# Patient Record
Sex: Female | Born: 1950 | Race: White | Hispanic: No | Marital: Married | Smoking: Never smoker
Health system: Southern US, Community
[De-identification: ages and names within clinical notes are randomized; demographics above are authoritative.]

## PROBLEM LIST (undated history)

## (undated) DIAGNOSIS — G894 Chronic pain syndrome: Secondary | ICD-10-CM

## (undated) DIAGNOSIS — Z930 Tracheostomy status: Secondary | ICD-10-CM

## (undated) DIAGNOSIS — K220 Achalasia of cardia: Secondary | ICD-10-CM

## (undated) DIAGNOSIS — I639 Cerebral infarction, unspecified: Secondary | ICD-10-CM

## (undated) DIAGNOSIS — J8410 Pulmonary fibrosis, unspecified: Secondary | ICD-10-CM

## (undated) DIAGNOSIS — G43909 Migraine, unspecified, not intractable, without status migrainosus: Secondary | ICD-10-CM

## (undated) DIAGNOSIS — M1990 Unspecified osteoarthritis, unspecified site: Secondary | ICD-10-CM

## (undated) DIAGNOSIS — F419 Anxiety disorder, unspecified: Secondary | ICD-10-CM

## (undated) DIAGNOSIS — T4145XA Adverse effect of unspecified anesthetic, initial encounter: Secondary | ICD-10-CM

## (undated) DIAGNOSIS — R112 Nausea with vomiting, unspecified: Secondary | ICD-10-CM

## (undated) DIAGNOSIS — R1310 Dysphagia, unspecified: Secondary | ICD-10-CM

## (undated) DIAGNOSIS — J398 Other specified diseases of upper respiratory tract: Secondary | ICD-10-CM

## (undated) DIAGNOSIS — J449 Chronic obstructive pulmonary disease, unspecified: Secondary | ICD-10-CM

## (undated) DIAGNOSIS — B029 Zoster without complications: Secondary | ICD-10-CM

## (undated) DIAGNOSIS — J45909 Unspecified asthma, uncomplicated: Secondary | ICD-10-CM

## (undated) DIAGNOSIS — R0600 Dyspnea, unspecified: Secondary | ICD-10-CM

## (undated) DIAGNOSIS — Z8719 Personal history of other diseases of the digestive system: Secondary | ICD-10-CM

## (undated) DIAGNOSIS — Z452 Encounter for adjustment and management of vascular access device: Secondary | ICD-10-CM

## (undated) DIAGNOSIS — D649 Anemia, unspecified: Secondary | ICD-10-CM

## (undated) DIAGNOSIS — C649 Malignant neoplasm of unspecified kidney, except renal pelvis: Secondary | ICD-10-CM

## (undated) DIAGNOSIS — R569 Unspecified convulsions: Secondary | ICD-10-CM

## (undated) DIAGNOSIS — J3800 Paralysis of vocal cords and larynx, unspecified: Secondary | ICD-10-CM

## (undated) DIAGNOSIS — R600 Localized edema: Secondary | ICD-10-CM

## (undated) DIAGNOSIS — J309 Allergic rhinitis, unspecified: Secondary | ICD-10-CM

## (undated) DIAGNOSIS — E876 Hypokalemia: Secondary | ICD-10-CM

## (undated) DIAGNOSIS — T17908A Unspecified foreign body in respiratory tract, part unspecified causing other injury, initial encounter: Secondary | ICD-10-CM

## (undated) DIAGNOSIS — K589 Irritable bowel syndrome without diarrhea: Secondary | ICD-10-CM

## (undated) DIAGNOSIS — R011 Cardiac murmur, unspecified: Secondary | ICD-10-CM

## (undated) DIAGNOSIS — M5020 Other cervical disc displacement, unspecified cervical region: Secondary | ICD-10-CM

## (undated) DIAGNOSIS — T7840XA Allergy, unspecified, initial encounter: Secondary | ICD-10-CM

## (undated) DIAGNOSIS — Z91018 Allergy to other foods: Secondary | ICD-10-CM

## (undated) DIAGNOSIS — Z9889 Other specified postprocedural states: Secondary | ICD-10-CM

## (undated) DIAGNOSIS — T8859XA Other complications of anesthesia, initial encounter: Secondary | ICD-10-CM

## (undated) DIAGNOSIS — K769 Liver disease, unspecified: Secondary | ICD-10-CM

## (undated) DIAGNOSIS — K224 Dyskinesia of esophagus: Secondary | ICD-10-CM

## (undated) DIAGNOSIS — K2970 Gastritis, unspecified, without bleeding: Secondary | ICD-10-CM

## (undated) DIAGNOSIS — H269 Unspecified cataract: Secondary | ICD-10-CM

## (undated) HISTORY — PX: BACK SURGERY: SHX140

## (undated) HISTORY — DX: Migraine, unspecified, not intractable, without status migrainosus: G43.909

## (undated) HISTORY — DX: Allergic rhinitis, unspecified: J30.9

## (undated) HISTORY — DX: Irritable bowel syndrome, unspecified: K58.9

## (undated) HISTORY — DX: Allergy, unspecified, initial encounter: T78.40XA

## (undated) HISTORY — DX: Zoster without complications: B02.9

## (undated) HISTORY — PX: ABDOMINAL HYSTERECTOMY: SHX81

## (undated) HISTORY — DX: Unspecified cataract: H26.9

## (undated) HISTORY — DX: Cerebral infarction, unspecified: I63.9

## (undated) HISTORY — DX: Anemia, unspecified: D64.9

## (undated) HISTORY — PX: TUBAL LIGATION: SHX77

## (undated) HISTORY — DX: Unspecified asthma, uncomplicated: J45.909

## (undated) HISTORY — PX: CHOLECYSTECTOMY: SHX55

## (undated) HISTORY — PX: MULTIPLE TOOTH EXTRACTIONS: SHX2053

## (undated) HISTORY — DX: Cardiac murmur, unspecified: R01.1

## (undated) HISTORY — PX: OTHER SURGICAL HISTORY: SHX169

## (undated) HISTORY — PX: COLONOSCOPY: SHX174

## (undated) HISTORY — PX: JEJUNOSTOMY FEEDING TUBE: SUR737

## (undated) HISTORY — PX: OOPHORECTOMY: SHX86

## (undated) HISTORY — PX: EYE SURGERY: SHX253

## (undated) HISTORY — DX: Chronic obstructive pulmonary disease, unspecified: J44.9

## (undated) HISTORY — DX: Dyskinesia of esophagus: K22.4

## (undated) HISTORY — PX: TONSILLECTOMY: SUR1361

## (undated) HISTORY — PX: DIAGNOSTIC LAPAROSCOPY: SUR761

## (undated) HISTORY — PX: BREAST BIOPSY: SHX20

## (undated) HISTORY — DX: Other cervical disc displacement, unspecified cervical region: M50.20

## (undated) HISTORY — PX: SPINE SURGERY: SHX786

---

## 1994-03-27 HISTORY — PX: TRACHEOSTOMY: SUR1362

## 2000-03-27 HISTORY — PX: BREAST SURGERY: SHX581

## 2004-07-04 ENCOUNTER — Inpatient Hospital Stay: Payer: Self-pay | Admitting: Internal Medicine

## 2004-12-30 ENCOUNTER — Ambulatory Visit: Payer: Self-pay | Admitting: Unknown Physician Specialty

## 2005-04-06 ENCOUNTER — Ambulatory Visit: Payer: Self-pay | Admitting: Internal Medicine

## 2006-01-01 ENCOUNTER — Ambulatory Visit: Payer: Self-pay | Admitting: Otolaryngology

## 2006-01-04 ENCOUNTER — Ambulatory Visit: Payer: Self-pay | Admitting: Otolaryngology

## 2006-10-08 ENCOUNTER — Ambulatory Visit: Payer: Self-pay | Admitting: Specialist

## 2006-10-12 ENCOUNTER — Encounter: Payer: Self-pay | Admitting: Specialist

## 2006-10-26 ENCOUNTER — Encounter: Payer: Self-pay | Admitting: Specialist

## 2006-11-26 ENCOUNTER — Encounter: Payer: Self-pay | Admitting: Specialist

## 2007-09-19 ENCOUNTER — Ambulatory Visit: Payer: Self-pay | Admitting: Specialist

## 2007-10-15 ENCOUNTER — Ambulatory Visit: Payer: Self-pay | Admitting: Unknown Physician Specialty

## 2008-05-13 ENCOUNTER — Ambulatory Visit: Payer: Self-pay | Admitting: Specialist

## 2008-08-27 ENCOUNTER — Ambulatory Visit: Payer: Self-pay | Admitting: Unknown Physician Specialty

## 2008-10-15 ENCOUNTER — Ambulatory Visit: Payer: Self-pay | Admitting: Unknown Physician Specialty

## 2008-10-30 ENCOUNTER — Ambulatory Visit: Payer: Self-pay | Admitting: General Surgery

## 2008-12-13 LAB — HM COLONOSCOPY: HM Colonoscopy: NORMAL

## 2009-01-08 ENCOUNTER — Ambulatory Visit: Payer: Self-pay | Admitting: General Surgery

## 2009-01-15 ENCOUNTER — Ambulatory Visit: Payer: Self-pay | Admitting: General Surgery

## 2009-02-07 ENCOUNTER — Emergency Department: Payer: Self-pay | Admitting: Surgery

## 2009-02-08 ENCOUNTER — Ambulatory Visit: Payer: Self-pay | Admitting: General Surgery

## 2009-02-09 ENCOUNTER — Ambulatory Visit: Payer: Self-pay | Admitting: General Surgery

## 2009-10-18 ENCOUNTER — Ambulatory Visit: Payer: Self-pay | Admitting: Unknown Physician Specialty

## 2009-10-20 ENCOUNTER — Ambulatory Visit: Payer: Self-pay | Admitting: Unknown Physician Specialty

## 2009-12-14 ENCOUNTER — Ambulatory Visit: Payer: Self-pay | Admitting: Family Medicine

## 2010-12-08 ENCOUNTER — Ambulatory Visit: Payer: Self-pay | Admitting: Obstetrics & Gynecology

## 2010-12-14 LAB — HM PAP SMEAR

## 2011-05-05 ENCOUNTER — Ambulatory Visit: Payer: Self-pay | Admitting: Specialist

## 2011-06-14 ENCOUNTER — Institutional Professional Consult (permissible substitution): Payer: Self-pay | Admitting: Pulmonary Disease

## 2011-12-14 LAB — HM MAMMOGRAPHY

## 2012-02-26 ENCOUNTER — Ambulatory Visit: Payer: Self-pay | Admitting: Ophthalmology

## 2012-02-26 LAB — POTASSIUM: Potassium: 3.3 mmol/L — ABNORMAL LOW (ref 3.5–5.1)

## 2012-03-27 DIAGNOSIS — H269 Unspecified cataract: Secondary | ICD-10-CM

## 2012-03-27 HISTORY — DX: Unspecified cataract: H26.9

## 2012-04-03 ENCOUNTER — Ambulatory Visit: Payer: Self-pay | Admitting: Ophthalmology

## 2012-04-03 LAB — POTASSIUM: Potassium: 3.6 mmol/L (ref 3.5–5.1)

## 2012-04-08 ENCOUNTER — Ambulatory Visit: Payer: Self-pay | Admitting: Ophthalmology

## 2012-06-25 ENCOUNTER — Ambulatory Visit: Payer: Self-pay | Admitting: Otolaryngology

## 2012-06-25 LAB — T4, FREE: Free Thyroxine: 0.97 ng/dL (ref 0.76–1.46)

## 2012-06-25 LAB — TSH: Thyroid Stimulating Horm: 0.453 u[IU]/mL

## 2012-06-27 ENCOUNTER — Telehealth: Payer: Self-pay | Admitting: Pulmonary Disease

## 2012-06-27 NOTE — Telephone Encounter (Signed)
I spoke with the pt and she states she currently sees pulmonologist in [place] but feels that he is just too busy and feels like it is time to change. She states she has a close friend that is a pt of Dr. Corey Skains and she speaks highly of him so she would like to schedule an appt with him. 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 She wanted to make sure this is a case that Dr. Kendrick Fries would want to take on and would like to set an appt to meet him and discuss her issues and decide if she wants to transfer her care at that time. Appt set for 07-23-12. Carron Curie, CMA

## 2012-07-23 ENCOUNTER — Institutional Professional Consult (permissible substitution): Payer: Self-pay | Admitting: Pulmonary Disease

## 2012-08-05 ENCOUNTER — Ambulatory Visit (INDEPENDENT_AMBULATORY_CARE_PROVIDER_SITE_OTHER): Payer: Medicare Other | Admitting: Pulmonary Disease

## 2012-08-05 ENCOUNTER — Encounter: Payer: Self-pay | Admitting: Pulmonary Disease

## 2012-08-05 VITALS — BP 100/62 | HR 81 | Temp 98.0°F | Ht 63.5 in | Wt 104.0 lb

## 2012-08-05 DIAGNOSIS — Z930 Tracheostomy status: Secondary | ICD-10-CM | POA: Insufficient documentation

## 2012-08-05 DIAGNOSIS — J4489 Other specified chronic obstructive pulmonary disease: Secondary | ICD-10-CM | POA: Insufficient documentation

## 2012-08-05 DIAGNOSIS — J449 Chronic obstructive pulmonary disease, unspecified: Secondary | ICD-10-CM

## 2012-08-05 DIAGNOSIS — Z8709 Personal history of other diseases of the respiratory system: Secondary | ICD-10-CM

## 2012-08-05 DIAGNOSIS — R0602 Shortness of breath: Secondary | ICD-10-CM

## 2012-08-05 DIAGNOSIS — J8410 Pulmonary fibrosis, unspecified: Secondary | ICD-10-CM

## 2012-08-05 NOTE — Patient Instructions (Signed)
We will send you to [facility] for a CT scan to look at the scarring in your lungs  Keep taking your medications as you are doing  We will request records from Dr. Delfin Edis and Dr. Mayo Ao  We will see you back in 4-6 weeks

## 2012-08-05 NOTE — Assessment & Plan Note (Addendum)
" >>  ASSESSMENT AND PLAN FOR ASTHMA-COPD OVERLAP SYNDROME (HCC) WRITTEN ON 08/05/2012  1:03 PM BY Christiane Sistare B, MD  She has been given this diagnosis and takes Symbicort  and Singulair  for it.  It sounds as if she has recurrent episodes of wheezing which would be consistent with a flare of COPD and asthma. This is very difficult to assess, because she has a tracheostomy and pulmonary function testing cannot be performed.  Plan:  -for now continue Symbicort , Singulair , and albuterol  -obtain records from her prior pulmonologist   >>ASSESSMENT AND PLAN FOR PULMONARY FIBROSIS (HCC) WRITTEN ON 08/05/2012  1:02 PM BY Jessenya Berdan B, MD  In addition to COPD, and her vocal cord abnormality it appears that Anne [name] had evidence of pulmonary fibrosis seen on a CT chest performed in February 2013. She is unaware of the workup is been a performed since then. Clearly, we are limited in that we cannot perform pulmonary function testing given her tracheostomy. However it does bother me that she has become more short of breath in the last year and we have this objective abnormality.  She was told that the changes in her lung likely represent congestion or an infection. I hope that this is the case, however I worry that this could represent pulmonary fibrosis such as hypersensitivity pneumonitis, chronic eosinophilic pneumonia, or NSIP.  Plan: -Repeat CT chest now -if evidence of fibrosis, then order hypersensitivity pneumonitis panel as well as fibrosis serologies "

## 2012-08-05 NOTE — Assessment & Plan Note (Signed)
In addition to COPD, and her vocal cord abnormality it appears that Anne [name] had evidence of pulmonary fibrosis seen on a CT chest performed in February 2013. She is unaware of the workup is been a performed since then. Clearly, we are limited in that we cannot perform pulmonary function testing given her tracheostomy. However it does bother me that she has become more short of breath in the last year and we have this objective abnormality.  She was told that the changes in her lung likely represent congestion or an infection. I hope that this is the case, however I worry that this could represent pulmonary fibrosis such as hypersensitivity pneumonitis, chronic eosinophilic pneumonia, or NSIP.  Plan: -Repeat CT chest now -if evidence of fibrosis, then order hypersensitivity pneumonitis panel as well as fibrosis serologies

## 2012-08-05 NOTE — Progress Notes (Signed)
Subjective:    Patient ID: Anne [name], female    DOB: 1950-05-07, 62 y.o.   MRN: [id number]  HPI  This is a very pleasant 62 year old female who comes to our clinic today to establish care for asthma, COPD, questionable pulmonary fibrosis, and a chronic tracheostomy. She says that she felt that she had asthma as a child but she was never diagnosed. She is a never smoker but her mother smoked a lot in the house. She had recurrent episodes of chest tightness and wheezing which are change with various environmental exposures as a child. Throughout adulthood the symptoms worsened and about 20 years ago she started developing worsening shortness of breath. On one visit with the pulmonologist she presented with acute shortness of breath and apparently had stridor. This led to the emergent placement of the tracheostomy. That was 17 years ago and the tracheostomy has stayed since. 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 She's had a capped, cuff was, #4 Shiley in Pl. for what sounds like quite a long time. She changes it once a month and 3-4 times a year she needs to an It at night for shortness of breath. She has flares of asthma 2-3 times a year which are treated with prednisone tapers. She takes Symbicort, albuterol, and Singulair which all help with her shortness of breath. She states that whenever she is exposed to pollen, Clorox, smoke or some perfumes she will get short of breath. She has a dry cough which is rarely productive of sputum.  She tells me that she thinks that her shortness of breath with exertion has gotten worse over the last year. She tries to stay active and exercises regularly but she states that she doesn't feel she has the energy that she used to.   Past Medical History  Diagnosis Date  . Asthma   . COPD (chronic obstructive pulmonary disease)   . Stroke   . Migraine   . Allergic rhinitis      Family History  Problem  Relation Age of Onset  . Asthma Cousin   . COPD Cousin      History   Social History  . Marital Status: Married    Spouse Name: N/A    Number of Children: N/A  . Years of Education: N/A   Occupational History  . Not on file.   Social History Main Topics  . Smoking status: Never Smoker   . Smokeless tobacco: Never Used  . Alcohol Use: No  . Drug Use: No  . Sexually Active: Not on file   Other Topics Concern  . Not on file   Social History Narrative  . No narrative on file     Allergies  Allergen Reactions  . Asa (Aspirin)   . Levaquin (Levofloxacin In D5w)      No outpatient prescriptions prior to visit.   No facility-administered medications prior to visit.      Review of Systems  Constitutional: Positive for unexpected weight change. Negative for fever.  HENT: Positive for sneezing and dental problem. Negative for ear pain, nosebleeds, congestion, sore throat, rhinorrhea, trouble swallowing, postnasal drip and sinus pressure.   Eyes: Negative for redness and itching.  Respiratory: Positive for cough and shortness of breath. Negative for chest tightness and wheezing.   Cardiovascular: Positive for palpitations. Negative for leg swelling.  Gastrointestinal: Positive for abdominal pain. Negative for nausea and vomiting.  Genitourinary: Negative for dysuria.  Musculoskeletal: Positive for  joint swelling.  Skin: Negative for rash.  Neurological: Positive for headaches.  Hematological: Does not bruise/bleed easily.  Psychiatric/Behavioral: Negative for dysphoric mood. The patient is not nervous/anxious.        Objective:   Physical Exam  Filed Vitals:   08/05/12 1012  BP: 100/62  Pulse: 81  Temp: 98 F (36.7 C)  TempSrc: Oral  Height: 5' 3.5" (1.613 m)  Weight: 104 lb (47.174 kg)  SpO2: 99%   Gen: well appearing, no acute distress HEENT: NCAT, PERRL, EOMi, OP clear, trach site c/d/i; #4 capped, cuffless shiley in place PULM: wheezing in upper  lobes bilaterally CV: RRR, no mgr, no JVD AB: BS+, soft, nontender, no hsm Ext: warm, no edema, no clubbing, no cyanosis Derm: no rash or skin breakdown Neuro: A&Ox4, CN II-XII intact, strength 5/5 in all 4 extremities  04/[facility]>> emphysema bilaterally; upper lobe groundglass opacification, interstitial thickening, interlobular thickening, and apical cappingnoted bilaterally. This is significantly worsened since the 2007 comparison study. In       Assessment & Plan:   Postinflammatory pulmonary fibrosis In addition to COPD, and her vocal cord abnormality it appears that [name] had evidence of pulmonary fibrosis seen on a CT chest performed in February 2013. She is unaware of the workup is been a performed since then. Clearly, we are limited in that we cannot perform pulmonary function testing given her tracheostomy. However it does bother me that she has become more short of breath in the last year and we have this objective abnormality.  She was told that the changes in her lung likely represent congestion or an infection. I hope that this is the case, however I worry that this could represent pulmonary fibrosis such as hypersensitivity pneumonitis, chronic eosinophilic pneumonia, or NSIP.  Plan: -Repeat CT chest now -if evidence of fibrosis, then order hypersensitivity pneumonitis panel as well as fibrosis serologies  COPD with asthma She has been given this diagnosis and takes Symbicort and Singulair for it.  It sounds as if she has recurrent episodes of wheezing which would be consistent with a flare of COPD and asthma. This is very difficult to assess, because she has a tracheostomy and pulmonary function testing cannot be performed.  Plan:  -for now continue Symbicort, Singulair, and albuterol -obtain records from her prior pulmonologist  Tracheostomy dependent It is not entirely clear to me why she had the tracheostomy placed 17 years  ago, however it sounds as if it occurred in the midst of an episode of stridor and near respiratory failure. She describes a vocal cord problem with which I am not familiar.  Plan: -Continue tracheostomy changes that she is doing -Obtain records from [facility] for further clarification   Updated Medication List Outpatient Encounter Prescriptions as of 08/05/2012  Medication Sig Dispense Refill  . albuterol (PROVENTIL HFA;VENTOLIN HFA) 108 (90 BASE) MCG/ACT inhaler Inhale 2 puffs into the lungs every 6 (six) hours as needed for wheezing.      [name] ALPRAZolam (XANAX) 0.25 MG tablet Take 0.25 mg by mouth 2 (two) times daily as needed for sleep.      . Artificial Tear Ointment (ARTIFICIAL TEARS) ointment as needed.      [name] azelastine (OPTIVAR) 0.05 % ophthalmic solution Place 1 drop into both eyes 2 (two) times daily.      . budesonide-formoterol (SYMBICORT) 80-4.5 MCG/ACT inhaler Inhale 2 puffs into the lungs 2 (two) times daily.      . calcium  carbonate (OS-CAL) 600 MG TABS Take 600 mg by mouth daily.      . cetirizine (ZYRTEC) 10 MG tablet Take 10 mg by mouth daily.      . chlorpheniramine-HYDROcodone (TUSSIONEX) 10-8 MG/5ML LQCR Take 5 mLs by mouth every 12 (twelve) hours as needed.      . clopidogrel (PLAVIX) 75 MG tablet Take 75 mg by mouth daily.      . Cyanocobalamin (VITAMIN B-12 IJ) Inject as directed every 30 (thirty) days.      [name] estrogens, conjugated, (PREMARIN) 0.625 MG tablet Take 0.625 mg by mouth daily. Take daily for 21 days then do not take for 7 days.      . fluticasone (FLONASE) 50 MCG/ACT nasal spray Place 2 sprays into the nose daily.      . furosemide (LASIX) 20 MG tablet Take 20 mg by mouth daily.      . magnesium oxide (MAG-OX) 400 MG tablet Take 400 mg by mouth daily.      . montelukast (SINGULAIR) 10 MG tablet Take 10 mg by mouth at bedtime.      . potassium chloride SA (K-DUR,KLOR-CON) 20 MEQ tablet Take 20 mEq by mouth 2 (two) times daily as needed.      . sodium  chloride (MURO 128) 5 % ophthalmic ointment 1 drop.       No facility-administered encounter medications on file as of 08/05/2012.

## 2012-08-05 NOTE — Assessment & Plan Note (Signed)
It is not entirely clear to me why she had the tracheostomy placed 17 years ago, however it sounds as if it occurred in the midst of an episode of stridor and near respiratory failure. She describes a vocal cord problem with which I am not familiar.  Plan: -Continue tracheostomy changes that she is doing -Obtain records from [facility] for further clarification

## 2012-08-06 ENCOUNTER — Encounter: Payer: Self-pay | Admitting: Pulmonary Disease

## 2012-08-08 ENCOUNTER — Telehealth: Payer: Self-pay | Admitting: Pulmonary Disease

## 2012-08-08 ENCOUNTER — Ambulatory Visit: Payer: Self-pay | Admitting: Pulmonary Disease

## 2012-08-08 NOTE — Telephone Encounter (Signed)
Received 20 pages from Dr. Meredeth Ide at [facility] on 08/08/2012 asw  Sent 20 pages to Dr. Kendrick Fries to review on 08/08/2012 asw

## 2012-08-08 NOTE — Telephone Encounter (Signed)
Dr Kendrick Fries, I have not received the results yet, but can you view on the [facility] pacs system and advise on results please? Thanks!

## 2012-08-12 NOTE — Telephone Encounter (Signed)
Last OV 08-05-12. Pt was calling for 2 things: 1) CT results, I advised we have a message into Dr. Kendrick Fries for this and he is in the office tomorrow so she should get a call them.  2) Pt c/o having increased chest congestion, body aches, headache, dry cough, chest congestion, wheezing, SOB and pain in back at "lungs" x 2 days, no appts available in [place]. Pt states she cannot take tablets well so she requests liquid RX.  Please advise. Carron Curie, CMA Allergies  Allergen Reactions  . Asa (Aspirin)   . Levaquin (Levofloxacin In D5w)

## 2012-08-12 NOTE — Telephone Encounter (Signed)
Spoke with pt and have scheduled her to see Dr. Kendrick Fries tomorrow at 10:45 am I advised that should her symptoms persist/worsen seek emergent care sooner and she verbalized understanding

## 2012-08-12 NOTE — Telephone Encounter (Signed)
Can't treat this many problems over the phone - can try mucinex dm 1200 mg bid but need to get her here this pm or in am in [facility] 5/20

## 2012-08-12 NOTE — Telephone Encounter (Signed)
Pt calling again for ct results also c/o sob, chest congestion, says it hurts to breathe, also very achy x2 days please advise.//[facility] [facility].Raylene Everts

## 2012-08-12 NOTE — Telephone Encounter (Signed)
[email or web address].Roniel Halloran, CMA  

## 2012-08-13 ENCOUNTER — Ambulatory Visit (INDEPENDENT_AMBULATORY_CARE_PROVIDER_SITE_OTHER): Payer: Medicare Other | Admitting: Pulmonary Disease

## 2012-08-13 ENCOUNTER — Encounter: Payer: Self-pay | Admitting: Pulmonary Disease

## 2012-08-13 VITALS — BP 102/60 | HR 98 | Temp 98.1°F | Ht 63.5 in | Wt 103.8 lb

## 2012-08-13 DIAGNOSIS — J8410 Pulmonary fibrosis, unspecified: Secondary | ICD-10-CM

## 2012-08-13 DIAGNOSIS — D649 Anemia, unspecified: Secondary | ICD-10-CM | POA: Insufficient documentation

## 2012-08-13 DIAGNOSIS — R918 Other nonspecific abnormal finding of lung field: Secondary | ICD-10-CM | POA: Insufficient documentation

## 2012-08-13 DIAGNOSIS — J449 Chronic obstructive pulmonary disease, unspecified: Secondary | ICD-10-CM

## 2012-08-13 LAB — CBC WITH DIFFERENTIAL/PLATELET
Basophils Absolute: 0 10*3/uL (ref 0.0–0.1)
Basophils Relative: 0.2 % (ref 0.0–3.0)
Eosinophils Absolute: 0.1 10*3/uL (ref 0.0–0.7)
Eosinophils Relative: 1.2 % (ref 0.0–5.0)
HCT: 33.3 % — ABNORMAL LOW (ref 36.0–46.0)
Hemoglobin: 11.4 g/dL — ABNORMAL LOW (ref 12.0–15.0)
Lymphocytes Relative: 11.6 % — ABNORMAL LOW (ref 12.0–46.0)
Lymphs Abs: 1 10*3/uL (ref 0.7–4.0)
MCHC: 34.2 g/dL (ref 30.0–36.0)
MCV: 93.3 fl (ref 78.0–100.0)
Monocytes Absolute: 0.4 10*3/uL (ref 0.1–1.0)
Monocytes Relative: 5.2 % (ref 3.0–12.0)
Neutro Abs: 6.8 10*3/uL (ref 1.4–7.7)
Neutrophils Relative %: 81.8 % — ABNORMAL HIGH (ref 43.0–77.0)
Platelets: 249 10*3/uL (ref 150.0–400.0)
RBC: 3.57 Mil/uL — ABNORMAL LOW (ref 3.87–5.11)
RDW: 13.4 % (ref 11.5–14.6)
WBC: 8.4 10*3/uL (ref 4.5–10.5)

## 2012-08-13 LAB — SEDIMENTATION RATE: Sed Rate: 55 mm/hr — ABNORMAL HIGH (ref 0–22)

## 2012-08-13 LAB — C-REACTIVE PROTEIN: CRP: 10.6 mg/dL (ref 0.5–20.0)

## 2012-08-13 MED ORDER — PREDNISONE 10 MG PO TABS: ORAL_TABLET | ORAL | Status: DC

## 2012-08-13 MED ORDER — AMOXICILLIN-POT CLAVULANATE 875-125 MG PO TABS: 1.0000 | ORAL_TABLET | Freq: Two times a day (BID) | ORAL | Status: AC

## 2012-08-13 NOTE — Assessment & Plan Note (Signed)
See the description of the chest CT of the above. She has clusters of pulmonary nodules in the right lower lobe which I think represent either aspiration pneumonitis, or mucus plugging related to an infection. Much less likely would be a fungal infectio.  Plan: -Treat with antibiotics and repeat CT scan in 8-12 weeks -Check serologies for ABPA and cryptococcosis

## 2012-08-13 NOTE — Patient Instructions (Signed)
Take the augmentin as written Take the prednisone as written  We will see you back in 3 weeks to see how you are doing.  Let us know if you are not better before then  You will need a repeat CT scan of your chest in 2-3 months (8-12 weeks) and a Chest X-ray with the next visit.

## 2012-08-13 NOTE — Progress Notes (Signed)
Subjective:    Patient ID: Anne [name], female    DOB: 1950/12/05, 62 y.o.   MRN: [id number]  Synopsis: She enjoys establish care with the [facility] pulmonary clinic in may of 2014. 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 A CT scan in 2013 performed at [facility] showed some groundglass changes, interstitial thickening, and interlobular thickening in the upper lobes predominantly.  HPI  08/13/2012 ROV >> Anne [name] has had chest congestion, cough, and increasing shortness of breath since Saturday. She has been taking Mucinex for this but it does not help. This is been associated with muscle aches, and fatigue. She has multiple joint aches, and states that her ring finger PIP has been swollen for the last several weeks. She also notes a new rash on her face which is red. She came to our clinic today as a sick visit but also would like to followup the results of the CT scan which was performed last week.  Past Medical History  Diagnosis Date  . Asthma   . COPD (chronic obstructive pulmonary disease)   . Stroke   . Migraine   . Allergic rhinitis      Review of Systems  Constitutional: Positive for chills and fatigue. Negative for fever.  HENT: Negative for congestion, rhinorrhea and postnasal drip.   Respiratory: Positive for cough, chest tightness and shortness of breath.   Cardiovascular: Negative for chest pain, palpitations and leg swelling.       Objective:   Physical Exam Filed Vitals:   08/13/12 1108  BP: 102/60  Pulse: 98  Temp: 98.1 F (36.7 C)  TempSrc: Oral  Height: 5' 3.5" (1.613 m)   Weight: 103 lb 12.8 oz (47.083 kg)  SpO2: 99%   Gen:  no acute distress HEENT: NCAT, EOMi, OP clear, trach site c/d/i, no drainage PULM: exp wheezing bilaterally worse in upper lobes, few crackls CV: RRR, no mgr, no JVD AB: BS+, soft, nontender, no hsm Ext: warm, no edema, no clubbing, no cyanosis Derm: erythematous rash over cheeks bilaterally MSK: no joint effusion in hands, elbows, ankles, or knees  04/2011 CT [facility] at [facility]>> emphysema bilaterally; upper lobe groundglass opacification, interstitial thickening, interlobular thickening, and apical cappingnoted bilaterally. This is significantly worsened since the 2007 comparison study. 07/2012 CT chest [facility] >> upper lobe interstitial thickening, consolidation, peripepheral and apical in distribution slightly increased from prior; also new clusters of ggo nodules in the RLL      Assessment & Plan:   COPD with asthma She is having a flare of her COPD, though I question whether or not she could have aspiration pneumonia versus pneumonitis.  Plan: -Augmentin for 7 days -Prednisone -Continue inhalers as written  Postinflammatory pulmonary fibrosis Her fibrosis appears to have progressed somewhat since the last study which was performed in 2013. Given the fact that it is an upper lobe predominant disease makes me consider chronic eosinophilic pneumonitis versus hypersensitivity pneumonitis. I also questioned whether or not there is an underlying connective tissue disease which could be contributing as she has multiple myalgias, arthralgias, and an erythematous rash on her face.  Plan: -  Serology to be sent today for hypersensitivity pneumonitis as well as connective tissue diseases. -Also send CBC with differential to look for elevated eosinophil count  Multiple pulmonary nodules See the description of the chest CT of the above. She has clusters of pulmonary nodules in the right lower lobe which I think  represent either aspiration pneumonitis, or mucus plugging related to an infection. Much less likely would be a fungal infectio.  Plan: -Treat with antibiotics and repeat CT scan in 8-12 weeks -Check serologies for ABPA and cryptococcosis   Updated Medication List Outpatient Encounter Prescriptions as of 08/13/2012  Medication Sig Dispense Refill  . albuterol (PROVENTIL HFA;VENTOLIN HFA) 108 (90 BASE) MCG/ACT inhaler Inhale 2 puffs into the lungs every 6 (six) hours as needed for wheezing.      [name] ALPRAZolam (XANAX) 0.25 MG tablet Take 0.25 mg by mouth 2 (two) times daily as needed for sleep.      . Artificial Tear Ointment (ARTIFICIAL TEARS) ointment as needed.      [name] azelastine (OPTIVAR) 0.05 % ophthalmic solution Place 1 drop into both eyes 2 (two) times daily.      . cetirizine (ZYRTEC) 10 MG tablet Take 10 mg by mouth daily.      . chlorpheniramine-HYDROcodone (TUSSIONEX) 10-8 MG/5ML LQCR Take 5 mLs by mouth every 12 (twelve) hours as needed.      . clopidogrel (PLAVIX) 75 MG tablet Take 75 mg by mouth daily.      . Cyanocobalamin (VITAMIN B-12 IJ) Inject as directed every 30 (thirty) days.      [name] estrogens, conjugated, (PREMARIN) 0.625 MG tablet Take 0.625 mg by mouth daily. Take daily for 21 days then do not take for 7 days.      . fluticasone (FLONASE) 50 MCG/ACT nasal spray Place 2 sprays into the nose as needed.       . furosemide (LASIX) 20 MG tablet Take 20 mg by mouth daily.      . montelukast (SINGULAIR) 10 MG tablet Take 10 mg by mouth at bedtime.      [name] amoxicillin-clavulanate (AUGMENTIN) 875-125 MG per tablet Take 1 tablet by mouth 2 (two) times daily.  28 tablet  0  . predniSONE (DELTASONE) 10 MG tablet 30mg  daily for 3 days, then 20mg  daily for 3 days, then off  30 tablet  0  . [DISCONTINUED] budesonide-formoterol (SYMBICORT) 80-4.5 MCG/ACT inhaler Inhale 2 puffs into the lungs 2 (two) times daily.      . [DISCONTINUED] calcium carbonate (OS-CAL) 600 MG TABS Take 600 mg by  mouth daily.      . [DISCONTINUED] magnesium oxide (MAG-OX) 400 MG tablet Take 400 mg by mouth daily.      . [DISCONTINUED] potassium chloride SA (K-DUR,KLOR-CON) 20 MEQ tablet Take 20 mEq by mouth 2 (two) times daily as needed.      . [DISCONTINUED] sodium chloride (MURO 128) 5 % ophthalmic ointment 1 drop.       No facility-administered encounter medications on file as of 08/13/2012.

## 2012-08-13 NOTE — Assessment & Plan Note (Addendum)
" >>  ASSESSMENT AND PLAN FOR ASTHMA-COPD OVERLAP SYNDROME (HCC) WRITTEN ON 08/13/2012  1:17 PM BY Micky Sheller B, MD  She is having a flare of her COPD, though I question whether or not she could have aspiration pneumonia versus pneumonitis.  Plan: -Augmentin  for 7 days -Prednisone  -Continue inhalers as written   >>ASSESSMENT AND PLAN FOR PULMONARY FIBROSIS (HCC) WRITTEN ON 08/13/2012  1:18 PM BY Judie Hollick B, MD  Her fibrosis appears to have progressed somewhat since the last study which was performed in 2013. Given the fact that it is an upper lobe predominant disease makes me consider chronic eosinophilic pneumonitis versus hypersensitivity pneumonitis. I also questioned whether or not there is an underlying connective tissue disease which could be contributing as she has multiple myalgias, arthralgias, and an erythematous rash on her face.  Plan: -Serology to be sent today for hypersensitivity pneumonitis as well as connective tissue diseases. -Also send CBC with differential to look for elevated eosinophil count "

## 2012-08-13 NOTE — Assessment & Plan Note (Signed)
Her fibrosis appears to have progressed somewhat since the last study which was performed in 2013. Given the fact that it is an upper lobe predominant disease makes me consider chronic eosinophilic pneumonitis versus hypersensitivity pneumonitis. I also questioned whether or not there is an underlying connective tissue disease which could be contributing as she has multiple myalgias, arthralgias, and an erythematous rash on her face.  Plan: -Serology to be sent today for hypersensitivity pneumonitis as well as connective tissue diseases. -Also send CBC with differential to look for elevated eosinophil count

## 2012-08-14 ENCOUNTER — Encounter: Payer: Self-pay | Admitting: Pulmonary Disease

## 2012-08-14 LAB — IGE: IgE (Immunoglobulin E), Serum: 3.1 IU/mL (ref 0.0–180.0)

## 2012-08-14 LAB — ANTI-SCLERODERMA ANTIBODY: Scleroderma (Scl-70) (ENA) Antibody, IgG: <1 AU/mL (ref <30)

## 2012-08-14 LAB — ANA: Anti Nuclear Antibody(ANA): NEGATIVE

## 2012-08-14 LAB — RHEUMATOID FACTOR: Rhuematoid fact SerPl-aCnc: <10 IU/mL (ref <=14)

## 2012-08-14 LAB — ANTI-JO 1 ANTIBODY, IGG: Anti JO-1: <0.2 AI (ref 0.0–0.9)

## 2012-08-14 LAB — SJOGRENS SYNDROME-B EXTRACTABLE NUCLEAR ANTIBODY: SSB (La) (ENA) Antibody, IgG: <1 AU/mL (ref <30)

## 2012-08-14 LAB — CRYPTOCOCCAL ANTIGEN: Crypto Ag: NEGATIVE

## 2012-08-14 LAB — SJOGRENS SYNDROME-A EXTRACTABLE NUCLEAR ANTIBODY: SSA (Ro) (ENA) Antibody, IgG: 4 AU/mL (ref <30)

## 2012-08-15 LAB — ALDOLASE: Aldolase: 3.7 U/L (ref <=8.1)

## 2012-08-17 LAB — ASPERGILLUS ANTIBODY (COMPLEMENT FIX): Aspergillus Antibody by Complement Fix: <1:8 {titer}

## 2012-08-19 LAB — ASPERGILLUS ANTIBODY BY IMMUNODIFF: Aspergillus Antibody ID: NEGATIVE

## 2012-08-20 LAB — HYPERSENSITIVITY PNUEMONITIS PROFILE

## 2012-08-21 ENCOUNTER — Encounter: Payer: Self-pay | Admitting: Pulmonary Disease

## 2012-08-22 ENCOUNTER — Telehealth: Payer: Self-pay | Admitting: Pulmonary Disease

## 2012-08-22 LAB — ASPERGILLUS IGE PANEL

## 2012-08-22 NOTE — Telephone Encounter (Signed)
i spoke with pt. She stated she wants to know if it is okay to use her albuterol neb. i advised her directions are every 6 hrs prn. She did not want to make an appt at this time. She stated if the albuterol neb did not help then she will call back for appt. Nothing further was needed

## 2012-08-28 ENCOUNTER — Telehealth: Payer: Self-pay | Admitting: Pulmonary Disease

## 2012-08-28 ENCOUNTER — Encounter: Payer: Self-pay | Admitting: Pulmonary Disease

## 2012-08-28 DIAGNOSIS — R918 Other nonspecific abnormal finding of lung field: Secondary | ICD-10-CM

## 2012-08-28 NOTE — Telephone Encounter (Signed)
Spoke with pt.  She is aware cxr order placed for [facility] office to have done prior to Tuesday's OV with BQ.   BQ ordered labs on pt on 08/13/12.  A CBC was ordered.  Pt has looked at these results in MyChart and is concerned about being anemic given her RBC, Hgb, and Hct levels.   RBC = 3.5 Hgb = 11.4 Hct 33.3  Pt states she doesn't have a PCP dr right now.  She would like BQ to address this as he ordered them and she doesn't have a PCP.  Would like to know if she needs to pick up something to take?  BQ, pls advise.  Thank you.  ** Pt aware BQ is night float.

## 2012-08-28 NOTE — Telephone Encounter (Signed)
She needs a PCP. Tell her to call the [facility] office and ask for an appointment with one of the providers.  Her anemia is mild and similar to other values I saw from her records from [place].  She needs further labs to work it up, but it is not emergent and can be handled by her new PCP.  At this point what I would recommend is a repeat cbc within 3 months at a minimum.

## 2012-08-28 NOTE — Telephone Encounter (Signed)
Called, spoke with pt.  She was seen by BQ on 08/13/12 with the following pt instructions:  Patient Instructions    Take the augmentin as written  Take the prednisone as written  We will see you back in 3 weeks to see how you are doing. Let us know if you are not better before then  You will need a repeat CT scan of your chest in 2-3 months (8-12 weeks) and a Chest X   -----  She has a pending OV with BQ on June 10 in [place]. She would like to have cxr done prior to Tuesday's OV so results will be ready and can be discussed during OV.  I spoke with Verlon Au.  Ok for pt to have this done prior to OV at [facility] office.  I have placed order for this and lmomtcb to inform pt.

## 2012-08-29 NOTE — Telephone Encounter (Signed)
lmomtcb x1 

## 2012-08-29 NOTE — Telephone Encounter (Signed)
Pt aware. Nothing further was needed 

## 2012-08-30 ENCOUNTER — Ambulatory Visit (INDEPENDENT_AMBULATORY_CARE_PROVIDER_SITE_OTHER)
Admission: RE | Admit: 2012-08-30 | Discharge: 2012-08-30 | Disposition: A | Discharge status: Home / Self Care | Payer: Medicare Other | Source: Ambulatory Visit | Attending: Pulmonary Disease | Admitting: Pulmonary Disease

## 2012-08-30 DIAGNOSIS — R918 Other nonspecific abnormal finding of lung field: Secondary | ICD-10-CM

## 2012-09-02 ENCOUNTER — Encounter: Payer: Self-pay | Admitting: Pulmonary Disease

## 2012-09-03 ENCOUNTER — Ambulatory Visit (INDEPENDENT_AMBULATORY_CARE_PROVIDER_SITE_OTHER): Payer: Medicare Other | Admitting: Pulmonary Disease

## 2012-09-03 ENCOUNTER — Encounter: Payer: Self-pay | Admitting: Pulmonary Disease

## 2012-09-03 VITALS — BP 100/62 | HR 74 | Temp 97.7°F | Ht 63.5 in | Wt 101.0 lb

## 2012-09-03 DIAGNOSIS — R918 Other nonspecific abnormal finding of lung field: Secondary | ICD-10-CM

## 2012-09-03 DIAGNOSIS — D649 Anemia, unspecified: Secondary | ICD-10-CM

## 2012-09-03 DIAGNOSIS — J690 Pneumonitis due to inhalation of food and vomit: Secondary | ICD-10-CM

## 2012-09-03 DIAGNOSIS — J8410 Pulmonary fibrosis, unspecified: Secondary | ICD-10-CM

## 2012-09-03 LAB — FERRITIN: Ferritin: 30.2 ng/mL (ref 10.0–291.0)

## 2012-09-03 NOTE — Progress Notes (Signed)
Subjective:    Patient ID: Anne [name], female    DOB: [birth year], 62 y.o.   MRN: [id number]  Synopsis: She enjoys establish care with the [facility] pulmonary clinic in may of 2014. 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 A CT scan in 2013 performed at [facility] showed some groundglass changes, interstitial thickening, and interlobular thickening in the upper lobes predominantly.  HPI   08/13/2012 ROV >> Anne [name] has had chest congestion, cough, and increasing shortness of breath since Saturday. She has been taking Mucinex for this but it does not help. This is been associated with muscle aches, and fatigue. She has multiple joint aches, and states that her ring finger PIP has been swollen for the last several weeks. She also notes a new rash on her face which is red. She came to our clinic today as a sick visit but also would like to followup the results of the CT scan which was performed last week.  09/03/2012 ROV >> Anne [name] has been doing well since our last visit. After taking the antibiotic her cough went away. She still occasionally has a dry cough with singing and while eating meals. She has noted a significant amount of aspiration in the last several months. She has had chronic difficulty with swallowing. She often feels things like pills in certain foods getting stuck in her chest. She continues to get B12 shots and has done this for years. Her weight has been stable lately. She is continuing to use her medications as documented below. She has not been bleeding from anywhere as far she can tell.   Past  Medical History  Diagnosis Date  . Asthma   . COPD (chronic obstructive pulmonary disease)   . Stroke   . Migraine   . Allergic rhinitis      Review of Systems  Constitutional: Negative for fever, chills and fatigue.  HENT: Negative for congestion, rhinorrhea and postnasal drip.   Respiratory: Positive for shortness of breath. Negative for cough and chest tightness.   Cardiovascular: Negative for chest pain, palpitations and leg swelling.       Objective:   Physical Exam  Filed Vitals:   09/03/12 0944  BP: 100/62  Pulse: 74  Temp: 97.7 F (36.5 C)  TempSrc: Oral  Height: 5' 3.5" (1.613 m)  Weight: 101 lb (45.813 kg)  SpO2: 99%   Gen:  no acute distress HEENT: NCAT, EOMi, OP clear,  PULM: exp wheezing bilaterally improved today, no crackels CV: RRR, no mgr, no JVD AB: BS+, soft, nontender, no hsm Ext: warm, no edema, no clubbing, no cyanosis   04/2011 CT [facility] at [facility]>> emphysema bilaterally; upper lobe groundglass opacification, interstitial thickening, interlobular thickening, and apical cappingnoted bilaterally. This is significantly worsened since the 2007 comparison study. 07/2012 CT chest [facility] >> upper lobe interstitial thickening, consolidation, peripepheral and apical in distribution slightly increased from prior; also new clusters of ggo nodules in the RLL 07/2012 ANA neg, RF < 10, ESR 55 (high), CRP 10.6, IgE 3.1 (normal), SSB < 1, SSA 4 normal, SCL-70 neg, Crypto Ag neg, Anti-Jo-1 <0.2; CBC with  diff > mild anemia, no eosinophilia 07/2012 HP panel negative 07/2012 Aspergillus Ab panel/precipitins negative 08/2012 1300 feet rest HR 72, O2 97%, 6 min HR 119, O2 sat 88%     Assessment & Plan:   Aspiration pneumonia She has nodules in the RLL which I believe is due to aspiration. She knows that she has been aspirating more lately.  Her aspiration problems are complex with what sounds like esophageal peristalsis issues as well as her  vocal cord paresis.  Plan: -I advised that she go back to her speech therapist to review swallowing technique  Multiple pulmonary nodules Most likely due to aspiration.  Now s/p antibiotics and doing well.    Plan: -repeat CT chest the end of July -if no change, will need bronch  Postinflammatory pulmonary fibrosis Serologic work up for inflammatory diseases has been completely normal.  DDx remains unchanged from previous notes (chronic eosphilic pneumonia, hypersensitivity pneumonitis, NSIP).  With a normal peripheral eosinophil count, chronic eosinophilic pneumonia is less likely, however.  Based on the chronicity and her reasonable functional status, I doubt that an open lung biopsy is merrited.  However if she needs a bronch for the nodules then we may want to take transbronchial biopsies of the upper lobes at the same time.  Plan: -6 MW today for baseline -f/u repeat CT chest July 2014   Anemia She currently does not have a PCP.  The normocytic anemia is likely related to malnutrition, but she apparently has previously been diagnosed with B12 deficiency.  Plan: -check Ferritin and B12 labs    Updated Medication List Outpatient Encounter Prescriptions as of 09/03/2012  Medication Sig Dispense Refill  . albuterol (PROVENTIL HFA;VENTOLIN HFA) 108 (90 BASE) MCG/ACT inhaler Inhale 2 puffs into the lungs every 6 (six) hours as needed for wheezing.      [name] albuterol (PROVENTIL) (2.5 MG/3ML) 0.083% nebulizer solution Take 2.5 mg by nebulization every 6 (six) hours as needed for wheezing.      [name] ALPRAZolam (XANAX) 0.25 MG tablet Take 0.25 mg by mouth 2 (two) times daily as needed for sleep.      . Artificial Tear Ointment (ARTIFICIAL TEARS) ointment as needed.      [name] azelastine (OPTIVAR) 0.05 % ophthalmic solution Place 1 drop into both eyes 2 (two) times daily.      . cefdinir (OMNICEF) 250 MG/5ML suspension Take 10 mLs by mouth daily.      . cetirizine (ZYRTEC) 10 MG tablet Take 10  mg by mouth daily.      . chlorpheniramine-HYDROcodone (TUSSIONEX) 10-8 MG/5ML LQCR Take 5 mLs by mouth every 12 (twelve) hours as needed.      . clopidogrel (PLAVIX) 75 MG tablet Take 75 mg by mouth daily.      . Cyanocobalamin (VITAMIN B-12 IJ) Inject as directed every 30 (thirty) days.      [name] estrogens, conjugated, (PREMARIN) 0.625 MG tablet Take 0.625 mg by mouth daily. Take daily for 21 days then do not take for 7 days.      . fluticasone (FLONASE) 50 MCG/ACT nasal spray Place 2 sprays into the nose as needed.       . furosemide (LASIX) 20 MG tablet Take 20 mg by mouth daily.      . montelukast (SINGULAIR) 10 MG tablet Take 10 mg by mouth at bedtime.      . [DISCONTINUED] predniSONE (DELTASONE) 10 MG tablet 30mg  daily for 3 days, then 20mg  daily for 3 days, then off  30 tablet  0   No facility-administered encounter medications on file as of 09/03/2012.

## 2012-09-03 NOTE — Assessment & Plan Note (Signed)
" >>  ASSESSMENT AND PLAN FOR PULMONARY FIBROSIS (HCC) WRITTEN ON 09/03/2012 12:01 PM BY Noeh Sparacino B, MD  Serologic work up for inflammatory diseases has been completely normal.  DDx remains unchanged from previous notes (chronic eosphilic pneumonia, hypersensitivity pneumonitis, NSIP).  With a normal peripheral eosinophil count, chronic eosinophilic pneumonia is less likely, however.  Based on the chronicity and her reasonable functional status, I doubt that an open lung biopsy is merrited.  However if she needs a bronch for the nodules then we may want to take transbronchial biopsies of the upper lobes at the same time.  Plan: -6 MW today for baseline -f/u repeat CT chest July 2014  "

## 2012-09-03 NOTE — Assessment & Plan Note (Signed)
Serologic work up for inflammatory diseases has been completely normal.  DDx remains unchanged from previous notes (chronic eosphilic pneumonia, hypersensitivity pneumonitis, NSIP).  With a normal peripheral eosinophil count, chronic eosinophilic pneumonia is less likely, however.  Based on the chronicity and her reasonable functional status, I doubt that an open lung biopsy is merrited.  However if she needs a bronch for the nodules then we may want to take transbronchial biopsies of the upper lobes at the same time.  Plan: -6 MW today for baseline -f/u repeat CT chest July 2014

## 2012-09-03 NOTE — Assessment & Plan Note (Signed)
She has nodules in the RLL which I believe is due to aspiration. She knows that she has been aspirating more lately.  Her aspiration problems are complex with what sounds like esophageal peristalsis issues as well as her vocal cord paresis.  Plan: -I advised that she go back to her speech therapist to review swallowing technique

## 2012-09-03 NOTE — Assessment & Plan Note (Addendum)
Most likely due to aspiration.  Now s/p antibiotics and doing well.    Plan: -repeat CT chest the end of July -if no change, will need bronch

## 2012-09-03 NOTE — Assessment & Plan Note (Signed)
She currently does not have a PCP.  The normocytic anemia is likely related to malnutrition, but she apparently has previously been diagnosed with B12 deficiency.  Plan: -check Ferritin and B12 labs

## 2012-09-03 NOTE — Patient Instructions (Signed)
We will call you with the results of your blood work for anemia Follow up with Dr. Dan Humphreys  We will make sure you have a CT scan planned for late July  You should schedule another visit with your speech pathologist to review your swallowing.  If you need a referral for this we are happy to provide it.  We will see you back after your CT scan

## 2012-09-04 ENCOUNTER — Telehealth: Payer: Self-pay | Admitting: Pulmonary Disease

## 2012-09-04 DIAGNOSIS — J690 Pneumonitis due to inhalation of food and vomit: Secondary | ICD-10-CM

## 2012-09-04 LAB — IRON AND TIBC
%SAT: 34 % (ref 20–55)
Iron: 149 ug/dL — ABNORMAL HIGH (ref 42–145)
TIBC: 440 ug/dL (ref 250–470)
UIBC: 291 ug/dL (ref 125–400)

## 2012-09-04 NOTE — Telephone Encounter (Signed)
lmomtcb x1 for pt 

## 2012-09-04 NOTE — Telephone Encounter (Signed)
I called pt. She stated she did not call our office for this. She stated she did not need anything from [place].  I will sign off note.

## 2012-09-05 NOTE — Telephone Encounter (Signed)
Spoke with the pt She states was supposed to have referral for speech therapy at [facility]  Order never sent I will send to [facility] now Nothing further needed per pt

## 2012-09-05 NOTE — Telephone Encounter (Signed)
LMOM TCB x2

## 2012-09-06 ENCOUNTER — Other Ambulatory Visit: Payer: Self-pay | Admitting: Pulmonary Disease

## 2012-09-06 ENCOUNTER — Telehealth: Payer: Self-pay | Admitting: *Deleted

## 2012-09-06 DIAGNOSIS — J690 Pneumonitis due to inhalation of food and vomit: Secondary | ICD-10-CM

## 2012-09-06 LAB — METHYLMALONIC ACID, SERUM: Methylmalonic Acid, Quant: 0.13 umol/L (ref <0.40)

## 2012-09-06 NOTE — Telephone Encounter (Signed)
Message copied by Christen Butter on Fri Sep 06, 2012  3:12 PM ------      Message from: Shelton Silvas E      Created: Thu Sep 05, 2012  4:11 PM       Verlon Au [email or web address]  said pt has to have a modified barium swallow before they will evaluate for speech thanks libby       ------

## 2012-09-06 NOTE — Telephone Encounter (Signed)
I spoke with the pt and let her know that speech therapist will eval her speech, she needs to have MBS She wishes to discuss this with her spouse before we order this She can call us back if she wants to set this up I will forward this to Dr Kendrick Fries, so that he is aware

## 2012-09-09 ENCOUNTER — Encounter: Payer: Self-pay | Admitting: Pulmonary Disease

## 2012-09-19 ENCOUNTER — Telehealth: Payer: Self-pay | Admitting: Pulmonary Disease

## 2012-09-19 DIAGNOSIS — R918 Other nonspecific abnormal finding of lung field: Secondary | ICD-10-CM

## 2012-09-19 DIAGNOSIS — J8410 Pulmonary fibrosis, unspecified: Secondary | ICD-10-CM

## 2012-09-19 NOTE — Telephone Encounter (Signed)
Before I call her back we need to know the status of her CT Chest This was ordered back on 08/05/12 to be done in July and no one has contacted the pt  [name], please advise thanks

## 2012-09-20 ENCOUNTER — Telehealth: Payer: Self-pay | Admitting: Pulmonary Disease

## 2012-09-20 NOTE — Telephone Encounter (Signed)
lmomtcb x1 

## 2012-09-20 NOTE — Telephone Encounter (Signed)
Pt will need to have f/u scheduled with Dr. Kendrick Fries after the CT Chest. Please contact patient to arrange. Rhonda J Cobb

## 2012-09-20 NOTE — Telephone Encounter (Signed)
The order that was placed on 08/05/12, pt had CT Chest on 08/08/12 at [facility]. Therefore, new referral will need to be placed for the CT Chest for July. I have scheduled this at [facility] for Tues 10/15/12 at 9:30 am at the [facility]. Pt will need to arrive at 9:15 and bring a current list of meds. I contacted patient to advise of this appointment and received patient's voice mail. Left appointment date, time and location on her voice mail with instructions to contact me at [phone] if she has any questions. Rhonda J Cobb

## 2012-09-20 NOTE — Telephone Encounter (Signed)
Order for CT placed

## 2012-09-23 NOTE — Telephone Encounter (Signed)
I spoke with pt. She is wanting a maintenance inhaler. Per pt she has already requested symbicort (not sure of dosage) from ehr pharmacy that another provided gave her in the apst. She has not picked this up yet. She is aware Dr. Kendrick Fries is out of the office until tomorrow. Please advise thanks  Allergies  Allergen Reactions  . Shellfish Allergy Anaphylaxis  . Asa (Aspirin)   . Eggs Or Egg-Derived Products     GI Upset  . Levaquin (Levofloxacin In D5w)

## 2012-09-23 NOTE — Telephone Encounter (Signed)
appt scheduled for 10/22/12. Nothing further was needed

## 2012-09-23 NOTE — Telephone Encounter (Signed)
LMOM TCB x1 for clarification from pt and offer BQ's recs below.

## 2012-09-23 NOTE — Telephone Encounter (Signed)
I'm not sure I understand.  Does she need an Rx for Symbicort?  Does she need an alternative? If symbicort, give her 80/4.5 2 bid If an alternative, due Advair discuss 250/50 one bid  Thanks

## 2012-09-24 ENCOUNTER — Telehealth: Payer: Self-pay | Admitting: Pulmonary Disease

## 2012-09-24 MED ORDER — DOXYCYCLINE HYCLATE 50 MG PO CAPS: 100.0000 mg | ORAL_CAPSULE | Freq: Two times a day (BID) | ORAL | Status: AC

## 2012-09-24 NOTE — Telephone Encounter (Signed)
See phone note

## 2012-09-24 NOTE — Telephone Encounter (Signed)
Will close msg 

## 2012-09-24 NOTE — Telephone Encounter (Signed)
Called patient, mild congestion, no fever. Rec: start symbicort (has been off for one month) If no improvement then start doxycycline tomorrow (Rx sent) If no improvement or worse then go to MD

## 2012-09-24 NOTE — Telephone Encounter (Signed)
See phone note dated 09/20/12 BQ already spoke with this pt

## 2012-09-24 NOTE — Telephone Encounter (Signed)
LMOM TCB x2

## 2012-09-24 NOTE — Telephone Encounter (Signed)
Pt returned call.  She stated that she would like to know if BQ thinks she should take the Symbicort (it is the 160 strength); she has already picked this up.  Pt audbily wheezing on the phone and c/o increased SOB d/t the congestion.  Prod cough with yellow mucus, some chest tightness, some body aches yesterday xx3days.  Has been using children's mucinex syrup that has not provided much relief.  BQ with no openings this afternoon.  Ok to call pt back on her cell or LM on home number. [facility] [address] in [place] Allergies  Allergen Reactions  . Shellfish Allergy Anaphylaxis  . Asa (Aspirin)   . Eggs Or Egg-Derived Products     GI Upset  . Levaquin (Levofloxacin In D5w)    Dr Kendrick Fries please advise, thank you.

## 2012-10-14 ENCOUNTER — Telehealth: Payer: Self-pay | Admitting: Pulmonary Disease

## 2012-10-14 MED ORDER — SULFAMETHOXAZOLE-TRIMETHOPRIM 400-80 MG PO TABS: 1.0000 | ORAL_TABLET | Freq: Two times a day (BID) | ORAL | Status: DC

## 2012-10-14 NOTE — Telephone Encounter (Signed)
Rx was called to pharm in liquid form  Spoke with pt and notified her that this was done She verbalized understanding and nothing further needed

## 2012-10-14 NOTE — Telephone Encounter (Signed)
Called spoke with patient who c/o prod cough w/ white/yellow, raw airways, head congestion w/ occasional clear mucus, PND, hoarseness, some wheezing, chest tightness, increased SOB, body aches/chills onset 7.18.14.  [facility] [place] Allergies  Allergen Reactions  . Shellfish Allergy Anaphylaxis  . Asa (Aspirin)   . Eggs Or Egg-Derived Products     GI Upset  . Levaquin (Levofloxacin In D5w)    BQ is off today; will forward to doc of the day.  Dr Delford Field please advise, thank you. **pt requests "something in liquid form" as she has difficulty swallowing**

## 2012-10-14 NOTE — Telephone Encounter (Signed)
Call in bactrim DS one po bid:  Ask pharmacy for liquid equivilence  X 7days

## 2012-10-15 ENCOUNTER — Ambulatory Visit: Payer: Self-pay | Admitting: Pulmonary Disease

## 2012-10-22 ENCOUNTER — Encounter: Payer: Self-pay | Admitting: Pulmonary Disease

## 2012-10-22 ENCOUNTER — Ambulatory Visit (INDEPENDENT_AMBULATORY_CARE_PROVIDER_SITE_OTHER): Payer: Medicare Other | Admitting: Pulmonary Disease

## 2012-10-22 VITALS — BP 134/62 | HR 70 | Temp 97.8°F | Ht 63.5 in | Wt 103.8 lb

## 2012-10-22 DIAGNOSIS — J690 Pneumonitis due to inhalation of food and vomit: Secondary | ICD-10-CM

## 2012-10-22 DIAGNOSIS — D649 Anemia, unspecified: Secondary | ICD-10-CM

## 2012-10-22 DIAGNOSIS — Z01812 Encounter for preprocedural laboratory examination: Secondary | ICD-10-CM

## 2012-10-22 DIAGNOSIS — R0602 Shortness of breath: Secondary | ICD-10-CM

## 2012-10-22 DIAGNOSIS — R0609 Other forms of dyspnea: Secondary | ICD-10-CM

## 2012-10-22 DIAGNOSIS — R0989 Other specified symptoms and signs involving the circulatory and respiratory systems: Secondary | ICD-10-CM

## 2012-10-22 DIAGNOSIS — J8410 Pulmonary fibrosis, unspecified: Secondary | ICD-10-CM

## 2012-10-22 DIAGNOSIS — R0600 Dyspnea, unspecified: Secondary | ICD-10-CM

## 2012-10-22 DIAGNOSIS — R5381 Other malaise: Secondary | ICD-10-CM

## 2012-10-22 DIAGNOSIS — J849 Interstitial pulmonary disease, unspecified: Secondary | ICD-10-CM

## 2012-10-22 LAB — CBC WITH DIFFERENTIAL/PLATELET
Basophils Absolute: 0 10*3/uL (ref 0.0–0.1)
Basophils Relative: 0.5 % (ref 0.0–3.0)
Eosinophils Absolute: 0.2 10*3/uL (ref 0.0–0.7)
Eosinophils Relative: 3 % (ref 0.0–5.0)
HCT: 36.4 % (ref 36.0–46.0)
Hemoglobin: 12.1 g/dL (ref 12.0–15.0)
Lymphocytes Relative: 20.4 % (ref 12.0–46.0)
Lymphs Abs: 1.3 10*3/uL (ref 0.7–4.0)
MCHC: 33.2 g/dL (ref 30.0–36.0)
MCV: 97.1 fl (ref 78.0–100.0)
Monocytes Absolute: 0.5 10*3/uL (ref 0.1–1.0)
Monocytes Relative: 7.1 % (ref 3.0–12.0)
Neutro Abs: 4.4 10*3/uL (ref 1.4–7.7)
Neutrophils Relative %: 69 % (ref 43.0–77.0)
Platelets: 210 10*3/uL (ref 150.0–400.0)
RBC: 3.75 Mil/uL — ABNORMAL LOW (ref 3.87–5.11)
RDW: 13.3 % (ref 11.5–14.6)
WBC: 6.3 10*3/uL (ref 4.5–10.5)

## 2012-10-22 LAB — PROTIME-INR
INR: 1 ratio (ref 0.8–1.0)
Prothrombin Time: 10.9 s (ref 10.2–12.4)

## 2012-10-22 NOTE — Assessment & Plan Note (Signed)
Unfortunately, we may be reaching a point where Anne [name] needs to go back on tube feeds. Her recurrent pneumonia and bronchitis symptoms over the last several months her most likely due to recurrent aspiration given the fleeting infiltrates seen in the bases of her lungs bilaterally.  Plan: -We will send a BAL from the lower lobes for fungal and AFB organisms to see if there is another infectious etiology to her current symptoms -If no clear pathogen is identified, then we will likely have to proceed with tube feedings again and make her completely n.p.o. -We discussed this at length today that his nose is in the either of [place] would like to make but she understands that she may have to go this route. She has had 2 separate feeding tubes in the past.

## 2012-10-22 NOTE — Progress Notes (Signed)
Subjective:    Patient ID: Anne [name], female    DOB: 04-24-1950, 62 y.o.   MRN: [id number]  Synopsis: She enjoys establish care with the [facility] pulmonary clinic in may of 2014. 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 A CT scan in 2013 performed at [facility] showed some groundglass changes, interstitial thickening, and interlobular thickening in the upper lobes predominantly. Her last visit on 09/03/12, serologic workup for inflammtory disease was completely normal. She has a normal eosinophil count. She was scheduled to get a repeat CT chest in July 2014.    HPI  Anne [name] is a 62 yo F with history of COPD, asthma, postinflmmatory pulmonary fibrosis, multiple pulmonary nodules, aspiration pneumonia, anemia, and with tracheostomy.  ROV>>> 10/22/12 No chief complaints today. Here for F/U to view the CT taken at [facility] (10/15/12) for multiple pulmonary nodules. Her current symptoms are well controled, however she does have choking sensations, coughing and chest tightness, and hoarses from aspiration due to her vocal cord paresis. She has shortness of breath with extreme activity and notes it feels more work to breath in the humidity. She has occasional chills and fatigue, but today feels well. She has difficulty swallowing tablets and has requested only liquid form.  Cough, congestion, chills, some head congestion  Review of Systems  Constitutional: Negative for chills and fatigue.       OFF and on chills and fatigue in the last week, but not today  HENT: Positive for congestion, rhinorrhea, postnasal drip and  sinus pressure. Negative for sore throat.   Respiratory: Positive for cough, choking, chest tightness, shortness of breath and wheezing.   Cardiovascular: Negative for chest pain.  Gastrointestinal: Negative for abdominal pain.  Neurological: Negative for speech difficulty and weakness.       Objective:   Physical Exam  VITAL SIGNS:  Filed Vitals:   10/22/12 1124  BP: 134/62  Pulse: 70  Temp: 97.8 F (36.6 C)  TempSrc: Oral  Height: 5' 3.5" (1.613 m)  Weight: 103 lb 12.8 oz (47.083 kg)  SpO2: 100%     HEENT: Head is normocephalic and atraumatic. No inflammation of skin around tracheostomy. Extraocular muscles are intact. Pupils are equal, round, and reactive to light. Nares appeared normal. No inflamed turbinates or polyps. Ears are clear with cone of light and ossicles visualized. TM is non-buldging. Mouth is well hydrated and without lesions. Tonsils are removed. Mucous membranes are moist. Posterior pharynx clear of any exudate or lesions. NECK: Supple. No carotid bruits. No lymphadenopathy or thyromegaly. Tracheostomy. LUNGS: Wheezing heard throughout lung fields, especially anterior apical lung fields. No stridor, rhonchi, or rales. HEART: Regular rate and rhythm without murmur.  ABDOMEN: Soft, nontender, and nondistended. Positive bowel sounds. No hepatosplenomegaly was noted.  EXTREMITIES: Without any cyanosis, clubbing, rash, lesions or edema.  NEUROLOGIC: Cranial nerves II through XII are grossly intact.  SKIN: No ulceration or induration present.       Assessment & Plan:   Postinflammatory pulmonary fibrosis It is unclear to me if Anecia's fibrosis explains all of the cough, and shortness of breath which she has experienced intermittently over  the last several months. Honestly, she and I both feel that aspiration is a likely explanation.  The only way to know for certain what is going on in the upper part of the lungs with be to perform an open lung biopsy.  However, I do not know what her lung function is because of her tracheostomy so it's difficult to assess her risk for this procedure.  Plan:  -Bronchoscopy with BAL and possibly transbronchial biopsies planned for next week to assess interstitial lung disease -Hold Plavix starting now through next week to minimize bleeding risk -Check CBC and PT/INR today  Aspiration pneumonia Unfortunately, we may be reaching a point where [name] needs to go back on tube feeds. Her recurrent pneumonia and bronchitis symptoms over the last several months her most likely due to recurrent aspiration given the fleeting infiltrates seen in the bases of her lungs bilaterally.  Plan: -We will send a BAL from the lower lobes for fungal and AFB organisms to see if there is another infectious etiology to her current symptoms -If no clear pathogen is identified, then we will likely have to proceed with tube feedings again and make her completely n.p.o. -We discussed this at length today that his nose is in the either of [place] would like to make but she understands that she may have to go this route. She has had 2 separate feeding tubes in the past.   Attending: I have seen and examined the patient with nurse practitioner/resident and agree with the note above.  I have edited the note in full  Yolonda Kida PCCM Pager: [phone] Cell: [phone] If no response, call [phone]   Updated Medication List Outpatient Encounter Prescriptions as of 10/22/2012  Medication Sig Dispense Refill  . albuterol (PROVENTIL HFA;VENTOLIN HFA) 108 (90 BASE) MCG/ACT inhaler Inhale 2 puffs into the lungs every 6 (six) hours as needed for wheezing.      [name] albuterol (PROVENTIL) (2.5 MG/3ML) 0.083% nebulizer solution Take 2.5 mg by nebulization every 6 (six) hours as needed for wheezing.      [name] ALPRAZolam (XANAX) 0.25 MG tablet Take 0.25 mg by mouth 2 (two) times daily as needed for sleep.      [name] Alum & Mag Hydroxide-Simeth  (MAGIC MOUTHWASH) SOLN Take 5 mLs by mouth 3 (three) times daily as needed.      . Artificial Tear Ointment (ARTIFICIAL TEARS) ointment as needed.      [name] azelastine (OPTIVAR) 0.05 % ophthalmic solution Place 1 drop into both eyes 2 (two) times daily.      . budesonide-formoterol (SYMBICORT) 160-4.5 MCG/ACT inhaler Inhale 2 puffs into the lungs 2 (two) times daily.      . cetirizine (ZYRTEC) 10 MG tablet Take 10 mg by mouth daily.      . chlorpheniramine-HYDROcodone (TUSSIONEX) 10-8 MG/5ML LQCR Take 5 mLs by mouth every 12 (twelve) hours as needed.      . clopidogrel (PLAVIX) 75 MG tablet Take 75 mg by mouth daily.      . Cyanocobalamin (VITAMIN B-12 IJ) Inject as directed every 30 (thirty) days.      [name] estrogens, conjugated, (PREMARIN) 0.625 MG tablet Take 0.625 mg by mouth daily. Take daily for 21 days then do not take for 7 days.      . fluticasone (FLONASE) 50 MCG/ACT nasal spray Place 2 sprays into the nose as needed.       . furosemide (LASIX) 20 MG tablet Take 20 mg by mouth daily.      [name]  montelukast (SINGULAIR) 10 MG tablet Take 10 mg by mouth at bedtime.      . [DISCONTINUED] cefdinir (OMNICEF) 250 MG/5ML suspension Take 10 mLs by mouth daily.      . [DISCONTINUED] sulfamethoxazole-trimethoprim (BACTRIM) 400-80 MG per tablet Take 1 tablet by mouth 2 (two) times daily.  14 tablet  0   No facility-administered encounter medications on file as of 10/22/2012.

## 2012-10-22 NOTE — Assessment & Plan Note (Signed)
It is unclear to me if Anne [name]'s fibrosis explains all of the cough, and shortness of breath which she has experienced intermittently over the last several months. Honestly, she and I both feel that aspiration is a likely explanation.  The only way to know for certain what is going on in the upper part of the lungs with be to perform an open lung biopsy. However, I do not know what her lung function is because of her tracheostomy so it's difficult to assess her risk for this procedure.  Plan:  -Bronchoscopy with BAL and possibly transbronchial biopsies planned for next week to assess interstitial lung disease -Hold Plavix starting now through next week to minimize bleeding risk -Check CBC and PT/INR today

## 2012-10-22 NOTE — Addendum Note (Signed)
Addended by: Montine Circle D on: 10/22/2012 02:43 PM   Modules accepted: Orders

## 2012-10-22 NOTE — Assessment & Plan Note (Signed)
" >>  ASSESSMENT AND PLAN FOR PULMONARY FIBROSIS (HCC) WRITTEN ON 10/22/2012  1:05 PM BY Billiejean Schimek B, MD  It is unclear to me if Anne [name]'s fibrosis explains all of the cough, and shortness of breath which she has experienced intermittently over the last several months. Honestly, she and I both feel that aspiration is a likely explanation.  The only way to know for certain what is going on in the upper part of the lungs with be to perform an open lung biopsy. However, I do not know what her lung function is because of her tracheostomy so it's difficult to assess her risk for this procedure.  Plan:  -Bronchoscopy with BAL and possibly transbronchial biopsies planned for next week to assess interstitial lung disease -Hold Plavix  starting now through next week to minimize bleeding risk -Check CBC and PT/INR today "

## 2012-10-22 NOTE — Patient Instructions (Addendum)
Get a CBC and INR/PT today Bronchoscopy is scheduled for next Wednesday at [facility] to assess the scarring at top of lungs and will call with reminder

## 2012-10-23 NOTE — Progress Notes (Signed)
Quick Note:  LMOM letting the pt know labs were normal  ______

## 2012-10-24 ENCOUNTER — Telehealth: Payer: Self-pay | Admitting: Pulmonary Disease

## 2012-10-24 NOTE — Telephone Encounter (Signed)
Thanks

## 2012-10-24 NOTE — Telephone Encounter (Signed)
Message copied by Christen Butter on Thu Oct 24, 2012 11:28 AM ------      Message from: Max Fickle B      Created: Thu Oct 24, 2012  6:53 AM       L,            Can we have [facility] make a disk of her most recent CT Scan of her chest and forward it to my office?            Thanks,      Kipp Brood ------

## 2012-10-24 NOTE — Telephone Encounter (Signed)
I spoke with pt. She is scheduled for bronch 10/30/12. She is needing to r/s the following week d/t conflict at pt work. She stated if she must have it that day then just keep it but if possible she really needs to r/s Please advise Dr. Kendrick Fries

## 2012-10-24 NOTE — Telephone Encounter (Signed)
Spoke with [facility] radiology and I am going to fax a request for this to [phone] Will forward to Dr Kendrick Fries so he is aware

## 2012-10-25 ENCOUNTER — Ambulatory Visit: Payer: BC Managed Care – PPO | Admitting: Internal Medicine

## 2012-10-28 ENCOUNTER — Telehealth: Payer: Self-pay | Admitting: Pulmonary Disease

## 2012-10-28 NOTE — Telephone Encounter (Signed)
LMTCB x1 for pt.  

## 2012-10-28 NOTE — Telephone Encounter (Signed)
I spoke with BQ:  Pt needs to go to the ER for further evaluation.  She also needs to f/u with her PCP and call BQ back tomorrow with an update to regroup on bronch.    ----  Called, spoke with pt.  Informed her of above per BQ and explained the importance to her of ASAP f/u re: these symptoms.  Pt states she doesn't feel she needs to go to the ER.  She feels she "will be fine" and believes her BP may have dropped for a sec and this is what caused her symptoms.  Pt states she doesn't currently have a PCP; she will establish with PCP this Sept.  Dr Kendrick Fries, pls advise if you have any further recs.  Pt would also like to ensure she can still proceed with bronch on Wednesday.  Thank you.

## 2012-10-28 NOTE — Telephone Encounter (Signed)
Called patient to clarify history.  She states did not have numbness or word finding difficulty, just sinus symptoms were worse and during that she felt a little light headed.  She thinks her blood pressure may have dropped, but she never checked her blood pressure.  She says that she feels well now.  She will present for the bronchoscopy on Wednesday morning as scheduled.

## 2012-10-28 NOTE — Telephone Encounter (Signed)
Spoke with pt.   1.  Wanting to ensure bronch is still on for this Wednesday.  Per appt list, it is still scheduled for 8/6 at 7:30 am at [facility].  Advised pt of this, to arrive at 6:15 am per Resp instructions, and to be NPO after midnight.  She verbalized understanding. 2.  She has been holding plavix since Tuesday for bronch.  Reports this morning she drew a blank when having a conversation, has chills, and noticed some numbness on left side.  No slurred speech.  States these sort of mimic the symptoms she had previous with stroke but also questioning if they could be from being off plavix.  She is requesting BQ's further guidance on this.  BQ, pls advise on what you would like pt to do. Thank you.

## 2012-10-29 ENCOUNTER — Telehealth: Payer: Self-pay | Admitting: Pulmonary Disease

## 2012-10-29 NOTE — Telephone Encounter (Signed)
Pt has bronch scheduled tomorrow and feels ike she needs to cancel this. Pt has a lot of hoarseness, chest congestion, productive cough, sore throat, wheezing, SOB x 1 day. Pt states she thinks this is what was making her feel bad yesterday when she spoke with Dr. Kendrick Fries. Pt does not feel like she can go through with bronch tomorrow. I advised I will send a message to Dr. Kendrick Fries to see when bronch can be rescheduled to and if he wants to call anything for the pt. Please advise. Carron Curie, CMA Allergies  Allergen Reactions  . Shellfish Allergy Anaphylaxis  . Asa (Aspirin)   . Eggs Or Egg-Derived Products     GI Upset  . Levaquin (Levofloxacin In D5w)

## 2012-10-29 NOTE — Telephone Encounter (Signed)
Pt aware and appt set for 11-12-12. Carron Curie, CMA

## 2012-10-29 NOTE — Telephone Encounter (Signed)
Noted. Please schedule a follow up appointment with me for 2-3 weeks and instruct her to restart the Plavix

## 2012-10-30 ENCOUNTER — Encounter (HOSPITAL_COMMUNITY): Admission: RE | Payer: Self-pay | Source: Ambulatory Visit

## 2012-10-30 ENCOUNTER — Ambulatory Visit (HOSPITAL_COMMUNITY)
Admission: RE | Admit: 2012-10-30 | Payer: BC Managed Care – PPO | Source: Ambulatory Visit | Admitting: Pulmonary Disease

## 2012-10-30 ENCOUNTER — Encounter (HOSPITAL_COMMUNITY): Payer: BC Managed Care – PPO

## 2012-10-30 SURGERY — BRONCHOSCOPY, WITH FLUOROSCOPY
Anesthesia: Moderate Sedation | Laterality: Bilateral

## 2012-11-05 ENCOUNTER — Ambulatory Visit (INDEPENDENT_AMBULATORY_CARE_PROVIDER_SITE_OTHER): Payer: BC Managed Care – PPO | Admitting: Pulmonary Disease

## 2012-11-05 ENCOUNTER — Encounter: Payer: Self-pay | Admitting: Pulmonary Disease

## 2012-11-05 VITALS — BP 102/58 | HR 60 | Temp 98.0°F | Ht 63.5 in | Wt 105.0 lb

## 2012-11-05 DIAGNOSIS — J8410 Pulmonary fibrosis, unspecified: Secondary | ICD-10-CM

## 2012-11-05 DIAGNOSIS — J690 Pneumonitis due to inhalation of food and vomit: Secondary | ICD-10-CM

## 2012-11-05 DIAGNOSIS — R1319 Other dysphagia: Secondary | ICD-10-CM

## 2012-11-05 MED ORDER — AMOXICILLIN-POT CLAVULANATE 400-57 MG PO CHEW: 2.0000 | CHEWABLE_TABLET | Freq: Two times a day (BID) | ORAL | Status: DC

## 2012-11-05 NOTE — Patient Instructions (Signed)
We will set up a bronchoscopy for you on 11/20/2012 at [facility] Take the Augmentin twice a day for one week  We will make a referral for you to see Dr. Erick Blinks in [place] for your dysphagia.  We will schedule a follow up visit with [place] for 4-6 weeks for now

## 2012-11-05 NOTE — Assessment & Plan Note (Signed)
Anne [name] is having another episode of at least tracheobronchitis. I am encouraged by the fact that she does not appear to have full-blown pneumonia based on her physical exam and normal vital signs.  I do worry that she is having continued episodes of recurrent aspiration.  Plan: -Augmentin times one week -Bronchoscopy in 2 weeks (after completion of antibiotics in holding Plavix) to look for atypical infectious organisms versus other etiology of underlying lung disease -GI consult for esophageal dysphagia -Ultimately, she may need to go back to an n.p.o. status with a feeding tube.

## 2012-11-05 NOTE — Progress Notes (Signed)
Subjective:    Patient ID: Anne [name], female    DOB: 04/12/1950, 62 y.o.   MRN: [id number]  Synopsis: She enjoys establish care with the [facility] [place] pulmonary clinic in may of 2014. 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 A CT scan in 2013 performed at [facility] showed some groundglass changes, interstitial thickening, and interlobular thickening in the upper lobes predominantly.  HPI   08/13/2012 ROV >> Anne [name] has had chest congestion, cough, and increasing shortness of breath since Saturday. She has been taking Mucinex for this but it does not help. This is been associated with muscle aches, and fatigue. She has multiple joint aches, and states that her ring finger PIP has been swollen for the last several weeks. She also notes a new rash on her face which is red. She came to our clinic today as a sick visit but also would like to followup the results of the CT scan which was performed last week.  09/03/2012 ROV >> Anne [name] has been doing well since our last visit. After taking the antibiotic her cough went away. She still occasionally has a dry cough with singing and while eating meals. She has noted a significant amount of aspiration in the last several months. She has had chronic difficulty with swallowing. She often feels things like pills in certain foods getting stuck in her chest. She continues to get B12 shots and has done this for years. Her weight has been stable lately. She is continuing to use her medications as documented below. She has not been bleeding from anywhere as far she can tell.  11/05/2012  ROV >> Anne [name] has been having increasing cough, chest congestion and dyspnea for the last week. She's had chills but no fever. She states that some food still gets stuck when she swallows. She has had some sinus congestion but not much postnasal drip. She has not had a significant productive cough. She feels some soreness in the right upper chest/back from coughing.   Past Medical History  Diagnosis Date  . Asthma   . COPD (chronic obstructive pulmonary disease)   . Stroke   . Migraine   . Allergic rhinitis      Review of Systems  Constitutional: Positive for chills and fatigue. Negative for fever.  HENT: Negative for congestion, rhinorrhea and postnasal drip.   Respiratory: Positive for cough and shortness of breath. Negative for chest tightness.   Cardiovascular: Positive for chest pain. Negative for palpitations and leg swelling.       Objective:   Physical Exam  Filed Vitals:   11/05/12 1449  BP: 102/58  Pulse: 60  Temp: 98 F (36.7 C)  TempSrc: Oral  Height: 5' 3.5" (1.613 m)  Weight: 105 lb (47.628 kg)  SpO2: 97%   Gen:  no acute distress HEENT: NCAT, EOMi, OP clear,  PULM: poor air movement, some anterior upper wheezing  CV: RRR, no mgr, no JVD AB: BS+, soft, nontender, no hsm Ext: warm, no edema, no clubbing, no cyanosis   [facility]>> emphysema bilaterally; upper lobe groundglass opacification, interstitial thickening, interlobular thickening,   and apical cappingnoted bilaterally. This is significantly worsened since the 2007 comparison study. 07/2012 CT chest [facility] >> upper lobe interstitial thickening, consolidation, peripepheral and apical in distribution slightly increased from prior; also new clusters of ggo nodules in the RLL 07/2012 ANA neg, RF < 10, ESR 55 (high), CRP 10.6, IgE 3.1 (normal), SSB < 1, SSA 4 normal, SCL-70 neg, Crypto Ag neg, Anti-Jo-1 <0.2; CBC with diff > mild anemia, no eosinophilia 07/2012 HP panel  negative 07/2012 Aspergillus Ab panel/precipitins negative 08/2012 6MW 1300 feet rest HR 72, O2 97%, 6 min HR 119, O2 sat 88%     Assessment & Plan:   Aspiration pneumonia Anne [name] is having another episode of at least tracheobronchitis. I am encouraged by the fact that she does not appear to have full-blown pneumonia based on her physical exam and normal vital signs.  I do worry that she is having continued episodes of recurrent aspiration.  Plan: -Augmentin times one week -Bronchoscopy in 2 weeks (after completion of antibiotics in holding Plavix) to look for atypical infectious organisms versus other etiology of underlying lung disease -GI consult for esophageal dysphagia -Ultimately, she may need to go back to an n.p.o. status with a feeding tube.  Postinflammatory pulmonary fibrosis I stressed the importance of having the bronchoscopy. She understood and knows that we have it scheduled for 11/20/2012.    Updated Medication List Outpatient Encounter Prescriptions as of 11/05/2012  Medication Sig Dispense Refill  . albuterol (PROVENTIL HFA;VENTOLIN HFA) 108 (90 BASE) MCG/ACT inhaler Inhale 2 puffs into the lungs every 6 (six) hours as needed for wheezing.      . albuterol (PROVENTIL) (2.5 MG/3ML) 0.083% nebulizer solution Take 2.5 mg by nebulization every 6 (six) hours as needed for wheezing.      . ALPRAZolam (XANAX) 0.25 MG tablet Take 0.25 mg by mouth 2 (two) times daily as needed for sleep.      . Alum & Mag Hydroxide-Simeth (MAGIC MOUTHWASH) SOLN Take 5 mLs by mouth 3 (three) times daily as needed.      . Artificial Tear Ointment (ARTIFICIAL TEARS) ointment as needed.      . azelastine (OPTIVAR) 0.05 % ophthalmic solution Place 1 drop into both eyes 2 (two) times daily.      . budesonide-formoterol (SYMBICORT) 160-4.5 MCG/ACT inhaler Inhale 2 puffs into the lungs 2 (two) times daily.      . cetirizine (ZYRTEC) 10 MG tablet Take 10 mg by mouth daily.      .  chlorpheniramine-HYDROcodone (TUSSIONEX) 10-8 MG/5ML LQCR Take 5 mLs by mouth every 12 (twelve) hours as needed.      . clopidogrel (PLAVIX) 75 MG tablet Take 75 mg by mouth daily.      . Cyanocobalamin (VITAMIN B-12 IJ) Inject as directed every 30 (thirty) days.      . estrogens, conjugated, (PREMARIN) 0.625 MG tablet Take 0.625 mg by mouth daily. Take daily for 21 days then do not take for 7 days.      . fluticasone (FLONASE) 50 MCG/ACT nasal spray Place 2 sprays into the nose as needed.       . furosemide (LASIX) 20 MG tablet Take 20 mg by mouth daily.      . montelukast (SINGULAIR) 10 MG tablet Take 10 mg by mouth at bedtime.       No facility-administered encounter medications on file as of 11/05/2012.      

## 2012-11-05 NOTE — Assessment & Plan Note (Signed)
" >>  ASSESSMENT AND PLAN FOR PULMONARY FIBROSIS (HCC) WRITTEN ON 11/05/2012  3:50 PM BY Obdulio Mash B, MD  I stressed the importance of having the bronchoscopy. She understood and knows that we have it scheduled for 11/20/2012. "

## 2012-11-05 NOTE — Assessment & Plan Note (Addendum)
I stressed the importance of having the bronchoscopy. She understood and knows that we have it scheduled for 11/20/2012.

## 2012-11-08 ENCOUNTER — Encounter: Payer: Self-pay | Admitting: Internal Medicine

## 2012-11-11 ENCOUNTER — Telehealth: Payer: Self-pay | Admitting: Pulmonary Disease

## 2012-11-11 NOTE — Telephone Encounter (Signed)
Ok to stop augmentin at this point and regroup with Dr Kendrick Fries in a week or so if not back to baseline in all regards

## 2012-11-11 NOTE — Telephone Encounter (Signed)
Patient returning call.

## 2012-11-11 NOTE — Telephone Encounter (Signed)
LMTCBx1 on cell #, home number [phone] and no voicemail. Carron Curie, CMA

## 2012-11-11 NOTE — Telephone Encounter (Signed)
Returning call can be reached at [phone].Anne [name] ° °

## 2012-11-11 NOTE — Telephone Encounter (Signed)
lmtcb

## 2012-11-11 NOTE — Telephone Encounter (Addendum)
Called spoke with patient.  Pt stated that she was able to take the Augmentin given at 8.12.14 "really well until yesterday".  She reports that she was nauseated will taking this and did not have an appetite and her face was very flushed -- these symptoms have resolved since stopping the augmentin.  She tried taking the abx with- and without food with no improvement.  Patient stated that her last dose was yesterday morning.  Pt's respiratory symptoms have improved slightly since beginning the augmentin but still persist.  Patient would like to know if BQ would like to change antibiotics or hold off for now.  Dr Kendrick Fries is off this PM so I will send to doc-of-the-day.

## 2012-11-11 NOTE — Telephone Encounter (Signed)
Pt advised. Khani Paino, CMA  

## 2012-11-12 ENCOUNTER — Ambulatory Visit: Payer: BC Managed Care – PPO | Admitting: Pulmonary Disease

## 2012-11-13 ENCOUNTER — Telehealth: Payer: Self-pay | Admitting: Pulmonary Disease

## 2012-11-13 MED ORDER — MOXIFLOXACIN HCL 400 MG PO TABS: 400.0000 mg | ORAL_TABLET | Freq: Every day | ORAL | Status: DC

## 2012-11-13 NOTE — Telephone Encounter (Signed)
LMOMTCB x1 for pt 

## 2012-11-13 NOTE — Telephone Encounter (Signed)
I spoke with Anne [name] and she stated the Levaquin caused severe swelling in her body/legs. She stated she think she has taken avelox in the past so not aware of any reaction. Please advise KC thanks  --please call cell when calling back

## 2012-11-13 NOTE — Telephone Encounter (Signed)
I spoke with pt and is aware of recs. RX has been sent and nothing further needed

## 2012-11-13 NOTE — Telephone Encounter (Signed)
Ok to call in avelox 400mg  one each day for 7 days.

## 2012-11-13 NOTE — Telephone Encounter (Signed)
I spoke with pt. She stated she was giving augmentin on 11/05/12 (last OV). She took for 4 days and had to stop d/t it messed up her stomach. She is scheduled for bronch 11/20/12. She c/o wheezing, congestion, slight cough, neck sore, PND. When she was cleaning out trach last night it was yellow phlem (no odor to it). She would like something called in since the weekend is coming up. Please advise KC as BQ is night float thanks  Allergies  Allergen Reactions  . Shellfish Allergy Anaphylaxis  . Asa [Aspirin]   . Eggs Or Egg-Derived Products     GI Upset  . Levaquin [Levofloxacin In D5w]

## 2012-11-13 NOTE — Telephone Encounter (Signed)
I see that she has an allergy to levaquin, but not documented as to what happens. See if she can take avelox and what her reaction to levaquin was.

## 2012-11-18 ENCOUNTER — Telehealth: Payer: Self-pay | Admitting: Pulmonary Disease

## 2012-11-18 NOTE — Telephone Encounter (Signed)
I spoke with the pt and advised I added avelox to allergy list. I also advised that bronch is set for 11-20-12 and that respiratory will call her tomorrow with instructions. Carron Curie, CMA

## 2012-11-19 NOTE — Progress Notes (Signed)
Pre op phone call done. Pt aware to to come to [facility] at 0615, NPO after midnight, no meds to be taken morning of procedure. Pt has a trach, had for 18 years. Husband will be bringing her in for procedure, cell number is [phone]. Any other questions to patient, her cell number is [phone].

## 2012-11-20 ENCOUNTER — Encounter (HOSPITAL_COMMUNITY)
Admission: RE | Disposition: A | Discharge status: Home / Self Care | Payer: Self-pay | Source: Ambulatory Visit | Attending: Pulmonary Disease

## 2012-11-20 ENCOUNTER — Ambulatory Visit (HOSPITAL_COMMUNITY)
Admission: RE | Admit: 2012-11-20 | Discharge: 2012-11-20 | Disposition: A | Discharge status: Home / Self Care | Payer: BC Managed Care – PPO | Source: Ambulatory Visit | Attending: Pulmonary Disease | Admitting: Pulmonary Disease

## 2012-11-20 ENCOUNTER — Ambulatory Visit (HOSPITAL_COMMUNITY): Payer: BC Managed Care – PPO

## 2012-11-20 ENCOUNTER — Encounter (HOSPITAL_COMMUNITY): Payer: Self-pay | Admitting: Radiology

## 2012-11-20 ENCOUNTER — Telehealth: Payer: Self-pay | Admitting: *Deleted

## 2012-11-20 DIAGNOSIS — J690 Pneumonitis due to inhalation of food and vomit: Secondary | ICD-10-CM

## 2012-11-20 DIAGNOSIS — J4489 Other specified chronic obstructive pulmonary disease: Secondary | ICD-10-CM | POA: Insufficient documentation

## 2012-11-20 DIAGNOSIS — J8410 Pulmonary fibrosis, unspecified: Secondary | ICD-10-CM | POA: Insufficient documentation

## 2012-11-20 DIAGNOSIS — J984 Other disorders of lung: Secondary | ICD-10-CM | POA: Insufficient documentation

## 2012-11-20 DIAGNOSIS — Z8673 Personal history of transient ischemic attack (TIA), and cerebral infarction without residual deficits: Secondary | ICD-10-CM | POA: Insufficient documentation

## 2012-11-20 DIAGNOSIS — E8801 Alpha-1-antitrypsin deficiency: Secondary | ICD-10-CM | POA: Insufficient documentation

## 2012-11-20 DIAGNOSIS — Z79899 Other long term (current) drug therapy: Secondary | ICD-10-CM | POA: Insufficient documentation

## 2012-11-20 DIAGNOSIS — J449 Chronic obstructive pulmonary disease, unspecified: Secondary | ICD-10-CM | POA: Insufficient documentation

## 2012-11-20 DIAGNOSIS — Z930 Tracheostomy status: Secondary | ICD-10-CM | POA: Insufficient documentation

## 2012-11-20 HISTORY — PX: VIDEO BRONCHOSCOPY: SHX5072

## 2012-11-20 SURGERY — BRONCHOSCOPY, WITH FLUOROSCOPY
Anesthesia: Moderate Sedation | Laterality: Bilateral

## 2012-11-20 MED ORDER — MIDAZOLAM HCL 10 MG/2ML IJ SOLN
INTRAMUSCULAR | Status: AC
  Filled 2012-11-20: qty 4

## 2012-11-20 MED ORDER — FENTANYL CITRATE 0.05 MG/ML IJ SOLN
INTRAMUSCULAR | Status: DC | PRN
  Administered 2012-11-20: 25 ug via INTRAVENOUS

## 2012-11-20 MED ORDER — FENTANYL CITRATE 0.05 MG/ML IJ SOLN
INTRAMUSCULAR | Status: AC
  Filled 2012-11-20: qty 4

## 2012-11-20 MED ORDER — MIDAZOLAM HCL 10 MG/2ML IJ SOLN
INTRAMUSCULAR | Status: DC | PRN
  Administered 2012-11-20: 1 mg via INTRAVENOUS

## 2012-11-20 NOTE — CV Procedure (Signed)
Updated H&P  I have seen and examined Anne [name] this morning before the procedure.  Her cough has improved somewhat and she is not producing much mucus.  Her breathing is at baseline.  Her exam is unchanged from prior.  No changes from her prior clinic notes.  Ercole Georg [place] PCCM Pager: [phone] Cell: [phone] If no response, call [phone]    

## 2012-11-20 NOTE — Interval H&P Note (Signed)
Updated H&P  I have seen and examined Ms. Anne [name] this morning before the procedure.  Her cough has improved somewhat and she is not producing much mucus.  Her breathing is at baseline.  Her exam is unchanged from prior.  No changes from her prior clinic notes.  Anne [name] PCCM Pager: [phone] Cell: [phone] If no response, call [phone]

## 2012-11-20 NOTE — H&P (View-Only) (Signed)
Subjective:    Patient ID: Anne [name], female    DOB: Aug 04, 1950, 62 y.o.   MRN: [id number]  Synopsis: She enjoys establish care with the [facility] pulmonary clinic in may of 2014. 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 A CT scan in 2013 performed at [facility] showed some groundglass changes, interstitial thickening, and interlobular thickening in the upper lobes predominantly.  HPI   08/13/2012 ROV >> Anne [name] has had chest congestion, cough, and increasing shortness of breath since Saturday. She has been taking Mucinex for this but it does not help. This is been associated with muscle aches, and fatigue. She has multiple joint aches, and states that her ring finger PIP has been swollen for the last several weeks. She also notes a new rash on her face which is red. She came to our clinic today as a sick visit but also would like to followup the results of the CT scan which was performed last week.  09/03/2012 ROV >> Anne [name] has been doing well since our last visit. After taking the antibiotic her cough went away. She still occasionally has a dry cough with singing and while eating meals. She has noted a significant amount of aspiration in the last several months. She has had chronic difficulty with swallowing. She often feels things like pills in certain foods getting stuck in her chest. She continues to get B12 shots and has done this for years. Her weight has been stable lately. She is continuing to use her medications as documented below. She has not been bleeding from anywhere as far she can tell.  11/05/2012  ROV >> Anne [name] has been having increasing cough, chest congestion and dyspnea for the last week. She's had chills but no fever. She states that some food still gets stuck when she swallows. She has had some sinus congestion but not much postnasal drip. She has not had a significant productive cough. She feels some soreness in the right upper chest/back from coughing.   Past Medical History  Diagnosis Date  . Asthma   . COPD (chronic obstructive pulmonary disease)   . Stroke   . Migraine   . Allergic rhinitis      Review of Systems  Constitutional: Positive for chills and fatigue. Negative for fever.  HENT: Negative for congestion, rhinorrhea and postnasal drip.   Respiratory: Positive for cough and shortness of breath. Negative for chest tightness.   Cardiovascular: Positive for chest pain. Negative for palpitations and leg swelling.       Objective:   Physical Exam  Filed Vitals:   11/05/12 1449  BP: 102/58  Pulse: 60  Temp: 98 F (36.7 C)  TempSrc: Oral  Height: 5' 3.5" (1.613 m)  Weight: 105 lb (47.628 kg)  SpO2: 97%   Gen:  no acute distress HEENT: NCAT, EOMi, OP clear,  PULM: poor air movement, some anterior upper wheezing  CV: RRR, no mgr, no JVD AB: BS+, soft, nontender, no hsm Ext: warm, no edema, no clubbing, no cyanosis   04/2011 [facility] at [facility]>> emphysema bilaterally; upper lobe groundglass opacification, interstitial thickening, interlobular thickening,  and apical cappingnoted bilaterally. This is significantly worsened since the 2007 comparison study. 07/2012 CT chest [facility] >> upper lobe interstitial thickening, consolidation, peripepheral and apical in distribution slightly increased from prior; also new clusters of ggo nodules in the RLL 07/2012 ANA neg, RF < 10, ESR 55 (high), CRP 10.6, IgE 3.1 (normal), SSB < 1, SSA 4 normal, SCL-70 neg, Crypto Ag neg, Anti-Jo-1 <0.2; CBC with diff > mild anemia, no eosinophilia 07/2012 HP panel  negative 07/2012 Aspergillus Ab panel/precipitins negative 08/2012 1300 feet rest HR 72, O2 97%, 6 min HR 119, O2 sat 88%     Assessment & Plan:   Aspiration pneumonia Anne [name] is having another episode of at least tracheobronchitis. I am encouraged by the fact that she does not appear to have full-blown pneumonia based on her physical exam and normal vital signs.  I do worry that she is having continued episodes of recurrent aspiration.  Plan: -Augmentin times one week -Bronchoscopy in 2 weeks (after completion of antibiotics in holding Plavix) to look for atypical infectious organisms versus other etiology of underlying lung disease -GI consult for esophageal dysphagia -Ultimately, she may need to go back to an n.p.o. status with a feeding tube.  Postinflammatory pulmonary fibrosis I stressed the importance of having the bronchoscopy. She understood and knows that we have it scheduled for 11/20/2012.    Updated Medication List Outpatient Encounter Prescriptions as of 11/05/2012  Medication Sig Dispense Refill  . albuterol (PROVENTIL HFA;VENTOLIN HFA) 108 (90 BASE) MCG/ACT inhaler Inhale 2 puffs into the lungs every 6 (six) hours as needed for wheezing.      [name] albuterol (PROVENTIL) (2.5 MG/3ML) 0.083% nebulizer solution Take 2.5 mg by nebulization every 6 (six) hours as needed for wheezing.      [name] ALPRAZolam (XANAX) 0.25 MG tablet Take 0.25 mg by mouth 2 (two) times daily as needed for sleep.      [name] Alum & Mag Hydroxide-Simeth (MAGIC MOUTHWASH) SOLN Take 5 mLs by mouth 3 (three) times daily as needed.      . Artificial Tear Ointment (ARTIFICIAL TEARS) ointment as needed.      [name] azelastine (OPTIVAR) 0.05 % ophthalmic solution Place 1 drop into both eyes 2 (two) times daily.      . budesonide-formoterol (SYMBICORT) 160-4.5 MCG/ACT inhaler Inhale 2 puffs into the lungs 2 (two) times daily.      . cetirizine (ZYRTEC) 10 MG tablet Take 10 mg by mouth daily.      .  chlorpheniramine-HYDROcodone (TUSSIONEX) 10-8 MG/5ML LQCR Take 5 mLs by mouth every 12 (twelve) hours as needed.      . clopidogrel (PLAVIX) 75 MG tablet Take 75 mg by mouth daily.      . Cyanocobalamin (VITAMIN B-12 IJ) Inject as directed every 30 (thirty) days.      [name] estrogens, conjugated, (PREMARIN) 0.625 MG tablet Take 0.625 mg by mouth daily. Take daily for 21 days then do not take for 7 days.      . fluticasone (FLONASE) 50 MCG/ACT nasal spray Place 2 sprays into the nose as needed.       . furosemide (LASIX) 20 MG tablet Take 20 mg by mouth daily.      . montelukast (SINGULAIR) 10 MG tablet Take 10 mg by mouth at bedtime.       No facility-administered encounter medications on file as of 11/05/2012.

## 2012-11-20 NOTE — Telephone Encounter (Signed)
Message copied by Caryl Ada on Wed Nov 20, 2012  1:10 PM ------      Message from: Max Fickle B      Created: Wed Nov 20, 2012  8:29 AM       Hello,            Can we make sure she has a follow up appointment with me in 2-3 weeks?            Thanks,      Heber Wallace ------

## 2012-11-20 NOTE — Progress Notes (Signed)
Bronch w/ video intervention performed, bronchial biopsy intervention performed, bronchial washing x2 intervention performed.

## 2012-11-20 NOTE — Op Note (Addendum)
PCCM Bronchoscopy Procedure Note  The patient was informed of the risks (including but not limited to bleeding, infection, respiratory failure, lung injury, tooth/oral injury) and benefits of the procedure and gave consent, see chart.  Indication: Recurrent pulmonary infection and pulmonary fibrosis  Location: [facility]  Condition pre procedure: Stable  Medications for procedure: Versed 1mg , Fentanyl  Procedure description: The bronchoscope was introduced through the tracheal stoma and passed to the bilateral lungs to the level of the subsegmental bronchi throughout the tracheobronchial tree.  Airway exam revealed normal anatomy and tracheobronchial tree bilaterally; there were scant, then white secretions in the bronchus intermedius and right lower lobe which were suctioned.  No endobronchial lesions were identified.  Procedures performed:  1) BAL in the anterior segment of the RUL 40cc sterile saline injected; 15 cc cloudy fluid returned; this was sent for culture 2) BAL in the anterior segment of the RUL 70cc sterile saline injected; 30cc of cloudy fluid returned; this was sent for cytology and flow cytometry 3) Transbronchial biopsies in the Anterior segment of the right upper lobe (2 passes, 2 specimens obtained) and the Posterior segment of the right upper lobe (3 passes, 3 specimens obtained)  Specimens sent:  1) Bacterial, Fungal, AFB culture (BAL) Anterior segment RLL 2) Cytology (BAL) Anterior segment RUL anterior segment 3) Flow cytometry (CD4:CD8) ratio Anterior segment RUL 4) Transbronchial biopsies anterior segment and posterior segment of RUL    < 15cc; bleeding from the posterior segment of the RUL was treated with 10cc of iced sterile saline with good result  Condition post procedure: stable  Patient instructions: we will call you with the resuls of the procedure.

## 2012-11-20 NOTE — Telephone Encounter (Signed)
Appointment has been made for 12/10/2012 at 10:45a.

## 2012-11-21 ENCOUNTER — Encounter (HOSPITAL_COMMUNITY): Payer: Self-pay | Admitting: Pulmonary Disease

## 2012-11-22 ENCOUNTER — Telehealth: Payer: Self-pay | Admitting: Pulmonary Disease

## 2012-11-22 NOTE — Telephone Encounter (Signed)
BQ---pt is calling and wanting to get the results of her bronch.  She stated that the soreness she feels is coming from all the coughing that she has been doing.  She stated that she does have a stuffy nose----she feels this is from the weather. She stated that she will try to take some benadryl to help with this.  Pt is wanting to get the results---she did not want to wait all weekend for these results.  BQ please advise. Thanks   Allergies  Allergen Reactions  . Shellfish Allergy Anaphylaxis  . Asa [Aspirin]   . Avelox [Moxifloxacin]     Increased heart rate  . Eggs Or Egg-Derived Products     GI Upset  . Levaquin [Levofloxacin In D5w]

## 2012-11-23 LAB — CULTURE, RESPIRATORY W GRAM STAIN: Special Requests: NORMAL

## 2012-11-24 ENCOUNTER — Other Ambulatory Visit: Payer: Self-pay | Admitting: Pulmonary Disease

## 2012-11-24 ENCOUNTER — Telehealth: Payer: Self-pay | Admitting: Pulmonary Disease

## 2012-11-24 DIAGNOSIS — J8410 Pulmonary fibrosis, unspecified: Secondary | ICD-10-CM

## 2012-11-24 DIAGNOSIS — J690 Pneumonitis due to inhalation of food and vomit: Secondary | ICD-10-CM

## 2012-11-24 MED ORDER — CIPROFLOXACIN HCL 750 MG PO TABS: 750.0000 mg | ORAL_TABLET | Freq: Two times a day (BID) | ORAL | Status: AC

## 2012-11-24 NOTE — Telephone Encounter (Signed)
Late entry. Called patient on Friday 8/29 at 1500 to discuss results of bronch.  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  Will watch for culture results, then if all negative will treat empirically with prednisone for 2-3 months.  Yolonda Kida PCCM Pager: [phone] Cell: [phone] If no response, call [phone]

## 2012-11-26 ENCOUNTER — Telehealth: Payer: Self-pay | Admitting: Pulmonary Disease

## 2012-11-26 NOTE — Telephone Encounter (Signed)
I spoke with the pt and she states the abx she is taking, cipro, is causing her to have diarrhea. She doe snot want to change because she knows this is the only thing that will treat her but she wanted to know could she try immodium. I advised this would be fine and to let us know if no improvement. Carron Curie, CMA

## 2012-11-26 NOTE — Telephone Encounter (Signed)
LMOMTCB x1 FOR PT

## 2012-11-26 NOTE — Telephone Encounter (Signed)
Pt returned call. Anne [name] °

## 2012-12-02 ENCOUNTER — Encounter: Payer: Self-pay | Admitting: Internal Medicine

## 2012-12-02 NOTE — Telephone Encounter (Signed)
Per phone msg from 11/24/12, BQ spoke with pt about bronch results.  Will sign off.

## 2012-12-04 ENCOUNTER — Encounter: Payer: Self-pay | Admitting: Internal Medicine

## 2012-12-04 ENCOUNTER — Ambulatory Visit (INDEPENDENT_AMBULATORY_CARE_PROVIDER_SITE_OTHER): Payer: BC Managed Care – PPO | Admitting: Internal Medicine

## 2012-12-04 VITALS — BP 100/60 | HR 67 | Ht 63.0 in | Wt 103.0 lb

## 2012-12-04 DIAGNOSIS — Z8709 Personal history of other diseases of the respiratory system: Secondary | ICD-10-CM

## 2012-12-04 DIAGNOSIS — R1310 Dysphagia, unspecified: Secondary | ICD-10-CM

## 2012-12-04 DIAGNOSIS — J8410 Pulmonary fibrosis, unspecified: Secondary | ICD-10-CM

## 2012-12-04 NOTE — Progress Notes (Signed)
Patient ID: Anne [name], female   DOB: [birth year], 62 y.o.   MRN: [id number] HPI: Anne [name] is a 62 yo female with PMH of asthma with pulmonary fibrosis status post permanent tracheostomy, history of CVA, migraines, history of aspiration, history of difficulty swallowing, and migraines who is seen in consultation at the request of Dr. Kendrick Fries for evaluation of esophageal dysphagia. The patient is here alone today. She has a complex GI history and long-standing trouble swallowing. She also has a long-standing history of aspiration events, and carries diagnosis of pulmonary fibrosis. She has had a tracheostomy in place for 17 years, but for the most part is not in use. She reports she has to open her tracheostomy site 1-2 times per year when she has a "asthma attack". She reports ongoing issues with "very frequent antibiotics" for pulmonary infection, and part thought secondary to possible aspiration. She has a history of 2 prior feeding tubes, felt to be PEG tubes, which were surgically placed in [place]. Her second feeding tube has been out for approximately 2 years. She reports using it only for supplemental feeding. She is still eating by mouth but has a very long history of trouble swallowing. This is worse for solid foods versus liquids. She reports she cannot eat meat at all. She eats very very small meals frequently. She reports often feeling as if food sits in her esophagus until it clears by itself. She denies heartburn. She does report coughing with eating. She is able to swallow small pills but not large pills. She denies abdominal pain today. Bowel movements have been regular for her without blood or melena. She does take Plavix for history of TIA versus CVA.  She recalls multiple GI evaluations in the past performed by Dr. Mechele Collin in [place], Dr. Lemar Livings in [place], Dr. Kinnie Scales and [place], and a GI evaluation at [facility].  None of these have been recent.  She  recalls history of previous upper endoscopy. She also recalls bougie dilation by her ENT MD in [place] (Dr. Elenore Rota).  She does not recall this dilation to have been helpful.  She also reports that Dr. Kendrick Fries mentioned that she may need a feeding tube again placed, but this time possibly to remain n.p.o. to avoid aspiration.   The thought of this makes her quite upset as she enjoys eating very much and wonders how she would ever be social again.  Past Medical History  Diagnosis Date  . Asthma   . COPD (chronic obstructive pulmonary disease)   . Stroke   . Migraine   . Allergic rhinitis   . Anemia   . IBS (irritable bowel syndrome)     Past Surgical History  Procedure Laterality Date  . Abdominal hysterectomy    . Tubal ligation    . Tracheostomy  1996    done at [facility]  . Video bronchoscopy Bilateral 11/20/2012    Procedure: VIDEO BRONCHOSCOPY WITH FLUORO;  Surgeon: Lupita Leash, MD;  Location: [facility] ENDOSCOPY;  Service: Cardiopulmonary;  Laterality: Bilateral;    Current Outpatient Prescriptions  Medication Sig Dispense Refill  . albuterol (PROVENTIL HFA;VENTOLIN HFA) 108 (90 BASE) MCG/ACT inhaler Inhale 2 puffs into the lungs every 6 (six) hours as needed for wheezing.      [name] albuterol (PROVENTIL) (2.5 MG/3ML) 0.083% nebulizer solution Take 2.5 mg by nebulization every 6 (six) hours as needed for wheezing.      [name] ALPRAZolam (XANAX) 0.25 MG tablet Take 0.25 mg by mouth 2 (  two) times daily as needed for sleep.      [name] Alum & Mag Hydroxide-Simeth (MAGIC MOUTHWASH) SOLN Take 5 mLs by mouth 3 (three) times daily as needed.      . Artificial Tear Ointment (ARTIFICIAL TEARS) ointment as needed.      [name] azelastine (OPTIVAR) 0.05 % ophthalmic solution Place 1 drop into both eyes 2 (two) times daily.      . budesonide-formoterol (SYMBICORT) 160-4.5 MCG/ACT inhaler Inhale 2 puffs into the lungs 2 (two) times daily.      . cetirizine (ZYRTEC) 10 MG tablet Take 10 mg by mouth daily.      .  chlorpheniramine-HYDROcodone (TUSSIONEX) 10-8 MG/5ML LQCR Take 5 mLs by mouth every 12 (twelve) hours as needed.      . ciprofloxacin (CIPRO) 750 MG tablet Take 1 tablet (750 mg total) by mouth 2 (two) times daily.  20 tablet  0  . clopidogrel (PLAVIX) 75 MG tablet Take 75 mg by mouth daily.      . Cyanocobalamin (VITAMIN B-12 IJ) Inject as directed every 30 (thirty) days.      [name] estrogens, conjugated, (PREMARIN) 0.625 MG tablet Take 0.625 mg by mouth daily. Take daily for 21 days then do not take for 7 days.      . fluticasone (FLONASE) 50 MCG/ACT nasal spray Place 2 sprays into the nose as needed.       . furosemide (LASIX) 20 MG tablet Take 20 mg by mouth daily.      . montelukast (SINGULAIR) 10 MG tablet Take 10 mg by mouth at bedtime.       No current facility-administered medications for this visit.    Allergies  Allergen Reactions  . Shellfish Allergy Anaphylaxis  . Asa [Aspirin]   . Avelox [Moxifloxacin]     Increased heart rate  . Eggs Or Egg-Derived Products     GI Upset  . Levaquin [Levofloxacin In D5w]     Family History  Problem Relation Age of Onset  . Asthma Cousin   . COPD Cousin   . Breast cancer Maternal Grandmother   . Breast cancer Maternal Aunt     History  Substance Use Topics  . Smoking status: Never Smoker   . Smokeless tobacco: Never Used  . Alcohol Use: No    ROS: As per history of present illness, otherwise negative  BP 100/60  Pulse 67  Ht 5\' 3"  (1.6 m)  Wt 103 lb (46.72 kg)  BMI 18.25 kg/m2 Constitutional: Well-developed and well-nourished. No distress. HEENT: Normocephalic and atraumatic. Oropharynx is clear and moist. No oropharyngeal exudate. Conjunctivae are normal.  No scleral icterus. Neck: Neck supple. Tracheostomy in place Cardiovascular: Normal rate, regular rhythm and intact distal pulses. Pulmonary/chest: Distant breath sounds Abdominal: Soft, thin, 2 prior enteral feeding tube scars which are well-healed in the left abdomen,  nontender, nondistended. Bowel sounds active throughout.  Extremities: no clubbing, cyanosis, or edema Neurological: Alert and oriented to person place and time. Skin: Skin is warm and dry. No rashes noted. Psychiatric: Normal mood and affect. Behavior is normal.  RELEVANT LABS AND IMAGING: CBC    Component Value Date/Time   WBC 6.3 10/22/2012 1443   RBC 3.75* 10/22/2012 1443   HGB 12.1 10/22/2012 1443   HCT 36.4 10/22/2012 1443   PLT 210.0 10/22/2012 1443   MCV 97.1 10/22/2012 1443   MCHC 33.2 10/22/2012 1443   RDW 13.3 10/22/2012 1443   LYMPHSABS 1.3 10/22/2012 1443   MONOABS 0.5 10/22/2012 1443  EOSABS 0.2 10/22/2012 1443   BASOSABS 0.0 10/22/2012 1443   CT chest July 2014 -- changes of bronchiectasis and interstitial fibrosis  Remote barium swallow performed 04/18/2002 -- no esophageal obstruction, barium tablet hangs in the distal esophagus and does not pass into the stomach until the tablet dissolved. No esophageal ulcers seen. "There is noted intermittent reverse peristalsis in the upper esophagus even with small amounts of barium are present"  ASSESSMENT/PLAN: 62 yo female with PMH of asthma with pulmonary fibrosis status post permanent tracheostomy, history of CVA, migraines, history of aspiration, history of difficulty swallowing, and migraines who is seen in consultation at the request of Dr. Kendrick Fries for evaluation of esophageal dysphagia.  1.  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 I would like to formally evaluate and exclude achalasia. I recommended a high-resolution manometry in this regard. I also will attempt to gather her previous GI and endoscopy records from her doctors in [place], [place] and [place].  She may in fact need another upper endoscopy, and for which Plavix would need to be held, but we will start with manometry. If aspiration continues, I agree that gastrostomy versus jejunostomy tube  may be very appropriate. However, if she were found to have achalasia, she could benefit from Botox injections to the LES or even myotomy.  Further recommendations after manometry. She will continue with dietary modifications as she has been doing for so long including smaller more frequent meals, remaining upright for at least 60 minutes after eating, etc.

## 2012-12-04 NOTE — Patient Instructions (Addendum)
You have been scheduled for an esophageal manometry at [facility] on 12/16/2012 at 11:00. Please arrive 30 minutes prior to your procedure for registration. You will need to go to outpatient registration (1st floor of the hospital) first. Make certain to bring your insurance cards as well as a complete list of medications.  Please remember the following:  1) Nothing to eat or drink after 12:00 midnight on the night before your test.  2) Hold all diabetic medications/insulin the morning of the test. You may eat and take   your medications after the test.  3) For 3 days prior to your test do not take: Dexilant, Prevacid, Nexium, Protonix,  Aciphex, Zegerid, Pantoprazole, Prilosec or omeprazole.  4) For 2 days prior to your test, do not take: Reglan, Tagamet, Zantac, Axid or Pepcid.  5) You MAY use an antacid such as Rolaids or Tums up to 12 hours prior to your test.  It will take at least 2 weeks to receive the results of this test from your physician. ------------------------------------------ ABOUT ESOPHAGEAL MANOMETRY Esophageal manometry (muh-NOM-uh-tree) is a test that gauges how well your esophagus works. Your esophagus is the long, muscular tube that connects your throat to your stomach. Esophageal manometry measures the rhythmic muscle contractions (peristalsis) that occur in your esophagus when you swallow. Esophageal manometry also measures the coordination and force exerted by the muscles of your esophagus.  During esophageal manometry, a thin, flexible tube (catheter) that contains sensors is passed through your nose, down your esophagus and into your stomach. Esophageal manometry can be helpful in diagnosing some mostly uncommon disorders that affect your esophagus.  Why it's done Esophageal manometry is used to evaluate the movement (motility) of food through the esophagus and into the stomach. The test measures how well the circular bands of muscle (sphincters) at the top  and bottom of your esophagus open and close, as well as the pressure, strength and pattern of the wave of esophageal muscle contractions that moves food along.  What you can expect Esophageal manometry is an outpatient procedure done without sedation. Most people tolerate it well. You may be asked to change into a hospital gown before the test starts.  During esophageal manometry  While you are sitting up, a member of your health care team sprays your throat with a numbing medication or puts numbing gel in your nose or both.  A catheter is guided through your nose into your esophagus. The catheter may be sheathed in a water-filled sleeve. It doesn't interfere with your breathing. However, your eyes may water, and you may gag. You may have a slight nosebleed from irritation.  After the catheter is in place, you may be asked to lie on your back on an exam table, or you may be asked to remain seated.  You then swallow small sips of water. As you do, a computer connected to the catheter records the pressure, strength and pattern of your esophageal muscle contractions.  During the test, you'll be asked to breathe slowly and smoothly, remain as still as possible, and swallow only when you're asked to do so.  A member of your health care team may move the catheter down into your stomach while the catheter continues its measurements.  The catheter then is slowly withdrawn. The test usually lasts 20 to 30 minutes.  After esophageal manometry  When your esophageal manometry is complete, you may return to your normal activities  This test typically takes 30-45 minutes to complete. ________________________________________________________________________________

## 2012-12-05 ENCOUNTER — Telehealth: Payer: Self-pay | Admitting: Pulmonary Disease

## 2012-12-05 NOTE — Telephone Encounter (Signed)
I spoke with pt. She stated she has 1 day of cipro left. She is achy all over, behind her neck and shoulder blades hurt, slight cough w/ occasional clear phlem (taking tussionex and it helps) wheezing and chest tx comes and goes, hoarseness, chest congestion. She is not taking mucinex but she is taking ibuprofen off and on. Pt had bronch 11/20/12 by Dr. Kendrick Fries. She is wanting to make sure she is on the right track. She did not want to come to [facility] as she was here yesterday seeing GI. Please advise Dr. Maple Hudson thanks Pending appt 01/14/13 Allergies  Allergen Reactions  . Shellfish Allergy Anaphylaxis  . Asa [Aspirin]   . Avelox [Moxifloxacin]     Increased heart rate  . Eggs Or Egg-Derived Products     GI Upset  . Levaquin [Levofloxacin In D5w]

## 2012-12-05 NOTE — Telephone Encounter (Signed)
I spoke with pt and is aware. She voiced her understanding and needed nothing further. I will also forward to Dr. Kendrick Fries as an Lorain Childes

## 2012-12-05 NOTE — Telephone Encounter (Signed)
Ok to finish the Cipro, take mucinex and ibuprofen as needed, follow-up with Dr Kendrick Fries as directed.

## 2012-12-05 NOTE — Telephone Encounter (Signed)
Please let her know that we will work her in next Tuesday if she is not better by Monday

## 2012-12-06 NOTE — Telephone Encounter (Signed)
i spoke with pt. She went ahead and scheduled appt with BQ Tuesday.

## 2012-12-10 ENCOUNTER — Ambulatory Visit: Payer: BC Managed Care – PPO | Admitting: Pulmonary Disease

## 2012-12-10 ENCOUNTER — Encounter: Payer: Self-pay | Admitting: Pulmonary Disease

## 2012-12-10 ENCOUNTER — Ambulatory Visit (INDEPENDENT_AMBULATORY_CARE_PROVIDER_SITE_OTHER): Payer: BC Managed Care – PPO | Admitting: Pulmonary Disease

## 2012-12-10 ENCOUNTER — Ambulatory Visit (INDEPENDENT_AMBULATORY_CARE_PROVIDER_SITE_OTHER)
Admission: RE | Admit: 2012-12-10 | Discharge: 2012-12-10 | Disposition: A | Discharge status: Home / Self Care | Payer: BC Managed Care – PPO | Source: Ambulatory Visit | Attending: Pulmonary Disease | Admitting: Pulmonary Disease

## 2012-12-10 VITALS — BP 104/64 | HR 68 | Temp 98.1°F | Ht 63.5 in | Wt 104.0 lb

## 2012-12-10 DIAGNOSIS — J690 Pneumonitis due to inhalation of food and vomit: Secondary | ICD-10-CM

## 2012-12-10 DIAGNOSIS — R05 Cough: Secondary | ICD-10-CM

## 2012-12-10 DIAGNOSIS — R059 Cough, unspecified: Secondary | ICD-10-CM

## 2012-12-10 DIAGNOSIS — J8410 Pulmonary fibrosis, unspecified: Secondary | ICD-10-CM

## 2012-12-10 MED ORDER — AMOXICILLIN-POT CLAVULANATE 250-62.5 MG/5ML PO SUSR: 250.0000 mg | Freq: Three times a day (TID) | ORAL | Status: DC

## 2012-12-10 MED ORDER — PREDNISONE 10 MG PO TABS: ORAL_TABLET | ORAL | Status: DC

## 2012-12-10 MED ORDER — PREDNISONE 10 MG PO TABS: 30.0000 mg | ORAL_TABLET | Freq: Every day | ORAL | Status: DC

## 2012-12-10 NOTE — Assessment & Plan Note (Signed)
Anne [name]'s case is clearly complicated.  Her bronch showed "stromal fibrosis" and multinucleated giant cells were seen on the BAL cytology.  DDx here is likely hypersensitivity pneumonitis vs sarcoid.  We have not been able to identify from history or serology a cause of hypertsensitivity.  She even had her home checked for mold since the bronch... Nothing there.  Chronic aspiration appears to be a given and (I think) a second process giving her trouble.  So, we need to see if this is a steroid responsive problem.  She does not appear to have pneumonia on exam as detailed below.  Plan: -start prednisone for a three month course -f/u with [place] in 6 weeks to see how things are going -if markedly worse and clearly infected, then will need PICC, anti-pseudomonal IV antibiotics, and GJ tube and made NPO

## 2012-12-10 NOTE — Assessment & Plan Note (Signed)
" >>  ASSESSMENT AND PLAN FOR PULMONARY FIBROSIS ([facility]CC) WRITTEN ON 12/10/2012 10:10 AM BY Gregroy Dombkowski B, MD  Anne [name]'s case is clearly complicated.  Her bronch showed stromal fibrosis and multinucleated giant cells were seen on the BAL cytology.  DDx here is likely hypersensitivity pneumonitis vs sarcoid.  We have not been able to identify from history or serology a cause of hypertsensitivity.  She even had her home checked for mold since the bronch... Nothing there.  Chronic aspiration appears to be a given and (I think) a second process giving her trouble.  So, we need to see if this is a steroid responsive problem.  She does not appear to have pneumonia on exam as detailed below.  Plan: -start prednisone  for a three month course -f/u with us  in 6 weeks to see how things are going -if markedly worse and clearly infected, then will need PICC, anti-pseudomonal IV antibiotics, and GJ tube and made NPO "

## 2012-12-10 NOTE — Addendum Note (Signed)
Addended by: Caryl Ada on: 12/10/2012 10:32 AM   Modules accepted: Orders

## 2012-12-10 NOTE — Assessment & Plan Note (Signed)
She had pseudomonas on her BAL recently which we treated with Cipro.  She thinks that helped some, but she continues to have a lot of cough, dyspnea, hoarseness.    Today she describes a lot of sinus, laryngeal, and tracheal symptoms today.  She is not febrile or toxic appearing to suggest pneumonia.  While aspiration is ongoing, I think today's symptoms are mostly tracheobronchitis.  Plan: -sinus toilette -augmentin x7 days -CXR to look for new infiltrate/pneumonia -continue plan for esophageal manometry as directed by Dr. Rhea Belton

## 2012-12-10 NOTE — Progress Notes (Signed)
Subjective:    Patient ID: Anne [name], female    DOB: Feb 05, 1951, 62 y.o.   MRN: [id number]  Synopsis: She enjoys establish care with the [facility] pulmonary clinic in may of 2014. 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 A CT scan in 2013 performed at [facility] showed some groundglass changes, interstitial thickening, and interlobular thickening in the upper lobes predominantly.  HPI   08/13/2012 ROV >> [name] has had chest congestion, cough, and increasing shortness of breath since Saturday. She has been taking Mucinex for this but it does not help. This is been associated with muscle aches, and fatigue. She has multiple joint aches, and states that her ring finger PIP has been swollen for the last several weeks. She also notes a new rash on her face which is red. She came to our clinic today as a sick visit but also would like to followup the results of the CT scan which was performed last week.  09/03/2012 ROV >> [name] has been doing well since our last visit. After taking the antibiotic her cough went away. She still occasionally has a dry cough with singing and while eating meals. She has noted a significant amount of aspiration in the last several months. She has had chronic difficulty with swallowing. She often feels things like pills in certain foods getting stuck in her chest. She continues to get B12 shots and has done this for years. Her weight has been stable lately. She is continuing to use her medications as documented below. She has not been bleeding from anywhere as far she can tell.  11/05/2012  ROV >> [name] has been having increasing cough, chest congestion and dyspnea for the last week. She's had chills but no fever. She states that some food still gets stuck when she swallows. She has had some sinus congestion but not much postnasal drip. She has not had a significant productive cough. She feels some soreness in the right upper chest/back from coughing.  12/10/2012 ROV >> Since the bronch [name] completed the Cipro > ten days total (finished on Friday of last week) > she said that it helped some.  She feels like she has congetsion between nose and chest. She has been feeling stopped up in head.  She has had to use inhaler some and even had to open her trach a couple of times.  She said that using her inhaler helped some.  She has noted some yellow color to sputum. She has noted chills, but no fever.   Past Medical History  Diagnosis Date  . Asthma   . COPD (chronic obstructive pulmonary disease)   . Stroke   . Migraine   . Allergic rhinitis   . Anemia   . IBS (irritable bowel syndrome)      Review of Systems  Constitutional: Positive for chills and fatigue. Negative for fever.  HENT: Positive for congestion, rhinorrhea and sinus pressure. Negative for postnasal drip.   Respiratory: Positive for cough and shortness of breath. Negative for chest tightness.   Cardiovascular: Positive for chest pain. Negative for palpitations and leg swelling.  Objective:   Physical Exam  Filed Vitals:   12/10/12 0909  BP: 104/64  Pulse: 68  Temp: 98.1 F (36.7 C)  TempSrc: Oral  Height: 5' 3.5" (1.613 m)  Weight: 104 lb (47.174 kg)  SpO2: 100%   Gen:  Mildly ill appearing HEENT: NCAT, EOMi, OP clear,  PULM: upper airway wheezing; crackles upper lobes, good air movement  CV: RRR, no mgr, no JVD AB: BS+, soft, nontender, no hsm Ext: warm, no edema, no clubbing, no cyanosis   [facility]>> emphysema bilaterally; upper lobe groundglass  opacification, interstitial thickening, interlobular thickening, and apical cappingnoted bilaterally. This is significantly worsened since the 2007 comparison study. 07/2012 CT chest [facility] >> upper lobe interstitial thickening, consolidation, peripepheral and apical in distribution slightly increased from prior; also new clusters of ggo nodules in the RLL 07/2012 ANA neg, RF < 10, ESR 55 (high), CRP 10.6, IgE 3.1 (normal), SSB < 1, SSA 4 normal, SCL-70 neg, Crypto Ag neg, Anti-Jo-1 <0.2; CBC with diff > mild anemia, no eosinophilia 07/2012 HP panel negative 07/2012 Aspergillus Ab panel/precipitins negative 08/2012 1300 feet rest HR 72, O2 97%, 6 min HR 119, O2 sat 88% 10/2012 Bronch> pseudomonas on BAL; multinucleated giant cell on BAL cytology; TBBX histology "stromal fibrosis"     Assessment & Plan:   Postinflammatory pulmonary fibrosis [name]'s case is clearly complicated.  Her bronch showed "stromal fibrosis" and multinucleated giant cells were seen on the BAL cytology.  DDx here is likely hypersensitivity pneumonitis vs sarcoid.  We have not been able to identify from history or serology a cause of hypertsensitivity.  She even had her home checked for mold since the bronch... Nothing there.  Chronic aspiration appears to be a given and (I think) a second process giving her trouble.  So, we need to see if this is a steroid responsive problem.  She does not appear to have pneumonia on exam as detailed below.  Plan: -start prednisone for a three month course -f/u with [place] in 6 weeks to see how things are going -if markedly worse and clearly infected, then will need PICC, anti-pseudomonal IV antibiotics, and GJ tube and made NPO  Aspiration pneumonia She had pseudomonas on her BAL recently which we treated with Cipro.  She thinks that helped some, but she continues to have a lot of cough, dyspnea, hoarseness.    Today she describes a lot of sinus, laryngeal, and tracheal symptoms today.  She  is not febrile or toxic appearing to suggest pneumonia.  While aspiration is ongoing, I think today's symptoms are mostly tracheobronchitis.  Plan: -sinus toilette -augmentin x7 days -CXR to look for new infiltrate/pneumonia -continue plan for esophageal manometry as directed by Dr. Rhea Belton     Updated Medication List Outpatient Encounter Prescriptions as of 12/10/2012  Medication Sig Dispense Refill  . albuterol (PROVENTIL HFA;VENTOLIN HFA) 108 (90 BASE) MCG/ACT inhaler Inhale 2 puffs into the lungs every 6 (six) hours as needed for wheezing.      [name] albuterol (PROVENTIL) (2.5 MG/3ML) 0.083% nebulizer solution Take 2.5 mg by nebulization every 6 (six) hours as needed for wheezing.      [name] ALPRAZolam (XANAX) 0.25 MG tablet Take 0.25 mg by mouth 2 (two) times daily as needed for sleep.      [name] Alum & Mag Hydroxide-Simeth (MAGIC MOUTHWASH) SOLN Take 5 mLs by mouth 3 (three) times daily as needed.      . Artificial Tear Ointment (ARTIFICIAL TEARS)  ointment as needed.      [name] azelastine (OPTIVAR) 0.05 % ophthalmic solution Place 1 drop into both eyes 2 (two) times daily.      . budesonide-formoterol (SYMBICORT) 160-4.5 MCG/ACT inhaler Inhale 2 puffs into the lungs 2 (two) times daily.      . cetirizine (ZYRTEC) 10 MG tablet Take 10 mg by mouth daily.      . chlorpheniramine-HYDROcodone (TUSSIONEX) 10-8 MG/5ML LQCR Take 5 mLs by mouth every 12 (twelve) hours as needed.      . clopidogrel (PLAVIX) 75 MG tablet Take 75 mg by mouth daily.      . Cyanocobalamin (VITAMIN B-12 IJ) Inject as directed every 30 (thirty) days.      [name] estrogens, conjugated, (PREMARIN) 0.625 MG tablet Take 0.625 mg by mouth daily. Take daily for 21 days then do not take for 7 days.      . fluticasone (FLONASE) 50 MCG/ACT nasal spray Place 2 sprays into the nose as needed.       . furosemide (LASIX) 20 MG tablet Take 20 mg by mouth daily.      . montelukast (SINGULAIR) 10 MG tablet Take 10 mg by mouth at bedtime.       No  facility-administered encounter medications on file as of 12/10/2012.

## 2012-12-10 NOTE — Patient Instructions (Signed)
Take the augmentin solution for one week with yogurt Use neil med or netti pot sinus rinses, over the counter decongestants (phenylephrine), and mucinex Take the prednisone daily as written Go to [facility] for the Chest X-ray  WE will see you back in 6 weeks or sooner if needed

## 2012-12-11 ENCOUNTER — Ambulatory Visit: Payer: BC Managed Care – PPO | Admitting: Internal Medicine

## 2012-12-11 ENCOUNTER — Ambulatory Visit: Payer: Self-pay | Admitting: Internal Medicine

## 2012-12-13 ENCOUNTER — Encounter: Payer: Self-pay | Admitting: Internal Medicine

## 2012-12-13 ENCOUNTER — Ambulatory Visit (INDEPENDENT_AMBULATORY_CARE_PROVIDER_SITE_OTHER): Payer: BC Managed Care – PPO | Admitting: Internal Medicine

## 2012-12-13 VITALS — BP 120/70 | HR 62 | Temp 97.8°F | Ht 63.0 in | Wt 105.0 lb

## 2012-12-13 DIAGNOSIS — J4489 Other specified chronic obstructive pulmonary disease: Secondary | ICD-10-CM

## 2012-12-13 DIAGNOSIS — J449 Chronic obstructive pulmonary disease, unspecified: Secondary | ICD-10-CM

## 2012-12-13 DIAGNOSIS — Z1239 Encounter for other screening for malignant neoplasm of breast: Secondary | ICD-10-CM

## 2012-12-13 DIAGNOSIS — Z930 Tracheostomy status: Secondary | ICD-10-CM

## 2012-12-13 DIAGNOSIS — J8410 Pulmonary fibrosis, unspecified: Secondary | ICD-10-CM

## 2012-12-13 DIAGNOSIS — J690 Pneumonitis due to inhalation of food and vomit: Secondary | ICD-10-CM

## 2012-12-13 DIAGNOSIS — E785 Hyperlipidemia, unspecified: Secondary | ICD-10-CM

## 2012-12-13 DIAGNOSIS — G4700 Insomnia, unspecified: Secondary | ICD-10-CM

## 2012-12-13 LAB — COMPREHENSIVE METABOLIC PANEL
ALT: 18 U/L (ref 0–35)
AST: 30 U/L (ref 0–37)
Albumin: 3.9 g/dL (ref 3.5–5.2)
Alkaline Phosphatase: 72 U/L (ref 39–117)
BUN: 15 mg/dL (ref 6–23)
CO2: 32 mEq/L (ref 19–32)
Calcium: 9 mg/dL (ref 8.4–10.5)
Chloride: 97 mEq/L (ref 96–112)
Creatinine, Ser: 0.9 mg/dL (ref 0.4–1.2)
GFR: 65.47 mL/min (ref >60.00)
Glucose, Bld: 84 mg/dL (ref 70–99)
Potassium: 3 mEq/L — ABNORMAL LOW (ref 3.5–5.1)
Sodium: 136 mEq/L (ref 135–145)
Total Bilirubin: 0.6 mg/dL (ref 0.3–1.2)
Total Protein: 6.8 g/dL (ref 6.0–8.3)

## 2012-12-13 LAB — LIPID PANEL
Cholesterol: 201 mg/dL — ABNORMAL HIGH (ref 0–200)
HDL: 91.5 mg/dL (ref >39.00)
Total CHOL/HDL Ratio: 2
Triglycerides: 101 mg/dL (ref 0.0–149.0)
VLDL: 20.2 mg/dL (ref 0.0–40.0)

## 2012-12-13 LAB — LDL CHOLESTEROL, DIRECT: Direct LDL: 92.7 mg/dL

## 2012-12-13 NOTE — Progress Notes (Signed)
Subjective:    Patient ID: Anne [name], female    DOB: 1950/10/01, 62 y.o.   MRN: [id number]  HPI 62 year old female with history of pulmonary fibrosis, asthma, chronic aspiration, trach dependence, history of stroke presents to establish care. She reports that she had asthma throughout her life but developed severe exacerbation approximately 15 years ago requiring trach placement. Since that time, she has been diagnosed with both asthma/COPD and pulmonary fibrosis possibly secondary to chronic aspiration pneumonia. There've been 2 Attempts to place a feeding tube and both times this is been unsuccessful. She is currently taking nutrition orally but has difficulty swallowing, particularly hard foods like meats. Her diet is very limited. She eats frequent meals but has had difficulty maintaining her weight. At her last visit with her pulmonologist, she was instructed to start both Augmentin and prednisone. She started Augmentin but has held off on prednisone because she is concerned about side effects including insomnia and flushing. She notes that she has chronic insomnia which is made worse by medications. She currently wakes up during the night and has difficulty falling back asleep. She has tried using alprazolam for this with no improvement. She has never taken prescription medication for this. She is considering trying over-the-counter melatonin. She denies any recent fever, chills, chest pain. She does have mild shortness of breath and occasional wheezing. She has occasional nonproductive cough.  In regards to her history of stroke, she reports that she had 2 separate episodes about 5 years ago in which she developed slurred speech and left-sided weakness. She was ultimately evaluated at [facility] by neurologist who felt that her symptoms were consistent with stroke and even though MRI imaging of the brain was normal. He also felt that her symptoms might be consistent with MS however testing was negative.  Since that time, she has been taking Plavix daily. She was initially started on aspirin however is allergic to aspirin and could not tolerate it. She denies any recent symptoms of focal neurologic changes, slurred speech.  In regards to health maintenance, she is overdue for mammogram and lipid profile. She has not yet had her flu shot but was told that this might not be safe for her.  Outpatient Encounter Prescriptions as of 12/13/2012  Medication Sig Dispense Refill  . albuterol (PROVENTIL HFA;VENTOLIN HFA) 108 (90 BASE) MCG/ACT inhaler Inhale 2 puffs into the lungs every 6 (six) hours as needed for wheezing.      [name] albuterol (PROVENTIL) (2.5 MG/3ML) 0.083% nebulizer solution Take 2.5 mg by nebulization every 6 (six) hours as needed for wheezing.      [name] ALPRAZolam (XANAX) 0.25 MG tablet Take 0.25 mg by mouth 2 (two) times daily as needed for sleep.      [name] Alum & Mag Hydroxide-Simeth (MAGIC MOUTHWASH) SOLN Take 5 mLs by mouth 3 (three) times daily as needed.      [name] amoxicillin-clavulanate (AUGMENTIN) 250-62.5 MG/5ML suspension Take 5 mLs (250 mg total) by mouth every 8 (eight) hours.  150 mL  0  . Artificial Tear Ointment (ARTIFICIAL TEARS) ointment as needed.      [name] azelastine (OPTIVAR) 0.05 % ophthalmic solution Place 1 drop into both eyes 2 (two) times daily.      . cetirizine (ZYRTEC) 10 MG tablet Take 10 mg by mouth daily.      . chlorpheniramine-HYDROcodone (TUSSIONEX) 10-8 MG/5ML LQCR Take 5 mLs by mouth every 12 (twelve) hours as needed.      . clopidogrel (PLAVIX) 75 MG tablet Take  75 mg by mouth daily.      . Cyanocobalamin (VITAMIN B-12 IJ) Inject as directed every 30 (thirty) days.      [name] estrogens, conjugated, (PREMARIN) 0.625 MG tablet Take 0.625 mg by mouth daily. Take daily for 21 days then do not take for 7 days.      . fluticasone (FLONASE) 50 MCG/ACT nasal spray Place 2 sprays into the nose as needed.       . furosemide (LASIX) 20 MG tablet Take 20 mg by mouth daily.      .  montelukast (SINGULAIR) 10 MG tablet Take 10 mg by mouth at bedtime.      . budesonide-formoterol (SYMBICORT) 160-4.5 MCG/ACT inhaler Inhale 2 puffs into the lungs 2 (two) times daily.      . predniSONE (DELTASONE) 10 MG tablet Take 30mg  daily for one month, 20mg  daily for one month, 10mg  daily for one month  90 tablet  2   No facility-administered encounter medications on file as of 12/13/2012.   BP 120/70  Pulse 62  Temp(Src) 97.8 F (36.6 C) (Oral)  Ht 5\' 3"  (1.6 m)  Wt 105 lb (47.628 kg)  BMI 18.6 kg/m2  SpO2 98%  Review of Systems  Constitutional: Negative for fever, chills, appetite change, fatigue and unexpected weight change.  HENT: Positive for trouble swallowing. Negative for ear pain, congestion, sore throat, neck pain, voice change and sinus pressure.   Eyes: Negative for visual disturbance.  Respiratory: Positive for shortness of breath and wheezing. Negative for cough and stridor.   Cardiovascular: Negative for chest pain, palpitations and leg swelling.  Gastrointestinal: Negative for nausea, vomiting, abdominal pain, diarrhea, constipation, blood in stool, abdominal distention and anal bleeding.  Genitourinary: Negative for dysuria and flank pain.  Musculoskeletal: Negative for myalgias, arthralgias and gait problem.  Skin: Negative for color change and rash.  Neurological: Negative for dizziness and headaches.  Hematological: Negative for adenopathy. Does not bruise/bleed easily.  Psychiatric/Behavioral: Negative for suicidal ideas, sleep disturbance and dysphoric mood. The patient is not nervous/anxious.        Objective:   Physical Exam  Constitutional: She is oriented to person, place, and time. She appears well-developed and well-nourished. No distress.  HENT:  Head: Normocephalic and atraumatic.    Right Ear: External ear normal.  Left Ear: External ear normal.  Nose: Nose normal.  Mouth/Throat: Oropharynx is clear and moist. No oropharyngeal exudate.    Eyes: Conjunctivae are normal. Pupils are equal, round, and reactive to light. Right eye exhibits no discharge. Left eye exhibits no discharge. No scleral icterus.  Neck: Normal range of motion. Neck supple. No tracheal deviation present. No thyromegaly present.  Cardiovascular: Normal rate, regular rhythm, normal heart sounds and intact distal pulses.  Exam reveals no gallop and no friction rub.   No murmur heard. Pulmonary/Chest: Effort normal. No accessory muscle usage. Not tachypneic. No respiratory distress. She has decreased breath sounds. She has wheezes (scattered). She has no rhonchi. She has no rales. She exhibits no tenderness.  Musculoskeletal: Normal range of motion. She exhibits no edema and no tenderness.  Lymphadenopathy:    She has no cervical adenopathy.  Neurological: She is alert and oriented to person, place, and time. No cranial nerve deficit. She exhibits normal muscle tone. Coordination normal.  Skin: Skin is warm and dry. No rash noted. She is not diaphoretic. No erythema. No pallor.  Psychiatric: She has a normal mood and affect. Her behavior is normal. Judgment and thought content normal.  Assessment & Plan:

## 2012-12-13 NOTE — Assessment & Plan Note (Signed)
History reviewed. Continues to have wheezing and poor air movement on exam. Encouraged her to discuss with pulmonary physician her decision to hold off on use of prednisone. Encouraged her to complete augmentin.

## 2012-12-13 NOTE — Assessment & Plan Note (Signed)
Prolonged h/o aspiration pneumonia. Currently on Augmentin as prescribed by pulmonary. Planning for esophageal manometry with GI next week. May ultimately need feeding tube.

## 2012-12-13 NOTE — Assessment & Plan Note (Addendum)
" >>  ASSESSMENT AND PLAN FOR ASTHMA-COPD OVERLAP SYNDROME (HCC) WRITTEN ON 12/13/2012  1:07 PM BY Lum Stillinger A, MD  Poor air movement with wheezing noted on exam today. Has not started prednisone  as prescribed by pulmonary physician, because of concerns about side effects. She is taking Augmentin . Will d/w pulmonary physician re:flu vaccine. Encouraged her to discuss her concerns about prednisone  with pulmonary physician. Discussed medication to help with insomnia associated with prednisone .   >>ASSESSMENT AND PLAN FOR PULMONARY FIBROSIS (HCC) WRITTEN ON 12/13/2012  1:09 PM BY VANNIE NEST A, MD  History reviewed. Continues to have wheezing and poor air movement on exam. Encouraged her to discuss with pulmonary physician her decision to hold off on use of prednisone . Encouraged her to complete augmentin . "

## 2012-12-13 NOTE — Assessment & Plan Note (Signed)
Chronic, exacerbated by use of Augmentin. Pt would like to try OTC melatonin. Discussed potential prescription medication to help with insomnia as well. Will monitor. Pt will call if no improvement with melatonin.

## 2012-12-13 NOTE — Assessment & Plan Note (Signed)
Will schedule mammogram 

## 2012-12-13 NOTE — Assessment & Plan Note (Signed)
Will check lipids and LFTS with labs today.

## 2012-12-16 ENCOUNTER — Ambulatory Visit (HOSPITAL_COMMUNITY)
Admission: RE | Admit: 2012-12-16 | Discharge: 2012-12-16 | Disposition: A | Discharge status: Home / Self Care | Payer: BC Managed Care – PPO | Source: Ambulatory Visit | Attending: Internal Medicine | Admitting: Internal Medicine

## 2012-12-16 ENCOUNTER — Ambulatory Visit: Payer: BC Managed Care – PPO

## 2012-12-16 ENCOUNTER — Telehealth: Payer: Self-pay | Admitting: *Deleted

## 2012-12-16 ENCOUNTER — Encounter (HOSPITAL_COMMUNITY)
Admission: RE | Disposition: A | Discharge status: Home / Self Care | Payer: Self-pay | Source: Ambulatory Visit | Attending: Internal Medicine

## 2012-12-16 DIAGNOSIS — K224 Dyskinesia of esophagus: Secondary | ICD-10-CM | POA: Insufficient documentation

## 2012-12-16 DIAGNOSIS — R1310 Dysphagia, unspecified: Secondary | ICD-10-CM

## 2012-12-16 HISTORY — PX: ESOPHAGEAL MANOMETRY: SHX5429

## 2012-12-16 SURGERY — MANOMETRY, ESOPHAGUS
Anesthesia: Topical

## 2012-12-16 MED ORDER — LIDOCAINE VISCOUS 2 % MT SOLN
OROMUCOSAL | Status: AC
  Filled 2012-12-16: qty 15

## 2012-12-16 SURGICAL SUPPLY — 4 items
DRAPE UTILITY 15X26 W/TAPE STR (DRAPE) ×2 IMPLANT
GLOVE BIOGEL PI IND STRL 8 (GLOVE) ×1 IMPLANT
GLOVE BIOGEL PI INDICATOR 8 (GLOVE) ×1
GOWN PREVENTION PLUS LG XLONG (DISPOSABLE) ×2 IMPLANT

## 2012-12-16 NOTE — Telephone Encounter (Signed)
Message copied by Theola Sequin on Mon Dec 16, 2012  8:18 AM ------      Message from: Ronna Polio A      Created: Sun Dec 15, 2012 10:49 AM      Regarding: FW: Flu Shot       Okey Regal, Can you let Ms. Awadallah know that Dr. Kendrick Fries was fine with her getting her flu shot?      ----- Message -----         From: Lupita Leash, MD         Sent: 12/14/2012   8:52 AM           To: Wynona Dove, MD      Subject: RE: Flu Shot                                             Flu shot is fine with me!            ----- Message -----         From: Wynona Dove, MD         Sent: 12/13/2012   1:02 PM           To: Lupita Leash, MD      Subject: Flu Shot                                                 Saw her as a new pt today.      How do you feel about her getting a flu shot?      She has not started the Prednisone you prescribed. She was worried about side effects of insomnia. I offered to add medication to help with sleep.                   ------

## 2012-12-16 NOTE — Telephone Encounter (Signed)
Patient informed it will be ok to get flu shot, scheduled to come in today.

## 2012-12-17 ENCOUNTER — Telehealth: Payer: Self-pay | Admitting: Internal Medicine

## 2012-12-17 ENCOUNTER — Encounter (HOSPITAL_COMMUNITY): Payer: Self-pay | Admitting: Internal Medicine

## 2012-12-17 NOTE — Telephone Encounter (Signed)
This lady was seen 12/14/12 for dysphagia; she has a permanent trach, not in use and has 2 previous feeding tubes. She has an EM yesterday and wants to know the resulls. She states she spoke with Jesusita Oka who instructed her to eat small meals until the results are back. Pt is going out of town Thursday and wonders if we could have results before then? Thanks.

## 2012-12-18 ENCOUNTER — Encounter: Payer: Self-pay | Admitting: Internal Medicine

## 2012-12-18 LAB — FUNGUS CULTURE W SMEAR
Fungal Smear: NONE SEEN
Special Requests: NORMAL

## 2012-12-18 NOTE — Telephone Encounter (Signed)
Informed pt of Dr Lauro Franklin findings on the EM; Per Dr Rhea Belton,:  Esophageal dysmotility with abnormal esophageal peristalsis and elevated residual pressure at the lower esophageal sphincter. 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 I spoke with Dr Rhea Belton who stated he would like to see the pt to discuss the manometry results and she may benefit from botox injections. Pt is scheduled to see Dr Rhea Belton on 01/10/13, but we are going to ask him if he will consider doing BOTOX next week while he has hospital duty. Well, Dr Rhea Belton, could you do BOTOX next week? Thanks.

## 2012-12-19 ENCOUNTER — Ambulatory Visit (INDEPENDENT_AMBULATORY_CARE_PROVIDER_SITE_OTHER): Payer: Medicare Other | Admitting: *Deleted

## 2012-12-19 DIAGNOSIS — Z23 Encounter for immunization: Secondary | ICD-10-CM

## 2012-12-19 NOTE — Telephone Encounter (Signed)
Would like to discuss options for treatment with the patient in person before proceeding to procedure for Botox injection Options include surgery versus attempt at endoscopic therapy

## 2012-12-19 NOTE — Telephone Encounter (Signed)
Plavix will need to be held before procedure, this can be discussed at office visit Soft foods, small and more freq meals, chew food extremely well, allow extra time for meals. Formal swallowing evaluation with speech therapy can be repeated if she desires.

## 2012-12-19 NOTE — Telephone Encounter (Signed)
Discussed Dr Lauro Franklin comments to pt and she agrees to be seen to discuss her options. She asks if you can recommend foods she can eat? She has been seen by Speech before and they will not recommend anything because aspirates.

## 2012-12-19 NOTE — Progress Notes (Signed)
Quick Note:  Spoke with pt and notified of results per Dr. Wert. Pt verbalized understanding and denied any questions.  ______ 

## 2012-12-20 ENCOUNTER — Telehealth: Payer: Self-pay | Admitting: Internal Medicine

## 2012-12-20 NOTE — Telephone Encounter (Signed)
Informed pt of Dr Lauro Franklin suggestions; she stated understanding and will discuss all of these items then.

## 2012-12-20 NOTE — Telephone Encounter (Signed)
FYI-see below- 

## 2012-12-20 NOTE — Telephone Encounter (Signed)
Pt calling to inform that she had to use her inhaler through the night for breathing trouble/throat closing after receiving flu shot.  Pt states she is doing okay but would like this noted in her chart.  Pt states she would still probably receive the flu shot again in the future.

## 2012-12-20 NOTE — Telephone Encounter (Signed)
Fwd to Dr. Walker 

## 2012-12-23 ENCOUNTER — Other Ambulatory Visit: Payer: Self-pay | Admitting: Internal Medicine

## 2012-12-23 NOTE — Telephone Encounter (Signed)
Eprescribed.

## 2012-12-23 NOTE — Telephone Encounter (Signed)
estrogens, conjugated, (PREMARIN) 0.625 MG tablet

## 2012-12-25 ENCOUNTER — Telehealth: Payer: Self-pay | Admitting: Pulmonary Disease

## 2012-12-25 NOTE — Telephone Encounter (Signed)
I spoke with pt. She reports she has not started the prednisone yet. She also reports she feels uneasy about being on prednisone x 3 months. She is going to start the prednisone next week when she comes back from the beach. She has pending appt 01/14/13. She is going to keep her upcoming appt. She stated she does not want to be at the beach and get sick with the prednisone bc she stated at times this breaks her face her out. This is FYI for this.

## 2013-01-02 LAB — AFB CULTURE WITH SMEAR (NOT AT ARMC)
Acid Fast Smear: NONE SEEN
Special Requests: NORMAL

## 2013-01-06 ENCOUNTER — Telehealth: Payer: Self-pay | Admitting: Pulmonary Disease

## 2013-01-06 DIAGNOSIS — J690 Pneumonitis due to inhalation of food and vomit: Secondary | ICD-10-CM

## 2013-01-06 DIAGNOSIS — J449 Chronic obstructive pulmonary disease, unspecified: Secondary | ICD-10-CM

## 2013-01-06 NOTE — Assessment & Plan Note (Deleted)
Start spiriva

## 2013-01-06 NOTE — Telephone Encounter (Signed)
Returning cal can be reached at [phone] or [phone].Anne [name]

## 2013-01-06 NOTE — Telephone Encounter (Signed)
ATC pt NA and not able to leave a message. WCB

## 2013-01-06 NOTE — Telephone Encounter (Signed)
Returning call can be reached at [phone] or [phone].Anne [name]

## 2013-01-06 NOTE — Telephone Encounter (Signed)
I spoke with patient about results and she verbalized understanding and had no questions regarding test results.

## 2013-01-08 ENCOUNTER — Encounter: Payer: Self-pay | Admitting: Internal Medicine

## 2013-01-10 ENCOUNTER — Encounter: Payer: Self-pay | Admitting: Internal Medicine

## 2013-01-10 ENCOUNTER — Ambulatory Visit (INDEPENDENT_AMBULATORY_CARE_PROVIDER_SITE_OTHER): Payer: Medicare Other | Admitting: Internal Medicine

## 2013-01-10 VITALS — BP 100/60 | HR 80 | Ht 63.5 in | Wt 105.0 lb

## 2013-01-10 DIAGNOSIS — R1310 Dysphagia, unspecified: Secondary | ICD-10-CM

## 2013-01-10 DIAGNOSIS — Z791 Long term (current) use of non-steroidal anti-inflammatories (NSAID): Secondary | ICD-10-CM

## 2013-01-10 DIAGNOSIS — K224 Dyskinesia of esophagus: Secondary | ICD-10-CM

## 2013-01-10 MED ORDER — LANSOPRAZOLE 3 MG/ML PO SUSP: 10.0000 mL | Freq: Every day | ORAL | Status: DC

## 2013-01-10 NOTE — Progress Notes (Signed)
Subjective:    Patient ID: Anne [name], female    DOB: 02-06-1951, 62 y.o.   MRN: [id number]  HPI Mrs. Leyba is a 62 yo female with PMH of asthma with pulmonary fibrosis status post permanent tracheostomy, history of CVA, migraines, history of aspiration, history of difficulty swallowing, and migraines who is seen in office followup. She is here today with her husband. After her last office visit she had high resolution esophageal manometry performed which showed abnormal esophageal peristalsis with elevated lower esophageal sphincter resting pressure. She continues to have issues with swallowing. This is not significantly changed since her last visit here. She reports it continues to take her a long time to eat and she eats very small amounts at a time. She does continue to have coughing with eating on occasion. She still feels like it takes an extremely long time for her esophagus to clear itself. She does report occasional "random" abdominal discomfort. No true nausea or vomiting. No black stool or melena.  Hx of multiple GI evaluations in the past performed by Dr. Mechele Collin in [place], Dr. Lemar Livings in [place], Dr. Kinnie Scales and [place], and a GI evaluation at [facility].   Review of Systems As per history of present illness, otherwise negative  Current Medications, Allergies, Past Medical History, Past Surgical History, Family History and Social History were reviewed in [employer] record.     Objective:   Physical Exam BP 100/60  Pulse 80  Ht 5' 3.5" (1.613 m)  Wt 105 lb (47.628 kg)  BMI 18.31 kg/m2 Constitutional: Well-developed and thin female. No distress. HEENT: Normocephalic and atraumatic. Tracheostomy in place Neurological: Alert and oriented to person place and time. Psychiatric: Normal mood and affect. Behavior is normal.  Esophageal Mano: Interpretation / Findings  High-resolution esophageal manometry performed on 10  consecutive swallows to evaluate clinical history of dysphagia --Elevated residual LES pressure (17.4 mmHg) with abnormal LES relaxation --Instances of abnormal/ failed esophageal peristalsis, 4 of 10 swallows (also with pan-pressurization)  --Instances of rapid esophageal contraction and incomplete bolus clearance (4 of 10 swallows)  Impressions  1.  Esophageal dysmotility with abnormal esophageal peristalsis and elevated residual pressure at the lower esophageal sphincter.  Possible achalasia variant       Assessment & Plan:   62 yo female with PMH of asthma with pulmonary fibrosis status post permanent tracheostomy, history of CVA, migraines, history of aspiration, history of difficulty swallowing, and migraines who is seen in office followup.   1.  Chronic esophageal dysphagia/dysmotility -- esophageal manometry was performed and did demonstrate elevated residual LES pressure and abnormal LES relaxed sedation. There was also several instances of failed esophageal peristalsis. She may in fact have an achalasia variant. Certainly her esophageal dysmotility contributes to her swallowing difficulties and possibly in part to her aspiration and chronic lung inflammation. Esophageal impedance showed incomplete bolus clearance in 4 of 10 swallows. We have discussed at length today the options for treatment. They are attempt at Botox to the lower esophageal sphincter in 4 quadrant fashion. This usually has a nice response for decreasing LES pressures and improving swallowing dysfunction. We have discussed how this often has to be repeated every 6-12 months as the effect is not permanent.  Another option would be heller myotomy which would be more definitive in long lasting. What is not clear, is that this will improve her aspiration and lung inflammation. She has an appointment with Dr. Kendrick Fries next week, and she would like  to complete this appointment before scheduling her upper endoscopy.  However, it does  seem at this point she would prefer a trial of Botox injection to the LES before proceeding to a more aggressive approach such as myotomy.  He also would like to contact her insurance company and determine what her expense would be. We also briefly discussed a feeding tube, which likely would need to be a jejunal feeding tube, should she continue to have aspiration leading to worsening of her lung function or inability to meet caloric demands.  2.  Prednisone and NSAIDs -- she plans to begin a prednisone taper under the direction of Dr. Kendrick Fries next week. He also is using Motrin on a regular basis. We have discussed the significant risk of gastric or duodenal ulceration when NSAIDs are combined with corticosteroids.  With this in mind, I will start her on lansoprazole suspension 30 mg daily for gastric and small bowel protection while on prednisone.  She will contact us after meeting with Dr. Kendrick Fries to set up her endoscopy

## 2013-01-10 NOTE — Patient Instructions (Signed)
We have sent the following medications to your pharmacy for you to pick up at your convenience: Lansoprazole susp. Take 10ml when taking an NSAID and Prednisone.  We will contact you about pre certification of your EGD with Botox.                                                 We are excited to introduce MyChart, a new best-in-class service that provides you online access to important information in your electronic medical record. We want to make it easier for you to view your health information - all in one secure location - when and where you need it. We expect MyChart will enhance the quality of care and service we provide.  When you register for MyChart, you can:    View your test results.    Request appointments and receive appointment reminders via email.    Request medication renewals.    View your medical history, allergies, medications and immunizations.    Communicate with your physician's office through a password-protected site.    Conveniently print information such as your medication lists.  To find out if MyChart is right for you, please talk to a member of our clinical staff today. We will gladly answer your questions about this free health and wellness tool.  If you are age 63 or older and want a member of your family to have access to your record, you must provide written consent by completing a proxy form available at our office. Please speak to our clinical staff about guidelines regarding accounts for patients younger than age 63.  As you activate your MyChart account and need any technical assistance, please call the MyChart technical support line at [phone]) or email your question to [email or web address]. If you email your question(s), please include your name, a return phone number and the best time to reach you.  If you have non-urgent health-related questions, you can send a message to our office through MyChart at  [email or web address]. If you have a medical emergency, call 911.  Thank you for using MyChart as your new health and wellness resource!   MyChart licensed from [employer],  [id number]. Patents Pending.

## 2013-01-14 ENCOUNTER — Telehealth: Payer: Self-pay | Admitting: *Deleted

## 2013-01-14 ENCOUNTER — Ambulatory Visit (INDEPENDENT_AMBULATORY_CARE_PROVIDER_SITE_OTHER): Payer: Medicare Other | Admitting: Pulmonary Disease

## 2013-01-14 ENCOUNTER — Encounter: Payer: Self-pay | Admitting: Pulmonary Disease

## 2013-01-14 VITALS — BP 112/72 | HR 70 | Temp 97.8°F | Ht 63.5 in | Wt 104.0 lb

## 2013-01-14 DIAGNOSIS — J8410 Pulmonary fibrosis, unspecified: Secondary | ICD-10-CM

## 2013-01-14 DIAGNOSIS — J690 Pneumonitis due to inhalation of food and vomit: Secondary | ICD-10-CM

## 2013-01-14 NOTE — Progress Notes (Signed)
Subjective:    Patient ID: Anne [name], female    DOB: 06-03-1950, 62 y.o.   MRN: [id number]  Synopsis: She enjoys establish care with the [facility] pulmonary clinic in may of 2014. 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 A CT scan in 2013 performed at [facility] showed some groundglass changes, interstitial thickening, and interlobular thickening in the upper lobes predominantly.  HPI   08/13/2012 ROV >> Anne [name] has had chest congestion, cough, and increasing shortness of breath since Saturday. She has been taking Mucinex for this but it does not help. This is been associated with muscle aches, and fatigue. She has multiple joint aches, and states that her ring finger PIP has been swollen for the last several weeks. She also notes a new rash on her face which is red. She came to our clinic today as a sick visit but also would like to followup the results of the CT scan which was performed last week.  09/03/2012 ROV >> Anne [name] has been doing well since our last visit. After taking the antibiotic her cough went away. She still occasionally has a dry cough with singing and while eating meals. She has noted a significant amount of aspiration in the last several months. She has had chronic difficulty with swallowing. She often feels things like pills in certain foods getting stuck in her chest. She continues to get B12 shots and has done this for years. Her weight has been stable lately. She is continuing to use her medications as documented below. She has not been bleeding from anywhere as far she can tell.  11/05/2012  ROV >> Anne [name] has been having increasing cough, chest congestion and dyspnea for the last week. She's had chills but no fever. She states that some food still gets stuck when she swallows. She has had some sinus congestion but not much postnasal drip. She has not had a significant productive cough. She feels some soreness in the right upper chest/back from coughing.  12/10/2012 ROV >> Since the bronch Anne [name] completed the Cipro > ten days total (finished on Friday of last week) > she said that it helped some.  She feels like she has congetsion between nose and chest. She has been feeling stopped up in head.  She has had to use inhaler some and even had to open her trach a couple of times.  She said that using her inhaler helped some.  She has noted some yellow color to sputum. She has noted chills, but no fever.  01/14/2013 ROV >> Anne [name] still hasn't started the prednisone yet because she doesn't like the way it affects her.  She has had a flu shot since the last visit.  She has been seeing Dr. Hilarie Fredrickson and has plans for an endoscopy.  She says that when she was at the beach for ten days she continued to have breathing problems.  These included wheezing, feeling chest congestion and the feeling that she couldn't get a deep breath.  She has been having a lot of joint (knee, joint, shoulder pain) lately.  She had throat swelling after her flu shot this time  that lasted for a few minutes after.     Past Medical History  Diagnosis Date  . Asthma   . COPD (chronic obstructive pulmonary disease)   . Stroke     slurred speech, drawn face, imaging normal, occurred twice, [facility]  . Migraine   . Allergic rhinitis   . Anemia   . IBS (irritable bowel syndrome)   . Shingles      Review of Systems  Constitutional: Positive for fatigue. Negative for fever and chills.  HENT: Negative for congestion, postnasal drip, rhinorrhea and sinus pressure.   Respiratory: Positive for cough and shortness of breath.  Negative for chest tightness.   Cardiovascular: Negative for chest pain, palpitations and leg swelling.       Objective:   Physical Exam  Filed Vitals:   01/14/13 0936  BP: 112/72  Pulse: 70  Temp: 97.8 F (36.6 C)  TempSrc: Oral  Height: 5' 3.5" (1.613 m)  Weight: 104 lb (47.174 kg)  SpO2: 100%   Gen:  Well appearing HEENT: NCAT, EOMi, OP clear,  PULM: upper airway wheezing; no rhonchi or crackles, good air movement  CV: RRR, no mgr, no JVD AB: BS+, soft, nontender, no hsm Ext: warm, no edema, no clubbing, no cyanosis   04/2011 [facility]>> emphysema bilaterally; upper lobe groundglass opacification, interstitial thickening, interlobular thickening, and apical cappingnoted bilaterally. This is significantly worsened since the 2007 comparison study. 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  This is clearly an inflammatory process given the multi-nucleated giant cells seen from her bronch.  DDx includes hypersensitivity pneumonitis vs sarcoid.  Hypersensitivity is less likely given no improvement with a recent ten day trip to the beach.  At this point I feel strongly that she needs a therapeutic  trial of prednisone to see if we can get improvement in her dyspnea, chest congestion and wheezing which I believe is due to this process.  Plan: -prednisone 30mg  daily for one month, 20mg  daily for one month, 10mg  daily for one month -stop motrin while on prednisone -OK by me to decrease to 20mg  daily if side effects (insomnia) on 30mg  daily is too high; given her low body weight 20mg  will also likely be an effective dose  Aspiration pneumonia Today she does not have significant mucus or rhonchi to suggest that this is active.  However, I believe that this is due to the esophageal problems Dr. Rhea Belton has identified and plans to treat with botox vs myotomy.  I appreciate his care here.    Updated Medication List Outpatient Encounter Prescriptions as of 01/14/2013  Medication Sig Dispense Refill  . albuterol (PROVENTIL HFA;VENTOLIN HFA) 108 (90 BASE) MCG/ACT inhaler Inhale 2 puffs into the lungs every 6 (six)  hours as needed for wheezing.      [name] albuterol (PROVENTIL) (2.5 MG/3ML) 0.083% nebulizer solution Take 2.5 mg by nebulization every 6 (six) hours as needed for wheezing.      [name] ALPRAZolam (XANAX) 0.25 MG tablet Take 0.25 mg by mouth 2 (two) times daily as needed for sleep.      [name] Alum & Mag Hydroxide-Simeth (MAGIC MOUTHWASH) SOLN Take 5 mLs by mouth 3 (three) times daily as needed.      . Artificial Tear Ointment (ARTIFICIAL TEARS) ointment as needed.      [name] azelastine (OPTIVAR) 0.05 % ophthalmic solution Place 1 drop into both eyes 2 (two) times daily.      . budesonide-formoterol (SYMBICORT) 160-4.5 MCG/ACT inhaler Inhale 2 puffs into the lungs 2 (two) times daily.      . cetirizine (ZYRTEC) 10 MG tablet Take 10 mg by mouth daily.      . chlorpheniramine-HYDROcodone (TUSSIONEX) 10-8 MG/5ML LQCR Take 5 mLs by mouth every 12 (twelve) hours as needed.      . clopidogrel (PLAVIX) 75 MG tablet Take 75 mg by mouth daily.      . Cyanocobalamin (VITAMIN B-12 IJ) Inject as directed every 30  (thirty) days.      . fluticasone (FLONASE) 50 MCG/ACT nasal spray Place 2 sprays into the nose as needed.       . furosemide (LASIX) 20 MG tablet Take 20 mg by mouth daily.      . Lansoprazole 3 MG/ML SUSP Take 10 mLs by mouth daily.  300 mL  3  . montelukast (SINGULAIR) 10 MG tablet Take 10 mg by mouth at bedtime.      [name] PREMARIN 0.625 MG tablet TAKE 1 TABLET BY MOUTH DAILY FOR 30 DAYS  30 tablet  11  . predniSONE (DELTASONE) 10 MG tablet Take 30mg  daily for one month, 20mg  daily for one month, 10mg  daily for one month  90 tablet  2   No facility-administered encounter medications on file as of 01/14/2013.

## 2013-01-14 NOTE — Assessment & Plan Note (Signed)
" >>  ASSESSMENT AND PLAN FOR PULMONARY FIBROSIS (HCC) WRITTEN ON 01/14/2013 10:27 AM BY Jary Louvier B, MD  Anne [name] definitely has progression of an upper lobe inflammatory process since the earliest CT chest I can view from 2007.  This is clearly an inflammatory process given the multi-nucleated giant cells seen from her bronch.  DDx includes hypersensitivity pneumonitis vs sarcoid.  Hypersensitivity is less likely given no improvement with a recent ten day trip to the beach.  At this point I feel strongly that she needs a therapeutic trial of prednisone  to see if we can get improvement in her dyspnea, chest congestion and wheezing which I believe is due to this process.  Plan: -prednisone  30mg  daily for one month, 20mg  daily for one month, 10mg  daily for one month -stop motrin  while on prednisone  -OK by me to decrease to 20mg  daily if side effects (insomnia) on 30mg  daily is too high; given her low body weight 20mg  will also likely be an effective dose "

## 2013-01-14 NOTE — Assessment & Plan Note (Signed)
Anne [name] definitely has progression of an upper lobe inflammatory process since the earliest CT chest I can view from 2007.  This is clearly an inflammatory process given the multi-nucleated giant cells seen from her bronch.  DDx includes hypersensitivity pneumonitis vs sarcoid.  Hypersensitivity is less likely given no improvement with a recent ten day trip to the beach.  At this point I feel strongly that she needs a therapeutic trial of prednisone to see if we can get improvement in her dyspnea, chest congestion and wheezing which I believe is due to this process.  Plan: -prednisone 30mg  daily for one month, 20mg  daily for one month, 10mg  daily for one month -stop motrin while on prednisone -OK by me to decrease to 20mg  daily if side effects (insomnia) on 30mg  daily is too high; given her low body weight 20mg  will also likely be an effective dose

## 2013-01-14 NOTE — Assessment & Plan Note (Signed)
Today she does not have significant mucus or rhonchi to suggest that this is active.  However, I believe that this is due to the esophageal problems Dr. Rhea Belton has identified and plans to treat with botox vs myotomy.  I appreciate his care here.

## 2013-01-14 NOTE — Patient Instructions (Signed)
We will see you back in 6 weeks or sooner if needed Start taking the prednisone, minimize motrin intake on this

## 2013-01-14 NOTE — Telephone Encounter (Signed)
Pt scheduled with [place] on 01/21/13 @ 130. LDVM to inform pt.

## 2013-01-14 NOTE — Telephone Encounter (Signed)
Anne [name] this patient was calling you back to set up first time visit to [facility]. She asks if you would call her cell phone.

## 2013-01-15 ENCOUNTER — Other Ambulatory Visit: Payer: Self-pay | Admitting: Gastroenterology

## 2013-01-20 ENCOUNTER — Telehealth: Payer: Self-pay | Admitting: Gastroenterology

## 2013-01-20 NOTE — Telephone Encounter (Signed)
[facility] spoke to pharmacist to fill nexium powder to be taken with applesauce daily. Pt to be notified when Rx is ready.

## 2013-01-20 NOTE — Telephone Encounter (Signed)
Spoke to pt. Told [facility] and [facility]. They will call once that is ready. She said she bought OTC [facility] omeprazole, I told her not to take that until she speaks to her PCP who prescribed her blood thinner, because I show a contraindication for omeprazole and Plavix. She was not aware and said she will call.

## 2013-01-21 ENCOUNTER — Encounter: Payer: Self-pay | Admitting: Internal Medicine

## 2013-01-21 ENCOUNTER — Ambulatory Visit: Payer: Self-pay | Admitting: Internal Medicine

## 2013-01-23 ENCOUNTER — Telehealth: Payer: Self-pay | Admitting: Internal Medicine

## 2013-01-23 ENCOUNTER — Telehealth: Payer: Self-pay | Admitting: *Deleted

## 2013-01-23 ENCOUNTER — Telehealth: Payer: Self-pay | Admitting: Pulmonary Disease

## 2013-01-23 MED ORDER — FUROSEMIDE 20 MG PO TABS: 20.0000 mg | ORAL_TABLET | Freq: Every day | ORAL | Status: DC

## 2013-01-23 NOTE — Telephone Encounter (Signed)
These are common side effects of prednisone but this is more exaggerated than the average person at this dose.  As we have stated prior, we don't have many other good choices other than prednisone for her condition.  I would recommend that she try very hard to stick with the 20mg  daily dose for a month and then decrease to 10mg  daily.  Ideally, I would like her to continue on 20mg  daily for two months then decrease to 10mg  daily for the last month.  If she can't tolerate this, we could try solumedrol or decadron instead of prednisone, but I would prefer her to use the prednisone for another week first before we make the switch.

## 2013-01-23 NOTE — Telephone Encounter (Signed)
Message copied by Florene Glen on Thu Jan 23, 2013  4:24 PM ------      Message from: Beverley Fiedler      Created: Thu Jan 23, 2013  4:21 PM       Aram Beecham      This patient was thinking about upper endoscopy with Botox injections to the LES      She was going to see Dr. Kendrick Fries which did happen      Please contact her to see if she is interested in scheduling this upper endoscopy now or if she wants to wait.       She can call us back if she wishes, but I could do this next week during my hospital week if she wishes to proceed      WL would need to know I expect to use BOTOX      Thanks      JMP       ------

## 2013-01-23 NOTE — Telephone Encounter (Signed)
Spoke with the pt and she states she has been on prednisone x 1 week. She has went down to 20mg  daily due to side effects. She c/o having blurry vision, cramping on her legs, jittery feeling, and unable to sleep. The pt was taking the med in the morning but this increased the jitteriness so she is now taking it mid-afternoon. The pt states she is not ready to give up taking the prednisone but she wants to know is there anything that can be done about these issues. She feels like after 1 week she should be adjusting better to the medication. Pt aware BQ on night float. Please advise. Carron Curie, CMA Allergies  Allergen Reactions  . Shellfish Allergy Anaphylaxis  . Asa [Aspirin]   . Avelox [Moxifloxacin]     Increased heart rate  . Eggs Or Egg-Derived Products     GI Upset  . Influenza Vaccines   . Levaquin [Levofloxacin In D5w]

## 2013-01-23 NOTE — Telephone Encounter (Signed)
Spoke with patient, informed her that request must have been sent to Dr. Meredeth Ide office since Dr. Dan Humphreys has never prescribed it to her. But I have sent it to the pharmacy per her request. Also informed patient that she could get her B12 injections here and when she is ready please call the office to schedule a nurse visit.

## 2013-01-23 NOTE — Telephone Encounter (Signed)
States pharmacy has sent request for furosemide 20 mg, twice with no response.  Used to get this from Dr. Meredeth Ide.  Pt needs refill.  [facility] [address]     Pt gets her last B12 through Dr. Meredeth Ide today, gets monthly.  Asking if she will be able to come here beginning next month for her next B12.  Pt also asking if she has to pick up Tussionex at the office if she needs it in the future since it has codeine (not asking for this now).

## 2013-01-24 NOTE — Telephone Encounter (Signed)
LMOM x 1 

## 2013-01-24 NOTE — Telephone Encounter (Signed)
Pt aware of recs and will continue prednisone for now. Carron Curie, CMA

## 2013-01-28 NOTE — Telephone Encounter (Signed)
Message copied by Florene Glen on Tue Jan 28, 2013  8:02 AM ------      Message from: Beverley Fiedler      Created: Thu Jan 23, 2013  4:21 PM       Aram Beecham      This patient was thinking about upper endoscopy with Botox injections to the LES      She was going to see Dr. Kendrick Fries which did happen      Please contact her to see if she is interested in scheduling this upper endoscopy now or if she wants to wait.       She can call us back if she wishes, but I could do this next week during my hospital week if she wishes to proceed      WL would need to know I expect to use BOTOX      Thanks      JMP       ------

## 2013-01-31 NOTE — Telephone Encounter (Signed)
Pt called and left a message concerning insurance covering Botox. Informed pt I do see the referral, but I don't know how to check and the person responsible has left for the day. Pt stated understanding.

## 2013-02-04 ENCOUNTER — Telehealth: Payer: Self-pay | Admitting: Pulmonary Disease

## 2013-02-04 NOTE — Telephone Encounter (Signed)
Spoke with pt.  She continues to have trouble with Pred 20mg  daily.  Still having diff sleeping, leg cramps, weight loss ( under 100lb now), feels like her brain is going non stop, blurred vision.  Please advise.

## 2013-02-04 NOTE — Telephone Encounter (Signed)
She can wean to 10mg  and try to hold until she sees me next.  If she is still symptomatic after making the change then tell her to call back and we will either stop it or change to something else (solumedrol, which will likely have the same effects).  Please tell her to try the new dose for at least a week

## 2013-02-05 ENCOUNTER — Encounter: Payer: Self-pay | Admitting: Internal Medicine

## 2013-02-05 NOTE — Telephone Encounter (Signed)
i spoke with pt and is aware recs. Nothing further needed

## 2013-02-05 NOTE — Telephone Encounter (Signed)
Informed pt we have pulled up acceptable codes for Botox and Dr Rhea Belton has reviewed them and feels the procedure will be covered; Medicare can always deny though. Mailed  her a list of the codes and she will call back to r/s.

## 2013-02-25 ENCOUNTER — Encounter: Payer: Self-pay | Admitting: Pulmonary Disease

## 2013-02-25 ENCOUNTER — Ambulatory Visit (INDEPENDENT_AMBULATORY_CARE_PROVIDER_SITE_OTHER): Payer: Medicare Other | Admitting: Pulmonary Disease

## 2013-02-25 VITALS — BP 98/64 | HR 73 | Temp 97.8°F | Ht 63.5 in | Wt 103.0 lb

## 2013-02-25 DIAGNOSIS — J690 Pneumonitis due to inhalation of food and vomit: Secondary | ICD-10-CM

## 2013-02-25 DIAGNOSIS — J8410 Pulmonary fibrosis, unspecified: Secondary | ICD-10-CM

## 2013-02-25 DIAGNOSIS — J449 Chronic obstructive pulmonary disease, unspecified: Secondary | ICD-10-CM

## 2013-02-25 NOTE — Patient Instructions (Signed)
Take the prednisone 5mg  daily for a week at least.  If you can take it for a month that would be preferable. Keep taking your other medicines as written We will see you back in early February or sooner if needed

## 2013-02-25 NOTE — Assessment & Plan Note (Addendum)
" >>  ASSESSMENT AND PLAN FOR ASTHMA-COPD OVERLAP SYNDROME (HCC) WRITTEN ON 02/25/2013 10:51 AM BY Zakayla Martinec B, MD  Stable interval.  She does not wheeze when she breaths through her trach.  Her vocal cord issues clearly cause much of her wheezing.    Plan: -continue symbicort  -continue singulair    >>ASSESSMENT AND PLAN FOR PULMONARY FIBROSIS (HCC) WRITTEN ON 02/25/2013 10:49 AM BY Nissi Doffing B, MD  Despite the side effects from the prednisone , Anne [name] is breathing better and her 6 minute walk and O2 saturation numbers are actually up since starting prednisone .    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    Plan: -continue 5mg  prednisone  as long as she can handle (hopefully through January 2015) -if recurrence of dyspnea, cough then we will need to restart prednisone  with cellcept "

## 2013-02-25 NOTE — Progress Notes (Signed)
Subjective:    Patient ID: Anne [name], female    DOB: 06-03-1950, 62 y.o.   MRN: [id number]  Synopsis: She enjoys establish care with the [facility] pulmonary clinic in may of 2014. 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 A CT scan in 2013 performed at [facility] showed some groundglass changes, interstitial thickening, and interlobular thickening in the upper lobes predominantly.  HPI   08/13/2012 ROV >> Anne [name] has had chest congestion, cough, and increasing shortness of breath since Saturday. She has been taking Mucinex for this but it does not help. This is been associated with muscle aches, and fatigue. She has multiple joint aches, and states that her ring finger PIP has been swollen for the last several weeks. She also notes a new rash on her face which is red. She came to our clinic today as a sick visit but also would like to followup the results of the CT scan which was performed last week.  09/03/2012 ROV >> Anne [name] has been doing well since our last visit. After taking the antibiotic her cough went away. She still occasionally has a dry cough with singing and while eating meals. She has noted a significant amount of aspiration in the last several months. She has had chronic difficulty with swallowing. She often feels things like pills in certain foods getting stuck in her chest. She continues to get B12 shots and has done this for years. Her weight has been stable lately. She is continuing to use her medications as documented below. She has not been bleeding from anywhere as far she can tell.  11/05/2012  ROV >> Anne [name] has been having increasing cough, chest congestion and dyspnea for the last week. She's had chills but no fever. She states that some food still gets stuck when she swallows. She has had some sinus congestion but not much postnasal drip. She has not had a significant productive cough. She feels some soreness in the right upper chest/back from coughing.  12/10/2012 ROV >> Since the bronch Anne [name] completed the Cipro > ten days total (finished on Friday of last week) > she said that it helped some.  She feels like she has congetsion between nose and chest. She has been feeling stopped up in head.  She has had to use inhaler some and even had to open her trach a couple of times.  She said that using her inhaler helped some.  She has noted some yellow color to sputum. She has noted chills, but no fever.  01/14/2013 ROV >> Anne [name] still hasn't started the prednisone yet because she doesn't like the way it affects her.  She has had a flu shot since the last visit.  She has been seeing Dr. Hilarie Fredrickson and has plans for an endoscopy.  She says that when she was at the beach for ten days she continued to have breathing problems.  These included wheezing, feeling chest congestion and the feeling that she couldn't get a deep breath.  She has been having a lot of joint (knee, joint, shoulder pain) lately.  She had throat swelling after her flu shot this time  that lasted for a few minutes after.    02/25/2013 ROV > Anne [name] started taking prednisone at 30mg  in October and has tapered down to 10mg  daily.  She is having a lot of sinus problems this time of year which is really helping with her voice and overall symptoms.  She is having a little cough and dyspnea. Dyspnea is better since before.  Overall, she is really frustrated by the side effects of the prednisone, primarily sleep loss and flushing in her face.  She is eating yogurt and soft conssitency foods. No meat intake lately.  She thinks she may have aspirated a  couple of times since the last visit.     Past Medical History  Diagnosis Date  . Asthma   . COPD (chronic obstructive pulmonary disease)   . Stroke     slurred speech, drawn face, imaging normal, occurred twice, [facility]  . Migraine   . Allergic rhinitis   . Anemia   . IBS (irritable bowel syndrome)   . Shingles      Review of Systems  Constitutional: Positive for fatigue. Negative for fever and chills.  HENT: Negative for congestion, postnasal drip, rhinorrhea and sinus pressure.   Respiratory: Positive for cough and shortness of breath. Negative for chest tightness.   Cardiovascular: Negative for chest pain, palpitations and leg swelling.       Objective:   Physical Exam  Filed Vitals:   02/25/13 0942  BP: 98/64  Pulse: 73  Temp: 97.8 F (36.6 C)  TempSrc: Oral  Height: 5' 3.5" (1.613 m)  Weight: 103 lb (46.72 kg)  SpO2: 100%   Gen:  Well appearing HEENT: NCAT, EOMi, OP clear,  PULM: upper airway wheezing; no rhonchi or crackles, good air movement  CV: RRR, no mgr, no JVD AB: BS+, soft, nontender, no hsm Ext: warm, no edema, no clubbing, no cyanosis   [facility] emphysema bilaterally; upper lobe groundglass opacification, interstitial thickening, interlobular thickening, and apical cappingnoted bilaterally. This is significantly worsened since the 2007 comparison study. 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    Plan: -continue 5mg  prednisone as long as she can handle (hopefully through January 2015) -if recurrence of dyspnea, cough then we will need to restart prednisone with cellcept  COPD with asthma Stable interval.  She does not wheeze when she breaths through her trach.  Her vocal cord issues clearly cause much of her wheezing.    Plan: -continue symbicort -continue singulair  Aspiration pneumonia She has not had evidence of this since the last visit. The plan at this point is to  schedule an endoscopy for possible Botox injections with Dr. Rhea Belton.  Plan: -continue f/u with GI as scheduled.    Updated Medication List Outpatient Encounter Prescriptions as of 02/25/2013  Medication Sig  . albuterol (PROVENTIL HFA;VENTOLIN HFA) 108 (90 BASE) MCG/ACT inhaler Inhale 2 puffs into the lungs every 6 (six) hours as needed for wheezing.  [name] albuterol (PROVENTIL) (2.5 MG/3ML) 0.083% nebulizer solution Take 2.5 mg by nebulization every 6 (six) hours as needed for wheezing.  [name] ALPRAZolam (XANAX) 0.25 MG tablet Take 0.25 mg by mouth 2 (two) times daily as needed for sleep.  [name] Alum & Mag Hydroxide-Simeth (MAGIC MOUTHWASH) SOLN Take 5 mLs by mouth 3 (three) times daily as needed.  . Artificial Tear Ointment (ARTIFICIAL TEARS) ointment as needed.  [name] azelastine (OPTIVAR) 0.05 % ophthalmic solution Place 1 drop into both eyes 2 (two) times daily.  . budesonide-formoterol (SYMBICORT) 160-4.5 MCG/ACT inhaler Inhale 2 puffs into the lungs 2 (two) times daily.  . cetirizine (ZYRTEC) 10 MG tablet Take 10 mg by mouth daily.  .  chlorpheniramine-HYDROcodone (TUSSIONEX) 10-8 MG/5ML LQCR Take 5 mLs by mouth every 12 (twelve) hours as needed.  . clopidogrel (PLAVIX) 75 MG tablet Take 75 mg by mouth daily.  . Cyanocobalamin (VITAMIN B-12 IJ) Inject as directed every 30 (thirty) days.  . fluticasone (FLONASE) 50 MCG/ACT nasal spray Place 2 sprays into the nose as needed.   . furosemide (LASIX) 20 MG tablet Take 1 tablet (20 mg total) by mouth daily.  . montelukast (SINGULAIR) 10 MG tablet Take 10 mg by mouth at bedtime.  [name] PREMARIN 0.625 MG tablet TAKE 1 TABLET BY MOUTH DAILY FOR 30 DAYS  . sodium chloride (OCEAN) 0.65 % nasal spray Place 2 sprays into the nose 2 (two) times daily as needed for congestion.

## 2013-02-25 NOTE — Assessment & Plan Note (Signed)
She has not had evidence of this since the last visit. The plan at this point is to schedule an endoscopy for possible Botox injections with Dr. Rhea Belton.  Plan: -continue f/u with GI as scheduled.

## 2013-02-25 NOTE — Assessment & Plan Note (Signed)
Despite the side effects from the prednisone, Anne [name] is breathing better and her 6 minute walk and O2 saturation numbers are actually up since starting prednisone.    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    Plan: -continue 5mg  prednisone as long as she can handle (hopefully through January 2015) -if recurrence of dyspnea, cough then we will need to restart prednisone with cellcept

## 2013-02-26 ENCOUNTER — Telehealth: Payer: Self-pay | Admitting: Internal Medicine

## 2013-02-27 NOTE — Telephone Encounter (Signed)
lmom for pt to call back

## 2013-02-27 NOTE — Telephone Encounter (Signed)
We had given pt info so she could call the insurance company about her BOTOX. She reports today, that they stated her provider would need to call for the information.

## 2013-03-03 ENCOUNTER — Telehealth: Payer: Self-pay | Admitting: Internal Medicine

## 2013-03-03 NOTE — Telephone Encounter (Signed)
Dr Ulyses Jarred notes indicate that she would likely need to go on increased pred + cellcept if she could not tolerate taper. Unclear to me whether this is sarcoidosis vs UA syndrome, etc - unable to determine over the phone. She should increase her pred back to 10mg  daily for now, defer to Dr Kendrick Fries regarding the final rx dose pred and when to start CellCept. I will forward this not to him as well as back to Triage

## 2013-03-03 NOTE — Telephone Encounter (Signed)
lmomtcb x1 

## 2013-03-03 NOTE — Telephone Encounter (Signed)
I spoke with pt. She reports for the past couple days--has been having sore throat, PND, some coughing but rarely brings up mucus, chest congestion. Pt is taking pred 5 mg. Since doing this is when all the symptoms started. She is also having increase body aches. She is wanting to increase her prednisone back up. Please advise Dr. Kendrick Fries  Allergies  Allergen Reactions  . Shellfish Allergy Anaphylaxis  . Asa [Aspirin]   . Avelox [Moxifloxacin]     Increased heart rate  . Eggs Or Egg-Derived Products     GI Upset  . Influenza Vaccines   . Levaquin [Levofloxacin In D5w]

## 2013-03-03 NOTE — Telephone Encounter (Signed)
Called spoke with patient, advised of RB's recs as stated below.  Pt okay with increasing her prednisone back up to 10mg  and await BQ's final recommendations.  Pt aware to call the office or seek emergency assistance if her breathing worsens.  Will forward to BQ.

## 2013-03-03 NOTE — Telephone Encounter (Signed)
Pt is returning call.  Anne [name] ° °

## 2013-03-04 NOTE — Telephone Encounter (Signed)
i called and spoke with pt. She is aware of recs. Nothing further needed 

## 2013-03-04 NOTE — Telephone Encounter (Signed)
Have her continue this dose until she sees me next If worsening cough, sputum production or fever let us know so we can call in an antibiotic first.

## 2013-03-05 ENCOUNTER — Telehealth: Payer: Self-pay | Admitting: Pulmonary Disease

## 2013-03-05 MED ORDER — DOXYCYCLINE HYCLATE 100 MG PO CAPS: 100.0000 mg | ORAL_CAPSULE | Freq: Two times a day (BID) | ORAL | Status: DC

## 2013-03-05 NOTE — Telephone Encounter (Signed)
Called, spoke with pt.  Informed her of below recs per BQ.  She verbalized understanding, is [facility], and is to call back if not getting better or symptoms get worse.  Pt states she doesn't need a pred rx sent to pharm.  Reports she has plenty of this for now.

## 2013-03-05 NOTE — Telephone Encounter (Signed)
Spoke with patient-states she has been having increased congestion that is moving to her chest; increased SOB and wheezing with activity, feels clammy but no fevers. Pt increased her Prednisone to 10 mg QD and has helped slightly with her breathing. Pt would like recommendations of what to do or what she should take. BQ please advise. Thanks.

## 2013-03-05 NOTE — Telephone Encounter (Signed)
Please have her take 20mg  prednisone for 3 days then back to 10mg  Also, doxycycline 100mg  po bid for 7 days See me next week if no better

## 2013-03-05 NOTE — Telephone Encounter (Signed)
LMTCB

## 2013-03-11 ENCOUNTER — Telehealth: Payer: Self-pay | Admitting: Pulmonary Disease

## 2013-03-11 NOTE — Telephone Encounter (Signed)
Pt states Dr. Kendrick Fries asked she call to give an update. She states she is taking prednisone 10mg  but she is still having a lot of chest congestion, and cough at night. Pt states she is also having a lot of issue with cough and choking after she eats lately. Pt wants to know if there is anything that she can take to help dry up this congestion? Any other rec? Pt states she has an appt Dr. Dan Humphreys on Friday. Please advise. Carron Curie, CMA Allergies  Allergen Reactions  . Shellfish Allergy Anaphylaxis  . Asa [Aspirin]   . Avelox [Moxifloxacin]     Increased heart rate  . Eggs Or Egg-Derived Products     GI Upset  . Influenza Vaccines   . Levaquin [Levofloxacin In D5w]

## 2013-03-11 NOTE — Telephone Encounter (Signed)
lmom for pt to call back

## 2013-03-12 MED ORDER — METHYLPREDNISOLONE 8 MG PO TABS: ORAL_TABLET | ORAL | Status: DC

## 2013-03-12 NOTE — Telephone Encounter (Signed)
Returning call can be reached [phone].Anne [name]

## 2013-03-12 NOTE — Telephone Encounter (Signed)
Try mucinex for chest congestion Stop prednisone Start Solumedrol 20mg  daily for two weeks, then down to ten mg until she sees me, please send Rx

## 2013-03-12 NOTE — Telephone Encounter (Signed)
Pt is aware of new directions. Nothing further needed

## 2013-03-12 NOTE — Telephone Encounter (Signed)
lmomtcb x1 for pt 

## 2013-03-12 NOTE — Telephone Encounter (Signed)
Spoke with pt about information I received from [employer] who manages her medicare/[employer] account. Informed her we looked on the [email or web address] website and BOTOX is listed which the rep felt was a good sign for coverage. Informed pt Dr Lauro Franklin next hospital rotation is the 1st week of February. Pt will consider the price and call.

## 2013-03-12 NOTE — Telephone Encounter (Signed)
24mg  for two weeks, then down to 16mg  until she sees me

## 2013-03-12 NOTE — Telephone Encounter (Signed)
Returning call.Anne [name] ° °

## 2013-03-12 NOTE — Telephone Encounter (Signed)
lmomtcb x1 

## 2013-03-12 NOTE — Telephone Encounter (Signed)
Pt is aware of recs. The pharmacy reports solumedrol is only available via injection. If we want PO for medrol (per pharmacy this is the same) it only come sin in 2 mg, 4 mg, 8mg , 16 mg, 32 mg. Please advise Dr. Kendrick Fries thanks  Allergies  Allergen Reactions  . Shellfish Allergy Anaphylaxis  . Asa [Aspirin]   . Avelox [Moxifloxacin]     Increased heart rate  . Eggs Or Egg-Derived Products     GI Upset  . Influenza Vaccines   . Levaquin [Levofloxacin In D5w]

## 2013-03-14 ENCOUNTER — Ambulatory Visit (INDEPENDENT_AMBULATORY_CARE_PROVIDER_SITE_OTHER): Payer: Medicare Other | Admitting: Internal Medicine

## 2013-03-14 ENCOUNTER — Ambulatory Visit (INDEPENDENT_AMBULATORY_CARE_PROVIDER_SITE_OTHER)
Admission: RE | Admit: 2013-03-14 | Discharge: 2013-03-14 | Disposition: A | Discharge status: Home / Self Care | Payer: Medicare Other | Source: Ambulatory Visit | Attending: Internal Medicine | Admitting: Internal Medicine

## 2013-03-14 ENCOUNTER — Encounter: Payer: Self-pay | Admitting: Internal Medicine

## 2013-03-14 VITALS — BP 100/60 | HR 66 | Temp 97.6°F | Wt 104.0 lb

## 2013-03-14 DIAGNOSIS — K121 Other forms of stomatitis: Secondary | ICD-10-CM

## 2013-03-14 DIAGNOSIS — J209 Acute bronchitis, unspecified: Secondary | ICD-10-CM

## 2013-03-14 DIAGNOSIS — J449 Chronic obstructive pulmonary disease, unspecified: Secondary | ICD-10-CM

## 2013-03-14 DIAGNOSIS — K1370 Unspecified lesions of oral mucosa: Secondary | ICD-10-CM

## 2013-03-14 MED ORDER — AMOXICILLIN-POT CLAVULANATE 400-57 MG PO CHEW: 1.0000 | CHEWABLE_TABLET | Freq: Three times a day (TID) | ORAL | Status: DC

## 2013-03-14 NOTE — Progress Notes (Signed)
Pre-visit discussion using our clinic review tool. No additional management support is needed unless otherwise documented below in the visit note.  

## 2013-03-15 DIAGNOSIS — J209 Acute bronchitis, unspecified: Secondary | ICD-10-CM | POA: Insufficient documentation

## 2013-03-15 DIAGNOSIS — K121 Other forms of stomatitis: Secondary | ICD-10-CM | POA: Insufficient documentation

## 2013-03-15 NOTE — Assessment & Plan Note (Signed)
Symptoms are consistent with acute bronchitis. Will continue Medrol dose pack. Will add Augmentin. Patient will call or return to clinic if symptoms are not improving over the next 48 hours. Continue inhaled albuterol as needed. Tussionex as needed for cough. Followup in 2 weeks for recheck or sooner as needed.

## 2013-03-15 NOTE — Assessment & Plan Note (Signed)
Appearance is not consistent with candidiasis. Suspect aphthous ulcers, possibly related to recent viral infection. Culture sent today. If no improvement, favor ENT evaluation for possible biopsy.

## 2013-03-15 NOTE — Progress Notes (Signed)
Subjective:    Patient ID: Anne [name], female    DOB: January 02, 1951, 62 y.o.   MRN: [id number]  HPI 62 year old female with history of COPD, pulmonary fibrosis presents for followup. Over the last few days, she has noted increased shortness of breath and thick sputum production. She denies any fever or chills. She denies chest pain. She has started a Medrol Dosepak at the direction of her pulmonologist. She has not been on antibiotics. She has been using her inhalers as directed.  She is very anxious today, concerned about her husband. She reports he was recently diagnosed with cellulitis of his right legs and spent time in the emergency room being treated for this.  She is also concerned about intermittent ulceration in her anterior lower mouth. In the past, she was told this might be related to yeast infection. However, she has had no improvement with use of nystatin mouthwash or Diflucan. The ulcers are described as painful. They tend to come and go. This is been going on for several months.  Outpatient Prescriptions Prior to Visit  Medication Sig Dispense Refill  . albuterol (PROVENTIL HFA;VENTOLIN HFA) 108 (90 BASE) MCG/ACT inhaler Inhale 2 puffs into the lungs every 6 (six) hours as needed for wheezing.      [name] albuterol (PROVENTIL) (2.5 MG/3ML) 0.083% nebulizer solution Take 2.5 mg by nebulization every 6 (six) hours as needed for wheezing.      [name] ALPRAZolam (XANAX) 0.25 MG tablet Take 0.25 mg by mouth 2 (two) times daily as needed for sleep.      [name] Alum & Mag Hydroxide-Simeth (MAGIC MOUTHWASH) SOLN Take 5 mLs by mouth 3 (three) times daily as needed.      . Artificial Tear Ointment (ARTIFICIAL TEARS) ointment as needed.      [name] azelastine (OPTIVAR) 0.05 % ophthalmic solution Place 1 drop into both eyes 2 (two) times daily.      . cetirizine (ZYRTEC) 10 MG tablet Take 10 mg by mouth daily.      . chlorpheniramine-HYDROcodone (TUSSIONEX) 10-8 MG/5ML LQCR Take 5 mLs by mouth every 12  (twelve) hours as needed.      . clopidogrel (PLAVIX) 75 MG tablet Take 75 mg by mouth daily.      . Cyanocobalamin (VITAMIN B-12 IJ) Inject as directed every 30 (thirty) days.      . fluticasone (FLONASE) 50 MCG/ACT nasal spray Place 2 sprays into the nose as needed.       . furosemide (LASIX) 20 MG tablet Take 1 tablet (20 mg total) by mouth daily.  30 tablet  4  . methylPREDNISolone (MEDROL) 8 MG tablet Take 24 mg daily x 2 weeks then drop to 16 mg until next office visit  50 tablet  1  . montelukast (SINGULAIR) 10 MG tablet Take 10 mg by mouth at bedtime.      [name] PREMARIN 0.625 MG tablet TAKE 1 TABLET BY MOUTH DAILY FOR 30 DAYS  30 tablet  11  . sodium chloride (OCEAN) 0.65 % nasal spray Place 2 sprays into the nose 2 (two) times daily as needed for congestion.      . budesonide-formoterol (SYMBICORT) 160-4.5 MCG/ACT inhaler Inhale 2 puffs into the lungs 2 (two) times daily.      [name] doxycycline (VIBRAMYCIN) 100 MG capsule Take 1 capsule (100 mg total) by mouth 2 (two) times daily.  14 capsule  0   No facility-administered medications prior to visit.   BP 100/60  Pulse 66  Temp(Src)  97.6 F (36.4 C) (Oral)  Wt 104 lb (47.174 kg)  SpO2 97%  Review of Systems  Constitutional: Positive for fatigue. Negative for fever, chills and unexpected weight change.  HENT: Negative for congestion, ear discharge, ear pain, facial swelling, hearing loss, mouth sores, nosebleeds, postnasal drip, rhinorrhea, sinus pressure, sneezing, sore throat, tinnitus, trouble swallowing and voice change.   Eyes: Negative for pain, discharge, redness and visual disturbance.  Respiratory: Positive for cough and shortness of breath. Negative for chest tightness, wheezing and stridor.   Cardiovascular: Negative for chest pain, palpitations and leg swelling.  Musculoskeletal: Negative for arthralgias, myalgias, neck pain and neck stiffness.  Skin: Negative for color change and rash.  Neurological: Negative for dizziness,  weakness, light-headedness and headaches.  Hematological: Negative for adenopathy.       Objective:   Physical Exam  Constitutional: She is oriented to person, place, and time. She appears well-developed and well-nourished. No distress.  HENT:  Head: Normocephalic and atraumatic.  Right Ear: External ear normal.  Left Ear: External ear normal.  Nose: Nose normal.  Mouth/Throat: Oropharynx is clear and moist. Oral lesions (multiple areas of ulceration noted anterior lower mucosa) present. No oropharyngeal exudate.  Eyes: Conjunctivae are normal. Pupils are equal, round, and reactive to light. Right eye exhibits no discharge. Left eye exhibits no discharge. No scleral icterus.  Neck: Normal range of motion. Neck supple. No tracheal deviation present. No thyromegaly present.  Cardiovascular: Normal rate, regular rhythm, normal heart sounds and intact distal pulses.  Exam reveals no gallop and no friction rub.   No murmur heard. Pulmonary/Chest: Effort normal. No accessory muscle usage. Not tachypneic. No respiratory distress. She has decreased breath sounds. She has no wheezes. She has rhonchi. She has no rales. She exhibits no tenderness.  Musculoskeletal: Normal range of motion. She exhibits no edema and no tenderness.  Lymphadenopathy:    She has no cervical adenopathy.  Neurological: She is alert and oriented to person, place, and time. No cranial nerve deficit. She exhibits normal muscle tone. Coordination normal.  Skin: Skin is warm and dry. No rash noted. She is not diaphoretic. No erythema. No pallor.  Psychiatric: She has a normal mood and affect. Her behavior is normal. Judgment and thought content normal.          Assessment & Plan:

## 2013-03-15 NOTE — Assessment & Plan Note (Signed)
Recent worsening of symptoms of dyspnea, productive cough, wheezing. Medrol taper per Dr. Kendrick Fries. Will start Augmentin. Follow up in 1-2 weeks for recheck, sooner if no improvement.

## 2013-03-16 LAB — CULTURE, GROUP A STREP: Organism ID, Bacteria: NORMAL

## 2013-03-17 ENCOUNTER — Telehealth: Payer: Self-pay | Admitting: Internal Medicine

## 2013-03-17 NOTE — Telephone Encounter (Signed)
Patient Information:  Caller Name: Anne [name]  Phone: [phone]  Patient: , Anne [name]  Gender: Female  DOB: Aug 13, 1950  Age: 62 Years  PCP: Ronna Polio (Adults only)  Office Follow Up:  Does the office need to follow up with this patient?: Yes  Instructions For The Office: She has not improved since her 03/14/2013 visit , chest congestion still lingering, tired, aches and coughing.  RN Note:  Afebrile. Onset 03/05/2013 of Cough and had OV with Dr. Kendrick Fries. OV then with Dr. Dan Humphreys, 03/14/2013. Seleste started on medications, as ordered but has seen little improvement.  She has aches, chills, tired, coughing and trach is "raw" feeling but skin intact. She is putting Desetin around it and states it is dry. She denies respiratory distress but states he chest congestion that is very tiring. She refused Urgent Care suggestion by RN/CAN and will call in the am, 03/18/2013 for appointment to follow up. She has not tried the nebulizer treatments yet due to the trach being sore, but will try now. She states she is in no outward distress.  Symptoms  Reason For Call & Symptoms: OV follow up with Dr. Dan Humphreys, Friday, 03/14/2013 and started on antibiotic and dx. with bronchitis. She has a trach.  Reviewed Health History In EMR: Yes  Reviewed Medications In EMR: Yes  Reviewed Allergies In EMR: Yes  Reviewed Surgeries / Procedures: Yes  Date of Onset of Symptoms: 03/05/2013  Treatments Tried: Agumentin, inhalers  Treatments Tried Worked: No  Guideline(s) Used:  Cough  Breathing Difficulty  Disposition Per Guideline:   Go to Office Now  Reason For Disposition Reached:   Mild difficulty breathing (e.g., minimal/no SOB at rest, SOB with walking, pulse < 100) of new onset or worse than normal  Advice Given:  Call Back If:  Severe difficulty breathing occurs  Fever more than 100.5 F (38.1 C)  You become worse.  RN Overrode Recommendation:  Follow Up With Office Later  She will call in the am,  03/18/2013 for an appointment.

## 2013-03-18 ENCOUNTER — Encounter: Payer: Self-pay | Admitting: Internal Medicine

## 2013-03-18 ENCOUNTER — Ambulatory Visit (INDEPENDENT_AMBULATORY_CARE_PROVIDER_SITE_OTHER): Payer: Medicare Other | Admitting: Internal Medicine

## 2013-03-18 VITALS — BP 110/70 | HR 75 | Temp 98.0°F | Wt 105.0 lb

## 2013-03-18 DIAGNOSIS — J449 Chronic obstructive pulmonary disease, unspecified: Secondary | ICD-10-CM

## 2013-03-18 DIAGNOSIS — J209 Acute bronchitis, unspecified: Secondary | ICD-10-CM

## 2013-03-18 DIAGNOSIS — J8410 Pulmonary fibrosis, unspecified: Secondary | ICD-10-CM

## 2013-03-18 MED ORDER — HYDROCOD POLST-CHLORPHEN POLST 10-8 MG/5ML PO LQCR: 5.0000 mL | Freq: Two times a day (BID) | ORAL | Status: DC | PRN

## 2013-03-18 MED ORDER — AZITHROMYCIN 200 MG/5ML PO SUSR: ORAL | Status: DC

## 2013-03-18 NOTE — Telephone Encounter (Signed)
Appt scheduled for today at 11:00. Pt states continues to have congestion, continues on antibiotic. Would like to have her and trach rechecked. States mouth sores have improved, but have a couple new places that have broken out.

## 2013-03-18 NOTE — Progress Notes (Signed)
Subjective:    Patient ID: Anne [name], female    DOB: June 06, 1950, 62 y.o.   MRN: [id number]  HPI 62 year old female with history of pulmonary fibrosis, COPD, tracheostomy presents for acute visit. She was seen in clinic on December 19 complaining of increased congestion and cough. She had argued been started on a Medrol Dosepak. Augmentin was added. She reports over the last couple of days her symptoms have been persistent with shortness of breath, cough, and nasal congestion. She also has some discomfort at her trach site. She denies any fever, but has had some chills.  Outpatient Prescriptions Prior to Visit  Medication Sig Dispense Refill  . albuterol (PROVENTIL HFA;VENTOLIN HFA) 108 (90 BASE) MCG/ACT inhaler Inhale 2 puffs into the lungs every 6 (six) hours as needed for wheezing.      [name] albuterol (PROVENTIL) (2.5 MG/3ML) 0.083% nebulizer solution Take 2.5 mg by nebulization every 6 (six) hours as needed for wheezing.      [name] ALPRAZolam (XANAX) 0.25 MG tablet Take 0.25 mg by mouth 2 (two) times daily as needed for sleep.      [name] Alum & Mag Hydroxide-Simeth (MAGIC MOUTHWASH) SOLN Take 5 mLs by mouth 3 (three) times daily as needed.      [name] amoxicillin-clavulanate (AUGMENTIN) 400-57 MG per chewable tablet Chew 1 tablet by mouth 3 (three) times daily.  21 tablet  0  . Artificial Tear Ointment (ARTIFICIAL TEARS) ointment as needed.      [name] azelastine (OPTIVAR) 0.05 % ophthalmic solution Place 1 drop into both eyes 2 (two) times daily.      . cetirizine (ZYRTEC) 10 MG tablet Take 10 mg by mouth daily.      . clopidogrel (PLAVIX) 75 MG tablet Take 75 mg by mouth daily.      . Cyanocobalamin (VITAMIN B-12 IJ) Inject as directed every 30 (thirty) days.      . fluticasone (FLONASE) 50 MCG/ACT nasal spray Place 2 sprays into the nose as needed.       . furosemide (LASIX) 20 MG tablet Take 1 tablet (20 mg total) by mouth daily.  30 tablet  4  . methylPREDNISolone (MEDROL) 8 MG tablet Take 24 mg  daily x 2 weeks then drop to 16 mg until next office visit  50 tablet  1  . montelukast (SINGULAIR) 10 MG tablet Take 10 mg by mouth at bedtime.      [name] PREMARIN 0.625 MG tablet TAKE 1 TABLET BY MOUTH DAILY FOR 30 DAYS  30 tablet  11  . sodium chloride (OCEAN) 0.65 % nasal spray Place 2 sprays into the nose 2 (two) times daily as needed for congestion.      . chlorpheniramine-HYDROcodone (TUSSIONEX) 10-8 MG/5ML LQCR Take 5 mLs by mouth every 12 (twelve) hours as needed.       No facility-administered medications prior to visit.    Review of Systems  Constitutional: Positive for fatigue. Negative for fever, chills and unexpected weight change.  HENT: Positive for congestion, postnasal drip and rhinorrhea. Negative for ear discharge, ear pain, facial swelling, hearing loss, mouth sores, nosebleeds, sinus pressure, sneezing, sore throat, tinnitus, trouble swallowing and voice change.   Eyes: Negative for pain, discharge, redness and visual disturbance.  Respiratory: Positive for cough, shortness of breath and wheezing. Negative for chest tightness and stridor.   Cardiovascular: Negative for chest pain, palpitations and leg swelling.  Musculoskeletal: Negative for arthralgias, myalgias, neck pain and neck stiffness.  Skin: Negative for color change and  rash.  Neurological: Negative for dizziness, weakness, light-headedness and headaches.  Hematological: Negative for adenopathy.       Objective:   Physical Exam  Constitutional: She is oriented to person, place, and time. She appears well-developed and well-nourished. No distress.  HENT:  Head: Normocephalic and atraumatic.  Right Ear: External ear normal.  Left Ear: External ear normal.  Nose: Nose normal.  Mouth/Throat: Oropharynx is clear and moist. No oropharyngeal exudate.  Eyes: Conjunctivae are normal. Pupils are equal, round, and reactive to light. Right eye exhibits no discharge. Left eye exhibits no discharge. No scleral icterus.    Neck: Normal range of motion. Neck supple. No tracheal deviation present. No thyromegaly present.  Cardiovascular: Normal rate, regular rhythm, normal heart sounds and intact distal pulses.  Exam reveals no gallop and no friction rub.   No murmur heard. Pulmonary/Chest: Effort normal. No accessory muscle usage. Not tachypneic. No respiratory distress. She has decreased breath sounds (very poor air movement with occasional wheeze). She has no wheezes. She has rhonchi (scattered). She has no rales. She exhibits no tenderness.  Musculoskeletal: Normal range of motion. She exhibits no edema and no tenderness.  Lymphadenopathy:    She has no cervical adenopathy.  Neurological: She is alert and oriented to person, place, and time. No cranial nerve deficit. She exhibits normal muscle tone. Coordination normal.  Skin: Skin is warm and dry. No rash noted. She is not diaphoretic. No erythema. No pallor.  Psychiatric: She has a normal mood and affect. Her behavior is normal. Judgment and thought content normal.          Assessment & Plan:

## 2013-03-18 NOTE — Assessment & Plan Note (Signed)
Worsening symptoms of bronchitis with very poor air movement noted on exam today. She continues on Medrol Dosepak, Augmentin. Will add azithromycin. Encouraged her to use her albuterol inhaler every 4 hours. If symptoms are not improving over the next 24 hours, we discussed that she may need hospitalization for IV steroids. If any worsening shortness of breath or cough she will call or RTC.

## 2013-03-18 NOTE — Progress Notes (Signed)
Pre-visit discussion using our clinic review tool. No additional management support is needed unless otherwise documented below in the visit note.  

## 2013-03-31 ENCOUNTER — Ambulatory Visit (INDEPENDENT_AMBULATORY_CARE_PROVIDER_SITE_OTHER): Payer: Medicare Other | Admitting: Internal Medicine

## 2013-03-31 ENCOUNTER — Encounter: Payer: Self-pay | Admitting: Internal Medicine

## 2013-03-31 VITALS — BP 112/60 | HR 77 | Temp 97.6°F | Wt 102.0 lb

## 2013-03-31 DIAGNOSIS — J8410 Pulmonary fibrosis, unspecified: Secondary | ICD-10-CM

## 2013-03-31 DIAGNOSIS — E538 Deficiency of other specified B group vitamins: Secondary | ICD-10-CM

## 2013-03-31 DIAGNOSIS — J209 Acute bronchitis, unspecified: Secondary | ICD-10-CM

## 2013-03-31 LAB — COMPREHENSIVE METABOLIC PANEL
ALT: 18 U/L (ref 0–35)
AST: 22 U/L (ref 0–37)
Albumin: 4.1 g/dL (ref 3.5–5.2)
Alkaline Phosphatase: 53 U/L (ref 39–117)
BUN: 15 mg/dL (ref 6–23)
CO2: 32 mEq/L (ref 19–32)
Calcium: 9.6 mg/dL (ref 8.4–10.5)
Chloride: 97 mEq/L (ref 96–112)
Creatinine, Ser: 0.9 mg/dL (ref 0.4–1.2)
GFR: 66.24 mL/min (ref >60.00)
Glucose, Bld: 79 mg/dL (ref 70–99)
Potassium: 3.1 mEq/L — ABNORMAL LOW (ref 3.5–5.1)
Sodium: 140 mEq/L (ref 135–145)
Total Bilirubin: 0.8 mg/dL (ref 0.3–1.2)
Total Protein: 7.1 g/dL (ref 6.0–8.3)

## 2013-03-31 MED ORDER — CYANOCOBALAMIN 1000 MCG/ML IJ SOLN
1000.0000 ug | Freq: Once | INTRAMUSCULAR | Status: AC
  Administered 2013-03-31: 1000 ug via INTRAMUSCULAR

## 2013-03-31 NOTE — Progress Notes (Signed)
Subjective:    Patient ID: Anne [name], female    DOB: 1950/09/19, 63 y.o.   MRN: [id number]  HPI 63YO female with pulmonary fibrosis, COPD presents to follow up recent bronchitis. She reports symptoms improved with use of Augmentin, Azithromycin and Medrol. She is feeling well. Area around trach no longer tender. No new concerns today.  Outpatient Encounter Prescriptions as of 03/31/2013  Medication Sig  . albuterol (PROVENTIL HFA;VENTOLIN HFA) 108 (90 BASE) MCG/ACT inhaler Inhale 2 puffs into the lungs every 6 (six) hours as needed for wheezing.  [name] albuterol (PROVENTIL) (2.5 MG/3ML) 0.083% nebulizer solution Take 2.5 mg by nebulization every 6 (six) hours as needed for wheezing.  [name] ALPRAZolam (XANAX) 0.25 MG tablet Take 0.25 mg by mouth 2 (two) times daily as needed for sleep.  [name] Alum & Mag Hydroxide-Simeth (MAGIC MOUTHWASH) SOLN Take 5 mLs by mouth 3 (three) times daily as needed.  . Artificial Tear Ointment (ARTIFICIAL TEARS) ointment as needed.  [name] azelastine (OPTIVAR) 0.05 % ophthalmic solution Place 1 drop into both eyes 2 (two) times daily.  . cetirizine (ZYRTEC) 10 MG tablet Take 10 mg by mouth daily.  . chlorpheniramine-HYDROcodone (TUSSIONEX) 10-8 MG/5ML LQCR Take 5 mLs by mouth every 12 (twelve) hours as needed.  . clopidogrel (PLAVIX) 75 MG tablet Take 75 mg by mouth daily.  . Cyanocobalamin (VITAMIN B-12 IJ) Inject as directed every 30 (thirty) days.  . fluticasone (FLONASE) 50 MCG/ACT nasal spray Place 2 sprays into the nose as needed.   . furosemide (LASIX) 20 MG tablet Take 1 tablet (20 mg total) by mouth daily.  . montelukast (SINGULAIR) 10 MG tablet Take 10 mg by mouth at bedtime.  . predniSONE (DELTASONE) 10 MG tablet Take 10 mg by mouth. Taking 25-30 mg daily  . PREMARIN 0.625 MG tablet TAKE 1 TABLET BY MOUTH DAILY FOR 30 DAYS  . sodium chloride (OCEAN) 0.65 % nasal spray Place 2 sprays into the nose 2 (two) times daily as needed for congestion.   BP 112/60  Pulse  77  Temp(Src) 97.6 F (36.4 C) (Oral)  Wt 102 lb (46.267 kg)  SpO2 97%    Review of Systems  Constitutional: Negative for fever, chills and unexpected weight change.  HENT: Negative for congestion, ear discharge, ear pain, facial swelling, hearing loss, mouth sores, nosebleeds, postnasal drip, rhinorrhea, sinus pressure, sneezing, sore throat, tinnitus, trouble swallowing and voice change.   Eyes: Negative for pain, discharge, redness and visual disturbance.  Respiratory: Negative for cough, chest tightness, shortness of breath, wheezing and stridor.   Cardiovascular: Negative for chest pain, palpitations and leg swelling.  Musculoskeletal: Negative for arthralgias, myalgias, neck pain and neck stiffness.  Skin: Negative for color change and rash.  Neurological: Negative for dizziness, weakness, light-headedness and headaches.  Hematological: Negative for adenopathy.       Objective:   Physical Exam  Constitutional: She is oriented to person, place, and time. She appears well-developed and well-nourished. No distress.  HENT:  Head: Normocephalic and atraumatic.  Right Ear: External ear normal.  Left Ear: External ear normal.  Nose: Nose normal.  Mouth/Throat: Oropharynx is clear and moist. No oropharyngeal exudate.  Eyes: Conjunctivae are normal. Pupils are equal, round, and reactive to light. Right eye exhibits no discharge. Left eye exhibits no discharge. No scleral icterus.  Neck: Normal range of motion. Neck supple. No tracheal deviation present. No thyromegaly present.  Cardiovascular: Normal rate, regular rhythm, normal heart sounds and intact distal pulses.  Exam reveals  no gallop and no friction rub.   No murmur heard. Pulmonary/Chest: Effort normal. No accessory muscle usage. Not tachypneic. No respiratory distress. She has no decreased breath sounds. She has no wheezes. She has rhonchi (few scattered). She has no rales. She exhibits no tenderness.  Musculoskeletal: Normal  range of motion. She exhibits no edema and no tenderness.  Lymphadenopathy:    She has no cervical adenopathy.  Neurological: She is alert and oriented to person, place, and time. No cranial nerve deficit. She exhibits normal muscle tone. Coordination normal.  Skin: Skin is warm and dry. No rash noted. She is not diaphoretic. No erythema. No pallor.  Psychiatric: She has a normal mood and affect. Her behavior is normal. Judgment and thought content normal.          Assessment & Plan:

## 2013-03-31 NOTE — Assessment & Plan Note (Signed)
" >>  ASSESSMENT AND PLAN FOR PULMONARY FIBROSIS ([facility]) WRITTEN ON 03/31/2013  2:19 PM BY Blakley Michna A, MD  Symptomatically improved after recent flare of bronchitis. Will continue Prednisone  5mg  daily. Follow up with Dr. McQuaid next month. "

## 2013-03-31 NOTE — Assessment & Plan Note (Signed)
Symptomatically improved after recent flare of bronchitis. Will continue Prednisone 5mg  daily. Follow up with Dr. Lake Bells next month.

## 2013-03-31 NOTE — Assessment & Plan Note (Addendum)
Recent symptoms of acute bronchitis have improved with Medrol, Augmentin and Azithromycin. Will continue to monitor.  Over 46min of which >50% spent in face-to-face contact with patient discussing plan of care

## 2013-03-31 NOTE — Progress Notes (Signed)
Pre-visit discussion using our clinic review tool. No additional management support is needed unless otherwise documented below in the visit note.  

## 2013-04-07 ENCOUNTER — Telehealth: Payer: Self-pay | Admitting: *Deleted

## 2013-04-07 NOTE — Telephone Encounter (Signed)
Patient left a voicemail requesting some more of the liquid abx be called in to pharmacy. She is having another outbreak around her mouth, this happens every so often. It began over the weekend and would like this sent to the pharmacy, it was the very last medication she was given when her trachea was swollen and hurting. It was an antibiotic but she is not sure of the name of the medication just remember it was a liquid. It was not the magic mouthwash.

## 2013-04-07 NOTE — Telephone Encounter (Signed)
An "outbreak around her mouth" sounds like cold sores, not bacterial infection. She needs to be seen prior to Rx.

## 2013-04-08 ENCOUNTER — Ambulatory Visit (INDEPENDENT_AMBULATORY_CARE_PROVIDER_SITE_OTHER): Payer: Medicare Other | Admitting: Internal Medicine

## 2013-04-08 ENCOUNTER — Encounter: Payer: Self-pay | Admitting: Internal Medicine

## 2013-04-08 VITALS — BP 110/76 | HR 86 | Temp 97.5°F | Wt 105.0 lb

## 2013-04-08 DIAGNOSIS — J449 Chronic obstructive pulmonary disease, unspecified: Secondary | ICD-10-CM

## 2013-04-08 DIAGNOSIS — J8410 Pulmonary fibrosis, unspecified: Secondary | ICD-10-CM

## 2013-04-08 DIAGNOSIS — J209 Acute bronchitis, unspecified: Secondary | ICD-10-CM

## 2013-04-08 MED ORDER — AMOXICILLIN-POT CLAVULANATE 250-62.5 MG/5ML PO SUSR: 500.0000 mg | Freq: Three times a day (TID) | ORAL | Status: DC

## 2013-04-08 NOTE — Assessment & Plan Note (Signed)
Recent worsening of symptoms. Unclear if trigger viral URI versus aspiration. Will start Prednisone taper pack and Augmentin. Follow up for recheck Friday or sooner if no improvement.

## 2013-04-08 NOTE — Assessment & Plan Note (Signed)
Symptoms consistent with acute bronchitis. Will start Augmentin, prednisone taper. If any persistent or worsening symptoms, will likely need admission for IV solumedrol. Follow up for recheck on Friday or sooner if no improvement.

## 2013-04-08 NOTE — Telephone Encounter (Signed)
Patient coming in today at 1130

## 2013-04-08 NOTE — Progress Notes (Signed)
Pre-visit discussion using our clinic review tool. No additional management support is needed unless otherwise documented below in the visit note.  

## 2013-04-08 NOTE — Progress Notes (Signed)
Subjective:    Patient ID: Anne [name], female    DOB: [birth year], 63 y.o.   MRN: [id number]  HPI 63 year old female with COPD, pulmonary fibrosis with trach dependence presents for acute visit complaining of four-day history of increased congestion, sinus pressure, shortness of breath, and wheezing. Symptoms began over the weekend. She denies any fever, chills, myalgia. She has continued on her daily prednisone 10 mg and intermittent use of albuterol with no improvement.  Outpatient Prescriptions Prior to Visit  Medication Sig Dispense Refill  . albuterol (PROVENTIL HFA;VENTOLIN HFA) 108 (90 BASE) MCG/ACT inhaler Inhale 2 puffs into the lungs every 6 (six) hours as needed for wheezing.      [name] albuterol (PROVENTIL) (2.5 MG/3ML) 0.083% nebulizer solution Take 2.5 mg by nebulization every 6 (six) hours as needed for wheezing.      [name] ALPRAZolam (XANAX) 0.25 MG tablet Take 0.25 mg by mouth 2 (two) times daily as needed for sleep.      [name] Alum & Mag Hydroxide-Simeth (MAGIC MOUTHWASH) SOLN Take 5 mLs by mouth 3 (three) times daily as needed.      . Artificial Tear Ointment (ARTIFICIAL TEARS) ointment as needed.      [name] azelastine (OPTIVAR) 0.05 % ophthalmic solution Place 1 drop into both eyes 2 (two) times daily.      . cetirizine (ZYRTEC) 10 MG tablet Take 10 mg by mouth daily.      . chlorpheniramine-HYDROcodone (TUSSIONEX) 10-8 MG/5ML LQCR Take 5 mLs by mouth every 12 (twelve) hours as needed.  115 mL  0  . clopidogrel (PLAVIX) 75 MG tablet Take 75 mg by mouth daily.      . Cyanocobalamin (VITAMIN B-12 IJ) Inject as directed every 30 (thirty) days.      . fluticasone (FLONASE) 50 MCG/ACT nasal spray Place 2 sprays into the nose as needed.       . furosemide (LASIX) 20 MG tablet Take 1 tablet (20 mg total) by mouth daily.  30 tablet  4  . montelukast (SINGULAIR) 10 MG tablet Take 10 mg by mouth at bedtime.      . predniSONE (DELTASONE) 10 MG tablet Take 10 mg by mouth. Taking 25-30 mg daily       . PREMARIN 0.625 MG tablet TAKE 1 TABLET BY MOUTH DAILY FOR 30 DAYS  30 tablet  11  . sodium chloride (OCEAN) 0.65 % nasal spray Place 2 sprays into the nose 2 (two) times daily as needed for congestion.       No facility-administered medications prior to visit.   BP 110/76  Pulse 86  Temp(Src) 97.5 F (36.4 C) (Oral)  Wt 105 lb (47.628 kg)  SpO2 97%  Review of Systems  Constitutional: Positive for fatigue. Negative for fever, chills and unexpected weight change.  HENT: Positive for congestion. Negative for ear discharge, ear pain, facial swelling, hearing loss, mouth sores, nosebleeds, postnasal drip, rhinorrhea, sinus pressure, sneezing, sore throat, tinnitus, trouble swallowing and voice change.   Eyes: Negative for pain, discharge, redness and visual disturbance.  Respiratory: Positive for cough, shortness of breath and wheezing. Negative for chest tightness and stridor.   Cardiovascular: Negative for chest pain, palpitations and leg swelling.  Musculoskeletal: Negative for arthralgias, myalgias, neck pain and neck stiffness.  Skin: Negative for color change and rash.  Neurological: Negative for dizziness, weakness, light-headedness and headaches.  Hematological: Negative for adenopathy.       Objective:   Physical Exam  Constitutional: She is oriented to person,  place, and time. She appears well-developed and well-nourished. No distress.  HENT:  Head: Normocephalic and atraumatic.  Right Ear: External ear normal.  Left Ear: External ear normal.  Nose: Nose normal.  Mouth/Throat: Oropharynx is clear and moist. No oropharyngeal exudate.  Eyes: Conjunctivae are normal. Pupils are equal, round, and reactive to light. Right eye exhibits no discharge. Left eye exhibits no discharge. No scleral icterus.  Neck: Normal range of motion. Neck supple. No tracheal deviation present. No thyromegaly present.  Cardiovascular: Normal rate, regular rhythm, normal heart sounds and intact  distal pulses.  Exam reveals no gallop and no friction rub.   No murmur heard. Pulmonary/Chest: Accessory muscle usage present. Not tachypneic. No respiratory distress. She has no decreased breath sounds. She has wheezes. She has rhonchi. She has no rales. She exhibits no tenderness.  Musculoskeletal: Normal range of motion. She exhibits no edema and no tenderness.  Lymphadenopathy:    She has no cervical adenopathy.  Neurological: She is alert and oriented to person, place, and time. No cranial nerve deficit. She exhibits normal muscle tone. Coordination normal.  Skin: Skin is warm and dry. No rash noted. She is not diaphoretic. No erythema. No pallor.  Psychiatric: She has a normal mood and affect. Her behavior is normal. Judgment and thought content normal.          Assessment & Plan:

## 2013-04-08 NOTE — Patient Instructions (Signed)
Increase Prednisone to 60mg  today, then taper by 10mg  daily, then stop at 10mg  daily dosing.

## 2013-04-08 NOTE — Telephone Encounter (Signed)
It is in her mouth not around it and this usually happens in the winter. She is really congested and she usually takes Ibuprofen but she has gotten to the point she has taken too much. Her trachea is really sore and swollen, this happens and usually with the congestion.

## 2013-04-08 NOTE — Telephone Encounter (Signed)
She needs to be seen.

## 2013-04-09 ENCOUNTER — Telehealth: Payer: Self-pay | Admitting: Internal Medicine

## 2013-04-09 NOTE — Telephone Encounter (Signed)
Relevant patient education assigned to patient using Emmi. ° °

## 2013-04-11 ENCOUNTER — Encounter: Payer: Self-pay | Admitting: Internal Medicine

## 2013-04-11 ENCOUNTER — Ambulatory Visit (INDEPENDENT_AMBULATORY_CARE_PROVIDER_SITE_OTHER)
Admission: RE | Admit: 2013-04-11 | Discharge: 2013-04-11 | Disposition: A | Discharge status: Home / Self Care | Payer: Medicare Other | Source: Ambulatory Visit | Attending: Internal Medicine | Admitting: Internal Medicine

## 2013-04-11 ENCOUNTER — Ambulatory Visit (INDEPENDENT_AMBULATORY_CARE_PROVIDER_SITE_OTHER): Payer: Medicare Other | Admitting: Internal Medicine

## 2013-04-11 VITALS — BP 120/70 | HR 77 | Temp 97.6°F | Wt 106.0 lb

## 2013-04-11 DIAGNOSIS — J209 Acute bronchitis, unspecified: Secondary | ICD-10-CM

## 2013-04-11 MED ORDER — SULFAMETHOXAZOLE-TRIMETHOPRIM 200-40 MG/5ML PO SUSP: 20.0000 mL | Freq: Two times a day (BID) | ORAL | Status: DC

## 2013-04-11 NOTE — Assessment & Plan Note (Signed)
No improvement in symptoms with prednisone taper and Augmentin. We discussed several options including adding Azithromycin again or changing to Bactrim. She feels that she had better response in the past to Bactrim. 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 Will change to bactrim and extend prednisone taper. Encouraged more consistent use of Albuterol. Discussed possibility that chronic aspiration playing a role. Discussed importance of follow up with Dr. Lake Bells. Follow up for recheck next week or sooner as needed. Note that CXR today was unchanged.  Over 72min of which >50% spent in face-to-face contact with patient discussing plan of care

## 2013-04-11 NOTE — Patient Instructions (Signed)
Please get chest xray today.  Continue Prednisone 30mg  daily x 3 days, then decrease to 20mg  daily x 3 days, then decrease to 10mg  daily.  Start Bactrim. Stop Augmentin.  Follow up next week or sooner as needed.

## 2013-04-11 NOTE — Progress Notes (Signed)
Pre-visit discussion using our clinic review tool. No additional management support is needed unless otherwise documented below in the visit note.  

## 2013-04-11 NOTE — Progress Notes (Signed)
Subjective:    Patient ID: Anne [name], female    DOB: Apr 20, 1950, 63 y.o.   MRN: [id number]  HPI 63YO female with pulmonary fibrosis, COPD presents to follow up recent bronchitis. She was started on Prednisone taper and Augmentin 3 days ago. She reports persistent symptoms of dyspnea and dry cough. She has been using albuterol nebulizer on occasion, but not on a regular basis. No fever or chills. No chest pain.  Outpatient Encounter Prescriptions as of 04/11/2013  Medication Sig  . albuterol (PROVENTIL HFA;VENTOLIN HFA) 108 (90 BASE) MCG/ACT inhaler Inhale 2 puffs into the lungs every 6 (six) hours as needed for wheezing.  [name] albuterol (PROVENTIL) (2.5 MG/3ML) 0.083% nebulizer solution Take 2.5 mg by nebulization every 6 (six) hours as needed for wheezing.  [name] ALPRAZolam (XANAX) 0.25 MG tablet Take 0.25 mg by mouth 2 (two) times daily as needed for sleep.  [name] Alum & Mag Hydroxide-Simeth (MAGIC MOUTHWASH) SOLN Take 5 mLs by mouth 3 (three) times daily as needed.  . Artificial Tear Ointment (ARTIFICIAL TEARS) ointment as needed.  [name] azelastine (OPTIVAR) 0.05 % ophthalmic solution Place 1 drop into both eyes 2 (two) times daily.  . cetirizine (ZYRTEC) 10 MG tablet Take 10 mg by mouth daily.  . chlorpheniramine-HYDROcodone (TUSSIONEX) 10-8 MG/5ML LQCR Take 5 mLs by mouth every 12 (twelve) hours as needed.  . clopidogrel (PLAVIX) 75 MG tablet Take 75 mg by mouth daily.  . Cyanocobalamin (VITAMIN B-12 IJ) Inject as directed every 30 (thirty) days.  . fluticasone (FLONASE) 50 MCG/ACT nasal spray Place 2 sprays into the nose as needed.   . furosemide (LASIX) 20 MG tablet Take 1 tablet (20 mg total) by mouth daily.  . montelukast (SINGULAIR) 10 MG tablet Take 10 mg by mouth at bedtime.  . predniSONE (DELTASONE) 10 MG tablet Take 10 mg by mouth. Taking 25-30 mg daily  . PREMARIN 0.625 MG tablet TAKE 1 TABLET BY MOUTH DAILY FOR 30 DAYS  . sodium chloride (OCEAN) 0.65 % nasal spray Place 2 sprays into  the nose 2 (two) times daily as needed for congestion.  . [DISCONTINUED] amoxicillin-clavulanate (AUGMENTIN) 250-62.5 MG/5ML suspension Take 10 mLs (500 mg total) by mouth every 8 (eight) hours.  [name] sulfamethoxazole-trimethoprim (BACTRIM,SEPTRA) 200-40 MG/5ML suspension Take 20 mLs by mouth 2 (two) times daily.   BP 120/70  Pulse 77  Temp(Src) 97.6 F (36.4 C) (Oral)  Wt 106 lb (48.081 kg)  SpO2 97%  Review of Systems  Constitutional: Positive for fatigue. Negative for fever, chills and unexpected weight change.  HENT: Positive for congestion. Negative for ear discharge, ear pain, facial swelling, hearing loss, mouth sores, nosebleeds, postnasal drip, rhinorrhea, sinus pressure, sneezing, sore throat, tinnitus, trouble swallowing and voice change.   Eyes: Negative for pain, discharge, redness and visual disturbance.  Respiratory: Positive for cough and shortness of breath. Negative for chest tightness, wheezing and stridor.   Cardiovascular: Negative for chest pain, palpitations and leg swelling.  Musculoskeletal: Negative for arthralgias, myalgias, neck pain and neck stiffness.  Skin: Negative for color change and rash.  Neurological: Negative for dizziness, weakness, light-headedness and headaches.  Hematological: Negative for adenopathy.       Objective:   Physical Exam  Constitutional: She is oriented to person, place, and time. She appears well-developed and well-nourished. No distress.  HENT:  Head: Normocephalic and atraumatic.  Right Ear: External ear normal.  Left Ear: External ear normal.  Nose: Nose normal.  Mouth/Throat: Oropharynx is clear and moist. No oropharyngeal  exudate.  Eyes: Conjunctivae are normal. Pupils are equal, round, and reactive to light. Right eye exhibits no discharge. Left eye exhibits no discharge. No scleral icterus.  Neck: Normal range of motion. Neck supple. No tracheal deviation present. No thyromegaly present.  Cardiovascular: Normal rate,  regular rhythm, normal heart sounds and intact distal pulses.  Exam reveals no gallop and no friction rub.   No murmur heard. Pulmonary/Chest: Effort normal. No accessory muscle usage. Not tachypneic. No respiratory distress. She has no decreased breath sounds. She has wheezes. She has rhonchi (scattered). She has no rales. She exhibits no tenderness.  Musculoskeletal: Normal range of motion. She exhibits no edema and no tenderness.  Lymphadenopathy:    She has no cervical adenopathy.  Neurological: She is alert and oriented to person, place, and time. No cranial nerve deficit. She exhibits normal muscle tone. Coordination normal.  Skin: Skin is warm and dry. No rash noted. She is not diaphoretic. No erythema. No pallor.  Psychiatric: She has a normal mood and affect. Her behavior is normal. Judgment and thought content normal.          Assessment & Plan:

## 2013-04-16 ENCOUNTER — Telehealth: Payer: Self-pay | Admitting: Internal Medicine

## 2013-04-17 ENCOUNTER — Telehealth: Payer: Self-pay | Admitting: Internal Medicine

## 2013-04-17 NOTE — Telephone Encounter (Signed)
Fwd to Dr. Walker 

## 2013-04-17 NOTE — Telephone Encounter (Addendum)
When would you like her to come in? 

## 2013-04-17 NOTE — Telephone Encounter (Signed)
She needs to be seen to evaluate.

## 2013-04-17 NOTE — Telephone Encounter (Signed)
Relevant patient education assigned to patient using Emmi. ° °

## 2013-04-17 NOTE — Telephone Encounter (Signed)
Septra has broken pt mouth out with blisters.  States she spoke with triage last night and was told to contact us to let Dr. Gilford Rile know.  Was told to take benadryl and stop the septra.  States this has been going on since before christmas.  Asking if there is anything else she can do.  States Dr. Gilford Rile may need to talk with Dr. Lake Bells.  States she has been trying not to use her inhaler because she is afraid it will start the infection all over again.

## 2013-04-17 NOTE — Telephone Encounter (Signed)
Either with Raquel tomorrow or with me on Monday 11:30 for 66min

## 2013-04-17 NOTE — Telephone Encounter (Signed)
Spoke patient, informed her Dr. Thomes Dinning instructions and per patient she is still having congestion and shortness of breath. She was hesitant to use the inhaler since her mouth was broken out. She does think another round of a different antibiotic would be helpful, just can not seem to shake whatever it is that she has.

## 2013-04-17 NOTE — Telephone Encounter (Signed)
I agree that she should stop the Septra. Is she still having congestion, cough, shortness of breath? If yes, then we may need to start another antibiotic.

## 2013-04-18 ENCOUNTER — Ambulatory Visit (INDEPENDENT_AMBULATORY_CARE_PROVIDER_SITE_OTHER): Payer: Medicare Other | Admitting: Adult Health

## 2013-04-18 ENCOUNTER — Encounter: Payer: Self-pay | Admitting: Adult Health

## 2013-04-18 VITALS — BP 98/58 | HR 86 | Temp 97.7°F | Resp 12 | Wt 107.5 lb

## 2013-04-18 DIAGNOSIS — J209 Acute bronchitis, unspecified: Secondary | ICD-10-CM

## 2013-04-18 MED ORDER — PREDNISONE 10 MG PO TABS: ORAL_TABLET | ORAL | Status: DC

## 2013-04-18 MED ORDER — ALBUTEROL SULFATE (2.5 MG/3ML) 0.083% IN NEBU
2.5000 mg | INHALATION_SOLUTION | Freq: Once | RESPIRATORY_TRACT | Status: AC
  Administered 2013-04-18: 2.5 mg via RESPIRATORY_TRACT

## 2013-04-18 MED ORDER — CEFDINIR 250 MG/5ML PO SUSR: 250.0000 mg | Freq: Two times a day (BID) | ORAL | Status: DC

## 2013-04-18 NOTE — Progress Notes (Signed)
   Subjective:    Patient ID: Anne [name], female    DOB: 07-04-1950, 63 y.o.   MRN: [id number]  HPI  63YO female with trach, pulmonary fibrosis, COPD presents to follow up recent bronchitis. Was last seen in clinic on 04/11/2013 by Dr. Gilford Rile. Patient was started on Bactrim and prednisone taper was extended. She called the office today stating that shortness of breath was not improved. She had stopped the Bactrim secondary to developing mouth sores a couple of days ago. Note, prior to being on the Bactrim, patient was on prednisone and Augmentin without any improvement. She has albuterol nebulizer but not using as much as she should. She has only used it once today.   Review of Systems  Constitutional: Negative for fever and chills.  Respiratory: Positive for cough, chest tightness, shortness of breath and wheezing.   Cardiovascular: Negative.   Psychiatric/Behavioral: Negative.   All other systems reviewed and are negative.       Objective:   Physical Exam  Constitutional: She is oriented to person, place, and time. She appears well-developed and well-nourished.  Acutely ill  Cardiovascular: Normal rate and regular rhythm.   Pulmonary/Chest: Effort normal. No respiratory distress. She has wheezes. She has no rales.  Musculoskeletal: Normal range of motion.  Neurological: She is alert and oriented to person, place, and time.  Skin: Skin is warm and dry.  Psychiatric: She has a normal mood and affect. Her behavior is normal. Judgment and thought content normal.          Assessment & Plan:

## 2013-04-18 NOTE — Addendum Note (Signed)
Addended by: Wynonia Lawman E on: 04/18/2013 04:03 PM   Modules accepted: Orders

## 2013-04-18 NOTE — Telephone Encounter (Signed)
Patient confirmed appointment today at 3:00 with Raquel.

## 2013-04-18 NOTE — Assessment & Plan Note (Signed)
Changed antibiotic to Omnicef 250 mg bid x 10 days. Provided in liquid form. Albuterol neb given in office. Extend prednisone taper: 60 mg tapering by 5 mg daily until 10 mg then remain on 10mg  until she sees Dr. Lake Bells. RTC if symptoms do not improve by Monday. Go to the ED if respiratory distress.

## 2013-04-18 NOTE — Progress Notes (Signed)
Pre visit review using our clinic review tool, if applicable. No additional management support is needed unless otherwise documented below in the visit note. 

## 2013-04-18 NOTE — Patient Instructions (Signed)
  Start Omnicef 250 mg twice a day for 10 days.  Also begin a prednisone taper as follows:   Day one-6 tablets  Day 2 - 5 1/2 tablets  Day 3 - 5 tablets  Day 4 - 4 1/2 tablets  Day 5 - 4 tablets  Day 6 - 3 1/2 tablets  Day 7 - 3 tablets  Day 8 - 2 1/2 tablets  Day 9 - 2 tablets  Day 10 - 1 1/2 tablets  Day 11 - 1 tablet then stay on this dose (10 mg daily) until you see Dr. Tami Ribas.  Call Monday if your symptoms are not improved.

## 2013-04-22 ENCOUNTER — Ambulatory Visit (INDEPENDENT_AMBULATORY_CARE_PROVIDER_SITE_OTHER): Payer: Medicare Other | Admitting: Internal Medicine

## 2013-04-22 ENCOUNTER — Encounter: Payer: Self-pay | Admitting: Internal Medicine

## 2013-04-22 VITALS — BP 100/70 | HR 77 | Temp 98.0°F | Wt 106.0 lb

## 2013-04-22 DIAGNOSIS — L739 Follicular disorder, unspecified: Secondary | ICD-10-CM | POA: Insufficient documentation

## 2013-04-22 DIAGNOSIS — J209 Acute bronchitis, unspecified: Secondary | ICD-10-CM

## 2013-04-22 DIAGNOSIS — IMO0002 Reserved for concepts with insufficient information to code with codable children: Secondary | ICD-10-CM | POA: Insufficient documentation

## 2013-04-22 DIAGNOSIS — R29898 Other symptoms and signs involving the musculoskeletal system: Secondary | ICD-10-CM

## 2013-04-22 DIAGNOSIS — L738 Other specified follicular disorders: Secondary | ICD-10-CM

## 2013-04-22 DIAGNOSIS — T148XXA Other injury of unspecified body region, initial encounter: Secondary | ICD-10-CM

## 2013-04-22 DIAGNOSIS — L678 Other hair color and hair shaft abnormalities: Secondary | ICD-10-CM

## 2013-04-22 MED ORDER — GENTAMICIN SULFATE 0.1 % EX CREA: 1.0000 "application " | TOPICAL_CREAM | Freq: Three times a day (TID) | CUTANEOUS | Status: DC

## 2013-04-22 NOTE — Progress Notes (Signed)
Subjective:    Patient ID: Anne [name], female    DOB: 04-30-1950, 63 y.o.   MRN: [id number]  HPI 63 year old female with pulmonary fibrosis presents to followup recent episode of acute bronchitis. She reports that symptoms of cough and wheezing have improved with Omnicef. She has noted a small amount of bloody drainage from her trach. However this has not been persistent. She denies any recurrent fever or chills. Shortness of breath has improved.  She is concerned about four-day history of left arm weakness. She woke up on Saturday morning with weakness and numbness in her left hand. She initially thought that she slept on her arm wrong. However, symptoms persisted. Numbness has improved but she continues to have weakness in her hand. In the past, she developed weakness in her left arm and leg and was diagnosed as having a possible TIA. She has also been evaluated in the past for multiple sclerosis however workup has been negative. She has not had any changes in her speech or other focal neurologic deficits.  She also notes she recently bumped her right arm and  tore her skin back. She has been placing Neosporin over this lesion.No fever, chills. No drainage from the area.  She is also concerned about some red bumps over her buttock. She is unsure how long these have been present. They are not painful.   Outpatient Encounter Prescriptions as of 04/22/2013  Medication Sig  . albuterol (PROVENTIL HFA;VENTOLIN HFA) 108 (90 BASE) MCG/ACT inhaler Inhale 2 puffs into the lungs every 6 (six) hours as needed for wheezing.  [name] albuterol (PROVENTIL) (2.5 MG/3ML) 0.083% nebulizer solution Take 2.5 mg by nebulization every 6 (six) hours as needed for wheezing.  [name] ALPRAZolam (XANAX) 0.25 MG tablet Take 0.25 mg by mouth 2 (two) times daily as needed for sleep.  [name] Alum & Mag Hydroxide-Simeth (MAGIC MOUTHWASH) SOLN Take 5 mLs by mouth 3 (three) times daily as needed.  . Artificial Tear Ointment (ARTIFICIAL  TEARS) ointment as needed.  [name] azelastine (OPTIVAR) 0.05 % ophthalmic solution Place 1 drop into both eyes 2 (two) times daily.  . cefdinir (OMNICEF) 250 MG/5ML suspension Take 5 mLs (250 mg total) by mouth 2 (two) times daily.  . cetirizine (ZYRTEC) 10 MG tablet Take 10 mg by mouth daily.  . chlorpheniramine-HYDROcodone (TUSSIONEX) 10-8 MG/5ML LQCR Take 5 mLs by mouth every 12 (twelve) hours as needed.  . clopidogrel (PLAVIX) 75 MG tablet Take 75 mg by mouth daily.  . Cyanocobalamin (VITAMIN B-12 IJ) Inject as directed every 30 (thirty) days.  . fluticasone (FLONASE) 50 MCG/ACT nasal spray Place 2 sprays into the nose as needed.   . furosemide (LASIX) 20 MG tablet Take 1 tablet (20 mg total) by mouth daily.  . montelukast (SINGULAIR) 10 MG tablet Take 10 mg by mouth at bedtime.  . predniSONE (DELTASONE) 10 MG tablet Take 10 mg by mouth. Taking 25-30 mg daily  . predniSONE (DELTASONE) 10 MG tablet Start with 60 mg and taper by 5 mg daily until 10 mg then stay on 10 mg daily.  [name] PREMARIN 0.625 MG tablet TAKE 1 TABLET BY MOUTH DAILY FOR 30 DAYS  . sodium chloride (OCEAN) 0.65 % nasal spray Place 2 sprays into the nose 2 (two) times daily as needed for congestion.  [name] gentamicin cream (GARAMYCIN) 0.1 % Apply 1 application topically 3 (three) times daily.   BP 100/70  Pulse 77  Temp(Src) 98 F (36.7 C) (Oral)  Wt 106 lb (48.081 kg)  SpO2 98%   Review of Systems  Constitutional: Negative for fever, chills, appetite change, fatigue and unexpected weight change.  HENT: Negative for congestion, ear pain, sinus pressure, sore throat, trouble swallowing and voice change.   Eyes: Negative for visual disturbance.  Respiratory: Positive for cough, shortness of breath and wheezing. Negative for stridor.   Cardiovascular: Negative for chest pain, palpitations and leg swelling.  Gastrointestinal: Negative for nausea, vomiting, abdominal pain, diarrhea, constipation, blood in stool, abdominal distention  and anal bleeding.  Genitourinary: Negative for dysuria and flank pain.  Musculoskeletal: Negative for arthralgias, gait problem, myalgias and neck pain.  Skin: Positive for wound. Negative for color change and rash.  Neurological: Positive for weakness and numbness. Negative for dizziness and headaches.  Hematological: Negative for adenopathy. Does not bruise/bleed easily.  Psychiatric/Behavioral: Negative for suicidal ideas, sleep disturbance and dysphoric mood. The patient is not nervous/anxious.        Objective:   Physical Exam  Constitutional: She is oriented to person, place, and time. She appears well-developed and well-nourished. No distress.  HENT:  Head: Normocephalic and atraumatic.  Right Ear: External ear normal.  Left Ear: External ear normal.  Nose: Nose normal.  Mouth/Throat: Oropharynx is clear and moist. No oropharyngeal exudate.  Eyes: Conjunctivae are normal. Pupils are equal, round, and reactive to light. Right eye exhibits no discharge. Left eye exhibits no discharge. No scleral icterus.  Neck: Normal range of motion. Neck supple. No tracheal deviation present. No thyromegaly present.  Cardiovascular: Normal rate, regular rhythm, normal heart sounds and intact distal pulses.  Exam reveals no gallop and no friction rub.   No murmur heard. Pulmonary/Chest: Effort normal. No accessory muscle usage. Not tachypneic. No respiratory distress. She has no decreased breath sounds. She has wheezes (scattered). She has no rhonchi. She has no rales. She exhibits no tenderness.  Musculoskeletal: Normal range of motion. She exhibits no edema and no tenderness.  Lymphadenopathy:    She has no cervical adenopathy.  Neurological: She is alert and oriented to person, place, and time. She displays no atrophy and no tremor. No cranial nerve deficit or sensory deficit. She exhibits normal muscle tone. Coordination normal.  Grip strength 4/5 left hand.   Skin: Skin is warm and dry. No  rash noted. She is not diaphoretic. No erythema. No pallor.     Psychiatric: She has a normal mood and affect. Her behavior is normal. Judgment and thought content normal.          Assessment & Plan:

## 2013-04-22 NOTE — Assessment & Plan Note (Signed)
Skin tear right forearm with clean base. Telfa applied today and samples of Telfa given to pt to redress wound daily. Will continue to monitor.

## 2013-04-22 NOTE — Assessment & Plan Note (Signed)
Symptoms improving on Omnicef. Exam remarkable for scattered wheeze, however much improved compared to previous. Will continue Omnicef and have pt follow up Friday as scheduled with Dr. Lake Bells.

## 2013-04-22 NOTE — Assessment & Plan Note (Addendum)
Recent four day h/o left arm weakness and numbness. Symptoms are concerning for CVA. Will get MRI brain for further evaluation. Note that pt has long h/o focal neurologic deficits and in the past was evaluated by local neurologist for CVA and possible MS. Evaluation reportedly negative. Will set up new neurology evaluation. Question if EMG testing might be helpful for further evaluation.

## 2013-04-22 NOTE — Assessment & Plan Note (Signed)
Folliculitis left buttock. Will have pt apply gentamicin ointment bid. She will call if symptoms are not improving.

## 2013-04-25 ENCOUNTER — Ambulatory Visit (INDEPENDENT_AMBULATORY_CARE_PROVIDER_SITE_OTHER): Payer: Medicare Other | Admitting: Pulmonary Disease

## 2013-04-25 ENCOUNTER — Encounter: Payer: Self-pay | Admitting: Pulmonary Disease

## 2013-04-25 VITALS — BP 106/64 | HR 89 | Ht 63.0 in | Wt 108.0 lb

## 2013-04-25 DIAGNOSIS — J449 Chronic obstructive pulmonary disease, unspecified: Secondary | ICD-10-CM

## 2013-04-25 DIAGNOSIS — J690 Pneumonitis due to inhalation of food and vomit: Secondary | ICD-10-CM

## 2013-04-25 DIAGNOSIS — J8410 Pulmonary fibrosis, unspecified: Secondary | ICD-10-CM

## 2013-04-25 NOTE — Patient Instructions (Signed)
Wean yourself off the prednisone over the next week.  I recommend decreasing to 5mg  now, take it for 5 days,  then going to every other day for 5 days then stop  We will contact Dr. Vena Rua office for you  We will see you back in 6-8 weeks or sooner if needed

## 2013-04-25 NOTE — Progress Notes (Signed)
Subjective:    Patient ID: Anne [name], female    DOB: 06-03-1950, 63 y.o.   MRN: [id number]  Synopsis: She enjoys establish care with the [facility] pulmonary clinic in may of 2014. 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 A CT scan in 2013 performed at [facility] showed some groundglass changes, interstitial thickening, and interlobular thickening in the upper lobes predominantly.  HPI   08/13/2012 ROV >> Anne [name] has had chest congestion, cough, and increasing shortness of breath since Saturday. She has been taking Mucinex for this but it does not help. This is been associated with muscle aches, and fatigue. She has multiple joint aches, and states that her ring finger PIP has been swollen for the last several weeks. She also notes a new rash on her face which is red. She came to our clinic today as a sick visit but also would like to followup the results of the CT scan which was performed last week.  09/03/2012 ROV >> Anne [name] has been doing well since our last visit. After taking the antibiotic her cough went away. She still occasionally has a dry cough with singing and while eating meals. She has noted a significant amount of aspiration in the last several months. She has had chronic difficulty with swallowing. She often feels things like pills in certain foods getting stuck in her chest. She continues to get B12 shots and has done this for years. Her weight has been stable lately. She is continuing to use her medications as documented below. She has not been bleeding from anywhere as far she can tell.  11/05/2012  ROV >> Anne [name] has been having increasing cough, chest congestion and dyspnea for the last week. She's had chills but no fever. She states that some food still gets stuck when she swallows. She has had some sinus congestion but not much postnasal drip. She has not had a significant productive cough. She feels some soreness in the right upper chest/back from coughing.  12/10/2012 ROV >> Since the bronch Anne [name] completed the Cipro > ten days total (finished on Friday of last week) > she said that it helped some.  She feels like she has congetsion between nose and chest. She has been feeling stopped up in head.  She has had to use inhaler some and even had to open her trach a couple of times.  She said that using her inhaler helped some.  She has noted some yellow color to sputum. She has noted chills, but no fever.  01/14/2013 ROV >> Anne [name] still hasn't started the prednisone yet because she doesn't like the way it affects her.  She has had a flu shot since the last visit.  She has been seeing Dr. Hilarie Fredrickson and has plans for an endoscopy.  She says that when [place] was at the beach for ten days she continued to have breathing problems.  These included wheezing, feeling chest congestion and the feeling that she couldn't get a deep breath.  She has been having a lot of joint (knee, joint, shoulder pain) lately.  She had throat swelling after her flu shot this time  that lasted for a few minutes after.    02/25/2013 ROV > Anne [name] started taking prednisone at $RemoveBefor'30mg'HNzjayZPgwrr$  in October and has tapered down to $Remov'10mg'rDVlPY$  daily.  She is having a lot of sinus problems this time of year which is really helping with her voice and overall symptoms.  She is having a little cough and dyspnea. Dyspnea is better since before.  Overall, she is really frustrated by the side effects of the prednisone, primarily sleep loss and flushing in her face.  She is eating yogurt and soft conssitency foods. No meat intake lately.  She thinks she may have aspirated a  couple of times since the last visit.    04/25/2013 ROV >> Since December Anne [name] has had continued chest congestion, coughing and feeling sick. She had a lot of sinus drainage and chest congestion. She has had a lot of soreness in her trachea.  She has a fair amount of mouth blisters with these symptoms.  She also had some mouth sores wih the augmentin.  Her sputum production is dependent on her po intake.  She has noticed bloody secretions from the trachea.  She isn't sleeping well at night on the prednisone.  She has had intermittent numbness and weakness in her hands and wrist.  Her po intake has not improved since the last visit.  She continues to struggle eating meat.  She continues to feel that food starts to "fill up esophagus" and takes a couple of hours to improve.  Even liquids (yogurt and ice cream) do this.  Eating makes her wheeze more.   Past Medical History  Diagnosis Date  . Asthma   . COPD (chronic obstructive pulmonary disease)   . Stroke     slurred speech, drawn face, imaging normal, occurred twice, [facility]  . Migraine   . Allergic rhinitis   . Anemia   . IBS (irritable bowel syndrome)   . Shingles      Review of Systems  Constitutional: Positive for fatigue. Negative for fever and chills.  HENT: Negative for congestion, postnasal drip, rhinorrhea and sinus pressure.   Respiratory: Positive for cough and shortness of breath. Negative for chest tightness.   Cardiovascular: Negative for chest pain, palpitations and leg swelling.       Objective:   Physical Exam  Filed Vitals:   04/25/13 1342  BP: 106/64  Pulse: 89  Height: $Remove'5\' 3"'RZnVfxU$  (1.6 m)  Weight: 108 lb (48.988 kg)  SpO2: 99%  RA  Gen:  Well appearing HEENT: NCAT, EOMi, OP clear,  PULM: upper airway wheezing; no rhonchi or crackles, good air movement  CV: RRR, no mgr, no JVD AB: BS+, soft, nontender, no hsm Ext: warm, no edema, no clubbing, no cyanosis   04/2011 CT chest at [facility]>> emphysema bilaterally; upper lobe groundglass opacification, interstitial thickening, interlobular thickening, and apical cappingnoted bilaterally. This is significantly worsened since the 2007 comparison study. 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  I explained to her that her most recent trouble has been due to recurrent upper and lower respiratory tract infections which are directly related to aspiration of gastric (esophageal) contents.  Until we have a handle on her recurrent infections, I am extremely reluctant to ramp up immunosuppression for incompletely diagnosed pulmonary fibrosis.  Plan: -deal with esophageal/aspiration issues first (see below) -once I am confident food is no longer going into her lungs, she needs an open lung biopsy for a definitive diagnosis -stop prednisone  Aspiration pneumonia Recurrent problem with frequent bouts of bronchitis.  Clearly her cough and bronchitis symptoms are worse on days when she has more trouble swallowing.  Her CT from 09/2012 showed food in her esophagus and we know her esophagus doesn't move properly.  If we can't get her esophageal motility to improve then she will need another (her  third) J-tube so we can eliminate this problem and then move on dealing with her fibrosis.  Plan: -f/u with [facility] for possible botox injections in GE junction vs feeding tube  COPD with asthma This is a presumptive diagnosis given that she has never been able to perform PFTs (trach). Stable despite frequent bronchitis Continue Symbicort (renewed today)     Updated Medication List Outpatient Encounter Prescriptions as of 04/25/2013  Medication Sig  . albuterol (PROVENTIL HFA;VENTOLIN HFA) 108 (90 BASE) MCG/ACT inhaler Inhale 2 puffs into the lungs every 6 (six) hours as needed for wheezing.  [name] albuterol (PROVENTIL) (2.5 MG/3ML) 0.083% nebulizer solution Take 2.5 mg by nebulization every 6 (six) hours as needed for wheezing.  [name] ALPRAZolam (XANAX) 0.25 MG tablet Take 0.25 mg by mouth 2 (two) times daily as needed for sleep.  [name] Alum & Mag Hydroxide-Simeth (MAGIC MOUTHWASH) SOLN Take 5 mLs by mouth 3 (three) times daily as needed.  . Artificial Tear Ointment (ARTIFICIAL TEARS) ointment as needed.  [name] azelastine (OPTIVAR) 0.05 % ophthalmic solution Place 1 drop into both eyes 2 (two) times daily.  . cetirizine (ZYRTEC) 10 MG tablet Take 10 mg by mouth daily.  . chlorpheniramine-HYDROcodone (TUSSIONEX) 10-8 MG/5ML LQCR Take 5 mLs by mouth every 12 (twelve) hours as needed.  . clopidogrel (PLAVIX) 75 MG tablet Take 75 mg by mouth daily.  . Cyanocobalamin (VITAMIN B-12 IJ) Inject as directed every 30 (thirty) days.  . fluticasone (FLONASE) 50 MCG/ACT nasal spray Place 2 sprays into the nose as needed.   . furosemide (LASIX) 20 MG tablet Take 1 tablet (20 mg total) by mouth daily.  [name] gentamicin cream (GARAMYCIN) 0.1 % Apply 1 application topically 3 (three) times daily.  [name] HYDROcodone-acetaminophen (NORCO/VICODIN) 5-325 MG per tablet 1 tablet every 6 (six) hours as needed.   . montelukast (SINGULAIR) 10 MG tablet Take 10 mg by mouth at bedtime.  . predniSONE (DELTASONE) 10 MG tablet Take 10  mg by mouth.   [name] PREMARIN 0.625 MG tablet TAKE 1 TABLET BY MOUTH DAILY FOR 30 DAYS  . sodium chloride (OCEAN) 0.65 % nasal spray Place 2 sprays into the nose 2 (two) times daily as needed for congestion.  . [DISCONTINUED] cefdinir (OMNICEF) 250 MG/5ML suspension Take 5 mLs (250 mg total) by mouth 2 (two) times daily.  . [DISCONTINUED] predniSONE (DELTASONE) 10 MG tablet Start with 60 mg and taper by 5 mg  daily until 10 mg then stay on 10 mg daily.

## 2013-04-28 ENCOUNTER — Telehealth: Payer: Self-pay | Admitting: *Deleted

## 2013-04-28 MED ORDER — FLUTICASONE PROPIONATE 50 MCG/ACT NA SUSP: 2.0000 | Freq: Every day | NASAL | Status: DC

## 2013-04-28 MED ORDER — BUDESONIDE-FORMOTEROL FUMARATE 80-4.5 MCG/ACT IN AERO: 2.0000 | INHALATION_SPRAY | Freq: Two times a day (BID) | RESPIRATORY_TRACT | Status: DC

## 2013-04-28 NOTE — Assessment & Plan Note (Addendum)
" >>  ASSESSMENT AND PLAN FOR ASTHMA-COPD OVERLAP SYNDROME (HCC) WRITTEN ON 04/28/2013  8:23 AM BY Sherriann Szuch B, MD  This is a presumptive diagnosis given that she has never been able to perform PFTs (trach). Stable despite frequent bronchitis Continue Symbicort  (renewed today)    >>ASSESSMENT AND PLAN FOR PULMONARY FIBROSIS (HCC) WRITTEN ON 04/28/2013  8:16 AM BY Adylynn Hertenstein B, MD  We still don't know exactly what we are dealing with, see multiple length discussions regarding this form prior notes.  I explained to her that her most recent trouble has been due to recurrent upper and lower respiratory tract infections which are directly related to aspiration of gastric (esophageal) contents.  Until we have a handle on her recurrent infections, I am extremely reluctant to ramp up immunosuppression for incompletely diagnosed pulmonary fibrosis.  Plan: -deal with esophageal/aspiration issues first (see below) -once I am confident food is no longer going into her lungs, she needs an open lung biopsy for a definitive diagnosis -stop prednisone  "

## 2013-04-28 NOTE — Assessment & Plan Note (Signed)
Recurrent problem with frequent bouts of bronchitis.  Clearly her cough and bronchitis symptoms are worse on days when she has more trouble swallowing.  Her CT from 09/2012 showed food in her esophagus and we know her esophagus doesn't move properly.  If we can't get her esophageal motility to improve then she will need another (her third) J-tube so we can eliminate this problem and then move on dealing with her fibrosis.  Plan: -f/u with [facility] for possible botox injections in GE junction vs feeding tube

## 2013-04-28 NOTE — Telephone Encounter (Signed)
Pt called and we spoke of her discussion with Dr Lake Bells and then Dr Anastasia Pall discussion with Dr Hilarie Fredrickson. Dr Hilarie Fredrickson has asked me to call her and since this is his hospital week, perhaps he could do her EGD with Botox this week. When I pulled up her schedule, I realized she is on PLAVIX and will need to be off that for 5-7 days, so this week isn't possible. Pt states before she decides she needs to know if she would get Botox and a J tube at the same. Dr Lake Bells wants a surgeon to insert the tube if she decides to do it. She has had 2 J tubes before, but they both came out. She is not sure if she can make a decision never to eat food again. When asked about her Plavix, she states Dr Gilford Rile can advise about that; she's stopped it before. Informed pt I will call her back. Spoke with Dr Hilarie Fredrickson who states the Botox will be done to see if she can function w/o a tube, however, it is doubtful the Botox will stop her aspiration. Pt is still willing to try and wants to know the side effects ot what to experrrct

## 2013-04-28 NOTE — Assessment & Plan Note (Signed)
We still don't know exactly what we are dealing with, see multiple length discussions regarding this form prior notes.  I explained to her that her most recent trouble has been due to recurrent upper and lower respiratory tract infections which are directly related to aspiration of gastric (esophageal) contents.  Until we have a handle on her recurrent infections, I am extremely reluctant to ramp up immunosuppression for incompletely diagnosed pulmonary fibrosis.  Plan: -deal with esophageal/aspiration issues first (see below) -once I am confident food is no longer going into her lungs, she needs an open lung biopsy for a definitive diagnosis -stop prednisone

## 2013-04-29 ENCOUNTER — Ambulatory Visit: Payer: Self-pay | Admitting: Internal Medicine

## 2013-04-30 ENCOUNTER — Telehealth: Payer: Self-pay | Admitting: Internal Medicine

## 2013-04-30 ENCOUNTER — Ambulatory Visit (INDEPENDENT_AMBULATORY_CARE_PROVIDER_SITE_OTHER): Payer: Medicare Other | Admitting: Neurology

## 2013-04-30 ENCOUNTER — Other Ambulatory Visit: Payer: Self-pay | Admitting: Neurology

## 2013-04-30 ENCOUNTER — Encounter: Payer: Self-pay | Admitting: Neurology

## 2013-04-30 VITALS — BP 98/60 | HR 84 | Temp 97.7°F | Ht 63.0 in | Wt 106.0 lb

## 2013-04-30 DIAGNOSIS — R29898 Other symptoms and signs involving the musculoskeletal system: Secondary | ICD-10-CM

## 2013-04-30 DIAGNOSIS — R209 Unspecified disturbances of skin sensation: Secondary | ICD-10-CM

## 2013-04-30 LAB — HEMOGLOBIN A1C
Hgb A1c MFr Bld: 5.9 % — ABNORMAL HIGH (ref <5.7)
Mean Plasma Glucose: 123 mg/dL — ABNORMAL HIGH (ref <117)

## 2013-04-30 LAB — VITAMIN B12: Vitamin B-12: 638 pg/mL (ref 211–911)

## 2013-04-30 LAB — TSH: TSH: 0.753 u[IU]/mL (ref 0.350–4.500)

## 2013-04-30 LAB — CK: Total CK: 57 U/L (ref 7–177)

## 2013-04-30 NOTE — Telephone Encounter (Signed)
MRI brain was normal

## 2013-04-30 NOTE — Progress Notes (Signed)
[facility] Clinic Note - Initial Visit   Date: 04/30/2013    Anne [name] MRN: [id number] DOB: 1950/07/03   Dear Dr Gilford Rile:  Thank you for your kind referral of Anne [name] for consultation of left arm weakness. Although her history is well known to you, please allow [place] to reiterate it for the purpose of our medical record. The patient was accompanied to the clinic by self.   History of Present Illness: Anne [name] is a 63 y.o. 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    On 2/24, she woke up from a nap with left hand numbness over the dorsal surface of the hand which resolved within 4 hours.  She also reports tripping over the left leg earlier in the week.  She has difficulty with fine motor movement with her left hand.  She also feels as if she is walking on crumbs and sensation of buzzing involving the legs.  She has a long history of neurological issues which never resulted in a specific diagnosis.  About 15 years ago, she woke up one morning with elevated left face and speech changes. She went to the emergency department and was evaluated by neurology who did not feel that she has a stroke. Imaging at the time was negative.  About 10 years ago, she had a similar spell of difficulty talking and weaknes.  She went to her PCP who recommended that she went to the hospital for evaluation. Again, she saw a neurologist who did not feel that she had a stroke.  In late 1990s, she saw a neurologist who evaluated her for multiple sclerosis but did not find that her symptoms were consistent with this.  She also saw Dr. Nadara Mustard at [facility] for [facility] and her NCS/EMG which was negative.  She also saw Dr. 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  Out-side paper  records, electronic medical record, and images have been reviewed where available and summarized as:  Component     Latest Ref Rng 08/13/2012  Anti JO-1     0.0 - 0.9 AI <0.2  Crypto Ag     NEGATIVE NEG  Scleroderma (Scl-70) (ENA) Antibody, IgG     <30 AU/mL <1  SSA (Ro) (ENA) Antibody, IgG     <30 AU/mL 4  SSB (La) (ENA) Antibody, IgG     <30 AU/mL <1     Past Medical History  Diagnosis Date  . Asthma   . COPD (chronic obstructive pulmonary disease)   . Stroke     slurred speech, drawn face, imaging normal, occurred twice, [facility]  . Migraine   . Allergic rhinitis   . Anemia   . IBS (irritable bowel syndrome)   . Shingles     Past Surgical History  Procedure Laterality Date  . Abdominal hysterectomy    . Tubal ligation    . Tracheostomy  1996    done at [facility], Dr. Kathyrn Sheriff  . Video bronchoscopy Bilateral 11/20/2012    Procedure: VIDEO BRONCHOSCOPY WITH FLUORO;  Surgeon: Juanito Doom, MD;  Location: [facility];  Service: Cardiopulmonary;  Laterality: Bilateral;  . Jejunostomy feeding tube      x2 both failed  . Esophageal manometry N/A 12/16/2012    Procedure: ESOPHAGEAL MANOMETRY (EM);  Surgeon: Jerene Bears, MD;  Location: [facility];  Service: Gastroenterology;  Laterality: N/A;     Medications:  Current Outpatient Prescriptions on File Prior to Visit  Medication Sig Dispense Refill  . ALPRAZolam (XANAX) 0.25 MG tablet Take 0.25 mg by mouth 2 (two) times daily as needed for sleep.      [name] Alum & Mag Hydroxide-Simeth (MAGIC MOUTHWASH) SOLN Take 5 mLs by mouth 3 (three) times daily as needed.      . Artificial Tear Ointment (ARTIFICIAL TEARS) ointment as needed.      [name] azelastine (OPTIVAR) 0.05 % ophthalmic solution Place 1 drop into both eyes 2 (two) times daily.      . budesonide-formoterol (SYMBICORT) 80-4.5 MCG/ACT inhaler Inhale 2 puffs into the lungs 2 (two) times daily.  1 Inhaler  5  . cetirizine (ZYRTEC) 10 MG tablet Take 10 mg by mouth daily.      .  chlorpheniramine-HYDROcodone (TUSSIONEX) 10-8 MG/5ML LQCR Take 5 mLs by mouth every 12 (twelve) hours as needed.  115 mL  0  . clopidogrel (PLAVIX) 75 MG tablet Take 75 mg by mouth daily.      . Cyanocobalamin (VITAMIN B-12 IJ) Inject as directed every 30 (thirty) days.      . fluticasone (FLONASE) 50 MCG/ACT nasal spray Place 2 sprays into both nostrils daily.  16 g  2  . furosemide (LASIX) 20 MG tablet Take 1 tablet (20 mg total) by mouth daily.  30 tablet  4  . gentamicin cream (GARAMYCIN) 0.1 % Apply 1 application topically 3 (three) times daily.  15 g  0  . HYDROcodone-acetaminophen (NORCO/VICODIN) 5-325 MG per tablet 1 tablet every 6 (six) hours as needed.       . montelukast (SINGULAIR) 10 MG tablet Take 10 mg by mouth at bedtime.      . predniSONE (DELTASONE) 10 MG tablet Take 10 mg by mouth.       [name] PREMARIN 0.625 MG tablet TAKE 1 TABLET BY MOUTH DAILY FOR 30 DAYS  30 tablet  11  . sodium chloride (OCEAN) 0.65 % nasal spray Place 2 sprays into the nose 2 (two) times daily as needed for congestion.       No current facility-administered medications on file prior to visit.    Allergies:  Allergies  Allergen Reactions  . Shellfish Allergy Anaphylaxis  . Asa [Aspirin]   . Avelox [Moxifloxacin]     Increased heart rate  . Eggs Or Egg-Derived Products     GI Upset  . Influenza Vaccines   . Levaquin [Levofloxacin In D5w]   . Septra [Sulfamethoxazole-Tmp Ds]     Mouth sores    Family History: Family History  Problem Relation Age of Onset  . Asthma Cousin   . COPD Cousin   . Breast cancer Maternal Grandmother   . Cancer Maternal Grandmother     breast  . Breast cancer Maternal Aunt   . Cancer Maternal Aunt     breast  . Asthma Father   . Cancer Father     lung  . Cancer Paternal Uncle     lung  . COPD Paternal Grandfather     Social History: History   Social History  . Marital Status: Married    Spouse Name: N/A    Number of Children: 4  . Years of Education:  N/A   Occupational History  . Not on file.   Social History Main Topics  . Smoking status: Never Smoker   . Smokeless tobacco: Never Used  .  Alcohol Use: No  . Drug Use: No  . Sexual Activity: Not on file   Other Topics Concern  . Not on file   Social History Narrative   Lives in [place] with husband    Review of Systems:  CONSTITUTIONAL: No fevers, chills, night sweats, or weight loss.   EYES: No visual changes or eye pain ENT: No hearing changes.  No history of nose bleeds.   RESPIRATORY: No cough, wheezing +shortness of breath.   CARDIOVASCULAR: Negative for chest pain, and palpitations.   GI: Negative for abdominal discomfort, blood in stools or black stools. + dysphagia GU:  No history of incontinence.   MUSCLOSKELETAL: No history of joint pain or swelling.  No myalgias.   SKIN: Negative for lesions, rash, and itching.   HEMATOLOGY/ONCOLOGY: Negative for prolonged bleeding, bruising easily, and swollen nodes.     ENDOCRINE: Negative for cold or heat intolerance, polydipsia or goiter.   PSYCH:  No depression or anxiety symptoms.   NEURO: As Above.   Vital Signs:  BP 98/60  Pulse 84  Temp(Src) 97.7 F (36.5 C) (Oral)  Ht 5\' 3"  (1.6 m)  Wt 106 lb (48.081 kg)  BMI 18.78 kg/m2   General Medical Exam:   General:  Thin-appearing, comfortable.   Eyes/ENT: see cranial nerve examination.   Neck: No masses appreciated.  Full range of motion without tenderness.  No carotid bruits. Respiratory:  Bilateral wheezing, trach Cardiac:  Regular rate and rhythm, no murmur.   Back:  No pain to palpation of spinous processes.   Extremities:  No deformities, edema, or skin discoloration. Good capillary refill.   Skin:  Skin color, texture, turgor normal. No rashes or lesions.  Neurological Exam: MENTAL STATUS including orientation to time, place, person, recent and remote memory, attention span and concentration, language, and fund of knowledge is normal.  Speech is not  dysarthric.  CRANIAL NERVES: II:  No visual field defects.  Unremarkable fundi.   III-IV-VI: Pupils equal round and reactive to light.  Normal conjugate, extra-ocular eye movements in all directions of gaze.  No nystagmus.  No ptosis.   V:  Normal facial sensation.  Jaw jerk is absent.   VII:  Normal facial symmetry and movements.  No pathologic facial reflexes.  VIII:  Normal hearing and vestibular function.   IX-X:  Normal palatal movement.   XI:  Normal shoulder shrug and head rotation.   XII:  Normal tongue strength and range of motion, no deviation or fasciculation.  MOTOR:  Generalized atrophy.  No fasciculations or abnormal movements.  No pronator drift.  Tone is normal.   **give-way weakness on the left side Right Upper Extremity:    Left Upper Extremity: **   Deltoid  5/5   Deltoid  4+/5   Biceps  5/5   Biceps  5/5   Triceps  5/5   Triceps  4+/5   Wrist extensors  5/5   Wrist extensors  4/5   Wrist flexors  5/5   Wrist flexors  5/5   Finger extensors  5/5   Finger extensors  4+/5   Finger flexors  5/5   Finger flexors  4+/5   Dorsal interossei  5/5   Dorsal interossei  4+/5   Abductor pollicis  5/5   Abductor pollicis  4+/5   Tone (Ashworth scale)  0  Tone (Ashworth scale)  0   Right Lower Extremity:    Left Lower Extremity: **   Hip flexors  5/5  Hip flexors  5-/5   Hip extensors  5/5   Hip extensors  5/5   Knee flexors  5/5   Knee flexors  5/5   Knee extensors  5/5   Knee extensors  5/5   Dorsiflexors  5/5   Dorsiflexors  5/5   Plantarflexors  5/5   Plantarflexors  5/5   Toe extensors  5/5   Toe extensors  5/5   Toe flexors  5/5   Toe flexors  5/5   Tone (Ashworth scale)  0  Tone (Ashworth scale)  0   MSRs:  Right                                                                 Left brachioradialis 2+  brachioradialis 2+  biceps 2+  biceps 2+  triceps 2+  triceps 2+  patellar 2+  patellar 2+  ankle jerk 1+  ankle jerk 1+  Hoffman no  Hoffman no  plantar  response mute  plantar response mute   SENSORY:  Pin prick and vibration is reduced on the left side and distally in the legs.  Romberg's sign absent.   COORDINATION/GAIT: Normal finger-to- nose-finger and heel-to-shin.  Intact rapid alternating movements bilaterally.  Able to rise from a chair without using arms.  Gait narrow based and stable. Tandem and stressed gait intact. She is able to perform deep knee bend and raise unassisted.  IMPRESSION: Ms. Jasmin is a 63 year-old female presenting for evaluation of left arm weakness and numbness.  Her neurological examination shows give-way weakness on the left side making it difficult to accurately assess the strength of the muscles.  There is reduced sensation left side also.  Other aspects of the neurological exam is normal.  I am not entirely sure whether her symptoms represent a subacute stroke or cervical radiculopathy, though this would not explain leg symptoms.  I would like to see what her MRI brain shows and also obtain EMG and lab testing for neuropathy/myopathy.  Due to restraints of time, I did not get to talk about her swallowing problems in detail, but she has been to several academic centers where her work-up for myasthenia and muscular dystrophy was negative.    PLAN/RECOMMENDATIONS:  1. CK, vitamin B12, TSH, copper, HbA1c 2. EMG of the left arm 3. Review imaging once it is available 4. Return to clinic in 6-month   The duration of this appointment visit was 45 minutes of face-to-face time with the patient.  Greater than 50% of this time was spent in counseling, explanation of diagnosis, planning of further management, and coordination of care.   Thank you for allowing me to participate in patient's care.  If I can answer any additional questions, I would be pleased to do so.    Sincerely,    Donika K. Posey Pronto, DO

## 2013-04-30 NOTE — Patient Instructions (Addendum)
1.  Check blood work today. Please go to [facility] lab.  2.  EMG of the left arm in 51-month 3.  Review imaging once it is available 4.  Return to clinic in 60-month

## 2013-05-01 ENCOUNTER — Telehealth: Payer: Self-pay | Admitting: Neurology

## 2013-05-01 NOTE — Telephone Encounter (Signed)
I think that EMG would be helpful to determine why she has left arm weakness, but if she does not want it then I will see her back at her follow-up and see how she is doing at that visit.  Thank you.

## 2013-05-01 NOTE — Telephone Encounter (Signed)
Patient informed and verbalized understanding

## 2013-05-01 NOTE — Telephone Encounter (Signed)
Pt called stating that her PCP went over her MRI results and the MRI results came back normal. She wants to know if she needs to keep her EMG studies. She feels that she doesn't need to do the EMG but unless Dr. Posey Pronto insists on her having the studies done then she will keep it.   She would still like to keep her f/u up appointment in March.

## 2013-05-02 ENCOUNTER — Telehealth: Payer: Self-pay | Admitting: Pulmonary Disease

## 2013-05-02 ENCOUNTER — Telehealth: Payer: Self-pay | Admitting: *Deleted

## 2013-05-02 LAB — COPPER, SERUM: Copper: 213 ug/dL — ABNORMAL HIGH (ref 70–175)

## 2013-05-02 NOTE — Telephone Encounter (Signed)
Returning call can be reached at [phone].Elnita Maxwell

## 2013-05-02 NOTE — Telephone Encounter (Signed)
lmomtcb x1 

## 2013-05-02 NOTE — Telephone Encounter (Signed)
Spoke to pt and she said to leave the EMG on the books for now. She wants to give it some more thought and will call us back if she wants to cancel it.

## 2013-05-05 MED ORDER — AZITHROMYCIN 200 MG/5ML PO SUSR: ORAL | Status: DC

## 2013-05-05 NOTE — Telephone Encounter (Signed)
Spoke with pt. Reports coughing, SOB, chest tightness, wheezing and fatigue. Symptoms started back when the weather changed per the pt. Has congestion when she starts to eat. Has been using inhalers as directed with minimal relief. Would like to know how to proceed.  PW- please advise in BQ's absence. Thanks.

## 2013-05-05 NOTE — Telephone Encounter (Signed)
Pt is aware of PW's recs. ROV has been moved up to 05/26/13 from 06/11/13. Rx will be sent in.

## 2013-05-05 NOTE — Telephone Encounter (Signed)
Since she aspirates, trial azithromycin 250mg  Take two once then one daily until gone #6   Needs rov with BQ on his return in [place]

## 2013-05-06 ENCOUNTER — Other Ambulatory Visit: Payer: Self-pay | Admitting: *Deleted

## 2013-05-06 ENCOUNTER — Encounter: Payer: Self-pay | Admitting: Internal Medicine

## 2013-05-06 ENCOUNTER — Encounter: Payer: Self-pay | Admitting: Neurology

## 2013-05-06 ENCOUNTER — Telehealth: Payer: Self-pay | Admitting: Neurology

## 2013-05-06 DIAGNOSIS — R29898 Other symptoms and signs involving the musculoskeletal system: Secondary | ICD-10-CM

## 2013-05-06 DIAGNOSIS — Z930 Tracheostomy status: Secondary | ICD-10-CM

## 2013-05-06 DIAGNOSIS — R209 Unspecified disturbances of skin sensation: Secondary | ICD-10-CM

## 2013-05-06 DIAGNOSIS — R1310 Dysphagia, unspecified: Secondary | ICD-10-CM

## 2013-05-06 DIAGNOSIS — K224 Dyskinesia of esophagus: Secondary | ICD-10-CM

## 2013-05-06 NOTE — Telephone Encounter (Signed)
Scheduled pt for EGD with Botox for 06/12/13 around 09:30am at [facility]. I have written up the prep instructions and I sent a staff message to Dr Ronette Deter for Plavix discontinuation; pt stated Dr Gilford Rile has done this before. Pt wanted post op activity and per Dr Hilarie Fredrickson, she should take it easy after the procedure and rest. She is on a soft diet and we will advance as tolerated; pt stated understanding.

## 2013-05-06 NOTE — Telephone Encounter (Signed)
I called her this morning with results.  Donika K. Posey Pronto, DO

## 2013-05-06 NOTE — Telephone Encounter (Signed)
Called patient with results of lab work.  Labs within normal limits, except copper is slightly elevated.  She denies taking any dietary supplements.   I discussed that copper levels were being checked for deficiency since that may cause neuropathy, but elevated levels do not. I will check a ceruloplasmin level.  Donika K. Posey Pronto, DO

## 2013-05-06 NOTE — Telephone Encounter (Signed)
Lab ordered.

## 2013-05-07 LAB — CERULOPLASMIN: Ceruloplasmin: 48 mg/dL (ref 20–60)

## 2013-05-08 ENCOUNTER — Other Ambulatory Visit (INDEPENDENT_AMBULATORY_CARE_PROVIDER_SITE_OTHER): Payer: Medicare Other

## 2013-05-08 DIAGNOSIS — R29898 Other symptoms and signs involving the musculoskeletal system: Secondary | ICD-10-CM

## 2013-05-08 DIAGNOSIS — R209 Unspecified disturbances of skin sensation: Secondary | ICD-10-CM

## 2013-05-08 NOTE — Telephone Encounter (Signed)
Message Received: Today     Anne Confer, MD Olene Floss, RN            General guidelines would recommend stopping Plavix for 5 to 7 days prior to her procedure. I would recommend erring on the side of caution and stopping the Plavix for 7 days prior to the procedure    Informed pt I will be sending her new instructions for her EGD with information on Plavix.

## 2013-05-08 NOTE — Telephone Encounter (Signed)
Please call.

## 2013-05-08 NOTE — Telephone Encounter (Signed)
PATIENT WANTS TO KNOW LAB RESULTS AND NEEDS TO TALK TO SOMEONE ABOUT LABS

## 2013-05-09 NOTE — Telephone Encounter (Signed)
PT called/returned Ashley's call at 12:17PM. Please call her back.

## 2013-05-09 NOTE — Telephone Encounter (Signed)
Patient wanted to know what the ceruloplasim lab was checking for. I inform her it checked her copper

## 2013-05-09 NOTE — Telephone Encounter (Signed)
Please call.

## 2013-05-09 NOTE — Telephone Encounter (Signed)
Left message on machine of normal lab results

## 2013-05-10 LAB — CERULOPLASMIN: Ceruloplasmin: 49 mg/dL (ref 20–60)

## 2013-05-12 NOTE — Telephone Encounter (Signed)
Noted.  Will discuss further at next visit.  Emmely Bittinger K. Posey Pronto, DO

## 2013-05-15 ENCOUNTER — Other Ambulatory Visit: Payer: Self-pay | Admitting: Internal Medicine

## 2013-05-15 ENCOUNTER — Encounter: Payer: Self-pay | Admitting: Neurology

## 2013-05-15 NOTE — Telephone Encounter (Signed)
Ok refill? 

## 2013-05-16 ENCOUNTER — Encounter: Payer: Self-pay | Admitting: Internal Medicine

## 2013-05-19 NOTE — Telephone Encounter (Signed)
Returned call to patient and discussed that it is uncertain as to the etiology of copper levels being elevated, since she is not taking any supplements.  Ceruloplasmin level is normal.  May recheck levels going forward and/or check urine copper.  Renal and hepatic function is normal.  I also explained that I was looking for copper deficiency since that may contribute to neuropathy.  I will also forward to her gastroenterologist and PCP to see if they have additional thoughts.  All questions were answered.  Donika K. Posey Pronto, DO

## 2013-05-21 ENCOUNTER — Telehealth: Payer: Self-pay | Admitting: Neurology

## 2013-05-21 ENCOUNTER — Telehealth: Payer: Self-pay | Admitting: Internal Medicine

## 2013-05-21 NOTE — Telephone Encounter (Signed)
Pt states she has an appt for botox and she has had some bloating. Wanted to know if there were other supplements other than boost and ensure. Discussed other supplements and cream soups and pureeing her food. Pt states she will try that and keep her appt.

## 2013-05-21 NOTE — Telephone Encounter (Signed)
Spoke with patient. Her hands are doing better so she does not feel like she needs an EMG at this time. She is still having the left leg numbness she was having at her visit. She is concerned and thinks this may be coming from her back. She has a history of back pain. She states changing positions does not help and this just tends to come and go. She wants to know whether she should follow up with you re: this or if she should see Dr Jefm Bryant who she has seen in the past for back pain (orthopaedic MD). Dr Posey Pronto- Please advise.

## 2013-05-21 NOTE — Telephone Encounter (Signed)
Pt has cancelled EMG . She is feeling better. She will follow up w/ Dr. Hilarie Fredrickson regarding copper levels. She has some concerns w/ leg pain, please call [phone] / Venida Jarvis

## 2013-05-22 ENCOUNTER — Encounter (HOSPITAL_COMMUNITY): Payer: Self-pay | Admitting: Pharmacy Technician

## 2013-05-22 ENCOUNTER — Ambulatory Visit: Payer: Medicare Other | Admitting: Internal Medicine

## 2013-05-23 ENCOUNTER — Encounter (HOSPITAL_COMMUNITY): Payer: Self-pay | Admitting: *Deleted

## 2013-05-23 NOTE — Telephone Encounter (Signed)
Patient made aware. She cancelled her follow up with Dr Posey Pronto and will follow up with Dr Jefm Bryant and Dr Hilarie Fredrickson.

## 2013-05-23 NOTE — Telephone Encounter (Signed)
Since she is already established with ortho for back pain, it is reasonable to follow-up with him.  I'm glad that her arms are doing better in which case it is reasonable to cancel EMG.    Korie Streat K. Posey Pronto, DO

## 2013-05-26 ENCOUNTER — Ambulatory Visit (INDEPENDENT_AMBULATORY_CARE_PROVIDER_SITE_OTHER): Payer: Medicare Other | Admitting: Pulmonary Disease

## 2013-05-26 VITALS — BP 98/56 | HR 70 | Ht 63.0 in | Wt 109.0 lb

## 2013-05-26 DIAGNOSIS — J690 Pneumonitis due to inhalation of food and vomit: Secondary | ICD-10-CM

## 2013-05-26 DIAGNOSIS — J8410 Pulmonary fibrosis, unspecified: Secondary | ICD-10-CM

## 2013-05-26 DIAGNOSIS — J449 Chronic obstructive pulmonary disease, unspecified: Secondary | ICD-10-CM

## 2013-05-26 NOTE — Assessment & Plan Note (Signed)
Due to esophageal dysmotility and lower esophageal increased sphincter tone.  Plan:  -Esophageal Botox injections planned for later this month

## 2013-05-26 NOTE — Assessment & Plan Note (Addendum)
" >>  ASSESSMENT AND PLAN FOR ASTHMA-COPD OVERLAP SYNDROME (HCC) WRITTEN ON 05/26/2013  1:26 PM BY Jeidy Hoerner B, MD  Stable interval in this regard.  Plan: -Continue Symbicort    >>ASSESSMENT AND PLAN FOR PULMONARY FIBROSIS (HCC) WRITTEN ON 05/26/2013  1:34 PM BY Kinslei Labine B, MD  Not really clear what is going on. She clearly has recurrent aspiration which interferes with the evaluation of poor interstitial lung disease. I explained to her today that it's entirely possible that aspiration is the only etiology.  Plan: -Once aspiration is out of the picture (hopefully after the upcoming procedure) then we will consider an open lung biopsy and further immunosuppressive therapy if needed "

## 2013-05-26 NOTE — Assessment & Plan Note (Addendum)
Not really clear what is going on. She clearly has recurrent aspiration which interferes with the evaluation of poor interstitial lung disease. I explained to her today that it's entirely possible that aspiration is the only etiology.  Plan: -Once aspiration is out of the picture (hopefully after the upcoming procedure) then we will consider an open lung biopsy and further immunosuppressive therapy if needed

## 2013-05-26 NOTE — Progress Notes (Signed)
Subjective:    Patient ID: Anne [name], female    DOB: 19-Mar-1951, 63 y.o.   MRN: [id number]  Synopsis: She enjoys establish care with the [facility] pulmonary clinic in may of 2014. 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 A CT scan in 2013 performed at [facility] showed some groundglass changes, interstitial thickening, and interlobular thickening in the upper lobes predominantly.  This had progressed since 2011.  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  She was also evaluated by [facility] in 2014 and was found to have significant esophageal dysmotility and increased LES tone.  She has had a J tube on two separate occasions in the past.    HPI    05/26/2013 ROV >> Anne [name] was treated with azithromycin which we called in for cough and congestion a few weeks ago.  She continues to have nausea and vomiting after eating and the sensation that she is completely full.  She feels bloated often.  She has been trying really hard to keep food out of her lungs. She feels soreness in her trachea and nasal congestion in the last few days.  Interestingly, these symptoms seem to corellate with food getting stuck in her lungs.  She notes a lot of fatigue.  She takes a lot of ibuprofen 2-3 pills at a time multiple times a day.  She has been taking this for generalized achiness throughout the day.    Past Medical History  Diagnosis Date  . Asthma   . COPD (chronic obstructive  pulmonary disease)   . Stroke     slurred speech, drawn face, imaging normal, occurred twice, [facility]  . Migraine   . Allergic rhinitis   . Anemia   . IBS (irritable bowel syndrome)   . Shingles   . Tracheostomy in place   . Complication of anesthesia     Breathing problems  . Pulmonary fibrosis   . Vocal cord paresis      Review of Systems  Constitutional: Positive for fatigue. Negative for fever and chills.  HENT: Positive for postnasal drip, rhinorrhea, sinus pressure and sore throat. Negative for congestion.   Respiratory: Positive for shortness of breath. Negative for cough and chest tightness.   Cardiovascular: Negative for chest pain, palpitations and leg swelling.       Objective:   Physical Exam  Filed Vitals:   05/26/13 1023 05/26/13 1027 05/26/13 1029  BP:   98/56  Pulse:  70   Height: $Remove'5\' 3"'rFcMFng$  (1.6 m)    Weight: 109 lb (49.442 kg)    SpO2:  100%   RA  Gen:  Well appearing HEENT: NCAT, EOMi, OP clear,  PULM: mild upper airway wheezing, good air movement  CV: RRR, no mgr, no JVD AB: BS+, soft, nontender, no hsm Ext: warm, no edema, no clubbing, no cyanosis   04/2011 [facility] chest at [facility]>> emphysema bilaterally; upper lobe groundglass opacification, interstitial thickening, interlobular thickening, and apical cappingnoted bilaterally. This is significantly worsened since  the 2007 comparison study. 07/2012 CT chest [facility] >> upper lobe interstitial thickening, consolidation, peripepheral and apical in distribution slightly increased from prior; also new clusters of ggo nodules in the RLL 07/2012 ANA neg, RF < 10, ESR 55 (high), CRP 10.6, IgE 3.1 (normal), SSB < 1, SSA 4 normal, SCL-70 neg, Crypto Ag neg, Anti-Jo-1 <0.2; CBC with diff > mild anemia, no eosinophilia 07/2012 HP panel negative 07/2012 Aspergillus Ab panel/precipitins negative 08/2012 6MW 1300 feet rest HR 72, O2 97%, 6 min HR 119, O2 sat 88% 10/2012 Bronch> pseudomonas on BAL;  multinucleated giant cell on BAL cytology; TBBX histology "stromal fibrosis" 02/25/2013 6 MW > 1400 feet, O2 saturation nadir 93% RA     Assessment & Plan:   COPD with asthma Stable interval in this regard.  Plan: -Continue Symbicort  Postinflammatory pulmonary fibrosis Not really clear what is going on. She clearly has recurrent aspiration which interferes with the evaluation of poor interstitial lung disease. I explained to her today that it's entirely possible that aspiration is the only etiology.  Plan: -Once aspiration is out of the picture (hopefully after the upcoming procedure) then we will consider an open lung biopsy and further immunosuppressive therapy if needed  Aspiration pneumonia Due to esophageal dysmotility and lower esophageal increased sphincter tone.  Plan:  -Esophageal Botox injections planned for later this month    Updated Medication List Outpatient Encounter Prescriptions as of 05/26/2013  Medication Sig  . albuterol (PROVENTIL HFA;VENTOLIN HFA) 108 (90 BASE) MCG/ACT inhaler Inhale 1 puff into the lungs every 6 (six) hours as needed for wheezing or shortness of breath.  . ALPRAZolam (XANAX) 0.25 MG tablet Take 0.125 mg by mouth 2 (two) times daily as needed for anxiety.  . cetirizine (ZYRTEC) 10 MG tablet Take 10 mg by mouth daily.  . chlorpheniramine-HYDROcodone (TUSSIONEX) 10-8 MG/5ML LQCR Take 5 mLs by mouth every 12 (twelve) hours as needed for cough.  . clopidogrel (PLAVIX) 75 MG tablet Take 75 mg by mouth every morning.   . Cyanocobalamin (VITAMIN B-12 IJ) Inject as directed every 30 (thirty) days.  [name] estrogens, conjugated, (PREMARIN) 0.625 MG tablet Take 0.625 mg by mouth every evening.  . furosemide (LASIX) 20 MG tablet Take 20 mg by mouth every morning.  [name] HYDROcodone-acetaminophen (NORCO/VICODIN) 5-325 MG per tablet Take 1 tablet by mouth every 6 (six) hours as needed for moderate pain.  . hydroxypropyl methylcellulose (ISOPTO TEARS) 2.5 %  ophthalmic solution Place 1 drop into both eyes 3 (three) times daily as needed for dry eyes.  [name] ibuprofen (ADVIL,MOTRIN) 200 MG tablet Take 400-600 mg by mouth every 6 (six) hours as needed for mild pain or moderate pain.  . montelukast (SINGULAIR) 10 MG tablet Take 10 mg by mouth at bedtime.  . traMADol (ULTRAM) 50 MG tablet Take 25 mg by mouth every 6 (six) hours as needed for moderate pain.  [name] trimethoprim-polymyxin b (POLYTRIM) ophthalmic solution Place 1 drop into both eyes every 6 (six) hours.

## 2013-05-26 NOTE — Patient Instructions (Signed)
Use your tussionex to help with cough, but use it with caution wtih the hydrocodone-acetaminophen tablet you take for pain Use saline rinses in your nose at least twice a day as well as phenylephrine over the counter (or pseudophed) We will see you back in 8 weeks or sooner if needed

## 2013-05-27 ENCOUNTER — Encounter: Payer: Self-pay | Admitting: Internal Medicine

## 2013-05-27 ENCOUNTER — Other Ambulatory Visit: Payer: Self-pay | Admitting: *Deleted

## 2013-05-27 ENCOUNTER — Telehealth: Payer: Self-pay | Admitting: *Deleted

## 2013-05-27 MED ORDER — HYDROCODONE-ACETAMINOPHEN 5-325 MG PO TABS: 1.0000 | ORAL_TABLET | Freq: Four times a day (QID) | ORAL | Status: DC | PRN

## 2013-05-27 NOTE — Telephone Encounter (Signed)
She is reports actually feeling better, so there is no need for testing.  We can cancel order and notify patient.  Donika K. Posey Pronto, DO

## 2013-05-27 NOTE — Telephone Encounter (Signed)
[facility] lab called.  Pt has appt for lab tomorrow.  Dx will not cover HGBA1C.   I looked through notes but could not find another dx to cover it.  Can you think of another dx?  Thanks.

## 2013-05-27 NOTE — Telephone Encounter (Signed)
This test was already done in Feb.  No current orders noted.

## 2013-05-28 ENCOUNTER — Encounter: Payer: Medicare Other | Admitting: Neurology

## 2013-05-29 ENCOUNTER — Encounter: Payer: Medicare Other | Admitting: Neurology

## 2013-05-30 ENCOUNTER — Telehealth: Payer: Self-pay | Admitting: Pulmonary Disease

## 2013-05-30 MED ORDER — PREDNISONE 10 MG PO TABS: ORAL_TABLET | ORAL | Status: DC

## 2013-05-30 MED ORDER — IPRATROPIUM-ALBUTEROL 0.5-2.5 (3) MG/3ML IN SOLN: 3.0000 mL | RESPIRATORY_TRACT | Status: DC | PRN

## 2013-05-30 NOTE — Telephone Encounter (Signed)
duoneb OK Prednisone: Take 40mg  po daily for 3 days, then take 30mg  po daily for 3 days, then take 20mg  po daily for two days, then take 10mg  po daily for 2 days

## 2013-05-30 NOTE — Telephone Encounter (Signed)
Pt is aware of BQ's recs. Both rx's have been sent in. Nothing further is needed.

## 2013-05-30 NOTE — Telephone Encounter (Signed)
Spoke with pt. Reports that her breathing has gotten worse since seeing BQ on Monday. SOB, hoarseness and coughing have increased. Has been using nebulizer with Duoneb. She is requesting a new prescription for Duoneb and Prednisone to be called in. Duoneb was given to her by Dr. Vella Kohler in 2013.  BQ - please advise. Thanks.

## 2013-06-02 ENCOUNTER — Telehealth: Payer: Self-pay | Admitting: Internal Medicine

## 2013-06-02 NOTE — Telephone Encounter (Signed)
All questions answered.  She will call back for any additional questions or concerns.   

## 2013-06-04 ENCOUNTER — Encounter: Payer: Self-pay | Admitting: Internal Medicine

## 2013-06-04 ENCOUNTER — Telehealth: Payer: Self-pay | Admitting: *Deleted

## 2013-06-04 ENCOUNTER — Ambulatory Visit (INDEPENDENT_AMBULATORY_CARE_PROVIDER_SITE_OTHER): Payer: Medicare Other | Admitting: Internal Medicine

## 2013-06-04 ENCOUNTER — Telehealth: Payer: Self-pay | Admitting: Pulmonary Disease

## 2013-06-04 ENCOUNTER — Telehealth: Payer: Self-pay

## 2013-06-04 VITALS — BP 102/60 | HR 91 | Temp 98.2°F | Wt 108.0 lb

## 2013-06-04 DIAGNOSIS — J209 Acute bronchitis, unspecified: Secondary | ICD-10-CM

## 2013-06-04 DIAGNOSIS — J8410 Pulmonary fibrosis, unspecified: Secondary | ICD-10-CM

## 2013-06-04 MED ORDER — AMOXICILLIN-POT CLAVULANATE 400-57 MG PO CHEW: 2.0000 | CHEWABLE_TABLET | Freq: Two times a day (BID) | ORAL | Status: AC

## 2013-06-04 MED ORDER — PREDNISONE 10 MG PO TABS: ORAL_TABLET | ORAL | Status: DC

## 2013-06-04 NOTE — Telephone Encounter (Signed)
Patient called stating there was no prescription for Prednisone at the pharmacy today. She has almost used up all of what she has at home and she is not sure of how you would like her to take it.

## 2013-06-04 NOTE — Progress Notes (Signed)
Subjective:    Patient ID: Anne [name], female    DOB: 1950-12-28, 63 y.o.   MRN: [id number]  HPI 18EX with chronic lung disease presents for follow up.  Notes increased shortness of breath since last Thursday. Called Dr. Lake Bells who started Prednisone taper. She has also been using Albuterol nebulizer. No fever, chills. Symptoms have not improved with prednisone.  Continues to have dyspnea triggered by eating. Notes coughing and dyspnea with eating solid foods. Scheduled for EGD with possible botox injection.  Review of Systems  Constitutional: Positive for fatigue. Negative for fever, chills and unexpected weight change.  HENT: Negative for congestion, ear discharge, ear pain, facial swelling, hearing loss, mouth sores, nosebleeds, postnasal drip, rhinorrhea, sinus pressure, sneezing, sore throat, tinnitus, trouble swallowing and voice change.   Eyes: Negative for pain, discharge, redness and visual disturbance.  Respiratory: Positive for cough, chest tightness and shortness of breath. Negative for wheezing and stridor.   Cardiovascular: Negative for chest pain, palpitations and leg swelling.  Musculoskeletal: Negative for arthralgias, myalgias, neck pain and neck stiffness.  Skin: Negative for color change and rash.  Neurological: Negative for dizziness, weakness, light-headedness and headaches.  Hematological: Negative for adenopathy.       Objective:    BP 102/60  Pulse 91  Temp(Src) 98.2 F (36.8 C) (Oral)  Wt 108 lb (48.988 kg)  SpO2 95% Physical Exam  Constitutional: She is oriented to person, place, and time. She appears well-developed and well-nourished. No distress.  HENT:  Head: Normocephalic and atraumatic.  Right Ear: External ear normal.  Left Ear: External ear normal.  Nose: Nose normal.  Mouth/Throat: Oropharynx is clear and moist. No oropharyngeal exudate.  Eyes: Conjunctivae are normal. Pupils are equal, round, and reactive to light. Right eye exhibits  no discharge. Left eye exhibits no discharge. No scleral icterus.  Neck: Normal range of motion. Neck supple. No tracheal deviation present. No thyromegaly present.  Cardiovascular: Normal rate, regular rhythm, normal heart sounds and intact distal pulses.  Exam reveals no gallop and no friction rub.   No murmur heard. Pulmonary/Chest: Accessory muscle usage present. Not tachypneic. No respiratory distress. She has decreased breath sounds. She has no wheezes. She has rhonchi. She has no rales. She exhibits no tenderness.  Musculoskeletal: Normal range of motion. She exhibits no edema and no tenderness.  Lymphadenopathy:    She has no cervical adenopathy.  Neurological: She is alert and oriented to person, place, and time. No cranial nerve deficit. She exhibits normal muscle tone. Coordination normal.  Skin: Skin is warm and dry. No rash noted. She is not diaphoretic. No erythema. No pallor.  Psychiatric: She has a normal mood and affect. Her behavior is normal. Judgment and thought content normal.          Assessment & Plan:   Problem List Items Addressed This Visit   Acute bronchitis - Primary     Progressive symptoms of acute bronchitis despite use of Prednisone taper started earlier this week. Discussed with Dr. Lake Bells. Will start Augmentin and increase prednisone dosing. Continue use of albuterol. Symptoms likely exacerbated by chronic aspiration of food, and EGD for further evaluation planned for next week. Follow up for recheck on Friday. If symptoms worsen in the interim, will need to consider admission for IV antibiotics and steroids.    Relevant Medications      amoxicillin-clavulanate (AUGMENTIN) 400-57 MG per chewable tablet   Postinflammatory pulmonary fibrosis     Pt has chronic interstitial lung disease  and chronic aspiration. Plans for EGD and further evaluation of chronic aspiration prior to proceeding with possible open lung biopsy for further evaluation of ILD and  consideration of immunosuppresive therapy.        Return in about 2 days (around 06/06/2013).

## 2013-06-04 NOTE — Telephone Encounter (Signed)
Sorry. I thought she said she had plenty. Can you call in Prednisone 10mg  tablets, 60mg  po daily x3 days, 50mg  po daily x 3 days, 40mg  po daily x 3 days, 30mg  po daily x 3 days, 20mg  daily x3 days, then 10mg  daily x3 days then stop. #63

## 2013-06-04 NOTE — Progress Notes (Signed)
Pre visit review using our clinic review tool, if applicable. No additional management support is needed unless otherwise documented below in the visit note. 

## 2013-06-04 NOTE — Telephone Encounter (Signed)
Prescription sent to the pharmacy.

## 2013-06-04 NOTE — Telephone Encounter (Signed)
PA has been initiated.  Will route to nurse to follow up on.  

## 2013-06-04 NOTE — Patient Instructions (Signed)
Increase Prednisone to 60mg  daily.  I will call you with further instructions after I speak with Dr. Lake Bells.

## 2013-06-04 NOTE — Telephone Encounter (Signed)
According to phone note 06/04/13 Caryl Pina has started PA for pt duoneb. Called [facility] and was advised they do not have part B for pt only part D. Will forward to [place] to follow up on approval/denial. Pt is aware

## 2013-06-05 NOTE — Assessment & Plan Note (Signed)
Progressive symptoms of acute bronchitis despite use of Prednisone taper started earlier this week. Discussed with Dr. Lake Bells. Will start Augmentin and increase prednisone dosing. Continue use of albuterol. Symptoms likely exacerbated by chronic aspiration of food, and EGD for further evaluation planned for next week. Follow up for recheck on Friday. If symptoms worsen in the interim, will need to consider admission for IV antibiotics and steroids.

## 2013-06-05 NOTE — Assessment & Plan Note (Signed)
Pt has chronic interstitial lung disease and chronic aspiration. Plans for EGD and further evaluation of chronic aspiration prior to proceeding with possible open lung biopsy for further evaluation of ILD and consideration of immunosuppresive therapy.

## 2013-06-05 NOTE — Assessment & Plan Note (Signed)
" >>  ASSESSMENT AND PLAN FOR PULMONARY FIBROSIS (HCC) WRITTEN ON 06/05/2013 10:20 AM BY Denni France A, MD  Pt has chronic interstitial lung disease and chronic aspiration. Plans for EGD and further evaluation of chronic aspiration prior to proceeding with possible open lung biopsy for further evaluation of ILD and consideration of immunosuppresive therapy. "

## 2013-06-06 ENCOUNTER — Telehealth: Payer: Self-pay | Admitting: Pulmonary Disease

## 2013-06-06 ENCOUNTER — Encounter: Payer: Self-pay | Admitting: Internal Medicine

## 2013-06-06 ENCOUNTER — Ambulatory Visit (INDEPENDENT_AMBULATORY_CARE_PROVIDER_SITE_OTHER): Payer: Medicare Other | Admitting: Internal Medicine

## 2013-06-06 VITALS — BP 102/60 | HR 80 | Temp 98.0°F | Wt 107.0 lb

## 2013-06-06 DIAGNOSIS — J209 Acute bronchitis, unspecified: Secondary | ICD-10-CM

## 2013-06-06 DIAGNOSIS — T17908A Unspecified foreign body in respiratory tract, part unspecified causing other injury, initial encounter: Secondary | ICD-10-CM | POA: Insufficient documentation

## 2013-06-06 MED ORDER — ALBUTEROL SULFATE (2.5 MG/3ML) 0.083% IN NEBU: 2.5000 mg | INHALATION_SOLUTION | Freq: Four times a day (QID) | RESPIRATORY_TRACT | Status: DC | PRN

## 2013-06-06 MED ORDER — IPRATROPIUM-ALBUTEROL 0.5-2.5 (3) MG/3ML IN SOLN: 3.0000 mL | RESPIRATORY_TRACT | Status: DC | PRN

## 2013-06-06 NOTE — Progress Notes (Signed)
   Subjective:    Patient ID: Anne [name], female    DOB: 1950-05-27, 63 y.o.   MRN: [id number]  HPI 63YO female with ILD and chronic aspiration presents to follow up recent bronchitis.  Started on Augmentin and Prednisone at last visit 3/11. Medications not filled until yesterday because of insurance issues.  Also using outdated Albuterol (2013) because of insurance issues with getting medication. Cough has improved slightly. Continues to have some dyspnea, exacerbated when eating. Notes wheezing. Feels exhausted.No fever.  Review of Systems  Constitutional: Positive for fatigue. Negative for fever, chills and unexpected weight change.  HENT: Negative for congestion, ear pain, postnasal drip, rhinorrhea, sinus pressure, sore throat, trouble swallowing and voice change.   Eyes: Negative for pain, discharge, redness and visual disturbance.  Respiratory: Positive for chest tightness, shortness of breath and wheezing. Negative for cough and stridor.   Cardiovascular: Negative for chest pain, palpitations and leg swelling.  Musculoskeletal: Negative for arthralgias, myalgias, neck pain and neck stiffness.  Skin: Negative for color change and rash.  Neurological: Negative for dizziness, weakness, light-headedness and headaches.  Hematological: Negative for adenopathy.       Objective:    BP 102/60  Pulse 80  Temp(Src) 98 F (36.7 C) (Oral)  Wt 107 lb (48.535 kg)  SpO2 98% Physical Exam  Constitutional: She is oriented to person, place, and time. She appears well-developed and well-nourished. No distress.  HENT:  Head: Normocephalic and atraumatic.  Right Ear: External ear normal.  Left Ear: External ear normal.  Nose: Nose normal.  Mouth/Throat: Oropharynx is clear and moist. No oropharyngeal exudate.  Eyes: Conjunctivae are normal. Pupils are equal, round, and reactive to light. Right eye exhibits no discharge. Left eye exhibits no discharge. No scleral icterus.  Neck: Normal  range of motion. Neck supple. No tracheal deviation present. No thyromegaly present.  Cardiovascular: Normal rate, regular rhythm, normal heart sounds and intact distal pulses.  Exam reveals no gallop and no friction rub.   No murmur heard. Pulmonary/Chest: Accessory muscle usage present. Not tachypneic. No respiratory distress. She has decreased breath sounds. She has wheezes (diffuse). She has rhonchi. She has no rales. She exhibits no tenderness.  Musculoskeletal: Normal range of motion. She exhibits no edema and no tenderness.  Lymphadenopathy:    She has no cervical adenopathy.  Neurological: She is alert and oriented to person, place, and time. No cranial nerve deficit. She exhibits normal muscle tone. Coordination normal.  Skin: Skin is warm and dry. No rash noted. She is not diaphoretic. No erythema. No pallor.  Psychiatric: She has a normal mood and affect. Her behavior is normal. Judgment and thought content normal.          Assessment & Plan:   Problem List Items Addressed This Visit   Acute bronchitis - Primary     Symptoms are only minimally improved, however oxygen saturation improved on exam today. Will continue prednisone taper 60mg  x 2 more days, then 50-40-30-20-10 each for 3 days. Continue Augmentin. New Rx for Albuterol written. Encouraged her to use this. Plan follow up here in 2 weeks and prn.    Chronic pulmonary aspiration     Chronic aspiration appears to be contributing to recurrent pulmonary infections. EGD is scheduled for next week for further evaluation and possible botox injection. May need to reconsider feeding tube in the future.        Return in about 2 weeks (around 06/20/2013).

## 2013-06-06 NOTE — Telephone Encounter (Signed)
RX has been resubmitted w/ dx code on it. Nothing further needed

## 2013-06-06 NOTE — Assessment & Plan Note (Signed)
Symptoms are only minimally improved, however oxygen saturation improved on exam today. Will continue prednisone taper 60mg  x 2 more days, then 50-40-30-20-10 each for 3 days. Continue Augmentin. New Rx for Albuterol written. Encouraged her to use this. Plan follow up here in 2 weeks and prn.

## 2013-06-06 NOTE — Assessment & Plan Note (Addendum)
Chronic aspiration appears to be contributing to recurrent pulmonary infections. EGD is scheduled for next week for further evaluation and possible botox injection. May need to reconsider feeding tube in the future.

## 2013-06-06 NOTE — Progress Notes (Signed)
Pre visit review using our clinic review tool, if applicable. No additional management support is needed unless otherwise documented below in the visit note. 

## 2013-06-10 NOTE — Telephone Encounter (Signed)
PA for Duoneb has been denied.  Called [facility] to verify if med was covered if ran through Medicare part B.  Spoke with Pharmacist Dieu-Ha and she verified that the medication was approved through Medicare B and pt has picked up medication.  Nothing further needed at this time.

## 2013-06-11 ENCOUNTER — Ambulatory Visit: Payer: Medicare Other | Admitting: Pulmonary Disease

## 2013-06-11 ENCOUNTER — Telehealth: Payer: Self-pay | Admitting: Internal Medicine

## 2013-06-11 NOTE — Telephone Encounter (Signed)
Patient is scheduled for EGD with Botox tomorrow at [facility].  She has an acute bronchitis that she is being treated with high dose steroids and antibiotics started on 06/06/13.  She reports that she is still SOB at rest and DOE, she is also audibly wheezing.  She is willing to proceed or at least come to the hospital tomorrow and determine if safe to proceed with the procedure.  See note from 06/06/13 Dr. Gilford Rile.  Please advise

## 2013-06-11 NOTE — Telephone Encounter (Signed)
Patient notified of procedure day change and instructions.  She will call back for any additional questions or concerns

## 2013-06-11 NOTE — Telephone Encounter (Signed)
Discussed with Dr. Hilarie Fredrickson.  He also consulted with Dr. Lake Bells.  Patient is increased risk and needs to be rescheduled to next week.  She is to make sure she holds plavix again starting Friday.  She is rescheduled for 06/17/13 12:30.   I have left a message for her to call back

## 2013-06-16 ENCOUNTER — Telehealth: Payer: Self-pay | Admitting: Internal Medicine

## 2013-06-16 NOTE — Telephone Encounter (Signed)
Patient is still sick with acute brohchitis and her husband has a stomach virus.  She is advised that will need to reschedule for May, Dr. Vena Rua next hospital week. [name]  She wants to call back to reschedule

## 2013-06-17 ENCOUNTER — Ambulatory Visit (HOSPITAL_COMMUNITY): Admission: RE | Admit: 2013-06-17 | Payer: Medicare Other | Source: Ambulatory Visit | Admitting: Internal Medicine

## 2013-06-17 ENCOUNTER — Telehealth: Payer: Self-pay | Admitting: Pulmonary Disease

## 2013-06-17 HISTORY — DX: Other complications of anesthesia, initial encounter: T88.59XA

## 2013-06-17 HISTORY — DX: Tracheostomy status: Z93.0

## 2013-06-17 HISTORY — DX: Pulmonary fibrosis, unspecified: J84.10

## 2013-06-17 HISTORY — DX: Adverse effect of unspecified anesthetic, initial encounter: T41.45XA

## 2013-06-17 HISTORY — DX: Paralysis of vocal cords and larynx, unspecified: J38.00

## 2013-06-17 SURGERY — EGD (ESOPHAGOGASTRODUODENOSCOPY)
Anesthesia: Monitor Anesthesia Care

## 2013-06-17 NOTE — Telephone Encounter (Signed)
Called and spoke with pt. appt scheduled to see BQ tomorrow in [place]. Nothing further needed

## 2013-06-18 ENCOUNTER — Ambulatory Visit (INDEPENDENT_AMBULATORY_CARE_PROVIDER_SITE_OTHER)
Admission: RE | Admit: 2013-06-18 | Discharge: 2013-06-18 | Disposition: A | Discharge status: Home / Self Care | Payer: Medicare Other | Source: Ambulatory Visit | Attending: Pulmonary Disease | Admitting: Pulmonary Disease

## 2013-06-18 ENCOUNTER — Ambulatory Visit (INDEPENDENT_AMBULATORY_CARE_PROVIDER_SITE_OTHER): Payer: Medicare Other | Admitting: Pulmonary Disease

## 2013-06-18 ENCOUNTER — Other Ambulatory Visit: Payer: Self-pay

## 2013-06-18 ENCOUNTER — Encounter: Payer: Self-pay | Admitting: Pulmonary Disease

## 2013-06-18 VITALS — BP 98/60 | HR 72 | Temp 97.5°F | Ht 63.0 in | Wt 107.0 lb

## 2013-06-18 DIAGNOSIS — J449 Chronic obstructive pulmonary disease, unspecified: Secondary | ICD-10-CM

## 2013-06-18 DIAGNOSIS — J069 Acute upper respiratory infection, unspecified: Secondary | ICD-10-CM | POA: Insufficient documentation

## 2013-06-18 DIAGNOSIS — J209 Acute bronchitis, unspecified: Secondary | ICD-10-CM

## 2013-06-18 DIAGNOSIS — R0602 Shortness of breath: Secondary | ICD-10-CM

## 2013-06-18 MED ORDER — CIPROFLOXACIN-CIPROFLOX HCL ER 500 MG PO TB24: 500.0000 mg | ORAL_TABLET | Freq: Every day | ORAL | Status: DC

## 2013-06-18 MED ORDER — PREDNISONE 10 MG PO TABS: 10.0000 mg | ORAL_TABLET | Freq: Every day | ORAL | Status: DC

## 2013-06-18 MED ORDER — BENZONATATE 100 MG PO CAPS: 100.0000 mg | ORAL_CAPSULE | Freq: Four times a day (QID) | ORAL | Status: DC | PRN

## 2013-06-18 NOTE — Assessment & Plan Note (Addendum)
As noted above there is little evidence today that she has an acute pulmonary problem. However, I do question ongoing upper respiratory tract infection with sinusitis and postnasal drip causing hoarseness.  The audible wheeze is only coming from her upper airway and specifically her vocal cords.  Fortunately she has a tracheostomy tube so she can rest her vocal cords by breathing through the tube this week.  Plan: -Switch antibiotics to Cipro as the only organism we have ever isolated from her to Pseudomonas -Slow prednisone taper over 7 more days -Voice rest, breathing through trach is encouraged, suppressing cough is encouraged

## 2013-06-18 NOTE — Assessment & Plan Note (Signed)
She had a very very mild wheeze after I removed the From tracheostomy. Therefore her wheezing is not from an asthma or COPD-type process.

## 2013-06-18 NOTE — Progress Notes (Signed)
Subjective:    Patient ID: Anne [name], female    DOB: 01-10-1951, 63 y.o.   MRN: [id number]  Synopsis: She enjoys establish care with the [facility] pulmonary [facility] in may of 2014. 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 A CT scan in 2013 performed at [facility] showed some groundglass changes, interstitial thickening, and interlobular thickening in the upper lobes predominantly.  This had progressed since 2011.  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  She was also evaluated by [facility] in 2014 and was found to have significant esophageal dysmotility and increased LES tone.  She has had a J tube on two separate occasions in the past.    HPI    05/26/2013 ROV >> Anne [name] was treated with azithromycin which we called in for cough and congestion a few weeks ago.  She continues to have nausea and vomiting after eating and the sensation that she is completely full.  She feels bloated often.  She has been trying really hard to keep food out of her lungs. She feels soreness in her trachea and nasal congestion in the last few days.  Interestingly, these symptoms seem to corellate with food getting stuck in her lungs.  She notes a lot of fatigue.  She takes a lot of ibuprofen 2-3 pills at a time multiple times a day.  She has been taking this for generalized achiness throughout the day.    06/18/2013 ROV > Anne [name] continue to feel terrible.  She as been having trouble  breathing.  She took the prednisone and the augmentin but felt She is coughing but it is dry and rare.  She notes wheezing.    Past Medical History  Diagnosis Date  . Asthma   . COPD (chronic obstructive pulmonary disease)   . Stroke     slurred speech, drawn face, imaging normal, occurred twice, [facility]  . Migraine   . Allergic rhinitis   . Anemia   . IBS (irritable bowel syndrome)   . Shingles   . Tracheostomy in place   . Complication of anesthesia     Breathing problems  . Pulmonary fibrosis   . Vocal cord paresis      Review of Systems  Constitutional: Positive for fatigue. Negative for fever and chills.  HENT: Positive for postnasal drip, rhinorrhea, sinus pressure and sore throat. Negative for congestion.   Respiratory: Positive for shortness of breath. Negative for cough and chest tightness.   Cardiovascular: Negative for chest pain, palpitations and leg swelling.       Objective:   Physical Exam  Filed Vitals:   06/18/13 1039  BP: 98/60  Pulse: 72  Temp: 97.5 F (36.4 C)  TempSrc: Oral  Height: _0  (1.6 m)  Weight: 107 lb (48.535 kg)  SpO2: 100%  RA  06/18/2013 Ambulated 500 feet on RA and O2 saturation never dropped less than 97%  Gen:  Mildly ill appearing, hoarse voice HEENT: NCAT, EOMi, OP clear,  PULM: Loud upper airway  wheeze noted prior to removing tracheostomy cap, after removing tracheostomy wheezing nearly completely disappeared and air movement was fantastic  CV: RRR, no mgr, no JVD AB: BS+, soft, nontender, no hsm Ext: warm, no edema, no clubbing, no cyanosis   [facility]>> emphysema bilaterally; upper lobe groundglass opacification, interstitial thickening, interlobular thickening, and apical cappingnoted bilaterally. This is significantly worsened since the 2007 comparison study. 07/2012 CT chest [facility] >> upper lobe interstitial thickening, consolidation, peripepheral and apical in distribution  slightly increased from prior; also new clusters of ggo nodules in the RLL 07/2012 ANA neg, RF < 10, ESR 55 (high), CRP 10.6, IgE 3.1 (normal), SSB < 1, SSA 4 normal, SCL-70 neg, Crypto Ag neg, Anti-Jo-1 <0.2; CBC with diff > mild anemia, no eosinophilia 07/2012 HP panel negative 07/2012 Aspergillus Ab panel/precipitins negative 08/2012 6MW 1300 feet rest HR 72, O2 97%, 6 min HR 119, O2 sat 88% 10/2012 Bronch> pseudomonas on BAL; multinucleated giant cell on BAL cytology; TBBX histology "stromal fibrosis" 02/25/2013 6 MW > 1400 feet, O2 saturation nadir 93% RA     Assessment & Plan:   Acute bronchitis I believe this problem is resolving. She still has a lot of upper airway wheezing and hoarseness with sinus congestion which has persisted despite antibiotics and prednisone.  Interestingly, today her wheezing nearly completely resolved when I uncapped her tracheostomy. This tells me that the vast majority of her wheezing from the current illness comes from an upper airway problem and is not to do to asthma, COPD, or lower respiratory tract infections.    COPD with asthma She had a very very mild wheeze after I removed the From tracheostomy. Therefore her wheezing is not from an asthma or COPD-type process.    Acute upper respiratory infections of unspecified site As noted above there is little evidence today that she has an acute pulmonary problem. However, I do question ongoing upper respiratory tract infection with sinusitis and postnasal drip causing hoarseness.  The audible wheeze is only coming from her upper airway and specifically her vocal cords.  Fortunately she has a tracheostomy tube so she can rest her vocal cords by breathing through the tube this week.  Plan: -Switch antibiotics to Cipro as the only organism we have ever isolated from her to Pseudomonas -Slow prednisone taper over 7 more days -Voice rest, breathing through trach is encouraged, suppressing cough is  encouraged    Updated Medication List Outpatient Encounter Prescriptions as of 06/18/2013  Medication Sig  . albuterol (PROVENTIL HFA;VENTOLIN HFA) 108 (90 BASE) MCG/ACT inhaler Inhale 1 puff into the lungs every 6 (six) hours as needed for wheezing or shortness of breath.  [name] albuterol (PROVENTIL) (2.5 MG/3ML) 0.083% nebulizer solution Take 3 mLs (2.5 mg total) by nebulization every 6 (six) hours as needed for wheezing or shortness of breath.  . ALPRAZolam (XANAX) 0.25 MG tablet Take 0.125 mg by mouth 2 (two) times daily as needed for anxiety.  . cetirizine (ZYRTEC) 10 MG tablet Take 10 mg by mouth daily.  . chlorpheniramine-HYDROcodone (TUSSIONEX) 10-8 MG/5ML LQCR Take 5 mLs by mouth every 12 (twelve) hours as needed for cough.  . clopidogrel (PLAVIX) 75 MG tablet Take 75 mg by mouth every morning.   . Cyanocobalamin (VITAMIN B-12 IJ) Inject as directed every 30 (thirty) days.  [name] estrogens, conjugated, (PREMARIN) 0.625 MG tablet Take 0.625 mg by mouth every evening.  . furosemide (LASIX) 20 MG tablet Take 20 mg by mouth every morning.  [name]  HYDROcodone-acetaminophen (NORCO/VICODIN) 5-325 MG per tablet Take 1 tablet by mouth every 6 (six) hours as needed for moderate pain.  . hydroxypropyl methylcellulose (ISOPTO TEARS) 2.5 % ophthalmic solution Place 1 drop into both eyes 3 (three) times daily as needed for dry eyes.  [name] ibuprofen (ADVIL,MOTRIN) 200 MG tablet Take 400-600 mg by mouth every 6 (six) hours as needed for mild pain or moderate pain.  . montelukast (SINGULAIR) 10 MG tablet Take 10 mg by mouth at bedtime.  . predniSONE (DELTASONE) 10 MG tablet Take 62m for 3 days, 523mfor 3 days, take 4064mor 3 days, take 11m53mr 3 days, take 20 mg for 3 days, take 10 mg for 3 days then STOP  . sodium chloride (OCEAN) 0.65 % SOLN nasal spray Place 1 spray into both nostrils as needed for congestion.  . traMADol (ULTRAM) 50 MG tablet Take 25 mg by mouth every 6 (six) hours as needed for moderate pain.   . trMarland Kitchenmethoprim-polymyxin b (POLYTRIM) ophthalmic solution Place 1 drop into both eyes every 6 (six) hours.  . ipMarland Kitchenatropium-albuterol (DUONEB) 0.5-2.5 (3) MG/3ML SOLN Take 3 mLs by nebulization every 4 (four) hours as needed. Dx 496

## 2013-06-18 NOTE — Patient Instructions (Addendum)
Stop augmentin Use cipro 500mg  by mouth daily for 7 days Starting tomorrow morning, this is the way I want you to take your prednisone: Prednisone 20mg  daily for 4 days, then Prednisone 10mg  daily for 3 days, then stop  You will have extra prednisone at home, just save this but I only want you to take it as written above  Remove the trach cap for a few days, during this time don't cough, laugh, sing or talk.  Use the Tessalon and Tussionex to help suppress your cough.  Go get a chest X-ray at the [facility] office  We will see you back in 6 weeks or sooner if needed

## 2013-06-18 NOTE — Assessment & Plan Note (Signed)
I believe this problem is resolving. She still has a lot of upper airway wheezing and hoarseness with sinus congestion which has persisted despite antibiotics and prednisone.  Interestingly, today her wheezing nearly completely resolved when I uncapped her tracheostomy. This tells me that the vast majority of her wheezing from the current illness comes from an upper airway problem and is not to do to asthma, COPD, or lower respiratory tract infections.

## 2013-06-19 ENCOUNTER — Encounter: Payer: Self-pay | Admitting: Pulmonary Disease

## 2013-06-19 ENCOUNTER — Telehealth: Payer: Self-pay

## 2013-06-19 NOTE — Telephone Encounter (Signed)
Pt aware of cxr results.  Nothing further needed. 

## 2013-06-19 NOTE — Telephone Encounter (Signed)
Message copied by Len Blalock on Thu Jun 19, 2013  5:08 PM ------      Message from: Simonne Maffucci B      Created: Thu Jun 19, 2013  9:48 AM       A,            Please let her know that her CXR was OK            Thanks      B ------

## 2013-06-23 ENCOUNTER — Encounter: Payer: Self-pay | Admitting: Internal Medicine

## 2013-06-23 ENCOUNTER — Encounter: Payer: Self-pay | Admitting: Pulmonary Disease

## 2013-06-23 NOTE — Telephone Encounter (Signed)
I spoke with the pt and she states she took Cipro x 3 days and had a reaction to it just like she had to levaquin. She states she had a lot of joint aching and swelling. She stopped the medication and this improved. Pt states she is still having a lot of sinus congestion and some wheezing. At last OV BQ advised her to stop Augmentin and start Cipro so she has the rest of this course left at home. Pt wants to know should she finish this course? Does she need another abx? Please advise. Odessa Bing, CMA Allergies  Allergen Reactions  . Shellfish Allergy Anaphylaxis  . Asa [Aspirin]   . Avelox [Moxifloxacin]     Increased heart rate  . Eggs Or Egg-Derived Products     GI Upset  . Influenza Vaccines   . Levaquin [Levofloxacin In D5w]   . Septra [Sulfamethoxazole-Tmp Ds]     Mouth sores

## 2013-06-23 NOTE — Telephone Encounter (Signed)
Scheduled for 07/29/13 8:30 at [facility] with MAC.

## 2013-06-26 ENCOUNTER — Ambulatory Visit: Payer: Medicare Other | Admitting: Neurology

## 2013-06-30 ENCOUNTER — Ambulatory Visit: Payer: Medicare Other | Admitting: Internal Medicine

## 2013-07-01 ENCOUNTER — Ambulatory Visit (INDEPENDENT_AMBULATORY_CARE_PROVIDER_SITE_OTHER): Payer: Medicare Other | Admitting: Internal Medicine

## 2013-07-01 ENCOUNTER — Encounter: Payer: Self-pay | Admitting: Internal Medicine

## 2013-07-01 VITALS — BP 94/60 | HR 86 | Temp 97.9°F | Wt 109.0 lb

## 2013-07-01 DIAGNOSIS — R609 Edema, unspecified: Secondary | ICD-10-CM

## 2013-07-01 DIAGNOSIS — J690 Pneumonitis due to inhalation of food and vomit: Secondary | ICD-10-CM

## 2013-07-01 DIAGNOSIS — E538 Deficiency of other specified B group vitamins: Secondary | ICD-10-CM

## 2013-07-01 DIAGNOSIS — T17908A Unspecified foreign body in respiratory tract, part unspecified causing other injury, initial encounter: Secondary | ICD-10-CM

## 2013-07-01 LAB — COMPREHENSIVE METABOLIC PANEL
ALT: 14 U/L (ref 0–35)
AST: 21 U/L (ref 0–37)
Albumin: 3.6 g/dL (ref 3.5–5.2)
Alkaline Phosphatase: 79 U/L (ref 39–117)
BUN: 11 mg/dL (ref 6–23)
CO2: 31 mEq/L (ref 19–32)
Calcium: 9.1 mg/dL (ref 8.4–10.5)
Chloride: 101 mEq/L (ref 96–112)
Creatinine, Ser: 0.9 mg/dL (ref 0.4–1.2)
GFR: 66.19 mL/min (ref >60.00)
Glucose, Bld: 80 mg/dL (ref 70–99)
Potassium: 3.4 mEq/L — ABNORMAL LOW (ref 3.5–5.1)
Sodium: 140 mEq/L (ref 135–145)
Total Bilirubin: 0.6 mg/dL (ref 0.3–1.2)
Total Protein: 6.1 g/dL (ref 6.0–8.3)

## 2013-07-01 MED ORDER — CYANOCOBALAMIN 1000 MCG/ML IJ SOLN
1000.0000 ug | Freq: Once | INTRAMUSCULAR | Status: AC
  Administered 2013-07-01: 1000 ug via INTRAMUSCULAR

## 2013-07-01 NOTE — Progress Notes (Signed)
Pre visit review using our clinic review tool, if applicable. No additional management support is needed unless otherwise documented below in the visit note. 

## 2013-07-01 NOTE — Assessment & Plan Note (Addendum)
Edema recently noted in BLE which pt attributes to allergy to cipro. More likely related to chronic protein malnutrition. Will check CMP with labs today. Encouraged her to use compression hose. Addressing protein malnutrition with upcoming endoscopy with botox injections to help with esophageal dysmotility.

## 2013-07-01 NOTE — Assessment & Plan Note (Deleted)
Persistent symptoms of aspiration. Encouraged her to follow through with endoscopy and botox injections as scheduled. 

## 2013-07-01 NOTE — Assessment & Plan Note (Signed)
Persistent symptoms of aspiration. Encouraged her to follow through with endoscopy and botox injections as scheduled.

## 2013-07-01 NOTE — Progress Notes (Signed)
Subjective:    Patient ID: Anne [name], female    DOB: [birth year], 63 y.o.   MRN: [id number]  HPI 63YO female presents for follow up.  Recently started on Prednisone and Cipro by Dr. Lake Bells. Developed swelling in lower legs with Cipro. Stopped this medication. Completed Prednisone. Continues to have some dyspnea after eating, however overall doing well.  Has not yet had endoscopy but this is rescheduled for May 5th.    Review of Systems  Constitutional: Negative for fever, chills, appetite change, fatigue and unexpected weight change.  HENT: Negative for congestion, ear pain, sinus pressure, sore throat, trouble swallowing and voice change.   Eyes: Negative for visual disturbance.  Respiratory: Positive for cough (after eating) and shortness of breath. Negative for wheezing and stridor.   Cardiovascular: Positive for leg swelling. Negative for chest pain and palpitations.  Gastrointestinal: Negative for nausea, vomiting, abdominal pain, diarrhea, constipation, blood in stool, abdominal distention and anal bleeding.  Genitourinary: Negative for dysuria and flank pain.  Musculoskeletal: Negative for arthralgias, gait problem, myalgias and neck pain.  Skin: Negative for color change and rash.  Neurological: Negative for dizziness and headaches.  Hematological: Negative for adenopathy. Does not bruise/bleed easily.  Psychiatric/Behavioral: Negative for suicidal ideas, sleep disturbance and dysphoric mood. The patient is not nervous/anxious.        Objective:    BP 94/60  Pulse 86  Temp(Src) 97.9 F (36.6 C) (Oral)  Wt 109 lb (49.442 kg)  SpO2 98% Physical Exam  Constitutional: She is oriented to person, place, and time. She appears well-developed and well-nourished. No distress.  HENT:  Head: Normocephalic and atraumatic.  Right Ear: External ear normal.  Left Ear: External ear normal.  Nose: Nose normal.  Mouth/Throat: Oropharynx is clear and moist. No oropharyngeal  exudate.  Eyes: Conjunctivae are normal. Pupils are equal, round, and reactive to light. Right eye exhibits no discharge. Left eye exhibits no discharge. No scleral icterus.  Neck: Normal range of motion. Neck supple. No tracheal deviation present. No thyromegaly present.  Cardiovascular: Normal rate, regular rhythm, normal heart sounds and intact distal pulses.  Exam reveals no gallop and no friction rub.   No murmur heard. Pulmonary/Chest: Effort normal. No accessory muscle usage. Not tachypneic. No respiratory distress. She has decreased breath sounds. She has no wheezes. She has no rhonchi. She has no rales. She exhibits no tenderness.  Musculoskeletal: Normal range of motion. She exhibits edema (trace bilateral LE to mid shin). She exhibits no tenderness.  Lymphadenopathy:    She has no cervical adenopathy.  Neurological: She is alert and oriented to person, place, and time. No cranial nerve deficit. She exhibits normal muscle tone. Coordination normal.  Skin: Skin is warm and dry. No rash noted. She is not diaphoretic. No erythema. No pallor.  Psychiatric: She has a normal mood and affect. Her behavior is normal. Judgment and thought content normal.          Assessment & Plan:   Problem List Items Addressed This Visit   Aspiration pneumonia   Chronic pulmonary aspiration     Persistent symptoms of aspiration. Encouraged her to follow through with endoscopy and botox injections as scheduled.    Edema - Primary     Edema recently noted in BLE which pt attributes to allergy to cipro. More likely related to chronic protein malnutrition. Will check CMP with labs today. Encouraged her to use compression hose. Addressing protein malnutrition with upcoming endoscopy with botox injections to help with  esophageal dysmotility.    Relevant Medications      cyanocobalamin ((VITAMIN B-12)) injection 1,000 mcg (Completed)   Other Relevant Orders      Comp Met (CMET)    Other Visit Diagnoses     B12 deficiency            Return in about 3 months (around 09/30/2013).

## 2013-07-05 ENCOUNTER — Encounter: Payer: Self-pay | Admitting: Internal Medicine

## 2013-07-07 ENCOUNTER — Ambulatory Visit (INDEPENDENT_AMBULATORY_CARE_PROVIDER_SITE_OTHER): Payer: Medicare Other | Admitting: Adult Health

## 2013-07-07 ENCOUNTER — Encounter: Payer: Self-pay | Admitting: Adult Health

## 2013-07-07 VITALS — BP 102/60 | HR 76 | Temp 97.7°F | Resp 14 | Wt 108.0 lb

## 2013-07-07 DIAGNOSIS — J209 Acute bronchitis, unspecified: Secondary | ICD-10-CM

## 2013-07-07 MED ORDER — PREDNISONE 10 MG PO TABS: ORAL_TABLET | ORAL | Status: DC

## 2013-07-07 MED ORDER — AMOXICILLIN-POT CLAVULANATE 250-62.5 MG/5ML PO SUSR: ORAL | Status: DC

## 2013-07-07 NOTE — Progress Notes (Signed)
   Subjective:    Patient ID: Anne [name], female    DOB: 1950/11/11, 63 y.o.   MRN: [id number]  HPI  Patient is a pleasant 63 year old female who presents to clinic with sinus congestion, rhinorrhea, chest congestion, green mucus from chest and sinuses reported. Symptoms started on Thursday. She has been taking ibuprofen, Tussionex for cough. She has also been taking an antihistamine. Denies fevers although she reports feeling chills.  Past Medical History  Diagnosis Date  . Asthma   . COPD (chronic obstructive pulmonary disease)   . Stroke     slurred speech, drawn face, imaging normal, occurred twice, [facility]  . Migraine   . Allergic rhinitis   . Anemia   . IBS (irritable bowel syndrome)   . Shingles   . Tracheostomy in place   . Complication of anesthesia     Breathing problems  . Pulmonary fibrosis   . Vocal cord paresis       Review of Systems  Constitutional: Negative for fever and chills.  HENT: Positive for congestion, postnasal drip, rhinorrhea and sinus pressure. Negative for sore throat.   Respiratory: Positive for cough and wheezing.   All other systems reviewed and are negative.      Objective:   Physical Exam  Constitutional: She is oriented to person, place, and time. She appears well-developed and well-nourished. No distress.  HENT:  Head: Normocephalic and atraumatic.  Right Ear: External ear normal.  Left Ear: External ear normal.  Mouth/Throat: No oropharyngeal exudate.  Pharyngeal erythema  Eyes: Conjunctivae and EOM are normal.  Neck: Normal range of motion.  Cardiovascular: Normal rate, regular rhythm and normal heart sounds.  Exam reveals no gallop.   No murmur heard. Pulmonary/Chest: Effort normal. No respiratory distress. She has wheezes. She has no rales.  Lymphadenopathy:    She has no cervical adenopathy.  Neurological: She is alert and oriented to person, place, and time.  Psychiatric: She has a normal mood and affect. Her behavior  is normal. Judgment and thought content normal.       Assessment & Plan:   1. Acute bronchitis Start antibiotic and prednisone taper. RTC if no improvement in 3-4 days or sooner if necessary - amoxicillin-clavulanate (AUGMENTIN) 250-62.5 MG/5ML suspension; Take 10 ml (total of 500 mg) every 8 hours for 7 days  Dispense: 210 mL; Refill: 0

## 2013-07-07 NOTE — Progress Notes (Signed)
Pre visit review using our clinic review tool, if applicable. No additional management support is needed unless otherwise documented below in the visit note. 

## 2013-07-10 ENCOUNTER — Encounter (HOSPITAL_COMMUNITY): Payer: Self-pay | Admitting: *Deleted

## 2013-07-10 ENCOUNTER — Other Ambulatory Visit: Payer: Self-pay | Admitting: *Deleted

## 2013-07-10 ENCOUNTER — Encounter (HOSPITAL_COMMUNITY): Payer: Self-pay | Admitting: Pharmacy Technician

## 2013-07-10 MED ORDER — CETIRIZINE HCL 10 MG PO TABS: 10.0000 mg | ORAL_TABLET | Freq: Every day | ORAL | Status: DC

## 2013-07-10 MED ORDER — CLOPIDOGREL BISULFATE 75 MG PO TABS: 75.0000 mg | ORAL_TABLET | Freq: Every morning | ORAL | Status: DC

## 2013-07-24 ENCOUNTER — Encounter: Payer: Self-pay | Admitting: Internal Medicine

## 2013-07-25 ENCOUNTER — Other Ambulatory Visit: Payer: Self-pay | Admitting: Internal Medicine

## 2013-07-26 ENCOUNTER — Other Ambulatory Visit: Payer: Self-pay | Admitting: Internal Medicine

## 2013-07-28 ENCOUNTER — Telehealth: Payer: Self-pay | Admitting: Neurology

## 2013-07-28 NOTE — Telephone Encounter (Signed)
Pt called requesting to speak to a nurse regarding her diagnosis code. Please call pt.

## 2013-07-28 NOTE — Telephone Encounter (Signed)
She had labs done that weren't covered by Medicare.  She asked if we could change the code for diabetic testing so they will be covered.  Not sure how or if we can do this.  Please advise.  Thanks.

## 2013-07-28 NOTE — Telephone Encounter (Signed)
Will you call her please??

## 2013-07-29 ENCOUNTER — Encounter (HOSPITAL_COMMUNITY): Payer: Self-pay | Admitting: *Deleted

## 2013-07-29 ENCOUNTER — Ambulatory Visit (HOSPITAL_COMMUNITY): Payer: Medicare Other | Admitting: Anesthesiology

## 2013-07-29 ENCOUNTER — Ambulatory Visit (HOSPITAL_COMMUNITY)
Admission: RE | Admit: 2013-07-29 | Discharge: 2013-07-29 | Disposition: A | Discharge status: Home / Self Care | Payer: Medicare Other | Source: Ambulatory Visit | Attending: Internal Medicine | Admitting: Internal Medicine

## 2013-07-29 ENCOUNTER — Encounter (HOSPITAL_COMMUNITY): Payer: Medicare Other | Admitting: Anesthesiology

## 2013-07-29 ENCOUNTER — Encounter (HOSPITAL_COMMUNITY)
Admission: RE | Disposition: A | Discharge status: Home / Self Care | Payer: Self-pay | Source: Ambulatory Visit | Attending: Internal Medicine

## 2013-07-29 DIAGNOSIS — R1310 Dysphagia, unspecified: Secondary | ICD-10-CM | POA: Insufficient documentation

## 2013-07-29 DIAGNOSIS — K224 Dyskinesia of esophagus: Secondary | ICD-10-CM

## 2013-07-29 DIAGNOSIS — G43909 Migraine, unspecified, not intractable, without status migrainosus: Secondary | ICD-10-CM | POA: Insufficient documentation

## 2013-07-29 DIAGNOSIS — D649 Anemia, unspecified: Secondary | ICD-10-CM | POA: Insufficient documentation

## 2013-07-29 DIAGNOSIS — J8410 Pulmonary fibrosis, unspecified: Secondary | ICD-10-CM | POA: Insufficient documentation

## 2013-07-29 DIAGNOSIS — I10 Essential (primary) hypertension: Secondary | ICD-10-CM | POA: Insufficient documentation

## 2013-07-29 DIAGNOSIS — J449 Chronic obstructive pulmonary disease, unspecified: Secondary | ICD-10-CM | POA: Insufficient documentation

## 2013-07-29 DIAGNOSIS — J4489 Other specified chronic obstructive pulmonary disease: Secondary | ICD-10-CM | POA: Insufficient documentation

## 2013-07-29 DIAGNOSIS — Z8701 Personal history of pneumonia (recurrent): Secondary | ICD-10-CM | POA: Insufficient documentation

## 2013-07-29 DIAGNOSIS — K589 Irritable bowel syndrome without diarrhea: Secondary | ICD-10-CM | POA: Insufficient documentation

## 2013-07-29 DIAGNOSIS — Z91012 Allergy to eggs: Secondary | ICD-10-CM | POA: Insufficient documentation

## 2013-07-29 DIAGNOSIS — K220 Achalasia of cardia: Secondary | ICD-10-CM

## 2013-07-29 DIAGNOSIS — Z930 Tracheostomy status: Secondary | ICD-10-CM

## 2013-07-29 DIAGNOSIS — Z8673 Personal history of transient ischemic attack (TIA), and cerebral infarction without residual deficits: Secondary | ICD-10-CM | POA: Insufficient documentation

## 2013-07-29 DIAGNOSIS — Z91013 Allergy to seafood: Secondary | ICD-10-CM | POA: Insufficient documentation

## 2013-07-29 DIAGNOSIS — Z881 Allergy status to other antibiotic agents status: Secondary | ICD-10-CM | POA: Insufficient documentation

## 2013-07-29 DIAGNOSIS — Z887 Allergy status to serum and vaccine status: Secondary | ICD-10-CM | POA: Insufficient documentation

## 2013-07-29 DIAGNOSIS — Z882 Allergy status to sulfonamides status: Secondary | ICD-10-CM | POA: Insufficient documentation

## 2013-07-29 DIAGNOSIS — Z883 Allergy status to other anti-infective agents status: Secondary | ICD-10-CM | POA: Insufficient documentation

## 2013-07-29 HISTORY — PX: ESOPHAGOGASTRODUODENOSCOPY (EGD) WITH PROPOFOL: SHX5813

## 2013-07-29 HISTORY — PX: BOTOX INJECTION: SHX5754

## 2013-07-29 SURGERY — ESOPHAGOGASTRODUODENOSCOPY (EGD) WITH PROPOFOL
Anesthesia: Monitor Anesthesia Care

## 2013-07-29 MED ORDER — PROPOFOL 10 MG/ML IV BOLUS
INTRAVENOUS | Status: DC | PRN
  Administered 2013-07-29: 30 ug via INTRAVENOUS
  Administered 2013-07-29 (×2): 20 ug via INTRAVENOUS

## 2013-07-29 MED ORDER — SODIUM CHLORIDE 0.9 % IJ SOLN
INTRAMUSCULAR | Status: AC
  Filled 2013-07-29: qty 10

## 2013-07-29 MED ORDER — PROPOFOL INFUSION 10 MG/ML OPTIME
INTRAVENOUS | Status: DC | PRN
  Administered 2013-07-29: 100 ug/kg/min via INTRAVENOUS

## 2013-07-29 MED ORDER — SODIUM CHLORIDE 0.9 % IJ SOLN
INTRAMUSCULAR | Status: DC | PRN
  Administered 2013-07-29: 09:00:00 via SUBMUCOSAL

## 2013-07-29 MED ORDER — MIDAZOLAM HCL 5 MG/5ML IJ SOLN
INTRAMUSCULAR | Status: DC | PRN
  Administered 2013-07-29 (×2): 1 mg via INTRAVENOUS

## 2013-07-29 MED ORDER — KETAMINE HCL 10 MG/ML IJ SOLN
INTRAMUSCULAR | Status: AC
  Filled 2013-07-29: qty 1

## 2013-07-29 MED ORDER — ATROPINE SULFATE 0.4 MG/ML IJ SOLN
INTRAMUSCULAR | Status: AC
  Filled 2013-07-29: qty 1

## 2013-07-29 MED ORDER — LACTATED RINGERS IV SOLN
INTRAVENOUS | Status: DC
  Administered 2013-07-29: 1000 mL via INTRAVENOUS

## 2013-07-29 MED ORDER — SODIUM CHLORIDE 0.9 % IJ SOLN
100.0000 [IU] | Freq: Once | INTRAMUSCULAR | Status: DC
  Filled 2013-07-29: qty 100

## 2013-07-29 MED ORDER — SODIUM CHLORIDE 0.9 % IV SOLN: INTRAVENOUS | Status: DC

## 2013-07-29 MED ORDER — EPHEDRINE SULFATE 50 MG/ML IJ SOLN
INTRAMUSCULAR | Status: AC
  Filled 2013-07-29: qty 1

## 2013-07-29 MED ORDER — LIDOCAINE HCL (PF) 2 % IJ SOLN
INTRAMUSCULAR | Status: DC | PRN
  Administered 2013-07-29: 100 mg via INTRADERMAL

## 2013-07-29 MED ORDER — KETAMINE HCL 10 MG/ML IJ SOLN
INTRAMUSCULAR | Status: DC | PRN
  Administered 2013-07-29: 10 mg via INTRAVENOUS
  Administered 2013-07-29: 20 mg via INTRAVENOUS
  Administered 2013-07-29: 10 mg via INTRAVENOUS

## 2013-07-29 MED ORDER — PROPOFOL 10 MG/ML IV BOLUS
INTRAVENOUS | Status: AC
  Filled 2013-07-29: qty 20

## 2013-07-29 MED ORDER — LIDOCAINE HCL (CARDIAC) 20 MG/ML IV SOLN
INTRAVENOUS | Status: AC
  Filled 2013-07-29: qty 5

## 2013-07-29 MED ORDER — MIDAZOLAM HCL 2 MG/2ML IJ SOLN
INTRAMUSCULAR | Status: AC
  Filled 2013-07-29: qty 2

## 2013-07-29 SURGICAL SUPPLY — 14 items

## 2013-07-29 NOTE — Anesthesia Preprocedure Evaluation (Addendum)
Anesthesia Evaluation  Patient identified by MRN, date of birth, ID band Patient awake    Reviewed: Allergy & Precautions, H&P , NPO status , Patient's Chart, lab work & pertinent test results  History of Anesthesia Complications Negative for: history of anesthetic complications  Airway Mallampati: II TM Distance: >3 FB Neck ROM: Full    Dental  (+) Dental Advisory Given   Pulmonary asthma , pneumonia -, resolved, COPD COPD inhaler,  Pulmonary fibrosis breath sounds clear to auscultation        Cardiovascular negative cardio ROS  Rhythm:Regular Rate:Normal     Neuro/Psych  Headaches, CVA, No Residual Symptoms negative psych ROS   GI/Hepatic negative GI ROS, Neg liver ROS,   Endo/Other  negative endocrine ROS  Renal/GU negative Renal ROS     Musculoskeletal negative musculoskeletal ROS (+)   Abdominal   Peds  Hematology  (+) anemia ,   Anesthesia Other Findings   Reproductive/Obstetrics negative OB ROS                          Anesthesia Physical Anesthesia Plan  ASA: III  Anesthesia Plan: MAC   Post-op Pain Management:    Induction: Intravenous  Airway Management Planned:   Additional Equipment:   Intra-op Plan:   Post-operative Plan:   Informed Consent: I have reviewed the patients History and Physical, chart, labs and discussed the procedure including the risks, benefits and alternatives for the proposed anesthesia with the patient or authorized representative who has indicated his/her understanding and acceptance.   Dental advisory given  Plan Discussed with: CRNA  Anesthesia Plan Comments:         Anesthesia Quick Evaluation

## 2013-07-29 NOTE — Op Note (Addendum)
[facility] [place], [address]   ENDOSCOPY PROCEDURE REPORT  PATIENT: Anne [name], Anne [name]  MR#: [id number] BIRTHDATE: 10-23-1950 , 24  yrs. old GENDER: Female ENDOSCOPIST: Jerene Bears, MD PROCEDURE DATE:  07/29/2013 PROCEDURE:  EGD w/ directed submucosal injection(s), any substance (Botox to LES) ASA CLASS:     Class III INDICATIONS:  Dysphagia.    Esophageal dysmotility, hypertensive LES, possible achalasia variant MEDICATIONS: MAC sedation, administered by CRNA and See Anesthesia Report. TOPICAL ANESTHETIC: none  DESCRIPTION OF PROCEDURE: After the risks benefits and alternatives of the procedure were thoroughly explained, informed consent was obtained.  The Pentax Gastroscope [id number] endoscope was introduced through the mouth and advanced to the second portion of the duodenum. Without limitations.  The instrument was slowly withdrawn as the mucosa was fully examined.     ESOPHAGUS: The mucosa of the esophagus appeared normal.   A normal Z-line was observed 37 cm from the incisors, no stricture was seen. Given history of elevated LES residual pressure, 4 quadrant, 25 unit per 1 mL (4 ml total) botulinum toxin injection was given 1 cm above the Z line to deliver muscle relaxant.  There was no blood loss from maneuver.  STOMACH: The mucosa of the stomach appeared normal.  There was evidence of prior gastrostomy tube placement on the lesser curve  DUODENUM: The duodenal mucosa showed no abnormalities in the bulb and second portion of the duodenum.  Retroflexed views revealed a very small hiatal hernia.     The scope was then withdrawn from the patient and the procedure completed.  COMPLICATIONS: There were no complications.  ENDOSCOPIC IMPRESSION: 1.   The mucosa of the esophagus appeared normal 2.   Normal Z-line was observed 37 cm from the incisors; Botox injection was given to deliver muscle relaxant at the LES 3.   The mucosa of the  stomach appeared normal, former PEG site visible 4.   The duodenal mucosa showed no abnormalities in the bulb and second portion of the duodenum  RECOMMENDATIONS: 1.  Liquid diet until dinner tonight, then can advance to soft solids 2.  Office follow-up next available 3.  Standard reflux precautions   eSigned:  Jerene Bears, MD 07/29/2013 9:30 AM   CC:The Patient;  PATIENT NAME:  Arlean, Thies MR#: [id number]

## 2013-07-29 NOTE — H&P (Signed)
HPI: [name] is a 63 year old female with a past medical history of asthma with pulmonary fibrosis status post permanent tracheostomy, history of stroke, migraines, chronic aspiration, history of dysphagia and migraines who is here today for endoscopy. 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 Her residual esophageal sphincter pressures were elevated. She has continued to have trouble swallowing and often will regurgitate undigested food. She has continued to have issues with aspiration and has been treated by Dr. Lake Bells for aspiration pneumonia on several occasions. He is discussed with her putting in another J-tube for feeding to prevent aspiration, but she was to avoid this if at all possible. She has had 2 J-tubes in the past  Today she denies dyspnea or chest pain. No abdominal pain. No recent nausea vomiting.  Past Medical History  Diagnosis Date  . Asthma   . COPD (chronic obstructive pulmonary disease)   . Stroke     slurred speech, drawn face, imaging normal, occurred twice, [facility]  . Migraine   . Allergic rhinitis   . Anemia   . IBS (irritable bowel syndrome)   . Shingles   . Tracheostomy in place   . Complication of anesthesia     Breathing problems  . Pulmonary fibrosis   . Vocal cord paresis     Past Surgical History  Procedure Laterality Date  . Abdominal hysterectomy    . Tubal ligation    . Tracheostomy  1996    done at [facility], Dr. Kathyrn Sheriff  . Video bronchoscopy Bilateral 11/20/2012    Procedure: VIDEO BRONCHOSCOPY WITH FLUORO;  Surgeon: Juanito Doom, MD;  Location: [facility];  Service: Cardiopulmonary;  Laterality: Bilateral;  . Jejunostomy feeding tube      x2 both failed  . Esophageal manometry N/A 12/16/2012    Procedure: ESOPHAGEAL MANOMETRY (EM);  Surgeon: Jerene Bears, MD;  Location: [facility];  Service: Gastroenterology;  Laterality: N/A;  . Eye surgery      Catarct surgery 2014  . Eye surgery as child       Current Facility-Administered Medications  Medication Dose Route Frequency Provider Last Rate Last Dose  . 0.9 %  sodium chloride infusion   Intravenous Continuous Jerene Bears, MD      . botox 100 units/NS 4 mL for final conc. 25 units/mL mixture  100 Units Submucosal Once Jerene Bears, MD      . lactated ringers infusion   Intravenous Continuous Jerene Bears, MD 125 mL/hr at 07/29/13 0810 1,000 mL at 07/29/13 0810    Allergies  Allergen Reactions  . Shellfish Allergy Anaphylaxis  . Quinolones     Feet swell  . Asa [Aspirin]     Tongue swelling  . Avelox [Moxifloxacin]     Increased heart rate  . Ciprofloxacin Swelling    ALL OF THE "FLOXACIN"  . Eggs Or Egg-Derived Products     GI Upset  . Influenza Vaccines   . Levaquin [Levofloxacin In D5w]     Aches and swelling  . Septra [Sulfamethoxazole-Tmp Ds]     Mouth sores    Family History  Problem Relation Age of Onset  . Asthma Cousin   . COPD Cousin   . Breast cancer Maternal Grandmother   . Cancer Maternal Grandmother     breast  . Breast cancer Maternal Aunt   . Cancer Maternal Aunt     breast  . Asthma Father   . Cancer Father     lung  .  Cancer Paternal Uncle     lung  . COPD Paternal Grandfather     History  Substance Use Topics  . Smoking status: Never Smoker   . Smokeless tobacco: Never Used  . Alcohol Use: No    ROS: As per history of present illness, otherwise negative  BP 115/39  Temp(Src) 97.8 F (36.6 C) (Oral)  Resp 12  SpO2 100% Gen: awake, alert, NAD HEENT: anicteric, op clear, trach in place CV: RRR Pulm: CTA but coarse bilaterally Abd: soft, NT/ND, +BS throughout Ext: no c/c/e Neuro: nonfocal   RELEVANT LABS AND IMAGING: CBC    Component Value Date/Time   WBC 6.3 10/22/2012 1443   RBC 3.75* 10/22/2012 1443   HGB 12.1 10/22/2012 1443   HCT 36.4 10/22/2012 1443   PLT 210.0 10/22/2012 1443   MCV 97.1 10/22/2012 1443   MCHC 33.2 10/22/2012 1443   RDW 13.3 10/22/2012 1443    LYMPHSABS 1.3 10/22/2012 1443   MONOABS 0.5 10/22/2012 1443   EOSABS 0.2 10/22/2012 1443   BASOSABS 0.0 10/22/2012 1443    CMP     Component Value Date/Time   NA 140 07/01/2013 0942   K 3.4* 07/01/2013 0942   CL 101 07/01/2013 0942   CO2 31 07/01/2013 0942   GLUCOSE 80 07/01/2013 0942   BUN 11 07/01/2013 0942   CREATININE 0.9 07/01/2013 0942   CALCIUM 9.1 07/01/2013 0942   PROT 6.1 07/01/2013 0942   ALBUMIN 3.6 07/01/2013 0942   AST 21 07/01/2013 0942   ALT 14 07/01/2013 0942   ALKPHOS 79 07/01/2013 0942   BILITOT 0.6 07/01/2013 0942    ASSESSMENT/PLAN: 63 year old female with a past medical history of asthma with pulmonary fibrosis status post permanent tracheostomy, history of stroke, migraines, chronic aspiration, history of dysphagia and migraines who is here today for endoscopy  1.  Dysphagia, esophageal dysmotility -- Anne [name] has a difficult problem and we have discussed how to help her dysphagia. She therefore has esophageal dysmotility and elevated residual LES pressure. I last manometry this did not meet absolute criteria for achalasia. She is hesitant to undergo any surgery such as Heller myotomy, which is easy to understand. She is here today for EGD with probable Botox injection to the LES. Hopefully this will help her swallowing and esophageal clearance. It is possible that with better esophageal clearance she has less aspiration. We did discuss however directly that reflux which can lead to aspiration not improved with Botox injection to the LES, and might in fact worsen. She understands this. We discussed the procedure today at length including the risks and benefits and she is agreeable to proceed. We are proceeding with monitored anesthesia care.  She has followup with Dr. Lake Bells tomorrow

## 2013-07-29 NOTE — Discharge Instructions (Signed)

## 2013-07-29 NOTE — Transfer of Care (Signed)
Immediate Anesthesia Transfer of Care Note  Patient: Anne [name]  Procedure(s) Performed: Procedure(s) with comments: ESOPHAGOGASTRODUODENOSCOPY (EGD) WITH PROPOFOL (N/A) - with botox injection BOTOX INJECTION (N/A)  Patient Location: PACU and Endoscopy Unit  Anesthesia Type:MAC  Level of Consciousness: awake, alert  and oriented  Airway & Oxygen Therapy: Patient Spontanous Breathing, Patient connected to tracheostomy mask oxygen and regular oxygen mask over trach.  Post-op Assessment: Report given to PACU RN and Post -op Vital signs reviewed and stable  Post vital signs: Reviewed and stable  Complications: No apparent anesthesia complications

## 2013-07-29 NOTE — Anesthesia Postprocedure Evaluation (Signed)
Anesthesia Post Note  Patient: Anne [name]  Procedure(s) Performed: Procedure(s) (LRB): ESOPHAGOGASTRODUODENOSCOPY (EGD) WITH PROPOFOL (N/A) BOTOX INJECTION (N/A)  Anesthesia type: MAC  Patient location: PACU  Post pain: Pain level controlled  Post assessment: Post-op Vital signs reviewed  Last Vitals: BP 115/39  Pulse 79  Temp(Src) 36.3 C (Oral)  Resp 14  Ht 5\' 3"  (1.6 m)  Wt 108 lb 0.4 oz (49 kg)  BMI 19.14 kg/m2  SpO2 100%  Post vital signs: Reviewed  Level of consciousness: awake  Complications: No apparent anesthesia complications

## 2013-07-30 ENCOUNTER — Encounter: Payer: Self-pay | Admitting: Pulmonary Disease

## 2013-07-30 ENCOUNTER — Ambulatory Visit (INDEPENDENT_AMBULATORY_CARE_PROVIDER_SITE_OTHER): Payer: Medicare Other | Admitting: Pulmonary Disease

## 2013-07-30 ENCOUNTER — Encounter (HOSPITAL_COMMUNITY): Payer: Self-pay | Admitting: Internal Medicine

## 2013-07-30 VITALS — BP 108/60 | HR 67 | Ht 63.0 in | Wt 107.0 lb

## 2013-07-30 DIAGNOSIS — J449 Chronic obstructive pulmonary disease, unspecified: Secondary | ICD-10-CM

## 2013-07-30 DIAGNOSIS — J690 Pneumonitis due to inhalation of food and vomit: Secondary | ICD-10-CM

## 2013-07-30 DIAGNOSIS — J8410 Pulmonary fibrosis, unspecified: Secondary | ICD-10-CM

## 2013-07-30 NOTE — Assessment & Plan Note (Signed)
Now that she has undergone Botox injections of her lower esophageal sphincter, I want to give her several months to see if she will have improvement in recurrent aspiration.  If she has recurrent aspiration pneumonia (documented on chest imaging) then we would need to proceed with a J-tube. I prefer to give her 2-3 months after the Botox injections however before we consider doing this.  Plan: -Followup with me in 3 months -If she develops a respiratory infection she needs a chest x-ray

## 2013-07-30 NOTE — Progress Notes (Signed)
Subjective:    Patient ID: Anne [name], female    DOB: Nov 17, 1950, 63 y.o.   MRN: [id number]  Synopsis: She enjoys establish care with the [facility] pulmonary clinic in may of 2014. 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 A CT scan in 2013 performed at [facility] showed some groundglass changes, interstitial thickening, and interlobular thickening in the upper lobes predominantly.  This had progressed since 2011.  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  She was also evaluated by [facility] in 2014 and was found to have significant esophageal dysmotility and increased LES tone.  She has had a J tube on two separate occasions in the past.   On 07/29/2013 she had lower esophageal Botox injections.  HPI     07/30/2013 ROV> Yesterday she had her endoscopy during which time she had botox injected into her LES.  She hasn't had a full meal yet to see if is working yet.  She has been travelling a lot lately and has been having a lot of joint pain all over and feel like she has the flu.  She has red spots that show up on her face at times.  Prior to the LES procedure she had some nausea and vomiting but this has not recurred. Shortness of breath is been about the same. Minimal wheezing.  Past Medical History  Diagnosis Date  . Asthma   . COPD (chronic obstructive pulmonary disease)   . Stroke     slurred speech, drawn face, imaging normal, occurred twice, [facility]   . Migraine   . Allergic rhinitis   . Anemia   . IBS (irritable bowel syndrome)   . Shingles   . Tracheostomy in place   . Complication of anesthesia     Breathing problems  . Pulmonary fibrosis   . Vocal cord paresis      Review of Systems  Constitutional: Negative for fever, chills and fatigue.  HENT: Negative for congestion, postnasal drip, rhinorrhea, sinus pressure and sore throat.   Respiratory: Positive for shortness of breath. Negative for cough and chest tightness.   Cardiovascular: Negative for chest pain, palpitations and leg swelling.       Objective:   Physical Exam  Filed Vitals:   07/30/13 0950  BP: 108/60  Pulse: 67  Height: 5\' 3"  (1.6 m)  Weight: 114 lb (51.71 kg)  SpO2: 98%  RA  06/18/2013 Ambulated 500 feet on RA and O2 saturation never dropped less than 97%  Gen:  Well appearing, hoarse voice HEENT: NCAT, EOMi, OP clear,  PULM: Mild upper airway wheezing completely resolved after removing tracheostomy  CV: RRR, no mgr, no JVD AB: BS+, soft, nontender, no hsm Ext: warm, no edema, no clubbing, no cyanosis   04/2011 CT chest at [facility]>> emphysema bilaterally; upper lobe groundglass opacification, interstitial thickening, interlobular thickening, and apical cappingnoted bilaterally. This is significantly worsened since the 2007 comparison study. 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  If she has recurrent aspiration pneumonia (documented on chest imaging) then we would need to proceed with a J-tube. I prefer to give her 2-3 months after the Botox injections however before we consider doing this.  Plan: -Followup with me in 3 months -If she develops a respiratory infection she needs a chest x-ray  Postinflammatory pulmonary fibrosis No clear evidence of progression.  If she ends up requiring a J-tube as noted above, we will see if she develops changes and infiltrates. If she does develop new chest imaging abnormalities (consolidation, groundglass etc.) then we would consider an open lung biopsy. I don't for see this happening in 2015.  COPD with asthma This is been a relatively stable interval.  We have never been able to document presence of either of these.  It should be noted that can make her wheezing go away very easily in clinic by removing her tracheostomy. This tells me that her wheezing is due to vocal cord problems and less likely due to an airway problem.  In the future when she develops respiratory infections we need to recognize that with her tracheostomy she's not had a normal host defense and so she'll be at increased risk for minor upper airway respiratory infections. It will be important to distinguish this from aspiration pneumonia.  Plan: -When she gets flares of bronchitis or upper respiratory infections get a chest x-ray -Continue duo neb  - From my standpoint no clear indication to continue long- acting bronchodilators (and pressure she gets benefit from these).    Updated Medication List Outpatient Encounter Prescriptions as of 07/30/2013   Medication Sig  . albuterol (PROVENTIL HFA;VENTOLIN HFA) 108 (90 BASE) MCG/ACT inhaler Inhale 1 puff into the lungs every 6 (six) hours as needed for wheezing or shortness of breath.  . ALPRAZolam (XANAX) 0.25 MG tablet Take 0.125-0.25 mg by mouth 2 (two) times daily as needed for anxiety. Takes 1/2 in the morning and 1 at night  . benzonatate (TESSALON) 100 MG capsule Take 1 capsule (100 mg total) by mouth every 6 (six) hours as needed for cough.  . cetirizine (ZYRTEC) 10 MG tablet Take 1 tablet (10 mg total) by mouth daily.  . chlorpheniramine-HYDROcodone (TUSSIONEX) 10-8 MG/5ML LQCR Take 5 mLs by mouth every 12 (twelve) hours as needed for cough.  . clopidogrel (PLAVIX) 75 MG tablet Take 1 tablet (75 mg total) by mouth every morning.  . Cyanocobalamin (VITAMIN B-12 IJ) Inject as directed every 30 (thirty) days.  [name] estrogens, conjugated, (PREMARIN) 0.625 MG tablet Take 0.625 mg by mouth every evening.  . furosemide (LASIX) 20 MG tablet TAKE 1 TABLET BY MOUTH ONCE A DAY AS  NEEDED  . HYDROcodone-acetaminophen (NORCO/VICODIN) 5-325 MG per tablet Take 0.5 tablets by mouth every 6 (six) hours as needed for moderate pain.  . hydroxypropyl methylcellulose (ISOPTO TEARS) 2.5 % ophthalmic solution Place 1 drop into both eyes 3 (three) times daily as needed for dry eyes.  [name] ibuprofen (ADVIL,MOTRIN) 200 MG tablet Take 400-600 mg by mouth every 6 (six) hours as needed for mild pain or moderate pain.  [name] ipratropium-albuterol (DUONEB) 0.5-2.5 (3) MG/3ML SOLN Take 3 mLs by nebulization every 4 (four) hours as needed. Dx 496  . montelukast (SINGULAIR) 10 MG tablet TAKE 1 TABLET BY MOUTH EVERY DAY  . sodium chloride (OCEAN) 0.65 % SOLN nasal spray Place 1 spray into both nostrils as needed for congestion.  . [DISCONTINUED] amoxicillin-clavulanate (AUGMENTIN) 250-62.5 MG/5ML suspension Take 10 ml (total of 500 mg) every 8 hours for 7 days  . [DISCONTINUED] predniSONE (DELTASONE) 10 MG tablet Take 10 mg by mouth  daily with breakfast. 6 day pack started 07/07/2013

## 2013-07-30 NOTE — Patient Instructions (Signed)
Continue your medicines as you are doing Talk to Dr. Gilford Rile about the joint achest If and when you get sick with respiratory problems, make sure you get a chest x-ray We will see you back in 3-4 months or sooner if needed

## 2013-07-30 NOTE — Assessment & Plan Note (Signed)
No clear evidence of progression.  If she ends up requiring a J-tube as noted above, we will see if she develops changes and infiltrates. If she does develop new chest imaging abnormalities (consolidation, groundglass etc.) then we would consider an open lung biopsy. I don't for see this happening in 2015.

## 2013-07-30 NOTE — Assessment & Plan Note (Addendum)
" >>  ASSESSMENT AND PLAN FOR ASTHMA-COPD OVERLAP SYNDROME (HCC) WRITTEN ON 07/30/2013  1:44 PM BY Jailah Willis B, MD  This is been a relatively stable interval.  We have never been able to document presence of either of these.  It should be noted that can make her wheezing go away very easily in clinic by removing her tracheostomy. This tells me that her wheezing is due to vocal cord problems and less likely due to an airway problem.  In the future when she develops respiratory infections we need to recognize that with her tracheostomy she's not had a normal host defense and so she'll be at increased risk for minor upper airway respiratory infections. It will be important to distinguish this from aspiration pneumonia.  Plan: -When she gets flares of bronchitis or upper respiratory infections get a chest x-ray -Continue duo neb  - From my standpoint no clear indication to continue long- acting bronchodilators (and pressure she gets benefit from these).   >>ASSESSMENT AND PLAN FOR PULMONARY FIBROSIS (HCC) WRITTEN ON 07/30/2013  1:40 PM BY Kooper Chriswell B, MD  No clear evidence of progression.  If she ends up requiring a J-tube as noted above, we will see if she develops changes and infiltrates. If she does develop new chest imaging abnormalities (consolidation, groundglass etc.) then we would consider an open lung biopsy. I don't for see this happening in 2015. "

## 2013-08-01 ENCOUNTER — Telehealth: Payer: Self-pay | Admitting: Internal Medicine

## 2013-08-01 ENCOUNTER — Ambulatory Visit: Payer: Medicare Other

## 2013-08-01 MED ORDER — HYOSCYAMINE SULFATE 0.125 MG SL SUBL: 0.1250 mg | SUBLINGUAL_TABLET | SUBLINGUAL | Status: DC | PRN

## 2013-08-01 NOTE — Telephone Encounter (Signed)
Soreness can be seen after endoscopy and after injection I would estimate 3-4 days and the soreness should improve Levsin can be offered for cramping on an as needed basis

## 2013-08-01 NOTE — Telephone Encounter (Signed)
Patient reports that she has had "soreness" and an achy pain in her esophagus.  She is c/o excess belching and occasional cramping.  She is asking if this is normal? How long should this continue? She does not have any pain or fever just "soreness".  Please advise

## 2013-08-01 NOTE — Addendum Note (Signed)
Addended by: Marlon Pel on: 08/01/2013 03:40 PM   Modules accepted: Orders

## 2013-08-06 ENCOUNTER — Telehealth: Payer: Self-pay | Admitting: Internal Medicine

## 2013-08-06 NOTE — Telephone Encounter (Signed)
Patient reports that she is having reflux of food at times with belching and straining to have a BM.  Discussed with her that she will want to try soft easier to digest foods.  Reccommended that she avoid straining with BM and to use stool softener if needed.  She is reassured that ok to wait until the office visit on 08/28/13.  I have asked that she try a soft diet and see if that improves her symptoms.  She will call back for any additional questions or concerns.

## 2013-08-06 NOTE — Telephone Encounter (Signed)
Discussed her concerns with with our billing Department and due to her insurance, hemoglobin A1c will not be covered. Please let her know that I apologize that we are unable to change the diagnosis code as it already billed.  Donika K. Posey Pronto, DO

## 2013-08-07 ENCOUNTER — Encounter: Payer: Self-pay | Admitting: Internal Medicine

## 2013-08-07 ENCOUNTER — Encounter: Payer: Self-pay | Admitting: Neurology

## 2013-08-07 MED ORDER — HYDROCODONE-ACETAMINOPHEN 5-325 MG PO TABS: 1.0000 | ORAL_TABLET | Freq: Two times a day (BID) | ORAL | Status: DC | PRN

## 2013-08-07 NOTE — Telephone Encounter (Signed)
Note sent back.

## 2013-08-08 ENCOUNTER — Telehealth: Payer: Self-pay | Admitting: Neurology

## 2013-08-08 ENCOUNTER — Ambulatory Visit (INDEPENDENT_AMBULATORY_CARE_PROVIDER_SITE_OTHER): Payer: Medicare Other | Admitting: *Deleted

## 2013-08-08 DIAGNOSIS — E538 Deficiency of other specified B group vitamins: Secondary | ICD-10-CM

## 2013-08-08 MED ORDER — CYANOCOBALAMIN 1000 MCG/ML IJ SOLN
1000.0000 ug | Freq: Once | INTRAMUSCULAR | Status: AC
  Administered 2013-08-08: 1000 ug via INTRAMUSCULAR

## 2013-08-08 NOTE — Telephone Encounter (Signed)
Pt needs to talk to you about her a1c  lab bill dx please [phone]

## 2013-08-28 ENCOUNTER — Encounter: Payer: Self-pay | Admitting: Internal Medicine

## 2013-08-28 ENCOUNTER — Ambulatory Visit (INDEPENDENT_AMBULATORY_CARE_PROVIDER_SITE_OTHER): Payer: Medicare Other | Admitting: Internal Medicine

## 2013-08-28 VITALS — BP 134/60 | HR 76 | Ht 63.0 in | Wt 107.8 lb

## 2013-08-28 DIAGNOSIS — R1314 Dysphagia, pharyngoesophageal phase: Secondary | ICD-10-CM

## 2013-08-28 DIAGNOSIS — Z791 Long term (current) use of non-steroidal anti-inflammatories (NSAID): Secondary | ICD-10-CM

## 2013-08-28 DIAGNOSIS — K224 Dyskinesia of esophagus: Secondary | ICD-10-CM

## 2013-08-28 DIAGNOSIS — Z8709 Personal history of other diseases of the respiratory system: Secondary | ICD-10-CM

## 2013-08-28 DIAGNOSIS — E46 Unspecified protein-calorie malnutrition: Secondary | ICD-10-CM

## 2013-08-28 DIAGNOSIS — K220 Achalasia of cardia: Secondary | ICD-10-CM

## 2013-08-28 DIAGNOSIS — M2550 Pain in unspecified joint: Secondary | ICD-10-CM

## 2013-08-28 MED ORDER — ESOMEPRAZOLE MAGNESIUM 40 MG PO CPDR: 40.0000 mg | DELAYED_RELEASE_CAPSULE | Freq: Every day | ORAL | Status: DC

## 2013-08-28 NOTE — Progress Notes (Signed)
Subjective:    Patient ID: Anne [name], female    DOB: 11/14/1950, 63 y.o.   MRN: [id number]  HPI Anne [name] 63 year old female with a past medical history of asthma with pulmonary fibrosis status post permanent tracheostomy, history CVA, migraines, history of aspiration which has been recurrent, dysphagia and migraines who is seen in followup. She is here today with her husband. She is here today after recent upper endoscopy with Botox to the LES. 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 She's had ongoing trouble swallowing but also issues with recurrent aspiration. It was hypothesized that esophageal stasis may have been contributing to aspiration.  EGD with Botox was performed on 07/29/2013. Esophagus mucosa appeared normal the Z line was regular. Botox injection in 4 quadrant was performed at the LES. The stomach appeared normal except for prior gastrostomy tube site on the lesser curve in the examined duodenum was normal. There was a very small hiatal hernia was seen on retroflexion.    Today she reports that her swallowing unfortunately has not improved with Botox to the LES. She has noticed that with Valsalva maneuvers she has regurgitation of fluid. He has not noticed further heartburn. She is not currently taking PPI but previously was on Nexium. Appetite remains fairly depressed but she has not lost additional weight. Her husband notes that she "eats so little".  Bowel movements been regular without blood or melena. No abdominal pain. There times when she feels that solid food sticks in her mid chest.  Urgently no recent aspiration pneumonias and she has been off of prednisone and antibiotics for some time. Continues to followup with Dr. Lake Bells  She has been seen by neurology to evaluate peripheral neuropathy and was found to have an isolated elevated serum  copper. Liver enzymes previously normal.  Finally she has noticed increase in diffuse joint and muscle pain. She reports feeling cold in hands at all times. She notes muscle stiffness. All seemed to start after having an allergic reaction to Levaquin some months ago. She is using 02-1599 mg of ibuprofen and also additional Tylenol daily for joint pains. She was seen by rheumatology years ago but for joint injection not work up these diffuse symptoms.   Review of Systems As per history of present illness, otherwise negative  Current Medications, Allergies, Past Medical History, Past Surgical History, Family History and Social History were reviewed in [employer] record.      Objective:   Physical Exam BP 134/60  Pulse 76  Ht 5\' 3"  (1.6 m)  Wt 107 lb 12.8 oz (48.898 kg)  BMI 19.10 kg/m2 Constitutional: Well-developed, thin. No distress. HEENT: Normocephalic and atraumatic. Oropharynx is clear and moist. No oropharyngeal exudate. Conjunctivae are normal.  No scleral icterus. Neck: Neck supple. Tracheostomy in place Cardiovascular: Normal rate, regular rhythm and intact distal pulses. No M/R/G Pulmonary/chest: Distant breath sounds without wheezing or rales Abdominal: Soft, thin, nontender, nondistended. Bowel sounds active throughout. 2 healed PEG scars Extremities: no clubbing, cyanosis, or edema Neurological: Alert and oriented to person place and time. Skin: Skin is warm and dry. No rashes noted. Psychiatric: Normal mood and affect. Behavior is normal.  CMP     Component Value Date/Time   NA 140 07/01/2013 0942   K 3.4* 07/01/2013 0942   CL 101 07/01/2013 0942   CO2 31 07/01/2013 0942   GLUCOSE 80 07/01/2013 0942  BUN 11 07/01/2013 0942   CREATININE 0.9 07/01/2013 0942   CALCIUM 9.1 07/01/2013 0942   PROT 6.1 07/01/2013 0942   ALBUMIN 3.6 07/01/2013 0942   AST 21 07/01/2013 0942   ALT 14 07/01/2013 0942   ALKPHOS 79 07/01/2013 0942   BILITOT 0.6 07/01/2013 0942     Esophageal Mano:  Interpretation / Findings  High-resolution esophageal manometry performed on 10 consecutive swallows to evaluate clinical history of dysphagia  --Elevated residual LES pressure (17.4 mmHg) with abnormal LES relaxation  --Instances of abnormal/ failed esophageal peristalsis, 4 of 10 swallows (also with pan-pressurization)  --Instances of rapid esophageal contraction and incomplete bolus clearance (4 of 10 swallows)  Impressions  1. Esophageal dysmotility with abnormal esophageal peristalsis and elevated residual pressure at the lower esophageal sphincter. 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  1.  Esophageal dysmotility/recurrent aspiration -- not much improvement symptomatically and dysphagia, which is disappointing. She is having more reflux and regurgitation with laxity and the LES but this will be a transient phenomenon. The Botox will wear off and eventually her lower esophageal sphincter pressure will likely return to baseline.  Given the lack of result, no role for Heller myotomy. Aspiration continues I recommended J-tube placement. She is hesitant to undergo this at present, but she has discussed this previously with both me and Roselie Awkward  2.  Malnutrition -- I do think she has an element of mild malnutrition likely due to dysphagia and poor by mouth intake. J-tube placement with nocturnal feeds to supplement calories would likely benefit her. We discussed nutrition referral for calorie count and to assess her intake and requirements. She is very agreeable to this and referral was placed  3.  Joint pains and muscle pain -- diffuse symptoms unclear etiology. I recommend rheumatology consult for further evaluation and  to exclude inflammatory etiology such as RA or PMR. She'll be referred to Dr. Amil Amen.  She is using NSAIDs at fairly high doses and on daily basis, and for this reason I recommended that she resume Nexium daily as prophylaxis for ulcers and gastroduodenitis.

## 2013-08-28 NOTE — Patient Instructions (Addendum)
You have been referred to Dr. Amil Amen in Rheumatology. Their office will contact you to schedule your appointment.  You have been referred to a Nutritionist. Zacarias Pontes will contact you to schedule that appointment.  Resume Nexium

## 2013-09-09 ENCOUNTER — Ambulatory Visit (INDEPENDENT_AMBULATORY_CARE_PROVIDER_SITE_OTHER): Payer: Medicare Other | Admitting: *Deleted

## 2013-09-09 DIAGNOSIS — E538 Deficiency of other specified B group vitamins: Secondary | ICD-10-CM

## 2013-09-09 MED ORDER — CYANOCOBALAMIN 1000 MCG/ML IJ SOLN
1000.0000 ug | Freq: Once | INTRAMUSCULAR | Status: AC
  Administered 2013-09-09: 1000 ug via INTRAMUSCULAR

## 2013-09-11 ENCOUNTER — Telehealth: Payer: Self-pay | Admitting: Pulmonary Disease

## 2013-09-11 MED ORDER — PREDNISONE 10 MG PO TABS
ORAL_TABLET | ORAL | Status: DC
Start: 2013-09-11 — End: 2013-11-11

## 2013-09-11 NOTE — Telephone Encounter (Signed)
I called spoke with pt. She feels the weather is making her feel SOB. She has a dry cough from asthma per pt. Denies any chills, aches, congestion. Offered appt but states she has grandchildren and not able to come in this week. She is using nebs about every 3-4 hrs. Requetsing recs. Please advise Dr. Lake Bells thanks  Allergies  Allergen Reactions  . Shellfish Allergy Anaphylaxis  . Quinolones     Feet swell  . Asa [Aspirin]     Tongue swelling  . Avelox [Moxifloxacin]     Increased heart rate  . Ciprofloxacin Swelling    ALL OF THE "FLOXACIN"  . Eggs Or Egg-Derived Products     GI Upset  . Influenza Vaccines   . Levaquin [Levofloxacin In D5w]     Aches and swelling  . Septra [Sulfamethoxazole-Tmp Ds]     Mouth sores

## 2013-09-11 NOTE — Telephone Encounter (Signed)
Tell her to first try breathing with the trach uncapped for a few hours If that doesn't help, then take prednisone taper: Take 40mg  po daily for 3 days, then take 30mg  po daily for 3 days, then take 20mg  po daily for two days, then take 10mg  po daily for 2 days

## 2013-09-11 NOTE — Telephone Encounter (Signed)
Called spoke with pt. Aware of recs. Rx sent in. Nothing further needed

## 2013-09-29 ENCOUNTER — Telehealth: Payer: Self-pay | Admitting: Internal Medicine

## 2013-09-29 NOTE — Telephone Encounter (Signed)
Spoke with the patient-she reports mucous stools and a crampy sensation of bloating/irritation in her abdomen-she has seen streaks of blood in the stool once-she has not tried her Shelbie Hutching may try this-she has an appt with her PCP tomorrow-she agrees to talk with her about these Sx's-declines appt here until she discusses it with the PCP

## 2013-09-29 NOTE — Telephone Encounter (Signed)
Levsin can and should be tried for cramping abdominal discomfort, take as directed Agree with PCP or advanced practitioner visit with [place] This could be an infectious enteritis/colitis, if she is having diarrhea stool study should be recommended, GI pathogen panel When was her last colonoscopy?

## 2013-09-30 ENCOUNTER — Other Ambulatory Visit: Payer: Self-pay | Admitting: Internal Medicine

## 2013-09-30 ENCOUNTER — Encounter: Payer: Self-pay | Admitting: Internal Medicine

## 2013-09-30 ENCOUNTER — Telehealth: Payer: Self-pay | Admitting: Internal Medicine

## 2013-09-30 ENCOUNTER — Ambulatory Visit (INDEPENDENT_AMBULATORY_CARE_PROVIDER_SITE_OTHER): Payer: Medicare Other | Admitting: Internal Medicine

## 2013-09-30 ENCOUNTER — Other Ambulatory Visit: Payer: Self-pay | Admitting: *Deleted

## 2013-09-30 VITALS — BP 100/58 | HR 71 | Temp 97.9°F | Ht 63.0 in | Wt 106.5 lb

## 2013-09-30 DIAGNOSIS — R5383 Other fatigue: Secondary | ICD-10-CM | POA: Insufficient documentation

## 2013-09-30 DIAGNOSIS — R5381 Other malaise: Secondary | ICD-10-CM

## 2013-09-30 DIAGNOSIS — T17908A Unspecified foreign body in respiratory tract, part unspecified causing other injury, initial encounter: Secondary | ICD-10-CM

## 2013-09-30 DIAGNOSIS — T17908D Unspecified foreign body in respiratory tract, part unspecified causing other injury, subsequent encounter: Secondary | ICD-10-CM

## 2013-09-30 DIAGNOSIS — R197 Diarrhea, unspecified: Secondary | ICD-10-CM

## 2013-09-30 DIAGNOSIS — Z5189 Encounter for other specified aftercare: Secondary | ICD-10-CM

## 2013-09-30 LAB — COMPREHENSIVE METABOLIC PANEL
ALT: 16 U/L (ref 0–35)
AST: 23 U/L (ref 0–37)
Albumin: 3.9 g/dL (ref 3.5–5.2)
Alkaline Phosphatase: 69 U/L (ref 39–117)
BUN: 10 mg/dL (ref 6–23)
CO2: 32 mEq/L (ref 19–32)
Calcium: 9.5 mg/dL (ref 8.4–10.5)
Chloride: 100 mEq/L (ref 96–112)
Creatinine, Ser: 0.9 mg/dL (ref 0.4–1.2)
GFR: 71.55 mL/min (ref >60.00)
Glucose, Bld: 73 mg/dL (ref 70–99)
Potassium: 3.4 mEq/L — ABNORMAL LOW (ref 3.5–5.1)
Sodium: 139 mEq/L (ref 135–145)
Total Bilirubin: 0.5 mg/dL (ref 0.2–1.2)
Total Protein: 6.8 g/dL (ref 6.0–8.3)

## 2013-09-30 LAB — CBC WITH DIFFERENTIAL/PLATELET
Basophils Absolute: 0 10*3/uL (ref 0.0–0.1)
Basophils Relative: 0.6 % (ref 0.0–3.0)
Eosinophils Absolute: 0.3 10*3/uL (ref 0.0–0.7)
Eosinophils Relative: 6.2 % — ABNORMAL HIGH (ref 0.0–5.0)
HCT: 38.6 % (ref 36.0–46.0)
Hemoglobin: 12.8 g/dL (ref 12.0–15.0)
Lymphocytes Relative: 26.2 % (ref 12.0–46.0)
Lymphs Abs: 1.2 10*3/uL (ref 0.7–4.0)
MCHC: 33.1 g/dL (ref 30.0–36.0)
MCV: 94.4 fl (ref 78.0–100.0)
Monocytes Absolute: 0.4 10*3/uL (ref 0.1–1.0)
Monocytes Relative: 8 % (ref 3.0–12.0)
Neutro Abs: 2.7 10*3/uL (ref 1.4–7.7)
Neutrophils Relative %: 59 % (ref 43.0–77.0)
Platelets: 220 10*3/uL (ref 150.0–400.0)
RBC: 4.09 Mil/uL (ref 3.87–5.11)
RDW: 13.3 % (ref 11.5–15.5)
WBC: 4.5 10*3/uL (ref 4.0–10.5)

## 2013-09-30 NOTE — Telephone Encounter (Signed)
Will you call and ask her if she can do 1:30 on July 14th

## 2013-09-30 NOTE — Progress Notes (Signed)
Subjective:    Patient ID: Anne [name], female    DOB: 01/01/1951, 63 y.o.   MRN: [id number]  HPI 63YO female presents for follow up.  Recently had esophageal botox injection.  No improvement in aspiration symptoms. Does not want to consider having a feeding tube again. Referral was made to [facility]. This is pending.  Feeling generally fatigued. Aching "all over." GI physician also made referral to rheumatology. This is pending. Taking prn Hydrocodone for severe pain with some improvement.  Had some rectal bleeding this past weekend in setting of diarrhea. Diarrhea described as watery with mucous and some blood on tissue.  Typically 2 episodes of watery diarrhea daily. Ongoing for several weeks.No fever, chills. Some crampy abdominal pain but no consistent focal pain.  Review of Systems  Constitutional: Positive for fatigue. Negative for fever, chills, appetite change and unexpected weight change.  Eyes: Negative for visual disturbance.  Respiratory: Positive for cough (occasional). Negative for shortness of breath, wheezing and stridor.   Cardiovascular: Negative for chest pain and leg swelling.  Gastrointestinal: Positive for abdominal pain (crampy), diarrhea and blood in stool. Negative for nausea, vomiting, constipation, abdominal distention, anal bleeding and rectal pain.  Musculoskeletal: Positive for arthralgias and myalgias.  Skin: Negative for color change and rash.  Hematological: Negative for adenopathy. Does not bruise/bleed easily.  Psychiatric/Behavioral: Negative for dysphoric mood. The patient is not nervous/anxious.        Objective:    BP 100/58  Pulse 71  Temp(Src) 97.9 F (36.6 C) (Oral)  Ht 5\' 3"  (1.6 m)  Wt 106 lb 8 oz (48.308 kg)  BMI 18.87 kg/m2  SpO2 99% Physical Exam  Constitutional: She is oriented to person, place, and time. She appears well-developed and well-nourished. No distress.  HENT:  Head: Normocephalic and atraumatic.  Right  Ear: External ear normal.  Left Ear: External ear normal.  Nose: Nose normal.  Mouth/Throat: Oropharynx is clear and moist. No oropharyngeal exudate.  Eyes: Conjunctivae are normal. Pupils are equal, round, and reactive to light. Right eye exhibits no discharge. Left eye exhibits no discharge. No scleral icterus.  Neck: Normal range of motion. Neck supple. No tracheal deviation present. No thyromegaly present.  Cardiovascular: Normal rate, regular rhythm, normal heart sounds and intact distal pulses.  Exam reveals no gallop and no friction rub.   No murmur heard. Pulmonary/Chest: Effort normal and breath sounds normal. No accessory muscle usage. Not tachypneic. No respiratory distress. She has no decreased breath sounds. She has no wheezes. She has no rhonchi. She has no rales. She exhibits no tenderness.  Abdominal: Soft. Bowel sounds are normal. She exhibits no distension and no mass. There is tenderness (mild diffuse). There is no rebound and no guarding.  Musculoskeletal: Normal range of motion. She exhibits no edema and no tenderness.  Lymphadenopathy:    She has no cervical adenopathy.  Neurological: She is alert and oriented to person, place, and time. No cranial nerve deficit. She exhibits normal muscle tone. Coordination normal.  Skin: Skin is warm and dry. No rash noted. She is not diaphoretic. No erythema. No pallor.  Psychiatric: She has a normal mood and affect. Her behavior is normal. Judgment and thought content normal.          Assessment & Plan:   Problem List Items Addressed This Visit     Unprioritized   Chronic pulmonary aspiration     Chronic pulmonary aspiration. Symptoms not improved with recent botox injections. Pt declines repeat feeding  tube. Nutrition evaluation pending. Will follow.    Diarrhea - Primary     Several weeks of watery diarrhea with occasional small amount of BRB and mucous. Will evaluate with stool culture, CDiff toxin. Will check CBC, CMP. CT  abdomen for further evaluation of colitis. Follow up in 1 week and prn.    Relevant Orders      Stool Culture      Stool C-Diff Toxin Assay      CBC w/Diff      Comprehensive metabolic panel      CT Abdomen Pelvis W Contrast   Other malaise and fatigue     Generalized fatigue, malaise with diffuse myalgia. Will check CBC, CMP, TSH with labs. Likely multifactorial with malnutrition, chronic aspiration and chronic prednisone use. Rheumatology evaluation pending.    Relevant Orders      TSH      B12       Return in about 1 week (around 10/07/2013) for Recheck.

## 2013-09-30 NOTE — Telephone Encounter (Signed)
error 

## 2013-09-30 NOTE — Assessment & Plan Note (Signed)
Generalized fatigue, malaise with diffuse myalgia. Will check CBC, CMP, TSH with labs. Likely multifactorial with malnutrition, chronic aspiration and chronic prednisone use. Rheumatology evaluation pending.

## 2013-09-30 NOTE — Progress Notes (Signed)
Pre visit review using our clinic review tool, if applicable. No additional management support is needed unless otherwise documented below in the visit note. 

## 2013-09-30 NOTE — Telephone Encounter (Signed)
Spoke with female answering the home phone-he reports the patient is going to have a CT scan on Friday-he will ask her to call back

## 2013-09-30 NOTE — Patient Instructions (Signed)
CT abdomen after prep completed.  Labs today with stool culture.  Follow up next week.

## 2013-09-30 NOTE — Assessment & Plan Note (Signed)
Several weeks of watery diarrhea with occasional small amount of BRB and mucous. Will evaluate with stool culture, CDiff toxin. Will check CBC, CMP. CT abdomen for further evaluation of colitis. Follow up in 1 week and prn.

## 2013-09-30 NOTE — Assessment & Plan Note (Signed)
Chronic pulmonary aspiration. Symptoms not improved with recent botox injections. Pt declines repeat feeding tube. Nutrition evaluation pending. Will follow.

## 2013-09-30 NOTE — Telephone Encounter (Signed)
Please advise where to add pt on schedule for 1 week follow up/msn

## 2013-10-01 LAB — TSH: TSH: 0.58 u[IU]/mL (ref 0.35–4.50)

## 2013-10-01 LAB — VITAMIN B12: Vitamin B-12: 586 pg/mL (ref 211–911)

## 2013-10-02 LAB — C. DIFFICILE GDH AND TOXIN A/B
C. difficile GDH: NOT DETECTED
C. difficile Toxin A/B: NOT DETECTED

## 2013-10-03 ENCOUNTER — Ambulatory Visit: Payer: Self-pay | Admitting: Internal Medicine

## 2013-10-05 LAB — STOOL CULTURE

## 2013-10-06 NOTE — Telephone Encounter (Signed)
Anne [name], please add pt f/u appt on 10/27/13 at 2:00, I already spoke with pt

## 2013-10-06 NOTE — Telephone Encounter (Signed)
Patient has been scheduled

## 2013-10-07 ENCOUNTER — Encounter: Payer: Self-pay | Admitting: Internal Medicine

## 2013-10-07 DIAGNOSIS — L989 Disorder of the skin and subcutaneous tissue, unspecified: Secondary | ICD-10-CM

## 2013-10-09 ENCOUNTER — Encounter: Payer: Self-pay | Admitting: Internal Medicine

## 2013-10-09 ENCOUNTER — Telehealth: Payer: Self-pay | Admitting: *Deleted

## 2013-10-09 NOTE — Telephone Encounter (Signed)
Dr Gilford Rile is asking about pt's dermatology appt.  Has this been scheduled yet?

## 2013-10-10 ENCOUNTER — Telehealth: Payer: Self-pay | Admitting: Internal Medicine

## 2013-10-10 NOTE — Telephone Encounter (Signed)
Can you please get a copy of her CT from [facility]?

## 2013-10-10 NOTE — Telephone Encounter (Signed)
Pt called requesting Ct results from 7.7.15.  Please advise

## 2013-10-10 NOTE — Telephone Encounter (Signed)
Wanting CT results

## 2013-10-10 NOTE — Telephone Encounter (Signed)
CT abdomen showed chronic thickening in the stomach wall, likely from previous feeding tubes and inflammation. There were no findings to explain chronic symptoms of diarrhea. I would recommend that she follow up with Dr. Hilarie Fredrickson.

## 2013-10-10 NOTE — Telephone Encounter (Signed)
Notified pt. 

## 2013-10-10 NOTE — Telephone Encounter (Signed)
The referral was put in on July 14th.  Still in progress and being worked on.  Thanks!

## 2013-10-10 NOTE — Telephone Encounter (Signed)
Spoke with Belenda Cruise at [facility] she is faxing result.

## 2013-10-13 ENCOUNTER — Encounter: Payer: Self-pay | Admitting: Internal Medicine

## 2013-10-14 ENCOUNTER — Ambulatory Visit (INDEPENDENT_AMBULATORY_CARE_PROVIDER_SITE_OTHER): Payer: Medicare Other | Admitting: *Deleted

## 2013-10-14 ENCOUNTER — Ambulatory Visit: Payer: Medicare Other

## 2013-10-14 ENCOUNTER — Ambulatory Visit: Payer: Medicare Other | Admitting: Adult Health

## 2013-10-14 DIAGNOSIS — E538 Deficiency of other specified B group vitamins: Secondary | ICD-10-CM

## 2013-10-14 MED ORDER — CYANOCOBALAMIN 1000 MCG/ML IJ SOLN
1000.0000 ug | Freq: Once | INTRAMUSCULAR | Status: AC
  Administered 2013-10-14: 1000 ug via INTRAMUSCULAR

## 2013-10-16 ENCOUNTER — Telehealth: Payer: Self-pay | Admitting: Pulmonary Disease

## 2013-10-16 ENCOUNTER — Telehealth: Payer: Self-pay | Admitting: Internal Medicine

## 2013-10-16 MED ORDER — ALBUTEROL SULFATE HFA 108 (90 BASE) MCG/ACT IN AERS: 1.0000 | INHALATION_SPRAY | Freq: Four times a day (QID) | RESPIRATORY_TRACT | Status: DC | PRN

## 2013-10-16 NOTE — Telephone Encounter (Signed)
Okay to refill? 

## 2013-10-16 NOTE — Telephone Encounter (Signed)
Pt called in and was needing to get a refill of hydrocodone. Called Dr. Kathee Delton office and said they would refill but she doesn't wanna drive to [place] and would like to just pick up in [place].

## 2013-10-16 NOTE — Telephone Encounter (Signed)
lmomtcb x1 for pt 

## 2013-10-16 NOTE — Telephone Encounter (Signed)
Fine to refill 

## 2013-10-16 NOTE — Telephone Encounter (Signed)
Pt requesting to pick up Rx for Hydrocodone Cough Syrup from [place] office.  Pt aware that Dr Lake Bells will not be back in [place] until 11/18/13 seeing patients that that no one will be going to [place] between now and then.  I advised pt that she could pick up the rx from [place] at our office. Pt refused and stated that she was going to check with Dr Cheron Schaumann office to see if they could refill it for her as they are located closer to her. I advised the pt that if Dr Cheron Schaumann refused to refill for her then she could contact our office back  Refill for Albuterol HFA sent to [facility]. Pt aware Nothing further needed.

## 2013-10-17 ENCOUNTER — Other Ambulatory Visit: Payer: Self-pay | Admitting: *Deleted

## 2013-10-17 MED ORDER — HYDROCOD POLST-CHLORPHEN POLST 10-8 MG/5ML PO LQCR: 5.0000 mL | Freq: Two times a day (BID) | ORAL | Status: DC | PRN

## 2013-10-17 MED ORDER — HYDROCODONE-ACETAMINOPHEN 5-325 MG PO TABS: 1.0000 | ORAL_TABLET | Freq: Two times a day (BID) | ORAL | Status: DC | PRN

## 2013-10-17 NOTE — Telephone Encounter (Signed)
The patient came by to pick up her prescription for chlorpheniramine-HYDROcodone (TUSSIONEX) 10-8 MG/5ML LQCR ,but was given a prescription for HYDROcodone-acetaminophen (NORCO/VICODIN) 5-325 MG per table. Needing a prescription for the syrup. She did leave the prescription for the tablet.

## 2013-10-17 NOTE — Telephone Encounter (Signed)
Anne [name], fine to provide alternative Rx.

## 2013-10-17 NOTE — Telephone Encounter (Signed)
Rx signed and put in folder ready for pick up, notified pt

## 2013-10-17 NOTE — Telephone Encounter (Signed)
Rx printed, waiting for signature

## 2013-10-17 NOTE — Telephone Encounter (Signed)
Pt called to check on rx. Please call on cell phone when ready to be pick/msn

## 2013-10-20 NOTE — Telephone Encounter (Signed)
New Rx is ready for pick up, placed in folder

## 2013-10-21 ENCOUNTER — Telehealth: Payer: Self-pay | Admitting: Internal Medicine

## 2013-10-21 NOTE — Telephone Encounter (Signed)
Patient is provided the number to [facility] to set up her appt.  Appts are scheduled with the patient.  She has decided to return to Dr. Jefm Bryant for rheumatology for now, she has seen him in the past.  .  She will call back if she changes her mind to be set up with Dr. Amil Amen.

## 2013-10-22 ENCOUNTER — Encounter: Payer: Self-pay | Admitting: Internal Medicine

## 2013-10-23 NOTE — Telephone Encounter (Signed)
I am sorry to hear that a nutrition consult is not covered in the pt with malnutrition If there is no way to get it covered then I would like for her to keep a calorie count (as best as possible) for 7 days to help determine level of intake. Thanks

## 2013-10-27 ENCOUNTER — Ambulatory Visit: Payer: Medicare Other | Admitting: Internal Medicine

## 2013-10-30 ENCOUNTER — Telehealth: Payer: Self-pay | Admitting: Internal Medicine

## 2013-10-31 ENCOUNTER — Encounter: Payer: Self-pay | Admitting: Internal Medicine

## 2013-10-31 NOTE — Telephone Encounter (Signed)
We have some records from [place] [facility] [facility], but no labs.. I have left a message for the patient with the correct fax to have the labs sent to.   Left message for patient to call back

## 2013-11-03 NOTE — Telephone Encounter (Signed)
See my Chart messages for additional correspondence.  Will await labs from [facility]

## 2013-11-07 NOTE — Telephone Encounter (Signed)
I have placed the labs in your office .  Please review labs and advise

## 2013-11-10 NOTE — Telephone Encounter (Signed)
I did receive records from Dr. Schar[facility] Gl[facility] rheumatology clinic, these will be scanned into her chart Regarding esophageal stenting, there is no role for stenting the esophagus in patients with achalasia/motility disorder Unfortunately I do not have another great option to help her esophageal motility, we have previously talked about repeat feeding tube she has been wanting to avoid this Another option is a second opinion with an esophageal expert. Dr. Renford Dills comes to mind from [facility] if she is interested

## 2013-11-11 ENCOUNTER — Telehealth: Payer: Self-pay | Admitting: Pulmonary Disease

## 2013-11-11 MED ORDER — PREDNISONE 10 MG PO TABS: ORAL_TABLET | ORAL | Status: DC

## 2013-11-11 NOTE — Telephone Encounter (Signed)
Sure Take 40mg  po daily for 3 days, then take 30mg  po daily for 3 days, then take 20mg  po daily for two days, then take 10mg  po daily for 2 days See me within 1-2 weeks

## 2013-11-11 NOTE — Telephone Encounter (Signed)
Pt aware of recs. RX sent in. appt scheduled to see BQ [place]. Nothing further needed

## 2013-11-11 NOTE — Telephone Encounter (Signed)
Called and spoke with pt and she stated that she has been having an asthma flare and she has tried all of her tricks to do at home. Pt is requesting to have prednisone called in for her.  She stated that she has been using her neb and prn rescue inhaler.   She stated that her chest feels tight.  BQ please advise. Thanks  Allergies  Allergen Reactions  . Shellfish Allergy Anaphylaxis  . Quinolones     Feet swell  . Asa [Aspirin]     Tongue swelling  . Avelox [Moxifloxacin]     Increased heart rate  . Ciprofloxacin Swelling    ALL OF THE "FLOXACIN"  . Eggs Or Egg-Derived Products     GI Upset  . Influenza Vaccines   . Levaquin [Levofloxacin In D5w]     Aches and swelling  . Septra [Sulfamethoxazole-Tmp Ds]     Mouth sores    Current Outpatient Prescriptions on File Prior to Visit  Medication Sig Dispense Refill  . albuterol (PROVENTIL HFA;VENTOLIN HFA) 108 (90 BASE) MCG/ACT inhaler Inhale 1 puff into the lungs every 6 (six) hours as needed for wheezing or shortness of breath.  1 Inhaler  6  . ALPRAZolam (XANAX) 0.25 MG tablet Take 0.125-0.25 mg by mouth 2 (two) times daily as needed for anxiety. Takes 1/2 in the morning and 1 at night      . benzonatate (TESSALON) 100 MG capsule Take 1 capsule (100 mg total) by mouth every 6 (six) hours as needed for cough.  30 capsule  1  . cetirizine (ZYRTEC) 10 MG tablet Take 1 tablet (10 mg total) by mouth daily.  30 tablet  5  . chlorpheniramine-HYDROcodone (TUSSIONEX) 10-8 MG/5ML LQCR Take 5 mLs by mouth every 12 (twelve) hours as needed for cough.  115 mL  0  . clopidogrel (PLAVIX) 75 MG tablet Take 1 tablet (75 mg total) by mouth every morning.  30 tablet  5  . Cyanocobalamin (VITAMIN B-12 IJ) Inject as directed every 30 (thirty) days.      [name] esomeprazole (NEXIUM) 40 MG capsule Take 1 capsule (40 mg total) by mouth daily at 12 noon.  30 capsule  6  . estrogens, conjugated, (PREMARIN) 0.625 MG tablet Take 0.625 mg by mouth every evening.      .  furosemide (LASIX) 20 MG tablet TAKE 1 TABLET BY MOUTH ONCE A DAY AS NEEDED  30 tablet  5  . HYDROcodone-acetaminophen (NORCO/VICODIN) 5-325 MG per tablet Take 1 tablet by mouth 2 (two) times daily as needed for moderate pain.  60 tablet  0  . hydroxypropyl methylcellulose (ISOPTO TEARS) 2.5 % ophthalmic solution Place 1 drop into both eyes 3 (three) times daily as needed for dry eyes.      . hyoscyamine (LEVSIN SL) 0.125 MG SL tablet Place 1 tablet (0.125 mg total) under the tongue every 4 (four) hours as needed.  30 tablet  0  . ibuprofen (ADVIL,MOTRIN) 200 MG tablet Take 400-600 mg by mouth every 6 (six) hours as needed for mild pain or moderate pain.      [name] ipratropium-albuterol (DUONEB) 0.5-2.5 (3) MG/3ML SOLN Take 3 mLs by nebulization every 4 (four) hours as needed. Dx 496  360 mL  2  . montelukast (SINGULAIR) 10 MG tablet TAKE 1 TABLET BY MOUTH EVERY DAY  90 tablet  1  . predniSONE (DELTASONE) 10 MG tablet Take 40mg  po daily x 3 days, then take 30mg  po daily x 3 days,  then take 20mg  po daily x two days, then take 10mg  po daily x 2 days  27 tablet  0  . sodium chloride (OCEAN) 0.65 % SOLN nasal spray Place 1 spray into both nostrils as needed for congestion.       No current facility-administered medications on file prior to visit.  '

## 2013-11-17 ENCOUNTER — Other Ambulatory Visit: Payer: Self-pay | Admitting: Internal Medicine

## 2013-11-18 ENCOUNTER — Ambulatory Visit (INDEPENDENT_AMBULATORY_CARE_PROVIDER_SITE_OTHER): Payer: Medicare Other | Admitting: *Deleted

## 2013-11-18 DIAGNOSIS — E538 Deficiency of other specified B group vitamins: Secondary | ICD-10-CM

## 2013-11-18 MED ORDER — HYDROCODONE-ACETAMINOPHEN 5-325 MG PO TABS: 1.0000 | ORAL_TABLET | Freq: Two times a day (BID) | ORAL | Status: DC | PRN

## 2013-11-18 MED ORDER — CYANOCOBALAMIN 1000 MCG/ML IJ SOLN
1000.0000 ug | Freq: Once | INTRAMUSCULAR | Status: AC
  Administered 2013-11-18: 1000 ug via INTRAMUSCULAR

## 2013-11-18 NOTE — Telephone Encounter (Signed)
Pt given Rx while in office for B12

## 2013-11-18 NOTE — Telephone Encounter (Signed)
See my chart message

## 2013-11-20 ENCOUNTER — Ambulatory Visit: Payer: Medicare Other | Admitting: Pulmonary Disease

## 2013-11-29 ENCOUNTER — Encounter: Payer: Self-pay | Admitting: Internal Medicine

## 2013-11-29 ENCOUNTER — Other Ambulatory Visit: Payer: Self-pay | Admitting: Internal Medicine

## 2013-12-02 NOTE — Telephone Encounter (Signed)
Last refill 7.22.15, last OV 7.7.15, next OV 10.30.15.  Please advise refill

## 2013-12-09 ENCOUNTER — Ambulatory Visit: Payer: Medicare Other | Admitting: Pulmonary Disease

## 2013-12-24 ENCOUNTER — Telehealth: Payer: Self-pay | Admitting: Pulmonary Disease

## 2013-12-24 NOTE — Telephone Encounter (Signed)
Spoke with the pt  She states that last year she got the flu vaccine and had slight swelling and itching in her throat  She took a benadryl and this helped  She is asking if Dr Lake Bells rec that she take the vaccine again this year  Please advise, thanks!

## 2013-12-24 NOTE — Telephone Encounter (Signed)
Avoid it

## 2013-12-25 NOTE — Telephone Encounter (Signed)
Called and spoke to pt. Informed pt of recs per BQ. Pt verbalized understanding and denied any further questions or concerns at this time.

## 2014-01-05 ENCOUNTER — Encounter: Payer: Self-pay | Admitting: Internal Medicine

## 2014-01-05 ENCOUNTER — Encounter: Payer: Self-pay | Admitting: Pulmonary Disease

## 2014-01-05 ENCOUNTER — Ambulatory Visit (INDEPENDENT_AMBULATORY_CARE_PROVIDER_SITE_OTHER): Payer: Medicare Other | Admitting: Pulmonary Disease

## 2014-01-05 VITALS — BP 114/62 | HR 70 | Ht 63.0 in | Wt 105.0 lb

## 2014-01-05 DIAGNOSIS — Z23 Encounter for immunization: Secondary | ICD-10-CM

## 2014-01-05 DIAGNOSIS — J449 Chronic obstructive pulmonary disease, unspecified: Secondary | ICD-10-CM

## 2014-01-05 DIAGNOSIS — J690 Pneumonitis due to inhalation of food and vomit: Secondary | ICD-10-CM

## 2014-01-05 DIAGNOSIS — J8410 Pulmonary fibrosis, unspecified: Secondary | ICD-10-CM

## 2014-01-05 NOTE — Assessment & Plan Note (Addendum)
" >>  ASSESSMENT AND PLAN FOR ASTHMA-COPD OVERLAP SYNDROME (HCC) WRITTEN ON 01/05/2014 12:33 PM BY Kamorah Nevils B, MD  This has been a stable interval. She never really received much benefit from long acting bronchodilators so I have advised that she continue using albuterol  on a when necessary basis. Today we will give her the flu shot intended for people with egg allergy. She can have the pneumonia shot on her next visit.  Plan: -Flu shot -When necessary albuterol    >>ASSESSMENT AND PLAN FOR PULMONARY FIBROSIS (HCC) WRITTEN ON 01/05/2014 12:30 PM BY Jerime Arif B, MD  There is no evidence of progression at this time. As I have outlined in previous notes I would only pursue an open lung biopsy if we have confirmed that she does not have chronic aspiration. At this point, considering the fact that she continues to have trouble swallowing my index of suspicion for some degree of aspiration remains high. "

## 2014-01-05 NOTE — Progress Notes (Signed)
Subjective:    Patient ID: [name], female    DOB: [birth year], 63 y.o.   MRN: [id number]  Synopsis: She enjoys establish care with the [facility] pulmonary clinic in may of 2014. 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 A CT scan in 2013 performed at [facility] showed some groundglass changes, interstitial thickening, and interlobular thickening in the upper lobes predominantly.  This had progressed since 2011.  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  She was also evaluated by [facility] in 2014 and was found to have significant esophageal dysmotility and increased LES tone.  She has had a J tube on two separate occasions in the past.   On 07/29/2013 she had lower esophageal Botox injections.  HPI    Chief Complaint  Patient presents with  . Follow-up    Pt states breathing is stable.  C/o some PND, sinus congestion, burning eyes.     01/05/2014 ROV > [name] says that she has been doing OK.  Her breathing has been [place]. She was very active this weekend at a football game and had no real trouble walking around. She was able to climb a lighthouse tower recently which made her short of breath.  Otherwise she has not had major flares of her respiratory problems. She has been having some back and shoulder pain.  She doesn't think that the Botox injection really helped too much. She tried to maintain a calorie count and said she only ate 400-600 calories a day.   She is not using Symbicort.  She is not using the albuterol much lately.    Past Medical History  Diagnosis Date  . Asthma   . COPD (chronic obstructive pulmonary disease)   . Stroke     slurred speech, drawn face, imaging normal, occurred twice, [facility]  . Migraine   . Allergic rhinitis   . Anemia   . IBS (irritable bowel syndrome)   . Shingles   . Tracheostomy in place   . Complication of anesthesia     Breathing problems  . Pulmonary fibrosis   . Vocal cord paresis      Review of Systems  Constitutional: Negative for fever, chills and fatigue.  HENT: Negative for congestion, postnasal drip, rhinorrhea, sinus pressure and sore throat.   Respiratory: Positive for shortness of breath. Negative for cough and chest tightness.   Cardiovascular: Negative for chest pain, palpitations and leg swelling.       Objective:   Physical Exam  Filed Vitals:   01/05/14 0940  BP: 114/62  Pulse: 70  Height: $Remove'5\' 3"'nAAuNCt$  (1.6 m)  Weight: 105 lb (47.628 kg)  SpO2: 100%  RA  06/18/2013 Ambulated 500 feet on RA and O2 saturation never dropped less than 97%  Gen:  Well appearing, normal HEENT: NCAT, EOMi, OP clear,  PULM: CTA B  CV: RRR, no mgr, no JVD AB: BS+, soft, nontender, no hsm Ext: warm, no edema, no clubbing, no cyanosis   04/[facility] at  [facility]>> emphysema bilaterally; upper lobe groundglass opacification, interstitial thickening, interlobular thickening, and apical cappingnoted bilaterally. This is significantly worsened since the 2007 comparison study. 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 She never really received much benefit from long acting bronchodilators so I have advised that she continue using albuterol on a when necessary basis. Today we will give her the flu shot intended for people with egg allergy. She can have the pneumonia shot on her next visit.  Plan: -Flu shot -When necessary albuterol  Postinflammatory pulmonary fibrosis There is no evidence of progression at this time. As I have outlined in previous notes I would only pursue an open lung biopsy if we have confirmed that she does not have chronic aspiration. At this point, considering the fact that she continues to have trouble swallowing my index of suspicion for some degree of aspiration remains high.  Aspiration pneumonia This is a recurrent problem but at this point there is no evidence of active aspiration pneumonia.  As I have outlined in many notes previously, when she comes in complaining of a respiratory infection the provider seeing her needs to auscultate her lungs while the cap on her tracheostomy has been removed. This would will eliminate wheezing from her vocal cords. Second, if she is complaining of mucus production and/or fever then she needs to have a chest x-ray to confirm the presence of pneumonia.  Should she have an infection she should be treated with Augmentin twice a day for 14 days.    Updated Medication List Outpatient Encounter Prescriptions as of 01/05/2014  Medication Sig  . albuterol (PROVENTIL HFA;VENTOLIN HFA) 108 (90 BASE) MCG/ACT inhaler Inhale 1 puff into the lungs every 6 (six) hours as needed for wheezing or shortness of  breath.  . ALPRAZolam (XANAX) 0.25 MG tablet TAKE 1 TABLET BY MOUTH 2 TIMES A DAY AS NEEDED  . benzonatate (TESSALON) 100 MG capsule Take 1 capsule (100 mg total) by mouth every 6 (six) hours as needed for cough.  . cetirizine (ZYRTEC) 10 MG tablet Take 1 tablet (10 mg total) by mouth daily.  . chlorpheniramine-HYDROcodone (TUSSIONEX) 10-8 MG/5ML LQCR Take 5 mLs by mouth every 12 (twelve) hours as needed for cough.  . clopidogrel (PLAVIX) 75 MG tablet Take 1 tablet (75 mg total) by mouth every morning.  . Cyanocobalamin (VITAMIN B-12 IJ) Inject as directed every 30 (thirty) days.  [name] esomeprazole (NEXIUM) 40 MG capsule Take 1 capsule (40 mg total) by mouth daily at 12 noon.  . estrogens, conjugated, (PREMARIN) 0.625 MG tablet Take 0.625 mg by mouth every evening.  . fluticasone (FLONASE) 50 MCG/ACT nasal spray Place 2 sprays into both nostrils daily.  . furosemide (LASIX) 20 MG tablet TAKE 1 TABLET BY MOUTH ONCE A  DAY AS NEEDED  . HYDROcodone-acetaminophen (NORCO/VICODIN) 5-325 MG per tablet Take 1 tablet by mouth 2 (two) times daily as needed for moderate pain.  . hydroxypropyl methylcellulose (ISOPTO TEARS) 2.5 % ophthalmic solution Place 1 drop into both eyes 3 (three) times daily as needed for dry eyes.  . hyoscyamine (LEVSIN SL) 0.125 MG SL tablet Place 1 tablet (0.125 mg total) under the tongue every 4 (four) hours as needed.  [name] ibuprofen (ADVIL,MOTRIN) 200 MG tablet Take 400-600 mg by mouth every 6 (six) hours as needed for mild pain or moderate pain.  [name] ipratropium-albuterol (DUONEB) 0.5-2.5 (3) MG/3ML SOLN Take 3 mLs by nebulization every 4 (four) hours as needed. Dx 496  . montelukast (SINGULAIR) 10 MG tablet TAKE 1 TABLET BY MOUTH EVERY DAY  . sodium chloride (OCEAN) 0.65 % SOLN nasal spray Place 1 spray into both nostrils as needed for congestion.  . [DISCONTINUED] predniSONE (DELTASONE) 10 MG tablet $RemoveB'40mg'iYoLCTLy$  po daily for 3 days, then take $RemoveB'30mg'kkROLGaW$  po daily for 3 days, then take $RemoveB'20mg'qsJSvWck$  po daily  for two days, then take $RemoveB'10mg'hnOIZkXQ$  po daily for 2 days

## 2014-01-05 NOTE — Assessment & Plan Note (Signed)
There is no evidence of progression at this time. As I have outlined in previous notes I would only pursue an open lung biopsy if we have confirmed that she does not have chronic aspiration. At this point, considering the fact that she continues to have trouble swallowing my index of suspicion for some degree of aspiration remains high.

## 2014-01-05 NOTE — Patient Instructions (Signed)
Get the pneumonia shot when you see Dr. Gilford Rile next time Stay active We will see you back in 6 months

## 2014-01-05 NOTE — Progress Notes (Deleted)
Subjective:    Patient ID: [name], female    DOB: [birth year], 63 y.o.   MRN: [id number]  HPI    Review of Systems     Objective:   Physical Exam        Assessment & Plan:    Subjective:    Patient ID: [name], female    DOB: Dec 06, 1950, 63 y.o.   MRN: [id number]  Synopsis: She enjoys establish care with the [name] in may of 2014. 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 A CT scan in 2013 performed at [name] showed some groundglass changes, interstitial thickening, and interlobular thickening in the upper lobes predominantly.  This had progressed since 2011.  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  She was also evaluated by [name] in 2014 and was found to have significant esophageal dysmotility and increased LES tone.  She has had a J tube on two separate occasions in the past.   On 07/29/2013 she had lower esophageal Botox injections.  HPI    Chief Complaint  Patient presents with  . Follow-up    Pt states breathing is stable.  C/o some PND, sinus congestion, burning eyes.     01/05/2014 ROV > [name] says that she has been doing OK.  Her breathing has been [name]. She was very active this weekend at a football game and had no real trouble walking around. She was able to climb a lighthouse tower recently which made her short of breath.  Otherwise she has not had major flares of her respiratory  problems. She has been having some back and shoulder pain.  She doesn't think that the Botox injection really helped too much. She tried to maintain a calorie count and said she only ate 400-600 calories a day.  She is not using Symbicort.  She is not using the albuterol much lately.    Past Medical History  Diagnosis Date  . Asthma   . COPD (chronic obstructive pulmonary disease)   . Stroke     slurred speech, drawn face, imaging normal, occurred twice, [name]  . Migraine   . Allergic rhinitis   . Anemia   . IBS (irritable bowel syndrome)   . Shingles   . Tracheostomy in place   . Complication of anesthesia     Breathing problems  . Pulmonary fibrosis   . Vocal cord paresis      Review of Systems  Constitutional: Negative for fever, chills and fatigue.  HENT: Negative for congestion, postnasal drip, rhinorrhea, sinus pressure and sore throat.   Respiratory: Positive for shortness of breath. Negative for cough and chest tightness.   Cardiovascular: Negative for chest pain, palpitations and leg swelling.       Objective:   Physical Exam  Filed Vitals:   01/05/14 0940  BP: 114/62  Pulse: 70  Height: $Remove'5\' 3"'bxDbSIm$  (1.6 m)  Weight: 105 lb (47.628 kg)  SpO2: 100%  RA  06/18/2013 Ambulated 500 feet  on RA and O2 saturation never dropped less than 97%  Gen:  Well appearing, hoarse voice HEENT: NCAT, EOMi, OP clear,  PULM: Mild upper airway wheezing completely resolved after removing tracheostomy***  CV: RRR, no mgr, no JVD AB: BS+, soft, nontender, no hsm Ext: warm, no edema, no clubbing, no cyanosis   04/2011 CT chest at [name]>> emphysema bilaterally; upper lobe groundglass opacification, interstitial thickening, interlobular thickening, and apical cappingnoted bilaterally. This is significantly worsened since the 2007 comparison study. 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   Updated Medication List Outpatient Encounter Prescriptions as of 01/05/2014  Medication Sig  . albuterol (PROVENTIL HFA;VENTOLIN HFA) 108 (90 BASE) MCG/ACT inhaler Inhale 1 puff into the lungs every 6 (six) hours as needed for wheezing or shortness of breath.  . ALPRAZolam (XANAX) 0.25 MG tablet TAKE 1 TABLET BY MOUTH 2 TIMES A DAY AS NEEDED  . benzonatate (TESSALON) 100 MG capsule Take 1 capsule (100 mg total) by mouth every 6 (six) hours as needed for cough.  . cetirizine (ZYRTEC) 10 MG tablet Take 1 tablet (10 mg total) by mouth daily.  . chlorpheniramine-HYDROcodone (TUSSIONEX) 10-8 MG/5ML LQCR Take 5 mLs by mouth every 12 (twelve) hours as needed for cough.  . clopidogrel (PLAVIX) 75 MG tablet Take 1 tablet (75 mg total) by mouth every morning.  . Cyanocobalamin (VITAMIN B-12 IJ) Inject as directed every 30 (thirty) days.  [name] esomeprazole (NEXIUM) 40 MG capsule Take 1 capsule (40 mg total) by mouth daily at 12 noon.  . estrogens, conjugated, (PREMARIN) 0.625 MG tablet Take 0.625 mg by mouth every evening.  . fluticasone (FLONASE) 50 MCG/ACT nasal spray Place 2 sprays into both nostrils daily.  . furosemide (LASIX) 20 MG tablet TAKE 1 TABLET BY MOUTH ONCE A DAY AS NEEDED  . HYDROcodone-acetaminophen (NORCO/VICODIN) 5-325 MG per tablet Take  1 tablet by mouth 2 (two) times daily as needed for moderate pain.  . hydroxypropyl methylcellulose (ISOPTO TEARS) 2.5 % ophthalmic solution Place 1 drop into both eyes 3 (three) times daily as needed for dry eyes.  . hyoscyamine (LEVSIN SL) 0.125 MG SL tablet Place 1 tablet (0.125 mg total) under the tongue every 4 (four) hours as needed.  [name] ibuprofen (ADVIL,MOTRIN) 200 MG tablet Take 400-600 mg by mouth every 6 (six) hours as needed for mild pain or moderate pain.  [name] ipratropium-albuterol (DUONEB) 0.5-2.5 (3) MG/3ML SOLN Take 3 mLs by nebulization every 4 (four) hours as needed. Dx 496  . montelukast (SINGULAIR) 10 MG tablet TAKE 1 TABLET BY MOUTH EVERY DAY  . sodium chloride (OCEAN) 0.65 % SOLN nasal spray Place 1 spray into both nostrils as needed for congestion.  . [DISCONTINUED] predniSONE (DELTASONE) 10 MG tablet $RemoveB'40mg'cRtazOhy$  po daily for  3 days, then take $RemoveB'30mg'bZleeAOn$  po daily for 3 days, then take $RemoveB'20mg'GnUbXFqf$  po daily for two days, then take $RemoveB'10mg'vYypSRbz$  po daily for 2 days

## 2014-01-05 NOTE — Assessment & Plan Note (Signed)
This is a recurrent problem but at this point there is no evidence of active aspiration pneumonia.  As I have outlined in many notes previously, when she comes in complaining of a respiratory infection the provider seeing her needs to auscultate her lungs while the cap on her tracheostomy has been removed. This would will eliminate wheezing from her vocal cords. Second, if she is complaining of mucus production and/or fever then she needs to have a chest x-ray to confirm the presence of pneumonia.  Should she have an infection she should be treated with Augmentin twice a day for 14 days.

## 2014-01-08 ENCOUNTER — Telehealth: Payer: Self-pay | Admitting: Pulmonary Disease

## 2014-01-08 NOTE — Telephone Encounter (Signed)
Pt returned call [phone]

## 2014-01-08 NOTE — Telephone Encounter (Signed)
lmomtcb x1 

## 2014-01-08 NOTE — Telephone Encounter (Signed)
Called and spoke with pt and she stated that she did get her flu shot on Monday and started with all of the congestion/chest and head Monday night.  She is not getting any better and does not feel this is related to the flu vaccine.  She stated that she was outside in the rain over the weekend and feels this started the process.  Pt has been using her ibuprofen and nebulizer but this is not helping.  All of the congestion has been clear so far.  BQ please advise. Thanks  Allergies  Allergen Reactions  . Shellfish Allergy Anaphylaxis  . Quinolones     Feet swell  . Asa [Aspirin]     Tongue swelling  . Avelox [Moxifloxacin]     Increased heart rate  . Ciprofloxacin Swelling    ALL OF THE "FLOXACIN"  . Eggs Or Egg-Derived Products     GI Upset  . Influenza Vaccines   . Levaquin [Levofloxacin In D5w]     Aches and swelling  . Septra [Sulfamethoxazole-Tmp Ds]     Mouth sores    Current Outpatient Prescriptions on File Prior to Visit  Medication Sig Dispense Refill  . albuterol (PROVENTIL HFA;VENTOLIN HFA) 108 (90 BASE) MCG/ACT inhaler Inhale 1 puff into the lungs every 6 (six) hours as needed for wheezing or shortness of breath.  1 Inhaler  6  . ALPRAZolam (XANAX) 0.25 MG tablet TAKE 1 TABLET BY MOUTH 2 TIMES A DAY AS NEEDED  60 tablet  3  . benzonatate (TESSALON) 100 MG capsule Take 1 capsule (100 mg total) by mouth every 6 (six) hours as needed for cough.  30 capsule  1  . cetirizine (ZYRTEC) 10 MG tablet Take 1 tablet (10 mg total) by mouth daily.  30 tablet  5  . chlorpheniramine-HYDROcodone (TUSSIONEX) 10-8 MG/5ML LQCR Take 5 mLs by mouth every 12 (twelve) hours as needed for cough.  115 mL  0  . clopidogrel (PLAVIX) 75 MG tablet Take 1 tablet (75 mg total) by mouth every morning.  30 tablet  5  . Cyanocobalamin (VITAMIN B-12 IJ) Inject as directed every 30 (thirty) days.      [name] esomeprazole (NEXIUM) 40 MG capsule Take 1 capsule (40 mg total) by mouth daily at 12 noon.  30 capsule  6   . estrogens, conjugated, (PREMARIN) 0.625 MG tablet Take 0.625 mg by mouth every evening.      . fluticasone (FLONASE) 50 MCG/ACT nasal spray Place 2 sprays into both nostrils daily.      . furosemide (LASIX) 20 MG tablet TAKE 1 TABLET BY MOUTH ONCE A DAY AS NEEDED  30 tablet  5  . HYDROcodone-acetaminophen (NORCO/VICODIN) 5-325 MG per tablet Take 1 tablet by mouth 2 (two) times daily as needed for moderate pain.  60 tablet  0  . hydroxypropyl methylcellulose (ISOPTO TEARS) 2.5 % ophthalmic solution Place 1 drop into both eyes 3 (three) times daily as needed for dry eyes.      . hyoscyamine (LEVSIN SL) 0.125 MG SL tablet Place 1 tablet (0.125 mg total) under the tongue every 4 (four) hours as needed.  30 tablet  0  . ibuprofen (ADVIL,MOTRIN) 200 MG tablet Take 400-600 mg by mouth every 6 (six) hours as needed for mild pain or moderate pain.      [name] ipratropium-albuterol (DUONEB) 0.5-2.5 (3) MG/3ML SOLN Take 3 mLs by nebulization every 4 (four) hours as needed. Dx 496  360 mL  2  . montelukast (SINGULAIR)  10 MG tablet TAKE 1 TABLET BY MOUTH EVERY DAY  90 tablet  1  . sodium chloride (OCEAN) 0.65 % SOLN nasal spray Place 1 spray into both nostrils as needed for congestion.       No current facility-administered medications on file prior to visit.

## 2014-01-09 ENCOUNTER — Other Ambulatory Visit: Payer: Self-pay | Admitting: Internal Medicine

## 2014-01-09 MED ORDER — PREDNISONE 10 MG PO TABS: ORAL_TABLET | ORAL | Status: DC

## 2014-01-09 NOTE — Telephone Encounter (Signed)
Called and spoke with pt and she is aware of BQ requests.  She is aware of meds that have been sent to the pharmacy and nothing further is needed.

## 2014-01-09 NOTE — Telephone Encounter (Signed)
Since the mucus is clear will use prednisone alone If change in color of mucus then come back to clinic to be seen Prednisone: Take 40mg  po daily for 3 days, then take 30mg  po daily for 3 days, then take 20mg  po daily for two days, then take 10mg  po daily for 2 days

## 2014-01-09 NOTE — Telephone Encounter (Signed)
ATC, NA and no option to leave msg Will forward back to BQ to be addressed

## 2014-01-09 NOTE — Telephone Encounter (Signed)
Pt has called back & can now be reached at [phone].  [name]

## 2014-01-09 NOTE — Telephone Encounter (Signed)
Pt is concerned b/c she did not receive a call last night from BQ or his nurse(s).  Pt asks to be reached w/in the next hour at [phone].  [name]

## 2014-01-13 ENCOUNTER — Telehealth: Payer: Self-pay | Admitting: *Deleted

## 2014-01-13 ENCOUNTER — Other Ambulatory Visit: Payer: Self-pay | Admitting: *Deleted

## 2014-01-13 MED ORDER — HYDROCODONE-ACETAMINOPHEN 5-325 MG PO TABS: 1.0000 | ORAL_TABLET | Freq: Two times a day (BID) | ORAL | Status: DC | PRN

## 2014-01-13 NOTE — Telephone Encounter (Signed)
Pt called requesting Hydrocodone 5/325 mg refill.  Last refill 8.25.15, last OV 7.7.15.  Please advise refill

## 2014-01-13 NOTE — Telephone Encounter (Signed)
We can refill x1

## 2014-01-13 NOTE — Telephone Encounter (Signed)
Please see below note

## 2014-01-13 NOTE — Telephone Encounter (Signed)
Spoke with pt advised Rx ready for pick up at office

## 2014-01-23 ENCOUNTER — Encounter: Payer: Self-pay | Admitting: Internal Medicine

## 2014-01-23 ENCOUNTER — Other Ambulatory Visit: Payer: Self-pay | Admitting: Internal Medicine

## 2014-01-23 ENCOUNTER — Ambulatory Visit (INDEPENDENT_AMBULATORY_CARE_PROVIDER_SITE_OTHER): Payer: Medicare Other | Admitting: Internal Medicine

## 2014-01-23 VITALS — BP 100/62 | HR 75 | Temp 97.5°F | Ht 63.0 in | Wt 106.8 lb

## 2014-01-23 DIAGNOSIS — J4489 Other specified chronic obstructive pulmonary disease: Secondary | ICD-10-CM

## 2014-01-23 DIAGNOSIS — R5382 Chronic fatigue, unspecified: Secondary | ICD-10-CM

## 2014-01-23 DIAGNOSIS — Z1239 Encounter for other screening for malignant neoplasm of breast: Secondary | ICD-10-CM

## 2014-01-23 DIAGNOSIS — M79602 Pain in left arm: Secondary | ICD-10-CM

## 2014-01-23 DIAGNOSIS — R9431 Abnormal electrocardiogram [ECG] [EKG]: Secondary | ICD-10-CM

## 2014-01-23 DIAGNOSIS — K224 Dyskinesia of esophagus: Secondary | ICD-10-CM

## 2014-01-23 DIAGNOSIS — J449 Chronic obstructive pulmonary disease, unspecified: Secondary | ICD-10-CM

## 2014-01-23 DIAGNOSIS — Z23 Encounter for immunization: Secondary | ICD-10-CM

## 2014-01-23 LAB — COMPREHENSIVE METABOLIC PANEL
ALT: 14 U/L (ref 0–35)
AST: 25 U/L (ref 0–37)
Albumin: 3.4 g/dL — ABNORMAL LOW (ref 3.5–5.2)
Alkaline Phosphatase: 68 U/L (ref 39–117)
BUN: 16 mg/dL (ref 6–23)
CO2: 25 mEq/L (ref 19–32)
Calcium: 9.4 mg/dL (ref 8.4–10.5)
Chloride: 102 mEq/L (ref 96–112)
Creatinine, Ser: 1 mg/dL (ref 0.4–1.2)
GFR: 57.92 mL/min — ABNORMAL LOW (ref >60.00)
Glucose, Bld: 83 mg/dL (ref 70–99)
Potassium: 3.7 mEq/L (ref 3.5–5.1)
Sodium: 140 mEq/L (ref 135–145)
Total Bilirubin: 0.7 mg/dL (ref 0.2–1.2)
Total Protein: 6.8 g/dL (ref 6.0–8.3)

## 2014-01-23 MED ORDER — HYDROCOD POLST-CHLORPHEN POLST 10-8 MG/5ML PO LQCR: 5.0000 mL | Freq: Two times a day (BID) | ORAL | Status: DC | PRN

## 2014-01-23 NOTE — Assessment & Plan Note (Signed)
Recent episode of left arm and jaw pain that woke her from sleep. EKG today. Previous nuclear stress test reportedly normal. We discussed follow up with cardiology.

## 2014-01-23 NOTE — Assessment & Plan Note (Signed)
Generally doing well, after recent steroid taper. Follow up with Dr. Lake Bells as scheduled.

## 2014-01-23 NOTE — Progress Notes (Signed)
Pre visit review using our clinic review tool, if applicable. No additional management support is needed unless otherwise documented below in the visit note. 

## 2014-01-23 NOTE — Assessment & Plan Note (Signed)
Chronic fatigue. Likely multifactorial with malnutrition, chronic pulmonary issues. Rheumatology evaluation showed no evidence of PMR. Encouraged her to inquire about [facility].

## 2014-01-23 NOTE — Patient Instructions (Signed)
Labs today.  Follow up in 3 months and sooner as needed. 

## 2014-01-23 NOTE — Progress Notes (Signed)
Subjective:    Patient ID: Anne [name], female    DOB: May 03, 1950, 63 y.o.   MRN: [id number]  HPI 63YO female presents for follow up.  She was evaluated in July 2015 for diarrhea and generalized fatigue. CT abdomen showed thickening of the gastric body, most likely gastritis. She has been following with Dr. Hilarie Fredrickson in GI. Diarrhea has improved somewhat. Referral to nutrition has been made, however insurance will not cover.  She was also seen by Dr. Jefm Bryant in rheumatology. Evaluation for PMR was negative.  Seen by Dr. Lake Bells and had some malaise for about 1 week after receiving flu vaccine. Started on Prednisone 2 weeks ago for bronchitis flare. Symptoms of cough and dyspnea have improved.  Had a recent episode of jaw and left arm pain that woke her from sleep. Has not recurred. Notes exercise intolerance with dyspnea on exertion. Previously had cardiology evaluation with nuclear stress test which was normal.   Review of Systems  Constitutional: Positive for fatigue. Negative for fever, chills, appetite change and unexpected weight change.  Eyes: Negative for visual disturbance.  Respiratory: Positive for cough and shortness of breath. Negative for chest tightness and wheezing.   Cardiovascular: Negative for chest pain and leg swelling.  Gastrointestinal: Positive for diarrhea. Negative for nausea, vomiting, abdominal pain, constipation and blood in stool.  Musculoskeletal: Positive for arthralgias, back pain and myalgias.  Skin: Negative for color change and rash.  Hematological: Negative for adenopathy. Does not bruise/bleed easily.  Psychiatric/Behavioral: Negative for sleep disturbance and dysphoric mood. The patient is not nervous/anxious.        Objective:    BP 100/62  Pulse 75  Temp(Src) 97.5 F (36.4 C) (Oral)  Ht $R'5\' 3"'mm$  (1.6 m)  Wt 106 lb 12 oz (48.421 kg)  BMI 18.91 kg/m2  SpO2 99% Physical Exam  Constitutional: She is oriented to person, place, and time.  She appears well-developed and well-nourished. No distress.  HENT:  Head: Normocephalic and atraumatic.  Right Ear: External ear normal.  Left Ear: External ear normal.  Nose: Nose normal.  Mouth/Throat: Oropharynx is clear and moist. No oropharyngeal exudate.  Eyes: Conjunctivae are normal. Pupils are equal, round, and reactive to light. Right eye exhibits no discharge. Left eye exhibits no discharge. No scleral icterus.  Neck: Normal range of motion. Neck supple. No tracheal deviation present. No thyromegaly present.    Cardiovascular: Normal rate, regular rhythm, normal heart sounds and intact distal pulses.  Exam reveals no gallop and no friction rub.   No murmur heard. Pulmonary/Chest: Effort normal and breath sounds normal. No accessory muscle usage. Not tachypneic. No respiratory distress. She has no decreased breath sounds. She has no wheezes. She has no rhonchi. She has no rales. She exhibits no tenderness.  Musculoskeletal: Normal range of motion. She exhibits no edema and no tenderness.  Lymphadenopathy:    She has no cervical adenopathy.  Neurological: She is alert and oriented to person, place, and time. No cranial nerve deficit. She exhibits normal muscle tone. Coordination normal.  Skin: Skin is warm and dry. No rash noted. She is not diaphoretic. No erythema. No pallor.  Psychiatric: She has a normal mood and affect. Her behavior is normal. Judgment and thought content normal.          Assessment & Plan:   Problem List Items Addressed This Visit     Unprioritized   COPD with asthma     Generally doing well, after recent steroid taper. Follow up with  Dr. Lake Bells as scheduled.    Relevant Medications      chlorpheniramine-HYDROcodone (TUSSIONEX) 10-8 MG/5ML LQCR   Esophageal dysmotility     Will set up follow up with Dr. Hilarie Fredrickson given chronic issues with esophageal dysmotility and gastritis. Note recent CT abdomen showed thickening of gastric antrum. Question if she  will need follow up endoscopy.    Fatigue - Primary     Chronic fatigue. Likely multifactorial with malnutrition, chronic pulmonary issues. Rheumatology evaluation showed no evidence of PMR. Encouraged her to inquire about [facility].    Relevant Orders      Comp Met (CMET)   Left arm pain     Recent episode of left arm and jaw pain that woke her from sleep. EKG today. Previous nuclear stress test reportedly normal. We discussed follow up with cardiology.    Relevant Orders      EKG 12-Lead   Screening for breast cancer     Mammogram ordered.    Relevant Orders      MM Digital Screening       Return in about 3 months (around 04/25/2014) for Recheck.

## 2014-01-23 NOTE — Assessment & Plan Note (Signed)
Mammogram ordered

## 2014-01-23 NOTE — Assessment & Plan Note (Signed)
Will set up follow up with Dr. Hilarie Fredrickson given chronic issues with esophageal dysmotility and gastritis. Note recent CT abdomen showed thickening of gastric antrum. Question if she will need follow up endoscopy.

## 2014-01-26 ENCOUNTER — Other Ambulatory Visit: Payer: Self-pay | Admitting: Internal Medicine

## 2014-01-27 ENCOUNTER — Encounter: Payer: Self-pay | Admitting: Internal Medicine

## 2014-01-29 ENCOUNTER — Ambulatory Visit: Payer: Medicare Other

## 2014-01-29 ENCOUNTER — Encounter: Payer: Self-pay | Admitting: Cardiovascular Disease

## 2014-01-29 ENCOUNTER — Ambulatory Visit (INDEPENDENT_AMBULATORY_CARE_PROVIDER_SITE_OTHER): Payer: Medicare Other | Admitting: Cardiovascular Disease

## 2014-01-29 VITALS — BP 132/83 | HR 73 | Ht 63.5 in | Wt 108.0 lb

## 2014-01-29 DIAGNOSIS — M79602 Pain in left arm: Secondary | ICD-10-CM

## 2014-01-29 DIAGNOSIS — R079 Chest pain, unspecified: Secondary | ICD-10-CM

## 2014-01-29 NOTE — Patient Instructions (Addendum)
[place]  Your caregiver has ordered a Stress Test with nuclear imaging. The purpose of this test is to evaluate the blood supply to your heart muscle. This procedure is referred to as a "Non-Invasive Stress Test." This is because other than having an IV started in your vein, nothing is inserted or "invades" your body. Cardiac stress tests are done to find areas of poor blood flow to the heart by determining the extent of coronary artery disease (CAD). Some patients exercise on a treadmill, which naturally increases the blood flow to your heart, while others who are  unable to walk on a treadmill due to physical limitations have a pharmacologic/chemical stress agent called Lexiscan . This medicine will mimic walking on a treadmill by temporarily increasing your coronary blood flow.   Please note: these test may take anywhere between 2-4 hours to complete  PLEASE REPORT TO [place] AT THE FIRST DESK WILL DIRECT YOU WHERE TO GO  Date of Procedure:________11/10/15_____________________________  Arrival Time for Procedure:____0745 am__________________________   PLEASE NOTIFY THE OFFICE AT LEAST 24 HOURS IN ADVANCE IF YOU ARE UNABLE TO KEEP YOUR APPOINTMENT.  [phone] AND  PLEASE NOTIFY NUCLEAR MEDICINE AT [facility] AT LEAST 24 HOURS IN ADVANCE IF YOU ARE UNABLE TO KEEP YOUR APPOINTMENT. [phone]  How to prepare for your Myoview test:  1. Do not eat or drink after midnight 2. No caffeine for 24 hours prior to test 3. No smoking 24 hours prior to test. 4. Your medication may be taken with water.  If your doctor stopped a medication because of this test, do not take that medication. 5. Ladies, please do not wear dresses.  Skirts or pants are appropriate. Please wear a short sleeve shirt. 6. No perfume, cologne or lotion. 7. Wear comfortable walking shoes. No heels!  Your physician recommends that you schedule a follow-up appointment in:  As needed with  Dr. Fletcher Anon   Your next appointment will be scheduled in our new office located at :  [facility]  [address] [place]  [place], [place] [address]

## 2014-01-30 ENCOUNTER — Encounter: Payer: Self-pay | Admitting: Cardiovascular Disease

## 2014-01-30 NOTE — Progress Notes (Signed)
Primary care physician: Dr. Anderson Malta walker  HPI  This is a pleasant 63 year old female who was referred for evaluation of atypical chest pain. She has no previous cardiac history and no significant risk factors for coronary artery disease other than age. She is not a smoker and there is no family history of coronary artery disease. She did have possible TIAs in the past and has been on Plavix for that reason. She also has chronic nutritional problems requiring tube feeding in the past. Recently, she had an episode of left sided jaw pain radiating to her left arm which woke her up from sleep and lasted for about 5-10 minutes. There was no significant chest discomfort. She had some palpitations associated with it. She has noticed increased exertional dyspnea but denies any exertional chest pain. She reports having a stress test many years ago which was reportedly abnormal but she did not pursue further angiography.     Allergies  Allergen Reactions  . Shellfish Allergy Anaphylaxis  . Quinolones     Feet swell  . Asa [Aspirin]     Tongue swelling  . Avelox [Moxifloxacin]     Increased heart rate  . Ciprofloxacin Swelling    ALL OF THE "FLOXACIN"  . Eggs Or Egg-Derived Products     GI Upset  . Influenza Vaccines   . Levaquin [Levofloxacin In D5w]     Aches and swelling  . Septra [Sulfamethoxazole-Trimethoprim]     Mouth sores     Current Outpatient Prescriptions on File Prior to Visit  Medication Sig Dispense Refill  . albuterol (PROVENTIL HFA;VENTOLIN HFA) 108 (90 BASE) MCG/ACT inhaler Inhale 1 puff into the lungs every 6 (six) hours as needed for wheezing or shortness of breath. 1 Inhaler 6  . ALPRAZolam (XANAX) 0.25 MG tablet TAKE 1 TABLET BY MOUTH 2 TIMES A DAY AS NEEDED 60 tablet 3  . benzonatate (TESSALON) 100 MG capsule Take 1 capsule (100 mg total) by mouth every 6 (six) hours as needed for cough. 30 capsule 1  . cetirizine (ZYRTEC) 10 MG tablet Take 1 tablet (10 mg total) by  mouth daily. 30 tablet 5  . chlorpheniramine-HYDROcodone (TUSSIONEX) 10-8 MG/5ML LQCR Take 5 mLs by mouth every 12 (twelve) hours as needed for cough. 115 mL 0  . clopidogrel (PLAVIX) 75 MG tablet Take 1 tablet (75 mg total) by mouth every morning. 30 tablet 5  . Cyanocobalamin (VITAMIN B-12 IJ) Inject as directed every 30 (thirty) days.    [name] esomeprazole (NEXIUM) 40 MG capsule Take 1 capsule (40 mg total) by mouth daily at 12 noon. 30 capsule 6  . estrogens, conjugated, (PREMARIN) 0.625 MG tablet Take 0.625 mg by mouth every evening.    . fluticasone (FLONASE) 50 MCG/ACT nasal spray Place 2 sprays into both nostrils daily.    . furosemide (LASIX) 20 MG tablet TAKE 1 TABLET BY MOUTH ONCE A DAY AS NEEDED 30 tablet 5  . HYDROcodone-acetaminophen (NORCO/VICODIN) 5-325 MG per tablet Take 1 tablet by mouth 2 (two) times daily as needed for moderate pain. 60 tablet 0  . hydroxypropyl methylcellulose (ISOPTO TEARS) 2.5 % ophthalmic solution Place 1 drop into both eyes 3 (three) times daily as needed for dry eyes.    . hyoscyamine (LEVSIN SL) 0.125 MG SL tablet Place 1 tablet (0.125 mg total) under the tongue every 4 (four) hours as needed. 30 tablet 0  . ibuprofen (ADVIL,MOTRIN) 200 MG tablet Take 400-600 mg by mouth every 6 (six) hours as needed for mild  pain or moderate pain.    [name] ipratropium-albuterol (DUONEB) 0.5-2.5 (3) MG/3ML SOLN Take 3 mLs by nebulization every 4 (four) hours as needed. Dx 496 360 mL 2  . montelukast (SINGULAIR) 10 MG tablet TAKE 1 TABLET BY MOUTH EVERY DAY 90 tablet 1  . PREMARIN 0.625 MG tablet TAKE 1 TABLET BY MOUTH DAILY FOR 30 DAYS 30 tablet 5  . sodium chloride (OCEAN) 0.65 % SOLN nasal spray Place 1 spray into both nostrils as needed for congestion.     No current facility-administered medications on file prior to visit.     Past Medical History  Diagnosis Date  . Asthma   . COPD (chronic obstructive pulmonary disease)   . Stroke     slurred speech, drawn face,  imaging normal, occurred twice, [facility]  . Migraine   . Allergic rhinitis   . Anemia   . IBS (irritable bowel syndrome)   . Shingles   . Tracheostomy in place   . Complication of anesthesia     Breathing problems  . Pulmonary fibrosis   . Vocal cord paresis   . Heart murmur     as child     Past Surgical History  Procedure Laterality Date  . Abdominal hysterectomy    . Tubal ligation    . Tracheostomy  1996    done at [facility], Dr. Kathyrn Sheriff  . Video bronchoscopy Bilateral 11/20/2012    Procedure: VIDEO BRONCHOSCOPY WITH FLUORO;  Surgeon: Juanito Doom, MD;  Location: [facility];  Service: Cardiopulmonary;  Laterality: Bilateral;  . Jejunostomy feeding tube      x2 both failed  . Esophageal manometry N/A 12/16/2012    Procedure: ESOPHAGEAL MANOMETRY (EM);  Surgeon: Jerene Bears, MD;  Location: [facility];  Service: Gastroenterology;  Laterality: N/A;  . Eye surgery      Catarct surgery 2014  . Eye surgery as child    . Esophagogastroduodenoscopy (egd) with propofol N/A 07/29/2013    Procedure: ESOPHAGOGASTRODUODENOSCOPY (EGD) WITH PROPOFOL;  Surgeon: Jerene Bears, MD;  Location: [facility];  Service: Gastroenterology;  Laterality: N/A;  with botox injection  . Botox injection N/A 07/29/2013    Procedure: BOTOX INJECTION;  Surgeon: Jerene Bears, MD;  Location: [facility];  Service: Gastroenterology;  Laterality: N/A;     Family History  Problem Relation Age of Onset  . Asthma Cousin   . COPD Cousin   . Breast cancer Maternal Grandmother   . Cancer Maternal Grandmother     breast  . Breast cancer Maternal Aunt   . Cancer Maternal Aunt     breast  . Asthma Father   . Cancer Father     lung  . Cancer Paternal Uncle     lung  . COPD Paternal Grandfather      History   Social History  . Marital Status: Married    Spouse Name: N/A    Number of Children: 4  . Years of Education: N/A   Occupational History  . Not on file.   Social History Main Topics  .  Smoking status: Never Smoker   . Smokeless tobacco: Never Used  . Alcohol Use: No  . Drug Use: No  . Sexual Activity: Not on file   Other Topics Concern  . Not on file   Social History Narrative   Lives in [place] with husband.  She has four children.   Retired from [employer]  ROS A 10 point review of system was performed. It is negative other than that mentioned in the history of present illness.   PHYSICAL EXAM   BP 132/83 mmHg  Pulse 73  Ht 5' 3.5" (1.613 m)  Wt 108 lb (48.988 kg)  BMI 18.83 kg/m2 Constitutional: She is oriented to person, place, and time. She appears well-developed and well-nourished. No distress.  HENT: No nasal discharge.  Head: Normocephalic and atraumatic.  Eyes: Pupils are equal and round. No discharge.  Neck: Normal range of motion. Neck supple. No JVD present. No thyromegaly present.  Cardiovascular: Normal rate, regular rhythm, normal heart sounds. Exam reveals no gallop and no friction rub. No murmur heard.  Pulmonary/Chest: Effort normal and breath sounds normal. No stridor. No respiratory distress. She has no wheezes. She has no rales. She exhibits no tenderness.  Abdominal: Soft. Bowel sounds are normal. She exhibits no distension. There is no tenderness. There is no rebound and no guarding.  Musculoskeletal: Normal range of motion. She exhibits no edema and no tenderness.  Neurological: She is alert and oriented to person, place, and time. Coordination normal.  Skin: Skin is warm and dry. No rash noted. She is not diaphoretic. No erythema. No pallor.  Psychiatric: She has a normal mood and affect. Her behavior is normal. Judgment and thought content normal.     PVX:YIAXKP sinus rhythm with possible old septal infarct.   ASSESSMENT AND PLAN

## 2014-01-30 NOTE — Assessment & Plan Note (Signed)
The symptoms are overall atypical and unlikely to represent angina. However, she has an abnormal EKG with possible old septal infarct and she reports a previously abnormal stress test years ago. Due to that, I recommend evaluation with a treadmill nuclear stress test.

## 2014-02-03 ENCOUNTER — Encounter: Payer: Self-pay | Admitting: Cardiovascular Disease

## 2014-02-03 ENCOUNTER — Ambulatory Visit: Payer: Self-pay | Admitting: Cardiovascular Disease

## 2014-02-03 DIAGNOSIS — R079 Chest pain, unspecified: Secondary | ICD-10-CM

## 2014-02-04 ENCOUNTER — Telehealth: Payer: Self-pay | Admitting: *Deleted

## 2014-02-04 ENCOUNTER — Telehealth: Payer: Self-pay

## 2014-02-04 ENCOUNTER — Other Ambulatory Visit: Payer: Self-pay

## 2014-02-04 DIAGNOSIS — R079 Chest pain, unspecified: Secondary | ICD-10-CM

## 2014-02-04 DIAGNOSIS — R0602 Shortness of breath: Secondary | ICD-10-CM

## 2014-02-04 NOTE — Telephone Encounter (Signed)
Pt would like stress test results.  

## 2014-02-04 NOTE — Telephone Encounter (Signed)
-----   Message from Wellington Hampshire, MD sent at 02/04/2014 10:05 AM EST ----- Stress test showed no evidence of blockages. However, the EF was low which sometimes is not accurate but nuclear stress test. Schedule her for an echo to evaluate this (chest pain).

## 2014-02-04 NOTE — Telephone Encounter (Signed)
See result note.  

## 2014-02-05 ENCOUNTER — Ambulatory Visit: Payer: Self-pay | Admitting: Internal Medicine

## 2014-02-05 ENCOUNTER — Ambulatory Visit (INDEPENDENT_AMBULATORY_CARE_PROVIDER_SITE_OTHER): Payer: Medicare Other | Admitting: *Deleted

## 2014-02-05 DIAGNOSIS — E538 Deficiency of other specified B group vitamins: Secondary | ICD-10-CM

## 2014-02-05 LAB — HM MAMMOGRAPHY: HM Mammogram: NEGATIVE

## 2014-02-05 MED ORDER — CYANOCOBALAMIN 1000 MCG/ML IJ SOLN
1000.0000 ug | Freq: Once | INTRAMUSCULAR | Status: AC
  Administered 2014-02-05: 1000 ug via INTRAMUSCULAR

## 2014-02-06 ENCOUNTER — Encounter: Payer: Self-pay | Admitting: *Deleted

## 2014-02-10 ENCOUNTER — Encounter: Payer: Self-pay | Admitting: *Deleted

## 2014-02-10 ENCOUNTER — Encounter: Payer: Self-pay | Admitting: Internal Medicine

## 2014-02-16 ENCOUNTER — Other Ambulatory Visit: Payer: Self-pay

## 2014-02-16 ENCOUNTER — Other Ambulatory Visit (INDEPENDENT_AMBULATORY_CARE_PROVIDER_SITE_OTHER): Payer: Medicare Other

## 2014-02-16 DIAGNOSIS — R079 Chest pain, unspecified: Secondary | ICD-10-CM

## 2014-02-16 DIAGNOSIS — R0602 Shortness of breath: Secondary | ICD-10-CM

## 2014-02-17 ENCOUNTER — Ambulatory Visit: Payer: Self-pay | Admitting: Ophthalmology

## 2014-02-17 LAB — CBC WITH DIFFERENTIAL/PLATELET
Basophil #: 0 10*3/uL (ref 0.0–0.1)
Basophil %: 0.8 %
Eosinophil #: 0.2 10*3/uL (ref 0.0–0.7)
Eosinophil %: 4.3 %
HCT: 37.9 % (ref 35.0–47.0)
HGB: 12.6 g/dL (ref 12.0–16.0)
Lymphocyte #: 1.2 10*3/uL (ref 1.0–3.6)
Lymphocyte %: 27.4 %
MCH: 32.3 pg (ref 26.0–34.0)
MCHC: 33.4 g/dL (ref 32.0–36.0)
MCV: 97 fL (ref 80–100)
Monocyte #: 0.3 x10 3/mm (ref 0.2–0.9)
Monocyte %: 8.1 %
Neutrophil #: 2.6 10*3/uL (ref 1.4–6.5)
Neutrophil %: 59.4 %
Platelet: 208 10*3/uL (ref 150–440)
RBC: 3.92 10*6/uL (ref 3.80–5.20)
RDW: 12.5 % (ref 11.5–14.5)
WBC: 4.3 10*3/uL (ref 3.6–11.0)

## 2014-02-17 LAB — SEDIMENTATION RATE: Erythrocyte Sed Rate: 10 mm/hr (ref 0–30)

## 2014-02-20 ENCOUNTER — Other Ambulatory Visit: Payer: Self-pay | Admitting: Internal Medicine

## 2014-02-24 ENCOUNTER — Telehealth: Payer: Self-pay | Admitting: Pulmonary Disease

## 2014-02-24 MED ORDER — PREDNISONE 10 MG PO TABS: ORAL_TABLET | ORAL | Status: DC

## 2014-02-24 NOTE — Telephone Encounter (Signed)
OK by me: Take 40mg po daily for 3 days, then take 30mg po daily for 3 days, then take 20mg po daily for two days, then take 10mg po daily for 2 days  

## 2014-02-24 NOTE — Telephone Encounter (Signed)
Spoke with patient-aware that BQ is giving her a Pred taper and sent to [facility]. Nothing more needed at this time.

## 2014-02-24 NOTE — Telephone Encounter (Signed)
Pt c/o nonprod cough but states she feels mucus moving when she coughs for a few days, PND, sore throat.  Denies fever, sinus congestion.  Is requesting a pred taper.  BQ please advise on this patient.  Thanks!

## 2014-02-27 ENCOUNTER — Telehealth: Payer: Self-pay | Admitting: Pulmonary Disease

## 2014-02-27 MED ORDER — AMOXICILLIN 250 MG/5ML PO SUSR: 250.0000 mg | Freq: Three times a day (TID) | ORAL | Status: DC

## 2014-02-27 NOTE — Telephone Encounter (Signed)
Dr wert please advise.  Thanks!

## 2014-02-27 NOTE — Telephone Encounter (Signed)
z-pak 

## 2014-02-27 NOTE — Telephone Encounter (Signed)
Med sent in, pt aware.  Nothing further needed.

## 2014-02-27 NOTE — Telephone Encounter (Signed)
Amoxicillin 250 mg tid x 10 days (comes in liquid form)

## 2014-02-27 NOTE — Telephone Encounter (Signed)
Called and spoke with pt and she stated that BQ had given her a pred taper but this is not taking care of all the congestion that she is having.  She stated that she has had this for about a week.  She is having all clear congestion but lots of it.  Pt is requesting that an abx be called to the pharmacy.  Pt is also c/o body aches but denies any fever.   BQ is out of the office today.  MW please advise.  Thanks  Allergies  Allergen Reactions  . Shellfish Allergy Anaphylaxis  . Quinolones     Feet swell  . Asa [Aspirin]     Tongue swelling  . Avelox [Moxifloxacin]     Increased heart rate  . Ciprofloxacin Swelling    ALL OF THE "FLOXACIN"  . Eggs Or Egg-Derived Products     GI Upset  . Influenza Vaccines   . Levaquin [Levofloxacin In D5w]     Aches and swelling  . Septra [Sulfamethoxazole-Trimethoprim]     Mouth sores    Current Outpatient Prescriptions on File Prior to Visit  Medication Sig Dispense Refill  . albuterol (PROVENTIL HFA;VENTOLIN HFA) 108 (90 BASE) MCG/ACT inhaler Inhale 1 puff into the lungs every 6 (six) hours as needed for wheezing or shortness of breath. 1 Inhaler 6  . ALPRAZolam (XANAX) 0.25 MG tablet TAKE 1 TABLET BY MOUTH 2 TIMES A DAY AS NEEDED 60 tablet 3  . benzonatate (TESSALON) 100 MG capsule Take 1 capsule (100 mg total) by mouth every 6 (six) hours as needed for cough. 30 capsule 1  . cetirizine (ZYRTEC) 10 MG tablet Take 1 tablet (10 mg total) by mouth daily. 30 tablet 5  . chlorpheniramine-HYDROcodone (TUSSIONEX) 10-8 MG/5ML LQCR Take 5 mLs by mouth every 12 (twelve) hours as needed for cough. 115 mL 0  . clopidogrel (PLAVIX) 75 MG tablet TAKE 1 TABLET (75 MG TOTAL) BY MOUTH EVERY MORNING. 30 tablet 5  . Cyanocobalamin (VITAMIN B-12 IJ) Inject as directed every 30 (thirty) days.    [name] esomeprazole (NEXIUM) 40 MG capsule Take 1 capsule (40 mg total) by mouth daily at 12 noon. 30 capsule 6  . estrogens, conjugated, (PREMARIN) 0.625 MG tablet Take 0.625 mg by  mouth every evening.    . fluticasone (FLONASE) 50 MCG/ACT nasal spray Place 2 sprays into both nostrils daily.    . furosemide (LASIX) 20 MG tablet TAKE 1 TABLET BY MOUTH ONCE A DAY AS NEEDED 30 tablet 5  . HYDROcodone-acetaminophen (NORCO/VICODIN) 5-325 MG per tablet Take 1 tablet by mouth 2 (two) times daily as needed for moderate pain. 60 tablet 0  . hydroxypropyl methylcellulose (ISOPTO TEARS) 2.5 % ophthalmic solution Place 1 drop into both eyes 3 (three) times daily as needed for dry eyes.    . hyoscyamine (LEVSIN SL) 0.125 MG SL tablet Place 1 tablet (0.125 mg total) under the tongue every 4 (four) hours as needed. 30 tablet 0  . ibuprofen (ADVIL,MOTRIN) 200 MG tablet Take 400-600 mg by mouth every 6 (six) hours as needed for mild pain or moderate pain.    [name] ipratropium-albuterol (DUONEB) 0.5-2.5 (3) MG/3ML SOLN Take 3 mLs by nebulization every 4 (four) hours as needed. Dx 496 360 mL 2  . montelukast (SINGULAIR) 10 MG tablet TAKE 1 TABLET BY MOUTH EVERY DAY 90 tablet 1  . predniSONE (DELTASONE) 10 MG tablet 40mg  x 3 days, 30mg  x 3 days, 20mg  x 2 days,10mg  x 2 days 27  tablet 0  . PREMARIN 0.625 MG tablet TAKE 1 TABLET BY MOUTH DAILY FOR 30 DAYS 30 tablet 5  . sodium chloride (OCEAN) 0.65 % SOLN nasal spray Place 1 spray into both nostrils as needed for congestion.     No current facility-administered medications on file prior to visit.

## 2014-02-27 NOTE — Telephone Encounter (Signed)
Can pt have a liquid abx instead thanks

## 2014-03-10 ENCOUNTER — Ambulatory Visit: Payer: Medicare Other

## 2014-03-11 ENCOUNTER — Ambulatory Visit: Payer: Medicare Other

## 2014-03-11 ENCOUNTER — Telehealth: Payer: Self-pay

## 2014-03-11 MED ORDER — HYDROCOD POLST-CHLORPHEN POLST 10-8 MG/5ML PO LQCR: 5.0000 mL | Freq: Two times a day (BID) | ORAL | Status: DC | PRN

## 2014-03-11 NOTE — Telephone Encounter (Signed)
Fine to refill 

## 2014-03-11 NOTE — Telephone Encounter (Signed)
Okay to fill? 

## 2014-03-11 NOTE — Telephone Encounter (Signed)
Rx in folder ready for pick up  

## 2014-03-11 NOTE — Telephone Encounter (Signed)
The patient is hoping to get an rx for tusinex cough syrup printed  (she is hoping to pick it up when she gets her b-12 shot tomorrow- 12/17)

## 2014-03-11 NOTE — Telephone Encounter (Signed)
Rx printed, waiting to be signed

## 2014-03-12 ENCOUNTER — Ambulatory Visit (INDEPENDENT_AMBULATORY_CARE_PROVIDER_SITE_OTHER): Payer: Medicare Other | Admitting: *Deleted

## 2014-03-12 DIAGNOSIS — E538 Deficiency of other specified B group vitamins: Secondary | ICD-10-CM

## 2014-03-12 MED ORDER — CYANOCOBALAMIN 1000 MCG/ML IJ SOLN
1000.0000 ug | Freq: Once | INTRAMUSCULAR | Status: AC
  Administered 2014-03-12: 1000 ug via INTRAMUSCULAR

## 2014-03-23 ENCOUNTER — Other Ambulatory Visit: Payer: Self-pay | Admitting: Internal Medicine

## 2014-03-23 ENCOUNTER — Encounter: Payer: Self-pay | Admitting: Internal Medicine

## 2014-03-24 ENCOUNTER — Other Ambulatory Visit: Payer: Self-pay | Admitting: Internal Medicine

## 2014-03-24 ENCOUNTER — Encounter: Payer: Self-pay | Admitting: Internal Medicine

## 2014-03-24 MED ORDER — HYDROCODONE-ACETAMINOPHEN 5-325 MG PO TABS: 1.0000 | ORAL_TABLET | Freq: Two times a day (BID) | ORAL | Status: DC | PRN

## 2014-04-07 ENCOUNTER — Telehealth: Payer: Self-pay | Admitting: Pulmonary Disease

## 2014-04-07 MED ORDER — AMOXICILLIN-POT CLAVULANATE 250-62.5 MG/5ML PO SUSR: 500.0000 mg | Freq: Two times a day (BID) | ORAL | Status: DC

## 2014-04-07 MED ORDER — PREDNISONE (PAK) 5 MG PO TABS: 5.0000 mg | ORAL_TABLET | ORAL | Status: DC

## 2014-04-07 NOTE — Telephone Encounter (Signed)
Called and spoke to pt. Pt c/o chest and head congestion, non prod cough, increase in SOB, PND, headache and body aches. Pt denies f/c/s. Pt stated she is taking ibuprofen for aches. Pt is requesting liquid amoxicillin and pred if neccessary. Pt stated she is taking the tussinex at night and it is helping the cough. BQ is unavailable today, will send to doc of the day.  Dr. Lenna Gilford please advise.   Allergies  Allergen Reactions  . Shellfish Allergy Anaphylaxis  . Quinolones     Feet swell  . Asa [Aspirin]     Tongue swelling  . Avelox [Moxifloxacin]     Increased heart rate  . Ciprofloxacin Swelling    ALL OF THE "FLOXACIN"  . Eggs Or Egg-Derived Products     GI Upset  . Influenza Vaccines   . Levaquin [Levofloxacin In D5w]     Aches and swelling  . Septra [Sulfamethoxazole-Trimethoprim]     Mouth sores     Current Outpatient Prescriptions on File Prior to Visit  Medication Sig Dispense Refill  . albuterol (PROVENTIL HFA;VENTOLIN HFA) 108 (90 BASE) MCG/ACT inhaler Inhale 1 puff into the lungs every 6 (six) hours as needed for wheezing or shortness of breath. 1 Inhaler 6  . ALPRAZolam (XANAX) 0.25 MG tablet TAKE 1 TABLET BY MOUTH 2 TIMES A DAY AS NEEDED 60 tablet 3  . amoxicillin (AMOXIL) 250 MG/5ML suspension Take 5 mLs (250 mg total) by mouth 3 (three) times daily. 150 mL 0  . benzonatate (TESSALON) 100 MG capsule Take 1 capsule (100 mg total) by mouth every 6 (six) hours as needed for cough. 30 capsule 1  . cetirizine (ZYRTEC) 10 MG tablet Take 1 tablet (10 mg total) by mouth daily. 30 tablet 5  . chlorpheniramine-HYDROcodone (TUSSIONEX) 10-8 MG/5ML LQCR Take 5 mLs by mouth every 12 (twelve) hours as needed for cough. 115 mL 0  . clopidogrel (PLAVIX) 75 MG tablet TAKE 1 TABLET (75 MG TOTAL) BY MOUTH EVERY MORNING. 30 tablet 5  . Cyanocobalamin (VITAMIN B-12 IJ) Inject as directed every 30 (thirty) days.    [name] esomeprazole (NEXIUM) 40 MG capsule Take 1 capsule (40 mg total) by mouth  daily at 12 noon. 30 capsule 6  . estrogens, conjugated, (PREMARIN) 0.625 MG tablet Take 0.625 mg by mouth every evening.    . fluticasone (FLONASE) 50 MCG/ACT nasal spray Place 2 sprays into both nostrils daily.    . furosemide (LASIX) 20 MG tablet TAKE 1 TABLET BY MOUTH ONCE A DAY AS NEEDED 30 tablet 5  . HYDROcodone-acetaminophen (NORCO/VICODIN) 5-325 MG per tablet Take 1 tablet by mouth 2 (two) times daily as needed for moderate pain. 60 tablet 0  . HYDROcodone-acetaminophen (NORCO/VICODIN) 5-325 MG per tablet Take 1 tablet by mouth 2 (two) times daily as needed for moderate pain. 60 tablet 0  . hydroxypropyl methylcellulose (ISOPTO TEARS) 2.5 % ophthalmic solution Place 1 drop into both eyes 3 (three) times daily as needed for dry eyes.    . hyoscyamine (LEVSIN SL) 0.125 MG SL tablet Place 1 tablet (0.125 mg total) under the tongue every 4 (four) hours as needed. 30 tablet 0  . ibuprofen (ADVIL,MOTRIN) 200 MG tablet Take 400-600 mg by mouth every 6 (six) hours as needed for mild pain or moderate pain.    [name] ipratropium-albuterol (DUONEB) 0.5-2.5 (3) MG/3ML SOLN Take 3 mLs by nebulization every 4 (four) hours as needed. Dx 496 360 mL 2  . montelukast (SINGULAIR) 10 MG tablet TAKE 1  TABLET BY MOUTH EVERY DAY 90 tablet 1  . PREMARIN 0.625 MG tablet TAKE 1 TABLET BY MOUTH DAILY FOR 30 DAYS 30 tablet 5  . sodium chloride (OCEAN) 0.65 % SOLN nasal spray Place 1 spray into both nostrils as needed for congestion.     No current facility-administered medications on file prior to visit.

## 2014-04-07 NOTE — Telephone Encounter (Signed)
Per SN- Augmentin 250/62.5mg  per 88ml take 2tsp BID x 10 days , #246ml. Align OTC 1 tab daily Pred dosepak 5mg , 6 day  Called and spoke to pt. Informed pt of the recs per SN. Rx sent to preferred pharmacy. Pt requested an appt with BQ to f/u with her recent s/s. Appt made for 04/30/14 with BQ in [place]. Pt verbalized understanding and denied any further questions or concerns at this time.

## 2014-04-07 NOTE — Addendum Note (Signed)
Addended by: Maurice March on: 04/07/2014 01:29 PM   Modules accepted: Orders

## 2014-04-14 ENCOUNTER — Ambulatory Visit (INDEPENDENT_AMBULATORY_CARE_PROVIDER_SITE_OTHER): Payer: Medicare Other | Admitting: *Deleted

## 2014-04-14 DIAGNOSIS — E538 Deficiency of other specified B group vitamins: Secondary | ICD-10-CM

## 2014-04-14 MED ORDER — CYANOCOBALAMIN 1000 MCG/ML IJ SOLN
1000.0000 ug | Freq: Once | INTRAMUSCULAR | Status: AC
  Administered 2014-04-14: 1000 ug via INTRAMUSCULAR

## 2014-04-16 ENCOUNTER — Other Ambulatory Visit: Payer: Self-pay

## 2014-04-16 MED ORDER — ALBUTEROL SULFATE HFA 108 (90 BASE) MCG/ACT IN AERS: 2.0000 | INHALATION_SPRAY | Freq: Four times a day (QID) | RESPIRATORY_TRACT | Status: DC | PRN

## 2014-04-28 ENCOUNTER — Encounter: Payer: Self-pay | Admitting: Internal Medicine

## 2014-04-28 ENCOUNTER — Ambulatory Visit (INDEPENDENT_AMBULATORY_CARE_PROVIDER_SITE_OTHER): Payer: Medicare Other | Admitting: Internal Medicine

## 2014-04-28 VITALS — BP 111/76 | HR 69 | Temp 97.5°F | Ht 63.0 in | Wt 107.5 lb

## 2014-04-28 DIAGNOSIS — G894 Chronic pain syndrome: Secondary | ICD-10-CM | POA: Insufficient documentation

## 2014-04-28 DIAGNOSIS — H539 Unspecified visual disturbance: Secondary | ICD-10-CM

## 2014-04-28 DIAGNOSIS — J449 Chronic obstructive pulmonary disease, unspecified: Secondary | ICD-10-CM

## 2014-04-28 DIAGNOSIS — K224 Dyskinesia of esophagus: Secondary | ICD-10-CM

## 2014-04-28 DIAGNOSIS — J8410 Pulmonary fibrosis, unspecified: Secondary | ICD-10-CM

## 2014-04-28 LAB — COMPREHENSIVE METABOLIC PANEL
ALT: 12 U/L (ref 0–35)
AST: 17 U/L (ref 0–37)
Albumin: 4.2 g/dL (ref 3.5–5.2)
Alkaline Phosphatase: 79 U/L (ref 39–117)
BUN: 15 mg/dL (ref 6–23)
CO2: 30 mEq/L (ref 19–32)
Calcium: 9.6 mg/dL (ref 8.4–10.5)
Chloride: 104 mEq/L (ref 96–112)
Creatinine, Ser: 1.01 mg/dL (ref 0.40–1.20)
GFR: 58.53 mL/min — ABNORMAL LOW (ref >60.00)
Glucose, Bld: 80 mg/dL (ref 70–99)
Potassium: 3.8 mEq/L (ref 3.5–5.1)
Sodium: 140 mEq/L (ref 135–145)
Total Bilirubin: 0.6 mg/dL (ref 0.2–1.2)
Total Protein: 6.8 g/dL (ref 6.0–8.3)

## 2014-04-28 LAB — VITAMIN B12: Vitamin B-12: 621 pg/mL (ref 211–911)

## 2014-04-28 LAB — TSH: TSH: 1.28 u[IU]/mL (ref 0.35–4.50)

## 2014-04-28 MED ORDER — DULOXETINE HCL 30 MG PO CPEP: 30.0000 mg | ORAL_CAPSULE | Freq: Every day | ORAL | Status: DC

## 2014-04-28 MED ORDER — ALPRAZOLAM 0.25 MG PO TABS: ORAL_TABLET | ORAL | Status: DC

## 2014-04-28 MED ORDER — CETIRIZINE HCL 10 MG PO TABS: 10.0000 mg | ORAL_TABLET | Freq: Every day | ORAL | Status: DC

## 2014-04-28 NOTE — Assessment & Plan Note (Addendum)
Recent visual changes, described as blurred vision and trouble focusing. Eye exam reportedly normal. Will check thyroid function with labs. If symptoms persist, consider MRI Brain.

## 2014-04-28 NOTE — Assessment & Plan Note (Signed)
Chronic pain and fatigue. Likely multifactorial. Rheumatology workup negative. Will try adding Cymbalta. Follow up in 4 weeks and prn. Discussed referral to pain management if symptoms not improving.

## 2014-04-28 NOTE — Assessment & Plan Note (Signed)
Wt Readings from Last 3 Encounters:  04/28/14 107 lb 8 oz (48.762 kg)  01/29/14 108 lb (48.988 kg)  01/23/14 106 lb 12 oz (48.421 kg)   Weight stable. Encouraged her to avoid excessive use of NSAIDS. Continue Nexium. Follow up with GI as scheduled.

## 2014-04-28 NOTE — Patient Instructions (Signed)
Labs today.  Start Cymbalta 30mg  daily to help with chronic pain.  Follow up in 4 weeks.

## 2014-04-28 NOTE — Progress Notes (Signed)
Pre visit review using our clinic review tool, if applicable. No additional management support is needed unless otherwise documented below in the visit note. 

## 2014-04-28 NOTE — Assessment & Plan Note (Signed)
Symptomatically, she appears to be doing well. Follow up pending with pulmonary medicine tomorrow. Will continue inhaled Duoneb.

## 2014-04-28 NOTE — Progress Notes (Signed)
Subjective:    Patient ID: Anne [name], female    DOB: 10-Feb-1951, 64 y.o.   MRN: [id number]  HPI  64YO female presents for follow up.  Recently treated with Augmentin and Prednisone for increased cough, dyspnea. Symptoms of dyspnea have improved some. Continues to have occasional cough. Scheduled to see Dr. Lake Bells this week.  Having some blurred vision in both eyes. Feels like "smoke screen over eyes." Followed by Dr. Thomasene Ripple and Dr. Oval Linsey. Had cataract removal in left eye. She reports that recent eye exams have been normal. She questions if thyroid function may be contributing.  Having chronic diffuse joint pain. Seen by Dr. Jefm Bryant and markers of inflammation were negative. Taking Ibuprofen 800mg  several times per day. At times, takes Hydrocodone for pain with some improvement. Notes some sensitivity to cold. Also notes some tingling in feet. Treated with muscle relaxants in past with no improvement.  Wt Readings from Last 3 Encounters:  04/28/14 107 lb 8 oz (48.762 kg)  01/29/14 108 lb (48.988 kg)  01/23/14 106 lb 12 oz (48.421 kg)    Past medical, surgical, family and social history per today's encounter.  Review of Systems  Constitutional: Positive for fatigue. Negative for fever, chills, appetite change and unexpected weight change.  Eyes: Positive for visual disturbance.  Respiratory: Positive for cough and shortness of breath.   Cardiovascular: Negative for chest pain and leg swelling.  Gastrointestinal: Negative for abdominal pain, diarrhea and constipation.  Musculoskeletal: Positive for myalgias and arthralgias.  Skin: Negative for color change and rash.  Hematological: Negative for adenopathy. Does not bruise/bleed easily.  Psychiatric/Behavioral: Negative for sleep disturbance and dysphoric mood. The patient is not nervous/anxious.        Objective:    BP 111/76 mmHg  Pulse 69  Temp(Src) 97.5 F (36.4 C) (Oral)  Ht 5\' 3"  (1.6 m)  Wt 107 lb 8 oz  (48.762 kg)  BMI 19.05 kg/m2  SpO2 100% Physical Exam  Constitutional: She is oriented to person, place, and time. She appears well-developed and well-nourished. No distress.  HENT:  Head: Normocephalic and atraumatic.  Right Ear: External ear normal.  Left Ear: External ear normal.  Nose: Nose normal.  Mouth/Throat: Oropharynx is clear and moist. No oropharyngeal exudate.  Eyes: Conjunctivae are normal. Pupils are equal, round, and reactive to light. Right eye exhibits no discharge. Left eye exhibits no discharge. No scleral icterus.  Neck: Normal range of motion. Neck supple. No tracheal deviation present. No thyromegaly present.  Cardiovascular: Normal rate, regular rhythm, normal heart sounds and intact distal pulses.  Exam reveals no gallop and no friction rub.   No murmur heard. Pulmonary/Chest: Effort normal and breath sounds normal. No respiratory distress. She has no wheezes. She has no rales. She exhibits no tenderness.  Musculoskeletal: Normal range of motion. She exhibits no edema or tenderness.  Lymphadenopathy:    She has no cervical adenopathy.  Neurological: She is alert and oriented to person, place, and time. No cranial nerve deficit. She exhibits normal muscle tone. Coordination normal.  Skin: Skin is warm and dry. No rash noted. She is not diaphoretic. No erythema. No pallor.  Psychiatric: She has a normal mood and affect. Her behavior is normal. Judgment and thought content normal.          Assessment & Plan:   Problem List Items Addressed This Visit      Unprioritized   Chronic pain syndrome    Chronic pain and fatigue. Likely multifactorial. Rheumatology workup  negative. Will try adding Cymbalta. Follow up in 4 weeks and prn. Discussed referral to pain management if symptoms not improving.      Relevant Medications   DULoxetine (CYMBALTA) DR capsule   COPD with asthma - Primary    Symptomatically, she appears to be doing well. Follow up pending with  pulmonary medicine tomorrow. Will continue inhaled Duoneb.      Relevant Medications   cetirizine (ZYRTEC) tablet   Esophageal dysmotility    Wt Readings from Last 3 Encounters:  04/28/14 107 lb 8 oz (48.762 kg)  01/29/14 108 lb (48.988 kg)  01/23/14 106 lb 12 oz (48.421 kg)   Weight stable. Encouraged her to avoid excessive use of NSAIDS. Continue Nexium. Follow up with GI as scheduled.      Postinflammatory pulmonary fibrosis   Visual changes    Recent visual changes, described as blurred vision and trouble focusing. Eye exam reportedly normal. Will check thyroid function with labs. If symptoms persist, consider MRI Brain.      Relevant Orders   Comprehensive metabolic panel   TSH   B52       Return in about 4 weeks (around 05/26/2014) for Recheck.

## 2014-04-30 ENCOUNTER — Encounter: Payer: Self-pay | Admitting: Internal Medicine

## 2014-04-30 ENCOUNTER — Ambulatory Visit (INDEPENDENT_AMBULATORY_CARE_PROVIDER_SITE_OTHER): Payer: Medicare Other | Admitting: Pulmonary Disease

## 2014-04-30 ENCOUNTER — Encounter: Payer: Self-pay | Admitting: Pulmonary Disease

## 2014-04-30 VITALS — BP 128/64 | HR 71 | Ht 63.0 in | Wt 106.0 lb

## 2014-04-30 DIAGNOSIS — R059 Cough, unspecified: Secondary | ICD-10-CM

## 2014-04-30 DIAGNOSIS — J019 Acute sinusitis, unspecified: Secondary | ICD-10-CM

## 2014-04-30 DIAGNOSIS — J449 Chronic obstructive pulmonary disease, unspecified: Secondary | ICD-10-CM

## 2014-04-30 DIAGNOSIS — J8410 Pulmonary fibrosis, unspecified: Secondary | ICD-10-CM

## 2014-04-30 DIAGNOSIS — R05 Cough: Secondary | ICD-10-CM

## 2014-04-30 DIAGNOSIS — J0120 Acute ethmoidal sinusitis, unspecified: Secondary | ICD-10-CM

## 2014-04-30 DIAGNOSIS — J329 Chronic sinusitis, unspecified: Secondary | ICD-10-CM | POA: Insufficient documentation

## 2014-04-30 MED ORDER — HYDROCOD POLST-CHLORPHEN POLST 10-8 MG/5ML PO LQCR: 5.0000 mL | Freq: Two times a day (BID) | ORAL | Status: DC | PRN

## 2014-04-30 NOTE — Assessment & Plan Note (Signed)
Again, no clear evidence of progression clinically. As stated in multiple previous notes if there does appear to be progression radiographically in the absence of clear aspiration then we could consider an open lung biopsy. At this time her symptoms are stable.

## 2014-04-30 NOTE — Patient Instructions (Signed)
Take the tussionex as needed for the cough Use saline sprays to help with the sinus congestion, use pseudophed for 2-3 days to see if it helps with the congestion We will call you with the results of the Chest X-ray and CT sinuses  We will see you back in 4-6 weeks or sooner if needed

## 2014-04-30 NOTE — Assessment & Plan Note (Addendum)
" >>  ASSESSMENT AND PLAN FOR ASTHMA-COPD OVERLAP SYNDROME (HCC) WRITTEN ON 04/30/2014  6:23 PM BY Steward Sames B, MD  Anne [name] has complained of cough and wheezing recently but on exam today she normal sounding lungs. She only wheezes when she breathes through her vocal cords which I believe is directly related to vocal cord dysfunction. As usual, I was able to make the wheeze go away by removing the cap on her tracheostomy.  Plan: -I see no reason for treatment with long acting bronchodilators or corticosteroids  -use when necessary albuterol  -obtain CXR to ensure no recurrence of aspiration pneumonia -treat cough conservatively -work up for sinusitis   >>ASSESSMENT AND PLAN FOR PULMONARY FIBROSIS (HCC) WRITTEN ON 04/30/2014  6:19 PM BY Ethelle Ola B, MD  Again, no clear evidence of progression clinically. As stated in multiple previous notes if there does appear to be progression radiographically in the absence of clear aspiration then we could consider an open lung biopsy. At this time her symptoms are stable. "

## 2014-04-30 NOTE — Progress Notes (Signed)
Subjective:    Patient ID: Anne [name], female    DOB: 1950-08-08, 64 y.o.   MRN: [id number]  Synopsis: She enjoys establish care with the [facility] pulmonary clinic in may of 2014. 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 A CT scan in 2013 performed at [facility] showed some groundglass changes, interstitial thickening, and interlobular thickening in the upper lobes predominantly.  This had progressed since 2011.  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  She was also evaluated by [facility] in 2014 and was found to have significant esophageal dysmotility and increased LES tone.  She has had a J tube on two separate occasions in the past.   On 07/29/2013 she had lower esophageal Botox injections.  HPI   Chief Complaint  Patient presents with  . Follow-up    pt c/o sob with exertion, soreness around trach.     Raynette say she has been on an antibiotic three times since the last visit.  She was given a prescription for antibiotic three times by phone since the last visit and has not seen a doctor for sinus congestion, wheezing, chest congestion and body aches.  She says that the eating has been OK recently and she has not been choking on food much lately.  She has cough which is dry.  She has noticed yellow mucus sometime around her trach.  She has soreness in and around the trach site and it burns a little bit.  She has been using her inhaler more often since  the last visit.  She said that yesterday she was feeling more dyspnea with vacuuming.  She has tussionex at home but she hasn't taken it since the last visit.    Past Medical History  Diagnosis Date  . Asthma   . COPD (chronic obstructive pulmonary disease)   . Stroke     slurred speech, drawn face, imaging normal, occurred twice, [facility]  . Migraine   . Allergic rhinitis   . Anemia   . IBS (irritable bowel syndrome)   . Shingles   . Tracheostomy in place   . Complication of anesthesia     Breathing problems  . Pulmonary fibrosis   . Vocal cord paresis   . Heart murmur     as child     Review of Systems  Constitutional: Negative for fever, chills and fatigue.  HENT: Negative for congestion, postnasal drip, rhinorrhea, sinus pressure and sore throat.   Respiratory: Positive for shortness of breath. Negative for cough and chest tightness.   Cardiovascular: Negative for chest pain, palpitations and leg swelling.       Objective:   Physical Exam Filed Vitals:   04/30/14 1402  BP: 128/64  Pulse: 71  Height: _0  (1.6 m)  Weight: 106 lb (48.081 kg)  SpO2: 100%  RA  06/18/2013 Ambulated 500 feet on RA and O2 saturation never dropped less than 97%  Gen:  Well appearing, no acute distress HEENT: NCAT, EOMi, OP clear,  PULM: Only upper airway wheezing that clears when the trach is not capped CV: RRR, no mgr, no JVD AB: BS+, soft, nontender, Ext: warm, no edema, no clubbing, no cyanosis Neuro: Awake and alert   [facility]/2011 [facility] emphysema bilaterally; upper lobe groundglass opacification, interstitial thickening, interlobular thickening, and apical capping noted bilaterally. This is significantly worsened since the 2007 comparison study. 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 She only wheezes when she breathes through her vocal cords which I believe is directly related to vocal cord dysfunction. As usual, I was able to make the wheeze go away by removing the cap on her tracheostomy.  Plan: -I see no reason for treatment with long acting bronchodilators or corticosteroids  -use when necessary albuterol   Postinflammatory pulmonary fibrosis Again, no clear evidence of progression clinically. As stated in multiple previous notes if there does appear to be progression radiographically in the absence of clear aspiration then we could consider an open lung biopsy. At this time her symptoms are stable.   Acute sinusitis Lately [name] has complained of cough, wheezing, and sore throat. However, as detailed above she has no pulmonary abnormalities on physical exam. She does have what sounds like sinus congestion which has been persistent despite 3 rounds of antibiotics in the last few weeks. My concern is that she could have ongoing sinusitis. She is currently not treating that with any form of sinus toilet or decongestants.  Plan: -Obtain CT scan of the sinuses to see if there is objective evidence of sinusitis -I have recommended that she use saline rinses and continue the Flonase  -If she has  evidence of sinusitis on CT of her chest then I would recommend that she go back to [place] [facility] for sinus sampling for a culture considering all the antibiotic she has received recently.     Updated Medication List Outpatient Encounter Prescriptions as of 04/30/2014  Medication Sig  . albuterol (PROVENTIL HFA;VENTOLIN HFA) 108 (90 BASE) MCG/ACT inhaler Inhale 2 puffs into the lungs every 6 (six) hours as needed for wheezing or shortness of breath.  . ALPRAZolam (XANAX) 0.25 MG tablet TAKE 1 TABLET BY MOUTH 2 TIMES A DAY AS NEEDED  . benzonatate (TESSALON) 100 MG capsule Take 1 capsule (100 mg total) by mouth every 6 (six) hours as needed for cough.  . cetirizine (ZYRTEC) 10 MG tablet Take 1 tablet (10 mg total) by mouth daily.  . chlorpheniramine-HYDROcodone (TUSSIONEX) 10-8 MG/5ML LQCR Take 5 mLs by mouth every 12 (twelve) hours as needed for cough.  . clopidogrel (PLAVIX) 75 MG tablet TAKE 1 TABLET (  75 MG TOTAL) BY MOUTH EVERY MORNING.  . Cyanocobalamin (VITAMIN B-12 IJ) Inject as directed every 30 (thirty) days.  [name] esomeprazole (NEXIUM) 40 MG capsule Take 1 capsule (40 mg total) by mouth daily at 12 noon.  . estrogens, conjugated, (PREMARIN) 0.625 MG tablet Take 0.625 mg by mouth every evening.  . fluticasone (FLONASE) 50 MCG/ACT nasal spray Place 2 sprays into both nostrils daily.  . furosemide (LASIX) 20 MG tablet TAKE 1 TABLET BY MOUTH ONCE A DAY AS NEEDED  . HYDROcodone-acetaminophen (NORCO/VICODIN) 5-325 MG per tablet Take 1 tablet by mouth 2 (two) times daily as needed for moderate pain.  . hydroxypropyl methylcellulose (ISOPTO TEARS) 2.5 % ophthalmic solution Place 1 drop into both eyes 3 (three) times daily as needed for dry eyes.  . hyoscyamine (LEVSIN SL) 0.125 MG SL tablet Place 1 tablet (0.125 mg total) under the tongue every 4 (four) hours as needed.  [name] ibuprofen (ADVIL,MOTRIN) 200 MG tablet Take 400-600 mg by mouth every 6 (six) hours as needed for mild pain or  moderate pain.  [name] ipratropium-albuterol (DUONEB) 0.5-2.5 (3) MG/3ML SOLN Take 3 mLs by nebulization every 4 (four) hours as needed. Dx 496  . montelukast (SINGULAIR) 10 MG tablet TAKE 1 TABLET BY MOUTH EVERY DAY  . mupirocin ointment (BACTROBAN) 2 % Place 1 application into the nose 2 (two) times daily as needed.  [name] PREMARIN 0.625 MG tablet TAKE 1 TABLET BY MOUTH DAILY FOR 30 DAYS  . sodium chloride (OCEAN) 0.65 % SOLN nasal spray Place 1 spray into both nostrils as needed for congestion.  . [DISCONTINUED] chlorpheniramine-HYDROcodone (TUSSIONEX) 10-8 MG/5ML LQCR Take 5 mLs by mouth every 12 (twelve) hours as needed for cough.

## 2014-04-30 NOTE — Assessment & Plan Note (Signed)
Lately Anne [name] has complained of cough, wheezing, and sore throat. However, as detailed above she has no pulmonary abnormalities on physical exam. She does have what sounds like sinus congestion which has been persistent despite 3 rounds of antibiotics in the last few weeks. My concern is that she could have ongoing sinusitis. She is currently not treating that with any form of sinus toilet or decongestants.  Plan: -Obtain CT scan of the sinuses to see if there is objective evidence of sinusitis -I have recommended that she use saline rinses and continue the Flonase  -If she has evidence of sinusitis on CT of her chest then I would recommend that she go back to [place] [facility] for sinus sampling for a culture considering all the antibiotic she has received recently.

## 2014-05-05 ENCOUNTER — Ambulatory Visit (INDEPENDENT_AMBULATORY_CARE_PROVIDER_SITE_OTHER)
Admission: RE | Admit: 2014-05-05 | Discharge: 2014-05-05 | Disposition: A | Discharge status: Home / Self Care | Payer: Medicare Other | Source: Ambulatory Visit | Attending: Pulmonary Disease | Admitting: Pulmonary Disease

## 2014-05-05 DIAGNOSIS — R05 Cough: Secondary | ICD-10-CM

## 2014-05-05 DIAGNOSIS — R059 Cough, unspecified: Secondary | ICD-10-CM

## 2014-05-06 ENCOUNTER — Ambulatory Visit: Payer: Self-pay | Admitting: Pulmonary Disease

## 2014-05-06 NOTE — Progress Notes (Signed)
Quick Note:  Pt aware of results. ______ 

## 2014-05-07 ENCOUNTER — Telehealth: Payer: Self-pay | Admitting: Pulmonary Disease

## 2014-05-07 MED ORDER — PREDNISONE 10 MG PO TABS: ORAL_TABLET | ORAL | Status: DC

## 2014-05-07 NOTE — Telephone Encounter (Signed)
Please request the results of the CT scan She usually does well with prednisone, as I told her in clinic I don't want to give her any more antibiotics.  Please don't let anyone else from our practice call in antibiotics.  Offer prednisone: Take 40mg  po daily for 3 days, then take 30mg  po daily for 3 days, then take 20mg  po daily for two days, then take 10mg  po daily for 2 days  If the CT scan shows sinusitis then she needs to get in to see [place] [facility] for a sinus culture as she has been treated with multiple antibiotics recently

## 2014-05-07 NOTE — Telephone Encounter (Signed)
lmomtcb x1 for pt 

## 2014-05-07 NOTE — Telephone Encounter (Signed)
CT sinus results in BQ's lookat  The report states that it was a negative exam  I advised her of recs per BQ  Pred was sent to her pharmacy

## 2014-05-07 NOTE — Telephone Encounter (Signed)
Pt returning call.Anne [name] ° °

## 2014-05-07 NOTE — Telephone Encounter (Signed)
Per 04/30/14 OV: Take the tussionex as needed for the cough Use saline sprays to help with the sinus congestion, use pseudophed for 2-3 days to see if it helps with the congestion We will call you with the results of the Chest X-ray and CT sinuses  We will see you back in 4-6 weeks or sooner if needed --  Called pt and she reports she had sinus scan done yesterday at [facility]. She c/o increase SOB, nasal congestion, PND. She is wanting something called in. Please advise BQ thanks  Allergies  Allergen Reactions  . Shellfish Allergy Anaphylaxis  . Quinolones     Feet swell  . Asa [Aspirin]     Tongue swelling  . Avelox [Moxifloxacin]     Increased heart rate  . Ciprofloxacin Swelling    ALL OF THE "FLOXACIN"  . Eggs Or Egg-Derived Products     GI Upset  . Influenza Vaccines   . Levaquin [Levofloxacin In D5w]     Aches and swelling  . Septra [Sulfamethoxazole-Trimethoprim]     Mouth sores

## 2014-05-08 NOTE — Telephone Encounter (Signed)
Pt aware of CT sinus results.

## 2014-05-08 NOTE — Telephone Encounter (Signed)
Please let her know that her CT sinuses was normal

## 2014-05-13 ENCOUNTER — Telehealth: Payer: Self-pay | Admitting: *Deleted

## 2014-05-13 NOTE — Telephone Encounter (Signed)
PA completed online, pending response. 

## 2014-05-13 NOTE — Telephone Encounter (Signed)
Fax from [facility], needing PA for Premarin 0.625 mg. Started online. Sent mychart for more information.

## 2014-05-14 ENCOUNTER — Encounter: Payer: Self-pay | Admitting: Internal Medicine

## 2014-05-14 ENCOUNTER — Encounter: Payer: Self-pay | Admitting: *Deleted

## 2014-05-14 ENCOUNTER — Telehealth: Payer: Self-pay | Admitting: *Deleted

## 2014-05-14 NOTE — Telephone Encounter (Signed)
Sent MyChart message notifying pt

## 2014-05-14 NOTE — Telephone Encounter (Signed)
Contacted pt about prior authorization for Premarin.  Asked pt if she would consider stopping the estrogen, Pt states yes, asking if there is anything she needs to know about coming off of it? Pt states that she never had hot flashes, she was basically on the medication for sex and vaginal dryness.  Is there something over the counter that she could use?

## 2014-05-14 NOTE — Telephone Encounter (Signed)
We can try using silicone based lubricant to help with pain during intercourse. That would be much safer than oral estrogen.  She does not need to taper. She can just stop the medication. If she develops worsening symptoms of vaginal dryness or hot flashes, then we can discuss some other alternatives.

## 2014-05-19 ENCOUNTER — Ambulatory Visit (INDEPENDENT_AMBULATORY_CARE_PROVIDER_SITE_OTHER): Payer: Medicare Other | Admitting: *Deleted

## 2014-05-19 DIAGNOSIS — E538 Deficiency of other specified B group vitamins: Secondary | ICD-10-CM

## 2014-05-19 MED ORDER — CYANOCOBALAMIN 1000 MCG/ML IJ SOLN
1000.0000 ug | Freq: Once | INTRAMUSCULAR | Status: AC
  Administered 2014-05-19: 1000 ug via INTRAMUSCULAR

## 2014-05-25 ENCOUNTER — Telehealth: Payer: Self-pay | Admitting: Internal Medicine

## 2014-05-25 NOTE — Telephone Encounter (Signed)
Last OV 2.2.16.  Advise refill

## 2014-05-25 NOTE — Telephone Encounter (Signed)
ALPRAZolam (XANAX) 0.25 MG tablet  HYDROcodone-acetaminophen (NORCO/VICODIN) 5-325 MG

## 2014-05-26 ENCOUNTER — Other Ambulatory Visit: Payer: Self-pay | Admitting: *Deleted

## 2014-05-26 ENCOUNTER — Ambulatory Visit: Payer: Medicare Other | Admitting: Internal Medicine

## 2014-05-26 MED ORDER — HYDROCODONE-ACETAMINOPHEN 5-325 MG PO TABS: 1.0000 | ORAL_TABLET | Freq: Two times a day (BID) | ORAL | Status: DC | PRN

## 2014-05-26 NOTE — Telephone Encounter (Signed)
Fine to refill 

## 2014-05-26 NOTE — Telephone Encounter (Signed)
Pt.notified

## 2014-05-28 ENCOUNTER — Encounter: Payer: Self-pay | Admitting: Pulmonary Disease

## 2014-06-03 ENCOUNTER — Ambulatory Visit (INDEPENDENT_AMBULATORY_CARE_PROVIDER_SITE_OTHER): Payer: Medicare Other | Admitting: Internal Medicine

## 2014-06-03 ENCOUNTER — Encounter: Payer: Self-pay | Admitting: Internal Medicine

## 2014-06-03 VITALS — BP 94/61 | HR 68 | Temp 97.6°F | Ht 63.0 in | Wt 106.0 lb

## 2014-06-03 DIAGNOSIS — J449 Chronic obstructive pulmonary disease, unspecified: Secondary | ICD-10-CM | POA: Diagnosis not present

## 2014-06-03 DIAGNOSIS — Z8673 Personal history of transient ischemic attack (TIA), and cerebral infarction without residual deficits: Secondary | ICD-10-CM | POA: Diagnosis not present

## 2014-06-03 DIAGNOSIS — G894 Chronic pain syndrome: Secondary | ICD-10-CM | POA: Diagnosis not present

## 2014-06-03 NOTE — Progress Notes (Signed)
Pre visit review using our clinic review tool, if applicable. No additional management support is needed unless otherwise documented below in the visit note. 

## 2014-06-03 NOTE — Patient Instructions (Signed)
We will set up an evaluation with rheumatology.  Follow up in 3 months.

## 2014-06-03 NOTE — Assessment & Plan Note (Signed)
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 MRI from 04/2013 was normal. She was started on Plavix by neurology after her TIA, as she is unable to tolerate aspirin. She would like to come off the medication. We discussed pros and cons of stopping Plavix. She will also consider referral back to neurology to discuss.

## 2014-06-03 NOTE — Assessment & Plan Note (Signed)
Symptomatically doing well over last few months. Continue inhaled bronchodilators. Follow up with pulmonary as scheduled.

## 2014-06-03 NOTE — Progress Notes (Signed)
Subjective:    Patient ID: Anne [name], female    DOB: 1950-07-14, 64 y.o.   MRN: [id number]  HPI 64YO female presents for follow up.  Last seen 2/2 with multiple complains including chronic pain and COPD.  Chronic pain - Last visit, recommended Cymbalta, however she decided not to take this. Continues to use prn Hydrocodone for pain. Started taking Tumeric and Black Cohash. Continues to have chronic pain in hips, neck.  Concerned about taking Plavix long term. Started on Plavix 5-10 years ago for question of CVA, which occurred 10 years ago. Unable to take aspirin. MRI brain from 04/2013 was normal.  Past medical, surgical, family and social history per today's encounter.  Review of Systems  Constitutional: Negative for fever, chills, appetite change, fatigue and unexpected weight change.  Eyes: Negative for visual disturbance.  Respiratory: Negative for shortness of breath.   Cardiovascular: Negative for chest pain and leg swelling.  Gastrointestinal: Negative for vomiting, abdominal pain, diarrhea and constipation.  Musculoskeletal: Positive for myalgias and arthralgias.  Skin: Negative for color change and rash.  Neurological: Positive for headaches.  Hematological: Negative for adenopathy. Does not bruise/bleed easily.  Psychiatric/Behavioral: Positive for sleep disturbance. Negative for suicidal ideas and dysphoric mood. The patient is not nervous/anxious.        Objective:    BP 94/61 mmHg  Pulse 68  Temp(Src) 97.6 F (36.4 C) (Oral)  Ht 5\' 3"  (1.6 m)  Wt 106 lb (48.081 kg)  BMI 18.78 kg/m2  SpO2 100% Physical Exam  Constitutional: She is oriented to person, place, and time. She appears well-developed and well-nourished. No distress.  HENT:  Head: Normocephalic and atraumatic.    Right Ear: External ear normal.  Left Ear: External ear normal.  Nose: Nose normal.  Mouth/Throat: Oropharynx is clear and moist. No oropharyngeal exudate.  Eyes: Conjunctivae  are normal. Pupils are equal, round, and reactive to light. Right eye exhibits no discharge. Left eye exhibits no discharge. No scleral icterus.  Neck: Normal range of motion. Neck supple. No tracheal deviation present. No thyromegaly present.  Cardiovascular: Normal rate, regular rhythm, normal heart sounds and intact distal pulses.  Exam reveals no gallop and no friction rub.   No murmur heard. Pulmonary/Chest: Effort normal and breath sounds normal. No respiratory distress. She has no wheezes. She has no rales. She exhibits no tenderness.  Musculoskeletal: Normal range of motion. She exhibits no edema or tenderness.  Lymphadenopathy:    She has no cervical adenopathy.  Neurological: She is alert and oriented to person, place, and time. No cranial nerve deficit. She exhibits normal muscle tone. Coordination normal.  Skin: Skin is warm and dry. No rash noted. She is not diaphoretic. No erythema. No pallor.  Psychiatric: She has a normal mood and affect. Her behavior is normal. Judgment and thought content normal.          Assessment & Plan:   Problem List Items Addressed This Visit      Unprioritized   Chronic pain syndrome - Primary    Chronic pain and fatigue. As noted in past, likely multifactorial. Evaluation by local rheumatologist negative for inflammatory arthropathy. Declines use of Cymbalta. Pilar Plate discussion about her tolerance to Hydrocodone and risks of long-term use. Will need referral to pain management if she would like to stay on this medication. She has opted to try Tumeric as a more "natural" approach. Will also set up second opinion with rheumatology at [facility].  Over 88min of which >50% spent  in face-to-face contact with patient discussing plan of care       Relevant Orders   Ambulatory referral to Rheumatology   COPD with asthma    Symptomatically doing well over last few months. Continue inhaled bronchodilators. Follow up with pulmonary as scheduled.      History  of TIA (transient ischemic attack)    She reports history of 2 TIAs 8-10 years ago, with symptoms of slurred speech, facial droop. MRI from 04/2013 was normal. She was started on Plavix by neurology after her TIA, as she is unable to tolerate aspirin. She would like to come off the medication. We discussed pros and cons of stopping Plavix. She will also consider referral back to neurology to discuss.          Return in about 3 months (around 09/03/2014).

## 2014-06-03 NOTE — Assessment & Plan Note (Signed)
Chronic pain and fatigue. As noted in past, likely multifactorial. Evaluation by local rheumatologist negative for inflammatory arthropathy. Declines use of Cymbalta. Pilar Plate discussion about her tolerance to Hydrocodone and risks of long-term use. Will need referral to pain management if she would like to stay on this medication. She has opted to try Tumeric as a more "natural" approach. Will also set up second opinion with rheumatology at [facility].  Over 85min of which >50% spent in face-to-face contact with patient discussing plan of care

## 2014-06-10 ENCOUNTER — Encounter: Payer: Self-pay | Admitting: Internal Medicine

## 2014-06-17 ENCOUNTER — Ambulatory Visit: Payer: Medicare Other | Admitting: Pulmonary Disease

## 2014-06-19 ENCOUNTER — Encounter: Payer: Self-pay | Admitting: Internal Medicine

## 2014-06-23 ENCOUNTER — Telehealth: Payer: Self-pay | Admitting: *Deleted

## 2014-06-23 ENCOUNTER — Encounter: Payer: Self-pay | Admitting: Internal Medicine

## 2014-06-23 ENCOUNTER — Ambulatory Visit (INDEPENDENT_AMBULATORY_CARE_PROVIDER_SITE_OTHER): Payer: Medicare Other | Admitting: *Deleted

## 2014-06-23 ENCOUNTER — Ambulatory Visit: Payer: Medicare Other

## 2014-06-23 DIAGNOSIS — R5382 Chronic fatigue, unspecified: Secondary | ICD-10-CM

## 2014-06-23 MED ORDER — CYANOCOBALAMIN 1000 MCG/ML IJ SOLN
1000.0000 ug | Freq: Once | INTRAMUSCULAR | Status: AC
  Administered 2014-06-23: 1000 ug via INTRAMUSCULAR

## 2014-06-23 NOTE — Telephone Encounter (Signed)
Patient seen in clinic for monthly B 12 injection, patient question an E-mail sent to MD concerning an appointment with rheumatology, patient does not understand has appointment been cancelled or is she to see rheumatology. Patient stated she has tried to call several times and has been left on hold with no answer, patient just hung-up after 15 minutes with no answer.  Patient would like a second opinion as to rheumatology instead of chronic pain issue, patient stated she would like to make sure it is no type of inflammation or rheumatologic disorder causing her pain. Please advise.

## 2014-06-23 NOTE — Telephone Encounter (Signed)
Rheumatology at [facility] refused to see pt as she has no known rheumatological diagnosis, only chronic pain. We can try to refer locally if she would like. I think Bonnita Nasuti may have discussed this with her.

## 2014-06-25 ENCOUNTER — Telehealth: Payer: Self-pay | Admitting: Pulmonary Disease

## 2014-06-25 NOTE — Telephone Encounter (Signed)
I called spoke with pt. She reports she receive some bllod work on her [facility] chart from 04/28/14. I advised her we did not order any lab tests on her that day for potassium. She is going to go back and look into this. Nothing further needed

## 2014-06-25 NOTE — Telephone Encounter (Signed)
Pt returning call, phone cutting off.Anne [name]

## 2014-06-25 NOTE — Telephone Encounter (Signed)
Spoke with the pt  The phone was breaking up  She will call back from another line

## 2014-07-02 ENCOUNTER — Other Ambulatory Visit: Admit: 2014-07-02 | Disposition: A | Discharge status: Home / Self Care | Payer: Self-pay | Admitting: Ophthalmology

## 2014-07-06 LAB — WOUND CULTURE

## 2014-07-10 ENCOUNTER — Telehealth: Payer: Self-pay | Admitting: Pulmonary Disease

## 2014-07-10 MED ORDER — PREDNISONE 10 MG PO TABS: ORAL_TABLET | ORAL | Status: DC

## 2014-07-10 NOTE — Telephone Encounter (Signed)
Prednisone called in. lmtcb X1 to make pt aware.

## 2014-07-10 NOTE — Telephone Encounter (Signed)
Spoke with pt. Reports chest congestion x2 days. Coughing with production of yellow mucus, chest tightness and SOB are present. Would like prednisone to be sent in.  BQ - please advise. Thanks.

## 2014-07-10 NOTE — Telephone Encounter (Signed)
Pt cb, informed her Prednisone was called into Pharm per [facility], pt verbalized understanding, nothing further needed.

## 2014-07-10 NOTE — Telephone Encounter (Signed)
Please take prednisone 40 mg x1 day, then 30 mg x1 day, then 20 mg x1 day, then 10 mg x1 day, and then 5 mg x1 day and stop  

## 2014-07-10 NOTE — Telephone Encounter (Signed)
BQ worked night shift last night. He is currently unavailable for messages.  MR - please advise. Thanks.

## 2014-07-15 ENCOUNTER — Encounter: Payer: Self-pay | Admitting: Pulmonary Disease

## 2014-07-15 ENCOUNTER — Ambulatory Visit (INDEPENDENT_AMBULATORY_CARE_PROVIDER_SITE_OTHER): Payer: Medicare Other | Admitting: Pulmonary Disease

## 2014-07-15 VITALS — BP 116/68 | HR 74 | Ht 63.0 in | Wt 108.0 lb

## 2014-07-15 DIAGNOSIS — J449 Chronic obstructive pulmonary disease, unspecified: Secondary | ICD-10-CM

## 2014-07-15 DIAGNOSIS — J8410 Pulmonary fibrosis, unspecified: Secondary | ICD-10-CM | POA: Diagnosis not present

## 2014-07-15 DIAGNOSIS — Z930 Tracheostomy status: Secondary | ICD-10-CM | POA: Diagnosis not present

## 2014-07-15 DIAGNOSIS — J383 Other diseases of vocal cords: Secondary | ICD-10-CM | POA: Diagnosis not present

## 2014-07-15 NOTE — Assessment & Plan Note (Signed)
She has mild scarring in the apices of her lungs which is due to chronic cough. This is been evaluated extensively with a bronchoscopy with biopsy and workup. There is no evidence of exacerbation based on a recent chest x-ray which I have reviewed.

## 2014-07-15 NOTE — Assessment & Plan Note (Signed)
I have recommended that this never be removed unless she has over 2 years of time with out doctor's visits for acute upper respiratory infections. It has literally saved her hospitalizations and possibly intubations because her vocal cord dysfunction minute's severe COPD and asthma severely. In fact I do not believe that she has COPD at all. She breathes fine even when sick when the tracheostomy has been uncapped.

## 2014-07-15 NOTE — Assessment & Plan Note (Signed)
I have come to the conclusion that Anne [name] has severe vocal cord dysfunction that contributes to her symptoms. As I have noted in multiple notes visits previously all of her wheezing and shortness of breath can be eliminated when I removed From her tracheostomy. I have never once heard her have wheezing from her lung fields alone. 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  She has some chronic scarring in the superior portions of her lungs which I evaluated with a bronchoscopy and biopsy. Fortunately this was negative. If there is evidence of progression of that than that would need to be evaluated with an open lung biopsy. However at this time I feel that all of her symptoms come from vocal cord problems. Primarily, I feel that this is vocal cord dysfunction given the intermittent nature.  Plan: -I recommended that the tracheostomy be removed ever -She can discuss this with her ear nose and throat physician -I recommended that she see the fully center at [facility] or at [facility] for therapy for vocal cord dysfunction  Greater than 30 minutes were spent in direct consultation with the patient today in clinic

## 2014-07-15 NOTE — Progress Notes (Signed)
Subjective:    Patient ID: Anne [name], female    DOB: 02-24-1951, 64 y.o.   MRN: [id number]  Synopsis: She enjoys establish care with the [facility] pulmonary clinic in may of 2014. 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 A CT scan in 2013 performed at [facility] showed some groundglass changes, interstitial thickening, and interlobular thickening in the upper lobes predominantly.  This had progressed since 2011.  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  She was also evaluated by [facility] in 2014 and was found to have significant esophageal dysmotility and increased LES tone.  She has had a J tube on two separate occasions in the past.   On 07/29/2013 she had lower esophageal Botox injections.  HPI   Chief Complaint  Patient presents with  . Follow-up    pt on pred taper which is helping s/s, but pt still c/o R sided back pain behind shoulder blade worse with deep breaths. Pt also c/o intermittent throat swelling.    Anne [name] says that in the last few months she was doing OK. She had some prednisone called in by my partner due to increasing cough and wheezing.  She says that this helped.    She says that she had conjunctivitis and was told that this was probably viral. She says that she has been experiencing some pain in her back which contributes to some breathing difficulty.    She has ben treating it with stretching and OTC muscle ointments.    She  is interested in having her tracheostomy removed.  She is tired of it and she does want to explore getting rid of it.     Past Medical History  Diagnosis Date  . Asthma   . COPD (chronic obstructive pulmonary disease)   . Stroke     slurred speech, drawn face, imaging normal, occurred twice, [facility]  . Migraine   . Allergic rhinitis   . Anemia   . IBS (irritable bowel syndrome)   . Shingles   . Tracheostomy in place   . Complication of anesthesia     Breathing problems  . Pulmonary fibrosis   . Vocal cord paresis   . Heart murmur     as child     Review of Systems  Constitutional: Negative for fever, chills and fatigue.  HENT: Negative for congestion, postnasal drip, rhinorrhea, sinus pressure and sore throat.   Respiratory: Positive for shortness of breath. Negative for cough and chest tightness.   Cardiovascular: Negative for chest pain, palpitations and leg swelling.       Objective:   Physical Exam Filed Vitals:   07/15/14 1027  BP: 116/68  Pulse: 74  Height: $Remove'5\' 3"'kQZbXCp$  (1.6 m)  Weight: 108 lb (48.988 kg)  SpO2: 99%  RA  Gen:  Well appearing, no acute distress HEENT: NCAT, EOMi, OP clear,  PULM: Clear to auscultation bilaterally CV: RRR, no mgr, no JVD AB: BS+, soft, nontender, Ext: warm, no edema, no clubbing, no cyanosis  Neuro: Awake and alert   04/2011 [facility] chest at [facility]>> emphysema bilaterally; upper lobe groundglass opacification, interstitial thickening, interlobular thickening, and apical capping noted bilaterally. This is significantly worsened since the 2007 comparison study. 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 As I have noted in multiple notes visits previously all of her wheezing and shortness of breath can be eliminated when I removed From her tracheostomy. I have never once heard her have wheezing from her lung fields alone. 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  She has some chronic scarring in the superior portions of her lungs which I evaluated with a bronchoscopy and biopsy. Fortunately this was negative. If there is evidence of progression of that than that would need to be evaluated with an open lung biopsy. However at this time I feel that all of her symptoms come from vocal cord problems. Primarily, I feel that this is vocal cord dysfunction given the intermittent nature.  Plan: -I recommended that the tracheostomy be removed ever -She can discuss this with her ear nose and throat physician -I recommended that she see the fully center at [facility] or at [facility] for therapy for vocal cord dysfunction  Greater than 30 minutes were spent in direct consultation with the patient today in clinic   Postinflammatory pulmonary fibrosis She has mild scarring in the apices of her lungs which is due to chronic cough. This is been evaluated extensively with a bronchoscopy with biopsy and workup. There is no evidence of exacerbation based on a recent  chest x-ray which I have reviewed.   Tracheostomy dependent I have recommended that this never be removed unless she has over 2 years of time with out doctor's visits for acute upper respiratory infections. It has literally saved her hospitalizations and possibly intubations because her vocal cord dysfunction minute's severe COPD and asthma severely. In fact I do not believe that she has COPD at all. She breathes fine even when sick when the tracheostomy has been uncapped.     Updated Medication List Outpatient Encounter Prescriptions as of 07/15/2014  Medication Sig  . albuterol (PROVENTIL HFA;VENTOLIN HFA) 108 (90 BASE) MCG/ACT inhaler Inhale 2 puffs into the lungs every 6 (six) hours as needed for wheezing or shortness of breath.  . ALPRAZolam (XANAX) 0.25 MG tablet TAKE 1 TABLET BY MOUTH 2 TIMES A DAY AS NEEDED  . benzonatate (TESSALON) 100 MG capsule Take 1 capsule (100 mg total) by mouth every 6 (six) hours as needed for cough.  [name]  BLACK COHOSH PO Take 40 mg by mouth.  . cetirizine (ZYRTEC) 10 MG tablet Take 1 tablet (10 mg total) by mouth daily.  . chlorpheniramine-HYDROcodone (TUSSIONEX) 10-8 MG/5ML LQCR Take 5 mLs by mouth every 12 (twelve) hours as needed for cough.  . clopidogrel (PLAVIX) 75 MG tablet TAKE 1 TABLET (75 MG TOTAL) BY MOUTH EVERY MORNING.  . Cyanocobalamin (VITAMIN B-12 IJ) Inject as directed every 30 (thirty) days.  [name] esomeprazole (NEXIUM) 40 MG capsule Take 1 capsule (40 mg total) by mouth daily at 12 noon.  . fluticasone (FLONASE) 50 MCG/ACT nasal spray Place 2 sprays into both nostrils daily.  . furosemide (LASIX) 20 MG tablet TAKE 1 TABLET BY MOUTH ONCE A DAY AS NEEDED  . HYDROcodone-acetaminophen (NORCO/VICODIN) 5-325 MG per tablet Take 1 tablet by mouth 2 (two) times daily as needed for moderate pain.  . hydroxypropyl methylcellulose (ISOPTO TEARS) 2.5 % ophthalmic solution Place 1 drop into both eyes 3 (three) times daily as needed for dry eyes.  . hyoscyamine  (LEVSIN SL) 0.125 MG SL tablet Place 1 tablet (0.125 mg total) under the tongue every 4 (four) hours as needed.  [name] ibuprofen (ADVIL,MOTRIN) 200 MG tablet Take 400-600 mg by mouth every 6 (six) hours as needed for mild pain or moderate pain.  [name] ipratropium-albuterol (DUONEB) 0.5-2.5 (3) MG/3ML SOLN Take 3 mLs by nebulization every 4 (four) hours as needed. Dx 496  . montelukast (SINGULAIR) 10 MG tablet TAKE 1 TABLET BY MOUTH EVERY DAY  . mupirocin ointment (BACTROBAN) 2 % Place 1 application into the nose 2 (two) times daily as needed.  . predniSONE (DELTASONE) 10 MG tablet $RemoveB'40mg'ywEDeQaQ$  X1 day, $Remov'30mg'CAbgln$  X1 day, $Remov'20mg'NWJneA$  X1 day, $Remov'10mg'koTxOy$  X1 day, $Remov'5mg'ugazZg$  X1 day.  . sodium chloride (OCEAN) 0.65 % SOLN nasal spray Place 1 spray into both nostrils as needed for congestion.  . TURMERIC PO Take by mouth.  . [DISCONTINUED] predniSONE (DELTASONE) 10 MG tablet 4 x 3 days, 3 x 3 days, 2 x 2 days 1 x 2 days, then stop

## 2014-07-15 NOTE — Patient Instructions (Signed)
Talk to your ear nose and throat physician about vocal cord dysfunction and whether or not he feels that she would benefit from behavioral therapy Follow-up with [place] in 6 months Continue to use albuterol on an as-needed basis

## 2014-07-15 NOTE — Assessment & Plan Note (Signed)
" >>  ASSESSMENT AND PLAN FOR PULMONARY FIBROSIS (HCC) WRITTEN ON 07/15/2014  1:26 PM BY Lis Savitt B, MD  She has mild scarring in the apices of her lungs which is due to chronic cough. This is been evaluated extensively with a bronchoscopy with biopsy and workup. There is no evidence of exacerbation based on a recent chest x-ray which I have reviewed. "

## 2014-07-16 ENCOUNTER — Telehealth: Payer: Self-pay | Admitting: Pulmonary Disease

## 2014-07-16 NOTE — Telephone Encounter (Signed)
lmtcb

## 2014-07-16 NOTE — Telephone Encounter (Signed)
Pt returned call  [phone] No need to call pt back.  Pt was calling to let MW know that she used dr werts name as her referring dr to dr Loma Sousa.

## 2014-07-16 NOTE — Telephone Encounter (Signed)
Will send to Dr Melvyn Novas as Juluis Rainier.  Nothing further needed.

## 2014-07-17 NOTE — Op Note (Signed)
PATIENT NAME:  Anne [name], Anne [name] MR#:  [id number] DATE OF BIRTH:  August 31, 1950  DATE OF PROCEDURE:  04/08/2012  PREOPERATIVE DIAGNOSIS:  Cataract, left eye.    POSTOPERATIVE DIAGNOSIS:  Cataract, left eye.  PROCEDURE PERFORMED:  Extracapsular cataract extraction using phacoemulsification with placement of an Alcon [id number], 24.5-diopter posterior chamber lens, serial #[id number].   SURGEON:  Loura Back. Iisha Soyars, MD  ASSISTANT:  None.  ANESTHESIA:  4% lidocaine and 0.75% Marcaine in a 50/50 mixture with 10 units/mL of Vitrase added, given as a peribulbar.   ANESTHESIOLOGIST:  Dr. Benjamine Mola.  COMPLICATIONS:  None.  ESTIMATED BLOOD LOSS:  Less than 1 ml.  DESCRIPTION OF PROCEDURE:  The patient was brought to the operating room and given a peribulbar block.  The patient was then prepped and draped in the usual fashion.  The vertical rectus muscles were imbricated using 5-0 silk sutures.  These sutures were then clamped to the sterile drapes as bridle sutures.  A limbal peritomy was performed extending two clock hours and hemostasis was obtained with cautery.  A partial thickness scleral groove was made at the surgical limbus and dissected anteriorly in a lamellar dissection using an Alcon crescent knife.  The anterior chamber was entered supero-temporally with a Superblade and through the lamellar dissection with a 2.6 mm keratome.  DisCoVisc was used to replace the aqueous and a continuous tear capsulorrhexis was carried out.  Hydrodissection and hydrodelineation were carried out with balanced salt and a 27 gauge canula.  The nucleus was rotated to confirm the effectiveness of the hydrodissection.  Phacoemulsification was carried out using a divide-and-conquer technique.  Total ultrasound time was 58 seconds with an average power of 21.3 percent and CDE was 20.92.  Irrigation/aspiration was used to remove the residual cortex.  DisCoVisc was used to inflate the capsule and the internal incision was  enlarged to 3 mm with the crescent knife.  The intraocular lens was folded and inserted into the capsular bag using the AcrySert delivery system. Irrigation/aspiration was used to remove the residual DisCoVisc.  Miostat was injected into the anterior chamber through the paracentesis track to inflate the anterior chamber and induce miosis.  Cefuroxime, 0.1 mL, was injected into the anterior chamber through the paracentesis. The wound was checked for leaks and none were found. The conjunctiva was closed with cautery and the bridle sutures were removed.  Two drops of 0.3% Vigamox were placed on the eye.   An eye shield was placed on the eye.  The patient was discharged to the recovery room in good condition. ____________________________ Loura Back Clarence Cogswell, MD sad:sb D: 04/08/2012 13:14:36 ET T: 04/08/2012 13:33:58 ET JOB#: [id number]  cc: Remo Lipps A. Kennet Mccort, MD, <Dictator> Martie Lee MD ELECTRONICALLY SIGNED 04/15/2012 13:38

## 2014-07-28 ENCOUNTER — Other Ambulatory Visit: Payer: Self-pay | Admitting: Internal Medicine

## 2014-07-28 ENCOUNTER — Encounter: Payer: Self-pay | Admitting: *Deleted

## 2014-07-28 ENCOUNTER — Ambulatory Visit (INDEPENDENT_AMBULATORY_CARE_PROVIDER_SITE_OTHER): Payer: Medicare Other | Admitting: *Deleted

## 2014-07-28 DIAGNOSIS — E538 Deficiency of other specified B group vitamins: Secondary | ICD-10-CM

## 2014-07-28 MED ORDER — CYANOCOBALAMIN 1000 MCG/ML IJ SOLN
1000.0000 ug | Freq: Once | INTRAMUSCULAR | Status: AC
  Administered 2014-07-28: 1000 ug via INTRAMUSCULAR

## 2014-07-29 ENCOUNTER — Other Ambulatory Visit: Payer: Self-pay | Admitting: Rheumatology

## 2014-07-29 ENCOUNTER — Telehealth: Payer: Self-pay | Admitting: Internal Medicine

## 2014-07-29 DIAGNOSIS — R200 Anesthesia of skin: Secondary | ICD-10-CM

## 2014-07-29 DIAGNOSIS — M545 Low back pain: Secondary | ICD-10-CM

## 2014-07-29 NOTE — Telephone Encounter (Signed)
Pt states she has been having problems with bloating. States that it begins in the am and gets worse as the day goes on. Pt has tried changing her diet and taking gas-x and this has not helped. Encouraged pt to try align BID to see if this helps. Pt will call back if no improvement and keep her scheduled OV.

## 2014-08-06 ENCOUNTER — Ambulatory Visit: Payer: Medicare Other

## 2014-08-10 ENCOUNTER — Other Ambulatory Visit: Payer: Self-pay | Admitting: Internal Medicine

## 2014-08-10 ENCOUNTER — Ambulatory Visit
Admission: RE | Admit: 2014-08-10 | Discharge: 2014-08-10 | Disposition: A | Discharge status: Home / Self Care | Payer: Medicare Other | Source: Ambulatory Visit | Attending: Rheumatology | Admitting: Rheumatology

## 2014-08-10 DIAGNOSIS — M4806 Spinal stenosis, lumbar region: Secondary | ICD-10-CM | POA: Insufficient documentation

## 2014-08-10 DIAGNOSIS — R200 Anesthesia of skin: Secondary | ICD-10-CM | POA: Insufficient documentation

## 2014-08-10 DIAGNOSIS — M545 Low back pain: Secondary | ICD-10-CM | POA: Diagnosis present

## 2014-08-16 ENCOUNTER — Other Ambulatory Visit: Payer: Self-pay | Admitting: Internal Medicine

## 2014-08-25 ENCOUNTER — Ambulatory Visit: Payer: Medicare Other | Admitting: Internal Medicine

## 2014-08-31 ENCOUNTER — Telehealth: Payer: Self-pay | Admitting: Internal Medicine

## 2014-09-03 ENCOUNTER — Ambulatory Visit (INDEPENDENT_AMBULATORY_CARE_PROVIDER_SITE_OTHER): Payer: Medicare Other

## 2014-09-03 ENCOUNTER — Ambulatory Visit (INDEPENDENT_AMBULATORY_CARE_PROVIDER_SITE_OTHER): Payer: Medicare Other | Admitting: Internal Medicine

## 2014-09-03 ENCOUNTER — Encounter: Payer: Self-pay | Admitting: Internal Medicine

## 2014-09-03 VITALS — BP 103/65 | HR 57 | Temp 97.9°F | Wt 111.1 lb

## 2014-09-03 DIAGNOSIS — G894 Chronic pain syndrome: Secondary | ICD-10-CM

## 2014-09-03 DIAGNOSIS — K5901 Slow transit constipation: Secondary | ICD-10-CM | POA: Diagnosis not present

## 2014-09-03 DIAGNOSIS — J8410 Pulmonary fibrosis, unspecified: Secondary | ICD-10-CM

## 2014-09-03 DIAGNOSIS — R140 Abdominal distension (gaseous): Secondary | ICD-10-CM | POA: Insufficient documentation

## 2014-09-03 DIAGNOSIS — N951 Menopausal and female climacteric states: Secondary | ICD-10-CM | POA: Insufficient documentation

## 2014-09-03 DIAGNOSIS — E538 Deficiency of other specified B group vitamins: Secondary | ICD-10-CM | POA: Diagnosis not present

## 2014-09-03 MED ORDER — LINACLOTIDE 145 MCG PO CAPS: 145.0000 ug | ORAL_CAPSULE | Freq: Every day | ORAL | Status: DC

## 2014-09-03 MED ORDER — CYANOCOBALAMIN 1000 MCG/ML IJ SOLN
1000.0000 ug | Freq: Once | INTRAMUSCULAR | Status: AC
  Administered 2014-09-03: 1000 ug via INTRAMUSCULAR

## 2014-09-03 MED ORDER — HYDROCOD POLST-CPM POLST ER 10-8 MG/5ML PO SUER: 5.0000 mL | Freq: Every evening | ORAL | Status: DC | PRN

## 2014-09-03 NOTE — Progress Notes (Signed)
Pre visit review using our clinic review tool, if applicable. No additional management support is needed unless otherwise documented below in the visit note. 

## 2014-09-03 NOTE — Assessment & Plan Note (Signed)
Recent abdominal bloating. Exam is normal. Pt is s/p hysterectomy. Suspect medications are contributing to chronic constipation. Will start Linzess. Follow up with GI and here in 3 months.

## 2014-09-03 NOTE — Patient Instructions (Signed)
Start Linzess 18mcg daily in the morning to help with constipation.  We will set up follow up with Dr. Hilarie Fredrickson.

## 2014-09-03 NOTE — Progress Notes (Signed)
Subjective:    Patient ID: Anne [name], female    DOB: [birth year], 64 y.o.   MRN: [id number]  HPI  64YO female presents for follow up.  Abdominal bloating - Feeling bloated and having some abdominal distension. Feels like her "metabolism" has slowed down. More constipation. Contacted Dr.Pyrtle. Started on [employer]. Took for 2 weeks, then stopped. Feels full more often. Avoiding gassy foods. Makes it harder to urinate. Had a total hysterectomy.  Hot flashes - Continues to have hot flashes off premarin.   Chronic back pain - Followed up with Dr. Jefm Bryant. Had MRI lumbar spine, found herniated disc and spinal stenosis. Planning to start physical therapy.   Past medical, surgical, family and social history per today's encounter.  Review of Systems  Constitutional: Negative for fever, chills, appetite change, fatigue and unexpected weight change.  Eyes: Negative for visual disturbance.  Respiratory: Positive for cough. Negative for chest tightness and shortness of breath.   Cardiovascular: Negative for chest pain and leg swelling.  Gastrointestinal: Positive for constipation and abdominal distention. Negative for nausea, vomiting, abdominal pain and diarrhea.  Musculoskeletal: Negative for myalgias and arthralgias.  Skin: Negative for color change and rash.  Hematological: Negative for adenopathy. Does not bruise/bleed easily.  Psychiatric/Behavioral: Negative for sleep disturbance and dysphoric mood. The patient is not nervous/anxious.        Objective:    BP 103/65 mmHg  Pulse 57  Temp(Src) 97.9 F (36.6 C) (Oral)  Wt 111 lb 2 oz (50.406 kg)  SpO2 100% Physical Exam  Constitutional: She is oriented to person, place, and time. She appears well-developed and well-nourished. No distress.  HENT:  Head: Normocephalic and atraumatic.  Right Ear: External ear normal.  Left Ear: External ear normal.  Nose: Nose normal.  Mouth/Throat: Oropharynx is clear and moist. No  oropharyngeal exudate.  Eyes: Conjunctivae and EOM are normal. Pupils are equal, round, and reactive to light. Right eye exhibits no discharge. Left eye exhibits no discharge. No scleral icterus.  Neck: Normal range of motion. Neck supple. No tracheal deviation present. No thyromegaly present.  Trach in place  Cardiovascular: Normal rate, regular rhythm, normal heart sounds and intact distal pulses.  Exam reveals no gallop and no friction rub.   No murmur heard. Pulmonary/Chest: Effort normal and breath sounds normal. No respiratory distress. She has no wheezes. She has no rales. She exhibits no tenderness.  Abdominal: Soft. Bowel sounds are normal. She exhibits no distension and no mass. There is no tenderness. There is no rebound and no guarding.  Musculoskeletal: Normal range of motion. She exhibits no edema or tenderness.  Lymphadenopathy:    She has no cervical adenopathy.  Neurological: She is alert and oriented to person, place, and time. No cranial nerve deficit. She exhibits normal muscle tone. Coordination normal.  Skin: Skin is warm and dry. No rash noted. She is not diaphoretic. No erythema. No pallor.  Psychiatric: She has a normal mood and affect. Her behavior is normal. Judgment and thought content normal.          Assessment & Plan:   Problem List Items Addressed This Visit      Unprioritized   Abdominal bloating - Primary    Recent abdominal bloating. Exam is normal. Pt is s/p hysterectomy. Suspect medications are contributing to chronic constipation. Will start Linzess. Follow up with GI and here in 3 months.      Chronic pain syndrome    Chronic back pain. MRI lumbar spine reportedly showed  spinal stenosis and herniated disc by her report. Planning to start physical therapy.      Menopausal symptom    Menopausal symptoms with hot flashes and vaginal dryness. Discussed risks of HRT. Discussed potential treatment options. She reports that hot flashes are not  bothersome at this point and she will hold off on any additional treatment.      Postinflammatory pulmonary fibrosis    Symptomatically doing well. Continue current medications. Refill given on Tussionex today.      Slow transit constipation    Chronic constipation recently worsening. Will add Linzess. Follow up with GI and then here in 3 months.      Relevant Medications   Linaclotide (LINZESS) 145 MCG CAPS capsule       Return in about 3 months (around 12/04/2014) for Recheck.

## 2014-09-03 NOTE — Assessment & Plan Note (Signed)
" >>  ASSESSMENT AND PLAN FOR PULMONARY FIBROSIS ([facility]) WRITTEN ON 09/03/2014  9:25 AM BY VANNIE, Aaniyah Strohm A, MD  Symptomatically doing well. Continue current medications. Refill given on Tussionex today. "

## 2014-09-03 NOTE — Assessment & Plan Note (Signed)
Chronic constipation recently worsening. Will add Linzess. Follow up with GI and then here in 3 months.

## 2014-09-03 NOTE — Assessment & Plan Note (Signed)
Symptomatically doing well. Continue current medications. Refill given on Tussionex today.

## 2014-09-03 NOTE — Assessment & Plan Note (Signed)
Chronic back pain. MRI lumbar spine reportedly showed spinal stenosis and herniated disc by her report. Planning to start physical therapy.

## 2014-09-03 NOTE — Assessment & Plan Note (Signed)
Menopausal symptoms with hot flashes and vaginal dryness. Discussed risks of HRT. Discussed potential treatment options. She reports that hot flashes are not bothersome at this point and she will hold off on any additional treatment.

## 2014-09-03 NOTE — Progress Notes (Signed)
Patient was here to see Dr. Gilford Rile and injection was missed, scheduled on the nurse visit schedule.  Patient received B12 injection in Right Deltoid.  Patient tolerated well.

## 2014-10-01 ENCOUNTER — Telehealth: Payer: Self-pay | Admitting: Pulmonary Disease

## 2014-10-01 MED ORDER — PREDNISONE 10 MG PO TABS: ORAL_TABLET | ORAL | Status: DC

## 2014-10-01 NOTE — Telephone Encounter (Signed)
Spoke with pt, c/o increased sob, chest tightness Xseveral days.  Believes this is d/t heat and humidity.  Pt requesting a pred taper. Pt uses [facility] on [address] in [place].   BQ please advise on recs.  Thanks!

## 2014-10-01 NOTE — Telephone Encounter (Signed)
lmtcb x1 for pt. 

## 2014-10-01 NOTE — Telephone Encounter (Signed)
Ok to send prednisone taper 40mg  daily three days then 30mg  daily three days then 20mg  daily three days then 10mg  daily three days

## 2014-10-01 NOTE — Telephone Encounter (Signed)
Spoke with pt, aware of recs.  Med sent in.  Nothing further needed.

## 2014-10-05 ENCOUNTER — Encounter: Payer: Self-pay | Admitting: Internal Medicine

## 2014-10-05 ENCOUNTER — Other Ambulatory Visit: Payer: Self-pay | Admitting: Internal Medicine

## 2014-10-05 MED ORDER — ESTRADIOL 0.5 MG PO TABS: 0.5000 mg | ORAL_TABLET | Freq: Every day | ORAL | Status: DC

## 2014-10-06 ENCOUNTER — Ambulatory Visit: Payer: Medicare Other

## 2014-10-06 ENCOUNTER — Ambulatory Visit (INDEPENDENT_AMBULATORY_CARE_PROVIDER_SITE_OTHER): Payer: Medicare Other

## 2014-10-06 DIAGNOSIS — E538 Deficiency of other specified B group vitamins: Secondary | ICD-10-CM | POA: Diagnosis not present

## 2014-10-06 MED ORDER — CYANOCOBALAMIN 1000 MCG/ML IJ SOLN
1000.0000 ug | Freq: Once | INTRAMUSCULAR | Status: AC
  Administered 2014-10-06: 1000 ug via INTRAMUSCULAR

## 2014-10-10 ENCOUNTER — Other Ambulatory Visit: Payer: Self-pay | Admitting: Internal Medicine

## 2014-10-21 ENCOUNTER — Other Ambulatory Visit: Payer: Self-pay | Admitting: Internal Medicine

## 2014-10-21 ENCOUNTER — Telehealth: Payer: Self-pay | Admitting: Internal Medicine

## 2014-10-21 MED ORDER — HYDROCODONE-ACETAMINOPHEN 5-325 MG PO TABS: 1.0000 | ORAL_TABLET | Freq: Two times a day (BID) | ORAL | Status: DC | PRN

## 2014-10-21 NOTE — Addendum Note (Signed)
Addended by: Ronette Deter A on: 10/21/2014 11:07 AM   Modules accepted: Orders

## 2014-10-21 NOTE — Telephone Encounter (Signed)
Duplicate message.  Electronic request from pt sent to provider for approval.

## 2014-10-21 NOTE — Telephone Encounter (Signed)
Pt request refill on hydrocodone. Triage sheet filled out and given to [facility]

## 2014-10-23 ENCOUNTER — Telehealth: Payer: Self-pay | Admitting: Pulmonary Disease

## 2014-10-23 NOTE — Telephone Encounter (Signed)
Spoke with the pt  She states needing refill on tussionex  Cough is not worse, but about the same  I see that Dr Gilford Rile gave her rx for this on 09/03/14 Pt states that she had forgot about this, and remembers taking rx to pharm for them to hold  [facility] and [facility] med [facility]  Nothing further needed

## 2014-11-01 ENCOUNTER — Other Ambulatory Visit: Payer: Self-pay | Admitting: Internal Medicine

## 2014-11-10 ENCOUNTER — Encounter: Payer: Self-pay | Admitting: Internal Medicine

## 2014-11-17 ENCOUNTER — Telehealth: Payer: Self-pay | Admitting: Internal Medicine

## 2014-11-17 MED ORDER — HYOSCYAMINE SULFATE 0.125 MG SL SUBL: 0.1250 mg | SUBLINGUAL_TABLET | SUBLINGUAL | Status: DC | PRN

## 2014-11-17 NOTE — Telephone Encounter (Signed)
Pt states she is having problems with bloating. Pt has an OV scheduled but wants to know what she can do in the meantime. Pt instructed to take gas-x for the bloating and to take her hyomax. Pt states she needs a refill, refill called to pharmacy.

## 2014-11-20 ENCOUNTER — Encounter: Payer: Self-pay | Admitting: Internal Medicine

## 2014-11-20 ENCOUNTER — Ambulatory Visit (INDEPENDENT_AMBULATORY_CARE_PROVIDER_SITE_OTHER): Payer: Medicare Other | Admitting: Internal Medicine

## 2014-11-20 VITALS — BP 116/62 | HR 77 | Temp 98.4°F | Ht 63.0 in | Wt 114.8 lb

## 2014-11-20 DIAGNOSIS — B029 Zoster without complications: Secondary | ICD-10-CM

## 2014-11-20 MED ORDER — GABAPENTIN 250 MG/5ML PO SOLN: 100.0000 mg | Freq: Three times a day (TID) | ORAL | Status: DC

## 2014-11-20 MED ORDER — VALACYCLOVIR HCL POWD: 1.0000 g | Freq: Three times a day (TID) | Status: DC

## 2014-11-20 NOTE — Progress Notes (Signed)
Pre visit review using our clinic review tool, if applicable. No additional management support is needed unless otherwise documented below in the visit note. 

## 2014-11-20 NOTE — Assessment & Plan Note (Signed)
Symptoms are most consistent with shingles, however exam is not. Rash has now resolved. She may have milder case, given previous history of shingles outbreak. Given fairly severe nerve pain, will add Neurontin 100mg  po tid. Discussed potential side effects of this medication. Discussed potential limitations of antiviral medication this late in presentation, however will start Valtrex 1gm po tid x 7 days. She will follow up by email next week and visit in 2 weeks or sooner if needed.

## 2014-11-20 NOTE — Patient Instructions (Addendum)
Start Valtrex 1gm three times daily for 7 days.  Start Neurontin 100mg  three times daily for nerve pain. This medication may cause drowsiness.  Call or email with update on Monday.  Follow up in 2 weeks or sooner as needed.

## 2014-11-20 NOTE — Progress Notes (Signed)
Subjective:    Patient ID: Anne [name], female    DOB: 1950-08-27, 64 y.o.   MRN: [id number]  HPI  64YO female presents for acute visit.  Rash - [place] last week on concrete. No known injuries. Started feeling poorly generally, then developed burning and itching pain left anterior chest wall. Developed red itchy rash with tiny blisters. Very sensitive to touch. Mild sore throat. Rash has resolved over last 4-5 days to tiny red spots. Continues to be very sensitive. Wearing a bra or shirt is painful.  Had similar episode about  5 years ago with shingles in similar location after a fall. Lesions at that time were on posterior mid left back.  Past Medical History  Diagnosis Date  . Asthma   . COPD (chronic obstructive pulmonary disease)   . Stroke     slurred speech, drawn face, imaging normal, occurred twice, [facility]  . Migraine   . Allergic rhinitis   . Anemia   . IBS (irritable bowel syndrome)   . Shingles   . Tracheostomy in place   . Complication of anesthesia     Breathing problems  . Pulmonary fibrosis   . Vocal cord paresis   . Heart murmur     as child  . CVA (cerebral infarction)   . Esophageal dysmotility    Family History  Problem Relation Age of Onset  . Asthma Cousin   . COPD Cousin   . Breast cancer Maternal Grandmother   . Cancer Maternal Grandmother     breast  . Breast cancer Maternal Aunt   . Cancer Maternal Aunt     breast  . Asthma Father   . Cancer Father     lung  . Cancer Paternal Uncle     lung  . COPD Paternal Grandfather    Past Surgical History  Procedure Laterality Date  . Abdominal hysterectomy    . Tubal ligation    . Tracheostomy  1996    done at [facility], Dr. Kathyrn Sheriff  . Video bronchoscopy Bilateral 11/20/2012    Procedure: VIDEO BRONCHOSCOPY WITH FLUORO;  Surgeon: Juanito Doom, MD;  Location: [facility];  Service: Cardiopulmonary;  Laterality: Bilateral;  . Jejunostomy feeding tube      x2 both failed  . Esophageal  manometry N/A 12/16/2012    Procedure: ESOPHAGEAL MANOMETRY (EM);  Surgeon: Jerene Bears, MD;  Location: [facility];  Service: Gastroenterology;  Laterality: N/A;  . Eye surgery      Catarct surgery 2014  . Eye surgery as child    . Esophagogastroduodenoscopy (egd) with propofol N/A 07/29/2013    Procedure: ESOPHAGOGASTRODUODENOSCOPY (EGD) WITH PROPOFOL;  Surgeon: Jerene Bears, MD;  Location: [facility];  Service: Gastroenterology;  Laterality: N/A;  with botox injection  . Botox injection N/A 07/29/2013    Procedure: BOTOX INJECTION;  Surgeon: Jerene Bears, MD;  Location: [facility];  Service: Gastroenterology;  Laterality: N/A;   Social History   Social History  . Marital Status: Married    Spouse Name: N/A  . Number of Children: 4  . Years of Education: N/A   Social History Main Topics  . Smoking status: Never Smoker   . Smokeless tobacco: Never Used  . Alcohol Use: No  . Drug Use: No  . Sexual Activity: Not Asked   Other Topics Concern  . None   Social History Narrative   Lives in [place] with husband.  She has four children.   Retired from [employer]  Review of Systems  Constitutional: Positive for fatigue. Negative for fever, chills, appetite change and unexpected weight change.  HENT: Positive for sore throat.   Eyes: Negative for visual disturbance.  Respiratory: Positive for cough and shortness of breath (chronic).   Cardiovascular: Negative for chest pain and leg swelling.  Gastrointestinal: Negative for nausea, vomiting, abdominal pain, diarrhea and constipation.  Musculoskeletal: Positive for myalgias.  Skin: Positive for color change and rash.  Neurological: Negative for weakness.  Hematological: Negative for adenopathy. Does not bruise/bleed easily.  Psychiatric/Behavioral: Negative for dysphoric mood. The patient is not nervous/anxious.        Objective:    BP 116/62 mmHg  Pulse 77  Temp(Src) 98.4 F (36.9 C) (Oral)  Ht 5\' 3"  (1.6 m)   Wt 114 lb 12.8 oz (52.073 kg)  BMI 20.34 kg/m2  SpO2 96% Physical Exam  Constitutional: She is oriented to person, place, and time. She appears well-developed and well-nourished. No distress.  HENT:  Head: Normocephalic and atraumatic.  Right Ear: External ear normal.  Left Ear: External ear normal.  Nose: Nose normal.  Mouth/Throat: Oropharynx is clear and moist. No oropharyngeal exudate.  Eyes: Conjunctivae are normal. Pupils are equal, round, and reactive to light. Right eye exhibits no discharge. Left eye exhibits no discharge. No scleral icterus.  Neck: Normal range of motion. Neck supple. No tracheal deviation present. No thyromegaly present.  Cardiovascular: Normal rate, regular rhythm, normal heart sounds and intact distal pulses.  Exam reveals no gallop and no friction rub.   No murmur heard. Pulmonary/Chest: Effort normal and breath sounds normal. No respiratory distress. She has no wheezes. She has no rales. She exhibits no tenderness.  Musculoskeletal: Normal range of motion. She exhibits no edema or tenderness.  Lymphadenopathy:    She has no cervical adenopathy.  Neurological: She is alert and oriented to person, place, and time. No cranial nerve deficit. She exhibits normal muscle tone. Coordination normal.  Skin: Skin is warm and dry. Rash noted. She is not diaphoretic. There is erythema. No pallor.     Psychiatric: She has a normal mood and affect. Her behavior is normal. Judgment and thought content normal.          Assessment & Plan:   Problem List Items Addressed This Visit      Unprioritized   Shingles rash - Primary    Symptoms are most consistent with shingles, however exam is not. Rash has now resolved. She may have milder case, given previous history of shingles outbreak. Given fairly severe nerve pain, will add Neurontin 100mg  po tid. Discussed potential side effects of this medication. Discussed potential limitations of antiviral medication this late  in presentation, however will start Valtrex 1gm po tid x 7 days. She will follow up by email next week and visit in 2 weeks or sooner if needed.      Relevant Medications   ValACYclovir HCl POWD   gabapentin (NEURONTIN) 250 MG/5ML solution       Return in about 2 weeks (around 12/04/2014) for Recheck.

## 2014-11-23 ENCOUNTER — Encounter: Payer: Self-pay | Admitting: Internal Medicine

## 2014-11-24 ENCOUNTER — Encounter: Payer: Self-pay | Admitting: Nurse Practitioner

## 2014-11-24 ENCOUNTER — Telehealth: Payer: Self-pay | Admitting: Internal Medicine

## 2014-11-24 ENCOUNTER — Ambulatory Visit (INDEPENDENT_AMBULATORY_CARE_PROVIDER_SITE_OTHER): Payer: Medicare Other | Admitting: Nurse Practitioner

## 2014-11-24 ENCOUNTER — Other Ambulatory Visit: Payer: Self-pay | Admitting: Internal Medicine

## 2014-11-24 VITALS — BP 116/70 | HR 69 | Temp 98.5°F | Resp 14 | Ht 63.0 in | Wt 114.6 lb

## 2014-11-24 DIAGNOSIS — B029 Zoster without complications: Secondary | ICD-10-CM | POA: Diagnosis not present

## 2014-11-24 MED ORDER — ESTRADIOL 1 MG PO TABS: 1.0000 mg | ORAL_TABLET | Freq: Every day | ORAL | Status: DC

## 2014-11-24 NOTE — Patient Instructions (Addendum)
Please continue to follow up with Dr. Gilford Rile on 9/12 and if everything has resolved you can cancel within 24 hours of visit.   Continue medications as prescribed

## 2014-11-24 NOTE — Progress Notes (Signed)
Patient ID: Anne [name], female    DOB: [birth year]  Age: 64 y.o. MRN: [id number]  CC: Rash   HPI Anne [name] presents for follow-up of shingles.  1) Patient saw Dr. Gilford Rile last week with probable shingles patient was started on gabapentin and valacyclovir. Patient had left-sided small red dots instead of vesicles. Which she reported that this is how it presents (had in past). One new erythematous spot on right breast. Told to come back for reevaluation. No new pain patient reports gabapentin is helpful.   History Dareen has a past medical history of Asthma; COPD (chronic obstructive pulmonary disease); Stroke; Migraine; Allergic rhinitis; Anemia; IBS (irritable bowel syndrome); Shingles; Tracheostomy in place; Complication of anesthesia; Pulmonary fibrosis; Vocal cord paresis; Heart murmur; CVA (cerebral infarction); and Esophageal dysmotility.   She has past surgical history that includes Abdominal hysterectomy; Tubal ligation; Tracheostomy (1996); Video bronchoscopy (Bilateral, 11/20/2012); Jejunostomy feeding tube; Esophageal manometry (N/A, 12/16/2012); Eye surgery; Eye Surgery AS Child; Esophagogastroduodenoscopy (egd) with propofol (N/A, 07/29/2013); and Botox injection (N/A, 07/29/2013).   Her family history includes Asthma in her cousin and father; Breast cancer in her maternal aunt and maternal grandmother; COPD in her cousin and paternal grandfather; Cancer in her father, maternal aunt, maternal grandmother, and paternal uncle.She reports that she has never smoked. She has never used smokeless tobacco. She reports that she does not drink alcohol or use illicit drugs.  Outpatient Prescriptions Prior to Visit  Medication Sig Dispense Refill  . albuterol (PROVENTIL HFA;VENTOLIN HFA) 108 (90 BASE) MCG/ACT inhaler Inhale 2 puffs into the lungs every 6 (six) hours as needed for wheezing or shortness of breath. 1 Inhaler 2  . ALPRAZolam (XANAX) 0.25 MG tablet TAKE 1 TABLET BY MOUTH 2 TIMES A  DAY AS NEEDED 60 tablet 3  . benzonatate (TESSALON) 100 MG capsule Take 1 capsule (100 mg total) by mouth every 6 (six) hours as needed for cough. 30 capsule 1  . cetirizine (ZYRTEC) 10 MG tablet TAKE 1 TABLET (10 MG TOTAL) BY MOUTH DAILY. 30 tablet 11  . chlorpheniramine-HYDROcodone (TUSSIONEX PENNKINETIC ER) 10-8 MG/5ML SUER Take 5 mLs by mouth at bedtime as needed for cough. 140 mL 0  . clopidogrel (PLAVIX) 75 MG tablet TAKE 1 TABLET BY MOUTH IN THE MORNING 30 tablet 5  . Cyanocobalamin (VITAMIN B-12 IJ) Inject as directed every 30 (thirty) days.    [name] esomeprazole (NEXIUM) 40 MG capsule Take 1 capsule (40 mg total) by mouth daily at 12 noon. 30 capsule 6  . fluticasone (FLONASE) 50 MCG/ACT nasal spray Place 2 sprays into both nostrils daily.    . furosemide (LASIX) 20 MG tablet TAKE 1 TABLET BY MOUTH ONCE A DAY AS NEEDED 30 tablet 5  . gabapentin (NEURONTIN) 250 MG/5ML solution Take 2 mLs (100 mg total) by mouth 3 (three) times daily. 180 mL 1  . HYDROcodone-acetaminophen (NORCO/VICODIN) 5-325 MG per tablet Take 1 tablet by mouth 2 (two) times daily as needed for moderate pain. 60 tablet 0  . hydroxypropyl methylcellulose (ISOPTO TEARS) 2.5 % ophthalmic solution Place 1 drop into both eyes 3 (three) times daily as needed for dry eyes.    . hyoscyamine (LEVSIN SL) 0.125 MG SL tablet Place 1 tablet (0.125 mg total) under the tongue every 4 (four) hours as needed. 30 tablet 2  . ibuprofen (ADVIL,MOTRIN) 200 MG tablet Take 400-600 mg by mouth every 6 (six) hours as needed for mild pain or moderate pain.    [name] ipratropium-albuterol (DUONEB) 0.5-2.5 (3)  MG/3ML SOLN Take 3 mLs by nebulization every 4 (four) hours as needed. Dx 496 360 mL 2  . Linaclotide (LINZESS) 145 MCG CAPS capsule Take 1 capsule (145 mcg total) by mouth daily. 30 capsule 3  . montelukast (SINGULAIR) 10 MG tablet TAKE 1 TABLET BY MOUTH EVERY DAY 90 tablet 1  . mupirocin ointment (BACTROBAN) 2 % Place 1 application into the nose 2  (two) times daily as needed.    . predniSONE (DELTASONE) 10 MG tablet 40mg  qd X3 days, 30mg  qd X3 days, 20mg  qd X3 days, 10mg  qd X3 days. 30 tablet 0  . saccharomyces boulardii (FLORASTOR) 250 MG capsule Take 250 mg by mouth. Samples: Taking a few times a week    . sodium chloride (OCEAN) 0.65 % SOLN nasal spray Place 1 spray into both nostrils as needed for congestion.    . TURMERIC PO Take by mouth.    . ValACYclovir HCl POWD Take 1 g by mouth 3 (three) times daily. 25 g 0  . estradiol (ESTRACE) 0.5 MG tablet Take 1 tablet (0.5 mg total) by mouth daily. 30 tablet 3   No facility-administered medications prior to visit.    ROS Review of Systems  Constitutional: Negative for chills, diaphoresis and fatigue.  Musculoskeletal: Negative for myalgias.  Skin: Positive for rash.    Objective:  BP 116/70 mmHg  Pulse 69  Temp(Src) 98.5 F (36.9 C)  Resp 14  Ht 5\' 3"  (1.6 m)  Wt 114 lb 9.6 oz (51.982 kg)  BMI 20.31 kg/m2  SpO2 99%  Physical Exam  Constitutional: She is oriented to person, place, and time. She appears well-developed and well-nourished. No distress.  HENT:  Head: Normocephalic and atraumatic.  Right Ear: External ear normal.  Left Ear: External ear normal.  Pulmonary/Chest:    Tender area of breast shown by Pearline Cables zig-zag; red papule on right breast was new area of concern to pt, no tenderness, no vesicles  Neurological: She is alert and oriented to person, place, and time. No cranial nerve deficit. She exhibits normal muscle tone. Coordination normal.  Skin: Skin is warm and dry. She is not diaphoretic. There is erythema.  Psychiatric: She has a normal mood and affect. Her behavior is normal. Judgment and thought content normal.   Assessment & Plan:   There are no diagnoses linked to this encounter. I am having Ms. Fellman maintain her Cyanocobalamin (VITAMIN B-12 IJ), ibuprofen, hydroxypropyl methylcellulose / hypromellose, ipratropium-albuterol, sodium chloride,  benzonatate, esomeprazole, fluticasone, albuterol, ALPRAZolam, mupirocin ointment, TURMERIC PO, furosemide, clopidogrel, saccharomyces boulardii, Linaclotide, chlorpheniramine-HYDROcodone, predniSONE, cetirizine, HYDROcodone-acetaminophen, montelukast, hyoscyamine, ValACYclovir HCl, and gabapentin.  No orders of the defined types were placed in this encounter.     Follow-up: Return if symptoms worsen or fail to improve.

## 2014-11-24 NOTE — Telephone Encounter (Signed)
Spoke with pt, scheduled with Lorane Gell

## 2014-11-24 NOTE — Telephone Encounter (Signed)
If she has new blisters on the left side, then this may not be shingles. She needs to be seen again and re-examined.

## 2014-11-24 NOTE — Telephone Encounter (Signed)
Pt states she has new blisters (shingles) on the left side. Pt wants to know how long will it take for the blisters(shingles) stop breaking out? Pt is on the medication. Thank You!

## 2014-11-24 NOTE — Telephone Encounter (Signed)
Please advise 

## 2014-11-24 NOTE — Assessment & Plan Note (Signed)
Stable symptoms are still consistent with shingles, the presentation is different. Asked patient to continue on current regimen of medications as prescribed by Dr. Gilford Rile. Patient still due for follow-up with Dr. Gilford Rile in September. Patient advised she can cancel if everything is resolved. Asked patient to give 24-hour notice if canceling.

## 2014-12-07 ENCOUNTER — Ambulatory Visit: Payer: Medicare Other | Admitting: Internal Medicine

## 2014-12-10 ENCOUNTER — Other Ambulatory Visit: Payer: Self-pay | Admitting: Internal Medicine

## 2014-12-11 ENCOUNTER — Telehealth: Payer: Self-pay

## 2014-12-11 ENCOUNTER — Encounter: Payer: Self-pay | Admitting: Internal Medicine

## 2014-12-11 ENCOUNTER — Other Ambulatory Visit: Payer: Self-pay | Admitting: *Deleted

## 2014-12-11 MED ORDER — ALPRAZOLAM 0.25 MG PO TABS: ORAL_TABLET | ORAL | Status: DC

## 2014-12-11 NOTE — Telephone Encounter (Signed)
Patient called and stated that she needs a refill on her Alprazolam. Last refilled on 04/28/14 with 3 refills. Ok to refill this medication?

## 2014-12-11 NOTE — Telephone Encounter (Signed)
Filled by C. Doss x 1 month.

## 2014-12-25 ENCOUNTER — Ambulatory Visit (INDEPENDENT_AMBULATORY_CARE_PROVIDER_SITE_OTHER): Payer: Medicare Other | Admitting: Nurse Practitioner

## 2014-12-25 ENCOUNTER — Encounter: Payer: Self-pay | Admitting: Nurse Practitioner

## 2014-12-25 VITALS — BP 98/58 | HR 71 | Temp 97.5°F | Resp 14 | Ht 63.0 in | Wt 112.4 lb

## 2014-12-25 DIAGNOSIS — T148 Other injury of unspecified body region: Secondary | ICD-10-CM | POA: Diagnosis not present

## 2014-12-25 DIAGNOSIS — W57XXXA Bitten or stung by nonvenomous insect and other nonvenomous arthropods, initial encounter: Secondary | ICD-10-CM

## 2014-12-25 NOTE — Progress Notes (Signed)
Patient ID: Anne [name], female    DOB: [birth year]  Age: 64 y.o. MRN: [id number]  CC: Blisters   HPI Anne [name] presents for CC of "Blisters".   1) Start as pimples then when she rubs them they are tender and scab up. Denies pruritis, states the sites are tender  In hair, on left arm, neck, and chest  Sometimes they weep   Has 2 dogs, but they do not sleep in the bed with her or have fleas she reports.  She denies changing any detergent, new sheets Has had recent travel and will be going to the beach.   History Anne [name] has a past medical history of Asthma; COPD (chronic obstructive pulmonary disease); Stroke; Migraine; Allergic rhinitis; Anemia; IBS (irritable bowel syndrome); Shingles; Tracheostomy in place; Complication of anesthesia; Pulmonary fibrosis; Vocal cord paresis; Heart murmur; CVA (cerebral infarction); and Esophageal dysmotility.   She has past surgical history that includes Abdominal hysterectomy; Tubal ligation; Tracheostomy (1996); Video bronchoscopy (Bilateral, 11/20/2012); Jejunostomy feeding tube; Esophageal manometry (N/A, 12/16/2012); Eye surgery; Eye Surgery AS Child; Esophagogastroduodenoscopy (egd) with propofol (N/A, 07/29/2013); and Botox injection (N/A, 07/29/2013).   Her family history includes Asthma in her cousin and father; Breast cancer in her maternal aunt and maternal grandmother; COPD in her cousin and paternal grandfather; Cancer in her father, maternal aunt, maternal grandmother, and paternal uncle.She reports that she has never smoked. She has never used smokeless tobacco. She reports that she does not drink alcohol or use illicit drugs.  Outpatient Prescriptions Prior to Visit  Medication Sig Dispense Refill  . albuterol (PROVENTIL HFA;VENTOLIN HFA) 108 (90 BASE) MCG/ACT inhaler Inhale 2 puffs into the lungs every 6 (six) hours as needed for wheezing or shortness of breath. 1 Inhaler 2  . ALPRAZolam (XANAX) 0.25 MG tablet TAKE 1 TABLET BY MOUTH 2  TIMES A DAY AS NEEDED 60 tablet 0  . benzonatate (TESSALON) 100 MG capsule Take 1 capsule (100 mg total) by mouth every 6 (six) hours as needed for cough. 30 capsule 1  . cetirizine (ZYRTEC) 10 MG tablet TAKE 1 TABLET (10 MG TOTAL) BY MOUTH DAILY. 30 tablet 11  . chlorpheniramine-HYDROcodone (TUSSIONEX PENNKINETIC ER) 10-8 MG/5ML SUER Take 5 mLs by mouth at bedtime as needed for cough. 140 mL 0  . clopidogrel (PLAVIX) 75 MG tablet TAKE 1 TABLET BY MOUTH IN THE MORNING 30 tablet 5  . Cyanocobalamin (VITAMIN B-12 IJ) Inject as directed every 30 (thirty) days.    [name] esomeprazole (NEXIUM) 40 MG capsule Take 1 capsule (40 mg total) by mouth daily at 12 noon. 30 capsule 6  . estradiol (ESTRACE) 1 MG tablet Take 1 tablet (1 mg total) by mouth daily. 90 tablet 3  . fluticasone (FLONASE) 50 MCG/ACT nasal spray Place 2 sprays into both nostrils daily.    . furosemide (LASIX) 20 MG tablet TAKE 1 TABLET BY MOUTH ONCE A DAY AS NEEDED 30 tablet 5  . gabapentin (NEURONTIN) 250 MG/5ML solution Take 2 mLs (100 mg total) by mouth 3 (three) times daily. 180 mL 1  . HYDROcodone-acetaminophen (NORCO/VICODIN) 5-325 MG per tablet Take 1 tablet by mouth 2 (two) times daily as needed for moderate pain. 60 tablet 0  . hydroxypropyl methylcellulose (ISOPTO TEARS) 2.5 % ophthalmic solution Place 1 drop into both eyes 3 (three) times daily as needed for dry eyes.    . hyoscyamine (LEVSIN SL) 0.125 MG SL tablet Place 1 tablet (0.125 mg total) under the tongue every 4 (four) hours as needed.  30 tablet 2  . ibuprofen (ADVIL,MOTRIN) 200 MG tablet Take 400-600 mg by mouth every 6 (six) hours as needed for mild pain or moderate pain.    [name] ipratropium-albuterol (DUONEB) 0.5-2.5 (3) MG/3ML SOLN Take 3 mLs by nebulization every 4 (four) hours as needed. Dx 496 360 mL 2  . Linaclotide (LINZESS) 145 MCG CAPS capsule Take 1 capsule (145 mcg total) by mouth daily. 30 capsule 3  . montelukast (SINGULAIR) 10 MG tablet TAKE 1 TABLET BY MOUTH  EVERY DAY 90 tablet 1  . mupirocin ointment (BACTROBAN) 2 % Place 1 application into the nose 2 (two) times daily as needed.    . predniSONE (DELTASONE) 10 MG tablet 40mg  qd X3 days, 30mg  qd X3 days, 20mg  qd X3 days, 10mg  qd X3 days. 30 tablet 0  . saccharomyces boulardii (FLORASTOR) 250 MG capsule Take 250 mg by mouth. Samples: Taking a few times a week    . sodium chloride (OCEAN) 0.65 % SOLN nasal spray Place 1 spray into both nostrils as needed for congestion.    . TURMERIC PO Take by mouth.    . ValACYclovir HCl POWD Take 1 g by mouth 3 (three) times daily. 25 g 0   No facility-administered medications prior to visit.    ROS Review of Systems  Constitutional: Negative for fever, chills, diaphoresis and fatigue.  Skin: Positive for wound. Negative for rash.    Objective:  BP 98/58 mmHg  Pulse 71  Temp(Src) 97.5 F (36.4 C)  Resp 14  Ht 5\' 3"  (1.6 m)  Wt 112 lb 6.4 oz (50.984 kg)  BMI 19.92 kg/m2  SpO2 98%  Physical Exam  Constitutional: She is oriented to person, place, and time. She appears well-developed and well-nourished. No distress.  HENT:  Head: Normocephalic and atraumatic.  Right Ear: External ear normal.  Left Ear: External ear normal.  Neurological: She is alert and oriented to person, place, and time. No cranial nerve deficit. She exhibits normal muscle tone. Coordination normal.  Skin: Skin is warm and dry. No rash noted. She is not diaphoretic.     Very small, indurated, non-erythematous with scabbed centers  Psychiatric: She has a normal mood and affect. Her behavior is normal. Judgment and thought content normal.   Assessment & Plan:   Anne [name] was seen today for blisters.  Diagnoses and all orders for this visit:  Bug bites  I am having Anne [name] maintain her Cyanocobalamin (VITAMIN B-12 IJ), ibuprofen, hydroxypropyl methylcellulose / hypromellose, ipratropium-albuterol, sodium chloride, benzonatate, esomeprazole, fluticasone, albuterol, mupirocin  ointment, TURMERIC PO, furosemide, clopidogrel, saccharomyces boulardii, Linaclotide, chlorpheniramine-HYDROcodone, predniSONE, cetirizine, HYDROcodone-acetaminophen, montelukast, hyoscyamine, ValACYclovir HCl, gabapentin, estradiol, and ALPRAZolam.  No orders of the defined types were placed in this encounter.     Follow-up: Return if symptoms worsen or fail to improve.

## 2014-12-25 NOTE — Patient Instructions (Signed)
  SEEK IMMEDIATE MEDICAL CARE IF:   You have increased pain, redness, or swelling in the bite area.  You see a red line on the skin coming from the bite.  You have a fever.  You have joint pain.  You have a headache or neck pain.  You have unusual weakness.  You have a rash.  You have chest pain or shortness of breath.  You have abdominal pain, nausea, or vomiting.  You feel unusually tired or sleepy.  Try over the counter hydrocortisone creams

## 2014-12-25 NOTE — Progress Notes (Signed)
Pre visit review using our clinic review tool, if applicable. No additional management support is needed unless otherwise documented below in the visit note. 

## 2014-12-25 NOTE — Assessment & Plan Note (Signed)
Stable. Unsure of what bug may be affecting her. Discussed OTC topical creams and investigating her home for any bugs. Will follow if worsening.

## 2015-01-06 ENCOUNTER — Telehealth: Payer: Self-pay | Admitting: Internal Medicine

## 2015-01-06 DIAGNOSIS — Z1239 Encounter for other screening for malignant neoplasm of breast: Secondary | ICD-10-CM

## 2015-01-06 NOTE — Telephone Encounter (Signed)
Pt called to schedule for her mammo. It is a order in pt chart but it's from 2015. Need order for 2016 this year. Pt was given the number for [facility]. Please and thank you!

## 2015-01-07 ENCOUNTER — Ambulatory Visit (INDEPENDENT_AMBULATORY_CARE_PROVIDER_SITE_OTHER): Payer: Medicare Other

## 2015-01-07 DIAGNOSIS — E538 Deficiency of other specified B group vitamins: Secondary | ICD-10-CM | POA: Diagnosis not present

## 2015-01-07 MED ORDER — CYANOCOBALAMIN 1000 MCG/ML IJ SOLN
1000.0000 ug | Freq: Once | INTRAMUSCULAR | Status: AC
  Administered 2015-01-07: 1000 ug via INTRAMUSCULAR

## 2015-01-07 NOTE — Telephone Encounter (Signed)
Please place a new order if indicated.  Thanks

## 2015-01-07 NOTE — Progress Notes (Signed)
Pt is getting a B12 in right deltoid.

## 2015-01-19 ENCOUNTER — Telehealth: Payer: Self-pay | Admitting: *Deleted

## 2015-01-19 ENCOUNTER — Encounter: Payer: Self-pay | Admitting: Internal Medicine

## 2015-01-19 ENCOUNTER — Ambulatory Visit (INDEPENDENT_AMBULATORY_CARE_PROVIDER_SITE_OTHER): Payer: Medicare Other | Admitting: Internal Medicine

## 2015-01-19 VITALS — BP 114/70 | HR 68 | Ht 63.0 in | Wt 113.0 lb

## 2015-01-19 DIAGNOSIS — K220 Achalasia of cardia: Secondary | ICD-10-CM

## 2015-01-19 DIAGNOSIS — K224 Dyskinesia of esophagus: Secondary | ICD-10-CM | POA: Diagnosis not present

## 2015-01-19 DIAGNOSIS — K5900 Constipation, unspecified: Secondary | ICD-10-CM | POA: Diagnosis not present

## 2015-01-19 DIAGNOSIS — R1310 Dysphagia, unspecified: Secondary | ICD-10-CM | POA: Diagnosis not present

## 2015-01-19 MED ORDER — HYOSCYAMINE SULFATE 0.125 MG SL SUBL: 0.1250 mg | SUBLINGUAL_TABLET | SUBLINGUAL | Status: DC | PRN

## 2015-01-19 NOTE — Progress Notes (Signed)
Subjective:    Patient ID: Anne [name], female    DOB: 22-Sep-1950, 64 y.o.   MRN: [id number]  HPI Anne [name] is a 64 yo female with PMH of esophageal dysphagia, hypertensive LES, pulmonary fibrosis with asthma complicated by aspiration, vocal cord dysfunction, permanent tracheostomy, history of TIA, migraines who is here for follow-up. She was last seen in June 2015. She is here alone today.  After her last visit she had had Botox to the LES on 07/29/2013. At that time she reported worsening reflux and wasn't sure that it had helped her swallowing. She reports now in retrospect she feels that it actually did help. Specifically with swallowing solid foods. Now that the effect has worn off she is having some tightness in her lower chest and reports feeling "lumps" going down with swallowing solid foods. She still has to be careful with regurgitation and remain upright after eating and drinking. She would like to be considered for repeat Botox injection. She also has developed issues with constipation which has been an issue for several years. She's been having hard small pellet-like stools as infrequently as once a week. She has tried probiotic, yogurt, but this seems to have led to more cramping and bloating. She is using a women's over-the-counter laxative every other day with only modest effectiveness. She denies bleeding. She has some lower abdominal cramping from time to time. Levsin seems to really help this. Dr. Gilford [name] gave Linzess which she only used once. 145 g dose caused her to have a large loose bowel movement.  Since last visit her Premarin has been stopped. She's tried to expand her diet. Her weight is up 6 pounds from last year which she is happy about. Regarding her vocal cord dysfunction Dr. [place] [name] had recommended being seen for vocal cord therapy at [facility] or [facility] pH she talked to [facility] but was never seen. She continues Nexium 40 mg daily. She is rarely using hydrocodone for lower back  pain. She is currently off of probiotic  She reports Dr. Bary [name] in [place] perform colonoscopy 5-7 years ago which was reportedly normal. She was told to repeat in 10 years   Review of Systems As per HPI, otherwise negative  Current Medications, Allergies, Past Medical History, Past Surgical History, Family History and Social History were reviewed in [facility] record.     Objective:   Physical Exam BP 114/70 mmHg  Pulse 68  Ht 5\' 3"  (1.6 m)  Wt 113 lb (51.256 kg)  BMI 20.02 kg/m2 Constitutional: Well-developed and well-nourished. No distress. HEENT: Normocephalic and atraumatic. Oropharynx is clear and moist. No oropharyngeal exudate. Conjunctivae are normal.  No scleral icterus. Neck: Neck supple.   tracheostomy in place Cardiovascular: Normal rate, regular rhythm and intact distal pulses. No M/R/G Pulmonary/chest: Effort normal and breath sounds normal. No wheezing, rales or rhonchi. Abdominal: Soft, nontender, nondistended. Bowel sounds active throughout.  Extremities: no clubbing, cyanosis, or edema Neurological: Alert and oriented to person place and time. Skin: Skin is warm and dry. No rashes noted. Psychiatric: Normal mood and affect. Behavior is normal.  CBC    Component Value Date/Time   WBC 4.3 02/17/2014 1303   WBC 4.5 09/30/2013 1015   RBC 3.92 02/17/2014 1303   RBC 4.09 09/30/2013 1015   HGB 12.6 02/17/2014 1303   HGB 12.8 09/30/2013 1015   HCT 37.9 02/17/2014 1303   HCT 38.6 09/30/2013 1015   PLT 208 02/17/2014 1303   PLT 220.0 09/30/2013 1015  MCV 97 02/17/2014 1303   MCV 94.4 09/30/2013 1015   MCH 32.3 02/17/2014 1303   MCHC 33.4 02/17/2014 1303   MCHC 33.1 09/30/2013 1015   RDW 12.5 02/17/2014 1303   RDW 13.3 09/30/2013 1015   LYMPHSABS 1.2 02/17/2014 1303   LYMPHSABS 1.2 09/30/2013 1015   MONOABS 0.3 02/17/2014 1303   MONOABS 0.4 09/30/2013 1015   EOSABS 0.2 02/17/2014 1303   EOSABS 0.3 09/30/2013 1015   BASOSABS  0.0 02/17/2014 1303   BASOSABS 0.0 09/30/2013 1015    CMP     Component Value Date/Time   NA 140 04/28/2014 0935   K 3.8 04/28/2014 0935   K 3.6 04/03/2012 1653   CL 104 04/28/2014 0935   CO2 30 04/28/2014 0935   GLUCOSE 80 04/28/2014 0935   BUN 15 04/28/2014 0935   CREATININE 1.01 04/28/2014 0935   CALCIUM 9.6 04/28/2014 0935   PROT 6.8 04/28/2014 0935   ALBUMIN 4.2 04/28/2014 0935   AST 17 04/28/2014 0935   ALT 12 04/28/2014 0935   ALKPHOS 79 04/28/2014 0935   BILITOT 0.6 04/28/2014 0935    EGD from May 2015 reviewed    Assessment & Plan:   64 yo female with PMH of esophageal dysphagia, hypertensive LES, pulmonary fibrosis with asthma complicated by aspiration, vocal cord dysfunction, permanent tracheostomy, history of TIA, migraines who is here for follow-up.  1. Esophageal dysmotility/hypertensive LES -- in retrospect she feels that the Botox to the LES did help her in a significant way. She is interested in trying this again. We discussed the risks, benefits and alternatives and she is agreeable to proceed. Her Plavix will need to be held and we will get permission from Dr. Gilford [name] to hold this medicine 5 days before EGD. This will need to be done in the outpatient hospital setting with monitored anesthesia care.  2. Constipation -- somewhat chronic. I would like her to try Linzess 145 g on a regular basis to see if after several doses she has more normal and complete bowel movements. If she continues to have diarrhea I would like her to decrease the dose to 72.5 g daily. She will dissolve the contents of a 145 g tablet and a 10 mL's of water then take half of the medication. I've asked that she notify me in 3-4 weeks of how she is doing. She can continue Levsin on an as-needed basis for lower abdominal cramping though she does notice can slightly worse and constipation. She is using narcotics but infrequently also likely contributing to constipation.  3. CRC screening -- we  will request the official colonoscopy report from [facility] to see when she is due for repeat screening. She is average risk from family history perspective.  25 minutes spent with patient today

## 2015-01-19 NOTE — Telephone Encounter (Signed)
Patient advised that per Dr Gilford Rile, she may hold plavix 5 days prior to her endoscopy. She verbalizes understanding.

## 2015-01-19 NOTE — Telephone Encounter (Signed)
It will be fine for her to come off the Plavix for 5 days prior to her endoscopy. Thanks, Sandi Carne

## 2015-01-19 NOTE — Telephone Encounter (Signed)
I have left a message for patient to call back. 

## 2015-01-19 NOTE — Telephone Encounter (Signed)
01/19/2015  RE: Anne [name] DOB: 1950-12-11 MRN: [id number]  Dear Dr Gilford Rile,   We have scheduled the above patient for an endoscopy procedure. Our records show that she is on anticoagulation therapy.   Please advise as to whether the patient may come off her therapy of Plavix 5 days prior to the procedure, which is scheduled for 01/26/15.  Please route your response to Dixon Boos, CMA  Sincerely, Dixon Boos

## 2015-01-19 NOTE — Patient Instructions (Addendum)
You have been scheduled for an endoscopy. Please follow the written instructions given to you at your visit today. Please pick up your prep supplies at the pharmacy within the next 1-3 days. If you use inhalers (even only as needed), please bring them with you on the day of your procedure. Your physician has requested that you go to www.startemmi.com and enter the access code given to you at your visit today. This web site gives a general overview about your procedure. However, you should still follow specific instructions given to you by our office regarding your preparation for the procedure.  We have sent the following medications to your pharmacy for you to pick up at your convenience: Levsin  Please take Linzess 145 mcg (you already have this) once daily. Should you have diarrhea, you may open a capsule and mix contents into 10 ml water. Drink 1/2 (5 ml) of the solution daily.  You will be contacted by our office prior to your procedure for directions on holding your Plavix.  If you do not hear from our office 1 week prior to your scheduled procedure, please call [phone] to discuss.

## 2015-01-25 ENCOUNTER — Other Ambulatory Visit: Payer: Self-pay | Admitting: Internal Medicine

## 2015-01-26 NOTE — Telephone Encounter (Signed)
Please advise 

## 2015-01-27 ENCOUNTER — Telehealth: Payer: Self-pay | Admitting: Internal Medicine

## 2015-01-27 ENCOUNTER — Other Ambulatory Visit: Payer: Self-pay | Admitting: *Deleted

## 2015-01-27 MED ORDER — MAGIC MOUTHWASH: 5.0000 mL | Freq: Every day | ORAL | Status: DC

## 2015-01-27 NOTE — Telephone Encounter (Signed)
lmtcb x1 for pt. 

## 2015-01-27 NOTE — Telephone Encounter (Signed)
We can do magic mouthwash for her, standard treatment.  Please inform her that after thrush clears, she is to continue using Magic mouthwash for 3 more days, then stop completely.   Please inform patient to gargle and rinse after use of the inhaler.   Thank you  -VM

## 2015-01-27 NOTE — Telephone Encounter (Signed)
Spoke with pt. States that she has developed thrush from using her inhaler. Would like to have magic mouthwash sent in.  Dr. Stevenson Clinch please advsie. Thanks.

## 2015-01-27 NOTE — Telephone Encounter (Signed)
Pt calling back [phone]

## 2015-01-27 NOTE — Telephone Encounter (Signed)
Patient notified of Dr. Merian Capron recommendations. Rx for Mouthwash sent to pharmacy.  Patient notified. Nothing further needed. Closing encounter

## 2015-02-01 ENCOUNTER — Telehealth: Payer: Self-pay | Admitting: Internal Medicine

## 2015-02-01 NOTE — Telephone Encounter (Signed)
Patient called to let us know that she has been taking Linzess 1/2 capful daily. It does not seem to help much. However, when she takes a full capsule, she "cant get out of the bathroom." Would you like me to have her take Linzess 1 capful every other day? Try something else?

## 2015-02-02 NOTE — Telephone Encounter (Signed)
Let's continue half dose 145 g Linzess and add MiraLAX 17 g daily I think if we did full strength Linzess every other day then every other day she would have a lot of diarrhea

## 2015-02-02 NOTE — Telephone Encounter (Signed)
Left message for patient to call back  

## 2015-02-02 NOTE — Telephone Encounter (Signed)
Patient has been advised of Dr Vena Rua recommendations and verbalizes understanding.

## 2015-02-05 ENCOUNTER — Ambulatory Visit: Payer: Medicare Other | Admitting: Internal Medicine

## 2015-02-08 ENCOUNTER — Other Ambulatory Visit: Payer: Self-pay | Admitting: Internal Medicine

## 2015-02-08 ENCOUNTER — Ambulatory Visit
Admission: RE | Admit: 2015-02-08 | Discharge: 2015-02-08 | Disposition: A | Discharge status: Home / Self Care | Payer: Medicare Other | Source: Ambulatory Visit | Attending: Internal Medicine | Admitting: Internal Medicine

## 2015-02-08 DIAGNOSIS — Z1231 Encounter for screening mammogram for malignant neoplasm of breast: Secondary | ICD-10-CM | POA: Diagnosis not present

## 2015-02-08 DIAGNOSIS — Z1239 Encounter for other screening for malignant neoplasm of breast: Secondary | ICD-10-CM

## 2015-02-09 ENCOUNTER — Ambulatory Visit: Payer: Medicare Other | Admitting: Internal Medicine

## 2015-02-09 ENCOUNTER — Encounter: Payer: Self-pay | Admitting: Internal Medicine

## 2015-02-09 ENCOUNTER — Ambulatory Visit (INDEPENDENT_AMBULATORY_CARE_PROVIDER_SITE_OTHER): Payer: Medicare Other | Admitting: Internal Medicine

## 2015-02-09 VITALS — BP 118/66 | HR 70 | Ht 63.5 in | Wt 112.0 lb

## 2015-02-09 DIAGNOSIS — J383 Other diseases of vocal cords: Secondary | ICD-10-CM | POA: Diagnosis not present

## 2015-02-09 DIAGNOSIS — J8410 Pulmonary fibrosis, unspecified: Secondary | ICD-10-CM

## 2015-02-09 DIAGNOSIS — Z930 Tracheostomy status: Secondary | ICD-10-CM

## 2015-02-09 DIAGNOSIS — B370 Candidal stomatitis: Secondary | ICD-10-CM | POA: Insufficient documentation

## 2015-02-09 MED ORDER — HYDROCOD POLST-CPM POLST ER 10-8 MG/5ML PO SUER: 5.0000 mL | Freq: Every evening | ORAL | Status: DC | PRN

## 2015-02-09 NOTE — Patient Instructions (Signed)
Follow up with Dr. Stevenson Clinch in: 49months - Refill Tussionex - Continue with usual trach care - Speak to your primary care physician about Epipen - cont with magic mouthwash as prescribed.

## 2015-02-09 NOTE — Assessment & Plan Note (Signed)
" >>  ASSESSMENT AND PLAN FOR PULMONARY FIBROSIS (HCC) WRITTEN ON 02/09/2015  5:32 PM BY Lolah Coghlan, MD  She has mild scarring in the apices of her lungs which is due to chronic cough. This is been evaluated extensively with a bronchoscopy with biopsy and workup. There is no evidence of exacerbation based on a recent chest x-ray which I have reviewed.   "

## 2015-02-09 NOTE — Assessment & Plan Note (Signed)
Per Dr. McQuaid - the recommendation is that this never be removed. It has literally saved her hospitalizations and possibly intubations because of her vocal cord dysfunction, COPD and asthma. In fact Dr. McQuaid do not believe that she has COPD at all. She breathes fine even when sick when the tracheostomy has been uncapped.     

## 2015-02-09 NOTE — Progress Notes (Signed)
[facility] Consultation      MRN# [id number] Anne [name] [birth year]   CC: Chief Complaint  Patient presents with  . PULMONARY CONSULT    former BQ pt. seen for COPD. pt. states breathing is baseline. occ. prod cough clear in color. occ. SOB. occ.chest tightness. denies wheezing, or chest pain      Brief History: Synopsis: She enjoys establish care with the [facility] pulmonary clinic in may of 2014. 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 A CT scan in 2013 performed at [facility] showed some groundglass changes, interstitial thickening, and interlobular thickening in the upper lobes predominantly. This had progressed since 2011. 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 She was also evaluated by [facility] in 2014 and was found to have significant esophageal dysmotility and increased LES tone. She has had a J tube on two separate occasions in the past.  On 07/29/2013 she had lower esophageal Botox injections.   Events since last clinic visit:      Medication:   Current Outpatient Rx  Name  Route  Sig  Dispense  Refill  . albuterol (PROVENTIL HFA;VENTOLIN HFA) 108 (90 BASE) MCG/ACT inhaler   Inhalation   Inhale 2 puffs into the lungs every 6 (six) hours as needed for wheezing or shortness of breath.   1 Inhaler   2     Please use ventolin as this is covered on pt's for ...   . ALPRAZolam (XANAX) 0.25 MG tablet     TAKE 1 TABLET BY MOUTH TWICE A DAY AS NEEDED   60 tablet   0     Not to exceed 5 additional fills before 06/09/2015 ...   . benzonatate (TESSALON) 100 MG capsule   Oral   Take 1 capsule (100 mg total) by mouth every 6 (six) hours as needed for cough.   30 capsule   1   . cetirizine (ZYRTEC) 10 MG tablet      TAKE 1 TABLET (10 MG TOTAL) BY MOUTH DAILY.   30 tablet   11   . chlorpheniramine-HYDROcodone (TUSSIONEX PENNKINETIC ER) 10-8 MG/5ML SUER   Oral   Take 5 mLs by mouth at bedtime as needed for cough.   140 mL   0   . clopidogrel (PLAVIX) 75 MG tablet      TAKE 1 TABLET BY MOUTH IN THE MORNING   30 tablet   5   . Cyanocobalamin (VITAMIN B-12 IJ)   Injection   Inject as directed every 30 (thirty) days.         [name] estradiol (ESTRACE) 1 MG tablet   Oral   Take 1 tablet (1 mg total) by mouth daily.   90 tablet   3   . fluticasone (FLONASE) 50 MCG/ACT nasal spray   Each Nare   Place 2 sprays into both nostrils daily.         . furosemide (LASIX) 20 MG tablet      TAKE 1 TABLET  BY MOUTH ONCE A DAY AS NEEDED   30 tablet   5   . hydroxypropyl methylcellulose (ISOPTO TEARS) 2.5 % ophthalmic solution   Both Eyes   Place 1 drop into both eyes 3 (three) times daily as needed for dry eyes.         . hydroxypropyl methylcellulose / hypromellose (ISOPTO TEARS / GONIOVISC) 2.5 % ophthalmic solution   Ophthalmic   Apply to eye.         . hyoscyamine (LEVSIN SL) 0.125 MG SL tablet   Sublingual   Place 1 tablet (0.125 mg total) under the tongue every 4 (four) hours as needed.   30 tablet   2   . ibuprofen (ADVIL,MOTRIN) 200 MG tablet   Oral   Take 400-600 mg by mouth every 6 (six) hours as needed for mild pain or moderate pain.         [name] ipratropium-albuterol (DUONEB) 0.5-2.5 (3) MG/3ML SOLN   Nebulization   Take 3 mLs by nebulization every 4 (four) hours as needed. Dx 496   360 mL   2   . Linaclotide (LINZESS) 145 MCG CAPS capsule   Oral   Take  1 capsule (145 mcg total) by mouth daily.   30 capsule   3   . magic mouthwash SOLN   Oral   Take 5 mLs by mouth daily.   150 mL   0   . montelukast (SINGULAIR) 10 MG tablet      TAKE 1 TABLET BY MOUTH EVERY DAY   90 tablet   1   . mupirocin ointment (BACTROBAN) 2 %   Nasal   Place 1 application into the nose 2 (two) times daily as needed.         . saccharomyces boulardii (FLORASTOR) 250 MG capsule   Oral   Take 250 mg by mouth. Samples: Taking a few times a week         . sodium chloride (OCEAN) 0.65 % SOLN nasal spray   Each Nare   Place 1 spray into both nostrils as needed for congestion.         . TURMERIC PO   Oral   Take by mouth.         . ValACYclovir HCl POWD   Oral   Take 1 g by mouth 3 (three) times daily.   25 g   0     Pt needs liquid Valtrex. Dose for Zoster. 1gm po t ...   . chlorpheniramine-HYDROcodone (TUSSIONEX PENNKINETIC ER) 10-8 MG/5ML SUER   Oral   Take 5 mLs by mouth at bedtime as needed for cough.   140 mL   0      Review of Systems  Constitutional: Negative for fever, chills, weight loss and malaise/fatigue.  HENT: Positive for sore throat.        Very mild thrush noted at the posterior pharynx  Respiratory: Positive for cough. Negative for sputum production, shortness of breath and wheezing.        Mild chronic cough   Cardiovascular: Negative for chest pain and palpitations.  Gastrointestinal: Negative for heartburn and nausea.  Skin: Negative for rash.  Endo/Heme/Allergies: Does not bruise/bleed easily.      Allergies:  Shellfish allergy; Quinolones; Asa; Avelox; Ciprofloxacin; Eggs or egg-derived products; Influenza vaccines; Levaquin; and Septra  Physical Examination:  VS: BP 118/66 mmHg  Pulse 70  Ht 5' 3.5" (1.613 m)  Wt 112 lb (50.803 kg)  BMI 19.53 kg/m2  SpO2 100%  General Appearance: No distress  HEENT: PERRLA, no ptosis, no other lesions noticed Pulmonary:normal breath sounds., diaphragmatic  excursion normal.No wheezing, No rales   Cardiovascular:  Normal S1,S2.  No m/r/g.     Abdomen:Exam: Benign, Soft, non-tender, No masses  Skin:   warm, no rashes, no ecchymosis  Extremities: normal, no cyanosis, clubbing, warm with normal capillary refill.      Rad results: (The following images and results were reviewed by Dr. Stevenson Clinch on 02/09/2015).  CXR 04/2014 COMPARISON: 06/18/2013.  FINDINGS: Tracheostomy tube noted in good anatomic position. Biapical pleural parenchymal thickening again noted consistent with scarring. No infiltrate. Heart size normal. Surgical clips upper abdomen. No acute bony abnormality .  IMPRESSION: 1. Tracheostomy tube in good anatomic position. 2. Stable biapical pleural parenchymal thickening consistent with scarring. No acute cardiopulmonary disease.     Assessment and Plan: Postinflammatory pulmonary fibrosis She has mild scarring in the apices of her lungs which is due to chronic cough. This is been evaluated extensively with a bronchoscopy with biopsy and workup. There is no evidence of exacerbation based on a recent chest x-ray which I have reviewed.    Tracheostomy dependent Per Dr. Lake Bells - the recommendation is that this never be removed. It has literally saved her hospitalizations and possibly intubations because of her vocal cord dysfunction, COPD and asthma. In fact Dr. Lake Bells do not believe that she has COPD at all. She breathes fine even when sick when the tracheostomy has been uncapped.    Vocal cord dysfunction Severe vocal cord dysfunction that contributes to her symptoms.  Per Dr. 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  She has some chronic scarring in the superior portions of her lungs which was evaluated with a bronchoscopy and biopsy. Fortunately this was negative. If there is evidence of progression of that, then it  would need to be evaluated with an open lung biopsy. However at this time I feel that all of her symptoms come from vocal cord problems. Primarily, I feel that this is vocal cord dysfunction given the intermittent nature.  Plan: - I recommended that the tracheostomy be permanent - cont with usual trach care.     Thrush Improving Very mild at this time  Plan  - cont with magic mouth wash, after thrush is visibly gone, then cont with magic mouth for another 3 days, then stop.     Updated Medication List Outpatient Encounter Prescriptions as of 02/09/2015  Medication Sig  . albuterol (PROVENTIL HFA;VENTOLIN HFA) 108 (90 BASE) MCG/ACT inhaler Inhale 2 puffs into the lungs every 6 (six) hours as needed for wheezing or shortness of breath.  . ALPRAZolam (XANAX) 0.25 MG tablet TAKE 1 TABLET BY MOUTH TWICE A DAY AS NEEDED  . benzonatate (TESSALON) 100 MG capsule Take 1 capsule (100 mg total) by mouth every 6 (six) hours as needed for cough.  . cetirizine (ZYRTEC) 10 MG tablet TAKE 1 TABLET (10 MG TOTAL) BY MOUTH DAILY.  . chlorpheniramine-HYDROcodone (TUSSIONEX PENNKINETIC ER) 10-8 MG/5ML SUER Take 5 mLs by mouth at bedtime as needed for cough.  . clopidogrel (PLAVIX) 75 MG tablet TAKE 1 TABLET BY MOUTH IN THE MORNING  . Cyanocobalamin (VITAMIN B-12 IJ) Inject as directed every 30 (thirty) days.  [name] estradiol (ESTRACE) 1 MG tablet Take 1 tablet (1 mg total) by mouth daily.  . fluticasone (FLONASE) 50 MCG/ACT nasal spray Place 2 sprays into both  nostrils daily.  . furosemide (LASIX) 20 MG tablet TAKE 1 TABLET BY MOUTH ONCE A DAY AS NEEDED  . hydroxypropyl methylcellulose (ISOPTO TEARS) 2.5 % ophthalmic solution Place 1 drop into both eyes 3 (three) times daily as needed for dry eyes.  . hydroxypropyl methylcellulose / hypromellose (ISOPTO TEARS / GONIOVISC) 2.5 % ophthalmic solution Apply to eye.  . hyoscyamine (LEVSIN SL) 0.125 MG SL tablet Place 1 tablet (0.125 mg total) under the tongue  every 4 (four) hours as needed.  [name] ibuprofen (ADVIL,MOTRIN) 200 MG tablet Take 400-600 mg by mouth every 6 (six) hours as needed for mild pain or moderate pain.  [name] ipratropium-albuterol (DUONEB) 0.5-2.5 (3) MG/3ML SOLN Take 3 mLs by nebulization every 4 (four) hours as needed. Dx 496  . Linaclotide (LINZESS) 145 MCG CAPS capsule Take 1 capsule (145 mcg total) by mouth daily.  . magic mouthwash SOLN Take 5 mLs by mouth daily.  . montelukast (SINGULAIR) 10 MG tablet TAKE 1 TABLET BY MOUTH EVERY DAY  . mupirocin ointment (BACTROBAN) 2 % Place 1 application into the nose 2 (two) times daily as needed.  . saccharomyces boulardii (FLORASTOR) 250 MG capsule Take 250 mg by mouth. Samples: Taking a few times a week  . sodium chloride (OCEAN) 0.65 % SOLN nasal spray Place 1 spray into both nostrils as needed for congestion.  . TURMERIC PO Take by mouth.  . ValACYclovir HCl POWD Take 1 g by mouth 3 (three) times daily.  . chlorpheniramine-HYDROcodone (TUSSIONEX PENNKINETIC ER) 10-8 MG/5ML SUER Take 5 mLs by mouth at bedtime as needed for cough.  . [DISCONTINUED] esomeprazole (NEXIUM) 40 MG capsule Take 1 capsule (40 mg total) by mouth daily at 12 noon. (Patient not taking: Reported on 02/09/2015)  . [DISCONTINUED] gabapentin (NEURONTIN) 250 MG/5ML solution Take 2 mLs (100 mg total) by mouth 3 (three) times daily. (Patient not taking: Reported on 02/09/2015)  . [DISCONTINUED] HYDROcodone-acetaminophen (NORCO/VICODIN) 5-325 MG per tablet Take 1 tablet by mouth 2 (two) times daily as needed for moderate pain. (Patient not taking: Reported on 02/09/2015)  . [DISCONTINUED] predniSONE (DELTASONE) 10 MG tablet 40mg  qd X3 days, 30mg  qd X3 days, 20mg  qd X3 days, 10mg  qd X3 days. (Patient not taking: Reported on 02/09/2015)   No facility-administered encounter medications on file as of 02/09/2015.    Orders for this visit: No orders of the defined types were placed in this encounter.    Thank  you for the  visitation and for allowing  [facility] to assist in the care of your patient. Our recommendations are noted above.  Please contact us if we can be of further service.  Vilinda Boehringer, MD [facility] Office Number: [phone]  Note: This note was prepared with Dragon dictation along with smaller phrase technology. Any transcriptional errors that result from this process are unintentional.

## 2015-02-09 NOTE — Assessment & Plan Note (Signed)
She has mild scarring in the apices of her lungs which is due to chronic cough. This is been evaluated extensively with a bronchoscopy with biopsy and workup. There is no evidence of exacerbation based on a recent chest x-ray which I have reviewed. 

## 2015-02-09 NOTE — Assessment & Plan Note (Signed)
Improving Very mild at this time  Plan  - cont with magic mouth wash, after thrush is visibly gone, then cont with magic mouth for another 3 days, then stop.

## 2015-02-09 NOTE — Assessment & Plan Note (Signed)
Severe vocal cord dysfunction that contributes to her symptoms.  Per Dr. 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  She has some chronic scarring in the superior portions of her lungs which was evaluated with a bronchoscopy and biopsy. Fortunately this was negative. If there is evidence of progression of that, then it would need to be evaluated with an open lung biopsy. However at this time I feel that all of her symptoms come from vocal cord problems. Primarily, I feel that this is vocal cord dysfunction given the intermittent nature.  Plan: - I recommended that the tracheostomy be permanent - cont with usual trach care.

## 2015-02-10 ENCOUNTER — Other Ambulatory Visit: Payer: Self-pay

## 2015-02-10 ENCOUNTER — Telehealth: Payer: Self-pay | Admitting: Internal Medicine

## 2015-02-10 ENCOUNTER — Ambulatory Visit (INDEPENDENT_AMBULATORY_CARE_PROVIDER_SITE_OTHER): Payer: Medicare Other

## 2015-02-10 DIAGNOSIS — E538 Deficiency of other specified B group vitamins: Secondary | ICD-10-CM

## 2015-02-10 MED ORDER — EPINEPHRINE 0.3 MG/0.3ML IJ SOAJ: 0.3000 mg | Freq: Once | INTRAMUSCULAR | Status: DC

## 2015-02-10 MED ORDER — CYANOCOBALAMIN 1000 MCG/ML IJ SOLN
1000.0000 ug | Freq: Once | INTRAMUSCULAR | Status: AC
  Administered 2015-02-10: 1000 ug via INTRAMUSCULAR

## 2015-02-10 NOTE — Telephone Encounter (Signed)
Pt came in wanting to get a refill for her epi-pen.. Please advise pt

## 2015-02-10 NOTE — Telephone Encounter (Signed)
Please advise, she came in for a nurse visit and is requesting a new epi pen as her's expired two years ago.  Patient was bit by a wasp this weekend.  She took benadryl and used her Proair inhaler so she didn't need the epi pen, but wants to make sure that she has it for the future.  She wants a generic as the brand name is too expensive.

## 2015-02-10 NOTE — Progress Notes (Signed)
Patient came in for b12 injection.  Received in Left deltoid.  Patient tolerated well  

## 2015-02-10 NOTE — Telephone Encounter (Signed)
Fine to refill 

## 2015-02-10 NOTE — Telephone Encounter (Signed)
completed

## 2015-02-11 ENCOUNTER — Other Ambulatory Visit: Payer: Self-pay | Admitting: Internal Medicine

## 2015-02-15 ENCOUNTER — Telehealth: Payer: Self-pay

## 2015-02-15 ENCOUNTER — Telehealth: Payer: Self-pay | Admitting: *Deleted

## 2015-02-15 MED ORDER — PREDNISONE 5 MG (21) PO TBPK: 5.0000 mg | ORAL_TABLET | Freq: Every day | ORAL | Status: DC

## 2015-02-15 NOTE — Telephone Encounter (Signed)
Please refill last prednisone dose

## 2015-02-15 NOTE — Telephone Encounter (Signed)
Pt states she is congested, cold, achy feeling, dry cough and feels tightness in her chest and in between her shoulder blades. Has been using her Albuterol inhaler and taking Mucinex. States Dr. Lake Bells has called her in Prednisone before. Please advise.

## 2015-02-15 NOTE — Telephone Encounter (Signed)
Clarification given. Nothing further needed.

## 2015-02-15 NOTE — Telephone Encounter (Signed)
pharmacy calling about a refill called in (prednisone)  Written for dose pack and the rx says 1 per day that's not normally like that Do we want her to take 1 a day  Please adivse

## 2015-02-15 NOTE — Telephone Encounter (Signed)
Pt informed prednisone sent to pharmacy. Nothing further needed.

## 2015-02-15 NOTE — Telephone Encounter (Signed)
Pt is calling stating that she is having tightness in Lung area. Its the asthma  Would like to get some prednisone called in Please send to [facility] on [address] Please advise.

## 2015-02-15 NOTE — Telephone Encounter (Signed)
Needs clarification on Prednisone

## 2015-03-09 ENCOUNTER — Telehealth: Payer: Self-pay | Admitting: Internal Medicine

## 2015-03-09 NOTE — Telephone Encounter (Signed)
Pt states she does not have a driver for [id number]. Pts procedure rescheduled at [facility] to 05/04/15@11 :15am. Pt to arrive there at 9:45am. Pt aware of appt.

## 2015-03-12 ENCOUNTER — Telehealth: Payer: Self-pay | Admitting: *Deleted

## 2015-03-12 NOTE — Telephone Encounter (Signed)
Patient called and asked what she could do about the headache that is with her URI, said she had taken ibuprofen.  I suggested that she try tylenol instead.

## 2015-03-15 ENCOUNTER — Ambulatory Visit (INDEPENDENT_AMBULATORY_CARE_PROVIDER_SITE_OTHER): Payer: Medicare Other | Admitting: Nurse Practitioner

## 2015-03-15 ENCOUNTER — Encounter: Payer: Self-pay | Admitting: Nurse Practitioner

## 2015-03-15 VITALS — BP 100/58 | HR 72 | Temp 97.8°F | Ht 63.0 in | Wt 110.0 lb

## 2015-03-15 DIAGNOSIS — J069 Acute upper respiratory infection, unspecified: Secondary | ICD-10-CM

## 2015-03-15 DIAGNOSIS — E538 Deficiency of other specified B group vitamins: Secondary | ICD-10-CM | POA: Diagnosis not present

## 2015-03-15 DIAGNOSIS — B9789 Other viral agents as the cause of diseases classified elsewhere: Principal | ICD-10-CM

## 2015-03-15 MED ORDER — CYANOCOBALAMIN 1000 MCG/ML IJ SOLN: 1000.0000 ug | Freq: Once | INTRAMUSCULAR | Status: DC

## 2015-03-15 MED ORDER — PREDNISONE 5 MG/5ML PO SOLN: 20.0000 mg | Freq: Every day | ORAL | Status: DC

## 2015-03-15 NOTE — Addendum Note (Signed)
Addended by: Rubbie Battiest on: 03/15/2015 10:23 AM   Modules accepted: Miquel Dunn

## 2015-03-15 NOTE — Patient Instructions (Signed)
Take 4 teaspoons (1.3 tablespoons) daily with breakfast x 5 days.   If not improved in 48 hours please call and let me know and I can send in an antibiotic.

## 2015-03-15 NOTE — Assessment & Plan Note (Signed)
B12 injection given today to save her a trip tomorrow.

## 2015-03-15 NOTE — Addendum Note (Signed)
Addended by: Carilyn Goodpasture on: 03/15/2015 10:34 AM   Modules accepted: Orders

## 2015-03-15 NOTE — Assessment & Plan Note (Signed)
1.5 weeks of symptoms. Pt has tussionex at home and has been treating for this. Prednisone solution sent to pharmacy. 20 mg equivalent is 4 tsp or 1.3 tbsp in the morning with breakfast. Pt was told to do this for 5 days. Let us know by phone if not helpful in 48 hours or if fever of 100.4 or greater. Pt verbalized understanding.

## 2015-03-15 NOTE — Progress Notes (Signed)
Patient ID: Anne [name], female    DOB: March 23, 1951  Age: 64 y.o. MRN: [id number]  CC: URI   HPI Anne [name] presents for CC of URI x 1.5 weeks.   1) Pt reports sinus pressure, HA, cough, fatigue.  Maxillary pressure, achy shoulder, nauseated  HA, achiness, nausea worst part of this   Treatment to date:  Mucinex- helpful  Tylenol- helpful  Saline Flonase Tussionex- helpful   Sick Contacts- [facility] [place] has a past medical history of Asthma; COPD (chronic obstructive pulmonary disease) ([place]); Stroke [facility]); Migraine; Allergic rhinitis; Anemia; IBS (irritable bowel syndrome); Shingles; Tracheostomy in place [facility]); Complication of anesthesia; Pulmonary fibrosis ([place]); Vocal cord paresis; Heart murmur; CVA (cerebral infarction); and Esophageal dysmotility.   She has past surgical history that includes Abdominal hysterectomy; Tubal ligation; Tracheostomy (1996); Video bronchoscopy (Bilateral, 11/20/2012); Jejunostomy feeding tube; Esophageal manometry (N/A, 12/16/2012); Eye surgery; Eye Surgery AS Child; Esophagogastroduodenoscopy (egd) with propofol (N/A, 07/29/2013); and Botox injection (N/A, 07/29/2013).   Her family history includes Asthma in her cousin and father; Breast cancer (age of onset: 64) in her maternal aunt; Breast cancer (age of onset: 52) in her maternal grandmother; COPD in her cousin and paternal grandfather; Cancer in her father, maternal aunt, maternal grandmother, and paternal uncle.She reports that she has never smoked. She has never used smokeless tobacco. She reports that she does not drink alcohol or use illicit drugs.  Outpatient Prescriptions Prior to Visit  Medication Sig Dispense Refill  . albuterol (PROVENTIL HFA;VENTOLIN HFA) 108 (90 BASE) MCG/ACT inhaler Inhale 2 puffs into the lungs every 6 (six) hours as needed for wheezing or shortness of breath. 1 Inhaler 2  . ALPRAZolam (XANAX) 0.25 MG tablet TAKE 1 TABLET BY MOUTH TWICE A DAY AS  NEEDED (Patient taking differently: TAKE 1/2 TABLET BY MOUTH TWICE A DAY) 60 tablet 0  . cetirizine (ZYRTEC) 10 MG tablet TAKE 1 TABLET (10 MG TOTAL) BY MOUTH DAILY. 30 tablet 11  . clopidogrel (PLAVIX) 75 MG tablet TAKE 1 TABLET BY MOUTH IN THE MORNING 30 tablet 5  . Cyanocobalamin (VITAMIN B-12 IJ) Inject as directed every 30 (thirty) days.    [name] EPINEPHrine (EPIPEN 2-PAK) 0.3 mg/0.3 mL IJ SOAJ injection Inject 0.3 mLs (0.3 mg total) into the muscle once. (Patient taking differently: Inject 0.3 mg into the muscle once as needed (anaphylaxis). ) 1 Device 0  . estradiol (ESTRACE) 1 MG tablet Take 1 tablet (1 mg total) by mouth daily. 90 tablet 3  . fluticasone (FLONASE) 50 MCG/ACT nasal spray Place 2 sprays into both nostrils daily as needed for allergies.     . furosemide (LASIX) 20 MG tablet TAKE 1 TABLET BY MOUTH ONCE A DAY AS NEEDED (Patient taking differently: TAKE 1 TABLET BY MOUTH ONCE A DAY) 30 tablet 5  . hydroxypropyl methylcellulose (ISOPTO TEARS) 2.5 % ophthalmic solution Place 1 drop into both eyes 3 (three) times daily as needed for dry eyes.    . hyoscyamine (LEVSIN SL) 0.125 MG SL tablet Place 1 tablet (0.125 mg total) under the tongue every 4 (four) hours as needed. (Patient taking differently: Place 0.125 mg under the tongue every 4 (four) hours as needed for cramping. ) 30 tablet 2  . ibuprofen (ADVIL,MOTRIN) 200 MG tablet Take 400-600 mg by mouth every 6 (six) hours as needed for mild pain or moderate pain.    [name] ipratropium-albuterol (DUONEB) 0.5-2.5 (3) MG/3ML SOLN Take 3 mLs by nebulization every 4 (four) hours as  needed. Dx 496 (Patient taking differently: Take 3 mLs by nebulization every 4 (four) hours as needed (difficulty breathing). ) 360 mL 2  . montelukast (SINGULAIR) 10 MG tablet TAKE 1 TABLET BY MOUTH EVERY DAY 90 tablet 1  . mupirocin ointment (BACTROBAN) 2 % Apply 1 application topically daily. To keep trach site from getting irritated    . sodium chloride (OCEAN) 0.65 %  SOLN nasal spray Place 1 spray into both nostrils every 4 (four) hours as needed for congestion.     [name] HYDROcodone-acetaminophen (NORCO/VICODIN) 5-325 MG tablet Take 0.5 tablets by mouth 2 (two) times daily as needed for severe pain. Reported on 03/15/2015    . benzonatate (TESSALON) 100 MG capsule Take 1 capsule (100 mg total) by mouth every 6 (six) hours as needed for cough. (Patient not taking: Reported on 03/15/2015) 30 capsule 1  . chlorpheniramine-HYDROcodone (TUSSIONEX PENNKINETIC ER) 10-8 MG/5ML SUER Take 5 mLs by mouth at bedtime as needed for cough. (Patient not taking: Reported on 03/15/2015) 140 mL 0  . Linaclotide (LINZESS) 145 MCG CAPS capsule Take 1 capsule (145 mcg total) by mouth daily. (Patient not taking: Reported on 03/15/2015) 30 capsule 3   No facility-administered medications prior to visit.    ROS Review of Systems  Constitutional: Negative for fever, chills, diaphoresis and fatigue.  HENT: Positive for congestion, postnasal drip, rhinorrhea, sinus pressure and sore throat. Negative for ear pain.   Respiratory: Negative for chest tightness, shortness of breath and wheezing.   Cardiovascular: Negative for chest pain, palpitations and leg swelling.  Gastrointestinal: Negative for nausea, vomiting and diarrhea.  Skin: Negative for rash.  Neurological: Positive for headaches. Negative for dizziness, weakness and numbness.  Psychiatric/Behavioral: The patient is not nervous/anxious.     Objective:  BP 100/58 mmHg  Pulse 72  Temp(Src) 97.8 F (36.6 C)  Ht 5\' 3"  (1.6 m)  Wt 110 lb (49.896 kg)  BMI 19.49 kg/m2  SpO2 99%  Physical Exam  Constitutional: She is oriented to person, place, and time. She appears well-developed and well-nourished. No distress.  HENT:  Head: Normocephalic and atraumatic.  Right Ear: External ear normal.  Left Ear: External ear normal.  TM's clear bilaterally  Eyes: EOM are normal. Pupils are equal, round, and reactive to light. Right eye  exhibits no discharge. Left eye exhibits no discharge. No scleral icterus.  Glasses  Neck: Normal range of motion. Neck supple.  Cardiovascular: Normal rate, regular rhythm and normal heart sounds.  Exam reveals no gallop and no friction rub.   No murmur heard. Pulmonary/Chest: Effort normal and breath sounds normal. No respiratory distress. She has no wheezes. She has no rales. She exhibits no tenderness.  Lymphadenopathy:    She has no cervical adenopathy.  Neurological: She is alert and oriented to person, place, and time. No cranial nerve deficit. She exhibits normal muscle tone. Coordination normal.  Skin: Skin is warm and dry. No rash noted. She is not diaphoretic.  Psychiatric: She has a normal mood and affect. Her behavior is normal. Judgment and thought content normal.   Assessment & Plan:   Anne [name] was seen today for uri.  Diagnoses and all orders for this visit:  Viral URI with cough  Other orders -     predniSONE 5 MG/5ML solution; Take 20 mLs (20 mg total) by mouth daily with breakfast.  I have discontinued Anne [name]'s benzonatate, Linaclotide, and chlorpheniramine-HYDROcodone. I am also having her start on predniSONE. Additionally, I am having her maintain her Cyanocobalamin (  VITAMIN B-12 IJ), ibuprofen, hydroxypropyl methylcellulose / hypromellose, ipratropium-albuterol, sodium chloride, fluticasone, albuterol, mupirocin ointment, cetirizine, montelukast, estradiol, hyoscyamine, ALPRAZolam, EPINEPHrine, clopidogrel, furosemide, and HYDROcodone-acetaminophen.  Meds ordered this encounter  Medications  . predniSONE 5 MG/5ML solution    Sig: Take 20 mLs (20 mg total) by mouth daily with breakfast.    Dispense:  120 mL    Refill:  0    Order Specific Question:  Supervising Provider    Answer:  Crecencio Mc [[id number]]     Follow-up: Return if symptoms worsen or fail to improve.

## 2015-03-16 ENCOUNTER — Ambulatory Visit: Payer: Medicare Other

## 2015-03-16 ENCOUNTER — Telehealth: Payer: Self-pay | Admitting: *Deleted

## 2015-03-16 NOTE — Telephone Encounter (Signed)
I have a float person and I think she ordered it instead of doing the office immunization. This is just a mix-up. She will continue to come in monthly per her PCP. Thanks for clarifying

## 2015-03-16 NOTE — Telephone Encounter (Signed)
Called patient to clarify and she verbalized understanding that she received her B12 injection while in the office yesterday.  However, she refused the B12 injections ordered and sent to the pharmacy because she is use to coming in once a month and having the nurse give the injection.  She was having trouble understanding why things had changed.  Encouraged her to continue coming to the office as scheduled every 30 days for B12 injections until further notified.

## 2015-03-16 NOTE — Telephone Encounter (Signed)
Patient was seen, yesterday and was given an injection,in which she thought was a B12 injection. Patient went to the  pharmacy to pick up medications and was give B12 injections. Patient requested a call back with concerns, she stated that she was not  direction on how to use the B12. Please Advise

## 2015-03-17 ENCOUNTER — Other Ambulatory Visit: Payer: Self-pay | Admitting: Nurse Practitioner

## 2015-03-17 ENCOUNTER — Telehealth: Payer: Self-pay | Admitting: Internal Medicine

## 2015-03-17 MED ORDER — AMOXICILLIN-POT CLAVULANATE 400-57 MG PO CHEW: 1.0000 | CHEWABLE_TABLET | Freq: Two times a day (BID) | ORAL | Status: DC

## 2015-03-17 NOTE — Telephone Encounter (Signed)
Dr. Gilford Rile has done chewable Augmentin in the recent past (2015) so I will repeat this. I sent it to her pharmacy. Thanks!

## 2015-03-17 NOTE — Telephone Encounter (Signed)
Anne [name], you saw this patient on 12/19.  Gave Prednisone and told to use OTC meds.  Please advise?

## 2015-03-17 NOTE — Telephone Encounter (Signed)
Pt called about still being very congested and would like a antibiotic. Pt states she would like amoxicillin. Pharmacy is  [id number] [address], [place]. Thank You!

## 2015-03-17 NOTE — Telephone Encounter (Signed)
Attempted to call the patient.  Left her a detailed message that a antibiotic has been sent in to the pharmacy.

## 2015-03-26 ENCOUNTER — Other Ambulatory Visit: Payer: Self-pay | Admitting: Internal Medicine

## 2015-04-14 ENCOUNTER — Ambulatory Visit (INDEPENDENT_AMBULATORY_CARE_PROVIDER_SITE_OTHER): Payer: Medicare Other | Admitting: Family Medicine

## 2015-04-14 ENCOUNTER — Encounter: Payer: Self-pay | Admitting: Family Medicine

## 2015-04-14 VITALS — BP 102/66 | HR 90 | Temp 97.7°F | Ht 63.0 in | Wt 110.4 lb

## 2015-04-14 DIAGNOSIS — J0100 Acute maxillary sinusitis, unspecified: Secondary | ICD-10-CM | POA: Insufficient documentation

## 2015-04-14 DIAGNOSIS — E538 Deficiency of other specified B group vitamins: Secondary | ICD-10-CM | POA: Diagnosis not present

## 2015-04-14 MED ORDER — PREDNISONE 10 MG PO TABS: 40.0000 mg | ORAL_TABLET | Freq: Every day | ORAL | Status: DC

## 2015-04-14 MED ORDER — AMOXICILLIN 250 MG/5ML PO SUSR: 500.0000 mg | Freq: Two times a day (BID) | ORAL | Status: DC

## 2015-04-14 MED ORDER — CYANOCOBALAMIN 1000 MCG/ML IJ SOLN
1000.0000 ug | Freq: Once | INTRAMUSCULAR | Status: AC
  Administered 2015-04-14: 1000 ug via INTRAMUSCULAR

## 2015-04-14 MED ORDER — ALBUTEROL SULFATE (2.5 MG/3ML) 0.083% IN NEBU
2.5000 mg | INHALATION_SOLUTION | Freq: Once | RESPIRATORY_TRACT | Status: AC
  Administered 2015-04-14: 2.5 mg via RESPIRATORY_TRACT

## 2015-04-14 NOTE — Progress Notes (Signed)
Pre visit review using our clinic review tool, if applicable. No additional management support is needed unless otherwise documented below in the visit note. 

## 2015-04-14 NOTE — Progress Notes (Signed)
Patient ID: TOBY ANDROS, female   DOB: [birth year], 65 y.o.   MRN: [id number]  Tommi Rumps, MD Phone: [phone]  Anne [name] is a 65 y.o. female who presents today for same-day visit.  Patient notes 5-6 days of sinus congestion and pressure, ear fullness, mild wheezing and burning sensation with breathing. She denies chest pain. Minimal shortness of breath. Minimal cough that is nonproductive. Has now been using her albuterol very infrequently. She's tried over-the-counter treatments with little benefit. She does have sick contacts at home. She was treated for similar illness in December with Augmentin and prednisone. She notes she completely improved following treatment with those medications. Reports a history of asthma.  PMH: nonsmoker.   ROS see history of present illness  Objective  Physical Exam Filed Vitals:   04/14/15 1320  BP: 102/66  Pulse: 90  Temp: 97.7 F (36.5 C)    Physical Exam  Constitutional: She is well-developed, well-nourished, and in no distress.  HENT:  Head: Normocephalic and atraumatic.  Right Ear: External ear normal.  Left Ear: External ear normal.  Mouth/Throat: Oropharynx is clear and moist. No oropharyngeal exudate.  Normal TMs bilaterally, maxillary sinuses tender to percussion  Eyes: Conjunctivae are normal. Pupils are equal, round, and reactive to light.  Neck: Neck supple.  Cardiovascular: Normal rate, regular rhythm and normal heart sounds.  Exam reveals no gallop and no friction rub.   No murmur heard. Pulmonary/Chest: Effort normal. No respiratory distress. She has wheezes (scattered expiratory wheezes). She has no rales.  Lymphadenopathy:    She has no cervical adenopathy.  Neurological: She is alert. Gait normal.  Skin: Skin is warm and dry. She is not diaphoretic.   Patient was given an albuterol nebulizer treatment in the office and reported significant improvement in the chest congestion and burning sensation. Her air  movement was improved and wheezes were improved as well.  Assessment/Plan: Please see individual problem list.  Sinusitis, acute maxillary Patient's symptoms consistent with acute maxillary sinusitis given duration and severity of symptoms. Also appears to have some measure of asthma exacerbation with wheezing. She was given an albuterol nebulizer treatment in the office and had improvement in her symptoms. We will treat her with amoxicillin as she has tolerated this and responded well to this in the past for sinus infections. We will treat asthma exacerbation with prednisone. She'll use her albuterol nebulizer every 4 hours at home for the next 2 days and then as needed. She will continue to monitor. She is given return precautions.    Meds ordered this encounter  Medications  . predniSONE (DELTASONE) 10 MG tablet    Sig: Take 4 tablets (40 mg total) by mouth daily with breakfast.    Dispense:  20 tablet    Refill:  0  . amoxicillin (AMOXIL) 250 MG/5ML suspension    Sig: Take 10 mLs (500 mg total) by mouth 2 (two) times daily.    Dispense:  150 mL    Refill:  0    Tommi Rumps

## 2015-04-14 NOTE — Assessment & Plan Note (Addendum)
Patient's symptoms consistent with acute maxillary sinusitis given duration and severity of symptoms. Also appears to have some measure of asthma exacerbation with wheezing. She was given an albuterol nebulizer treatment in the office and had improvement in her symptoms. We will treat her with amoxicillin as she has tolerated this and responded well to this in the past for sinus infections. We will treat asthma exacerbation with prednisone. She'll use her albuterol nebulizer every 4 hours at home for the next 2 days and then as needed. She will continue to monitor. She is given return precautions.

## 2015-04-14 NOTE — Patient Instructions (Addendum)
Nice to meet you. Your symptoms are likely related a sinus infection and possible asthma exacerbation. We will treat you with amoxicillin and prednisone. You should additionally use her albuterol nebulizer every 4 hours for the next 2 days. If you develop shortness of breath, chest pain, cough productive of blood, fevers, or any new or change in symptoms please seek medical attention.

## 2015-04-15 ENCOUNTER — Ambulatory Visit: Payer: Medicare Other

## 2015-04-21 ENCOUNTER — Telehealth: Payer: Self-pay | Admitting: *Deleted

## 2015-04-21 ENCOUNTER — Encounter (HOSPITAL_COMMUNITY): Payer: Self-pay | Admitting: *Deleted

## 2015-04-21 NOTE — Telephone Encounter (Signed)
I would recommend trying a silicone based lubricant. This would include KY or Astroglide. I have also had patients with some improvement with Refresh. If these are not helpful, we can discuss prescription creams.

## 2015-04-21 NOTE — Telephone Encounter (Signed)
Patient has requested a call in return in reference to her vaginal dryness. She stated that she has some cracking due to skin being thin. She requested a suggestion on cream she should use for this issue.  Contact [phone]

## 2015-04-21 NOTE — Telephone Encounter (Signed)
Please advise 

## 2015-04-26 ENCOUNTER — Encounter: Payer: Self-pay | Admitting: Internal Medicine

## 2015-04-27 MED ORDER — ZOLMITRIPTAN 5 MG PO TBDP: 5.0000 mg | ORAL_TABLET | ORAL | Status: DC | PRN

## 2015-05-04 ENCOUNTER — Ambulatory Visit (HOSPITAL_COMMUNITY)
Admission: RE | Admit: 2015-05-04 | Discharge: 2015-05-04 | Disposition: A | Discharge status: Home / Self Care | Payer: Medicare Other | Source: Ambulatory Visit | Attending: Internal Medicine | Admitting: Internal Medicine

## 2015-05-04 ENCOUNTER — Ambulatory Visit (HOSPITAL_COMMUNITY): Payer: Medicare Other | Admitting: Anesthesiology

## 2015-05-04 ENCOUNTER — Encounter (HOSPITAL_COMMUNITY): Payer: Self-pay | Admitting: Anesthesiology

## 2015-05-04 ENCOUNTER — Encounter (HOSPITAL_COMMUNITY)
Admission: RE | Disposition: A | Discharge status: Home / Self Care | Payer: Self-pay | Source: Ambulatory Visit | Attending: Internal Medicine

## 2015-05-04 DIAGNOSIS — G43909 Migraine, unspecified, not intractable, without status migrainosus: Secondary | ICD-10-CM | POA: Insufficient documentation

## 2015-05-04 DIAGNOSIS — Z8673 Personal history of transient ischemic attack (TIA), and cerebral infarction without residual deficits: Secondary | ICD-10-CM | POA: Diagnosis not present

## 2015-05-04 DIAGNOSIS — Z930 Tracheostomy status: Secondary | ICD-10-CM | POA: Diagnosis not present

## 2015-05-04 DIAGNOSIS — K2970 Gastritis, unspecified, without bleeding: Secondary | ICD-10-CM | POA: Diagnosis not present

## 2015-05-04 DIAGNOSIS — K220 Achalasia of cardia: Secondary | ICD-10-CM | POA: Diagnosis not present

## 2015-05-04 DIAGNOSIS — J449 Chronic obstructive pulmonary disease, unspecified: Secondary | ICD-10-CM | POA: Diagnosis not present

## 2015-05-04 DIAGNOSIS — K319 Disease of stomach and duodenum, unspecified: Secondary | ICD-10-CM | POA: Diagnosis not present

## 2015-05-04 DIAGNOSIS — K224 Dyskinesia of esophagus: Secondary | ICD-10-CM | POA: Insufficient documentation

## 2015-05-04 DIAGNOSIS — J45909 Unspecified asthma, uncomplicated: Secondary | ICD-10-CM | POA: Insufficient documentation

## 2015-05-04 DIAGNOSIS — J8410 Pulmonary fibrosis, unspecified: Secondary | ICD-10-CM | POA: Diagnosis not present

## 2015-05-04 DIAGNOSIS — G709 Myoneural disorder, unspecified: Secondary | ICD-10-CM | POA: Insufficient documentation

## 2015-05-04 HISTORY — PX: ESOPHAGOGASTRODUODENOSCOPY (EGD) WITH PROPOFOL: SHX5813

## 2015-05-04 HISTORY — PX: BOTOX INJECTION: SHX5754

## 2015-05-04 HISTORY — DX: Dysphagia, unspecified: R13.10

## 2015-05-04 SURGERY — ESOPHAGOGASTRODUODENOSCOPY (EGD) WITH PROPOFOL
Anesthesia: Monitor Anesthesia Care

## 2015-05-04 MED ORDER — PROPOFOL 10 MG/ML IV BOLUS
INTRAVENOUS | Status: DC | PRN
  Administered 2015-05-04: 20 mg via INTRAVENOUS
  Administered 2015-05-04: 40 mg via INTRAVENOUS
  Administered 2015-05-04 (×3): 20 mg via INTRAVENOUS

## 2015-05-04 MED ORDER — ONABOTULINUMTOXINA 100 UNITS IJ SOLR: 100.0000 [IU] | Freq: Once | INTRAMUSCULAR | Status: DC

## 2015-05-04 MED ORDER — LIDOCAINE HCL (CARDIAC) 20 MG/ML IV SOLN
INTRAVENOUS | Status: DC | PRN
  Administered 2015-05-04: 50 mg via INTRAVENOUS

## 2015-05-04 MED ORDER — PROPOFOL 10 MG/ML IV BOLUS
INTRAVENOUS | Status: AC
  Filled 2015-05-04: qty 20

## 2015-05-04 MED ORDER — SODIUM CHLORIDE 0.9 % IJ SOLN
INTRAMUSCULAR | Status: DC | PRN
  Administered 2015-05-04: 4 mL via SUBMUCOSAL

## 2015-05-04 MED ORDER — LIDOCAINE HCL (CARDIAC) 20 MG/ML IV SOLN
INTRAVENOUS | Status: AC
  Filled 2015-05-04: qty 5

## 2015-05-04 MED ORDER — ONABOTULINUMTOXINA 100 UNITS IJ SOLR
INTRAMUSCULAR | Status: AC
  Filled 2015-05-04: qty 100

## 2015-05-04 MED ORDER — SODIUM CHLORIDE 0.9 % IV SOLN: INTRAVENOUS | Status: DC

## 2015-05-04 MED ORDER — LACTATED RINGERS IV SOLN
INTRAVENOUS | Status: DC | PRN
  Administered 2015-05-04: 11:00:00 via INTRAVENOUS

## 2015-05-04 MED ORDER — SODIUM CHLORIDE 0.9 % IJ SOLN
100.0000 [IU] | Freq: Once | INTRAMUSCULAR | Status: DC
  Filled 2015-05-04: qty 100

## 2015-05-04 SURGICAL SUPPLY — 14 items

## 2015-05-04 NOTE — Transfer of Care (Signed)
Immediate Anesthesia Transfer of Care Note  Patient: Anne [name]  Procedure(s) Performed: Procedure(s): ESOPHAGOGASTRODUODENOSCOPY (EGD) WITH PROPOFOL (N/A) BOTOX INJECTION (N/A)  Patient Location: PACU  Anesthesia Type:MAC  Level of Consciousness: awake, alert  and oriented  Airway & Oxygen Therapy: Patient Spontanous Breathing and Patient connected to face mask oxygen  Post-op Assessment: Report given to RN and Post -op Vital signs reviewed and stable  Post vital signs: Reviewed and stable  Last Vitals:  Filed Vitals:   05/04/15 1033  BP: 124/46  Pulse: 85  Temp: 36.6 C  Resp: 14    Complications: No apparent anesthesia complications

## 2015-05-04 NOTE — Discharge Instructions (Signed)

## 2015-05-04 NOTE — H&P (Signed)
HPI: Anne [name] is a 65 yo female with PMH of esophageal dysphagia, hypertensive LES, pulmonary fibrosis with asthma complicated by aspiration, vocal cord dysfunction, permanent tracheostomy, history of TIA and migraines who is here for outpatient upper endoscopy. For hypertensive LES she had Botox injection on 07/29/2013. Initially she was not sure this helped however in retrospect she feels that it actually did help with swallowing. She is requesting repeat Botox treatment today. Overall she's been feeling well. She was treated with antibiotics approximately one month ago for bronchitis. Constipation comes and goes but on the whole has been better. She is no longer using Zest. No recent nausea or vomiting. No fevers or chills. No shortness of breath or chest pain today.  Past Medical History  Diagnosis Date  . Asthma   . COPD (chronic obstructive pulmonary disease) ([place])   . Stroke [facility])     slurred speech, drawn face, imaging normal, occurred twice, [facility]  . Migraine   . Allergic rhinitis   . Anemia   . IBS (irritable bowel syndrome)   . Shingles   . Tracheostomy in place [facility])   . Pulmonary fibrosis ([place])   . Vocal cord paresis   . Heart murmur     as child  . CVA (cerebral infarction)   . Esophageal dysmotility   . Complication of anesthesia     Breathing problems. 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  . Problems with swallowing     intermittently    Past Surgical History  Procedure Laterality Date  . Abdominal hysterectomy    . Tubal ligation    . Tracheostomy  1996    done at [facility], Dr. Kathyrn Sheriff  . Video bronchoscopy Bilateral 11/20/2012    Procedure: VIDEO BRONCHOSCOPY WITH FLUORO;  Surgeon: Juanito Doom, MD;  Location: [facility] END[facility]COPY;  Service: Cardiopulmonary;  Laterality: Bilateral;  . Jejunostomy feeding tube      x2 both failed. no longer has  . Esophageal manometry N/A 12/16/2012    Procedure: ESOPHAGEAL MANOMETRY (EM);  Surgeon: Jerene Bears, MD;  Location: [facility]  ENDOSCOPY;  Service: Gastroenterology;  Laterality: N/A;  . Eye surgery      Catarct surgery 2014  . Eye surgery as child    . Esophagogastroduodenoscopy (egd) with propofol N/A 07/29/2013    Procedure: ESOPHAGOGASTRODUODENOSCOPY (EGD) WITH PROPOFOL;  Surgeon: Jerene Bears, MD;  Location: [facility] END[facility];  Service: Gastroenterology;  Laterality: N/A;  with botox injection  . Botox injection N/A 07/29/2013    Procedure: BOTOX INJECTION;  Surgeon: Jerene Bears, MD;  Location: [facility] END[facility];  Service: Gastroenterology;  Laterality: N/A;     (Not in an outpatient encounter)  Allergies  Allergen Reactions  . Influenza Vaccines Shortness Of Breath    Also causes her to have achy, flu-like symptoms  . Shellfish Allergy Anaphylaxis  . Quinolones     Feet swell  . Asa [Aspirin]     Tongue swelling  . Avelox [Moxifloxacin]     Increased heart rate  . Ciprofloxacin Swelling    ALL OF THE "FLOXACIN"  . Levaquin [Levofloxacin In D5w]     Aches and swelling  . Septra [Sulfamethoxazole-Trimethoprim] Diarrhea  . Eggs Or Egg-Derived Products Rash    GI Upset    Family History  Problem Relation Age of Onset  . Asthma Cousin   . COPD Cousin   . Breast cancer Maternal Grandmother 60  . Cancer Maternal Grandmother     breast  . Breast cancer Maternal Aunt 54  .  Cancer Maternal Aunt     breast  . Asthma Father   . Cancer Father     lung  . Cancer Paternal Uncle     lung  . COPD Paternal Grandfather     Social History  Substance Use Topics  . Smoking status: Never Smoker   . Smokeless tobacco: Never Used  . Alcohol Use: No    ROS: As per history of present illness, otherwise negative  BP 124/46 mmHg  Pulse 85  Temp(Src) 97.8 F (36.6 C) (Oral)  Resp 14  Ht 5\' 3"  (1.6 m)  Wt 110 lb (49.896 kg)  BMI 19.49 kg/m2  SpO2 100% Gen: awake, alert, NAD HEENT: anicteric, op clear, Tracheostomy in place and capped  CV: RRR, no mrg Pulm: CTA b/l Abd: soft, NT/ND, +BS throughout Ext: no  c/c/e Neuro: nonfocal   RELEVANT LABS AND IMAGING: CBC    Component Value Date/Time   WBC 4.3 02/17/2014 1303   WBC 4.5 09/30/2013 1015   RBC 3.92 02/17/2014 1303   RBC 4.09 09/30/2013 1015   HGB 12.6 02/17/2014 1303   HGB 12.8 09/30/2013 1015   HCT 37.9 02/17/2014 1303   HCT 38.6 09/30/2013 1015   PLT 208 02/17/2014 1303   PLT 220.0 09/30/2013 1015   MCV 97 02/17/2014 1303   MCV 94.4 09/30/2013 1015   MCH 32.3 02/17/2014 1303   MCHC 33.4 02/17/2014 1303   MCHC 33.1 09/30/2013 1015   RDW 12.5 02/17/2014 1303   RDW 13.3 09/30/2013 1015   LYMPHSABS 1.2 02/17/2014 1303   LYMPHSABS 1.2 09/30/2013 1015   MONOABS 0.3 02/17/2014 1303   MONOABS 0.4 09/30/2013 1015   EOSABS 0.2 02/17/2014 1303   EOSABS 0.3 09/30/2013 1015   BASOSABS 0.0 02/17/2014 1303   BASOSABS 0.0 09/30/2013 1015    CMP     Component Value Date/Time   NA 140 04/28/2014 0935   K 3.8 04/28/2014 0935   K 3.6 04/03/2012 1653   CL 104 04/28/2014 0935   CO2 30 04/28/2014 0935   GLUCOSE 80 04/28/2014 0935   BUN 15 04/28/2014 0935   CREATININE 1.01 04/28/2014 0935   CALCIUM 9.6 04/28/2014 0935   PROT 6.8 04/28/2014 0935   ALBUMIN 4.2 04/28/2014 0935   AST 17 04/28/2014 0935   ALT 12 04/28/2014 0935   ALKPHOS 79 04/28/2014 0935   BILITOT 0.6 04/28/2014 0935    ASSESSMENT/PLAN:  65 yo female with PMH of esophageal dysphagia, hypertensive LES, pulmonary fibrosis with asthma complicated by aspiration, vocal cord dysfunction, permanent tracheostomy, history of TIA and migraines who is here for outpatient upper endoscopy.  1. Esophageal dysmotility/hypertensive LES  -- patient is interested in presents for repeat upper endoscopy with Botox injection to the LES today. Plavix is been held properly 5 days. The nature of the procedure, as well as the risks, benefits, and alternatives were carefully and thoroughly reviewed with the patient. Ample time for discussion and questions allowed. The patient understood, was  satisfied, and agreed to proceed.

## 2015-05-04 NOTE — Anesthesia Postprocedure Evaluation (Signed)
Anesthesia Post Note  Patient: Anne [name]  Procedure(s) Performed: Procedure(s) (LRB): ESOPHAGOGASTRODUODENOSCOPY (EGD) WITH PROPOFOL (N/A) BOTOX INJECTION (N/A)  Patient location during evaluation: Endoscopy Anesthesia Type: MAC Level of consciousness: awake, awake and alert, oriented and patient cooperative Pain management: pain level controlled Vital Signs Assessment: post-procedure vital signs reviewed and stable Respiratory status: spontaneous breathing and respiratory function stable Cardiovascular status: blood pressure returned to baseline Anesthetic complications: no    Last Vitals:  Filed Vitals:   05/04/15 1300 05/04/15 1310  BP: 105/51 105/44  Pulse: 76 72  Temp:    Resp: 23 21    Last Pain: There were no vitals filed for this visit.               Natividad Halls EDWARD

## 2015-05-04 NOTE — Op Note (Signed)
[facility] [place], [id number]   ENDOSCOPY PROCEDURE REPORT  PATIENT: Anne [name], Anne [name]  MR#: [id number] BIRTHDATE: [birth year] , 50  yrs. old GENDER: female ENDOSCOPIST: Jerene Bears, MD PROCEDURE DATE:  05/04/2015 PROCEDURE:  EGD w/ biopsy for H.pylori and EGD w/ directed submucosal injection(s), any substance ASA CLASS:     Class III INDICATIONS:  dysphagia, esophageal dysmotility and history of hypertensive LES Responsive to Botox injection in May 2015. MEDICATIONS: Monitored anesthesia care, Per Anesthesia , and Botox 100 U TOPICAL ANESTHETIC: none  DESCRIPTION OF PROCEDURE: After the risks benefits and alternatives of the procedure were thoroughly explained, informed consent was obtained.  The Pentax Gastroscope [id number] endoscope was introduced through the mouth and advanced to the second portion of the duodenum , Without limitations.  The instrument was slowly withdrawn as the mucosa was fully examined.   ESOPHAGUS: The mucosa of the esophagus appeared normal.   No resistance require to pass LES today.  No esophagitis.  Given hypertensive LES by manometry and response to previous Botox, four-quadrant Botox injection with 25 units/mL, was injected 1 mL per injection at LES.  STOMACH: Mild gastritis (inflammation) was found in the gastric antrum.  Cold forcep biopsies were taken at the gastric body, antrum and angularis to evaluate for h.  pylori.  DUODENUM: The duodenal mucosa showed no abnormalities in the bulb and 2nd part of the duodenum. Retroflexed views revealed no abnormalities.     The scope was then withdrawn from the patient and the procedure completed.  COMPLICATIONS: There were no immediate complications.  ENDOSCOPIC IMPRESSION: 1.   The mucosa of the esophagus appeared normal; four-quadrant Botox injection to LES 2.   Gastritis (inflammation) was found in the gastric antrum; multiple biopsies 3.   The duodenal mucosa  showed no abnormalities in the bulb and 2nd part of the duodenum  RECOMMENDATIONS: 1.  Continue daily PPI 2.  Await response to Botox injection today 3.  Follow-up pathology results from gastric biopsies 4.  Office follow-up in 2-3 months 5.  Resume Plavix tomorrow with usual dose eSigned:  Jerene Bears, MD 05/04/2015 12:27 PM CC: the patient, Dr. Gilford Rile

## 2015-05-04 NOTE — Anesthesia Preprocedure Evaluation (Signed)
Anesthesia Evaluation  Patient identified by MRN, date of birth, ID band  Reviewed: Allergy & Precautions, NPO status , Patient's Chart, lab work & pertinent test results, Unable to perform ROS - Chart review only  Airway Mallampati: Trach  TM Distance: >3 FB     Dental   Pulmonary asthma , COPD,     + decreased breath sounds      Cardiovascular Normal cardiovascular exam     Neuro/Psych  Headaches,  Neuromuscular disease CVA, Residual Symptoms    GI/Hepatic   Endo/Other    Renal/GU      Musculoskeletal   Abdominal   Peds  Hematology  (+) anemia ,   Anesthesia Other Findings Trach  Reproductive/Obstetrics                             Anesthesia Physical Anesthesia Plan  ASA: IV  Anesthesia Plan: MAC   Post-op Pain Management:    Induction: Intravenous  Airway Management Planned:   Additional Equipment:   Intra-op Plan:   Post-operative Plan:   Informed Consent: I have reviewed the patients History and Physical, chart, labs and discussed the procedure including the risks, benefits and alternatives for the proposed anesthesia with the patient or authorized representative who has indicated his/her understanding and acceptance.     Plan Discussed with: CRNA, Anesthesiologist and Surgeon  Anesthesia Plan Comments:         Anesthesia Quick Evaluation

## 2015-05-05 ENCOUNTER — Encounter (HOSPITAL_COMMUNITY): Payer: Self-pay | Admitting: Internal Medicine

## 2015-05-06 ENCOUNTER — Encounter: Payer: Self-pay | Admitting: Internal Medicine

## 2015-05-07 ENCOUNTER — Other Ambulatory Visit: Payer: Self-pay

## 2015-05-07 MED ORDER — PANTOPRAZOLE SODIUM 40 MG PO TBEC: 40.0000 mg | DELAYED_RELEASE_TABLET | Freq: Every day | ORAL | Status: DC

## 2015-05-07 NOTE — Telephone Encounter (Signed)
Please let pt know that lower dose Linzess with be available in a few "months", not weeks.  If I said weeks, I am sorry.  When available, this dose can be Rx'ed. She can try pantoprazole 40 mg daily given the gastropathy and upper abd soreness if she would like If so, let me know in 3-4 weeks if this is helping Thanks JMP

## 2015-05-17 ENCOUNTER — Telehealth: Payer: Self-pay | Admitting: Internal Medicine

## 2015-05-17 NOTE — Telephone Encounter (Signed)
Pt notified by VM that we don't carry egg free flu vaccine, pt can call local pharmacy or the health dept

## 2015-05-17 NOTE — Telephone Encounter (Signed)
The patient wants to know if there is a flu vaccine she can take without the egg. You may leave a message on her cell phone.

## 2015-05-17 NOTE — Telephone Encounter (Signed)
[email or web address]  

## 2015-05-20 ENCOUNTER — Other Ambulatory Visit: Payer: Self-pay | Admitting: Internal Medicine

## 2015-05-25 ENCOUNTER — Ambulatory Visit (INDEPENDENT_AMBULATORY_CARE_PROVIDER_SITE_OTHER): Payer: Medicare Other

## 2015-05-25 DIAGNOSIS — E538 Deficiency of other specified B group vitamins: Secondary | ICD-10-CM | POA: Diagnosis not present

## 2015-05-25 MED ORDER — CYANOCOBALAMIN 1000 MCG/ML IJ SOLN
1000.0000 ug | Freq: Once | INTRAMUSCULAR | Status: AC
  Administered 2015-05-25: 1000 ug via INTRAMUSCULAR

## 2015-05-25 NOTE — Progress Notes (Signed)
Patient came in for a B12 injection in her right deltoid. Patient tolerated well.

## 2015-06-07 ENCOUNTER — Telehealth: Payer: Self-pay | Admitting: Internal Medicine

## 2015-06-07 DIAGNOSIS — R197 Diarrhea, unspecified: Secondary | ICD-10-CM

## 2015-06-07 NOTE — Telephone Encounter (Signed)
Pt states that the protonix has helped her a lot but reports that she had been out of town and now she is home and her belly hurts. Reports she is having pain in her lower abdomen more on the right side than the left. Describes this as an "inflammation" type pain, states it "feels as if something died inside." Reports she is having lots of terrible gas. Pt states she had some left over amoxicillin from a dental procedure that she took for the past 2 days and it does seem to have helped some. Pt states the pain is better now as long as does not eat anything. Please advise.

## 2015-06-08 ENCOUNTER — Other Ambulatory Visit: Payer: Self-pay | Admitting: Physical Medicine and Rehabilitation

## 2015-06-08 DIAGNOSIS — M5412 Radiculopathy, cervical region: Secondary | ICD-10-CM

## 2015-06-08 NOTE — Telephone Encounter (Signed)
Left message for pt to call back  °

## 2015-06-08 NOTE — Telephone Encounter (Signed)
Query where this is related to an infectious process, diverticulitis, IBS flare, or even colitis? Is she having diarrhea?  If so, I recommend stool studies (GI path panel) If not, I am ok with abx x 7 days and office followup for continuity Call if worsening or not improving Augmentin 875 mg BID x 7 days (allergy to quinolones noted)

## 2015-06-08 NOTE — Telephone Encounter (Signed)
Pt states she has had some diarrhea. Pt will come in for GI pathogen panel.

## 2015-06-09 ENCOUNTER — Telehealth: Payer: Self-pay | Admitting: Internal Medicine

## 2015-06-09 NOTE — Telephone Encounter (Signed)
Spoke with pt and let her know someone else can come to the lab and pick up the kit for her. Pt verbalized understanding.

## 2015-06-28 ENCOUNTER — Ambulatory Visit
Admission: RE | Admit: 2015-06-28 | Discharge: 2015-06-28 | Disposition: A | Discharge status: Home / Self Care | Payer: Medicare Other | Source: Ambulatory Visit | Attending: Physical Medicine and Rehabilitation | Admitting: Physical Medicine and Rehabilitation

## 2015-06-28 DIAGNOSIS — M50221 Other cervical disc displacement at C4-C5 level: Secondary | ICD-10-CM | POA: Insufficient documentation

## 2015-06-28 DIAGNOSIS — M4802 Spinal stenosis, cervical region: Secondary | ICD-10-CM | POA: Insufficient documentation

## 2015-06-28 DIAGNOSIS — M2578 Osteophyte, vertebrae: Secondary | ICD-10-CM | POA: Diagnosis not present

## 2015-06-28 DIAGNOSIS — M5412 Radiculopathy, cervical region: Secondary | ICD-10-CM | POA: Insufficient documentation

## 2015-06-28 DIAGNOSIS — M47812 Spondylosis without myelopathy or radiculopathy, cervical region: Secondary | ICD-10-CM | POA: Diagnosis not present

## 2015-06-29 ENCOUNTER — Ambulatory Visit (INDEPENDENT_AMBULATORY_CARE_PROVIDER_SITE_OTHER): Payer: Medicare Other

## 2015-06-29 DIAGNOSIS — E538 Deficiency of other specified B group vitamins: Secondary | ICD-10-CM | POA: Diagnosis not present

## 2015-06-29 MED ORDER — CYANOCOBALAMIN 1000 MCG/ML IJ SOLN
1000.0000 ug | Freq: Once | INTRAMUSCULAR | Status: AC
  Administered 2015-06-29: 1000 ug via INTRAMUSCULAR

## 2015-06-29 NOTE — Progress Notes (Signed)
Patient came into office for B12 injection in left Deltoid. Patient tolerated injection well.

## 2015-06-30 ENCOUNTER — Ambulatory Visit (INDEPENDENT_AMBULATORY_CARE_PROVIDER_SITE_OTHER): Payer: Medicare Other | Admitting: Internal Medicine

## 2015-06-30 ENCOUNTER — Encounter: Payer: Self-pay | Admitting: Internal Medicine

## 2015-06-30 VITALS — BP 108/60 | HR 60 | Ht 63.0 in | Wt 106.8 lb

## 2015-06-30 DIAGNOSIS — K224 Dyskinesia of esophagus: Secondary | ICD-10-CM

## 2015-06-30 DIAGNOSIS — K6389 Other specified diseases of intestine: Secondary | ICD-10-CM

## 2015-06-30 DIAGNOSIS — K219 Gastro-esophageal reflux disease without esophagitis: Secondary | ICD-10-CM | POA: Diagnosis not present

## 2015-06-30 DIAGNOSIS — K5900 Constipation, unspecified: Secondary | ICD-10-CM | POA: Diagnosis not present

## 2015-06-30 MED ORDER — LINACLOTIDE 72 MCG PO CAPS: 1.0000 | ORAL_CAPSULE | Freq: Every day | ORAL | Status: DC

## 2015-06-30 MED ORDER — AMOXICILLIN-POT CLAVULANATE 250-62.5 MG/5ML PO SUSR
500.0000 mg | Freq: Three times a day (TID) | ORAL | Status: DC
Start: 2015-06-30 — End: 2015-08-02

## 2015-06-30 NOTE — Patient Instructions (Addendum)
We have sent the following medications to your pharmacy for you to pick up at your convenience: Linzess 72.5 mcg daily Augmentin 500 mg three times daily x 10 days Continue Pantoprazole.  Please follow up with Dr Hilarie Fredrickson in 6 months.  If you are age 66 or older, your body mass index should be between 23-30. Your Body mass index is 18.92 kg/(m^2). If this is out of the aforementioned range listed, please consider follow up with your Primary Care Provider.  If you are age 66 or younger, your body mass index should be between 19-25. Your Body mass index is 18.92 kg/(m^2). If this is out of the aformentioned range listed, please consider follow up with your Primary Care Provider.

## 2015-06-30 NOTE — Progress Notes (Signed)
Subjective:    Patient ID: [name], female    DOB: 1950/12/16, 65 y.o.   MRN: [id number]  HPI  Anne [name] is a 65 year old female with a past medical history of esophageal dysphagia, hypertensive LES status post Botox injection, chronic constipation , pulmonary fibrosis , vocal cord dysfunction, permanent tracheostomy , history of TIA and migraines who is here for follow-up. Last seen in the office in October 2016 but for upper endoscopy with Botox to the LES on 05/04/2015.    At endoscopy the LES was injected with Botox and gastritis was seen in the gastric antrum. This was biopsied and reactive in nature without H. Pylori , dysplasia or metaplasia. Pantoprazole was started after this procedure for upper abdominal burning type pain. She reports this is helped tremendously. She also found Botox injection to again be very helpful and definitely "worth it". She still has to be careful with eating and can be limited with some meats and breads but swallowing his been easier. One of her biggest issues of late his constipation and achy lower abdominal pain. She also is having issues with lower abdominal bloating. Stools have been hard and small described as "little peanuts". She try probiotic without help. She is using an over-the-counter laxative to help induce bowel movement. Interestingly she was placed on amoxicillin for bronchitis which actually helped with bloating and her lower abdominal achy pain. She has not had fever or chills. She tried Linzess 145 g but this caused diarrhea.    her last colonoscopy was performed in [facility] on 10/30/2008 --  This was normal.  Review of Systems  As per history of present illness, otherwise negative  Current Medications, Allergies, Past Medical History, Past Surgical History, Family History and Social History were reviewed in [place] record.     Objective:   Physical Exam BP 108/60 mmHg  Pulse 60  Ht 5\' 3"   (1.6 m)  Wt 106 lb 12.8 oz (48.444 kg)  BMI 18.92 kg/m2 Constitutional: Well-developed and well-nourished. No distress. HEENT: Normocephalic and atraumatic. Conjunctivae are normal.  No scleral icterus. Neck: Neck supple.  Tracheostomy in place Cardiovascular: Normal rate, regular rhythm and intact distal pulses. No M/R/G Pulmonary/chest: Effort normal and breath sounds normal. No wheezing, rales or rhonchi. Abdominal: Soft,  Lower abdominal tenderness without rebound or guarding, nondistended. Bowel sounds active throughout. There are no masses palpable. No hepatosplenomegaly. Extremities: no clubbing, cyanosis, or edema Neurological: Alert and oriented to person place and time. Skin: Skin is warm and dry.  Psychiatric: Normal mood and affect. Behavior is normal.      Assessment & Plan:   65 year old female with a past medical history of esophageal dysphagia, hypertensive LES status post Botox injection, chronic constipation , pulmonary fibrosis , vocal cord dysfunction, permanent tracheostomy , history of TIA and migraines who is here for follow-up.  1.  Esophageal dysmotility/hypertensive LES --  Improved with Botox injection. Continue soft diet. Reflux precautions.   2. Gastritis with epigastric pain --  Considerable improvement with pantoprazole. We will continue 40 mg daily. No H. Pylori infection found at biopsy   3. Constipation --  Chronic and Linzess 145 g was causing diarrhea. Now lower dose Linzess is available and will prescribe 72.5 g daily. Call with constipation.  4.  Bloating and lower abdominal pain --  Likely related to constipation and irritable bowel. Considerable improvement with antibiotics also argues for bacterial overgrowth syndrome. Given improvement on going to treat with Augmentin  500 mg 3 times a day liquid 10 days. Previous trial of probiotic was not helpful.   5. Colorectal cancer screening --  Normal colonoscopy 2010,  Repeat screening recommended  2020   6 month follow-up, sooner if necessary 25 minutes spent with the patient today. Greater than 50% was spent in counseling and coordination of care with the patient

## 2015-07-01 ENCOUNTER — Telehealth: Payer: Self-pay | Admitting: *Deleted

## 2015-07-01 NOTE — Telephone Encounter (Signed)
[employer] has approved patient's Linzess from 04/02/15-06/30/16.

## 2015-07-02 ENCOUNTER — Telehealth: Payer: Self-pay | Admitting: Internal Medicine

## 2015-07-02 NOTE — Telephone Encounter (Signed)
Pt states even after the lower dose of linzess was approved the copay was still to high for her. States she will continue to try taking the 112mcg dose and splitting the capsule in half.

## 2015-07-07 ENCOUNTER — Other Ambulatory Visit: Payer: Self-pay | Admitting: *Deleted

## 2015-07-07 DIAGNOSIS — R200 Anesthesia of skin: Secondary | ICD-10-CM

## 2015-07-15 ENCOUNTER — Other Ambulatory Visit: Payer: Self-pay | Admitting: Internal Medicine

## 2015-07-16 NOTE — Telephone Encounter (Signed)
Last refilled on 02.24.2017. Please advise?

## 2015-07-17 ENCOUNTER — Encounter: Payer: Self-pay | Admitting: Internal Medicine

## 2015-07-20 ENCOUNTER — Other Ambulatory Visit: Payer: Self-pay | Admitting: Internal Medicine

## 2015-07-27 ENCOUNTER — Ambulatory Visit (INDEPENDENT_AMBULATORY_CARE_PROVIDER_SITE_OTHER): Payer: Medicare Other | Admitting: Neurology

## 2015-07-27 DIAGNOSIS — M5412 Radiculopathy, cervical region: Secondary | ICD-10-CM

## 2015-07-27 DIAGNOSIS — R200 Anesthesia of skin: Secondary | ICD-10-CM

## 2015-07-27 NOTE — Procedures (Signed)
[facility]  [address] [place]  [place], [place] [address] Tel: [phone] Fax:  [phone] Test Date:  07/27/2015  Patient: Anne [name] DOB: [birth year] Physician: Narda Amber, DO  Sex: Female Height: 5\' 3"  Ref Phys: Dr Sharlet Salina  ID#: [id number] Temp: 33.8C Technician: Jerilynn Mages. Dean   Patient Complaints: This is a 65 year old female referred for evaluation of neck pain and bilateral hand and arm numbness.  NCV & EMG Findings: Extensive electrodiagnostic testing of the right upper extremity and additional studies of the left shows: 1. Bilateral median, ulnar, and palmar sensory responses are normal. 2. Bilateral median and ulnar motor responses are within normal limits. 3. Chronic motor axon loss changes are seen affecting C5-C7 myotomes bilaterally, without accompanied active denervation.  Impression: 1. 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 2. There is no evidence of carpal tunnel syndrome affecting the upper extremities.   ___________________________ Narda Amber, DO    Nerve Conduction Studies Anti Sensory Summary Table   Site NR Peak (ms) Norm Peak (ms) P-T Amp (V) Norm P-T Amp  Left Median Anti Sensory (2nd Digit)  33.8C  Wrist    3.3 <3.8 18.6 >10  Right Median Anti Sensory (2nd Digit)  33.8C  Wrist    3.4 <3.8 22.4 >10  Left Ulnar Anti Sensory (5th Digit)  33.8C  Wrist    2.8 <3.2 21.7 >5  Right Ulnar Anti Sensory (5th Digit)  33.8C  Wrist    3.0 <3.2 17.8 >5   Motor Summary Table   Site NR Onset (ms) Norm Onset (ms) O-P Amp (mV) Norm O-P Amp Site1 Site2 Delta-0 (ms) Dist (cm) Vel (m/s) Norm Vel (m/s)  Left Median Motor (Abd Poll Brev)  33.8C  Wrist    3.3 <4.0 9.1 >5 Elbow Wrist 4.2 22.0 52 >50  Elbow    7.5  8.7         Right Median Motor (Abd Poll Brev)  33.8C  Wrist    3.1 <4.0 8.3 >5 Elbow Wrist 4.9 26.0 53 >50  Elbow    8.0  8.1         Left Ulnar Motor (Abd Dig  Minimi)  33.8C  Wrist    2.9 <3.1 9.0 >7 B Elbow Wrist 3.2 18.0 56 >50  B Elbow    6.1  8.3  A Elbow B Elbow 1.8 10.0 56 >50  A Elbow    7.9  7.5         Right Ulnar Motor (Abd Dig Minimi)  33.8C  Wrist    2.7 <3.1 10.1 >7 B Elbow Wrist 3.5 19.0 54 >50  B Elbow    6.2  9.7  A Elbow B Elbow 1.7 10.0 59 >50  A Elbow    7.9  9.0          Comparison Summary Table   Site NR Peak (ms) Norm Peak (ms) P-T Amp (V) Site1 Site2 Delta-P (ms) Norm Delta (ms)  Left Median/Ulnar Palm Comparison (Wrist - 8cm)  33.8C  Median Palm    1.9 <2.2 65.2 Median Palm Ulnar Palm 0.0   Ulnar Palm    1.9 <2.2 33.0      Right Median/Ulnar Palm Comparison (Wrist - 8cm)  33.8C  Median Palm    1.9 <2.2 50.1 Median Palm Ulnar Palm 0.1   Ulnar Palm    2.0 <2.2 23.0       EMG   Side Muscle Ins Act Fibs Psw Fasc  Number Recrt Dur Dur. Amp Amp. Poly Poly. Comment  Right 1stDorInt Nml Nml Nml Nml Nml Nml Nml Nml Nml Nml Nml Nml N/A  Right Ext Indicis Nml Nml Nml Nml Nml Nml Nml Nml Nml Nml Nml Nml N/A  Right PronatorTeres Nml Nml Nml Nml 1- Rapid Many 1+ Some 1+ Nml Nml N/A  Right Biceps Nml Nml Nml Nml 1- Rapid Some 1+ Some 1+ Nml Nml N/A  Right Triceps Nml Nml Nml Nml 1- Rapid Some 1+ Some 1+ Nml Nml N/A  Right Deltoid Nml Nml Nml Nml 1- Rapid Some 1+ Few 1+ Nml Nml N/A  Right Infraspinatus Nml Nml Nml Nml 1- Rapid Some 1+ Few 1+ Nml Nml N/A  Left 1stDorInt Nml Nml Nml Nml Nml Nml Nml Nml Nml Nml Nml Nml N/A  Left Ext Indicis Nml Nml Nml Nml Nml Nml Nml Nml Nml Nml Nml Nml N/A  Left PronatorTeres Nml Nml Nml Nml 1- Rapid Some 1+ Some 1+ Nml Nml N/A  Left Biceps Nml Nml Nml Nml 1- Rapid Some 1+ Some 1+ Nml Nml N/A  Left Triceps Nml Nml Nml Nml 1- Rapid Some 1+ Some 1+ Nml Nml N/A  Left Deltoid Nml Nml Nml Nml 1- Mod-R Few 1+ Few 1+ Nml Nml N/A  Left Infraspinatus Nml Nml Nml Nml 1- Rapid Some 1+ Few 1+ Nml Nml N/A      Waveforms:

## 2015-07-29 ENCOUNTER — Telehealth: Payer: Self-pay | Admitting: Internal Medicine

## 2015-07-29 NOTE — Telephone Encounter (Signed)
Pt states she was given an antibiotic the last time she saw Dr. Hilarie Fredrickson and it seemed to help. Pt wants to know if she can have a refill on this. Please advise.

## 2015-08-02 MED ORDER — LUBIPROSTONE 8 MCG PO CAPS: 8.0000 ug | ORAL_CAPSULE | Freq: Two times a day (BID) | ORAL | Status: DC

## 2015-08-02 MED ORDER — AMOXICILLIN-POT CLAVULANATE 250-62.5 MG/5ML PO SUSR: 500.0000 mg | Freq: Three times a day (TID) | ORAL | Status: DC

## 2015-08-02 NOTE — Telephone Encounter (Signed)
Spoke with pt and she is aware. States her insurance would not cover the lower dose of linzess so she has been trying to take half the dose of the 152mcg. She reports she is still having loose stools with this. Refill on antibiotic and script for amitiza sent to pharmacy.

## 2015-08-02 NOTE — Telephone Encounter (Signed)
Left message for pt to call back  °

## 2015-08-02 NOTE — Telephone Encounter (Signed)
If the lowest dose of linzess is causing diarrhea, I would rec trying Amitiza 8 mcg BID Okay for refill of abx for SIBO/IBS (Augmentin liquid x 7 days)

## 2015-08-03 ENCOUNTER — Ambulatory Visit (INDEPENDENT_AMBULATORY_CARE_PROVIDER_SITE_OTHER): Payer: Medicare Other

## 2015-08-03 DIAGNOSIS — E538 Deficiency of other specified B group vitamins: Secondary | ICD-10-CM | POA: Diagnosis not present

## 2015-08-03 MED ORDER — CYANOCOBALAMIN 1000 MCG/ML IJ SOLN
1000.0000 ug | Freq: Once | INTRAMUSCULAR | Status: AC
  Administered 2015-08-03: 1000 ug via INTRAMUSCULAR

## 2015-08-03 NOTE — Progress Notes (Signed)
Patient came in for a B12 injection.  Received in her Right deltoid.  Patient tolerated well.

## 2015-08-07 ENCOUNTER — Other Ambulatory Visit: Payer: Self-pay | Admitting: Internal Medicine

## 2015-08-11 ENCOUNTER — Ambulatory Visit (INDEPENDENT_AMBULATORY_CARE_PROVIDER_SITE_OTHER): Payer: Medicare Other | Admitting: Internal Medicine

## 2015-08-11 ENCOUNTER — Telehealth: Payer: Self-pay | Admitting: Internal Medicine

## 2015-08-11 ENCOUNTER — Encounter: Payer: Self-pay | Admitting: Internal Medicine

## 2015-08-11 VITALS — BP 124/60 | HR 68 | Ht 63.0 in | Wt 108.0 lb

## 2015-08-11 DIAGNOSIS — Z930 Tracheostomy status: Secondary | ICD-10-CM

## 2015-08-11 DIAGNOSIS — J8410 Pulmonary fibrosis, unspecified: Secondary | ICD-10-CM | POA: Diagnosis not present

## 2015-08-11 DIAGNOSIS — J383 Other diseases of vocal cords: Secondary | ICD-10-CM | POA: Diagnosis not present

## 2015-08-11 MED ORDER — METRONIDAZOLE 50 MG/ML ORAL SUSPENSION: 250.0000 mg | Freq: Three times a day (TID) | ORAL | Status: DC

## 2015-08-11 NOTE — Assessment & Plan Note (Signed)
Per Dr. Lake Bells - the recommendation is that this never be removed. It has literally saved her hospitalizations and possibly intubations because of her vocal cord dysfunction, COPD and asthma. In fact Dr. Lake Bells do not believe that she has COPD at all. She breathes fine even when sick when the tracheostomy has been uncapped.

## 2015-08-11 NOTE — Telephone Encounter (Signed)
Pt aware, states her insurance will not cover the lower dose yet, she states she splits the 145 dose. Flagyl sent to pharmacy.

## 2015-08-11 NOTE — Telephone Encounter (Signed)
Now that she has failed Amitiza, please re-Rx Linzess 72.5 mcg daily -- this med was previously denied by her insurance D/c Augmentin Can try metronidazole 250 mg TID x 10 days for lower abd bloating

## 2015-08-11 NOTE — Patient Instructions (Signed)
Follow up with Dr. Stevenson Clinch in: 6 months - continue with usual trach care - avoid allergens - avoid sick contacts

## 2015-08-11 NOTE — Assessment & Plan Note (Signed)
Severe vocal cord dysfunction that contributes to her symptoms.  Per Dr. 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  She has some chronic scarring in the superior portions of her lungs which was evaluated with a bronchoscopy and biopsy. Fortunately this was negative. If there is evidence of progression of that, then it would need to be evaluated with an open lung biopsy. However at this time I feel that all of her symptoms come from vocal cord problems and episodes of aspiration. Primarily, I feel that this is vocal cord dysfunction given the intermittent nature.  Plan: - I recommended that the tracheostomy be permanent - cont with usual trach care.

## 2015-08-11 NOTE — Assessment & Plan Note (Signed)
" >>  ASSESSMENT AND PLAN FOR PULMONARY FIBROSIS ([facility]) WRITTEN ON 08/11/2015 10:30 AM BY Jeweldean Drohan, MD  She has mild scarring in the apices of her lungs which is due to chronic cough. This is been evaluated extensively with a bronchoscopy with biopsy and workup. There is no evidence of exacerbation based on her last chest x-ray which I have reviewed.     "

## 2015-08-11 NOTE — Progress Notes (Signed)
Anne [name]      MRN# [id number] Anne [name] 06/06/1950   CC: Chief Complaint  Patient presents with  . Follow-up    pt states since last OV she was seen @ PCP dx w/URI around 03/2015 started amoxicillin & predisone breathing improved. pt c/o cont occ sob. denies wheezing, cough or cp/tightness.      Brief History: Synopsis: She enjoys establish care with the [facility] pulmonary clinic in may of 2014. 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 A CT scan in 2013 performed at [facility] showed some groundglass changes, interstitial thickening, and interlobular thickening in the upper lobes predominantly. This had progressed since 2011. 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 She was also evaluated by [facility] in 2014 and was found to have significant esophageal dysmotility and increased LES tone. She has had a J tube on two separate occasions in the past.  On 07/29/2013 she had lower esophageal Botox injections.   Events since last clinic visit: Patient presents today for a follow-up visit of chronic cough along with history of chronic tracheostomy vocal cord paresis, and nonspecific fibrosis. Since her last visit she has seen her primary care in urgent care about 3 times during the winter season for upper respiratory tract infections that were treated with both antibiotics and steroids. She does have a history of  irritable bowel syndrome, he noticed that when she was on antibiotics for her URIs her symptoms of IBS were improved. She has seen her GI physician, and he has prescribed her a ten-day course of Augmentin for suspected IBS/bacterial overgrowth syndrome, for which she has completed. Today she has a mild chronic cough that is not worse. No significant Worsening of dyspnea on exertion. She is followed closely with ENT. Overall in stable health and condition.     Medication:   Current Outpatient Rx  Name  Route  Sig  Dispense  Refill  . albuterol (PROVENTIL HFA;VENTOLIN HFA) 108 (90 BASE) MCG/ACT inhaler   Inhalation   Inhale 2 puffs into the lungs every 6 (six) hours as needed for wheezing or shortness of breath.   1 Inhaler   2     Please use ventolin as this is covered on pt's for ...   . ALPRAZolam (XANAX) 0.25 MG tablet      TAKE 1 TABLET BY MOUTH TWICE A DAY AS NEEDED   60 tablet   0     Not to exceed 5 additional fills before 11/17/2015 ...   . amoxicillin-clavulanate (AUGMENTIN) 250-62.5 MG/5ML suspension   Oral   Take 10 mLs (500 mg total) by mouth 3 (three) times daily. X 10 days   300 mL   0   . cetirizine (ZYRTEC) 10 MG tablet      TAKE 1 TABLET (10 MG TOTAL) BY MOUTH DAILY.   30 tablet   11   . clopidogrel (PLAVIX) 75 MG tablet      TAKE 1 TABLET BY MOUTH IN  THE MORNING   90 tablet   1   . cyanocobalamin (,VITAMIN B-12,) 1000 MCG/ML injection   Intramuscular   Inject 1 mL (1,000 mcg total) into the muscle once.   1 mL   0   . EPINEPHrine (EPIPEN 2-PAK) 0.3 mg/0.3 mL IJ SOAJ injection   Intramuscular   Inject 0.3 mLs (0.3 mg total) into the muscle once. Patient taking differently: Inject 0.3 mg into the muscle once as needed (anaphylaxis).    1 Device   0   . estradiol (ESTRACE) 1 MG tablet   Oral   Take 1 tablet (1 mg total) by mouth daily.   90 tablet   3   . fluticasone (FLONASE) 50 MCG/ACT nasal spray   Each Nare   Place 2 sprays into  both nostrils daily as needed for allergies.          . furosemide (LASIX) 20 MG tablet      TAKE 1 TABLET BY MOUTH ONCE A DAY AS NEEDED   90 tablet   1   . HYDROcodone-acetaminophen (NORCO/VICODIN) 5-325 MG tablet   Oral   Take 0.5 tablets by mouth 2 (two) times daily as needed for severe pain. Reported on 03/15/2015         . hydroxypropyl methylcellulose (ISOPTO TEARS) 2.5 % ophthalmic solution   Both Eyes   Place 1 drop into both eyes 3 (three) times daily as needed for dry eyes.         . hyoscyamine (LEVSIN SL) 0.125 MG SL tablet   Sublingual   Place 1 tablet (0.125 mg total) under the tongue every 4 (four) hours as needed. Patient taking differently: Place 0.125 mg under the tongue every 4 (four) hours as needed for cramping.    30 tablet   2   . ibuprofen (ADVIL,MOTRIN) 200 MG tablet   Oral   Take 400-600 mg by mouth every 6 (six) hours as needed for mild pain or moderate pain.         [name] ipratropium-albuterol (DUONEB) 0.5-2.5 (3) MG/3ML SOLN   Nebulization   Take 3 mLs by nebulization every 4 (four) hours as needed. Dx 496 Patient taking differently: Take 3 mLs by nebulization every 4 (four) hours as needed (difficulty breathing).    360 mL   2   . Linaclotide (LINZESS) 72 MCG CAPS   Oral   Take 1 capsule by mouth daily.   30 capsule   4   . lubiprostone (AMITIZA) 8 MCG capsule   Oral   Take 1 capsule (8 mcg total) by mouth 2 (two) times daily with a meal.   60 capsule   3   . montelukast (SINGULAIR) 10 MG tablet      TAKE 1 TABLET BY MOUTH EVERY DAY   90 tablet   1   . mupirocin ointment (BACTROBAN) 2 %   Topical   Apply 1 application topically daily. To keep trach site from getting irritated         . pantoprazole (PROTONIX) 40 MG tablet   Oral   Take 1 tablet (40 mg total) by mouth daily.   90 tablet   3   . sodium chloride (OCEAN) 0.65 % SOLN nasal spray   Each Nare   Place 1 spray into both nostrils every 4 (four) hours as needed  for congestion.          [name] zolmitriptan (ZOMIG ZMT) 5 MG disintegrating tablet   Oral   Take  1 tablet (5 mg total) by mouth as needed for migraine.   10 tablet   0      Review of Systems  Constitutional: Negative for fever, chills, weight loss and malaise/fatigue.  Eyes: Negative for blurred vision.  Respiratory: Negative for sputum production, shortness of breath and wheezing.        Mild chronic cough   Cardiovascular: Negative for chest pain and palpitations.  Gastrointestinal: Negative for heartburn and nausea.  Genitourinary: Negative for dysuria.  Musculoskeletal: Negative for myalgias.  Skin: Negative for rash.  Endo/Heme/Allergies: Does not bruise/bleed easily.      Allergies:  Influenza vaccines; Shellfish allergy; Quinolones; Asa; Avelox; Ciprofloxacin; Levaquin; Septra; and Eggs or egg-derived products  Physical Examination:  VS: BP 124/60 mmHg  Pulse 68  Ht 5\' 3"  (1.6 m)  Wt 108 lb (48.988 kg)  BMI 19.14 kg/m2  SpO2 98%  General Appearance: No distress  HEENT: PERRLA, no ptosis, no other lesions noticed Pulmonary:normal breath sounds., diaphragmatic excursion normal.No wheezing, No rales   Cardiovascular:  Normal S1,S2.  No m/r/g.     Abdomen:Exam: Benign, Soft, non-tender, No masses  Skin:   warm, no rashes, no ecchymosis  Extremities: normal, no cyanosis, clubbing, warm with normal capillary refill.      Rad results: (The following images and results were reviewed by Dr. Stevenson Clinch on 08/11/2015).  CXR 04/2014 COMPARISON: 06/18/2013.  FINDINGS: Tracheostomy tube noted in good anatomic position. Biapical pleural parenchymal thickening again noted consistent with scarring. No infiltrate. Heart size normal. Surgical clips upper abdomen. No acute bony abnormality .  IMPRESSION: 1. Tracheostomy tube in good anatomic position. 2. Stable biapical pleural parenchymal thickening consistent with scarring. No acute cardiopulmonary disease.      Assessment and Plan: Postinflammatory pulmonary fibrosis She has mild scarring in the apices of her lungs which is due to chronic cough. This is been evaluated extensively with a bronchoscopy with biopsy and workup. There is no evidence of exacerbation based on her last chest x-ray which I have reviewed.      Tracheostomy dependent Per Dr. Lake Bells - the recommendation is that this never be removed. It has literally saved her hospitalizations and possibly intubations because of her vocal cord dysfunction, COPD and asthma. In fact Dr. Lake Bells do not believe that she has COPD at all. She breathes fine even when sick when the tracheostomy has been uncapped.      Vocal cord dysfunction Severe vocal cord dysfunction that contributes to her symptoms.  Per Dr. 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  She has some chronic scarring in the superior portions of her lungs which was evaluated with a bronchoscopy and biopsy. Fortunately this was negative. If there is evidence of progression of that, then it would need to be evaluated with an open lung biopsy. However at this time I feel that all of her symptoms come from vocal cord problems and episodes of aspiration. Primarily, I feel that this is vocal cord dysfunction given the intermittent nature.  Plan: - I recommended that the tracheostomy be permanent - cont with usual trach care.         Updated Medication List Outpatient Encounter Prescriptions as of 08/11/2015  Medication Sig  . albuterol (PROVENTIL HFA;VENTOLIN HFA) 108 (90 BASE) MCG/ACT inhaler Inhale 2 puffs into the lungs every 6 (six) hours as needed for wheezing or shortness of breath.  . ALPRAZolam (XANAX) 0.25 MG tablet TAKE  1 TABLET BY MOUTH TWICE A DAY AS NEEDED  . amoxicillin-clavulanate (AUGMENTIN) 250-62.5 MG/5ML suspension Take 10 mLs (500 mg total) by mouth 3 (three)  times daily. X 10 days  . cetirizine (ZYRTEC) 10 MG tablet TAKE 1 TABLET (10 MG TOTAL) BY MOUTH DAILY.  [name] clopidogrel (PLAVIX) 75 MG tablet TAKE 1 TABLET BY MOUTH IN THE MORNING  . cyanocobalamin (,VITAMIN B-12,) 1000 MCG/ML injection Inject 1 mL (1,000 mcg total) into the muscle once.  [name] EPINEPHrine (EPIPEN 2-PAK) 0.3 mg/0.3 mL IJ SOAJ injection Inject 0.3 mLs (0.3 mg total) into the muscle once. (Patient taking differently: Inject 0.3 mg into the muscle once as needed (anaphylaxis). )  . estradiol (ESTRACE) 1 MG tablet Take 1 tablet (1 mg total) by mouth daily.  . fluticasone (FLONASE) 50 MCG/ACT nasal spray Place 2 sprays into both nostrils daily as needed for allergies.   . furosemide (LASIX) 20 MG tablet TAKE 1 TABLET BY MOUTH ONCE A DAY AS NEEDED  . HYDROcodone-acetaminophen (NORCO/VICODIN) 5-325 MG tablet Take 0.5 tablets by mouth 2 (two) times daily as needed for severe pain. Reported on 03/15/2015  . hydroxypropyl methylcellulose (ISOPTO TEARS) 2.5 % ophthalmic solution Place 1 drop into both eyes 3 (three) times daily as needed for dry eyes.  . hyoscyamine (LEVSIN SL) 0.125 MG SL tablet Place 1 tablet (0.125 mg total) under the tongue every 4 (four) hours as needed. (Patient taking differently: Place 0.125 mg under the tongue every 4 (four) hours as needed for cramping. )  . ibuprofen (ADVIL,MOTRIN) 200 MG tablet Take 400-600 mg by mouth every 6 (six) hours as needed for mild pain or moderate pain.  [name] ipratropium-albuterol (DUONEB) 0.5-2.5 (3) MG/3ML SOLN Take 3 mLs by nebulization every 4 (four) hours as needed. Dx 496 (Patient taking differently: Take 3 mLs by nebulization every 4 (four) hours as needed (difficulty breathing). )  . Linaclotide (LINZESS) 72 MCG CAPS Take 1 capsule by mouth daily.  [name] lubiprostone (AMITIZA) 8 MCG capsule Take 1 capsule (8 mcg total) by mouth 2 (two) times daily with a meal.  . montelukast (SINGULAIR) 10 MG tablet TAKE 1 TABLET BY MOUTH EVERY DAY  . mupirocin  ointment (BACTROBAN) 2 % Apply 1 application topically daily. To keep trach site from getting irritated  . pantoprazole (PROTONIX) 40 MG tablet Take 1 tablet (40 mg total) by mouth daily.  . sodium chloride (OCEAN) 0.65 % SOLN nasal spray Place 1 spray into both nostrils every 4 (four) hours as needed for congestion.   [name] zolmitriptan (ZOMIG ZMT) 5 MG disintegrating tablet Take 1 tablet (5 mg total) by mouth as needed for migraine.  . [DISCONTINUED] amoxicillin (AMOXIL) 250 MG/5ML suspension Take 10 mLs (500 mg total) by mouth 2 (two) times daily. (Patient not taking: Reported on 08/11/2015)  . [DISCONTINUED] LINZESS 145 MCG CAPS capsule   . [DISCONTINUED] predniSONE (DELTASONE) 10 MG tablet Take 4 tablets (40 mg total) by mouth daily with breakfast. (Patient not taking: Reported on 08/11/2015)   No facility-administered encounter medications on file as of 08/11/2015.    Orders for this visit: No orders of the defined types were placed in this encounter.    Thank  you for the visitation and for allowing  [facility] to assist in the care of your patient. Our recommendations are noted above.  Please contact us if we can be of further service.  Vilinda Boehringer, MD [facility] Office Number: [phone]  Note:  This note was prepared with Dragon dictation along with smaller phrase technology. Any transcriptional errors that result from this process are unintentional.

## 2015-08-11 NOTE — Assessment & Plan Note (Signed)
She has mild scarring in the apices of her lungs which is due to chronic cough. This is been evaluated extensively with a bronchoscopy with biopsy and workup. There is no evidence of exacerbation based on her last chest x-ray which I have reviewed.

## 2015-08-11 NOTE — Telephone Encounter (Signed)
Pt wanted to let Dr. Hilarie Fredrickson know that the amitiza did nothing for her and she has resumed the linzess. Pt reports that the augmentin she has one more day left and it does not seem to have worked this time. States she is still having a lot of bloating and gas. Pt wants to know if she may need a different antibiotic. Please advise.

## 2015-08-12 ENCOUNTER — Telehealth: Payer: Self-pay | Admitting: Internal Medicine

## 2015-08-12 MED ORDER — METRONIDAZOLE 250 MG PO TABS: 250.0000 mg | ORAL_TABLET | Freq: Three times a day (TID) | ORAL | Status: DC

## 2015-08-12 NOTE — Telephone Encounter (Signed)
Script sent to pharmacy, pt aware. 

## 2015-08-24 ENCOUNTER — Encounter: Payer: Self-pay | Admitting: Family

## 2015-08-24 ENCOUNTER — Ambulatory Visit (INDEPENDENT_AMBULATORY_CARE_PROVIDER_SITE_OTHER): Payer: Medicare Other | Admitting: Family

## 2015-08-24 VITALS — BP 98/60 | HR 75 | Temp 97.7°F | Ht 63.0 in | Wt 106.0 lb

## 2015-08-24 DIAGNOSIS — N390 Urinary tract infection, site not specified: Secondary | ICD-10-CM | POA: Diagnosis not present

## 2015-08-24 LAB — POC URINALSYSI DIPSTICK (AUTOMATED)
Glucose, UA: 100
Nitrite, UA: POSITIVE
Protein, UA: >=300
Spec Grav, UA: 1.025
Urobilinogen, UA: 1
pH, UA: 6

## 2015-08-24 MED ORDER — NITROFURANTOIN MONOHYD MACRO 100 MG PO CAPS
100.0000 mg | ORAL_CAPSULE | Freq: Two times a day (BID) | ORAL | Status: DC
Start: 2015-08-24 — End: 2015-10-28

## 2015-08-24 NOTE — Progress Notes (Signed)
Subjective:    Patient ID: Anne [name], female    DOB: January 04, 1951, 65 y.o.   MRN: [id number]   Anne [name] is a 65 y.o. female who presents today for an acute visit.    Urinary Tract Infection  This is a new problem. The current episode started in the past 7 days. The problem occurs intermittently. The problem has been unchanged. The quality of the pain is described as burning. The pain is mild. There has been no fever. She is not sexually active. There is no history of pyelonephritis. Associated symptoms include chills, flank pain and hematuria. Pertinent negatives include no nausea or vomiting. Treatments tried: urostat, cranberry juice. The treatment provided no relief. There is no history of kidney stones, recurrent UTIs or a urological procedure.   Past Medical History  Diagnosis Date  . Asthma   . COPD (chronic obstructive pulmonary disease) ([place])   . Stroke [facility])     slurred speech, drawn face, imaging normal, occurred twice, [facility]  . Migraine   . Allergic rhinitis   . Anemia   . IBS (irritable bowel syndrome)   . Shingles   . Tracheostomy in place [facility])   . Pulmonary fibrosis ([place])   . Vocal cord paresis   . Heart murmur     as child  . CVA (cerebral infarction)   . Esophageal dysmotility   . Complication of anesthesia     Breathing problems. 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  . Problems with swallowing     intermittently   Allergies: Influenza vaccines; Shellfish allergy; Quinolones; Asa; Avelox; Ciprofloxacin; Levaquin; Septra; and Eggs or egg-derived products Current Outpatient Prescriptions on File Prior to Visit  Medication Sig Dispense Refill  . albuterol (PROVENTIL HFA;VENTOLIN HFA) 108 (90 BASE) MCG/ACT inhaler Inhale 2 puffs into the lungs every 6 (six) hours as needed for wheezing or shortness of breath. 1 Inhaler 2  . ALPRAZolam (XANAX) 0.25 MG tablet TAKE 1 TABLET BY MOUTH TWICE A DAY AS NEEDED 60 tablet 0  . amoxicillin-clavulanate (AUGMENTIN)  250-62.5 MG/5ML suspension Take 10 mLs (500 mg total) by mouth 3 (three) times daily. X 10 days 300 mL 0  . cetirizine (ZYRTEC) 10 MG tablet TAKE 1 TABLET (10 MG TOTAL) BY MOUTH DAILY. 30 tablet 11  . clopidogrel (PLAVIX) 75 MG tablet TAKE 1 TABLET BY MOUTH IN THE MORNING 90 tablet 1  . cyanocobalamin (,VITAMIN B-12,) 1000 MCG/ML injection Inject 1 mL (1,000 mcg total) into the muscle once. 1 mL 0  . EPINEPHrine (EPIPEN 2-PAK) 0.3 mg/0.3 mL IJ SOAJ injection Inject 0.3 mLs (0.3 mg total) into the muscle once. (Patient taking differently: Inject 0.3 mg into the muscle once as needed (anaphylaxis). ) 1 Device 0  . estradiol (ESTRACE) 1 MG tablet Take 1 tablet (1 mg total) by mouth daily. 90 tablet 3  . fluticasone (FLONASE) 50 MCG/ACT nasal spray Place 2 sprays into both nostrils daily as needed for allergies.     . furosemide (LASIX) 20 MG tablet TAKE 1 TABLET BY MOUTH ONCE A DAY AS NEEDED 90 tablet 1  . HYDROcodone-acetaminophen (NORCO/VICODIN) 5-325 MG tablet Take 0.5 tablets by mouth 2 (two) times daily as needed for severe pain. Reported on 03/15/2015    . hydroxypropyl methylcellulose (ISOPTO TEARS) 2.5 % ophthalmic solution Place 1 drop into both eyes 3 (three) times daily as needed for dry eyes.    . hyoscyamine (LEVSIN SL) 0.125 MG SL tablet Place 1 tablet (0.125 mg total) under the tongue  every 4 (four) hours as needed. (Patient taking differently: Place 0.125 mg under the tongue every 4 (four) hours as needed for cramping. ) 30 tablet 2  . ibuprofen (ADVIL,MOTRIN) 200 MG tablet Take 400-600 mg by mouth every 6 (six) hours as needed for mild pain or moderate pain.    [name] ipratropium-albuterol (DUONEB) 0.5-2.5 (3) MG/3ML SOLN Take 3 mLs by nebulization every 4 (four) hours as needed. Dx 496 (Patient taking differently: Take 3 mLs by nebulization every 4 (four) hours as needed (difficulty breathing). ) 360 mL 2  . Linaclotide (LINZESS) 72 MCG CAPS Take 1 capsule by mouth daily. 30 capsule 4  .  lubiprostone (AMITIZA) 8 MCG capsule Take 1 capsule (8 mcg total) by mouth 2 (two) times daily with a meal. 60 capsule 3  . metroNIDAZOLE (FLAGYL) 250 MG tablet Take 1 tablet (250 mg total) by mouth 3 (three) times daily. 30 tablet 0  . montelukast (SINGULAIR) 10 MG tablet TAKE 1 TABLET BY MOUTH EVERY DAY 90 tablet 1  . mupirocin ointment (BACTROBAN) 2 % Apply 1 application topically daily. To keep trach site from getting irritated    . pantoprazole (PROTONIX) 40 MG tablet Take 1 tablet (40 mg total) by mouth daily. 90 tablet 3  . sodium chloride (OCEAN) 0.65 % SOLN nasal spray Place 1 spray into both nostrils every 4 (four) hours as needed for congestion.     [name] zolmitriptan (ZOMIG ZMT) 5 MG disintegrating tablet Take 1 tablet (5 mg total) by mouth as needed for migraine. 10 tablet 0   No current facility-administered medications on file prior to visit.    Social History  Substance Use Topics  . Smoking status: Never Smoker   . Smokeless tobacco: Never Used  . Alcohol Use: No    Review of Systems  Constitutional: Positive for chills. Negative for fever.  Cardiovascular: Negative for chest pain and palpitations.  Gastrointestinal: Negative for nausea, vomiting and abdominal distention.  Genitourinary: Positive for dysuria, hematuria and flank pain.      Objective:    BP 98/60 mmHg  Pulse 75  Temp(Src) 97.7 F (36.5 C)  Ht 5\' 3"  (1.6 m)  Wt 106 lb (48.081 kg)  BMI 18.78 kg/m2  SpO2 98%   Physical Exam  Constitutional: She appears well-developed and well-nourished.  Cardiovascular: Normal rate, regular rhythm, normal heart sounds and normal pulses.   Pulmonary/Chest: Effort normal and breath sounds normal. She has no wheezes. She has no rhonchi. She has no rales.  Abdominal: There is no CVA tenderness.  Neurological: She is alert.  Skin: Skin is warm and dry.  Psychiatric: She has a normal mood and affect. Her speech is normal and behavior is normal. Thought content normal.    Vitals reviewed.      Assessment & Plan:   1. UTI (lower urinary tract infection) Urinalysis is positive for leukocytes, blood, nitrites. Will treat empirically. Pending culture.  - POCT Urinalysis Dipstick (Automated) - Urine culture - nitrofurantoin, macrocrystal-monohydrate, (MACROBID) 100 MG capsule; Take 1 capsule (100 mg total) by mouth 2 (two) times daily. Take with food.  Dispense: 10 capsule; Refill: 0    I am having Ms. Umana maintain her ibuprofen, hydroxypropyl methylcellulose / hypromellose, ipratropium-albuterol, sodium chloride, fluticasone, albuterol, mupirocin ointment, cetirizine, estradiol, hyoscyamine, EPINEPHrine, HYDROcodone-acetaminophen, cyanocobalamin, zolmitriptan, pantoprazole, linaclotide, ALPRAZolam, montelukast, lubiprostone, amoxicillin-clavulanate, furosemide, clopidogrel, and metroNIDAZOLE.   No orders of the defined types were placed in this encounter.     Start medications as prescribed and explained to  patient on After Visit Summary ( AVS). Risks, benefits, and alternatives of the medications and treatment plan prescribed today were discussed, and patient expressed understanding.   Education regarding symptom management and diagnosis given to patient.   Follow-up:Plan follow-up and return precautions given if any worsening symptoms or change in condition.   Continue to follow with Rica Mast, MD for routine health maintenance.   Anne [name] and I agreed with plan.   Mable Paris, FNP

## 2015-08-24 NOTE — Patient Instructions (Signed)
Drink plenty of water and take antibiotic as prescribed.   We are pending the urine culture to know the organism causing the infection and our office will call you with results. If the particular organism requires a different antibiotic than the on prescribed, we will place an order for a new prescription at that time.   If you symptoms worsen or you have new symptoms, please contact our office, or return to clinic for re evaluation.  Urinary Tract Infection Urinary tract infections (UTIs) can develop anywhere along your urinary tract. Your urinary tract is your body's drainage system for removing wastes and extra water. Your urinary tract includes two kidneys, two ureters, a bladder, and a urethra. Your kidneys are a pair of bean-shaped organs. Each kidney is about the size of your fist. They are located below your ribs, one on each side of your spine. CAUSES Infections are caused by microbes, which are microscopic organisms, including fungi, viruses, and bacteria. These organisms are so small that they can only be seen through a microscope. Bacteria are the microbes that most commonly cause UTIs. SYMPTOMS  Symptoms of UTIs may vary by age and gender of the patient and by the location of the infection. Symptoms in young women typically include a frequent and intense urge to urinate and a painful, burning feeling in the bladder or urethra during urination. Older women and men are more likely to be tired, shaky, and weak and have muscle aches and abdominal pain. A fever may mean the infection is in your kidneys. Other symptoms of a kidney infection include pain in your back or sides below the ribs, nausea, and vomiting. DIAGNOSIS To diagnose a UTI, your caregiver will ask you about your symptoms. Your caregiver will also ask you to provide a urine sample. The urine sample will be tested for bacteria and white blood cells. White blood cells are made by your body to help fight infection. TREATMENT    Typically, UTIs can be treated with medication. Because most UTIs are caused by a bacterial infection, they usually can be treated with the use of antibiotics. The choice of antibiotic and length of treatment depend on your symptoms and the type of bacteria causing your infection. HOME CARE INSTRUCTIONS  If you were prescribed antibiotics, take them exactly as your caregiver instructs you. Finish the medication even if you feel better after you have only taken some of the medication.  Drink enough water and fluids to keep your urine clear or pale yellow.  Avoid caffeine, tea, and carbonated beverages. They tend to irritate your bladder.  Empty your bladder often. Avoid holding urine for long periods of time.  Empty your bladder before and after sexual intercourse.  After a bowel movement, women should cleanse from front to back. Use each tissue only once. SEEK MEDICAL CARE IF:   You have back pain.  You develop a fever.  Your symptoms do not begin to resolve within 3 days. SEEK IMMEDIATE MEDICAL CARE IF:   You have severe back pain or lower abdominal pain.  You develop chills.  You have nausea or vomiting.  You have continued burning or discomfort with urination. MAKE SURE YOU:   Understand these instructions.  Will watch your condition.  Will get help right away if you are not doing well or get worse.   This information is not intended to replace advice given to you by your health care provider. Make sure you discuss any questions you have with your health care   provider.   Document Released: 12/21/2004 Document Revised: 12/02/2014 Document Reviewed: 04/21/2011 Elsevier Interactive Patient Education 2016 Elsevier Inc.    

## 2015-08-26 LAB — URINE CULTURE: Colony Count: >=100000

## 2015-08-27 ENCOUNTER — Telehealth: Payer: Self-pay | Admitting: Internal Medicine

## 2015-08-27 NOTE — Telephone Encounter (Signed)
Pt saw Arnett on Tuesday for a UTI. Pt is still not feeling well. She states she is not worse, but not better. Please call pt.

## 2015-08-27 NOTE — Telephone Encounter (Signed)
Patient c/o rt sided lower back pain, feels like "someone kicked her in the back", no appetitie, stomach swollen, top part of legs hurting, no fever but some chills. Spoke with Mable Paris NP and she advised patient to go to ER where they can do a CT of abdomen and see if there is a kidney stone or something else going on.  Advised patient and she was agreeable with the idea.

## 2015-08-29 ENCOUNTER — Encounter: Payer: Self-pay | Admitting: Internal Medicine

## 2015-08-30 ENCOUNTER — Ambulatory Visit (INDEPENDENT_AMBULATORY_CARE_PROVIDER_SITE_OTHER): Payer: Medicare Other | Admitting: Family Medicine

## 2015-08-30 ENCOUNTER — Encounter: Payer: Self-pay | Admitting: Family Medicine

## 2015-08-30 VITALS — BP 98/60 | HR 75 | Temp 97.0°F | Ht 63.0 in | Wt 105.5 lb

## 2015-08-30 DIAGNOSIS — N390 Urinary tract infection, site not specified: Secondary | ICD-10-CM | POA: Insufficient documentation

## 2015-08-30 LAB — POCT URINALYSIS DIPSTICK
Bilirubin, UA: NEGATIVE
Glucose, UA: NEGATIVE
Ketones, UA: NEGATIVE
Nitrite, UA: POSITIVE
Protein, UA: NEGATIVE
Spec Grav, UA: 1.015
Urobilinogen, UA: 0.2
pH, UA: 7

## 2015-08-30 MED ORDER — CEFDINIR 300 MG PO CAPS: 300.0000 mg | ORAL_CAPSULE | Freq: Two times a day (BID) | ORAL | Status: DC

## 2015-08-30 NOTE — Progress Notes (Signed)
Pre visit review using our clinic review tool, if applicable. No additional management support is needed unless otherwise documented below in the visit note. 

## 2015-08-30 NOTE — Patient Instructions (Signed)
Take the medication as prescribed.  Follow up as needed.  Take care  Dr. Lacinda Axon

## 2015-08-30 NOTE — Progress Notes (Signed)
Subjective:  Patient ID: Anne [name], female    DOB: 01/12/1951  Age: 65 y.o. MRN: [id number]  CC: Pain after urination  HPI:  65 year old female presents with the above complaints.  Patient was just seen on 5/30. She was diagnosed with UTI and was treated with Macrobid. She states that she has completed the course. She continues to have pain after urination. She also reports frequency. She has recently had hematuria as well. No associated fever. She has had chills. She reports some abdominal discomfort as well as right upper back pain. No reports of nausea or vomiting. No other complaints at this time. No known exacerbating or relieving factors.  Social Hx   Social History   Social History  . Marital Status: Married    Spouse Name: N/A  . Number of Children: 4  . Years of Education: N/A   Social History Main Topics  . Smoking status: Never Smoker   . Smokeless tobacco: Never Used  . Alcohol Use: No  . Drug Use: No  . Sexual Activity: Not Asked   Other Topics Concern  . None   Social History Narrative   Lives in [place] with husband.  She has four children.   Retired from [employer]  Gastrointestinal: Positive for abdominal pain.  Genitourinary: Positive for dysuria and frequency.  Musculoskeletal: Positive for back pain.   Objective:  BP 98/60 mmHg  Pulse 75  Temp(Src) 97 F (36.1 C) (Oral)  Ht 5\' 3"  (1.6 m)  Wt 105 lb 8 oz (47.854 kg)  BMI 18.69 kg/m2  SpO2 98%  BP/Weight 08/30/2015 08/24/2015 0000000  Systolic BP 98 98 [id number]  Diastolic BP 60 60 60  Wt. (Lbs) 105.5 106 108  BMI 18.69 18.78 19.14   Physical Exam  Constitutional: She is oriented to person, place, and time.  Thin female in NAD.  Cardiovascular: Normal rate and regular rhythm.   2/6 systolic murmur.  Pulmonary/Chest: Effort normal and breath sounds normal.  Abdominal: Soft. She exhibits no distension. There is no tenderness. There is no rebound and no  guarding.  No CVA tenderness.  Neurological: She is alert and oriented to person, place, and time.  Psychiatric: She has a normal mood and affect.  Vitals reviewed.  Lab Results  Component Value Date   WBC 4.3 02/17/2014   HGB 12.6 02/17/2014   HCT 37.9 02/17/2014   PLT 208 02/17/2014   GLUCOSE 80 04/28/2014   CHOL 201* 12/13/2012   TRIG 101.0 12/13/2012   HDL 91.50 12/13/2012   LDLDIRECT 92.7 12/13/2012   ALT 12 04/28/2014   AST 17 04/28/2014   NA 140 04/28/2014   K 3.8 04/28/2014   CL 104 04/28/2014   CREATININE 1.01 04/28/2014   BUN 15 04/28/2014   CO2 30 04/28/2014   TSH 1.28 04/28/2014   INR 1.0 10/22/2012   HGBA1C 5.9* 04/30/2013   Urinalysis    Component Value Date/Time   BILIRUBINUR neg 08/30/2015 1005   PROTEINUR neg. 08/30/2015 1005   UROBILINOGEN 0.2 08/30/2015 1005   NITRITE pos 08/30/2015 1005   LEUKOCYTESUR moderate (2+)* 08/30/2015 1005   Assessment & Plan:   Problem List Items Addressed This Visit    UTI (lower urinary tract infection) - Primary    Established problem, not improved. Patient with recent UTI. Culture was reviewed today. Patient with persistent symptoms and urinalysis suggestive of UTI. Questionable treatment failure. Sending for culture. Treating  empirically with Omnicef.      Relevant Medications   cefdinir (OMNICEF) 300 MG capsule   Other Relevant Orders   POCT Urinalysis Dipstick (Completed)   Urine Culture      Meds ordered this encounter  Medications  . cefdinir (OMNICEF) 300 MG capsule    Sig: Take 1 capsule (300 mg total) by mouth 2 (two) times daily.    Dispense:  14 capsule    Refill:  0   Follow-up: PRN  [place]

## 2015-08-30 NOTE — Assessment & Plan Note (Signed)
Established problem, not improved. Patient with recent UTI. Culture was reviewed today. Patient with persistent symptoms and urinalysis suggestive of UTI. Questionable treatment failure. Sending for culture. Treating empirically with Omnicef.

## 2015-09-01 LAB — URINE CULTURE: Colony Count: 50000

## 2015-09-02 ENCOUNTER — Telehealth: Payer: Self-pay | Admitting: *Deleted

## 2015-09-02 NOTE — Telephone Encounter (Signed)
Husband is aware per DPR. °

## 2015-09-02 NOTE — Telephone Encounter (Signed)
Patient has requested lab results  [phone]

## 2015-09-07 ENCOUNTER — Ambulatory Visit (INDEPENDENT_AMBULATORY_CARE_PROVIDER_SITE_OTHER): Payer: Medicare Other | Admitting: *Deleted

## 2015-09-07 DIAGNOSIS — E538 Deficiency of other specified B group vitamins: Secondary | ICD-10-CM | POA: Diagnosis not present

## 2015-09-07 MED ORDER — CYANOCOBALAMIN 1000 MCG/ML IJ SOLN
1000.0000 ug | Freq: Once | INTRAMUSCULAR | Status: AC
  Administered 2015-09-07: 1000 ug via INTRAMUSCULAR

## 2015-09-07 NOTE — Progress Notes (Signed)
Patient presented for B 12 injection to left deltoid, patient tolerated injection well.

## 2015-09-08 ENCOUNTER — Telehealth: Payer: Self-pay | Admitting: Internal Medicine

## 2015-09-08 MED ORDER — AZITHROMYCIN 250 MG PO TABS: ORAL_TABLET | ORAL | Status: AC

## 2015-09-08 MED ORDER — PREDNISONE 20 MG PO TABS: 20.0000 mg | ORAL_TABLET | Freq: Every day | ORAL | Status: DC

## 2015-09-08 NOTE — Telephone Encounter (Signed)
meds sent to pharmacy. Pt informed. Nothing further needed.

## 2015-09-08 NOTE — Telephone Encounter (Signed)
Please advise 

## 2015-09-08 NOTE — Telephone Encounter (Signed)
Pt states she thinks she is in need of Prednisone. Trouble breathing, walking upstairs please call.

## 2015-09-08 NOTE — Telephone Encounter (Signed)
Patient with pulmonary fibrosis now with sob, possible flare.   Plan: Zpak Prednisone 20mg  - 1 tab daily x 5 days.

## 2015-09-20 ENCOUNTER — Encounter: Payer: Self-pay | Admitting: Internal Medicine

## 2015-09-21 ENCOUNTER — Other Ambulatory Visit: Payer: Self-pay

## 2015-09-21 MED ORDER — METRONIDAZOLE 250 MG PO TABS: 250.0000 mg | ORAL_TABLET | Freq: Three times a day (TID) | ORAL | Status: DC

## 2015-09-21 NOTE — Telephone Encounter (Signed)
Ok to refill as requested 

## 2015-09-21 NOTE — Telephone Encounter (Signed)
OK to refill flagyl? Please advise.

## 2015-09-30 ENCOUNTER — Other Ambulatory Visit: Payer: Self-pay

## 2015-09-30 ENCOUNTER — Other Ambulatory Visit: Payer: Self-pay | Admitting: Internal Medicine

## 2015-09-30 MED ORDER — LINACLOTIDE 72 MCG PO CAPS: 72.0000 ug | ORAL_CAPSULE | Freq: Every day | ORAL | Status: DC

## 2015-09-30 NOTE — Telephone Encounter (Signed)
Medication refill

## 2015-10-01 NOTE — Telephone Encounter (Signed)
Refill request for Xanax, last seen [id number], last filled [id number].  Please advise.

## 2015-10-04 ENCOUNTER — Other Ambulatory Visit: Payer: Self-pay | Admitting: Pulmonary Disease

## 2015-10-04 ENCOUNTER — Encounter: Payer: Self-pay | Admitting: Internal Medicine

## 2015-10-04 MED ORDER — ALBUTEROL SULFATE HFA 108 (90 BASE) MCG/ACT IN AERS: 2.0000 | INHALATION_SPRAY | Freq: Four times a day (QID) | RESPIRATORY_TRACT | Status: DC | PRN

## 2015-10-06 ENCOUNTER — Telehealth: Payer: Self-pay | Admitting: Internal Medicine

## 2015-10-06 ENCOUNTER — Other Ambulatory Visit: Payer: Self-pay

## 2015-10-06 DIAGNOSIS — R140 Abdominal distension (gaseous): Secondary | ICD-10-CM

## 2015-10-06 NOTE — Telephone Encounter (Signed)
Pt states she feels like her "GI system is out of balance." States she is having a lot of bloating, gas and pain. She is taking linzess, eating yogurt, and taking a probiotic. States she is taking the Flagyl again and it is not working this time for her, wonders if there is anything else she could take and perhaps go back to the flagyl. Wonders if she has developed a "tolerance" to the flagyl. Please advise.

## 2015-10-06 NOTE — Telephone Encounter (Signed)
Spoke with pt and she is aware. Order and records [facility].

## 2015-10-06 NOTE — Telephone Encounter (Signed)
Discontinue Flagyl Trial of rifaximin 550 mg 3 times a day 14 days. When this medication is process please note nonresponse to Flagyl with allergy to quinolones

## 2015-10-07 ENCOUNTER — Telehealth: Payer: Self-pay | Admitting: Internal Medicine

## 2015-10-07 NOTE — Telephone Encounter (Signed)
Quantity was for # 42 with zero refills Netta at [facility] was notified

## 2015-10-08 ENCOUNTER — Other Ambulatory Visit: Payer: Self-pay | Admitting: Internal Medicine

## 2015-10-08 ENCOUNTER — Telehealth: Payer: Self-pay | Admitting: Internal Medicine

## 2015-10-08 NOTE — Telephone Encounter (Signed)
No other great options for SIBO, given her multiple allergies She has tried Augmentin and Flagyl I would rather not have her on continuous abx, which only eventually leads to resistance and further disruption of the gut microbiome Did the cramping and bloating start with the [place]?  This med can have these side effects Is she using Levsin for the cramping and if so, does it help? Would d/c ABX for now ROV next available, keep me up to date with symptoms

## 2015-10-08 NOTE — Telephone Encounter (Signed)
Left message for pt to call back  °

## 2015-10-08 NOTE — Telephone Encounter (Signed)
Pt cannot afford the xifaxan. Please advise of any alternative for pt.

## 2015-10-08 NOTE — Telephone Encounter (Signed)
Spoke with pt and she is aware.

## 2015-10-11 ENCOUNTER — Encounter: Payer: Self-pay | Admitting: Internal Medicine

## 2015-10-11 ENCOUNTER — Telehealth: Payer: Self-pay | Admitting: Internal Medicine

## 2015-10-11 DIAGNOSIS — J441 Chronic obstructive pulmonary disease with (acute) exacerbation: Secondary | ICD-10-CM

## 2015-10-11 MED ORDER — AZITHROMYCIN 250 MG PO TABS: ORAL_TABLET | ORAL | Status: AC

## 2015-10-11 MED ORDER — PREDNISONE 20 MG PO TABS: 20.0000 mg | ORAL_TABLET | Freq: Every day | ORAL | Status: DC

## 2015-10-11 NOTE — Telephone Encounter (Signed)
Pt requesting Prednisone stating she isn't feeling well. Pt had Prednisone 20mg  x 5 days and a ZPak on 09/08/15. Please advise.

## 2015-10-11 NOTE — Telephone Encounter (Signed)
Pt calling asking for a refill for prednisone.  [facility] [address]  She is not feeling well.  Please advise

## 2015-10-11 NOTE — Telephone Encounter (Signed)
2 view cxr Prednisone 20mg  x 5 days Zpak

## 2015-10-11 NOTE — Telephone Encounter (Signed)
Pt informed of response.

## 2015-10-12 ENCOUNTER — Telehealth: Payer: Self-pay | Admitting: *Deleted

## 2015-10-12 ENCOUNTER — Ambulatory Visit: Payer: Medicare Other

## 2015-10-12 ENCOUNTER — Ambulatory Visit
Admission: RE | Admit: 2015-10-12 | Discharge: 2015-10-12 | Disposition: A | Discharge status: Home / Self Care | Payer: Medicare Other | Source: Ambulatory Visit | Attending: Internal Medicine | Admitting: Internal Medicine

## 2015-10-12 DIAGNOSIS — J441 Chronic obstructive pulmonary disease with (acute) exacerbation: Secondary | ICD-10-CM | POA: Diagnosis not present

## 2015-10-12 DIAGNOSIS — R918 Other nonspecific abnormal finding of lung field: Secondary | ICD-10-CM | POA: Diagnosis not present

## 2015-10-12 DIAGNOSIS — Z930 Tracheostomy status: Secondary | ICD-10-CM | POA: Insufficient documentation

## 2015-10-12 NOTE — Telephone Encounter (Signed)
Pt informed. Nothing further needed. 

## 2015-10-12 NOTE — Telephone Encounter (Signed)
LMOM for pt to return call. 

## 2015-10-12 NOTE — Telephone Encounter (Signed)
-----   Message from Vilinda Boehringer, MD sent at 10/12/2015 11:58 AM EDT ----- Regarding: CXR Results Please informed patient that I reviewed her chest x-ray, there is no signs of active infection, no signs of pneumonia.  Thank you

## 2015-10-19 ENCOUNTER — Ambulatory Visit (INDEPENDENT_AMBULATORY_CARE_PROVIDER_SITE_OTHER): Payer: Medicare Other

## 2015-10-19 DIAGNOSIS — E538 Deficiency of other specified B group vitamins: Secondary | ICD-10-CM

## 2015-10-19 MED ORDER — CYANOCOBALAMIN 1000 MCG/ML IJ SOLN
1000.0000 ug | Freq: Once | INTRAMUSCULAR | Status: AC
  Administered 2015-10-19: 1000 ug via INTRAMUSCULAR

## 2015-10-19 NOTE — Progress Notes (Signed)
Patient was in today receiving a B12 injection in her right deltoid. Patient tolerated well.

## 2015-10-28 ENCOUNTER — Encounter: Payer: Self-pay | Admitting: Internal Medicine

## 2015-10-28 ENCOUNTER — Ambulatory Visit (INDEPENDENT_AMBULATORY_CARE_PROVIDER_SITE_OTHER): Payer: Medicare Other | Admitting: Internal Medicine

## 2015-10-28 VITALS — BP 106/60 | HR 73 | Ht 63.0 in | Wt 108.2 lb

## 2015-10-28 DIAGNOSIS — M47812 Spondylosis without myelopathy or radiculopathy, cervical region: Secondary | ICD-10-CM

## 2015-10-28 DIAGNOSIS — Z1159 Encounter for screening for other viral diseases: Secondary | ICD-10-CM

## 2015-10-28 DIAGNOSIS — R300 Dysuria: Secondary | ICD-10-CM | POA: Diagnosis not present

## 2015-10-28 DIAGNOSIS — J8410 Pulmonary fibrosis, unspecified: Secondary | ICD-10-CM

## 2015-10-28 DIAGNOSIS — G894 Chronic pain syndrome: Secondary | ICD-10-CM | POA: Diagnosis not present

## 2015-10-28 LAB — CBC WITH DIFFERENTIAL/PLATELET
Basophils Absolute: 0 10*3/uL (ref 0.0–0.1)
Basophils Relative: 0.5 % (ref 0.0–3.0)
Eosinophils Absolute: 0.2 10*3/uL (ref 0.0–0.7)
Eosinophils Relative: 4 % (ref 0.0–5.0)
HCT: 37 % (ref 36.0–46.0)
Hemoglobin: 12.3 g/dL (ref 12.0–15.0)
Lymphocytes Relative: 24.5 % (ref 12.0–46.0)
Lymphs Abs: 1.4 10*3/uL (ref 0.7–4.0)
MCHC: 33.3 g/dL (ref 30.0–36.0)
MCV: 94.4 fl (ref 78.0–100.0)
Monocytes Absolute: 0.5 10*3/uL (ref 0.1–1.0)
Monocytes Relative: 8.5 % (ref 3.0–12.0)
Neutro Abs: 3.6 10*3/uL (ref 1.4–7.7)
Neutrophils Relative %: 62.5 % (ref 43.0–77.0)
Platelets: 214 10*3/uL (ref 150.0–400.0)
RBC: 3.92 Mil/uL (ref 3.87–5.11)
RDW: 13.1 % (ref 11.5–15.5)
WBC: 5.8 10*3/uL (ref 4.0–10.5)

## 2015-10-28 LAB — COMPREHENSIVE METABOLIC PANEL
ALT: 12 U/L (ref 0–35)
AST: 18 U/L (ref 0–37)
Albumin: 4.2 g/dL (ref 3.5–5.2)
Alkaline Phosphatase: 87 U/L (ref 39–117)
BUN: 14 mg/dL (ref 6–23)
CO2: 30 mEq/L (ref 19–32)
Calcium: 9.6 mg/dL (ref 8.4–10.5)
Chloride: 103 mEq/L (ref 96–112)
Creatinine, Ser: 1.05 mg/dL (ref 0.40–1.20)
GFR: 55.71 mL/min — ABNORMAL LOW (ref >60.00)
Glucose, Bld: 86 mg/dL (ref 70–99)
Potassium: 3.4 mEq/L — ABNORMAL LOW (ref 3.5–5.1)
Sodium: 141 mEq/L (ref 135–145)
Total Bilirubin: 0.4 mg/dL (ref 0.2–1.2)
Total Protein: 6.9 g/dL (ref 6.0–8.3)

## 2015-10-28 LAB — POCT URINALYSIS DIPSTICK
Bilirubin, UA: NEGATIVE
Blood, UA: NEGATIVE
Glucose, UA: NEGATIVE
Ketones, UA: NEGATIVE
Leukocytes, UA: NEGATIVE
Nitrite, UA: NEGATIVE
Protein, UA: NEGATIVE
Spec Grav, UA: 1.015
Urobilinogen, UA: 1
pH, UA: 6.5

## 2015-10-28 NOTE — Assessment & Plan Note (Signed)
Symptomatically, relatively stable. Will continue follow up with pulmonary.

## 2015-10-28 NOTE — Progress Notes (Signed)
Pre visit review using our clinic review tool, if applicable. No additional management support is needed unless otherwise documented below in the visit note. 

## 2015-10-28 NOTE — Assessment & Plan Note (Signed)
Degenerative changes in the cervical spine. She reports that she is planning for surgical intervention. Will try to get notes from Dr. Ronnald Ramp. Continue Ibuprofen as needed for pain.

## 2015-10-28 NOTE — Progress Notes (Signed)
Subjective:    Patient ID: Anne [name], female    DOB: February 04, 1951, 65 y.o.   MRN: [id number]  HPI  65YO female presents for follow up  Has degenerative changes in cervical spine. Had MRI and was seen by Dr. Sherley Bounds. Plans to have surgical intervention. Using ibuprofen right now for pain. Has almost continuous pain.  Breathing relatively unchanged. Feels choked more on foods at times. Continues to follow up with pulmonary.   Wt Readings from Last 3 Encounters:  10/28/15 108 lb 3.2 oz (49.1 kg)  08/30/15 105 lb 8 oz (47.9 kg)  08/24/15 106 lb (48.1 kg)   BP Readings from Last 3 Encounters:  10/28/15 106/60  08/30/15 98/60  08/24/15 98/60    Past Medical History:  Diagnosis Date  . Allergic rhinitis   . Anemia   . Asthma   . Complication of anesthesia    Breathing problems. 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  [name] COPD (chronic obstructive pulmonary disease) ([place])   . CVA (cerebral infarction)   . Esophageal dysmotility   . Heart murmur    as child  . IBS (irritable bowel syndrome)   . Migraine   . Problems with swallowing    intermittently  . Pulmonary fibrosis ([place])   . Shingles   . Stroke [facility])    slurred speech, drawn face, imaging normal, occurred twice, [facility]  . Tracheostomy in place [facility])   . Vocal cord paresis    Family History  Problem Relation Age of Onset  . Asthma Cousin   . COPD Cousin   . Breast cancer Maternal Grandmother 60  . Cancer Maternal Grandmother     breast  . Breast cancer Maternal Aunt 54  . Cancer Maternal Aunt     breast  . Asthma Father   . Cancer Father     lung  . Cancer Paternal Uncle     lung  . COPD Paternal Grandfather    Past Surgical History:  Procedure Laterality Date  . ABDOMINAL HYSTERECTOMY    . BOTOX INJECTION N/A 07/29/2013   Procedure: BOTOX INJECTION;  Surgeon: Jerene Bears, MD;  Location: [facility];  Service: Gastroenterology;  Laterality: N/A;  . BOTOX INJECTION N/A 05/04/2015   Procedure: BOTOX  INJECTION;  Surgeon: Jerene Bears, MD;  Location: [facility];  Service: Gastroenterology;  Laterality: N/A;  . ESOPHAGEAL MANOMETRY N/A 12/16/2012   Procedure: ESOPHAGEAL MANOMETRY (EM);  Surgeon: Jerene Bears, MD;  Location: [facility];  Service: Gastroenterology;  Laterality: N/A;  . ESOPHAGOGASTRODUODENOSCOPY (EGD) WITH PROPOFOL N/A 07/29/2013   Procedure: ESOPHAGOGASTRODUODENOSCOPY (EGD) WITH PROPOFOL;  Surgeon: Jerene Bears, MD;  Location: [facility];  Service: Gastroenterology;  Laterality: N/A;  with botox injection  . ESOPHAGOGASTRODUODENOSCOPY (EGD) WITH PROPOFOL N/A 05/04/2015   Procedure: ESOPHAGOGASTRODUODENOSCOPY (EGD) WITH PROPOFOL;  Surgeon: Jerene Bears, MD;  Location: [facility];  Service: Gastroenterology;  Laterality: N/A;  . EYE SURGERY     Catarct surgery 2014  . Eye Surgery AS Child    . JEJUNOSTOMY FEEDING TUBE     x2 both failed. no longer has  . TRACHEOSTOMY  1996   done at [facility], Dr. Kathyrn Sheriff  . TUBAL LIGATION    . VIDEO BRONCHOSCOPY Bilateral 11/20/2012   Procedure: VIDEO BRONCHOSCOPY WITH FLUORO;  Surgeon: Juanito Doom, MD;  Location: [facility];  Service: Cardiopulmonary;  Laterality: Bilateral;   Social History   Social History  . Marital status: Married    Spouse name: N/A  .  Number of children: 4  . Years of education: N/A   Social History Main Topics  . Smoking status: Never Smoker  . Smokeless tobacco: Never Used  . Alcohol use No  . Drug use: No  . Sexual activity: Not Asked   Other Topics Concern  . None   Social History Narrative   Lives in [place] with husband.  She has four children.   Retired from [employer]  Constitutional: Negative for appetite change, chills, fatigue, fever and unexpected weight change.  Eyes: Negative for visual disturbance.  Respiratory: Positive for cough, choking and shortness of breath (chronic). Negative for chest tightness and wheezing.   Cardiovascular: Negative for  chest pain, palpitations and leg swelling.  Gastrointestinal: Negative for abdominal distention, abdominal pain, constipation, diarrhea, nausea and vomiting.  Musculoskeletal: Positive for arthralgias, back pain, myalgias and neck pain. Negative for neck stiffness.  Skin: Negative for color change and rash.  Neurological: Negative for weakness.  Hematological: Negative for adenopathy. Does not bruise/bleed easily.  Psychiatric/Behavioral: Negative for dysphoric mood and sleep disturbance. The patient is not nervous/anxious.        Objective:    BP 106/60 (BP Location: Left Arm, Patient Position: Sitting, Cuff Size: Normal)   Pulse 73   Ht 5\' 3"  (1.6 m)   Wt 108 lb 3.2 oz (49.1 kg)   SpO2 98%   BMI 19.17 kg/m  Physical Exam  Constitutional: She is oriented to person, place, and time. She appears well-developed and well-nourished. No distress.  HENT:  Head: Normocephalic and atraumatic.  Right Ear: External ear normal.  Left Ear: External ear normal.  Nose: Nose normal.  Mouth/Throat: Oropharynx is clear and moist. No oropharyngeal exudate.  Eyes: Conjunctivae are normal. Pupils are equal, round, and reactive to light. Right eye exhibits no discharge. Left eye exhibits no discharge. No scleral icterus.  Neck: Normal range of motion. Neck supple. No tracheal deviation present. No thyromegaly present.  Cardiovascular: Normal rate, regular rhythm, normal heart sounds and intact distal pulses.  Exam reveals no gallop and no friction rub.   No murmur heard. Pulmonary/Chest: Effort normal and breath sounds normal. No respiratory distress. She has no wheezes. She has no rales. She exhibits no tenderness.  Musculoskeletal: Normal range of motion. She exhibits no edema.       Cervical back: She exhibits pain. She exhibits normal range of motion, no tenderness and no swelling.  Lymphadenopathy:    She has no cervical adenopathy.  Neurological: She is alert and oriented to person, place, and  time. No cranial nerve deficit. She exhibits normal muscle tone. Coordination normal.  Skin: Skin is warm and dry. No rash noted. She is not diaphoretic. No erythema. No pallor.  Psychiatric: She has a normal mood and affect. Her behavior is normal. Judgment and thought content normal.          Assessment & Plan:  Over 77min of which >50% spent in face-to-face contact with patient discussing plan of care  Problem List Items Addressed This Visit      Unprioritized   Chronic pain syndrome (Chronic)    Chronic pain in back secondary to DJD and in diffuse joints, likely related to OA. Will continue prn ibuprofen.      Degenerative arthritis of cervical spine    Degenerative changes in the cervical spine. She reports that she is planning for surgical intervention. Will try to get notes  from Dr. Ronnald Ramp. Continue Ibuprofen as needed for pain.      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 Will continue follow up with pulmonary.      Relevant Orders   Comprehensive metabolic panel   CBC with Differential/Platelet    Other Visit Diagnoses    Need for hepatitis C screening test       Relevant Orders   Hepatitis C antibody   Dysuria       Relevant Orders   POCT Urinalysis Dipstick       Return in about 4 weeks (around 11/25/2015) for New Patient.  Ronette Deter, MD Internal Medicine [facility]

## 2015-10-28 NOTE — Patient Instructions (Signed)
Labs today.  Follow up in 4 weeks. 

## 2015-10-28 NOTE — Assessment & Plan Note (Signed)
" >>  ASSESSMENT AND PLAN FOR PULMONARY FIBROSIS (HCC) WRITTEN ON 10/28/2015  9:31 AM BY VANNIE, Malekai Markwood A, MD  Symptomatically, relatively stable. Will continue follow up with pulmonary. "

## 2015-10-28 NOTE — Assessment & Plan Note (Signed)
Chronic pain in back secondary to DJD and in diffuse joints, likely related to OA. Will continue prn ibuprofen.

## 2015-10-29 LAB — HEPATITIS C ANTIBODY: HCV Ab: NEGATIVE

## 2015-11-09 ENCOUNTER — Other Ambulatory Visit: Payer: Self-pay | Admitting: Internal Medicine

## 2015-11-09 MED ORDER — CETIRIZINE HCL 10 MG PO TABS: ORAL_TABLET | ORAL | 11 refills | Status: DC

## 2015-11-09 MED ORDER — ESTRADIOL 1 MG PO TABS: 1.0000 mg | ORAL_TABLET | Freq: Every day | ORAL | 3 refills | Status: DC

## 2015-11-09 NOTE — Telephone Encounter (Signed)
Pt called about needing a refill for cetirizine (ZYRTEC) 10 MG tablet, ALPRAZolam (XANAX) 0.25 MG tablet and estradiol (ESTRACE) 1 MG tablet. Pt has a appt 10/04.   [id number] [place], [place]  Call pt @ [phone] or cell [phone]. Thank you!

## 2015-11-09 NOTE — Telephone Encounter (Signed)
Please advise for refill on Xanax, thanks

## 2015-11-09 NOTE — Addendum Note (Signed)
Addended by: Bevelyn Ngo on: 11/09/2015 12:40 PM   Modules accepted: Orders

## 2015-11-09 NOTE — Telephone Encounter (Signed)
Refilled. thanks

## 2015-11-10 NOTE — Telephone Encounter (Signed)
Please determine the diagnosis for which this patient takes the Xanax.

## 2015-11-10 NOTE — Addendum Note (Signed)
Addended by: [name] on: 11/10/2015 04:59 PM   Modules accepted: Orders

## 2015-11-11 MED ORDER — ALPRAZOLAM 0.25 MG PO TABS: 0.1250 mg | ORAL_TABLET | Freq: Two times a day (BID) | ORAL | 0 refills | Status: DC | PRN

## 2015-11-11 NOTE — Addendum Note (Signed)
Addended by: [name] Micheal Murad G on: 11/11/2015 11:52 AM   Modules accepted: Orders

## 2015-11-11 NOTE — Telephone Encounter (Signed)
Refill given. Will need to discuss further at her next follow-up.

## 2015-11-11 NOTE — Telephone Encounter (Signed)
Spoke with the patient, was given the Xanax years ago for panic attacks in the recent years has been weaning off them.  She is currently taking .25mg  and it is broken in half (half in the AM and half in the PM)  Patient states that on some days she doesn't take it it at all.

## 2015-11-23 ENCOUNTER — Ambulatory Visit (INDEPENDENT_AMBULATORY_CARE_PROVIDER_SITE_OTHER): Payer: Medicare Other

## 2015-11-23 DIAGNOSIS — E538 Deficiency of other specified B group vitamins: Secondary | ICD-10-CM | POA: Diagnosis not present

## 2015-11-23 MED ORDER — CYANOCOBALAMIN 1000 MCG/ML IJ SOLN
1000.0000 ug | Freq: Once | INTRAMUSCULAR | Status: AC
  Administered 2015-11-23: 1000 ug via INTRAMUSCULAR

## 2015-11-23 NOTE — Progress Notes (Signed)
Pt is in today receiving a B12 injection in the left deltoid. Pt tolerated well.

## 2015-12-02 ENCOUNTER — Telehealth: Payer: Self-pay | Admitting: Internal Medicine

## 2015-12-02 MED ORDER — PREDNISONE 20 MG PO TABS: 40.0000 mg | ORAL_TABLET | Freq: Every day | ORAL | 0 refills | Status: DC

## 2015-12-02 NOTE — Telephone Encounter (Signed)
Spoke with DK and he says to send in Prednisone 40 mg daily x 7 days and to f/u with VM. If symptoms get worse she needs to go to ER.   RX sent and pt informed and appt scheduled. Nothing further needed.

## 2015-12-02 NOTE — Telephone Encounter (Signed)
Pt states she is having more SOB and prod cough and she is asking for Prednisone. She is Mungal pt. She is COPD. Please advise.

## 2015-12-02 NOTE — Telephone Encounter (Signed)
Pt is requesting Prednisone called in. Please call.

## 2015-12-13 ENCOUNTER — Encounter: Payer: Self-pay | Admitting: Internal Medicine

## 2015-12-13 ENCOUNTER — Ambulatory Visit (INDEPENDENT_AMBULATORY_CARE_PROVIDER_SITE_OTHER): Payer: Medicare Other | Admitting: Internal Medicine

## 2015-12-13 VITALS — BP 100/60 | HR 68 | Ht 62.6 in | Wt 107.2 lb

## 2015-12-13 DIAGNOSIS — K2970 Gastritis, unspecified, without bleeding: Secondary | ICD-10-CM | POA: Diagnosis not present

## 2015-12-13 DIAGNOSIS — K220 Achalasia of cardia: Secondary | ICD-10-CM

## 2015-12-13 DIAGNOSIS — K224 Dyskinesia of esophagus: Secondary | ICD-10-CM

## 2015-12-13 DIAGNOSIS — R1030 Lower abdominal pain, unspecified: Secondary | ICD-10-CM

## 2015-12-13 DIAGNOSIS — K5900 Constipation, unspecified: Secondary | ICD-10-CM | POA: Diagnosis not present

## 2015-12-13 NOTE — Progress Notes (Signed)
Subjective:    Patient ID: Anne [name], female    DOB: Aug 17, 1950, 65 y.o.   MRN: [id number]  HPI Anne [name] is a 65 year old female with a history of esophageal dysphagia, hypertensive LES improved with Botox injection, chronic constipation with abdominal pain, pulmonary fibrosis, vocal cord dysfunction, permanent tracheostomy, history of TIA and migraines who is here for follow-up. She was last seen in the office on 06/30/2015. She last had an upper endoscopy on 05/04/2015 with Botox to the LES, which was her second such injection. Gastritis on that day was biopsied revealing reactive gastropathy without H. pylori, metaplasia or dysplasia.  She returns today to discuss her constipation and her abdominal pain and "swelling". She reports that she continues to develop primarily left-sided abdominal discomfort associated with bloating and gas. Her bowel movements have been somewhat erratic though she will have normal bowel movements on some days and then skip several days. Her abdominal pain is worse when she feels constipated. It also causes lower back pain worse on the left. She tried an over-the-counter women's laxative which caused severe cramping pain and nausea. She has been treated with several rounds of antibiotics due to positive response of the possibility of concomitant bacterial overgrowth. Rifaximin was not covered and so she was treated with Augmentin. This does seem to significantly help her. Linzess was recommended at 72 g daily but not covered by her formulary. She is not currently using a daily laxative. Levsin was not helpful. She has found for some reason that prednisone 5-10 mg taken every 3-4 days seems to help her "swelling" in her left abdomen and thus her left-sided abdominal discomfort. She does use ibuprofen on a daily basis for back, shoulder and cervical disc related pain. She seen orthopedics and they have recommended a cervical surgery which would require a posterior  approach due to her tracheostomy. She has been hesitant to proceed because she knows the recovery will be slow and she will require narcotic pain medications and she worries about exacerbating her GI symptoms. She denies seeing blood or melena in her stool. Appetite for her has been regular. She feels like the Botox has definitely been helpful for her and she feels like slowly some of her symptoms prior to Botox injection have returned but she does not want to proceed with repeat injection at this time. Swallowing his been improved after injection.  Review of records revealed normal colonoscopy on 10/30/2008 performed in [place] per history of present illness, otherwise negative  Current Medications, Allergies, Past Medical History, Past Surgical History, Family History and Social History were reviewed in [facility] record.     Objective:   Physical Exam BP 100/60 (BP Location: Left Arm, Patient Position: Sitting, Cuff Size: Normal)   Pulse 68   Ht 5' 2.6" (1.59 m) Comment: height measured without shoes  Wt 107 lb 4 oz (48.6 kg)   BMI 19.24 kg/m  Constitutional: Well-developed and well-nourished. No distress. HEENT: Normocephalic and atraumatic.  Conjunctivae are normal.  No scleral icterus. Neck: Neck supple.Tracheostomy in place. Cardiovascular: Normal rate, regular rhythm and intact distal pulses. No M/R/G Pulmonary/chest: Effort normal and breath sounds normal.  Abdominal: Soft, lower abdominal tenderness without rebound or guarding which is mild, nondistended. Bowel sounds active throughout. There are no masses palpable. No hepatosplenomegaly. Extremities: no clubbing, cyanosis, or edema Lymphadenopathy: No cervical adenopathy noted. Neurological: Alert and oriented to person place and time. Skin: Skin is warm  and dry. No rashes noted. Psychiatric: Normal mood and affect. Behavior is normal.  CBC    Component Value  Date/Time   WBC 5.8 10/28/2015 0923   RBC 3.92 10/28/2015 0923   HGB 12.3 10/28/2015 0923   HGB 12.6 02/17/2014 1303   HCT 37.0 10/28/2015 0923   HCT 37.9 02/17/2014 1303   PLT 214.0 10/28/2015 0923   PLT 208 02/17/2014 1303   MCV 94.4 10/28/2015 0923   MCV 97 02/17/2014 1303   MCH 32.3 02/17/2014 1303   MCHC 33.3 10/28/2015 0923   RDW 13.1 10/28/2015 0923   RDW 12.5 02/17/2014 1303   LYMPHSABS 1.4 10/28/2015 0923   LYMPHSABS 1.2 02/17/2014 1303   MONOABS 0.5 10/28/2015 0923   MONOABS 0.3 02/17/2014 1303   EOSABS 0.2 10/28/2015 0923   EOSABS 0.2 02/17/2014 1303   BASOSABS 0.0 10/28/2015 0923   BASOSABS 0.0 02/17/2014 1303   CMP     Component Value Date/Time   NA 141 10/28/2015 0923   K 3.4 (L) 10/28/2015 0923   K 3.6 04/03/2012 1653   CL 103 10/28/2015 0923   CO2 30 10/28/2015 0923   GLUCOSE 86 10/28/2015 0923   BUN 14 10/28/2015 0923   CREATININE 1.05 10/28/2015 0923   CALCIUM 9.6 10/28/2015 0923   PROT 6.9 10/28/2015 0923   ALBUMIN 4.2 10/28/2015 0923   AST 18 10/28/2015 0923   ALT 12 10/28/2015 0923   ALKPHOS 87 10/28/2015 0923   BILITOT 0.4 10/28/2015 0923   CT scan from 10/03/2013 reviewed ordered by primary care to evaluate diarrhea and abdominal pain. There was thickening in the distal gastric body and antrum. Constipation was seen. Small and large bowel appeared normal.    Assessment & Plan:   65 year old female with a history of esophageal dysphagia, hypertensive LES improved with Botox injection, chronic constipation with abdominal pain, pulmonary fibrosis, vocal cord dysfunction, permanent tracheostomy, history of TIA and migraines who is here for follow-up.   1. Chronic constipation/lower abdominal pain -- her symptoms respond to antibiotics and she feels prednisone, which would not be typical for constipation alone. It raises the question of a possible colitis though colitis is never been seen on CT scan or at her colonoscopy. She does continue to have  significant constipation causing abdominal discomfort and bloating. I recommended we work on treating the constipation and if symptoms persist consider colonoscopy. She had diarrhea with 145 g Linzess and the lower dose is not on been on her formulary. She was given samples of Linzess 72 g daily to use once daily. She is to notify me should she develop diarrhea which does not improve after several days. Discontinue Levsin due to lack of response.  2. Gastritis and epigastric pain -- improved with PPI and likely secondary to frequent NSAID use. She will continue pantoprazole 40 mg daily.  3. Esophageal dysmotility/hypertensive LES -- improved with Botox injection. Repeat Botox injection is an option in the future though we discussed how this can lead to scarring and stricture formation which can worsen symptoms. For this reason we would like to do this only when absolutely necessary. She understands this recommendation. Continue soft diet and reflux precautions. PPI as per #2  She will follow up in December, sooner if necessary 40 minutes spent with the patient today. Greater than 50% was spent in counseling and coordination of care with the patient

## 2015-12-13 NOTE — Patient Instructions (Addendum)
Continue Protonix daily.  Discontinue Levsin.  Take samples of Linzess 72 mcg.  Follow up with Dr Hilarie Fredrickson on Thursday, 03/09/16 @ 9:30 am.  If you are age 66 or older, your body mass index should be between 23-30. Your Body mass index is 19.24 kg/m. If this is out of the aforementioned range listed, please consider follow up with your Primary Care Provider.  If you are age 63 or younger, your body mass index should be between 19-25. Your Body mass index is 19.24 kg/m. If this is out of the aformentioned range listed, please consider follow up with your Primary Care Provider.

## 2015-12-15 ENCOUNTER — Encounter: Payer: Self-pay | Admitting: Family Medicine

## 2015-12-15 ENCOUNTER — Ambulatory Visit (INDEPENDENT_AMBULATORY_CARE_PROVIDER_SITE_OTHER): Payer: Medicare Other | Admitting: Family Medicine

## 2015-12-15 VITALS — BP 114/68 | HR 80 | Temp 98.4°F | Ht 63.0 in | Wt 106.6 lb

## 2015-12-15 DIAGNOSIS — R1030 Lower abdominal pain, unspecified: Secondary | ICD-10-CM | POA: Diagnosis not present

## 2015-12-15 DIAGNOSIS — R300 Dysuria: Secondary | ICD-10-CM | POA: Insufficient documentation

## 2015-12-15 LAB — POCT URINALYSIS DIPSTICK
Bilirubin, UA: NEGATIVE
Blood, UA: NEGATIVE
Glucose, UA: NEGATIVE
Ketones, UA: NEGATIVE
Leukocytes, UA: NEGATIVE
Nitrite, UA: NEGATIVE
Protein, UA: NEGATIVE
Spec Grav, UA: 1.015
Urobilinogen, UA: 0.2
pH, UA: 6.5

## 2015-12-15 LAB — COMPREHENSIVE METABOLIC PANEL
ALT: 14 U/L (ref 0–35)
AST: 21 U/L (ref 0–37)
Albumin: 4.2 g/dL (ref 3.5–5.2)
Alkaline Phosphatase: 80 U/L (ref 39–117)
BUN: 15 mg/dL (ref 6–23)
CO2: 33 mEq/L — ABNORMAL HIGH (ref 19–32)
Calcium: 9.5 mg/dL (ref 8.4–10.5)
Chloride: 101 mEq/L (ref 96–112)
Creatinine, Ser: 1.12 mg/dL (ref 0.40–1.20)
GFR: 51.69 mL/min — ABNORMAL LOW (ref >60.00)
Glucose, Bld: 81 mg/dL (ref 70–99)
Potassium: 3 mEq/L — ABNORMAL LOW (ref 3.5–5.1)
Sodium: 140 mEq/L (ref 135–145)
Total Bilirubin: 0.7 mg/dL (ref 0.2–1.2)
Total Protein: 7.1 g/dL (ref 6.0–8.3)

## 2015-12-15 LAB — CBC
HCT: 38.8 % (ref 36.0–46.0)
Hemoglobin: 13.3 g/dL (ref 12.0–15.0)
MCHC: 34.3 g/dL (ref 30.0–36.0)
MCV: 92.6 fl (ref 78.0–100.0)
Platelets: 239 10*3/uL (ref 150.0–400.0)
RBC: 4.19 Mil/uL (ref 3.87–5.11)
RDW: 13.1 % (ref 11.5–15.5)
WBC: 5.5 10*3/uL (ref 4.0–10.5)

## 2015-12-15 LAB — SEDIMENTATION RATE: Sed Rate: 2 mm/hr (ref 0–30)

## 2015-12-15 MED ORDER — CEPHALEXIN 500 MG PO CAPS: 500.0000 mg | ORAL_CAPSULE | Freq: Two times a day (BID) | ORAL | 0 refills | Status: DC

## 2015-12-15 NOTE — Progress Notes (Signed)
Pre visit review using our clinic review tool, if applicable. No additional management support is needed unless otherwise documented below in the visit note. 

## 2015-12-15 NOTE — Assessment & Plan Note (Signed)
Patient with several day history of dysuria in combination with frequency, urgency, suprapubic discomfort, and mild low back pain. UA today is unremarkable. No obvious findings on back exam. Neurologically intact in lower extremities. I discussed options for evaluation and management with the patient including pelvic exam, lab work, urine culture, and empiric treatment with antibiotics. Patient declined pelvic exam. We opted for lab work, urine culture, and empiric treatment with antibiotics. Discussed that if she develops any new or changing symptoms she should seek medical attention immediately.

## 2015-12-15 NOTE — Progress Notes (Signed)
  Tommi Rumps, MD Phone: [phone]  Anne [name] is a 65 y.o. female who presents today for same-day visit.  Patient reports concern for UTI. Notes 5 days ago started with mild low back discomfort around her suprapubic region. Followed by dysuria, frequency, and urgency. Has had a few chills and no fevers. Some nausea though no vomiting or diarrhea. No vaginal discharge. She is status post hysterectomy and BSO. Status post cholecystectomy. No numbness or weakness in her legs, loss of bowel or bladder function, saddle anesthesia, or fevers. Still has her appendix. Reports this is similar to her prior UTIs.  PMH: nonsmoker.   ROS see history of present illness  Objective  Physical Exam Vitals:   12/15/15 0940  BP: 114/68  Pulse: 80  Temp: 98.4 F (36.9 C)    BP Readings from Last 3 Encounters:  12/15/15 114/68  12/13/15 100/60  10/28/15 106/60   Wt Readings from Last 3 Encounters:  12/15/15 106 lb 9.6 oz (48.4 kg)  12/13/15 107 lb 4 oz (48.6 kg)  10/28/15 108 lb 3.2 oz (49.1 kg)    Physical Exam  Constitutional: No distress.  Cardiovascular: Normal rate, regular rhythm and normal heart sounds.   Pulmonary/Chest: Effort normal and breath sounds normal.  Abdominal: Soft. Bowel sounds are normal. She exhibits no distension. There is tenderness (mild suprapubic tenderness). There is no rebound and no guarding.  Musculoskeletal: She exhibits no edema.  No midline spine tenderness, no midline spine step-off, no muscular back tenderness, no overlying skin changes in the area of discomfort  Neurological: She is alert. Gait normal.  5 out of 5 strength bilateral quads, hamstrings, plantar flexion, and dorsiflexion, sensation to light touch intact bilateral lower extremities, 2+ patellar reflexes  Skin: Skin is warm and dry. She is not diaphoretic.     Assessment/Plan: Please see individual problem list.  Dysuria Patient with several day history of dysuria in  combination with frequency, urgency, suprapubic discomfort, and mild low back pain. UA today is unremarkable. No obvious findings on back exam. Neurologically intact in lower extremities. I discussed options for evaluation and management with the patient including pelvic exam, lab work, urine culture, and empiric treatment with antibiotics. Patient declined pelvic exam. We opted for lab work, urine culture, and empiric treatment with antibiotics. Discussed that if she develops any new or changing symptoms she should seek medical attention immediately. She will take a probiotic or eat yogurt with the antibiotic.  Orders Placed This Encounter  Procedures  . Urine Culture  . CBC  . Comp Met (CMET)  . Sed Rate (ESR)  . POCT Urinalysis Dipstick    Meds ordered this encounter  Medications  . cephALEXin (KEFLEX) 500 MG capsule    Sig: Take 1 capsule (500 mg total) by mouth 2 (two) times daily.    Dispense:  14 capsule    Refill:  0     Tommi Rumps, MD [place]

## 2015-12-15 NOTE — Patient Instructions (Signed)
Nice to see you. We are going to proceed with treatment for UTI and send your urine for culture. You should take a probiotic with the antibiotic. We will also check some lab work to determine if there is any further underlying issue causing this. If you develop abdominal pain, vomiting, diarrhea, fevers, worsening back pain, or any new or changing symptoms please seek medical attention.

## 2015-12-16 ENCOUNTER — Other Ambulatory Visit: Payer: Self-pay | Admitting: Family Medicine

## 2015-12-16 DIAGNOSIS — E876 Hypokalemia: Secondary | ICD-10-CM

## 2015-12-16 MED ORDER — POTASSIUM CHLORIDE CRYS ER 20 MEQ PO TBCR: 40.0000 meq | EXTENDED_RELEASE_TABLET | Freq: Every day | ORAL | 0 refills | Status: DC

## 2015-12-17 LAB — URINE CULTURE: Organism ID, Bacteria: NO GROWTH

## 2015-12-21 ENCOUNTER — Other Ambulatory Visit: Payer: Self-pay | Admitting: Internal Medicine

## 2015-12-28 ENCOUNTER — Ambulatory Visit: Payer: Medicare Other

## 2015-12-29 ENCOUNTER — Ambulatory Visit: Payer: Medicare Other | Admitting: Family Medicine

## 2015-12-31 ENCOUNTER — Other Ambulatory Visit: Payer: Self-pay | Admitting: Family Medicine

## 2015-12-31 NOTE — Telephone Encounter (Signed)
Last filled 10/1715. Pt saw Dr. [name] on 12/15/15 for UTI. Pt has appt on 01/10/16 with Dr. [name] to establish care.

## 2015-12-31 NOTE — Telephone Encounter (Signed)
Refill given. Please fax. 

## 2016-01-03 ENCOUNTER — Telehealth: Payer: Self-pay | Admitting: Family Medicine

## 2016-01-03 NOTE — Telephone Encounter (Signed)
Pt called about needing to get her potassium labs done. Need order please and thank you!  Call pt @ [phone].

## 2016-01-03 NOTE — Telephone Encounter (Signed)
Ok. Pt is scheduled. Thank you! °

## 2016-01-03 NOTE — Telephone Encounter (Signed)
Orders are already placed. Can you call and schedule a lab appointment.

## 2016-01-04 ENCOUNTER — Ambulatory Visit: Payer: Medicare Other

## 2016-01-05 ENCOUNTER — Other Ambulatory Visit (INDEPENDENT_AMBULATORY_CARE_PROVIDER_SITE_OTHER): Payer: Medicare Other

## 2016-01-05 ENCOUNTER — Ambulatory Visit (INDEPENDENT_AMBULATORY_CARE_PROVIDER_SITE_OTHER): Payer: Medicare Other

## 2016-01-05 DIAGNOSIS — E538 Deficiency of other specified B group vitamins: Secondary | ICD-10-CM

## 2016-01-05 DIAGNOSIS — E876 Hypokalemia: Secondary | ICD-10-CM

## 2016-01-05 LAB — BASIC METABOLIC PANEL
BUN: 15 mg/dL (ref 6–23)
CO2: 27 mEq/L (ref 19–32)
Calcium: 9.3 mg/dL (ref 8.4–10.5)
Chloride: 104 mEq/L (ref 96–112)
Creatinine, Ser: 1.16 mg/dL (ref 0.40–1.20)
GFR: 49.63 mL/min — ABNORMAL LOW (ref >60.00)
Glucose, Bld: 87 mg/dL (ref 70–99)
Potassium: 3.3 mEq/L — ABNORMAL LOW (ref 3.5–5.1)
Sodium: 141 mEq/L (ref 135–145)

## 2016-01-05 MED ORDER — CYANOCOBALAMIN 1000 MCG/ML IJ SOLN
1000.0000 ug | Freq: Once | INTRAMUSCULAR | Status: AC
  Administered 2016-01-05: 1000 ug via INTRAMUSCULAR

## 2016-01-05 NOTE — Progress Notes (Signed)
Patient came in for  b12 injection.  Received in right deltoid.  Patient tolerated well.

## 2016-01-07 ENCOUNTER — Other Ambulatory Visit: Payer: Self-pay | Admitting: Family Medicine

## 2016-01-07 MED ORDER — POTASSIUM CHLORIDE 20 MEQ/15ML (10%) PO SOLN: 20.0000 meq | Freq: Every day | ORAL | 0 refills | Status: DC

## 2016-01-07 NOTE — Progress Notes (Signed)
I have reviewed the above note and agree.  Carlos Quackenbush, M.D.  

## 2016-01-10 ENCOUNTER — Ambulatory Visit (INDEPENDENT_AMBULATORY_CARE_PROVIDER_SITE_OTHER): Payer: Medicare Other | Admitting: Family Medicine

## 2016-01-10 ENCOUNTER — Encounter: Payer: Self-pay | Admitting: Family Medicine

## 2016-01-10 ENCOUNTER — Other Ambulatory Visit: Payer: Self-pay | Admitting: Surgical

## 2016-01-10 VITALS — BP 110/64 | HR 67 | Temp 97.7°F | Wt 107.4 lb

## 2016-01-10 DIAGNOSIS — E876 Hypokalemia: Secondary | ICD-10-CM | POA: Insufficient documentation

## 2016-01-10 DIAGNOSIS — J8410 Pulmonary fibrosis, unspecified: Secondary | ICD-10-CM

## 2016-01-10 DIAGNOSIS — Z1231 Encounter for screening mammogram for malignant neoplasm of breast: Secondary | ICD-10-CM

## 2016-01-10 DIAGNOSIS — R229 Localized swelling, mass and lump, unspecified: Secondary | ICD-10-CM | POA: Insufficient documentation

## 2016-01-10 DIAGNOSIS — Z1239 Encounter for other screening for malignant neoplasm of breast: Secondary | ICD-10-CM

## 2016-01-10 LAB — BASIC METABOLIC PANEL
BUN: 13 mg/dL (ref 6–23)
CO2: 30 mEq/L (ref 19–32)
Calcium: 9.5 mg/dL (ref 8.4–10.5)
Chloride: 105 mEq/L (ref 96–112)
Creatinine, Ser: 1.13 mg/dL (ref 0.40–1.20)
GFR: 51.15 mL/min — ABNORMAL LOW (ref >60.00)
Glucose, Bld: 93 mg/dL (ref 70–99)
Potassium: 3.2 mEq/L — ABNORMAL LOW (ref 3.5–5.1)
Sodium: 144 mEq/L (ref 135–145)

## 2016-01-10 MED ORDER — PREDNISONE 20 MG PO TABS: 40.0000 mg | ORAL_TABLET | Freq: Every day | ORAL | 0 refills | Status: DC

## 2016-01-10 MED ORDER — MONTELUKAST SODIUM 10 MG PO TABS: 10.0000 mg | ORAL_TABLET | Freq: Every day | ORAL | 1 refills | Status: DC

## 2016-01-10 MED ORDER — PREDNISONE 20 MG PO TABS: 20.0000 mg | ORAL_TABLET | Freq: Every day | ORAL | 0 refills | Status: DC

## 2016-01-10 NOTE — Assessment & Plan Note (Signed)
I'm unsure of the cause of this nodule. It does not appear to be a wart as she was previously advised. We'll refer to dermatology for further evaluation.

## 2016-01-10 NOTE — Progress Notes (Signed)
Pre visit review using our clinic review tool, if applicable. No additional management support is needed unless otherwise documented below in the visit note. 

## 2016-01-10 NOTE — Assessment & Plan Note (Signed)
Persistently mildly low. Suspect related to Lasix. I am unsure she truly needs Lasix given no history of CHF. She more likely has venous insufficiency. We will check a potassium today. If still low we'll have her stop the Lasix and see if fluid reaccumulates. If she does persistently need Lasix she will likely need a daily dose of potassium.

## 2016-01-10 NOTE — Patient Instructions (Signed)
Nice to see you. We are going to start you on prednisone to treat your respiratory complaints. Please call us in 2 days and let us know how you're feeling. If your symptoms have worsened or you have developed new symptoms we'll need to see you in the office. We'll check some lab work today and call with the results.  We are going to refer you to dermatology. If you develop chest pain, fevers breath, cough productive of blood, fevers, or any new or changing symptoms please seek medical attention.

## 2016-01-10 NOTE — Assessment & Plan Note (Signed)
Patient with potential exacerbation of chronic respiratory issue. Mildly decreased breath sounds and wheezing consistent with reactive airway. She's been using her albuterol inhaler. We will treat with prednisone. No focal findings to indicate bacterial illness or need for antibiotics. She'll give [place] a call in 2 days to let us know how she is progressing. If she worsens she will need to be reevaluated.

## 2016-01-10 NOTE — Progress Notes (Signed)
Anne Rumps, MD Phone: [phone]  MARALENE MANFRA is a 65 y.o. female who presents today for follow-up.  Patient notes 2-3 days of upper respiratory symptoms with some mild sinus congestion and postnasal drip. Also notes some wheezing and nonproductive cough. Notes chest congestion. Notes some difficulty breathing when going upstairs though no chest pain or trouble breathing at other times. Has a history of asthma. Typically gets symptoms like this in the fall every year. Has been using her albuterol rescue inhaler with some benefit. No fevers. Typically requires prednisone for this.  Hypokalemia: Patient has persistently been mildly hypokalemic. She completed today 2 of 3 of supplementation yesterday. Takes Lasix daily for lower extremity edema though has no history of CHF. No orthopnea. She also had a question regarding her creatinine which has been slowly creeping up.  Patient also notes a nodule on her right middle toe. It has been there for a long time. Her prior physician told her it was a wart and told her to try duct tape. It has not improved with this. Possibly has been present since she was treated with Levaquin in the past.  PMH: nonsmoker.   ROS see history of present illness  Objective  Physical Exam Vitals:   01/10/16 0915  BP: 110/64  Pulse: 67  Temp: 97.7 F (36.5 C)    BP Readings from Last 3 Encounters:  01/10/16 110/64  12/15/15 114/68  12/13/15 100/60   Wt Readings from Last 3 Encounters:  01/10/16 107 lb 6.4 oz (48.7 kg)  12/15/15 106 lb 9.6 oz (48.4 kg)  12/13/15 107 lb 4 oz (48.6 kg)    Physical Exam  Constitutional: No distress.  HENT:  Head: Normocephalic and atraumatic.  Cardiovascular: Normal rate, regular rhythm and normal heart sounds.   Pulmonary/Chest: Effort normal. No respiratory distress. She has wheezes (minimal scattered expiratory wheezes).  Mildly decreased breath sounds bilaterally  Musculoskeletal: She exhibits no edema.    Neurological: She is alert. Gait normal.  Skin: Skin is warm and dry. She is not diaphoretic.  Erythematous nodule on the right middle toe just proximal to the DIP joint, nontender     Assessment/Plan: Please see individual problem list.  Postinflammatory pulmonary fibrosis ([place]) Patient with potential exacerbation of chronic respiratory issue. Mildly decreased breath sounds and wheezing consistent with reactive airway. She's been using her albuterol inhaler. We will treat with prednisone. No focal findings to indicate bacterial illness or need for antibiotics. She'll give [place] a call in 2 days to let us know how she is progressing. If she worsens she will need to be reevaluated.  Hypokalemia Persistently mildly low. Suspect related to Lasix. I am unsure she truly needs Lasix given no history of CHF. She more likely has venous insufficiency. We will check a potassium today. If still low we'll have her stop the Lasix and see if fluid reaccumulates. If she does persistently need Lasix she will likely need a daily dose of potassium.  Skin nodule I'm unsure of the cause of this nodule. It does not appear to be a wart as she was previously advised. We'll refer to dermatology for further evaluation.   Orders Placed This Encounter  Procedures  . MM SCREENING BREAST TOMO BILATERAL    Standing Status:   Future    Standing Expiration Date:   03/11/2017    Order Specific Question:   Reason for Exam (SYMPTOM  OR DIAGNOSIS REQUIRED)    Answer:   breast cancer screening    Order  Specific Question:   Preferred imaging location?    Answer:   [facility]  . Basic Metabolic Panel (BMET)  . Ambulatory referral to Dermatology    Referral Priority:   Routine    Referral Type:   Consultation    Referral Reason:   Specialty Services Required    Requested Specialty:   Dermatology    Number of Visits Requested:   1    Meds ordered this encounter  Medications  . DISCONTD: predniSONE (DELTASONE) 20  MG tablet    Sig: Take 1 tablet (20 mg total) by mouth daily with breakfast.    Dispense:  10 tablet    Refill:  0  . predniSONE (DELTASONE) 20 MG tablet    Sig: Take 2 tablets (40 mg total) by mouth daily with breakfast.    Dispense:  10 tablet    Refill:  0    Anne Rumps, MD [place]

## 2016-01-10 NOTE — Assessment & Plan Note (Signed)
" >>  ASSESSMENT AND PLAN FOR PULMONARY FIBROSIS ([facility]) WRITTEN ON 01/10/2016  9:56 AM BY Jamal Haskin G, MD  Patient with potential exacerbation of chronic respiratory issue. Mildly decreased breath sounds and wheezing consistent with reactive airway. She's been using her albuterol  inhaler. We will treat with prednisone . No focal findings to indicate bacterial illness or need for antibiotics. She'll give us  a call in 2 days to let us  know how she is progressing. If she worsens she will need to be reevaluated. "

## 2016-01-12 ENCOUNTER — Telehealth: Payer: Self-pay

## 2016-01-12 DIAGNOSIS — E876 Hypokalemia: Secondary | ICD-10-CM

## 2016-01-12 NOTE — Telephone Encounter (Signed)
Pt coming for repeat lab work 01/14/16. Please place future order. Thank you.

## 2016-01-12 NOTE — Telephone Encounter (Signed)
Order place

## 2016-01-14 ENCOUNTER — Other Ambulatory Visit (INDEPENDENT_AMBULATORY_CARE_PROVIDER_SITE_OTHER): Payer: Medicare Other

## 2016-01-14 ENCOUNTER — Encounter: Payer: Self-pay | Admitting: Family Medicine

## 2016-01-14 DIAGNOSIS — E876 Hypokalemia: Secondary | ICD-10-CM

## 2016-01-14 LAB — BASIC METABOLIC PANEL
BUN: 10 mg/dL (ref 6–23)
CO2: 31 mEq/L (ref 19–32)
Calcium: 9.5 mg/dL (ref 8.4–10.5)
Chloride: 105 mEq/L (ref 96–112)
Creatinine, Ser: 1.01 mg/dL (ref 0.40–1.20)
GFR: 58.22 mL/min — ABNORMAL LOW (ref >60.00)
Glucose, Bld: 81 mg/dL (ref 70–99)
Potassium: 3.5 mEq/L (ref 3.5–5.1)
Sodium: 140 mEq/L (ref 135–145)

## 2016-01-17 ENCOUNTER — Ambulatory Visit: Payer: Medicare Other | Admitting: Internal Medicine

## 2016-01-18 ENCOUNTER — Telehealth: Payer: Self-pay | Admitting: Internal Medicine

## 2016-01-18 NOTE — Telephone Encounter (Signed)
Would recommend she try resuming Linzess at 72 mcg daily to determine if this can be tolerated (assuming her symptoms from the weekend have settled down)

## 2016-01-18 NOTE — Telephone Encounter (Signed)
Pt states she has been taking the linzess 72 samples that Dr. Hilarie Fredrickson gave her and she is almost out. States everything was going well until Saturday and that day she had uncontrollable liquid diarrhea. Reports the stool color went from light to a dark color but she did not have cramping. States Sunday she was very bloated and had "lots of inflammation in her hips and back." Pt doesn't know if she should try the linzess again or if Dr. Hilarie Fredrickson has any other recommendations.Please advise.

## 2016-01-19 MED ORDER — LINACLOTIDE 72 MCG PO CAPS: 72.0000 ug | ORAL_CAPSULE | Freq: Every day | ORAL | 0 refills | Status: DC

## 2016-01-19 NOTE — Telephone Encounter (Signed)
Pt aware and samples left up front for pickup.

## 2016-01-21 ENCOUNTER — Telehealth: Payer: Self-pay | Admitting: Family Medicine

## 2016-01-21 ENCOUNTER — Encounter: Payer: Self-pay | Admitting: Internal Medicine

## 2016-01-21 NOTE — Telephone Encounter (Signed)
I called pt and left a vm to schedule AWV. Thank you! °

## 2016-01-21 NOTE — Telephone Encounter (Signed)
error 

## 2016-01-28 ENCOUNTER — Telehealth: Payer: Self-pay | Admitting: Internal Medicine

## 2016-01-28 NOTE — Telephone Encounter (Signed)
Spoke with pt who is requesting prednisone. Pt c/o prod cough with yellow mucus, increased sob & chest tightness X3d Pt has been drinking sinus tea, hycodan & neb treatments with no improvement.  VM please advise. Thanks.

## 2016-01-28 NOTE — Telephone Encounter (Signed)
Pr calling asking if we can call in some prednisone for she has a cold  Dry cough, feels like stuff in her chest Please advise.

## 2016-01-28 NOTE — Telephone Encounter (Signed)
Yes, may take prednisone.   Prednisone 10 mg tabs x 21.  Take 6 tablets on day 1 Take 5 tablets on day 2 Take 4 tablets on day 3 Take 3 tablets on day 4 Take 2 tablets on day 5 Take 1 tablet on day 6 then stop.

## 2016-01-28 NOTE — Telephone Encounter (Signed)
DR please advise to below message. Thanks

## 2016-01-29 ENCOUNTER — Other Ambulatory Visit: Payer: Self-pay | Admitting: Family Medicine

## 2016-01-31 ENCOUNTER — Other Ambulatory Visit: Payer: Self-pay | Admitting: Family Medicine

## 2016-01-31 MED ORDER — PREDNISONE 10 MG PO TABS: ORAL_TABLET | ORAL | 0 refills | Status: DC

## 2016-01-31 NOTE — Telephone Encounter (Signed)
LM for patient to return call to see if patient requested this medications and to see what she needs it for.

## 2016-01-31 NOTE — Telephone Encounter (Signed)
Spoke with patient refill request was to be sent to Dr Joesph July.

## 2016-01-31 NOTE — Telephone Encounter (Signed)
Please advise on refill.

## 2016-01-31 NOTE — Telephone Encounter (Signed)
Pt aware of DR recommendations. Rx sent to preferred pharmacy. Pt voiced understanding and had no further questions. Nothing further needed.

## 2016-01-31 NOTE — Telephone Encounter (Signed)
Noted. Refill refused. Please make sure this was sent to the appropriate person. Thanks.

## 2016-02-01 ENCOUNTER — Other Ambulatory Visit: Payer: Self-pay | Admitting: Family Medicine

## 2016-02-01 NOTE — Telephone Encounter (Signed)
Refill given

## 2016-02-01 NOTE — Telephone Encounter (Signed)
RX faxed

## 2016-02-07 ENCOUNTER — Other Ambulatory Visit: Payer: Self-pay | Admitting: Family Medicine

## 2016-02-07 NOTE — Telephone Encounter (Signed)
Unsure if patient should continue Lasix. Advised at last OV to stop taking to see if it was affecting potassium level. Potassium came back to normal after stopping Lasix.

## 2016-02-07 NOTE — Telephone Encounter (Signed)
Please contact the patient to see if she has continued to take this medication. Thanks.

## 2016-02-08 ENCOUNTER — Ambulatory Visit (INDEPENDENT_AMBULATORY_CARE_PROVIDER_SITE_OTHER): Payer: Medicare Other

## 2016-02-08 DIAGNOSIS — E538 Deficiency of other specified B group vitamins: Secondary | ICD-10-CM

## 2016-02-08 MED ORDER — CYANOCOBALAMIN 1000 MCG/ML IJ SOLN
1000.0000 ug | Freq: Once | INTRAMUSCULAR | Status: AC
  Administered 2016-02-08: 1000 ug via INTRAMUSCULAR

## 2016-02-08 NOTE — Progress Notes (Signed)
Patient comes in for B 12 injection.  Injected left deltoid patient tolerated well.

## 2016-02-09 NOTE — Progress Notes (Signed)
I reviewed the above note and agree. We'll plan on checking her B12 at her next office visit.  Tommi Rumps, M.D.

## 2016-02-10 ENCOUNTER — Ambulatory Visit
Admission: RE | Admit: 2016-02-10 | Discharge: 2016-02-10 | Disposition: A | Discharge status: Home / Self Care | Payer: Medicare Other | Source: Ambulatory Visit | Attending: Family Medicine | Admitting: Family Medicine

## 2016-02-10 DIAGNOSIS — Z1231 Encounter for screening mammogram for malignant neoplasm of breast: Secondary | ICD-10-CM | POA: Diagnosis not present

## 2016-02-10 DIAGNOSIS — Z1239 Encounter for other screening for malignant neoplasm of breast: Secondary | ICD-10-CM

## 2016-02-11 ENCOUNTER — Other Ambulatory Visit: Payer: Self-pay | Admitting: Family Medicine

## 2016-02-11 ENCOUNTER — Encounter: Payer: Self-pay | Admitting: Family Medicine

## 2016-02-11 DIAGNOSIS — N6489 Other specified disorders of breast: Secondary | ICD-10-CM

## 2016-02-14 ENCOUNTER — Ambulatory Visit: Payer: Medicare Other | Admitting: Internal Medicine

## 2016-02-15 NOTE — Telephone Encounter (Signed)
LM for patient to call the office

## 2016-02-16 ENCOUNTER — Other Ambulatory Visit: Payer: Self-pay | Admitting: Family Medicine

## 2016-02-22 ENCOUNTER — Telehealth: Payer: Self-pay | Admitting: Family Medicine

## 2016-02-22 NOTE — Telephone Encounter (Signed)
Pt declined to get a AWV. Thank you! °

## 2016-02-23 ENCOUNTER — Encounter: Payer: Self-pay | Admitting: Internal Medicine

## 2016-02-23 ENCOUNTER — Ambulatory Visit (INDEPENDENT_AMBULATORY_CARE_PROVIDER_SITE_OTHER): Payer: Medicare Other | Admitting: Internal Medicine

## 2016-02-23 VITALS — BP 106/50 | HR 76 | Wt 107.0 lb

## 2016-02-23 DIAGNOSIS — Z930 Tracheostomy status: Secondary | ICD-10-CM

## 2016-02-23 DIAGNOSIS — J8410 Pulmonary fibrosis, unspecified: Secondary | ICD-10-CM | POA: Diagnosis not present

## 2016-02-23 DIAGNOSIS — J0101 Acute recurrent maxillary sinusitis: Secondary | ICD-10-CM

## 2016-02-23 MED ORDER — CLARITHROMYCIN 500 MG PO TABS: 500.0000 mg | ORAL_TABLET | Freq: Two times a day (BID) | ORAL | 0 refills | Status: DC

## 2016-02-23 NOTE — Assessment & Plan Note (Signed)
Acute sinusitis - maxillary  Plan: Biaxin 500mg  -1 tab po BID x 10 days

## 2016-02-23 NOTE — Assessment & Plan Note (Signed)
" >>  ASSESSMENT AND PLAN FOR PULMONARY FIBROSIS ([facility]) WRITTEN ON 02/23/2016 11:58 AM BY Pankaj Haack, MD  She has mild scarring in the apices of her lungs which is due to chronic cough. This is been evaluated extensively with a bronchoscopy with biopsy and workup.  Recently completed another course of prednisone  taper Given her recent exacerbations, which seems to happen more in the colder months, will check a CT chest w/o contrast for pulmonary fibrosis changes.  Plan - supportive care - CT chest w/o contrast for pulmonary fibrosis    "

## 2016-02-23 NOTE — Assessment & Plan Note (Signed)
Per Dr. Lake Bells - the recommendation is that this never be removed. It has literally saved her hospitalizations and possibly intubations because of her vocal cord dysfunction, COPD and asthma. In fact Dr. Lake Bells do not believe that she has COPD at all. She breathes fine even when sick when the tracheostomy has been uncapped.

## 2016-02-23 NOTE — Assessment & Plan Note (Signed)
She has mild scarring in the apices of her lungs which is due to chronic cough. This is been evaluated extensively with a bronchoscopy with biopsy and workup.  Recently completed another course of prednisone taper Given her recent exacerbations, which seems to happen more in the colder months, will check a CT chest w/o contrast for pulmonary fibrosis changes.  Plan - supportive care - CT chest w/o contrast for pulmonary fibrosis

## 2016-02-23 NOTE — Progress Notes (Signed)
Anne [name]      MRN# [id number] Anne [name] 11/19/1950   CC: Chief Complaint  Patient presents with  . Follow-up    prod cough w/yellow mucus; drainage; sore throat; SOB some;       Brief History: Synopsis: She enjoys establish care with the [facility] pulmonary clinic in may of 2014. 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 A CT scan in 2013 performed at [facility] showed some groundglass changes, interstitial thickening, and interlobular thickening in the upper lobes predominantly. This had progressed since 2011. 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 She was also evaluated by [facility] in 2014 and was found to have significant esophageal dysmotility and increased LES tone. She has had a J tube on two separate occasions in the past.  On 07/29/2013 she had lower esophageal Botox injections.   Events since last clinic visit: Patient presents today for a follow-up visit of chronic cough along with history of chronic tracheostomy vocal cord paresis, and nonspecific fibrosis. Since her last visit she has seen her primary care in urgent care about 3 times, the last time was given steroids for a suspected postinflammatory flare (has wheezing and dec breath sounds) also had a prednisone taper called in by our clinic on 01/28/16 (60mg  taper by 10mg  daily).  Take ibuprofen PRN for chronic lower back pain She does have  a history of irritable bowel syndrome, she noticed that when she was on antibiotics for her URIs her symptoms of IBS were improved. Today she has a mild chronic cough that is not worse. No significant Worsening of dyspnea on exertion. She is followed closely with ENT. Overall in stable health and condition.  Today with mild cough and sputum production.     Medication:    Current Outpatient Prescriptions:  .  albuterol (PROVENTIL HFA;VENTOLIN HFA) 108 (90 Base) MCG/ACT inhaler, Inhale 2 puffs into the lungs every 6 (six) hours as needed for wheezing or shortness of breath., Disp: 1 Inhaler, Rfl: 5 .  ALPRAZolam (XANAX) 0.25 MG tablet, TAKE 1/2 TABLET BY MOUTH 2 TIMES DAILY AS NEEDED, Disp: 30 tablet, Rfl: 2 .  cephALEXin (KEFLEX) 500 MG capsule, Take 1 capsule (500 mg total) by mouth 2 (two) times daily., Disp: 14 capsule, Rfl: 0 .  cetirizine (ZYRTEC) 10 MG tablet, TAKE 1 TABLET (10 MG TOTAL) BY MOUTH DAILY., Disp: 30 tablet, Rfl: 11 .  clopidogrel (PLAVIX) 75 MG tablet, TAKE 1 TABLET BY MOUTH IN THE MORNING, Disp: 90 tablet, Rfl: 1 .  cyanocobalamin (,VITAMIN B-12,) 1000 MCG/ML injection, Inject 1 mL (1,000 mcg total) into the muscle once., Disp: 1 mL, Rfl: 0 .  EPINEPHrine (EPIPEN 2-PAK) 0.3 mg/0.3 mL IJ SOAJ injection, Inject 0.3 mLs (0.3 mg total) into the muscle once., Disp: 1 Device, Rfl: 0 .  estradiol (ESTRACE) 1 MG tablet, Take 1 tablet (1 mg total) by mouth daily., Disp: 90 tablet, Rfl: 3 .  fluticasone (FLONASE) 50 MCG/ACT nasal spray, Place 2 sprays  into both nostrils daily as needed for allergies. , Disp: , Rfl:  .  fluticasone (FLONASE) 50 MCG/ACT nasal spray, PLACE 2 SPRAYS INTO BOTH NOSTRILS DAILY., Disp: 16 g, Rfl: 2 .  furosemide (LASIX) 20 MG tablet, TAKE 1 TABLET BY MOUTH ONCE A DAY AS NEEDED, Disp: 90 tablet, Rfl: 1 .  hydroxypropyl methylcellulose (ISOPTO TEARS) 2.5 % ophthalmic solution, Place 1 drop into both eyes 3 (three) times daily as needed for dry eyes., Disp: ,  Rfl:  .  ibuprofen (ADVIL,MOTRIN) 200 MG tablet, Take 400-600 mg by mouth every 6 (six) hours as needed for mild pain or moderate pain., Disp: , Rfl:  .  ipratropium-albuterol (DUONEB) 0.5-2.5 (3) MG/3ML SOLN, Take 3 mLs by nebulization every 4 (four) hours as needed. Dx 496 (Patient taking differently: Take 3 mLs by nebulization every 4 (four) hours as needed (difficulty breathing). ), Disp: 360 mL, Rfl: 2 .  linaclotide (LINZESS) 72 MCG capsule, Take 1 capsule (72 mcg total) by mouth daily before breakfast., Disp: 16 capsule, Rfl: 0 .  montelukast (SINGULAIR) 10 MG tablet, Take 1 tablet (10 mg total) by mouth daily., Disp: 90 tablet, Rfl: 1 .  mupirocin ointment (BACTROBAN) 2 %, Apply 1 application topically daily. To keep trach site from getting irritated, Disp: , Rfl:  .  pantoprazole (PROTONIX) 40 MG tablet, Take 1 tablet (40 mg total) by mouth daily., Disp: 90 tablet, Rfl: 3 .  potassium chloride 20 MEQ/15ML (10%) SOLN, Take 15 mLs (20 mEq total) by mouth daily., Disp: 45 mL, Rfl: 0 .  potassium chloride SA (K-DUR,KLOR-CON) 20 MEQ tablet, Take 2 tablets (40 mEq total) by mouth daily., Disp: 6 tablet, Rfl: 0 .  predniSONE (DELTASONE) 10 MG tablet, 6 tablets X 1 day then 5,4,3,2,1 then stop, Disp: 21 tablet, Rfl: 0 .  predniSONE (DELTASONE) 20 MG tablet, Take 2 tablets (40 mg total) by mouth daily with breakfast., Disp: 10 tablet, Rfl: 0 .  Probiotic Product (PROBIOTIC PO), Take 1 tablet by mouth daily., Disp: , Rfl:  .  sodium chloride (OCEAN) 0.65 % SOLN nasal spray, Place 1 spray into both nostrils every 4 (four) hours as needed for congestion. , Disp: , Rfl:  .  zolmitriptan (ZOMIG ZMT) 5 MG disintegrating tablet, Take 1 tablet (5 mg total) by mouth as needed for migraine., Disp: 10 tablet, Rfl: 0    Review of Systems  Constitutional: Negative for chills, fever, malaise/fatigue and weight loss.  HENT: Positive for congestion and sinus pain.   Eyes: Negative for blurred vision.    Respiratory: Positive for sputum production and wheezing. Negative for shortness of breath.        Mild chronic cough   Cardiovascular: Negative for chest pain and palpitations.  Gastrointestinal: Negative for heartburn and nausea.  Genitourinary: Negative for dysuria.  Musculoskeletal: Negative for myalgias.  Skin: Negative for rash.  Endo/Heme/Allergies: Does not bruise/bleed easily.      Allergies:  Influenza vaccines; Shellfish allergy; Quinolones; Asa [aspirin]; Ciprofloxacin; Levaquin [levofloxacin in d5w]; Septra [sulfamethoxazole-trimethoprim]; and Eggs or egg-derived products  Physical Examination:  VS: BP (!) 106/50 (BP Location: Left Arm, Cuff Size: Normal)   Pulse 76   Wt 107 lb (48.5 kg)   SpO2 100%   BMI 18.95 kg/m   General Appearance: No distress  HEENT: PERRLA, no ptosis, no other lesions noticed, maxillary sinus pressure noted along with drainage.  Pulmonary:normal breath sounds., diaphragmatic excursion normal.No wheezing, No rales   Cardiovascular:  Normal S1,S2.  No m/r/g.  Abdomen:Exam: Benign, Soft, non-tender, No masses  Skin:   warm, no rashes, no ecchymosis  Extremities: normal, no cyanosis, clubbing, warm with normal capillary refill.      Assessment and Plan: Tracheostomy dependent [facility]) Per Dr. Lake Bells - the recommendation is that this never be removed. It has literally saved her hospitalizations and possibly intubations because of her vocal cord dysfunction, COPD and asthma. In fact Dr. Lake Bells do not believe that she has COPD at all. She breathes fine even when sick when the tracheostomy has been uncapped.      Postinflammatory pulmonary fibrosis (HCC) She has mild scarring in the apices of her lungs which is due to chronic cough. This is been evaluated extensively with a bronchoscopy with biopsy and workup.  Recently completed another course of prednisone taper Given her recent exacerbations, which seems to happen more in the colder  months, will check a CT chest w/o contrast for pulmonary fibrosis changes.  Plan - supportive care - CT chest w/o contrast for pulmonary fibrosis     Sinusitis, acute maxillary Acute sinusitis - maxillary  Plan: Biaxin 500mg  -1 tab po BID x 10 days   Updated Medication List Outpatient Encounter Prescriptions as of 02/23/2016  Medication Sig  . albuterol (PROVENTIL HFA;VENTOLIN HFA) 108 (90 Base) MCG/ACT inhaler Inhale 2 puffs into the lungs every 6 (six) hours as needed for wheezing or shortness of breath.  . ALPRAZolam (XANAX) 0.25 MG tablet TAKE 1/2 TABLET BY MOUTH 2 TIMES DAILY AS NEEDED  . cephALEXin (KEFLEX) 500 MG capsule Take 1 capsule (500 mg total) by mouth 2 (two) times daily.  . cetirizine (ZYRTEC) 10 MG tablet TAKE 1 TABLET (10 MG TOTAL) BY MOUTH DAILY.  [name] clopidogrel (PLAVIX) 75 MG tablet TAKE 1 TABLET BY MOUTH IN THE MORNING  . cyanocobalamin (,VITAMIN B-12,) 1000 MCG/ML injection Inject 1 mL (1,000 mcg total) into the muscle once.  [name] EPINEPHrine (EPIPEN 2-PAK) 0.3 mg/0.3 mL IJ SOAJ injection Inject 0.3 mLs (0.3 mg total) into the muscle once.  [name] estradiol (ESTRACE) 1 MG tablet Take 1 tablet (1 mg total) by mouth daily.  . fluticasone (FLONASE) 50 MCG/ACT nasal spray Place 2 sprays into both nostrils daily as needed for allergies.   . fluticasone (FLONASE) 50 MCG/ACT nasal spray PLACE 2 SPRAYS INTO BOTH NOSTRILS DAILY.  . furosemide (LASIX) 20 MG tablet TAKE 1 TABLET BY MOUTH ONCE A DAY AS NEEDED  . hydroxypropyl methylcellulose (ISOPTO TEARS) 2.5 % ophthalmic solution Place 1 drop into both eyes 3 (three) times daily as needed for dry eyes.  [name] ibuprofen (ADVIL,MOTRIN) 200 MG tablet Take 400-600 mg by mouth every 6 (six) hours as needed for mild pain or moderate pain.  [name] ipratropium-albuterol (DUONEB) 0.5-2.5 (3) MG/3ML SOLN Take 3 mLs by nebulization every 4 (four) hours as needed. Dx 496 (Patient taking differently: Take 3 mLs by nebulization every 4 (four) hours as  needed (difficulty breathing). )  . linaclotide (LINZESS) 72 MCG capsule Take 1 capsule (72 mcg total) by mouth daily before breakfast.  . montelukast (SINGULAIR) 10 MG tablet Take 1 tablet (10 mg total) by mouth daily.  . mupirocin ointment (BACTROBAN) 2 % Apply 1 application topically daily. To keep trach site from getting irritated  . pantoprazole (PROTONIX) 40 MG tablet Take 1 tablet (40 mg total) by mouth daily.  . potassium chloride 20 MEQ/15ML (10%) SOLN Take 15 mLs (20 mEq total) by mouth daily.  . potassium chloride SA (K-DUR,KLOR-CON) 20 MEQ tablet Take 2 tablets (  40 mEq total) by mouth daily.  . predniSONE (DELTASONE) 10 MG tablet 6 tablets X 1 day then 5,4,3,2,1 then stop  . predniSONE (DELTASONE) 20 MG tablet Take 2 tablets (40 mg total) by mouth daily with breakfast.  . Probiotic Product (PROBIOTIC PO) Take 1 tablet by mouth daily.  . sodium chloride (OCEAN) 0.65 % SOLN nasal spray Place 1 spray into both nostrils every 4 (four) hours as needed for congestion.   [name] zolmitriptan (ZOMIG ZMT) 5 MG disintegrating tablet Take 1 tablet (5 mg total) by mouth as needed for migraine.   No facility-administered encounter medications on file as of 02/23/2016.     Orders for this visit: No orders of the defined types were placed in this encounter.   Thank  you for the visitation and for allowing  [facility] to assist in the care of your patient. Our recommendations are noted above.  Please contact us if we can be of further service.  Vilinda Boehringer, MD [facility] Office Number: [phone]  Note: This note was prepared with Dragon dictation along with smaller phrase technology. Any transcriptional errors that result from this process are unintentional.

## 2016-02-23 NOTE — Patient Instructions (Addendum)
Follow up with Dr. Alva Garnet in 6 weeks - Biaxin 500mg  -1 tab po BID x 10 days - CT chest w/o contrast for pulmonary fibrosis changes, prior to follow up visit.  - continue with allergy control .

## 2016-02-25 ENCOUNTER — Telehealth: Payer: Self-pay | Admitting: *Deleted

## 2016-02-25 MED ORDER — PREDNISONE 20 MG PO TABS: 20.0000 mg | ORAL_TABLET | Freq: Every day | ORAL | 0 refills | Status: DC

## 2016-02-25 NOTE — Telephone Encounter (Signed)
Prednisone 20 mg daily for 7 days 

## 2016-02-25 NOTE — Telephone Encounter (Signed)
Pt informed Prednisone has been sent to the pharmacy. Nothing further needed.

## 2016-02-25 NOTE — Telephone Encounter (Signed)
Pt seen Mungal on Wednesday and he gave her Biaxin 500mg  1 BID x 10 days.  Pt is asking if we can give her some Prednisone. Please advise. HX of postinflammatory pulmonary fibrosis.

## 2016-02-28 ENCOUNTER — Telehealth: Payer: Self-pay | Admitting: Internal Medicine

## 2016-02-28 MED ORDER — AZITHROMYCIN 200 MG/5ML PO SUSR: 500.0000 mg | Freq: Every day | ORAL | 0 refills | Status: AC

## 2016-02-28 NOTE — Telephone Encounter (Signed)
Prescription has been entered as requested  [name]

## 2016-02-28 NOTE — Telephone Encounter (Signed)
Pt was prescribed biaxin 500mg  X 10 d on 02/23/16 and prednisone on 02/25/16. Pt states she feels the prednisone is helping, however the biaxin she feels is not working. Pt c/o head congestion & prod cough with white mucus. She is requesting that we send in zithromax liquid as she knows this has worked previously.  DS please advise. Thanks.

## 2016-02-28 NOTE — Telephone Encounter (Signed)
Pt calling stating the prednisone has helped with her infection. She is asking for a liquid Zithromax  She is having issues with pills Please advise.

## 2016-02-28 NOTE — Telephone Encounter (Signed)
Pt aware of DS recommendations. Pt voiced understanding and had no further questions. Nothing further needed.

## 2016-03-01 ENCOUNTER — Ambulatory Visit
Admission: RE | Admit: 2016-03-01 | Discharge: 2016-03-01 | Disposition: A | Discharge status: Home / Self Care | Payer: Medicare Other | Source: Ambulatory Visit | Attending: Internal Medicine | Admitting: Internal Medicine

## 2016-03-01 DIAGNOSIS — R918 Other nonspecific abnormal finding of lung field: Secondary | ICD-10-CM | POA: Insufficient documentation

## 2016-03-01 DIAGNOSIS — Z9049 Acquired absence of other specified parts of digestive tract: Secondary | ICD-10-CM | POA: Insufficient documentation

## 2016-03-01 DIAGNOSIS — J479 Bronchiectasis, uncomplicated: Secondary | ICD-10-CM | POA: Insufficient documentation

## 2016-03-01 DIAGNOSIS — Z930 Tracheostomy status: Secondary | ICD-10-CM | POA: Insufficient documentation

## 2016-03-01 DIAGNOSIS — M47814 Spondylosis without myelopathy or radiculopathy, thoracic region: Secondary | ICD-10-CM | POA: Insufficient documentation

## 2016-03-01 DIAGNOSIS — J8410 Pulmonary fibrosis, unspecified: Secondary | ICD-10-CM | POA: Diagnosis not present

## 2016-03-07 ENCOUNTER — Ambulatory Visit
Admission: RE | Admit: 2016-03-07 | Discharge: 2016-03-07 | Disposition: A | Discharge status: Home / Self Care | Payer: Medicare Other | Source: Ambulatory Visit | Attending: Family Medicine | Admitting: Family Medicine

## 2016-03-07 ENCOUNTER — Encounter: Payer: Self-pay | Admitting: Internal Medicine

## 2016-03-07 DIAGNOSIS — R928 Other abnormal and inconclusive findings on diagnostic imaging of breast: Secondary | ICD-10-CM | POA: Diagnosis not present

## 2016-03-07 DIAGNOSIS — N6489 Other specified disorders of breast: Secondary | ICD-10-CM

## 2016-03-08 ENCOUNTER — Other Ambulatory Visit: Payer: Self-pay | Admitting: Family Medicine

## 2016-03-08 ENCOUNTER — Telehealth: Payer: Self-pay | Admitting: Internal Medicine

## 2016-03-08 MED ORDER — PREDNISONE 20 MG PO TABS: 20.0000 mg | ORAL_TABLET | Freq: Every day | ORAL | 0 refills | Status: DC

## 2016-03-08 NOTE — Telephone Encounter (Signed)
Lmtcb. Will await call back 

## 2016-03-08 NOTE — Telephone Encounter (Signed)
Left message to return call to verify if patient is still taking

## 2016-03-08 NOTE — Telephone Encounter (Signed)
Sent to pharmacy 

## 2016-03-08 NOTE — Telephone Encounter (Signed)
Patient called and is still feeling congested and having a hard time breathing and she is needing more prednisone. She is wanting her ct results. Please call patient.

## 2016-03-08 NOTE — Telephone Encounter (Signed)
Rx sent to preferred pharmacy. Pt aware and voiced understanding. Pt states she is scheduled for f/u with DS on 04/01/15 and will discuss CT results then. Nothing further needed.

## 2016-03-08 NOTE — Telephone Encounter (Signed)
Pt was prescribed zithromax on 02/28/16. Pt she did noticed some improvement while on abx. Last dose of abx was 5 days ago, since being off of the abx pt states symptoms have returned. Pt c/o increased sob, chest tightness, post nasal drip & wheezing. Pt is requesting prednisone. Pt would also like her CT 03/01/16

## 2016-03-08 NOTE — Telephone Encounter (Signed)
Prednisone 20 mg daily for 7 days Needs to follow up with provider for CT results

## 2016-03-08 NOTE — Telephone Encounter (Signed)
Patient states she is still taking this every day, last filled 08/09/15 90 1rf

## 2016-03-09 ENCOUNTER — Ambulatory Visit: Payer: Medicare Other | Admitting: Internal Medicine

## 2016-03-09 ENCOUNTER — Ambulatory Visit: Payer: Medicare Other

## 2016-03-09 ENCOUNTER — Ambulatory Visit (INDEPENDENT_AMBULATORY_CARE_PROVIDER_SITE_OTHER): Payer: Medicare Other

## 2016-03-09 DIAGNOSIS — E538 Deficiency of other specified B group vitamins: Secondary | ICD-10-CM | POA: Diagnosis not present

## 2016-03-09 MED ORDER — CYANOCOBALAMIN 1000 MCG/ML IJ SOLN
1000.0000 ug | Freq: Once | INTRAMUSCULAR | Status: AC
  Administered 2016-03-09: 1000 ug via INTRAMUSCULAR

## 2016-03-09 NOTE — Telephone Encounter (Signed)
DS please advise on below message. Pt has an upcoming appointment with you on 04/01/15. Thanks.

## 2016-03-09 NOTE — Progress Notes (Signed)
Patient comes in for B 12 injection.  Injected right deltoid.  Patient tolerated injection well.   

## 2016-03-12 NOTE — Progress Notes (Signed)
I have reviewed the above note and agree.  Nicholai Willette, M.D.  

## 2016-03-13 ENCOUNTER — Telehealth: Payer: Self-pay | Admitting: Pulmonary Disease

## 2016-03-13 MED ORDER — AMOXICILLIN 500 MG PO TABS: 500.0000 mg | ORAL_TABLET | Freq: Two times a day (BID) | ORAL | 0 refills | Status: DC

## 2016-03-13 NOTE — Telephone Encounter (Signed)
Spoke with the patient. Chest tightness better since rx with prednisone. She could not tolerate biaxin. Mucous is yellowish. She believes that she needs another abx for her purulent mucous. She tells me that she has tolerated amoxicillin before, will try this >   Call in Amoxicillin 500mg  po bid x 7 days, disp # 14, no RF

## 2016-03-13 NOTE — Telephone Encounter (Signed)
Pt calling back stating she needs someone to call back.

## 2016-03-13 NOTE — Telephone Encounter (Signed)
rx for the amox has been sent to the pharmacy. Nothing further is needed.

## 2016-03-13 NOTE — Telephone Encounter (Signed)
Pt calling asking if we can please advise on something she can take something to help with her congestion She thinks it she can get it to "Dry" up it would help her a lot Please advise

## 2016-03-13 NOTE — Telephone Encounter (Signed)
Called and spoke with pt and she stated that she was seen by Dr. Stevenson Clinch in [place] back on 11/29 and was given prednisone and biaxin.  She stated that the prednisone helped with her breathing, but she is needing something to help clear up the congestion--she stated that this is yellow in color.  This is going on her 4 th week of being sick and she stated that this is all related to the congestion---Dr. Stevenson Clinch is not aval today---will forward to DOD to address.  Please advise RB---  Allergies  Allergen Reactions  . Influenza Vaccines Shortness Of Breath    Also causes her to have achy, flu-like symptoms  . Shellfish Allergy Anaphylaxis  . Quinolones     Feet swell  . Asa [Aspirin]     Tongue swelling  . Ciprofloxacin Swelling    ALL OF THE "FLOXACIN"  . Levaquin [Levofloxacin In D5w]     Aches and swelling  . Septra [Sulfamethoxazole-Trimethoprim] Diarrhea  . Eggs Or Egg-Derived Products Rash    GI Upset

## 2016-03-27 DIAGNOSIS — N289 Disorder of kidney and ureter, unspecified: Secondary | ICD-10-CM

## 2016-03-27 HISTORY — DX: Disorder of kidney and ureter, unspecified: N28.9

## 2016-03-31 ENCOUNTER — Ambulatory Visit (INDEPENDENT_AMBULATORY_CARE_PROVIDER_SITE_OTHER): Payer: Medicare Other | Admitting: Pulmonary Disease

## 2016-03-31 ENCOUNTER — Encounter: Payer: Self-pay | Admitting: Pulmonary Disease

## 2016-03-31 VITALS — BP 118/68 | HR 70 | Wt 107.0 lb

## 2016-03-31 DIAGNOSIS — J8410 Pulmonary fibrosis, unspecified: Secondary | ICD-10-CM | POA: Diagnosis not present

## 2016-03-31 DIAGNOSIS — J40 Bronchitis, not specified as acute or chronic: Secondary | ICD-10-CM

## 2016-03-31 DIAGNOSIS — J45909 Unspecified asthma, uncomplicated: Secondary | ICD-10-CM | POA: Diagnosis not present

## 2016-03-31 DIAGNOSIS — Z930 Tracheostomy status: Secondary | ICD-10-CM

## 2016-03-31 MED ORDER — AZITHROMYCIN 250 MG PO TABS: ORAL_TABLET | ORAL | 0 refills | Status: AC

## 2016-03-31 NOTE — Patient Instructions (Signed)
1) Zpak prescribed  2) When Z pak is completed, you may try off of Singulair (montelukast) monitoring its effect on your breathing and upper respiratory/nasal symptoms  3) Follow up in 3 months

## 2016-04-06 NOTE — Progress Notes (Signed)
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 No frank honeycombing. Findings have progressed since  2014. 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  INTERVAL HISTORY: Last seen by VM 02/23/16. No major events.  SUBJ: Her to review results of recent CT chest. No new complaints. Reports occasional SOB/DOE - mild. She reports chest congestion which is chronic but recently has become discolored - yellow/green. Denies CP, fever, hemoptysis, LE edema and calf tenderness   OBJ: Vitals:   03/31/16 0943  BP: 118/68  Pulse: 70  SpO2: 100%  Weight: 107 lb (48.5 kg)   WDWN, NAD HEENT WNL No JVD, LAN. Trach site clean  No wheezes or other adventitious sounds Reg, no M NABS No C/C/E   DATA: CT chest reviewed as above  IMPRESSION: 1) Asthma - well controlled. 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 There are times that she does not tolerate capping but it remains capped most of the time 4) Acute purulent tracheobronchitis - mild  PLAN: 1) Zpak prescribed 2) Continue current medical regimen and trach tube care 3) Consider trial off of Singulair to assess if it is making any difference in her asthma control 4) Follow up in 3 months   Merton Border, MD PCCM service Mobile [phone] Pager [phone] 04/06/2016

## 2016-04-11 ENCOUNTER — Ambulatory Visit (INDEPENDENT_AMBULATORY_CARE_PROVIDER_SITE_OTHER): Payer: Medicare Other

## 2016-04-11 DIAGNOSIS — E538 Deficiency of other specified B group vitamins: Secondary | ICD-10-CM

## 2016-04-11 MED ORDER — CYANOCOBALAMIN 1000 MCG/ML IJ SOLN
1000.0000 ug | Freq: Once | INTRAMUSCULAR | Status: AC
  Administered 2016-04-11: 1000 ug via INTRAMUSCULAR

## 2016-04-11 NOTE — Progress Notes (Signed)
Patient comes in for B 12 injection.  Injected left deltoid.  Patient tolerated injection well.  

## 2016-04-12 NOTE — Progress Notes (Signed)
I reviewed the above note and agree.  Kristianna Saperstein, M.D. 

## 2016-04-18 ENCOUNTER — Other Ambulatory Visit: Payer: Self-pay | Admitting: Internal Medicine

## 2016-04-27 ENCOUNTER — Other Ambulatory Visit: Payer: Self-pay

## 2016-04-27 MED ORDER — ALPRAZOLAM 0.25 MG PO TABS: ORAL_TABLET | ORAL | 2 refills | Status: DC

## 2016-04-27 NOTE — Telephone Encounter (Signed)
Last OV 01/10/16 last filled 02/01/16 30 2rf

## 2016-04-27 NOTE — Telephone Encounter (Signed)
[facility] 

## 2016-05-02 ENCOUNTER — Other Ambulatory Visit: Payer: Self-pay

## 2016-05-02 MED ORDER — MONTELUKAST SODIUM 10 MG PO TABS
10.0000 mg | ORAL_TABLET | Freq: Every day | ORAL | 1 refills | Status: DC
Start: 2016-05-02 — End: 2016-06-19

## 2016-05-02 NOTE — Progress Notes (Signed)
[facility] to [facility] 

## 2016-05-04 ENCOUNTER — Encounter: Payer: Self-pay | Admitting: Internal Medicine

## 2016-05-04 ENCOUNTER — Ambulatory Visit (INDEPENDENT_AMBULATORY_CARE_PROVIDER_SITE_OTHER): Payer: Medicare Other | Admitting: Internal Medicine

## 2016-05-04 VITALS — BP 104/60 | HR 72 | Ht 62.6 in | Wt 107.4 lb

## 2016-05-04 DIAGNOSIS — K581 Irritable bowel syndrome with constipation: Secondary | ICD-10-CM

## 2016-05-04 DIAGNOSIS — K224 Dyskinesia of esophagus: Secondary | ICD-10-CM

## 2016-05-04 DIAGNOSIS — K220 Achalasia of cardia: Secondary | ICD-10-CM

## 2016-05-04 DIAGNOSIS — K2970 Gastritis, unspecified, without bleeding: Secondary | ICD-10-CM

## 2016-05-04 NOTE — Patient Instructions (Signed)
Continue Pantoprazole.  Amitiza 38mcg twice daily. If this does not work well, we may increase your dosage. Call and let us know how the samples we gave you of the 8 mcg work.  Please follow up with Dr Hilarie Fredrickson in 6 months.  If you are age 67 or older, your body mass index should be between 23-30. Your Body mass index is 19.26 kg/m. If this is out of the aforementioned range listed, please consider follow up with your Primary Care Provider.  If you are age 75 or younger, your body mass index should be between 19-25. Your Body mass index is 19.26 kg/m. If this is out of the aformentioned range listed, please consider follow up with your Primary Care Provider.

## 2016-05-04 NOTE — Progress Notes (Signed)
Subjective:    Patient ID: Anne [name], female    DOB: May 09, 1950, 66 y.o.   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 She also has history of pulmonary fibrosis, vocal cord dysfunction, permanent tracheostomy and history of TIA and migraines. She is here alone today and was last seen in the office on 12/13/2015.  We reduce Linzess to 72 g daily due to diarrhea associated with the 145 g dose. She still unable to take this daily due to the fact that she has loose stools which lasts several hours and limit her activities. She did try the drug for 10 straight days and felt like it was "too much". She is using it every 3-4 days for constipation, abdominal pain and bloating. When she takes it she has to remain in her house until after lunch but it does help. She feels significantly better using this medicine 3-4 days per week then previous attempts at [facility] and other laxatives.  She is taking ibuprofen 800 mg twice daily for her cervical neck pain and joint pains. Pantoprazole is also being used at 40 mg daily which helps with her reflux and epigastric burning discomfort. No black stool, blood in her stool.  Weight is been stable. She is [place] smaller more frequent meals. She's had some issues with recurrent dysphagia which gets better after Botox but slowly returns. She's had some regurgitation after eating.  Her last colonoscopy was in August 2010 performed in [place], [place] which was normal. Denies family history of colon cancer  Review of Systems As per HPI, otherwise negative  Current Medications, Allergies, Past Medical History, Past Surgical History, Family History and Social History were reviewed in [employer] record.     Objective:   Physical Exam BP 104/60 (BP Location: Left  Arm, Patient Position: Sitting, Cuff Size: Normal)   Pulse 72   Ht 5' 2.6" (1.59 m)   Wt 107 lb 6 oz (48.7 kg)   BMI 19.26 kg/m  Constitutional: Well-developed and well-nourished. No distress. HEENT: Normocephalic and atraumatic. Oropharynx is clear and moist. No oropharyngeal exudate. Conjunctivae are normal.  No scleral icterus. Neck: Neck supple. Trachea midline. Stoma intact Cardiovascular: Normal rate, regular rhythm and intact distal pulses.  Pulmonary/chest: Effort normal and breath sounds normal. No wheezing, rales or rhonchi. Abdominal: Soft, Diffuse tenderness without rebound or guarding, nondistended. Bowel sounds active throughout. There are no masses palpable. No hepatosplenomegaly. Extremities: no clubbing, cyanosis, or edema Neurological: Alert and oriented to person place and time. Skin: Skin is warm and dry.  Psychiatric: Normal mood and affect. Behavior is normal.  CBC    Component Value Date/Time   WBC 5.5 12/15/2015 1009   RBC 4.19 12/15/2015 1009   HGB 13.3 12/15/2015 1009   HGB 12.6 02/17/2014 1303   HCT 38.8 12/15/2015 1009   HCT 37.9 02/17/2014 1303   PLT 239.0 12/15/2015 1009   PLT 208 02/17/2014 1303   MCV 92.6 12/15/2015 1009   MCV 97 02/17/2014 1303   MCH 32.3 02/17/2014 1303   MCHC 34.3 12/15/2015 1009   RDW 13.1 12/15/2015 1009   RDW 12.5 02/17/2014 1303   LYMPHSABS 1.4 10/28/2015 0923   LYMPHSABS 1.2 02/17/2014 1303   MONOABS 0.5 10/28/2015 0923   MONOABS 0.3 02/17/2014 1303   EOSABS 0.2 10/28/2015 0923   EOSABS 0.2 02/17/2014 1303  BASOSABS 0.0 10/28/2015 0923   BASOSABS 0.0 02/17/2014 1303   CMP     Component Value Date/Time   NA 140 01/14/2016 1022   K 3.5 01/14/2016 1022   K 3.6 04/03/2012 1653   CL 105 01/14/2016 1022   CO2 31 01/14/2016 1022   GLUCOSE 81 01/14/2016 1022   BUN 10 01/14/2016 1022   CREATININE 1.01 01/14/2016 1022   CALCIUM 9.5 01/14/2016 1022   PROT 7.1 12/15/2015 1009   ALBUMIN 4.2 12/15/2015 1009   AST 21  12/15/2015 1009   ALT 14 12/15/2015 1009   ALKPHOS 80 12/15/2015 1009   BILITOT 0.7 12/15/2015 1009       Assessment & Plan:  66 year old female with history of esophageal dysphagia, 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  1. Chronic constipation/irritable bowel with lower abdominal pain -- Linzess has helped but due to loose stools and urgency she cannot tolerate this daily. For this reason I'm going to have her try Amitiza 8 g twice a day. Can increase to 24 g twice a day if ineffective. With this trial we can determine whether Linzess or Amitiza works better for her. I would rather her have a daily affective medication rather than using it when symptoms worsen.  2. Gastritis/epigastric pain/NSAIDs -- PPIs help the symptoms. Her frequent ibuprofen use is certainly a risk factor. I recommended she continue PPI uninterrupted in the setting of NSAIDs and Plavix use and her history of gastritis. Continue pantoprazole 40 mg once daily before breakfast  3. Esophageal dysmotility/hypertensive LES -- Botox responsive. History of 2 Botox injections to the LES resulting in significant improvement in dysphagia. I will asked Dr. Silverio Decamp for her opinion regarding the possibility of pneumatic dilation given response to Botox injection.   4. CRC screening -- colonoscopy due in 2 years, 2020  25 minutes spent with the patient today. Greater than 50% was spent in counseling and coordination of care with the patient 6-8 month followup

## 2016-05-11 ENCOUNTER — Telehealth: Payer: Self-pay | Admitting: *Deleted

## 2016-05-11 ENCOUNTER — Other Ambulatory Visit: Payer: Self-pay | Admitting: Family Medicine

## 2016-05-11 MED ORDER — POTASSIUM CHLORIDE CRYS ER 20 MEQ PO TBCR: 20.0000 meq | EXTENDED_RELEASE_TABLET | Freq: Every day | ORAL | 3 refills | Status: DC | PRN

## 2016-05-11 NOTE — Telephone Encounter (Signed)
Sent to pharmacy. She should take this when she takes her Lasix though should try to avoid taking it if she is not taking her Lasix.

## 2016-05-11 NOTE — Telephone Encounter (Signed)
Call pharmacy - have them change to liquid.

## 2016-05-11 NOTE — Telephone Encounter (Signed)
Please advise, I do not see med in chart to pend refill

## 2016-05-11 NOTE — Telephone Encounter (Signed)
Please advise if this can be done  

## 2016-05-11 NOTE — Telephone Encounter (Signed)
Patent states she is unable to swallow tablets and would like liquid potassium

## 2016-05-11 NOTE — Telephone Encounter (Signed)
Pt requested to have another physician complete this Rx so that she could receive this today

## 2016-05-11 NOTE — Telephone Encounter (Signed)
Patient states she is on lasix everyday but only takes potassium when she needs it and starts having leg or hand cramps or a HA

## 2016-05-11 NOTE — Telephone Encounter (Signed)
Pharmacy called and asked to switch medication to a liquid they agreed to do so.

## 2016-05-11 NOTE — Telephone Encounter (Signed)
Pt has requested a Rx for potassium, pt stated that she has a Hx of low potassium, pt would like to keep a maintenance dosage of potassium for her leg cramps .   Pt pharmacy [facility]

## 2016-05-12 MED ORDER — POTASSIUM CHLORIDE 20 MEQ/15ML (10%) PO SOLN: 20.0000 meq | Freq: Every day | ORAL | 0 refills | Status: DC | PRN

## 2016-05-12 NOTE — Telephone Encounter (Signed)
Please send in rx.  

## 2016-05-12 NOTE — Addendum Note (Signed)
Addended by: [name] on: 05/12/2016 04:06 PM   Modules accepted: Orders

## 2016-05-12 NOTE — Telephone Encounter (Signed)
Pt stated pharmacy did not receive the Rx of the liquid form of this medication  Pt contact [phone]

## 2016-05-12 NOTE — Telephone Encounter (Signed)
Sent to pharmacy 

## 2016-05-16 ENCOUNTER — Ambulatory Visit (INDEPENDENT_AMBULATORY_CARE_PROVIDER_SITE_OTHER): Payer: Medicare Other

## 2016-05-16 DIAGNOSIS — E538 Deficiency of other specified B group vitamins: Secondary | ICD-10-CM

## 2016-05-16 MED ORDER — CYANOCOBALAMIN 1000 MCG/ML IJ SOLN
1000.0000 ug | Freq: Once | INTRAMUSCULAR | Status: AC
  Administered 2016-05-16: 1000 ug via INTRAMUSCULAR

## 2016-05-16 NOTE — Progress Notes (Signed)
Patient comes in for B 12 injection.  Injected right deltoid.  Patient tolerated injection.   

## 2016-05-18 NOTE — Progress Notes (Signed)
I have reviewed the above note and agree.  Dailey Buccheri, M.D.  

## 2016-05-24 ENCOUNTER — Telehealth: Payer: Self-pay | Admitting: *Deleted

## 2016-05-24 NOTE — Telephone Encounter (Signed)
Office visit scheduled. Patient advised.

## 2016-05-24 NOTE — Telephone Encounter (Signed)
-----   Message from Jerene Bears, MD sent at 05/23/2016  5:59 PM EST ----- Please schedule a consult visit for [name] with Dr. 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   I can set her up for pneumatic balloon dilation if she wants to go ahead with it.   Thanks VN

## 2016-05-29 ENCOUNTER — Telehealth: Payer: Self-pay | Admitting: Family Medicine

## 2016-05-29 NOTE — Telephone Encounter (Signed)
Pt declined to schedule AWV °

## 2016-05-30 ENCOUNTER — Encounter: Payer: Self-pay | Admitting: Internal Medicine

## 2016-05-30 NOTE — Telephone Encounter (Signed)
I would encourage patient to keep appointment with Dr. Silverio Decamp as this dilation is unlike any she has had previously. This is pneumatic dilation which stretches the fibers further than regular esophageal dilation. We also have re-reviewed prior manometry of the esophagus which is consistent with spastic achalasia another indication for pneumatic dilation If after her visit with Dr. Silverio Decamp she does not wish to proceed with pneumatic dilation we can then discuss repeat Botox She can come for Linzess samples and we can change her prescription back to Linzess 72 g daily

## 2016-06-01 ENCOUNTER — Other Ambulatory Visit: Payer: Self-pay

## 2016-06-01 MED ORDER — LINACLOTIDE 72 MCG PO CAPS: 72.0000 ug | ORAL_CAPSULE | Freq: Every day | ORAL | 3 refills | Status: DC

## 2016-06-01 MED ORDER — LINACLOTIDE 72 MCG PO CAPS: 72.0000 ug | ORAL_CAPSULE | Freq: Every day | ORAL | 0 refills | Status: DC

## 2016-06-15 ENCOUNTER — Ambulatory Visit (INDEPENDENT_AMBULATORY_CARE_PROVIDER_SITE_OTHER): Payer: Medicare Other

## 2016-06-15 ENCOUNTER — Telehealth: Payer: Self-pay | Admitting: Family Medicine

## 2016-06-15 DIAGNOSIS — E538 Deficiency of other specified B group vitamins: Secondary | ICD-10-CM

## 2016-06-15 MED ORDER — CYANOCOBALAMIN 1000 MCG/ML IJ SOLN
1000.0000 ug | Freq: Once | INTRAMUSCULAR | Status: AC
  Administered 2016-06-15: 1000 ug via INTRAMUSCULAR

## 2016-06-15 NOTE — Telephone Encounter (Signed)
Please advise 

## 2016-06-15 NOTE — Telephone Encounter (Signed)
Pt dropped off a handicapp renewal form to be signed. Paper is up front in Dr. [name] color folder.

## 2016-06-15 NOTE — Telephone Encounter (Signed)
Have you seen this form?

## 2016-06-15 NOTE — Telephone Encounter (Signed)
I just completed it and put it in the folder for him to sign at his desk, thanks

## 2016-06-15 NOTE — Progress Notes (Signed)
Patient came in for B12 injection.  Received in left deltoid.  Patient tolerated well, thanks  Patient dropped off Handicap tag paperwork at front desk.

## 2016-06-18 NOTE — Progress Notes (Signed)
I have reviewed the above note and agree.  Kinza Gouveia, M.D.  

## 2016-06-19 ENCOUNTER — Ambulatory Visit (INDEPENDENT_AMBULATORY_CARE_PROVIDER_SITE_OTHER): Payer: Medicare Other | Admitting: Gastroenterology

## 2016-06-19 ENCOUNTER — Encounter: Payer: Self-pay | Admitting: Gastroenterology

## 2016-06-19 VITALS — BP 100/60 | HR 64 | Ht 62.6 in | Wt 109.4 lb

## 2016-06-19 DIAGNOSIS — R1319 Other dysphagia: Secondary | ICD-10-CM

## 2016-06-19 DIAGNOSIS — R1310 Dysphagia, unspecified: Secondary | ICD-10-CM

## 2016-06-19 DIAGNOSIS — K220 Achalasia of cardia: Secondary | ICD-10-CM

## 2016-06-19 NOTE — Progress Notes (Signed)
Anne [name]    [id number]    11-25-1950  Primary Care Physician:Eric [name], MD  Referring Physician: [name], MD [address], [place] [address]  Chief complaint: Dysphagia, achalasia   HPI: 66 year old female with history of chronic dysphagia, chronic aspiration, vocal cord dysfunction, permanent tracheostomy here to discuss management of achalasia.  I personally reviewed esophageal manometry done in 2014, consistent with type III or spastic achalasia.  She had Botox injectionX2 and had improvement of swallowing subsequently for about 10-12 months. Most recent EGD with Botox injection on 05/04/2015. She feels she is having more difficulty swallowing associated with regurgitation as the effect of Botox has worn off . She feels the food just builds up in the back of her throat along with increased chest pressure.  She is currently taking ibuprofen 800 mg twice daily alternating with Tylenol for neck pain, she has cervical cervical spinal stenosis with degenerative disc disease and cervical radiculitis with nerve impingement C5-C7, planning to undergo surgery sometime in the near future. She also takes Plavix daily with history of CVA and CAD.   Outpatient Encounter Prescriptions as of 06/19/2016  Medication Sig  . albuterol (PROVENTIL HFA;VENTOLIN HFA) 108 (90 Base) MCG/ACT inhaler Inhale 2 puffs into the lungs every 6 (six) hours as needed for wheezing or shortness of breath.  . ALPRAZolam (XANAX) 0.25 MG tablet TAKE 1/2 TABLET BY MOUTH 2 TIMES DAILY AS NEEDED  . cetirizine (ZYRTEC) 10 MG tablet TAKE 1 TABLET (10 MG TOTAL) BY MOUTH DAILY.  [name] clopidogrel (PLAVIX) 75 MG tablet TAKE 1 TABLET BY MOUTH IN THE MORNING  . cyanocobalamin (,VITAMIN B-12,) 1000 MCG/ML injection Inject 1 mL (1,000 mcg total) into the muscle once.  [name] estradiol (ESTRACE) 1 MG tablet Take 1 tablet (1 mg total) by mouth daily.  . fluticasone (FLONASE) 50 MCG/ACT nasal  spray PLACE 2 SPRAYS INTO BOTH NOSTRILS DAILY.  . furosemide (LASIX) 20 MG tablet TAKE 1 TABLET BY MOUTH ONCE A DAY AS NEEDED  . hydroxypropyl methylcellulose (ISOPTO TEARS) 2.5 % ophthalmic solution Place 1 drop into both eyes 3 (three) times daily as needed for dry eyes.  [name] ibuprofen (ADVIL,MOTRIN) 200 MG tablet Take 400-600 mg by mouth every 6 (six) hours as needed for mild pain or moderate pain.  [name] ipratropium-albuterol (DUONEB) 0.5-2.5 (3) MG/3ML SOLN Take 3 mLs by nebulization every 4 (four) hours as needed. Dx 496 (Patient taking differently: Take 3 mLs by nebulization every 4 (four) hours as needed (difficulty breathing). )  . linaclotide (LINZESS) 72 MCG capsule Take 1 capsule (72 mcg total) by mouth daily before breakfast.  . montelukast (SINGULAIR) 10 MG tablet Take 10 mg by mouth daily.  . mupirocin ointment (BACTROBAN) 2 % Apply 1 application topically daily. To keep trach site from getting irritated  . pantoprazole (PROTONIX) 40 MG tablet TAKE 1 TABLET (40 MG TOTAL) BY MOUTH DAILY.  [name] potassium chloride 20 MEQ/15ML (10%) SOLN Take 15 mLs (20 mEq total) by mouth daily as needed (cramps).  . Probiotic Product (PROBIOTIC PO) Take 1 tablet by mouth daily.  . sodium chloride (OCEAN) 0.65 % SOLN nasal spray Place 1 spray into both nostrils every 4 (four) hours as needed for congestion.   [name] zolmitriptan (ZOMIG ZMT) 5 MG disintegrating tablet Take 1 tablet (5 mg total) by mouth as needed for migraine.  . [DISCONTINUED] montelukast (SINGULAIR) 10 MG tablet Take 1 tablet (10 mg total) by  mouth daily.  [name] EPINEPHrine (EPIPEN 2-PAK) 0.3 mg/0.3 mL IJ SOAJ injection Inject 0.3 mLs (0.3 mg total) into the muscle once. (Patient not taking: Reported on 06/19/2016)  . [DISCONTINUED] linaclotide (LINZESS) 72 MCG capsule Take 1 capsule (72 mcg total) by mouth daily before breakfast.   No facility-administered encounter medications on file as of 06/19/2016.     Allergies as of 06/19/2016 - Review Complete  06/19/2016  Allergen Reaction Noted  . Influenza vaccines Shortness Of Breath 01/14/2013  . Shellfish allergy Anaphylaxis 08/13/2012  . Quinolones  07/01/2013  . Asa [aspirin]  08/05/2012  . Ciprofloxacin Swelling 07/01/2013  . Levaquin [levofloxacin in d5w]  08/05/2012  . Septra [sulfamethoxazole-trimethoprim] Diarrhea 04/18/2013  . Eggs or egg-derived products Rash 08/13/2012    Past Medical History:  Diagnosis Date  . Allergic rhinitis   . Anemia   . Asthma   . Complication of anesthesia    Breathing problems. 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  . Compressed cervical disc   . COPD (chronic obstructive pulmonary disease) ([place])   . CVA (cerebral infarction)   . Esophageal dysmotility   . Heart murmur    as child  . IBS (irritable bowel syndrome)   . Migraine   . Problems with swallowing    intermittently  . Pulmonary fibrosis ([place])   . Shingles   . Stroke [facility])    slurred speech, drawn face, imaging normal, occurred twice, [facility]  . Tracheostomy in place [facility]   . Vocal cord paresis     Past Surgical History:  Procedure Laterality Date  . ABDOMINAL HYSTERECTOMY    . BOTOX INJECTION N/A 07/29/2013   Procedure: BOTOX INJECTION;  Surgeon: Jerene Bears, MD;  Location: [facility];  Service: Gastroenterology;  Laterality: N/A;  . BOTOX INJECTION N/A 05/04/2015   Procedure: BOTOX INJECTION;  Surgeon: Jerene Bears, MD;  Location: [facility];  Service: Gastroenterology;  Laterality: N/A;  . ESOPHAGEAL MANOMETRY N/A 12/16/2012   Procedure: ESOPHAGEAL MANOMETRY (EM);  Surgeon: Jerene Bears, MD;  Location: [facility];  Service: Gastroenterology;  Laterality: N/A;  . ESOPHAGOGASTRODUODENOSCOPY (EGD) WITH PROPOFOL N/A 07/29/2013   Procedure: ESOPHAGOGASTRODUODENOSCOPY (EGD) WITH PROPOFOL;  Surgeon: Jerene Bears, MD;  Location: [facility];  Service: Gastroenterology;  Laterality: N/A;  with botox injection  . ESOPHAGOGASTRODUODENOSCOPY (EGD) WITH PROPOFOL N/A 05/04/2015   Procedure:  ESOPHAGOGASTRODUODENOSCOPY (EGD) WITH PROPOFOL;  Surgeon: Jerene Bears, MD;  Location: [facility];  Service: Gastroenterology;  Laterality: N/A;  . EYE SURGERY     Catarct surgery 2014  . Eye Surgery AS Child    . JEJUNOSTOMY FEEDING TUBE     x2 both failed. no longer has  . TRACHEOSTOMY  1996   done at [facility], Dr. Kathyrn Sheriff  . TUBAL LIGATION    . VIDEO BRONCHOSCOPY Bilateral 11/20/2012   Procedure: VIDEO BRONCHOSCOPY WITH FLUORO;  Surgeon: Juanito Doom, MD;  Location: [facility];  Service: Cardiopulmonary;  Laterality: Bilateral;    Family History  Problem Relation Age of Onset  . Asthma Cousin   . COPD Cousin   . Breast cancer Maternal Grandmother 60  . Cancer Maternal Grandmother     breast  . Asthma Father   . Cancer Father     lung  . Cancer Paternal Uncle     lung  . COPD Paternal Grandfather   . Breast cancer Maternal Aunt 1  . Cancer Maternal Aunt     breast    Social History   Social History  .  Marital status: Married    Spouse name: N/A  . Number of children: 4  . Years of education: N/A   Occupational History  . Not on file.   Social History Main Topics  . Smoking status: Never Smoker  . Smokeless tobacco: Never Used  . Alcohol use No  . Drug use: No  . Sexual activity: Not on file   Other Topics Concern  . Not on file   Social History Narrative   Lives in [place] with husband.  She has four children.   Retired from [employer]: [employer] of Systems  Constitutional: Negative for fever and chills.  HENT: Negative.   Eyes: Negative for blurred vision.  Respiratory: Negative for cough, Positive for shortness of breath and wheezing.   Cardiovascular: Negative for chest pain and palpitations.  Gastrointestinal: as per HPI Genitourinary: Negative for dysuria, urgency, and hematuria.  to her frequent urination Musculoskeletal: Positive for myalgias, back pain and joint pain.  Skin: Negative for itching and  rash.  Neurological: Negative for dizziness, tremors, seizures and loss of consciousness.  positive for change in sensation, imbalance and weakness Endo/Heme/Allergies: Positive for seasonal allergies.  Psychiatric/Behavioral: Negative for depression, suicidal ideas and hallucinations.  All other systems reviewed and are negative.   Physical Exam: Vitals:   06/19/16 1031  BP: 100/60  Pulse: 64   Body mass index is 19.62 kg/m. Gen:      No acute distress HEENT:  EOMI, sclera anicteric Neck:     No masses; no thyromegaly. Tracheostomy with Trach Tube capped Lungs:    Clear to auscultation bilaterally; normal respiratory effort CV:         Regular rate and rhythm; no murmurs Abd:      + bowel sounds; soft, non-tender; no palpable masses, no distension. Scar tissue  X2 at site of prior PEG tube placement Ext:    No edema; adequate peripheral perfusion Skin:      Warm and dry; no rash Neuro: alert and oriented x 3 Psych: normal mood and affect  Data Reviewed:  Reviewed labs, radiology imaging, old records and pertinent past GI work up   Assessment and Plan/Recommendations:  66 year old female with history of chronic dysphagia, chronic intermittent aspiration, vocal cord dysfunction, status post tracheostomy, CVA, cervical degenerative disc disease C5-C7 with spinal stenosis and radiculitis, type III or spastic achalasia here to discuss management  Discussed in detail the benefits and risks associated with pneumatic balloon dilation Also briefly discussed Heller myotomy and POEM Patient was given printed material on management of achalasia to read.  Patient is interested in proceeding with pneumatic balloon dilation but she is currently also having severe neck pain with cervical disc disease and is considering undergoing cervical surgery soon.  We will plan for pneumatic balloon dilation either before cervical surgery if patient wants to proceed or after she is healed from the  surgery once cleared by Dr. Sharlet Salina spinal surgeon  We'll need to hold NSAID's 2 weeks prior to EGD with pneumatic balloon dilation We will discuss with prescribing M.D. if okay to hold Plavix 5 days prior to the procedure and 2 days after.  She'll continue to follow with Dr. Hilarie Fredrickson as scheduled  40 minutes was spent face-to-face with the patient. Greater than 50% of the time used for counseling as well as treatment plan and follow-up of achalasia and pneumatic balloon dilation. She had multiple questions which  were answered to her satisfaction  K. Denzil Magnuson , MD [phone] Mon-Fri 8a-5p [phone] after 5p, weekends, holidays  CC: [name], MD

## 2016-06-19 NOTE — Patient Instructions (Addendum)
It has been recommended to you by your physician that you have a(n)  Pneumatic Dilation at [facility] completed. Per your request, we did not schedule the procedure(s) today. Please contact our office at [phone] should you decide to have the procedure completed.  Follow up with Dr Hilarie Fredrickson

## 2016-06-22 NOTE — Telephone Encounter (Signed)
Please advise 

## 2016-06-22 NOTE — Telephone Encounter (Signed)
Left message to return call 

## 2016-06-22 NOTE — Telephone Encounter (Signed)
I have asked Anne [name] to clarify some things regarding the form. We'll complete the form when I know the answers.

## 2016-06-22 NOTE — Telephone Encounter (Signed)
Pt called looking for a status update of these forms. Please advise, thank you!  Call pt @ [phone] (may leave message)

## 2016-06-22 NOTE — Telephone Encounter (Signed)
Patient notified form is ready 

## 2016-07-05 ENCOUNTER — Other Ambulatory Visit: Payer: Self-pay | Admitting: Family Medicine

## 2016-07-06 ENCOUNTER — Other Ambulatory Visit: Payer: Self-pay | Admitting: Family Medicine

## 2016-07-06 NOTE — Telephone Encounter (Signed)
faxed

## 2016-07-06 NOTE — Telephone Encounter (Signed)
Last OV 01/10/16 last filled 04/27/16 30 2rf

## 2016-07-10 ENCOUNTER — Ambulatory Visit: Payer: Medicare Other | Admitting: Gastroenterology

## 2016-07-14 ENCOUNTER — Other Ambulatory Visit: Payer: Self-pay | Admitting: Neurological Surgery

## 2016-07-17 ENCOUNTER — Other Ambulatory Visit: Payer: Self-pay | Admitting: Internal Medicine

## 2016-07-18 ENCOUNTER — Encounter (HOSPITAL_COMMUNITY): Payer: Self-pay | Admitting: Vascular Surgery

## 2016-07-19 ENCOUNTER — Ambulatory Visit (INDEPENDENT_AMBULATORY_CARE_PROVIDER_SITE_OTHER): Payer: Medicare Other

## 2016-07-19 DIAGNOSIS — E538 Deficiency of other specified B group vitamins: Secondary | ICD-10-CM

## 2016-07-19 MED ORDER — CYANOCOBALAMIN 1000 MCG/ML IJ SOLN
1000.0000 ug | Freq: Once | INTRAMUSCULAR | Status: AC
  Administered 2016-07-19: 1000 ug via INTRAMUSCULAR

## 2016-07-19 NOTE — Progress Notes (Signed)
I have reviewed the above note and agree.  Randal Yepiz, M.D.  

## 2016-07-19 NOTE — Progress Notes (Signed)
Pt presented to receive a b12 injection today. Right deltoid. Pt voiced no concern or showed no sign of distress during injection.

## 2016-07-21 ENCOUNTER — Ambulatory Visit (INDEPENDENT_AMBULATORY_CARE_PROVIDER_SITE_OTHER): Payer: Medicare Other | Admitting: Pulmonary Disease

## 2016-07-21 ENCOUNTER — Encounter: Payer: Self-pay | Admitting: Pulmonary Disease

## 2016-07-21 VITALS — BP 108/64 | HR 73 | Resp 16 | Ht 62.6 in | Wt 108.0 lb

## 2016-07-21 DIAGNOSIS — J4530 Mild persistent asthma, uncomplicated: Secondary | ICD-10-CM

## 2016-07-21 DIAGNOSIS — J8410 Pulmonary fibrosis, unspecified: Secondary | ICD-10-CM | POA: Diagnosis not present

## 2016-07-21 DIAGNOSIS — Z01811 Encounter for preprocedural respiratory examination: Secondary | ICD-10-CM | POA: Diagnosis not present

## 2016-07-21 MED ORDER — PREDNISONE 20 MG PO TABS: 40.0000 mg | ORAL_TABLET | Freq: Every day | ORAL | 5 refills | Status: AC

## 2016-07-21 NOTE — Progress Notes (Signed)
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 No frank honeycombing. Findings have progressed since  2014. 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  INTERVAL HISTORY: Last seen by 03/31/16. No major pulmonary or respiratory events in interim  SUBJ: From a pulmonary point of view, there is no significant change overall. She continues to have mild to moderate exertional dyspnea at baseline. She also has episodes of increased dyspnea which responds to inhaled albuterol. She has minimal cough and sputum production.Denies CP, fever, purulent sputum, hemoptysis, LE edema and calf tenderness.   She has problems with chronic cervical stenosis and has seen Dr. Sherley Bounds with plans for surgery towards the end of May. Dr. Ronnald Ramp requests preoperative pulmonary evaluation.   OBJ: Vitals:   07/21/16 1007  BP: 108/64  Pulse: 73  Resp: 16  SpO2: 100%  Weight: 108 lb (49 kg)  Height: 5' 2.6" (1.59 m)   WDWN, NAD HEENT WNL No JVD, LAN. 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 She tried off of Singulair and feels that her asthma control is better while on that medication. 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 The presence of the tracheostomy tube will create some challenges perioperatively. It will have to be changed out to a cuffed tube for ventilatory support intraoperatively. It might create some problems with patient positioning as she notes that she will have to be in a prone position for the surgery. On the other hand, it will provide reliable airway support and access postoperatively and will potentially reduce the risk of any perioperative airway complications. Overall, I judge her risk of perioperative pulmonary complications to be only mildly increased compared to a population with no underlying lung disease. There are no interventions to be undertaken at this time that might further mitigate that risk  PLAN: 1) Continue current medical regimen and trach tube care including montelukast and PRN albuterol 2) Proceed with surgery as planned. Postoperatively, she should receive scheduled nebulized bronchodilators for the first 24-48 hours. Pulmonary/critical care medicine consultation postoperatively should be considered 3) Follow up in 3-4 months   Merton Border, MD PCCM service Mobile [phone] Pager [phone] 07/21/2016

## 2016-07-21 NOTE — Addendum Note (Signed)
Addended by: Merton Border B on: 07/21/2016 02:32 PM   Modules accepted: Orders

## 2016-07-21 NOTE — Patient Instructions (Signed)
Proceed with surgery as planned I will communicate with Dr Ronnald Ramp Follow up in 3-4 months or as needed

## 2016-08-02 ENCOUNTER — Telehealth: Payer: Self-pay | Admitting: Family Medicine

## 2016-08-02 NOTE — Telephone Encounter (Signed)
[place] to address when he returns given surgery date.

## 2016-08-02 NOTE — Telephone Encounter (Signed)
Please advise 

## 2016-08-02 NOTE — Telephone Encounter (Signed)
Pt called and stated that she is having neck surgery on 5/23. Pt states that she needs to be off her Plavix for 7 days prior. Please advise, thank you!  Call pt @ [phone] or [phone]

## 2016-08-06 ENCOUNTER — Other Ambulatory Visit: Payer: Self-pay | Admitting: Internal Medicine

## 2016-08-06 ENCOUNTER — Other Ambulatory Visit: Payer: Self-pay | Admitting: Family Medicine

## 2016-08-07 NOTE — Telephone Encounter (Signed)
Pt called back to follow up on the intinal note. Thank you!  Call pt @ [phone].

## 2016-08-07 NOTE — Telephone Encounter (Signed)
Please advise 

## 2016-08-07 NOTE — Pre-Procedure Instructions (Addendum)
DILANA MCPHIE  08/07/2016      [facility] - [place], [place] [address] Phone: [phone] Fax: [phone]    Your procedure is scheduled on May 23   Report to [place] at 530 A.M.  Call this number if you have problems the morning of surgery:  [phone]   Remember:  Do not eat food or drink liquids after midnight.  Take these medicines the morning of surgery with A SIP OF WATER Tylenol if needed, albuterol inhaler if needed,  Xanax if needed,  Flonase nasal spray if needed, eye drops if needed, Duoneb if needed, Linzess if needed, singulair if needed, Protonix, Bring your inhalers with you on the day of surgery.  Stop taking aspirin, BC's, Goody's, Herbal medications, Fish Oil, Ibuprofen, Advil, Motrin, Aleve, Vitamins   Stop Plavix as directed by your Dr.    Lazaro Arms not wear jewelry, make-up or nail polish.  Do not wear lotions, powders, or perfumes, or deoderant.  Do not shave 48 hours prior to surgery.  Men may shave face and neck.  Do not bring valuables to the hospital.  [facility] is not responsible for any belongings or valuables.  Contacts, dentures or bridgework may not be worn into surgery.  Leave your suitcase in the car.  After surgery it may be brought to your room.  For patients admitted to the hospital, discharge time will be determined by your treatment team.  Patients discharged the day of surgery will not be allowed to drive home.    Special instructions: [place] - Preparing for Surgery  Before surgery, you can play an important role.  Because skin is not sterile, your skin needs to be as free of germs as possible.  You can reduce the number of germs on you skin by washing with CHG (chlorahexidine gluconate) soap before surgery.  CHG is an antiseptic cleaner which kills germs and bonds with the skin to continue killing germs even after washing.  Please DO NOT use if you  have an allergy to CHG or antibacterial soaps.  If your skin becomes reddened/irritated stop using the CHG and inform your nurse when you arrive at Short Stay.  Do not shave (including legs and underarms) for at least 48 hours prior to the first CHG shower.  You may shave your face.  Please follow these instructions carefully:   1.  Shower with CHG Soap the night before surgery and the                                morning of Surgery.  2.  If you choose to wash your hair, wash your hair first as usual with your       normal shampoo.  3.  After you shampoo, rinse your hair and body thoroughly to remove the                      Shampoo.  4.  Use CHG as you would any other liquid soap.  You can apply chg directly       to the skin and wash gently with scrungie or a clean washcloth.  5.  Apply the CHG Soap to your body ONLY FROM THE NECK DOWN.        Do not use on open wounds or open sores.  Avoid  contact with your eyes,       ears, mouth and genitals (private parts).  Wash genitals (private parts)       with your normal soap.  6.  Wash thoroughly, paying special attention to the area where your surgery        will be performed.  7.  Thoroughly rinse your body with warm water from the neck down.  8.  DO NOT shower/wash with your normal soap after using and rinsing off       the CHG Soap.  9.  Pat yourself dry with a clean towel.            10.  Wear clean pajamas.            11.  Place clean sheets on your bed the night of your first shower and do not        sleep with pets.  Day of Surgery  Do not apply any lotions/deoderants the morning of surgery.  Please wear clean clothes to the hospital/surgery center.      Please read over the following fact sheets that you were given. Pain Booklet, Coughing and Deep Breathing, MRSA Information and Surgical Site Infection Prevention

## 2016-08-07 NOTE — Telephone Encounter (Signed)
Please determine if they have already given her directions for this. I believe she is on this medicine for a history of TIA many years ago. Please confirm that this is true and then I can give recommendations. Please confirm that she doesn't have any stents anywhere. Thanks.

## 2016-08-07 NOTE — Progress Notes (Addendum)
Anesthesia PAT Evaluation: Patient is a 66 year old female scheduled for laminectomy and foraminotomy C4-5, C5-6, posterior cervical instrumented fusion C3-7 on 08/16/16 (first case) by Dr. Sherley Bounds.  Surgeon and patient have requested an ANESTHESIOLOGIST consult (re: tracheostomy '96, pulmonary fibrosis, COPD, asthma, vocal cord dysfunction and airway/positioning recommendations for surgery in prone position).   History includes non-smoker, asthma, COPD, pulmonary fibrosis, left vocal cord "mechanical" dysfunction, tracheostomy '96 (#4 [place]; placed for recurrent upper airway obstruction related to asthma and left vocal cord "mechanical" dysfunction), childhood murmur, anemia, migraines, suspected TIA (~ '98 left facial droop and ~ '03 dysarthria; reported MRI negative), cervical degenerative disc disease, IBS, hysterectomy, jejunostomy feeding tube (doesn't have any longer), dysphagia (eats small amount, little to no meat), achalasia (type III or spastic), video bronchoscopy (negative fungal cultures) 11/20/12.   - PCP is Dr. Tommi Rumps. - Pulmonologist is [facility]. She was evaluated by Dr. Merton Border on 07/21/16 for pre-operative pulmonary evaluation (previously had seen Dr. Simonne Maffucci). 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 She tried off of Singulair and feels that her asthma control is better while on that medication. 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 The presence of the tracheostomy tube will create some challenges perioperatively. It will have to be changed out to a cuffed tube for ventilatory support intraoperatively.  It might create some problems with patient positioning as she notes that she will have to be in a prone position for the surgery. On the other hand, it will provide reliable airway support and access postoperatively and will potentially reduce the risk of any perioperative airway complications. Overall, I judge her risk of perioperative pulmonary complications to be only mildly increased compared to a population with no underlying lung disease. There are no interventions to be undertaken at this time that might further mitigate that risk  PLAN: 1) Continue current medical regimen and trach tube care including montelukast and PRN albuterol 2) Proceed with surgery as planned. Postoperatively, she should receive scheduled nebulized bronchodilators for the first 24-48 hours. Pulmonary/critical care medicine consultation postoperatively should be considered 3) Follow up in 3-4 months   - GI is Dr. Zenovia Jarred. - ENT is Dr. Ermalinda Barrios. - She was evaluated by cardiologist Dr. Kathlyn Sacramento in the fall of 2015 for atypical chest pain. She had a stress (followed by echo to confirm EF) with results below.  Meds include albuterol, Xanax, Zyrtec, Tussionex, Plavix (last dose 08/08/16), estradiol, Flonase, Lasix, Duoneb, Linzess, Salonpas Pain Relief Patch, Singulair, Protonix, KCl.    BP 112/62   Pulse 76   Temp 36.4 C   Resp 18   Ht 5' 3.5" (1.613 m)   Wt 108 lb 12.8 oz (49.4 kg)   SpO2 100%   BMI 18.97 kg/m   Exam shows a pleasant, thin Caucasian female in NAD. Heart RRR, no murmur noted. Overall lungs clear, probable very faint intermittent wheeze LUL. #4 Shilley trach, capped. No drainage. No ankle edema. She denied chest pain. Occasional skipped beats. She is able to walk up a flight of stairs. Voice soft, but overall good quality. She reports she is able to sing now.   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    Nuclear stress test 02/03/14 [facility]): Summary:  1. Pharmacological myocardial perfusion study with no significant ischemia. 2. No significant wall motion abnormality noted. 3. The LV global function was abnormal. 4. EF 37%. 5. There are EKG changes concerning for ischemia at peak stress, 1 mm depressions in V4-6. 6. There is GI uptake artifact noted on this study. 7. Overall, this is a Low risk scan. 8. Consider an echocardiogram to confirm depressed EF.  Echo 02/16/14: Study Conclusions - Left ventricle: The cavity size was normal. Systolic function was normal. The estimated ejection fraction was in the range of 60% to 65%. Wall motion was normal; there were no regional wall motion abnormalities. Left ventricular diastolic function parameters were normal. - Aortic valve: There was mild regurgitation. - Mitral valve: There was mild regurgitation. - Right ventricle: Systolic function was normal. - Pulmonary arteries: Systolic pressure was within the normal range. Impressions: - Otherwise a normal study.  Carotid U/S 07/05/04: IMPRESSION:  1)There is noted very slight calcific plaque formation on the RIGHT.  2)No hemodynamically significant stenosis is seen on either side.  3)Antegrade flow is noted in both vertebrals.   CT Chest High Resolution 03/01/16: IMPRESSION: 1. Extensive patchy subpleural reticulation, traction bronchiectasis, volume loss, distortion and pleural thickening relatively symmetrically and almost exclusively involving the upper lobes with associated superior hilar retraction. No frank honeycombing. Findings have progressed since 2014. 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 2. Mild patchy tree-in-bud opacities in the basilar lower lobes, similar to but less prominent than the 08/08/2012 chest CT, favoring a mild infectious or inflammatory bronchiolitis with the differential including mild  aspiration.  CXR 10/12/15: IMPRESSION: Tracheostomy tube in stable position. Bilateral pulmonary pleural-parenchymal thickening noted consistent with scarring. No acute cardiopulmonary disease.  Preoperative labs noted. K 3.4 (patient notified; she takes KCl PRN at home), Cr 1.06. H/H 11.9/36.1. PT/INR WNL. Glucose 86.   Patient was evaluated by myself and anesthesiologist Dr. Conrad Offerman during her PAT visit. He discussed to expect that her #4 Ralene Ok would be temporarily exchanged with a larger ETT (at least 6.0) during surgery and secured for prone positioning. Post-operatively she would ultimately get a #4 Shilley replaced but due to larger ETT size, she could have a temporary leak and may require a cuffed trach and/or Cortisone injection. Dr. Alva Garnet recommendations also include scheduled nebulized bronchodilators for the first 24-48 hours and consideration of PCCM consult post-operatively.   If no acute changes then I anticipate that she can proceed as planned.  George Hugh [facility] Short Stay Center/Anesthesiology Phone [phone] 08/08/2016 12:44 PM

## 2016-08-08 ENCOUNTER — Encounter (HOSPITAL_COMMUNITY): Payer: Self-pay

## 2016-08-08 ENCOUNTER — Ambulatory Visit (HOSPITAL_COMMUNITY)
Admission: RE | Admit: 2016-08-08 | Discharge: 2016-08-08 | Disposition: A | Discharge status: Home / Self Care | Payer: Medicare Other | Source: Ambulatory Visit | Attending: Neurological Surgery | Admitting: Neurological Surgery

## 2016-08-08 ENCOUNTER — Encounter (HOSPITAL_COMMUNITY)
Admission: RE | Admit: 2016-08-08 | Discharge: 2016-08-08 | Disposition: A | Discharge status: Home / Self Care | Payer: Medicare Other | Source: Ambulatory Visit | Attending: Neurological Surgery | Admitting: Neurological Surgery

## 2016-08-08 DIAGNOSIS — J449 Chronic obstructive pulmonary disease, unspecified: Secondary | ICD-10-CM | POA: Insufficient documentation

## 2016-08-08 DIAGNOSIS — J8410 Pulmonary fibrosis, unspecified: Secondary | ICD-10-CM | POA: Insufficient documentation

## 2016-08-08 DIAGNOSIS — J4530 Mild persistent asthma, uncomplicated: Secondary | ICD-10-CM | POA: Insufficient documentation

## 2016-08-08 DIAGNOSIS — I424 Endocardial fibroelastosis: Secondary | ICD-10-CM | POA: Insufficient documentation

## 2016-08-08 DIAGNOSIS — M5030 Other cervical disc degeneration, unspecified cervical region: Secondary | ICD-10-CM | POA: Diagnosis not present

## 2016-08-08 DIAGNOSIS — Z930 Tracheostomy status: Secondary | ICD-10-CM | POA: Insufficient documentation

## 2016-08-08 DIAGNOSIS — J470 Bronchiectasis with acute lower respiratory infection: Secondary | ICD-10-CM | POA: Insufficient documentation

## 2016-08-08 DIAGNOSIS — K589 Irritable bowel syndrome without diarrhea: Secondary | ICD-10-CM | POA: Insufficient documentation

## 2016-08-08 DIAGNOSIS — M4802 Spinal stenosis, cervical region: Secondary | ICD-10-CM

## 2016-08-08 HISTORY — DX: Personal history of other diseases of the digestive system: Z87.19

## 2016-08-08 HISTORY — DX: Dyspnea, unspecified: R06.00

## 2016-08-08 LAB — CBC WITH DIFFERENTIAL/PLATELET
Basophils Absolute: 0 10*3/uL (ref 0.0–0.1)
Basophils Relative: 0 %
Eosinophils Absolute: 0.2 10*3/uL (ref 0.0–0.7)
Eosinophils Relative: 4 %
HCT: 36.1 % (ref 36.0–46.0)
Hemoglobin: 11.9 g/dL — ABNORMAL LOW (ref 12.0–15.0)
Lymphocytes Relative: 28 %
Lymphs Abs: 1.4 10*3/uL (ref 0.7–4.0)
MCH: 30.8 pg (ref 26.0–34.0)
MCHC: 33 g/dL (ref 30.0–36.0)
MCV: 93.5 fL (ref 78.0–100.0)
Monocytes Absolute: 0.5 10*3/uL (ref 0.1–1.0)
Monocytes Relative: 9 %
Neutro Abs: 2.9 10*3/uL (ref 1.7–7.7)
Neutrophils Relative %: 59 %
Platelets: 218 10*3/uL (ref 150–400)
RBC: 3.86 MIL/uL — ABNORMAL LOW (ref 3.87–5.11)
RDW: 12.9 % (ref 11.5–15.5)
WBC: 5 10*3/uL (ref 4.0–10.5)

## 2016-08-08 LAB — TYPE AND SCREEN
ABO/RH(D): O NEG
Antibody Screen: NEGATIVE

## 2016-08-08 LAB — SURGICAL PCR SCREEN
MRSA, PCR: NEGATIVE
Staphylococcus aureus: NEGATIVE

## 2016-08-08 LAB — BASIC METABOLIC PANEL
Anion gap: 8 (ref 5–15)
BUN: 13 mg/dL (ref 6–20)
CO2: 29 mmol/L (ref 22–32)
Calcium: 9.4 mg/dL (ref 8.9–10.3)
Chloride: 103 mmol/L (ref 101–111)
Creatinine, Ser: 1.06 mg/dL — ABNORMAL HIGH (ref 0.44–1.00)
GFR calc Af Amer: >60 mL/min (ref >60)
GFR calc non Af Amer: 53 mL/min — ABNORMAL LOW (ref >60)
Glucose, Bld: 86 mg/dL (ref 65–99)
Potassium: 3.4 mmol/L — ABNORMAL LOW (ref 3.5–5.1)
Sodium: 140 mmol/L (ref 135–145)

## 2016-08-08 LAB — ABO/RH: ABO/RH(D): O NEG

## 2016-08-08 LAB — PROTIME-INR
INR: 1
Prothrombin Time: 13.2 seconds (ref 11.4–15.2)

## 2016-08-08 NOTE — Progress Notes (Signed)
PCP is Dr. Tommi Rumps Cardiologist is Dr. Genia Del Pulmonologist  is Dr. Merton Border States last dose of Plavix was 08-08-16 States she had 2 tia's first in 1998 or 1999 second was in 2003 states that they never determined that it was a stroke or not. States she stayed in th hospital for observation.

## 2016-08-08 NOTE — Telephone Encounter (Signed)
Left message to return call 

## 2016-08-08 NOTE — Telephone Encounter (Signed)
Patient is on medicine due to prior TIA years ago

## 2016-08-08 NOTE — Telephone Encounter (Signed)
This should be adequate. It appears that based on this she has received directions from her surgeon already. Please ensure that she was on this because of a prior history of TIA and that she does not have stents or any recent reason to be on this medication. Thanks.

## 2016-08-08 NOTE — Telephone Encounter (Signed)
Patient states she took her last one today and will be off of it for 7 days and will restart it after the surgery

## 2016-08-08 NOTE — Telephone Encounter (Signed)
Noted  

## 2016-08-10 ENCOUNTER — Telehealth: Payer: Self-pay | Admitting: *Deleted

## 2016-08-10 NOTE — Telephone Encounter (Signed)
Pt will have a tooth abstraction on 08/16/16. Patient has been off of plavixs for two days. The dentist has requested a stated from the provider that its okay for pt being off of her plavixs for 5 days. Pt's last dose was on 08/08/16 Please fax to :Anette Riedel  Fax [phone] Phone [phone]

## 2016-08-10 NOTE — Telephone Encounter (Signed)
Wrote letter, Dr,Cook signed and faxed to number given

## 2016-08-10 NOTE — Telephone Encounter (Signed)
Please advise 

## 2016-08-10 NOTE — Telephone Encounter (Signed)
Yes this is okay 

## 2016-08-14 ENCOUNTER — Other Ambulatory Visit: Payer: Self-pay | Admitting: *Deleted

## 2016-08-14 MED ORDER — PANTOPRAZOLE SODIUM 40 MG PO TBEC: 40.0000 mg | DELAYED_RELEASE_TABLET | Freq: Every day | ORAL | 0 refills | Status: DC

## 2016-08-17 ENCOUNTER — Other Ambulatory Visit: Payer: Self-pay | Admitting: Family Medicine

## 2016-08-24 ENCOUNTER — Other Ambulatory Visit: Payer: Self-pay | Admitting: Family Medicine

## 2016-08-25 NOTE — Telephone Encounter (Signed)
Pt called and was requesting this refill. Pt is just about out of medication. Please advise, thank you!

## 2016-08-25 NOTE — Telephone Encounter (Signed)
Appointment for lab scheduled

## 2016-08-25 NOTE — Telephone Encounter (Signed)
Refills given. Patient needs potassium rechecked. Please get her set up for lab visit. Thanks.

## 2016-08-25 NOTE — Telephone Encounter (Signed)
Last BMET potassium was 3.4 during hospital encounter on 08/08/16 please advise to refill?

## 2016-08-29 ENCOUNTER — Telehealth: Payer: Self-pay | Admitting: Radiology

## 2016-08-29 DIAGNOSIS — E876 Hypokalemia: Secondary | ICD-10-CM

## 2016-08-29 NOTE — Telephone Encounter (Signed)
Patient had labs done just a few weeks ago. I am unsure why she needs additional lab work. Please see if we contacted her regarding this.

## 2016-08-29 NOTE — Telephone Encounter (Signed)
Pt coming in tomorrow for labs, please place future orders. Thank you.  

## 2016-08-30 ENCOUNTER — Other Ambulatory Visit (INDEPENDENT_AMBULATORY_CARE_PROVIDER_SITE_OTHER): Payer: Medicare Other

## 2016-08-30 ENCOUNTER — Ambulatory Visit (INDEPENDENT_AMBULATORY_CARE_PROVIDER_SITE_OTHER): Payer: Medicare Other | Admitting: *Deleted

## 2016-08-30 VITALS — Ht 63.5 in | Wt 104.0 lb

## 2016-08-30 DIAGNOSIS — E538 Deficiency of other specified B group vitamins: Secondary | ICD-10-CM | POA: Diagnosis not present

## 2016-08-30 DIAGNOSIS — E876 Hypokalemia: Secondary | ICD-10-CM | POA: Diagnosis not present

## 2016-08-30 LAB — POTASSIUM: Potassium: 3.3 mEq/L — ABNORMAL LOW (ref 3.5–5.1)

## 2016-08-30 MED ORDER — CYANOCOBALAMIN 1000 MCG/ML IJ SOLN
1000.0000 ug | Freq: Once | INTRAMUSCULAR | Status: AC
  Administered 2016-08-30: 1000 ug via INTRAMUSCULAR

## 2016-08-30 NOTE — Telephone Encounter (Signed)
Per refill note 08/24/16 patient needs potassium checked

## 2016-08-30 NOTE — Progress Notes (Signed)
I have reviewed the above note and agree.  Kelvin Burpee, M.D.  

## 2016-08-30 NOTE — Progress Notes (Signed)
Patient presented for monthly B 12 injection to left deltoid, patient voiced no concerns nor showed any sign of distress during injection.

## 2016-08-30 NOTE — Addendum Note (Signed)
Addended by: [name] on: 08/30/2016 08:31 AM   Modules accepted: Orders

## 2016-08-30 NOTE — Telephone Encounter (Signed)
Order placed

## 2016-08-30 NOTE — Telephone Encounter (Signed)
Please see Dr. [name] message and verify with patient

## 2016-08-31 ENCOUNTER — Other Ambulatory Visit: Payer: Self-pay | Admitting: Family Medicine

## 2016-08-31 DIAGNOSIS — E876 Hypokalemia: Secondary | ICD-10-CM

## 2016-09-01 ENCOUNTER — Other Ambulatory Visit: Payer: Self-pay | Admitting: Family Medicine

## 2016-09-01 ENCOUNTER — Encounter: Payer: Self-pay | Admitting: Family Medicine

## 2016-09-04 ENCOUNTER — Other Ambulatory Visit: Payer: Medicare Other

## 2016-09-06 ENCOUNTER — Telehealth: Payer: Self-pay

## 2016-09-06 ENCOUNTER — Other Ambulatory Visit (INDEPENDENT_AMBULATORY_CARE_PROVIDER_SITE_OTHER): Payer: Medicare Other

## 2016-09-06 DIAGNOSIS — E876 Hypokalemia: Secondary | ICD-10-CM

## 2016-09-06 LAB — POTASSIUM: Potassium: 3.5 mEq/L (ref 3.5–5.1)

## 2016-09-06 NOTE — Telephone Encounter (Signed)
Left message to return call 

## 2016-09-06 NOTE — Telephone Encounter (Signed)
-----   Message from [name], MD sent at 09/06/2016  2:16 PM EDT ----- Please let patient know that her potassium is now normal. It appears it has been some time since we last saw her in the office. I would like to set up a follow-up for her and we can potentially recheck her potassium at that time. Thanks.

## 2016-09-07 NOTE — Telephone Encounter (Signed)
Patient notified and scheduled for appointment 

## 2016-09-12 ENCOUNTER — Encounter: Payer: Self-pay | Admitting: Family Medicine

## 2016-09-12 ENCOUNTER — Other Ambulatory Visit: Payer: Self-pay | Admitting: Family Medicine

## 2016-09-12 ENCOUNTER — Ambulatory Visit (INDEPENDENT_AMBULATORY_CARE_PROVIDER_SITE_OTHER): Payer: Medicare Other | Admitting: Family Medicine

## 2016-09-12 ENCOUNTER — Ambulatory Visit (INDEPENDENT_AMBULATORY_CARE_PROVIDER_SITE_OTHER): Payer: Medicare Other

## 2016-09-12 VITALS — BP 114/76 | HR 93 | Temp 98.0°F | Wt 108.4 lb

## 2016-09-12 DIAGNOSIS — R05 Cough: Secondary | ICD-10-CM

## 2016-09-12 DIAGNOSIS — M47812 Spondylosis without myelopathy or radiculopathy, cervical region: Secondary | ICD-10-CM

## 2016-09-12 DIAGNOSIS — R059 Cough, unspecified: Secondary | ICD-10-CM

## 2016-09-12 DIAGNOSIS — K220 Achalasia of cardia: Secondary | ICD-10-CM

## 2016-09-12 DIAGNOSIS — J4521 Mild intermittent asthma with (acute) exacerbation: Secondary | ICD-10-CM

## 2016-09-12 DIAGNOSIS — G43909 Migraine, unspecified, not intractable, without status migrainosus: Secondary | ICD-10-CM | POA: Insufficient documentation

## 2016-09-12 DIAGNOSIS — G43109 Migraine with aura, not intractable, without status migrainosus: Secondary | ICD-10-CM | POA: Diagnosis not present

## 2016-09-12 DIAGNOSIS — J45909 Unspecified asthma, uncomplicated: Secondary | ICD-10-CM | POA: Insufficient documentation

## 2016-09-12 MED ORDER — PREDNISONE 20 MG PO TABS: 40.0000 mg | ORAL_TABLET | Freq: Every day | ORAL | 0 refills | Status: DC

## 2016-09-12 MED ORDER — ZOLMITRIPTAN 5 MG PO TBDP: 5.0000 mg | ORAL_TABLET | Freq: Every day | ORAL | 0 refills | Status: DC | PRN

## 2016-09-12 MED ORDER — ALBUTEROL SULFATE HFA 108 (90 BASE) MCG/ACT IN AERS: 2.0000 | INHALATION_SPRAY | Freq: Four times a day (QID) | RESPIRATORY_TRACT | 5 refills | Status: DC | PRN

## 2016-09-12 NOTE — Assessment & Plan Note (Signed)
Reports she is going to undergo surgical intervention for her cervical spine issues. She will continue follow-up with her surgeon. Neurologically intact.

## 2016-09-12 NOTE — Progress Notes (Signed)
Tommi Rumps, MD Phone: [phone]  Anne [name] is a 66 y.o. female who presents today for follow-up.  Asthma: Patient reports history of asthma. Notes this has been bothering her over the last 3 days. Has had some upper respiratory congestion and postnasal drip. Notes cough that is productive of clear mucus. Notes some wheezing. Notes some shortness of breath. No chest pain. Notes it feels difficult to get air in at times. Does have postnasal drip. No fevers. She typically has issues with her asthma when the temperature changes to being very hot or very cold. Has responded to albuterol at home. She notes this is consistent with her asthma exacerbations.  Migraines: She had a headache over the weekend. She took Zomig and this helped the headache. Notes minimal headache currently. Does have some chronic issues related to her neck for which she is going to have surgery though no new numbness or weakness. She does have aura typically with her migraines where she sees spots and also difficulty reading. No vision changes.  Dysphagia: Has had a hard time swallowing. Was supposed to see her GI physician for Botox to help with this though she was unable to do this. She regurgitates food if she is not careful and if she eats too large of amount.  PMH: nonsmoker.   ROS see history of present illness  Objective  Physical Exam Vitals:   09/12/16 1122 09/12/16 1153  BP: (!) 148/76 114/76  Pulse: 93   Temp: 98 F (36.7 C)     BP Readings from Last 3 Encounters:  09/12/16 114/76  08/08/16 112/62  07/21/16 108/64   Wt Readings from Last 3 Encounters:  09/12/16 108 lb 6.4 oz (49.2 kg)  08/30/16 104 lb (47.2 kg)  08/08/16 108 lb 12.8 oz (49.4 kg)    Physical Exam  Constitutional: No distress.  HENT:  Head: Normocephalic and atraumatic.  Mouth/Throat: Oropharynx is clear and moist. No oropharyngeal exudate.  Eyes: Conjunctivae are normal. Pupils are equal, round, and reactive to  light.  Cardiovascular: Normal rate, regular rhythm and normal heart sounds.   Pulmonary/Chest: Effort normal. No respiratory distress. She has no wheezes. She has no rales.  Minimally coarse breath sounds  Musculoskeletal: She exhibits no edema.  Neurological: She is alert.  CN 2-12 intact, 5/5 strength in bilateral biceps, triceps, grip, quads, hamstrings, plantar and dorsiflexion, sensation to light touch intact in bilateral UE and LE, normal gait  Skin: Skin is warm and dry. She is not diaphoretic.     Assessment/Plan: Please see individual problem list.  Asthma Patient with current asthma exacerbation. Does have history of postinflammatory changes as well. Lungs sound slightly coarse. Doubt pneumonia though given shortness of breath we will obtain a chest x-ray. We'll treat with prednisone. She'll use her albuterol inhaler every 6 hours for the next 2 days and then every 6 hours as needed. She'll monitor. Given return precautions.  Achalasia of esophagus Weight slightly up from prior. Encouraged her to follow-up with GI to consider treatment given regurgitation of food.  Degenerative arthritis of cervical spine Reports she is going to undergo surgical intervention for her cervical spine issues. She will continue follow-up with her surgeon. Neurologically intact.  Migraine Patient with chronic intermittent issues with migraines. Recently migraine responded to Zomig. It appears that this might be out of date and she will check with the pharmacy to see if it is still good. If it is not she'll let us know for refills. Discussed return precautions.  Orders Placed This Encounter  Procedures  . DG Chest 2 View    Standing Status:   Future    Number of Occurrences:   1    Standing Expiration Date:   11/12/2017    Order Specific Question:   Reason for Exam (SYMPTOM  OR DIAGNOSIS REQUIRED)    Answer:   cough, congestion, wheezing, dyspnea, history of asthma    Order Specific Question:    Preferred imaging location?    Answer:   [employer] Specific Question:   Radiology Contrast Protocol - do NOT remove file path    Answer:   [email or web address]    Meds ordered this encounter  Medications  . predniSONE (DELTASONE) 20 MG tablet    Sig: Take 2 tablets (40 mg total) by mouth daily with breakfast.    Dispense:  10 tablet    Refill:  0  . albuterol (PROVENTIL HFA;VENTOLIN HFA) 108 (90 Base) MCG/ACT inhaler    Sig: Inhale 2 puffs into the lungs every 6 (six) hours as needed for wheezing or shortness of breath.    Dispense:  1 Inhaler    Refill:  5    Please use ventolin as this is covered on pt's formulary.  Thanks!    Tommi Rumps, MD [place]

## 2016-09-12 NOTE — Assessment & Plan Note (Signed)
Patient with current asthma exacerbation. Does have history of postinflammatory changes as well. Lungs sound slightly coarse. Doubt pneumonia though given shortness of breath we will obtain a chest x-ray. We'll treat with prednisone. She'll use her albuterol inhaler every 6 hours for the next 2 days and then every 6 hours as needed. She'll monitor. Given return precautions.

## 2016-09-12 NOTE — Patient Instructions (Signed)
Nice to see you. I believe you're having an asthma exacerbation. We'll obtain a chest x-ray. Will start you on prednisone. You should use your albuterol inhaler every 6 hours for the next 2 days and then every 6 hours as needed. Please monitor your migraines and if it recurs please let us know. If you develop any new numbness or weakness please seek medical attention.  You should follow up with your surgeon and your GI physician. If you develop chest pain, worsening breathing issues, cough productive of blood, fevers, or any new or changing symptoms please seek medical attention immediately.

## 2016-09-12 NOTE — Assessment & Plan Note (Signed)
Weight slightly up from prior. Encouraged her to follow-up with GI to consider treatment given regurgitation of food.

## 2016-09-12 NOTE — Assessment & Plan Note (Signed)
Patient with chronic intermittent issues with migraines. Recently migraine responded to Zomig. It appears that this might be out of date and she will check with the pharmacy to see if it is still good. If it is not she'll let us know for refills. Discussed return precautions.

## 2016-09-13 NOTE — Telephone Encounter (Signed)
[name] from pharmacy called and stated that this medication needs a PA. The other medications approved by the insurance are imitrex 50 mg, resitriptin 10 mg. Please advise, thank you!  [place], [place]

## 2016-09-14 NOTE — Telephone Encounter (Signed)
PA approved from 06/25/2016-09/13/2017. Thanks

## 2016-09-22 ENCOUNTER — Encounter: Payer: Self-pay | Admitting: Family Medicine

## 2016-09-25 ENCOUNTER — Inpatient Hospital Stay (HOSPITAL_COMMUNITY): Admission: RE | Admit: 2016-09-25 | Payer: Medicare Other | Source: Ambulatory Visit

## 2016-09-26 ENCOUNTER — Encounter: Payer: Self-pay | Admitting: Family

## 2016-09-26 ENCOUNTER — Ambulatory Visit (INDEPENDENT_AMBULATORY_CARE_PROVIDER_SITE_OTHER): Payer: Medicare Other | Admitting: Family

## 2016-09-26 VITALS — BP 120/74 | HR 89 | Temp 97.8°F | Ht 63.5 in | Wt 107.4 lb

## 2016-09-26 DIAGNOSIS — J4521 Mild intermittent asthma with (acute) exacerbation: Secondary | ICD-10-CM

## 2016-09-26 MED ORDER — PREDNISONE 10 MG PO TABS: ORAL_TABLET | ORAL | 0 refills | Status: DC

## 2016-09-26 NOTE — Patient Instructions (Addendum)
Prednisone taper.  Continue to use neb/inhalers at home  Stay vigilant and let me know if not better as may need xray of chest and antibiotics.

## 2016-09-26 NOTE — Progress Notes (Signed)
Subjective:    Patient ID: Anne [name], female    DOB: 1950/10/23, 66 y.o.   MRN: [id number]  CC: Anne [name] is a 66 y.o. female who presents today for an acute visit.    HPI: CC: sneezing, cough x 6 days; unchanged,  returned since being treated on prednisone.   Endorses sore throat, wheezing, sinus pressure.  No ear pain, chest pain, fever, chills.   Seen by PCP 6/19 and started on prednisone and thinks it was 'too short' ( 5 days).   Inhaler and nebulizer with temporary relief.   Has canceled surgery for neck this week.   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        h/o asthma  HISTORY:  Past Medical History:  Diagnosis Date  . Allergic rhinitis   . Anemia   . Asthma   . Complication of anesthesia    Breathing problems. 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  . Compressed cervical disc   . COPD (chronic obstructive pulmonary disease) ([place])   . CVA (cerebral infarction)   . Dyspnea   . Esophageal dysmotility   . Heart murmur    as child  . History of hiatal hernia   . IBS (irritable bowel syndrome)   . Migraine   . Problems with swallowing    intermittently  . Pulmonary fibrosis ([place])   . Shingles   . Stroke [facility])    slurred speech, drawn face, imaging normal, occurred twice, [facility]  . Tracheostomy in place [facility])   . Vocal cord paresis    Past Surgical History:  Procedure Laterality Date  . ABDOMINAL HYSTERECTOMY    . BOTOX INJECTION N/A 07/29/2013   Procedure: BOTOX INJECTION;  Surgeon: Jerene Bears, MD;  Location: [facility];  Service: Gastroenterology;  Laterality: N/A;  . BOTOX INJECTION N/A 05/04/2015   Procedure: BOTOX INJECTION;  Surgeon: Jerene Bears, MD;  Location: [facility];  Service: Gastroenterology;  Laterality: N/A;  . BREAST SURGERY  2002   bx of skin  . CHOLECYSTECTOMY    . COLONOSCOPY    . ESOPHAGEAL MANOMETRY N/A 12/16/2012   Procedure: ESOPHAGEAL MANOMETRY (EM);  Surgeon: Jerene Bears, MD;  Location:  [facility];  Service: Gastroenterology;  Laterality: N/A;  . ESOPHAGOGASTRODUODENOSCOPY (EGD) WITH PROPOFOL N/A 07/29/2013   Procedure: ESOPHAGOGASTRODUODENOSCOPY (EGD) WITH PROPOFOL;  Surgeon: Jerene Bears, MD;  Location: [facility];  Service: Gastroenterology;  Laterality: N/A;  with botox injection  . ESOPHAGOGASTRODUODENOSCOPY (EGD) WITH PROPOFOL N/A 05/04/2015   Procedure: ESOPHAGOGASTRODUODENOSCOPY (EGD) WITH PROPOFOL;  Surgeon: Jerene Bears, MD;  Location: [facility];  Service: Gastroenterology;  Laterality: N/A;  . EYE SURGERY     Catarct surgery 2014  . Eye Surgery AS Child    . JEJUNOSTOMY FEEDING TUBE     x2 both failed. no longer has  . TRACHEOSTOMY  1996   done at [facility], Dr. Kathyrn Sheriff  . TUBAL LIGATION    . VIDEO BRONCHOSCOPY Bilateral 11/20/2012   Procedure: VIDEO BRONCHOSCOPY WITH FLUORO;  Surgeon: Juanito Doom, MD;  Location: [facility];  Service: Cardiopulmonary;  Laterality: Bilateral;   Family History  Problem Relation Age of Onset  . Asthma Cousin   . COPD Cousin   . Breast cancer Maternal Grandmother 60  . Cancer Maternal Grandmother        breast  . Asthma Father   . Cancer Father        lung  . Cancer Paternal Uncle  lung  . COPD Paternal Grandfather   . Breast cancer Maternal Aunt 22  . Cancer Maternal Aunt        breast    Allergies: Influenza vaccines; Shellfish allergy; Quinolones; Asa [aspirin]; Ciprofloxacin; Levaquin [levofloxacin in d5w]; Septra [sulfamethoxazole-trimethoprim]; Adhesive [tape]; and Eggs or egg-derived products Current Outpatient Prescriptions on File Prior to Visit  Medication Sig Dispense Refill  . acetaminophen (TYLENOL) 500 MG tablet Take 500 mg by mouth daily as needed for moderate pain or headache.    . albuterol (PROVENTIL HFA;VENTOLIN HFA) 108 (90 Base) MCG/ACT inhaler Inhale 2 puffs into the lungs every 6 (six) hours as needed for wheezing or shortness of breath. 1 Inhaler 5  . ALPRAZolam (XANAX) 0.25 MG tablet  TAKE 1/2 TABLET BY MOUTH TWICE DAILY AS NEEDED (Patient taking differently: TAKE 1/2 TABLET BY MOUTH TWICE DAILY) 30 tablet 2  . cetirizine (ZYRTEC) 10 MG tablet TAKE ONE TABLET EVERY DAY 30 tablet 2  . chlorpheniramine-HYDROcodone (TUSSIONEX) 10-8 MG/5ML SUER Take 5 mLs by mouth daily as needed for cough.    . clopidogrel (PLAVIX) 75 MG tablet TAKE ONE TABLET EVERY MORNING 90 tablet 0  . cyanocobalamin (,VITAMIN B-12,) 1000 MCG/ML injection Inject 1 mL (1,000 mcg total) into the muscle once. (Patient taking differently: Inject 1,000 mcg into the muscle every 30 (thirty) days. ) 1 mL 0  . EPINEPHrine (EPIPEN 2-PAK) 0.3 mg/0.3 mL IJ SOAJ injection Inject 0.3 mLs (0.3 mg total) into the muscle once. (Patient taking differently: Inject 0.3 mg into the muscle as needed (allergic reaction). ) 1 Device 0  . estradiol (ESTRACE) 1 MG tablet TAKE ONE TABLET EVERY DAY 90 tablet 1  . fluticasone (FLONASE) 50 MCG/ACT nasal spray PLACE 2 SPRAYS INTO BOTH NOSTRILS DAILY. (Patient taking differently: Place 2 sprays into both nostrils daily as needed for allergies. ) 16 g 2  . furosemide (LASIX) 20 MG tablet TAKE ONE TABLET BY MOUTH EVERY DAY AS NEEDED 90 tablet 1  . hydroxypropyl methylcellulose (ISOPTO TEARS) 2.5 % ophthalmic solution Place 1 drop into both eyes 3 (three) times daily as needed for dry eyes.    [name] ibuprofen (ADVIL,MOTRIN) 200 MG tablet Take 800 mg by mouth 2 (two) times daily as needed for mild pain or moderate pain.     [name] ipratropium-albuterol (DUONEB) 0.5-2.5 (3) MG/3ML SOLN Take 3 mLs by nebulization every 4 (four) hours as needed. Dx 496 (Patient taking differently: Take 3 mLs by nebulization every 4 (four) hours as needed (difficulty breathing). ) 360 mL 2  . linaclotide (LINZESS) 72 MCG capsule Take 1 capsule (72 mcg total) by mouth daily before breakfast. (Patient taking differently: Take 72 mcg by mouth daily as needed (ibs symptoms). ) 30 capsule 3  . Liniments (SALONPAS PAIN RELIEF PATCH EX)  Apply 1-2 patches topically daily as needed (shoulder pain).    . montelukast (SINGULAIR) 10 MG tablet Take 10 mg by mouth at bedtime.     . montelukast (SINGULAIR) 10 MG tablet TAKE 1 TABLET (10 MG TOTAL) BY MOUTH DAILY. 90 tablet 1  . mupirocin ointment (BACTROBAN) 2 % Apply 1 application topically daily. To keep trach site from getting irritated    . pantoprazole (PROTONIX) 40 MG tablet Take 1 tablet (40 mg total) by mouth daily. 90 tablet 0  . potassium chloride 20 MEQ/15ML (10%) SOLN Take 15 mLs (20 mEq total) by mouth daily as needed (cramps). 240 mL 0  . Probiotic Product (PROBIOTIC PO) Take 1 tablet by mouth daily as  needed (gi distress).     . sodium chloride (OCEAN) 0.65 % SOLN nasal spray Place 1 spray into both nostrils every 4 (four) hours as needed for congestion.     [name] zolmitriptan (ZOMIG-ZMT) 5 MG disintegrating tablet Take 1 tablet (5 mg total) by mouth daily as needed for migraine. 10 tablet 0   No current facility-administered medications on file prior to visit.     Social History  Substance Use Topics  . Smoking status: Never Smoker  . Smokeless tobacco: Never Used  . Alcohol use No    Review of Systems  Constitutional: Negative for chills and fever.  HENT: Positive for congestion, sinus pain, sinus pressure, sore throat, trouble swallowing (chronic) and voice change. Negative for ear pain.   Respiratory: Positive for cough and wheezing. Negative for shortness of breath.   Cardiovascular: Negative for chest pain and palpitations.  Gastrointestinal: Negative for nausea and vomiting.      Objective:    BP 120/74   Pulse 89   Temp 97.8 F (36.6 C) (Oral)   Ht 5' 3.5" (1.613 m)   Wt 107 lb 6.4 oz (48.7 kg)   SpO2 96%   BMI 18.73 kg/m    Physical Exam  Constitutional: She appears well-developed and well-nourished.  HENT:  Head: Normocephalic and atraumatic.  Right Ear: Hearing, tympanic membrane, external ear and ear canal normal. No drainage, swelling or  tenderness. No foreign bodies. Tympanic membrane is not erythematous and not bulging. No middle ear effusion. No decreased hearing is noted.  Left Ear: Hearing, tympanic membrane, external ear and ear canal normal. No drainage, swelling or tenderness. No foreign bodies. Tympanic membrane is not erythematous and not bulging.  No middle ear effusion. No decreased hearing is noted.  Nose: Nose normal. No rhinorrhea. Right sinus exhibits no maxillary sinus tenderness and no frontal sinus tenderness. Left sinus exhibits no maxillary sinus tenderness and no frontal sinus tenderness.  Mouth/Throat: Uvula is midline, oropharynx is clear and moist and mucous membranes are normal. No oropharyngeal exudate, posterior oropharyngeal edema, posterior oropharyngeal erythema or tonsillar abscesses.  Eyes: Conjunctivae are normal.  Cardiovascular: Regular rhythm, normal heart sounds and normal pulses.   Pulmonary/Chest: Effort normal. She has wheezes in the right middle field, the right lower field, the left middle field and the left lower field. She has no rhonchi. She has no rales.  Lymphadenopathy:       Head (right side): No submental, no submandibular, no tonsillar, no preauricular, no posterior auricular and no occipital adenopathy present.       Head (left side): No submental, no submandibular, no tonsillar, no preauricular, no posterior auricular and no occipital adenopathy present.    She has no cervical adenopathy.  Neurological: She is alert.  Skin: Skin is warm and dry.  Psychiatric: She has a normal mood and affect. Her speech is normal and behavior is normal. Thought content normal.  Vitals reviewed.      Assessment & Plan:   Problem List Items Addressed This Visit      Respiratory   Asthma - Primary    sa02 96. Well appearing and in no acute respiratory distress.Declines neb in office. Also politely declines CXR today as would like to start prednisone taper first. Advised close vigilance and to  let us know if not better.        Relevant Medications   predniSONE (DELTASONE) 10 MG tablet         I am having Ms. Blanch Media start  on predniSONE. I am also having her maintain her ibuprofen, hydroxypropyl methylcellulose / hypromellose, ipratropium-albuterol, sodium chloride, mupirocin ointment, EPINEPHrine, cyanocobalamin, Probiotic Product (PROBIOTIC PO), fluticasone, potassium chloride, linaclotide, montelukast, ALPRAZolam, acetaminophen, chlorpheniramine-HYDROcodone, Liniments (SALONPAS PAIN RELIEF PATCH EX), montelukast, pantoprazole, estradiol, clopidogrel, furosemide, cetirizine, albuterol, and zolmitriptan.   Meds ordered this encounter  Medications  . predniSONE (DELTASONE) 10 MG tablet    Sig: Take 4 tablets ( total 40 mg) by mouth for 2 days; take 3 tablets ( total 30 mg) by mouth for 2 days; take 2 tablets (total 20mg ) by mouth for 2 days; then take 1 tablet ( total 10mg ) by mouth for 2 days.    Dispense:  20 tablet    Refill:  0    Order Specific Question:   Supervising Provider    Answer:   Crecencio Mc [[id number]]    Return precautions given.   Risks, benefits, and alternatives of the medications and treatment plan prescribed today were discussed, and patient expressed understanding.   Education regarding symptom management and diagnosis given to patient on AVS.  Continue to follow with [name], MD for routine health maintenance.   Anne [name] and I agreed with plan.   Mable Paris, FNP

## 2016-09-26 NOTE — Assessment & Plan Note (Signed)
[id number] 96. Well appearing and in no acute respiratory distress.Declines neb in office. Also politely declines CXR today as would like to start prednisone taper first. Advised close vigilance and to let us know if not better.

## 2016-09-28 ENCOUNTER — Encounter: Payer: Self-pay | Admitting: Family Medicine

## 2016-10-02 ENCOUNTER — Other Ambulatory Visit: Payer: Self-pay | Admitting: Family Medicine

## 2016-10-02 ENCOUNTER — Telehealth: Payer: Self-pay | Admitting: Internal Medicine

## 2016-10-02 ENCOUNTER — Other Ambulatory Visit: Payer: Self-pay

## 2016-10-02 DIAGNOSIS — R109 Unspecified abdominal pain: Secondary | ICD-10-CM

## 2016-10-02 NOTE — Telephone Encounter (Signed)
Spoke with pt and she is aware. Orders in for labs, pt knows to call if not better and CT will be scheduled.

## 2016-10-02 NOTE — Telephone Encounter (Signed)
Last OV 09/12/16 last filled 07/06/16 30 2rf

## 2016-10-02 NOTE — Telephone Encounter (Signed)
Would check CBC, CMP and lipase Make sure that she has continued her pantoprazole 40 mg daily, can increase this to BID-AC for 2 weeks, along with Linzess to ensure that she is not constipated Avoid NSAIDs if possible. Would also recommend CT scan abdomen and pelvis with IV contrast if not significant improving in the next 24-48 hours

## 2016-10-02 NOTE — Telephone Encounter (Signed)
Pt states over the weekend she had pain in her stomach, reports it is painful and just doesn't feel good. States she passed a little mucous and pink tinged material yesterday but saw none today. Reports she had some nausea and vomited last night. States she is sore all across her abdomen and back. Pt thinks it feels like some sore of gastritis or colitis. Pt wants to know what Dr. Hilarie Fredrickson suggests. Please advise.

## 2016-10-03 ENCOUNTER — Other Ambulatory Visit (INDEPENDENT_AMBULATORY_CARE_PROVIDER_SITE_OTHER): Payer: Medicare Other

## 2016-10-03 ENCOUNTER — Other Ambulatory Visit: Payer: Self-pay | Admitting: Family Medicine

## 2016-10-03 DIAGNOSIS — R109 Unspecified abdominal pain: Secondary | ICD-10-CM

## 2016-10-03 LAB — CBC WITH DIFFERENTIAL/PLATELET
Basophils Absolute: 0 10*3/uL (ref 0.0–0.1)
Basophils Relative: 1 % (ref 0.0–3.0)
Eosinophils Absolute: 0.3 10*3/uL (ref 0.0–0.7)
Eosinophils Relative: 5.1 % — ABNORMAL HIGH (ref 0.0–5.0)
HCT: 35 % — ABNORMAL LOW (ref 36.0–46.0)
Hemoglobin: 11.9 g/dL — ABNORMAL LOW (ref 12.0–15.0)
Lymphocytes Relative: 30 % (ref 12.0–46.0)
Lymphs Abs: 1.5 10*3/uL (ref 0.7–4.0)
MCHC: 34 g/dL (ref 30.0–36.0)
MCV: 94.9 fl (ref 78.0–100.0)
Monocytes Absolute: 0.5 10*3/uL (ref 0.1–1.0)
Monocytes Relative: 9.9 % (ref 3.0–12.0)
Neutro Abs: 2.7 10*3/uL (ref 1.4–7.7)
Neutrophils Relative %: 54 % (ref 43.0–77.0)
Platelets: 228 10*3/uL (ref 150.0–400.0)
RBC: 3.69 Mil/uL — ABNORMAL LOW (ref 3.87–5.11)
RDW: 13 % (ref 11.5–15.5)
WBC: 5 10*3/uL (ref 4.0–10.5)

## 2016-10-03 LAB — COMPREHENSIVE METABOLIC PANEL
ALT: 11 U/L (ref 0–35)
AST: 17 U/L (ref 0–37)
Albumin: 4 g/dL (ref 3.5–5.2)
Alkaline Phosphatase: 73 U/L (ref 39–117)
BUN: 12 mg/dL (ref 6–23)
CO2: 31 mEq/L (ref 19–32)
Calcium: 9.4 mg/dL (ref 8.4–10.5)
Chloride: 101 mEq/L (ref 96–112)
Creatinine, Ser: 1.01 mg/dL (ref 0.40–1.20)
GFR: 58.1 mL/min — ABNORMAL LOW (ref >60.00)
Glucose, Bld: 98 mg/dL (ref 70–99)
Potassium: 3.6 mEq/L (ref 3.5–5.1)
Sodium: 140 mEq/L (ref 135–145)
Total Bilirubin: 0.5 mg/dL (ref 0.2–1.2)
Total Protein: 6.8 g/dL (ref 6.0–8.3)

## 2016-10-03 LAB — LIPASE: Lipase: 38 U/L (ref 11.0–59.0)

## 2016-10-03 NOTE — Telephone Encounter (Signed)
faxed

## 2016-10-09 ENCOUNTER — Encounter: Payer: Self-pay | Admitting: Internal Medicine

## 2016-10-09 NOTE — Telephone Encounter (Signed)
When I was last contacted by her, I recommended a CT abd/pelvis and I do not see that this was done.  For persistent symptoms, this continues to be my recommendation In the past we have tried Augmentin 500 mg 3 times a day liquid 7-10 days, which can be tried again now

## 2016-10-10 ENCOUNTER — Telehealth: Payer: Self-pay | Admitting: Internal Medicine

## 2016-10-10 ENCOUNTER — Other Ambulatory Visit: Payer: Self-pay

## 2016-10-10 ENCOUNTER — Ambulatory Visit: Payer: Medicare Other

## 2016-10-10 DIAGNOSIS — R109 Unspecified abdominal pain: Secondary | ICD-10-CM

## 2016-10-10 MED ORDER — AMOXICILLIN-POT CLAVULANATE 250-62.5 MG/5ML PO SUSR: 500.0000 mg | Freq: Three times a day (TID) | ORAL | 0 refills | Status: DC

## 2016-10-10 NOTE — Telephone Encounter (Signed)
Pt scheduled for CT of A/P with contrast at [facility] 10/17/16@9am . Pt to be NPO after midnight, drink bottle 1 of contrast at 7am, bottle 2 at 8am. Pt aware and knows to go pick up contrast prior to scan.

## 2016-10-11 ENCOUNTER — Encounter (HOSPITAL_COMMUNITY)
Admission: RE | Admit: 2016-10-11 | Discharge: 2016-10-11 | Disposition: A | Discharge status: Home / Self Care | Payer: Medicare Other | Source: Ambulatory Visit | Attending: Neurological Surgery | Admitting: Neurological Surgery

## 2016-10-11 ENCOUNTER — Encounter: Payer: Self-pay | Admitting: Family Medicine

## 2016-10-11 ENCOUNTER — Encounter (HOSPITAL_COMMUNITY): Payer: Self-pay

## 2016-10-11 DIAGNOSIS — Z01812 Encounter for preprocedural laboratory examination: Secondary | ICD-10-CM | POA: Diagnosis present

## 2016-10-11 LAB — BASIC METABOLIC PANEL
Anion gap: 6 (ref 5–15)
BUN: 10 mg/dL (ref 6–20)
CO2: 28 mmol/L (ref 22–32)
Calcium: 9.3 mg/dL (ref 8.9–10.3)
Chloride: 105 mmol/L (ref 101–111)
Creatinine, Ser: 1.03 mg/dL — ABNORMAL HIGH (ref 0.44–1.00)
GFR calc Af Amer: >60 mL/min (ref >60)
GFR calc non Af Amer: 55 mL/min — ABNORMAL LOW (ref >60)
Glucose, Bld: 94 mg/dL (ref 65–99)
Potassium: 3.3 mmol/L — ABNORMAL LOW (ref 3.5–5.1)
Sodium: 139 mmol/L (ref 135–145)

## 2016-10-11 LAB — CBC
HCT: 37.4 % (ref 36.0–46.0)
Hemoglobin: 12.6 g/dL (ref 12.0–15.0)
MCH: 31.3 pg (ref 26.0–34.0)
MCHC: 33.7 g/dL (ref 30.0–36.0)
MCV: 92.8 fL (ref 78.0–100.0)
Platelets: 221 10*3/uL (ref 150–400)
RBC: 4.03 MIL/uL (ref 3.87–5.11)
RDW: 12.7 % (ref 11.5–15.5)
WBC: 6.7 10*3/uL (ref 4.0–10.5)

## 2016-10-11 LAB — TYPE AND SCREEN
ABO/RH(D): O NEG
Antibody Screen: NEGATIVE

## 2016-10-11 LAB — SURGICAL PCR SCREEN
MRSA, PCR: NEGATIVE
Staphylococcus aureus: NEGATIVE

## 2016-10-11 NOTE — Progress Notes (Addendum)
PCP - Dr. [name] Cardiologist - Dr. Fletcher Anon  Chest x-ray - 09/12/2016 EKG - 08/08/2016 Stress Test - 02/03/2014 ECHO - 02/16/2014 Cardiac Cath - patient denies  Sleep Study - patient denies   Patient denies shortness of breath, fever, cough and chest pain at PAT appointment   Patient verbalized understanding of instructions that were given to them at the PAT appointment. Patient was also instructed that they will need to review over the PAT instructions again at home before surgery.   Per patient last dose of Plavix was today 10/11/2016

## 2016-10-11 NOTE — Pre-Procedure Instructions (Signed)
Anne [name]  10/11/2016      [facility] - [place], [place] [address] Phone: [phone] Fax: [phone]    Your procedure is scheduled on Thursday, October 19, 2016.  Report to [facility] Admitting at 5:30 A.M.  Call this number if you have problems the morning of surgery:  [phone]   Remember:  Do not eat food or drink liquids after midnight.  Take these medicines the morning of surgery with A SIP OF WATER:  Acetaminophen (Tylenol) - if needed Albuterol inhaler - if needed Alprazolam (xanax) - if needed Cetirizine (Zyrtec) Chlorpheniramine-Hydrocodone (Tussionex) - if needed Estradiol (Estrace) Fluticasone (Flonase) - if needed Hydroxypropyl methylcellulose (Isopto tears) - if needed Ipratropium-albuterol (Duoneb) - if needed Linaclotide (Linzess) - if needed Montelukast (Singulair) Pantoprazole (Protonix)    7 days prior to surgery STOP taking any Aspirin, Aleve, Naproxen, Ibuprofen, Motrin, Advil, Goody's, BC's, all herbal medications, fish oil, and all vitamins  Plavix - last dose on 10/11/2016, per medical doctor    Do not wear jewelry, make-up or nail polish.  Do not wear lotions, powders, or perfumes, or deodorant.  Do not shave 48 hours prior to surgery.  Men may shave face and neck.  Do not bring valuables to the hospital.  [facility] is not responsible for any belongings or valuables.  Contacts, dentures or bridgework may not be worn into surgery.  Leave your suitcase in the car.  After surgery it may be brought to your room.  For patients admitted to the hospital, discharge time will be determined by your treatment team.  Patients discharged the day of surgery will not be allowed to drive home.   Name and phone number of your driver:    Special instructions:   [facility]- Preparing For Surgery  Before surgery, you can play an important role. Because skin is not sterile, your  skin needs to be as free of germs as possible. You can reduce the number of germs on your skin by washing with CHG (chlorahexidine gluconate) Soap before surgery.  CHG is an antiseptic cleaner which kills germs and bonds with the skin to continue killing germs even after washing.  Please do not use if you have an allergy to CHG or antibacterial soaps. If your skin becomes reddened/irritated stop using the CHG.  Do not shave (including legs and underarms) for at least 48 hours prior to first CHG shower. It is OK to shave your face.  Please follow these instructions carefully.   1. Shower the NIGHT BEFORE SURGERY and the MORNING OF SURGERY with CHG.   2. If you chose to wash your hair, wash your hair first as usual with your normal shampoo.  3. After you shampoo, rinse your hair and body thoroughly to remove the shampoo.  4. Use CHG as you would any other liquid soap. You can apply CHG directly to the skin and wash gently with a scrungie or a clean washcloth.   5. Apply the CHG Soap to your body ONLY FROM THE NECK DOWN.  Do not use on open wounds or open sores. Avoid contact with your eyes, ears, mouth and genitals (private parts). Wash genitals (private parts) with your normal soap.  6. Wash thoroughly, paying special attention to the area where your surgery will be performed.  7. Thoroughly rinse your body with warm water from the neck down.  8. DO NOT shower/wash with your  normal soap after using and rinsing off the CHG Soap.  9. Pat yourself dry with a CLEAN TOWEL.   10. Wear CLEAN PAJAMAS   11. Place CLEAN SHEETS on your bed the night of your first shower and DO NOT SLEEP WITH PETS.    Day of Surgery: shower with surgical scrub as stated above Do not apply any deodorants/lotions. Please wear clean clothes to the hospital/surgery center.      Please read over the following fact sheets that you were given. Pain Booklet, Coughing and Deep Breathing, MRSA Information and Surgical  Site Infection Prevention

## 2016-10-12 ENCOUNTER — Ambulatory Visit: Payer: Medicare Other

## 2016-10-12 ENCOUNTER — Other Ambulatory Visit: Payer: Self-pay

## 2016-10-12 ENCOUNTER — Ambulatory Visit
Admission: RE | Admit: 2016-10-12 | Discharge: 2016-10-12 | Disposition: A | Discharge status: Home / Self Care | Payer: Medicare Other | Source: Ambulatory Visit | Attending: Internal Medicine | Admitting: Internal Medicine

## 2016-10-12 ENCOUNTER — Ambulatory Visit (INDEPENDENT_AMBULATORY_CARE_PROVIDER_SITE_OTHER): Payer: Medicare Other | Admitting: *Deleted

## 2016-10-12 DIAGNOSIS — E538 Deficiency of other specified B group vitamins: Secondary | ICD-10-CM | POA: Diagnosis not present

## 2016-10-12 DIAGNOSIS — R109 Unspecified abdominal pain: Secondary | ICD-10-CM | POA: Diagnosis not present

## 2016-10-12 DIAGNOSIS — N2889 Other specified disorders of kidney and ureter: Secondary | ICD-10-CM

## 2016-10-12 MED ORDER — IOPAMIDOL (ISOVUE-300) INJECTION 61%
75.0000 mL | Freq: Once | INTRAVENOUS | Status: AC | PRN
  Administered 2016-10-12: 75 mL via INTRAVENOUS

## 2016-10-12 MED ORDER — CYANOCOBALAMIN 1000 MCG/ML IJ SOLN
1000.0000 ug | Freq: Once | INTRAMUSCULAR | Status: AC
  Administered 2016-10-12: 1000 ug via INTRAMUSCULAR

## 2016-10-12 NOTE — Progress Notes (Signed)
Care was provided under my supervision. I agree with the management as indicated in the note.  Airam Runions DO  

## 2016-10-12 NOTE — Progress Notes (Addendum)
Anesthesia Follow-up: Patient is a 66 year old female seen in consultation by myself and anesthesiologist Dr. Conrad Westcliffe on 08/08/16 due to chronic tracheostomy, pulmonary fibrosis. She was scheduled for laminectomy and foraminotomy C4-5, C5-6, posterior cervical instrumented fusion C3-7 on 08/16/16 by Dr. Sherley Bounds, but case was ultimately postponed due to asthma exacerbation requiring prednisone taper. Surgery has now been rescheduled for 10/19/16 (first case).  As per my previous note: Surgeon and patient have requested an ANESTHESIOLOGIST consult (re: tracheostomy '96, pulmonary fibrosis, COPD, asthma, vocal cord dysfunction and airway/positioning recommendations for surgery in prone position).   History includes non-smoker, asthma, COPD, pulmonary fibrosis, left vocal cord "mechanical" dysfunction, tracheostomy '96 (#4 Shilley; placed for recurrent upper airway obstruction related to asthma and left vocal cord "mechanical" dysfunction), childhood murmur, anemia, migraines, suspected TIA (~ '98 left facial droop and ~ '03 dysarthria; reported MRI negative), cervical degenerative disc disease, IBS, hysterectomy, jejunostomy feeding tube (doesn't have any longer), dysphagia (eats small amount, little to no meat), achalasia (type III or spastic), video bronchoscopy (negative fungal cultures) 11/20/12. She was started on Augmentin 10/10/16 for possible gastritis or colitis. CT on 10/12/16 pending.  - PCP is Dr. Tommi Rumps. - Pulmonologist is [facility]. She was evaluated by Dr. Merton Border on 07/21/16 for pre-operative pulmonary evaluation (previously had seen Dr. Simonne Maffucci). 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 She tried off of Singulair and feels that her asthma control is better while on that medication. 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 The presence of the tracheostomy tube will create some challenges perioperatively. It will have to be changed out to a cuffed tube for ventilatory support intraoperatively. It might create some problems with patient positioning as she notes that she will have to be in a prone position for the surgery. On the other hand, it will provide reliable airway support and access postoperatively and will potentiallyreduce the risk of any perioperative airway complications. Overall, I judge her risk of perioperative pulmonary complications to be only mildly increased compared to a population with no underlying lung disease. There are no interventions to be undertaken at this time that might further mitigate that risk  PLAN: 1) Continue current medical regimen and trach tube care including montelukast and PRN albuterol 2) Proceed with surgery as planned. Postoperatively, she should receive scheduled nebulized bronchodilators for the first 24-48 hours. Pulmonary/critical care medicine consultation postoperatively should be considered 3) Follow up in 3-41months   - GI is Dr. Zenovia Jarred. - ENT is Dr. Ermalinda Barrios. - She was evaluated by cardiologist Dr. Kathlyn Sacramento in the fall of 2015 for atypical chest pain. She had a stress (followed by echo to confirm EF) with results below.  Meds include albuterol, Xanax, Augmentin 500 mg TID X 7-10 days (started 10/10/16), Zyrtec, Tussionex, Plavix (last dose 10/11/16), estradiol, Flonase, Lasix, Duoneb, Linzess, Salonpas Pain Relief Patch, Singulair, Protonix, KCl, Zomig-ZMT.    BP (!) 118/54   Pulse 88   Temp 36.4 C   Resp 18   Ht 5' 3.5" (1.613 m)   Wt 108 lb 12.8 oz (49.4 kg)   SpO2 99%   BMI 18.97 kg/m  By not last note, she has a #4 Shilley tache,  capped. Voice soft, but overall good quality. She reports being able to sing now.   EKG 08/08/16: NSR, septal infarct (age undetermined). She has had septal infarct cited on EKGs dating back to at least 02/26/12.   Nuclear stress test 02/03/14 [facility]): Summary:  1. Pharmacological myocardial perfusion study with no significant ischemia. 2. No significant wall motion abnormality noted. 3. The LV global function was abnormal. 4. EF 37%. 5. There are EKG changes concerning for ischemia at peak stress, 1 mm depressions in V4-6. 6. There is GI uptake artifact noted on this study. 7. Overall, this is a Low risk scan. 8. Consider an echocardiogram to confirm depressed EF.  Echo 02/16/14: Study Conclusions - Left ventricle: The cavity size was normal. Systolic function was normal. The estimated ejection fraction was in the range of 60% to 65%. Wall motion was normal; there were no regional wall motion abnormalities. Left ventricular diastolic function parameters were normal. - Aortic valve: There was mild regurgitation. - Mitral valve: There was mild regurgitation. - Right ventricle: Systolic function was normal. - Pulmonary arteries: Systolic pressure was within the normal range. Impressions: - Otherwise a normal study.  Carotid U/S 07/05/04: IMPRESSION:  1)There is noted very slight calcific plaque formation on the RIGHT.  2)No hemodynamically significant stenosis is seen on either side.  3)Antegrade flow is noted in both vertebrals.   CXR 09/22/16: IMPRESSION: 1. Stable chronic change with scarring particular in the upper lobes and apices. 2. No definite active process.  CT Chest High Resolution 03/01/16: IMPRESSION: 1. Extensive patchy subpleural reticulation, traction bronchiectasis, volume loss, distortion and pleural thickening relatively symmetrically and almost exclusively involving the upper lobes with associated superior hilar retraction. No frank honeycombing.  Findings have progressed since 2014. 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 2. Mild patchy tree-in-bud opacities in the basilar lower lobes, similar to but less prominent than the 08/08/2012 chest CT, favoring a mild infectious or inflammatory bronchiolitis with the differential including mild aspiration.  CT abd/pelvis 10/12/16:IMPRESSION:    - No acute findings within the abdomen or pelvis.   - 11 mm lesion in posterior upper pole of left kidney with probable contrast enhancement, suspicious for small renal cell carcinoma. Abdomen MRI without and with contrast recommended for further characterization.  Preoperative labs noted. K 3.3. Cr 1.03. Glucose 94. CBC WNL. PT/INR was normal on 08/08/16.   Dr. Conrad [place] previously discussed with patient to expect that her #4 Ralene Ok would be temporarily exchanged with a larger ETT (at least 6.0) during surgery and secured for prone positioning. Post-operatively she would ultimately get a #4 Shilley replaced but due to larger ETT size, she could have a temporary leak and may require a cuffed trach and/or Cortisone injection. Dr. Alva Garnet recommendations also include scheduled nebulized bronchodilators for the first 24-48 hours and consideration of PCCM consult post-operatively.   CT abd/pelvis today showed left kidney nodule suspicious for renal carcinoma. Dr. Hilarie Fredrickson has ordered an MRI (ordered for 10/16/16). Will await results and recommendations.  George Hugh [facility] Short Stay Center/Anesthesiology Phone [phone] 10/12/2016 5:02 PM  Addendum: Dr. Hilarie Fredrickson is referring patient to urology ASAP given MRI results (see below).   Abdominal MRI 10/16/16: IMPRESSION: 12 mm lesion in the upper pole the left kidney somewhat ill-defined and has apparent enhancement after IV contrast administration. Lesion is suspicious for neoplasm. No Evidence for lymphadenopathy in the abdomen.  George Hugh [facility] Short Stay Center/Anesthesiology Phone [phone] 10/16/2016 3:54 PM

## 2016-10-12 NOTE — Progress Notes (Signed)
Patient presented for B 12 injection to right deltoid, patient voiced no concerns nor showed any signs of distress during injection. 

## 2016-10-16 ENCOUNTER — Telehealth: Payer: Self-pay

## 2016-10-16 ENCOUNTER — Other Ambulatory Visit: Payer: Self-pay

## 2016-10-16 ENCOUNTER — Ambulatory Visit
Admission: RE | Admit: 2016-10-16 | Discharge: 2016-10-16 | Disposition: A | Discharge status: Home / Self Care | Payer: Medicare Other | Source: Ambulatory Visit | Attending: Internal Medicine | Admitting: Internal Medicine

## 2016-10-16 DIAGNOSIS — N2889 Other specified disorders of kidney and ureter: Secondary | ICD-10-CM

## 2016-10-16 DIAGNOSIS — N289 Disorder of kidney and ureter, unspecified: Secondary | ICD-10-CM

## 2016-10-16 MED ORDER — GADOBENATE DIMEGLUMINE 529 MG/ML IV SOLN
10.0000 mL | Freq: Once | INTRAVENOUS | Status: AC | PRN
  Administered 2016-10-16: 9 mL via INTRAVENOUS

## 2016-10-16 NOTE — Telephone Encounter (Signed)
Discussed the results of the MRI with the patient. See imaging results. Referred to [facility] per patient request for the kidney lesion.

## 2016-10-16 NOTE — Telephone Encounter (Signed)
Pt has seen the MRI report in Mychart. She has lots of question about getting in to see urology. She also wants to know if she should cancel her neck surgery until after the kidney issue is resolved. Please call the pt on her home phone number.

## 2016-10-17 ENCOUNTER — Encounter: Payer: Self-pay | Admitting: Family Medicine

## 2016-10-17 ENCOUNTER — Ambulatory Visit: Admission: RE | Admit: 2016-10-17 | Payer: Medicare Other | Source: Ambulatory Visit

## 2016-10-19 ENCOUNTER — Inpatient Hospital Stay (HOSPITAL_COMMUNITY): Admission: RE | Admit: 2016-10-19 | Payer: Medicare Other | Source: Ambulatory Visit | Admitting: Neurological Surgery

## 2016-10-19 ENCOUNTER — Other Ambulatory Visit: Payer: Self-pay | Admitting: Family Medicine

## 2016-10-19 ENCOUNTER — Encounter: Payer: Self-pay | Admitting: Urology

## 2016-10-19 ENCOUNTER — Encounter (HOSPITAL_COMMUNITY): Admission: RE | Payer: Self-pay | Source: Ambulatory Visit

## 2016-10-19 ENCOUNTER — Ambulatory Visit (INDEPENDENT_AMBULATORY_CARE_PROVIDER_SITE_OTHER): Payer: Medicare Other | Admitting: Urology

## 2016-10-19 VITALS — BP 115/65 | HR 78 | Ht 63.5 in | Wt 105.5 lb

## 2016-10-19 DIAGNOSIS — N2889 Other specified disorders of kidney and ureter: Secondary | ICD-10-CM | POA: Diagnosis not present

## 2016-10-19 DIAGNOSIS — E876 Hypokalemia: Secondary | ICD-10-CM

## 2016-10-19 LAB — URINALYSIS, COMPLETE
Bilirubin, UA: NEGATIVE
Glucose, UA: NEGATIVE
Ketones, UA: NEGATIVE
Nitrite, UA: NEGATIVE
Protein, UA: NEGATIVE
Specific Gravity, UA: 1.015 (ref 1.005–1.030)
Urobilinogen, Ur: 0.2 mg/dL (ref 0.2–1.0)
pH, UA: 6 (ref 5.0–7.5)

## 2016-10-19 LAB — MICROSCOPIC EXAMINATION

## 2016-10-19 SURGERY — POSTERIOR CERVICAL FUSION/FORAMINOTOMY LEVEL 4
Anesthesia: General

## 2016-10-19 NOTE — Progress Notes (Signed)
10/19/2016 11:18 AM   [name] 1950/06/02 [id number]  Referring provider: [name], MD [address] [place], [place] [address]  Chief Complaint  Patient presents with  . Nephrolithiasis    HPI: The patient is a 66 year old female with past metal history significant for CVA, COPD, and pulmonary fibrosis presents today for evaluation of a 1.1 cm posterior upper left renal mass with possible enhancement. This was found incidentally on a CT scan performed for abdominal pain. It was also reimaged on MRI with similar findings. There is a possible slight enhancement but this is difficult to completely confirm due to the small size. The patient has no personal history of genitourinary malignancy. She voids well. No gross hematuria. The patient has never smoked. She does have a spinal cord impingement or cervical spine is scheduled to undergo surgery for this in the near future.   PMH: Past Medical History:  Diagnosis Date  . Allergic rhinitis   . Anemia   . Asthma   . Complication of anesthesia    Breathing problems. 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  . Compressed cervical disc   . COPD (chronic obstructive pulmonary disease) ([place])   . CVA (cerebral infarction)   . Dyspnea   . Esophageal dysmotility   . Heart murmur    as child  . History of hiatal hernia   . IBS (irritable bowel syndrome)   . Migraine   . Problems with swallowing    intermittently  . Pulmonary fibrosis ([place])   . Shingles   . Stroke [facility])    slurred speech, drawn face, imaging normal, occurred twice, [facility]  . Tracheostomy in place [facility])   . Vocal cord paresis     Surgical History: Past Surgical History:  Procedure Laterality Date  . ABDOMINAL HYSTERECTOMY    . BOTOX INJECTION N/A 07/29/2013   Procedure: BOTOX INJECTION;  Surgeon: Jerene Bears, MD;  Location: [facility];  Service: Gastroenterology;  Laterality: N/A;  . BOTOX INJECTION N/A 05/04/2015   Procedure: BOTOX INJECTION;   Surgeon: Jerene Bears, MD;  Location: [facility];  Service: Gastroenterology;  Laterality: N/A;  . BREAST SURGERY  2002   bx of skin  . CHOLECYSTECTOMY    . COLONOSCOPY    . ESOPHAGEAL MANOMETRY N/A 12/16/2012   Procedure: ESOPHAGEAL MANOMETRY (EM);  Surgeon: Jerene Bears, MD;  Location: [facility];  Service: Gastroenterology;  Laterality: N/A;  . ESOPHAGOGASTRODUODENOSCOPY (EGD) WITH PROPOFOL N/A 07/29/2013   Procedure: ESOPHAGOGASTRODUODENOSCOPY (EGD) WITH PROPOFOL;  Surgeon: Jerene Bears, MD;  Location: [facility];  Service: Gastroenterology;  Laterality: N/A;  with botox injection  . ESOPHAGOGASTRODUODENOSCOPY (EGD) WITH PROPOFOL N/A 05/04/2015   Procedure: ESOPHAGOGASTRODUODENOSCOPY (EGD) WITH PROPOFOL;  Surgeon: Jerene Bears, MD;  Location: [facility];  Service: Gastroenterology;  Laterality: N/A;  . EYE SURGERY     Catarct surgery 2014  . Eye Surgery AS Child    . JEJUNOSTOMY FEEDING TUBE     x2 both failed. no longer has  . MULTIPLE TOOTH EXTRACTIONS     2 teeth removed  . TRACHEOSTOMY  1996   done at [facility], Dr. Kathyrn Sheriff  . TUBAL LIGATION    . VIDEO BRONCHOSCOPY Bilateral 11/20/2012   Procedure: VIDEO BRONCHOSCOPY WITH FLUORO;  Surgeon: Juanito Doom, MD;  Location: [facility];  Service: Cardiopulmonary;  Laterality: Bilateral;    Home Medications:  Allergies as of 10/19/2016      Reactions   Influenza Vaccines Shortness Of Breath  Also causes her to have achy, flu-like symptoms   Shellfish Allergy Anaphylaxis   Quinolones    Feet swell   Asa [aspirin]    Tongue swelling   Ciprofloxacin Swelling   ALL OF THE "FLOXACIN"   Levaquin [levofloxacin In D5w]    Aches and swelling   Septra [sulfamethoxazole-trimethoprim] Diarrhea   Adhesive [tape] Rash   Paper works fine    Eggs Or Egg-derived Products    GI Upset      Medication List       Accurate as of 10/19/16 11:18 AM. Always use your most recent med list.          acetaminophen 500 MG tablet Commonly  known as:  TYLENOL Take 500 mg by mouth daily as needed for moderate pain or headache.   albuterol 108 (90 Base) MCG/ACT inhaler Commonly known as:  PROVENTIL HFA;VENTOLIN HFA Inhale 2 puffs into the lungs every 6 (six) hours as needed for wheezing or shortness of breath.   ALPRAZolam 0.25 MG tablet Commonly known as:  XANAX ONE-HALF TABLET BY MOUTH TWICE DAILY AS NEEDED   cetirizine 10 MG tablet Commonly known as:  ZYRTEC TAKE ONE TABLET EVERY DAY   chlorpheniramine-HYDROcodone 10-8 MG/5ML Suer Commonly known as:  TUSSIONEX Take 5 mLs by mouth daily as needed for cough.   clopidogrel 75 MG tablet Commonly known as:  PLAVIX TAKE ONE TABLET EVERY MORNING   cyanocobalamin 1000 MCG/ML injection Commonly known as:  (VITAMIN B-12) Inject 1 mL (1,000 mcg total) into the muscle once.   EPINEPHrine 0.3 mg/0.3 mL Soaj injection Commonly known as:  EPIPEN 2-PAK Inject 0.3 mLs (0.3 mg total) into the muscle once.   estradiol 1 MG tablet Commonly known as:  ESTRACE TAKE ONE TABLET EVERY DAY   fluticasone 50 MCG/ACT nasal spray Commonly known as:  FLONASE PLACE 2 SPRAYS INTO BOTH NOSTRILS DAILY.   furosemide 20 MG tablet Commonly known as:  LASIX TAKE ONE TABLET BY MOUTH EVERY DAY AS NEEDED   hydroxypropyl methylcellulose / hypromellose 2.5 % ophthalmic solution Commonly known as:  ISOPTO TEARS / GONIOVISC Place 1 drop into both eyes 3 (three) times daily as needed for dry eyes.   ibuprofen 200 MG tablet Commonly known as:  ADVIL,MOTRIN Take 800 mg by mouth 2 (two) times daily as needed for mild pain or moderate pain.   ipratropium-albuterol 0.5-2.5 (3) MG/3ML Soln Commonly known as:  DUONEB Take 3 mLs by nebulization every 4 (four) hours as needed. Dx 496   linaclotide 72 MCG capsule Commonly known as:  LINZESS Take 1 capsule (72 mcg total) by mouth daily before breakfast.   montelukast 10 MG tablet Commonly known as:  SINGULAIR Take 10 mg by mouth at bedtime.     mupirocin ointment 2 % Commonly known as:  BACTROBAN Apply 1 application topically daily. To keep trach site from getting irritated   pantoprazole 40 MG tablet Commonly known as:  PROTONIX Take 1 tablet (40 mg total) by mouth daily.   potassium chloride 20 MEQ/15ML (10%) Soln Take 15 mLs (20 mEq total) by mouth daily as needed (cramps).   predniSONE 10 MG tablet Commonly known as:  DELTASONE Take 4 tablets ( total 40 mg) by mouth for 2 days; take 3 tablets ( total 30 mg) by mouth for 2 days; take 2 tablets (total 20mg ) by mouth for 2 days; then take 1 tablet ( total 10mg ) by mouth for 2 days.   PROBIOTIC PO Take 1 tablet by mouth  daily as needed (gi distress).   SALONPAS PAIN RELIEF PATCH EX Apply 1-2 patches topically daily as needed (shoulder pain).   sodium chloride 0.65 % Soln nasal spray Commonly known as:  OCEAN Place 1 spray into both nostrils every 4 (four) hours as needed for congestion.   zolmitriptan 5 MG disintegrating tablet Commonly known as:  ZOMIG-ZMT Take 1 tablet (5 mg total) by mouth daily as needed for migraine.       Allergies:  Allergies  Allergen Reactions  . Influenza Vaccines Shortness Of Breath    Also causes her to have achy, flu-like symptoms  . Shellfish Allergy Anaphylaxis  . Quinolones     Feet swell  . Asa [Aspirin]     Tongue swelling  . Ciprofloxacin Swelling    ALL OF THE "FLOXACIN"  . Levaquin [Levofloxacin In D5w]     Aches and swelling  . Septra [Sulfamethoxazole-Trimethoprim] Diarrhea  . Adhesive [Tape] Rash    Paper works fine   . Eggs Or Egg-Derived Products     GI Upset    Family History: Family History  Problem Relation Age of Onset  . Asthma Cousin   . COPD Cousin   . Breast cancer Maternal Grandmother 60  . Cancer Maternal Grandmother        breast  . Asthma Father   . Cancer Father        lung  . Kidney cancer Father   . Cancer Paternal Uncle        lung  . COPD Paternal Grandfather   . Breast cancer  Maternal Aunt 75  . Cancer Maternal Aunt        breast  . Bladder Cancer Neg Hx     Social History:  reports that she has never smoked. She has never used smokeless tobacco. She reports that she does not drink alcohol or use drugs.  ROS: UROLOGY Frequent Urination?: Yes Hard to postpone urination?: No Burning/pain with urination?: No Get up at night to urinate?: No Leakage of urine?: Yes Urine stream starts and stops?: No Trouble starting stream?: No Do you have to strain to urinate?: No Blood in urine?: No Urinary tract infection?: No Sexually transmitted disease?: No Injury to kidneys or bladder?: No Painful intercourse?: No Weak stream?: No Currently pregnant?: No Vaginal bleeding?: No Last menstrual period?: n  Gastrointestinal Nausea?: Yes Vomiting?: Yes Indigestion/heartburn?: No Diarrhea?: Yes Constipation?: Yes  Constitutional Fever: No Night sweats?: No Weight loss?: No Fatigue?: Yes  Skin Skin rash/lesions?: No Itching?: No  Eyes Blurred vision?: No Double vision?: No  Ears/Nose/Throat Sore throat?: No Sinus problems?: Yes  Hematologic/Lymphatic Swollen glands?: No Easy bruising?: Yes  Cardiovascular Leg swelling?: No Chest pain?: No  Respiratory Cough?: Yes Shortness of breath?: Yes  Endocrine Excessive thirst?: No  Musculoskeletal Back pain?: Yes Joint pain?: Yes  Neurological Headaches?: Yes Dizziness?: No  Psychologic Depression?: No Anxiety?: No  Physical Exam: BP 115/65 (BP Location: Left Arm, Patient Position: Sitting, Cuff Size: Normal)   Pulse 78   Ht 5' 3.5" (1.613 m)   Wt 105 lb 8 oz (47.9 kg)   BMI 18.40 kg/m   Constitutional:  Alert and oriented, No acute distress. HEENT: [place] AT, moist mucus membranes.  Trachea midline, no masses. Cardiovascular: No clubbing, cyanosis, or edema. Respiratory: Normal respiratory effort, no increased work of breathing. GI: Abdomen is soft, nontender, nondistended, no  abdominal masses GU: No CVA tenderness.  Skin: No rashes, bruises or suspicious lesions. Lymph: No cervical or inguinal  adenopathy. Neurologic: Grossly intact, no focal deficits, moving all 4 extremities. Psychiatric: Normal mood and affect.  Laboratory Data: Lab Results  Component Value Date   WBC 6.7 10/11/2016   HGB 12.6 10/11/2016   HCT 37.4 10/11/2016   MCV 92.8 10/11/2016   PLT 221 10/11/2016    Lab Results  Component Value Date   CREATININE 1.03 (H) 10/11/2016    No results found for: PSA  No results found for: TESTOSTERONE  Lab Results  Component Value Date   HGBA1C 5.9 (H) 04/30/2013    Urinalysis    Component Value Date/Time   BILIRUBINUR neg 12/15/2015 0957   PROTEINUR neg 12/15/2015 0957   UROBILINOGEN 0.2 12/15/2015 0957   NITRITE neg 12/15/2015 0957   LEUKOCYTESUR Negative 12/15/2015 0957    Pertinent Imaging: CT and MRI are reviewed as above.  Assessment & Plan:    1. 1.1 cm left upper pole renal mass I discussed the patient that her masses quite small and to some degree   too small to fully characterize it enhances. We did discuss the American urological Association guidelines which suggests active surveillance for renal masses under 3 cm. We discussed this as well as surgical removal. This time the patient would like to monitor the size of the mass with active surveillance. We will repeat her MRI in 6 months to ensure stability. If it is stable, we can consider alternating between ultrasound and CT to monitor this mass. She is undergoing cervical spine surgery in the near future. If she does have MRI noncompatible screws placed, we will have her undergo a CT renal mass protocol instead.   Return in about 6 months (around 04/21/2017) for with MRI prior.  Nickie Retort, MD  [facility] [address], [place], [place] [address] [phone]

## 2016-10-20 ENCOUNTER — Telehealth: Payer: Self-pay | Admitting: Internal Medicine

## 2016-10-20 NOTE — Telephone Encounter (Signed)
See message from the patient 

## 2016-10-23 ENCOUNTER — Telehealth: Payer: Self-pay | Admitting: Urology

## 2016-10-23 ENCOUNTER — Telehealth: Payer: Self-pay | Admitting: Family Medicine

## 2016-10-23 ENCOUNTER — Other Ambulatory Visit (INDEPENDENT_AMBULATORY_CARE_PROVIDER_SITE_OTHER): Payer: Medicare Other

## 2016-10-23 DIAGNOSIS — Z5181 Encounter for therapeutic drug level monitoring: Secondary | ICD-10-CM

## 2016-10-23 DIAGNOSIS — E876 Hypokalemia: Secondary | ICD-10-CM

## 2016-10-23 LAB — POTASSIUM: Potassium: 3.2 mEq/L — ABNORMAL LOW (ref 3.5–5.1)

## 2016-10-23 NOTE — Telephone Encounter (Signed)
Pt called office stating that she can see her lab results, what Dr. Pilar Jarvis says about the labs, but she is unable to correspond with Dr. Pilar Jarvis or clinical staff.  Pt wants to know if she has an infection and if so, what's the protocol to get treatment, asking if she should contact her PCP or is someone from this office to get Rx. States she hasn't heard from anyone at this office and is unfamiliar with how we address these types of situations. Please advise. Thanks.

## 2016-10-23 NOTE — Telephone Encounter (Signed)
Spoke with patient regarding low potassium. She reports she has been taking Lasix daily for swelling. She is not taking a potassium supplement with this. Discussed this is likely the reason for her low potassium. I advised her to take her liquid potassium supplement 20 mEq daily when she is taking her Lasix. We'll recheck her potassium and kidney function in 1 week. We will have nursing call the patient to get this set up.

## 2016-10-23 NOTE — Telephone Encounter (Signed)
Spoke with pt in reference to u/a performed at [facility]. Pt voiced understanding.

## 2016-10-24 ENCOUNTER — Ambulatory Visit: Payer: Medicare Other | Admitting: Family Medicine

## 2016-10-24 NOTE — Telephone Encounter (Signed)
Patient is scheduled for 1 week lab

## 2016-10-28 ENCOUNTER — Other Ambulatory Visit: Payer: Self-pay | Admitting: Internal Medicine

## 2016-10-30 ENCOUNTER — Other Ambulatory Visit (INDEPENDENT_AMBULATORY_CARE_PROVIDER_SITE_OTHER): Payer: Medicare Other

## 2016-10-30 DIAGNOSIS — Z5181 Encounter for therapeutic drug level monitoring: Secondary | ICD-10-CM

## 2016-10-30 LAB — BASIC METABOLIC PANEL
BUN: 11 mg/dL (ref 7–25)
CO2: 26 mmol/L (ref 20–32)
Calcium: 9.2 mg/dL (ref 8.6–10.4)
Chloride: 104 mmol/L (ref 98–110)
Creat: 0.94 mg/dL (ref 0.50–0.99)
Glucose, Bld: 88 mg/dL (ref 65–99)
Potassium: 3.4 mmol/L — ABNORMAL LOW (ref 3.5–5.3)
Sodium: 141 mmol/L (ref 135–146)

## 2016-10-30 NOTE — Addendum Note (Signed)
Addended by: Arby Barrette on: 10/30/2016 09:59 AM   Modules accepted: Orders

## 2016-11-01 ENCOUNTER — Other Ambulatory Visit: Payer: Self-pay | Admitting: Neurological Surgery

## 2016-11-02 ENCOUNTER — Encounter: Payer: Self-pay | Admitting: Family Medicine

## 2016-11-06 ENCOUNTER — Telehealth: Payer: Self-pay | Admitting: Internal Medicine

## 2016-11-06 NOTE — Telephone Encounter (Signed)
I am comfortable with [facility] and would be comfortable with the recommendation for her That said, she is always entitled to have a second opinion. If she is interested we can refer her for a second opinion regarding her renal lesion. Ultimately she needs to be happy and at peace with the plan and if a second opinion yields this for her then she should seek that opinion.

## 2016-11-06 NOTE — Telephone Encounter (Signed)
Patient notified of the recommendations She will wait for now.  Thanks all for the calls.

## 2016-11-06 NOTE — Telephone Encounter (Signed)
Left message for patient to call back  

## 2016-11-06 NOTE — Telephone Encounter (Signed)
Patient is concerned about recommendations from Urology.  She has been advised to wait 6 months to have renal lesion reevaluated. She would like to get your opinion on waiting or should she see someone else? She would like your opinion.

## 2016-11-09 ENCOUNTER — Encounter: Payer: Self-pay | Admitting: Pulmonary Disease

## 2016-11-09 ENCOUNTER — Ambulatory Visit (INDEPENDENT_AMBULATORY_CARE_PROVIDER_SITE_OTHER): Payer: Medicare Other | Admitting: Pulmonary Disease

## 2016-11-09 VITALS — BP 140/66 | HR 72 | Ht 63.5 in | Wt 106.0 lb

## 2016-11-09 DIAGNOSIS — J4530 Mild persistent asthma, uncomplicated: Secondary | ICD-10-CM

## 2016-11-09 DIAGNOSIS — R05 Cough: Secondary | ICD-10-CM

## 2016-11-09 DIAGNOSIS — Z930 Tracheostomy status: Secondary | ICD-10-CM | POA: Diagnosis not present

## 2016-11-09 DIAGNOSIS — R053 Chronic cough: Secondary | ICD-10-CM

## 2016-11-09 MED ORDER — HYDROCOD POLST-CPM POLST ER 10-8 MG/5ML PO SUER: 5.0000 mL | Freq: Every evening | ORAL | 0 refills | Status: DC | PRN

## 2016-11-09 NOTE — Patient Instructions (Signed)
Continue Singulair Continue albuterol inhaler as first line rescue therapy Continue DuoNeb second line rescue therapy Tussionex refilled Follow-up in 6 months

## 2016-11-11 NOTE — Progress Notes (Signed)
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 No frank honeycombing. Findings have progressed since  2014. 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  INTERVAL HISTORY: Since last evaluation, she has suffered a tooth infection followed by an exacerbation of asthma which was treated with antibiotics and prednisone. She also presented in July with abdominal pain which led to a CTAP. This demonstrated a left kidney lesion worrisome for RCC.  SUBJ: Presently at her baseline from a pulmonary/respiratory respective. She continues to have mild exertional dyspnea. Denies CP, fever, purulent sputum, hemoptysis, LE edema and calf tenderness. She was to undergo cervical spine surgery, but this has been postponed because of the events noted above.   OBJ: Vitals:   11/09/16 1012 11/09/16 1015  BP:  140/66  Pulse:  72  SpO2:  94%  Weight: 106 lb (48.1 kg)   Height: 5' 3.5" (1.613 m)    WDWN, NAD HEENT WNL No JVD, LAN. Trach site clean  Minimal scattered bilateral crackles, no wheezes Reg, no M NABS No C/C/E   DATA: CXR 09/12/16: Chronic biapical scarring. No acute findings.  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 Rx refilled. 3) Follow up  in 6 months   Merton Border, MD PCCM service Mobile [phone] Pager [phone] 11/11/2016

## 2016-11-13 ENCOUNTER — Encounter: Payer: Self-pay | Admitting: Family Medicine

## 2016-11-13 ENCOUNTER — Ambulatory Visit (INDEPENDENT_AMBULATORY_CARE_PROVIDER_SITE_OTHER): Payer: Medicare Other | Admitting: Family Medicine

## 2016-11-13 DIAGNOSIS — J988 Other specified respiratory disorders: Secondary | ICD-10-CM | POA: Diagnosis not present

## 2016-11-13 MED ORDER — AMOXICILLIN-POT CLAVULANATE 875-125 MG PO TABS: 1.0000 | ORAL_TABLET | Freq: Two times a day (BID) | ORAL | 0 refills | Status: DC

## 2016-11-13 MED ORDER — PREDNISONE 10 MG PO TABS: ORAL_TABLET | ORAL | 0 refills | Status: DC

## 2016-11-13 NOTE — Patient Instructions (Signed)
Nice to see you. I am sorry you are not feeling well. We'll treat you with Augmentin and prednisone for likely bronchitis and sinusitis with asthma exacerbation. If you do not start to improve over the next 1-2 days we will reevaluate you. If you develop fevers, cough productive of blood, worsening breathing issues, or chest pain please seek medical attention immediately.

## 2016-11-13 NOTE — Assessment & Plan Note (Signed)
Suspect possible sinusitis and bronchitis in addition to asthma exacerbation. She has wheezing on exam. No focal lung findings to indicate a pneumonia. We'll cover with Augmentin for sinusitis and provide some coverage for bronchitis. We will start her on prednisone given her wheezing. She'll continue her albuterol. She'll monitor. Given return precautions. If not improving over the next 2 days she'll follow-up.

## 2016-11-13 NOTE — Progress Notes (Signed)
  Tommi Rumps, MD Phone: [phone]  Anne [name] is a 66 y.o. female who presents today for same-day visit.  Patient notes for the last 5-6 days she's had cough and congestion. Started her sinuses though moved into her chest already. She is blowing discolored mucus out of her nose. She was coughing mucus up though has become dry recently. She notes wheezing and some tightness. No chest pain. Some soreness with coughing. Some shortness of breath or response to her inhalers and nebulizers. She's been tested and Mucinex. Some Tylenol as well. No fevers though has had some chills.  She reports at this point the urologist is monitoring the area on her kidney that was seen previously. She'll continue to follow with them for this.  PMH: nonsmoker.   ROS see history of present illness  Objective  Physical Exam Vitals:   11/13/16 1455  BP: 110/70  Pulse: 64  Temp: 97.8 F (36.6 C)  SpO2: 99%    BP Readings from Last 3 Encounters:  11/13/16 110/70  11/09/16 140/66  10/19/16 115/65   Wt Readings from Last 3 Encounters:  11/13/16 107 lb 6.4 oz (48.7 kg)  11/09/16 106 lb (48.1 kg)  10/19/16 105 lb 8 oz (47.9 kg)    Physical Exam  Constitutional: No distress.  HENT:  Head: Normocephalic and atraumatic.  Mouth/Throat: No oropharyngeal exudate.  Posterior oropharynx with mild erythema, normal TMs  Eyes: Pupils are equal, round, and reactive to light. Conjunctivae are normal.  Neck: Neck supple.  Cardiovascular: Normal rate, regular rhythm and normal heart sounds.   Pulmonary/Chest: Effort normal. No respiratory distress. She has wheezes.  Expiratory wheezes, mildly coarse breath sounds  Lymphadenopathy:    She has no cervical adenopathy.  Neurological: She is alert. Gait normal.  Skin: Skin is warm and dry. She is not diaphoretic.     Assessment/Plan: Please see individual problem list.  Respiratory infection Suspect possible sinusitis and bronchitis in addition to  asthma exacerbation. She has wheezing on exam. No focal lung findings to indicate a pneumonia. We'll cover with Augmentin for sinusitis and provide some coverage for bronchitis. We will start her on prednisone given her wheezing. She'll continue her albuterol. She'll monitor. Given return precautions. If not improving over the next 2 days she'll follow-up.   No orders of the defined types were placed in this encounter.   Meds ordered this encounter  Medications  . predniSONE (DELTASONE) 10 MG tablet    Sig: Take 4 tablets (total 40 mg) by mouth for 2 days; take 3 tablets (total 30 mg) by mouth for 2 days; take 2 tablets (total 20mg ) by mouth for 2 days; then take 1 tablet (total 10mg ) by mouth for 2 days.    Dispense:  20 tablet    Refill:  0  . amoxicillin-clavulanate (AUGMENTIN) 875-125 MG tablet    Sig: Take 1 tablet by mouth 2 (two) times daily.    Dispense:  14 tablet    Refill:  0   Tommi Rumps, MD [place]

## 2016-11-14 ENCOUNTER — Ambulatory Visit: Payer: Medicare Other

## 2016-11-16 ENCOUNTER — Ambulatory Visit (INDEPENDENT_AMBULATORY_CARE_PROVIDER_SITE_OTHER): Payer: Medicare Other | Admitting: *Deleted

## 2016-11-16 DIAGNOSIS — E538 Deficiency of other specified B group vitamins: Secondary | ICD-10-CM

## 2016-11-16 MED ORDER — CYANOCOBALAMIN 1000 MCG/ML IJ SOLN
1000.0000 ug | Freq: Once | INTRAMUSCULAR | Status: AC
  Administered 2016-11-16: 1000 ug via INTRAMUSCULAR

## 2016-11-16 NOTE — Progress Notes (Signed)
Patient presented for B 12 injection to left deltoid, patient voiced no concerns nor showed any signs of distress during injection. 

## 2016-11-16 NOTE — Progress Notes (Signed)
I have reviewed the above note and agree.  Ghalia Reicks, M.D.  

## 2016-11-17 NOTE — Telephone Encounter (Signed)
error 

## 2016-11-21 ENCOUNTER — Other Ambulatory Visit: Payer: Self-pay | Admitting: Family Medicine

## 2016-11-30 ENCOUNTER — Other Ambulatory Visit: Payer: Self-pay

## 2016-11-30 ENCOUNTER — Other Ambulatory Visit: Payer: Self-pay | Admitting: Family Medicine

## 2016-11-30 NOTE — Telephone Encounter (Signed)
Last OV 11/13/2016 Next OV 12/13/2016 Last refill 10/02/2016

## 2016-11-30 NOTE — Telephone Encounter (Signed)
Script faxed.

## 2016-12-13 ENCOUNTER — Encounter: Payer: Self-pay | Admitting: Family Medicine

## 2016-12-13 ENCOUNTER — Ambulatory Visit (INDEPENDENT_AMBULATORY_CARE_PROVIDER_SITE_OTHER): Payer: Medicare Other | Admitting: Family Medicine

## 2016-12-13 VITALS — BP 122/64 | HR 67 | Temp 97.6°F | Wt 107.6 lb

## 2016-12-13 DIAGNOSIS — M47812 Spondylosis without myelopathy or radiculopathy, cervical region: Secondary | ICD-10-CM

## 2016-12-13 DIAGNOSIS — E7800 Pure hypercholesterolemia, unspecified: Secondary | ICD-10-CM

## 2016-12-13 DIAGNOSIS — N289 Disorder of kidney and ureter, unspecified: Secondary | ICD-10-CM

## 2016-12-13 DIAGNOSIS — Z8673 Personal history of transient ischemic attack (TIA), and cerebral infarction without residual deficits: Secondary | ICD-10-CM

## 2016-12-13 DIAGNOSIS — J4521 Mild intermittent asthma with (acute) exacerbation: Secondary | ICD-10-CM | POA: Diagnosis not present

## 2016-12-13 DIAGNOSIS — R7303 Prediabetes: Secondary | ICD-10-CM

## 2016-12-13 LAB — LIPID PANEL
Cholesterol: 215 mg/dL — ABNORMAL HIGH (ref 0–200)
HDL: 122.3 mg/dL (ref >39.00)
LDL Cholesterol: 64 mg/dL (ref 0–99)
NonHDL: 92.2
Total CHOL/HDL Ratio: 2
Triglycerides: 140 mg/dL (ref 0.0–149.0)
VLDL: 28 mg/dL (ref 0.0–40.0)

## 2016-12-13 LAB — COMPREHENSIVE METABOLIC PANEL
ALT: 11 U/L (ref 0–35)
AST: 16 U/L (ref 0–37)
Albumin: 4.3 g/dL (ref 3.5–5.2)
Alkaline Phosphatase: 95 U/L (ref 39–117)
BUN: 12 mg/dL (ref 6–23)
CO2: 30 mEq/L (ref 19–32)
Calcium: 9.7 mg/dL (ref 8.4–10.5)
Chloride: 104 mEq/L (ref 96–112)
Creatinine, Ser: 0.93 mg/dL (ref 0.40–1.20)
GFR: 63.86 mL/min (ref >60.00)
Glucose, Bld: 91 mg/dL (ref 70–99)
Potassium: 3.5 mEq/L (ref 3.5–5.1)
Sodium: 141 mEq/L (ref 135–145)
Total Bilirubin: 0.7 mg/dL (ref 0.2–1.2)
Total Protein: 6.9 g/dL (ref 6.0–8.3)

## 2016-12-13 LAB — HEMOGLOBIN A1C: Hgb A1c MFr Bld: 5.4 % (ref 4.6–6.5)

## 2016-12-13 MED ORDER — PREDNISONE 20 MG PO TABS: 40.0000 mg | ORAL_TABLET | Freq: Every day | ORAL | 0 refills | Status: DC

## 2016-12-13 NOTE — Assessment & Plan Note (Signed)
Patient with current exacerbation. She has used her inhalers with good benefit. Does have some slight wheezing. Oxygen saturation reassuring. She does not appear in any distress. We'll start on a prednisone course. If she develops symptoms worrisome for infection she'll contact us. Discussed that we may or may not be able to provide an antibiotic over the phone.

## 2016-12-13 NOTE — Assessment & Plan Note (Signed)
History of this in the past. No recurrence of symptoms since going on Plavix. We'll check lab work as outlined below. Consider starting on statin.

## 2016-12-13 NOTE — Progress Notes (Signed)
Tommi Rumps, MD Phone: [phone]  Anne [name] is a 66 y.o. female who presents today for follow-up.   Asthma exacerbation: Patient notes over the last day or so she's had slightly increased trouble breathing. Difficulty getting a deep breath in at times. Has had some wheezing and cough. A little bit more congestion. No chest pain. She has used her rescue inhaler and this is beneficial. Notes this typically happens when the weather changes.  History of TIA: Currently on Plavix. Has not had any recurrence in a number of years. The first time she woke up and her face was drawing. The second time she had verbal issues and was hospitalized. Neither time were they able to find a stroke. No recent cholesterol panel.  They're monitoring the lesion on her kidney. She did have some slight discomfort in the area of her kidney sometime ago though this has not recurred.  She is following with neurosurgery for her neck. She does note some decreased foot sensation that has been chronic and unchanged. She does note occasionally her feet will turn purple. This resolves relatively quickly.   PMH: nonsmoker.   ROS see history of present illness  Objective  Physical Exam Vitals:   12/13/16 1017  BP: 122/64  Pulse: 67  Temp: 97.6 F (36.4 C)  SpO2: 98%    BP Readings from Last 3 Encounters:  12/13/16 122/64  11/13/16 110/70  11/09/16 140/66   Wt Readings from Last 3 Encounters:  12/13/16 107 lb 9.6 oz (48.8 kg)  11/13/16 107 lb 6.4 oz (48.7 kg)  11/09/16 106 lb (48.1 kg)    Physical Exam  Constitutional: No distress.  Cardiovascular: Normal rate, regular rhythm and normal heart sounds.   Pulmonary/Chest: Effort normal. No respiratory distress. She has wheezes (faint scattered expiratory wheezes). She has no rales.  Musculoskeletal: She exhibits no edema.  Neurological: She is alert. Gait normal.  Skin: Skin is warm and dry. She is not diaphoretic.  CN 2-12 intact, 5/5  strength in bilateral biceps, triceps, grip, quads, hamstrings, plantar and dorsiflexion, sensation to light touch slightly decreased in bilateral feet though otherwise intact in bilateral UE and LE, normal gait 2+ DP and PT pulses bilaterally  Assessment/Plan: Please see individual problem list.  Asthma Patient with current exacerbation. She has used her inhalers with good benefit. Does have some slight wheezing. Oxygen saturation reassuring. She does not appear in any distress. We'll start on a prednisone course. If she develops symptoms worrisome for infection she'll contact us. Discussed that we may or may not be able to provide an antibiotic over the phone.  History of TIA (transient ischemic attack) History of this in the past. No recurrence of symptoms since going on Plavix. We'll check lab work as outlined below. Consider starting on statin.  Kidney lesion Following with urology. Monitor for any persistent discomfort or hematuria.  Degenerative arthritis of cervical spine She is following with neurosurgery. She states they feel that there is some level of compression that is causing the numbness in her feet. She is planning on having surgery next month. She'll continue to monitor.  Patient does note her feet do occasionally turn purple. She reports no current issues with this. She has intact pulses in her feet appear warm and well perfused. Could be Raynaud's though this occurs at times when it is not cold. Offered evaluation for the blood flow in her legs with ABIs though she deferred this. She opted to monitor.  Orders Placed This Encounter  Procedures  . Comp Met (CMET)  . HgB A1c  . Lipid Profile    Meds ordered this encounter  Medications  . predniSONE (DELTASONE) 20 MG tablet    Sig: Take 2 tablets (40 mg total) by mouth daily with breakfast.    Dispense:  10 tablet    Refill:  0    Tommi Rumps, MD [place]

## 2016-12-13 NOTE — Assessment & Plan Note (Signed)
She is following with neurosurgery. She states they feel that there is some level of compression that is causing the numbness in her feet. She is planning on having surgery next month. She'll continue to monitor.

## 2016-12-13 NOTE — Patient Instructions (Signed)
Nice to see you. We'll treat you with prednisone for your asthma flare. We will check lab work today and contact you with the results. Please follow up with your surgeon. If you develop cough productive of blood, fevers, trouble breathing, or any new or changing symptoms please seek medical attention.

## 2016-12-13 NOTE — Assessment & Plan Note (Signed)
Following with urology. Monitor for any persistent discomfort or hematuria.

## 2016-12-14 ENCOUNTER — Telehealth: Payer: Self-pay | Admitting: Family Medicine

## 2016-12-14 ENCOUNTER — Other Ambulatory Visit: Payer: Self-pay | Admitting: Family Medicine

## 2016-12-14 DIAGNOSIS — E785 Hyperlipidemia, unspecified: Secondary | ICD-10-CM

## 2016-12-14 MED ORDER — ROSUVASTATIN CALCIUM 5 MG PO TABS: 5.0000 mg | ORAL_TABLET | Freq: Every day | ORAL | 1 refills | Status: DC

## 2016-12-14 NOTE — Telephone Encounter (Signed)
See result note.  

## 2016-12-14 NOTE — Telephone Encounter (Signed)
Pt called back returning your call. Please advise, thank you!  Call pt @ [phone]

## 2016-12-20 ENCOUNTER — Encounter: Payer: Self-pay | Admitting: Family Medicine

## 2016-12-21 ENCOUNTER — Encounter: Payer: Self-pay | Admitting: Family Medicine

## 2017-01-01 ENCOUNTER — Encounter: Payer: Self-pay | Admitting: Family Medicine

## 2017-01-02 ENCOUNTER — Encounter: Payer: Self-pay | Admitting: Family Medicine

## 2017-01-02 ENCOUNTER — Ambulatory Visit (INDEPENDENT_AMBULATORY_CARE_PROVIDER_SITE_OTHER): Payer: Medicare Other | Admitting: Family Medicine

## 2017-01-02 DIAGNOSIS — B029 Zoster without complications: Secondary | ICD-10-CM | POA: Diagnosis not present

## 2017-01-02 DIAGNOSIS — E538 Deficiency of other specified B group vitamins: Secondary | ICD-10-CM | POA: Diagnosis not present

## 2017-01-02 MED ORDER — VALACYCLOVIR HCL 1 G PO TABS: 1000.0000 mg | ORAL_TABLET | Freq: Two times a day (BID) | ORAL | 0 refills | Status: DC

## 2017-01-02 MED ORDER — CYANOCOBALAMIN 1000 MCG/ML IJ SOLN
1000.0000 ug | Freq: Once | INTRAMUSCULAR | Status: AC
  Administered 2017-01-02: 1000 ug via INTRAMUSCULAR

## 2017-01-02 NOTE — Assessment & Plan Note (Signed)
Rash concerning for shingles. We'll treat with Valtrex that is renally dosed. Discussed avoidance of pregnant women, immunocompromised patients, and those that have not had chickenpox previously. She'll keep the area covered. If not improving she will let us know.

## 2017-01-02 NOTE — Patient Instructions (Signed)
Nice to see you. I suspect you have Shingles. We will treat with Valtrex. You need to avoid people who have not had chickenpox, pregnant women, and immunocompromised people. If this does not start to improve please let us know.

## 2017-01-02 NOTE — Progress Notes (Signed)
  Tommi Rumps, MD Phone: [phone]  Anne [name] is a 66 y.o. female who presents today for follow-up.  Patient notes onset of rash on her right trapezius yesterday. She initially felt like she had a bump and had some pain in this area and then several other bumps popped up. They have broken open at this point. She did use a topical patch on her bilateral trapezius muscles several days prior though had no reaction on the left side. She notes there is discomfort of achiness and soreness in the area of the rash. She has had no fevers.  PMH: Has had shingles 2 times previously.   ROS see history of present illness  Objective  Physical Exam Vitals:   01/02/17 0854  BP: 128/64  Pulse: 66  Temp: 97.6 F (36.4 C)  SpO2: 97%    BP Readings from Last 3 Encounters:  01/02/17 128/64  12/13/16 122/64  11/13/16 110/70   Wt Readings from Last 3 Encounters:  01/02/17 107 lb 4 oz (48.6 kg)  12/13/16 107 lb 9.6 oz (48.8 kg)  11/13/16 107 lb 6.4 oz (48.7 kg)    Physical Exam  Constitutional: No distress.  Cardiovascular: Normal rate, regular rhythm and normal heart sounds.   Pulmonary/Chest: Effort normal and breath sounds normal.  Skin: She is not diaphoretic.        Assessment/Plan: Please see individual problem list.  Shingles Rash concerning for shingles. We'll treat with Valtrex that is renally dosed. Discussed avoidance of pregnant women, immunocompromised patients, and those that have not had chickenpox previously. She'll keep the area covered. If not improving she will let us know.   No orders of the defined types were placed in this encounter.   Meds ordered this encounter  Medications  . cyanocobalamin ((VITAMIN B-12)) injection 1,000 mcg  . valACYclovir (VALTREX) 1000 MG tablet    Sig: Take 1 tablet (1,000 mg total) by mouth 2 (two) times daily.    Dispense:  14 tablet    Refill:  0   Tommi Rumps, MD [place]

## 2017-01-03 NOTE — Progress Notes (Signed)
PCP: Notified Vanessa @ Dr. Ronnald Ramp' office of pt. may have shingles per note from Dr. Bennetta Laos, pt's PCP. Stated she would inform him.

## 2017-01-04 ENCOUNTER — Encounter (HOSPITAL_COMMUNITY)
Admission: RE | Admit: 2017-01-04 | Discharge: 2017-01-04 | Disposition: A | Discharge status: Home / Self Care | Payer: Medicare Other | Source: Ambulatory Visit | Attending: Neurological Surgery | Admitting: Neurological Surgery

## 2017-01-04 ENCOUNTER — Encounter (HOSPITAL_COMMUNITY): Payer: Self-pay

## 2017-01-04 DIAGNOSIS — J449 Chronic obstructive pulmonary disease, unspecified: Secondary | ICD-10-CM | POA: Diagnosis not present

## 2017-01-04 DIAGNOSIS — J4530 Mild persistent asthma, uncomplicated: Secondary | ICD-10-CM | POA: Diagnosis not present

## 2017-01-04 DIAGNOSIS — Z930 Tracheostomy status: Secondary | ICD-10-CM | POA: Diagnosis not present

## 2017-01-04 DIAGNOSIS — Z79899 Other long term (current) drug therapy: Secondary | ICD-10-CM | POA: Diagnosis not present

## 2017-01-04 DIAGNOSIS — Z01812 Encounter for preprocedural laboratory examination: Secondary | ICD-10-CM | POA: Insufficient documentation

## 2017-01-04 LAB — CBC WITH DIFFERENTIAL/PLATELET
Basophils Absolute: 0 10*3/uL (ref 0.0–0.1)
Basophils Relative: 0 %
Eosinophils Absolute: 0.2 10*3/uL (ref 0.0–0.7)
Eosinophils Relative: 3 %
HCT: 39.4 % (ref 36.0–46.0)
Hemoglobin: 13 g/dL (ref 12.0–15.0)
Lymphocytes Relative: 21 %
Lymphs Abs: 1.2 10*3/uL (ref 0.7–4.0)
MCH: 31 pg (ref 26.0–34.0)
MCHC: 33 g/dL (ref 30.0–36.0)
MCV: 93.8 fL (ref 78.0–100.0)
Monocytes Absolute: 0.5 10*3/uL (ref 0.1–1.0)
Monocytes Relative: 9 %
Neutro Abs: 3.8 10*3/uL (ref 1.7–7.7)
Neutrophils Relative %: 67 %
Platelets: 221 10*3/uL (ref 150–400)
RBC: 4.2 MIL/uL (ref 3.87–5.11)
RDW: 13.1 % (ref 11.5–15.5)
WBC: 5.7 10*3/uL (ref 4.0–10.5)

## 2017-01-04 LAB — SURGICAL PCR SCREEN
MRSA, PCR: NEGATIVE
Staphylococcus aureus: NEGATIVE

## 2017-01-04 LAB — BASIC METABOLIC PANEL
Anion gap: 8 (ref 5–15)
BUN: 9 mg/dL (ref 6–20)
CO2: 27 mmol/L (ref 22–32)
Calcium: 9.5 mg/dL (ref 8.9–10.3)
Chloride: 103 mmol/L (ref 101–111)
Creatinine, Ser: 1.03 mg/dL — ABNORMAL HIGH (ref 0.44–1.00)
GFR calc Af Amer: >60 mL/min (ref >60)
GFR calc non Af Amer: 55 mL/min — ABNORMAL LOW (ref >60)
Glucose, Bld: 89 mg/dL (ref 65–99)
Potassium: 3.9 mmol/L (ref 3.5–5.1)
Sodium: 138 mmol/L (ref 135–145)

## 2017-01-04 LAB — TYPE AND SCREEN
ABO/RH(D): O NEG
Antibody Screen: NEGATIVE

## 2017-01-04 LAB — PROTIME-INR
INR: 0.97
Prothrombin Time: 12.8 seconds (ref 11.4–15.2)

## 2017-01-04 NOTE — Pre-Procedure Instructions (Addendum)
Anne [name]  01/04/2017      [facility] - [place], [place] - [address] [place] [address] Phone: [phone] Fax: [phone]    Your procedure is scheduled on Wed. Oct. 17  Report to [facility] Admitting at 6:30 A.M.  Call this number if you have problems the morning of surgery:  [phone]   Remember:  Do not eat food or drink liquids after midnight on Tues. Oct. 16   Take these medicines the morning of surgery with A SIP OF WATER : tylenol if needed, albuterol inhaler-bring to hospital, alprazolam (xanax), zyrtec, estradiol (estrace), flonase nasals pray if needed, eye drops, nebulizer if needed, linzess, protonix,, valtrex               STOP PLAVIX PER DR. Ronnald Ramp              7 days prior to surgery STOP taking any Aspirin (unless otherwise instructed by your surgeon), Aleve, Naproxen, Ibuprofen, Motrin, Advil, Goody's, BC's, all herbal medications, fish oil, and all vitamins    Do not wear jewelry, make-up or nail polish.  Do not wear lotions, powders, or perfumes, or deoderant.  Do not shave 48 hours prior to surgery.  Men may shave face and neck.  Do not bring valuables to the hospital.  [facility] is not responsible for any belongings or valuables.  Contacts, dentures or bridgework may not be worn into surgery.  Leave your suitcase in the car.  After surgery it may be brought to your room.  For patients admitted to the hospital, discharge time will be determined by your treatment team.  Patients discharged the day of surgery will not be allowed to drive home.    Special instructions:  [facility]- Preparing For Surgery  Before surgery, you can play an important role. Because skin is not sterile, your skin needs to be as free of germs as possible. You can reduce the number of germs on your skin by washing with CHG (chlorahexidine gluconate) Soap before surgery.  CHG is an antiseptic cleaner which kills germs and  bonds with the skin to continue killing germs even after washing.  Please do not use if you have an allergy to CHG or antibacterial soaps. If your skin becomes reddened/irritated stop using the CHG.  Do not shave (including legs and underarms) for at least 48 hours prior to first CHG shower. It is OK to shave your face.  Please follow these instructions carefully.   1. Shower the NIGHT BEFORE SURGERY and the MORNING OF SURGERY with CHG.   2. If you chose to wash your hair, wash your hair first as usual with your normal shampoo.  3. After you shampoo, rinse your hair and body thoroughly to remove the shampoo.  4. Use CHG as you would any other liquid soap. You can apply CHG directly to the skin and wash gently with a scrungie or a clean washcloth.   5. Apply the CHG Soap to your body ONLY FROM THE NECK DOWN.  Do not use on open wounds or open sores. Avoid contact with your eyes, ears, mouth and genitals (private parts). Wash Face and genitals (private parts)  with your normal soap.  6. Wash thoroughly, paying special attention to the area where your surgery will be performed.  7. Thoroughly rinse your body with warm water from the neck down.  8. DO NOT shower/wash with your normal soap after  using and rinsing off the CHG Soap.  9. Pat yourself dry with a CLEAN TOWEL.  10. Wear CLEAN PAJAMAS to bed the night before surgery, wear comfortable clothes the morning of surgery  11. Place CLEAN SHEETS on your bed the night of your first shower and DO NOT SLEEP WITH PETS.    Day of Surgery: Do not apply any deodorants/lotions. Please wear clean clothes to the hospital/surgery center.      Please read over the following fact sheets that you were given. Coughing and Deep Breathing, MRSA Information and Surgical Site Infection Prevention

## 2017-01-04 NOTE — Progress Notes (Signed)
Anesthesia PAT Evaluation: Patient is a 66 year old female seen in consultation by myself and anesthesiologist Dr. Conrad [place] on 08/08/16 due to chronic tracheostomy, pulmonary fibrosis. She was scheduled for laminectomy and foraminotomy C4-5, C5-6, posterior cervical instrumented fusion C3-7 on 08/16/16 by Dr. Sherley Bounds, but case was ultimately postponed due to asthma exacerbation requiring prednisone taper. Surgery was rescheduled for 10/19/16, but again postponed due to need for urology evaluation for incidental finding of left renal nodule on 10/12/16 abdominal CT for evaluation of possible colitis. Urology is going to re-evaluate in six months. Now rescheduled for 01/10/17. Patient currently doing well from a respiratory standpoint, but developed localized papular rash along her right trapezius region over the weekend concerning for recurrent shingles and was started on Valtrex on 01/02/17. She had malaise and tingling in this area starting 12/30/16 and developed rash by 01/01/17. On exam 01/04/17, she had a few healing papular lesions along the right trapezium region, but erythema had resolved and lesions with central scab without drainage. There were no vesicular or open lesions. Based on findings at PAT, I do not think she is currently contagious. (I had also discussed criteria with Stephania Fragmin, RN with Infection Prevention, [phone].) Patient will continue the prescribed seven day course of Valtrex. Lorriane Shire at Dr. Ronnald Ramp' office has been notified that patient recently started on Valtrex and of 01/04/17 exam findings. Discussed with patient that area will have to be re-examined on the day of surgery and sooner if requested by Dr. Ronnald Ramp. Also discussed it is possible that Dr. Ronnald Ramp could still want to postpone surgery due to the close proximity of her surgical site--even without active lesions. I asked Lorriane Shire to follow-up with patient if Dr. Ronnald Ramp has any additional concerns.   As per my previous  note: Surgeon and patient have requested an ANESTHESIOLOGIST consult (re: tracheostomy '96, pulmonary fibrosis, COPD, asthma, vocal cord dysfunction and airway/positioning recommendations for surgery in prone position).   History includes non-smoker, asthma, COPD, pulmonary fibrosis, left vocal cord "mechanical" dysfunction, tracheostomy '96 (#4 Shilley; placed for recurrent upper airway obstruction related to asthma and left vocal cord "mechanical" dysfunction), childhood murmur, anemia, migraines, suspected TIA (~ '98 left facial droop and ~ '03 dysarthria; reported MRI negative), cervical degenerative disc disease, IBS, hysterectomy, jejunostomy feeding tube (doesn't have any longer), dysphagia (eats small amount, little to no meat), achalasia (type III or spastic), video bronchoscopy (negative fungal cultures)11/20/12, left renal nodule 09/2016.  - PCP is Dr. Tommi Rumps. Last visit 01/02/17 for possible Herpes Zoster rash on right trapezius. She was started on Valtrex.  - Pulmonologist is Dr. Merton Border with [facility] (previously Dr. Simonne Maffucci). He last saw her on 11/09/16 with six month follow-up recommended. 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 She tried off of Singulair and feels that her asthma control is better while on that medication. 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 The presence of the tracheostomy tube will create some challenges perioperatively. It will have to be changed out to a cuffed tube for ventilatory support intraoperatively. It might create some  problems with patient positioning as she notes that she will have to be in a prone  position for the surgery. On the other hand, it will provide reliable airway support and access postoperatively and will potentiallyreduce the risk of any perioperative airway complications. Overall, I judge her risk of perioperative pulmonary complications to be only mildly increased compared to a population with no underlying lung disease. There are no interventions to be undertaken at this time that might further mitigate that risk  PLAN: 1) Continue current medical regimen and trach tube care including montelukast and PRN albuterol 2) Proceed with surgery as planned. Postoperatively, she should receive scheduled nebulized bronchodilators for the first 24-48 hours. Pulmonary/critical care medicine consultation postoperatively should be considered 3) Follow up in 3-69months   - GI is Dr. Zenovia Jarred. - GU is Dr. Baruch Gouty. - ENT is Dr. Ermalinda Barrios. - She was evaluated by cardiologist Dr. Kathlyn [place] in the fall of 2015 for atypical chest pain. She had a stress (followed by echo to confirm EF) with results below.  Meds include albuterol, Xanax, Zyrtec, Plavix (last dose 01/02/17), estradiol, Flonase, Lasix, Duoneb, Linzess, Salonpas Pain Relief Patch, Singulair, Protonix, KCl, Crestor, Zomig-ZMT, Valtrex 1000 mg BID (#14 pills; started 01/02/17).   EKG 08/08/16: NSR, septal infarct (age undetermined). She has had septal infarct cited on EKGs dating back to at least 02/26/12.   Nuclear stress test 02/03/14 [facility]): Summary: 1. Pharmacological myocardial perfusion study with no significant ischemia. 2. No significant wall motion abnormality noted. 3. The LV global function was abnormal. 4. EF 37%. 5. There are EKG changes concerning for ischemia at peak stress, 1 mm depressions in V4-6. 6. There is GI uptake artifact noted on this study. 7. Overall, this is a Low risk scan. 8. Consider an echocardiogram to  confirm depressed EF.  Echo 02/16/14: Study Conclusions - Left ventricle: The cavity size was normal. Systolic function was normal. The estimated ejection fraction was in the range of 60% to 65%. Wall motion was normal; there were no regional wall motion abnormalities. Left ventricular diastolic function parameters were normal. - Aortic valve: There was mild regurgitation. - Mitral valve: There was mild regurgitation. - Right ventricle: Systolic function was normal. - Pulmonary arteries: Systolic pressure was within the normal range. Impressions: - Otherwise a normal study.  Carotid U/S 07/05/04: IMPRESSION:  1)There is noted very slight calcific plaque formation on the RIGHT.  2)No hemodynamically significant stenosis is seen on either side.  3)Antegrade flow is noted in both vertebrals.   CXR 09/12/16: IMPRESSION: 1. Stable chronic change with scarring particular in the upper lobes and apices. 2. No definite active process.  CT Chest High Resolution 03/01/16: IMPRESSION: 1. Extensive patchy subpleural reticulation, traction bronchiectasis, volume loss, distortion and pleural thickening relatively symmetrically and almost exclusively involving the upper lobes with associated superior hilar retraction. No frank honeycombing. Findings have progressed since 2014. 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 2. Mild patchy tree-in-bud opacities in the basilar lower lobes, similar to but less prominent than the 08/08/2012 chest CT, favoring a mild infectious or inflammatory bronchiolitis with the differential including mild aspiration.  Abdominal MRI 10/16/16: IMPRESSION: 12 mm lesion in the upper pole the left kidney somewhat ill-defined and has apparent enhancement after IV contrast administration. Lesion is suspicious for neoplasm. No Evidence for lymphadenopathy in the abdomen.  Preoperative labs noted. Cr 1.03. CBC WNL. T&S  done.  Dr. Conrad [place]as[place] previously discussed with patient to expect that her #4 Ralene Ok would be temporarily exchanged with a larger ETT (at least 6.0) during surgery and secured  for prone positioning. Post-operatively she would ultimately get a #4 Shilley replaced but due to larger ETT size, she could have a temporary leak and may require a cuffed trach and/or Cortisone injection. Dr. Alva Garnet recommendations also include scheduled nebulized bronchodilators for the first 24-48 hours and consideration of PCCM consult post-operatively.   George Hugh [facility] Short Stay Center/Anesthesiology Phone [phone] 01/05/2017 10:32 AM

## 2017-01-04 NOTE — Telephone Encounter (Signed)
Went to call patient to make aware of flu shot here in the office (egg free) and she is concerned with her shingles diagnosis and if she should have the shot or not, she also stated that the medication that you prescribed is not working.  Please advise, ?

## 2017-01-04 NOTE — Progress Notes (Signed)
PCP: Dr. Tommi Rumps Pulm: Dr. Shanon Brow simonds Cardiologist: Dr. Fletcher Anon

## 2017-01-09 NOTE — Anesthesia Preprocedure Evaluation (Signed)
Anesthesia Evaluation  Patient identified by MRN, date of birth, ID band Patient awake    Reviewed: Allergy & Precautions, H&P , Patient's Chart, lab work & pertinent test results, reviewed documented beta blocker date and time   Airway Mallampati: II  TM Distance: >3 FB Neck ROM: full    Dental no notable dental hx.    Pulmonary    breath sounds clear to auscultation       Cardiovascular  Rhythm:regular Rate:Normal     Neuro/Psych  Neuromuscular disease CVA    GI/Hepatic   Endo/Other    Renal/GU      Musculoskeletal   Abdominal   Peds  Hematology   Anesthesia Other Findings   Reproductive/Obstetrics                             Anesthesia Physical Anesthesia Plan  ASA: II  Anesthesia Plan: General   Post-op Pain Management:    Induction: Intravenous and Inhalational  PONV Risk Score and Plan: 2 and Ondansetron, Dexamethasone and Treatment may vary due to age or medical condition  Airway Management Planned: Tracheostomy  Additional Equipment:   Intra-op Plan:   Post-operative Plan: Possible Post-op intubation/ventilation  Informed Consent: I have reviewed the patients History and Physical, chart, labs and discussed the procedure including the risks, benefits and alternatives for the proposed anesthesia with the patient or authorized representative who has indicated his/her understanding and acceptance.   Dental Advisory Given  Plan Discussed with: CRNA and Surgeon  Anesthesia Plan Comments: (  )        Anesthesia Quick Evaluation

## 2017-01-10 ENCOUNTER — Inpatient Hospital Stay (HOSPITAL_COMMUNITY): Payer: Medicare Other | Admitting: Vascular Surgery

## 2017-01-10 ENCOUNTER — Encounter (HOSPITAL_COMMUNITY): Payer: Self-pay

## 2017-01-10 ENCOUNTER — Inpatient Hospital Stay (HOSPITAL_COMMUNITY)
Admission: RE | Disposition: A | Discharge status: Home Health | Payer: Self-pay | Source: Home / Self Care | Attending: Neurological Surgery

## 2017-01-10 ENCOUNTER — Inpatient Hospital Stay (HOSPITAL_COMMUNITY): Payer: Medicare Other

## 2017-01-10 ENCOUNTER — Inpatient Hospital Stay (HOSPITAL_COMMUNITY)
Admission: RE | Admit: 2017-01-10 | Discharge: 2017-01-13 | DRG: 472 | Disposition: A | Discharge status: Home Health | Payer: Medicare Other | Attending: Neurosurgery | Admitting: Neurosurgery

## 2017-01-10 ENCOUNTER — Inpatient Hospital Stay (HOSPITAL_COMMUNITY): Payer: Medicare Other | Admitting: Emergency Medicine

## 2017-01-10 DIAGNOSIS — Z8673 Personal history of transient ischemic attack (TIA), and cerebral infarction without residual deficits: Secondary | ICD-10-CM

## 2017-01-10 DIAGNOSIS — Z91013 Allergy to seafood: Secondary | ICD-10-CM | POA: Diagnosis not present

## 2017-01-10 DIAGNOSIS — Z419 Encounter for procedure for purposes other than remedying health state, unspecified: Secondary | ICD-10-CM

## 2017-01-10 DIAGNOSIS — Z91010 Allergy to peanuts: Secondary | ICD-10-CM

## 2017-01-10 DIAGNOSIS — J449 Chronic obstructive pulmonary disease, unspecified: Secondary | ICD-10-CM | POA: Diagnosis present

## 2017-01-10 DIAGNOSIS — M4712 Other spondylosis with myelopathy, cervical region: Secondary | ICD-10-CM | POA: Diagnosis not present

## 2017-01-10 DIAGNOSIS — Z7902 Long term (current) use of antithrombotics/antiplatelets: Secondary | ICD-10-CM | POA: Diagnosis not present

## 2017-01-10 DIAGNOSIS — Z79899 Other long term (current) drug therapy: Secondary | ICD-10-CM

## 2017-01-10 DIAGNOSIS — Z882 Allergy status to sulfonamides status: Secondary | ICD-10-CM

## 2017-01-10 DIAGNOSIS — Z888 Allergy status to other drugs, medicaments and biological substances status: Secondary | ICD-10-CM | POA: Diagnosis not present

## 2017-01-10 DIAGNOSIS — Z981 Arthrodesis status: Secondary | ICD-10-CM

## 2017-01-10 DIAGNOSIS — K589 Irritable bowel syndrome without diarrhea: Secondary | ICD-10-CM | POA: Diagnosis present

## 2017-01-10 DIAGNOSIS — Z881 Allergy status to other antibiotic agents status: Secondary | ICD-10-CM | POA: Diagnosis not present

## 2017-01-10 DIAGNOSIS — Z930 Tracheostomy status: Secondary | ICD-10-CM | POA: Diagnosis not present

## 2017-01-10 DIAGNOSIS — Z887 Allergy status to serum and vaccine status: Secondary | ICD-10-CM | POA: Diagnosis not present

## 2017-01-10 DIAGNOSIS — J309 Allergic rhinitis, unspecified: Secondary | ICD-10-CM | POA: Diagnosis present

## 2017-01-10 DIAGNOSIS — J3800 Paralysis of vocal cords and larynx, unspecified: Secondary | ICD-10-CM | POA: Diagnosis not present

## 2017-01-10 DIAGNOSIS — Z886 Allergy status to analgesic agent status: Secondary | ICD-10-CM

## 2017-01-10 DIAGNOSIS — J8410 Pulmonary fibrosis, unspecified: Secondary | ICD-10-CM | POA: Diagnosis present

## 2017-01-10 DIAGNOSIS — G43909 Migraine, unspecified, not intractable, without status migrainosus: Secondary | ICD-10-CM | POA: Diagnosis present

## 2017-01-10 DIAGNOSIS — M4802 Spinal stenosis, cervical region: Secondary | ICD-10-CM | POA: Diagnosis not present

## 2017-01-10 DIAGNOSIS — R531 Weakness: Secondary | ICD-10-CM | POA: Diagnosis not present

## 2017-01-10 HISTORY — PX: POSTERIOR CERVICAL FUSION/FORAMINOTOMY: SHX5038

## 2017-01-10 HISTORY — DX: Arthrodesis status: Z98.1

## 2017-01-10 LAB — GLUCOSE, CAPILLARY: Glucose-Capillary: 153 mg/dL — ABNORMAL HIGH (ref 65–99)

## 2017-01-10 SURGERY — POSTERIOR CERVICAL FUSION/FORAMINOTOMY LEVEL 4
Anesthesia: General | Site: Spine Cervical

## 2017-01-10 MED ORDER — FENTANYL CITRATE (PF) 100 MCG/2ML IJ SOLN
INTRAMUSCULAR | Status: DC | PRN
Start: 2017-01-10 — End: 2017-01-10
  Administered 2017-01-10 (×2): 50 ug via INTRAVENOUS
  Administered 2017-01-10: 150 ug via INTRAVENOUS

## 2017-01-10 MED ORDER — ONDANSETRON HCL 4 MG/2ML IJ SOLN
INTRAMUSCULAR | Status: AC
  Filled 2017-01-10: qty 2

## 2017-01-10 MED ORDER — ACETAMINOPHEN 650 MG RE SUPP: 650.0000 mg | RECTAL | Status: DC | PRN

## 2017-01-10 MED ORDER — SODIUM CHLORIDE 0.9 % IV SOLN: 250.0000 mL | INTRAVENOUS | Status: DC

## 2017-01-10 MED ORDER — SCOPOLAMINE 1 MG/3DAYS TD PT72
MEDICATED_PATCH | TRANSDERMAL | Status: DC | PRN
  Administered 2017-01-10: 1.5 mg via TRANSDERMAL

## 2017-01-10 MED ORDER — FENTANYL CITRATE (PF) 100 MCG/2ML IJ SOLN
INTRAMUSCULAR | Status: AC
  Administered 2017-01-10: 50 ug via INTRAVENOUS
  Filled 2017-01-10: qty 2

## 2017-01-10 MED ORDER — CHLORHEXIDINE GLUCONATE CLOTH 2 % EX PADS: 6.0000 | MEDICATED_PAD | Freq: Once | CUTANEOUS | Status: DC

## 2017-01-10 MED ORDER — DEXAMETHASONE SODIUM PHOSPHATE 10 MG/ML IJ SOLN
10.0000 mg | Freq: Once | INTRAMUSCULAR | Status: AC
  Administered 2017-01-10: 10 mg via INTRAVENOUS

## 2017-01-10 MED ORDER — ROCURONIUM BROMIDE 100 MG/10ML IV SOLN
INTRAVENOUS | Status: DC | PRN
  Administered 2017-01-10: 40 mg via INTRAVENOUS

## 2017-01-10 MED ORDER — SENNA 8.6 MG PO TABS
1.0000 | ORAL_TABLET | Freq: Two times a day (BID) | ORAL | Status: DC
  Administered 2017-01-10 – 2017-01-13 (×6): 8.6 mg via ORAL
  Filled 2017-01-10 (×6): qty 1

## 2017-01-10 MED ORDER — THROMBIN 20000 UNITS EX KIT
PACK | CUTANEOUS | Status: AC
  Filled 2017-01-10: qty 1

## 2017-01-10 MED ORDER — MENTHOL 3 MG MT LOZG: 1.0000 | LOZENGE | OROMUCOSAL | Status: DC | PRN

## 2017-01-10 MED ORDER — SODIUM CHLORIDE 0.9 % IR SOLN
Status: DC | PRN
  Administered 2017-01-10: 500 mL

## 2017-01-10 MED ORDER — BUPIVACAINE HCL (PF) 0.25 % IJ SOLN
INTRAMUSCULAR | Status: DC | PRN
  Administered 2017-01-10: 8 mL

## 2017-01-10 MED ORDER — SCOPOLAMINE 1 MG/3DAYS TD PT72
MEDICATED_PATCH | TRANSDERMAL | Status: AC
  Filled 2017-01-10: qty 1

## 2017-01-10 MED ORDER — PHENOL 1.4 % MT LIQD: 1.0000 | OROMUCOSAL | Status: DC | PRN

## 2017-01-10 MED ORDER — FENTANYL CITRATE (PF) 250 MCG/5ML IJ SOLN
INTRAMUSCULAR | Status: AC
  Filled 2017-01-10: qty 5

## 2017-01-10 MED ORDER — PROPOFOL 10 MG/ML IV BOLUS
INTRAVENOUS | Status: AC
  Filled 2017-01-10: qty 20

## 2017-01-10 MED ORDER — DEXAMETHASONE SODIUM PHOSPHATE 10 MG/ML IJ SOLN
INTRAMUSCULAR | Status: AC
  Filled 2017-01-10: qty 1

## 2017-01-10 MED ORDER — CEFAZOLIN SODIUM-DEXTROSE 2-4 GM/100ML-% IV SOLN
2.0000 g | INTRAVENOUS | Status: AC
  Administered 2017-01-10: 2 g via INTRAVENOUS
  Filled 2017-01-10: qty 100

## 2017-01-10 MED ORDER — VANCOMYCIN HCL 1000 MG IV SOLR
INTRAVENOUS | Status: AC
  Filled 2017-01-10: qty 1000

## 2017-01-10 MED ORDER — MONTELUKAST SODIUM 10 MG PO TABS
10.0000 mg | ORAL_TABLET | Freq: Every day | ORAL | Status: DC
  Administered 2017-01-10 – 2017-01-12 (×3): 10 mg via ORAL
  Filled 2017-01-10 (×3): qty 1

## 2017-01-10 MED ORDER — METHOCARBAMOL 1000 MG/10ML IJ SOLN: 500.0000 mg | Freq: Four times a day (QID) | INTRAMUSCULAR | Status: DC | PRN

## 2017-01-10 MED ORDER — VALACYCLOVIR HCL 500 MG PO TABS
1000.0000 mg | ORAL_TABLET | Freq: Two times a day (BID) | ORAL | Status: DC
  Filled 2017-01-10: qty 2

## 2017-01-10 MED ORDER — PROMETHAZINE HCL 25 MG/ML IJ SOLN
6.2500 mg | Freq: Four times a day (QID) | INTRAMUSCULAR | Status: DC | PRN
  Administered 2017-01-10: 6.25 mg via INTRAVENOUS

## 2017-01-10 MED ORDER — SUGAMMADEX SODIUM 200 MG/2ML IV SOLN
INTRAVENOUS | Status: AC
  Filled 2017-01-10: qty 2

## 2017-01-10 MED ORDER — BACITRACIN ZINC 500 UNIT/GM EX OINT
TOPICAL_OINTMENT | CUTANEOUS | Status: AC
  Filled 2017-01-10: qty 28.35

## 2017-01-10 MED ORDER — LIDOCAINE 2% (20 MG/ML) 5 ML SYRINGE
INTRAMUSCULAR | Status: AC
  Filled 2017-01-10: qty 10

## 2017-01-10 MED ORDER — METHOCARBAMOL 500 MG PO TABS
500.0000 mg | ORAL_TABLET | Freq: Four times a day (QID) | ORAL | Status: DC | PRN
  Administered 2017-01-11 – 2017-01-13 (×6): 500 mg via ORAL
  Filled 2017-01-10 (×6): qty 1

## 2017-01-10 MED ORDER — OXYCODONE HCL 5 MG PO TABS
5.0000 mg | ORAL_TABLET | ORAL | Status: DC | PRN
  Administered 2017-01-10 – 2017-01-13 (×10): 5 mg via ORAL
  Filled 2017-01-10 (×11): qty 1

## 2017-01-10 MED ORDER — ACETAMINOPHEN 10 MG/ML IV SOLN
INTRAVENOUS | Status: DC | PRN
  Administered 2017-01-10: 1000 mg via INTRAVENOUS

## 2017-01-10 MED ORDER — SODIUM CHLORIDE 0.9% FLUSH
3.0000 mL | Freq: Two times a day (BID) | INTRAVENOUS | Status: DC
  Administered 2017-01-12: 3 mL via INTRAVENOUS

## 2017-01-10 MED ORDER — THROMBIN 5000 UNITS EX SOLR
OROMUCOSAL | Status: DC | PRN
  Administered 2017-01-10: 5 mL via TOPICAL

## 2017-01-10 MED ORDER — ONDANSETRON HCL 4 MG/2ML IJ SOLN: 4.0000 mg | Freq: Four times a day (QID) | INTRAMUSCULAR | Status: DC | PRN

## 2017-01-10 MED ORDER — ONDANSETRON HCL 4 MG/2ML IJ SOLN
INTRAMUSCULAR | Status: DC | PRN
  Administered 2017-01-10 (×2): 4 mg via INTRAVENOUS

## 2017-01-10 MED ORDER — MIDAZOLAM HCL 5 MG/5ML IJ SOLN
INTRAMUSCULAR | Status: DC | PRN
  Administered 2017-01-10 (×2): 1 mg via INTRAVENOUS

## 2017-01-10 MED ORDER — CEFAZOLIN SODIUM-DEXTROSE 2-4 GM/100ML-% IV SOLN
2.0000 g | Freq: Three times a day (TID) | INTRAVENOUS | Status: AC
  Administered 2017-01-10 – 2017-01-11 (×2): 2 g via INTRAVENOUS
  Filled 2017-01-10 (×2): qty 100

## 2017-01-10 MED ORDER — ROCURONIUM BROMIDE 50 MG/5ML IV SOLN
INTRAVENOUS | Status: AC
  Filled 2017-01-10: qty 1

## 2017-01-10 MED ORDER — PHENYLEPHRINE 40 MCG/ML (10ML) SYRINGE FOR IV PUSH (FOR BLOOD PRESSURE SUPPORT)
PREFILLED_SYRINGE | INTRAVENOUS | Status: AC
  Filled 2017-01-10: qty 10

## 2017-01-10 MED ORDER — PROMETHAZINE HCL 25 MG/ML IJ SOLN
INTRAMUSCULAR | Status: AC
  Administered 2017-01-10: 6.25 mg via INTRAVENOUS
  Filled 2017-01-10: qty 1

## 2017-01-10 MED ORDER — LIDOCAINE HCL (CARDIAC) 20 MG/ML IV SOLN
INTRAVENOUS | Status: DC | PRN
  Administered 2017-01-10: 50 mg via INTRAVENOUS

## 2017-01-10 MED ORDER — SUGAMMADEX SODIUM 200 MG/2ML IV SOLN
INTRAVENOUS | Status: DC | PRN
  Administered 2017-01-10: 150 mg via INTRAVENOUS

## 2017-01-10 MED ORDER — FENTANYL CITRATE (PF) 100 MCG/2ML IJ SOLN
25.0000 ug | INTRAMUSCULAR | Status: DC | PRN
  Administered 2017-01-10: 50 ug via INTRAVENOUS
  Administered 2017-01-10: 25 ug via INTRAVENOUS

## 2017-01-10 MED ORDER — IPRATROPIUM-ALBUTEROL 0.5-2.5 (3) MG/3ML IN SOLN: 3.0000 mL | RESPIRATORY_TRACT | Status: DC | PRN

## 2017-01-10 MED ORDER — ALPRAZOLAM 0.25 MG PO TABS
0.1250 mg | ORAL_TABLET | Freq: Two times a day (BID) | ORAL | Status: DC
  Administered 2017-01-10 – 2017-01-13 (×6): 0.125 mg via ORAL
  Filled 2017-01-10 (×6): qty 1

## 2017-01-10 MED ORDER — BUPIVACAINE HCL (PF) 0.25 % IJ SOLN
INTRAMUSCULAR | Status: AC
  Filled 2017-01-10: qty 30

## 2017-01-10 MED ORDER — LIDOCAINE 2% (20 MG/ML) 5 ML SYRINGE
INTRAMUSCULAR | Status: AC
  Filled 2017-01-10: qty 5

## 2017-01-10 MED ORDER — THROMBIN 20000 UNITS EX SOLR
CUTANEOUS | Status: DC | PRN
  Administered 2017-01-10: 20 mL via TOPICAL

## 2017-01-10 MED ORDER — GABAPENTIN 250 MG/5ML PO SOLN
100.0000 mg | Freq: Three times a day (TID) | ORAL | Status: DC
  Administered 2017-01-10 – 2017-01-13 (×8): 100 mg via ORAL
  Filled 2017-01-10 (×9): qty 2

## 2017-01-10 MED ORDER — ACETAMINOPHEN 10 MG/ML IV SOLN
INTRAVENOUS | Status: AC
  Filled 2017-01-10: qty 100

## 2017-01-10 MED ORDER — MIDAZOLAM HCL 2 MG/2ML IJ SOLN
INTRAMUSCULAR | Status: AC
  Filled 2017-01-10: qty 2

## 2017-01-10 MED ORDER — ESTRADIOL 1 MG PO TABS
1.0000 mg | ORAL_TABLET | Freq: Every day | ORAL | Status: DC
  Administered 2017-01-11 – 2017-01-13 (×3): 1 mg via ORAL
  Filled 2017-01-10 (×3): qty 1

## 2017-01-10 MED ORDER — VANCOMYCIN HCL 1000 MG IV SOLR
INTRAVENOUS | Status: DC | PRN
  Administered 2017-01-10: 1000 mg via TOPICAL

## 2017-01-10 MED ORDER — PROMETHAZINE HCL 25 MG/ML IJ SOLN
INTRAMUSCULAR | Status: AC
  Filled 2017-01-10: qty 1

## 2017-01-10 MED ORDER — MORPHINE SULFATE (PF) 4 MG/ML IV SOLN
2.0000 mg | INTRAVENOUS | Status: DC | PRN
  Administered 2017-01-10 – 2017-01-12 (×3): 2 mg via INTRAVENOUS
  Filled 2017-01-10 (×3): qty 1

## 2017-01-10 MED ORDER — PANTOPRAZOLE SODIUM 40 MG PO TBEC
40.0000 mg | DELAYED_RELEASE_TABLET | Freq: Every day | ORAL | Status: DC
  Administered 2017-01-11 – 2017-01-13 (×3): 40 mg via ORAL
  Filled 2017-01-10 (×3): qty 1

## 2017-01-10 MED ORDER — ONDANSETRON HCL 4 MG PO TABS: 4.0000 mg | ORAL_TABLET | Freq: Four times a day (QID) | ORAL | Status: DC | PRN

## 2017-01-10 MED ORDER — PROPOFOL 10 MG/ML IV BOLUS
INTRAVENOUS | Status: DC | PRN
  Administered 2017-01-10: 80 mg via INTRAVENOUS

## 2017-01-10 MED ORDER — SODIUM CHLORIDE 0.9% FLUSH: 3.0000 mL | INTRAVENOUS | Status: DC | PRN

## 2017-01-10 MED ORDER — ACETAMINOPHEN 325 MG PO TABS
650.0000 mg | ORAL_TABLET | ORAL | Status: DC | PRN
  Administered 2017-01-12 (×2): 650 mg via ORAL
  Filled 2017-01-10 (×2): qty 2

## 2017-01-10 MED ORDER — BACITRACIN ZINC 500 UNIT/GM EX OINT
TOPICAL_OINTMENT | CUTANEOUS | Status: DC | PRN
  Administered 2017-01-10: 1 via TOPICAL

## 2017-01-10 MED ORDER — ALBUTEROL SULFATE (2.5 MG/3ML) 0.083% IN NEBU: 2.5000 mg | INHALATION_SOLUTION | Freq: Four times a day (QID) | RESPIRATORY_TRACT | Status: DC | PRN

## 2017-01-10 MED ORDER — 0.9 % SODIUM CHLORIDE (POUR BTL) OPTIME
TOPICAL | Status: DC | PRN
  Administered 2017-01-10: 1000 mL

## 2017-01-10 MED ORDER — PROMETHAZINE HCL 25 MG/ML IJ SOLN: 12.5000 mg | Freq: Four times a day (QID) | INTRAMUSCULAR | Status: DC | PRN

## 2017-01-10 MED ORDER — DEXAMETHASONE SODIUM PHOSPHATE 10 MG/ML IJ SOLN
10.0000 mg | INTRAMUSCULAR | Status: AC
  Administered 2017-01-10: 10 mg via INTRAVENOUS
  Filled 2017-01-10: qty 1

## 2017-01-10 MED ORDER — LACTATED RINGERS IV SOLN
INTRAVENOUS | Status: DC | PRN
  Administered 2017-01-10 (×2): via INTRAVENOUS

## 2017-01-10 MED ORDER — POTASSIUM CHLORIDE IN NACL 20-0.9 MEQ/L-% IV SOLN
INTRAVENOUS | Status: DC
  Administered 2017-01-10 – 2017-01-12 (×4): via INTRAVENOUS
  Filled 2017-01-10 (×5): qty 1000

## 2017-01-10 MED ORDER — PHENYLEPHRINE HCL 10 MG/ML IJ SOLN
INTRAMUSCULAR | Status: DC | PRN
  Administered 2017-01-10: 25 ug/min via INTRAVENOUS

## 2017-01-10 MED ORDER — GABAPENTIN 100 MG PO CAPS
100.0000 mg | ORAL_CAPSULE | Freq: Three times a day (TID) | ORAL | Status: DC
  Administered 2017-01-10: 100 mg via ORAL
  Filled 2017-01-10: qty 1

## 2017-01-10 MED ORDER — DEXAMETHASONE SODIUM PHOSPHATE 4 MG/ML IJ SOLN
4.0000 mg | Freq: Four times a day (QID) | INTRAMUSCULAR | Status: AC
  Administered 2017-01-10 – 2017-01-11 (×4): 4 mg via INTRAVENOUS
  Filled 2017-01-10 (×4): qty 1

## 2017-01-10 MED ORDER — BUPIVACAINE HCL (PF) 0.25 % IJ SOLN
INTRAMUSCULAR | Status: AC
  Filled 2017-01-10: qty 10

## 2017-01-10 SURGICAL SUPPLY — 69 items
BAG DECANTER FOR FLEXI CONT (MISCELLANEOUS) ×2 IMPLANT
BENZOIN TINCTURE PRP APPL 2/3 (GAUZE/BANDAGES/DRESSINGS) IMPLANT
BIT DRILL 3.5 SHORT (BIT) ×2 IMPLANT
BLADE CLIPPER SURG (BLADE) ×2 IMPLANT
BUR MATCHSTICK NEURO 3.0 LAGG (BURR) ×2 IMPLANT
CANISTER SUCT 3000ML PPV (MISCELLANEOUS) ×2 IMPLANT
CAP LOCKING (Cap) ×10 IMPLANT
CARTRIDGE OIL MAESTRO DRILL (MISCELLANEOUS) ×1 IMPLANT
DERMABOND ADVANCED (GAUZE/BANDAGES/DRESSINGS) ×2
DERMABOND ADVANCED .7 DNX12 (GAUZE/BANDAGES/DRESSINGS) ×2 IMPLANT
DIFFUSER DRILL AIR PNEUMATIC (MISCELLANEOUS) ×2 IMPLANT
DRAPE C-ARM 42X72 X-RAY (DRAPES) ×4 IMPLANT
DRAPE LAPAROTOMY 100X72 PEDS (DRAPES) ×2 IMPLANT
DRAPE POUCH INSTRU U-SHP 10X18 (DRAPES) ×2 IMPLANT
DRSG OPSITE 4X5.5 SM (GAUZE/BANDAGES/DRESSINGS) ×2 IMPLANT
DRSG OPSITE POSTOP 4X6 (GAUZE/BANDAGES/DRESSINGS) ×2 IMPLANT
DURAPREP 6ML APPLICATOR 50/CS (WOUND CARE) ×2 IMPLANT
ELECT REM PT RETURN 9FT ADLT (ELECTROSURGICAL) ×2
ELECTRODE REM PT RTRN 9FT ADLT (ELECTROSURGICAL) ×1 IMPLANT
EVACUATOR 1/8 PVC DRAIN (DRAIN) ×2 IMPLANT
GAUZE SPONGE 4X4 12PLY STRL (GAUZE/BANDAGES/DRESSINGS) IMPLANT
GAUZE SPONGE 4X4 16PLY XRAY LF (GAUZE/BANDAGES/DRESSINGS) IMPLANT
GLOVE BIO SURGEON STRL SZ7 (GLOVE) ×2 IMPLANT
GLOVE BIO SURGEON STRL SZ8 (GLOVE) ×2 IMPLANT
GLOVE BIOGEL PI IND STRL 6.5 (GLOVE) ×2 IMPLANT
GLOVE BIOGEL PI IND STRL 7.0 (GLOVE) ×1 IMPLANT
GLOVE BIOGEL PI IND STRL 7.5 (GLOVE) ×1 IMPLANT
GLOVE BIOGEL PI INDICATOR 6.5 (GLOVE) ×2
GLOVE BIOGEL PI INDICATOR 7.0 (GLOVE) ×1
GLOVE BIOGEL PI INDICATOR 7.5 (GLOVE) ×1
GLOVE EXAM NITRILE LRG STRL (GLOVE) IMPLANT
GLOVE EXAM NITRILE XL STR (GLOVE) IMPLANT
GLOVE EXAM NITRILE XS STR PU (GLOVE) IMPLANT
GLOVE SURG SS PI 6.5 STRL IVOR (GLOVE) ×4 IMPLANT
GOWN STRL REUS W/ TWL LRG LVL3 (GOWN DISPOSABLE) ×3 IMPLANT
GOWN STRL REUS W/ TWL XL LVL3 (GOWN DISPOSABLE) ×1 IMPLANT
GOWN STRL REUS W/TWL 2XL LVL3 (GOWN DISPOSABLE) IMPLANT
GOWN STRL REUS W/TWL LRG LVL3 (GOWN DISPOSABLE) ×3
GOWN STRL REUS W/TWL XL LVL3 (GOWN DISPOSABLE) ×1
HEMOSTAT POWDER KIT SURGIFOAM (HEMOSTASIS) ×2 IMPLANT
IMPL QUARTEX 3.5X12MM (Neuro Prosthesis/Implant) ×3 IMPLANT
IMPL QUARTEX 3.5X14MM (Neuro Prosthesis/Implant) ×6 IMPLANT
IMPLANT QUARTEX 3.5X12MM (Neuro Prosthesis/Implant) ×6 IMPLANT
IMPLANT QUARTEX 3.5X14MM (Neuro Prosthesis/Implant) ×12 IMPLANT
KIT BASIN OR (CUSTOM PROCEDURE TRAY) ×2 IMPLANT
KIT ROOM TURNOVER OR (KITS) ×2 IMPLANT
LOCKING CAP (Cap) ×20 IMPLANT
MARKER SKIN DUAL TIP RULER LAB (MISCELLANEOUS) IMPLANT
NEEDLE HYPO 18GX1.5 BLUNT FILL (NEEDLE) IMPLANT
NEEDLE HYPO 25X1 1.5 SAFETY (NEEDLE) ×2 IMPLANT
NEEDLE SPNL 20GX3.5 QUINCKE YW (NEEDLE) ×2 IMPLANT
NS IRRIG 1000ML POUR BTL (IV SOLUTION) ×2 IMPLANT
OIL CARTRIDGE MAESTRO DRILL (MISCELLANEOUS) ×2
PACK LAMINECTOMY NEURO (CUSTOM PROCEDURE TRAY) ×2 IMPLANT
PIN MAYFIELD SKULL DISP (PIN) ×2 IMPLANT
ROD SPINE CVD 3.5X60MM (Rod) ×4 IMPLANT
SCREW POLYAXIAL QUARTEX 4X14 (Screw) ×2 IMPLANT
SPONGE SURGIFOAM ABS GEL 100 (HEMOSTASIS) ×2 IMPLANT
STRIP CLOSURE SKIN 1/2X4 (GAUZE/BANDAGES/DRESSINGS) IMPLANT
SUT SILK 2 0 FS (SUTURE) ×2 IMPLANT
SUT VIC AB 0 CT1 18XCR BRD8 (SUTURE) ×2 IMPLANT
SUT VIC AB 0 CT1 8-18 (SUTURE) ×2
SUT VIC AB 2-0 CP2 18 (SUTURE) ×2 IMPLANT
SUT VIC AB 3-0 SH 8-18 (SUTURE) ×4 IMPLANT
TOWEL GREEN STERILE (TOWEL DISPOSABLE) ×2 IMPLANT
TOWEL GREEN STERILE FF (TOWEL DISPOSABLE) ×2 IMPLANT
TRAY FOLEY W/METER SILVER 16FR (SET/KITS/TRAYS/PACK) IMPLANT
UNDERPAD 30X30 (UNDERPADS AND DIAPERS) IMPLANT
WATER STERILE IRR 1000ML POUR (IV SOLUTION) ×2 IMPLANT

## 2017-01-10 NOTE — Progress Notes (Addendum)
Patient arrived to unit via PACU, vitals stable, dressing and hemovac intact. CHG bath given. Husband at bedside. Questions answered. Respiratory made aware of pt's arrival to floor with home trach.  Continue to monitor patient.

## 2017-01-10 NOTE — Anesthesia Procedure Notes (Signed)
Procedure Name: Intubation Date/Time: 01/10/2017 8:47 AM Performed by: Rebekah Chesterfield L Pre-anesthesia Checklist: Patient identified, Emergency Drugs available, Suction available, Patient being monitored and Timeout performed Patient Re-evaluated:Patient Re-evaluated prior to induction Oxygen Delivery Method: Simple face mask Preoxygenation: Pre-oxygenation with 100% oxygen Induction Type: IV induction Tube type: Reinforced Tube size: 5.5 mm Number of attempts: 2 Placement Confirmation: positive ETCO2 and breath sounds checked- equal and bilateral Comments: Pt c well established uncuffed trach due to vocal cord dysfunction   Preoxygenated by mask followed by iv induction. Attempted 6.0 reenforced ett s success 5.5 re-enforced ett placed c ease. =bbs + ETCO2 sutured in place by Dr. Seward Speck tegaderm and tape used to secure as well

## 2017-01-10 NOTE — H&P (Signed)
Subjective:   Patient is a 66 y.o. female admitted for neck pain with ue sxs and gait issues. The patient first presented to me with complaints of neck pain, arm pain and numbness of the arm(s). Onset of symptoms was several years ago. The pain is described as aching and occurs all day. The pain is rated severe, and is located in the neck and radiates to the arms. The symptoms have been progressive. Symptoms are exacerbated by extending head backwards, and are relieved by none.  Previous work up includes MRI of cervical spine, results: spinal stenosis.  Past Medical History:  Diagnosis Date  . Allergic rhinitis   . Anemia   . Asthma   . Complication of anesthesia    Breathing problems. 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  . Compressed cervical disc   . COPD (chronic obstructive pulmonary disease) ([place])   . CVA (cerebral infarction)   . Dyspnea   . Esophageal dysmotility   . Heart murmur    as child  . History of hiatal hernia   . IBS (irritable bowel syndrome)   . Migraine   . Problems with swallowing    intermittently  . Pulmonary fibrosis ([place])   . Shingles   . Stroke [facility])    slurred speech, drawn face, imaging normal, occurred twice, [facility]  . Tracheostomy in place [facility])   . Vocal cord paresis     Past Surgical History:  Procedure Laterality Date  . ABDOMINAL HYSTERECTOMY    . BOTOX INJECTION N/A 07/29/2013   Procedure: BOTOX INJECTION;  Surgeon: Jerene Bears, MD;  Location: [facility];  Service: Gastroenterology;  Laterality: N/A;  . BOTOX INJECTION N/A 05/04/2015   Procedure: BOTOX INJECTION;  Surgeon: Jerene Bears, MD;  Location: [facility];  Service: Gastroenterology;  Laterality: N/A;  . BREAST SURGERY  2002   bx of skin  . CHOLECYSTECTOMY    . COLONOSCOPY    . ESOPHAGEAL MANOMETRY N/A 12/16/2012   Procedure: ESOPHAGEAL MANOMETRY (EM);  Surgeon: Jerene Bears, MD;  Location: [facility];  Service: Gastroenterology;  Laterality: N/A;  . ESOPHAGOGASTRODUODENOSCOPY (EGD)  WITH PROPOFOL N/A 07/29/2013   Procedure: ESOPHAGOGASTRODUODENOSCOPY (EGD) WITH PROPOFOL;  Surgeon: Jerene Bears, MD;  Location: [facility];  Service: Gastroenterology;  Laterality: N/A;  with botox injection  . ESOPHAGOGASTRODUODENOSCOPY (EGD) WITH PROPOFOL N/A 05/04/2015   Procedure: ESOPHAGOGASTRODUODENOSCOPY (EGD) WITH PROPOFOL;  Surgeon: Jerene Bears, MD;  Location: [facility];  Service: Gastroenterology;  Laterality: N/A;  . EYE SURGERY     Catarct surgery 2014  . Eye Surgery AS Child    . JEJUNOSTOMY FEEDING TUBE     x2 both failed. no longer has  . MULTIPLE TOOTH EXTRACTIONS     2 teeth removed  . TRACHEOSTOMY  1996   done at [facility], Dr. Kathyrn Sheriff  . TUBAL LIGATION    . VIDEO BRONCHOSCOPY Bilateral 11/20/2012   Procedure: VIDEO BRONCHOSCOPY WITH FLUORO;  Surgeon: Juanito Doom, MD;  Location: [facility];  Service: Cardiopulmonary;  Laterality: Bilateral;    Allergies  Allergen Reactions  . Influenza Vaccines Shortness Of Breath and Other (See Comments)    Also causes her to have achy, flu-like symptoms  . Shellfish Allergy Anaphylaxis  . Quinolones Other (See Comments)    Feet swell  . Asa [Aspirin] Swelling and Other (See Comments)    Tongue swelling  . Ciprofloxacin Swelling and Other (See Comments)    ALL OF THE "FLOXACIN"  . Levaquin [Levofloxacin In D5w] Other (See Comments)  Aches and swelling  . Peanut-Containing Drug Products Other (See Comments)    Wheezing   . Septra [Sulfamethoxazole-Trimethoprim] Diarrhea  . Adhesive [Tape] Rash and Other (See Comments)    Paper works fine   . Eggs Or Egg-Derived Products Other (See Comments)    GI Upset    Social History  Substance Use Topics  . Smoking status: Never Smoker  . Smokeless tobacco: Never Used  . Alcohol use No    Family History  Problem Relation Age of Onset  . Asthma Cousin   . COPD Cousin   . Breast cancer Maternal Grandmother 60  . Cancer Maternal Grandmother        breast  . Asthma Father    . Cancer Father        lung  . Kidney cancer Father   . Cancer Paternal Uncle        lung  . COPD Paternal Grandfather   . Breast cancer Maternal Aunt 9  . Cancer Maternal Aunt        breast  . Bladder Cancer Neg Hx    Prior to Admission medications   Medication Sig Start Date End Date Taking? Authorizing Provider  acetaminophen (TYLENOL) 500 MG tablet Take 500 mg by mouth daily as needed for moderate pain or headache.   Yes [provider]  albuterol (PROVENTIL HFA;VENTOLIN HFA) 108 (90 Base) MCG/ACT inhaler Inhale 2 puffs into the lungs every 6 (six) hours as needed for wheezing or shortness of breath. 09/12/16  Yes [name], MD  ALPRAZolam Duanne Moron) 0.25 MG tablet TAKE ONE-HALF TABLET BY MOUTH TWICE DAILY AS NEEDED 11/30/16  Yes Cook, Jayce G, DO  ALPRAZolam (XANAX) 0.25 MG tablet Take 0.125 mg by mouth 2 (two) times daily.   Yes [provider]  cetirizine (ZYRTEC) 10 MG tablet TAKE ONE TABLET BY MOUTH EVERY DAY 11/30/16  Yes Cook, Jayce G, DO  EPINEPHrine (EPIPEN 2-PAK) 0.3 mg/0.3 mL IJ SOAJ injection Inject 0.3 mLs (0.3 mg total) into the muscle once. Patient taking differently: Inject 0.3 mg into the muscle as needed (allergic reaction).  02/10/15  Yes Jackolyn Confer, MD  estradiol (ESTRACE) 1 MG tablet TAKE ONE TABLET EVERY DAY 08/17/16  Yes [name], MD  fluticasone (FLONASE) 50 MCG/ACT nasal spray PLACE 2 SPRAYS INTO BOTH NOSTRILS DAILY. Patient taking differently: Place 2 sprays into both nostrils daily as needed for allergies.  12/22/15  Yes Mungal, Vishal, MD  furosemide (LASIX) 20 MG tablet Take 20 mg by mouth daily.   Yes [provider]  hydroxypropyl methylcellulose (ISOPTO TEARS) 2.5 % ophthalmic solution Place 1 drop into both eyes 3 (three) times daily as needed for dry eyes.   Yes [provider]  ibuprofen (ADVIL,MOTRIN) 200 MG tablet Take 800 mg by mouth 2 (two) times daily as needed for mild pain or moderate pain.     Yes [provider]  ipratropium-albuterol (DUONEB) 0.5-2.5 (3) MG/3ML SOLN Take 3 mLs by nebulization every 4 (four) hours as needed. Dx 496 06/06/13  Yes Juanito Doom, MD  Liniments [facility] PAIN RELIEF PATCH EX) Apply 1-2 patches topically daily as needed (shoulder pain).   Yes [provider]  montelukast (SINGULAIR) 10 MG tablet Take 10 mg by mouth at bedtime.    Yes [provider]  mupirocin ointment (BACTROBAN) 2 % Apply 1 application topically daily. To keep trach site from getting irritated   Yes [provider]  pantoprazole (PROTONIX) 40 MG tablet  Take 1 tablet (40 mg total) by mouth daily. 08/14/16  Yes Pyrtle, Lajuan Lines, MD  potassium chloride 20 MEQ/15ML (10%) SOLN Take 15 mLs (20 mEq total) by mouth daily as needed (cramps). 05/12/16  Yes [name], MD  sodium chloride (OCEAN) 0.65 % SOLN nasal spray Place 1 spray into both nostrils every 4 (four) hours as needed for congestion.    Yes [provider]  valACYclovir (VALTREX) 1000 MG tablet Take 1 tablet (1,000 mg total) by mouth 2 (two) times daily. 01/02/17  Yes [name], MD  clopidogrel (PLAVIX) 75 MG tablet TAKE ONE TABLET BY MOUTH EVERY MORNING Patient taking differently: TAKE 75 MG BY MOUTH EVERY MORNING 11/21/16   [name], MD  linaclotide [facility]) 72 MCG capsule Take 1 capsule (72 mcg total) by mouth daily before breakfast. Patient taking differently: Take 72 mcg by mouth daily as needed.  06/01/16   Pyrtle, Lajuan Lines, MD  Probiotic Product (PROBIOTIC PO) Take 1 tablet by mouth daily as needed (gi distress).     [provider]  rosuvastatin (CRESTOR) 5 MG tablet Take 1 tablet (5 mg total) by mouth daily. Patient not taking: Reported on 01/02/2017 12/14/16   [name], MD  zolmitriptan (ZOMIG-ZMT) 5 MG disintegrating tablet Take 1 tablet (5 mg total) by mouth daily as needed for migraine. 09/12/16   [name], MD     Review of  Systems  Positive ROS: neg  All other systems have been reviewed and were otherwise negative with the exception of those mentioned in the HPI and as above.  Objective: Vital signs in last 24 hours: Temp:  [98.2 F (36.8 C)] 98.2 F (36.8 C) (10/17 0701) Pulse Rate:  [72] 72 (10/17 0701) Resp:  [20] 20 (10/17 0701) BP: (130)/(50) 130/50 (10/17 0701) SpO2:  [100 %] 100 % (10/17 0701) Weight:  [47.9 kg (105 lb 11 oz)-47.9 kg (105 lb 11.2 oz)] 47.9 kg (105 lb 11 oz) (10/17 0701)  General Appearance: Alert, cooperative, no distress, appears stated age Head: Normocephalic, without obvious abnormality, atraumatic Eyes: PERRL, conjunctiva/corneas clear, EOM's intact      Neck: Supple, symmetrical, trachea midline, Back: Symmetric, no curvature, ROM normal, no CVA tenderness Lungs:  respirations unlabored Heart: Regular rate and rhythm Abdomen: Soft, non-tender Extremities: Extremities normal, atraumatic, no cyanosis or edema Pulses: 2+ and symmetric all extremities Skin: Skin color, texture, turgor normal, no rashes or lesions  NEUROLOGIC:  Mental status: Alert and oriented x4, no aphasia, good attention span, fund of knowledge and memory  Motor Exam - grossly normal Sensory Exam - grossly normal Reflexes: 1+ Coordination - grossly normal Gait - not tested Balance - grossly normal Cranial Nerves: I: smell Not tested  II: visual acuity  OS: nl    OD: nl  II: visual fields Full to confrontation  II: pupils Equal, round, reactive to light  III,VII: ptosis None  III,IV,VI: extraocular muscles  Full ROM  V: mastication Normal  V: facial light touch sensation  Normal  V,VII: corneal reflex  Present  VII: facial muscle function - upper  Normal  VII: facial muscle function - lower Normal  VIII: hearing Not tested  IX: soft palate elevation  Normal  IX,X: gag reflex Present  XI: trapezius strength  5/5  XI: sternocleidomastoid strength 5/5  XI: neck flexion strength  5/5  XII:  tongue strength  Normal    Data Review Lab Results  Component Value Date   WBC 5.7 01/04/2017  HGB 13.0 01/04/2017   HCT 39.4 01/04/2017   MCV 93.8 01/04/2017   PLT 221 01/04/2017   Lab Results  Component Value Date   NA 138 01/04/2017   K 3.9 01/04/2017   CL 103 01/04/2017   CO2 27 01/04/2017   BUN 9 01/04/2017   CREATININE 1.03 (H) 01/04/2017   GLUCOSE 89 01/04/2017   Lab Results  Component Value Date   INR 0.97 01/04/2017    Assessment:   Cervical neck pain with herniated nucleus pulposus/ 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 Patient has failed conservative therapy. Planned surgery : cervical laminectomy and instrumented fusion C3-7  Plan:   I explained the condition and procedure to the patient and answered any questions.  Patient wishes to proceed with procedure as planned. Understands risks/ benefits/ and expected or typical outcomes.  Anne [name] S 01/10/2017 8:05 AM

## 2017-01-10 NOTE — Progress Notes (Signed)
Patient ID: Anne [name], female   DOB: 07-Jul-1950, 66 y.o.   MRN: [id number] MRI of cervical spine reviewed by myself and by Dr. Saintclair Halsted together. We do not think there is a compressive lesion. There is a mild right C5-6 hyperintensity in the cord, which potentially could've been there before the decompression but unable to be visualized, or could be from reexpansion of the cord, but I would doubt this is from manipulation of the cord during the decompression. I was extremely careful during the decompression knowing she had limited reserve and significant cord compression, and so I feel it's a little less likely that this occurred during the decompression itself or even during the positioning. She continues to improve  Slightly each time I examine her. We will admit her to step down unit and start Decadron and Neurontin. I have talked to her husband at length. I think the prognosis is fairly good

## 2017-01-10 NOTE — Plan of Care (Signed)
Problem: Bladder/Genitourinary: Goal: Urinary functional status for postoperative course will improve Outcome: Progressing Patient voided and had small bowel movement on arrival to unit.

## 2017-01-10 NOTE — Transfer of Care (Signed)
Immediate Anesthesia Transfer of Care Note  Patient: Anne [name]  Procedure(s) Performed: LAMINECTOMY AND FORAMINOTOMY CERVICAL FOUR-CERVICAL FIVE, CERVICAL FIVE-SIX POSTERIOR CERVICAL INSTRUMENT FUSION CERVICAL THREE-CERVICAL SEVEN,CERVICAL LAMINECTOMY CERVICAL THREE-CERVICAL SEVEN. (N/A Spine Cervical)  Patient Location: PACU  Anesthesia Type:General  Level of Consciousness: awake, alert , oriented and patient cooperative  Airway & Oxygen Therapy: Patient Spontanous Breathing and Patient connected to nasal cannula oxygen  Post-op Assessment: Report given to RN, Post -op Vital signs reviewed and stable and Patient moving all extremities  Post vital signs: Reviewed and stable  Last Vitals:  Vitals:   01/10/17 0701  BP: (!) 130/50  Pulse: 72  Resp: 20  Temp: 36.8 C  SpO2: 100%    Last Pain:  Vitals:   01/10/17 0730  TempSrc:   PainSc: 5          Complications: No apparent anesthesia complications

## 2017-01-10 NOTE — Progress Notes (Signed)
Patient ID: Anne [name], female   DOB: [birth year], 66 y.o.   MRN: [id number] And was moving her right side well while being transported to the PACU and was giving me the thumbs up sign. I got a call about 45 minutes later that she was not moving her right side very well. Dr. Saintclair Halsted examined her, and I arrived a few minutes later. She was right hemiparetic. Her arm was worse than her leg. I do not notice significant facial weakness. She describes dysesthetic pain in the distal right upper extremity. There is some burning in the left upper extremity but her left arm is strong distally. Because of neck pain she will not use her deltoids. She is not flaccid on the right. She is almost antigravity in the deltoid, almost and tied gravity in the bicep where she can almost fully flex her arm slowly, her grip has improved over time to the point she can give me a thumbs-up and somewhat grip my finger at about 3 out of 5,and she has antigravity strength in the right quadricep, can wiggle her toes and ankle on the right. Left leg seems strong.  Her neurologic exam is slowly improving. She states she is slowly improving.This does not appear to be a hematoma. Therefore we decided not to take her back and explore her. We discussed CT scan versus MRI and Dr. Saintclair Halsted and I feel MRI is the best initial step. If this does not show signal change in the cord or some type of problem than a CT scan to look at a hardware would be next.  She did have significant stenosis and cord compression and it was worse on the right-hand side. The decompression went extremely well and was done extremely gently. The cord remained pulsatile as I could see it through the dura. I would be surprised if this was a cord injury from manipulation during the decompression. It could be a vascular phenomenon from expansion of the cord after decompression. It could be from positioning.I do not think this is a compressive lesion that we will know more after the  MRI. Again, we may need a CT scan to evaluate the hardware to make sure that the screws remain within bone and are not against neural elements laterally, but this does not seem to be a root problem. I have spoken with her husband at length. She is going to MRI now. She received Decadron. We continue to feel here that the Solu-Medrol protocol carries more risk than potential benefits.

## 2017-01-10 NOTE — Op Note (Signed)
01/10/2017  11:19 AM  PATIENT:  Anne [name]  66 y.o. female  PRE-OPERATIVE DIAGNOSIS:  Cervical spondylosis with cervical spinal stenosis, neck and arm pain and change in gait  POST-OPERATIVE DIAGNOSIS:  same  PROCEDURE:  1. Decompressive cervical laminectomy and medial facetectomy C3-C4 C5-C6 on the top of C7, 2. Lateral mass fusion C3-C7 utilizing locally harvested morselized autologous bone graft, 3. Segmental lateral mass fixation C3-C7 utilizing globus lateral mass screws  SURGEON:  Sherley Bounds, MD  ASSISTANTS: Shelba Flake fnp  ANESTHESIA:   General  EBL: 75 ml  Total I/O In: 1000 [I.V.:1000] Out: 75 [Blood:75]  BLOOD ADMINISTERED: none  DRAINS: Medium Hemovac  SPECIMEN:  none  INDICATION FOR PROCEDURE: This patient presented with neck and arm pain. Imaging showed multilevel cervical spondylosis with cervical spinal stenosis C3-4 C4-5 C5-6 and C6-7 with cord compression. The patient tried conservative measures without relief. Pain was debilitating. Recommended posterior cervical decompression and instrumented fusion. Patient understood the risks, benefits, and alternatives and potential outcomes and wished to proceed.  PROCEDURE DETAILS: The patient was brought to the operating room. Generalized endotracheal anesthesia was induced. The patient was affixed a 3 point [place] headrest and rolled into the prone position on chest rolls. All pressure points were padded. The posterior cervical region was cleaned and prepped with DuraPrep and then draped in the usual sterile fashion. 7 cc of local anesthesia was injected and a dorsal midline incision made in the posterior cervical region and carried down to the cervical fascia. The fascia was opened and the paraspinous musculature was taken down to expose C3-C7. Intraoperative fluoroscopy confirmed my level and then the dissection was carried out over the lateral facets. I localized the midpoint of each lateral mass and marked a region 1  mm medial to the midpoint of the lateral mass, and then drilled in an upward and outward direction into the safe zone of each lateral mass from C3-C7 inclusive. I drilled to a depth of 12 mm and then checked my drill hole with a ball probe. I then placed a 12 mm lateral mass screws into the safe zone of each lateral mass until they were 2 fingers tight. I then gently decompressed the central canal with the 1 and 2 mm Kerrison punch from C3 to top of C7. Medial facetectomies were performed. Once the decompression was complete the dura was full and capacious and I could see the spinal cord pulsatile through the dura. I then decorticated the lateral masses and the facet joints and packed them with local autograft to perform arthrodesis from C3-C7 bilaterally. I then placed rods into the multiaxial screw heads of the screws and locked these into position with the locking caps and anti-torque device. I then checked the final construct with AP/Lat fluoroscopy. I irrigated with saline solution containing bacitracin. I placed a medium Hemovac drain through separate stab incision, and lined the dura with Gelfoam. After hemostasis was achieved I closed the muscle and the fascia with 0 Vicryl, subcutaneous tissue with 2-0 Vicryl, and the subcuticular tissue with 3-0 Vicryl. The skin was closed with Dermabond. A sterile dressing was applied, the patient was turned to the supine position and taken out of the headrest, awakened from general anesthesia and transferred to the recovery room in stable condition. At the end of the procedure all sponge, needle and instrument counts were correct.    PLAN OF CARE: Admit to inpatient   PATIENT DISPOSITION:  PACU - hemodynamically stable.   Delay start of Pharmacological  VTE agent (>24hrs) due to surgical blood loss or risk of bleeding:  yes

## 2017-01-11 MED FILL — Thrombin For Soln Kit 20000 Unit: CUTANEOUS | Qty: 1 | Status: AC

## 2017-01-11 NOTE — Anesthesia Postprocedure Evaluation (Signed)
Anesthesia Post Note  Patient: Anne [name]  Procedure(s) Performed: LAMINECTOMY AND FORAMINOTOMY CERVICAL FOUR-CERVICAL FIVE, CERVICAL FIVE-SIX POSTERIOR CERVICAL INSTRUMENT FUSION CERVICAL THREE-CERVICAL SEVEN,CERVICAL LAMINECTOMY CERVICAL THREE-CERVICAL SEVEN. (N/A Spine Cervical)     Patient location during evaluation: PACU Anesthesia Type: General Level of consciousness: awake and alert Pain management: pain level controlled Vital Signs Assessment: post-procedure vital signs reviewed and stable Respiratory status: spontaneous breathing, nonlabored ventilation, respiratory function stable and patient connected to nasal cannula oxygen Cardiovascular status: blood pressure returned to baseline and stable Postop Assessment: no apparent nausea or vomiting Anesthetic complications: no Comments: To MRI for neck scan: R sided weakness    Last Vitals:  Vitals:   01/11/17 0010 01/11/17 0410  BP: (!) 128/52 (!) 125/47  Pulse: 72 74  Resp:    Temp: 37.1 C (P) 37.2 C  SpO2: 98% 98%    Last Pain:  Vitals:   01/11/17 0010  TempSrc: Oral  PainSc:                  Riccardo Dubin

## 2017-01-11 NOTE — Evaluation (Signed)
Physical Therapy Evaluation Patient Details Name: Anne [name] MRN: [id number] DOB: [birth year] Today's Date: 01/11/2017   History of Present Illness  66 y.o. female admitted on 01/10/17 for decompressive cervical laminectomy and medial facetectomy C3-C7 with lateral mass fusion C3-7.  PMH of vocal cord paralysis (s/p trach), CVA, recent shingles.   Clinical Impression  Pt was able to walk (3 times now) into the hallway with RW.  Her pain is under better control and seems to be recovering better now.  She has a significant number of stairs at home and will need to do stair training prior to d/c.   PT to follow acutely for deficits listed below.       Follow Up Recommendations Outpatient PT;DC plan and follow up therapy as arranged by surgeon    Equipment Recommendations  Rolling walker with 5" wheels    Recommendations for Other Services    NA    Precautions / Restrictions Precautions Precautions: Cervical;Fall Precaution Booklet Issued: Yes (comment) Precaution Comments: cervical precaution handout given Required Braces or Orthoses:  (no brace, pt interested in soft collar) Restrictions Weight Bearing Restrictions: No      Mobility  Bed Mobility Overal bed mobility: Needs Assistance Bed Mobility: Rolling;Sidelying to Sit Rolling: Supervision Sidelying to sit: Min assist       General bed mobility comments: Pt is OOB in the recliner chair already.   Transfers Overall transfer level: Needs assistance Equipment used: Rolling walker (2 wheeled) Transfers: Sit to/from Stand Sit to Stand: Min guard         General transfer comment: Min guard assist for safety reinforced safe hand placement during transitions.   Ambulation/Gait Ambulation/Gait assistance: Min assist Ambulation Distance (Feet): 130 Feet Assistive device: Rolling walker (2 wheeled) Gait Pattern/deviations: Step-through pattern Gait velocity: decreased Gait velocity interpretation: Below normal  speed for age/gender General Gait Details: Pt with great upright posture, light hands on RW, with fatigue bil knees did seem to get soft, but she was able to continue without too much concern for buckling.  Depending on d/c day/date and pt's comfort level, we may be able to gait without RW soon.          Balance Overall balance assessment: Needs assistance Sitting-balance support: Feet supported;No upper extremity supported Sitting balance-Leahy Scale: Good     Standing balance support: Bilateral upper extremity supported;No upper extremity supported;Single extremity supported Standing balance-Leahy Scale: Fair                               Pertinent Vitals/Pain Pain Assessment: Faces Faces Pain Scale: Hurts even more Pain Location: bil shoulders arms Pain Descriptors / Indicators: Burning;Pins and needles Pain Intervention(s): Limited activity within patient's tolerance;Monitored during session;Repositioned;Premedicated before session    [place] expects to be discharged to:: Private residence Living Arrangements: Spouse/significant other Available Help at Discharge: Family;Available 24 hours/day Type of Home: House Home Access: Stairs to enter Entrance Stairs-Rails: None Entrance Stairs-Number of Steps: 2 Home Layout: Multi-level;Bed/bath upstairs Home Equipment: None Additional Comments: tall bed    Prior Function Level of Independence: Independent               Hand Dominance   Dominant Hand: Right    Extremity/Trunk Assessment   Upper Extremity Assessment Upper Extremity Assessment: Defer to OT evaluation RUE Deficits / Details: Decreased AROM at shoulders can get to 90 degrees flexion AAROM; elbows distally WNL for AROM (grossly 3/5) and  finger tips are painful (pins and needles) RUE Coordination: decreased gross motor;decreased fine motor LUE Deficits / Details: Decreased AROM at shoulders can get to 90 degrees flexion  AAROM; elbows distally WNL for AROM (grossly 3/5) and finger tips are painful (pins and needles); burning sensation with shoulder movements LUE Coordination: decreased fine motor;decreased gross motor    Lower Extremity Assessment Lower Extremity Assessment: Generalized weakness (soft knees with fatigue, sensation impaired bil to LT)    Cervical / Trunk Assessment Cervical / Trunk Assessment: Other exceptions Cervical / Trunk Exceptions: post op  Communication   Communication: No difficulties  Cognition Arousal/Alertness: Awake/alert Behavior During Therapy: WFL for tasks assessed/performed Overall Cognitive Status: Within Functional Limits for tasks assessed                                           Exercises Other Exercises Other Exercises: Educated pt and husband on AAROM shoulder flexion up to 90 degrees   Assessment/Plan    PT Assessment Patient needs continued PT services  PT Problem List Decreased strength;Decreased activity tolerance;Decreased balance;Decreased mobility;Decreased knowledge of use of DME;Decreased knowledge of precautions;Impaired sensation;Pain       PT Treatment Interventions Gait training;DME instruction;Stair training;Functional mobility training;Therapeutic activities;Therapeutic exercise;Balance training;Patient/family education;Neuromuscular re-education;Modalities    PT Goals (Current goals can be found in the Care Plan section)  Acute Rehab PT Goals Patient Stated Goal: to get back to playing with her 10 grandkids PT Goal Formulation: With patient Time For Goal Achievement: 01/18/17 Potential to Achieve Goals: Good    Frequency Min 5X/week           AM-PAC PT "6 Clicks" Daily Activity  Outcome Measure Difficulty turning over in bed (including adjusting bedclothes, sheets and blankets)?: Unable Difficulty moving from lying on back to sitting on the side of the bed? : Unable Difficulty sitting down on and standing up  from a chair with arms (e.g., wheelchair, bedside commode, etc,.)?: Unable Help needed moving to and from a bed to chair (including a wheelchair)?: A Little Help needed walking in hospital room?: A Little Help needed climbing 3-5 steps with a railing? : A Little 6 Click Score: 12    End of Session Equipment Utilized During Treatment: Gait belt Activity Tolerance: Patient limited by pain Patient left: in chair;with call bell/phone within reach;with family/visitor present Nurse Communication: Mobility status PT Visit Diagnosis: Muscle weakness (generalized) (M62.81);Difficulty in walking, not elsewhere classified (R26.2);Other symptoms and signs involving the nervous system (Q75.916)    Time: 3846-6599 PT Time Calculation (min) (ACUTE ONLY): 26 min   Charges:           Wells Guiles B. Talissa Apple, PT, DPT [phone]   PT Evaluation $PT Eval Moderate Complexity: 1 Mod PT Treatments $Gait Training: 8-22 mins   01/11/2017, 4:10 PM

## 2017-01-11 NOTE — Evaluation (Signed)
Occupational Therapy Evaluation Patient Details Name: Anne [name] MRN: [id number] DOB: 1950/09/02 Today's Date: 01/11/2017    History of Present Illness Decompressive cervical laminectomy and medial facetectomy C3-C4 C5-C6 on the top of C7, Lateral mass fusion C3-C7. PHMx:Vocal cord paralysis-has Trach, CVA, recent shingles   Clinical Impression   This 66 yo female admitted and underwent above presents to acute OT with increased pain and decreased use of BIL UEs both affecting her PLOF of being Mod I to independent with basic ADLs. She will benefit from acute OT with follow up OPOT.   Follow Up Recommendations  Outpatient OT;Supervision/Assistance - 24 hour    Equipment Recommendations  None recommended by OT       Precautions / Restrictions Precautions Precautions: Cervical Required Braces or Orthoses:  (None) Restrictions Weight Bearing Restrictions: No      Mobility Bed Mobility Overal bed mobility: Needs Assistance Bed Mobility: Rolling;Sidelying to Sit Rolling: Supervision Sidelying to sit: Min assist       General bed mobility comments: VCs for sequence; no rail,HOB flat  Transfers Overall transfer level: Needs assistance Equipment used: Rolling walker (2 wheeled) Transfers: Sit to/from Stand Sit to Stand: Min guard              Balance Overall balance assessment: Needs assistance Sitting-balance support: No upper extremity supported;Feet supported Sitting balance-Leahy Scale: Good     Standing balance support: No upper extremity supported Standing balance-Leahy Scale: Fair                             ADL either performed or assessed with clinical judgement   ADL Overall ADL's : Needs assistance/impaired Eating/Feeding: Set up;Supervision/ safety;Sitting Eating/Feeding Details (indicate cue type and reason): increased time due to decreased AROM at shoulders Grooming: Maximal assistance;Sitting   Upper Body Bathing: Maximal  assistance;Sitting   Lower Body Bathing: Sit to/from stand;Minimal assistance Lower Body Bathing Details (indicate cue type and reason): min guard A sit<>stand Upper Body Dressing : Maximal assistance;Sitting   Lower Body Dressing: Minimal assistance;Sit to/from stand Lower Body Dressing Details (indicate cue type and reason): min guard A sit<>stand Toilet Transfer: Min guard;RW   Toileting- Clothing Manipulation and Hygiene: Moderate assistance Toileting - Clothing Manipulation Details (indicate cue type and reason): min guard A sit<>stand       General ADL Comments: We discussed the best option for showering would be to use walk in shower with seat to sit on and hand held shower head     Vision Patient Visual Report: No change from baseline              Pertinent Vitals/Pain Pain Assessment: 0-10 Pain Score: 8  Pain Location: Bil arms Pain Descriptors / Indicators: Burning;Pins and needles Pain Intervention(s): Limited activity within patient's tolerance;Monitored during session;Premedicated before session     Hand Dominance Right   Extremity/Trunk Assessment Upper Extremity Assessment Upper Extremity Assessment: RUE deficits/detail;LUE deficits/detail RUE Deficits / Details: Decreased AROM at shoulders can get to 90 degrees flexion AAROM; elbows distally WNL for AROM (grossly 3/5) and finger tips are painful (pins and needles) RUE Coordination: decreased gross motor;decreased fine motor LUE Deficits / Details: Decreased AROM at shoulders can get to 90 degrees flexion AAROM; elbows distally WNL for AROM (grossly 3/5) and finger tips are painful (pins and needles); burning sensation with shoulder movements LUE Coordination: decreased fine motor;decreased gross motor           Communication Communication  Communication: No difficulties   Cognition Arousal/Alertness: Awake/alert Behavior During Therapy: WFL for tasks assessed/performed Overall Cognitive Status: Within  Functional Limits for tasks assessed                                        Exercises Other Exercises Other Exercises: Educated pt and husband on AAROM shoulder flexion up to 90 degrees        Home Living Family/patient expects to be discharged to:: Private residence Living Arrangements: Spouse/significant other Available Help at Discharge: Family;Available 24 hours/day Type of Home: House Home Access: Stairs to enter [employer] of Steps: 2 Entrance Stairs-Rails: None Home Layout: Multi-level;Bed/bath upstairs   Alternate Level Stairs-Rails: Left Bathroom Shower/Tub: Walk-in shower;Door   [employer] Toilet: Standard Bathroom Accessibility: No   Home Equipment: None   Additional Comments: tall bed      Prior Functioning/Environment Level of Independence: Independent                 OT Problem List: Decreased strength;Decreased range of motion;Impaired balance (sitting and/or standing);Pain;Impaired UE functional use      OT Treatment/Interventions: Self-care/ADL training;Therapeutic exercise;Therapeutic activities;Patient/family education;Balance training;DME and/or AE instruction    OT Goals(Current goals can be found in the care plan section) Acute Rehab OT Goals Patient Stated Goal: for this pain to get better in my arms and to go home OT Goal Formulation: With patient/family Time For Goal Achievement: 01/18/17 Potential to Achieve Goals: Good  OT Frequency: Min 3X/week              AM-PAC PT "6 Clicks" Daily Activity     Outcome Measure Help from another person eating meals?: A Lot Help from another person taking care of personal grooming?: A Lot Help from another person toileting, which includes using toliet, bedpan, or urinal?: A Little Help from another person bathing (including washing, rinsing, drying)?: A Lot Help from another person to put on and taking off regular upper body clothing?: A Lot Help from another person  to put on and taking off regular lower body clothing?: A Little 6 Click Score: 14   End of Session Equipment Utilized During Treatment: Gait belt;Rolling walker Nurse Communication: Mobility status  Activity Tolerance: Patient tolerated treatment well Patient left: in chair;with call bell/phone within reach;with family/visitor present  OT Visit Diagnosis: Unsteadiness on feet (R26.81);Muscle weakness (generalized) (M62.81);Pain Pain - Right/Left:  (both) Pain - part of body:  (shoulder and finger tips)                Time: 7824-2353 OT Time Calculation (min): 42 min Charges:  OT General Charges $OT Visit: 1 Visit OT Evaluation $OT Eval Moderate Complexity: 1 Mod OT Treatments $Self Care/Home Management : 8-22 mins $Therapeutic Exercise: 8-22 mins [place], OTR/L [phone] 01/11/2017

## 2017-01-11 NOTE — Progress Notes (Signed)
Patient ID: Anne [name], female   DOB: Aug 14, 1950, 66 y.o.   MRN: [id number] Subjective: Patient reports significant improvement, feels strength is essentially normal, pain much better, neck sore, walked with PT, feeding herself  Objective: Vital signs in last 24 hours: Temp:  [98.3 F (36.8 C)-98.9 F (37.2 C)] 98.4 F (36.9 C) (10/18 1156) Pulse Rate:  [65-81] 81 (10/18 1156) Resp:  [12-19] 16 (10/17 2259) BP: (116-141)/(46-76) 136/56 (10/18 1156) SpO2:  [95 %-100 %] 99 % (10/18 1203)  Intake/Output from previous day: 10/17 0701 - 10/18 0700 In: 2307.5 [P.O.:120; I.V.:2087.5; IV Piggyback:100] Out: 195 [Drains:120; Blood:75] Intake/Output this shift: Total I/O In: 120 [P.O.:120] Out: 850 [Urine:850]  Neurologic: Grossly normal  Lab Results: Lab Results  Component Value Date   WBC 5.7 01/04/2017   HGB 13.0 01/04/2017   HCT 39.4 01/04/2017   MCV 93.8 01/04/2017   PLT 221 01/04/2017   Lab Results  Component Value Date   INR 0.97 01/04/2017   BMET Lab Results  Component Value Date   NA 138 01/04/2017   K 3.9 01/04/2017   CL 103 01/04/2017   CO2 27 01/04/2017   GLUCOSE 89 01/04/2017   BUN 9 01/04/2017   CREATININE 1.03 (H) 01/04/2017   CALCIUM 9.5 01/04/2017    Studies/Results: Chest 2 View  Result Date: 01/10/2017 CLINICAL DATA:  Laminectomy. EXAM: CHEST  2 VIEW COMPARISON:  Radiographs of September 12, 2016. FINDINGS: The heart size and mediastinal contours are within normal limits. Tracheostomy tube is in grossly good position. No pneumothorax or pleural effusion is noted. Stable biapical scarring is noted. No acute abnormality is noted. The visualized skeletal structures are unremarkable. IMPRESSION: Tracheostomy in grossly good position. Stable biapical scarring. No significant change compared to prior exam. Electronically Signed   By: Marijo Conception, M.D.   On: 01/10/2017 07:38   Dg Cervical Spine 2-3 Views  Result Date: 01/10/2017 CLINICAL DATA:   Posterior cervical fusion. EXAM: CERVICAL SPINE - 2-3 VIEW; DG C-ARM 61-120 MIN COMPARISON:  MRI 01/03/2017. FINDINGS: Intraoperative radiographs demonstrate changes related to laminectomy facetectomy from C3 through C7 with lateral mass fusion using screws and rods. Grossly satisfactory position and alignment. IMPRESSION: As above. Electronically Signed   By: Staci Righter M.D.   On: 01/10/2017 11:25   Mr Cervical Spine Wo Contrast  Result Date: 01/10/2017 CLINICAL DATA:  Postoperative right-sided weakness following posterior spinal fusion. EXAM: MRI CERVICAL SPINE WITHOUT CONTRAST TECHNIQUE: Multiplanar, multisequence MR imaging of the cervical spine was performed. No intravenous contrast was administered. COMPARISON:  Cervical spine MRI 01/03/2017 Cervical spine fluoroscopy 01/10/2017 FINDINGS: Alignment: Grade 1 retrolisthesis at C4-C5 and C5-C6 is unchanged. Vertebrae: There has been posterior decompression at the C3-C6 levels with C3-C7 posterior spinal fusion. 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 Otherwise normal cord signal. Posterior Fossa, vertebral arteries, paraspinal tissues: Negative. Disc levels: C1-C2: Unremarkable. C2-C3: Small disc bulge and ligamentum flavum redundancy cause mild spinal canal stenosis. No neural foraminal stenosis. C3-C4: Posterior decompression with wide patency of the thecal sac. No foraminal stenosis. C4-C5: Posterior decompression with widely patent thecal sac. Mild-to-moderate right neural foraminal stenosis. Mild central disc protrusion. C5-C6: Medium-sized central disc protrusion. Status post posterior decompression with wide patency of the thecal sac. Mild hyperintense T2 weighted signal within the right hemicord. Moderate right neural foraminal stenosis. C6-C7: Disc osteophyte complex and mild facet hypertrophy resulting in moderate spinal canal stenosis with  mild flattening of the cord. C7-T1: Normal disc space and facets. No spinal canal or neuroforaminal stenosis. IMPRESSION: 1. 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 2. Moderate spinal canal stenosis at C6-7 with flattening of cord, but no signal change. 3. Mild spinal canal stenosis is CT C3. 4. Moderate right neural foraminal stenosis at C5-C6 and mild-to-moderate right foraminal stenosis at C4-C5. Electronically Signed   By: Ulyses Jarred M.D.   On: 01/10/2017 14:12   Dg C-arm 1-60 Min  Result Date: 01/10/2017 CLINICAL DATA:  Posterior cervical fusion. EXAM: CERVICAL SPINE - 2-3 VIEW; DG C-ARM 61-120 MIN COMPARISON:  MRI 01/03/2017. FINDINGS: Intraoperative radiographs demonstrate changes related to laminectomy facetectomy from C3 through C7 with lateral mass fusion using screws and rods. Grossly satisfactory position and alignment. IMPRESSION: As above. Electronically Signed   By: Staci Righter M.D.   On: 01/10/2017 11:25    Assessment/Plan: Really looks good, strength has normalized, pain fairly well controlled   LOS: 1 day    Anne [name] 01/11/2017, 12:13 PM

## 2017-01-12 ENCOUNTER — Encounter (HOSPITAL_COMMUNITY): Payer: Self-pay | Admitting: Neurological Surgery

## 2017-01-12 MED ORDER — DEXAMETHASONE SODIUM PHOSPHATE 4 MG/ML IJ SOLN
4.0000 mg | Freq: Four times a day (QID) | INTRAMUSCULAR | Status: AC
  Administered 2017-01-12 – 2017-01-13 (×4): 4 mg via INTRAVENOUS
  Filled 2017-01-12 (×4): qty 1

## 2017-01-12 NOTE — Progress Notes (Signed)
Patient ID: Anne [name], female   DOB: 1950-11-12, 66 y.o.   MRN: [id number] Subjective: Patient reports neck pain, hands better  Objective: Vital signs in last 24 hours: Temp:  [97.7 F (36.5 C)-98.6 F (37 C)] 97.9 F (36.6 C) (10/19 0410) Pulse Rate:  [69-81] 71 (10/19 0545) Resp:  [16] 16 (10/19 0318) BP: (117-143)/(56-61) 117/57 (10/19 0545) SpO2:  [98 %-100 %] 98 % (10/19 0545)  Intake/Output from previous day: 10/18 0701 - 10/19 0700 In: 2310 [P.O.:360; I.V.:1950] Out: 1640 [Urine:1600; Drains:40] Intake/Output this shift: No intake/output data recorded.  Neurologic: Grossly normal  Lab Results: Lab Results  Component Value Date   WBC 5.7 01/04/2017   HGB 13.0 01/04/2017   HCT 39.4 01/04/2017   MCV 93.8 01/04/2017   PLT 221 01/04/2017   Lab Results  Component Value Date   INR 0.97 01/04/2017   BMET Lab Results  Component Value Date   NA 138 01/04/2017   K 3.9 01/04/2017   CL 103 01/04/2017   CO2 27 01/04/2017   GLUCOSE 89 01/04/2017   BUN 9 01/04/2017   CREATININE 1.03 (H) 01/04/2017   CALCIUM 9.5 01/04/2017    Studies/Results: Dg Cervical Spine 2-3 Views  Result Date: 01/10/2017 CLINICAL DATA:  Posterior cervical fusion. EXAM: CERVICAL SPINE - 2-3 VIEW; DG C-ARM 61-120 MIN COMPARISON:  MRI 01/03/2017. FINDINGS: Intraoperative radiographs demonstrate changes related to laminectomy facetectomy from C3 through C7 with lateral mass fusion using screws and rods. Grossly satisfactory position and alignment. IMPRESSION: As above. Electronically Signed   By: Staci Righter M.D.   On: 01/10/2017 11:25   Mr Cervical Spine Wo Contrast  Result Date: 01/10/2017 CLINICAL DATA:  Postoperative right-sided weakness following posterior spinal fusion. EXAM: MRI CERVICAL SPINE WITHOUT CONTRAST TECHNIQUE: Multiplanar, multisequence MR imaging of the cervical spine was performed. No intravenous contrast was administered. COMPARISON:  Cervical spine MRI 01/03/2017  Cervical spine fluoroscopy 01/10/2017 FINDINGS: Alignment: Grade 1 retrolisthesis at C4-C5 and C5-C6 is unchanged. Vertebrae: There has been posterior decompression at the C3-C6 levels with C3-C7 posterior spinal fusion. 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 Otherwise normal cord signal. Posterior Fossa, vertebral arteries, paraspinal tissues: Negative. Disc levels: C1-C2: Unremarkable. C2-C3: Small disc bulge and ligamentum flavum redundancy cause mild spinal canal stenosis. No neural foraminal stenosis. C3-C4: Posterior decompression with wide patency of the thecal sac. No foraminal stenosis. C4-C5: Posterior decompression with widely patent thecal sac. Mild-to-moderate right neural foraminal stenosis. Mild central disc protrusion. C5-C6: Medium-sized central disc protrusion. Status post posterior decompression with wide patency of the thecal sac. Mild hyperintense T2 weighted signal within the right hemicord. Moderate right neural foraminal stenosis. C6-C7: Disc osteophyte complex and mild facet hypertrophy resulting in moderate spinal canal stenosis with mild flattening of the cord. C7-T1: Normal disc space and facets. No spinal canal or neuroforaminal stenosis. IMPRESSION: 1. 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 2. Moderate spinal canal stenosis at C6-7 with flattening of cord, but no signal change. 3. Mild spinal canal stenosis is CT C3. 4. Moderate right neural foraminal stenosis at C5-C6 and mild-to-moderate right foraminal stenosis at C4-C5. Electronically Signed   By: Ulyses Jarred M.D.   On: 01/10/2017 14:12   Dg C-arm 1-60 Min  Result Date: 01/10/2017 CLINICAL DATA:  Posterior cervical fusion. EXAM: CERVICAL SPINE - 2-3 VIEW; DG C-ARM 61-120 MIN COMPARISON:  MRI 01/03/2017. FINDINGS: Intraoperative  radiographs demonstrate changes related to laminectomy facetectomy from C3 through C7 with lateral mass fusion using screws and rods. Grossly satisfactory position and alignment. IMPRESSION: As above. Electronically Signed   By: Staci Righter M.D.   On: 01/10/2017 11:25    Assessment/Plan: Stable, will order collar   LOS: 2 days    Selby Slovacek S 01/12/2017, 7:45 AM

## 2017-01-12 NOTE — Care Management Note (Signed)
Case Management Note  Patient Details  Name: KAISYN REINHOLD MRN: [id number] Date of Birth: [birth year]  Subjective/Objective:                  66 y.o. female admitted on 01/10/17 for decompressive cervical laminectomy and medial facetectomy C3-C7 with lateral mass fusion C3-7.  PTA, pt independent, lives at home with spouse.    Action/Plan: PT/OT recommending OP follow up, though pt/spouse prefer home services initially, as pt concerned about pain with transport.  MD has ordered [facility] services.  Referral to [facility] for [facility] follow up , per pt choice.  Start of care 24-48h post dc date.  Referral to [facility] for DME needs; RW to be delivered to pt prior to dc home.    Expected Discharge Date:                  Expected Discharge Plan:  [place]  In-House Referral:     Discharge planning Services  CM Consult  Post Acute Care Choice:  Home Health Choice offered to:  Patient, Spouse  DME Arranged:  [facility] Agency:  [place] Arranged:  PT, OT [facility] Agency:  [place]  Status of Service:  Completed, signed off  If discussed at [place] of Stay Meetings, dates discussed:    Additional Comments:  Reinaldo Raddle, RN, BSN  Trauma/Neuro ICU Case Manager [phone]

## 2017-01-12 NOTE — Progress Notes (Signed)
[name] site is clean. Pt is on RA no distress noted. Pt performing IS on arrival. SATs are stable

## 2017-01-12 NOTE — Progress Notes (Signed)
Occupational Therapy Treatment Patient Details Name: Anne [name] MRN: [id number] DOB: Apr 21, 1950 Today's Date: 01/12/2017    History of present illness 66 y.o. female admitted on 01/10/17 for decompressive cervical laminectomy and medial facetectomy C3-C7 with lateral mass fusion C3-7.  PMH of vocal cord paralysis (s/p trach), CVA, recent shingles.    OT comments  This 66 yo female seen today with focus on Bil UE use and strengthening. Issued pt yellow theraputty and red tubing to help with strengthening and basic ADLs.   Follow Up Recommendations  Home health OT;Supervision/Assistance - 24 hour    Equipment Recommendations  None recommended by OT       Precautions / Restrictions Precautions Precautions: Cervical;Fall Restrictions Weight Bearing Restrictions: No       Mobility Bed Mobility                  Transfers                      Balance                                           ADL either performed or assessed with clinical judgement   ADL                                         General ADL Comments: Was going to practice with clothing, but husband has not had a chance to bring clothing in yet. Pt reports when her fingers are really hurting it is hard for her to hold utensils so issued pt red tubing which she reports helps due to it being soft.     Vision Patient Visual Report: No change from baseline            Cognition Arousal/Alertness: Awake/alert Behavior During Therapy: WFL for tasks assessed/performed Overall Cognitive Status: Within Functional Limits for tasks assessed                                          Exercises Other Exercises Other Exercises: Pt reports she has been doing shoulder exercises with husband. Instructed today on theraputty exercises with yellow putty and FM activities on handout with also encouraging her to use her arms for all basic self care.            Pertinent Vitals/ Pain       Pain Assessment: 0-10 Pain Score: 6  Pain Location: bil shoulders arms Pain Descriptors / Indicators: Burning;Pins and needles (burning in shoulders and pins-needles in finger tips) Pain Intervention(s): Limited activity within patient's tolerance;Monitored during session;Repositioned         Frequency  Min 3X/week        Progress Toward Goals  OT Goals(current goals can now be found in the care plan section)  Progress towards OT goals: Progressing toward goals     Plan Discharge plan needs to be updated       AM-PAC PT "6 Clicks" Daily Activity     Outcome Measure   Help from another person eating meals?: A Little Help from another person taking care of personal grooming?: A Lot Help from another person toileting, which includes using  toliet, bedpan, or urinal?: A Little Help from another person bathing (including washing, rinsing, drying)?: A Lot Help from another person to put on and taking off regular upper body clothing?: A Lot Help from another person to put on and taking off regular lower body clothing?: A Little 6 Click Score: 15    End of Session    Pain - Right/Left:  (both) Pain - part of body:  (shoulders and finger tips)   Activity Tolerance Patient tolerated treatment well   Patient Left in chair;with call bell/phone within reach;with family/visitor present           Time: 1240-1317 OT Time Calculation (min): 37 min  Charges: OT General Charges $OT Visit: 1 Visit OT Treatments $Therapeutic Activity: 23-37 mins  [place], OTR/L [phone] 01/12/2017

## 2017-01-13 MED ORDER — METHOCARBAMOL 500 MG PO TABS: 500.0000 mg | ORAL_TABLET | Freq: Four times a day (QID) | ORAL | 0 refills | Status: DC | PRN

## 2017-01-13 MED ORDER — DEXAMETHASONE 4 MG PO TABS: 4.0000 mg | ORAL_TABLET | Freq: Two times a day (BID) | ORAL | 0 refills | Status: DC

## 2017-01-13 MED ORDER — GABAPENTIN 300 MG PO CAPS: 300.0000 mg | ORAL_CAPSULE | Freq: Three times a day (TID) | ORAL | 2 refills | Status: DC

## 2017-01-13 MED ORDER — OXYCODONE HCL 5 MG PO TABS: 5.0000 mg | ORAL_TABLET | Freq: Four times a day (QID) | ORAL | 0 refills | Status: DC | PRN

## 2017-01-13 NOTE — Discharge Summary (Signed)
Physician Discharge Summary  Patient ID: Anne [name] MRN: [id number] DOB/AGE: 11-29-1950 66 y.o.  Admit date: 01/10/2017 Discharge date: 01/13/2017  Admission Diagnoses:Cervical stenosis  Discharge Diagnoses: same Active Problems:   S/P cervical spinal fusion   Discharged Condition: good  Hospital Course: [name] was admitted and taken to the operating room for an uncomplicated cervical laminectomy and subsequent instrumented arthrodesis. Immediately post op she experienced weakness on the right side. An MRI was performed showing decompression of the cord, possible signal in the cord, but at this time her symptoms were resolving and her neurologic exam was improving. She continued to improve through her hospitalization. At discharge she is ambulating, voiding, and tolerating a regular diet. The wound is clean, dry, and without signs of infection.   Treatments: surgery:  1. Decompressive cervical laminectomy and medial facetectomy C3-C4 C5-C6 on the top of C7, 2. Lateral mass fusion C3-C7 utilizing locally harvested morselized autologous bone graft, 3. Segmental lateral mass fixation C3-C7 utilizing globus lateral mass screws     Discharge Exam: Blood pressure (!) 162/70, pulse 77, temperature 97.7 F (36.5 C), temperature source Oral, resp. rate 20, height 5' 3.5" (1.613 m), weight 47.9 kg (105 lb 11 oz), SpO2 99 %. General appearance: alert, cooperative, appears stated age and no distress Neurologic: Motor: weakness in right grip, intrinsics.  Disposition: 01-Home or Self Care Stenosis Discharge Instructions    Face-to-face encounter (required for Medicare/Medicaid patients)    Complete by:  As directed    I [name] L certify that this patient is under my care and that I, or a nurse practitioner or physician's assistant working with me, had a face-to-face encounter that meets the physician face-to-face encounter requirements with this patient on 01/13/2017. The encounter  with the patient was in whole, or in part for the following medical condition(s) which is the primary reason for home health care (List medical condition): cervical stenosis with myelopathy   The encounter with the patient was in whole, or in part, for the following medical condition, which is the primary reason for home health care:  cervical stenosis   I certify that, based on my findings, the following services are medically necessary home health services:  Physical therapy   Reason for Medically Necessary Home Health Services:  Therapy- Personnel officer, Public librarian   My clinical findings support the need for the above services:  Unable to leave home safely without assistance and/or assistive device   Further, I certify that my clinical findings support that this patient is homebound due to:  Unable to leave home safely without assistance   Home Health    Complete by:  As directed    To provide the following care/treatments:   PT OT        Follow-up Information    Eustace Moore, MD Follow up in 2 week(s).   Specialty:  Neurosurgery Contact information: [address] [place] [address] [phone]           Signed: Winfield [name] 01/13/2017, 9:05 AM

## 2017-01-13 NOTE — Discharge Instructions (Signed)
Cervical Fusion Cervical fusion is a procedure that is done on bones in the neck (cervical spine) to relieve pressure on the spinal cord or one or more nerve roots. There are two types of cervical fusion:  Posterior cervical fusion. This surgery is done through the back (posterior) of the neck. The goal of this procedure is to make two or more of the bones in the spinal column (vertebrae) grow together (fuse). This procedure is most commonly used to treat neck fractures and dislocations and to fix deformities in the curve of the neck.  Anterior cervical fusion. This surgery is done through the front (anterior) part of the neck. This procedure is most commonly used to treat herniated cervical disk, degenerative cervical disease, and arthritis in the cervical spine. During this type of surgery: ? The affected disk between the two vertebrae (intervertebral disk) is removed, as well as any extra bone on the edges of the vertebrae (bone spurs). This takes pressure off of the nerves or spinal cord (decompression). ? The area where the disk was removed is filled with a bone material (graft) or artificial bone material (implant) and then it fuses over time.  In either procedure, hardware such as rods, screws, metal plates, or cages may be inserted to stabilize the vertebrae while they heal. Tell a health care provider about:  Any allergies you have.  All medicines you are taking, including vitamins, herbs, eye drops, creams, and over-the-counter medicines.  Any problems you or family members have had with anesthetic medicines.  Any blood disorders you have.  Any surgeries you have had.  Any medical conditions you have.  Whether you are pregnant or may be pregnant. What are the risks? Generally, this is a safe procedure. However, problems may occur, including:  Infection.  Bleeding.  Allergic reactions to medicines or dyes.  Damage to other structures or organs, such as nerves near the  spine.  Spinal fluid leakage.  Blood clots.  Trouble controlling urination or bowel movements.  Vertebrae not fusing together completely (pseudoarthrosis).  Temporary hoarseness of the voice.  Difficulty swallowing.  What happens before the procedure? Staying hydrated Follow instructions from your health care provider about hydration, which may include:  Up to 2 hours before the procedure - you may continue to drink clear liquids, such as water, clear fruit juice, black coffee, and plain tea.  Eating and drinking restrictions Follow instructions from your health care provider about eating and drinking, which may include:  8 hours before the procedure - stop eating heavy meals or foods such as meat, fried foods, or fatty foods.  6 hours before the procedure - stop eating light meals or foods, such as toast or cereal.  6 hours before the procedure - stop drinking milk or drinks that contain milk.  2 hours before the procedure - stop drinking clear liquids.  Medicines  Ask your health care provider about: ? Changing or stopping your regular medicines. This is especially important if you are taking diabetes medicines or blood thinners. ? Taking medicines such as aspirin and ibuprofen. These medicines can thin your blood. Do not take these medicines before your procedure if your health care provider instructs you not to.  You may be given antibiotic medicine to help prevent infection. General instructions  Ask your health care provider how your surgical site will be marked or identified.  You will have blood and urine samples taken.  You may have tests, such as: ? X-rays. ? CT scan. ? MRI.  Plan to have someone take you home from the hospital or clinic.  If you will be going home right after the procedure, plan to have someone with you for 24 hours. What happens during the procedure?  To reduce your risk of infection: ? Your health care team will wash or sanitize  their hands. ? Your skin will be washed with soap. ? Hair may be removed from the surgical area.  An IV tube will be inserted into one of your veins.  You will be given one or more of the following: ? A medicine to help you relax (sedative). ? A medicine to make you fall asleep (general anesthetic).  If you are having a posterior cervical fusion: ? An incision will be made in the back of your neck. ? Two or more vertebrae will be joined into one solid section of bone with a bone graft.  If you are having an anterior cervical fusion: ? An incision will be made in the area where the fusion will be placed. This isusually done at the front of your neck. ? Your neck muscles will be pushed aside. ? The affected disk and bone spurs will be removed (decompression). ? The area where the disk was removed will then be filled with bone graft or bone implants or both.  Screws and rods or metal plates may be used to stabilize the vertebrae while they fuse.  Your incision may be closed.  A bandage (dressing) may be placed over your incision. The procedure may vary among health care providers and hospitals. What happens after the procedure?  You will be given medicine to relieve pain as needed.  You will continue to receive fluids and medicines through an IV tube.  Your blood pressure, heart rate, breathing rate, and blood oxygen level will be monitored until the medicines you were given have worn off.  You may be given a brace to wear while you heal.  Do not drive until your health care provider approves.  You may have to wear compression stockings. These stockings help to prevent blood clots and reduce swelling in your legs.  You may be asked to do breathing exercises. This helps prevent a lung infection.  You will be encouraged to stand up and walk as soon as you are able. You will be taught how to move correctly and how to stand and walk.  You will be assisted to turn in bed  frequently by moving your whole body without twisting your back (log rolling technique). This information is not intended to replace advice given to you by your health care provider. Make sure you discuss any questions you have with your health care provider. Document Released: 09/02/2001 Document Revised: 10/06/2015 Document Reviewed: 09/22/2015 Elsevier Interactive Patient Education  [employer].

## 2017-01-13 NOTE — Progress Notes (Signed)
Patient IV removed. Discharge instructions given, questions answered. Taken to car via wheelchair.

## 2017-01-13 NOTE — Progress Notes (Signed)
Physical Therapy Treatment/ Discharge Patient Details Name: Anne [name] MRN: [id number] DOB: 1950-07-21 Today's Date: 01/13/2017    History of Present Illness 66 y.o. female admitted on 01/10/17 for decompressive cervical laminectomy and medial facetectomy C3-C7 with lateral mass fusion C3-7.  PMH of vocal cord paralysis (s/p trach), CVA, recent shingles.     PT Comments    Pt moving well and states that use of cervical collar has dramatically increased her ability to function and feel comfortable with neck positioning. Pt reports pain in Right hand and being cautious of movement but did not require assist for any transfers or gait and able to perform long hall gait and stairs without assist. Pt aware of restrictions and does not require further physical therapy at this time and will defer to OT. Signing off with pt aware and agreeable.     Follow Up Recommendations  No PT follow up (defer UE strengthening to OT)     Equipment Recommendations  None recommended by PT    Recommendations for Other Services       Precautions / Restrictions Precautions Precautions: Cervical;Fall Required Braces or Orthoses: Cervical Brace Cervical Brace: Hard collar;For comfort    Mobility  Bed Mobility   Bed Mobility: Rolling;Sidelying to Sit Rolling: Modified independent (Device/Increase time) Sidelying to sit: Modified independent (Device/Increase time)       General bed mobility comments: pt able to complete bed mobility without cues or assist  Transfers Overall transfer level: Modified independent                  Ambulation/Gait Ambulation/Gait assistance: Independent Ambulation Distance (Feet): 1000 Feet Assistive device: None Gait Pattern/deviations: WFL(Within Functional Limits)     General Gait Details: pt with good posture, speed and stability   Stairs Stairs: Yes   Stair Management: Alternating pattern;Forwards Number of Stairs: 10 General stair comments:  no cues or assist required  Wheelchair Mobility    Modified Rankin (Stroke Patients Only)       Balance Overall balance assessment: No apparent balance deficits (not formally assessed)                                          Cognition Arousal/Alertness: Awake/alert Behavior During Therapy: WFL for tasks assessed/performed Overall Cognitive Status: Within Functional Limits for tasks assessed                                        Exercises      General Comments        Pertinent Vitals/Pain Pain Assessment: No/denies pain    Home Living                      Prior Function            PT Goals (current goals can now be found in the care plan section) Progress towards PT goals: Goals met/education completed, patient discharged from PT    Frequency           PT Plan Discharge plan needs to be updated    Co-evaluation              AM-PAC PT "6 Clicks" Daily Activity  Outcome Measure  Difficulty turning over in bed (including adjusting bedclothes, sheets and blankets)?: A Little  Difficulty moving from lying on back to sitting on the side of the bed? : A Little Difficulty sitting down on and standing up from a chair with arms (e.g., wheelchair, bedside commode, etc,.)?: None Help needed moving to and from a bed to chair (including a wheelchair)?: None Help needed walking in hospital room?: None Help needed climbing 3-5 steps with a railing? : None 6 Click Score: 22    End of Session   Activity Tolerance: Patient tolerated treatment well Patient left: in chair;with call bell/phone within [name] Nurse Communication: Mobility status;Precautions PT Visit Diagnosis: Difficulty in walking, not elsewhere classified (R26.2)     Time: 3533-1740 PT Time Calculation (min) (ACUTE ONLY): 24 min  Charges:  $Gait Training: 8-22 mins $Therapeutic Activity: 8-22 mins                    G Codes:       Anne [name], Anne [name]    Anne [name] 01/13/2017, 9:07 AM

## 2017-01-13 NOTE — Progress Notes (Signed)
Occupational Therapy Treatment Patient Details Name: Anne [name] MRN: [id number] DOB: 1950/07/18 Today's Date: 01/13/2017    History of present illness 67 y.o. female admitted on 01/10/17 for decompressive cervical laminectomy and medial facetectomy C3-C7 with lateral mass fusion C3-7.  PMH of vocal cord paralysis (s/p trach), CVA, recent shingles.    OT comments  Pt progressing very well and has achieved all acute OT goals. Pt able to complete daily tasks such as using utensils, opening containers, dressing, writing, etc... With AE provided and remediation and compensatory strategy training. Pt continues to report pain and numbness in R hand - educated pt on safety and to be very careful when dealing with items that emit extreme temperatures (i.e. washing hands, using oven/stove, etc...) to avoid skin integrity issues. Pt demonstrated theraputty exercises and fine motor activities previously learned and reminded her to continue engaing in daily tasks and leisure activities that she enjoys (i.e. Firefighter, crocheting, baking, crafts) to also work towards improved strength, fine motor coordination and sensation in R hand. Pt with no further acute OT needs. OT signing off.   Follow Up Recommendations  No OT follow up;Supervision - Intermittent    Equipment Recommendations  None recommended by OT    Recommendations for Other Services      Precautions / Restrictions Precautions Precautions: Cervical;Fall Precaution Booklet Issued: No Precaution Comments: Pt able to recall all cervical precautions Required Braces or Orthoses: Cervical Brace Cervical Brace: Hard collar;For comfort Restrictions Weight Bearing Restrictions: No       Mobility Bed Mobility Overal bed mobility: Modified Independent Bed Mobility: Rolling;Sidelying to Sit Rolling: Modified independent (Device/Increase time) Sidelying to sit: Modified independent (Device/Increase time)       General bed mobility  comments: pt able to complete bed mobility without cues or assist  Transfers Overall transfer level: Modified independent Equipment used: None                  Balance Overall balance assessment: No apparent balance deficits (not formally assessed)                                         ADL either performed or assessed with clinical judgement   ADL Overall ADL's : Modified independent                                       General ADL Comments: Pt able to eat and use built-up handled utensils (red therafoam provided previously) with R hand and L hand. Pt reports being able to open containers and complete buttons with increased time provided and some assistance from L hand. Educated pt to continue doing theraputty exercises, fine motor activities (on handout), and daily tasks that she normally completes including leisure activities (i.e. Firefighter, baking, crafts, etc...). Educated pt on sensory change in hand and to be very careful when dealing with items that emit extreme temperatures (i.e. washing hands, using oven/stove, etc...).      Vision   Vision Assessment?: No apparent visual deficits   Perception     Praxis      Cognition Arousal/Alertness: Awake/alert Behavior During Therapy: WFL for tasks assessed/performed Overall Cognitive Status: Within Functional Limits for tasks assessed  Exercises Exercises: Hand exercises;Hand activities (handouts provided)   Shoulder Instructions       General Comments      Pertinent Vitals/ Pain       Pain Assessment: 0-10 Pain Score: 2  Pain Location: R hand Pain Descriptors / Indicators: Tender;Numbness Pain Intervention(s): Monitored during session  Home Living                                          Prior Functioning/Environment              Frequency           Progress Toward Goals  OT  Goals(current goals can now be found in the care plan section)  Progress towards OT goals: Goals met/education completed, patient discharged from OT  Acute Rehab OT Goals Patient Stated Goal: to get back to playing with her 10 grandkids OT Goal Formulation: With patient/family Time For Goal Achievement: 01/18/17 Potential to Achieve Goals: Good ADL Goals Pt Will Perform Grooming: with supervision;with set-up;standing;with adaptive equipment Pt Will Perform Upper Body Bathing: with supervision;with set-up;sitting Pt Will Perform Upper Body Dressing: with set-up;with supervision;with adaptive equipment;sitting Pt Will Perform Lower Body Dressing: with set-up;with supervision;sit to/from stand Pt Will Perform Toileting - Clothing Manipulation and hygiene: with set-up;with supervision;sit to/from stand Pt/caregiver will Perform Home Exercise Program: Increased ROM;Increased strength;Both right and left upper extremity;With written HEP provided;With theraputty;Independently  Plan All goals met and education completed, patient discharged from OT services;Discharge plan needs to be updated    Co-evaluation                 AM-PAC PT "6 Clicks" Daily Activity     Outcome Measure   Help from another person eating meals?: None Help from another person taking care of personal grooming?: None Help from another person toileting, which includes using toliet, bedpan, or urinal?: None Help from another person bathing (including washing, rinsing, drying)?: None Help from another person to put on and taking off regular upper body clothing?: None Help from another person to put on and taking off regular lower body clothing?: None 6 Click Score: 24    End of Session Equipment Utilized During Treatment: Cervical collar  OT Visit Diagnosis: Unsteadiness on feet (R26.81);Pain;Muscle weakness (generalized) (M62.81) Pain - Right/Left: Right Pain - part of body: Hand   Activity Tolerance Patient  tolerated treatment well   Patient Left in chair;with call bell/phone within reach   Nurse Communication Mobility status        Time: 1207-1217 OT Time Calculation (min): 10 min  Charges: OT General Charges $OT Visit: 1 Visit OT Treatments $Self Care/Home Management : 8-22 mins   Redmond Baseman, MS, OTR/L 01/13/2017, 12:32 PM

## 2017-01-13 NOTE — Plan of Care (Signed)
Problem: Activity: Goal: Ability to tolerate increased activity will improve Outcome: Progressing Patient walked in hall and to bathroom with standby assist only, tolerated the walk well.

## 2017-01-17 ENCOUNTER — Other Ambulatory Visit: Payer: Medicare Other

## 2017-01-22 ENCOUNTER — Encounter: Payer: Self-pay | Admitting: Family Medicine

## 2017-01-29 ENCOUNTER — Other Ambulatory Visit: Payer: Self-pay | Admitting: Family Medicine

## 2017-01-29 ENCOUNTER — Encounter: Payer: Self-pay | Admitting: Family Medicine

## 2017-01-31 NOTE — Telephone Encounter (Signed)
Last OV 01/02/17 ok to fill?

## 2017-02-01 NOTE — Telephone Encounter (Signed)
Script faxed to [facility]

## 2017-02-05 ENCOUNTER — Telehealth: Payer: Self-pay | Admitting: Family Medicine

## 2017-02-05 NOTE — Telephone Encounter (Signed)
Yes if steroids were stopped on oct 21  She can have flu vaccine

## 2017-02-05 NOTE — Telephone Encounter (Signed)
Pt called back transferred call to office

## 2017-02-05 NOTE — Telephone Encounter (Signed)
Left message to return call to our office.  

## 2017-02-05 NOTE — Telephone Encounter (Signed)
Patient has been on steroids for back surgery stopped on 01/14/17 will be ok to get flu shot tomorrow?

## 2017-02-05 NOTE — Telephone Encounter (Signed)
Copied from [place] [phone]. Topic: Quick Communication - See Telephone Encounter >> Feb 05, 2017  1:34 PM Cleaster Cor[place], [place] wrote: CRM for notification. See Telephone encounter for:   02/05/17. Pt. Wants to know if she would be able to get to have her flu shot done tomorrow. Had neck surgery and on several meds. Bust isnt sick and just wanted to know if its ok that she has that done while coming in to appt. Tomorrow 02/06/17

## 2017-02-06 ENCOUNTER — Ambulatory Visit (INDEPENDENT_AMBULATORY_CARE_PROVIDER_SITE_OTHER): Payer: Medicare Other

## 2017-02-06 DIAGNOSIS — Z23 Encounter for immunization: Secondary | ICD-10-CM | POA: Diagnosis not present

## 2017-02-06 DIAGNOSIS — E538 Deficiency of other specified B group vitamins: Secondary | ICD-10-CM | POA: Diagnosis not present

## 2017-02-06 MED ORDER — CYANOCOBALAMIN 1000 MCG/ML IJ SOLN
1000.0000 ug | Freq: Once | INTRAMUSCULAR | Status: AC
  Administered 2017-02-06: 1000 ug via INTRAMUSCULAR

## 2017-02-06 NOTE — Telephone Encounter (Signed)
When this patient comes for B 12 injection today she can have flu shot as long as no steroids since 01/14/17.

## 2017-02-06 NOTE — Telephone Encounter (Signed)
Patient was given flu injection

## 2017-02-06 NOTE — Progress Notes (Addendum)
Patient comes in today for a vitamin B12 injection. Administered B12 in right deltoid IM. Patient also requested an egg free influenza injection. Immunization was ordered for patient. Administered in left deltoid IM. Patient tolerated well.  Reviewed.  Dr Nicki Reaper

## 2017-02-07 ENCOUNTER — Encounter: Payer: Self-pay | Admitting: Family Medicine

## 2017-02-08 ENCOUNTER — Encounter (HOSPITAL_COMMUNITY): Payer: Self-pay | Admitting: *Deleted

## 2017-02-08 ENCOUNTER — Inpatient Hospital Stay (HOSPITAL_COMMUNITY)
Admission: EM | Admit: 2017-02-08 | Discharge: 2017-02-10 | DRG: 920 | Disposition: A | Discharge status: Home / Self Care | Payer: Medicare Other | Attending: Neurosurgery | Admitting: Neurosurgery

## 2017-02-08 ENCOUNTER — Other Ambulatory Visit: Payer: Self-pay

## 2017-02-08 ENCOUNTER — Encounter (HOSPITAL_COMMUNITY)
Admission: EM | Disposition: A | Discharge status: Home / Self Care | Payer: Self-pay | Source: Home / Self Care | Attending: Neurosurgery

## 2017-02-08 DIAGNOSIS — Z888 Allergy status to other drugs, medicaments and biological substances status: Secondary | ICD-10-CM

## 2017-02-08 DIAGNOSIS — Z91018 Allergy to other foods: Secondary | ICD-10-CM | POA: Diagnosis not present

## 2017-02-08 DIAGNOSIS — J449 Chronic obstructive pulmonary disease, unspecified: Secondary | ICD-10-CM | POA: Diagnosis present

## 2017-02-08 DIAGNOSIS — Z886 Allergy status to analgesic agent status: Secondary | ICD-10-CM | POA: Diagnosis not present

## 2017-02-08 DIAGNOSIS — Z887 Allergy status to serum and vaccine status: Secondary | ICD-10-CM | POA: Diagnosis not present

## 2017-02-08 DIAGNOSIS — Y838 Other surgical procedures as the cause of abnormal reaction of the patient, or of later complication, without mention of misadventure at the time of the procedure: Secondary | ICD-10-CM | POA: Diagnosis not present

## 2017-02-08 DIAGNOSIS — Z91010 Allergy to peanuts: Secondary | ICD-10-CM | POA: Diagnosis not present

## 2017-02-08 DIAGNOSIS — J8410 Pulmonary fibrosis, unspecified: Secondary | ICD-10-CM | POA: Diagnosis present

## 2017-02-08 DIAGNOSIS — K589 Irritable bowel syndrome without diarrhea: Secondary | ICD-10-CM | POA: Diagnosis present

## 2017-02-08 DIAGNOSIS — Z7902 Long term (current) use of antithrombotics/antiplatelets: Secondary | ICD-10-CM | POA: Diagnosis not present

## 2017-02-08 DIAGNOSIS — T8149XA Infection following a procedure, other surgical site, initial encounter: Secondary | ICD-10-CM | POA: Diagnosis present

## 2017-02-08 DIAGNOSIS — Z79899 Other long term (current) drug therapy: Secondary | ICD-10-CM

## 2017-02-08 DIAGNOSIS — Z8673 Personal history of transient ischemic attack (TIA), and cerebral infarction without residual deficits: Secondary | ICD-10-CM | POA: Diagnosis not present

## 2017-02-08 DIAGNOSIS — Z91030 Bee allergy status: Secondary | ICD-10-CM | POA: Diagnosis not present

## 2017-02-08 DIAGNOSIS — Z882 Allergy status to sulfonamides status: Secondary | ICD-10-CM | POA: Diagnosis not present

## 2017-02-08 DIAGNOSIS — T8131XA Disruption of external operation (surgical) wound, not elsewhere classified, initial encounter: Principal | ICD-10-CM | POA: Diagnosis present

## 2017-02-08 DIAGNOSIS — Z91012 Allergy to eggs: Secondary | ICD-10-CM | POA: Diagnosis not present

## 2017-02-08 DIAGNOSIS — Z881 Allergy status to other antibiotic agents status: Secondary | ICD-10-CM

## 2017-02-08 DIAGNOSIS — Z91013 Allergy to seafood: Secondary | ICD-10-CM

## 2017-02-08 DIAGNOSIS — X58XXXA Exposure to other specified factors, initial encounter: Secondary | ICD-10-CM | POA: Diagnosis present

## 2017-02-08 DIAGNOSIS — T8130XA Disruption of wound, unspecified, initial encounter: Secondary | ICD-10-CM

## 2017-02-08 LAB — BASIC METABOLIC PANEL
Anion gap: 6 (ref 5–15)
BUN: 12 mg/dL (ref 6–20)
CO2: 28 mmol/L (ref 22–32)
Calcium: 9.1 mg/dL (ref 8.9–10.3)
Chloride: 105 mmol/L (ref 101–111)
Creatinine, Ser: 1.06 mg/dL — ABNORMAL HIGH (ref 0.44–1.00)
GFR calc Af Amer: >60 mL/min (ref >60)
GFR calc non Af Amer: 53 mL/min — ABNORMAL LOW (ref >60)
Glucose, Bld: 92 mg/dL (ref 65–99)
Potassium: 3.3 mmol/L — ABNORMAL LOW (ref 3.5–5.1)
Sodium: 139 mmol/L (ref 135–145)

## 2017-02-08 LAB — CBC WITH DIFFERENTIAL/PLATELET
Basophils Absolute: 0.1 10*3/uL (ref 0.0–0.1)
Basophils Relative: 1 %
Eosinophils Absolute: 1.2 10*3/uL — ABNORMAL HIGH (ref 0.0–0.7)
Eosinophils Relative: 22 %
HCT: 34.1 % — ABNORMAL LOW (ref 36.0–46.0)
Hemoglobin: 11.1 g/dL — ABNORMAL LOW (ref 12.0–15.0)
Lymphocytes Relative: 30 %
Lymphs Abs: 1.6 10*3/uL (ref 0.7–4.0)
MCH: 30.8 pg (ref 26.0–34.0)
MCHC: 32.6 g/dL (ref 30.0–36.0)
MCV: 94.7 fL (ref 78.0–100.0)
Monocytes Absolute: 0.4 10*3/uL (ref 0.1–1.0)
Monocytes Relative: 7 %
Neutro Abs: 2.1 10*3/uL (ref 1.7–7.7)
Neutrophils Relative %: 40 %
Platelets: 270 10*3/uL (ref 150–400)
RBC: 3.6 MIL/uL — ABNORMAL LOW (ref 3.87–5.11)
RDW: 13.3 % (ref 11.5–15.5)
WBC: 5.3 10*3/uL (ref 4.0–10.5)

## 2017-02-08 LAB — SEDIMENTATION RATE: Sed Rate: 30 mm/hr — ABNORMAL HIGH (ref 0–22)

## 2017-02-08 LAB — C-REACTIVE PROTEIN: CRP: 1.8 mg/dL — ABNORMAL HIGH (ref <1.0)

## 2017-02-08 SURGERY — WOUND EXPLORATION
Anesthesia: Choice

## 2017-02-08 MED ORDER — FLUTICASONE PROPIONATE 50 MCG/ACT NA SUSP
2.0000 | Freq: Every day | NASAL | Status: DC | PRN
  Administered 2017-02-10: 2 via NASAL
  Filled 2017-02-08: qty 16

## 2017-02-08 MED ORDER — LIDOCAINE HCL (PF) 1 % IJ SOLN
INTRAMUSCULAR | Status: AC
  Filled 2017-02-08: qty 5

## 2017-02-08 MED ORDER — DOCUSATE SODIUM 100 MG PO CAPS
100.0000 mg | ORAL_CAPSULE | Freq: Two times a day (BID) | ORAL | Status: DC
  Administered 2017-02-08 – 2017-02-09 (×2): 100 mg via ORAL
  Filled 2017-02-08 (×3): qty 1

## 2017-02-08 MED ORDER — IPRATROPIUM-ALBUTEROL 0.5-2.5 (3) MG/3ML IN SOLN: 3.0000 mL | Freq: Four times a day (QID) | RESPIRATORY_TRACT | Status: DC | PRN

## 2017-02-08 MED ORDER — SALINE SPRAY 0.65 % NA SOLN: 1.0000 | NASAL | Status: DC | PRN

## 2017-02-08 MED ORDER — MONTELUKAST SODIUM 10 MG PO TABS
10.0000 mg | ORAL_TABLET | Freq: Every day | ORAL | Status: DC
  Administered 2017-02-08 – 2017-02-09 (×2): 10 mg via ORAL
  Filled 2017-02-08 (×2): qty 1

## 2017-02-08 MED ORDER — MUPIROCIN 2 % EX OINT
1.0000 "application " | TOPICAL_OINTMENT | Freq: Every day | CUTANEOUS | Status: DC
  Administered 2017-02-08: 1 via TOPICAL
  Filled 2017-02-08: qty 22

## 2017-02-08 MED ORDER — ZOLMITRIPTAN 5 MG PO TBDP: 5.0000 mg | ORAL_TABLET | Freq: Every day | ORAL | Status: DC | PRN

## 2017-02-08 MED ORDER — VANCOMYCIN HCL IN DEXTROSE 1-5 GM/200ML-% IV SOLN
1000.0000 mg | Freq: Once | INTRAVENOUS | Status: AC
  Administered 2017-02-09: 1000 mg via INTRAVENOUS
  Filled 2017-02-08: qty 200

## 2017-02-08 MED ORDER — ALPRAZOLAM 0.25 MG PO TABS
0.2500 mg | ORAL_TABLET | Freq: Two times a day (BID) | ORAL | Status: DC | PRN
  Administered 2017-02-08 – 2017-02-09 (×2): 0.125 mg via ORAL
  Administered 2017-02-09: 0.25 mg via ORAL
  Filled 2017-02-08 (×3): qty 1

## 2017-02-08 MED ORDER — CLOPIDOGREL BISULFATE 75 MG PO TABS
75.0000 mg | ORAL_TABLET | Freq: Every morning | ORAL | Status: DC
  Administered 2017-02-09 – 2017-02-10 (×2): 75 mg via ORAL
  Filled 2017-02-08 (×2): qty 1

## 2017-02-08 MED ORDER — LORATADINE 10 MG PO TABS
10.0000 mg | ORAL_TABLET | Freq: Every day | ORAL | Status: DC
  Administered 2017-02-08 – 2017-02-09 (×2): 10 mg via ORAL
  Filled 2017-02-08 (×2): qty 1

## 2017-02-08 MED ORDER — OXYCODONE HCL 5 MG PO TABS: 5.0000 mg | ORAL_TABLET | Freq: Four times a day (QID) | ORAL | Status: DC | PRN

## 2017-02-08 MED ORDER — LINACLOTIDE 72 MCG PO CAPS
72.0000 ug | ORAL_CAPSULE | Freq: Every day | ORAL | Status: DC
  Filled 2017-02-08 (×2): qty 1

## 2017-02-08 MED ORDER — METHOCARBAMOL 500 MG PO TABS: 500.0000 mg | ORAL_TABLET | Freq: Four times a day (QID) | ORAL | Status: DC | PRN

## 2017-02-08 MED ORDER — FUROSEMIDE 20 MG PO TABS
20.0000 mg | ORAL_TABLET | Freq: Every day | ORAL | Status: DC
  Administered 2017-02-09 – 2017-02-10 (×2): 20 mg via ORAL
  Filled 2017-02-08 (×2): qty 1

## 2017-02-08 MED ORDER — POTASSIUM CHLORIDE IN NACL 20-0.9 MEQ/L-% IV SOLN
INTRAVENOUS | Status: DC
  Administered 2017-02-08: 23:00:00 via INTRAVENOUS
  Filled 2017-02-08: qty 1000

## 2017-02-08 MED ORDER — HYPROMELLOSE (GONIOSCOPIC) 2.5 % OP SOLN
1.0000 [drp] | Freq: Three times a day (TID) | OPHTHALMIC | Status: DC | PRN
  Filled 2017-02-08: qty 15

## 2017-02-08 MED ORDER — ESTRADIOL 1 MG PO TABS
1.0000 mg | ORAL_TABLET | Freq: Every day | ORAL | Status: DC
  Administered 2017-02-09: 1 mg via ORAL
  Filled 2017-02-08 (×2): qty 1

## 2017-02-08 MED ORDER — SUMATRIPTAN SUCCINATE 25 MG PO TABS: 25.0000 mg | ORAL_TABLET | Freq: Every day | ORAL | Status: DC | PRN

## 2017-02-08 MED ORDER — ACETAMINOPHEN 325 MG PO TABS: 650.0000 mg | ORAL_TABLET | Freq: Four times a day (QID) | ORAL | Status: DC | PRN

## 2017-02-08 MED ORDER — OXYCODONE HCL 5 MG PO TABS: 5.0000 mg | ORAL_TABLET | ORAL | Status: DC | PRN

## 2017-02-08 MED ORDER — ALBUTEROL SULFATE HFA 108 (90 BASE) MCG/ACT IN AERS: 2.0000 | INHALATION_SPRAY | Freq: Four times a day (QID) | RESPIRATORY_TRACT | Status: DC | PRN

## 2017-02-08 MED ORDER — ONDANSETRON HCL 4 MG/2ML IJ SOLN: 4.0000 mg | Freq: Four times a day (QID) | INTRAMUSCULAR | Status: DC | PRN

## 2017-02-08 MED ORDER — PANTOPRAZOLE SODIUM 40 MG PO TBEC
40.0000 mg | DELAYED_RELEASE_TABLET | Freq: Every day | ORAL | Status: DC
  Administered 2017-02-09 – 2017-02-10 (×2): 40 mg via ORAL
  Filled 2017-02-08 (×2): qty 1

## 2017-02-08 MED ORDER — ONDANSETRON HCL 4 MG PO TABS: 4.0000 mg | ORAL_TABLET | Freq: Four times a day (QID) | ORAL | Status: DC | PRN

## 2017-02-08 MED ORDER — DEXTROSE 5 % IV SOLN
1.0000 g | INTRAVENOUS | Status: DC
  Administered 2017-02-08 – 2017-02-09 (×2): 1 g via INTRAVENOUS
  Filled 2017-02-08 (×3): qty 10

## 2017-02-08 MED ORDER — ACETAMINOPHEN 650 MG RE SUPP: 650.0000 mg | Freq: Four times a day (QID) | RECTAL | Status: DC | PRN

## 2017-02-08 MED ORDER — LIDOCAINE-EPINEPHRINE (PF) 2 %-1:200000 IJ SOLN: 20.0000 mL | Freq: Once | INTRAMUSCULAR | Status: DC

## 2017-02-08 MED ORDER — DEXAMETHASONE 4 MG PO TABS
4.0000 mg | ORAL_TABLET | Freq: Two times a day (BID) | ORAL | Status: DC
  Administered 2017-02-10: 4 mg via ORAL
  Filled 2017-02-08 (×2): qty 1

## 2017-02-08 MED ORDER — GABAPENTIN 300 MG PO CAPS
300.0000 mg | ORAL_CAPSULE | Freq: Three times a day (TID) | ORAL | Status: DC
  Administered 2017-02-08 – 2017-02-10 (×5): 300 mg via ORAL
  Filled 2017-02-08 (×5): qty 1

## 2017-02-08 MED ORDER — POTASSIUM CHLORIDE 20 MEQ/15ML (10%) PO SOLN: 20.0000 meq | Freq: Every day | ORAL | Status: DC | PRN

## 2017-02-08 MED ORDER — VANCOMYCIN HCL IN DEXTROSE 750-5 MG/150ML-% IV SOLN
750.0000 mg | INTRAVENOUS | Status: DC
  Filled 2017-02-08: qty 150

## 2017-02-08 MED ORDER — IBUPROFEN 800 MG PO TABS
800.0000 mg | ORAL_TABLET | Freq: Two times a day (BID) | ORAL | Status: DC | PRN
  Filled 2017-02-08: qty 1

## 2017-02-08 MED ORDER — LIDOCAINE HCL (PF) 1 % IJ SOLN
INTRAMUSCULAR | Status: AC
  Administered 2017-02-08: 5 mL
  Filled 2017-02-08: qty 5

## 2017-02-08 NOTE — ED Notes (Signed)
Attempted to call report

## 2017-02-08 NOTE — ED Provider Notes (Signed)
[facility] EMERGENCY DEPARTMENT Provider Note   CSN: [id number] Arrival date & time: 02/08/17  1637     History   Chief Complaint Chief Complaint  Patient presents with  . Post-op Problem    HPI Anne [name] is a 66 y.o. female.  HPI   66 year old female presenting for evaluation of a postop complication.  Patient had a laminectomy and foraminotomy of her cervical spine on 01/10/2017 performed by Dr. Ronnald Ramp.  Patient has been doing well since the surgery today while showering, she felt a pop and some pain to the back of her neck follows with some blood that drains down her neck.  She called her husband who noticed that the surgical site has popped open and he can visualize the inner structure of the wound.  Aside from some mild burning sensation to the surrounding skin patient denies any fever, worsening arm weakness, chest pain or trouble breathing.  They applied bandaged and came to the ER for further evaluation as recommended by the neurosurgical staff when they called earlier.    Past Medical History:  Diagnosis Date  . Allergic rhinitis   . Anemia   . Asthma   . Complication of anesthesia    Breathing problems. 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  . Compressed cervical disc   . COPD (chronic obstructive pulmonary disease) ([place])   . CVA (cerebral infarction)   . Dyspnea   . Esophageal dysmotility   . Heart murmur    as child  . History of hiatal hernia   . IBS (irritable bowel syndrome)   . Migraine   . Problems with swallowing    intermittently  . Pulmonary fibrosis ([place])   . Shingles   . Stroke [facility])    slurred speech, drawn face, imaging normal, occurred twice, [facility]  . Tracheostomy in place [facility])   . Vocal cord paresis     Patient Active Problem List   Diagnosis Date Noted  . S/P cervical spinal fusion 01/10/2017  . Kidney lesion 12/13/2016  . Respiratory infection 11/13/2016  . Asthma 09/12/2016  . Migraine 09/12/2016  . Esophageal  dysphagia 06/19/2016  . Skin nodule 01/10/2016  . Hypokalemia 01/10/2016  . Dysuria 12/15/2015  . Degenerative arthritis of cervical spine 10/28/2015  . Gastritis   . B12 deficiency 03/15/2015  . Shingles 11/20/2014  . Abdominal bloating 09/03/2014  . Slow transit constipation 09/03/2014  . Menopausal symptom 09/03/2014  . Vocal cord dysfunction 07/15/2014  . History of TIA (transient ischemic attack) 06/03/2014  . Chronic pain syndrome 04/28/2014  . Fatigue 09/30/2013  . Hypertensive lower esophageal sphincter 07/29/2013  . Achalasia of esophagus 07/29/2013  . Edema 07/01/2013  . Chronic pulmonary aspiration 06/06/2013  . Insomnia 12/13/2012  . Other and unspecified hyperlipidemia 12/13/2012  . Anemia 08/13/2012  . 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 ABDOMINAL HYSTERECTOMY    . BOTOX INJECTION N/A 07/29/2013   Procedure: BOTOX INJECTION;  Surgeon: Jerene Bears, MD;  Location: [facility];  Service: Gastroenterology;  Laterality: N/A;  . BOTOX INJECTION N/A 05/04/2015   Procedure: BOTOX INJECTION;  Surgeon: Jerene Bears, MD;  [facility];  Service: Gastroenterology;  Laterality: N/A;  . BREAST SURGERY  2002   bx of skin  . CHOLECYSTECTOMY    . COLONOSCOPY    . ESOPHAGEAL MANOMETRY N/A 12/16/2012   Procedure: ESOPHAGEAL MANOMETRY (EM);  Surgeon: Lajuan Lines  Pyrtle, MD;  [facility];  Service: Gastroenterology;  Laterality: N/A;  . ESOPHAGOGASTRODUODENOSCOPY (EGD) WITH PROPOFOL N/A 07/29/2013   Procedure: ESOPHAGOGASTRODUODENOSCOPY (EGD) WITH PROPOFOL;  Surgeon: Jerene Bears, MD;  [facility];  Service: Gastroenterology;  Laterality: N/A;  with botox injection  . ESOPHAGOGASTRODUODENOSCOPY (EGD) WITH PROPOFOL N/A 05/04/2015   Procedure: ESOPHAGOGASTRODUODENOSCOPY (EGD) WITH PROPOFOL;  Surgeon: Jerene Bears, MD;  [facility];   Service: Gastroenterology;  Laterality: N/A;  . EYE SURGERY     Catarct surgery 2014  . Eye Surgery AS Child    . JEJUNOSTOMY FEEDING TUBE     x2 both failed. no longer has  . MULTIPLE TOOTH EXTRACTIONS     2 teeth removed  . POSTERIOR CERVICAL FUSION/FORAMINOTOMY N/A 01/10/2017   Procedure: LAMINECTOMY AND FORAMINOTOMY CERVICAL FOUR-CERVICAL FIVE, CERVICAL FIVE-SIX POSTERIOR CERVICAL INSTRUMENT FUSION CERVICAL THREE-CERVICAL SEVEN,CERVICAL LAMINECTOMY CERVICAL THREE-CERVICAL SEVEN.;  Surgeon: Eustace Moore, MD;  Location: [facility];  Service: Neurosurgery;  Laterality: N/A;  posterior  . TRACHEOSTOMY  1996   done at [facility], [place], Dr. Kathyrn Sheriff  . TUBAL LIGATION    . VIDEO BRONCHOSCOPY Bilateral 11/20/2012   Procedure: VIDEO BRONCHOSCOPY WITH FLUORO;  Surgeon: Juanito Doom, MD;  [facility];  Service: Cardiopulmonary;  Laterality: Bilateral;    OB History    No data available       Home Medications    Prior to Admission medications   Medication Sig Start Date End Date Taking? Authorizing Provider  acetaminophen (TYLENOL) 500 MG tablet Take 500 mg by mouth daily as needed for moderate pain or headache.    [provider]  albuterol (PROVENTIL HFA;VENTOLIN HFA) 108 (90 Base) MCG/ACT inhaler Inhale 2 puffs into the lungs every 6 (six) hours as needed for wheezing or shortness of breath. 09/12/16   [name], MD  ALPRAZolam Duanne Moron) 0.25 MG tablet TAKE 1/2 TABLET BY MOUTH TWICE DAILY AS NEEDED 01/31/17   [name], MD  cetirizine (ZYRTEC) 10 MG tablet TAKE ONE TABLET BY MOUTH EVERY DAY 11/30/16   Coral Spikes, DO  clopidogrel (PLAVIX) 75 MG tablet TAKE ONE TABLET BY MOUTH EVERY MORNING Patient taking differently: TAKE 75 MG BY MOUTH EVERY MORNING 11/21/16   [name], MD  dexamethasone (DECADRON) 4 MG tablet Take 1 tablet (4 mg total) by mouth 2 (two) times daily with a meal. 01/13/17   Ashok Pall, MD  EPINEPHrine (EPIPEN 2-PAK) 0.3 mg/0.3 mL IJ SOAJ  injection Inject 0.3 mLs (0.3 mg total) into the muscle once. Patient taking differently: Inject 0.3 mg into the muscle as needed (allergic reaction).  02/10/15   Jackolyn Confer, MD  estradiol (ESTRACE) 1 MG tablet TAKE ONE TABLET EVERY DAY 08/17/16   [name], MD  fluticasone (FLONASE) 50 MCG/ACT nasal spray PLACE 2 SPRAYS INTO BOTH NOSTRILS DAILY. Patient taking differently: Place 2 sprays into both nostrils daily as needed for allergies.  12/22/15   Mungal, Roxanne Mins, MD  furosemide (LASIX) 20 MG tablet Take 20 mg by mouth daily.    [provider]  gabapentin (NEURONTIN) 300 MG capsule Take 1 capsule (300 mg total) by mouth 3 (three) times daily. 01/13/17   Ashok Pall, MD  hydroxypropyl methylcellulose (ISOPTO TEARS) 2.5 % ophthalmic solution Place 1 drop into both eyes 3 (three) times daily as needed for dry eyes.    [provider]  ibuprofen (ADVIL,MOTRIN) 200 MG tablet Take 800 mg by mouth 2 (two) times daily as needed for  mild pain or moderate pain.     [provider]  ipratropium-albuterol (DUONEB) 0.5-2.5 (3) MG/3ML SOLN Take 3 mLs by nebulization every 4 (four) hours as needed. Dx 496 06/06/13   Juanito Doom, MD  linaclotide (LINZESS) 72 MCG capsule Take 1 capsule (72 mcg total) by mouth daily before breakfast. Patient taking differently: Take 72 mcg by mouth daily as needed.  06/01/16   Pyrtle, Lajuan Lines, MD  Liniments [facility] PAIN RELIEF PATCH EX) Apply 1-2 patches topically daily as needed (shoulder pain).    [provider]  methocarbamol (ROBAXIN) 500 MG tablet Take 1 tablet (500 mg total) by mouth every 6 (six) hours as needed for muscle spasms. 01/13/17   Ashok Pall, MD  montelukast (SINGULAIR) 10 MG tablet Take 10 mg by mouth at bedtime.     [provider]  mupirocin ointment (BACTROBAN) 2 % Apply 1 application topically daily. To keep trach site from getting irritated    [provider]  oxyCODONE (OXY  IR/ROXICODONE) 5 MG immediate release tablet Take 1 tablet (5 mg total) by mouth every 6 (six) hours as needed for breakthrough pain. 01/13/17   Ashok Pall, MD  pantoprazole (PROTONIX) 40 MG tablet Take 1 tablet (40 mg total) by mouth daily. 08/14/16   Pyrtle, Lajuan Lines, MD  potassium chloride 20 MEQ/15ML (10%) SOLN Take 15 mLs (20 mEq total) by mouth daily as needed (cramps). 05/12/16   [name], MD  Probiotic Product (PROBIOTIC PO) Take 1 tablet by mouth daily as needed (gi distress).     [provider]  rosuvastatin (CRESTOR) 5 MG tablet Take 1 tablet (5 mg total) by mouth daily. Patient not taking: Reported on 01/02/2017 12/14/16   [name], MD  sodium chloride (OCEAN) 0.65 % SOLN nasal spray Place 1 spray into both nostrils every 4 (four) hours as needed for congestion.     [provider]  zolmitriptan (ZOMIG-ZMT) 5 MG disintegrating tablet Take 1 tablet (5 mg total) by mouth daily as needed for migraine. 09/12/16   [name], MD    Family History Family History  Problem Relation Age of Onset  . Asthma Cousin   . COPD Cousin   . Breast cancer Maternal Grandmother 60  . Cancer Maternal Grandmother        breast  . Asthma Father   . Cancer Father        lung  . Kidney cancer Father   . Cancer Paternal Uncle        lung  . COPD Paternal Grandfather   . Breast cancer Maternal Aunt 24  . Cancer Maternal Aunt        breast  . Bladder Cancer Neg Hx     Social History Social History   Tobacco Use  . Smoking status: Never Smoker  . Smokeless tobacco: Never Used  Substance Use Topics  . Alcohol use: No    Alcohol/week: 0.0 oz  . Drug use: No     Allergies   Influenza vaccines; Shellfish allergy; Quinolones; Asa [aspirin]; Ciprofloxacin; Levaquin [levofloxacin in d5w]; Peanut-containing drug products; Septra [sulfamethoxazole-trimethoprim]; Adhesive [tape]; and Eggs or egg-derived products   Review of Systems Review of Systems    Constitutional: Negative for fever.  Musculoskeletal: Positive for neck pain.  Skin: Positive for wound.  Neurological: Negative for numbness.  All other systems reviewed and are negative.    Physical Exam Updated Vital Signs BP 124/68 (BP Location: Right Arm)   Pulse  85   Temp 98.5 F (36.9 C) (Oral)   Resp 20   SpO2 100%   Physical Exam  Constitutional: She is oriented to person, place, and time. She appears well-developed and well-nourished. No distress.  HENT:  Head: Atraumatic.  Eyes: Conjunctivae are normal.  Neck: Neck supple.  In the posterior neck there is a 2 cm dehiscence wound exposing internal structure but no surrounding skin erythema or significant pain.  Tracheostomy tube in place with Passy-Muir valve  Cardiovascular: Normal rate and regular rhythm.  Pulmonary/Chest: Effort normal and breath sounds normal.  Musculoskeletal:  Normal grip strength and equal strength to bilateral upper extremities  Neurological: She is alert and oriented to person, place, and time.  Skin: No rash noted.  Psychiatric: She has a normal mood and affect.  Nursing note and vitals reviewed.        ED Treatments / Results  Labs (all labs ordered are listed, but only abnormal results are displayed) Labs Reviewed  CBC WITH DIFFERENTIAL/PLATELET  BASIC METABOLIC PANEL  SEDIMENTATION RATE  C-REACTIVE PROTEIN    EKG  EKG Interpretation None       Radiology No results found.  Procedures Procedures (including critical care time)  Medications Ordered in ED Medications - No data to display   Initial Impression / Assessment and Plan / ED Course  I have reviewed the triage vital signs and the nursing notes.  Pertinent labs & imaging results that were available during my care of the patient were reviewed by me and considered in my medical decision making (see chart for details).     BP (!) 99/53   Pulse 65   Temp 98.5 F (36.9 C) (Oral)   Resp 20   SpO2  100%    Final Clinical Impressions(s) / ED Diagnoses   Final diagnoses:  Wound dehiscence    ED Discharge Orders    None     7:13 PM Patient here for evaluation of a recent wound dehiscence of the posterior cervical surgical site.  No other complaint.  Pt has cervical hardware.  Plan to consult neurosurgery to evaluate the wound and determine further management.  Care discussed with Dr. Billy Fischer.    7:41 PM Appreciate consultation from neurosurgeon Dr. Arnoldo Morale who agrees to see pt in the ER and will likely take pt to the OR for wash out.  He request CBC/BMP/Sed Rate/CRP and NPO.  Labs ordered.  Pt made aware of plan.  Pt last ate approximately 2 hrs ago.    7:51 PM Dr. Arnoldo Morale plan to perform surgical repair of the wound dehiscence at bedside due to unavailability of OR room for tomorrow.  Will obtain lidocaine and suture cart available at bedside.     Domenic Moras, PA-C 02/08/17 2008    Gareth Morgan, MD 02/11/17 1759

## 2017-02-08 NOTE — H&P (Signed)
Subjective: The patient is a 66 year old white female who had a cervical laminectomy by Dr. Ronnald Ramp on 01/10/2017 for a cervical radiculopathy and stenosis.  After surgery she had quite a bit of right arm pain and numbness.  She was worked up with a cervical MRI which ruled out a cervical epidural hematoma.  The patient was subsequently discharged.  The patient did well until today.  At about 1400 the patient took a shower and noted some drainage from her wound.  She called her husband and they came to the ER.  A wound dehiscence was noted.  A neurosurgical consultation was requested.  Presently the patient is alert and pleasant.  She still has some numbness in her right arm and hand.  No fevers etc.  Past Medical History:  Diagnosis Date  . Allergic rhinitis   . Anemia   . Asthma   . Complication of anesthesia    Breathing problems. 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  . Compressed cervical disc   . COPD (chronic obstructive pulmonary disease) ([place])   . CVA (cerebral infarction)   . Dyspnea   . Esophageal dysmotility   . Heart murmur    as child  . History of hiatal hernia   . IBS (irritable bowel syndrome)   . Migraine   . Problems with swallowing    intermittently  . Pulmonary fibrosis ([place])   . Shingles   . Stroke [facility])    slurred speech, drawn face, imaging normal, occurred twice, [facility]  . Tracheostomy in place [facility])   . Vocal cord paresis     Past Surgical History:  Procedure Laterality Date  . ABDOMINAL HYSTERECTOMY    . BOTOX INJECTION N/A 07/29/2013   Procedure: BOTOX INJECTION;  Surgeon: Jerene Bears, MD;  [facility];  Service: Gastroenterology;  Laterality: N/A;  . BOTOX INJECTION N/A 05/04/2015   Procedure: BOTOX INJECTION;  Surgeon: Jerene Bears, MD;  [facility];  Service: Gastroenterology;  Laterality: N/A;  . BREAST SURGERY  2002   bx of skin  . CHOLECYSTECTOMY    . COLONOSCOPY    . ESOPHAGEAL MANOMETRY N/A 12/16/2012   Procedure: ESOPHAGEAL  MANOMETRY (EM);  Surgeon: Jerene Bears, MD;  [facility];  Service: Gastroenterology;  Laterality: N/A;  . ESOPHAGOGASTRODUODENOSCOPY (EGD) WITH PROPOFOL N/A 07/29/2013   Procedure: ESOPHAGOGASTRODUODENOSCOPY (EGD) WITH PROPOFOL;  Surgeon: Jerene Bears, MD;  [facility];  Service: Gastroenterology;  Laterality: N/A;  with botox injection  . ESOPHAGOGASTRODUODENOSCOPY (EGD) WITH PROPOFOL N/A 05/04/2015   Procedure: ESOPHAGOGASTRODUODENOSCOPY (EGD) WITH PROPOFOL;  Surgeon: Jerene Bears, MD;  [facility];  Service: Gastroenterology;  Laterality: N/A;  . EYE SURGERY     Catarct surgery 2014  . Eye Surgery AS Child    . JEJUNOSTOMY FEEDING TUBE     x2 both failed. no longer has  . MULTIPLE TOOTH EXTRACTIONS     2 teeth removed  . POSTERIOR CERVICAL FUSION/FORAMINOTOMY N/A 01/10/2017   Procedure: LAMINECTOMY AND FORAMINOTOMY CERVICAL FOUR-CERVICAL FIVE, CERVICAL FIVE-SIX POSTERIOR CERVICAL INSTRUMENT FUSION CERVICAL THREE-CERVICAL SEVEN,CERVICAL LAMINECTOMY CERVICAL THREE-CERVICAL SEVEN.;  Surgeon: Eustace Moore, MD;  Location: [place];  Service: Neurosurgery;  Laterality: N/A;  posterior  . TRACHEOSTOMY  1996   done at [facility], Dr. Kathyrn Sheriff  . TUBAL LIGATION    . VIDEO BRONCHOSCOPY Bilateral 11/20/2012   Procedure: VIDEO BRONCHOSCOPY WITH FLUORO;  Surgeon: Juanito Doom, MD;  [facility];  Service: Cardiopulmonary;  Laterality: Bilateral;    Allergies  Allergen Reactions  . Influenza Vaccines Shortness Of Breath and Other (See Comments)    Also causes her to have achy, flu-like symptoms  . Shellfish Allergy Anaphylaxis  . Quinolones Other (See Comments)    Feet swell  . Asa [Aspirin] Swelling and Other (See Comments)    Tongue swelling  . Ciprofloxacin Swelling and Other (See Comments)    ALL OF THE "FLOXACIN"  . Levaquin [Levofloxacin In D5w] Other (See Comments)    Aches and swelling  . Peanut-Containing Drug Products Other (See Comments)     Wheezing   . Septra [Sulfamethoxazole-Trimethoprim] Diarrhea  . Adhesive [Tape] Rash and Other (See Comments)    Paper works fine   . Eggs Or Egg-Derived Products Other (See Comments)    GI Upset    Social History   Tobacco Use  . Smoking status: Never Smoker  . Smokeless tobacco: Never Used  Substance Use Topics  . Alcohol use: No    Alcohol/week: 0.0 oz    Family History  Problem Relation Age of Onset  . Asthma Cousin   . COPD Cousin   . Breast cancer Maternal Grandmother 60  . Cancer Maternal Grandmother        breast  . Asthma Father   . Cancer Father        lung  . Kidney cancer Father   . Cancer Paternal Uncle        lung  . COPD Paternal Grandfather   . Breast cancer Maternal Aunt 61  . Cancer Maternal Aunt        breast  . Bladder Cancer Neg Hx    Prior to Admission medications   Medication Sig Start Date End Date Taking? Authorizing Provider  acetaminophen (TYLENOL) 500 MG tablet Take 500 mg by mouth daily as needed for moderate pain or headache.    [provider]  albuterol (PROVENTIL HFA;VENTOLIN HFA) 108 (90 Base) MCG/ACT inhaler Inhale 2 puffs into the lungs every 6 (six) hours as needed for wheezing or shortness of breath. 09/12/16   [name], MD  ALPRAZolam Duanne Moron) 0.25 MG tablet TAKE 1/2 TABLET BY MOUTH TWICE DAILY AS NEEDED 01/31/17   [name], MD  cetirizine (ZYRTEC) 10 MG tablet TAKE ONE TABLET BY MOUTH EVERY DAY 11/30/16   Coral Spikes, DO  clopidogrel (PLAVIX) 75 MG tablet TAKE ONE TABLET BY MOUTH EVERY MORNING Patient taking differently: TAKE 75 MG BY MOUTH EVERY MORNING 11/21/16   [name], MD  dexamethasone (DECADRON) 4 MG tablet Take 1 tablet (4 mg total) by mouth 2 (two) times daily with a meal. 01/13/17   Ashok Pall, MD  EPINEPHrine (EPIPEN 2-PAK) 0.3 mg/0.3 mL IJ SOAJ injection Inject 0.3 mLs (0.3 mg total) into the muscle once. Patient taking differently: Inject 0.3 mg into the muscle as needed (allergic  reaction).  02/10/15   Jackolyn Confer, MD  estradiol (ESTRACE) 1 MG tablet TAKE ONE TABLET EVERY DAY 08/17/16   [name], MD  fluticasone (FLONASE) 50 MCG/ACT nasal spray PLACE 2 SPRAYS INTO BOTH NOSTRILS DAILY. Patient taking differently: Place 2 sprays into both nostrils daily as needed for allergies.  12/22/15   Mungal, Roxanne Mins, MD  furosemide (LASIX) 20 MG tablet Take 20 mg by mouth daily.    [provider]  gabapentin (NEURONTIN) 300 MG capsule Take 1 capsule (300 mg total) by mouth 3 (three) times daily. 01/13/17   Ashok Pall, MD  hydroxypropyl methylcellulose (ISOPTO TEARS) 2.5 % ophthalmic  solution Place 1 drop into both eyes 3 (three) times daily as needed for dry eyes.    [provider]  ibuprofen (ADVIL,MOTRIN) 200 MG tablet Take 800 mg by mouth 2 (two) times daily as needed for mild pain or moderate pain.     [provider]  ipratropium-albuterol (DUONEB) 0.5-2.5 (3) MG/3ML SOLN Take 3 mLs by nebulization every 4 (four) hours as needed. Dx 496 06/06/13   Juanito Doom, MD  linaclotide (LINZESS) 72 MCG capsule Take 1 capsule (72 mcg total) by mouth daily before breakfast. Patient taking differently: Take 72 mcg by mouth daily as needed.  06/01/16   Pyrtle, Lajuan Lines, MD  Liniments [facility] PAIN RELIEF PATCH EX) Apply 1-2 patches topically daily as needed (shoulder pain).    [provider]  methocarbamol (ROBAXIN) 500 MG tablet Take 1 tablet (500 mg total) by mouth every 6 (six) hours as needed for muscle spasms. 01/13/17   Ashok Pall, MD  montelukast (SINGULAIR) 10 MG tablet Take 10 mg by mouth at bedtime.     [provider]  mupirocin ointment (BACTROBAN) 2 % Apply 1 application topically daily. To keep trach site from getting irritated    [provider]  oxyCODONE (OXY IR/ROXICODONE) 5 MG immediate release tablet Take 1 tablet (5 mg total) by mouth every 6 (six) hours as needed for breakthrough pain. 01/13/17    Ashok Pall, MD  pantoprazole (PROTONIX) 40 MG tablet Take 1 tablet (40 mg total) by mouth daily. 08/14/16   Pyrtle, Lajuan Lines, MD  potassium chloride 20 MEQ/15ML (10%) SOLN Take 15 mLs (20 mEq total) by mouth daily as needed (cramps). 05/12/16   [name], MD  Probiotic Product (PROBIOTIC PO) Take 1 tablet by mouth daily as needed (gi distress).     [provider]  rosuvastatin (CRESTOR) 5 MG tablet Take 1 tablet (5 mg total) by mouth daily. Patient not taking: Reported on 01/02/2017 12/14/16   [name], MD  sodium chloride (OCEAN) 0.65 % SOLN nasal spray Place 1 spray into both nostrils every 4 (four) hours as needed for congestion.     [provider]  zolmitriptan (ZOMIG-ZMT) 5 MG disintegrating tablet Take 1 tablet (5 mg total) by mouth daily as needed for migraine. 09/12/16   [name], MD     Review of Systems  Positive ROS: As above  All other systems have been reviewed and were otherwise negative with the exception of those mentioned in the HPI and as above.  Objective: Vital signs in last 24 hours: Temp:  [98.5 F (36.9 C)] 98.5 F (36.9 C) (11/15 1644) Pulse Rate:  [65-85] 65 (11/15 1845) Resp:  [20] 20 (11/15 1644) BP: (99-124)/(53-68) 99/53 (11/15 1845) SpO2:  [100 %] 100 % (11/15 1845)  Physical exam:  General: An alert thin and pleasant 66 year old white female with a tracheostomy in place.  HEENT: Normocephalic, atraumatic  Neck: Patient has an approximately 3 cm dehiscence of her inferior cervical incision.  A spinous process is visible.  The wound itself does not appear particularly purulent.  There is no surrounding erythema  Thorax: Symmetric  Extremities: Unremarkable  Abdomen: Soft  Neurologic exam: The patient is alert and oriented.  She is moving all 4 extremities well. Data Review Lab Results  Component Value Date   WBC 5.3 02/08/2017   HGB 11.1 (L) 02/08/2017   HCT 34.1 (L) 02/08/2017   MCV 94.7  02/08/2017   PLT 270 02/08/2017  Lab Results  Component Value Date   NA 139 02/08/2017   K 3.3 (L) 02/08/2017   CL 105 02/08/2017   CO2 28 02/08/2017   BUN 12 02/08/2017   CREATININE 1.06 (H) 02/08/2017   GLUCOSE 92 02/08/2017   Lab Results  Component Value Date   INR 0.97 01/04/2017    Assessment/Plan: Wound dehiscence, possible wound infection: I have discussed the situation with the patient and her husband.  I have told him that I am concerned she may have a wound infection since the wound spontaneously opened.  I recommended cultures and a closure of her dehiscence.  I explained the procedure and the risks, benefits and alternatives.  She wants to proceed.  I will plan to admit the patient to start her on empiric antibiotics and await wound cultures.  She will likely need home IV antibiotics, PICC line, etc.  I have answered all their questions.   Ophelia Charter 02/08/2017 8:45 PM

## 2017-02-08 NOTE — ED Notes (Signed)
Pt ambulatory from lobby to E47 with EDT and steady gait noted

## 2017-02-08 NOTE — ED Notes (Signed)
Pt given turkey sandwich and diet coke  

## 2017-02-08 NOTE — Progress Notes (Signed)
Pharmacy Antibiotic Note  Anne [name] is a 66 y.o. female admitted on 02/08/2017 with wound dehiscence .  Pharmacy has been consulted for Vanc/ Ceftriaxone dosing.  Afebrile, WBC 5.3, Scr 1.06   Plan: Vancomycin 1000mg  IV X1 then 750mg  Q24H  Ceftriaxone 1GM IV Q24H   F/U renal function, clinical status, C&S, vanc trough at SS     Weight: 105 lb 13.1 oz (48 kg)  Temp (24hrs), Avg:98.5 F (36.9 C), Min:98.5 F (36.9 C), Max:98.5 F (36.9 C)  Recent Labs  Lab 02/08/17 1935  WBC 5.3  CREATININE 1.06*    Estimated Creatinine Clearance: 39 mL/min (A) (by C-G formula based on SCr of 1.06 mg/dL (H)).    Allergies  Allergen Reactions  . Influenza Vaccines Shortness Of Breath and Other (See Comments)    Also causes her to have achy, flu-like symptoms  . Shellfish Allergy Anaphylaxis  . Quinolones Other (See Comments)    Feet swell  . Asa [Aspirin] Swelling and Other (See Comments)    Tongue swelling  . Ciprofloxacin Swelling and Other (See Comments)    ALL OF THE "FLOXACIN"  . Levaquin [Levofloxacin In D5w] Other (See Comments)    Aches and swelling  . Peanut-Containing Drug Products Other (See Comments)    Wheezing   . Septra [Sulfamethoxazole-Trimethoprim] Diarrhea  . Adhesive [Tape] Rash and Other (See Comments)    Paper works fine   . Eggs Or Egg-Derived Products Other (See Comments)    GI Upset    Antimicrobials this admission: Vanc 11/15 >> CTX 11/15>>  Dose adjustments this admission:   Microbiology results: 11/15 DeepWound Cx:   Thank you for allowing pharmacy to be a part of this patient's care.  Felicity Pellegrini  PharmD Candidate '19 01/29/2017 3:30 PM

## 2017-02-08 NOTE — ED Notes (Signed)
Dr. Arnoldo Morale at bedside performing wound closure.

## 2017-02-08 NOTE — ED Triage Notes (Signed)
Pt in c/o post op problem, states she had neck surgery about a month ago and this afternoon her surgical site opened up, here for further evaluation, wound is covered with a bandage, denies other issues

## 2017-02-08 NOTE — Op Note (Signed)
Brief history: The patient is a 66 year old white female on whom Dr. Ronnald Ramp performed a cervical laminectomy on 01/10/2017.  The patient presented to the ER this evening with a wound dehiscence.  I discussed the situation with the patient and her husband and recommended obtaining wound cultures and a closure of her wound dehiscence.  The patient has decided to proceed with the procedure after weighing the risks, benefits and alternatives.  Preoperative diagnosis: Wound dehiscence  Postop diagnosis: The same  Procedure: Wound cultures, closure of wound dehiscence  Surgeon: Dr. Earle Gell  Assistant: None  Anesthesia: Local  Estimated blood loss: Minimal  Specimens: Cultures  Complications: None  Drains: None  Description of procedure: The patient remain in the sitting position.  Her posterior cervical region was then prepared with Betadine solution.  Sterile towels were applied.  I obtained wound cultures from the open wound.  I injected the edges of the wound dehiscence with lidocaine solution.  I used a running 2-0 nylon suture to reapproximate the patient's skin.  A sterile dressing was applied.  The drapes were removed.  The patient tolerated the procedure well.

## 2017-02-09 ENCOUNTER — Other Ambulatory Visit: Payer: Self-pay

## 2017-02-09 DIAGNOSIS — Z881 Allergy status to other antibiotic agents status: Secondary | ICD-10-CM

## 2017-02-09 DIAGNOSIS — T8131XA Disruption of external operation (surgical) wound, not elsewhere classified, initial encounter: Principal | ICD-10-CM

## 2017-02-09 DIAGNOSIS — Z91010 Allergy to peanuts: Secondary | ICD-10-CM

## 2017-02-09 DIAGNOSIS — Z887 Allergy status to serum and vaccine status: Secondary | ICD-10-CM

## 2017-02-09 DIAGNOSIS — Z91030 Bee allergy status: Secondary | ICD-10-CM

## 2017-02-09 DIAGNOSIS — Z882 Allergy status to sulfonamides status: Secondary | ICD-10-CM

## 2017-02-09 DIAGNOSIS — Z91012 Allergy to eggs: Secondary | ICD-10-CM

## 2017-02-09 DIAGNOSIS — Y838 Other surgical procedures as the cause of abnormal reaction of the patient, or of later complication, without mention of misadventure at the time of the procedure: Secondary | ICD-10-CM

## 2017-02-09 DIAGNOSIS — Z888 Allergy status to other drugs, medicaments and biological substances status: Secondary | ICD-10-CM

## 2017-02-09 DIAGNOSIS — Z886 Allergy status to analgesic agent status: Secondary | ICD-10-CM

## 2017-02-09 MED ORDER — VANCOMYCIN IV (FOR PTA / DISCHARGE USE ONLY): 750.0000 mg | INTRAVENOUS | 0 refills | Status: AC

## 2017-02-09 MED ORDER — VANCOMYCIN HCL IN DEXTROSE 750-5 MG/150ML-% IV SOLN
750.0000 mg | INTRAVENOUS | Status: DC
  Administered 2017-02-09: 750 mg via INTRAVENOUS
  Filled 2017-02-09 (×2): qty 150

## 2017-02-09 MED ORDER — CEFTRIAXONE IV (FOR PTA / DISCHARGE USE ONLY): 1.0000 g | INTRAVENOUS | 0 refills | Status: AC

## 2017-02-09 NOTE — Progress Notes (Signed)
Patient ID: Anne [name], female   DOB: November 08, 1950, 66 y.o.   MRN: [id number] Subjective: The patient is alert and pleasant.  She says "I feel the best I have since surgery".  She wants to go home if possible.  Objective: Vital signs in last 24 hours: Temp:  [97.8 F (36.6 C)-98.9 F (37.2 C)] 98.9 F (37.2 C) (11/16 0745) Pulse Rate:  [65-85] 78 (11/16 0729) Resp:  [16-20] 16 (11/16 0729) BP: (96-147)/(45-76) 104/56 (11/16 0404) SpO2:  [97 %-100 %] 99 % (11/16 0729) Weight:  [48 kg (105 lb 13.1 oz)-49.2 kg (108 lb 8 oz)] 49.2 kg (108 lb 8 oz) (11/15 2230)  Intake/Output from previous day: 11/15 0701 - 11/16 0700 In: 104.5 [I.V.:54.5; IV Piggyback:50] Out: -  Intake/Output this shift: No intake/output data recorded.  Physical exam patient is alert and oriented.  She looks well.  Her dressing has some blood staining.  Lab Results: Recent Labs    02/08/17 1935  WBC 5.3  HGB 11.1*  HCT 34.1*  PLT 270   BMET Recent Labs    02/08/17 1935  NA 139  K 3.3*  CL 105  CO2 28  GLUCOSE 92  BUN 12  CREATININE 1.06*  CALCIUM 9.1    Studies/Results: No results found.  Assessment/Plan: Wound dehiscence: I am concerned it may have dehisced because of an infection.  Empiric antibiotics have been started.  I will ask ID to see the patient to help US guide which antibiotics and for how long.  We will have a PICC line placed and arrange for home IV antibiotics.  I have answered all her questions.  LOS: 1 day     Anne [name] 02/09/2017, 7:49 AM

## 2017-02-09 NOTE — Progress Notes (Signed)
[place] pt for [facility] this hospital admission.  [facility] will provide [facility] services with ID team as ordered.  [facility] is prepared with weekend DC and will need scripts for IV ABX.   If patient discharges after hours, please call [phone].   Larry Sierras 02/09/2017, 3:08 PM

## 2017-02-09 NOTE — Progress Notes (Signed)
PHARMACY CONSULT NOTE FOR:  OUTPATIENT  PARENTERAL ANTIBIOTIC THERAPY (OPAT)  Indication: wound infection Regimen: ceftriaxone 1G IV q24 hours, vancomycin 750mg  IV q24 hours End date: 02/22/17  IV antibiotic discharge orders are pended. To discharging provider:  please sign these orders via discharge navigator,  Select New Orders & click on the button choice - Manage This Unsigned Work.     Thank you for allowing pharmacy to be a part of this patient's care.  Jodean Lima Shandreka Dante 02/09/2017, 3:07 PM

## 2017-02-09 NOTE — Progress Notes (Signed)
1900: Pt resting comfortably. Amenable to plan for PICC in the morning and d/c home.  0000: Pt continues resting comfortably.  0100: Pt noted to have diastolic BP 12T-62O all day, otherwise asymptomatic. Provider notified through [employer]. No new orders.  0400: Pt continues resting comfortably.   0700: Handoff report given to RN. No acute events overnight.

## 2017-02-09 NOTE — Consult Note (Signed)
[place] for Infectious Disease       Reason for Consult: wound infection    Referring Physician: Dr. Arnoldo Morale  Active Problems:   Wound dehiscence, surgical   . clopidogrel  75 mg Oral q morning - 10a  . dexamethasone  4 mg Oral BID WC  . docusate sodium  100 mg Oral BID  . estradiol  1 mg Oral Daily  . furosemide  20 mg Oral Daily  . gabapentin  300 mg Oral TID  . lidocaine-EPINEPHrine  20 mL Infiltration Once  . linaclotide  72 mcg Oral QAC breakfast  . loratadine  10 mg Oral Daily  . montelukast  10 mg Oral QHS  . mupirocin ointment  1 application Topical Daily  . pantoprazole  40 mg Oral Daily    Recommendations: Vancomycin and ceftriaxone pending culture results Would aim for 2 weeks through 11/29 Follow up in our clinic in 2 weeks I will place an OPAT consult We can watch cultures as an outpatient   Assessment: She has a wound infection with some dehiscence and drainage though no WBCs in gram stain and no pus.  I suspect superficial wound.     Antibiotics: Vancomycin and ceftriaxone yesterday  HPI: Anne [name] is a 66 y.o. female with cervical neck pain and s/p laminectomy and fusion C3-7 who came in with wound dehiscence and serosanguinous drainage.  No associated fever, no chills.  No rashes.  Dr. Arnoldo Morale did an I and D.  Patient feels well now otherwise.  No complaints.  Ready to go home.  Has a chronic trach.  Never has had a picc line.  Now without access.     Review of Systems:  Constitutional: negative for fevers, chills and malaise Gastrointestinal: negative for diarrhea Integument/breast: negative for rash All other systems reviewed and are negative    Past Medical History:  Diagnosis Date  . Allergic rhinitis   . Anemia   . Asthma   . Complication of anesthesia    Breathing problems. 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  . Compressed cervical disc   . COPD (chronic obstructive pulmonary disease) ([place])   . CVA (cerebral infarction)     . Dyspnea   . Esophageal dysmotility   . Heart murmur    as child  . History of hiatal hernia   . IBS (irritable bowel syndrome)   . Migraine   . Problems with swallowing    intermittently  . Pulmonary fibrosis ([place])   . Shingles   . Stroke [facility])    slurred speech, drawn face, imaging normal, occurred twice, [facility]  . Tracheostomy in place [facility])   . Vocal cord paresis     Social History   Tobacco Use  . Smoking status: Never Smoker  . Smokeless tobacco: Never Used  Substance Use Topics  . Alcohol use: No    Alcohol/week: 0.0 oz  . Drug use: No    Family History  Problem Relation Age of Onset  . Asthma Cousin   . COPD Cousin   . Breast cancer Maternal Grandmother 60  . Cancer Maternal Grandmother        breast  . Asthma Father   . Cancer Father        lung  . Kidney cancer Father   . Cancer Paternal Uncle        lung  . COPD Paternal Grandfather   . Breast cancer Maternal Aunt 14  . Cancer Maternal Aunt  breast  . Bladder Cancer Neg Hx     Allergies  Allergen Reactions  . Asa [Aspirin] Shortness Of Breath, Swelling and Other (See Comments)    Tongue swells  . Bee Venom Anaphylaxis and Shortness Of Breath  . Eggs Or Egg-Derived Products Shortness Of Breath and Other (See Comments)    Patient CAN eat these; just cannot tolerate if contained within a flu vaccine  . Influenza Vaccines Shortness Of Breath and Other (See Comments)    ONLY IF IT CONTAINS EGGS!!!!!  . Shellfish Allergy Anaphylaxis  . Quinolones Swelling and Other (See Comments)    Feet swell  . Ciprofloxacin Swelling and Other (See Comments)    ALL MEDS ENDING IN -FLOXACIN MAKE THE FEET SWELL  . Clarithromycin Nausea Only  . Erythromycin Nausea Only  . Levaquin [Levofloxacin In D5w] Swelling and Other (See Comments)    Feet and legs ache and SWELL  . Peanut-Containing Drug Products Other (See Comments)    Wheezing (boiled or raw peanuts)  . Septra [Sulfamethoxazole-Trimethoprim]  Diarrhea  . Telithromycin Nausea Only  . Zanaflex [Tizanidine] Other (See Comments)    Caused the patient to feel "spaced out" and "not well"  . Adhesive [Tape] Rash and Other (See Comments)    Paper works tape works fine   . Epinephrine Palpitations and Other (See Comments)    Also makes the heart race    Physical Exam: Constitutional: in no apparent distress and alert  Vitals:   02/09/17 1118 02/09/17 1200  BP:  (!) 109/55  Pulse:  73  Resp:  16  Temp: 98.6 F (37 C)   SpO2:  100%   EYES: anicteric ENMT: + trach Cardiovascular: Cor RRR Respiratory: CTA B; normal respiratory effort GI: Bowel sounds are normal, liver is not enlarged, spleen is not enlarged Musculoskeletal: no pedal edema noted Skin: negatives: no rash Hematologic: no cervical lad  Lab Results  Component Value Date   WBC 5.3 02/08/2017   HGB 11.1 (L) 02/08/2017   HCT 34.1 (L) 02/08/2017   MCV 94.7 02/08/2017   PLT 270 02/08/2017    Lab Results  Component Value Date   CREATININE 1.06 (H) 02/08/2017   BUN 12 02/08/2017   NA 139 02/08/2017   K 3.3 (L) 02/08/2017   CL 105 02/08/2017   CO2 28 02/08/2017    Lab Results  Component Value Date   ALT 11 12/13/2016   AST 16 12/13/2016   ALKPHOS 95 12/13/2016     Microbiology: Recent Results (from the past 240 hour(s))  Aerobic/Anaerobic Culture (surgical/deep wound)     Status: None (Preliminary result)   Collection Time: 02/08/17  8:45 PM  Result Value Ref Range Status   Specimen Description WOUND NECK  Final   Special Requests Normal  Final   Gram Stain NO WBC SEEN NO ORGANISMS SEEN   Final   Culture PENDING  Incomplete   Report Status PENDING  Incomplete    Birdena Kingma, Herbie [place], [place] for Infectious Disease [facility] [email or web address] [phone] pager  [phone] cell 02/09/2017, 12:46 PM

## 2017-02-09 NOTE — Care Management Note (Signed)
Case Management Note  Patient Details  Name: Anne [name] MRN: [id number] Date of Birth: 10-06-1950  Subjective/Objective:   66 yr old female admitted with wound infection and drainage.                  Action/Plan:  Case manager spoke with patient concerning discharge plan and need for IV antibiotics. Patient to get PICC today. She has had [place] recently and wants to do so now. CM called referral to [facility]. Patient will have family support at discharge.    Expected Discharge Date:  02/12/17               Expected Discharge Plan:  [place]  In-House Referral:     Discharge planning Services  CM Consult  Post Acute Care Choice:  Home Health Choice offered to:  Patient, Spouse  DME Arranged:    DME Agency:     HH Arranged:  RN [place] Agency:  [place]  Status of Service:  Completed, signed off  If discussed at [place] of Stay Meetings, dates discussed:    Additional Comments:  Ninfa Meeker, RN 02/09/2017, 1:56 PM

## 2017-02-10 MED ORDER — POLYVINYL ALCOHOL 1.4 % OP SOLN
1.0000 [drp] | Freq: Three times a day (TID) | OPHTHALMIC | Status: DC | PRN
  Administered 2017-02-10: 1 [drp] via OPHTHALMIC
  Filled 2017-02-10: qty 15

## 2017-02-10 MED ORDER — SODIUM CHLORIDE 0.9% FLUSH: 10.0000 mL | INTRAVENOUS | Status: DC | PRN

## 2017-02-10 MED ORDER — HEPARIN SOD (PORK) LOCK FLUSH 100 UNIT/ML IV SOLN: 250.0000 [IU] | INTRAVENOUS | Status: DC | PRN

## 2017-02-10 NOTE — Progress Notes (Signed)
Peripherally Inserted Central Catheter/Midline Placement  The IV Nurse has discussed with the patient and/or persons authorized to consent for the patient, the purpose of this procedure and the potential benefits and risks involved with this procedure.  The benefits include less needle sticks, lab draws from the catheter, and the patient may be discharged home with the catheter. Risks include, but not limited to, infection, bleeding, blood clot (thrombus formation), and puncture of an artery; nerve damage and irregular heartbeat and possibility to perform a PICC exchange if needed/ordered by physician.  Alternatives to this procedure were also discussed.  Bard Power PICC patient education guide, fact sheet on infection prevention and patient information card has been provided to patient /or left at bedside.    PICC/Midline Placement Documentation  PICC Single Lumen 37/48/27 PICC Right Basilic 37 cm 0 cm (Active)  Indication for Insertion or Continuance of Line Prolonged intravenous therapies 02/10/2017  8:00 AM  Exposed Catheter (cm) 0 cm 02/10/2017  8:00 AM  Site Assessment Clean;Dry;Intact 02/10/2017  8:00 AM  Line Status Flushed;Blood return noted 02/10/2017  8:00 AM  Dressing Type Transparent;Securing device 02/10/2017  8:00 AM  Dressing Status Clean;Dry;Intact;Antimicrobial disc in place 02/10/2017  8:00 AM  Line Care Connections checked and tightened 02/10/2017  8:00 AM  Dressing Intervention New dressing 02/10/2017  8:00 AM  Dressing Change Due 02/17/17 02/10/2017  8:00 AM       Rolena Infante 02/10/2017, 9:33 AM

## 2017-02-10 NOTE — Discharge Summary (Signed)
Physician Discharge Summary  Patient ID: TRICHA RUGGIRELLO MRN: [id number] DOB/AGE: 67-Jun-1952 67 y.o.  Admit date: 02/08/2017 Discharge date: 02/10/2017  Admission Diagnoses: Wound dehiscence  Discharge Diagnoses: The same Active Problems:   Wound dehiscence, surgical   Discharged Condition: good  Hospital Course: The patient was admitted on 02/08/2017 with a wound dehiscence.  I obtained wound cultures and close the patient's wound on 02/08/2017.  The patient was started on empiric vancomycin and ceftriaxone.  The patient was seen by infectious disease, Dr. Linus Salmons, who recommended 2 weeks of IV antibiotics.  A PICC line is to be placed today.  Arrangements have been made for home IV antibiotics.  The patient has requested discharge to home.  She was given discharge instructions and all her questions were answered.  She was instructed to follow-up with Dr. Ronnald Ramp next week.  Consults: Infectious disease Significant Diagnostic Studies: None Treatments: Closure of wound dehiscence Discharge Exam: Blood pressure 101/62, pulse 71, temperature 97.7 F (36.5 C), temperature source Oral, resp. rate 16, height 5' 3.5" (1.613 m), weight 49.2 kg (108 lb 8 oz), SpO2 99 %. The patient is alert and pleasant.  She looks well.  Her wound is healing well.  She is moving all 4 extremities well.  Disposition: Home with IV antibiotics  Discharge Instructions    Call MD for:  difficulty breathing, headache or visual disturbances   Complete by:  As directed    Call MD for:  extreme fatigue   Complete by:  As directed    Call MD for:  hives   Complete by:  As directed    Call MD for:  persistant dizziness or light-headedness   Complete by:  As directed    Call MD for:  persistant nausea and vomiting   Complete by:  As directed    Call MD for:  redness, tenderness, or signs of infection (pain, swelling, redness, odor or green/yellow discharge around incision site)   Complete by:  As directed     Call MD for:  severe uncontrolled pain   Complete by:  As directed    Call MD for:  temperature >100.4   Complete by:  As directed    Diet - low sodium heart healthy   Complete by:  As directed    Discharge instructions   Complete by:  As directed    Call [phone] for a followup appointment. Take a stool softener while you are using pain medications.   Driving Restrictions   Complete by:  As directed    Do not drive for 2 weeks.   Home infusion instructions [facility] May follow [place] Dosing Protocol; May administer Cathflo as needed to maintain patency of vascular access device.; Flushing of vascular access device: per [facility] Protocol: 0.9% NaCl pre/post medica...   Complete by:  As directed    Instructions:  May follow [place] Dosing Protocol   Instructions:  May administer Cathflo as needed to maintain patency of vascular access device.   Instructions:  Flushing of vascular access device: per [facility] Protocol: 0.9% NaCl pre/post medication administration and prn patency; Heparin 100 u/ml, 66m for implanted ports and Heparin 10u/ml, 540mfor all other central venous catheters.   Instructions:  May follow [facility] Anaphylaxis Protocol for First Dose Administration in the home: 0.9% NaCl at 25-50 ml/hr to maintain IV access for protocol meds. Epinephrine 0.3 ml IV/IM PRN and Benadryl 25-50 IV/IM PRN s/s of anaphylaxis.   Instructions:  [facility] Infusion  Coordinator (RN) to assist per patient IV care needs in the home PRN.   Increase activity slowly   Complete by:  As directed    Lifting restrictions   Complete by:  As directed    Do not lift more than 5 pounds. No excessive bending or twisting.   May shower / Bathe   Complete by:  As directed    He may shower after the pain she is removed 3 days after surgery. Leave the incision alone.   Remove dressing in 48 hours   Complete by:  As directed    Your stitches are under the scan and will dissolve by themselves. The  Steri-Strips will fall off after you take a few showers. Do not rub back or pick at the wound, Leave the wound alone.     Allergies as of 02/10/2017      Reactions   Asa [aspirin] Shortness Of Breath, Swelling, Other (See Comments)   Tongue swells   Bee Venom Anaphylaxis, Shortness Of Breath   Eggs Or Egg-derived Products Shortness Of Breath, Other (See Comments)   Patient CAN eat these; just cannot tolerate if contained within a flu vaccine   Influenza Vaccines Shortness Of Breath, Other (See Comments)   ONLY IF IT CONTAINS EGGS!!!!!   Shellfish Allergy Anaphylaxis   Quinolones Swelling, Other (See Comments)   Feet swell   Ciprofloxacin Swelling, Other (See Comments)   ALL MEDS ENDING IN -FLOXACIN MAKE THE FEET SWELL   Clarithromycin Nausea Only   Erythromycin Nausea Only   Levaquin [levofloxacin In D5w] Swelling, Other (See Comments)   Feet and legs ache and SWELL   Peanut-containing Drug Products Other (See Comments)   Wheezing (boiled or raw peanuts)   Septra [sulfamethoxazole-trimethoprim] Diarrhea   Telithromycin Nausea Only   Zanaflex [tizanidine] Other (See Comments)   Caused the patient to feel "spaced out" and "not well"   Adhesive [tape] Rash, Other (See Comments)   Paper works tape works fine    Epinephrine Palpitations, Other (See Comments)   Also makes the heart race      Medication List    STOP taking these medications   ibuprofen 200 MG tablet Commonly known as:  ADVIL,MOTRIN     TAKE these medications   acetaminophen 500 MG tablet Commonly known as:  TYLENOL Take 500 mg by mouth daily as needed for moderate pain or headache.   albuterol 108 (90 Base) MCG/ACT inhaler Commonly known as:  PROVENTIL HFA;VENTOLIN HFA Inhale 2 puffs into the lungs every 6 (six) hours as needed for wheezing or shortness of breath.   ALPRAZolam 0.25 MG tablet Commonly known as:  XANAX TAKE 1/2 TABLET BY MOUTH TWICE DAILY AS NEEDED What changed:  See the new instructions.    cefTRIAXone IVPB Commonly known as:  ROCEPHIN Inject 1 g daily for 13 days into the vein. Indication:  Wound Infection Last Day of Therapy:  02/22/17 Labs - Once weekly:  CBC/D and BMP, Labs - Every other week:  ESR and CRP   cetirizine 10 MG tablet Commonly known as:  ZYRTEC TAKE ONE TABLET BY MOUTH EVERY DAY What changed:    how much to take  how to take this  when to take this   clopidogrel 75 MG tablet Commonly known as:  PLAVIX TAKE ONE TABLET BY MOUTH EVERY MORNING What changed:    how much to take  how to take this  when to take this   EPINEPHrine 0.3 mg/0.3 mL Soaj injection  Commonly known as:  EPIPEN 2-PAK Inject 0.3 mLs (0.3 mg total) into the muscle once. What changed:    when to take this  reasons to take this   estradiol 1 MG tablet Commonly known as:  ESTRACE TAKE ONE TABLET EVERY DAY What changed:    how much to take  how to take this  when to take this   fluticasone 50 MCG/ACT nasal spray Commonly known as:  FLONASE PLACE 2 SPRAYS INTO BOTH NOSTRILS DAILY. What changed:    when to take this  reasons to take this   furosemide 20 MG tablet Commonly known as:  LASIX Take 20 mg by mouth daily.   gabapentin 300 MG capsule Commonly known as:  NEURONTIN Take 1 capsule (300 mg total) by mouth 3 (three) times daily. What changed:  when to take this   hydroxypropyl methylcellulose / hypromellose 2.5 % ophthalmic solution Commonly known as:  ISOPTO TEARS / GONIOVISC Place 1 drop into both eyes 3 (three) times daily as needed for dry eyes.   ipratropium-albuterol 0.5-2.5 (3) MG/3ML Soln Commonly known as:  DUONEB Take 3 mLs by nebulization every 4 (four) hours as needed. Dx 496 What changed:    reasons to take this  additional instructions   linaclotide 72 MCG capsule Commonly known as:  LINZESS Take 1 capsule (72 mcg total) by mouth daily before breakfast. What changed:    when to take this  reasons to take this    methocarbamol 500 MG tablet Commonly known as:  ROBAXIN Take 1 tablet (500 mg total) by mouth every 6 (six) hours as needed for muscle spasms.   montelukast 10 MG tablet Commonly known as:  SINGULAIR Take 10 mg by mouth at bedtime.   mupirocin ointment 2 % Commonly known as:  BACTROBAN Apply 1 application daily as needed topically (FOR TRACH SITE IRRITATION).   oxyCODONE 5 MG immediate release tablet Commonly known as:  Oxy IR/ROXICODONE Take 1 tablet (5 mg total) by mouth every 6 (six) hours as needed for breakthrough pain.   pantoprazole 40 MG tablet Commonly known as:  PROTONIX Take 1 tablet (40 mg total) by mouth daily.   potassium chloride 20 MEQ/15ML (10%) Soln Take 15 mLs (20 mEq total) by mouth daily as needed (cramps). What changed:  reasons to take this   PROBIOTIC PO Take 1 capsule daily as needed by mouth (FOR G.I. DISTRESS).   SALONPAS PAIN RELIEF PATCH EX Apply 1-2 patches daily as needed topically (FOR SHOULDER PAIN).   sodium chloride 0.65 % Soln nasal spray Commonly known as:  OCEAN Place 1 spray into both nostrils every 4 (four) hours as needed for congestion.   vancomycin IVPB Inject 750 mg daily for 13 days into the vein. Indication:  Wound Infection Last Day of Therapy:  02/22/17 Labs - Sunday/Monday:  CBC/D, BMP, and vancomycin trough. Labs - Thursday:  BMP and vancomycin trough Labs - Every other week:  ESR and CRP   zolmitriptan 5 MG disintegrating tablet Commonly known as:  ZOMIG-ZMT Take 1 tablet (5 mg total) by mouth daily as needed for migraine.            Home Infusion Instuctions  (From admission, onward)        Start     Ordered   02/09/17 0000  Home infusion instructions [facility] May follow [place] Dosing Protocol; May administer Cathflo as needed to maintain patency of vascular access device.; Flushing of vascular access device: per [facility]  Protocol: 0.9% NaCl pre/post medica...    Question Answer Comment   Instructions May follow [place] Dosing Protocol   Instructions May administer Cathflo as needed to maintain patency of vascular access device.   Instructions Flushing of vascular access device: per [facility] Protocol: 0.9% NaCl pre/post medication administration and prn patency; Heparin 100 u/ml, 87m for implanted ports and Heparin 10u/ml, 524mfor all other central venous catheters.   Instructions May follow [facility] Anaphylaxis Protocol for First Dose Administration in the home: 0.9% NaCl at 25-50 ml/hr to maintain IV access for protocol meds. Epinephrine 0.3 ml IV/IM PRN and Benadryl 25-50 IV/IM PRN s/s of anaphylaxis.   Instructions [facility] Infusion Coordinator (RN) to assist per patient IV care needs in the home PRN.      02/09/17 1506     Follow-up Information    Schedule an appointment as soon as possible for a visit  with JoEustace MooreMD.   Specialty:  Neurosurgery Contact information: [address] [place] [phone]         Signed: JeOphelia Charter1/17/2018, 8:22 AM

## 2017-02-10 NOTE — Care Management Note (Signed)
Case Management Note  Patient Details  Name: Anne [name] MRN: [id number] Date of Birth: 21-Aug-1950  Subjective/Objective:  66 y.o. To be discharged with [facility] and IV abx. CM confirmed with Brad, rep with [facility] that all Rx's had been received, DME arranged and start of care would be today at 1700.                   Action/Plan:CM will sign off for now but will be available should additional discharge needs arise or disposition change.    Expected Discharge Date:  02/10/17               Expected Discharge Plan:  [place]  In-House Referral:  NA  Discharge planning Services  CM Consult  Post Acute Care Choice:  Home Health Choice offered to:  Patient, Spouse  DME Arranged:    DME Agency:     HH Arranged:  RN [place] Agency:  [facility]  Status of Service:  Completed, signed off  If discussed at [place] of Stay Meetings, dates discussed:    Additional Comments:  Delrae Sawyers, RN 02/10/2017, 11:43 AM

## 2017-02-10 NOTE — Progress Notes (Signed)
Pt was giving education related to discharge: f/u apts, medication, home health and when to call Physician's office/911. Pt had no questions or concerns. Husband at bedside along with pt.

## 2017-02-10 NOTE — Discharge Instructions (Signed)
Wound Dehiscence Wound dehiscence is when a cut from surgery (an incision) opens up and does not heal like it should. This problem usually happens 7-10 days after surgery. You may have bleeding from the cut. You may also have pain or a fever. This condition should be treated early. Follow these instructions at home: Medicines  Take over-the-counter and prescription medicines only as told by your doctor.  If you were prescribed an antibiotic medicine, take it as told by your doctor. Do not stop taking it even if you start to feel better.  Use medicines that help to stop itching as told by your doctor. The wound may feel itchy as it heals. Wound care  Follow instructions from your doctor about how to take care of your wound. Make sure you: ? Wash your hands with soap and water before you change your bandage (dressing) or wash the wound area. If you cannot use soap and water, use hand sanitizer. ? Wash your wound with mild soap and water 2 times a day, or as told. Rinse off the soap. Pat the area dry with a clean towel. Do not rub the wound. ? Change bandages as told by your doctor.  Do not pick or scratch at the wound.  Check your wound area every day for signs of infection. Check for: ? More redness, swelling, or pain. ? More fluid or blood. ? Warmth. ? Pus or a bad smell. Activity  Avoid exercises that make you sweat or could stretch your wound.  Do not lift anything that is heavier than 10 lb (4.5 kg) until the wound is healed or until your doctor says that it is safe. General instructions  Do not take baths, swim, or use a hot tub until your doctor approves. You may take showers.  Keep all follow-up visits as told by your doctor. This is important. Contact a doctor if:  Your wound does not seem to be healing right.  You have a fever. Get help right away if:  You have more redness, swelling, or pain around your wound.  You have more fluid coming from your wound.  Your  wound feels warm to the touch.  You have pus or a bad smell coming from your wound.  More of the wound breaks open.  You have red streaks spreading from your wound.  You have a lot of bleeding from your wound. Summary  Wound dehiscence is when a cut from surgery (an incision) opens up and does not heal like it should.  Follow instructions from your doctor about how to take care of your wound.  Check your wound area every day for signs of infection. These signs can be redness, swelling, pain, fluid, blood, warmth, pus, or a bad smell.  If you were prescribed an antibiotic medicine, take it as told by your doctor. Do not stop taking it even if you start to feel better.  Contact a doctor if your wound does not seem to be healing right. This information is not intended to replace advice given to you by your health care provider. Make sure you discuss any questions you have with your health care provider. Document Released: 03/01/2009 Document Revised: 02/16/2016 Document Reviewed: 02/16/2016 Elsevier Interactive Patient Education  2017 [place] A peripherally inserted central catheter (PICC) is a long, thin, flexible tube that is inserted into a vein in the upper arm. It is a form of intravenous (IV) access. It is considered to be a "central"  line because the tip of the PICC ends in a large vein in your chest. This large vein is called the superior vena cava (SVC). The PICC tip ends in the SVC because there is a lot of blood flow in the SVC. This allows medicines and IV fluids to be quickly distributed throughout the body. The PICC is inserted using a sterile technique by a specially trained nurse or physician. After the PICC is inserted, a chest X-ray exam is done to be sure it is in the correct place. A PICC may be placed for different reasons, such as: To give medicines and liquid nutrition that can only be given through a central line. Examples are: Certain  antibiotic treatments. Chemotherapy. Total parenteral nutrition (TPN). To take frequent blood samples. To give IV fluids and blood products. If there is difficulty placing a peripheral intravenous (PIV) catheter.  If taken care of properly, a PICC can remain in place for several months. A PICC can also allow a person to go home from the hospital early. Medicine and PICC care can be managed at home by a family member or home health care team. What problems can happen when I have a PICC? Problems with a PICC can occasionally occur. These may include the following: A blood clot (thrombus) forming in or at the tip of the PICC. This can cause the PICC to become clogged. A clot-dissolving medicine called tissue plasminogen activator (tPA) can be given through the PICC to help break up the clot. Inflammation of the vein (phlebitis) in which the PICC is placed. Signs of inflammation may include redness, pain at the insertion site, red streaks, or being able to feel a "cord" in the vein where the PICC is located. Infection in the PICC or at the insertion site. Signs of infection may include fever, chills, redness, swelling, or pus drainage from the PICC insertion site. PICC movement (malposition). The PICC tip may move from its original position due to excessive physical activity, forceful coughing, sneezing, or vomiting. A break or cut in the PICC. It is important to not use scissors near the PICC. Nerve or tendon irritation or injury during PICC insertion.  What should I keep in mind about activities when I have a PICC? You may bend your arm and move it freely. If your PICC is near or at the bend of your elbow, avoid activity with repeated motion at the elbow. Rest at home for the remainder of the day following PICC line insertion. Avoid lifting heavy objects as instructed by your health care provider. Avoid using a crutch with the arm on the same side as your PICC. You may need to use a walker. What  should I know about my PICC dressing? Keep your PICC bandage (dressing) clean and dry to prevent infection. Ask your health care provider when you may shower. Ask your health care provider to teach you how to wrap the PICC when you do take a shower. Change the PICC dressing as instructed by your health care provider. Change your PICC dressing if it becomes loose or wet. What should I know about PICC care? Check the PICC insertion site daily for leakage, redness, swelling, or pain. Do not take a bath, swim, or use hot tubs when you have a PICC. Cover PICC line with clear plastic wrap and tape to keep it dry while showering. Flush the PICC as directed by your health care provider. Let your health care provider know right away if the PICC is difficult  to flush or does not flush. Do not use force to flush the PICC. Do not use a syringe that is less than 10 mL to flush the PICC. Never pull or tug on the PICC. Avoid blood pressure checks on the arm with the PICC. Keep your PICC identification card with you at all times. Do not take the PICC out yourself. Only a trained clinical professional should remove the PICC. Get help right away if: Your PICC is accidentally pulled all the way out. If this happens, cover the insertion site with a bandage or gauze dressing. Do not throw the PICC away. Your health care provider will need to inspect it. Your PICC was tugged or pulled and has partially come out. Do not  push the PICC back in. There is any type of drainage, redness, or swelling where the PICC enters the skin. You cannot flush the PICC, it is difficult to flush, or the PICC leaks around the insertion site when it is flushed. You hear a "flushing" sound when the PICC is flushed. You have pain, discomfort, or numbness in your arm, shoulder, or jaw on the same side as the PICC. You feel your heart "racing" or skipping beats. You notice a hole or tear in the PICC. You develop chills or a fever. This  information is not intended to replace advice given to you by your health care provider. Make sure you discuss any questions you have with your health care provider. Document Released: 09/17/2002 Document Revised: 10/01/2015 Document Reviewed: 01/03/2013 Elsevier Interactive Patient Education  2017 [employer].

## 2017-02-14 LAB — AEROBIC/ANAEROBIC CULTURE W GRAM STAIN (SURGICAL/DEEP WOUND): Culture: NO GROWTH

## 2017-02-14 LAB — AEROBIC/ANAEROBIC CULTURE (SURGICAL/DEEP WOUND)
Gram Stain: NONE SEEN
Special Requests: NORMAL

## 2017-02-19 ENCOUNTER — Telehealth: Payer: Self-pay | Admitting: *Deleted

## 2017-02-19 NOTE — Telephone Encounter (Signed)
Patient called [facility]. She states her nurse tried to call the office, but was told that the patient doesn't exist. She states her nurse asked her to call for orders instead.  RN explained that as an outpatient clinic, we are unable to access hospital records, and cannot answer questions until the patient comes into clinic.  RN advised that her [facility] should have [facility] protocols and should know to contact [facility] pharmacy or page the physician directly.  RN apologized for the run around, asked how I could help. Patient cancelled her HSFU with Dr Linus Salmons, as she will be going back to surgery that day for wound vac placement. She would like her PICC line out before surgery, preferably today.  RN spoke with Dr Linus Salmons, ok per him to have the PICC out. Patient was scheduled to finish 11/29. RN relayed verbal order per Dr Linus Salmons to [facility] at [place] to pull PICC.  Additionally, RN asked for [facility] make sure their nurses have the protocol sheet and are familiar with it. Landis Gandy, RN

## 2017-02-20 ENCOUNTER — Other Ambulatory Visit: Payer: Self-pay

## 2017-02-20 ENCOUNTER — Ambulatory Visit: Payer: Self-pay | Admitting: Plastic Surgery

## 2017-02-20 ENCOUNTER — Encounter (HOSPITAL_COMMUNITY): Payer: Self-pay | Admitting: *Deleted

## 2017-02-20 DIAGNOSIS — S1190XA Unspecified open wound of unspecified part of neck, initial encounter: Secondary | ICD-10-CM

## 2017-02-20 DIAGNOSIS — Z452 Encounter for adjustment and management of vascular access device: Secondary | ICD-10-CM

## 2017-02-20 HISTORY — DX: Encounter for adjustment and management of vascular access device: Z45.2

## 2017-02-20 NOTE — Anesthesia Preprocedure Evaluation (Addendum)
Anesthesia Evaluation  Patient identified by MRN, date of birth, ID band Patient awake    Reviewed: Allergy & Precautions, H&P , NPO status , Patient's Chart, lab work & pertinent test results  Airway Mallampati: II  TM Distance: >3 FB Neck ROM: full   Comment: Trach in place Dental no notable dental hx.    Pulmonary asthma , COPD,    breath sounds clear to auscultation       Cardiovascular negative cardio ROS   Rhythm:regular Rate:Normal     Neuro/Psych  Neuromuscular disease CVA    GI/Hepatic Neg liver ROS, hiatal hernia, GERD  Medicated,  Endo/Other  negative endocrine ROS  Renal/GU negative Renal ROS     Musculoskeletal   Abdominal   Peds  Hematology  (+) anemia ,   Anesthesia Other Findings   Reproductive/Obstetrics                            Lab Results  Component Value Date   WBC 5.3 02/08/2017   HGB 11.1 (L) 02/08/2017   HCT 34.1 (L) 02/08/2017   MCV 94.7 02/08/2017   PLT 270 02/08/2017   Lab Results  Component Value Date   CREATININE 1.06 (H) 02/08/2017   BUN 12 02/08/2017   NA 139 02/08/2017   K 3.3 (L) 02/08/2017   CL 105 02/08/2017   CO2 28 02/08/2017    Anesthesia Physical  Anesthesia Plan  ASA: III  Anesthesia Plan: General   Post-op Pain Management:    Induction: Intravenous  PONV Risk Score and Plan: 3 and Ondansetron, Dexamethasone and Treatment may vary due to age or medical condition  Airway Management Planned: Tracheostomy  Additional Equipment:   Intra-op Plan:   Post-operative Plan: Possible Post-op intubation/ventilation  Informed Consent: I have reviewed the patients History and Physical, chart, labs and discussed the procedure including the risks, benefits and alternatives for the proposed anesthesia with the patient or authorized representative who has indicated his/her understanding and acceptance.   Dental advisory given  Plan  Discussed with: CRNA  Anesthesia Plan Comments: (  )       Anesthesia Quick Evaluation

## 2017-02-20 NOTE — H&P (Signed)
Anne [name] is an 66 y.o. female.   Chief Complaint: neck wound HPI: The patient is a 66 y.o. yrs old wf here for a neck wound.  She underwent a cervical laminectomy on 01/10/2018 by Dr. Ronnald Ramp for cervical spondylosis and stenosis.  The wound opened and was sutured in the ED last week.  It has opened again.  There does not appear to be hardware exposed.  She has a PICC line now and cultures are pending.  She has not been eating well or healthy due to some trouble with swallowing.  The wound is ~ 1 x 10 cm in size.  Her trach strap rests on the superior aspect of the wound.  She has not been dressing the area with anything at this time.    Past Medical History:  Diagnosis Date  . Allergic rhinitis   . Anemia   . Asthma   . Complication of anesthesia    Breathing problems. 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  . Compressed cervical disc   . COPD (chronic obstructive pulmonary disease) ([place])   . CVA (cerebral infarction)   . Dyspnea   . Esophageal dysmotility   . Heart murmur    as child  . History of hiatal hernia   . IBS (irritable bowel syndrome)   . Migraine   . Problems with swallowing    intermittently  . Pulmonary fibrosis ([place])   . Shingles   . Stroke [facility])    slurred speech, drawn face, imaging normal, occurred twice, [facility]  . Tracheostomy in place [facility])   . Vocal cord paresis     Past Surgical History:  Procedure Laterality Date  . ABDOMINAL HYSTERECTOMY    . BOTOX INJECTION N/A 07/29/2013   Procedure: BOTOX INJECTION;  Surgeon: Jerene Bears, MD;  [facility];  Service: Gastroenterology;  Laterality: N/A;  . BOTOX INJECTION N/A 05/04/2015   Procedure: BOTOX INJECTION;  Surgeon: Jerene Bears, MD;  [facility];  Service: Gastroenterology;  Laterality: N/A;  . BREAST SURGERY  2002   bx of skin  . CHOLECYSTECTOMY    . COLONOSCOPY    . ESOPHAGEAL MANOMETRY N/A 12/16/2012   Procedure: ESOPHAGEAL MANOMETRY (EM);  Surgeon: Jerene Bears, MD;  [facility];  Service: Gastroenterology;  Laterality: N/A;  . ESOPHAGOGASTRODUODENOSCOPY (EGD) WITH PROPOFOL N/A 07/29/2013   Procedure: ESOPHAGOGASTRODUODENOSCOPY (EGD) WITH PROPOFOL;  Surgeon: Jerene Bears, MD;  [facility];  Service: Gastroenterology;  Laterality: N/A;  with botox injection  . ESOPHAGOGASTRODUODENOSCOPY (EGD) WITH PROPOFOL N/A 05/04/2015   Procedure: ESOPHAGOGASTRODUODENOSCOPY (EGD) WITH PROPOFOL;  Surgeon: Jerene Bears, MD;  [facility];  Service: Gastroenterology;  Laterality: N/A;  . EYE SURGERY     Catarct surgery 2014  . Eye Surgery AS Child    . JEJUNOSTOMY FEEDING TUBE     x2 both failed. no longer has  . MULTIPLE TOOTH EXTRACTIONS     2 teeth removed  . POSTERIOR CERVICAL FUSION/FORAMINOTOMY N/A 01/10/2017   Procedure: LAMINECTOMY AND FORAMINOTOMY CERVICAL FOUR-CERVICAL FIVE, CERVICAL FIVE-SIX POSTERIOR CERVICAL INSTRUMENT FUSION CERVICAL THREE-CERVICAL SEVEN,CERVICAL LAMINECTOMY CERVICAL THREE-CERVICAL SEVEN.;  Surgeon: Eustace Moore, MD;  Location: [place];  Service: Neurosurgery;  Laterality: N/A;  posterior  . TRACHEOSTOMY  1996   done at [facility], Dr. Kathyrn Sheriff  . TUBAL LIGATION    . VIDEO BRONCHOSCOPY Bilateral 11/20/2012   Procedure: VIDEO BRONCHOSCOPY WITH FLUORO;  Surgeon: Juanito Doom, MD;  [facility];  Service: Cardiopulmonary;  Laterality: Bilateral;  Family History  Problem Relation Age of Onset  . Asthma Cousin   . COPD Cousin   . Breast cancer Maternal Grandmother 60  . Cancer Maternal Grandmother        breast  . Asthma Father   . Cancer Father        lung  . Kidney cancer Father   . Cancer Paternal Uncle        lung  . COPD Paternal Grandfather   . Breast cancer Maternal Aunt 23  . Cancer Maternal Aunt        breast  . Bladder Cancer Neg Hx    Social History:  reports that  has never smoked. she has never used smokeless tobacco. She reports that she does not drink alcohol or use drugs.  Allergies:    Allergies  Allergen Reactions  . Asa [Aspirin] Shortness Of Breath, Swelling and Other (See Comments)    Tongue swells  . Bee Venom Anaphylaxis and Shortness Of Breath  . Eggs Or Egg-Derived Products Shortness Of Breath and Other (See Comments)    Patient CAN eat these; just cannot tolerate if contained within a flu vaccine  . Influenza Vaccines Shortness Of Breath and Other (See Comments)    ONLY IF IT CONTAINS EGGS!!!!!  . Shellfish Allergy Anaphylaxis  . Quinolones Swelling and Other (See Comments)    Feet swell  . Ciprofloxacin Swelling and Other (See Comments)    ALL MEDS ENDING IN -FLOXACIN MAKE THE FEET SWELL  . Clarithromycin Nausea Only  . Erythromycin Nausea Only  . Levaquin [Levofloxacin In D5w] Swelling and Other (See Comments)    Feet and legs ache and SWELL  . Peanut-Containing Drug Products Other (See Comments)    Wheezing (boiled or raw peanuts)  . Septra [Sulfamethoxazole-Trimethoprim] Diarrhea  . Telithromycin Nausea Only  . Zanaflex [Tizanidine] Other (See Comments)    Caused the patient to feel "spaced out" and "not well"  . Adhesive [Tape] Rash and Other (See Comments)    Paper works tape works fine   . Epinephrine Palpitations and Other (See Comments)    Also makes the heart race     (Not in a hospital admission)  No results found for this or any previous visit (from the past 48 hour(s)). No results found.  Review of Systems  Constitutional: Negative.   Eyes: Negative.   Respiratory: Negative.   Cardiovascular: Negative.   Gastrointestinal: Negative.   Genitourinary: Negative.   Musculoskeletal: Positive for neck pain.  Skin: Negative.   Neurological: Negative.   Psychiatric/Behavioral: Negative.     There were no vitals taken for this visit. Physical Exam  Constitutional: She is oriented to person, place, and time. She appears well-developed and well-nourished.  HENT:  Head: Normocephalic.  Eyes: Pupils are equal, round, and reactive to  light.  Neck:    Cardiovascular: Normal rate.  Respiratory: Effort normal.  GI: Soft. She exhibits no distension.  Neurological: She is alert and oriented to person, place, and time.  Skin: Skin is warm.  Psychiatric: She has a normal mood and affect. Her behavior is normal. Judgment and thought content normal.     Assessment/Plan Plan debridement with Dr. Ronnald Ramp and placement of Acell and VAC.  [place], DO 02/20/2017, 7:19 AM

## 2017-02-20 NOTE — Telephone Encounter (Signed)
Patient canceled and will have picc line removed.  Message sent to Dr. Arnoldo Morale.

## 2017-02-20 NOTE — Progress Notes (Signed)
Anne [name] is on Plavix , ordered by her PCP.  Patient reports that she is on Plavix because she had TIA's in the past. Patient reports that she has been off 7 days.  I called Dr Eusebio Friendly office and asked if the office had permission from PCP to stop Plavix. I asked that the office call me back.

## 2017-02-20 NOTE — H&P (View-Only) (Signed)
Anne [name] is an 66 y.o. female.   Chief Complaint: neck wound HPI: The patient is a 66 y.o. yrs old wf here for a neck wound.  She underwent a cervical laminectomy on 01/10/2018 by Dr. Ronnald Ramp for cervical spondylosis and stenosis.  The wound opened and was sutured in the ED last week.  It has opened again.  There does not appear to be hardware exposed.  She has a PICC line now and cultures are pending.  She has not been eating well or healthy due to some trouble with swallowing.  The wound is ~ 1 x 10 cm in size.  Her trach strap rests on the superior aspect of the wound.  She has not been dressing the area with anything at this time.    Past Medical History:  Diagnosis Date  . Allergic rhinitis   . Anemia   . Asthma   . Complication of anesthesia    Breathing problems. 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  . Compressed cervical disc   . COPD (chronic obstructive pulmonary disease) ([place])   . CVA (cerebral infarction)   . Dyspnea   . Esophageal dysmotility   . Heart murmur    as child  . History of hiatal hernia   . IBS (irritable bowel syndrome)   . Migraine   . Problems with swallowing    intermittently  . Pulmonary fibrosis ([place])   . Shingles   . Stroke [facility])    slurred speech, drawn face, imaging normal, occurred twice, [facility]  . Tracheostomy in place [facility])   . Vocal cord paresis     Past Surgical History:  Procedure Laterality Date  . ABDOMINAL HYSTERECTOMY    . BOTOX INJECTION N/A 07/29/2013   Procedure: BOTOX INJECTION;  Surgeon: Jerene Bears, MD;  [facility];  Service: Gastroenterology;  Laterality: N/A;  . BOTOX INJECTION N/A 05/04/2015   Procedure: BOTOX INJECTION;  Surgeon: Jerene Bears, MD;  [facility];  Service: Gastroenterology;  Laterality: N/A;  . BREAST SURGERY  2002   bx of skin  . CHOLECYSTECTOMY    . COLONOSCOPY    . ESOPHAGEAL MANOMETRY N/A 12/16/2012   Procedure: ESOPHAGEAL MANOMETRY (EM);  Surgeon: Jerene Bears, MD;  [facility];  Service: Gastroenterology;  Laterality: N/A;  . ESOPHAGOGASTRODUODENOSCOPY (EGD) WITH PROPOFOL N/A 07/29/2013   Procedure: ESOPHAGOGASTRODUODENOSCOPY (EGD) WITH PROPOFOL;  Surgeon: Jerene Bears, MD;  [facility];  Service: Gastroenterology;  Laterality: N/A;  with botox injection  . ESOPHAGOGASTRODUODENOSCOPY (EGD) WITH PROPOFOL N/A 05/04/2015   Procedure: ESOPHAGOGASTRODUODENOSCOPY (EGD) WITH PROPOFOL;  Surgeon: Jerene Bears, MD;  [facility];  Service: Gastroenterology;  Laterality: N/A;  . EYE SURGERY     Catarct surgery 2014  . Eye Surgery AS Child    . JEJUNOSTOMY FEEDING TUBE     x2 both failed. no longer has  . MULTIPLE TOOTH EXTRACTIONS     2 teeth removed  . POSTERIOR CERVICAL FUSION/FORAMINOTOMY N/A 01/10/2017   Procedure: LAMINECTOMY AND FORAMINOTOMY CERVICAL FOUR-CERVICAL FIVE, CERVICAL FIVE-SIX POSTERIOR CERVICAL INSTRUMENT FUSION CERVICAL THREE-CERVICAL SEVEN,CERVICAL LAMINECTOMY CERVICAL THREE-CERVICAL SEVEN.;  Surgeon: Eustace Moore, MD;  Location: [place];  Service: Neurosurgery;  Laterality: N/A;  posterior  . TRACHEOSTOMY  1996   done at [facility], Dr. Kathyrn Sheriff  . TUBAL LIGATION    . VIDEO BRONCHOSCOPY Bilateral 11/20/2012   Procedure: VIDEO BRONCHOSCOPY WITH FLUORO;  Surgeon: Juanito Doom, MD;  [facility];  Service: Cardiopulmonary;  Laterality: Bilateral;  Family History  Problem Relation Age of Onset  . Asthma Cousin   . COPD Cousin   . Breast cancer Maternal Grandmother 60  . Cancer Maternal Grandmother        breast  . Asthma Father   . Cancer Father        lung  . Kidney cancer Father   . Cancer Paternal Uncle        lung  . COPD Paternal Grandfather   . Breast cancer Maternal Aunt 18  . Cancer Maternal Aunt        breast  . Bladder Cancer Neg Hx    Social History:  reports that  has never smoked. she has never used smokeless tobacco. She reports that she does not drink alcohol or use drugs.  Allergies:    Allergies  Allergen Reactions  . Asa [Aspirin] Shortness Of Breath, Swelling and Other (See Comments)    Tongue swells  . Bee Venom Anaphylaxis and Shortness Of Breath  . Eggs Or Egg-Derived Products Shortness Of Breath and Other (See Comments)    Patient CAN eat these; just cannot tolerate if contained within a flu vaccine  . Influenza Vaccines Shortness Of Breath and Other (See Comments)    ONLY IF IT CONTAINS EGGS!!!!!  . Shellfish Allergy Anaphylaxis  . Quinolones Swelling and Other (See Comments)    Feet swell  . Ciprofloxacin Swelling and Other (See Comments)    ALL MEDS ENDING IN -FLOXACIN MAKE THE FEET SWELL  . Clarithromycin Nausea Only  . Erythromycin Nausea Only  . Levaquin [Levofloxacin In D5w] Swelling and Other (See Comments)    Feet and legs ache and SWELL  . Peanut-Containing Drug Products Other (See Comments)    Wheezing (boiled or raw peanuts)  . Septra [Sulfamethoxazole-Trimethoprim] Diarrhea  . Telithromycin Nausea Only  . Zanaflex [Tizanidine] Other (See Comments)    Caused the patient to feel "spaced out" and "not well"  . Adhesive [Tape] Rash and Other (See Comments)    Paper works tape works fine   . Epinephrine Palpitations and Other (See Comments)    Also makes the heart race     (Not in a hospital admission)  No results found for this or any previous visit (from the past 48 hour(s)). No results found.  Review of Systems  Constitutional: Negative.   Eyes: Negative.   Respiratory: Negative.   Cardiovascular: Negative.   Gastrointestinal: Negative.   Genitourinary: Negative.   Musculoskeletal: Positive for neck pain.  Skin: Negative.   Neurological: Negative.   Psychiatric/Behavioral: Negative.     There were no vitals taken for this visit. Physical Exam  Constitutional: She is oriented to person, place, and time. She appears well-developed and well-nourished.  HENT:  Head: Normocephalic.  Eyes: Pupils are equal, round, and reactive to  light.  Neck:    Cardiovascular: Normal rate.  Respiratory: Effort normal.  GI: Soft. She exhibits no distension.  Neurological: She is alert and oriented to person, place, and time.  Skin: Skin is warm.  Psychiatric: She has a normal mood and affect. Her behavior is normal. Judgment and thought content normal.     Assessment/Plan Plan debridement with Dr. Ronnald Ramp and placement of Acell and VAC.  [place], DO 02/20/2017, 7:19 AM

## 2017-02-21 ENCOUNTER — Ambulatory Visit: Payer: Medicare Other | Admitting: Internal Medicine

## 2017-02-21 ENCOUNTER — Ambulatory Visit (HOSPITAL_COMMUNITY): Payer: Medicare Other | Admitting: Certified Registered"

## 2017-02-21 ENCOUNTER — Ambulatory Visit (HOSPITAL_COMMUNITY)
Admission: RE | Admit: 2017-02-21 | Discharge: 2017-02-21 | Disposition: A | Discharge status: Home / Self Care | Payer: Medicare Other | Source: Ambulatory Visit | Attending: Plastic Surgery | Admitting: Plastic Surgery

## 2017-02-21 ENCOUNTER — Encounter (HOSPITAL_COMMUNITY)
Admission: RE | Disposition: A | Discharge status: Home / Self Care | Payer: Self-pay | Source: Ambulatory Visit | Attending: Plastic Surgery

## 2017-02-21 ENCOUNTER — Encounter (HOSPITAL_COMMUNITY): Payer: Self-pay | Admitting: *Deleted

## 2017-02-21 DIAGNOSIS — Z888 Allergy status to other drugs, medicaments and biological substances status: Secondary | ICD-10-CM | POA: Diagnosis not present

## 2017-02-21 DIAGNOSIS — T8132XD Disruption of internal operation (surgical) wound, not elsewhere classified, subsequent encounter: Secondary | ICD-10-CM | POA: Diagnosis present

## 2017-02-21 DIAGNOSIS — Z930 Tracheostomy status: Secondary | ICD-10-CM | POA: Diagnosis not present

## 2017-02-21 DIAGNOSIS — S1190XA Unspecified open wound of unspecified part of neck, initial encounter: Secondary | ICD-10-CM

## 2017-02-21 DIAGNOSIS — J8410 Pulmonary fibrosis, unspecified: Secondary | ICD-10-CM | POA: Diagnosis not present

## 2017-02-21 DIAGNOSIS — J449 Chronic obstructive pulmonary disease, unspecified: Secondary | ICD-10-CM | POA: Insufficient documentation

## 2017-02-21 DIAGNOSIS — Y838 Other surgical procedures as the cause of abnormal reaction of the patient, or of later complication, without mention of misadventure at the time of the procedure: Secondary | ICD-10-CM | POA: Insufficient documentation

## 2017-02-21 DIAGNOSIS — K589 Irritable bowel syndrome without diarrhea: Secondary | ICD-10-CM | POA: Diagnosis not present

## 2017-02-21 DIAGNOSIS — Z886 Allergy status to analgesic agent status: Secondary | ICD-10-CM | POA: Insufficient documentation

## 2017-02-21 DIAGNOSIS — Z883 Allergy status to other anti-infective agents status: Secondary | ICD-10-CM | POA: Diagnosis not present

## 2017-02-21 DIAGNOSIS — K219 Gastro-esophageal reflux disease without esophagitis: Secondary | ICD-10-CM | POA: Insufficient documentation

## 2017-02-21 DIAGNOSIS — Z881 Allergy status to other antibiotic agents status: Secondary | ICD-10-CM | POA: Insufficient documentation

## 2017-02-21 DIAGNOSIS — Z8673 Personal history of transient ischemic attack (TIA), and cerebral infarction without residual deficits: Secondary | ICD-10-CM | POA: Diagnosis not present

## 2017-02-21 DIAGNOSIS — Z7902 Long term (current) use of antithrombotics/antiplatelets: Secondary | ICD-10-CM | POA: Insufficient documentation

## 2017-02-21 HISTORY — PX: INCISION AND DRAINAGE OF WOUND: SHX1803

## 2017-02-21 HISTORY — DX: Encounter for adjustment and management of vascular access device: Z45.2

## 2017-02-21 HISTORY — PX: APPLICATION OF A-CELL OF HEAD/NECK: SHX6304

## 2017-02-21 HISTORY — DX: Nausea with vomiting, unspecified: Z98.890

## 2017-02-21 HISTORY — DX: Other specified postprocedural states: Z98.890

## 2017-02-21 HISTORY — DX: Nausea with vomiting, unspecified: R11.2

## 2017-02-21 SURGERY — IRRIGATION AND DEBRIDEMENT WOUND
Anesthesia: General | Site: Neck

## 2017-02-21 MED ORDER — ROCURONIUM BROMIDE 100 MG/10ML IV SOLN
INTRAVENOUS | Status: DC | PRN
  Administered 2017-02-21: 20 mg via INTRAVENOUS

## 2017-02-21 MED ORDER — ONDANSETRON HCL 4 MG/2ML IJ SOLN
INTRAMUSCULAR | Status: AC
  Filled 2017-02-21: qty 6

## 2017-02-21 MED ORDER — DOUBLE ANTIBIOTIC 500-10000 UNIT/GM EX OINT
TOPICAL_OINTMENT | CUTANEOUS | Status: AC
  Filled 2017-02-21: qty 1

## 2017-02-21 MED ORDER — FENTANYL CITRATE (PF) 100 MCG/2ML IJ SOLN
25.0000 ug | INTRAMUSCULAR | Status: DC | PRN
  Administered 2017-02-21: 25 ug via INTRAVENOUS
  Administered 2017-02-21: 50 ug via INTRAVENOUS
  Administered 2017-02-21: 25 ug via INTRAVENOUS

## 2017-02-21 MED ORDER — 0.9 % SODIUM CHLORIDE (POUR BTL) OPTIME
TOPICAL | Status: DC | PRN
  Administered 2017-02-21: 1000 mL

## 2017-02-21 MED ORDER — EPHEDRINE SULFATE 50 MG/ML IJ SOLN
INTRAMUSCULAR | Status: DC | PRN
  Administered 2017-02-21: 10 mg via INTRAVENOUS

## 2017-02-21 MED ORDER — DEXAMETHASONE SODIUM PHOSPHATE 10 MG/ML IJ SOLN
INTRAMUSCULAR | Status: DC | PRN
  Administered 2017-02-21: 5 mg via INTRAVENOUS

## 2017-02-21 MED ORDER — FENTANYL CITRATE (PF) 250 MCG/5ML IJ SOLN
INTRAMUSCULAR | Status: AC
  Filled 2017-02-21: qty 5

## 2017-02-21 MED ORDER — BACITRACIN ZINC 500 UNIT/GM EX OINT
TOPICAL_OINTMENT | CUTANEOUS | Status: AC
  Filled 2017-02-21: qty 28.35

## 2017-02-21 MED ORDER — SUGAMMADEX SODIUM 200 MG/2ML IV SOLN
INTRAVENOUS | Status: DC | PRN
  Administered 2017-02-21: 100 mg via INTRAVENOUS

## 2017-02-21 MED ORDER — LACTATED RINGERS IV SOLN
INTRAVENOUS | Status: DC | PRN
  Administered 2017-02-21: 07:00:00 via INTRAVENOUS

## 2017-02-21 MED ORDER — SUGAMMADEX SODIUM 200 MG/2ML IV SOLN
INTRAVENOUS | Status: AC
  Filled 2017-02-21: qty 2

## 2017-02-21 MED ORDER — DEXAMETHASONE SODIUM PHOSPHATE 10 MG/ML IJ SOLN
INTRAMUSCULAR | Status: AC
  Filled 2017-02-21: qty 2

## 2017-02-21 MED ORDER — PROPOFOL 10 MG/ML IV BOLUS
INTRAVENOUS | Status: AC
  Filled 2017-02-21: qty 40

## 2017-02-21 MED ORDER — PROPOFOL 10 MG/ML IV BOLUS
INTRAVENOUS | Status: DC | PRN
  Administered 2017-02-21: 120 mg via INTRAVENOUS
  Administered 2017-02-21: 40 mg via INTRAVENOUS

## 2017-02-21 MED ORDER — SODIUM CHLORIDE 0.9 % IR SOLN
Status: DC | PRN
  Administered 2017-02-21: 500 mL

## 2017-02-21 MED ORDER — MIDAZOLAM HCL 2 MG/2ML IJ SOLN
INTRAMUSCULAR | Status: AC
  Filled 2017-02-21: qty 2

## 2017-02-21 MED ORDER — ROCURONIUM BROMIDE 10 MG/ML (PF) SYRINGE
PREFILLED_SYRINGE | INTRAVENOUS | Status: AC
  Filled 2017-02-21: qty 10

## 2017-02-21 MED ORDER — PROMETHAZINE HCL 25 MG/ML IJ SOLN: 6.2500 mg | INTRAMUSCULAR | Status: DC | PRN

## 2017-02-21 MED ORDER — PHENYLEPHRINE HCL 10 MG/ML IJ SOLN
INTRAMUSCULAR | Status: DC | PRN
  Administered 2017-02-21: 80 ug via INTRAVENOUS

## 2017-02-21 MED ORDER — FENTANYL CITRATE (PF) 100 MCG/2ML IJ SOLN
INTRAMUSCULAR | Status: DC | PRN
  Administered 2017-02-21: 25 ug via INTRAVENOUS

## 2017-02-21 MED ORDER — CEFAZOLIN SODIUM-DEXTROSE 2-4 GM/100ML-% IV SOLN
INTRAVENOUS | Status: AC
  Filled 2017-02-21: qty 100

## 2017-02-21 MED ORDER — EPHEDRINE 5 MG/ML INJ
INTRAVENOUS | Status: AC
  Filled 2017-02-21: qty 10

## 2017-02-21 MED ORDER — ONDANSETRON HCL 4 MG/2ML IJ SOLN
INTRAMUSCULAR | Status: DC | PRN
  Administered 2017-02-21: 4 mg via INTRAVENOUS

## 2017-02-21 MED ORDER — FENTANYL CITRATE (PF) 100 MCG/2ML IJ SOLN
INTRAMUSCULAR | Status: AC
  Administered 2017-02-21: 25 ug via INTRAVENOUS
  Filled 2017-02-21: qty 2

## 2017-02-21 MED ORDER — BUPIVACAINE-EPINEPHRINE (PF) 0.25% -1:200000 IJ SOLN
INTRAMUSCULAR | Status: AC
  Filled 2017-02-21: qty 30

## 2017-02-21 MED ORDER — PHENYLEPHRINE 40 MCG/ML (10ML) SYRINGE FOR IV PUSH (FOR BLOOD PRESSURE SUPPORT)
PREFILLED_SYRINGE | INTRAVENOUS | Status: AC
  Filled 2017-02-21: qty 20

## 2017-02-21 MED ORDER — CEFAZOLIN SODIUM-DEXTROSE 2-4 GM/100ML-% IV SOLN
2.0000 g | INTRAVENOUS | Status: AC
  Administered 2017-02-21: 2 g via INTRAVENOUS

## 2017-02-21 MED ORDER — SUCCINYLCHOLINE CHLORIDE 200 MG/10ML IV SOSY
PREFILLED_SYRINGE | INTRAVENOUS | Status: AC
  Filled 2017-02-21: qty 10

## 2017-02-21 MED ORDER — LIDOCAINE 2% (20 MG/ML) 5 ML SYRINGE
INTRAMUSCULAR | Status: AC
  Filled 2017-02-21: qty 10

## 2017-02-21 MED ORDER — FENTANYL CITRATE (PF) 100 MCG/2ML IJ SOLN
INTRAMUSCULAR | Status: AC
  Filled 2017-02-21: qty 2

## 2017-02-21 MED ORDER — LIDOCAINE HCL (CARDIAC) 20 MG/ML IV SOLN
INTRAVENOUS | Status: DC | PRN
  Administered 2017-02-21: 40 mg via INTRAVENOUS

## 2017-02-21 MED ORDER — LIDOCAINE-EPINEPHRINE 1 %-1:100000 IJ SOLN
INTRAMUSCULAR | Status: AC
  Filled 2017-02-21: qty 1

## 2017-02-21 SURGICAL SUPPLY — 71 items
APPLICATOR COTTON TIP 6IN STRL (MISCELLANEOUS) ×2 IMPLANT
BAG DECANTER FOR FLEXI CONT (MISCELLANEOUS) ×2 IMPLANT
BENZOIN TINCTURE PRP APPL 2/3 (GAUZE/BANDAGES/DRESSINGS) IMPLANT
BLADE 10 SAFETY STRL DISP (BLADE) ×2 IMPLANT
BNDG CONFORM 2 STRL LF (GAUZE/BANDAGES/DRESSINGS) IMPLANT
BNDG GAUZE ELAST 4 BULKY (GAUZE/BANDAGES/DRESSINGS) IMPLANT
BRUSH SCRUB EZ PLAIN DRY (MISCELLANEOUS) ×2 IMPLANT
CANISTER SUCT 3000ML PPV (MISCELLANEOUS) ×2 IMPLANT
CLEANER TIP ELECTROSURG 2X2 (MISCELLANEOUS) ×2 IMPLANT
CONT SPEC 4OZ CLIKSEAL STRL BL (MISCELLANEOUS) ×4 IMPLANT
COVER SURGICAL LIGHT HANDLE (MISCELLANEOUS) IMPLANT
DRAPE HALF SHEET 40X57 (DRAPES) IMPLANT
DRAPE IMP U-DRAPE 54X76 (DRAPES) ×2 IMPLANT
DRAPE INCISE IOBAN 66X45 STRL (DRAPES) IMPLANT
DRAPE LAPAROSCOPIC ABDOMINAL (DRAPES) IMPLANT
DRAPE LAPAROTOMY 100X72 PEDS (DRAPES) ×2 IMPLANT
DRESSING HYDROCOLLOID 4X4 (GAUZE/BANDAGES/DRESSINGS) IMPLANT
DRSG ADAPTIC 3X8 NADH LF (GAUZE/BANDAGES/DRESSINGS) IMPLANT
DRSG EMULSION OIL 3X3 NADH (GAUZE/BANDAGES/DRESSINGS) IMPLANT
DRSG PAD ABDOMINAL 8X10 ST (GAUZE/BANDAGES/DRESSINGS) IMPLANT
DRSG VAC ATS LRG SENSATRAC (GAUZE/BANDAGES/DRESSINGS) IMPLANT
DRSG VAC ATS MED SENSATRAC (GAUZE/BANDAGES/DRESSINGS) IMPLANT
DRSG VAC ATS SM SENSATRAC (GAUZE/BANDAGES/DRESSINGS) ×4 IMPLANT
ELECT CAUTERY BLADE 6.4 (BLADE) ×2 IMPLANT
ELECT COATED BLADE 2.86 ST (ELECTRODE) ×2 IMPLANT
ELECT NEEDLE TIP 2.8 STRL (NEEDLE) IMPLANT
ELECT REM PT RETURN 9FT ADLT (ELECTROSURGICAL) ×2
ELECTRODE REM PT RTRN 9FT ADLT (ELECTROSURGICAL) ×1 IMPLANT
GAUZE SPONGE 4X4 12PLY STRL (GAUZE/BANDAGES/DRESSINGS) IMPLANT
GAUZE SPONGE 4X4 16PLY XRAY LF (GAUZE/BANDAGES/DRESSINGS) ×4 IMPLANT
GLOVE BIO SURGEON STRL SZ 6.5 (GLOVE) IMPLANT
GLOVE SURG SS PI 6.5 STRL IVOR (GLOVE) ×6 IMPLANT
GLOVE SURG SS PI 8.0 STRL IVOR (GLOVE) ×2 IMPLANT
GOWN STRL REUS W/ TWL LRG LVL3 (GOWN DISPOSABLE) ×3 IMPLANT
GOWN STRL REUS W/ TWL XL LVL3 (GOWN DISPOSABLE) ×1 IMPLANT
GOWN STRL REUS W/TWL LRG LVL3 (GOWN DISPOSABLE) ×3
GOWN STRL REUS W/TWL XL LVL3 (GOWN DISPOSABLE) ×1
KIT BASIN OR (CUSTOM PROCEDURE TRAY) ×2 IMPLANT
KIT ROOM TURNOVER OR (KITS) ×2 IMPLANT
MICROMATRIX 1000MG (Tissue) ×2 IMPLANT
NEEDLE HYPO 25GX1X1/2 BEV (NEEDLE) ×2 IMPLANT
NS IRRIG 1000ML POUR BTL (IV SOLUTION) ×2 IMPLANT
PACK GENERAL/GYN (CUSTOM PROCEDURE TRAY) ×2 IMPLANT
PACK UNIVERSAL I (CUSTOM PROCEDURE TRAY) ×2 IMPLANT
PAD ARMBOARD 7.5X6 YLW CONV (MISCELLANEOUS) ×4 IMPLANT
PENCIL BUTTON HOLSTER BLD 10FT (ELECTRODE) ×2 IMPLANT
SOLUTION PARTIC MCRMTRX 1000MG (Tissue) ×1 IMPLANT
STAPLER VISISTAT 35W (STAPLE) IMPLANT
SURGILUBE 2OZ TUBE FLIPTOP (MISCELLANEOUS) IMPLANT
SUT CHROMIC 4 0 P 3 18 (SUTURE) IMPLANT
SUT ETHILON 4 0 PS 2 18 (SUTURE) IMPLANT
SUT ETHILON 5 0 P 3 18 (SUTURE)
SUT MNCRL AB 3-0 PS2 18 (SUTURE) ×2 IMPLANT
SUT MNCRL AB 4-0 PS2 18 (SUTURE) ×8 IMPLANT
SUT MON AB 3-0 SH 27 (SUTURE) ×3
SUT MON AB 3-0 SH27 (SUTURE) ×3 IMPLANT
SUT MON AB 5-0 PS2 18 (SUTURE) ×4 IMPLANT
SUT NYLON ETHILON 5-0 P-3 1X18 (SUTURE) IMPLANT
SUT SILK 4 0 (SUTURE)
SUT SILK 4-0 18XBRD TIE 12 (SUTURE) IMPLANT
SUT VIC AB 5-0 PS2 18 (SUTURE) IMPLANT
SWAB COLLECTION DEVICE MRSA (MISCELLANEOUS) IMPLANT
SWAB CULTURE ESWAB REG 1ML (MISCELLANEOUS) IMPLANT
SYR BULB IRRIGATION 50ML (SYRINGE) IMPLANT
SYR CONTROL 10ML LL (SYRINGE) ×2 IMPLANT
TOWEL OR 17X24 6PK STRL BLUE (TOWEL DISPOSABLE) ×2 IMPLANT
TOWEL OR 17X26 10 PK STRL BLUE (TOWEL DISPOSABLE) ×2 IMPLANT
TRAY ENT MC OR (CUSTOM PROCEDURE TRAY) ×2 IMPLANT
UNDERPAD 30X30 (UNDERPADS AND DIAPERS) ×2 IMPLANT
WATER STERILE IRR 1000ML POUR (IV SOLUTION) ×2 IMPLANT
YANKAUER SUCT BULB TIP NO VENT (SUCTIONS) ×2 IMPLANT

## 2017-02-21 NOTE — Op Note (Signed)
DATE OF OPERATION: 02/21/2017  LOCATION: [facility] main Operating Room outpatient  PREOPERATIVE DIAGNOSIS: neck wound  POSTOPERATIVE DIAGNOSIS: Same  PROCEDURE: Excision of muscle and fascia of neck wound 4 x 8 cm with muscle advancement, Acell (1 gm powder) placement and VAC application.  SURGEON: [place], DO  ASSISTANT: Sherley Bounds, MD  EBL: 5 cc  CONDITION: Stable  COMPLICATIONS: None  INDICATION: The patient, Anne [name], is a 66 y.o. 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   PROCEDURE DETAILS:  The patient was seen prior to surgery and marked.  The IV antibiotics were given. The patient was taken to the operating room and given a general anesthetic. A standard time out was performed and all information was confirmed by those in the room. SCDs were placed.   The patient was placed in the prone position.  She was prepped and draped in the usual sterile fashion.  The previous sutures were removed.  The wound was irrigated with antibiotic solution and saline.  The #10 blade was used to excise the nonviable skin edges of the 8 cm wound on both sides.  The bovie was then used to excise the fibrotic fascia and muscle at the wound bed for the 4 x 8 cm wound.  The wound was then irrigated with antibiotic solution and saline.  Hemostasis was achieved with electrocautery.  Dr. Ronnald Ramp then excised the spinous process.  The semispinalis capitus muscle was released from the superficial fascia and deep layer to provide a tension free closure over the bone exposed for the 8 cm wound length. The 3-0 Monocryl was used to close the muscle edges. The superficial fascia was then release with the bovie and approximated with the 4-0 Monocryl.   The Acell powder of 1 gm was placed between the layers for initiation of granulation tissue.  The skin was closed with the 5-0 Monocryl with 1 cm left open for use of the VAC.  The VAC was then  applied with an excellent seal.  The patient was allowed to wake up and taken to recovery room in stable condition at the end of the case. The family was notified at the end of the case.

## 2017-02-21 NOTE — Transfer of Care (Signed)
Immediate Anesthesia Transfer of Care Note  Patient: Anne [name]  Procedure(s) Performed: IRRIGATION AND DEBRIDEMENT WOUND NECK (N/A Neck) APPLICATION OF A-CELL OF HEAD/NECK (N/A Neck)  Patient Location: PACU  Anesthesia Type:General  Level of Consciousness: awake, alert , oriented and sedated  Airway & Oxygen Therapy: Patient Spontanous Breathing and Patient connected to face mask oxygen  Post-op Assessment: Report given to RN, Post -op Vital signs reviewed and stable and Patient moving all extremities  Post vital signs: Reviewed and stable  Last Vitals:  Vitals:   02/21/17 0624 02/21/17 0945  BP: (!) 123/47 (!) 123/54  Pulse: 74   Resp: 20   Temp: 36.6 C (!) 36.3 C  SpO2: 99%     Last Pain:  Vitals:   02/21/17 0624  TempSrc: Oral      Patients Stated Pain Goal: 8 (78/24/23 5361)  Complications: No apparent anesthesia complications

## 2017-02-21 NOTE — Progress Notes (Signed)
Wasted fentanyl 50 mcg in the sharps verified with Rex Kras, RN

## 2017-02-21 NOTE — Anesthesia Procedure Notes (Signed)
Procedure Name: Intubation Date/Time: 02/21/2017 7:50 AM Performed by: Scheryl Darter, CRNA Pre-anesthesia Checklist: Patient identified, Emergency Drugs available, Suction available and Patient being monitored Patient Re-evaluated:Patient Re-evaluated prior to induction Oxygen Delivery Method: Circle System Utilized Preoxygenation: Pre-oxygenation with 100% oxygen Induction Type: IV induction Ventilation: Mask ventilation without difficulty Tube type: Oral Tube size: 5.5 (re-inforced thru trach with ease) mm Number of attempts: 1 Airway Equipment and Method: Oral airway and Tracheostomy Placement Confirmation: ETT inserted through vocal cords under direct vision,  positive ETCO2 and breath sounds checked- equal and bilateral Tube secured with: Tape Dental Injury: Teeth and Oropharynx as per pre-operative assessment

## 2017-02-21 NOTE — Anesthesia Postprocedure Evaluation (Signed)
Anesthesia Post Note  Patient: Anne [name]  Procedure(s) Performed: IRRIGATION AND DEBRIDEMENT WOUND NECK (N/A Neck) APPLICATION OF A-CELL OF HEAD/NECK (N/A Neck)     Patient location during evaluation: PACU Anesthesia Type: General Level of consciousness: awake and alert Pain management: pain level controlled Vital Signs Assessment: post-procedure vital signs reviewed and stable Respiratory status: spontaneous breathing, nonlabored ventilation, respiratory function stable and patient connected to nasal cannula oxygen Cardiovascular status: blood pressure returned to baseline and stable Postop Assessment: no apparent nausea or vomiting Anesthetic complications: no    Last Vitals:  Vitals:   02/21/17 1028 02/21/17 1030  BP:    Pulse: 76 77  Resp: 17 (!) 24  Temp:    SpO2: 100% 100%    Last Pain:  Vitals:   02/21/17 1028  TempSrc:   PainSc: 2                  Tiajuana Amass

## 2017-02-21 NOTE — Interval H&P Note (Signed)
History and Physical Interval Note:  02/21/2017 7:26 AM  Anne [name]  has presented today for surgery, with the diagnosis of open wound of neck  The various methods of treatment have been discussed with the patient and family. After consideration of risks, benefits and other options for treatment, the patient has consented to  Procedure(s): IRRIGATION AND DEBRIDEMENT WOUND NECK (N/A) APPLICATION OF A-CELL OF HEAD/NECK (N/A) as a surgical intervention .  The patient's history has been reviewed, patient examined, no change in status, stable for surgery.  I have reviewed the patient's chart and labs.  Questions were answered to the patient's satisfaction.     Loel Lofty [name]

## 2017-02-22 ENCOUNTER — Encounter (HOSPITAL_COMMUNITY): Payer: Self-pay | Admitting: Plastic Surgery

## 2017-02-22 ENCOUNTER — Other Ambulatory Visit: Payer: Self-pay | Admitting: Pharmacist

## 2017-02-24 ENCOUNTER — Other Ambulatory Visit: Payer: Self-pay | Admitting: Family Medicine

## 2017-03-08 ENCOUNTER — Other Ambulatory Visit: Payer: Self-pay | Admitting: Internal Medicine

## 2017-03-08 ENCOUNTER — Telehealth: Payer: Self-pay | Admitting: Pulmonary Disease

## 2017-03-08 MED ORDER — IPRATROPIUM-ALBUTEROL 0.5-2.5 (3) MG/3ML IN SOLN: 3.0000 mL | RESPIRATORY_TRACT | 2 refills | Status: AC | PRN

## 2017-03-08 MED ORDER — PREDNISONE 10 MG (21) PO TBPK: ORAL_TABLET | ORAL | 0 refills | Status: DC

## 2017-03-08 NOTE — Telephone Encounter (Signed)
Had a recent neck surgery since DS seen last. More SOB, tachycardia with exertion, hoarseness, some congestion in chest, prod cough. Has been on ABX recently due to surgery and is asking if we can call in a Prednisone Taper pack. Please advise on message. I have sent in the duoneb to the pharmacy.

## 2017-03-08 NOTE — Telephone Encounter (Signed)
Pt informed. January f/u appt scheduled. Nothing further needed.

## 2017-03-08 NOTE — Telephone Encounter (Signed)
°*  STAT* If patient is at the pharmacy, call can be transferred to refill team.   1. Which medications need to be refilled? (please list name of each medication and dose if known) Nebulizer   2. Which pharmacy/location (including street and city if local pharmacy) is medication to be sent to? [facility]   3. Do they need a 30 day or 90 day supply? 90 day     Pt states the past couple days she not feeling well.  She was more SOB than normal  She took some of left over prednisone .5 of it She wasn't sure if we could also send just a taper of that to the pharmacy to help with it.   Please advise

## 2017-03-08 NOTE — Telephone Encounter (Signed)
Sent script for prednisone taper pak.

## 2017-03-15 ENCOUNTER — Other Ambulatory Visit: Payer: Self-pay | Admitting: Family Medicine

## 2017-03-16 NOTE — Telephone Encounter (Signed)
Last OV 12/13/16 Filed under historical patient confirmed she is taking 20 mg as needed but mostly once a day.  [facility]

## 2017-03-17 NOTE — Telephone Encounter (Signed)
Sent to pharmacy. Please make sure she is taking a potassium supplement the days she takes this. If not we need to start her on one. Thanks.

## 2017-03-19 ENCOUNTER — Ambulatory Visit (INDEPENDENT_AMBULATORY_CARE_PROVIDER_SITE_OTHER): Payer: Medicare Other | Admitting: Family Medicine

## 2017-03-19 ENCOUNTER — Other Ambulatory Visit: Payer: Self-pay

## 2017-03-19 ENCOUNTER — Encounter: Payer: Self-pay | Admitting: Family Medicine

## 2017-03-19 VITALS — BP 110/60 | HR 72 | Temp 97.8°F | Wt 105.4 lb

## 2017-03-19 DIAGNOSIS — N289 Disorder of kidney and ureter, unspecified: Secondary | ICD-10-CM | POA: Diagnosis not present

## 2017-03-19 DIAGNOSIS — Z1231 Encounter for screening mammogram for malignant neoplasm of breast: Secondary | ICD-10-CM | POA: Diagnosis not present

## 2017-03-19 DIAGNOSIS — J4521 Mild intermittent asthma with (acute) exacerbation: Secondary | ICD-10-CM

## 2017-03-19 DIAGNOSIS — Z1239 Encounter for other screening for malignant neoplasm of breast: Secondary | ICD-10-CM

## 2017-03-19 DIAGNOSIS — Z79890 Hormone replacement therapy (postmenopausal): Secondary | ICD-10-CM | POA: Diagnosis not present

## 2017-03-19 DIAGNOSIS — Z981 Arthrodesis status: Secondary | ICD-10-CM | POA: Diagnosis not present

## 2017-03-19 NOTE — Telephone Encounter (Signed)
Patient will need to start on 15 mEq of liquid potassium daily with her Lasix.  Please inform her of this.  Thanks.

## 2017-03-19 NOTE — Assessment & Plan Note (Signed)
Appears to be doing better following wound dehiscence.  She will continue to monitor the wound and if she develops any complications she will contact her surgeon or be reevaluated.

## 2017-03-19 NOTE — Assessment & Plan Note (Signed)
Relatively stable.  She has had slight increased use of her albuterol recently but reports breathing has been stable.  She was on prednisone previously.  No wheezing noted today.  Discussed I would be hesitant to prescribe prednisone at this time given her issues with healing and lack of significant change from baseline.  She will monitor and continue albuterol as needed.  Given return precautions.

## 2017-03-19 NOTE — Assessment & Plan Note (Signed)
Encouraged her to keep her appointment with urology.

## 2017-03-19 NOTE — Telephone Encounter (Signed)
Patient states she takes liquid potassium some days and takes lasix every day.

## 2017-03-19 NOTE — Progress Notes (Signed)
Tommi Rumps, MD Phone: [phone]  Anne [name] is a 66 y.o. female who presents today for follow-up.  Patient recently underwent cervical spine surgery and had wound dehiscence and subsequently was treated for wound infection though she reports she had no infection.  She underwent surgery again with plastic surgery and neurosurgery to have the wound closed.  She notes it has been healing well since then.  She has followed up with plastic surgery.  She does have some pain and has been trying Robaxin though this is of little benefit.  Tramadol in the past has not helped.  She is not taking much oxycodone either.  She does note some deconditioning since her surgery and has had slight trouble with her asthma that responds to albuterol.  She notes her breathing overall is stable.  She notes no chest pain.  Chronic bilateral ankle swelling.  No unilateral leg swelling or pain.  Renal lesion: She has followed with urology for this.  She notes she has an MRI scheduled next month as well as follow-up with urology.  Patient reports she has been on estradiol for years.  She was previously on Premarin.  She has tried to come off of these medications in the past though had significant hot flashes that made it difficult for her to sleep and made it very difficult for her to function.  She is asymptomatic on this.  Social History   Tobacco Use  Smoking Status Never Smoker  Smokeless Tobacco Never Used     ROS see history of present illness  Objective  Physical Exam Vitals:   03/19/17 1037  BP: 110/60  Pulse: 72  Temp: 97.8 F (36.6 C)  SpO2: 99%    BP Readings from Last 3 Encounters:  03/19/17 110/60  02/21/17 (!) 137/51  02/10/17 102/78   Wt Readings from Last 3 Encounters:  03/19/17 105 lb 6.4 oz (47.8 kg)  02/08/17 108 lb 8 oz (49.2 kg)  01/10/17 105 lb 11 oz (47.9 kg)    Physical Exam  Constitutional: No distress.  Cardiovascular: Normal rate, regular rhythm and normal  heart sounds.  Pulmonary/Chest: Effort normal. No respiratory distress. She has no wheezes. She has no rales.  Upper respiratory sounds noted  Musculoskeletal: She exhibits no edema.  Cervical spine wound appears to be well-healing.  There is no swelling, warmth, or erythema.  Neurological: She is alert. Gait normal.  Skin: Skin is warm and dry. She is not diaphoretic.     Assessment/Plan: Please see individual problem list.  Asthma Relatively stable.  She has had slight increased use of her albuterol recently but reports breathing has been stable.  She was on prednisone previously.  No wheezing noted today.  Discussed I would be hesitant to prescribe prednisone at this time given her issues with healing and lack of significant change from baseline.  She will monitor and continue albuterol as needed.  Given return precautions.  Kidney lesion Encouraged her to keep her appointment with urology.  S/P cervical spinal fusion Appears to be doing better following wound dehiscence.  She will continue to monitor the wound and if she develops any complications she will contact her surgeon or be reevaluated.  Current long-term use of postmenopausal hormone replacement therapy Discussed potential risks of being on this medication.  Patient is now aware of these and opts to continue the medication given significant benefit for her.   [facility] was seen today for follow-up.  Diagnoses and all orders for this visit:  Breast cancer screening -     MM SCREENING BREAST TOMO BILATERAL; Future  Intermittent asthma without acute exacerbation, unspecified asthma severity  Kidney lesion  S/P cervical spinal fusion  Current long-term use of postmenopausal hormone replacement therapy    Orders Placed This Encounter  Procedures  . MM SCREENING BREAST TOMO BILATERAL    Standing Status:   Future    Standing Expiration Date:   05/20/2018    Order Specific Question:   Reason for Exam (SYMPTOM  OR  DIAGNOSIS REQUIRED)    Answer:   breast cancer screening    Order Specific Question:   Preferred imaging location?    Answer:   [place]    No orders of the defined types were placed in this encounter.    Tommi Rumps, MD [place]

## 2017-03-19 NOTE — Assessment & Plan Note (Signed)
Discussed potential risks of being on this medication.  Patient is now aware of these and opts to continue the medication given significant benefit for her.

## 2017-03-19 NOTE — Patient Instructions (Signed)
Nice to see you. Please monitor your wound.  If you have any issues with it please contact your surgeon. Please try taking a dose of pain medication prior to going to bed as this may help you sleep better.  Please do not take the Robaxin at the same time as this may make you excessively drowsy. Please continue to use your albuterol.  If you have increasing issues with her asthma please let us know or be reevaluated.

## 2017-03-22 ENCOUNTER — Telehealth: Payer: Self-pay | Admitting: Family Medicine

## 2017-03-22 NOTE — Telephone Encounter (Signed)
Patient would like to see how she feels tomorrow, she states she has tried mucinex and it has never worked for her. She would like to know what else she could try OTC that would not interact with her medication.

## 2017-03-22 NOTE — Telephone Encounter (Signed)
Copied from [place]. Topic: Quick Communication - See Telephone Encounter >> Mar 22, 2017 10:32 AM Robina Ade, Helene Kelp D wrote: CRM for notification. See Telephone encounter for: 03/22/17. Patient called and wanted to talk to provider or CMA about having something called in for hear head cold she is having. Please call patient back, thanks.

## 2017-03-22 NOTE — Telephone Encounter (Signed)
Please find out what symptoms she is currently having.  Thanks.

## 2017-03-22 NOTE — Telephone Encounter (Signed)
Patient notified

## 2017-03-22 NOTE — Telephone Encounter (Signed)
Patient is c/o cough and congestion x Tuesday. Patient states she had bodyaches yesterday, no fevers. Patient states she is worried the congestion will move down to her chest if not treated

## 2017-03-22 NOTE — Telephone Encounter (Signed)
Patient stated she brought this up at appointment. Please advise.

## 2017-03-22 NOTE — Telephone Encounter (Signed)
Please advise 

## 2017-03-22 NOTE — Telephone Encounter (Signed)
It sounds as though she has a viral illness. Please see if her symptoms have slowly built up or if they suddenly came on. If sudden onset she may have the flu and we could consider treatment. If slow onset more likely common cold virus and supportive care is most beneficial. We could also re-evaluate her tomorrow to determine appropriate treatment and you could place her on the schedule tomorrow morning at 9:15. Thanks.

## 2017-03-23 ENCOUNTER — Other Ambulatory Visit: Payer: Self-pay | Admitting: Family Medicine

## 2017-03-23 NOTE — Telephone Encounter (Signed)
She could try adding Flonase if she is not already using this.  It appears that she has been on Singulair in the past.  Please see if she is taking that.  She could try adding Claritin over-the-counter as well.  Please see how she is feeling today.  Thanks.

## 2017-03-23 NOTE — Telephone Encounter (Signed)
Patient notified and states she is on singular and zyrtec, she did try robitussin last night and did have relief from that.informed patient per Dr.Sonnenberg she can try coricidin. Informed patient if she is not getting better to give [place] a call and if she has worsening symptoms she should be evaluated.

## 2017-03-28 ENCOUNTER — Encounter: Payer: Self-pay | Admitting: Family Medicine

## 2017-03-29 ENCOUNTER — Ambulatory Visit
Admission: RE | Admit: 2017-03-29 | Discharge: 2017-03-29 | Disposition: A | Discharge status: Home / Self Care | Payer: Medicare Other | Source: Ambulatory Visit | Attending: Urology | Admitting: Urology

## 2017-03-29 ENCOUNTER — Other Ambulatory Visit: Payer: Self-pay | Admitting: Family Medicine

## 2017-03-29 DIAGNOSIS — Z9049 Acquired absence of other specified parts of digestive tract: Secondary | ICD-10-CM | POA: Diagnosis not present

## 2017-03-29 DIAGNOSIS — N2889 Other specified disorders of kidney and ureter: Secondary | ICD-10-CM | POA: Insufficient documentation

## 2017-03-29 LAB — POCT I-STAT CREATININE: Creatinine, Ser: 1.2 mg/dL — ABNORMAL HIGH (ref 0.44–1.00)

## 2017-03-29 MED ORDER — GADOBENATE DIMEGLUMINE 529 MG/ML IV SOLN
9.0000 mL | Freq: Once | INTRAVENOUS | Status: AC | PRN
  Administered 2017-03-29: 9 mL via INTRAVENOUS

## 2017-03-30 NOTE — Telephone Encounter (Signed)
Last OV 03/19/17 last filled 01/31/17 30 2rf

## 2017-03-30 NOTE — Telephone Encounter (Signed)
faxed

## 2017-04-02 ENCOUNTER — Ambulatory Visit (INDEPENDENT_AMBULATORY_CARE_PROVIDER_SITE_OTHER): Payer: Medicare Other | Admitting: Pulmonary Disease

## 2017-04-02 ENCOUNTER — Encounter: Payer: Self-pay | Admitting: Pulmonary Disease

## 2017-04-02 VITALS — BP 108/62 | HR 85 | Ht 63.5 in | Wt 104.0 lb

## 2017-04-02 DIAGNOSIS — R5381 Other malaise: Secondary | ICD-10-CM | POA: Diagnosis not present

## 2017-04-02 DIAGNOSIS — J8410 Pulmonary fibrosis, unspecified: Secondary | ICD-10-CM | POA: Diagnosis not present

## 2017-04-02 DIAGNOSIS — Z930 Tracheostomy status: Secondary | ICD-10-CM

## 2017-04-02 DIAGNOSIS — J45909 Unspecified asthma, uncomplicated: Secondary | ICD-10-CM

## 2017-04-02 NOTE — Progress Notes (Signed)
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 No frank honeycombing. Findings have progressed since  2014. 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  INTERVAL HISTORY: Underwent cervical spine surgery 01/10/17.  Postop complications with nonhealing wound and required repeat surgery for wound closure.  Required PICC line placement for long-term antibiotics.  Now recovered.  SUBJ: Events as above.  She now reports increased exertional dyspnea.  She notes shortness of breath with simple ambulation.  Otherwise, no new complaints.  Denies CP, fever, purulent sputum, hemoptysis, LE edema and calf tenderness.   OBJ: Vitals:   04/02/17 0904 04/02/17 0907  BP:  108/62  Pulse:  85  SpO2:  99%  Weight: 47.2 kg (104 lb)   Height: 5' 3.5" (1.613 m)    WDWN, NAD HEENT WNL No JVD, LAN.  Well-healed surgical scar on posterior neck.  Trach site clean  Diminished breath sounds, no wheezes Reg, no M NABS No C/C/E   DATA: CXR 01/10/17: Chronic biapical scarring. No acute findings.  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

## 2017-04-02 NOTE — Patient Instructions (Signed)
Continue Singulair and albuterol as needed Referral made to pulmonary rehabilitation program Follow-up in 6 months or sooner as needed

## 2017-04-05 ENCOUNTER — Ambulatory Visit (INDEPENDENT_AMBULATORY_CARE_PROVIDER_SITE_OTHER): Payer: Medicare Other | Admitting: Urology

## 2017-04-05 ENCOUNTER — Encounter: Payer: Self-pay | Admitting: Urology

## 2017-04-05 VITALS — BP 117/70 | HR 96 | Ht 63.5 in | Wt 103.8 lb

## 2017-04-05 DIAGNOSIS — N2889 Other specified disorders of kidney and ureter: Secondary | ICD-10-CM | POA: Diagnosis not present

## 2017-04-05 NOTE — Progress Notes (Signed)
04/05/2017 4:09 PM   Anne [name] 02-03-1951 [id number]  Referring provider: [name], MD [address] [place], [place] [address]  Chief Complaint  Patient presents with  . Follow-up    MRI results    HPI: The patient is a 67 year old female with past metal history significant for CVA, COPD, and pulmonary fibrosis presents today for follow up of a 1.2 cm posterior upper left renal mass with possible enhancement. This was found incidentally on a CT scan performed for abdominal pain. It was also reimaged on MRI with similar findings. There is a possible slight enhancement but this is difficult to completely confirm due to the small size. The patient has no personal history of genitourinary malignancy. She voids well. No gross hematuria. The patient has never smoked.    She underwent repeat MRI in January 2019 which showed stable size of the left renal neoplasm.  She has been on active surveillance per American urological Association guidelines for small renal masses.  PMH: Past Medical History:  Diagnosis Date  . Allergic rhinitis   . Anemia   . Asthma   . Complication of anesthesia    Breathing problems upon waking up. 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  . Compressed cervical disc   . COPD (chronic obstructive pulmonary disease) ([place])   . CVA (cerebral infarction)   . Dyspnea   . Esophageal dysmotility   . Heart murmur    as child  . History of hiatal hernia   . IBS (irritable bowel syndrome)   . Migraine   . PICC (peripherally inserted central catheter) removal 02/20/2017  . PONV (postoperative nausea and vomiting)   . Problems with swallowing    intermittently  . Pulmonary fibrosis ([place])   . Shingles   . Stroke [facility])    slurred speech, drawn face, imaging normal, occurred twice, [facility]-"TIA" if antything" 02/20/17-no residual effects  . Tracheostomy in place [facility])   . Vocal cord paresis     Surgical  History: Past Surgical History:  Procedure Laterality Date  . ABDOMINAL HYSTERECTOMY    . APPLICATION OF A-CELL OF HEAD/NECK N/A 02/21/2017   Procedure: APPLICATION OF A-CELL OF HEAD/NECK;  Surgeon: Wallace Going, DO;  Location: [facility];  Service: Plastics;  Laterality: N/A;  . BOTOX INJECTION N/A 07/29/2013   Procedure: BOTOX INJECTION;  Surgeon: Jerene Bears, MD;  [facility];  Service: Gastroenterology;  Laterality: N/A;  . BOTOX INJECTION N/A 05/04/2015   Procedure: BOTOX INJECTION;  Surgeon: Jerene Bears, MD;  [facility];  Service: Gastroenterology;  Laterality: N/A;  . BREAST SURGERY Left 2002   bx of skin  . CHOLECYSTECTOMY    . COLONOSCOPY    . ESOPHAGEAL MANOMETRY N/A 12/16/2012   Procedure: ESOPHAGEAL MANOMETRY (EM);  Surgeon: Jerene Bears, MD;  [facility];  Service: Gastroenterology;  Laterality: N/A;  . ESOPHAGOGASTRODUODENOSCOPY (EGD) WITH PROPOFOL N/A 07/29/2013   Procedure: ESOPHAGOGASTRODUODENOSCOPY (EGD) WITH PROPOFOL;  Surgeon: Jerene Bears, MD;  [facility];  Service: Gastroenterology;  Laterality: N/A;  with botox injection  . ESOPHAGOGASTRODUODENOSCOPY (EGD) WITH PROPOFOL N/A 05/04/2015   Procedure: ESOPHAGOGASTRODUODENOSCOPY (EGD) WITH PROPOFOL;  Surgeon: Jerene Bears, MD;  [facility];  Service: Gastroenterology;  Laterality: N/A;  . EYE SURGERY     Catarct surgery 2014  . Eye Surgery AS Child Left   . INCISION AND DRAINAGE OF WOUND N/A 02/21/2017   Procedure: IRRIGATION AND DEBRIDEMENT WOUND NECK;  Surgeon: Wallace Going, DO;  Location: [place];  Service: Plastics;  Laterality: N/A;  . JEJUNOSTOMY FEEDING TUBE     x2 both failed. no longer has  . MULTIPLE TOOTH EXTRACTIONS     2 teeth removed  . POSTERIOR CERVICAL FUSION/FORAMINOTOMY N/A 01/10/2017   Procedure: LAMINECTOMY AND FORAMINOTOMY CERVICAL FOUR-CERVICAL FIVE, CERVICAL FIVE-SIX POSTERIOR CERVICAL INSTRUMENT FUSION CERVICAL THREE-CERVICAL  SEVEN,CERVICAL LAMINECTOMY CERVICAL THREE-CERVICAL SEVEN.;  Surgeon: Eustace Moore, MD;  Location: [place];  Service: Neurosurgery;  Laterality: N/A;  posterior  . TRACHEOSTOMY  1996   done at [facility], Dr. Kathyrn Sheriff  . TUBAL LIGATION    . VIDEO BRONCHOSCOPY Bilateral 11/20/2012   Procedure: VIDEO BRONCHOSCOPY WITH FLUORO;  Surgeon: Juanito Doom, MD;  [facility];  Service: Cardiopulmonary;  Laterality: Bilateral;    Home Medications:  Allergies as of 04/05/2017      Reactions   Asa [aspirin] Shortness Of Breath, Swelling, Other (See Comments)   Tongue swells   Bee Venom Anaphylaxis, Shortness Of Breath   Eggs Or Egg-derived Products Shortness Of Breath, Other (See Comments)   Patient CAN eat these; just cannot tolerate if contained within a flu vaccine   Influenza Vaccines Shortness Of Breath, Other (See Comments)   ONLY IF IT CONTAINS EGGS!!!!!   Shellfish Allergy Anaphylaxis   Quinolones Swelling, Other (See Comments)   Feet swell   Ciprofloxacin Swelling, Other (See Comments)   ALL MEDS ENDING IN -FLOXACIN MAKE THE FEET SWELL   Clarithromycin Nausea Only   Erythromycin Nausea Only   Levaquin [levofloxacin In D5w] Swelling, Other (See Comments)   Feet and legs ache and SWELL   Peanut-containing Drug Products Other (See Comments)   Wheezing (boiled or raw peanuts)   Septra [sulfamethoxazole-trimethoprim] Diarrhea   Telithromycin Nausea Only   Zanaflex [tizanidine] Other (See Comments)   Caused the patient to feel "spaced out" and "not well"   Adhesive [tape] Rash, Other (See Comments)   Paper works tape works fine    Epinephrine Palpitations, Other (See Comments)   Also makes the heart race      Medication List        Accurate as of 04/05/17  4:09 PM. Always use your most recent med list.          acetaminophen 500 MG tablet Commonly known as:  TYLENOL Take 500 mg by mouth daily as needed for moderate pain or headache.   albuterol 108 (90 Base) MCG/ACT  inhaler Commonly known as:  PROVENTIL HFA;VENTOLIN HFA Inhale 2 puffs into the lungs every 6 (six) hours as needed for wheezing or shortness of breath.   ALPRAZolam 0.25 MG tablet Commonly known as:  XANAX TAKE ONE-HALF TABLET BY MOUTH TWICE DAILY AS NEEDED   cetirizine 10 MG tablet Commonly known as:  ZYRTEC TAKE ONE TABLET BY MOUTH EVERY DAY   clopidogrel 75 MG tablet Commonly known as:  PLAVIX TAKE ONE TABLET BY MOUTH EVERY MORNING   EPINEPHrine 0.3 mg/0.3 mL Soaj injection Commonly known as:  EPIPEN 2-PAK Inject 0.3 mLs (0.3 mg total) into the muscle once.   estradiol 1 MG tablet Commonly known as:  ESTRACE TAKE ONE TABLET BY MOUTH EVERY DAY   fluticasone 50 MCG/ACT nasal spray Commonly known as:  FLONASE PLACE 2 SPRAYS INTO BOTH NOSTRILS DAILY.   furosemide 20 MG tablet Commonly known as:  LASIX TAKE ONE TABLET EVERY DAY AS NEEDED   hydroxypropyl methylcellulose / hypromellose 2.5 % ophthalmic solution Commonly known as:  ISOPTO TEARS /  GONIOVISC Place 1 drop into both eyes 3 (three) times daily as needed for dry eyes.   ipratropium-albuterol 0.5-2.5 (3) MG/3ML Soln Commonly known as:  DUONEB Take 3 mLs by nebulization every 4 (four) hours as needed. Dx 496   linaclotide 72 MCG capsule Commonly known as:  LINZESS Take 1 capsule (72 mcg total) by mouth daily before breakfast.   methocarbamol 500 MG tablet Commonly known as:  ROBAXIN Take 1 tablet (500 mg total) by mouth every 6 (six) hours as needed for muscle spasms.   montelukast 10 MG tablet Commonly known as:  SINGULAIR Take 10 mg by mouth at bedtime.   mupirocin ointment 2 % Commonly known as:  BACTROBAN Apply 1 application daily as needed topically (FOR TRACH SITE IRRITATION).   oxyCODONE 5 MG immediate release tablet Commonly known as:  Oxy IR/ROXICODONE Take 1 tablet (5 mg total) by mouth every 6 (six) hours as needed for breakthrough pain.   pantoprazole 40 MG tablet Commonly known as:   PROTONIX Take 1 tablet (40 mg total) by mouth daily.   potassium chloride 20 MEQ/15ML (10%) Soln Take 15 mLs (20 mEq total) by mouth daily as needed (cramps).   PROBIOTIC PO Take 1 capsule daily as needed by mouth (FOR G.I. DISTRESS).   SALONPAS PAIN RELIEF PATCH EX Apply 1-2 patches daily as needed topically (FOR SHOULDER PAIN).   sodium chloride 0.65 % Soln nasal spray Commonly known as:  OCEAN Place 1 spray into both nostrils every 4 (four) hours as needed for congestion.   traMADol 50 MG tablet Commonly known as:  ULTRAM 1/2-1 tid prn   zolmitriptan 5 MG disintegrating tablet Commonly known as:  ZOMIG-ZMT Take 1 tablet (5 mg total) by mouth daily as needed for migraine.       Allergies:  Allergies  Allergen Reactions  . Asa [Aspirin] Shortness Of Breath, Swelling and Other (See Comments)    Tongue swells  . Bee Venom Anaphylaxis and Shortness Of Breath  . Eggs Or Egg-Derived Products Shortness Of Breath and Other (See Comments)    Patient CAN eat these; just cannot tolerate if contained within a flu vaccine  . Influenza Vaccines Shortness Of Breath and Other (See Comments)    ONLY IF IT CONTAINS EGGS!!!!!  . Shellfish Allergy Anaphylaxis  . Quinolones Swelling and Other (See Comments)    Feet swell  . Ciprofloxacin Swelling and Other (See Comments)    ALL MEDS ENDING IN -FLOXACIN MAKE THE FEET SWELL  . Clarithromycin Nausea Only  . Erythromycin Nausea Only  . Levaquin [Levofloxacin In D5w] Swelling and Other (See Comments)    Feet and legs ache and SWELL  . Peanut-Containing Drug Products Other (See Comments)    Wheezing (boiled or raw peanuts)  . Septra [Sulfamethoxazole-Trimethoprim] Diarrhea  . Telithromycin Nausea Only  . Zanaflex [Tizanidine] Other (See Comments)    Caused the patient to feel "spaced out" and "not well"  . Adhesive [Tape] Rash and Other (See Comments)    Paper works tape works fine   . Epinephrine Palpitations and Other (See Comments)     Also makes the heart race    Family History: Family History  Problem Relation Age of Onset  . Asthma Cousin   . COPD Cousin   . Breast cancer Maternal Grandmother 60  . Cancer Maternal Grandmother        breast  . Asthma Father   . Cancer Father        lung  . Kidney  cancer Father   . Cancer Paternal Uncle        lung  . COPD Paternal Grandfather   . Breast cancer Maternal Aunt 46  . Cancer Maternal Aunt        breast  . Bladder Cancer Neg Hx     Social History:  reports that  has never smoked. she has never used smokeless tobacco. She reports that she does not drink alcohol or use drugs.  ROS: UROLOGY Frequent Urination?: No Hard to postpone urination?: Yes Burning/pain with urination?: No Get up at night to urinate?: No Leakage of urine?: Yes Urine stream starts and stops?: No Trouble starting stream?: No Do you have to strain to urinate?: No Blood in urine?: No Urinary tract infection?: No Sexually transmitted disease?: No Injury to kidneys or bladder?: No Painful intercourse?: No Weak stream?: No Currently pregnant?: No Vaginal bleeding?: No Last menstrual period?: n  Gastrointestinal Nausea?: No Vomiting?: No Indigestion/heartburn?: No Diarrhea?: No Constipation?: No  Constitutional Fever: No Night sweats?: No Weight loss?: Yes Fatigue?: Yes  Skin Skin rash/lesions?: No Itching?: No  Eyes Blurred vision?: No Double vision?: No  Ears/Nose/Throat Sore throat?: No Sinus problems?: No  Hematologic/Lymphatic Swollen glands?: No Easy bruising?: No  Cardiovascular Leg swelling?: No Chest pain?: No  Respiratory Cough?: No Shortness of breath?: No  Endocrine Excessive thirst?: No  Musculoskeletal Back pain?: Yes Joint pain?: Yes  Neurological Headaches?: No Dizziness?: No  Psychologic Depression?: No Anxiety?: No  Physical Exam: BP 117/70 (BP Location: Right Arm, Patient Position: Sitting, Cuff Size: Normal)   Pulse 96    Ht 5' 3.5" (1.613 m)   Wt 103 lb 12.8 oz (47.1 kg)   BMI 18.10 kg/m   Constitutional:  Alert and oriented, No acute distress. HEENT: [place] AT, moist mucus membranes.  Trachea midline, no masses. Cardiovascular: No clubbing, cyanosis, or edema. Respiratory: Normal respiratory effort, no increased work of breathing. GI: Abdomen is soft, nontender, nondistended, no abdominal masses GU: No CVA tenderness.  Skin: No rashes, bruises or suspicious lesions. Lymph: No cervical or inguinal adenopathy. Neurologic: Grossly intact, no focal deficits, moving all 4 extremities. Psychiatric: Normal mood and affect.  Laboratory Data: Lab Results  Component Value Date   WBC 5.3 02/08/2017   HGB 11.1 (L) 02/08/2017   HCT 34.1 (L) 02/08/2017   MCV 94.7 02/08/2017   PLT 270 02/08/2017    Lab Results  Component Value Date   CREATININE 1.20 (H) 03/29/2017    No results found for: PSA  No results found for: TESTOSTERONE  Lab Results  Component Value Date   HGBA1C 5.4 12/13/2016    Urinalysis    Component Value Date/Time   APPEARANCEUR Clear 10/19/2016 1046   GLUCOSEU Negative 10/19/2016 1046   BILIRUBINUR Negative 10/19/2016 1046   PROTEINUR Negative 10/19/2016 1046   UROBILINOGEN 0.2 12/15/2015 0957   NITRITE Negative 10/19/2016 1046   LEUKOCYTESUR 2+ (A) 10/19/2016 1046    Pertinent Imaging: MRI reviewed as above  Assessment & Plan:   1. 1.2 cm left upper pole renal mass Discussed with the patient that her lesion is relatively stable and advocated for her to continue active surveillance.  We will rotate between renal ultrasound and MRI every 6 months.  She will follow-up in 6 months with a renal ultrasound prior  Return in about 6 months (around 10/03/2017) for renal u/s prior.  Nickie Retort, MD  [facility] [address] [place], [place] [address] [phone]

## 2017-04-06 ENCOUNTER — Telehealth: Payer: Self-pay | Admitting: Pulmonary Disease

## 2017-04-06 DIAGNOSIS — J8410 Pulmonary fibrosis, unspecified: Secondary | ICD-10-CM

## 2017-04-06 NOTE — Telephone Encounter (Signed)
Referral for pulmonary rehab has been placed per 04/02/17 OV note.  Pt is aware and voiced her understanding. Nothing further is needed.

## 2017-04-06 NOTE — Telephone Encounter (Signed)
Patient called Pulm Rehab to set up an appt.  They have not received the referral yet.

## 2017-04-18 ENCOUNTER — Ambulatory Visit
Admission: RE | Admit: 2017-04-18 | Discharge: 2017-04-18 | Disposition: A | Discharge status: Home / Self Care | Payer: Medicare Other | Source: Ambulatory Visit | Attending: Family Medicine | Admitting: Family Medicine

## 2017-04-18 DIAGNOSIS — Z1239 Encounter for other screening for malignant neoplasm of breast: Secondary | ICD-10-CM

## 2017-04-18 DIAGNOSIS — Z1231 Encounter for screening mammogram for malignant neoplasm of breast: Secondary | ICD-10-CM | POA: Insufficient documentation

## 2017-04-23 ENCOUNTER — Other Ambulatory Visit: Payer: Self-pay

## 2017-04-23 ENCOUNTER — Encounter: Payer: Medicare Other | Attending: Pulmonary Disease

## 2017-04-23 VITALS — Ht 63.2 in | Wt 105.1 lb

## 2017-04-23 DIAGNOSIS — J449 Chronic obstructive pulmonary disease, unspecified: Secondary | ICD-10-CM | POA: Diagnosis not present

## 2017-04-23 DIAGNOSIS — J3800 Paralysis of vocal cords and larynx, unspecified: Secondary | ICD-10-CM | POA: Diagnosis not present

## 2017-04-23 DIAGNOSIS — Z930 Tracheostomy status: Secondary | ICD-10-CM | POA: Insufficient documentation

## 2017-04-23 DIAGNOSIS — J8410 Pulmonary fibrosis, unspecified: Secondary | ICD-10-CM | POA: Insufficient documentation

## 2017-04-23 DIAGNOSIS — Z79899 Other long term (current) drug therapy: Secondary | ICD-10-CM | POA: Diagnosis not present

## 2017-04-23 DIAGNOSIS — Z8673 Personal history of transient ischemic attack (TIA), and cerebral infarction without residual deficits: Secondary | ICD-10-CM | POA: Insufficient documentation

## 2017-04-23 NOTE — Patient Instructions (Signed)
Patient Instructions  Patient Details  Name: Anne [name] MRN: [id number] Date of Birth: 1950/04/30 Referring Provider:  Wilhelmina Mcardle, MD  Below are your personal goals for exercise, nutrition, and risk factors. Our goal is to help you stay on track towards obtaining and maintaining these goals. We will be discussing your progress on these goals with you throughout the program.  Initial Exercise Prescription: Initial Exercise Prescription - 04/23/17 1500      Date of Initial Exercise RX and Referring Provider   Date  04/23/17    Referring Provider  Merton Border MD      Treadmill   MPH  2.6    Grade  1    Minutes  15    METs  3.35      NuStep   Level  2    SPM  80    Minutes  15    METs  3      REL-XR   Level  1    Speed  50    Minutes  15    METs  3      Prescription Details   Frequency (times per week)  3    Duration  Progress to 45 minutes of aerobic exercise without signs/symptoms of physical distress      Intensity   THRR 40-80% of Max Heartrate  100-135    Ratings of Perceived Exertion  11-13    Perceived Dyspnea  0-4      Progression   Progression  Continue to progress workloads to maintain intensity without signs/symptoms of physical distress.      Resistance Training   Training Prescription  Yes    Weight  3 lbs    Reps  10-15       Exercise Goals: Frequency: Be able to perform aerobic exercise two to three times per week in program working toward 2-5 days per week of home exercise.  Intensity: Work with a perceived exertion of 11 (fairly light) - 15 (hard) while following your exercise prescription.  We will make changes to your prescription with you as you progress through the program.   Duration: Be able to do 30 to 45 minutes of continuous aerobic exercise in addition to a 5 minute warm-up and a 5 minute cool-down routine.   Nutrition Goals: Your personal nutrition goals will be established when you do your nutrition analysis with the  dietician.  The following are general nutrition guidelines to follow: Cholesterol < 200mg /day Sodium < 1500mg /day Fiber: Women over 50 yrs - 21 grams per day  Personal Goals: Personal Goals and Risk Factors at Admission - 04/23/17 1443      Core Components/Risk Factors/Patient Goals on Admission    Weight Management  Yes    Intervention  Weight Management: Develop a combined nutrition and exercise program designed to reach desired caloric intake, while maintaining appropriate intake of nutrient and fiber, sodium and fats, and appropriate energy expenditure required for the weight goal.;Weight Management/Obesity: Establish reasonable short term and long term weight goals.;Weight Management: Provide education and appropriate resources to help participant work on and attain dietary goals.    Admit Weight  105 lb (47.6 kg)    Goal Weight: Short Term  105 lb (47.6 kg)    Goal Weight: Long Term  105 lb (47.6 kg)    Expected Outcomes  Short Term: Continue to assess and modify interventions until short term weight is achieved;Long Term: Adherence to nutrition and physical activity/exercise program aimed toward  attainment of established weight goal;Weight Maintenance: Understanding of the daily nutrition guidelines, which includes 25-35% calories from fat, 7% or less cal from saturated fats, less than 200mg  cholesterol, less than 1.5gm of sodium, & 5 or more servings of fruits and vegetables daily    Improve shortness of breath with ADL's  Yes    Intervention  Provide education, individualized exercise plan and daily activity instruction to help decrease symptoms of SOB with activities of daily living.    Expected Outcomes  Short Term: Improve cardiorespiratory fitness to achieve a reduction of symptoms when performing ADLs       Tobacco Use Initial Evaluation: Social History   Tobacco Use  Smoking Status Never Smoker  Smokeless Tobacco Never Used    Exercise Goals and Review: Exercise Goals     Row Name 04/23/17 1542             Exercise Goals   Increase Physical Activity  Yes       Intervention  Provide advice, education, support and counseling about physical activity/exercise needs.;Develop an individualized exercise prescription for aerobic and resistive training based on initial evaluation findings, risk stratification, comorbidities and participant's personal goals.       Expected Outcomes  Short Term: Attend rehab on a regular basis to increase amount of physical activity.;Long Term: Add in home exercise to make exercise part of routine and to increase amount of physical activity.;Long Term: Exercising regularly at least 3-5 days a week.       Increase Strength and Stamina  Yes       Intervention  Provide advice, education, support and counseling about physical activity/exercise needs.;Develop an individualized exercise prescription for aerobic and resistive training based on initial evaluation findings, risk stratification, comorbidities and participant's personal goals.       Expected Outcomes  Short Term: Increase workloads from initial exercise prescription for resistance, speed, and METs.;Short Term: Perform resistance training exercises routinely during rehab and add in resistance training at home;Long Term: Improve cardiorespiratory fitness, muscular endurance and strength as measured by increased METs and functional capacity (6MWT)       Able to understand and use rate of perceived exertion (RPE) scale  Yes       Intervention  Provide education and explanation on how to use RPE scale       Expected Outcomes  Short Term: Able to use RPE daily in rehab to express subjective intensity level;Long Term:  Able to use RPE to guide intensity level when exercising independently       Able to understand and use Dyspnea scale  Yes       Intervention  Provide education and explanation on how to use Dyspnea scale       Expected Outcomes  Short Term: Able to use Dyspnea scale daily in  rehab to express subjective sense of shortness of breath during exertion;Long Term: Able to use Dyspnea scale to guide intensity level when exercising independently       Knowledge and understanding of Target Heart Rate Range (THRR)  Yes       Intervention  Provide education and explanation of THRR including how the numbers were predicted and where they are located for reference       Expected Outcomes  Short Term: Able to state/look up THRR;Short Term: Able to use daily as guideline for intensity in rehab;Long Term: Able to use THRR to govern intensity when exercising independently       Able to check pulse independently  Yes       Intervention  Provide education and demonstration on how to check pulse in carotid and radial arteries.;Review the importance of being able to check your own pulse for safety during independent exercise       Expected Outcomes  Short Term: Able to explain why pulse checking is important during independent exercise;Long Term: Able to check pulse independently and accurately       Understanding of Exercise Prescription  Yes       Intervention  Provide education, explanation, and written materials on patient's individual exercise prescription       Expected Outcomes  Short Term: Able to explain program exercise prescription;Long Term: Able to explain home exercise prescription to exercise independently          Copy of goals given to participant.

## 2017-04-23 NOTE — Progress Notes (Signed)
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  acetaminophen (TYLENOL) 500 MG tablet, Take 500 mg by mouth daily as needed for moderate pain or headache., Disp: , Rfl:  .  albuterol (PROVENTIL HFA;VENTOLIN HFA) 108 (90 Base) MCG/ACT inhaler, Inhale 2 puffs into the lungs every 6 (six) hours as needed for wheezing or shortness of breath., Disp: 1 Inhaler, Rfl: 5 .  ALPRAZolam (XANAX) 0.25 MG tablet, TAKE ONE-HALF TABLET BY MOUTH TWICE DAILY AS NEEDED, Disp: 30 tablet, Rfl: 1 .  cetirizine (ZYRTEC) 10 MG tablet, TAKE ONE TABLET BY MOUTH EVERY DAY (Patient taking differently: Take 10 mg by mouth in the evening), Disp: 30 tablet, Rfl: 5 .  clopidogrel (PLAVIX) 75 MG tablet, TAKE ONE TABLET BY MOUTH EVERY MORNING, Disp: 90 tablet, Rfl: 1 .  EPINEPHrine (EPIPEN 2-PAK) 0.3 mg/0.3 mL IJ SOAJ injection, Inject 0.3 mLs (0.3 mg total) into the muscle once. (Patient taking differently: Inject 0.3 mg once as needed into the muscle (FOR AN ALLERGIC REACTION). ), Disp: 1 Device, Rfl: 0 .  estradiol (ESTRACE) 1 MG tablet, TAKE ONE TABLET BY MOUTH EVERY DAY, Disp: 90 tablet, Rfl: 1 .  fluticasone (FLONASE) 50 MCG/ACT nasal spray, PLACE 2 SPRAYS INTO BOTH NOSTRILS DAILY. (Patient taking differently: Place 2 sprays into both nostrils daily as needed for allergies. ), Disp: 16 g, Rfl: 2 .  furosemide (LASIX) 20 MG tablet, TAKE ONE TABLET EVERY DAY AS NEEDED, Disp: 90 tablet, Rfl: 1 .  hydroxypropyl methylcellulose  (ISOPTO TEARS) 2.5 % ophthalmic solution, Place 1 drop into both eyes 3 (three) times daily as needed for dry eyes., Disp: , Rfl:  .  ipratropium-albuterol (DUONEB) 0.5-2.5 (3) MG/3ML SOLN, Take 3 mLs by nebulization every 4 (four) hours as needed. Dx 496 (Patient not taking: Reported on 04/05/2017), Disp: 120 mL, Rfl: 2 .  linaclotide (LINZESS) 72 MCG capsule, Take 1 capsule (72 mcg total) by mouth daily before breakfast. (Patient not taking: Reported on 04/05/2017), Disp: 30 capsule, Rfl: 3 .  Liniments (SALONPAS PAIN RELIEF PATCH EX), Apply 1-2 patches daily as needed topically (FOR SHOULDER PAIN). , Disp: , Rfl:  .  methocarbamol (ROBAXIN) 500 MG tablet, Take 1 tablet (500 mg total) by mouth every 6 (six) hours as needed for muscle spasms., Disp: 60 tablet, Rfl: 0 .  montelukast (SINGULAIR) 10 MG tablet, Take 10 mg by mouth at bedtime. , Disp: , Rfl:  .  mupirocin ointment (BACTROBAN) 2 %, Apply 1 application daily as needed topically (FOR [facility] SITE IRRITATION). , Disp: , Rfl:  .  oxyCODONE (OXY IR/ROXICODONE) 5 MG immediate release tablet, Take 1 tablet (5 mg total) by mouth every 6 (six) hours as needed for breakthrough pain., Disp: 30 tablet, Rfl: 0 .  pantoprazole (PROTONIX) 40 MG tablet, Take 1 tablet (40 mg total) by mouth daily., Disp: 90 tablet, Rfl: 0 .  potassium chloride 20 MEQ/15ML (10%) SOLN, Take 15 mLs (20 mEq total) by mouth daily as needed (cramps). (Patient taking differently:  Take 20 mEq daily as needed by mouth (for cramping). ), Disp: 240 mL, Rfl: 0 .  Probiotic Product (PROBIOTIC PO), Take 1 capsule daily as needed by mouth (FOR G.I. DISTRESS). , Disp: , Rfl:  .  sodium chloride (OCEAN) 0.65 % SOLN nasal spray, Place 1 spray into both nostrils every 4 (four) hours as needed for congestion. , Disp: , Rfl:  .  traMADol (ULTRAM) 50 MG tablet, 1/2-1 tid prn, Disp: , Rfl:  .  zolmitriptan (ZOMIG-ZMT) 5 MG disintegrating tablet, Take 1 tablet (5 mg total) by mouth daily as needed for  migraine. (Patient not taking: Reported on 04/05/2017), Disp: 10 tablet, Rfl: 0  Past Medical History: Past Medical History:  Diagnosis Date  . Allergic rhinitis   . Anemia   . Asthma   . Complication of anesthesia    Breathing problems upon waking up. 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  . Compressed cervical disc   . COPD (chronic obstructive pulmonary disease) ([place])   . CVA (cerebral infarction)   . Dyspnea   . Esophageal dysmotility   . Heart murmur    as child  . History of hiatal hernia   . IBS (irritable bowel syndrome)   . Migraine   . PICC (peripherally inserted central catheter) removal 02/20/2017  . PONV (postoperative nausea and vomiting)   . Problems with swallowing    intermittently  . Pulmonary fibrosis ([place])   . Shingles   . Stroke [facility])    slurred speech, drawn face, imaging normal, occurred twice, [facility]-"TIA" if antything" 02/20/17-no residual effects  . Tracheostomy in place [facility])   . Vocal cord paresis     Tobacco Use: Social History   Tobacco Use  Smoking Status Never Smoker  Smokeless Tobacco Never Used    Labs: Recent Review Flowsheet Data    Labs for ITP Cardiac and Pulmonary Rehab Latest Ref Rng & Units 12/13/2012 04/30/2013 12/13/2016   Cholestrol 0 - 200 mg/dL 201(H) - 215(H)   LDLCALC 0 - 99 mg/dL - - 64   LDLDIRECT mg/dL 92.7 - -   HDL >39.00 mg/dL 91.50 - 122.30   Trlycerides 0.0 - 149.0 mg/dL 101.0 - 140.0   Hemoglobin A1c 4.6 - 6.5 % - 5.9(H) 5.4       Pulmonary Assessment Scores: Pulmonary Assessment Scores    Row Name 04/23/17 1422         ADL UCSD   ADL Phase  Entry     SOB Score total  28     Rest  0     Walk  2     Stairs  4     Bath  0     Dress  0     Shop  1       CAT Score   CAT Score  14       mMRC Score   mMRC Score  2        Pulmonary Function Assessment: Pulmonary Function Assessment - 04/23/17 1426      Breath   Bilateral Breath Sounds  Clear;Decreased    Shortness  of Breath  Yes more on long walks she gets short of breath       Exercise Target Goals: Date: 04/23/17  Exercise Program Goal: Individual exercise prescription set using results from initial 6 min walk test and THRR while considering  patient's activity barriers and safety.    Exercise Prescription Goal: Initial exercise prescription builds to 30-45 minutes  a day of aerobic activity, 2-3 days per week.  Home exercise guidelines will be given to patient during program as part of exercise prescription that the participant will acknowledge.  Activity Barriers & Risk Stratification: Activity Barriers & Cardiac Risk Stratification - 04/23/17 1540      Activity Barriers & Cardiac Risk Stratification   Activity Barriers  Neck/Spine Problems;Deconditioning;Muscular Weakness;Shortness of Breath;Other (comment)    Comments  cervical spine fusions and hardwares (surgey x3), neuropathy in hands       6 Minute Walk: 6 Minute Walk    Row Name 04/23/17 1535         6 Minute Walk   Phase  Initial     Distance  1380 feet     Walk Time  6 minutes     # of Rest Breaks  0     MPH  2.61     METS  3.69     RPE  15     Perceived Dyspnea   3.5     VO2 Peak  12.9     Symptoms  Yes (comment)     Comments  wheezing with breathing and SOB     Resting HR  64 bpm     Resting BP  132/64     Resting Oxygen Saturation   98 %     Exercise Oxygen Saturation  during 6 min walk  95 %     Max Ex. HR  64 bpm     Max Ex. BP  138/64     2 Minute Post BP  126/60       Interval HR   1 Minute HR  96     2 Minute HR  102     3 Minute HR  103     4 Minute HR  99     5 Minute HR  104     6 Minute HR  102     2 Minute Post HR  75     Interval Heart Rate?  Yes       Interval Oxygen   Interval Oxygen?  Yes     Baseline Oxygen Saturation %  98 %     1 Minute Oxygen Saturation %  - equipment error     1 Minute Liters of Oxygen  0 L Room Air     2 Minute Oxygen Saturation %  98 %     2 Minute Liters of  Oxygen  0 L     3 Minute Oxygen Saturation %  97 %     3 Minute Liters of Oxygen  0 L     4 Minute Oxygen Saturation %  95 %     4 Minute Liters of Oxygen  0 L     5 Minute Oxygen Saturation %  100 %     5 Minute Liters of Oxygen  0 L     6 Minute Oxygen Saturation %  97 %     6 Minute Liters of Oxygen  0 L     2 Minute Post Oxygen Saturation %  100 %     2 Minute Post Liters of Oxygen  0 L       Oxygen Initial Assessment: Oxygen Initial Assessment - 04/23/17 1436      Home Oxygen   Home Oxygen Device  None    Sleep Oxygen Prescription  None    Home Exercise Oxygen Prescription  None  Home at Rest Exercise Oxygen Prescription  None      Initial 6 min Walk   Oxygen Used  None      Program Oxygen Prescription   Program Oxygen Prescription  None      Intervention   Short Term Goals  To learn and demonstrate proper pursed lip breathing techniques or other breathing techniques.;To learn and understand importance of monitoring SPO2 with pulse oximeter and demonstrate accurate use of the pulse oximeter.;To learn and demonstrate proper use of respiratory medications;To learn and understand importance of maintaining oxygen saturations>88% she uses her albuterol inhaler or nebulizer as needed.    Long  Term Goals  Verbalizes importance of monitoring SPO2 with pulse oximeter and return demonstration;Maintenance of O2 saturations>88%;Exhibits proper breathing techniques, such as pursed lip breathing or other method taught during program session;Compliance with respiratory medication;Demonstrates proper use of MDI's       Oxygen Re-Evaluation:   Oxygen Discharge (Final Oxygen Re-Evaluation):   Initial Exercise Prescription: Initial Exercise Prescription - 04/23/17 1500      Date of Initial Exercise RX and Referring Provider   Date  04/23/17    Referring Provider  Merton Border MD      Treadmill   MPH  2.6    Grade  1    Minutes  15    METs  3.35      NuStep   Level  2     SPM  80    Minutes  15    METs  3      REL-XR   Level  1    Speed  50    Minutes  15    METs  3      Prescription Details   Frequency (times per week)  3    Duration  Progress to 45 minutes of aerobic exercise without signs/symptoms of physical distress      Intensity   THRR 40-80% of Max Heartrate  100-135    Ratings of Perceived Exertion  11-13    Perceived Dyspnea  0-4      Progression   Progression  Continue to progress workloads to maintain intensity without signs/symptoms of physical distress.      Resistance Training   Training Prescription  Yes    Weight  3 lbs    Reps  10-15       Perform Capillary Blood Glucose checks as needed.  Exercise Prescription Changes: Exercise Prescription Changes    Row Name 04/23/17 1500             Response to Exercise   Blood Pressure (Admit)  132/64       Blood Pressure (Exercise)  138/64       Blood Pressure (Exit)  126/60       Heart Rate (Admit)  64 bpm       Heart Rate (Exercise)  104 bpm       Heart Rate (Exit)  75 bpm       Oxygen Saturation (Admit)  98 %       Oxygen Saturation (Exercise)  95 %       Oxygen Saturation (Exit)  100 %       Rating of Perceived Exertion (Exercise)  15       Perceived Dyspnea (Exercise)  3.5       Symptoms  wheezing with breathing, SOB       Comments  walk test results  Exercise Comments:   Exercise Goals and Review: Exercise Goals    Row Name 04/23/17 1542             Exercise Goals   Increase Physical Activity  Yes       Intervention  Provide advice, education, support and counseling about physical activity/exercise needs.;Develop an individualized exercise prescription for aerobic and resistive training based on initial evaluation findings, risk stratification, comorbidities and participant's personal goals.       Expected Outcomes  Short Term: Attend rehab on a regular basis to increase amount of physical activity.;Long Term: Add in home exercise to make  exercise part of routine and to increase amount of physical activity.;Long Term: Exercising regularly at least 3-5 days a week.       Increase Strength and Stamina  Yes       Intervention  Provide advice, education, support and counseling about physical activity/exercise needs.;Develop an individualized exercise prescription for aerobic and resistive training based on initial evaluation findings, risk stratification, comorbidities and participant's personal goals.       Expected Outcomes  Short Term: Increase workloads from initial exercise prescription for resistance, speed, and METs.;Short Term: Perform resistance training exercises routinely during rehab and add in resistance training at home;Long Term: Improve cardiorespiratory fitness, muscular endurance and strength as measured by increased METs and functional capacity (6MWT)       Able to understand and use rate of perceived exertion (RPE) scale  Yes       Intervention  Provide education and explanation on how to use RPE scale       Expected Outcomes  Short Term: Able to use RPE daily in rehab to express subjective intensity level;Long Term:  Able to use RPE to guide intensity level when exercising independently       Able to understand and use Dyspnea scale  Yes       Intervention  Provide education and explanation on how to use Dyspnea scale       Expected Outcomes  Short Term: Able to use Dyspnea scale daily in rehab to express subjective sense of shortness of breath during exertion;Long Term: Able to use Dyspnea scale to guide intensity level when exercising independently       Knowledge and understanding of Target Heart Rate Range (THRR)  Yes       Intervention  Provide education and explanation of THRR including how the numbers were predicted and where they are located for reference       Expected Outcomes  Short Term: Able to state/look up THRR;Short Term: Able to use daily as guideline for intensity in rehab;Long Term: Able to use THRR to  govern intensity when exercising independently       Able to check pulse independently  Yes       Intervention  Provide education and demonstration on how to check pulse in carotid and radial arteries.;Review the importance of being able to check your own pulse for safety during independent exercise       Expected Outcomes  Short Term: Able to explain why pulse checking is important during independent exercise;Long Term: Able to check pulse independently and accurately       Understanding of Exercise Prescription  Yes       Intervention  Provide education, explanation, and written materials on patient's individual exercise prescription       Expected Outcomes  Short Term: Able to explain program exercise prescription;Long Term: Able to explain home exercise prescription to  exercise independently          Exercise Goals Re-Evaluation :   Discharge Exercise Prescription (Final Exercise Prescription Changes): Exercise Prescription Changes - 04/23/17 1500      Response to Exercise   Blood Pressure (Admit)  132/64    Blood Pressure (Exercise)  138/64    Blood Pressure (Exit)  126/60    Heart Rate (Admit)  64 bpm    Heart Rate (Exercise)  104 bpm    Heart Rate (Exit)  75 bpm    Oxygen Saturation (Admit)  98 %    Oxygen Saturation (Exercise)  95 %    Oxygen Saturation (Exit)  100 %    Rating of Perceived Exertion (Exercise)  15    Perceived Dyspnea (Exercise)  3.5    Symptoms  wheezing with breathing, SOB    Comments  walk test results       Nutrition:  Target Goals: Understanding of nutrition guidelines, daily intake of sodium '1500mg'$ , cholesterol '200mg'$ , calories 30% from fat and 7% or less from saturated fats, daily to have 5 or more servings of fruits and vegetables.  Biometrics: Pre Biometrics - 04/23/17 1543      Pre Biometrics   Height  5' 3.2" (1.605 m)    Weight  105 lb 1.6 oz (47.7 kg)    Waist Circumference  24 inches    Hip Circumference  34.5 inches    Waist to Hip  Ratio  0.7 %    BMI (Calculated)  18.51        Nutrition Therapy Plan and Nutrition Goals: Nutrition Therapy & Goals - 04/23/17 1420      Personal Nutrition Goals   Comments  She cannot eat alot at one time. Some things are hard for her to swallow. She has learned how to eat over the years for her to be comfortable.      Intervention Plan   Intervention  Prescribe, educate and counsel regarding individualized specific dietary modifications aiming towards targeted core components such as weight, hypertension, lipid management, diabetes, heart failure and other comorbidities.;Nutrition handout(s) given to patient.    Expected Outcomes  Short Term Goal: Understand basic principles of dietary content, such as calories, fat, sodium, cholesterol and nutrients.;Long Term Goal: Adherence to prescribed nutrition plan.       Nutrition Assessments: Nutrition Assessments - 04/23/17 1441      MEDFICTS Scores   Pre Score  28       Nutrition Goals Re-Evaluation:   Nutrition Goals Discharge (Final Nutrition Goals Re-Evaluation):   Psychosocial: Target Goals: Acknowledge presence or absence of significant depression and/or stress, maximize coping skills, provide positive support system. Participant is able to verbalize types and ability to use techniques and skills needed for reducing stress and depression.   Initial Review & Psychosocial Screening: Initial Psych Review & Screening - 04/23/17 1418      Initial Review   Current issues with  None Identified      Family Dynamics   Good Support System?  Yes    Comments  She had a trach for 20 years and know what is normal with her breathing. Her husband and her 4 children are great for support.      Barriers   Psychosocial barriers to participate in program  There are no identifiable barriers or psychosocial needs.      Screening Interventions   Interventions  Encouraged to exercise;To provide support and resources with identified  psychosocial needs;Provide feedback about the scores  to participant    Expected Outcomes  Short Term goal: Utilizing psychosocial counselor, staff and physician to assist with identification of specific Stressors or current issues interfering with healing process. Setting desired goal for each stressor or current issue identified.;Long Term Goal: Stressors or current issues are controlled or eliminated.;Long Term goal: The participant improves quality of Life and PHQ9 Scores as seen by post scores and/or verbalization of changes;Short Term goal: Identification and review with participant of any Quality of Life or Depression concerns found by scoring the questionnaire.       Quality of Life Scores:  Scores of 19 and below usually indicate a poorer quality of life in these areas.  A difference of  2-3 points is a clinically meaningful difference.  A difference of 2-3 points in the total score of the Quality of Life Index has been associated with significant improvement in overall quality of life, self-image, physical symptoms, and general health in studies assessing change in quality of life.  PHQ-9: Recent Review Flowsheet Data    Depression screen [facility] 2/9 04/23/2017 12/13/2016 10/28/2015 11/20/2014   Decreased Interest 0 0 0 0   Down, Depressed, Hopeless 0 0 0 0   PHQ - 2 Score 0 0 0 0   Altered sleeping 3 - - -   Tired, decreased energy 1 - - -   Change in appetite 0 - - -   Feeling bad or failure about yourself  0 - - -   Trouble concentrating 0 - - -   Moving slowly or fidgety/restless 0 - - -   Suicidal thoughts 0 - - -   PHQ-9 Score 4 - - -   Difficult doing work/chores Not difficult at all - - -     Interpretation of Total Score  Total Score Depression Severity:  1-4 = Minimal depression, 5-9 = Mild depression, 10-14 = Moderate depression, 15-19 = Moderately severe depression, 20-27 = Severe depression   Psychosocial Evaluation and Intervention:   Psychosocial  Re-Evaluation:   Psychosocial Discharge (Final Psychosocial Re-Evaluation):   Education: Education Goals: Education classes will be provided on a weekly basis, covering required topics. Participant will state understanding/return demonstration of topics presented.  Learning Barriers/Preferences: Learning Barriers/Preferences - 04/23/17 1431      Learning Barriers/Preferences   Learning Barriers  Sight wears glasses    Learning Preferences  None       Education Topics:  Initial Evaluation Education: - Verbal, written and demonstration of respiratory meds, oximetry and breathing techniques. Instruction on use of nebulizers and MDIs and importance of monitoring MDI activations.   Pulmonary Rehab from 04/23/2017 in [facility] Cardiac and Pulmonary Rehab  Date  04/23/17  Educator  [facility]  Instruction Review Code  1- Verbalizes Understanding      General Nutrition Guidelines/Fats and Fiber: -Group instruction provided by verbal, written material, models and posters to present the general guidelines for heart healthy nutrition. Gives an explanation and review of dietary fats and fiber.   Controlling Sodium/Reading Food Labels: -Group verbal and written material supporting the discussion of sodium use in heart healthy nutrition. Review and explanation with models, verbal and written materials for utilization of the food label.   Exercise Physiology & General Exercise Guidelines: - Group verbal and written instruction with models to review the exercise physiology of the cardiovascular system and associated critical values. Provides general exercise guidelines with specific guidelines to those with heart or lung disease.    Aerobic Exercise & Resistance Training: - Gives  group verbal and written instruction on the various components of exercise. Focuses on aerobic and resistive training programs and the benefits of this training and how to safely progress through these  programs.   Flexibility, Balance, Mind/Body Relaxation: Provides group verbal/written instruction on the benefits of flexibility and balance training, including mind/body exercise modes such as yoga, pilates and tai chi.  Demonstration and skill practice provided.   Stress and Anxiety: - Provides group verbal and written instruction about the health risks of elevated stress and causes of high stress.  Discuss the correlation between heart/lung disease and anxiety and treatment options. Review healthy ways to manage with stress and anxiety.   Depression: - Provides group verbal and written instruction on the correlation between heart/lung disease and depressed mood, treatment options, and the stigmas associated with seeking treatment.   Exercise & Equipment Safety: - Individual verbal instruction and demonstration of equipment use and safety with use of the equipment.   Pulmonary Rehab from 04/23/2017 in [facility] Cardiac and Pulmonary Rehab  Date  04/23/17  Educator  [facility]  Instruction Review Code  1- Verbalizes Understanding      Infection Prevention: - Provides verbal and written material to individual with discussion of infection control including proper hand washing and proper equipment cleaning during exercise session.   Pulmonary Rehab from 04/23/2017 in [facility] Cardiac and Pulmonary Rehab  Date  04/23/17  Educator  [facility]  Instruction Review Code  1- Verbalizes Understanding      Falls Prevention: - Provides verbal and written material to individual with discussion of falls prevention and safety.   Pulmonary Rehab from 04/23/2017 in [facility] Cardiac and Pulmonary Rehab  Date  04/23/17  Educator  [facility]  Instruction Review Code  1- Verbalizes Understanding      Diabetes: - Individual verbal and written instruction to review signs/symptoms of diabetes, desired ranges of glucose level fasting, after meals and with exercise. Advice that pre and post exercise glucose checks will be done for 3  sessions at entry of program.   Chronic Lung Diseases: - Group verbal and written instruction to review updates, respiratory medications, advancements in procedures and treatments. Discuss use of supplemental oxygen including available portable oxygen systems, continuous and intermittent flow rates, concentrators, personal use and safety guidelines. Review proper use of inhaler and spacers. Provide informative websites for self-education.    Energy Conservation: - Provide group verbal and written instruction for methods to conserve energy, plan and organize activities. Instruct on pacing techniques, use of adaptive equipment and posture/positioning to relieve shortness of breath.   Triggers and Exacerbations: - Group verbal and written instruction to review types of environmental triggers and ways to prevent exacerbations. Discuss weather changes, air quality and the benefits of nasal washing. Review warning signs and symptoms to help prevent infections. Discuss techniques for effective airway clearance, coughing, and vibrations.   AED/CPR: - Group verbal and written instruction with the use of models to demonstrate the basic use of the AED with the basic ABC's of resuscitation.   Anatomy and Physiology of the Lungs: - Group verbal and written instruction with the use of models to provide basic lung anatomy and physiology related to function, structure and complications of lung disease.   Anatomy & Physiology of the Heart: - Group verbal and written instruction and models provide basic cardiac anatomy and physiology, with the coronary electrical and arterial systems. Review of Valvular disease and Heart Failure   Cardiac Medications: - Group verbal and written instruction to review commonly  prescribed medications for heart disease. Reviews the medication, class of the drug, and side effects.   Know Your Numbers and Risk Factors: -Group verbal and written instruction about important  numbers in your health.  Discussion of what are risk factors and how they play a role in the disease process.  Review of Cholesterol, Blood Pressure, Diabetes, and BMI and the role they play in your overall health.   Sleep Hygiene: -Provides group verbal and written instruction about how sleep can affect your health.  Define sleep hygiene, discuss sleep cycles and impact of sleep habits. Review good sleep hygiene tips.    Other: -Provides group and verbal instruction on various topics (see comments)    Knowledge Questionnaire Score: Knowledge Questionnaire Score - 04/23/17 1427      Knowledge Questionnaire Score   Pre Score  14/18 reviewed with patient        Core Components/Risk Factors/Patient Goals at Admission: Personal Goals and Risk Factors at Admission - 04/23/17 1443      Core Components/Risk Factors/Patient Goals on Admission    Weight Management  Yes    Intervention  Weight Management: Develop a combined nutrition and exercise program designed to reach desired caloric intake, while maintaining appropriate intake of nutrient and fiber, sodium and fats, and appropriate energy expenditure required for the weight goal.;Weight Management/Obesity: Establish reasonable short term and long term weight goals.;Weight Management: Provide education and appropriate resources to help participant work on and attain dietary goals.    Admit Weight  105 lb (47.6 kg)    Goal Weight: Short Term  105 lb (47.6 kg)    Goal Weight: Long Term  105 lb (47.6 kg)    Expected Outcomes  Short Term: Continue to assess and modify interventions until short term weight is achieved;Long Term: Adherence to nutrition and physical activity/exercise program aimed toward attainment of established weight goal;Weight Maintenance: Understanding of the daily nutrition guidelines, which includes 25-35% calories from fat, 7% or less cal from saturated fats, less than '200mg'$  cholesterol, less than 1.5gm of sodium, & 5 or  more servings of fruits and vegetables daily    Improve shortness of breath with ADL's  Yes    Intervention  Provide education, individualized exercise plan and daily activity instruction to help decrease symptoms of SOB with activities of daily living.    Expected Outcomes  Short Term: Improve cardiorespiratory fitness to achieve a reduction of symptoms when performing ADLs       Core Components/Risk Factors/Patient Goals Review:    Core Components/Risk Factors/Patient Goals at Discharge (Final Review):    ITP Comments: ITP Comments    Row Name 04/23/17 1403 04/23/17 1445         ITP Comments  Medical Evaluation completed. Chart sent for review and changes to Dr. Emily Filbert Director of [place]. Diagnosis can be found in [facility] encounter 04/23/17  patient is on Plavix to prevent stroke due to a hospital scare. She had slurred speech at one point but none of her test came back positive for a stroke.         Comments: Initial ITP

## 2017-04-24 ENCOUNTER — Other Ambulatory Visit: Payer: Self-pay | Admitting: Internal Medicine

## 2017-04-26 ENCOUNTER — Ambulatory Visit: Payer: Medicare Other

## 2017-04-27 ENCOUNTER — Encounter: Payer: Medicare Other | Attending: Pulmonary Disease

## 2017-04-27 DIAGNOSIS — Z930 Tracheostomy status: Secondary | ICD-10-CM | POA: Insufficient documentation

## 2017-04-27 DIAGNOSIS — J3800 Paralysis of vocal cords and larynx, unspecified: Secondary | ICD-10-CM | POA: Insufficient documentation

## 2017-04-27 DIAGNOSIS — Z8673 Personal history of transient ischemic attack (TIA), and cerebral infarction without residual deficits: Secondary | ICD-10-CM | POA: Insufficient documentation

## 2017-04-27 DIAGNOSIS — J449 Chronic obstructive pulmonary disease, unspecified: Secondary | ICD-10-CM | POA: Insufficient documentation

## 2017-04-27 DIAGNOSIS — J8410 Pulmonary fibrosis, unspecified: Secondary | ICD-10-CM | POA: Diagnosis present

## 2017-04-27 DIAGNOSIS — Z79899 Other long term (current) drug therapy: Secondary | ICD-10-CM | POA: Insufficient documentation

## 2017-04-27 NOTE — Progress Notes (Signed)
Daily Session Note  Patient Details  Name: Anne [name] MRN: [id number] Date of Birth: 04-23-1950 Referring Provider:     Pulmonary Rehab from 04/23/2017 in [facility] Cardiac and Pulmonary Rehab  Referring Provider  Merton Border MD      Encounter Date: 04/27/2017  Check In: Session Check In - 04/27/17 1013      Check-In   Location  [facility]-Cardiac & Pulmonary Rehab    Staff Present  Justin Mend Lindell Spar, BA, ACSM CEP, Exercise Physiologist;Meredith Sherryll Burger, RN BSN    Supervising physician immediately available to respond to emergencies  [facility] immediately available ER MD    Physician(s)  Dr. Burlene Arnt and Clearnce Hasten    Medication changes reported      No    Fall or balance concerns reported     No    Warm-up and Cool-down  Performed as group-led instruction    Resistance Training Performed  Yes    VAD Patient?  No      Pain Assessment   Currently in Pain?  No/denies          Social History   Tobacco Use  Smoking Status Never Smoker  Smokeless Tobacco Never Used    Goals Met:  Exercise tolerated well Personal goals reviewed Queuing for purse lip breathing No report of cardiac concerns or symptoms Strength training completed today  Goals Unmet:  Not Applicable  Comments: First full day of exercise!  Patient was oriented to gym and equipment including functions, settings, policies, and procedures.  Patient's individual exercise prescription and treatment plan were reviewed.  All starting workloads were established based on the results of the 6 minute walk test done at initial orientation visit.  The plan for exercise progression was also introduced and progression will be customized based on patient's performance and goals.   Dr. Emily Filbert is Medical Director for [place] and [facility].

## 2017-04-30 DIAGNOSIS — J8410 Pulmonary fibrosis, unspecified: Secondary | ICD-10-CM | POA: Diagnosis not present

## 2017-04-30 NOTE — Progress Notes (Signed)
Daily Session Note  Patient Details  Name: Anne [name] MRN: [id number] Date of Birth: 03-20-1951 Referring Provider:     Pulmonary Rehab from 04/23/2017 in [facility] Cardiac and Pulmonary Rehab  Referring Provider  Merton Border MD      Encounter Date: 04/30/2017  Check In: Session Check In - 04/30/17 1018      Check-In   Location  [facility]-Cardiac & Pulmonary Rehab    Staff Present  Nada Maclachlan, BA, ACSM CEP, Exercise Physiologist;Kelly Amedeo Plenty, BS, ACSM CEP, Exercise Physiologist;Samule Life Flavia Shipper    Supervising physician immediately available to respond to emergencies  [facility] immediately available ER MD    Physician(s)  Dr. Quentin Cornwall and Jimmye Norman    Medication changes reported      No    Fall or balance concerns reported     No    Warm-up and Cool-down  Performed as group-led instruction    Resistance Training Performed  Yes    VAD Patient?  No      Pain Assessment   Currently in Pain?  No/denies          Social History   Tobacco Use  Smoking Status Never Smoker  Smokeless Tobacco Never Used    Goals Met:  Independence with exercise equipment Exercise tolerated well No report of cardiac concerns or symptoms Strength training completed today  Goals Unmet:  Not Applicable  Comments: Pt able to follow exercise prescription today without complaint.  Will continue to monitor for progression.   Dr. Emily Filbert is Medical Director for [place] and [facility].

## 2017-05-02 DIAGNOSIS — J8410 Pulmonary fibrosis, unspecified: Secondary | ICD-10-CM

## 2017-05-02 NOTE — Progress Notes (Signed)
Daily Session Note  Patient Details  Name: JOHANNE MCGLADE MRN: [id number] Date of Birth: Sep 03, 1950 Referring Provider:     Pulmonary Rehab from 04/23/2017 in [facility] Cardiac and Pulmonary Rehab  Referring Provider  Merton Border MD      Encounter Date: 05/02/2017  Check In: Session Check In - 05/02/17 1020      Check-In   Location  [facility]-Cardiac & Pulmonary Rehab    Staff Present  Alberteen Sam, MA, RCEP, CCRP, Exercise Physiologist;Amanda Oletta Darter, BA, ACSM CEP, Exercise Physiologist;Jakorian Marengo Flavia Shipper    Supervising physician immediately available to respond to emergencies  [facility] immediately available ER MD    Physician(s)  Jimmye Norman and Lucita Lora    Medication changes reported      No    Fall or balance concerns reported     No    Warm-up and Cool-down  Performed as group-led Higher education careers adviser Performed  Yes    VAD Patient?  No      Pain Assessment   Currently in Pain?  No/denies    Multiple Pain Sites  No          Social History   Tobacco Use  Smoking Status Never Smoker  Smokeless Tobacco Never Used    Goals Met:  Independence with exercise equipment Exercise tolerated well No report of cardiac concerns or symptoms Strength training completed today  Goals Unmet:  Not Applicable  Comments: Reviewed home exercise with pt today.  Pt plans to walk and do dancercise class for exercise.  Reviewed THR, pulse, RPE, sign and symptoms, NTG use, and when to call 911 or MD.  Also discussed weather considerations and indoor options.  Pt voiced understanding.     Dr. Emily Filbert is Medical Director for [place] and [facility].

## 2017-05-04 ENCOUNTER — Telehealth: Payer: Self-pay | Admitting: Family Medicine

## 2017-05-04 DIAGNOSIS — J8410 Pulmonary fibrosis, unspecified: Secondary | ICD-10-CM

## 2017-05-04 NOTE — Telephone Encounter (Signed)
Copied from [place]. Topic: Inquiry >> May 04, 2017  1:06 PM Oliver Pila B wrote: Reason for CRM: pt called b/c she is having trouble staying asleep after her neck surgery, pt has tried all that she knows what she can do OTC(melatonin, tramadol). Pt would like a recommendation from pcp to see what she can do to get a good nights sleep, contact pt to advise

## 2017-05-04 NOTE — Progress Notes (Signed)
Daily Session Note  Patient Details  Name: Anne [name] MRN: [id number] Date of Birth: 02-27-1951 Referring Provider:     Pulmonary Rehab from 04/23/2017 in [facility] Cardiac and Pulmonary Rehab  Referring Provider  Merton Border MD      Encounter Date: 05/04/2017  Check In: Session Check In - 05/04/17 1014      Check-In   Location  [facility]-Cardiac & Pulmonary Rehab    Staff Present  Nada Maclachlan, BA, ACSM CEP, Exercise Physiologist;Meredith Sherryll Burger, RN BSN;Joseph Flavia Shipper    Supervising physician immediately available to respond to emergencies  [facility] immediately available ER MD    Physician(s)  Dr. Cinda Quest and Reita Cliche    Medication changes reported      No    Fall or balance concerns reported     No    Warm-up and Cool-down  Performed as group-led instruction    Resistance Training Performed  Yes    VAD Patient?  No      Pain Assessment   Currently in Pain?  No/denies          Social History   Tobacco Use  Smoking Status Never Smoker  Smokeless Tobacco Never Used    Goals Met:  Independence with exercise equipment Exercise tolerated well No report of cardiac concerns or symptoms Strength training completed today  Goals Unmet:  Not Applicable  Comments: Pt able to follow exercise prescription today without complaint.  Will continue to monitor for progression.   Dr. Emily Filbert is Medical Director for [place] and [facility].

## 2017-05-04 NOTE — Telephone Encounter (Signed)
Please advise 

## 2017-05-05 NOTE — Telephone Encounter (Signed)
Please find out what kind of symptoms she is having. Is it difficulty sleeping related to pain or some other cause? Thanks.

## 2017-05-07 DIAGNOSIS — J8410 Pulmonary fibrosis, unspecified: Secondary | ICD-10-CM

## 2017-05-07 NOTE — Telephone Encounter (Signed)
Patient notified and will check with her surgeon

## 2017-05-07 NOTE — Progress Notes (Signed)
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  acetaminophen (TYLENOL) 500 MG tablet, Take 500 mg by mouth daily as needed for moderate pain or headache., Disp: , Rfl:  .  albuterol (PROVENTIL HFA;VENTOLIN HFA) 108 (90 Base) MCG/ACT inhaler, Inhale 2 puffs into the lungs every 6 (six) hours as needed for wheezing or shortness of breath., Disp: 1 Inhaler, Rfl: 5 .  ALPRAZolam (XANAX) 0.25 MG tablet, TAKE ONE-HALF TABLET BY MOUTH TWICE DAILY AS NEEDED, Disp: 30 tablet, Rfl: 1 .  cetirizine (ZYRTEC) 10 MG tablet, TAKE ONE TABLET BY MOUTH EVERY DAY (Patient taking differently: Take 10 mg by mouth in the evening), Disp: 30 tablet, Rfl: 5 .  clopidogrel (PLAVIX) 75 MG tablet, TAKE ONE TABLET BY MOUTH EVERY MORNING, Disp: 90 tablet, Rfl: 1 .  EPINEPHrine (EPIPEN 2-PAK) 0.3 mg/0.3 mL IJ SOAJ injection, Inject 0.3 mLs (0.3 mg total) into the muscle once. (Patient taking differently: Inject 0.3 mg once as needed into the muscle (FOR AN ALLERGIC REACTION). ), Disp: 1 Device, Rfl: 0 .  estradiol (ESTRACE) 1 MG tablet, TAKE ONE TABLET BY MOUTH EVERY DAY, Disp: 90 tablet, Rfl: 1 .  fluticasone (FLONASE) 50 MCG/ACT nasal spray, PLACE 2 SPRAYS INTO BOTH NOSTRILS DAILY. (Patient taking differently: Place 2 sprays into both nostrils daily as needed for allergies. ), Disp: 16 g, Rfl: 2 .  furosemide (LASIX) 20 MG tablet, TAKE ONE TABLET EVERY DAY AS NEEDED, Disp: 90 tablet, Rfl: 1 .  hydroxypropyl methylcellulose  (ISOPTO TEARS) 2.5 % ophthalmic solution, Place 1 drop into both eyes 3 (three) times daily as needed for dry eyes., Disp: , Rfl:  .  ipratropium-albuterol (DUONEB) 0.5-2.5 (3) MG/3ML SOLN, Take 3 mLs by nebulization every 4 (four) hours as needed. Dx 496 (Patient not taking: Reported on 04/05/2017), Disp: 120 mL, Rfl: 2 .  linaclotide (LINZESS) 72 MCG capsule, Take 1 capsule (72 mcg total) by mouth daily before breakfast. (Patient not taking: Reported on 04/05/2017), Disp: 30 capsule, Rfl: 3 .  Liniments (SALONPAS PAIN RELIEF PATCH EX), Apply 1-2 patches daily as needed topically (FOR SHOULDER PAIN). , Disp: , Rfl:  .  methocarbamol (ROBAXIN) 500 MG tablet, Take 1 tablet (500 mg total) by mouth every 6 (six) hours as needed for muscle spasms., Disp: 60 tablet, Rfl: 0 .  montelukast (SINGULAIR) 10 MG tablet, Take 10 mg by mouth at bedtime. , Disp: , Rfl:  .  mupirocin ointment (BACTROBAN) 2 %, Apply 1 application daily as needed topically (FOR [facility] SITE IRRITATION). , Disp: , Rfl:  .  oxyCODONE (OXY IR/ROXICODONE) 5 MG immediate release tablet, Take 1 tablet (5 mg total) by mouth every 6 (six) hours as needed for breakthrough pain., Disp: 30 tablet, Rfl: 0 .  pantoprazole (PROTONIX) 40 MG tablet, Take 1 tablet (40 mg total) by mouth daily., Disp: 90 tablet, Rfl: 0 .  pantoprazole (PROTONIX) 40 MG tablet, TAKE ONE TABLET EVERY DAY, Disp: 90 tablet, Rfl: 1 .  potassium chloride 20 MEQ/15ML (  10%) SOLN, Take 15 mLs (20 mEq total) by mouth daily as needed (cramps). (Patient taking differently: Take 20 mEq daily as needed by mouth (for cramping). ), Disp: 240 mL, Rfl: 0 .  Probiotic Product (PROBIOTIC PO), Take 1 capsule daily as needed by mouth (FOR G.I. DISTRESS). , Disp: , Rfl:  .  sodium chloride (OCEAN) 0.65 % SOLN nasal spray, Place 1 spray into both nostrils every 4 (four) hours as needed for congestion. , Disp: , Rfl:  .  traMADol (ULTRAM) 50 MG tablet, 1/2-1 tid prn, Disp: , Rfl:  .  zolmitriptan  (ZOMIG-ZMT) 5 MG disintegrating tablet, Take 1 tablet (5 mg total) by mouth daily as needed for migraine. (Patient not taking: Reported on 04/05/2017), Disp: 10 tablet, Rfl: 0  Past Medical History: Past Medical History:  Diagnosis Date  . Allergic rhinitis   . Anemia   . Asthma   . Complication of anesthesia    Breathing problems upon waking up. 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  . Compressed cervical disc   . COPD (chronic obstructive pulmonary disease) ([place])   . CVA (cerebral infarction)   . Dyspnea   . Esophageal dysmotility   . Heart murmur    as child  . History of hiatal hernia   . IBS (irritable bowel syndrome)   . Migraine   . PICC (peripherally inserted central catheter) removal 02/20/2017  . PONV (postoperative nausea and vomiting)   . Problems with swallowing    intermittently  . Pulmonary fibrosis ([place])   . Shingles   . Stroke [facility])    slurred speech, drawn face, imaging normal, occurred twice, [facility]-"TIA" if antything" 02/20/17-no residual effects  . Tracheostomy in place [facility])   . Vocal cord paresis     Tobacco Use: Social History   Tobacco Use  Smoking Status Never Smoker  Smokeless Tobacco Never Used    Labs: Recent Review Flowsheet Data    Labs for ITP Cardiac and Pulmonary Rehab Latest Ref Rng & Units 12/13/2012 04/30/2013 12/13/2016   Cholestrol 0 - 200 mg/dL 201(H) - 215(H)   LDLCALC 0 - 99 mg/dL - - 64   LDLDIRECT mg/dL 92.7 - -   HDL >39.00 mg/dL 91.50 - 122.30   Trlycerides 0.0 - 149.0 mg/dL 101.0 - 140.0   Hemoglobin A1c 4.6 - 6.5 % - 5.9(H) 5.4       Pulmonary Assessment Scores: Pulmonary Assessment Scores    Row Name 04/23/17 1422         ADL UCSD   ADL Phase  Entry     SOB Score total  28     Rest  0     Walk  2     Stairs  4     Bath  0     Dress  0     Shop  1       CAT Score   CAT Score  14       mMRC Score   mMRC Score  2        Pulmonary Function Assessment: Pulmonary Function  Assessment - 04/23/17 1426      Breath   Bilateral Breath Sounds  Clear;Decreased    Shortness of Breath  Yes more on long walks she gets short of breath       Exercise Target Goals:    Exercise Program Goal: Individual exercise prescription set using results from initial 6 min walk test and THRR while considering  patient's  activity barriers and safety.    Exercise Prescription Goal: Initial exercise prescription builds to 30-45 minutes a day of aerobic activity, 2-3 days per week.  Home exercise guidelines will be given to patient during program as part of exercise prescription that the participant will acknowledge.  Activity Barriers & Risk Stratification: Activity Barriers & Cardiac Risk Stratification - 04/23/17 1540      Activity Barriers & Cardiac Risk Stratification   Activity Barriers  Neck/Spine Problems;Deconditioning;Muscular Weakness;Shortness of Breath;Other (comment)    Comments  cervical spine fusions and hardwares (surgey x3), neuropathy in hands       6 Minute Walk: 6 Minute Walk    Row Name 04/23/17 1535         6 Minute Walk   Phase  Initial     Distance  1380 feet     Walk Time  6 minutes     # of Rest Breaks  0     MPH  2.61     METS  3.69     RPE  15     Perceived Dyspnea   3.5     VO2 Peak  12.9     Symptoms  Yes (comment)     Comments  wheezing with breathing and SOB     Resting HR  64 bpm     Resting BP  132/64     Resting Oxygen Saturation   98 %     Exercise Oxygen Saturation  during 6 min walk  95 %     Max Ex. HR  64 bpm     Max Ex. BP  138/64     2 Minute Post BP  126/60       Interval HR   1 Minute HR  96     2 Minute HR  102     3 Minute HR  103     4 Minute HR  99     5 Minute HR  104     6 Minute HR  102     2 Minute Post HR  75     Interval Heart Rate?  Yes       Interval Oxygen   Interval Oxygen?  Yes     Baseline Oxygen Saturation %  98 %     1 Minute Oxygen Saturation %  - equipment error     1 Minute Liters of  Oxygen  0 L Room Air     2 Minute Oxygen Saturation %  98 %     2 Minute Liters of Oxygen  0 L     3 Minute Oxygen Saturation %  97 %     3 Minute Liters of Oxygen  0 L     4 Minute Oxygen Saturation %  95 %     4 Minute Liters of Oxygen  0 L     5 Minute Oxygen Saturation %  100 %     5 Minute Liters of Oxygen  0 L     6 Minute Oxygen Saturation %  97 %     6 Minute Liters of Oxygen  0 L     2 Minute Post Oxygen Saturation %  100 %     2 Minute Post Liters of Oxygen  0 L       Oxygen Initial Assessment: Oxygen Initial Assessment - 04/23/17 1436      Home Oxygen   Home Oxygen Device  None  Sleep Oxygen Prescription  None    Home Exercise Oxygen Prescription  None    Home at Rest Exercise Oxygen Prescription  None      Initial 6 min Walk   Oxygen Used  None      Program Oxygen Prescription   Program Oxygen Prescription  None      Intervention   Short Term Goals  To learn and demonstrate proper pursed lip breathing techniques or other breathing techniques.;To learn and understand importance of monitoring SPO2 with pulse oximeter and demonstrate accurate use of the pulse oximeter.;To learn and demonstrate proper use of respiratory medications;To learn and understand importance of maintaining oxygen saturations>88% she uses her albuterol inhaler or nebulizer as needed.    Long  Term Goals  Verbalizes importance of monitoring SPO2 with pulse oximeter and return demonstration;Maintenance of O2 saturations>88%;Exhibits proper breathing techniques, such as pursed lip breathing or other method taught during program session;Compliance with respiratory medication;Demonstrates proper use of MDI's       Oxygen Re-Evaluation: Oxygen Re-Evaluation    Row Name 04/27/17 1014             Goals/Expected Outcomes   Short Term Goals  To learn and demonstrate proper pursed lip breathing techniques or other breathing techniques.;To learn and understand importance of monitoring SPO2 with  pulse oximeter and demonstrate accurate use of the pulse oximeter.;To learn and demonstrate proper use of respiratory medications;To learn and understand importance of maintaining oxygen saturations>88%       Long  Term Goals  Verbalizes importance of monitoring SPO2 with pulse oximeter and return demonstration;Maintenance of O2 saturations>88%;Exhibits proper breathing techniques, such as pursed lip breathing or other method taught during program session;Compliance with respiratory medication;Demonstrates proper use of MDI's       Comments  Reviewed PLB technique with pt.  Talked about how it work and it's important to maintaining his exercise saturations.         Goals/Expected Outcomes  Short: Become more profiecient at using PLB.   Long: Become independent at using PLB.          Oxygen Discharge (Final Oxygen Re-Evaluation): Oxygen Re-Evaluation - 04/27/17 1014      Goals/Expected Outcomes   Short Term Goals  To learn and demonstrate proper pursed lip breathing techniques or other breathing techniques.;To learn and understand importance of monitoring SPO2 with pulse oximeter and demonstrate accurate use of the pulse oximeter.;To learn and demonstrate proper use of respiratory medications;To learn and understand importance of maintaining oxygen saturations>88%    Long  Term Goals  Verbalizes importance of monitoring SPO2 with pulse oximeter and return demonstration;Maintenance of O2 saturations>88%;Exhibits proper breathing techniques, such as pursed lip breathing or other method taught during program session;Compliance with respiratory medication;Demonstrates proper use of MDI's    Comments  Reviewed PLB technique with pt.  Talked about how it work and it's important to maintaining his exercise saturations.      Goals/Expected Outcomes  Short: Become more profiecient at using PLB.   Long: Become independent at using PLB.       Initial Exercise Prescription: Initial Exercise Prescription -  04/23/17 1500      Date of Initial Exercise RX and Referring Provider   Date  04/23/17    Referring Provider  Merton Border MD      Treadmill   MPH  2.6    Grade  1    Minutes  15    METs  3.35      NuStep  Level  2    SPM  80    Minutes  15    METs  3      REL-XR   Level  1    Speed  50    Minutes  15    METs  3      Prescription Details   Frequency (times per week)  3    Duration  Progress to 45 minutes of aerobic exercise without signs/symptoms of physical distress      Intensity   THRR 40-80% of Max Heartrate  100-135    Ratings of Perceived Exertion  11-13    Perceived Dyspnea  0-4      Progression   Progression  Continue to progress workloads to maintain intensity without signs/symptoms of physical distress.      Resistance Training   Training Prescription  Yes    Weight  3 lbs    Reps  10-15       Perform Capillary Blood Glucose checks as needed.  Exercise Prescription Changes: Exercise Prescription Changes    Row Name 04/23/17 1500 05/02/17 1100           Response to Exercise   Blood Pressure (Admit)  132/64  -      Blood Pressure (Exercise)  138/64  -      Blood Pressure (Exit)  126/60  -      Heart Rate (Admit)  64 bpm  -      Heart Rate (Exercise)  104 bpm  -      Heart Rate (Exit)  75 bpm  -      Oxygen Saturation (Admit)  98 %  -      Oxygen Saturation (Exercise)  95 %  -      Oxygen Saturation (Exit)  100 %  -      Rating of Perceived Exertion (Exercise)  15  -      Perceived Dyspnea (Exercise)  3.5  -      Symptoms  wheezing with breathing, SOB  -      Comments  walk test results  -        Home Exercise Plan   Plans to continue exercise at  -  [employer] (comment) [employer] and walk      Frequency  -  Add 2 additional days to program exercise sessions.      Initial Home Exercises Provided  -  05/02/17         Exercise Comments: Exercise Comments    Row Name 04/27/17 1013           Exercise Comments  First  full day of exercise!  Patient was oriented to gym and equipment including functions, settings, policies, and procedures.  Patient's individual exercise prescription and treatment plan were reviewed.  All starting workloads were established based on the results of the 6 minute walk test done at initial orientation visit.  The plan for exercise progression was also introduced and progression will be customized based on patient's performance and goals.          Exercise Goals and Review: Exercise Goals    Row Name 04/23/17 1542             Exercise Goals   Increase Physical Activity  Yes       Intervention  Provide advice, education, support and counseling about physical activity/exercise needs.;Develop an individualized exercise prescription for aerobic and resistive training based on initial evaluation findings,  risk stratification, comorbidities and participant's personal goals.       Expected Outcomes  Short Term: Attend rehab on a regular basis to increase amount of physical activity.;Long Term: Add in home exercise to make exercise part of routine and to increase amount of physical activity.;Long Term: Exercising regularly at least 3-5 days a week.       Increase Strength and Stamina  Yes       Intervention  Provide advice, education, support and counseling about physical activity/exercise needs.;Develop an individualized exercise prescription for aerobic and resistive training based on initial evaluation findings, risk stratification, comorbidities and participant's personal goals.       Expected Outcomes  Short Term: Increase workloads from initial exercise prescription for resistance, speed, and METs.;Short Term: Perform resistance training exercises routinely during rehab and add in resistance training at home;Long Term: Improve cardiorespiratory fitness, muscular endurance and strength as measured by increased METs and functional capacity (6MWT)       Able to understand and use rate of  perceived exertion (RPE) scale  Yes       Intervention  Provide education and explanation on how to use RPE scale       Expected Outcomes  Short Term: Able to use RPE daily in rehab to express subjective intensity level;Long Term:  Able to use RPE to guide intensity level when exercising independently       Able to understand and use Dyspnea scale  Yes       Intervention  Provide education and explanation on how to use Dyspnea scale       Expected Outcomes  Short Term: Able to use Dyspnea scale daily in rehab to express subjective sense of shortness of breath during exertion;Long Term: Able to use Dyspnea scale to guide intensity level when exercising independently       Knowledge and understanding of Target Heart Rate Range (THRR)  Yes       Intervention  Provide education and explanation of THRR including how the numbers were predicted and where they are located for reference       Expected Outcomes  Short Term: Able to state/look up THRR;Short Term: Able to use daily as guideline for intensity in rehab;Long Term: Able to use THRR to govern intensity when exercising independently       Able to check pulse independently  Yes       Intervention  Provide education and demonstration on how to check pulse in carotid and radial arteries.;Review the importance of being able to check your own pulse for safety during independent exercise       Expected Outcomes  Short Term: Able to explain why pulse checking is important during independent exercise;Long Term: Able to check pulse independently and accurately       Understanding of Exercise Prescription  Yes       Intervention  Provide education, explanation, and written materials on patient's individual exercise prescription       Expected Outcomes  Short Term: Able to explain program exercise prescription;Long Term: Able to explain home exercise prescription to exercise independently          Exercise Goals Re-Evaluation : Exercise Goals Re-Evaluation     Row Name 04/27/17 1014 05/02/17 1111           Exercise Goal Re-Evaluation   Exercise Goals Review  Understanding of Exercise Prescription;Able to understand and use Dyspnea scale;Knowledge and understanding of Target Heart Rate Range (THRR);Able to understand and use rate of  perceived exertion (RPE) scale  Increase Physical Activity;Increase Strength and Stamina;Able to understand and use rate of perceived exertion (RPE) scale;Able to check pulse independently;Knowledge and understanding of Target Heart Rate Range (THRR);Able to understand and use Dyspnea scale      Comments  Reviewed RPE scale, THR and program prescription with pt today.  Pt voiced understanding and was given a copy of goals to take home.   Reviewed home exercise - Pt attends dancercise classses and walk.  She also has clearance from her Dr for weights no more than 10-20 lb (neck surgery) She also walks at home      Expected Outcomes  Short: Use RPE daily to regulate intensity.  Long: Follow program prescription in THR.  Short - PT will continue dance class and walk on other day at home  Long - Pt will maintain exercise on her own         Discharge Exercise Prescription (Final Exercise Prescription Changes): Exercise Prescription Changes - 05/02/17 1100      Home Exercise Plan   Plans to continue exercise at  [facility] (comment) [place] and walk    Frequency  Add 2 additional days to program exercise sessions.    Initial Home Exercises Provided  05/02/17       Nutrition:  Target Goals: Understanding of nutrition guidelines, daily intake of sodium '1500mg'$ , cholesterol '200mg'$ , calories 30% from fat and 7% or less from saturated fats, daily to have 5 or more servings of fruits and vegetables.  Biometrics: Pre Biometrics - 04/23/17 1543      Pre Biometrics   Height  5' 3.2" (1.605 m)    Weight  105 lb 1.6 oz (47.7 kg)    Waist Circumference  24 inches    Hip Circumference  34.5 inches    Waist to Hip  Ratio  0.7 %    BMI (Calculated)  18.51        Nutrition Therapy Plan and Nutrition Goals: Nutrition Therapy & Goals - 04/23/17 1420      Personal Nutrition Goals   Comments  She cannot eat alot at one time. Some things are hard for her to swallow. She has learned how to eat over the years for her to be comfortable.      Intervention Plan   Intervention  Prescribe, educate and counsel regarding individualized specific dietary modifications aiming towards targeted core components such as weight, hypertension, lipid management, diabetes, heart failure and other comorbidities.;Nutrition handout(s) given to patient.    Expected Outcomes  Short Term Goal: Understand basic principles of dietary content, such as calories, fat, sodium, cholesterol and nutrients.;Long Term Goal: Adherence to prescribed nutrition plan.       Nutrition Assessments: Nutrition Assessments - 04/23/17 1441      MEDFICTS Scores   Pre Score  28       Nutrition Goals Re-Evaluation:   Nutrition Goals Discharge (Final Nutrition Goals Re-Evaluation):   Psychosocial: Target Goals: Acknowledge presence or absence of significant depression and/or stress, maximize coping skills, provide positive support system. Participant is able to verbalize types and ability to use techniques and skills needed for reducing stress and depression.   Initial Review & Psychosocial Screening: Initial Psych Review & Screening - 04/23/17 1418      Initial Review   Current issues with  None Identified      Family Dynamics   Good Support System?  Yes    Comments  She had a trach for 20 years and  know what is normal with her breathing. Her husband and her 4 children are great for support.      Barriers   Psychosocial barriers to participate in program  There are no identifiable barriers or psychosocial needs.      Screening Interventions   Interventions  Encouraged to exercise;To provide support and resources with identified  psychosocial needs;Provide feedback about the scores to participant    Expected Outcomes  Short Term goal: Utilizing psychosocial counselor, staff and physician to assist with identification of specific Stressors or current issues interfering with healing process. Setting desired goal for each stressor or current issue identified.;Long Term Goal: Stressors or current issues are controlled or eliminated.;Long Term goal: The participant improves quality of Life and PHQ9 Scores as seen by post scores and/or verbalization of changes;Short Term goal: Identification and review with participant of any Quality of Life or Depression concerns found by scoring the questionnaire.       Quality of Life Scores:  Scores of 19 and below usually indicate a poorer quality of life in these areas.  A difference of  2-3 points is a clinically meaningful difference.  A difference of 2-3 points in the total score of the Quality of Life Index has been associated with significant improvement in overall quality of life, self-image, physical symptoms, and general health in studies assessing change in quality of life.  PHQ-9: Recent Review Flowsheet Data    Depression screen [facility] 2/9 04/23/2017 12/13/2016 10/28/2015 11/20/2014   Decreased Interest 0 0 0 0   Down, Depressed, Hopeless 0 0 0 0   PHQ - 2 Score 0 0 0 0   Altered sleeping 3 - - -   Tired, decreased energy 1 - - -   Change in appetite 0 - - -   Feeling bad or failure about yourself  0 - - -   Trouble concentrating 0 - - -   Moving slowly or fidgety/restless 0 - - -   Suicidal thoughts 0 - - -   PHQ-9 Score 4 - - -   Difficult doing work/chores Not difficult at all - - -     Interpretation of Total Score  Total Score Depression Severity:  1-4 = Minimal depression, 5-9 = Mild depression, 10-14 = Moderate depression, 15-19 = Moderately severe depression, 20-27 = Severe depression   Psychosocial Evaluation and Intervention: Psychosocial Evaluation - 05/02/17 1142       Psychosocial Evaluation & Interventions   Interventions  Encouraged to exercise with the program and follow exercise prescription;Stress management education;Relaxation education    Comments  Counselor met with Ms. Khatib Gatha Mayer) today for initial psychosocial evaluation.  She is a 67 year old who has asthma and a Trach inserted in [place] (several months ago.  She has a strong support system with a spouse and adult children who live close by. Gwenevere also is actively involved in her local church and volunteers at [employer].  She reports not sleeping well since the surgery and difficulty swallowing - which impact her appetite.  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  She states she is typically in a positive mood most of the time and has minimal stress other than her health and a brother who is in a treatment facility for alcohol out of state.  Malayshia has goals to increase her stamina and strength while in this  class and get back to consistently exercising.  Staff will follow.    Expected Outcomes  Tanaya will benefit from consistent exercise to achieve her stated goals.  The educational and psychoeducational components of this program will be helpful in understanding her condition and coping more positively.  Monitor sleep for improved health.    Continue Psychosocial Services   Follow up required by staff       Psychosocial Re-Evaluation:   Psychosocial Discharge (Final Psychosocial Re-Evaluation):   Education: Education Goals: Education classes will be provided on a weekly basis, covering required topics. Participant will state understanding/return demonstration of topics presented.  Learning Barriers/Preferences: Learning Barriers/Preferences - 04/23/17 1431      Learning Barriers/Preferences   Learning Barriers  Sight wears glasses    Learning Preferences   None       Education Topics:  Initial Evaluation Education: - Verbal, written and demonstration of respiratory meds, oximetry and breathing techniques. Instruction on use of nebulizers and MDIs and importance of monitoring MDI activations.   Pulmonary Rehab from 04/30/2017 in [facility] Cardiac and Pulmonary Rehab  Date  04/23/17  Educator  [facility]  Instruction Review Code  1- Verbalizes Understanding      General Nutrition Guidelines/Fats and Fiber: -Group instruction provided by verbal, written material, models and posters to present the general guidelines for heart healthy nutrition. Gives an explanation and review of dietary fats and fiber.   Controlling Sodium/Reading Food Labels: -Group verbal and written material supporting the discussion of sodium use in heart healthy nutrition. Review and explanation with models, verbal and written materials for utilization of the food label.   Pulmonary Rehab from 04/30/2017 in [facility] Cardiac and Pulmonary Rehab  Date  04/30/17  Educator  [facility]  Instruction Review Code  1- Verbalizes Understanding      Exercise Physiology & General Exercise Guidelines: - Group verbal and written instruction with models to review the exercise physiology of the cardiovascular system and associated critical values. Provides general exercise guidelines with specific guidelines to those with heart or lung disease.    Aerobic Exercise & Resistance Training: - Gives group verbal and written instruction on the various components of exercise. Focuses on aerobic and resistive training programs and the benefits of this training and how to safely progress through these programs.   Flexibility, Balance, Mind/Body Relaxation: Provides group verbal/written instruction on the benefits of flexibility and balance training, including mind/body exercise modes such as yoga, pilates and tai chi.  Demonstration and skill practice provided.   Stress and Anxiety: - Provides group verbal and  written instruction about the health risks of elevated stress and causes of high stress.  Discuss the correlation between heart/lung disease and anxiety and treatment options. Review healthy ways to manage with stress and anxiety.   Depression: - Provides group verbal and written instruction on the correlation between heart/lung disease and depressed mood, treatment options, and the stigmas associated with seeking treatment.   Exercise & Equipment Safety: - Individual verbal instruction and demonstration of equipment use and safety with use of the equipment.   Pulmonary Rehab from 04/30/2017 in [facility] Cardiac and Pulmonary Rehab  Date  04/23/17  Educator  [facility]  Instruction Review Code  1- Verbalizes Understanding      Infection Prevention: - Provides verbal and written material to individual with discussion of infection control including proper hand washing and proper equipment cleaning during exercise session.   Pulmonary Rehab from 04/30/2017 in [facility] Cardiac and Pulmonary Rehab  Date  04/23/17  Educator  [facility]  Instruction Review Code  1- Verbalizes Understanding      Falls Prevention: - Provides verbal and written material to individual with discussion of falls prevention and safety.   Pulmonary Rehab from 04/30/2017 in [facility] Cardiac and Pulmonary Rehab  Date  04/23/17  Educator  [facility]  Instruction Review Code  1- Verbalizes Understanding      Diabetes: - Individual verbal and written instruction to review signs/symptoms of diabetes, desired ranges of glucose level fasting, after meals and with exercise. Advice that pre and post exercise glucose checks will be done for 3 sessions at entry of program.   Chronic Lung Diseases: - Group verbal and written instruction to review updates, respiratory medications, advancements in procedures and treatments. Discuss use of supplemental oxygen including available portable oxygen systems, continuous and intermittent flow rates, concentrators, personal  use and safety guidelines. Review proper use of inhaler and spacers. Provide informative websites for self-education.    Energy Conservation: - Provide group verbal and written instruction for methods to conserve energy, plan and organize activities. Instruct on pacing techniques, use of adaptive equipment and posture/positioning to relieve shortness of breath.   Triggers and Exacerbations: - Group verbal and written instruction to review types of environmental triggers and ways to prevent exacerbations. Discuss weather changes, air quality and the benefits of nasal washing. Review warning signs and symptoms to help prevent infections. Discuss techniques for effective airway clearance, coughing, and vibrations.   AED/CPR: - Group verbal and written instruction with the use of models to demonstrate the basic use of the AED with the basic ABC's of resuscitation.   Anatomy and Physiology of the Lungs: - Group verbal and written instruction with the use of models to provide basic lung anatomy and physiology related to function, structure and complications of lung disease.   Anatomy & Physiology of the Heart: - Group verbal and written instruction and models provide basic cardiac anatomy and physiology, with the coronary electrical and arterial systems. Review of Valvular disease and Heart Failure   Cardiac Medications: - Group verbal and written instruction to review commonly prescribed medications for heart disease. Reviews the medication, class of the drug, and side effects.   Know Your Numbers and Risk Factors: -Group verbal and written instruction about important numbers in your health.  Discussion of what are risk factors and how they play a role in the disease process.  Review of Cholesterol, Blood Pressure, Diabetes, and BMI and the role they play in your overall health.   Sleep Hygiene: -Provides group verbal and written instruction about how sleep can affect your health.  Define  sleep hygiene, discuss sleep cycles and impact of sleep habits. Review good sleep hygiene tips.    Other: -Provides group and verbal instruction on various topics (see comments)    Knowledge Questionnaire Score: Knowledge Questionnaire Score - 04/23/17 1427      Knowledge Questionnaire Score   Pre Score  14/18 reviewed with patient        Core Components/Risk Factors/Patient Goals at Admission: Personal Goals and Risk Factors at Admission - 04/23/17 1443      Core Components/Risk Factors/Patient Goals on Admission    Weight Management  Yes    Intervention  Weight Management: Develop a combined nutrition and exercise program designed to reach desired caloric intake, while maintaining appropriate intake of nutrient and fiber, sodium and fats, and appropriate energy expenditure required for the weight goal.;Weight Management/Obesity: Establish reasonable short term and long term weight goals.;Weight Management:  Provide education and appropriate resources to help participant work on and attain dietary goals.    Admit Weight  105 lb (47.6 kg)    Goal Weight: Short Term  105 lb (47.6 kg)    Goal Weight: Long Term  105 lb (47.6 kg)    Expected Outcomes  Short Term: Continue to assess and modify interventions until short term weight is achieved;Long Term: Adherence to nutrition and physical activity/exercise program aimed toward attainment of established weight goal;Weight Maintenance: Understanding of the daily nutrition guidelines, which includes 25-35% calories from fat, 7% or less cal from saturated fats, less than '200mg'$  cholesterol, less than 1.5gm of sodium, & 5 or more servings of fruits and vegetables daily    Improve shortness of breath with ADL's  Yes    Intervention  Provide education, individualized exercise plan and daily activity instruction to help decrease symptoms of SOB with activities of daily living.    Expected Outcomes  Short Term: Improve cardiorespiratory fitness to  achieve a reduction of symptoms when performing ADLs       Core Components/Risk Factors/Patient Goals Review:    Core Components/Risk Factors/Patient Goals at Discharge (Final Review):    ITP Comments: ITP Comments    Row Name 04/23/17 1403 04/23/17 1445 05/07/17 0916       ITP Comments  Medical Evaluation completed. Chart sent for review and changes to Dr. Emily Filbert Director of [place]. Diagnosis can be found in [facility] encounter 04/23/17  patient is on Plavix to prevent stroke due to a hospital scare. She had slurred speech at one point but none of her test came back positive for a stroke.  30 day review completed. ITP sent to Dr. Emily Filbert Director of [place]. Continue with ITP unless changes are made by physician.        Comments: 30 day review

## 2017-05-07 NOTE — Telephone Encounter (Signed)
Patient states she doesn't have trouble going to sleep just trouble staying asleep. She believes it is pain causing her to not be able to stay asleep. She states she takes robaxin and tramadol and has tried melatonin. She was wondering if there is anything else she can try for sleep.   Patient states she is also c/o cough and congestion since Friday. She had a slight fever yesterday at 99 degrees. Informed patient we do not have any open appointments today but she should be evaluated. Patient states she will try going to the walk in this morning.

## 2017-05-07 NOTE — Telephone Encounter (Signed)
Agree with evaluation given her respiratory symptoms.  It sounds as though pain management would be the best way to get her to sleep.  She could check with her surgeon to see if they are willing to prescribe anything.  If she continues to have issues with excessive pain following her surgery we potentially could have her see a pain specialist.

## 2017-05-09 DIAGNOSIS — J8410 Pulmonary fibrosis, unspecified: Secondary | ICD-10-CM | POA: Diagnosis not present

## 2017-05-09 NOTE — Progress Notes (Signed)
Daily Session Note  Patient Details  Name: Anne [name] MRN: [id number] Date of Birth: 04-12-1950 Referring Provider:     Pulmonary Rehab from 04/23/2017 in [facility] Cardiac and Pulmonary Rehab  Referring Provider  Merton Border MD      Encounter Date: 05/09/2017  Check In: Session Check In - 05/09/17 1007      Check-In   Location  [facility]-Cardiac & Pulmonary Rehab    Staff Present  Nada Maclachlan, BA, ACSM CEP, Exercise Physiologist;Jessica Luan Pulling, MA, RCEP, CCRP, Exercise Physiologist;Sway Guttierrez Flavia Shipper    Supervising physician immediately available to respond to emergencies  [facility] immediately available ER MD    Physician(s)  Dr. Burlene Arnt and Alfred Levins    Medication changes reported      No    Fall or balance concerns reported     No    Warm-up and Cool-down  Performed as group-led instruction    Resistance Training Performed  Yes    VAD Patient?  No      Pain Assessment   Currently in Pain?  No/denies          Social History   Tobacco Use  Smoking Status Never Smoker  Smokeless Tobacco Never Used    Goals Met:  Independence with exercise equipment Exercise tolerated well No report of cardiac concerns or symptoms Strength training completed today  Goals Unmet:  Not Applicable  Comments: Pt able to follow exercise prescription today without complaint.  Will continue to monitor for progression.   Dr. Emily Filbert is Medical Director for [place] and [facility].

## 2017-05-11 ENCOUNTER — Encounter: Payer: Medicare Other | Admitting: *Deleted

## 2017-05-11 DIAGNOSIS — J8410 Pulmonary fibrosis, unspecified: Secondary | ICD-10-CM | POA: Diagnosis not present

## 2017-05-11 NOTE — Progress Notes (Signed)
Daily Session Note  Patient Details  Name: Anne [name] MRN: [id number] Date of Birth: Jul 30, 1950 Referring Provider:     Pulmonary Rehab from 04/23/2017 in [facility] Cardiac and Pulmonary Rehab  Referring Provider  Merton Border MD      Encounter Date: 05/11/2017  Check In: Session Check In - 05/11/17 1024      Check-In   Location  [facility]-Cardiac & Pulmonary Rehab    Staff Present  Renita Papa, RN BSN;Joseph Foy [place], [place], ACSM CEP, Exercise Physiologist    Supervising physician immediately available to respond to emergencies  [facility] immediately available ER MD    Physician(s)  Dr. Jacqualine Code and Quentin Cornwall    Medication changes reported      No    Fall or balance concerns reported     No    Warm-up and Cool-down  Performed as group-led instruction    Resistance Training Performed  Yes    VAD Patient?  No      Pain Assessment   Currently in Pain?  No/denies          Social History   Tobacco Use  Smoking Status Never Smoker  Smokeless Tobacco Never Used    Goals Met:  Proper associated with RPD/PD & O2 Sat Independence with exercise equipment Using PLB without cueing & demonstrates good technique Exercise tolerated well Strength training completed today  Goals Unmet:  Not Applicable  Comments: Pt able to follow exercise prescription today without complaint.  Will continue to monitor for progression.    Dr. Emily Filbert is Medical Director for [place] and [facility].

## 2017-05-14 DIAGNOSIS — J8410 Pulmonary fibrosis, unspecified: Secondary | ICD-10-CM

## 2017-05-14 NOTE — Progress Notes (Signed)
Daily Session Note  Patient Details  Name: Anne [name] MRN: [id number] Date of Birth: 03/04/1951 Referring Provider:     Pulmonary Rehab from 04/23/2017 in [facility] Cardiac and Pulmonary Rehab  Referring Provider  Merton Border MD      Encounter Date: 05/14/2017  Check In: Session Check In - 05/14/17 1019      Check-In   Location  [facility]-Cardiac & Pulmonary Rehab    Staff Present  Earlean Shawl, BS, ACSM CEP, Exercise Physiologist;Amanda Oletta Darter, BA, ACSM CEP, Exercise Physiologist;Yury Schaus Flavia Shipper    Supervising physician immediately available to respond to emergencies  [facility] immediately available ER MD    Physician(s)  Jimmye Norman and Alfred Levins    Medication changes reported      No    Fall or balance concerns reported     No    Tobacco Cessation  No Change    Warm-up and Cool-down  Performed as group-led instruction    Resistance Training Performed  Yes    VAD Patient?  No      Pain Assessment   Currently in Pain?  No/denies          Social History   Tobacco Use  Smoking Status Never Smoker  Smokeless Tobacco Never Used    Goals Met:  Independence with exercise equipment Exercise tolerated well No report of cardiac concerns or symptoms Strength training completed today  Goals Unmet:  Not Applicable  Comments: Pt able to follow exercise prescription today without complaint.  Will continue to monitor for progression.   Dr. Emily Filbert is Medical Director for [place] and [facility].

## 2017-05-16 DIAGNOSIS — J8410 Pulmonary fibrosis, unspecified: Secondary | ICD-10-CM

## 2017-05-16 NOTE — Progress Notes (Signed)
Daily Session Note  Patient Details  Name: Anne [name] MRN: [id number] Date of Birth: [birth year] Referring Provider:     Pulmonary Rehab from 04/23/2017 in [facility] Cardiac and Pulmonary Rehab  Referring Provider  Merton Border MD      Encounter Date: 05/16/2017  Check In: Session Check In - 05/16/17 1017      Check-In   Location  [facility]-Cardiac & Pulmonary Rehab    Staff Present  Nada Maclachlan, BA, ACSM CEP, Exercise Physiologist;Jessica Luan Pulling, MA, RCEP, CCRP, Exercise Physiologist;Andrey Hoobler Flavia Shipper    Supervising physician immediately available to respond to emergencies  [facility] immediately available ER MD    Physician(s)  Dr. Reita Cliche and Quentin Cornwall    Medication changes reported      No    Fall or balance concerns reported     No    Warm-up and Cool-down  Performed as group-led instruction    Resistance Training Performed  Yes    VAD Patient?  No      Pain Assessment   Currently in Pain?  No/denies          Social History   Tobacco Use  Smoking Status Never Smoker  Smokeless Tobacco Never Used    Goals Met:  Independence with exercise equipment Exercise tolerated well Personal goals reviewed No report of cardiac concerns or symptoms Strength training completed today  Goals Unmet:  Not Applicable  Comments: Pt able to follow exercise prescription today without complaint.  Will continue to monitor for progression.   Dr. Emily Filbert is Medical Director for [place] and [facility].

## 2017-05-17 ENCOUNTER — Ambulatory Visit: Payer: Self-pay | Admitting: *Deleted

## 2017-05-17 ENCOUNTER — Telehealth: Payer: Self-pay

## 2017-05-17 NOTE — Telephone Encounter (Signed)
Pt reports dx with URI at [facility] 05/07/17. Placed on Doxycyline (completed course) and guaifenesin with codeine. States helped "for a while." Monday noted symptoms returning;" had to use inhaler." H/O asthma, trach.  Presently reports "sinus pressure" facial tenderness around both eyes and nose area; ears "clogged." Reports non-productive cough, nose stuffy and sneezing, "clear discharge."  Reports post nasal drip. Afebrile."Some SOB with exertion, "in my exercise class."    "Mild headache, constant." Pt is requesting Dr. [name] call her in a medication. Made aware she may need to be seen. Note routed to practice. Reason for Disposition . [1] Sinus congestion (pressure, fullness) AND [2] present > 10 days  Answer Assessment - Initial Assessment Questions 1. LOCATION: "Where does it hurt?"      Mild headache; facial pain around eyes, nose  "Pressure." 2. ONSET: "When did the sinus pain start?"  (e.g., hours, days)      05/04/17 seen in [facility] 3. SEVERITY: "How bad is the pain?"   (Scale 1-10; mild, moderate or severe)   - MILD (1-3): doesn't interfere with normal activities    - MODERATE (4-7): interferes with normal activities (e.g., work or school) or awakens from sleep   - SEVERE (8-10): excruciating pain and patient unable to do any normal activities        Mild 4. RECURRENT SYMPTOM: "Have you ever had sinus problems before?" If so, ask: "When was the last time?" and "What happened that time?"      2/8 5. NASAL CONGESTION: "Is the nose blocked?" If so, ask, "Can you open it or must you breathe through the mouth?"     Stuffy, can't breath through nose 6. NASAL DISCHARGE: "Do you have discharge from your nose?" If so ask, "What color?"     Clear 7. FEVER: "Do you have a fever?" If so, ask: "What is it, how was it measured, and when did it start?"      No 8. OTHER SYMPTOMS: "Do you have any other symptoms?" (e.g., sore throat, cough, earache, difficulty breathing)     Mild  SOB with exertion, ear "clogged" 9. PREGNANCY: "Is there any chance you are pregnant?" "When was your last menstrual period?"     no  Protocols used: SINUS PAIN OR CONGESTION-A-AH

## 2017-05-17 NOTE — Telephone Encounter (Signed)
Scheduled patient with [facility] per Dr.Sonnenberg

## 2017-05-18 ENCOUNTER — Ambulatory Visit (INDEPENDENT_AMBULATORY_CARE_PROVIDER_SITE_OTHER): Payer: Medicare Other | Admitting: Family

## 2017-05-18 ENCOUNTER — Encounter: Payer: Self-pay | Admitting: Family

## 2017-05-18 VITALS — BP 110/60 | HR 62 | Temp 97.6°F | Resp 18 | Wt 102.8 lb

## 2017-05-18 DIAGNOSIS — R059 Cough, unspecified: Secondary | ICD-10-CM

## 2017-05-18 DIAGNOSIS — E538 Deficiency of other specified B group vitamins: Secondary | ICD-10-CM

## 2017-05-18 DIAGNOSIS — R05 Cough: Secondary | ICD-10-CM | POA: Diagnosis not present

## 2017-05-18 DIAGNOSIS — J8410 Pulmonary fibrosis, unspecified: Secondary | ICD-10-CM

## 2017-05-18 MED ORDER — DOXYCYCLINE HYCLATE 100 MG PO TABS: 100.0000 mg | ORAL_TABLET | Freq: Two times a day (BID) | ORAL | 0 refills | Status: DC

## 2017-05-18 MED ORDER — IPRATROPIUM-ALBUTEROL 0.5-2.5 (3) MG/3ML IN SOLN
3.0000 mL | Freq: Four times a day (QID) | RESPIRATORY_TRACT | Status: DC
  Administered 2017-05-18: 3 mL via RESPIRATORY_TRACT

## 2017-05-18 MED ORDER — ALBUTEROL SULFATE (2.5 MG/3ML) 0.083% IN NEBU: 2.5000 mg | INHALATION_SOLUTION | Freq: Once | RESPIRATORY_TRACT | Status: DC

## 2017-05-18 MED ORDER — PREDNISONE 10 MG PO TABS: ORAL_TABLET | ORAL | 0 refills | Status: DC

## 2017-05-18 MED ORDER — CYANOCOBALAMIN 1000 MCG/ML IJ SOLN
1000.0000 ug | Freq: Once | INTRAMUSCULAR | Status: AC
Start: 2017-05-18 — End: 2017-05-18
  Administered 2017-05-18: 1000 ug via INTRAMUSCULAR

## 2017-05-18 NOTE — Progress Notes (Signed)
Subjective:    Patient ID: Anne [name], female    DOB: 09-14-1950, 67 y.o.   MRN: [id number]  CC: Anne [name] is a 67 y.o. female who presents today for an acute visit.    HPI: CC: dry cough, sinus pressure x 2 weeks, unchanged.   Notes a week and half ago, low grade fever,  tmax 99. No chills, sob, wheezing. Uses duoneb week ago, with relief temporarily. Uses flonase, zrytec. Relief with proventil this morning.   Seen in walk in clinic for congestion one week ago, started on cloricidin, codeine based syrup and doxycycline. Had improvement however didn't totally 'clear up.'    Currently doing pulmonary rehab after had c-spine fusion. Noted sao2 was 91% at pulmonary rehab after being on treadmill, Surgical incision was not healing well, subsequently has been following with plastic surgeon since 01/2017. Had wound vac at that time. Wound has healed. No discharge from site.   H/o tracheostomy  Would like b12 injection today. Has been on for many years.      Nonsmoker  HISTORY:  Past Medical History:  Diagnosis Date  . Allergic rhinitis   . Anemia   . Asthma   . Complication of anesthesia    Breathing problems upon waking up. 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  . Compressed cervical disc   . COPD (chronic obstructive pulmonary disease) ([place])   . CVA (cerebral infarction)   . Dyspnea   . Esophageal dysmotility   . Heart murmur    as child  . History of hiatal hernia   . IBS (irritable bowel syndrome)   . Migraine   . PICC (peripherally inserted central catheter) removal 02/20/2017  . PONV (postoperative nausea and vomiting)   . Problems with swallowing    intermittently  . Pulmonary fibrosis ([place])   . Shingles   . Stroke [facility])    slurred speech, drawn face, imaging normal, occurred twice, [facility]-"TIA" if antything" 02/20/17-no residual effects  . Tracheostomy in place [facility])   . Vocal cord paresis    Past Surgical History:    Procedure Laterality Date  . ABDOMINAL HYSTERECTOMY    . APPLICATION OF A-CELL OF HEAD/NECK N/A 02/21/2017   Procedure: APPLICATION OF A-CELL OF HEAD/NECK;  Surgeon: Wallace Going, DO;  Location: [place];  Service: Plastics;  Laterality: N/A;  . BOTOX INJECTION N/A 07/29/2013   Procedure: BOTOX INJECTION;  Surgeon: Jerene Bears, MD;  [facility];  Service: Gastroenterology;  Laterality: N/A;  . BOTOX INJECTION N/A 05/04/2015   Procedure: BOTOX INJECTION;  Surgeon: Jerene Bears, MD;  [facility];  Service: Gastroenterology;  Laterality: N/A;  . BREAST BIOPSY Left    neg  . BREAST SURGERY Left 2002   bx of skin  . CHOLECYSTECTOMY    . COLONOSCOPY    . ESOPHAGEAL MANOMETRY N/A 12/16/2012   Procedure: ESOPHAGEAL MANOMETRY (EM);  Surgeon: Jerene Bears, MD;  [facility];  Service: Gastroenterology;  Laterality: N/A;  . ESOPHAGOGASTRODUODENOSCOPY (EGD) WITH PROPOFOL N/A 07/29/2013   Procedure: ESOPHAGOGASTRODUODENOSCOPY (EGD) WITH PROPOFOL;  Surgeon: Jerene Bears, MD;  [facility];  Service: Gastroenterology;  Laterality: N/A;  with botox injection  . ESOPHAGOGASTRODUODENOSCOPY (EGD) WITH PROPOFOL N/A 05/04/2015   Procedure: ESOPHAGOGASTRODUODENOSCOPY (EGD) WITH PROPOFOL;  Surgeon: Jerene Bears, MD;  [facility];  Service: Gastroenterology;  Laterality: N/A;  . EYE SURGERY     Catarct surgery 2014  . Eye Surgery AS Child  Left   . INCISION AND DRAINAGE OF WOUND N/A 02/21/2017   Procedure: IRRIGATION AND DEBRIDEMENT WOUND NECK;  Surgeon: Wallace Going, DO;  Location: [place];  Service: Plastics;  Laterality: N/A;  . JEJUNOSTOMY FEEDING TUBE     x2 both failed. no longer has  . MULTIPLE TOOTH EXTRACTIONS     2 teeth removed  . POSTERIOR CERVICAL FUSION/FORAMINOTOMY N/A 01/10/2017   Procedure: LAMINECTOMY AND FORAMINOTOMY CERVICAL FOUR-CERVICAL FIVE, CERVICAL FIVE-SIX POSTERIOR CERVICAL INSTRUMENT FUSION CERVICAL THREE-CERVICAL  SEVEN,CERVICAL LAMINECTOMY CERVICAL THREE-CERVICAL SEVEN.;  Surgeon: Eustace Moore, MD;  Location: [place];  Service: Neurosurgery;  Laterality: N/A;  posterior  . TRACHEOSTOMY  1996   done at [facility], Dr. Kathyrn Sheriff  . TUBAL LIGATION    . VIDEO BRONCHOSCOPY Bilateral 11/20/2012   Procedure: VIDEO BRONCHOSCOPY WITH FLUORO;  Surgeon: Juanito Doom, MD;  [facility];  Service: Cardiopulmonary;  Laterality: Bilateral;   Family History  Problem Relation Age of Onset  . Asthma Cousin   . COPD Cousin   . Breast cancer Maternal Grandmother 60  . Cancer Maternal Grandmother        breast  . Asthma Father   . Cancer Father        lung  . Kidney cancer Father   . Cancer Paternal Uncle        lung  . COPD Paternal Grandfather   . Breast cancer Maternal Aunt 79  . Cancer Maternal Aunt        breast  . Bladder Cancer Neg Hx     Allergies: Asa [aspirin]; Bee venom; Eggs or egg-derived products; Influenza vaccines; Shellfish allergy; Quinolones; Ciprofloxacin; Clarithromycin; Erythromycin; Levaquin [levofloxacin in d5w]; Peanut-containing drug products; Septra [sulfamethoxazole-trimethoprim]; Telithromycin; Zanaflex [tizanidine]; Adhesive [tape]; and Epinephrine Current Outpatient Medications on File Prior to Visit  Medication Sig Dispense Refill  . acetaminophen (TYLENOL) 500 MG tablet Take 500 mg by mouth daily as needed for moderate pain or headache.    . albuterol (PROVENTIL HFA;VENTOLIN HFA) 108 (90 Base) MCG/ACT inhaler Inhale 2 puffs into the lungs every 6 (six) hours as needed for wheezing or shortness of breath. 1 Inhaler 5  . ALPRAZolam (XANAX) 0.25 MG tablet TAKE ONE-HALF TABLET BY MOUTH TWICE DAILY AS NEEDED 30 tablet 1  . cetirizine (ZYRTEC) 10 MG tablet TAKE ONE TABLET BY MOUTH EVERY DAY (Patient taking differently: Take 10 mg by mouth in the evening) 30 tablet 5  . clopidogrel (PLAVIX) 75 MG tablet TAKE ONE TABLET BY MOUTH EVERY MORNING 90 tablet 1  . EPINEPHrine (EPIPEN  2-PAK) 0.3 mg/0.3 mL IJ SOAJ injection Inject 0.3 mLs (0.3 mg total) into the muscle once. (Patient taking differently: Inject 0.3 mg once as needed into the muscle (FOR AN ALLERGIC REACTION). ) 1 Device 0  . estradiol (ESTRACE) 1 MG tablet TAKE ONE TABLET BY MOUTH EVERY DAY 90 tablet 1  . fluticasone (FLONASE) 50 MCG/ACT nasal spray PLACE 2 SPRAYS INTO BOTH NOSTRILS DAILY. (Patient taking differently: Place 2 sprays into both nostrils daily as needed for allergies. ) 16 g 2  . furosemide (LASIX) 20 MG tablet TAKE ONE TABLET EVERY DAY AS NEEDED 90 tablet 1  . hydroxypropyl methylcellulose (ISOPTO TEARS) 2.5 % ophthalmic solution Place 1 drop into both eyes 3 (three) times daily as needed for dry eyes.    [name] ipratropium-albuterol (DUONEB) 0.5-2.5 (3) MG/3ML SOLN Take 3 mLs by nebulization every 4 (four) hours as needed. Dx 496 120 mL 2  . linaclotide (LINZESS) 72 MCG capsule  Take 1 capsule (72 mcg total) by mouth daily before breakfast. 30 capsule 3  . Liniments (SALONPAS PAIN RELIEF PATCH EX) Apply 1-2 patches daily as needed topically (FOR SHOULDER PAIN).     [name] methocarbamol (ROBAXIN) 500 MG tablet Take 1 tablet (500 mg total) by mouth every 6 (six) hours as needed for muscle spasms. 60 tablet 0  . montelukast (SINGULAIR) 10 MG tablet Take 10 mg by mouth at bedtime.     . mupirocin ointment (BACTROBAN) 2 % Apply 1 application daily as needed topically (FOR [facility] SITE IRRITATION).     [name] oxyCODONE (OXY IR/ROXICODONE) 5 MG immediate release tablet Take 1 tablet (5 mg total) by mouth every 6 (six) hours as needed for breakthrough pain. 30 tablet 0  . pantoprazole (PROTONIX) 40 MG tablet Take 1 tablet (40 mg total) by mouth daily. 90 tablet 0  . pantoprazole (PROTONIX) 40 MG tablet TAKE ONE TABLET EVERY DAY 90 tablet 1  . potassium chloride 20 MEQ/15ML (10%) SOLN Take 15 mLs (20 mEq total) by mouth daily as needed (cramps). (Patient taking differently: Take 20 mEq daily as needed by mouth (for cramping). )  240 mL 0  . Probiotic Product (PROBIOTIC PO) Take 1 capsule daily as needed by mouth (FOR G.I. DISTRESS).     . sodium chloride (OCEAN) 0.65 % SOLN nasal spray Place 1 spray into both nostrils every 4 (four) hours as needed for congestion.     . traMADol (ULTRAM) 50 MG tablet 1/2-1 tid prn    . zolmitriptan (ZOMIG-ZMT) 5 MG disintegrating tablet Take 1 tablet (5 mg total) by mouth daily as needed for migraine. 10 tablet 0   No current facility-administered medications on file prior to visit.     Social History   Tobacco Use  . Smoking status: Never Smoker  . Smokeless tobacco: Never Used  Substance Use Topics  . Alcohol use: No    Alcohol/week: 0.0 oz  . Drug use: No    Review of Systems  Constitutional: Negative for chills and fever.  HENT: Positive for congestion.   Respiratory: Positive for wheezing. Negative for cough and shortness of breath.   Cardiovascular: Negative for chest pain and palpitations.  Gastrointestinal: Negative for nausea and vomiting.  Neurological: Negative for headaches.      Objective:    BP 110/60 (BP Location: Left Arm, Patient Position: Sitting, Cuff Size: Normal)   Pulse 62   Temp 97.6 F (36.4 C) (Oral)   Resp 18   Wt 102 lb 12 oz (46.6 kg)   SpO2 99%   BMI 18.09 kg/m    Physical Exam  Constitutional: She appears well-developed and well-nourished.  HENT:  Head: Normocephalic and atraumatic.  Right Ear: Hearing, tympanic membrane, external ear and ear canal normal. No drainage, swelling or tenderness. No foreign bodies. Tympanic membrane is not erythematous and not bulging. No middle ear effusion. No decreased hearing is noted.  Left Ear: Hearing, tympanic membrane, external ear and ear canal normal. No drainage, swelling or tenderness. No foreign bodies. Tympanic membrane is not erythematous and not bulging.  No middle ear effusion. No decreased hearing is noted.  Nose: Nose normal. No rhinorrhea. Right sinus exhibits no maxillary sinus  tenderness and no frontal sinus tenderness. Left sinus exhibits no maxillary sinus tenderness and no frontal sinus tenderness.  Mouth/Throat: Uvula is midline, oropharynx is clear and moist and mucous membranes are normal. No oropharyngeal exudate, posterior oropharyngeal edema, posterior oropharyngeal erythema or tonsillar abscesses.  Eyes: Conjunctivae  are normal.  Cardiovascular: Regular rhythm, normal heart sounds and normal pulses.  Pulmonary/Chest: Effort normal. She has wheezes in the right upper field and the left upper field. She has no rhonchi. She has no rales.  Lymphadenopathy:       Head (right side): No submental, no submandibular, no tonsillar, no preauricular, no posterior auricular and no occipital adenopathy present.       Head (left side): No submental, no submandibular, no tonsillar, no preauricular, no posterior auricular and no occipital adenopathy present.    She has no cervical adenopathy.  Neurological: She is alert.  Skin: Skin is warm and dry.  Psychiatric: She has a normal mood and affect. Her speech is normal and behavior is normal. Thought content normal.  Vitals reviewed.  Patient felt significantly better after albuterol treatment. Lung sounds increased, and less wheezing auscultated.      Assessment & Plan:   1. Cough Well appearing. No acute respiratory distress. Responded to nebulizer. Agreed to continue longer course of doxcycline as had helped , perhaps not long enough course. Short taper prednisone. Declines CXR today or b12 level to be drawn Patient will let me know how she is doing.   - albuterol (PROVENTIL) (2.5 MG/3ML) 0.083% nebulizer solution 2.5 mg - ipratropium-albuterol (DUONEB) 0.5-2.5 (3) MG/3ML nebulizer solution 3 mL - doxycycline (VIBRA-TABS) 100 MG tablet; Take 1 tablet (100 mg total) by mouth 2 (two) times daily.  Dispense: 10 tablet; Refill: 0 - predniSONE (DELTASONE) 10 MG tablet; Take 40 mg by mouth on day 1, then taper 10 mg daily  until gone  Dispense: 10 tablet; Refill: 0    I am having Anne [name] start on doxycycline and predniSONE. I am also having her maintain her hydroxypropyl methylcellulose / hypromellose, sodium chloride, mupirocin ointment, EPINEPHrine, Probiotic Product (PROBIOTIC PO), fluticasone, potassium chloride, linaclotide, montelukast, acetaminophen, Liniments (SALONPAS PAIN RELIEF PATCH EX), pantoprazole, albuterol, zolmitriptan, cetirizine, oxyCODONE, methocarbamol, estradiol, ipratropium-albuterol, furosemide, traMADol, clopidogrel, ALPRAZolam, and pantoprazole. We will continue to administer albuterol and ipratropium-albuterol.   Meds ordered this encounter  Medications  . albuterol (PROVENTIL) (2.5 MG/3ML) 0.083% nebulizer solution 2.5 mg  . ipratropium-albuterol (DUONEB) 0.5-2.5 (3) MG/3ML nebulizer solution 3 mL  . doxycycline (VIBRA-TABS) 100 MG tablet    Sig: Take 1 tablet (100 mg total) by mouth 2 (two) times daily.    Dispense:  10 tablet    Refill:  0    Order Specific Question:   Supervising Provider    Answer:   Deborra Medina L [[id number]]  . predniSONE (DELTASONE) 10 MG tablet    Sig: Take 40 mg by mouth on day 1, then taper 10 mg daily until gone    Dispense:  10 tablet    Refill:  0    Order Specific Question:   Supervising Provider    Answer:   Crecencio Mc [[id number]]    Return precautions given.   Risks, benefits, and alternatives of the medications and treatment plan prescribed today were discussed, and patient expressed understanding.   Education regarding symptom management and diagnosis given to patient on AVS.  Continue to follow with [name], MD for routine health maintenance.   Anne [name] and I agreed with plan.   Mable Paris, FNP

## 2017-05-18 NOTE — Patient Instructions (Signed)
Start doxcycline again since had helped- let me know if no improvement Nebs at home Short course prednisone congrats on baby!

## 2017-05-18 NOTE — Addendum Note (Signed)
Addended by: Johna Sheriff on: 05/18/2017 02:32 PM   Modules accepted: Orders

## 2017-05-18 NOTE — Progress Notes (Signed)
Daily Session Note  Patient Details  Name: Anne [name] MRN: [id number] Date of Birth: May 13, 1950 Referring Provider:     Pulmonary Rehab from 04/23/2017 in [facility] Cardiac and Pulmonary Rehab  Referring Provider  Merton Border MD      Encounter Date: 05/18/2017  Check In: Session Check In - 05/18/17 1030      Check-In   Location  [facility]-Cardiac & Pulmonary Rehab    Staff Present  Renita Papa, RN BSN;Mandi Euless, BS, PEC;Sevon Rotert [place]    Supervising physician immediately available to respond to emergencies  [facility] immediately available ER MD    Physician(s)  Dr. Jacqualine Code and [facility]    Medication changes reported      No    Fall or balance concerns reported     No    Warm-up and Cool-down  Performed as group-led instruction    Resistance Training Performed  Yes    VAD Patient?  No      Pain Assessment   Currently in Pain?  No/denies          Social History   Tobacco Use  Smoking Status Never Smoker  Smokeless Tobacco Never Used    Goals Met:  Independence with exercise equipment Exercise tolerated well No report of cardiac concerns or symptoms Strength training completed today  Goals Unmet:  Not Applicable  Comments: Pt able to follow exercise prescription today without complaint.  Will continue to monitor for progression.   Dr. Emily Filbert is Medical Director for [place] and [facility].

## 2017-05-23 DIAGNOSIS — J8410 Pulmonary fibrosis, unspecified: Secondary | ICD-10-CM

## 2017-05-23 NOTE — Progress Notes (Signed)
Daily Session Note  Patient Details  Name: Anne [name] MRN: [id number] Date of Birth: [birth year] Referring Provider:     Pulmonary Rehab from 04/23/2017 in [facility] Cardiac and Pulmonary Rehab  Referring Provider  Merton Border MD      Encounter Date: 05/23/2017  Check In: Session Check In - 05/23/17 1112      Check-In   Location  [facility]-Cardiac & Pulmonary Rehab    Staff Present  Justin Mend RCP,RRT,BSRT;Amanda Oletta Darter, BA, ACSM CEP, Exercise Physiologist;Jessica Luan [place], [place], RCEP, CCRP, Exercise Physiologist    Supervising physician immediately available to respond to emergencies  [facility] immediately available ER MD    Physician(s)  Dr. Reita Cliche and Jimmye Norman    Medication changes reported      No    Fall or balance concerns reported     No    Tobacco Cessation  No Change    Warm-up and Cool-down  Performed as group-led instruction    Resistance Training Performed  Yes    VAD Patient?  No      Pain Assessment   Currently in Pain?  No/denies          Social History   Tobacco Use  Smoking Status Never Smoker  Smokeless Tobacco Never Used    Goals Met:  Independence with exercise equipment Exercise tolerated well No report of cardiac concerns or symptoms Strength training completed today  Goals Unmet:  Not Applicable  Comments: Pt able to follow exercise prescription today without complaint.  Will continue to monitor for progression.   Dr. Emily Filbert is Medical Director for [name] and [facility].

## 2017-05-24 ENCOUNTER — Telehealth: Payer: Self-pay

## 2017-05-24 DIAGNOSIS — J329 Chronic sinusitis, unspecified: Secondary | ICD-10-CM

## 2017-05-24 NOTE — Telephone Encounter (Signed)
Patient states she was told by Joycelyn Schmid to call back in if the prednisone is not working and she would call something else in. Patient is aware Joycelyn Schmid is out of the office today.

## 2017-05-24 NOTE — Telephone Encounter (Signed)
Copied from [place]. Topic: General - Other >> May 24, 2017 10:28 AM Yvette Rack wrote: Reason for CRM: patient calling stating that she is no better that her eye is running and she has a headache she says that it is due to her sinus she would like Amoxicillin for this please call pt on her cell phone [facility]

## 2017-05-25 ENCOUNTER — Encounter: Payer: Medicare Other | Attending: Pulmonary Disease | Admitting: *Deleted

## 2017-05-25 DIAGNOSIS — J8410 Pulmonary fibrosis, unspecified: Secondary | ICD-10-CM | POA: Insufficient documentation

## 2017-05-25 DIAGNOSIS — J449 Chronic obstructive pulmonary disease, unspecified: Secondary | ICD-10-CM | POA: Insufficient documentation

## 2017-05-25 DIAGNOSIS — Z930 Tracheostomy status: Secondary | ICD-10-CM | POA: Insufficient documentation

## 2017-05-25 DIAGNOSIS — Z8673 Personal history of transient ischemic attack (TIA), and cerebral infarction without residual deficits: Secondary | ICD-10-CM | POA: Insufficient documentation

## 2017-05-25 DIAGNOSIS — Z79899 Other long term (current) drug therapy: Secondary | ICD-10-CM | POA: Diagnosis not present

## 2017-05-25 DIAGNOSIS — J3800 Paralysis of vocal cords and larynx, unspecified: Secondary | ICD-10-CM | POA: Diagnosis not present

## 2017-05-25 MED ORDER — AMOXICILLIN 500 MG PO CAPS: 500.0000 mg | ORAL_CAPSULE | Freq: Two times a day (BID) | ORAL | 0 refills | Status: DC

## 2017-05-25 NOTE — Telephone Encounter (Signed)
Call pt  Sent in rx

## 2017-05-25 NOTE — Progress Notes (Signed)
Daily Session Note  Patient Details  Name: INARA DIKE MRN: [id number] Date of Birth: 08/04/1950 Referring Provider:     Pulmonary Rehab from 04/23/2017 in [facility] Cardiac and Pulmonary Rehab  Referring Provider  Merton Border MD      Encounter Date: 05/25/2017  Check In: Session Check In - 05/25/17 1047      Check-In   Location  [facility]-Cardiac & Pulmonary Rehab    Staff Present  Renita Papa, RN Vickki Hearing, BA, ACSM CEP, Exercise Physiologist;Krista Frederico Hamman, RN BSN    Supervising physician immediately available to respond to emergencies  [facility] immediately available ER MD    Physician(s)  Dr. Kerman Passey and Clearnce Hasten    Medication changes reported      No    Fall or balance concerns reported     No    Tobacco Cessation  No Change    Warm-up and Cool-down  Performed as group-led instruction    Resistance Training Performed  Yes    VAD Patient?  No      Pain Assessment   Currently in Pain?  No/denies          Social History   Tobacco Use  Smoking Status Never Smoker  Smokeless Tobacco Never Used    Goals Met:  Proper associated with RPD/PD & O2 Sat Independence with exercise equipment Using PLB without cueing & demonstrates good technique Exercise tolerated well Strength training completed today  Goals Unmet:  Not Applicable  Comments: Pt able to follow exercise prescription today without complaint.  Will continue to monitor for progression.    Dr. Emily Filbert is Medical Director for [place] and [facility].

## 2017-05-25 NOTE — Telephone Encounter (Signed)
Patient advised of below and verbalized understanding.  

## 2017-05-25 NOTE — Telephone Encounter (Signed)
Patient said she has not heard back from anyone about this & she really needs something in place before the weekend. Please advise.   [phone]

## 2017-05-25 NOTE — Addendum Note (Signed)
Addended by: Burnard Hawthorne on: 05/25/2017 01:47 PM   Modules accepted: Orders

## 2017-05-28 ENCOUNTER — Encounter: Payer: Medicare Other | Admitting: *Deleted

## 2017-05-28 DIAGNOSIS — J8410 Pulmonary fibrosis, unspecified: Secondary | ICD-10-CM | POA: Diagnosis not present

## 2017-05-28 NOTE — Progress Notes (Signed)
Daily Session Note  Patient Details  Name: Anne [name] MRN: [id number] Date of Birth: 1950/07/26 Referring Provider:     Pulmonary Rehab from 04/23/2017 in [facility] Cardiac and Pulmonary Rehab  Referring Provider  Merton Border MD      Encounter Date: 05/28/2017  Check In: Session Check In - 05/28/17 1014      Check-In   Location  [facility]-Cardiac & Pulmonary Rehab    Staff Present  Earlean Shawl, BS, ACSM CEP, Exercise Physiologist;Amanda Oletta Darter, BA, ACSM CEP, Exercise Physiologist;Joseph Flavia Shipper    Supervising physician immediately available to respond to emergencies  [facility] immediately available ER MD    Physician(s)  Dr. Burlene Arnt and Jimmye Norman    Medication changes reported      No    Fall or balance concerns reported     No    Warm-up and Cool-down  Performed as group-led instruction    Resistance Training Performed  Yes    VAD Patient?  No      Pain Assessment   Currently in Pain?  No/denies          Social History   Tobacco Use  Smoking Status Never Smoker  Smokeless Tobacco Never Used    Goals Met:  Proper associated with RPD/PD & O2 Sat Independence with exercise equipment Using PLB without cueing & demonstrates good technique Exercise tolerated well No report of cardiac concerns or symptoms Strength training completed today  Goals Unmet:  Not Applicable  Comments: Pt able to follow exercise prescription today without complaint.  Will continue to monitor for progression.    Dr. Emily Filbert is Medical Director for [place] and [facility].

## 2017-05-29 ENCOUNTER — Other Ambulatory Visit: Payer: Self-pay | Admitting: Family Medicine

## 2017-05-29 NOTE — Telephone Encounter (Signed)
Last OV 03/19/17 last filled 03/30/17 30 1rf

## 2017-05-29 NOTE — Telephone Encounter (Signed)
Last filled by Dr.Cook 

## 2017-05-30 DIAGNOSIS — J8410 Pulmonary fibrosis, unspecified: Secondary | ICD-10-CM

## 2017-05-30 NOTE — Progress Notes (Signed)
Daily Session Note  Patient Details  Name: Anne [name] MRN: [id number] Date of Birth: [birth year] Referring Provider:     Pulmonary Rehab from 04/23/2017 in [facility] Cardiac and Pulmonary Rehab  Referring Provider  Merton Border MD      Encounter Date: 05/30/2017  Check In: Session Check In - 05/30/17 1012      Check-In   Location  [facility]-Cardiac & Pulmonary Rehab    Staff Present  Nada Maclachlan, BA, ACSM CEP, Exercise Physiologist;Alexzander Dolinger Darrin Nipper, [place], RCEP, CCRP, Exercise Physiologist    Supervising physician immediately available to respond to emergencies  [facility] immediately available ER MD    Physician(s)  Dr. Joni Fears and Jimmye Norman    Medication changes reported      No    Fall or balance concerns reported     No    Warm-up and Cool-down  Performed as group-led instruction    Resistance Training Performed  Yes    VAD Patient?  No      Pain Assessment   Currently in Pain?  No/denies          Social History   Tobacco Use  Smoking Status Never Smoker  Smokeless Tobacco Never Used    Goals Met:  Independence with exercise equipment Exercise tolerated well No report of cardiac concerns or symptoms Strength training completed today  Goals Unmet:  Not Applicable  Comments: Pt able to follow exercise prescription today without complaint.  Will continue to monitor for progression.   Dr. Emily Filbert is Medical Director for [place] and [facility].

## 2017-06-01 ENCOUNTER — Encounter: Payer: Medicare Other | Admitting: *Deleted

## 2017-06-01 DIAGNOSIS — J8410 Pulmonary fibrosis, unspecified: Secondary | ICD-10-CM | POA: Diagnosis not present

## 2017-06-01 NOTE — Progress Notes (Signed)
Daily Session Note  Patient Details  Name: Anne [name] MRN: [id number] Date of Birth: [birth year] Referring Provider:     Pulmonary Rehab from 04/23/2017 in [facility]  Referring Provider  Merton Border MD      Encounter Date: 06/01/2017  Check In: Session Check In - 06/01/17 1036      Check-In   Location  [facility]    Staff Present  Renita Papa, RN Vickki Hearing, BA, ACSM CEP, Exercise Physiologist;Mary Kellie Shropshire, RN, BSN, MA    Supervising physician immediately available to respond to emergencies  [facility] immediately available ER MD    Physician(s)   Dr. Reita Cliche and Archie Balboa    Medication changes reported      No    Fall or balance concerns reported     No    Warm-up and Cool-down  Performed as group-led instruction    Resistance Training Performed  Yes    VAD Patient?  No      Pain Assessment   Currently in Pain?  No/denies          Social History   Tobacco Use  Smoking Status Never Smoker  Smokeless Tobacco Never Used    Goals Met:  Proper associated with RPD/PD & O2 Sat Independence with exercise equipment Using PLB without cueing & demonstrates good technique Exercise tolerated well Strength training completed today  Goals Unmet:  Not Applicable  Comments: Pt able to follow exercise prescription today without complaint.  Will continue to monitor for progression.    Dr. Emily Filbert is Medical Director for [facility] and [facility].

## 2017-06-04 ENCOUNTER — Telehealth: Payer: Self-pay | Admitting: Pulmonary Disease

## 2017-06-04 ENCOUNTER — Telehealth: Payer: Self-pay | Admitting: *Deleted

## 2017-06-04 DIAGNOSIS — J8410 Pulmonary fibrosis, unspecified: Secondary | ICD-10-CM | POA: Diagnosis not present

## 2017-06-04 MED ORDER — PREDNISONE 10 MG PO TABS: 10.0000 mg | ORAL_TABLET | Freq: Every day | ORAL | 0 refills | Status: DC

## 2017-06-04 NOTE — Telephone Encounter (Signed)
Patient came by heading to cardio/pulm rehab Is having congestion and drainage that is getting into chest Sinuses making it hard to breathe, sneezing constantly and having headaches Patient would like to know if there is something that can be prescribed to help Please call to discuss

## 2017-06-04 NOTE — Progress Notes (Signed)
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  acetaminophen (TYLENOL) 500 MG tablet, Take 500 mg by mouth daily as needed for moderate pain or headache., Disp: , Rfl:  .  albuterol (PROVENTIL HFA;VENTOLIN HFA) 108 (90 Base) MCG/ACT inhaler, Inhale 2 puffs into the lungs every 6 (six) hours as needed for wheezing or shortness of breath., Disp: 1 Inhaler, Rfl: 5 .  ALPRAZolam (XANAX) 0.25 MG tablet, TAKE ONE-HALF TABLET BY MOUTH TWICE DAILY AS NEEDED, Disp: 30 tablet, Rfl: 1 .  amoxicillin (AMOXIL) 500 MG capsule, Take 1 capsule (500 mg total) by mouth 2 (two) times daily., Disp: 14 capsule, Rfl: 0 .  cetirizine (ZYRTEC) 10 MG tablet, TAKE ONE TABLET EVERY DAY, Disp: 30 tablet, Rfl: 5 .  clopidogrel (PLAVIX) 75 MG tablet, TAKE ONE TABLET BY MOUTH EVERY MORNING, Disp: 90 tablet, Rfl: 1 .  doxycycline (VIBRA-TABS) 100 MG tablet, Take 1 tablet (100 mg total) by mouth 2 (two) times daily., Disp: 10 tablet, Rfl: 0 .  EPINEPHrine (EPIPEN 2-PAK) 0.3 mg/0.3 mL IJ SOAJ injection, Inject 0.3 mLs (0.3 mg total) into the muscle once. (Patient taking differently: Inject 0.3 mg once as needed into the muscle (FOR AN ALLERGIC REACTION). ), Disp: 1 Device, Rfl: 0 .  estradiol (ESTRACE) 1 MG tablet, TAKE ONE TABLET BY MOUTH EVERY DAY, Disp: 90 tablet, Rfl: 1 .  fluticasone (FLONASE) 50 MCG/ACT nasal spray, PLACE 2 SPRAYS INTO BOTH NOSTRILS DAILY. (Patient taking differently: Place 2 sprays into both nostrils daily  as needed for allergies. ), Disp: 16 g, Rfl: 2 .  furosemide (LASIX) 20 MG tablet, TAKE ONE TABLET EVERY DAY AS NEEDED, Disp: 90 tablet, Rfl: 1 .  hydroxypropyl methylcellulose (ISOPTO TEARS) 2.5 % ophthalmic solution, Place 1 drop into both eyes 3 (three) times daily as needed for dry eyes., Disp: , Rfl:  .  ipratropium-albuterol (DUONEB) 0.5-2.5 (3) MG/3ML SOLN, Take 3 mLs by nebulization every 4 (four) hours as needed. Dx 496, Disp: 120 mL, Rfl: 2 .  linaclotide (LINZESS) 72 MCG capsule, Take 1 capsule (72 mcg total) by mouth daily before breakfast., Disp: 30 capsule, Rfl: 3 .  Liniments (SALONPAS PAIN RELIEF PATCH EX), Apply 1-2 patches daily as needed topically (FOR SHOULDER PAIN). , Disp: , Rfl:  .  methocarbamol (ROBAXIN) 500 MG tablet, Take 1 tablet (500 mg total) by mouth every 6 (six) hours as needed for muscle spasms., Disp: 60 tablet, Rfl: 0 .  montelukast (SINGULAIR) 10 MG tablet, Take 10 mg by mouth at bedtime. , Disp: , Rfl:  .  mupirocin ointment (BACTROBAN) 2 %, Apply 1 application daily as needed topically (FOR [facility] SITE IRRITATION). , Disp: , Rfl:  .  oxyCODONE (OXY IR/ROXICODONE) 5 MG immediate release tablet, Take 1 tablet (5 mg total) by mouth every 6 (six) hours as needed for breakthrough pain., Disp: 30 tablet, Rfl: 0 .  pantoprazole (PROTONIX) 40 MG tablet, Take 1 tablet (40 mg total) by mouth daily., Disp: 90 tablet, Rfl: 0 .  pantoprazole (PROTONIX) 40 MG tablet, TAKE ONE TABLET EVERY DAY, Disp: 90 tablet, Rfl: 1 .  potassium chloride 20 MEQ/15ML (10%) SOLN, Take 15 mLs (20 mEq total) by mouth daily as needed (cramps). (Patient taking differently: Take 20 mEq daily as needed by mouth (for cramping). ), Disp: 240 mL, Rfl: 0 .  predniSONE (DELTASONE) 10 MG tablet, Take 40 mg by mouth on day 1, then taper 10 mg daily until gone, Disp: 10 tablet, Rfl: 0 .  Probiotic Product (PROBIOTIC PO), Take 1 capsule daily as needed by mouth (FOR G.I. DISTRESS). , Disp: , Rfl:  .  sodium  chloride (OCEAN) 0.65 % SOLN nasal spray, Place 1 spray into both nostrils every 4 (four) hours as needed for congestion. , Disp: , Rfl:  .  traMADol (ULTRAM) 50 MG tablet, 1/2-1 tid prn, Disp: , Rfl:  .  zolmitriptan (ZOMIG-ZMT) 5 MG disintegrating tablet, Take 1 tablet (5 mg total) by mouth daily as needed for migraine., Disp: 10 tablet, Rfl: 0  Current Facility-Administered Medications:  .  ipratropium-albuterol (DUONEB) 0.5-2.5 (3) MG/3ML nebulizer solution 3 mL, 3 mL, Nebulization, Q6H, Burnard Hawthorne, FNP, 3 mL at 05/18/17 1437  Past Medical History: Past Medical History:  Diagnosis Date  . Allergic rhinitis   . Anemia   . Asthma   . Complication of anesthesia    Breathing problems upon waking up. 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  . Compressed cervical disc   . COPD (chronic obstructive pulmonary disease) ([place])   . CVA (cerebral infarction)   . Dyspnea   . Esophageal dysmotility   . Heart murmur    as child  . History of hiatal hernia   . IBS (irritable bowel syndrome)   . Migraine   . PICC (peripherally inserted central catheter) removal 02/20/2017  . PONV (postoperative nausea and vomiting)   . Problems with swallowing    intermittently  . Pulmonary fibrosis ([place])   . Shingles   . Stroke [facility])    slurred speech, drawn face, imaging normal, occurred twice, [facility]-"TIA" if antything" 02/20/17-no residual effects  . Tracheostomy in place [facility])   . Vocal cord paresis     Tobacco Use: Social History   Tobacco Use  Smoking Status Never Smoker  Smokeless Tobacco Never Used    Labs: Recent Review Flowsheet Data    Labs for ITP Cardiac and Pulmonary Rehab Latest Ref Rng & Units 12/13/2012 04/30/2013 12/13/2016   Cholestrol 0 - 200 mg/dL 201(H) - 215(H)   LDLCALC 0 - 99 mg/dL - - 64   LDLDIRECT mg/dL 92.7 - -   HDL >39.00 mg/dL 91.50 - 122.30   Trlycerides 0.0 - 149.0 mg/dL 101.0 - 140.0   Hemoglobin A1c 4.6 - 6.5 % - 5.9(H) 5.4        Pulmonary Assessment Scores: Pulmonary Assessment Scores    Row Name 04/23/17 1422         ADL UCSD   ADL Phase  Entry     SOB Score total  28     Rest  0     Walk  2     Stairs  4     Bath  0     Dress  0     Shop  1       CAT Score   CAT Score  14       mMRC Score   mMRC Score  2        Pulmonary  Function Assessment: Pulmonary Function Assessment - 04/23/17 1426      Breath   Bilateral Breath Sounds  Clear;Decreased    Shortness of Breath  Yes more on long walks she gets short of breath       Exercise Target Goals:    Exercise Program Goal: Individual exercise prescription set using results from initial 6 min walk test and THRR while considering  patient's activity barriers and safety.    Exercise Prescription Goal: Initial exercise prescription builds to 30-45 minutes a day of aerobic activity, 2-3 days per week.  Home exercise guidelines will be given to patient during program as part of exercise prescription that the participant will acknowledge.  Activity Barriers & Risk Stratification: Activity Barriers & Cardiac Risk Stratification - 04/23/17 1540      Activity Barriers & Cardiac Risk Stratification   Activity Barriers  Neck/Spine Problems;Deconditioning;Muscular Weakness;Shortness of Breath;Other (comment)    Comments  cervical spine fusions and hardwares (surgey x3), neuropathy in hands       6 Minute Walk: 6 Minute Walk    Row Name 04/23/17 1535         6 Minute Walk   Phase  Initial     Distance  1380 feet     Walk Time  6 minutes     # of Rest Breaks  0     MPH  2.61     METS  3.69     RPE  15     Perceived Dyspnea   3.5     VO2 Peak  12.9     Symptoms  Yes (comment)     Comments  wheezing with breathing and SOB     Resting HR  64 bpm     Resting BP  132/64     Resting Oxygen Saturation   98 %     Exercise Oxygen Saturation  during 6 min walk  95 %     Max Ex. HR  64 bpm     Max Ex. BP  138/64     2 Minute Post BP   126/60       Interval HR   1 Minute HR  96     2 Minute HR  102     3 Minute HR  103     4 Minute HR  99     5 Minute HR  104     6 Minute HR  102     2 Minute Post HR  75     Interval Heart Rate?  Yes       Interval Oxygen   Interval Oxygen?  Yes     Baseline Oxygen Saturation %  98 %     1 Minute Oxygen Saturation %  - equipment error     1 Minute Liters of Oxygen  0 L Room Air     2 Minute Oxygen Saturation %  98 %     2 Minute Liters of Oxygen  0 L     3 Minute Oxygen Saturation %  97 %     3 Minute Liters of Oxygen  0 L     4 Minute Oxygen Saturation %  95 %     4 Minute Liters of Oxygen  0 L     5 Minute Oxygen Saturation %  100 %     5 Minute Liters of Oxygen  0 L     6 Minute Oxygen Saturation %  97 %  6 Minute Liters of Oxygen  0 L     2 Minute Post Oxygen Saturation %  100 %     2 Minute Post Liters of Oxygen  0 L       Oxygen Initial Assessment: Oxygen Initial Assessment - 04/23/17 1436      Home Oxygen   Home Oxygen Device  None    Sleep Oxygen Prescription  None    Home Exercise Oxygen Prescription  None    Home at Rest Exercise Oxygen Prescription  None      Initial 6 min Walk   Oxygen Used  None      Program Oxygen Prescription   Program Oxygen Prescription  None      Intervention   Short Term Goals  To learn and demonstrate proper pursed lip breathing techniques or other breathing techniques.;To learn and understand importance of monitoring SPO2 with pulse oximeter and demonstrate accurate use of the pulse oximeter.;To learn and demonstrate proper use of respiratory medications;To learn and understand importance of maintaining oxygen saturations>88% she uses her albuterol inhaler or nebulizer as needed.    Long  Term Goals  Verbalizes importance of monitoring SPO2 with pulse oximeter and return demonstration;Maintenance of O2 saturations>88%;Exhibits proper breathing techniques, such as pursed lip breathing or other method taught during program  session;Compliance with respiratory medication;Demonstrates proper use of MDI's       Oxygen Re-Evaluation: Oxygen Re-Evaluation    Row Name 04/27/17 1014 05/16/17 1038           Program Oxygen Prescription   Program Oxygen Prescription  -  None        Home Oxygen   Home Oxygen Device  -  None      Sleep Oxygen Prescription  -  None      Home Exercise Oxygen Prescription  -  None      Home at Rest Exercise Oxygen Prescription  -  None        Goals/Expected Outcomes   Short Term Goals  To learn and demonstrate proper pursed lip breathing techniques or other breathing techniques.;To learn and understand importance of monitoring SPO2 with pulse oximeter and demonstrate accurate use of the pulse oximeter.;To learn and demonstrate proper use of respiratory medications;To learn and understand importance of maintaining oxygen saturations>88%  To learn and demonstrate proper pursed lip breathing techniques or other breathing techniques.;To learn and understand importance of monitoring SPO2 with pulse oximeter and demonstrate accurate use of the pulse oximeter.;To learn and demonstrate proper use of respiratory medications;To learn and understand importance of maintaining oxygen saturations>88%      Long  Term Goals  Verbalizes importance of monitoring SPO2 with pulse oximeter and return demonstration;Maintenance of O2 saturations>88%;Exhibits proper breathing techniques, such as pursed lip breathing or other method taught during program session;Compliance with respiratory medication;Demonstrates proper use of MDI's  Verbalizes importance of monitoring SPO2 with pulse oximeter and return demonstration;Maintenance of O2 saturations>88%;Exhibits proper breathing techniques, such as pursed lip breathing or other method taught during program session;Compliance with respiratory medication;Demonstrates proper use of MDI's      Comments  Reviewed PLB technique with pt.  Talked about how it work and it's  important to maintaining his exercise saturations.    Anne [name] uses her rescue inhaler and nebulizer as needed. She does not have a pulse oximeter to check her oxygen at home. Informed patient where to get one and about how much they cost.  Patient verbalizes understanding.      Goals/Expected  Outcomes  Short: Become more profiecient at using PLB.   Long: Become independent at using PLB.  Short: obtain a pulse oximeter for home. Long: monitor oxygen at home independently.         Oxygen Discharge (Final Oxygen Re-Evaluation): Oxygen Re-Evaluation - 05/16/17 1038      Program Oxygen Prescription   Program Oxygen Prescription  None      Home Oxygen   Home Oxygen Device  None    Sleep Oxygen Prescription  None    Home Exercise Oxygen Prescription  None    Home at Rest Exercise Oxygen Prescription  None      Goals/Expected Outcomes   Short Term Goals  To learn and demonstrate proper pursed lip breathing techniques or other breathing techniques.;To learn and understand importance of monitoring SPO2 with pulse oximeter and demonstrate accurate use of the pulse oximeter.;To learn and demonstrate proper use of respiratory medications;To learn and understand importance of maintaining oxygen saturations>88%    Long  Term Goals  Verbalizes importance of monitoring SPO2 with pulse oximeter and return demonstration;Maintenance of O2 saturations>88%;Exhibits proper breathing techniques, such as pursed lip breathing or other method taught during program session;Compliance with respiratory medication;Demonstrates proper use of MDI's    Comments  Anne [name] uses her rescue inhaler and nebulizer as needed. She does not have a pulse oximeter to check her oxygen at home. Informed patient where to get one and about how much they cost.  Patient verbalizes understanding.    Goals/Expected Outcomes  Short: obtain a pulse oximeter for home. Long: monitor oxygen at home independently.       Initial Exercise  Prescription: Initial Exercise Prescription - 04/23/17 1500      Date of Initial Exercise RX and Referring Provider   Date  04/23/17    Referring Provider  Merton Border MD      Treadmill   MPH  2.6    Grade  1    Minutes  15    METs  3.35      NuStep   Level  2    SPM  80    Minutes  15    METs  3      REL-XR   Level  1    Speed  50    Minutes  15    METs  3      Prescription Details   Frequency (times per week)  3    Duration  Progress to 45 minutes of aerobic exercise without signs/symptoms of physical distress      Intensity   THRR 40-80% of Max Heartrate  100-135    Ratings of Perceived Exertion  11-13    Perceived Dyspnea  0-4      Progression   Progression  Continue to progress workloads to maintain intensity without signs/symptoms of physical distress.      Resistance Training   Training Prescription  Yes    Weight  3 lbs    Reps  10-15       Perform Capillary Blood Glucose checks as needed.  Exercise Prescription Changes: Exercise Prescription Changes    Row Name 04/23/17 1500 05/02/17 1100 05/09/17 1200 05/09/17 1300 05/23/17 1500     Response to Exercise   Blood Pressure (Admit)  132/64  -  124/64  -  106/64   Blood Pressure (Exercise)  138/64  -  110/60  -  -   Blood Pressure (Exit)  126/60  -  96/58  -  104/64  Heart Rate (Admit)  64 bpm  -  81 bpm  -  87 bpm   Heart Rate (Exercise)  104 bpm  -  86 bpm  -  107 bpm   Heart Rate (Exit)  75 bpm  -  80 bpm  -  61 bpm   Oxygen Saturation (Admit)  98 %  -  94 %  -  96 %   Oxygen Saturation (Exercise)  95 %  -  93 %  -  90 %   Oxygen Saturation (Exit)  100 %  -  98 %  -  97 %   Rating of Perceived Exertion (Exercise)  15  -  14  -  13   Perceived Dyspnea (Exercise)  3.5  -  2  -  3   Symptoms  wheezing with breathing, SOB  -  -  -  -   Comments  walk test results  -  -  -  -   Duration  -  -  Continue with 45 min of aerobic exercise without signs/symptoms of physical distress.  -  Continue with  45 min of aerobic exercise without signs/symptoms of physical distress.   Intensity  -  -  THRR unchanged  -  THRR unchanged     Progression   Progression  -  -  Continue to progress workloads to maintain intensity without signs/symptoms of physical distress.  -  Continue to progress workloads to maintain intensity without signs/symptoms of physical distress.   Average METs  -  -  4.15  -  3.05     Resistance Training   Training Prescription  -  -  Yes  -  Yes   Weight  -  -  3 lb  -  3 lb   Reps  -  -  10-15  -  10-15     Interval Training   Interval Training  -  -  No  -  No     Treadmill   MPH  -  -  -  -  3   Grade  -  -  -  -  1   Minutes  -  -  -  -  15   METs  -  -  -  -  3.71     NuStep   Level  -  -  2  -  2   SPM  -  -  80  -  80   Minutes  -  -  15  -  15   METs  -  -  2.4  -  2.4     REL-XR   Level  -  -  1  -  -   Speed  -  -  50  -  -   Minutes  -  -  15  -  -   METs  -  -  5.2  -  -     Home Exercise Plan   Plans to continue exercise at  -  [employer] (comment) [place]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  -  Add 2 additional days to program exercise sessions.  Add 2 additional days to program exercise sessions.   Initial Home Exercises Provided  -  05/02/17  -  05/02/17  05/02/17      Exercise Comments: Exercise Comments    Row Name 04/27/17 1013           Exercise Comments  First full day of exercise!  Patient was oriented to gym and equipment including functions, settings, policies, and procedures.  Patient's individual exercise prescription and treatment plan were reviewed.  All starting workloads were established based on the results of the 6 minute walk test done at initial orientation visit.  The plan for exercise progression was also introduced and progression will be customized based on  patient's performance and goals.          Exercise Goals and Review: Exercise Goals    Row Name 04/23/17 1542             Exercise Goals   Increase Physical Activity  Yes       Intervention  Provide advice, education, support and counseling about physical activity/exercise needs.;Develop an individualized exercise prescription for aerobic and resistive training based on initial evaluation findings, risk stratification, comorbidities and participant's personal goals.       Expected Outcomes  Short Term: Attend rehab on a regular basis to increase amount of physical activity.;Long Term: Add in home exercise to make exercise part of routine and to increase amount of physical activity.;Long Term: Exercising regularly at least 3-5 days a week.       Increase Strength and Stamina  Yes       Intervention  Provide advice, education, support and counseling about physical activity/exercise needs.;Develop an individualized exercise prescription for aerobic and resistive training based on initial evaluation findings, risk stratification, comorbidities and participant's personal goals.       Expected Outcomes  Short Term: Increase workloads from initial exercise prescription for resistance, speed, and METs.;Short Term: Perform resistance training exercises routinely during rehab and add in resistance training at home;Long Term: Improve cardiorespiratory fitness, muscular endurance and strength as measured by increased METs and functional capacity (6MWT)       Able to understand and use rate of perceived exertion (RPE) scale  Yes       Intervention  Provide education and explanation on how to use RPE scale       Expected Outcomes  Short Term: Able to use RPE daily in rehab to express subjective intensity level;Long Term:  Able to use RPE to guide intensity level when exercising independently       Able to understand and use Dyspnea scale  Yes       Intervention  Provide education and explanation on how to  use Dyspnea scale       Expected Outcomes  Short Term: Able to use Dyspnea scale daily in rehab to express subjective sense of shortness of breath during exertion;Long Term: Able to use Dyspnea scale to guide intensity level when exercising independently       Knowledge and understanding of Target Heart Rate Range (THRR)  Yes       Intervention  Provide education and explanation of THRR including how the numbers were predicted and where they are located for reference       Expected Outcomes  Short Term: Able to state/look up THRR;Short Term: Able to use daily as guideline for intensity in rehab;Long Term: Able to use THRR to govern intensity when exercising independently       Able to check pulse independently  Yes       Intervention  Provide education and demonstration on how to check  pulse in carotid and radial arteries.;Review the importance of being able to check your own pulse for safety during independent exercise       Expected Outcomes  Short Term: Able to explain why pulse checking is important during independent exercise;Long Term: Able to check pulse independently and accurately       Understanding of Exercise Prescription  Yes       Intervention  Provide education, explanation, and written materials on patient's individual exercise prescription       Expected Outcomes  Short Term: Able to explain program exercise prescription;Long Term: Able to explain home exercise prescription to exercise independently          Exercise Goals Re-Evaluation : Exercise Goals Re-Evaluation    Row Name 04/27/17 1014 05/02/17 1111 05/09/17 1309 05/23/17 1505       Exercise Goal Re-Evaluation   Exercise Goals Review  Understanding of Exercise Prescription;Able to understand and use Dyspnea scale;Knowledge and understanding of Target Heart Rate Range (THRR);Able to understand and use rate of perceived exertion (RPE) scale  Increase Physical Activity;Increase Strength and Stamina;Able to understand and use  rate of perceived exertion (RPE) scale;Able to check pulse independently;Knowledge and understanding of Target Heart Rate Range (THRR);Able to understand and use Dyspnea scale  Increase Physical Activity;Increase Strength and Stamina;Able to understand and use rate of perceived exertion (RPE) scale;Knowledge and understanding of Target Heart Rate Range (THRR)  Increase Physical Activity;Able to understand and use rate of perceived exertion (RPE) scale;Knowledge and understanding of Target Heart Rate Range (THRR);Increase Strength and Stamina;Able to understand and use Dyspnea scale    Comments  Reviewed RPE scale, THR and program prescription with pt today.  Pt voiced understanding and was given a copy of goals to take home.   Reviewed home exercise - Pt attends dancercise classses and walk.  She also has clearance from her Dr for weights no more than 10-20 lb (neck surgery) She also walks at home  Pt is tolerating exercise well.  She has no restriction sother than her tolerance for strength training.     Anne [name] is progressing well with exercise.  Staff will continue to monitor progress.    Expected Outcomes  Short: Use RPE daily to regulate intensity.  Long: Follow program prescription in THR.  Short - PT will continue dance class and walk on other day at home  Long - Pt will maintain exercise on her own  Short - Anne [name] will continue to attend and particpate in her Dancercise class.  Long - Anne [name] will improve upper body strength and cardiovascular endurance  Short - Anne [name] wil continue to attend Long - Anne [name] will build overall MET level       Discharge Exercise Prescription (Final Exercise Prescription Changes): Exercise Prescription Changes - 05/23/17 1500      Response to Exercise   Blood Pressure (Admit)  106/64    Blood Pressure (Exit)  104/64    Heart Rate (Admit)  87 bpm    Heart Rate (Exercise)  107 bpm    Heart Rate (Exit)  61 bpm    Oxygen Saturation (Admit)  96 %    Oxygen  Saturation (Exercise)  90 %    Oxygen Saturation (Exit)  97 %    Rating of Perceived Exertion (Exercise)  13    Perceived Dyspnea (Exercise)  3    Duration  Continue with 45 min of aerobic exercise without signs/symptoms of physical distress.    Intensity  THRR unchanged  Progression   Progression  Continue to progress workloads to maintain intensity without signs/symptoms of physical distress.    Average METs  3.05      Resistance Training   Training Prescription  Yes    Weight  3 lb    Reps  10-15      Interval Training   Interval Training  No      Treadmill   MPH  3    Grade  1    Minutes  15    METs  3.71      NuStep   Level  2    SPM  80    Minutes  15    METs  2.4      Home Exercise Plan   Plans to continue exercise at  [employer] (comment) [place] and walk    Frequency  Add 2 additional days to program exercise sessions.    Initial Home Exercises Provided  05/02/17       Nutrition:  Target Goals: Understanding of nutrition guidelines, daily intake of sodium <1568m, cholesterol <2044m calories 30% from fat and 7% or less from saturated fats, daily to have 5 or more servings of fruits and vegetables.  Biometrics: Pre Biometrics - 04/23/17 1543      Pre Biometrics   Height  5' 3.2" (1.605 m)    Weight  105 lb 1.6 oz (47.7 kg)    Waist Circumference  24 inches    Hip Circumference  34.5 inches    Waist to Hip Ratio  0.7 %    BMI (Calculated)  18.51        Nutrition Therapy Plan and Nutrition Goals: Nutrition Therapy & Goals - 04/23/17 1420      Personal Nutrition Goals   Comments  She cannot eat alot at one time. Some things are hard for her to swallow. She has learned how to eat over the years for her to be comfortable.      Intervention Plan   Intervention  Prescribe, educate and counsel regarding individualized specific dietary modifications aiming towards targeted core components such as weight, hypertension, lipid management,  diabetes, heart failure and other comorbidities.;Nutrition handout(s) given to patient.    Expected Outcomes  Short Term Goal: Understand basic principles of dietary content, such as calories, fat, sodium, cholesterol and nutrients.;Long Term Goal: Adherence to prescribed nutrition plan.       Nutrition Assessments: Nutrition Assessments - 04/23/17 1441      MEDFICTS Scores   Pre Score  28       Nutrition Goals Re-Evaluation: Nutrition Goals Re-Evaluation    Row Name 05/16/17 1043             Goals   Current Weight  102 lb (46.3 kg)       Nutrition Goal  Increase enegery, maintain or gain a couple pounds.       Comment  She has been put on protien, low carbs and low sugar diet. Her wound doctor has talked to her about her diet habits. It is hard for her to eat a good amount of food due to her trach and the muscles in her neck for swallowing.  She has met with several dieticians and she is not interseted in another dietician appointment.        Expected Outcome  Short: increase food intake. Long: maintain weight or gain a few pounds.          Nutrition Goals Discharge (Final Nutrition Goals  Re-Evaluation): Nutrition Goals Re-Evaluation - 05/16/17 1043      Goals   Current Weight  102 lb (46.3 kg)    Nutrition Goal  Increase enegery, maintain or gain a couple pounds.    Comment  She has been put on protien, low carbs and low sugar diet. Her wound doctor has talked to her about her diet habits. It is hard for her to eat a good amount of food due to her trach and the muscles in her neck for swallowing.  She has met with several dieticians and she is not interseted in another dietician appointment.     Expected Outcome  Short: increase food intake. Long: maintain weight or gain a few pounds.       Psychosocial: Target Goals: Acknowledge presence or absence of significant depression and/or stress, maximize coping skills, provide positive support system. Participant is able to  verbalize types and ability to use techniques and skills needed for reducing stress and depression.   Initial Review & Psychosocial Screening: Initial Psych Review & Screening - 04/23/17 1418      Initial Review   Current issues with  None Identified      Family Dynamics   Good Support System?  Yes    Comments  She had a trach for 20 years and know what is normal with her breathing. Her husband and her 4 children are great for support.      Barriers   Psychosocial barriers to participate in program  There are no identifiable barriers or psychosocial needs.      Screening Interventions   Interventions  Encouraged to exercise;To provide support and resources with identified psychosocial needs;Provide feedback about the scores to participant    Expected Outcomes  Short Term goal: Utilizing psychosocial counselor, staff and physician to assist with identification of specific Stressors or current issues interfering with healing process. Setting desired goal for each stressor or current issue identified.;Long Term Goal: Stressors or current issues are controlled or eliminated.;Long Term goal: The participant improves quality of Life and PHQ9 Scores as seen by post scores and/or verbalization of changes;Short Term goal: Identification and review with participant of any Quality of Life or Depression concerns found by scoring the questionnaire.       Quality of Life Scores:  Scores of 19 and below usually indicate a poorer quality of life in these areas.  A difference of  2-3 points is a clinically meaningful difference.  A difference of 2-3 points in the total score of the Quality of Life Index has been associated with significant improvement in overall quality of life, self-image, physical symptoms, and general health in studies assessing change in quality of life.  PHQ-9: Recent Review Flowsheet Data    Depression screen [facility] 2/9 04/23/2017 12/13/2016 10/28/2015 11/20/2014   Decreased Interest 0 0 0  0   Down, Depressed, Hopeless 0 0 0 0   PHQ - 2 Score 0 0 0 0   Altered sleeping 3 - - -   Tired, decreased energy 1 - - -   Change in appetite 0 - - -   Feeling bad or failure about yourself  0 - - -   Trouble concentrating 0 - - -   Moving slowly or fidgety/restless 0 - - -   Suicidal thoughts 0 - - -   PHQ-9 Score 4 - - -   Difficult doing work/chores Not difficult at all - - -     Interpretation of Total Score  Total Score Depression Severity:  1-4 = Minimal depression, 5-9 = Mild depression, 10-14 = Moderate depression, 15-19 = Moderately severe depression, 20-27 = Severe depression   Psychosocial Evaluation and Intervention: Psychosocial Evaluation - 05/02/17 1142      Psychosocial Evaluation & Interventions   Interventions  Encouraged to exercise with the program and follow exercise prescription;Stress management education;Relaxation education    Comments  Counselor met with Ms. Karis Gatha Mayer) today for initial psychosocial evaluation.  She is a 67 year old who has asthma and a Trach inserted in [place] (several months ago.  She has a strong support system with a spouse and adult children who live close by. Anne [name] also is actively involved in her local church and volunteers at [employer].  She reports not sleeping well since the surgery and difficulty swallowing - which impact her appetite.  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  She states she is typically in a positive mood most of the time and has minimal stress other than her health and a brother who is in a treatment facility for alcohol out of state.  Jazlynn has goals to increase her stamina and strength while in this class and get back to consistently exercising.  Staff will follow.    Expected Outcomes  Jermya will benefit from consistent exercise to achieve her stated goals.  The educational and  psychoeducational components of this program will be helpful in understanding her condition and coping more positively.  Monitor sleep for improved health.    Continue Psychosocial Services   Follow up required by staff       Psychosocial Re-Evaluation: Psychosocial Re-Evaluation    Anne [name] Name 05/16/17 1047             Psychosocial Re-Evaluation   Current issues with  Current Sleep Concerns       Comments  Her sleep has been difficult due to her neck hurting. She is talking to her doctor about her muscle spasms in her neck. She can turn her neck to the right but cannot turn her neck all the way to the left.       Expected Outcomes  Short: talk with her doctor about her sleep. Long: maintain good sleep independently       Interventions  Encouraged to attend Pulmonary Rehabilitation for the exercise       Continue Psychosocial Services   Follow up required by staff          Psychosocial Discharge (Final Psychosocial Re-Evaluation): Psychosocial Re-Evaluation - 05/16/17 1047      Psychosocial Re-Evaluation   Current issues with  Current Sleep Concerns    Comments  Her sleep has been difficult due to her neck hurting. She is talking to her doctor about her muscle spasms in her neck. She can turn her neck to the right but cannot turn her neck all the way to the left.    Expected Outcomes  Short: talk with her doctor about her sleep. Long: maintain good sleep independently    Interventions  Encouraged to attend Pulmonary Rehabilitation for the exercise    Continue Psychosocial Services   Follow up required by staff       Education: Education Goals: Education classes will be provided on a weekly basis, covering required topics. Participant will state understanding/return demonstration of topics presented.  Learning Barriers/Preferences: Learning Barriers/Preferences - 04/23/17 1431  Learning Barriers/Preferences   Learning Barriers  Sight wears glasses    Learning Preferences   None       Education Topics:  Initial Evaluation Education: - Verbal, written and demonstration of respiratory meds, oximetry and breathing techniques. Instruction on use of nebulizers and MDIs and importance of monitoring MDI activations.   Pulmonary Rehab from 05/30/2017 in [facility] Cardiac and Pulmonary Rehab  Date  04/23/17  Educator  [facility]  Instruction Review Code  1- Verbalizes Understanding      General Nutrition Guidelines/Fats and Fiber: -Group instruction provided by verbal, written material, models and posters to present the general guidelines for heart healthy nutrition. Gives an explanation and review of dietary fats and fiber.   Controlling Sodium/Reading Food Labels: -Group verbal and written material supporting the discussion of sodium use in heart healthy nutrition. Review and explanation with models, verbal and written materials for utilization of the food label.   Pulmonary Rehab from 05/30/2017 in [facility] Cardiac and Pulmonary Rehab  Date  04/30/17  Educator  [facility]  Instruction Review Code  1- Verbalizes Understanding      Exercise Physiology & General Exercise Guidelines: - Group verbal and written instruction with models to review the exercise physiology of the cardiovascular system and associated critical values. Provides general exercise guidelines with specific guidelines to those with heart or lung disease.    Aerobic Exercise & Resistance Training: - Gives group verbal and written instruction on the various components of exercise. Focuses on aerobic and resistive training programs and the benefits of this training and how to safely progress through these programs.   Flexibility, Balance, Mind/Body Relaxation: Provides group verbal/written instruction on the benefits of flexibility and balance training, including mind/body exercise modes such as yoga, pilates and tai chi.  Demonstration and skill practice provided.   Pulmonary Rehab from 05/30/2017 in [facility] Cardiac and  Pulmonary Rehab  Date  05/09/17  Educator  [facility]  Instruction Review Code  1- Verbalizes Understanding      Stress and Anxiety: - Provides group verbal and written instruction about the health risks of elevated stress and causes of high stress.  Discuss the correlation between heart/lung disease and anxiety and treatment options. Review healthy ways to manage with stress and anxiety.   Pulmonary Rehab from 05/30/2017 in [facility] Cardiac and Pulmonary Rehab  Date  05/30/17  Educator  [facility]  Instruction Review Code  1- Verbalizes Understanding      Depression: - Provides group verbal and written instruction on the correlation between heart/lung disease and depressed mood, treatment options, and the stigmas associated with seeking treatment.   Pulmonary Rehab from 05/30/2017 in [facility] Cardiac and Pulmonary Rehab  Date  05/16/17  Educator  [facility]  Instruction Review Code  1- Verbalizes Understanding      Exercise & Equipment Safety: - Individual verbal instruction and demonstration of equipment use and safety with use of the equipment.   Pulmonary Rehab from 05/30/2017 in [facility] Cardiac and Pulmonary Rehab  Date  04/23/17  Educator  [facility]  Instruction Review Code  1- Verbalizes Understanding      Infection Prevention: - Provides verbal and written material to individual with discussion of infection control including proper hand washing and proper equipment cleaning during exercise session.   Pulmonary Rehab from 05/30/2017 in [facility] Cardiac and Pulmonary Rehab  Date  04/23/17  Educator  [facility]  Instruction Review Code  1- Verbalizes Understanding      Falls Prevention: - Provides verbal and written material to individual  with discussion of falls prevention and safety.   Pulmonary Rehab from 05/30/2017 in [facility] Cardiac and Pulmonary Rehab  Date  04/23/17  Educator  [facility]  Instruction Review Code  1- Verbalizes Understanding      Diabetes: - Individual verbal and written instruction to review signs/symptoms of  diabetes, desired ranges of glucose level fasting, after meals and with exercise. Advice that pre and post exercise glucose checks will be done for 3 sessions at entry of program.   Chronic Lung Diseases: - Group verbal and written instruction to review updates, respiratory medications, advancements in procedures and treatments. Discuss use of supplemental oxygen including available portable oxygen systems, continuous and intermittent flow rates, concentrators, personal use and safety guidelines. Review proper use of inhaler and spacers. Provide informative websites for self-education.    Energy Conservation: - Provide group verbal and written instruction for methods to conserve energy, plan and organize activities. Instruct on pacing techniques, use of adaptive equipment and posture/positioning to relieve shortness of breath.   Pulmonary Rehab from 05/30/2017 in [facility] Cardiac and Pulmonary Rehab  Date  05/23/17  Educator  [facility]  Instruction Review Code  1- Verbalizes Understanding      Triggers and Exacerbations: - Group verbal and written instruction to review types of environmental triggers and ways to prevent exacerbations. Discuss weather changes, air quality and the benefits of nasal washing. Review warning signs and symptoms to help prevent infections. Discuss techniques for effective airway clearance, coughing, and vibrations.   AED/CPR: - Group verbal and written instruction with the use of models to demonstrate the basic use of the AED with the basic ABC's of resuscitation.   Pulmonary Rehab from 05/30/2017 in [facility] Cardiac and Pulmonary Rehab  Date  05/25/17  Educator  [facility]  Instruction Review Code  1- Actuary and Physiology of the Lungs: - Group verbal and written instruction with the use of models to provide basic lung anatomy and physiology related to function, structure and complications of lung disease.   Anatomy & Physiology of the Heart: - Group  verbal and written instruction and models provide basic cardiac anatomy and physiology, with the coronary electrical and arterial systems. Review of Valvular disease and Heart Failure   Cardiac Medications: - Group verbal and written instruction to review commonly prescribed medications for heart disease. Reviews the medication, class of the drug, and side effects.   Pulmonary Rehab from 05/30/2017 in [facility] Cardiac and Pulmonary Rehab  Date  05/11/17  Educator  [facility]  Instruction Review Code  1- Verbalizes Understanding      Know Your Numbers and Risk Factors: -Group verbal and written instruction about important numbers in your health.  Discussion of what are risk factors and how they play a role in the disease process.  Review of Cholesterol, Blood Pressure, Diabetes, and BMI and the role they play in your overall health.   Sleep Hygiene: -Provides group verbal and written instruction about how sleep can affect your health.  Define sleep hygiene, discuss sleep cycles and impact of sleep habits. Review good sleep hygiene tips.    Other: -Provides group and verbal instruction on various topics (see comments)    Knowledge Questionnaire Score: Knowledge Questionnaire Score - 04/23/17 1427      Knowledge Questionnaire Score   Pre Score  14/18 reviewed with patient        Core Components/Risk Factors/Patient Goals at Admission: Personal Goals and Risk Factors at Admission - 04/23/17 1443  Core Components/Risk Factors/Patient Goals on Admission    Weight Management  Yes    Intervention  Weight Management: Develop a combined nutrition and exercise program designed to reach desired caloric intake, while maintaining appropriate intake of nutrient and fiber, sodium and fats, and appropriate energy expenditure required for the weight goal.;Weight Management/Obesity: Establish reasonable short term and long term weight goals.;Weight Management: Provide education and appropriate resources to  help participant work on and attain dietary goals.    Admit Weight  105 lb (47.6 kg)    Goal Weight: Short Term  105 lb (47.6 kg)    Goal Weight: Long Term  105 lb (47.6 kg)    Expected Outcomes  Short Term: Continue to assess and modify interventions until short term weight is achieved;Long Term: Adherence to nutrition and physical activity/exercise program aimed toward attainment of established weight goal;Weight Maintenance: Understanding of the daily nutrition guidelines, which includes 25-35% calories from fat, 7% or less cal from saturated fats, less than 237m cholesterol, less than 1.5gm of sodium, & 5 or more servings of fruits and vegetables daily    Improve shortness of breath with ADL's  Yes    Intervention  Provide education, individualized exercise plan and daily activity instruction to help decrease symptoms of SOB with activities of daily living.    Expected Outcomes  Short Term: Improve cardiorespiratory fitness to achieve a reduction of symptoms when performing ADLs       Core Components/Risk Factors/Patient Goals Review:  Goals and Risk Factor Review    Row Name 05/16/17 1026             Core Components/Risk Factors/Patient Goals Review   Personal Goals Review  Weight Management/Obesity;Improve shortness of breath with ADL's       Review  SMyleshas been doing well in [facility] and has been improving with her shortness of breath. Her blood pressure has been within normal limits. Her weight has been steady. She weighs 102 pounds. She is not trying to lose weight and watchers her weight closely.  Reaching and straining are some things that give her trouble breathing. Most of her household activities do not give her shortness of breath.       Expected Outcomes  Short: watch weight closely.  Long: Maintain current weight           Core Components/Risk Factors/Patient Goals at Discharge (Final Review):  Goals and Risk Factor Review - 05/16/17 1026      Core Components/Risk  Factors/Patient Goals Review   Personal Goals Review  Weight Management/Obesity;Improve shortness of breath with ADL's    Review  SAlawnahas been doing well in [facility] and has been improving with her shortness of breath. Her blood pressure has been within normal limits. Her weight has been steady. She weighs 102 pounds. She is not trying to lose weight and watchers her weight closely.  Reaching and straining are some things that give her trouble breathing. Most of her household activities do not give her shortness of breath.    Expected Outcomes  Short: watch weight closely.  Long: Maintain current weight        ITP Comments: ITP Comments    Row Name 04/23/17 1403 04/23/17 1445 05/07/17 0916 06/04/17 0825     ITP Comments  Medical Evaluation completed. Chart sent for review and changes to Dr. MEmily FilbertDirector of [place] Diagnosis can be found in [facility] encounter 04/23/17  patient is on Plavix to prevent stroke due to a hospital scare.  She had slurred speech at one point but none of her test came back positive for a stroke.  30 day review completed. ITP sent to Dr. Emily Filbert Director of [place]. Continue with ITP unless changes are made by physician.  30 day review completed. ITP sent to Dr. Emily Filbert Director of [place]. Continue with ITP unless changes are made by physician.       Comments: 30 day review

## 2017-06-04 NOTE — Telephone Encounter (Signed)
   Called patient to make aware Dr. Mortimer Fries will send 10mg  of Prednisone for 5 days. Patient wants to know if an abx  Azithromycin could be added as well. She started [facility] and is so congested.      Patient came by heading to cardio/pulm rehab Is having congestion and drainage that is getting into chest Sinuses making it hard to breathe, sneezing constantly and having headaches Patient would like to know if there is something that can be prescribed to help Please call to discuss

## 2017-06-04 NOTE — Progress Notes (Signed)
Daily Session Note  Patient Details  Name: Anne [name] MRN: [id number] Date of Birth: 05/16/1950 Referring Provider:     Pulmonary Rehab from 04/23/2017 in [facility] Cardiac and Pulmonary Rehab  Referring Provider  Merton Border MD      Encounter Date: 06/04/2017  Check In: Session Check In - 06/04/17 1008      Check-In   Location  [facility]-Cardiac & Pulmonary Rehab    Staff Present  Nada Maclachlan, BA, ACSM CEP, Exercise Physiologist;Kelly Amedeo Plenty, BS, ACSM CEP, Exercise Physiologist;Corry Ihnen Flavia Shipper    Supervising physician immediately available to respond to emergencies  [facility] immediately available ER MD    Physician(s)  Dr. Corky Downs and Jimmye Norman    Medication changes reported      No    Fall or balance concerns reported     No    Tobacco Cessation  No Change    Warm-up and Cool-down  Performed as group-led instruction    Resistance Training Performed  Yes    VAD Patient?  No      Pain Assessment   Currently in Pain?  No/denies          Social History   Tobacco Use  Smoking Status Never Smoker  Smokeless Tobacco Never Used    Goals Met:  Independence with exercise equipment Exercise tolerated well No report of cardiac concerns or symptoms Strength training completed today  Goals Unmet:  Not Applicable  Comments: Pt able to follow exercise prescription today without complaint.  Will continue to monitor for progression.   Dr. Emily Filbert is Medical Director for [place] and [facility].

## 2017-06-04 NOTE — Telephone Encounter (Signed)
Prednisone 10 mg daily for 5 days

## 2017-06-05 MED ORDER — AZITHROMYCIN 250 MG PO TABS: ORAL_TABLET | ORAL | 0 refills | Status: AC

## 2017-06-05 NOTE — Telephone Encounter (Signed)
Patient aware zpak being sent to pharmacy. Nothing further needed.

## 2017-06-05 NOTE — Telephone Encounter (Signed)
Yes. Z  pak please

## 2017-06-06 ENCOUNTER — Encounter: Payer: Medicare Other | Admitting: *Deleted

## 2017-06-06 DIAGNOSIS — J8410 Pulmonary fibrosis, unspecified: Secondary | ICD-10-CM

## 2017-06-06 NOTE — Progress Notes (Signed)
Daily Session Note  Patient Details  Name: Anne [name] MRN: [id number] Date of Birth: [birth year] Referring Provider:     Pulmonary Rehab from 04/23/2017 in [facility]  Referring Provider  Merton Border MD      Encounter Date: 06/06/2017  Check In: Session Check In - 06/06/17 1000      Check-In   Location  [facility] Pul[facility] Re[facility]    Staff Present  Alberteen Sam, MA, RCEP, CCRP, Exercise Physiologist;Amanda Oletta Darter, BA, ACSM CEP, Exercise Physiologist;Joseph Flavia Shipper    Supervising physician immediately available to respond to emergencies  [facility] immediately available ER MD    Physician(s)  Drs. Lord and [employer]    Medication changes reported      No    Fall or balance concerns reported     No    Warm-up and Cool-down  Performed as group-led Higher education careers adviser Performed  Yes    VAD Patient?  No      Pain Assessment   Currently in Pain?  No/denies          Social History   Tobacco Use  Smoking Status Never Smoker  Smokeless Tobacco Never Used    Goals Met:  Proper associated with RPD/PD & O2 Sat Independence with exercise equipment Using PLB without cueing & demonstrates good technique Exercise tolerated well No report of cardiac concerns or symptoms Strength training completed today  Goals Unmet:  Not Applicable  Comments:Pt able to follow exercise prescription today without complaint.  Will continue to monitor for progression.    Dr. Emily Filbert is Medical Director for [facility] and [facility].

## 2017-06-08 DIAGNOSIS — J8410 Pulmonary fibrosis, unspecified: Secondary | ICD-10-CM

## 2017-06-08 NOTE — Progress Notes (Signed)
Daily Session Note  Patient Details  Name: Anne [name] MRN: [id number] Date of Birth: 08-20-1950 Referring Provider:     Pulmonary Rehab from 04/23/2017 in [facility] and Pul[facility] Rehab  Referring Provider  Merton Border MD      Encounter Date: 06/08/2017  Check In: Session Check In - 06/08/17 1009      Check-In   Location  [facility]    Staff Present  Renita Papa, RN BSN;Mandi [place], BS, PEC;Joseph Myrtle    Supervising physician immediately available to respond to emergencies  [facility] immediately available ER MD    Physician(s)  Dr. Burlene Arnt and Corky Downs    Medication changes reported      No    Fall or balance concerns reported     No    Tobacco Cessation  No Change    Warm-up and Cool-down  Performed as group-led instruction    Resistance Training Performed  Yes    VAD Patient?  No      Pain Assessment   Currently in Pain?  No/denies          Social History   Tobacco Use  Smoking Status Never Smoker  Smokeless Tobacco Never Used    Goals Met:  Independence with exercise equipment Exercise tolerated well No report of cardiac concerns or symptoms Strength training completed today  Goals Unmet:  Not Applicable  Comments: Pt able to follow exercise prescription today without complaint.  Will continue to monitor for progression.   Dr. Emily Filbert is Medical Director for [place] and [facility].

## 2017-06-11 DIAGNOSIS — J8410 Pulmonary fibrosis, unspecified: Secondary | ICD-10-CM | POA: Diagnosis not present

## 2017-06-11 NOTE — Progress Notes (Signed)
Daily Session Note  Patient Details  Name: Anne [name] MRN: [id number] Date of Birth: 09-02-1950 Referring Provider:     Pulmonary Rehab from 04/23/2017 in [facility] and Pulmonary Rehab  Referring Provider  Merton Border MD      Encounter Date: 06/11/2017  Check In: Session Check In - 06/11/17 0956      Check-In   Location  [facility]    Staff Present  Earlean Shawl, BS, ACSM CEP, Exercise Physiologist;Amanda Oletta Darter, BA, ACSM CEP, Exercise Physiologist;Consuello Lassalle Flavia Shipper    Supervising physician immediately available to respond to emergencies  [facility] immediately available ER MD    Physician(s)  Dr. Burlene Arnt and Corky Downs    Medication changes reported      No    Fall or balance concerns reported     No    Tobacco Cessation  No Change    Warm-up and Cool-down  Performed as group-led instruction    Resistance Training Performed  Yes    VAD Patient?  No      Pain Assessment   Currently in Pain?  No/denies          Social History   Tobacco Use  Smoking Status Never Smoker  Smokeless Tobacco Never Used    Goals Met:  Independence with exercise equipment Exercise tolerated well No report of cardiac concerns or symptoms Strength training completed today  Goals Unmet:  Not Applicable  Comments: Pt able to follow exercise prescription today without complaint.  Will continue to monitor for progression.   Dr. Emily Filbert is Medical Director for [place] and [facility].

## 2017-06-18 ENCOUNTER — Telehealth: Payer: Self-pay

## 2017-06-18 DIAGNOSIS — J8410 Pulmonary fibrosis, unspecified: Secondary | ICD-10-CM

## 2017-06-18 NOTE — Telephone Encounter (Signed)
Anne [name] called to say she will not be in [place] today due to a death in the family.

## 2017-06-21 ENCOUNTER — Ambulatory Visit: Payer: Self-pay

## 2017-06-21 ENCOUNTER — Telehealth (INDEPENDENT_AMBULATORY_CARE_PROVIDER_SITE_OTHER): Payer: Self-pay | Admitting: Orthopedic Surgery

## 2017-06-21 NOTE — Telephone Encounter (Signed)
Patient called saying that she recently had a dentist appointment and was given ibuprofen and hydrocodone, she states she's having pretty severe swelling in both ankles and wanted to know if she should be seen. She thinks it may be from the medication. CB # [phone]

## 2017-06-21 NOTE — Telephone Encounter (Signed)
I called pt and she states that she has taken both of these medications before without incident. She states that she has spoken to her dentist and he advised that he could not see where there was a correlation between the medication and the swelling per pt report. Advised that she is wearing compression socks at night and that she had stopped her pulmonary rehab. Advised that for the best results she soul wear the socks during the day the compression would better serve her while she is up during the day and to call her PCP. That the bilateral leg swelling should be addressed and that they would be able to better direct questions about rehab etc. I advised that  She should call them and discuss and see if she can make an appt for evaluation. To call with any questions and to keep updated.

## 2017-06-21 NOTE — Telephone Encounter (Signed)
Patient called with c/o "ankle edema." She says "both my ankles started swelling last week. I can't wear the shoes that I have to put my whole foot in. I don't have pain, redness. I have noticed that my calves feel tight at night and I noticed my fingers were swollen. I don't have SOB and I believe I've gained about 2 lbs." According to protocol, see PCP within 24 hours, no available appointment with provider or practice, offered to go to another practice and she agreed. Appointment made for tomorrow at 1400 with Clarene Reamer, NP, care advice given, patient verbalized understanding.   Reason for Disposition . [1] Very swollen joint AND [2] no fever  Answer Assessment - Initial Assessment Questions 1. LOCATION: "Which joint is swollen?"     Both ankles 2. ONSET: "When did the swelling start?"     Last week 3. SIZE: "How large is the swelling?"     Can't get my shoes on that I normally wear 4. PAIN: "Is there any pain?" If so, ask: "How bad is it?" (Scale 1-10; or mild, moderate, severe)     No 5. CAUSE: "What do you think caused the swollen joint?"     I don't know 6. OTHER SYMPTOMS: "Do you have any other symptoms?" (e.g., fever, chest pain, difficulty breathing, calf pain)     Tightness to calves, some swelling to fingers, 2 lb weight gain 7. PREGNANCY: "Is there any chance you are pregnant?" "When was your last menstrual period?"     No  Protocols used: ANKLE SWELLING-A-AH

## 2017-06-22 ENCOUNTER — Encounter: Payer: Self-pay | Admitting: Family Medicine

## 2017-06-22 ENCOUNTER — Ambulatory Visit (INDEPENDENT_AMBULATORY_CARE_PROVIDER_SITE_OTHER): Payer: Medicare Other | Admitting: Family Medicine

## 2017-06-22 ENCOUNTER — Other Ambulatory Visit: Payer: Self-pay

## 2017-06-22 VITALS — BP 118/66 | HR 71 | Temp 97.9°F | Wt 105.5 lb

## 2017-06-22 DIAGNOSIS — R600 Localized edema: Secondary | ICD-10-CM | POA: Diagnosis not present

## 2017-06-22 LAB — COMPREHENSIVE METABOLIC PANEL
ALT: 14 U/L (ref 0–35)
AST: 20 U/L (ref 0–37)
Albumin: 3.5 g/dL (ref 3.5–5.2)
Alkaline Phosphatase: 110 U/L (ref 39–117)
BUN: 15 mg/dL (ref 6–23)
CO2: 29 mEq/L (ref 19–32)
Calcium: 8.8 mg/dL (ref 8.4–10.5)
Chloride: 106 mEq/L (ref 96–112)
Creatinine, Ser: 0.93 mg/dL (ref 0.40–1.20)
GFR: 63.76 mL/min (ref >60.00)
Glucose, Bld: 106 mg/dL — ABNORMAL HIGH (ref 70–99)
Potassium: 3.7 mEq/L (ref 3.5–5.1)
Sodium: 140 mEq/L (ref 135–145)
Total Bilirubin: 0.4 mg/dL (ref 0.2–1.2)
Total Protein: 6.3 g/dL (ref 6.0–8.3)

## 2017-06-22 LAB — CBC WITH DIFFERENTIAL/PLATELET
Basophils Absolute: 0 10*3/uL (ref 0.0–0.1)
Basophils Relative: 1 % (ref 0.0–3.0)
Eosinophils Absolute: 0.2 10*3/uL (ref 0.0–0.7)
Eosinophils Relative: 5.6 % — ABNORMAL HIGH (ref 0.0–5.0)
HCT: 30.7 % — ABNORMAL LOW (ref 36.0–46.0)
Hemoglobin: 10.2 g/dL — ABNORMAL LOW (ref 12.0–15.0)
Lymphocytes Relative: 23 % (ref 12.0–46.0)
Lymphs Abs: 0.9 10*3/uL (ref 0.7–4.0)
MCHC: 33.3 g/dL (ref 30.0–36.0)
MCV: 87.9 fl (ref 78.0–100.0)
Monocytes Absolute: 0.3 10*3/uL (ref 0.1–1.0)
Monocytes Relative: 8 % (ref 3.0–12.0)
Neutro Abs: 2.5 10*3/uL (ref 1.4–7.7)
Neutrophils Relative %: 62.4 % (ref 43.0–77.0)
Platelets: 214 10*3/uL (ref 150.0–400.0)
RBC: 3.49 Mil/uL — ABNORMAL LOW (ref 3.87–5.11)
RDW: 15.4 % (ref 11.5–15.5)
WBC: 4 10*3/uL (ref 4.0–10.5)

## 2017-06-22 LAB — BRAIN NATRIURETIC PEPTIDE: Pro B Natriuretic peptide (BNP): 76 pg/mL (ref 0.0–100.0)

## 2017-06-22 NOTE — Progress Notes (Signed)
Subjective:    Patient ID: Anne [name], female    DOB: 01-13-1951, 67 y.o.   MRN: [id number]  HPI This is a 67 yo female who presents today with bilateral ankle/ lower leg swelling x 1 week. Pain and tightness into calves. No fever/chills, no chest pain, no palpitations, no cough, no SOB/DOE. She has been wearing compression socks at night with some relief. Today she reports that swelling is significantly improved but she has pictures which show bilateral feet and ankles as very swollen and red.  Has had similar episode in past following oral quinolone antibiotics.  Has been on 3 rounds of antibiotics for dental infection following extractions for preparation for implants. She is currently undergoing pulmonary rehab for recent deconditioning and COPD. She is on clopidogrel following CVA/TIA.    Past Medical History:  Diagnosis Date  . Allergic rhinitis   . Anemia   . Asthma   . Complication of anesthesia    Breathing problems upon waking up. 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  . Compressed cervical disc   . COPD (chronic obstructive pulmonary disease) ([place])   . CVA (cerebral infarction)   . Dyspnea   . Esophageal dysmotility   . Heart murmur    as child  . History of hiatal hernia   . IBS (irritable bowel syndrome)   . Migraine   . PICC (peripherally inserted central catheter) removal 02/20/2017  . PONV (postoperative nausea and vomiting)   . Problems with swallowing    intermittently  . Pulmonary fibrosis ([place])   . Shingles   . Stroke [place] [place])    slurred speech, drawn face, imaging normal, occurred twice, [facility]-"TIA" if antything" 02/20/17-no residual effects  . Tracheostomy in place [facility])   . Vocal cord paresis    Past Surgical History:  Procedure Laterality Date  . ABDOMINAL HYSTERECTOMY    . APPLICATION OF A-CELL OF HEAD/NECK N/A 02/21/2017   Procedure: APPLICATION OF A-CELL OF HEAD/NECK;  Surgeon: Wallace Going, DO;  Location: [place];  Service: Plastics;  Laterality: N/A;  . BOTOX INJECTION N/A 07/29/2013   Procedure: BOTOX INJECTION;  Surgeon: Jerene Bears, MD;  Location: [facility];  Service: Gastroenterology;  Laterality: N/A;  . BOTOX INJECTION N/A 05/04/2015   Procedure: BOTOX INJECTION;  Surgeon: Jerene Bears, MD;  Location: [facility];  Service: Gastroenterology;  Laterality: N/A;  . BREAST BIOPSY Left    neg  . BREAST SURGERY Left 2002   bx of skin  . CHOLECYSTECTOMY    . COLONOSCOPY    . ESOPHAGEAL MANOMETRY N/A 12/16/2012   Procedure: ESOPHAGEAL MANOMETRY (EM);  Surgeon: Jerene Bears, MD;  Location: [facility];  Service: Gastroenterology;  Laterality: N/A;  . ESOPHAGOGASTRODUODENOSCOPY (EGD) WITH PROPOFOL N/A 07/29/2013   Procedure: ESOPHAGOGASTRODUODENOSCOPY (EGD) WITH PROPOFOL;  Surgeon: Jerene Bears, MD;  Location: [facility];  Service: Gastroenterology;  Laterality: N/A;  with botox injection  . ESOPHAGOGASTRODUODENOSCOPY (EGD) WITH PROPOFOL N/A 05/04/2015   Procedure: ESOPHAGOGASTRODUODENOSCOPY (EGD) WITH PROPOFOL;  Surgeon: Jerene Bears, MD;  Location: [facility];  Service: Gastroenterology;  Laterality: N/A;  . EYE SURGERY     Catarct surgery 2014  . Eye Surgery AS Child Left   . INCISION AND DRAINAGE OF WOUND N/A 02/21/2017   Procedure: IRRIGATION AND DEBRIDEMENT WOUND NECK;  Surgeon: Wallace Going, DO;  Location: [place];  Service: Plastics;  Laterality: N/A;  . JEJUNOSTOMY FEEDING TUBE     x2 both failed. no  longer has  . MULTIPLE TOOTH EXTRACTIONS     2 teeth removed  . POSTERIOR CERVICAL FUSION/FORAMINOTOMY N/A 01/10/2017   Procedure: LAMINECTOMY AND FORAMINOTOMY CERVICAL FOUR-CERVICAL FIVE, CERVICAL FIVE-SIX POSTERIOR CERVICAL INSTRUMENT FUSION CERVICAL THREE-CERVICAL SEVEN,CERVICAL LAMINECTOMY CERVICAL THREE-CERVICAL SEVEN.;  Surgeon: Eustace Moore, MD;  Location: [place];  Service: Neurosurgery;  Laterality: N/A;  posterior  . TRACHEOSTOMY  1996   [facility] at [facility], Dr. Kathyrn Sheriff  . TUBAL  LIGATION    . VIDEO BRONCHOSCOPY Bilateral 11/20/2012   Procedure: VIDEO BRONCHOSCOPY WITH FLUORO;  Surgeon: Juanito Doom, MD;  Location: [facility];  Service: Cardiopulmonary;  Laterality: Bilateral;   Family History  Problem Relation Age of Onset  . Asthma Cousin   . COPD Cousin   . Breast cancer Maternal Grandmother 60  . Cancer Maternal Grandmother        breast  . Asthma Father   . Cancer Father        lung  . Kidney cancer Father   . Cancer Paternal Uncle        lung  . COPD Paternal Grandfather   . Breast cancer Maternal Aunt 56  . Cancer Maternal Aunt        breast  . Bladder Cancer Neg Hx    Social History   Tobacco Use  . Smoking status: Never Smoker  . Smokeless tobacco: Never Used  Substance Use Topics  . Alcohol use: No    Alcohol/week: 0.0 oz  . Drug use: No      Review of Systems Per HPI    Objective:   Physical Exam  Constitutional: She is oriented to person, place, and time. No distress.  thin  HENT:  Head: Normocephalic and atraumatic.  Eyes: Conjunctivae are normal.  Cardiovascular: Normal rate and regular rhythm.  Murmur heard. Pulmonary/Chest: Effort normal and breath sounds normal. No respiratory distress. She has no wheezes. She has no rales.  Musculoskeletal: She exhibits edema (trace ankle edema).  Neurological: She is alert and oriented to person, place, and time.  Skin: Skin is warm and dry. She is not diaphoretic.  Vitals reviewed.     BP 118/66   Pulse 71   Temp 97.9 F (36.6 C) (Oral)   Wt 105 lb 8 oz (47.9 kg)   SpO2 99%   BMI 18.57 kg/m  Wt Readings from Last 3 Encounters:  06/22/17 105 lb 8 oz (47.9 kg)  05/18/17 102 lb 12 oz (46.6 kg)  04/23/17 105 lb 1.6 oz (47.7 kg)       Assessment & Plan:  1. Pedal edema - unclear etiology, has had similar episode with antibiotics in past, does not appear to be fluid overloaded, but will check BNP - continue to elevate feet when sitting, wear compression socks  (suggested she wear during the day instead of at night) - follow up if reoccurs  - Comprehensive metabolic panel - Brain natriuretic peptide - CBC with Differential   Clarene Reamer, FNP-BC  [facility], [place]  06/24/2017 4:02 PM

## 2017-06-22 NOTE — Progress Notes (Signed)
Subjective:    Patient ID: Anne [name], female    DOB: Aug 18, 1950, 67 y.o.   MRN: [id number]  HPI Anne [name] is a 67 y.o. female who presents today with bilateral ankle swelling. Noticed it started a week ago after getting 3 teeth pulled in prep for teeth implants. Has tried compression socks and legs raised with success but swelling returned. Worse at night and causes pain in calf. Currently taking plavix for CVA prophylaxis and lasix for ankle swelling from when she was on a Flouroquinolone.   Review of Systems  Constitutional: Negative for fatigue and fever.  Respiratory: Negative for cough, chest tightness and shortness of breath.   Cardiovascular: Negative for chest pain and palpitations.  Endocrine: Negative for polyuria.  Genitourinary: Negative for difficulty urinating and dysuria.      Past Medical History:  Diagnosis Date  . Allergic rhinitis   . Anemia   . Asthma   . Complication of anesthesia    Breathing problems upon waking up. 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  . Compressed cervical disc   . COPD (chronic obstructive pulmonary disease) ([place])   . CVA (cerebral infarction)   . Dyspnea   . Esophageal dysmotility   . Heart murmur    as child  . History of hiatal hernia   . IBS (irritable bowel syndrome)   . Migraine   . PICC (peripherally inserted central catheter) removal 02/20/2017  . PONV (postoperative nausea and vomiting)   . Problems with swallowing    intermittently  . Pulmonary fibrosis ([place])   . Shingles   . Stroke [facility])    slurred speech, drawn face, imaging normal, occurred twice, [facility]-"TIA" if antything" 02/20/17-no residual effects  . Tracheostomy in place [facility])   . Vocal cord paresis    Past Surgical History:  Procedure Laterality Date  . ABDOMINAL HYSTERECTOMY    . APPLICATION OF A-CELL OF HEAD/NECK N/A 02/21/2017   Procedure: APPLICATION OF A-CELL OF HEAD/NECK;  Surgeon: Wallace Going, DO;   Location: [place];  Service: Plastics;  Laterality: N/A;  . BOTOX INJECTION N/A 07/29/2013   Procedure: BOTOX INJECTION;  Surgeon: Jerene Bears, MD;  [facility];  Service: Gastroenterology;  Laterality: N/A;  . BOTOX INJECTION N/A 05/04/2015   Procedure: BOTOX INJECTION;  Surgeon: Jerene Bears, MD;  [facility];  Service: Gastroenterology;  Laterality: N/A;  . BREAST BIOPSY Left    neg  . BREAST SURGERY Left 2002   bx of skin  . CHOLECYSTECTOMY    . COLONOSCOPY    . ESOPHAGEAL MANOMETRY N/A 12/16/2012   Procedure: ESOPHAGEAL MANOMETRY (EM);  Surgeon: Jerene Bears, MD;  [facility];  Service: Gastroenterology;  Laterality: N/A;  . ESOPHAGOGASTRODUODENOSCOPY (EGD) WITH PROPOFOL N/A 07/29/2013   Procedure: ESOPHAGOGASTRODUODENOSCOPY (EGD) WITH PROPOFOL;  Surgeon: Jerene Bears, MD;  [facility];  Service: Gastroenterology;  Laterality: N/A;  with botox injection  . ESOPHAGOGASTRODUODENOSCOPY (EGD) WITH PROPOFOL N/A 05/04/2015   Procedure: ESOPHAGOGASTRODUODENOSCOPY (EGD) WITH PROPOFOL;  Surgeon: Jerene Bears, MD;  [facility];  Service: Gastroenterology;  Laterality: N/A;  . EYE SURGERY     Catarct surgery 2014  . Eye Surgery AS Child Left   . INCISION AND DRAINAGE OF WOUND N/A 02/21/2017   Procedure: IRRIGATION AND DEBRIDEMENT WOUND NECK;  Surgeon: Wallace Going, DO;  Location: [place];  Service: Plastics;  Laterality: N/A;  . JEJUNOSTOMY FEEDING TUBE     x2  both failed. no longer has  . MULTIPLE TOOTH EXTRACTIONS     2 teeth removed  . POSTERIOR CERVICAL FUSION/FORAMINOTOMY N/A 01/10/2017   Procedure: LAMINECTOMY AND FORAMINOTOMY CERVICAL FOUR-CERVICAL FIVE, CERVICAL FIVE-SIX POSTERIOR CERVICAL INSTRUMENT FUSION CERVICAL THREE-CERVICAL SEVEN,CERVICAL LAMINECTOMY CERVICAL THREE-CERVICAL SEVEN.;  Surgeon: Eustace Moore, MD;  Location: [place];  Service: Neurosurgery;  Laterality: N/A;  posterior  . TRACHEOSTOMY  1996   done at [facility], Dr.  Kathyrn Sheriff  . TUBAL LIGATION    . VIDEO BRONCHOSCOPY Bilateral 11/20/2012   Procedure: VIDEO BRONCHOSCOPY WITH FLUORO;  Surgeon: Juanito Doom, MD;  Location[facility];  Service: Cardiopulmonary;  Laterality: Bilateral;   Family History  Problem Relation Age of Onset  . Asthma Cousin   . COPD Cousin   . Breast cancer Maternal Grandmother 60  . Cancer Maternal Grandmother        breast  . Asthma Father   . Cancer Father        lung  . Kidney cancer Father   . Cancer Paternal Uncle        lung  . COPD Paternal Grandfather   . Breast cancer Maternal Aunt 26  . Cancer Maternal Aunt        breast  . Bladder Cancer Neg Hx    Social History   Socioeconomic History  . Marital status: Married    Spouse name: Not on file  . Number of children: 4  . Years of education: Not on file  . Highest education level: Not on file  Occupational History  . Not on file  Social Needs  . Financial resource strain: Not on file  . Food insecurity:    Worry: Not on file    Inability: Not on file  . Transportation needs:    Medical: Not on file    Non-medical: Not on file  Tobacco Use  . Smoking status: Never Smoker  . Smokeless tobacco: Never Used  Substance and Sexual Activity  . Alcohol use: No    Alcohol/week: 0.0 oz  . Drug use: No  . Sexual activity: Not on file  Lifestyle  . Physical activity:    Days per week: Not on file    Minutes per session: Not on file  . Stress: Not on file  Relationships  . Social connections:    Talks on phone: Not on file    Gets together: Not on file    Attends religious service: Not on file    Active member of club or organization: Not on file    Attends meetings of clubs or organizations: Not on file    Relationship status: Not on file  . Intimate partner violence:    Fear of current or ex partner: Not on file    Emotionally abused: Not on file    Physically abused: Not on file    Forced sexual activity: Not on file  Other Topics Concern    . Not on file  Social History Narrative   Lives in [place] with husband.  She has four children.   Retired from [employer]            Current Outpatient Medications on File Prior to Visit  Medication Sig Dispense Refill  . acetaminophen (TYLENOL) 500 MG tablet Take 500 mg by mouth daily as needed for moderate pain or headache.    . albuterol (PROVENTIL HFA;VENTOLIN HFA) 108 (90 Base) MCG/ACT inhaler Inhale 2 puffs into the lungs every 6 (six) hours as needed for  wheezing or shortness of breath. 1 Inhaler 5  . ALPRAZolam (XANAX) 0.25 MG tablet TAKE ONE-HALF TABLET BY MOUTH TWICE DAILY AS NEEDED 30 tablet 1  . amoxicillin (AMOXIL) 500 MG capsule Take 1 capsule (500 mg total) by mouth 2 (two) times daily. 14 capsule 0  . cetirizine (ZYRTEC) 10 MG tablet TAKE ONE TABLET EVERY DAY 30 tablet 5  . clopidogrel (PLAVIX) 75 MG tablet TAKE ONE TABLET BY MOUTH EVERY MORNING 90 tablet 1  . EPINEPHrine (EPIPEN 2-PAK) 0.3 mg/0.3 mL IJ SOAJ injection Inject 0.3 mLs (0.3 mg total) into the muscle once. (Patient taking differently: Inject 0.3 mg once as needed into the muscle (FOR AN ALLERGIC REACTION). ) 1 Device 0  . estradiol (ESTRACE) 1 MG tablet TAKE ONE TABLET BY MOUTH EVERY DAY 90 tablet 1  . fluticasone (FLONASE) 50 MCG/ACT nasal spray PLACE 2 SPRAYS INTO BOTH NOSTRILS DAILY. (Patient taking differently: Place 2 sprays into both nostrils daily as needed for allergies. ) 16 g 2  . furosemide (LASIX) 20 MG tablet TAKE ONE TABLET EVERY DAY AS NEEDED 90 tablet 1  . hydroxypropyl methylcellulose (ISOPTO TEARS) 2.5 % ophthalmic solution Place 1 drop into both eyes 3 (three) times daily as needed for dry eyes.    [name] ipratropium-albuterol (DUONEB) 0.5-2.5 (3) MG/3ML SOLN Take 3 mLs by nebulization every 4 (four) hours as needed. Dx 496 120 mL 2  . linaclotide (LINZESS) 72 MCG capsule Take 1 capsule (72 mcg total) by mouth daily before breakfast. 30 capsule 3  . Liniments (SALONPAS PAIN RELIEF PATCH EX) Apply  1-2 patches daily as needed topically (FOR SHOULDER PAIN).     [name] methocarbamol (ROBAXIN) 500 MG tablet Take 1 tablet (500 mg total) by mouth every 6 (six) hours as needed for muscle spasms. 60 tablet 0  . montelukast (SINGULAIR) 10 MG tablet Take 10 mg by mouth at bedtime.     . mupirocin ointment (BACTROBAN) 2 % Apply 1 application daily as needed topically (FOR [facility] SITE IRRITATION).     [name] oxyCODONE (OXY IR/ROXICODONE) 5 MG immediate release tablet Take 1 tablet (5 mg total) by mouth every 6 (six) hours as needed for breakthrough pain. 30 tablet 0  . pantoprazole (PROTONIX) 40 MG tablet Take 1 tablet (40 mg total) by mouth daily. 90 tablet 0  . pantoprazole (PROTONIX) 40 MG tablet TAKE ONE TABLET EVERY DAY 90 tablet 1  . potassium chloride 20 MEQ/15ML (10%) SOLN Take 15 mLs (20 mEq total) by mouth daily as needed (cramps). (Patient taking differently: Take 20 mEq daily as needed by mouth (for cramping). ) 240 mL 0  . predniSONE (DELTASONE) 10 MG tablet Take 1 tablet (10 mg total) by mouth daily with breakfast. 5 tablet 0  . Probiotic Product (PROBIOTIC PO) Take 1 capsule daily as needed by mouth (FOR G.I. DISTRESS).     . sodium chloride (OCEAN) 0.65 % SOLN nasal spray Place 1 spray into both nostrils every 4 (four) hours as needed for congestion.     . traMADol (ULTRAM) 50 MG tablet 1/2-1 tid prn    . zolmitriptan (ZOMIG-ZMT) 5 MG disintegrating tablet Take 1 tablet (5 mg total) by mouth daily as needed for migraine. 10 tablet 0   Current Facility-Administered Medications on File Prior to Visit  Medication Dose Route Frequency Provider Last Rate Last Dose  . ipratropium-albuterol (DUONEB) 0.5-2.5 (3) MG/3ML nebulizer solution 3 mL  3 mL Nebulization Q6H Burnard Hawthorne, FNP   3 mL at 05/18/17 1437  Objective:   Physical Exam  Constitutional: No distress.  Cardiovascular: Normal rate, regular rhythm and intact distal pulses. Exam reveals no gallop and no friction rub.  Murmur  heard. Pulmonary/Chest: Effort normal. No accessory muscle usage. No respiratory distress. She has decreased breath sounds in the right lower field. She has wheezes in the right middle field, the left middle field and the left lower field. She exhibits no tenderness.  Musculoskeletal: Normal range of motion. She exhibits edema.       Right ankle: She exhibits swelling.       Left ankle: She exhibits swelling.  R and L Calf measurement were 32cms  Skin: Skin is dry.   BP 118/66   Pulse 71   Temp 97.9 F (36.6 C) (Oral)   Wt 105 lb 8 oz (47.9 kg)   SpO2 99%   BMI 18.57 kg/m    Wt Readings from Last 3 Encounters:  06/22/17 105 lb 8 oz (47.9 kg)  05/18/17 102 lb 12 oz (46.6 kg)  04/23/17 105 lb 1.6 oz (47.7 kg)      Assessment & Plan:   1. Edema of both ankles - Keep legs elevated as much as possible while sitting. Continue to go to pulmonary rehab - Comprehensive metabolic panel - Brain natriuretic peptide - CBC with Differential  Denita Lung, RN, Adult-Geriatric Nurse Practitioner Student

## 2017-06-22 NOTE — Patient Instructions (Addendum)
Keep legs elevated as much as possible while sitting. Continue to go to pulmonary rehab  Stop by Lab on your way out.   It has been a pleasure seeing you today. Denita Lung, RN, Adult-Geriatric Nurse Practitioner Student and Tor Netters, FNP   Edema Edema is when you have too much fluid in your body or under your skin. Edema may make your legs, feet, and ankles swell up. Swelling is also common in looser tissues, like around your eyes. This is a common condition. It gets more common as you get older. There are many possible causes of edema. Eating too much salt (sodium) and being on your feet or sitting for a long time can cause edema in your legs, feet, and ankles. Hot weather may make edema worse. Edema is usually painless. Your skin may look swollen or shiny. Follow these instructions at home:  Keep the swollen body part raised (elevated) above the level of your heart when you are sitting or lying down.  Do not sit still or stand for a long time.  Do not wear tight clothes. Do not wear garters on your upper legs.  Exercise your legs. This can help the swelling go down.  Wear elastic bandages or support stockings as told by your doctor.  Eat a low-salt (low-sodium) diet to reduce fluid as told by your doctor.  Depending on the cause of your swelling, you may need to limit how much fluid you drink (fluid restriction).  Take over-the-counter and prescription medicines only as told by your doctor. Contact a doctor if:  Treatment is not working.  You have heart, liver, or kidney disease and have symptoms of edema.  You have sudden and unexplained weight gain. Get help right away if:  You have shortness of breath or chest pain.  You cannot breathe when you lie down.  You have pain, redness, or warmth in the swollen areas.  You have heart, liver, or kidney disease and get edema all of a sudden.  You have a fever and your symptoms get worse all of a  sudden. Summary  Edema is when you have too much fluid in your body or under your skin.  Edema may make your legs, feet, and ankles swell up. Swelling is also common in looser tissues, like around your eyes.  Raise (elevate) the swollen body part above the level of your heart when you are sitting or lying down.  Follow your doctor's instructions about diet and how much fluid you can drink (fluid restriction). This information is not intended to replace advice given to you by your health care provider. Make sure you discuss any questions you have with your health care provider. Document Released: 08/30/2007 Document Revised: 03/31/2016 Document Reviewed: 03/31/2016 Elsevier Interactive Patient Education  2017 [employer].

## 2017-06-25 ENCOUNTER — Encounter: Payer: Medicare Other | Attending: Pulmonary Disease

## 2017-06-25 ENCOUNTER — Encounter: Payer: Self-pay | Admitting: Family Medicine

## 2017-06-25 DIAGNOSIS — J3800 Paralysis of vocal cords and larynx, unspecified: Secondary | ICD-10-CM | POA: Insufficient documentation

## 2017-06-25 DIAGNOSIS — Z8673 Personal history of transient ischemic attack (TIA), and cerebral infarction without residual deficits: Secondary | ICD-10-CM | POA: Insufficient documentation

## 2017-06-25 DIAGNOSIS — Z79899 Other long term (current) drug therapy: Secondary | ICD-10-CM | POA: Insufficient documentation

## 2017-06-25 DIAGNOSIS — J449 Chronic obstructive pulmonary disease, unspecified: Secondary | ICD-10-CM | POA: Insufficient documentation

## 2017-06-25 DIAGNOSIS — Z930 Tracheostomy status: Secondary | ICD-10-CM | POA: Insufficient documentation

## 2017-06-25 DIAGNOSIS — J8410 Pulmonary fibrosis, unspecified: Secondary | ICD-10-CM | POA: Insufficient documentation

## 2017-06-27 ENCOUNTER — Telehealth: Payer: Self-pay | Admitting: Family Medicine

## 2017-06-27 ENCOUNTER — Telehealth: Payer: Self-pay | Admitting: Urology

## 2017-06-27 DIAGNOSIS — J3800 Paralysis of vocal cords and larynx, unspecified: Secondary | ICD-10-CM | POA: Diagnosis not present

## 2017-06-27 DIAGNOSIS — J8410 Pulmonary fibrosis, unspecified: Secondary | ICD-10-CM | POA: Diagnosis not present

## 2017-06-27 DIAGNOSIS — J449 Chronic obstructive pulmonary disease, unspecified: Secondary | ICD-10-CM | POA: Diagnosis not present

## 2017-06-27 DIAGNOSIS — Z79899 Other long term (current) drug therapy: Secondary | ICD-10-CM | POA: Diagnosis not present

## 2017-06-27 DIAGNOSIS — Z930 Tracheostomy status: Secondary | ICD-10-CM | POA: Diagnosis not present

## 2017-06-27 DIAGNOSIS — Z8673 Personal history of transient ischemic attack (TIA), and cerebral infarction without residual deficits: Secondary | ICD-10-CM | POA: Diagnosis not present

## 2017-06-27 NOTE — Telephone Encounter (Signed)
Last b12 lab was in 2016 with normal values. Please advise

## 2017-06-27 NOTE — Telephone Encounter (Signed)
Patient called today and LM on our VM to call back. Did not say why she was calling, just asked for a return call. I called her back @ 1:40 and had to leave her a message on her VM to return the call.  Sharyn Lull

## 2017-06-27 NOTE — Progress Notes (Signed)
Daily Session Note  Patient Details  Name: Anne [name] MRN: [id number] Date of Birth: December 04, 1950 Referring Provider:     Pulmonary Rehab from 04/23/2017 in [facility]  Referring Provider  Merton Border MD      Encounter Date: 06/27/2017  Check In: Session Check In - 06/27/17 1019      Check-In   Location  [facility]    Staff Present  Nada Maclachlan, BA, ACSM CEP, Exercise Physiologist;Joven Mom Darrin Nipper, [place], RCEP, CCRP, Exercise Physiologist    Supervising physician immediately available to respond to emergencies  [facility] immediately available ER MD    Physician(s)  Dr. Mable Paris and Jimmye Norman    Medication changes reported      No    Fall or balance concerns reported     No    Tobacco Cessation  No Change    Warm-up and Cool-down  Performed as group-led instruction    Resistance Training Performed  Yes    VAD Patient?  No      Pain Assessment   Currently in Pain?  No/denies          Social History   Tobacco Use  Smoking Status Never Smoker  Smokeless Tobacco Never Used    Goals Met:  Independence with exercise equipment Exercise tolerated well No report of cardiac concerns or symptoms Strength training completed today  Goals Unmet:  Not Applicable  Comments: Pt able to follow exercise prescription today without complaint.  Will continue to monitor for progression.   Dr. Emily Filbert is Medical Director for [place] and [facility].

## 2017-06-27 NOTE — Telephone Encounter (Signed)
scheduled

## 2017-06-27 NOTE — Telephone Encounter (Signed)
It appears she has a history of B12 deficiency.  Given that we could set her up for injections 1000 mcg of vitamin B12 once monthly.  She will need to do this monthly to ensure that they stay in a good range.  Thanks.

## 2017-06-27 NOTE — Telephone Encounter (Signed)
Please advise 

## 2017-06-27 NOTE — Telephone Encounter (Signed)
Copied from [place]. Topic: General - Other >> Jun 27, 2017  9:44 AM Anne [name], NT wrote: Reason for [id number] would like a b12 injection please advise there is no order

## 2017-06-28 ENCOUNTER — Ambulatory Visit (INDEPENDENT_AMBULATORY_CARE_PROVIDER_SITE_OTHER): Payer: Medicare Other | Admitting: *Deleted

## 2017-06-28 DIAGNOSIS — E538 Deficiency of other specified B group vitamins: Secondary | ICD-10-CM

## 2017-06-28 MED ORDER — CYANOCOBALAMIN 1000 MCG/ML IJ SOLN
1000.0000 ug | Freq: Once | INTRAMUSCULAR | Status: AC
  Administered 2017-06-28: 1000 ug via INTRAMUSCULAR

## 2017-06-28 NOTE — Progress Notes (Signed)
Patient presented for B 12 injection to right deltoid, patient voiced no concerns nor showed any signs of distress during injection. 

## 2017-06-29 ENCOUNTER — Encounter: Payer: Medicare Other | Admitting: *Deleted

## 2017-06-29 DIAGNOSIS — J8410 Pulmonary fibrosis, unspecified: Secondary | ICD-10-CM

## 2017-06-29 NOTE — Progress Notes (Signed)
Daily Session Note  Patient Details  Name: Anne [name] MRN: [id number] Date of Birth: [birth year] Referring Provider:     Pulmonary Rehab from 04/23/2017 in [facility]  Referring Provider  Merton Border MD      Encounter Date: 06/29/2017  Check In: Session Check In - 06/29/17 1102      Check-In   Staff Present  Nyoka Cowden, RN, BSN, Glori Bickers, BS, Olga Coaster, RN BSN    Supervising physician immediately available to respond to emergencies  [facility] immediately available ER MD    Physician(s)  Drs. Mariea Clonts and Rifenbark    Medication changes reported      No    Fall or balance concerns reported     No    Tobacco Cessation  No Change    Warm-up and Cool-down  Performed as group-led instruction    Resistance Training Performed  Yes    VAD Patient?  No      Pain Assessment   Currently in Pain?  No/denies    Multiple Pain Sites  No          Social History   Tobacco Use  Smoking Status Never Smoker  Smokeless Tobacco Never Used    Goals Met:  Proper associated with RPD/PD & O2 Sat Independence with exercise equipment Improved SOB with ADL's Exercise tolerated well Strength training completed today  Goals Unmet:  Not Applicable  Comments: Pt able to follow exercise prescription today without complaint.  Will continue to monitor for progression.    Dr. Emily Filbert is Medical Director for [place] and [facility].

## 2017-07-02 DIAGNOSIS — J8410 Pulmonary fibrosis, unspecified: Secondary | ICD-10-CM

## 2017-07-02 NOTE — Progress Notes (Signed)
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  acetaminophen (TYLENOL) 500 MG tablet, Take 500 mg by mouth daily as needed for moderate pain or headache., Disp: , Rfl:  .  albuterol (PROVENTIL HFA;VENTOLIN HFA) 108 (90 Base) MCG/ACT [name], Inhale 2 puffs into the lungs every 6 (six) hours as needed for wheezing or shortness of breath., Disp: 1 [name], Rfl: 5 .  ALPRAZolam (XANAX) 0.25 MG tablet, TAKE ONE-HALF TABLET BY MOUTH TWICE DAILY AS NEEDED, Disp: 30 tablet, Rfl: 1 .  amoxicillin (AMOXIL) 500 MG capsule, Take 1 capsule (500 mg total) by mouth 2 (two) times daily., Disp: 14 capsule, Rfl: 0 .  cetirizine (ZYRTEC) 10 MG tablet, TAKE ONE TABLET EVERY DAY, Disp: 30 tablet, Rfl: 5 .  clopidogrel (PLAVIX) 75 MG tablet, TAKE ONE TABLET BY MOUTH EVERY MORNING, Disp: 90 tablet, Rfl: 1 .  EPINEPHrine (EPIPEN 2-PAK) 0.3 mg/0.3 mL IJ SOAJ injection, Inject 0.3 mLs (0.3 mg total) into the muscle once. (Patient taking differently: Inject 0.3 mg once as needed into the muscle (FOR AN ALLERGIC REACTION). ), Disp: 1 Device, Rfl: 0 .  estradiol (ESTRACE) 1 MG tablet, TAKE ONE TABLET BY MOUTH EVERY DAY, Disp: 90 tablet, Rfl: 1 .  fluticasone (FLONASE) 50 MCG/ACT nasal spray, PLACE 2 SPRAYS INTO BOTH NOSTRILS DAILY. (Patient taking differently: Place 2 sprays into both nostrils daily as needed for allergies. ), Disp: 16 g, Rfl: 2 .  furosemide (LASIX) 20 MG tablet, TAKE ONE TABLET EVERY DAY AS NEEDED, Disp:  90 tablet, Rfl: 1 .  hydroxypropyl methylcellulose (ISOPTO TEARS) 2.5 % ophthalmic solution, Place 1 drop into both eyes 3 (three) times daily as needed for dry eyes., Disp: , Rfl:  .  ipratropium-albuterol (DUONEB) 0.5-2.5 (3) MG/3ML SOLN, Take 3 mLs by nebulization every 4 (four) hours as needed. Dx 496, Disp: 120 mL, Rfl: 2 .  linaclotide (LINZESS) 72 MCG capsule, Take 1 capsule (72 mcg total) by mouth daily before breakfast., Disp: 30 capsule, Rfl: 3 .  Liniments (SALONPAS PAIN RELIEF PATCH EX), Apply 1-2 patches daily as needed topically (FOR SHOULDER PAIN). , Disp: , Rfl:  .  methocarbamol (ROBAXIN) 500 MG tablet, Take 1 tablet (500 mg total) by mouth every 6 (six) hours as needed for muscle spasms., Disp: 60 tablet, Rfl: 0 .  montelukast (SINGULAIR) 10 MG tablet, Take 10 mg by mouth at bedtime. , Disp: , Rfl:  .  mupirocin ointment (BACTROBAN) 2 %, Apply 1 application daily as needed topically (FOR [facility] SITE IRRITATION). , Disp: , Rfl:  .  oxyCODONE (OXY IR/ROXICODONE) 5 MG immediate release tablet, Take 1 tablet (5 mg total) by mouth every 6 (six) hours as needed for breakthrough pain., Disp: 30 tablet, Rfl: 0 .  pantoprazole (PROTONIX) 40 MG tablet, Take 1 tablet (40 mg total) by mouth daily., Disp: 90 tablet, Rfl: 0 .  pantoprazole (PROTONIX) 40 MG tablet, TAKE ONE TABLET EVERY DAY, Disp: 90 tablet, Rfl: 1 .  potassium chloride 20 MEQ/15ML (10%)  SOLN, Take 15 mLs (20 mEq total) by mouth daily as needed (cramps). (Patient taking differently: Take 20 mEq daily as needed by mouth (for cramping). ), Disp: 240 mL, Rfl: 0 .  predniSONE (DELTASONE) 10 MG tablet, Take 1 tablet (10 mg total) by mouth daily with breakfast., Disp: 5 tablet, Rfl: 0 .  Probiotic Product (PROBIOTIC PO), Take 1 capsule daily as needed by mouth (FOR G.I. DISTRESS). , Disp: , Rfl:  .  sodium chloride (OCEAN) 0.65 % SOLN nasal spray, Place 1 spray into both nostrils every 4 (four) hours as needed for congestion. , Disp: ,  Rfl:  .  traMADol (ULTRAM) 50 MG tablet, 1/2-1 tid prn, Disp: , Rfl:  .  zolmitriptan (ZOMIG-ZMT) 5 MG disintegrating tablet, Take 1 tablet (5 mg total) by mouth daily as needed for migraine., Disp: 10 tablet, Rfl: 0  Current Facility-Administered Medications:  .  ipratropium-albuterol (DUONEB) 0.5-2.5 (3) MG/3ML nebulizer solution 3 mL, 3 mL, Nebulization, Q6H, Burnard Hawthorne, FNP, 3 mL at 05/18/17 1437  Past Medical History: Past Medical History:  Diagnosis Date  . Allergic rhinitis   . Anemia   . Asthma   . Complication of anesthesia    Breathing problems upon waking up. 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  . Compressed cervical disc   . COPD (chronic obstructive pulmonary disease) ([place])   . CVA (cerebral infarction)   . Dyspnea   . Esophageal dysmotility   . Heart murmur    as child  . History of hiatal hernia   . IBS (irritable bowel syndrome)   . Migraine   . PICC (peripherally inserted central catheter) removal 02/20/2017  . PONV (postoperative nausea and vomiting)   . Problems with swallowing    intermittently  . Pulmonary fibrosis ([place])   . Shingles   . Stroke [facility])    slurred speech, drawn face, imaging normal, occurred twice, [facility]-"TIA" if antything" 02/20/17-no residual effects  . Tracheostomy in place [facility])   . Vocal cord paresis     Tobacco Use: Social History   Tobacco Use  Smoking Status Never Smoker  Smokeless Tobacco Never Used    Labs: Recent Review Flowsheet Data    Labs for ITP Cardiac and Pulmonary Rehab Latest Ref Rng & Units 12/13/2012 04/30/2013 12/13/2016   Cholestrol 0 - 200 mg/dL 201(H) - 215(H)   LDLCALC 0 - 99 mg/dL - - 64   LDLDIRECT mg/dL 92.7 - -   HDL >39.00 mg/dL 91.50 - 122.30   Trlycerides 0.0 - 149.0 mg/dL 101.0 - 140.0   Hemoglobin A1c 4.6 - 6.5 % - 5.9(H) 5.4       Pulmonary Assessment Scores: Pulmonary Assessment Scores    Row Name 04/23/17 1422 06/06/17 1016       ADL UCSD   ADL  Phase  Entry  Mid    SOB Score total  28  20    Rest  0  1    Walk  2  3    Stairs  4  3    Bath  0  0    Dress  0  0    Shop  1  0      CAT Score   CAT Score  14  -      mMRC Score   mMRC Score  2  -       Pulmonary Function Assessment: Pulmonary Function Assessment - 04/23/17 1426      Breath  Bilateral Breath Sounds  Clear;Decreased    Shortness of Breath  Yes more on long walks she gets short of breath       Exercise Target Goals:    Exercise Program Goal: Individual exercise prescription set using results from initial 6 min walk test and THRR while considering  patient's activity barriers and safety.    Exercise Prescription Goal: Initial exercise prescription builds to 30-45 minutes a day of aerobic activity, 2-3 days per week.  Home exercise guidelines will be given to patient during program as part of exercise prescription that the participant will acknowledge.  Activity Barriers & Risk Stratification: Activity Barriers & Cardiac Risk Stratification - 04/23/17 1540      Activity Barriers & Cardiac Risk Stratification   Activity Barriers  Neck/Spine Problems;Deconditioning;Muscular Weakness;Shortness of Breath;Other (comment)    Comments  cervical spine fusions and hardwares (surgey x3), neuropathy in hands       6 Minute Walk: 6 Minute Walk    Row Name 04/23/17 1535         6 Minute Walk   Phase  Initial     Distance  1380 feet     Walk Time  6 minutes     # of Rest Breaks  0     MPH  2.61     METS  3.69     RPE  15     Perceived Dyspnea   3.5     VO2 Peak  12.9     Symptoms  Yes (comment)     Comments  wheezing with breathing and SOB     Resting HR  64 bpm     Resting BP  132/64     Resting Oxygen Saturation   98 %     Exercise Oxygen Saturation  during 6 min walk  95 %     Max Ex. HR  64 bpm     Max Ex. BP  138/64     2 Minute Post BP  126/60       Interval HR   1 Minute HR  96     2 Minute HR  102     3 Minute HR  103     4 Minute  HR  99     5 Minute HR  104     6 Minute HR  102     2 Minute Post HR  75     Interval Heart Rate?  Yes       Interval Oxygen   Interval Oxygen?  Yes     Baseline Oxygen Saturation %  98 %     1 Minute Oxygen Saturation %  - equipment error     1 Minute Liters of Oxygen  0 L Room Air     2 Minute Oxygen Saturation %  98 %     2 Minute Liters of Oxygen  0 L     3 Minute Oxygen Saturation %  97 %     3 Minute Liters of Oxygen  0 L     4 Minute Oxygen Saturation %  95 %     4 Minute Liters of Oxygen  0 L     5 Minute Oxygen Saturation %  100 %     5 Minute Liters of Oxygen  0 L     6 Minute Oxygen Saturation %  97 %     6 Minute Liters of Oxygen  0 L  2 Minute Post Oxygen Saturation %  100 %     2 Minute Post Liters of Oxygen  0 L       Oxygen Initial Assessment: Oxygen Initial Assessment - 04/23/17 1436      Home Oxygen   Home Oxygen Device  None    Sleep Oxygen Prescription  None    Home Exercise Oxygen Prescription  None    Home at Rest Exercise Oxygen Prescription  None      Initial 6 min Walk   Oxygen Used  None      Program Oxygen Prescription   Program Oxygen Prescription  None      Intervention   Short Term Goals  To learn and demonstrate proper pursed lip breathing techniques or other breathing techniques.;To learn and understand importance of monitoring SPO2 with pulse oximeter and demonstrate accurate use of the pulse oximeter.;To learn and demonstrate proper use of respiratory medications;To learn and understand importance of maintaining oxygen saturations>88% she uses her albuterol [name] or nebulizer as needed.    Long  Term Goals  Verbalizes importance of monitoring SPO2 with pulse oximeter and return demonstration;Maintenance of O2 saturations>88%;Exhibits proper breathing techniques, such as pursed lip breathing or other method taught during program session;Compliance with respiratory medication;Demonstrates proper use of MDI's       Oxygen  Re-Evaluation: Oxygen Re-Evaluation    Row Name 04/27/17 1014 05/16/17 1038           Program Oxygen Prescription   Program Oxygen Prescription  -  None        Home Oxygen   Home Oxygen Device  -  None      Sleep Oxygen Prescription  -  None      Home Exercise Oxygen Prescription  -  None      Home at Rest Exercise Oxygen Prescription  -  None        Goals/Expected Outcomes   Short Term Goals  To learn and demonstrate proper pursed lip breathing techniques or other breathing techniques.;To learn and understand importance of monitoring SPO2 with pulse oximeter and demonstrate accurate use of the pulse oximeter.;To learn and demonstrate proper use of respiratory medications;To learn and understand importance of maintaining oxygen saturations>88%  To learn and demonstrate proper pursed lip breathing techniques or other breathing techniques.;To learn and understand importance of monitoring SPO2 with pulse oximeter and demonstrate accurate use of the pulse oximeter.;To learn and demonstrate proper use of respiratory medications;To learn and understand importance of maintaining oxygen saturations>88%      Long  Term Goals  Verbalizes importance of monitoring SPO2 with pulse oximeter and return demonstration;Maintenance of O2 saturations>88%;Exhibits proper breathing techniques, such as pursed lip breathing or other method taught during program session;Compliance with respiratory medication;Demonstrates proper use of MDI's  Verbalizes importance of monitoring SPO2 with pulse oximeter and return demonstration;Maintenance of O2 saturations>88%;Exhibits proper breathing techniques, such as pursed lip breathing or other method taught during program session;Compliance with respiratory medication;Demonstrates proper use of MDI's      Comments  Reviewed PLB technique with pt.  Talked about how it work and it's important to maintaining Anne exercise saturations.    Anne [name] uses her rescue [name] and nebulizer  as needed. She does not have a pulse oximeter to check her oxygen at home. Informed patient where to get one and about how much they cost.  Patient verbalizes understanding.      Goals/Expected Outcomes  Short: Become more profiecient at using PLB.   Long:  Become independent at using PLB.  Short: obtain a pulse oximeter for home. Long: monitor oxygen at home independently.         Oxygen Discharge (Final Oxygen Re-Evaluation): Oxygen Re-Evaluation - 05/16/17 1038      Program Oxygen Prescription   Program Oxygen Prescription  None      Home Oxygen   Home Oxygen Device  None    Sleep Oxygen Prescription  None    Home Exercise Oxygen Prescription  None    Home at Rest Exercise Oxygen Prescription  None      Goals/Expected Outcomes   Short Term Goals  To learn and demonstrate proper pursed lip breathing techniques or other breathing techniques.;To learn and understand importance of monitoring SPO2 with pulse oximeter and demonstrate accurate use of the pulse oximeter.;To learn and demonstrate proper use of respiratory medications;To learn and understand importance of maintaining oxygen saturations>88%    Long  Term Goals  Verbalizes importance of monitoring SPO2 with pulse oximeter and return demonstration;Maintenance of O2 saturations>88%;Exhibits proper breathing techniques, such as pursed lip breathing or other method taught during program session;Compliance with respiratory medication;Demonstrates proper use of MDI's    Comments  Anne [name] uses her rescue [name] and nebulizer as needed. She does not have a pulse oximeter to check her oxygen at home. Informed patient where to get one and about how much they cost.  Patient verbalizes understanding.    Goals/Expected Outcomes  Short: obtain a pulse oximeter for home. Long: monitor oxygen at home independently.       Initial Exercise Prescription: Initial Exercise Prescription - 04/23/17 1500      Date of Initial Exercise RX and Referring  Provider   Date  04/23/17    Referring Provider  Merton Border MD      Treadmill   MPH  2.6    Grade  1    Minutes  15    METs  3.35      NuStep   Level  2    SPM  80    Minutes  15    METs  3      REL-XR   Level  1    Speed  50    Minutes  15    METs  3      Prescription Details   Frequency (times per week)  3    Duration  Progress to 45 minutes of aerobic exercise without signs/symptoms of physical distress      Intensity   THRR 40-80% of Max Heartrate  100-135    Ratings of Perceived Exertion  11-13    Perceived Dyspnea  0-4      Progression   Progression  Continue to progress workloads to maintain intensity without signs/symptoms of physical distress.      Resistance Training   Training Prescription  Yes    Weight  3 lbs    Reps  10-15       Perform Capillary Blood Glucose checks as needed.  Exercise Prescription Changes: Exercise Prescription Changes    Row Name 04/23/17 1500 05/02/17 1100 05/09/17 1200 05/09/17 1300 05/23/17 1500     Response to Exercise   Blood Pressure (Admit)  132/64  -  124/64  -  106/64   Blood Pressure (Exercise)  138/64  -  110/60  -  -   Blood Pressure (Exit)  126/60  -  96/58  -  104/64   Heart Rate (Admit)  64 bpm  -  81  bpm  -  87 bpm   Heart Rate (Exercise)  104 bpm  -  86 bpm  -  107 bpm   Heart Rate (Exit)  75 bpm  -  80 bpm  -  61 bpm   Oxygen Saturation (Admit)  98 %  -  94 %  -  96 %   Oxygen Saturation (Exercise)  95 %  -  93 %  -  90 %   Oxygen Saturation (Exit)  100 %  -  98 %  -  97 %   Rating of Perceived Exertion (Exercise)  15  -  14  -  13   Perceived Dyspnea (Exercise)  3.5  -  2  -  3   Symptoms  wheezing with breathing, SOB  -  -  -  -   Comments  walk test results  -  -  -  -   Duration  -  -  Continue with 45 min of aerobic exercise without signs/symptoms of physical distress.  -  Continue with 45 min of aerobic exercise without signs/symptoms of physical distress.   Intensity  -  -  THRR unchanged  -   THRR unchanged     Progression   Progression  -  -  Continue to progress workloads to maintain intensity without signs/symptoms of physical distress.  -  Continue to progress workloads to maintain intensity without signs/symptoms of physical distress.   Average METs  -  -  4.15  -  3.05     Resistance Training   Training Prescription  -  -  Yes  -  Yes   Weight  -  -  3 lb  -  3 lb   Reps  -  -  10-15  -  10-15     Interval Training   Interval Training  -  -  No  -  No     Treadmill   MPH  -  -  -  -  3   Grade  -  -  -  -  1   Minutes  -  -  -  -  15   METs  -  -  -  -  3.71     NuStep   Level  -  -  2  -  2   SPM  -  -  80  -  80   Minutes  -  -  15  -  15   METs  -  -  2.4  -  2.4     REL-XR   Level  -  -  1  -  -   Speed  -  -  50  -  -   Minutes  -  -  15  -  -   METs  -  -  5.2  -  -     Home Exercise Plan   Plans to continue exercise at  -  [employer] (comment) [place]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  -  Add 2 additional days to program exercise sessions.  Add 2 additional days to program exercise sessions.   Initial Home Exercises Provided  -  05/02/17  -  05/02/17  05/02/17   Row Name 06/06/17  1100             Response to Exercise   Blood Pressure (Admit)  112/58       Blood Pressure (Exit)  116/62       Heart Rate (Admit)  87 bpm       Heart Rate (Exercise)  114 bpm       Heart Rate (Exit)  85 bpm       Oxygen Saturation (Admit)  94 %       Oxygen Saturation (Exercise)  99 %       Oxygen Saturation (Exit)  100 %       Rating of Perceived Exertion (Exercise)  14       Perceived Dyspnea (Exercise)  3       Duration  Continue with 45 min of aerobic exercise without signs/symptoms of physical distress.       Intensity  THRR unchanged         Progression   Progression  Continue to progress  workloads to maintain intensity without signs/symptoms of physical distress.       Average METs  5.5         Resistance Training   Training Prescription  Yes       Weight  3 lb       Reps  10-15         Interval Training   Interval Training  No         REL-XR   Level  3 leg press program intervals       Speed  60       Minutes  15       METs  8.1         Home Exercise Plan   Plans to continue exercise at  [employer] (comment) [place] and walk       Frequency  Add 2 additional days to program exercise sessions.       Initial Home Exercises Provided  05/02/17          Exercise Comments: Exercise Comments    Row Name 04/27/17 1013           Exercise Comments  First full day of exercise!  Patient was oriented to gym and equipment including functions, settings, policies, and procedures.  Patient's individual exercise prescription and treatment plan were reviewed.  All starting workloads were established based on the results of the 6 minute walk test done at initial orientation visit.  The plan for exercise progression was also introduced and progression will be customized based on patient's performance and goals.          Exercise Goals and Review: Exercise Goals    Row Name 04/23/17 1542             Exercise Goals   Increase Physical Activity  Yes       Intervention  Provide advice, education, support and counseling about physical activity/exercise needs.;Develop an individualized exercise prescription for aerobic and resistive training based on initial evaluation findings, risk stratification, comorbidities and participant's personal goals.       Expected Outcomes  Short Term: Attend rehab on a regular basis to increase amount of physical activity.;Long Term: Add in home exercise to make exercise part of routine and to increase amount of physical activity.;Long Term: Exercising regularly at least 3-5 days a week.       Increase Strength and Stamina  Yes  Intervention  Provide advice, education, support and counseling about physical activity/exercise needs.;Develop an individualized exercise prescription for aerobic and resistive training based on initial evaluation findings, risk stratification, comorbidities and participant's personal goals.       Expected Outcomes  Short Term: Increase workloads from initial exercise prescription for resistance, speed, and METs.;Short Term: Perform resistance training exercises routinely during rehab and add in resistance training at home;Long Term: Improve cardiorespiratory fitness, muscular endurance and strength as measured by increased METs and functional capacity (6MWT)       Able to understand and use rate of perceived exertion (RPE) scale  Yes       Intervention  Provide education and explanation on how to use RPE scale       Expected Outcomes  Short Term: Able to use RPE daily in rehab to express subjective intensity level;Long Term:  Able to use RPE to guide intensity level when exercising independently       Able to understand and use Dyspnea scale  Yes       Intervention  Provide education and explanation on how to use Dyspnea scale       Expected Outcomes  Short Term: Able to use Dyspnea scale daily in rehab to express subjective sense of shortness of breath during exertion;Long Term: Able to use Dyspnea scale to guide intensity level when exercising independently       Knowledge and understanding of Target Heart Rate Range (THRR)  Yes       Intervention  Provide education and explanation of THRR including how the numbers were predicted and where they are located for reference       Expected Outcomes  Short Term: Able to state/look up THRR;Short Term: Able to use daily as guideline for intensity in rehab;Long Term: Able to use THRR to govern intensity when exercising independently       Able to check pulse independently  Yes       Intervention  Provide education and demonstration on how to check pulse in  carotid and radial arteries.;Review the importance of being able to check your own pulse for safety during independent exercise       Expected Outcomes  Short Term: Able to explain why pulse checking is important during independent exercise;Long Term: Able to check pulse independently and accurately       Understanding of Exercise Prescription  Yes       Intervention  Provide education, explanation, and written materials on patient's individual exercise prescription       Expected Outcomes  Short Term: Able to explain program exercise prescription;Long Term: Able to explain home exercise prescription to exercise independently          Exercise Goals Re-Evaluation : Exercise Goals Re-Evaluation    Row Name 04/27/17 1014 05/02/17 1111 05/09/17 1309 05/23/17 1505 06/06/17 1128     Exercise Goal Re-Evaluation   Exercise Goals Review  Understanding of Exercise Prescription;Able to understand and use Dyspnea scale;Knowledge and understanding of Target Heart Rate Range (THRR);Able to understand and use rate of perceived exertion (RPE) scale  Increase Physical Activity;Increase Strength and Stamina;Able to understand and use rate of perceived exertion (RPE) scale;Able to check pulse independently;Knowledge and understanding of Target Heart Rate Range (THRR);Able to understand and use Dyspnea scale  Increase Physical Activity;Increase Strength and Stamina;Able to understand and use rate of perceived exertion (RPE) scale;Knowledge and understanding of Target Heart Rate Range (THRR)  Increase Physical Activity;Able to understand and use rate of perceived exertion (  RPE) scale;Knowledge and understanding of Target Heart Rate Range (THRR);Increase Strength and Stamina;Able to understand and use Dyspnea scale  Increase Physical Activity;Increase Strength and Stamina;Able to understand and use rate of perceived exertion (RPE) scale;Knowledge and understanding of Target Heart Rate Range (THRR)   Comments  Reviewed RPE  scale, THR and program prescription with pt today.  Pt voiced understanding and was given a copy of goals to take home.   Reviewed home exercise - Pt attends dancercise classses and walk.  She also has clearance from her Dr for weights no more than 10-20 lb (neck surgery) She also walks at home  Pt is tolerating exercise well.  She has no restriction sother than her tolerance for strength training.     Anne [name] is progressing well with exercise.  Staff will continue to monitor progress.  Anne [name] added :15 intervals of higher resistance on the XR.  She enjoyed the challenge and will continue with this addition to her program.  She is reaching the target HR and RPE.   Expected Outcomes  Short: Use RPE daily to regulate intensity.  Long: Follow program prescription in THR.  Short - PT will continue dance class and walk on other day at home  Long - Pt will maintain exercise on her own  Short - Anne [name] will continue to attend and particpate in her Dancercise class.  Long - Anne [name] will improve upper body strength and cardiovascular endurance  Short - Anne [name] wil continue to attend Long - Anne [name] will build overall MET level  Anne [name] will continue with intervals and add them to NS as well Long - Pt will continue to improve MET levels      Discharge Exercise Prescription (Final Exercise Prescription Changes): Exercise Prescription Changes - 06/06/17 1100      Response to Exercise   Blood Pressure (Admit)  112/58    Blood Pressure (Exit)  116/62    Heart Rate (Admit)  87 bpm    Heart Rate (Exercise)  114 bpm    Heart Rate (Exit)  85 bpm    Oxygen Saturation (Admit)  94 %    Oxygen Saturation (Exercise)  99 %    Oxygen Saturation (Exit)  100 %    Rating of Perceived Exertion (Exercise)  14    Perceived Dyspnea (Exercise)  3    Duration  Continue with 45 min of aerobic exercise without signs/symptoms of physical distress.    Intensity  THRR unchanged      Progression   Progression  Continue to  progress workloads to maintain intensity without signs/symptoms of physical distress.    Average METs  5.5      Resistance Training   Training Prescription  Yes    Weight  3 lb    Reps  10-15      Interval Training   Interval Training  No      REL-XR   Level  3 leg press program intervals    Speed  60    Minutes  15    METs  8.1      Home Exercise Plan   Plans to continue exercise at  [employer] (comment) [place] and walk    Frequency  Add 2 additional days to program exercise sessions.    Initial Home Exercises Provided  05/02/17       Nutrition:  Target Goals: Understanding of nutrition guidelines, daily intake of sodium '1500mg'$ , cholesterol '200mg'$ , calories 30% from fat and 7% or less from  saturated fats, daily to have 5 or more servings of fruits and vegetables.  Biometrics: Pre Biometrics - 04/23/17 1543      Pre Biometrics   Height  5' 3.2" (1.605 m)    Weight  105 lb 1.6 oz (47.7 kg)    Waist Circumference  24 inches    Hip Circumference  34.5 inches    Waist to Hip Ratio  0.7 %    BMI (Calculated)  18.51        Nutrition Therapy Plan and Nutrition Goals: Nutrition Therapy & Goals - 04/23/17 1420      Personal Nutrition Goals   Comments  She cannot eat alot at one time. Some things are hard for her to swallow. She has learned how to eat over the years for her to be comfortable.      Intervention Plan   Intervention  Prescribe, educate and counsel regarding individualized specific dietary modifications aiming towards targeted core components such as weight, hypertension, lipid management, diabetes, heart failure and other comorbidities.;Nutrition handout(s) given to patient.    Expected Outcomes  Short Term Goal: Understand basic principles of dietary content, such as calories, fat, sodium, cholesterol and nutrients.;Long Term Goal: Adherence to prescribed nutrition plan.       Nutrition Assessments: Nutrition Assessments - 04/23/17 1441       MEDFICTS Scores   Pre Score  28       Nutrition Goals Re-Evaluation: Nutrition Goals Re-Evaluation    Row Name 05/16/17 1043             Goals   Current Weight  102 lb (46.3 kg)       Nutrition Goal  Increase enegery, maintain or gain a couple pounds.       Comment  She has been put on protien, low carbs and low sugar diet. Her wound doctor has talked to her about her diet habits. It is hard for her to eat a good amount of food due to her trach and the muscles in her neck for swallowing.  She has met with several dieticians and she is not interseted in another dietician appointment.        Expected Outcome  Short: increase food intake. Long: maintain weight or gain a few pounds.          Nutrition Goals Discharge (Final Nutrition Goals Re-Evaluation): Nutrition Goals Re-Evaluation - 05/16/17 1043      Goals   Current Weight  102 lb (46.3 kg)    Nutrition Goal  Increase enegery, maintain or gain a couple pounds.    Comment  She has been put on protien, low carbs and low sugar diet. Her wound doctor has talked to her about her diet habits. It is hard for her to eat a good amount of food due to her trach and the muscles in her neck for swallowing.  She has met with several dieticians and she is not interseted in another dietician appointment.     Expected Outcome  Short: increase food intake. Long: maintain weight or gain a few pounds.       Psychosocial: Target Goals: Acknowledge presence or absence of significant depression and/or stress, maximize coping skills, provide positive support system. Participant is able to verbalize types and ability to use techniques and skills needed for reducing stress and depression.   Initial Review & Psychosocial Screening: Initial Psych Review & Screening - 04/23/17 1418      Initial Review   Current issues with  None  Identified      Family Dynamics   Good Support System?  Yes    Comments  She had a trach for 20 years and know what is  normal with her breathing. Her husband and her 4 children are great for support.      Barriers   Psychosocial barriers to participate in program  There are no identifiable barriers or psychosocial needs.      Screening Interventions   Interventions  Encouraged to exercise;To provide support and resources with identified psychosocial needs;Provide feedback about the scores to participant    Expected Outcomes  Short Term goal: Utilizing psychosocial counselor, staff and physician to assist with identification of specific Stressors or current issues interfering with healing process. Setting desired goal for each stressor or current issue identified.;Long Term Goal: Stressors or current issues are controlled or eliminated.;Long Term goal: The participant improves quality of Life and PHQ9 Scores as seen by post scores and/or verbalization of changes;Short Term goal: Identification and review with participant of any Quality of Life or Depression concerns found by scoring the questionnaire.       Quality of Life Scores:  Scores of 19 and below usually indicate a poorer quality of life in these areas.  A difference of  2-3 points is a clinically meaningful difference.  A difference of 2-3 points in the total score of the Quality of Life Index has been associated with significant improvement in overall quality of life, self-image, physical symptoms, and general health in studies assessing change in quality of life.  PHQ-9: Recent Review Flowsheet Data    Depression screen [facility] 2/9 06/06/2017 04/23/2017 12/13/2016 10/28/2015 11/20/2014   Decreased Interest 0 0 0 0 0   Down, Depressed, Hopeless 0 0 0 0 0   PHQ - 2 Score 0 0 0 0 0   Altered sleeping 1 3 - - -   Tired, decreased energy 1 1 - - -   Change in appetite 0 0 - - -   Feeling bad or failure about yourself  0 0 - - -   Trouble concentrating 0 0 - - -   Moving slowly or fidgety/restless 0 0 - - -   Suicidal thoughts 0 0 - - -   PHQ-9 Score 2 4 - - -    Difficult doing work/chores Not difficult at all Not difficult at all - - -     Interpretation of Total Score  Total Score Depression Severity:  1-4 = Minimal depression, 5-9 = Mild depression, 10-14 = Moderate depression, 15-19 = Moderately severe depression, 20-27 = Severe depression   Psychosocial Evaluation and Intervention: Psychosocial Evaluation - 05/02/17 1142      Psychosocial Evaluation & Interventions   Interventions  Encouraged to exercise with the program and follow exercise prescription;Stress management education;Relaxation education    Comments  Counselor met with Anne [name]) today for initial psychosocial evaluation.  She is a 67 year old who has asthma and a Trach inserted in [place] (several months ago.  She has a strong support system with a spouse and adult children who live close by. Guillermo also is actively involved in her local church and volunteers at [employer].  She reports not sleeping well since the surgery and difficulty swallowing - which impact her appetite.  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  She states she is  typically in a positive mood most of the time and has minimal stress other than her health and a brother who is in a treatment facility for alcohol out of state.  Teleshia has goals to increase her stamina and strength while in this class and get back to consistently exercising.  Staff will follow.    Expected Outcomes  Anne [name] will benefit from consistent exercise to achieve her stated goals.  The educational and psychoeducational components of this program will be helpful in understanding her condition and coping more positively.  Monitor sleep for improved health.    Continue Psychosocial Services   Follow up required by staff       Psychosocial Re-Evaluation: Psychosocial Re-Evaluation    Anne [name] Name 05/16/17 1047              Psychosocial Re-Evaluation   Current issues with  Current Sleep Concerns       Comments  Her sleep has been difficult due to her neck hurting. She is talking to her doctor about her muscle spasms in her neck. She can turn her neck to the right but cannot turn her neck all the way to the left.       Expected Outcomes  Short: talk with her doctor about her sleep. Long: maintain good sleep independently       Interventions  Encouraged to attend Pulmonary Rehabilitation for the exercise       Continue Psychosocial Services   Follow up required by staff          Psychosocial Discharge (Final Psychosocial Re-Evaluation): Psychosocial Re-Evaluation - 05/16/17 1047      Psychosocial Re-Evaluation   Current issues with  Current Sleep Concerns    Comments  Her sleep has been difficult due to her neck hurting. She is talking to her doctor about her muscle spasms in her neck. She can turn her neck to the right but cannot turn her neck all the way to the left.    Expected Outcomes  Short: talk with her doctor about her sleep. Long: maintain good sleep independently    Interventions  Encouraged to attend Pulmonary Rehabilitation for the exercise    Continue Psychosocial Services   Follow up required by staff       Education: Education Goals: Education classes will be provided on a weekly basis, covering required topics. Participant will state understanding/return demonstration of topics presented.  Learning Barriers/Preferences: Learning Barriers/Preferences - 04/23/17 1431      Learning Barriers/Preferences   Learning Barriers  Sight wears glasses    Learning Preferences  None       Education Topics:  Initial Evaluation Education: - Verbal, written and demonstration of respiratory meds, oximetry and breathing techniques. Instruction on use of nebulizers and MDIs and importance of monitoring MDI activations.   Pulmonary Rehab from 06/06/2017 in [facility] Cardiac and Pulmonary Rehab  Date   04/23/17  Educator  [facility]  Instruction Review Code  1- Verbalizes Understanding      General Nutrition Guidelines/Fats and Fiber: -Group instruction provided by verbal, written material, models and posters to present the general guidelines for heart healthy nutrition. Gives an explanation and review of dietary fats and fiber.   Controlling Sodium/Reading Food Labels: -Group verbal and written material supporting the discussion of sodium use in heart healthy nutrition. Review and explanation with models, verbal and written materials for utilization of the food label.   Pulmonary Rehab from 06/06/2017 in [facility] Cardiac and Pulmonary Rehab  Date  04/30/17  Educator  PI  Instruction Review Code  1- Verbalizes Understanding      Exercise Physiology & General Exercise Guidelines: - Group verbal and written instruction with models to review the exercise physiology of the cardiovascular system and associated critical values. Provides general exercise guidelines with specific guidelines to those with heart or lung disease.    Aerobic Exercise & Resistance Training: - Gives group verbal and written instruction on the various components of exercise. Focuses on aerobic and resistive training programs and the benefits of this training and how to safely progress through these programs.   Flexibility, Balance, Mind/Body Relaxation: Provides group verbal/written instruction on the benefits of flexibility and balance training, including mind/body exercise modes such as yoga, pilates and tai chi.  Demonstration and skill practice provided.   Pulmonary Rehab from 06/06/2017 in [facility] Cardiac and Pulmonary Rehab  Date  05/09/17  Educator  AS  Instruction Review Code  1- Verbalizes Understanding      Stress and Anxiety: - Provides group verbal and written instruction about the health risks of elevated stress and causes of high stress.  Discuss the correlation between heart/lung disease and anxiety and  treatment options. Review healthy ways to manage with stress and anxiety.   Pulmonary Rehab from 06/06/2017 in [facility] Cardiac and Pulmonary Rehab  Date  05/30/17  Educator  [place]  Instruction Review Code  1- Verbalizes Understanding      Depression: - Provides group verbal and written instruction on the correlation between heart/lung disease and depressed mood, treatment options, and the stigmas associated with seeking treatment.   Pulmonary Rehab from 06/06/2017 in [facility] Cardiac and Pulmonary Rehab  Date  05/16/17  Educator  [facility]  Instruction Review Code  1- Verbalizes Understanding      Exercise & Equipment Safety: - Individual verbal instruction and demonstration of equipment use and safety with use of the equipment.   Pulmonary Rehab from 06/06/2017 in [facility] Cardiac and Pulmonary Rehab  Date  04/23/17  Educator  [facility]  Instruction Review Code  1- Verbalizes Understanding      Infection Prevention: - Provides verbal and written material to individual with discussion of infection control including proper hand washing and proper equipment cleaning during exercise session.   Pulmonary Rehab from 06/06/2017 in [facility] Cardiac and Pulmonary Rehab  Date  04/23/17  Educator  [facility]  Instruction Review Code  1- Verbalizes Understanding      Falls Prevention: - Provides verbal and written material to individual with discussion of falls prevention and safety.   Pulmonary Rehab from 06/06/2017 in [facility] Cardiac and Pulmonary Rehab  Date  04/23/17  Educator  [facility]  Instruction Review Code  1- Verbalizes Understanding      Diabetes: - Individual verbal and written instruction to review signs/symptoms of diabetes, desired ranges of glucose level fasting, after meals and with exercise. Advice that pre and post exercise glucose checks will be done for 3 sessions at entry of program.   Chronic Lung Diseases: - Group verbal and written instruction to review updates, respiratory medications, advancements in  procedures and treatments. Discuss use of supplemental oxygen including available portable oxygen systems, continuous and intermittent flow rates, concentrators, personal use and safety guidelines. Review proper use of [name] and spacers. Provide informative websites for self-education.    Energy Conservation: - Provide group verbal and written instruction for methods to conserve energy, plan and organize activities. Instruct on pacing techniques, use of adaptive equipment and posture/positioning to relieve shortness of breath.   Pulmonary  Rehab from 06/06/2017 in [facility] Cardiac and Pulmonary Rehab  Date  05/23/17  Educator  [facility]  Instruction Review Code  1- Verbalizes Understanding      Triggers and Exacerbations: - Group verbal and written instruction to review types of environmental triggers and ways to prevent exacerbations. Discuss weather changes, air quality and the benefits of nasal washing. Review warning signs and symptoms to help prevent infections. Discuss techniques for effective airway clearance, coughing, and vibrations.   AED/CPR: - Group verbal and written instruction with the use of models to demonstrate the basic use of the AED with the basic ABC's of resuscitation.   Pulmonary Rehab from 06/06/2017 in [facility] Cardiac and Pulmonary Rehab  Date  05/25/17  Educator  [facility]  Instruction Review Code  1- Actuary and Physiology of the Lungs: - Group verbal and written instruction with the use of models to provide basic lung anatomy and physiology related to function, structure and complications of lung disease.   Pulmonary Rehab from 06/06/2017 in [facility] Cardiac and Pulmonary Rehab  Date  06/06/17  Educator  [facility]  Instruction Review Code  1- Verbalizes Understanding      Anatomy & Physiology of the Heart: - Group verbal and written instruction and models provide basic cardiac anatomy and physiology, with the coronary electrical and arterial systems. Review  of Valvular disease and Heart Failure   Cardiac Medications: - Group verbal and written instruction to review commonly prescribed medications for heart disease. Reviews the medication, class of the drug, and side effects.   Pulmonary Rehab from 06/06/2017 in [facility] Cardiac and Pulmonary Rehab  Date  05/11/17  Educator  [facility]  Instruction Review Code  1- Verbalizes Understanding      Know Your Numbers and Risk Factors: -Group verbal and written instruction about important numbers in your health.  Discussion of what are risk factors and how they play a role in the disease process.  Review of Cholesterol, Blood Pressure, Diabetes, and BMI and the role they play in your overall health.   Sleep Hygiene: -Provides group verbal and written instruction about how sleep can affect your health.  Define sleep hygiene, discuss sleep cycles and impact of sleep habits. Review good sleep hygiene tips.    Other: -Provides group and verbal instruction on various topics (see comments)    Knowledge Questionnaire Score: Knowledge Questionnaire Score - 04/23/17 1427      Knowledge Questionnaire Score   Pre Score  14/18 reviewed with patient        Core Components/Risk Factors/Patient Goals at Admission: Personal Goals and Risk Factors at Admission - 04/23/17 1443      Core Components/Risk Factors/Patient Goals on Admission    Weight Management  Yes    Intervention  Weight Management: Develop a combined nutrition and exercise program designed to reach desired caloric intake, while maintaining appropriate intake of nutrient and fiber, sodium and fats, and appropriate energy expenditure required for the weight goal.;Weight Management/Obesity: Establish reasonable short term and long term weight goals.;Weight Management: Provide education and appropriate resources to help participant work on and attain dietary goals.    Admit Weight  105 lb (47.6 kg)    Goal Weight: Short Term  105 lb (47.6 kg)    Goal  Weight: Long Term  105 lb (47.6 kg)    Expected Outcomes  Short Term: Continue to assess and modify interventions until short term weight is achieved;Long Term: Adherence to nutrition and physical activity/exercise program  aimed toward attainment of established weight goal;Weight Maintenance: Understanding of the daily nutrition guidelines, which includes 25-35% calories from fat, 7% or less cal from saturated fats, less than '200mg'$  cholesterol, less than 1.5gm of sodium, & 5 or more servings of fruits and vegetables daily    Improve shortness of breath with ADL's  Yes    Intervention  Provide education, individualized exercise plan and daily activity instruction to help decrease symptoms of SOB with activities of daily living.    Expected Outcomes  Short Term: Improve cardiorespiratory fitness to achieve a reduction of symptoms when performing ADLs       Core Components/Risk Factors/Patient Goals Review:  Goals and Risk Factor Review    Row Name 05/16/17 1026             Core Components/Risk Factors/Patient Goals Review   Personal Goals Review  Weight Management/Obesity;Improve shortness of breath with ADL's       Review  Adesuwa has been doing well in [facility] and has been improving with her shortness of breath. Her blood pressure has been within normal limits. Her weight has been steady. She weighs 102 pounds. She is not trying to lose weight and watchers her weight closely.  Reaching and straining are some things that give her trouble breathing. Most of her household activities do not give her shortness of breath.       Expected Outcomes  Short: watch weight closely.  Long: Maintain current weight           Core Components/Risk Factors/Patient Goals at Discharge (Final Review):  Goals and Risk Factor Review - 05/16/17 1026      Core Components/Risk Factors/Patient Goals Review   Personal Goals Review  Weight Management/Obesity;Improve shortness of breath with ADL's    Review  Akeila  has been doing well in [facility] and has been improving with her shortness of breath. Her blood pressure has been within normal limits. Her weight has been steady. She weighs 102 pounds. She is not trying to lose weight and watchers her weight closely.  Reaching and straining are some things that give her trouble breathing. Most of her household activities do not give her shortness of breath.    Expected Outcomes  Short: watch weight closely.  Long: Maintain current weight        ITP Comments: ITP Comments    Row Name 04/23/17 1403 04/23/17 1445 05/07/17 0916 06/04/17 0825 06/18/17 0825   ITP Comments  Medical Evaluation completed. Chart sent for review and changes to Dr. Emily Filbert Director of [place]. Diagnosis can be found in [facility] encounter 04/23/17  patient is on Plavix to prevent stroke due to a hospital scare. She had slurred speech at one point but none of her test came back positive for a stroke.  30 day review completed. ITP sent to Dr. Emily Filbert Director of [place]. Continue with ITP unless changes are made by physician.  30 day review completed. ITP sent to Dr. Emily Filbert Director of [place]. Continue with ITP unless changes are made by physician.  Anne [name] called to say she will not be in [place] today due to a death in the family.   Anne [name] Name 06/27/17 1022 07/02/17 0829         ITP Comments  Anne [name] is back today after being out for awhile. She had a tooth pulled, an infection presented itself in her foot and she could not ambulate well. She is participating in [place] today and is doing good.  30 day review completed. ITP sent to Dr. Emily Filbert Director of [place]. Continue with ITP unless changes are made by physician         Comments: 30 day review

## 2017-07-02 NOTE — Progress Notes (Signed)
Daily Session Note  Patient Details  Name: Anne [name] MRN: [id number] Date of Birth: 10/05/1950 Referring Provider:     Pulmonary Rehab from 04/23/2017 in [facility] Cardiac and Pulmonary Rehab  Referring Provider  Merton Border MD      Encounter Date: 07/02/2017  Check In: Session Check In - 07/02/17 1009      Check-In   Location  [facility]-Cardiac & Pulmonary Rehab    Staff Present  Nada Maclachlan, BA, ACSM CEP, Exercise Physiologist;Kelly Amedeo Plenty, BS, ACSM CEP, Exercise Physiologist;Naomy Esham Flavia Shipper    Supervising physician immediately available to respond to emergencies  [facility] immediately available ER MD    Physician(s)  Dr. Kerman Passey and Corky Downs    Medication changes reported      No    Fall or balance concerns reported     No    Tobacco Cessation  No Change    Warm-up and Cool-down  Performed as group-led instruction    Resistance Training Performed  Yes    VAD Patient?  No      Pain Assessment   Currently in Pain?  No/denies          Social History   Tobacco Use  Smoking Status Never Smoker  Smokeless Tobacco Never Used    Goals Met:  Independence with exercise equipment Exercise tolerated well No report of cardiac concerns or symptoms Strength training completed today  Goals Unmet:  Not Applicable  Comments: Pt able to follow exercise prescription today without complaint.  Will continue to monitor for progression.   Dr. Emily Filbert is Medical Director for [place] and [facility].

## 2017-07-04 DIAGNOSIS — J8410 Pulmonary fibrosis, unspecified: Secondary | ICD-10-CM | POA: Diagnosis not present

## 2017-07-04 NOTE — Progress Notes (Signed)
Daily Session Note  Patient Details  Name: Anne [name] MRN: [id number] Date of Birth: 01-12-1951 Referring Provider:     Pulmonary Rehab from 04/23/2017 in [facility] Cardiac and Pulmonary Rehab  Referring Provider  Merton Border MD      Encounter Date: 07/04/2017  Check In: Session Check In - 07/04/17 1034      Check-In   Location  [facility]Cardiac & Pulmonary Rehab    Staff Present  Alberteen Sam, MA, RCEP, CCRP, Exercise Physiologist;Rayven Rettig Oletta Darter, BA, ACSM CEP, Exercise Physiologist;Joseph Flavia Shipper    Supervising physician immediately available to respond to emergencies  [facility] immediately available ER MD    Physician(s)  Jimmye Norman and Clearnce Hasten    Medication changes reported      No    Fall or balance concerns reported     No    Warm-up and Cool-down  Performed as group-led Higher education careers adviser Performed  Yes    VAD Patient?  No      Pain Assessment   Currently in Pain?  No/denies    Multiple Pain Sites  No          Social History   Tobacco Use  Smoking Status Never Smoker  Smokeless Tobacco Never Used    Goals Met:  Proper associated with RPD/PD & O2 Sat Independence with exercise equipment Exercise tolerated well Strength training completed today  Goals Unmet:  Not Applicable  Comments: Pt able to follow exercise prescription today without complaint.  Will continue to monitor for progression.    Dr. Emily Filbert is Medical Director for [place] and [facility].

## 2017-07-06 DIAGNOSIS — J8410 Pulmonary fibrosis, unspecified: Secondary | ICD-10-CM | POA: Diagnosis not present

## 2017-07-06 NOTE — Progress Notes (Signed)
Daily Session Note  Patient Details  Name: Anne [name] MRN: [id number] Date of Birth: 17-Oct-1950 Referring Provider:     Pulmonary Rehab from 04/23/2017 in [facility] Cardiac and Pulmonary Rehab  Referring Provider  Merton Border MD      Encounter Date: 07/06/2017  Check In: Session Check In - 07/06/17 1003      Check-In   Location  [facility]Cardiac & Pulmonary Rehab    Staff Present  Justin Mend Lindell Spar, BA, ACSM CEP, Exercise Physiologist;Meredith Sherryll Burger, RN BSN    Supervising physician immediately available to respond to emergencies  [facility] immediately available ER MD    Physician(s)  Dr. Alfred Levins and [facility]    Medication changes reported      No    Fall or balance concerns reported     No    Tobacco Cessation  No Change    Warm-up and Cool-down  Performed as group-led instruction    Resistance Training Performed  Yes    VAD Patient?  No      Pain Assessment   Currently in Pain?  No/denies          Social History   Tobacco Use  Smoking Status Never Smoker  Smokeless Tobacco Never Used    Goals Met:  Independence with exercise equipment Exercise tolerated well No report of cardiac concerns or symptoms Strength training completed today  Goals Unmet:  Not Applicable  Comments: Pt able to follow exercise prescription today without complaint.  Will continue to monitor for progression.   Dr. Emily Filbert is Medical Director for [place] and [facility].

## 2017-07-09 DIAGNOSIS — J8410 Pulmonary fibrosis, unspecified: Secondary | ICD-10-CM | POA: Diagnosis not present

## 2017-07-09 NOTE — Progress Notes (Signed)
Daily Session Note  Patient Details  Name: Anne [name] MRN: [id number] Date of Birth: 06-12-1950 Referring Provider:     Pulmonary Rehab from 04/23/2017 in [facility] Cardiac and Pulmonary Rehab  Referring Provider  Merton Border MD      Encounter Date: 07/09/2017  Check In: Session Check In - 07/09/17 0956      Check-In   Location  [facility]-Cardiac & Pulmonary Rehab    Staff Present  Earlean Shawl, BS, ACSM CEP, Exercise Physiologist;Amanda Oletta Darter, BA, ACSM CEP, Exercise Physiologist;Akoni Parton Flavia Shipper    Supervising physician immediately available to respond to emergencies  [facility] immediately available ER MD    Physician(s)   Dr. Burlene Arnt and Joni Fears    Medication changes reported      No    Fall or balance concerns reported     No    Tobacco Cessation  No Change    Warm-up and Cool-down  Performed as group-led instruction    Resistance Training Performed  Yes    VAD Patient?  No      Pain Assessment   Currently in Pain?  No/denies          Social History   Tobacco Use  Smoking Status Never Smoker  Smokeless Tobacco Never Used    Goals Met:  Independence with exercise equipment Exercise tolerated well No report of cardiac concerns or symptoms Strength training completed today  Goals Unmet:  Not Applicable  Comments: Pt able to follow exercise prescription today without complaint.  Will continue to monitor for progression.   Dr. Emily Filbert is Medical Director for [place] and [facility].

## 2017-07-11 DIAGNOSIS — J8410 Pulmonary fibrosis, unspecified: Secondary | ICD-10-CM | POA: Diagnosis not present

## 2017-07-11 NOTE — Progress Notes (Signed)
Daily Session Note  Patient Details  Name: Anne [name] MRN: [id number] Date of Birth: 06-26-1950 Referring Provider:     Pulmonary Rehab from 04/23/2017 in [facility] Cardiac and Pulmonary Rehab  Referring Provider  Merton Border MD      Encounter Date: 07/11/2017  Check In: Session Check In - 07/11/17 0957      Check-In   Location  [facility]Cardiac & Pulmonary Rehab    Staff Present  Justin Mend RCP,RRT,BSRT;Amanda Oletta Darter, BA, ACSM CEP, Exercise Physiologist;Jessica Luan Pulling, MA, RCEP, CCRP, Exercise Physiologist    Supervising physician immediately available to respond to emergencies  [facility] immediately available ER MD    Physician(s)  Dr. Owens Shark and Jimmye Norman    Medication changes reported      No    Fall or balance concerns reported     No    Tobacco Cessation  No Change    Warm-up and Cool-down  Performed as group-led instruction    Resistance Training Performed  Yes    VAD Patient?  No      Pain Assessment   Currently in Pain?  No/denies          Social History   Tobacco Use  Smoking Status Never Smoker  Smokeless Tobacco Never Used    Goals Met:  Independence with exercise equipment Exercise tolerated well No report of cardiac concerns or symptoms Strength training completed today  Goals Unmet:  Not Applicable  Comments: Pt able to follow exercise prescription today without complaint.  Will continue to monitor for progression.   Dr. Emily Filbert is Medical Director for [place] and [facility].

## 2017-07-13 ENCOUNTER — Encounter: Payer: Medicare Other | Admitting: *Deleted

## 2017-07-13 DIAGNOSIS — J8410 Pulmonary fibrosis, unspecified: Secondary | ICD-10-CM

## 2017-07-13 NOTE — Progress Notes (Signed)
Daily Session Note  Patient Details  Name: Anne [name] MRN: [id number] Date of Birth: June 10, 1950 Referring Provider:     Pulmonary Rehab from 04/23/2017 in [facility] Cardiac and Pulmonary Rehab  Referring Provider  Merton Border MD      Encounter Date: 07/13/2017  Check In: Session Check In - 07/13/17 1001      Check-In   Location  [facility]Cardiac & Pulmonary Rehab    Staff Present  Alberteen Sam, MA, RCEP, CCRP, Exercise Physiologist;Meredith Sherryll Burger, RN BSN;Mandi Ballard, BS, [place] physician immediately available to respond to emergencies  [facility] immediately available ER MD    Physician(s)  Drs. Owens Shark and Lord    Medication changes reported      No    Fall or balance concerns reported     No    Warm-up and Cool-down  Performed as group-led Higher education careers adviser Performed  Yes    VAD Patient?  No      Pain Assessment   Currently in Pain?  No/denies          Social History   Tobacco Use  Smoking Status Never Smoker  Smokeless Tobacco Never Used    Goals Met:  Independence with exercise equipment Exercise tolerated well No report of cardiac concerns or symptoms Strength training completed today  Goals Unmet:  Not Applicable  Comments: Pt able to follow exercise prescription today without complaint.  Will continue to monitor for progression.    Dr. Emily Filbert is Medical Director for [place] and [facility].

## 2017-07-18 DIAGNOSIS — J8410 Pulmonary fibrosis, unspecified: Secondary | ICD-10-CM

## 2017-07-18 NOTE — Progress Notes (Signed)
Daily Session Note  Patient Details  Name: Anne [name] MRN: [id number] Date of Birth: 04-16-1950 Referring Provider:     Pulmonary Rehab from 04/23/2017 in [facility] Cardiac and Pulmonary Rehab  Referring Provider  Merton Border MD      Encounter Date: 07/18/2017  Check In: Session Check In - 07/18/17 1026      Check-In   Location  [facility]Cardiac & Pulmonary Rehab    Staff Present  Justin Mend RCP,RRT,BSRT;Amanda Oletta Darter, BA, ACSM CEP, Exercise Physiologist;Jessica Luan Pulling, MA, RCEP, CCRP, Exercise Physiologist    Supervising physician immediately available to respond to emergencies  [facility] immediately available ER MD    Physician(s)  Dr. Archie Balboa and Joni Fears    Medication changes reported      No    Fall or balance concerns reported     No    Tobacco Cessation  No Change    Warm-up and Cool-down  Performed as group-led instruction    Resistance Training Performed  Yes    VAD Patient?  No      Pain Assessment   Currently in Pain?  No/denies          Social History   Tobacco Use  Smoking Status Never Smoker  Smokeless Tobacco Never Used    Goals Met:  Independence with exercise equipment Exercise tolerated well No report of cardiac concerns or symptoms Strength training completed today  Goals Unmet:  Not Applicable  Comments: Pt able to follow exercise prescription today without complaint.  Will continue to monitor for progression.   Dr. Emily Filbert is Medical Director for [place] and [facility].

## 2017-07-20 DIAGNOSIS — J8410 Pulmonary fibrosis, unspecified: Secondary | ICD-10-CM

## 2017-07-20 NOTE — Progress Notes (Signed)
Daily Session Note  Patient Details  Name: Anne [name] MRN: [id number] Date of Birth: February 19, 1951 Referring Provider:     Pulmonary Rehab from 04/23/2017 in [facility] Cardiac and Pulmonary Rehab  Referring Provider  Merton Border MD      Encounter Date: 07/20/2017  Check In: Session Check In - 07/20/17 1012      Check-In   Location  [facility]Cardiac & Pulmonary Rehab    Staff Present  Renita Papa, RN Vickki Hearing, BA, ACSM CEP, Exercise Physiologist;Joseph Flavia Shipper    Supervising physician immediately available to respond to emergencies  [facility] immediately available ER MD    Physician(s)  Dr. Jimmye Norman and Corky Downs    Medication changes reported      No    Fall or balance concerns reported     No    Tobacco Cessation  No Change    Warm-up and Cool-down  Performed as group-led instruction    Resistance Training Performed  Yes    VAD Patient?  No      Pain Assessment   Currently in Pain?  No/denies          Social History   Tobacco Use  Smoking Status Never Smoker  Smokeless Tobacco Never Used    Goals Met:  Proper associated with RPD/PD & O2 Sat Independence with exercise equipment Using PLB without cueing & demonstrates good technique Exercise tolerated well No report of cardiac concerns or symptoms Strength training completed today  Goals Unmet:  Not Applicable  Comments: Pt able to follow exercise prescription today without complaint.  Will continue to monitor for progression.    Dr. Emily Filbert is Medical Director for [place] and [facility].

## 2017-07-23 DIAGNOSIS — J8410 Pulmonary fibrosis, unspecified: Secondary | ICD-10-CM | POA: Diagnosis not present

## 2017-07-23 NOTE — Progress Notes (Signed)
Daily Session Note  Patient Details  Name: Anne [name] MRN: [id number] Date of Birth: 12-11-1950 Referring Provider:     Pulmonary Rehab from 04/23/2017 in [facility] Cardiac and Pulmonary Rehab  Referring Provider  Merton Border MD      Encounter Date: 07/23/2017  Check In: Session Check In - 07/23/17 1040      Check-In   Location  [facility]Cardiac & Pulmonary Rehab    Staff Present  Earlean Shawl, BS, ACSM CEP, Exercise Physiologist;Laureen Owens Shark, BS, RRT, Respiratory Therapist;Davarius Ridener Anton[place] physician immediately available to respond to emergencies  [facility] immediately available ER MD    Physician(s)  Dr. Corky Downs and Alfred Levins    Medication changes reported      No    Fall or balance concerns reported     No    Tobacco Cessation  No Change    Warm-up and Cool-down  Performed as group-led instruction    Resistance Training Performed  Yes    VAD Patient?  No      Pain Assessment   Currently in Pain?  No/denies          Social History   Tobacco Use  Smoking Status Never Smoker  Smokeless Tobacco Never Used    Goals Met:  Independence with exercise equipment Exercise tolerated well No report of cardiac concerns or symptoms Strength training completed today  Goals Unmet:  Not Applicable  Comments: Pt able to follow exercise prescription today without complaint.  Will continue to monitor for progression.   Dr. Emily Filbert is Medical Director for [place] and [facility].

## 2017-07-25 ENCOUNTER — Encounter: Payer: Medicare Other | Attending: Pulmonary Disease

## 2017-07-25 DIAGNOSIS — Z8673 Personal history of transient ischemic attack (TIA), and cerebral infarction without residual deficits: Secondary | ICD-10-CM | POA: Diagnosis not present

## 2017-07-25 DIAGNOSIS — Z930 Tracheostomy status: Secondary | ICD-10-CM | POA: Insufficient documentation

## 2017-07-25 DIAGNOSIS — J3800 Paralysis of vocal cords and larynx, unspecified: Secondary | ICD-10-CM | POA: Diagnosis not present

## 2017-07-25 DIAGNOSIS — J8410 Pulmonary fibrosis, unspecified: Secondary | ICD-10-CM

## 2017-07-25 DIAGNOSIS — J449 Chronic obstructive pulmonary disease, unspecified: Secondary | ICD-10-CM | POA: Diagnosis not present

## 2017-07-25 DIAGNOSIS — Z79899 Other long term (current) drug therapy: Secondary | ICD-10-CM | POA: Insufficient documentation

## 2017-07-25 NOTE — Progress Notes (Signed)
Daily Session Note  Patient Details  Name: Anne [name] MRN: [id number] Date of Birth: 1950/11/20 Referring Provider:     Pulmonary Rehab from 04/23/2017 in [facility] Cardiac and Pulmonary Rehab  Referring Provider  Merton Border MD      Encounter Date: 07/25/2017  Check In: Session Check In - 07/25/17 0953      Check-In   Location  [facility]Cardiac & Pulmonary Rehab    Staff Present  Nada Maclachlan, BA, ACSM CEP, Exercise Physiologist;Santhiago [place] Darrin Nipper, [place], RCEP, CCRP, Exercise Physiologist    Supervising physician immediately available to respond to emergencies  [facility] immediately available ER MD    Physician(s)  Dr. Alfred Levins and Burlene Arnt    Medication changes reported      No    Fall or balance concerns reported     No    Tobacco Cessation  No Change    Warm-up and Cool-down  Performed as group-led instruction    Resistance Training Performed  Yes    VAD Patient?  No      Pain Assessment   Currently in Pain?  No/denies          Social History   Tobacco Use  Smoking Status Never Smoker  Smokeless Tobacco Never Used    Goals Met:  Independence with exercise equipment Exercise tolerated well No report of cardiac concerns or symptoms Strength training completed today  Goals Unmet:  Not Applicable  Comments: Pt able to follow exercise prescription today without complaint.  Will continue to monitor for progression.   Dr. Emily Filbert is Medical Director for [place] and [facility].

## 2017-07-27 ENCOUNTER — Other Ambulatory Visit: Payer: Self-pay | Admitting: Family Medicine

## 2017-07-27 DIAGNOSIS — J8410 Pulmonary fibrosis, unspecified: Secondary | ICD-10-CM

## 2017-07-27 NOTE — Telephone Encounter (Signed)
I looked up patient on [place] Controlled Substances Reporting System and saw no activity that raised concern of inappropriate use.   Refilled out of courtesy.

## 2017-07-27 NOTE — Progress Notes (Signed)
Daily Session Note  Patient Details  Name: Anne [name] MRN: [id number] Date of Birth: [birth year] Referring Provider:     Pulmonary Rehab from 04/23/2017 in [facility] Cardiac and Pulmonary Rehab  Referring Provider  Merton Border MD      Encounter Date: 07/27/2017  Check In: Session Check In - 07/27/17 1003      Check-In   Location  [facility]Cardiac [facility] Pulmonary Rehab    Staff Present  Justin Mend Lindell Spar, BA, ACSM CEP, Exercise Physiologist;Meredith Sherryll Burger, RN BSN    Supervising physician immediately available to respond to emergencies  [facility] immediately available ER MD    Physician(s)  Dr. Alfred Levins and [facility]    Medication changes reported      No    Fall or balance concerns reported     No    Tobacco Cessation  No Change    Warm-up and Cool-down  Performed as group-led instruction    Resistance Training Performed  Yes    VAD Patient?  No      Pain Assessment   Currently in Pain?  No/denies          Social History   Tobacco Use  Smoking Status Never Smoker  Smokeless Tobacco Never Used    Goals Met:  Independence with exercise equipment Exercise tolerated well No report of cardiac concerns or symptoms Strength training completed today  Goals Unmet:  Not Applicable  Comments: Pt able to follow exercise prescription today without complaint.  Will continue to monitor for progression.   Dr. Emily Filbert is Medical Director for [place] and [facility].

## 2017-07-27 NOTE — Telephone Encounter (Signed)
Last refill 06/25/17 Last office visit 03/19/17 Next office visit 08/03/17

## 2017-07-30 ENCOUNTER — Other Ambulatory Visit: Payer: Self-pay | Admitting: Family Medicine

## 2017-07-30 DIAGNOSIS — J8410 Pulmonary fibrosis, unspecified: Secondary | ICD-10-CM

## 2017-07-30 NOTE — Telephone Encounter (Signed)
Last OV 03/19/17 filed under historical

## 2017-07-30 NOTE — Progress Notes (Signed)
Pulmonary Individual Treatment Plan  Patient Details  Name: Anne [name] MRN: [id number] Date of Birth: 10/16/1950 Referring Provider:     Pulmonary Rehab from 04/23/2017 in [facility] Cardiac and Pulmonary Rehab  Referring Provider  Merton Border MD      Initial Encounter Date:    Pulmonary Rehab from 04/23/2017 in [facility] Cardiac and Pulmonary Rehab  Date  04/23/17  Referring Provider  Merton Border MD      Visit Diagnosis: Pulmonary fibrosis, postinflammatory (Door)  Patient's Home Medications on Admission:  Current Outpatient Medications:  .  acetaminophen (TYLENOL) 500 MG tablet, Take 500 mg by mouth daily as needed for moderate pain or headache., Disp: , Rfl:  .  albuterol (PROVENTIL HFA;VENTOLIN HFA) 108 (90 Base) MCG/ACT inhaler, Inhale 2 puffs into the lungs every 6 (six) hours as needed for wheezing or shortness of breath., Disp: 1 Inhaler, Rfl: 5 .  ALPRAZolam (XANAX) 0.25 MG tablet, TAKE ONE-HALF TABLET BY MOUTH TWICE DAILY AS NEEDED, Disp: 30 tablet, Rfl: 0 .  amoxicillin (AMOXIL) 500 MG capsule, Take 1 capsule (500 mg total) by mouth 2 (two) times daily., Disp: 14 capsule, Rfl: 0 .  cetirizine (ZYRTEC) 10 MG tablet, TAKE ONE TABLET EVERY DAY, Disp: 30 tablet, Rfl: 5 .  clopidogrel (PLAVIX) 75 MG tablet, TAKE ONE TABLET BY MOUTH EVERY MORNING, Disp: 90 tablet, Rfl: 1 .  EPINEPHrine (EPIPEN 2-PAK) 0.3 mg/0.3 mL IJ SOAJ injection, Inject 0.3 mLs (0.3 mg total) into the muscle once. (Patient taking differently: Inject 0.3 mg once as needed into the muscle (FOR AN ALLERGIC REACTION). ), Disp: 1 Device, Rfl: 0 .  estradiol (ESTRACE) 1 MG tablet, TAKE ONE TABLET BY MOUTH EVERY DAY, Disp: 90 tablet, Rfl: 1 .  fluticasone (FLONASE) 50 MCG/ACT nasal spray, PLACE 2 SPRAYS INTO BOTH NOSTRILS DAILY. (Patient taking differently: Place 2 sprays into both nostrils daily as needed for allergies. ), Disp: 16 g, Rfl: 2 .  furosemide (LASIX) 20 MG tablet, TAKE ONE TABLET EVERY DAY AS NEEDED, Disp:  90 tablet, Rfl: 1 .  hydroxypropyl methylcellulose (ISOPTO TEARS) 2.5 % ophthalmic solution, Place 1 drop into both eyes 3 (three) times daily as needed for dry eyes., Disp: , Rfl:  .  ipratropium-albuterol (DUONEB) 0.5-2.5 (3) MG/3ML SOLN, Take 3 mLs by nebulization every 4 (four) hours as needed. Dx 496, Disp: 120 mL, Rfl: 2 .  linaclotide (LINZESS) 72 MCG capsule, Take 1 capsule (72 mcg total) by mouth daily before breakfast., Disp: 30 capsule, Rfl: 3 .  Liniments (SALONPAS PAIN RELIEF PATCH EX), Apply 1-2 patches daily as needed topically (FOR SHOULDER PAIN). , Disp: , Rfl:  .  methocarbamol (ROBAXIN) 500 MG tablet, Take 1 tablet (500 mg total) by mouth every 6 (six) hours as needed for muscle spasms., Disp: 60 tablet, Rfl: 0 .  montelukast (SINGULAIR) 10 MG tablet, Take 10 mg by mouth at bedtime. , Disp: , Rfl:  .  mupirocin ointment (BACTROBAN) 2 %, Apply 1 application daily as needed topically (FOR [facility] SITE IRRITATION). , Disp: , Rfl:  .  oxyCODONE (OXY IR/ROXICODONE) 5 MG immediate release tablet, Take 1 tablet (5 mg total) by mouth every 6 (six) hours as needed for breakthrough pain., Disp: 30 tablet, Rfl: 0 .  pantoprazole (PROTONIX) 40 MG tablet, Take 1 tablet (40 mg total) by mouth daily., Disp: 90 tablet, Rfl: 0 .  pantoprazole (PROTONIX) 40 MG tablet, TAKE ONE TABLET EVERY DAY, Disp: 90 tablet, Rfl: 1 .  potassium chloride 20 MEQ/15ML (10%)  SOLN, Take 15 mLs (20 mEq total) by mouth daily as needed (cramps). (Patient taking differently: Take 20 mEq daily as needed by mouth (for cramping). ), Disp: 240 mL, Rfl: 0 .  predniSONE (DELTASONE) 10 MG tablet, Take 1 tablet (10 mg total) by mouth daily with breakfast., Disp: 5 tablet, Rfl: 0 .  Probiotic Product (PROBIOTIC PO), Take 1 capsule daily as needed by mouth (FOR G.I. DISTRESS). , Disp: , Rfl:  .  sodium chloride (OCEAN) 0.65 % SOLN nasal spray, Place 1 spray into both nostrils every 4 (four) hours as needed for congestion. , Disp: ,  Rfl:  .  traMADol (ULTRAM) 50 MG tablet, 1/2-1 tid prn, Disp: , Rfl:  .  zolmitriptan (ZOMIG-ZMT) 5 MG disintegrating tablet, Take 1 tablet (5 mg total) by mouth daily as needed for migraine., Disp: 10 tablet, Rfl: 0  Current Facility-Administered Medications:  .  ipratropium-albuterol (DUONEB) 0.5-2.5 (3) MG/3ML nebulizer solution 3 mL, 3 mL, Nebulization, Q6H, Burnard Hawthorne, FNP, 3 mL at 05/18/17 1437  Past Medical History: Past Medical History:  Diagnosis Date  . Allergic rhinitis   . Anemia   . Asthma   . Complication of anesthesia    Breathing problems upon waking up. 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  . Compressed cervical disc   . COPD (chronic obstructive pulmonary disease) ([place])   . CVA (cerebral infarction)   . Dyspnea   . Esophageal dysmotility   . Heart murmur    as child  . History of hiatal hernia   . IBS (irritable bowel syndrome)   . Migraine   . PICC (peripherally inserted central catheter) removal 02/20/2017  . PONV (postoperative nausea and vomiting)   . Problems with swallowing    intermittently  . Pulmonary fibrosis ([place])   . Shingles   . Stroke [facility])    slurred speech, drawn face, imaging normal, occurred twice, [facility]-"TIA" if antything" 02/20/17-no residual effects  . Tracheostomy in place [facility])   . Vocal cord paresis     Tobacco Use: Social History   Tobacco Use  Smoking Status Never Smoker  Smokeless Tobacco Never Used    Labs: Recent Review Flowsheet Data    Labs for ITP Cardiac and Pulmonary Rehab Latest Ref Rng & Units 12/13/2012 04/30/2013 12/13/2016   Cholestrol 0 - 200 mg/dL 201(H) - 215(H)   LDLCALC 0 - 99 mg/dL - - 64   LDLDIRECT mg/dL 92.7 - -   HDL >39.00 mg/dL 91.50 - 122.30   Trlycerides 0.0 - 149.0 mg/dL 101.0 - 140.0   Hemoglobin A1c 4.6 - 6.5 % - 5.9(H) 5.4       Pulmonary Assessment Scores: Pulmonary Assessment Scores    Row Name 04/23/17 1422 06/06/17 1016       ADL UCSD   ADL  Phase  Entry  Mid    SOB Score total  28  20    Rest  0  1    Walk  2  3    Stairs  4  3    Bath  0  0    Dress  0  0    Shop  1  0      CAT Score   CAT Score  14  -      mMRC Score   mMRC Score  2  -       Pulmonary Function Assessment: Pulmonary Function Assessment - 04/23/17 1426      Breath  Bilateral Breath Sounds  Clear;Decreased    Shortness of Breath  Yes more on long walks she gets short of breath       Exercise Target Goals:    Exercise Program Goal: Individual exercise prescription set using results from initial 6 min walk test and THRR while considering  patient's activity barriers and safety.    Exercise Prescription Goal: Initial exercise prescription builds to 30-45 minutes a day of aerobic activity, 2-3 days per week.  Home exercise guidelines will be given to patient during program as part of exercise prescription that the participant will acknowledge.  Activity Barriers & Risk Stratification: Activity Barriers & Cardiac Risk Stratification - 04/23/17 1540      Activity Barriers & Cardiac Risk Stratification   Activity Barriers  Neck/Spine Problems;Deconditioning;Muscular Weakness;Shortness of Breath;Other (comment)    Comments  cervical spine fusions and hardwares (surgey x3), neuropathy in hands       6 Minute Walk: 6 Minute Walk    Row Name 04/23/17 1535         6 Minute Walk   Phase  Initial     Distance  1380 feet     Walk Time  6 minutes     # of Rest Breaks  0     MPH  2.61     METS  3.69     RPE  15     Perceived Dyspnea   3.5     VO2 Peak  12.9     Symptoms  Yes (comment)     Comments  wheezing with breathing and SOB     Resting HR  64 bpm     Resting BP  132/64     Resting Oxygen Saturation   98 %     Exercise Oxygen Saturation  during 6 min walk  95 %     Max Ex. HR  64 bpm     Max Ex. BP  138/64     2 Minute Post BP  126/60       Interval HR   1 Minute HR  96     2 Minute HR  102     3 Minute HR  103     4 Minute  HR  99     5 Minute HR  104     6 Minute HR  102     2 Minute Post HR  75     Interval Heart Rate?  Yes       Interval Oxygen   Interval Oxygen?  Yes     Baseline Oxygen Saturation %  98 %     1 Minute Oxygen Saturation %  - equipment error     1 Minute Liters of Oxygen  0 L Room Air     2 Minute Oxygen Saturation %  98 %     2 Minute Liters of Oxygen  0 L     3 Minute Oxygen Saturation %  97 %     3 Minute Liters of Oxygen  0 L     4 Minute Oxygen Saturation %  95 %     4 Minute Liters of Oxygen  0 L     5 Minute Oxygen Saturation %  100 %     5 Minute Liters of Oxygen  0 L     6 Minute Oxygen Saturation %  97 %     6 Minute Liters of Oxygen  0 L  2 Minute Post Oxygen Saturation %  100 %     2 Minute Post Liters of Oxygen  0 L       Oxygen Initial Assessment: Oxygen Initial Assessment - 04/23/17 1436      Home Oxygen   Home Oxygen Device  None    Sleep Oxygen Prescription  None    Home Exercise Oxygen Prescription  None    Home at Rest Exercise Oxygen Prescription  None      Initial 6 min Walk   Oxygen Used  None      Program Oxygen Prescription   Program Oxygen Prescription  None      Intervention   Short Term Goals  To learn and demonstrate proper pursed lip breathing techniques or other breathing techniques.;To learn and understand importance of monitoring SPO2 with pulse oximeter and demonstrate accurate use of the pulse oximeter.;To learn and demonstrate proper use of respiratory medications;To learn and understand importance of maintaining oxygen saturations>88% she uses her albuterol inhaler or nebulizer as needed.    Long  Term Goals  Verbalizes importance of monitoring SPO2 with pulse oximeter and return demonstration;Maintenance of O2 saturations>88%;Exhibits proper breathing techniques, such as pursed lip breathing or other method taught during program session;Compliance with respiratory medication;Demonstrates proper use of MDI's       Oxygen  Re-Evaluation: Oxygen Re-Evaluation    Row Name 04/27/17 1014 05/16/17 1038 07/11/17 1039         Program Oxygen Prescription   Program Oxygen Prescription  -  None  None       Home Oxygen   Home Oxygen Device  -  None  None     Sleep Oxygen Prescription  -  None  None     Home Exercise Oxygen Prescription  -  None  None     Home at Rest Exercise Oxygen Prescription  -  None  None       Goals/Expected Outcomes   Short Term Goals  To learn and demonstrate proper pursed lip breathing techniques or other breathing techniques.;To learn and understand importance of monitoring SPO2 with pulse oximeter and demonstrate accurate use of the pulse oximeter.;To learn and demonstrate proper use of respiratory medications;To learn and understand importance of maintaining oxygen saturations>88%  To learn and demonstrate proper pursed lip breathing techniques or other breathing techniques.;To learn and understand importance of monitoring SPO2 with pulse oximeter and demonstrate accurate use of the pulse oximeter.;To learn and demonstrate proper use of respiratory medications;To learn and understand importance of maintaining oxygen saturations>88%  To learn and demonstrate proper pursed lip breathing techniques or other breathing techniques.;To learn and understand importance of monitoring SPO2 with pulse oximeter and demonstrate accurate use of the pulse oximeter.;To learn and demonstrate proper use of respiratory medications;To learn and understand importance of maintaining oxygen saturations>88%     Long  Term Goals  Verbalizes importance of monitoring SPO2 with pulse oximeter and return demonstration;Maintenance of O2 saturations>88%;Exhibits proper breathing techniques, such as pursed lip breathing or other method taught during program session;Compliance with respiratory medication;Demonstrates proper use of MDI's  Verbalizes importance of monitoring SPO2 with pulse oximeter and return  demonstration;Maintenance of O2 saturations>88%;Exhibits proper breathing techniques, such as pursed lip breathing or other method taught during program session;Compliance with respiratory medication;Demonstrates proper use of MDI's  Verbalizes importance of monitoring SPO2 with pulse oximeter and return demonstration;Maintenance of O2 saturations>88%;Exhibits proper breathing techniques, such as pursed lip breathing or other method taught during program session;Compliance with respiratory medication;Demonstrates  proper use of MDI's     Comments  Reviewed PLB technique with pt.  Talked about how it work and it's important to maintaining his exercise saturations.    Anne [name] uses her rescue inhaler and nebulizer as needed. She does not have a pulse oximeter to check her oxygen at home. Informed patient where to get one and about how much they cost.  Patient verbalizes understanding.  Almetta has not obtained a pulse oximeter yet but plans to get one. Her shortness of breath has improved slightly but her strength has improved alot. She knows that her oxygen should be 88 percent and above. We talked about what to do if she gets short of breath and when to take a breathing treatment. She will take a breathing treatment when she feels like she is getting tight.     Goals/Expected Outcomes  Short: Become more profiecient at using PLB.   Long: Become independent at using PLB.  Short: obtain a pulse oximeter for home. Long: monitor oxygen at home independently.  Short: obtain a pulse oximeter. Long: check oxygen at home independently.        Oxygen Discharge (Final Oxygen Re-Evaluation): Oxygen Re-Evaluation - 07/11/17 1039      Program Oxygen Prescription   Program Oxygen Prescription  None      Home Oxygen   Home Oxygen Device  None    Sleep Oxygen Prescription  None    Home Exercise Oxygen Prescription  None    Home at Rest Exercise Oxygen Prescription  None      Goals/Expected Outcomes   Short Term  Goals  To learn and demonstrate proper pursed lip breathing techniques or other breathing techniques.;To learn and understand importance of monitoring SPO2 with pulse oximeter and demonstrate accurate use of the pulse oximeter.;To learn and demonstrate proper use of respiratory medications;To learn and understand importance of maintaining oxygen saturations>88%    Long  Term Goals  Verbalizes importance of monitoring SPO2 with pulse oximeter and return demonstration;Maintenance of O2 saturations>88%;Exhibits proper breathing techniques, such as pursed lip breathing or other method taught during program session;Compliance with respiratory medication;Demonstrates proper use of MDI's    Comments  Anne [name] has not obtained a pulse oximeter yet but plans to get one. Her shortness of breath has improved slightly but her strength has improved alot. She knows that her oxygen should be 88 percent and above. We talked about what to do if she gets short of breath and when to take a breathing treatment. She will take a breathing treatment when she feels like she is getting tight.    Goals/Expected Outcomes  Short: obtain a pulse oximeter. Long: check oxygen at home independently.       Initial Exercise Prescription: Initial Exercise Prescription - 04/23/17 1500      Date of Initial Exercise RX and Referring Provider   Date  04/23/17    Referring Provider  Merton Border MD      Treadmill   MPH  2.6    Grade  1    Minutes  15    METs  3.35      NuStep   Level  2    SPM  80    Minutes  15    METs  3      REL-XR   Level  1    Speed  50    Minutes  15    METs  3      Prescription Details   Frequency (times  per week)  3    Duration  Progress to 45 minutes of aerobic exercise without signs/symptoms of physical distress      Intensity   THRR 40-80% of Max Heartrate  100-135    Ratings of Perceived Exertion  11-13    Perceived Dyspnea  0-4      Progression   Progression  Continue to progress  workloads to maintain intensity without signs/symptoms of physical distress.      Resistance Training   Training Prescription  Yes    Weight  3 lbs    Reps  10-15       Perform Capillary Blood Glucose checks as needed.  Exercise Prescription Changes: Exercise Prescription Changes    Row Name 04/23/17 1500 05/02/17 1100 05/09/17 1200 05/09/17 1300 05/23/17 1500     Response to Exercise   Blood Pressure (Admit)  132/64  -  124/64  -  106/64   Blood Pressure (Exercise)  138/64  -  110/60  -  -   Blood Pressure (Exit)  126/60  -  96/58  -  104/64   Heart Rate (Admit)  64 bpm  -  81 bpm  -  87 bpm   Heart Rate (Exercise)  104 bpm  -  86 bpm  -  107 bpm   Heart Rate (Exit)  75 bpm  -  80 bpm  -  61 bpm   Oxygen Saturation (Admit)  98 %  -  94 %  -  96 %   Oxygen Saturation (Exercise)  95 %  -  93 %  -  90 %   Oxygen Saturation (Exit)  100 %  -  98 %  -  97 %   Rating of Perceived Exertion (Exercise)  15  -  14  -  13   Perceived Dyspnea (Exercise)  3.5  -  2  -  3   Symptoms  wheezing with breathing, SOB  -  -  -  -   Comments  walk test results  -  -  -  -   Duration  -  -  Continue with 45 min of aerobic exercise without signs/symptoms of physical distress.  -  Continue with 45 min of aerobic exercise without signs/symptoms of physical distress.   Intensity  -  -  THRR unchanged  -  THRR unchanged     Progression   Progression  -  -  Continue to progress workloads to maintain intensity without signs/symptoms of physical distress.  -  Continue to progress workloads to maintain intensity without signs/symptoms of physical distress.   Average METs  -  -  4.15  -  3.05     Resistance Training   Training Prescription  -  -  Yes  -  Yes   Weight  -  -  3 lb  -  3 lb   Reps  -  -  10-15  -  10-15     Interval Training   Interval Training  -  -  No  -  No     Treadmill   MPH  -  -  -  -  3   Grade  -  -  -  -  1   Minutes  -  -  -  -  15   METs  -  -  -  -  3.71     NuStep    Level  -  -  2  -  2   SPM  -  -  80  -  80   Minutes  -  -  15  -  15   METs  -  -  2.4  -  2.4     REL-XR   Level  -  -  1  -  -   Speed  -  -  50  -  -   Minutes  -  -  15  -  -   METs  -  -  5.2  -  -     Home Exercise Plan   Plans to continue exercise at  -  [employer] (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  -  Add 2 additional days to program exercise sessions.  Add 2 additional days to program exercise sessions.   Initial Home Exercises Provided  -  05/02/17  -  05/02/17  05/02/17   Row Name 06/06/17 1100 07/04/17 1200 07/19/17 1000         Response to Exercise   Blood Pressure (Admit)  112/58  112/60  116/66     Blood Pressure (Exit)  116/62  98/58  115/82     Heart Rate (Admit)  87 bpm  86 bpm  75 bpm     Heart Rate (Exercise)  114 bpm  124 bpm  102 bpm     Heart Rate (Exit)  85 bpm  66 bpm  82 bpm     Oxygen Saturation (Admit)  94 %  99 %  97 %     Oxygen Saturation (Exercise)  99 %  96 %  93 %     Oxygen Saturation (Exit)  100 %  99 %  100 %     Rating of Perceived Exertion (Exercise)  _0 Perceived Dyspnea (Exercise)  _1 Duration  Continue with 45 min of aerobic exercise without signs/symptoms of physical distress.  Continue with 45 min of aerobic exercise without signs/symptoms of physical distress.  Continue with 45 min of aerobic exercise without signs/symptoms of physical distress.     Intensity  THRR unchanged  THRR unchanged  THRR unchanged       Progression   Progression  Continue to progress workloads to maintain intensity without signs/symptoms of physical distress.  Continue to progress workloads to maintain intensity without signs/symptoms of physical distress.  Continue to progress workloads to maintain intensity without signs/symptoms of physical distress.      Average METs  5.5  3.5  3.6       Resistance Training   Training Prescription  Yes  Yes  Yes     Weight  3 lb  3 lb  3 lb     Reps  10-15  10-15  10-15       Interval Training   Interval Training  No  No  No       Treadmill   MPH  -  2.6  3     Grade  -  3  2     Minutes  -  15  15     METs  -  3.71  4.1       NuStep  Level  -  3  3     SPM  -  80  80     Minutes  -  15  15     METs  -  3  3.1       REL-XR   Level  3 leg press program intervals  -  -     Speed  60  -  -     Minutes  15  -  -     METs  8.1  -  -       Home Exercise Plan   Plans to continue exercise at  [employer] (comment) [place] and walk  [employer] (comment) Estate manager/land agent and walk  [employer] (comment) [employer] and walk     Frequency  Add 2 additional days to program exercise sessions.  Add 2 additional days to program exercise sessions.  Add 2 additional days to program exercise sessions.     Initial Home Exercises Provided  05/02/17  05/02/17  05/02/17        Exercise Comments: Exercise Comments    Row Name 04/27/17 1013           Exercise Comments  First full day of exercise!  Patient was oriented to gym and equipment including functions, settings, policies, and procedures.  Patient's individual exercise prescription and treatment plan were reviewed.  All starting workloads were established based on the results of the 6 minute walk test done at initial orientation visit.  The plan for exercise progression was also introduced and progression will be customized based on patient's performance and goals.          Exercise Goals and Review: Exercise Goals    Row Name 04/23/17 1542             Exercise Goals   Increase Physical Activity  Yes       Intervention  Provide advice, education, support and counseling about physical activity/exercise needs.;Develop an individualized exercise prescription for aerobic and resistive training based on initial evaluation  findings, risk stratification, comorbidities and participant's personal goals.       Expected Outcomes  Short Term: Attend rehab on a regular basis to increase amount of physical activity.;Long Term: Add in home exercise to make exercise part of routine and to increase amount of physical activity.;Long Term: Exercising regularly at least 3-5 days a week.       Increase Strength and Stamina  Yes       Intervention  Provide advice, education, support and counseling about physical activity/exercise needs.;Develop an individualized exercise prescription for aerobic and resistive training based on initial evaluation findings, risk stratification, comorbidities and participant's personal goals.       Expected Outcomes  Short Term: Increase workloads from initial exercise prescription for resistance, speed, and METs.;Short Term: Perform resistance training exercises routinely during rehab and add in resistance training at home;Long Term: Improve cardiorespiratory fitness, muscular endurance and strength as measured by increased METs and functional capacity (6MWT)       Able to understand and use rate of perceived exertion (RPE) scale  Yes       Intervention  Provide education and explanation on how to use RPE scale       Expected Outcomes  Short Term: Able to use RPE daily in rehab to express subjective intensity level;Long Term:  Able to use RPE to guide intensity level when exercising independently       Able to understand  and use Dyspnea scale  Yes       Intervention  Provide education and explanation on how to use Dyspnea scale       Expected Outcomes  Short Term: Able to use Dyspnea scale daily in rehab to express subjective sense of shortness of breath during exertion;Long Term: Able to use Dyspnea scale to guide intensity level when exercising independently       Knowledge and understanding of Target Heart Rate Range (THRR)  Yes       Intervention  Provide education and explanation of THRR including how  the numbers were predicted and where they are located for reference       Expected Outcomes  Short Term: Able to state/look up THRR;Short Term: Able to use daily as guideline for intensity in rehab;Long Term: Able to use THRR to govern intensity when exercising independently       Able to check pulse independently  Yes       Intervention  Provide education and demonstration on how to check pulse in carotid and radial arteries.;Review the importance of being able to check your own pulse for safety during independent exercise       Expected Outcomes  Short Term: Able to explain why pulse checking is important during independent exercise;Long Term: Able to check pulse independently and accurately       Understanding of Exercise Prescription  Yes       Intervention  Provide education, explanation, and written materials on patient's individual exercise prescription       Expected Outcomes  Short Term: Able to explain program exercise prescription;Long Term: Able to explain home exercise prescription to exercise independently          Exercise Goals Re-Evaluation : Exercise Goals Re-Evaluation    Row Name 04/27/17 1014 05/02/17 1111 05/09/17 1309 05/23/17 1505 06/06/17 1128     Exercise Goal Re-Evaluation   Exercise Goals Review  Understanding of Exercise Prescription;Able to understand and use Dyspnea scale;Knowledge and understanding of Target Heart Rate Range (THRR);Able to understand and use rate of perceived exertion (RPE) scale  Increase Physical Activity;Increase Strength and Stamina;Able to understand and use rate of perceived exertion (RPE) scale;Able to check pulse independently;Knowledge and understanding of Target Heart Rate Range (THRR);Able to understand and use Dyspnea scale  Increase Physical Activity;Increase Strength and Stamina;Able to understand and use rate of perceived exertion (RPE) scale;Knowledge and understanding of Target Heart Rate Range (THRR)  Increase Physical Activity;Able  to understand and use rate of perceived exertion (RPE) scale;Knowledge and understanding of Target Heart Rate Range (THRR);Increase Strength and Stamina;Able to understand and use Dyspnea scale  Increase Physical Activity;Increase Strength and Stamina;Able to understand and use rate of perceived exertion (RPE) scale;Knowledge and understanding of Target Heart Rate Range (THRR)   Comments  Reviewed RPE scale, THR and program prescription with pt today.  Pt voiced understanding and was given a copy of goals to take home.   Reviewed home exercise - Pt attends dancercise classses and walk.  She also has clearance from her Dr for weights no more than 10-20 lb (neck surgery) She also walks at home  Pt is tolerating exercise well.  She has no restriction sother than her tolerance for strength training.     Taneka is progressing well with exercise.  Staff will continue to monitor progress.  Anne [name] added :15 intervals of higher resistance on the XR.  She enjoyed the challenge and will continue with this addition to her program.  She  is reaching the target HR and RPE.   Expected Outcomes  Short: Use RPE daily to regulate intensity.  Long: Follow program prescription in THR.  Short - PT will continue dance class and walk on other day at home  Long - Pt will maintain exercise on her own  Short - Anne [name] will continue to attend and particpate in her Dancercise class.  Long - Anne [name] will improve upper body strength and cardiovascular endurance  Short - Anne [name] wil continue to attend Long - Anne [name] will build overall MET level  Anne [name] will continue with intervals and add them to NS as well Long - Pt will continue to improve MET levels   Row Name 07/04/17 1302 07/19/17 1055           Exercise Goal Re-Evaluation   Exercise Goals Review  Increase Physical Activity;Able to understand and use rate of perceived exertion (RPE) scale;Increase Strength and Stamina;Knowledge and understanding of Target Heart Rate Range  (THRR);Able to understand and use Dyspnea scale  Increase Physical Activity;Able to understand and use rate of perceived exertion (RPE) scale;Knowledge and understanding of Target Heart Rate Range (THRR);Increase Strength and Stamina;Able to understand and use Dyspnea scale      Comments  Anne [name] is progressing well even after missing almost 2 weeks.    Anne [name] continues to progress well and has improved overall MET level.      Expected Outcomes  Short - Pt wil attend regularly Long - Pt will exercise independently after completing program  Short - PT will complete LW program Long - Pt will exercise indepedently         Discharge Exercise Prescription (Final Exercise Prescription Changes): Exercise Prescription Changes - 07/19/17 1000      Response to Exercise   Blood Pressure (Admit)  116/66    Blood Pressure (Exit)  115/82    Heart Rate (Admit)  75 bpm    Heart Rate (Exercise)  102 bpm    Heart Rate (Exit)  82 bpm    Oxygen Saturation (Admit)  97 %    Oxygen Saturation (Exercise)  93 %    Oxygen Saturation (Exit)  100 %    Rating of Perceived Exertion (Exercise)  12    Perceived Dyspnea (Exercise)  1    Duration  Continue with 45 min of aerobic exercise without signs/symptoms of physical distress.    Intensity  THRR unchanged      Progression   Progression  Continue to progress workloads to maintain intensity without signs/symptoms of physical distress.    Average METs  3.6      Resistance Training   Training Prescription  Yes    Weight  3 lb    Reps  10-15      Interval Training   Interval Training  No      Treadmill   MPH  3    Grade  2    Minutes  15    METs  4.1      NuStep   Level  3    SPM  80    Minutes  15    METs  3.1      Home Exercise Plan   Plans to continue exercise at  [employer] (comment) [place] and walk    Frequency  Add 2 additional days to program exercise sessions.    Initial Home Exercises Provided  05/02/17       Nutrition:   Target Goals: Understanding of nutrition guidelines,  daily intake of sodium '1500mg'$ , cholesterol '200mg'$ , calories 30% from fat and 7% or less from saturated fats, daily to have 5 or more servings of fruits and vegetables.  Biometrics: Pre Biometrics - 04/23/17 1543      Pre Biometrics   Height  5' 3.2" (1.605 m)    Weight  105 lb 1.6 oz (47.7 kg)    Waist Circumference  24 inches    Hip Circumference  34.5 inches    Waist to Hip Ratio  0.7 %    BMI (Calculated)  18.51        Nutrition Therapy Plan and Nutrition Goals: Nutrition Therapy & Goals - 04/23/17 1420      Personal Nutrition Goals   Comments  She cannot eat alot at one time. Some things are hard for her to swallow. She has learned how to eat over the years for her to be comfortable.      Intervention Plan   Intervention  Prescribe, educate and counsel regarding individualized specific dietary modifications aiming towards targeted core components such as weight, hypertension, lipid management, diabetes, heart failure and other comorbidities.;Nutrition handout(s) given to patient.    Expected Outcomes  Short Term Goal: Understand basic principles of dietary content, such as calories, fat, sodium, cholesterol and nutrients.;Long Term Goal: Adherence to prescribed nutrition plan.       Nutrition Assessments: Nutrition Assessments - 04/23/17 1441      MEDFICTS Scores   Pre Score  28       Nutrition Goals Re-Evaluation: Nutrition Goals Re-Evaluation    [place] Name 05/16/17 1043 07/11/17 1044           Goals   Current Weight  102 lb (46.3 kg)  100 lb (45.4 kg)      Nutrition Goal  Increase enegery, maintain or gain a couple pounds.  gain a little bit of weight. Maintain weight.      Comment  She has been put on protien, low carbs and low sugar diet. Her wound doctor has talked to her about her diet habits. It is hard for her to eat a good amount of food due to her trach and the muscles in her neck for swallowing.  She  has met with several dieticians and she is not interseted in another dietician appointment.   Anne [name] states that she is busy and on the go and takes protien shakes and bars with her but does not always want to eat them. It is hard for her to gain weight with her swallowing problems. She is going to work on eating more to gain a little more weight. She does not want to be under 100 pounds.      Expected Outcome  Short: increase food intake. Long: maintain weight or gain a few pounds.  Short: gain some weight. Long: maintain weight gain.         Nutrition Goals Discharge (Final Nutrition Goals Re-Evaluation): Nutrition Goals Re-Evaluation - 07/11/17 1044      Goals   Current Weight  100 lb (45.4 kg)    Nutrition Goal  gain a little bit of weight. Maintain weight.    Comment  Anne [name] states that she is busy and on the go and takes protien shakes and bars with her but does not always want to eat them. It is hard for her to gain weight with her swallowing problems. She is going to work on eating more to gain a little more weight. She does not want to be  under 100 pounds.    Expected Outcome  Short: gain some weight. Long: maintain weight gain.       Psychosocial: Target Goals: Acknowledge presence or absence of significant depression and/or stress, maximize coping skills, provide positive support system. Participant is able to verbalize types and ability to use techniques and skills needed for reducing stress and depression.   Initial Review & Psychosocial Screening: Initial Psych Review & Screening - 04/23/17 1418      Initial Review   Current issues with  None Identified      Family Dynamics   Good Support System?  Yes    Comments  She had a trach for 20 years and know what is normal with her breathing. Her husband and her 4 children are great for support.      Barriers   Psychosocial barriers to participate in program  There are no identifiable barriers or psychosocial needs.       Screening Interventions   Interventions  Encouraged to exercise;To provide support and resources with identified psychosocial needs;Provide feedback about the scores to participant    Expected Outcomes  Short Term goal: Utilizing psychosocial counselor, staff and physician to assist with identification of specific Stressors or current issues interfering with healing process. Setting desired goal for each stressor or current issue identified.;Long Term Goal: Stressors or current issues are controlled or eliminated.;Long Term goal: The participant improves quality of Life and PHQ9 Scores as seen by post scores and/or verbalization of changes;Short Term goal: Identification and review with participant of any Quality of Life or Depression concerns found by scoring the questionnaire.       Quality of Life Scores:  Scores of 19 and below usually indicate a poorer quality of life in these areas.  A difference of  2-3 points is a clinically meaningful difference.  A difference of 2-3 points in the total score of the Quality of Life Index has been associated with significant improvement in overall quality of life, self-image, physical symptoms, and general health in studies assessing change in quality of life.  PHQ-9: Recent Review Flowsheet Data    Depression screen [facility] 2/9 06/06/2017 04/23/2017 12/13/2016 10/28/2015 11/20/2014   Decreased Interest 0 0 0 0 0   Down, Depressed, Hopeless 0 0 0 0 0   PHQ - 2 Score 0 0 0 0 0   Altered sleeping 1 3 - - -   Tired, decreased energy 1 1 - - -   Change in appetite 0 0 - - -   Feeling bad or failure about yourself  0 0 - - -   Trouble concentrating 0 0 - - -   Moving slowly or fidgety/restless 0 0 - - -   Suicidal thoughts 0 0 - - -   PHQ-9 Score 2 4 - - -   Difficult doing work/chores Not difficult at all Not difficult at all - - -     Interpretation of Total Score  Total Score Depression Severity:  1-4 = Minimal depression, 5-9 = Mild depression, 10-14 =  Moderate depression, 15-19 = Moderately severe depression, 20-27 = Severe depression   Psychosocial Evaluation and Intervention: Psychosocial Evaluation - 05/02/17 1142      Psychosocial Evaluation & Interventions   Interventions  Encouraged to exercise with the program and follow exercise prescription;Stress management education;Relaxation education    Comments  Counselor met with Anne [name]) today for initial psychosocial evaluation.  She is a 67 year old who has asthma and  a Trach inserted in [place] (several months ago.  She has a strong support system with a spouse and adult children who live close by. Rodneshia also is actively involved in her local church and volunteers at [employer].  She reports not sleeping well since the surgery and difficulty swallowing - which impact her appetite.  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  She states she is typically in a positive mood most of the time and has minimal stress other than her health and a brother who is in a treatment facility for alcohol out of state.  Mykeria has goals to increase her stamina and strength while in this class and get back to consistently exercising.  Staff will follow.    Expected Outcomes  Paquita will benefit from consistent exercise to achieve her stated goals.  The educational and psychoeducational components of this program will be helpful in understanding her condition and coping more positively.  Monitor sleep for improved health.    Continue Psychosocial Services   Follow up required by staff       Psychosocial Re-Evaluation: Psychosocial Re-Evaluation    [place] Name 05/16/17 1047 07/11/17 1049           Psychosocial Re-Evaluation   Current issues with  Current Sleep Concerns  Current Sleep Concerns      Comments  Her sleep has been difficult due to her neck hurting. She is talking to her  doctor about her muscle spasms in her neck. She can turn her neck to the right but cannot turn her neck all the way to the left.  Anne [name] sees her doctor on the 30th to discuss her pain in her back. She has been waking up in the middle of the night with pain in her back. She wants to get shots for therapy instead of surgery.       Expected Outcomes  Short: talk with her doctor about her sleep. Long: maintain good sleep independently  Short: meet with her doctor about her back pain. Long: Maintain pain pain management.      Interventions  Encouraged to attend Pulmonary Rehabilitation for the exercise  -      Continue Psychosocial Services   Follow up required by staff  Follow up required by staff         Psychosocial Discharge (Final Psychosocial Re-Evaluation): Psychosocial Re-Evaluation - 07/11/17 1049      Psychosocial Re-Evaluation   Current issues with  Current Sleep Concerns    Comments  Anne [name] sees her doctor on the 30th to discuss her pain in her back. She has been waking up in the middle of the night with pain in her back. She wants to get shots for therapy instead of surgery.     Expected Outcomes  Short: meet with her doctor about her back pain. Long: Maintain pain pain management.    Continue Psychosocial Services   Follow up required by staff       Education: Education Goals: Education classes will be provided on a weekly basis, covering required topics. Participant will state understanding/return demonstration of topics presented.  Learning Barriers/Preferences: Learning Barriers/Preferences - 04/23/17 1431      Learning Barriers/Preferences   Learning Barriers  Sight wears glasses    Learning Preferences  None       Education Topics:  Initial Evaluation Education: - Verbal, written and demonstration  of respiratory meds, oximetry and breathing techniques. Instruction on use of nebulizers and MDIs and importance of monitoring MDI activations.   Pulmonary Rehab from  07/25/2017 in [facility] Cardiac and Pulmonary Rehab  Date  04/23/17  Educator  [facility]  Instruction Review Code  1- Verbalizes Understanding      General Nutrition Guidelines/Fats and Fiber: -Group instruction provided by verbal, written material, models and posters to present the general guidelines for heart healthy nutrition. Gives an explanation and review of dietary fats and fiber.   Controlling Sodium/Reading Food Labels: -Group verbal and written material supporting the discussion of sodium use in heart healthy nutrition. Review and explanation with models, verbal and written materials for utilization of the food label.   Pulmonary Rehab from 07/25/2017 in [facility] Cardiac and Pulmonary Rehab  Date  04/30/17  Educator  [facility]  Instruction Review Code  1- Verbalizes Understanding      Exercise Physiology & General Exercise Guidelines: - Group verbal and written instruction with models to review the exercise physiology of the cardiovascular system and associated critical values. Provides general exercise guidelines with specific guidelines to those with heart or lung disease.    Pulmonary Rehab from 07/25/2017 in [facility] Cardiac and Pulmonary Rehab  Date  07/04/17  Educator  [facility]  Instruction Review Code  5- Refused Teaching      Aerobic Exercise & Resistance Training: - Gives group verbal and written instruction on the various components of exercise. Focuses on aerobic and resistive training programs and the benefits of this training and how to safely progress through these programs.   Pulmonary Rehab from 07/25/2017 in [facility] Cardiac and Pulmonary Rehab  Date  07/20/17  Educator  [facility]  Instruction Review Code  1- Verbalizes Understanding      Flexibility, Balance, Mind/Body Relaxation: Provides group verbal/written instruction on the benefits of flexibility and balance training, including mind/body exercise modes such as yoga, pilates and tai chi.  Demonstration and skill practice provided.   Pulmonary  Rehab from 07/25/2017 in [facility] Cardiac and Pulmonary Rehab  Date  05/09/17  Educator  [facility]  Instruction Review Code  1- Verbalizes Understanding      Stress and Anxiety: - Provides group verbal and written instruction about the health risks of elevated stress and causes of high stress.  Discuss the correlation between heart/lung disease and anxiety and treatment options. Review healthy ways to manage with stress and anxiety.   Pulmonary Rehab from 07/25/2017 in [facility] Cardiac and Pulmonary Rehab  Date  05/30/17  Educator  [facility]  Instruction Review Code  1- Verbalizes Understanding      Depression: - Provides group verbal and written instruction on the correlation between heart/lung disease and depressed mood, treatment options, and the stigmas associated with seeking treatment.   Pulmonary Rehab from 07/25/2017 in [facility] Cardiac and Pulmonary Rehab  Date  05/16/17  Educator  [facility]  Instruction Review Code  1- Verbalizes Understanding      Exercise & Equipment Safety: - Individual verbal instruction and demonstration of equipment use and safety with use of the equipment.   Pulmonary Rehab from 07/25/2017 in [facility] Cardiac and Pulmonary Rehab  Date  04/23/17  Educator  [facility]  Instruction Review Code  1- Verbalizes Understanding      Infection Prevention: - Provides verbal and written material to individual with discussion of infection control including proper hand washing and proper equipment cleaning during exercise session.   Pulmonary Rehab from 07/25/2017 in [facility] Cardiac and Pulmonary Rehab  Date  04/23/17  Educator  [place]  Instruction Review Code  1- Verbalizes Understanding      Falls Prevention: - Provides verbal and written material to individual with discussion of falls prevention and safety.   Pulmonary Rehab from 07/25/2017 in [facility] Cardiac and Pulmonary Rehab  Date  04/23/17  Educator  [facility]  Instruction Review Code  1- Verbalizes Understanding      Diabetes: - Individual verbal and written  instruction to review signs/symptoms of diabetes, desired ranges of glucose level fasting, after meals and with exercise. Advice that pre and post exercise glucose checks will be done for 3 sessions at entry of program.   Chronic Lung Diseases: - Group verbal and written instruction to review updates, respiratory medications, advancements in procedures and treatments. Discuss use of supplemental oxygen including available portable oxygen systems, continuous and intermittent flow rates, concentrators, personal use and safety guidelines. Review proper use of inhaler and spacers. Provide informative websites for self-education.    Energy Conservation: - Provide group verbal and written instruction for methods to conserve energy, plan and organize activities. Instruct on pacing techniques, use of adaptive equipment and posture/positioning to relieve shortness of breath.   Pulmonary Rehab from 07/25/2017 in [facility] Cardiac and Pulmonary Rehab  Date  05/23/17  Educator  [facility]  Instruction Review Code  1- Verbalizes Understanding      Triggers and Exacerbations: - Group verbal and written instruction to review types of environmental triggers and ways to prevent exacerbations. Discuss weather changes, air quality and the benefits of nasal washing. Review warning signs and symptoms to help prevent infections. Discuss techniques for effective airway clearance, coughing, and vibrations.   Pulmonary Rehab from 07/25/2017 in [facility] Cardiac and Pulmonary Rehab  Date  07/18/17  Educator  [facility]  Instruction Review Code  1- Verbalizes Understanding      AED/CPR: - Group verbal and written instruction with the use of models to demonstrate the basic use of the AED with the basic ABC's of resuscitation.   Pulmonary Rehab from 07/25/2017 in [facility] Cardiac and Pulmonary Rehab  Date  05/25/17  Educator  [facility]  Instruction Review Code  1- Actuary and Physiology of the Lungs: - Group verbal and  written instruction with the use of models to provide basic lung anatomy and physiology related to function, structure and complications of lung disease.   Pulmonary Rehab from 07/25/2017 in [facility] Cardiac and Pulmonary Rehab  Date  06/06/17  Educator  [facility]  Instruction Review Code  1- Verbalizes Understanding      Anatomy & Physiology of the Heart: - Group verbal and written instruction and models provide basic cardiac anatomy and physiology, with the coronary electrical and arterial systems. Review of Valvular disease and Heart Failure   Pulmonary Rehab from 07/25/2017 in [facility] Cardiac and Pulmonary Rehab  Date  07/25/17  Educator  [facility]  Instruction Review Code  1- Verbalizes Understanding      Cardiac Medications: - Group verbal and written instruction to review commonly prescribed medications for heart disease. Reviews the medication, class of the drug, and side effects.   Pulmonary Rehab from 07/25/2017 in [facility] Cardiac and Pulmonary Rehab  Date  05/11/17  Educator  [facility]  Instruction Review Code  1- Verbalizes Understanding      Know Your Numbers and Risk Factors: -Group verbal and written instruction about important numbers in your health.  Discussion of what are risk factors and how they play a role in the disease  process.  Review of Cholesterol, Blood Pressure, Diabetes, and BMI and the role they play in your overall health.   Pulmonary Rehab from 07/25/2017 in [facility] Cardiac and Pulmonary Rehab  Date  07/06/17  Educator  [facility]  Instruction Review Code  1- Verbalizes Understanding      Sleep Hygiene: -Provides group verbal and written instruction about how sleep can affect your health.  Define sleep hygiene, discuss sleep cycles and impact of sleep habits. Review good sleep hygiene tips.    Pulmonary Rehab from 07/25/2017 in [facility] Cardiac and Pulmonary Rehab  Date  07/11/17  Educator  [facility]  Instruction Review Code  1- Verbalizes Understanding      Other: -Provides group and verbal  instruction on various topics (see comments)    Knowledge Questionnaire Score: Knowledge Questionnaire Score - 04/23/17 1427      Knowledge Questionnaire Score   Pre Score  14/18 reviewed with patient        Core Components/Risk Factors/Patient Goals at Admission: Personal Goals and Risk Factors at Admission - 04/23/17 1443      Core Components/Risk Factors/Patient Goals on Admission    Weight Management  Yes    Intervention  Weight Management: Develop a combined nutrition and exercise program designed to reach desired caloric intake, while maintaining appropriate intake of nutrient and fiber, sodium and fats, and appropriate energy expenditure required for the weight goal.;Weight Management/Obesity: Establish reasonable short term and long term weight goals.;Weight Management: Provide education and appropriate resources to help participant work on and attain dietary goals.    Admit Weight  105 lb (47.6 kg)    Goal Weight: Short Term  105 lb (47.6 kg)    Goal Weight: Long Term  105 lb (47.6 kg)    Expected Outcomes  Short Term: Continue to assess and modify interventions until short term weight is achieved;Long Term: Adherence to nutrition and physical activity/exercise program aimed toward attainment of established weight goal;Weight Maintenance: Understanding of the daily nutrition guidelines, which includes 25-35% calories from fat, 7% or less cal from saturated fats, less than '200mg'$  cholesterol, less than 1.5gm of sodium, & 5 or more servings of fruits and vegetables daily    Improve shortness of breath with ADL's  Yes    Intervention  Provide education, individualized exercise plan and daily activity instruction to help decrease symptoms of SOB with activities of daily living.    Expected Outcomes  Short Term: Improve cardiorespiratory fitness to achieve a reduction of symptoms when performing ADLs       Core Components/Risk Factors/Patient Goals Review:  Goals and Risk Factor  Review    Row Name 05/16/17 1026 07/11/17 1035           Core Components/Risk Factors/Patient Goals Review   Personal Goals Review  Weight Management/Obesity;Improve shortness of breath with ADL's  Weight Management/Obesity;Improve shortness of breath with ADL's      Review  Anne [name] has been doing well in [facility] and has been improving with her shortness of breath. Her blood pressure has been within normal limits. Her weight has been steady. She weighs 102 pounds. She is not trying to lose weight and watchers her weight closely.  Reaching and straining are some things that give her trouble breathing. Most of her household activities do not give her shortness of breath.  Anne [name] has been having trouble with her back pain lately. Her pain is waking her up in the middle of the night. She is interested in doing shots for pain rather  than surgery. Her weight has been maintained. She weighs 100 pounds right now but wants to be no less than that. Anne [name] shortness of breath has been ok but her strength has improved a lot.      Expected Outcomes  Short: watch weight closely.  Long: Maintain current weight   Short: watch weight closely.  Long: Maintain current weight          Core Components/Risk Factors/Patient Goals at Discharge (Final Review):  Goals and Risk Factor Review - 07/11/17 1035      Core Components/Risk Factors/Patient Goals Review   Personal Goals Review  Weight Management/Obesity;Improve shortness of breath with ADL's    Review  Anne [name] has been having trouble with her back pain lately. Her pain is waking her up in the middle of the night. She is interested in doing shots for pain rather than surgery. Her weight has been maintained. She weighs 100 pounds right now but wants to be no less than that. Anne [name] shortness of breath has been ok but her strength has improved a lot.    Expected Outcomes  Short: watch weight closely.  Long: Maintain current weight        ITP Comments: ITP  Comments    Row Name 04/23/17 1403 04/23/17 1445 05/07/17 0916 06/04/17 0825 06/18/17 0825   ITP Comments  Medical Evaluation completed. Chart sent for review and changes to Dr. Emily Filbert Director of [place]. Diagnosis can be found in [facility] encounter 04/23/17  patient is on Plavix to prevent stroke due to a hospital scare. She had slurred speech at one point but none of her test came back positive for a stroke.  30 day review completed. ITP sent to Dr. Emily Filbert Director of [place]. Continue with ITP unless changes are made by physician.  30 day review completed. ITP sent to Dr. Emily Filbert Director of [place]. Continue with ITP unless changes are made by physician.  Anne [name] called to say she will not be in [place] today due to a death in the family.   [place] Name 06/27/17 1022 07/02/17 0829 07/30/17 0826       ITP Comments  Anne [name] is back today after being out for awhile. She had a tooth pulled, an infection presented itself in her foot and she could not ambulate well. She is participating in [place] today and is doing good.   30 day review completed. ITP sent to Dr. Emily Filbert Director of [place]. Continue with ITP unless changes are made by physician   30 day review completed. ITP sent to Dr. Emily Filbert Director of [place]. Continue with ITP unless changes are made by physician        Comments: 30 day review

## 2017-07-30 NOTE — Progress Notes (Signed)
Daily Session Note  Patient Details  Name: Anne [name] MRN: [id number] Date of Birth: April 08, 1950 Referring Provider:     Pulmonary Rehab from 04/23/2017 in [facility]  Referring Provider  Merton Border MD      Encounter Date: 07/30/2017  Check In: Session Check In - 07/30/17 1017      Check-In   Location  [facility]-Cardiac & Pulmonary Rehab    Staff Present  Earlean Shawl, BS, ACSM CEP, Exercise Physiologist;Amanda Oletta Darter, BA, ACSM CEP, Exercise Physiologist;Ranyah Groeneveld Flavia Shipper    Supervising physician immediately available to respond to emergencies  [facility] immediately available ER MD    Physician(s)  Dr. Kerman Passey and Rifen[place]ark    Medication changes reported      No    Fall or balance concerns reported     No    Tobacco Cessation  No Change    Warm-up and Cool-down  Performed as group-led instruction    Resistance Training Performed  Yes    VAD Patient?  No      Pain Assessment   Currently in Pain?  No/denies          Social History   Tobacco Use  Smoking Status Never Smoker  Smokeless Tobacco Never Used    Goals Met:  Independence with exercise equipment Exercise tolerated well No report of cardiac concerns or symptoms Strength training completed today  Goals Unmet:  Not Applicable  Comments: Pt able to follow exercise prescription today without complaint.  Will continue to monitor for progression.   Dr. Emily Filbert is Medical Director for [place] and [facility].

## 2017-08-01 VITALS — Ht 63.2 in | Wt 100.0 lb

## 2017-08-01 DIAGNOSIS — J8410 Pulmonary fibrosis, unspecified: Secondary | ICD-10-CM | POA: Diagnosis not present

## 2017-08-01 NOTE — Progress Notes (Addendum)
Daily Session Note  Patient Details  Name: Anne [name] MRN: [id number] Date of Birth: 02/16/1951 Referring Provider:     Pulmonary Rehab from 04/23/2017 in [facility]  Referring Provider  Merton Border MD      Encounter Date: 08/01/2017  Check In: Session Check In - 08/01/17 1006      Check-In   Location  [facility]    Staff Present  Alberteen Sam, MA, RCEP, CCRP, Exercise Physiologist;Amanda Oletta Darter, BA, ACSM CEP, Exercise Physiologist;Joseph Flavia Shipper    Supervising physician immediately available to respond to emergencies  See telemetry face sheet for immediately available ER MD    Physician(s)  Jimmye Norman and Burlene Arnt    Medication changes reported      No    Fall or balance concerns reported     No    Warm-up and Cool-down  Performed as group-led instruction    Resistance Training Performed  Yes    VAD Patient?  No      Pain Assessment   Currently in Pain?  No/denies    Multiple Pain Sites  No          Social History   Tobacco Use  Smoking Status Never Smoker  Smokeless Tobacco Never Used    Goals Met:  Proper associated with RPD/PD & O2 Sat Independence with exercise equipment Exercise tolerated well Strength training completed today  Goals Unmet:  Not Applicable  Comments: Pt able to follow exercise prescription today without complaint.  Will continue to monitor for progression.  [place] Name 04/23/17 1535 08/01/17 1026       6 Minute Walk   Phase  Initial  Discharge    Distance  1380 feet  1677 feet    Distance % Change  -  21.6 %    Distance Feet Change  -  298 ft    Walk Time  6 minutes  6 minutes    # of Rest Breaks  0  0    MPH  2.61  3.17    METS  3.69  4.12    RPE  15  12    Perceived Dyspnea   3.5  1    VO2 Peak  12.9  14.43    Symptoms  Yes (comment)  Yes (comment)    Comments  wheezing with breathing and SOB  back pain 4/10    Resting HR  64 bpm  64 bpm    Resting BP   132/64  120/60    Resting Oxygen Saturation   98 %  100 %    Exercise Oxygen Saturation  during 6 min walk  95 %  91 %    Max Ex. HR  64 bpm  99 bpm    Max Ex. BP  138/64  128/74    2 Minute Post BP  126/60  112/60      Interval HR   1 Minute HR  96  68    2 Minute HR  102  -    3 Minute HR  103  -    4 Minute HR  99  67    5 Minute HR  104  69    6 Minute HR  102  99    2 Minute Post HR  75  74    Interval Heart Rate?  Yes  Yes      Interval Oxygen   Interval Oxygen?  Yes  Yes    Baseline Oxygen Saturation %  98 %  100 %    1 Minute Oxygen Saturation %  - equipment error  97 %    1 Minute Liters of Oxygen  0 L Room Air  0 L Room Air    2 Minute Oxygen Saturation %  98 %  -    2 Minute Liters of Oxygen  0 L  0 L    3 Minute Oxygen Saturation %  97 %  93 %    3 Minute Liters of Oxygen  0 L  0 L    4 Minute Oxygen Saturation %  95 %  91 %    4 Minute Liters of Oxygen  0 L  0 L    5 Minute Oxygen Saturation %  100 %  96 %    5 Minute Liters of Oxygen  0 L  0 L    6 Minute Oxygen Saturation %  97 %  96 %    6 Minute Liters of Oxygen  0 L  0 L    2 Minute Post Oxygen Saturation %  100 %  100 %    2 Minute Post Liters of Oxygen  0 L  0 L        Dr. Emily Filbert is Medical Director for [place] and [facility].

## 2017-08-03 ENCOUNTER — Encounter: Payer: Medicare Other | Admitting: *Deleted

## 2017-08-03 ENCOUNTER — Ambulatory Visit (INDEPENDENT_AMBULATORY_CARE_PROVIDER_SITE_OTHER): Payer: Medicare Other | Admitting: Family Medicine

## 2017-08-03 ENCOUNTER — Encounter: Payer: Self-pay | Admitting: Family Medicine

## 2017-08-03 ENCOUNTER — Other Ambulatory Visit: Payer: Self-pay

## 2017-08-03 VITALS — BP 98/56 | HR 77 | Temp 97.8°F | Wt 103.4 lb

## 2017-08-03 DIAGNOSIS — F419 Anxiety disorder, unspecified: Secondary | ICD-10-CM | POA: Diagnosis not present

## 2017-08-03 DIAGNOSIS — R002 Palpitations: Secondary | ICD-10-CM | POA: Insufficient documentation

## 2017-08-03 DIAGNOSIS — M47812 Spondylosis without myelopathy or radiculopathy, cervical region: Secondary | ICD-10-CM

## 2017-08-03 DIAGNOSIS — R600 Localized edema: Secondary | ICD-10-CM | POA: Insufficient documentation

## 2017-08-03 DIAGNOSIS — E538 Deficiency of other specified B group vitamins: Secondary | ICD-10-CM

## 2017-08-03 DIAGNOSIS — J8410 Pulmonary fibrosis, unspecified: Secondary | ICD-10-CM

## 2017-08-03 DIAGNOSIS — D649 Anemia, unspecified: Secondary | ICD-10-CM | POA: Diagnosis not present

## 2017-08-03 LAB — TSH: TSH: 0.44 u[IU]/mL (ref 0.35–4.50)

## 2017-08-03 LAB — CBC
HCT: 30.3 % — ABNORMAL LOW (ref 36.0–46.0)
Hemoglobin: 10.2 g/dL — ABNORMAL LOW (ref 12.0–15.0)
MCHC: 33.6 g/dL (ref 30.0–36.0)
MCV: 87.2 fl (ref 78.0–100.0)
Platelets: 208 10*3/uL (ref 150.0–400.0)
RBC: 3.47 Mil/uL — ABNORMAL LOW (ref 3.87–5.11)
RDW: 15 % (ref 11.5–15.5)
WBC: 4 10*3/uL (ref 4.0–10.5)

## 2017-08-03 MED ORDER — CYANOCOBALAMIN 1000 MCG/ML IJ SOLN
1000.0000 ug | Freq: Once | INTRAMUSCULAR | Status: AC
  Administered 2017-08-03: 1000 ug via INTRAMUSCULAR

## 2017-08-03 NOTE — Assessment & Plan Note (Signed)
Controlled.  Taper down on Xanax as outlined in AVS.

## 2017-08-03 NOTE — Assessment & Plan Note (Signed)
Needs recheck today.

## 2017-08-03 NOTE — Assessment & Plan Note (Signed)
She will continue to follow with neurosurgery for her spinal issues.

## 2017-08-03 NOTE — Assessment & Plan Note (Signed)
Asymptomatic currently.  BNP previously was normal.  I did doubt this is CHF related.  We will check her hemoglobin again as it could be anemia related.  Check thyroid function.  Could be venous insufficiency.  She will continue to monitor.

## 2017-08-03 NOTE — Progress Notes (Signed)
  Tommi Rumps, MD Phone: [phone]  Anne [name] is a 67 y.o. female who presents today for f/u.  Anxiety: She notes this is fine.  She takes half a Xanax twice daily.  She gets no benefit from this.  She is okay coming off of this.  She reports many years worth of heart fluttering.  This occurs a couple times a day.  She notes no chest pain or shortness of breath.  This was occurring well before her most recent EKG.  No prior Holter monitor.  She had some lower extremity edema for which she saw an FNP at 1 of our other offices.  Notes it started after a dental procedure.  It has gone away though occasionally comes back slightly.  No orthopnea.  Does occasionally wake up short of breath at night though she does have lung issues.  No chest pain or shortness of breath.  She was found to be anemic.  She is following with neurosurgery for her neck.  She had neck surgery last year.  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  She is on tizanidine and Mobic.  Social History   Tobacco Use  Smoking Status Never Smoker  Smokeless Tobacco Never Used     ROS see history of present illness  Objective  Physical Exam Vitals:   08/03/17 1321  BP: (!) 98/56  Pulse: 77  Temp: 97.8 F (36.6 C)  SpO2: 99%    BP Readings from Last 3 Encounters:  08/03/17 (!) 98/56  06/22/17 118/66  05/18/17 110/60   Wt Readings from Last 3 Encounters:  08/03/17 103 lb 6.4 oz (46.9 kg)  08/01/17 100 lb (45.4 kg)  06/22/17 105 lb 8 oz (47.9 kg)    Physical Exam  Constitutional: No distress.  Cardiovascular: Normal rate, regular rhythm and normal heart sounds.  2+ DP pulses bilateral feet  Pulmonary/Chest: Effort normal and breath sounds normal.  Musculoskeletal: She exhibits no edema.  Neurological: She is alert.  Skin: Skin is warm and dry. She is not diaphoretic.     Assessment/Plan: Please see individual  problem list.  Degenerative arthritis of cervical spine She will continue to follow with neurosurgery for her spinal issues.  Anemia Needs recheck today.  Bilateral leg edema Asymptomatic currently.  BNP previously was normal.  I did doubt this is CHF related.  We will check her hemoglobin again as it could be anemia related.  Check thyroid function.  Could be venous insufficiency.  She will continue to monitor.  Palpitations Chronic issues for many years with this.  No prior history of Holter monitor.  Referring back to cardiology for consideration of this.  Anxiety Controlled.  Taper down on Xanax as outlined in AVS.   Orders Placed This Encounter  Procedures  . CBC  . TSH  . Ambulatory referral to Cardiology    Referral Priority:   Routine    Referral Type:   Consultation    Referral Reason:   Specialty Services Required    Requested Specialty:   Cardiology    Number of Visits Requested:   1    Meds ordered this encounter  Medications  . cyanocobalamin ((VITAMIN B-12)) injection 1,000 mcg     Tommi Rumps, MD [place]

## 2017-08-03 NOTE — Patient Instructions (Signed)
Nice to see you. Please taper down on the Xanax.  You will take half a tablet once daily for 7 days, then take half a tablet every other day for 7 days then discontinue. We will get you back in to see cardiology for follow-up. Please monitor your swelling and if it recurs please let us know. We will check lab work today and contact you with the results.

## 2017-08-03 NOTE — Assessment & Plan Note (Signed)
Chronic issues for many years with this.  No prior history of Holter monitor.  Referring back to cardiology for consideration of this.

## 2017-08-03 NOTE — Patient Instructions (Signed)
Discharge Patient Instructions  Patient Details  Name: Anne [name] MRN: [id number] Date of Birth: 08-12-1950 Referring Provider:  Wilhelmina Mcardle, MD   Number of Visits: 36/36  Reason for Discharge:  Patient reached a stable level of exercise. Patient independent in their exercise. Patient has met program and personal goals.  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[place])  Initial Exercise Prescription: Initial Exercise Prescription - 04/23/17 1500      Date of Initial Exercise RX and Referring Provider   Date  04/23/17    Referring Provider  Merton Border MD      Treadmill   MPH  2.6    Grade  1    Minutes  15    METs  3.35      NuStep   Level  2    SPM  80    Minutes  15    METs  3      REL-XR   Level  1    Speed  50    Minutes  15    METs  3      Prescription Details   Frequency (times per week)  3    Duration  Progress to 45 minutes of aerobic exercise without signs/symptoms of physical distress      Intensity   THRR 40-80% of Max Heartrate  100-135    Ratings of Perceived Exertion  11-13    Perceived Dyspnea  0-4      Progression   Progression  Continue to progress workloads to maintain intensity without signs/symptoms of physical distress.      Resistance Training   Training Prescription  Yes    Weight  3 lbs    Reps  10-15       Discharge Exercise Prescription (Final Exercise Prescription Changes): Exercise Prescription Changes - 08/01/17 1400      Response to Exercise   Blood Pressure (Admit)  120/60    Blood Pressure (Exit)  116/64    Heart Rate (Admit)  64 bpm    Heart Rate (Exercise)  91 bpm    Heart Rate (Exit)  87 bpm    Oxygen Saturation (Admit)  100 %    Oxygen Saturation (Exercise)  99 %    Oxygen Saturation (Exit)  92 %    Rating of Perceived Exertion (Exercise)  12    Perceived Dyspnea (Exercise)   1    Duration  Continue with 45 min of aerobic exercise without signs/symptoms of physical distress.    Intensity  THRR unchanged      Progression   Progression  Continue to progress workloads to maintain intensity without signs/symptoms of physical distress.    Average METs  4.12 from 6 min walk      Resistance Training   Training Prescription  Yes    Weight  3 lb    Reps  10-15      Interval Training   Interval Training  No      NuStep   Level  3    SPM  120    Minutes  15    METs  2.4      Home Exercise Plan   Plans to continue exercise at  [employer] (comment) [place] and walk    Frequency  Add 2 additional days to program exercise sessions.    Initial Home Exercises  Provided  05/02/17       Functional Capacity: 6 Minute Walk    Row Name 04/23/17 1535 08/01/17 1026       6 Minute Walk   Phase  Initial  Discharge    Distance  1380 feet  1677 feet    Distance % Change  -  21.6 %    Distance Feet Change  -  298 ft    Walk Time  6 minutes  6 minutes    # of Rest Breaks  0  0    MPH  2.61  3.17    METS  3.69  4.12    RPE  15  12    Perceived Dyspnea   3.5  1    VO2 Peak  12.9  14.43    Symptoms  Yes (comment)  Yes (comment)    Comments  wheezing with breathing and SOB  back pain 4/10    Resting HR  64 bpm  64 bpm    Resting BP  132/64  120/60    Resting Oxygen Saturation   98 %  100 %    Exercise Oxygen Saturation  during 6 min walk  95 %  91 %    Max Ex. HR  64 bpm  99 bpm    Max Ex. BP  138/64  128/74    2 Minute Post BP  126/60  112/60      Interval HR   1 Minute HR  96  68    2 Minute HR  102  -    3 Minute HR  103  -    4 Minute HR  99  67    5 Minute HR  104  69    6 Minute HR  102  99    2 Minute Post HR  75  74    Interval Heart Rate?  Yes  Yes      Interval Oxygen   Interval Oxygen?  Yes  Yes    Baseline Oxygen Saturation %  98 %  100 %    1 Minute Oxygen Saturation %  - equipment error  97 %    1 Minute Liters of Oxygen  0 L  Room Air  0 L Room Air    2 Minute Oxygen Saturation %  98 %  -    2 Minute Liters of Oxygen  0 L  0 L    3 Minute Oxygen Saturation %  97 %  93 %    3 Minute Liters of Oxygen  0 L  0 L    4 Minute Oxygen Saturation %  95 %  91 %    4 Minute Liters of Oxygen  0 L  0 L    5 Minute Oxygen Saturation %  100 %  96 %    5 Minute Liters of Oxygen  0 L  0 L    6 Minute Oxygen Saturation %  97 %  96 %    6 Minute Liters of Oxygen  0 L  0 L    2 Minute Post Oxygen Saturation %  100 %  100 %    2 Minute Post Liters of Oxygen  0 L  0 L       Quality of Life:   Personal Goals: Goals established at orientation with interventions provided to work toward goal. Personal Goals and Risk Factors at Admission - 04/23/17 1443  Core Components/Risk Factors/Patient Goals on Admission    Weight Management  Yes    Intervention  Weight Management: Develop a combined nutrition and exercise program designed to reach desired caloric intake, while maintaining appropriate intake of nutrient and fiber, sodium and fats, and appropriate energy expenditure required for the weight goal.;Weight Management/Obesity: Establish reasonable short term and long term weight goals.;Weight Management: Provide education and appropriate resources to help participant work on and attain dietary goals.    Admit Weight  105 lb (47.6 kg)    Goal Weight: Short Term  105 lb (47.6 kg)    Goal Weight: Long Term  105 lb (47.6 kg)    Expected Outcomes  Short Term: Continue to assess and modify interventions until short term weight is achieved;Long Term: Adherence to nutrition and physical activity/exercise program aimed toward attainment of established weight goal;Weight Maintenance: Understanding of the daily nutrition guidelines, which includes 25-35% calories from fat, 7% or less cal from saturated fats, less than '200mg'$  cholesterol, less than 1.5gm of sodium, & 5 or more servings of fruits and vegetables daily    Improve shortness of  breath with ADL's  Yes    Intervention  Provide education, individualized exercise plan and daily activity instruction to help decrease symptoms of SOB with activities of daily living.    Expected Outcomes  Short Term: Improve cardiorespiratory fitness to achieve a reduction of symptoms when performing ADLs        Personal Goals Discharge: Goals and Risk Factor Review - 07/11/17 1035      Core Components/Risk Factors/Patient Goals Review   Personal Goals Review  Weight Management/Obesity;Improve shortness of breath with ADL's    Review  Deltha has been having trouble with her back pain lately. Her pain is waking her up in the middle of the night. She is interested in doing shots for pain rather than surgery. Her weight has been maintained. She weighs 100 pounds right now but wants to be no less than that. Leimomi shortness of breath has been ok but her strength has improved a lot.    Expected Outcomes  Short: watch weight closely.  Long: Maintain current weight        Exercise Goals and Review: Exercise Goals    Row Name 04/23/17 1542             Exercise Goals   Increase Physical Activity  Yes       Intervention  Provide advice, education, support and counseling about physical activity/exercise needs.;Develop an individualized exercise prescription for aerobic and resistive training based on initial evaluation findings, risk stratification, comorbidities and participant's personal goals.       Expected Outcomes  Short Term: Attend rehab on a regular basis to increase amount of physical activity.;Long Term: Add in home exercise to make exercise part of routine and to increase amount of physical activity.;Long Term: Exercising regularly at least 3-5 days a week.       Increase Strength and Stamina  Yes       Intervention  Provide advice, education, support and counseling about physical activity/exercise needs.;Develop an individualized exercise prescription for aerobic and resistive  training based on initial evaluation findings, risk stratification, comorbidities and participant's personal goals.       Expected Outcomes  Short Term: Increase workloads from initial exercise prescription for resistance, speed, and METs.;Short Term: Perform resistance training exercises routinely during rehab and add in resistance training at home;Long Term: Improve cardiorespiratory fitness, muscular endurance and strength as measured by  increased METs and functional capacity (6MWT)       Able to understand and use rate of perceived exertion (RPE) scale  Yes       Intervention  Provide education and explanation on how to use RPE scale       Expected Outcomes  Short Term: Able to use RPE daily in rehab to express subjective intensity level;Long Term:  Able to use RPE to guide intensity level when exercising independently       Able to understand and use Dyspnea scale  Yes       Intervention  Provide education and explanation on how to use Dyspnea scale       Expected Outcomes  Short Term: Able to use Dyspnea scale daily in rehab to express subjective sense of shortness of breath during exertion;Long Term: Able to use Dyspnea scale to guide intensity level when exercising independently       Knowledge and understanding of Target Heart Rate Range (THRR)  Yes       Intervention  Provide education and explanation of THRR including how the numbers were predicted and where they are located for reference       Expected Outcomes  Short Term: Able to state/look up THRR;Short Term: Able to use daily as guideline for intensity in rehab;Long Term: Able to use THRR to govern intensity when exercising independently       Able to check pulse independently  Yes       Intervention  Provide education and demonstration on how to check pulse in carotid and radial arteries.;Review the importance of being able to check your own pulse for safety during independent exercise       Expected Outcomes  Short Term: Able to  explain why pulse checking is important during independent exercise;Long Term: Able to check pulse independently and accurately       Understanding of Exercise Prescription  Yes       Intervention  Provide education, explanation, and written materials on patient's individual exercise prescription       Expected Outcomes  Short Term: Able to explain program exercise prescription;Long Term: Able to explain home exercise prescription to exercise independently          Nutrition & Weight - Outcomes: Pre Biometrics - 04/23/17 1543      Pre Biometrics   Height  5' 3.2" (1.605 m)    Weight  105 lb 1.6 oz (47.7 kg)    Waist Circumference  24 inches    Hip Circumference  34.5 inches    Waist to Hip Ratio  0.7 %    BMI (Calculated)  18.51      Post Biometrics - 08/01/17 1028       Post  Biometrics   Height  5' 3.2" (1.605 m)    Weight  100 lb (45.4 kg)    Waist Circumference  23 inches    Hip Circumference  34 inches    Waist to Hip Ratio  0.68 %    BMI (Calculated)  17.61       Nutrition: Nutrition Therapy & Goals - 04/23/17 1420      Personal Nutrition Goals   Comments  She cannot eat alot at one time. Some things are hard for her to swallow. She has learned how to eat over the years for her to be comfortable.      Intervention Plan   Intervention  Prescribe, educate and counsel regarding individualized specific dietary modifications aiming towards targeted  core components such as weight, hypertension, lipid management, diabetes, heart failure and other comorbidities.;Nutrition handout(s) given to patient.    Expected Outcomes  Short Term Goal: Understand basic principles of dietary content, such as calories, fat, sodium, cholesterol and nutrients.;Long Term Goal: Adherence to prescribed nutrition plan.       Nutrition Discharge: Nutrition Assessments - 04/23/17 1441      MEDFICTS Scores   Pre Score  28       Education Questionnaire Score: Knowledge Questionnaire Score -  08/01/17 1015      Knowledge Questionnaire Score   Pre Score  14/18    Post Score  15/18 reviewed with patient       Goals reviewed with patient; copy given to patient.

## 2017-08-03 NOTE — Progress Notes (Signed)
Daily Session Note  Patient Details  Name: Anne [name] MRN: [id number] Date of Birth: 08/13/1950 Referring Provider:     Pulmonary Rehab from 04/23/2017 in [facility]  Referring Provider  Merton Border MD      Encounter Date: 08/03/2017  Check In: Session Check In - 08/03/17 1018      Check-In   Location  [facility]-Cardiac & Pulmonary Rehab    Staff Present  Renita Papa, RN Vickki Hearing, BA, ACSM CEP, Exercise Physiologist;Other Constance Goltz RN    Supervising physician immediately available to respond to emergencies  [facility] immediately available ER MD    Physician(s)  Dr. Clearnce Hasten and Alfred Levins    Medication changes reported      No    Fall or balance concerns reported     No    Tobacco Cessation  No Change    Warm-up and Cool-down  Performed as group-led instruction    Resistance Training Performed  Yes    VAD Patient?  No      Pain Assessment   Currently in Pain?  No/denies          Social History   Tobacco Use  Smoking Status Never Smoker  Smokeless Tobacco Never Used    Goals Met:  Proper associated with RPD/PD & O2 Sat Independence with exercise equipment Using PLB without cueing & demonstrates good technique Exercise tolerated well No report of cardiac concerns or symptoms Strength training completed today  Goals Unmet:  Not Applicable  Comments: Pt able to follow exercise prescription today without complaint.  Will continue to monitor for progression.    Dr. Emily Filbert is Medical Director for [facility] and [facility].

## 2017-08-06 ENCOUNTER — Encounter: Payer: Medicare Other | Admitting: *Deleted

## 2017-08-06 DIAGNOSIS — J8410 Pulmonary fibrosis, unspecified: Secondary | ICD-10-CM

## 2017-08-06 NOTE — Progress Notes (Signed)
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  acetaminophen (TYLENOL) 500 MG tablet, Take 500 mg by mouth daily as needed for moderate pain or headache., Disp: , Rfl:  .  albuterol (PROVENTIL HFA;VENTOLIN HFA) 108 (90 Base) MCG/ACT inhaler, Inhale 2 puffs into the lungs every 6 (six) hours as needed for wheezing or shortness of breath., Disp: 1 Inhaler, Rfl: 5 .  ALPRAZolam (XANAX) 0.25 MG tablet, TAKE ONE-HALF TABLET BY MOUTH TWICE DAILY AS NEEDED, Disp: 30 tablet, Rfl: 0 .  cetirizine (ZYRTEC) 10 MG tablet, TAKE ONE TABLET EVERY DAY, Disp: 30 tablet, Rfl: 5 .  clopidogrel (PLAVIX) 75 MG tablet, TAKE ONE TABLET BY MOUTH EVERY MORNING, Disp: 90 tablet, Rfl: 1 .  EPINEPHrine (EPIPEN 2-PAK) 0.3 mg/0.3 mL IJ SOAJ injection, Inject 0.3 mLs (0.3 mg total) into the muscle once. (Patient taking differently: Inject 0.3 mg once as needed into the muscle (FOR AN ALLERGIC REACTION). ), Disp: 1 Device, Rfl: 0 .  estradiol (ESTRACE) 1 MG tablet, TAKE ONE TABLET BY MOUTH EVERY DAY, Disp: 90 tablet, Rfl: 1 .  fluticasone (FLONASE) 50 MCG/ACT nasal spray, PLACE 2 SPRAYS INTO BOTH NOSTRILS DAILY. (Patient taking differently: Place 2 sprays into both nostrils daily as needed for allergies. ), Disp: 16 g, Rfl: 2 .  furosemide (LASIX) 20 MG tablet, TAKE ONE TABLET EVERY DAY AS NEEDED, Disp: 90 tablet, Rfl: 1 .  hydroxypropyl methylcellulose (ISOPTO TEARS) 2.5 % ophthalmic solution, Place 1 drop into both eyes 3  (three) times daily as needed for dry eyes., Disp: , Rfl:  .  ipratropium-albuterol (DUONEB) 0.5-2.5 (3) MG/3ML SOLN, Take 3 mLs by nebulization every 4 (four) hours as needed. Dx 496, Disp: 120 mL, Rfl: 2 .  meloxicam (MOBIC) 15 MG tablet, Take 15 mg by mouth daily., Disp: , Rfl:  .  montelukast (SINGULAIR) 10 MG tablet, TAKE ONE TABLET BY MOUTH AT BEDTIME, Disp: 90 tablet, Rfl: 1 .  mupirocin ointment (BACTROBAN) 2 %, Apply 1 application daily as needed topically (FOR [facility] SITE IRRITATION). , Disp: , Rfl:  .  pantoprazole (PROTONIX) 40 MG tablet, Take 1 tablet (40 mg total) by mouth daily., Disp: 90 tablet, Rfl: 0 .  potassium chloride 20 MEQ/15ML (10%) SOLN, Take 15 mLs (20 mEq total) by mouth daily as needed (cramps). (Patient taking differently: Take 20 mEq daily as needed by mouth (for cramping). ), Disp: 240 mL, Rfl: 0 .  Probiotic Product (PROBIOTIC PO), Take 1 capsule daily as needed by mouth (FOR G.I. DISTRESS). , Disp: , Rfl:  .  sodium chloride (OCEAN) 0.65 % SOLN nasal spray, Place 1 spray into both nostrils every 4 (four) hours as needed for congestion. , Disp: , Rfl:  .  tiZANidine (ZANAFLEX) 2 MG tablet, Take 2 mg by mouth every 6 (six) hours as needed for muscle spasms., Disp: , Rfl:   Current Facility-Administered Medications:  .  ipratropium-albuterol (DUONEB) 0.5-2.5 (3) MG/3ML nebulizer solution 3 mL, 3 mL, Nebulization, Q6H, Arnett, Yvetta Coder,  FNP, 3 mL at 05/18/17 1437  Past Medical History: Past Medical History:  Diagnosis Date  . Allergic rhinitis   . Anemia   . Asthma   . Complication of anesthesia    Breathing problems upon waking up. 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  . Compressed cervical disc   . COPD (chronic obstructive pulmonary disease) ([place])   . CVA (cerebral infarction)   . Dyspnea   . Esophageal dysmotility   . Heart murmur    as child  . History of hiatal hernia   . IBS (irritable bowel syndrome)   . Migraine   .  PICC (peripherally inserted central catheter) removal 02/20/2017  . PONV (postoperative nausea and vomiting)   . Problems with swallowing    intermittently  . Pulmonary fibrosis ([place])   . Shingles   . Stroke [facility])    slurred speech, drawn face, imaging normal, occurred twice, [facility]-"TIA" if antything" 02/20/17-no residual effects  . Tracheostomy in place [facility])   . Vocal cord paresis     Tobacco Use: Social History   Tobacco Use  Smoking Status Never Smoker  Smokeless Tobacco Never Used    Labs: Recent Review Flowsheet Data    Labs for ITP Cardiac and Pulmonary Rehab Latest Ref Rng & Units 12/13/2012 04/30/2013 12/13/2016   Cholestrol 0 - 200 mg/dL 201(H) - 215(H)   LDLCALC 0 - 99 mg/dL - - 64   LDLDIRECT mg/dL 92.7 - -   HDL >39.00 mg/dL 91.50 - 122.30   Trlycerides 0.0 - 149.0 mg/dL 101.0 - 140.0   Hemoglobin A1c 4.6 - 6.5 % - 5.9(H) 5.4       Pulmonary Assessment Scores: Pulmonary Assessment Scores    Row Name 04/23/17 1422 06/06/17 1016 08/01/17 1014     ADL UCSD   ADL Phase  Entry  Mid  Exit   SOB Score total  '68  20  23   '$ Rest  0  1  1   Walk  '2  3  1   '$ Stairs  '4  3  2   '$ Bath  0  0  0   Dress  0  0  0   Shop  1  0  1     CAT Score   CAT Score  14  -  15     mMRC Score   mMRC Score  2  -  -      Pulmonary Function Assessment: Pulmonary Function Assessment - 04/23/17 1426      Breath   Bilateral Breath Sounds  Clear;Decreased    Shortness of Breath  Yes more on long walks she gets short of breath       Exercise Target Goals:    Exercise Program Goal: Individual exercise prescription set using results from initial 6 min walk test and THRR while considering  patient's activity barriers and safety.    Exercise Prescription Goal: Initial exercise prescription builds to 30-45 minutes a day of aerobic activity, 2-3 days per week.  Home exercise guidelines will be given to patient during program as part of exercise prescription that the participant  will acknowledge.  Activity Barriers & Risk Stratification: Activity Barriers & Cardiac Risk Stratification - 04/23/17 1540      Activity Barriers & Cardiac Risk Stratification   Activity Barriers  Neck/Spine Problems;Deconditioning;Muscular Weakness;Shortness of Breath;Other (comment)    Comments  cervical spine fusions and hardwares (surgey x3), neuropathy in hands  6 Minute Walk: 6 Minute Walk    Row Name 04/23/17 1535 08/01/17 1026       6 Minute Walk   Phase  Initial  Discharge    Distance  1380 feet  1677 feet    Distance % Change  -  21.6 %    Distance Feet Change  -  298 ft    Walk Time  6 minutes  6 minutes    # of Rest Breaks  0  0    MPH  2.61  3.17    METS  3.69  4.12    RPE  15  12    Perceived Dyspnea   3.5  1    VO2 Peak  12.9  14.43    Symptoms  Yes (comment)  Yes (comment)    Comments  wheezing with breathing and SOB  back pain 4/10    Resting HR  64 bpm  64 bpm    Resting BP  132/64  120/60    Resting Oxygen Saturation   98 %  100 %    Exercise Oxygen Saturation  during 6 min walk  95 %  91 %    Max Ex. HR  64 bpm  99 bpm    Max Ex. BP  138/64  128/74    2 Minute Post BP  126/60  112/60      Interval HR   1 Minute HR  96  68    2 Minute HR  102  -    3 Minute HR  103  -    4 Minute HR  99  67    5 Minute HR  104  69    6 Minute HR  102  99    2 Minute Post HR  75  74    Interval Heart Rate?  Yes  Yes      Interval Oxygen   Interval Oxygen?  Yes  Yes    Baseline Oxygen Saturation %  98 %  100 %    1 Minute Oxygen Saturation %  - equipment error  97 %    1 Minute Liters of Oxygen  0 L Room Air  0 L Room Air    2 Minute Oxygen Saturation %  98 %  -    2 Minute Liters of Oxygen  0 L  0 L    3 Minute Oxygen Saturation %  97 %  93 %    3 Minute Liters of Oxygen  0 L  0 L    4 Minute Oxygen Saturation %  95 %  91 %    4 Minute Liters of Oxygen  0 L  0 L    5 Minute Oxygen Saturation %  100 %  96 %    5 Minute Liters of Oxygen  0 L  0 L    6  Minute Oxygen Saturation %  97 %  96 %    6 Minute Liters of Oxygen  0 L  0 L    2 Minute Post Oxygen Saturation %  100 %  100 %    2 Minute Post Liters of Oxygen  0 L  0 L      Oxygen Initial Assessment: Oxygen Initial Assessment - 04/23/17 1436      Home Oxygen   Home Oxygen Device  None    Sleep Oxygen Prescription  None    Home Exercise Oxygen Prescription  None  Home at Rest Exercise Oxygen Prescription  None      Initial 6 min Walk   Oxygen Used  None      Program Oxygen Prescription   Program Oxygen Prescription  None      Intervention   Short Term Goals  To learn and demonstrate proper pursed lip breathing techniques or other breathing techniques.;To learn and understand importance of monitoring SPO2 with pulse oximeter and demonstrate accurate use of the pulse oximeter.;To learn and demonstrate proper use of respiratory medications;To learn and understand importance of maintaining oxygen saturations>88% she uses her albuterol inhaler or nebulizer as needed.    Long  Term Goals  Verbalizes importance of monitoring SPO2 with pulse oximeter and return demonstration;Maintenance of O2 saturations>88%;Exhibits proper breathing techniques, such as pursed lip breathing or other method taught during program session;Compliance with respiratory medication;Demonstrates proper use of MDI's       Oxygen Re-Evaluation: Oxygen Re-Evaluation    Row Name 04/27/17 1014 05/16/17 1038 07/11/17 1039         Program Oxygen Prescription   Program Oxygen Prescription  -  None  None       Home Oxygen   Home Oxygen Device  -  None  None     Sleep Oxygen Prescription  -  None  None     Home Exercise Oxygen Prescription  -  None  None     Home at Rest Exercise Oxygen Prescription  -  None  None       Goals/Expected Outcomes   Short Term Goals  To learn and demonstrate proper pursed lip breathing techniques or other breathing techniques.;To learn and understand importance of monitoring SPO2  with pulse oximeter and demonstrate accurate use of the pulse oximeter.;To learn and demonstrate proper use of respiratory medications;To learn and understand importance of maintaining oxygen saturations>88%  To learn and demonstrate proper pursed lip breathing techniques or other breathing techniques.;To learn and understand importance of monitoring SPO2 with pulse oximeter and demonstrate accurate use of the pulse oximeter.;To learn and demonstrate proper use of respiratory medications;To learn and understand importance of maintaining oxygen saturations>88%  To learn and demonstrate proper pursed lip breathing techniques or other breathing techniques.;To learn and understand importance of monitoring SPO2 with pulse oximeter and demonstrate accurate use of the pulse oximeter.;To learn and demonstrate proper use of respiratory medications;To learn and understand importance of maintaining oxygen saturations>88%     Long  Term Goals  Verbalizes importance of monitoring SPO2 with pulse oximeter and return demonstration;Maintenance of O2 saturations>88%;Exhibits proper breathing techniques, such as pursed lip breathing or other method taught during program session;Compliance with respiratory medication;Demonstrates proper use of MDI's  Verbalizes importance of monitoring SPO2 with pulse oximeter and return demonstration;Maintenance of O2 saturations>88%;Exhibits proper breathing techniques, such as pursed lip breathing or other method taught during program session;Compliance with respiratory medication;Demonstrates proper use of MDI's  Verbalizes importance of monitoring SPO2 with pulse oximeter and return demonstration;Maintenance of O2 saturations>88%;Exhibits proper breathing techniques, such as pursed lip breathing or other method taught during program session;Compliance with respiratory medication;Demonstrates proper use of MDI's     Comments  Reviewed PLB technique with pt.  Talked about how it work and it's  important to maintaining his exercise saturations.    Sarron uses her rescue inhaler and nebulizer as needed. She does not have a pulse oximeter to check her oxygen at home. Informed patient where to get one and about how much they cost.  Patient verbalizes understanding.  Michalla has not obtained a pulse oximeter yet but plans to get one. Her shortness of breath has improved slightly but her strength has improved alot. She knows that her oxygen should be 88 percent and above. We talked about what to do if she gets short of breath and when to take a breathing treatment. She will take a breathing treatment when she feels like she is getting tight.     Goals/Expected Outcomes  Short: Become more profiecient at using PLB.   Long: Become independent at using PLB.  Short: obtain a pulse oximeter for home. Long: monitor oxygen at home independently.  Short: obtain a pulse oximeter. Long: check oxygen at home independently.        Oxygen Discharge (Final Oxygen Re-Evaluation): Oxygen Re-Evaluation - 07/11/17 1039      Program Oxygen Prescription   Program Oxygen Prescription  None      Home Oxygen   Home Oxygen Device  None    Sleep Oxygen Prescription  None    Home Exercise Oxygen Prescription  None    Home at Rest Exercise Oxygen Prescription  None      Goals/Expected Outcomes   Short Term Goals  To learn and demonstrate proper pursed lip breathing techniques or other breathing techniques.;To learn and understand importance of monitoring SPO2 with pulse oximeter and demonstrate accurate use of the pulse oximeter.;To learn and demonstrate proper use of respiratory medications;To learn and understand importance of maintaining oxygen saturations>88%    Long  Term Goals  Verbalizes importance of monitoring SPO2 with pulse oximeter and return demonstration;Maintenance of O2 saturations>88%;Exhibits proper breathing techniques, such as pursed lip breathing or other method taught during program  session;Compliance with respiratory medication;Demonstrates proper use of MDI's    Comments  Oria has not obtained a pulse oximeter yet but plans to get one. Her shortness of breath has improved slightly but her strength has improved alot. She knows that her oxygen should be 88 percent and above. We talked about what to do if she gets short of breath and when to take a breathing treatment. She will take a breathing treatment when she feels like she is getting tight.    Goals/Expected Outcomes  Short: obtain a pulse oximeter. Long: check oxygen at home independently.       Initial Exercise Prescription: Initial Exercise Prescription - 04/23/17 1500      Date of Initial Exercise RX and Referring Provider   Date  04/23/17    Referring Provider  Merton Border MD      Treadmill   MPH  2.6    Grade  1    Minutes  15    METs  3.35      NuStep   Level  2    SPM  80    Minutes  15    METs  3      REL-XR   Level  1    Speed  50    Minutes  15    METs  3      Prescription Details   Frequency (times per week)  3    Duration  Progress to 45 minutes of aerobic exercise without signs/symptoms of physical distress      Intensity   THRR 40-80% of Max Heartrate  100-135    Ratings of Perceived Exertion  11-13    Perceived Dyspnea  0-4      Progression   Progression  Continue to progress workloads to maintain intensity without signs/symptoms  of physical distress.      Resistance Training   Training Prescription  Yes    Weight  3 lbs    Reps  10-15       Perform Capillary Blood Glucose checks as needed.  Exercise Prescription Changes: Exercise Prescription Changes    Row Name 04/23/17 1500 05/02/17 1100 05/09/17 1200 05/09/17 1300 05/23/17 1500     Response to Exercise   Blood Pressure (Admit)  132/64  -  124/64  -  106/64   Blood Pressure (Exercise)  138/64  -  110/60  -  -   Blood Pressure (Exit)  126/60  -  96/58  -  104/64   Heart Rate (Admit)  64 bpm  -  81 bpm  -  87  bpm   Heart Rate (Exercise)  104 bpm  -  86 bpm  -  107 bpm   Heart Rate (Exit)  75 bpm  -  80 bpm  -  61 bpm   Oxygen Saturation (Admit)  98 %  -  94 %  -  96 %   Oxygen Saturation (Exercise)  95 %  -  93 %  -  90 %   Oxygen Saturation (Exit)  100 %  -  98 %  -  97 %   Rating of Perceived Exertion (Exercise)  15  -  14  -  13   Perceived Dyspnea (Exercise)  3.5  -  2  -  3   Symptoms  wheezing with breathing, SOB  -  -  -  -   Comments  walk test results  -  -  -  -   Duration  -  -  Continue with 45 min of aerobic exercise without signs/symptoms of physical distress.  -  Continue with 45 min of aerobic exercise without signs/symptoms of physical distress.   Intensity  -  -  THRR unchanged  -  THRR unchanged     Progression   Progression  -  -  Continue to progress workloads to maintain intensity without signs/symptoms of physical distress.  -  Continue to progress workloads to maintain intensity without signs/symptoms of physical distress.   Average METs  -  -  4.15  -  3.05     Resistance Training   Training Prescription  -  -  Yes  -  Yes   Weight  -  -  3 lb  -  3 lb   Reps  -  -  10-15  -  10-15     Interval Training   Interval Training  -  -  No  -  No     Treadmill   MPH  -  -  -  -  3   Grade  -  -  -  -  1   Minutes  -  -  -  -  15   METs  -  -  -  -  3.71     NuStep   Level  -  -  2  -  2   SPM  -  -  80  -  80   Minutes  -  -  15  -  15   METs  -  -  2.4  -  2.4     REL-XR   Level  -  -  1  -  -   Speed  -  -  50  -  -   Minutes  -  -  15  -  -   METs  -  -  5.2  -  -     Home Exercise Plan   Plans to continue exercise at  -  [employer] (comment) [place]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  -  Add 2 additional days to program exercise sessions.  Add 2 additional days to program exercise sessions.    Initial Home Exercises Provided  -  05/02/17  -  05/02/17  05/02/17   Row Name 06/06/17 1100 07/04/17 1200 07/19/17 1000 08/01/17 1400       Response to Exercise   Blood Pressure (Admit)  112/58  112/60  116/66  120/60    Blood Pressure (Exit)  116/62  98/58  115/82  116/64    Heart Rate (Admit)  87 bpm  86 bpm  75 bpm  64 bpm    Heart Rate (Exercise)  114 bpm  124 bpm  102 bpm  91 bpm    Heart Rate (Exit)  85 bpm  66 bpm  82 bpm  87 bpm    Oxygen Saturation (Admit)  94 %  99 %  97 %  100 %    Oxygen Saturation (Exercise)  99 %  96 %  93 %  99 %    Oxygen Saturation (Exit)  100 %  99 %  100 %  92 %    Rating of Perceived Exertion (Exercise)  '14  14  12  12    '$ Perceived Dyspnea (Exercise)  '3  3  1  1    '$ Duration  Continue with 45 min of aerobic exercise without signs/symptoms of physical distress.  Continue with 45 min of aerobic exercise without signs/symptoms of physical distress.  Continue with 45 min of aerobic exercise without signs/symptoms of physical distress.  Continue with 45 min of aerobic exercise without signs/symptoms of physical distress.    Intensity  THRR unchanged  THRR unchanged  THRR unchanged  THRR unchanged      Progression   Progression  Continue to progress workloads to maintain intensity without signs/symptoms of physical distress.  Continue to progress workloads to maintain intensity without signs/symptoms of physical distress.  Continue to progress workloads to maintain intensity without signs/symptoms of physical distress.  Continue to progress workloads to maintain intensity without signs/symptoms of physical distress.    Average METs  5.5  3.5  3.6  4.12 from 6 min walk      Resistance Training   Training Prescription  Yes  Yes  Yes  Yes    Weight  3 lb  3 lb  3 lb  3 lb    Reps  10-15  10-15  10-15  10-15      Interval Training   Interval Training  No  No  No  No      Treadmill   MPH  -  2.6  3  -    Grade  -  3  2  -    Minutes  -  15  15  -    METs  -   3.71  4.1  -      NuStep   Level  -  3  3  3  SPM  -  80  80  120    Minutes  -  '15  15  15    '$ METs  -  3  3.1  2.4      REL-XR   Level  3 leg press program intervals  -  -  -    Speed  60  -  -  -    Minutes  15  -  -  -    METs  8.1  -  -  -      Home Exercise Plan   Plans to continue exercise at  [employer] (comment) [place] and walk  [employer] (comment) Estate manager/land agent and walk  [employer] [facility] [employer] (comment) Estate manager/land agent and walk  [employer] (comment) [facility] and walk    Frequency  Add 2 additional days to program exercise sessions.  Add 2 additional days to program exercise sessions.  Add 2 additional days to program exercise sessions.  Add 2 additional days to program exercise sessions.    Initial Home Exercises Provided  05/02/17  05/02/17  05/02/17  05/02/17       Exercise Comments: Exercise Comments    Row Name 04/27/17 1013 08/06/17 1239         Exercise Comments  First full day of exercise!  Patient was oriented to gym and equipment including functions, settings, policies, and procedures.  Patient's individual exercise prescription and treatment plan were reviewed.  All starting workloads were established based on the results of the 6 minute walk test done at initial orientation visit.  The plan for exercise progression was also introduced and progression will be customized based on patient's performance and goals.  Amunique graduated today from  rehab with 36 sessions completed.  Details of the patient's exercise prescription and what She needs to do in order to continue the prescription and progress were discussed with patient.  Patient was given a copy of prescription and goals.  Patient verbalized understanding.  [facility]         Exercise Goals and Review: Exercise Goals    Row Name 04/23/17 1542             Exercise Goals   Increase Physical Activity  Yes       Intervention   Provide advice, education, support and counseling about physical activity/exercise needs.;Develop an individualized exercise prescription for aerobic and resistive training based on initial evaluation findings, risk stratification, comorbidities and participant's personal goals.       Expected Outcomes  Short Term: Attend rehab on a regular basis to increase amount of physical activity.;Long Term: Add in home exercise to make exercise part of routine and to increase amount of physical activity.;Long Term: Exercising regularly at least 3-5 days a week.       Increase Strength and Stamina  Yes       Intervention  Provide advice, education, support and counseling about physical activity/exercise needs.;Develop an individualized exercise prescription for aerobic and resistive training based on initial evaluation findings, risk stratification, comorbidities and participant's personal goals.       Expected Outcomes  Short Term: Increase workloads from initial exercise prescription for resistance, speed, and METs.;Short Term: Perform resistance training exercises routinely during rehab and add in resistance training at home;Long Term: Improve cardiorespiratory fitness, muscular endurance and strength as measured by increased METs and functional capacity (6MWT)       Able to understand  and use rate of perceived exertion (RPE) scale  Yes       Intervention  Provide education and explanation on how to use RPE scale       Expected Outcomes  Short Term: Able to use RPE daily in rehab to express subjective intensity level;Long Term:  Able to use RPE to guide intensity level when exercising independently       Able to understand and use Dyspnea scale  Yes       Intervention  Provide education and explanation on how to use Dyspnea scale       Expected Outcomes  Short Term: Able to use Dyspnea scale daily in rehab to express subjective sense of shortness of breath during exertion;Long Term: Able to use Dyspnea scale to  guide intensity level when exercising independently       Knowledge and understanding of Target Heart Rate Range (THRR)  Yes       Intervention  Provide education and explanation of THRR including how the numbers were predicted and where they are located for reference       Expected Outcomes  Short Term: Able to state/look up THRR;Short Term: Able to use daily as guideline for intensity in rehab;Long Term: Able to use THRR to govern intensity when exercising independently       Able to check pulse independently  Yes       Intervention  Provide education and demonstration on how to check pulse in carotid and radial arteries.;Review the importance of being able to check your own pulse for safety during independent exercise       Expected Outcomes  Short Term: Able to explain why pulse checking is important during independent exercise;Long Term: Able to check pulse independently and accurately       Understanding of Exercise Prescription  Yes       Intervention  Provide education, explanation, and written materials on patient's individual exercise prescription       Expected Outcomes  Short Term: Able to explain program exercise prescription;Long Term: Able to explain home exercise prescription to exercise independently          Exercise Goals Re-Evaluation : Exercise Goals Re-Evaluation    Row Name 04/27/17 1014 05/02/17 1111 05/09/17 1309 05/23/17 1505 06/06/17 1128     Exercise Goal Re-Evaluation   Exercise Goals Review  Understanding of Exercise Prescription;Able to understand and use Dyspnea scale;Knowledge and understanding of Target Heart Rate Range (THRR);Able to understand and use rate of perceived exertion (RPE) scale  Increase Physical Activity;Increase Strength and Stamina;Able to understand and use rate of perceived exertion (RPE) scale;Able to check pulse independently;Knowledge and understanding of Target Heart Rate Range (THRR);Able to understand and use Dyspnea scale  Increase  Physical Activity;Increase Strength and Stamina;Able to understand and use rate of perceived exertion (RPE) scale;Knowledge and understanding of Target Heart Rate Range (THRR)  Increase Physical Activity;Able to understand and use rate of perceived exertion (RPE) scale;Knowledge and understanding of Target Heart Rate Range (THRR);Increase Strength and Stamina;Able to understand and use Dyspnea scale  Increase Physical Activity;Increase Strength and Stamina;Able to understand and use rate of perceived exertion (RPE) scale;Knowledge and understanding of Target Heart Rate Range (THRR)   Comments  Reviewed RPE scale, THR and program prescription with pt today.  Pt voiced understanding and was given a copy of goals to take home.   Reviewed home exercise - Pt attends dancercise classses and walk.  She also has clearance from her Dr for weights no  more than 10-20 lb (neck surgery) She also walks at home  Pt is tolerating exercise well.  She has no restriction sother than her tolerance for strength training.     Deanette is progressing well with exercise.  Staff will continue to monitor progress.  Tessa added :15 intervals of higher resistance on the XR.  She enjoyed the challenge and will continue with this addition to her program.  She is reaching the target HR and RPE.   Expected Outcomes  Short: Use RPE daily to regulate intensity.  Long: Follow program prescription in THR.  Short - PT will continue dance class and walk on other day at home  Long - Pt will maintain exercise on her own  Short - Catia will continue to attend and particpate in her Dancercise class.  Long - Zhoey will improve upper body strength and cardiovascular endurance  Short - Davelyn wil continue to attend Long - Marialice will build overall MET level  Candor will continue with intervals and add them to NS as well Long - Pt will continue to improve MET levels   Row Name 07/04/17 1302 07/19/17 1055 08/01/17 1410         Exercise  Goal Re-Evaluation   Exercise Goals Review  Increase Physical Activity;Able to understand and use rate of perceived exertion (RPE) scale;Increase Strength and Stamina;Knowledge and understanding of Target Heart Rate Range (THRR);Able to understand and use Dyspnea scale  Increase Physical Activity;Able to understand and use rate of perceived exertion (RPE) scale;Knowledge and understanding of Target Heart Rate Range (THRR);Increase Strength and Stamina;Able to understand and use Dyspnea scale  Increase Physical Activity;Able to understand and use rate of perceived exertion (RPE) scale;Knowledge and understanding of Target Heart Rate Range (THRR);Understanding of Exercise Prescription;Increase Strength and Stamina;Able to understand and use Dyspnea scale;Able to check pulse independently     Comments  Hollin is progressing well even after missing almost 2 weeks.    Kimberlly continues to progress well and has improved overall MET level.  Shakeela improved overall METS on walk test and had 21% imporvement in distance.     Expected Outcomes  Short - Pt wil attend regularly Long - Pt will exercise independently after completing program  Short - PT will complete LW program Long - Pt will exercise indepedently  Short - Gloristine will complete LW  Long - Pt will mainain fitness on her own        Discharge Exercise Prescription (Final Exercise Prescription Changes): Exercise Prescription Changes - 08/01/17 1400      Response to Exercise   Blood Pressure (Admit)  120/60    Blood Pressure (Exit)  116/64    Heart Rate (Admit)  64 bpm    Heart Rate (Exercise)  91 bpm    Heart Rate (Exit)  87 bpm    Oxygen Saturation (Admit)  100 %    Oxygen Saturation (Exercise)  99 %    Oxygen Saturation (Exit)  92 %    Rating of Perceived Exertion (Exercise)  12    Perceived Dyspnea (Exercise)  1    Duration  Continue with 45 min of aerobic exercise without signs/symptoms of physical distress.    Intensity  THRR unchanged       Progression   Progression  Continue to progress workloads to maintain intensity without signs/symptoms of physical distress.    Average METs  4.12 from 6 min walk      Resistance Training   Training Prescription  Yes  Weight  3 lb    Reps  10-15      Interval Training   Interval Training  No      NuStep   Level  3    SPM  120    Minutes  15    METs  2.4      Home Exercise Plan   Plans to continue exercise at  [employer] (comment) [place] and walk    Frequency  Add 2 additional days to program exercise sessions.    Initial Home Exercises Provided  05/02/17       Nutrition:  Target Goals: Understanding of nutrition guidelines, daily intake of sodium '1500mg'$ , cholesterol '200mg'$ , calories 30% from fat and 7% or less from saturated fats, daily to have 5 or more servings of fruits and vegetables.  Biometrics: Pre Biometrics - 04/23/17 1543      Pre Biometrics   Height  5' 3.2" (1.605 m)    Weight  105 lb 1.6 oz (47.7 kg)    Waist Circumference  24 inches    Hip Circumference  34.5 inches    Waist to Hip Ratio  0.7 %    BMI (Calculated)  18.51      Post Biometrics - 08/01/17 1028       Post  Biometrics   Height  5' 3.2" (1.605 m)    Weight  100 lb (45.4 kg)    Waist Circumference  23 inches    Hip Circumference  34 inches    Waist to Hip Ratio  0.68 %    BMI (Calculated)  17.61       Nutrition Therapy Plan and Nutrition Goals: Nutrition Therapy & Goals - 04/23/17 1420      Personal Nutrition Goals   Comments  She cannot eat alot at one time. Some things are hard for her to swallow. She has learned how to eat over the years for her to be comfortable.      Intervention Plan   Intervention  Prescribe, educate and counsel regarding individualized specific dietary modifications aiming towards targeted core components such as weight, hypertension, lipid management, diabetes, heart failure and other comorbidities.;Nutrition handout(s) given to patient.     Expected Outcomes  Short Term Goal: Understand basic principles of dietary content, such as calories, fat, sodium, cholesterol and nutrients.;Long Term Goal: Adherence to prescribed nutrition plan.       Nutrition Assessments: Nutrition Assessments - 04/23/17 1441      MEDFICTS Scores   Pre Score  28       Nutrition Goals Re-Evaluation: Nutrition Goals Re-Evaluation    [place] Name 05/16/17 1043 07/11/17 1044           Goals   Current Weight  102 lb (46.3 kg)  100 lb (45.4 kg)      Nutrition Goal  Increase enegery, maintain or gain a couple pounds.  gain a little bit of weight. Maintain weight.      Comment  She has been put on protien, low carbs and low sugar diet. Her wound doctor has talked to her about her diet habits. It is hard for her to eat a good amount of food due to her trach and the muscles in her neck for swallowing.  She has met with several dieticians and she is not interseted in another dietician appointment.   Rheana states that she is busy and on the go and takes protien shakes and bars with her but does not always want to  eat them. It is hard for her to gain weight with her swallowing problems. She is going to work on eating more to gain a little more weight. She does not want to be under 100 pounds.      Expected Outcome  Short: increase food intake. Long: maintain weight or gain a few pounds.  Short: gain some weight. Long: maintain weight gain.         Nutrition Goals Discharge (Final Nutrition Goals Re-Evaluation): Nutrition Goals Re-Evaluation - 07/11/17 1044      Goals   Current Weight  100 lb (45.4 kg)    Nutrition Goal  gain a little bit of weight. Maintain weight.    Comment  Natsha states that she is busy and on the go and takes protien shakes and bars with her but does not always want to eat them. It is hard for her to gain weight with her swallowing problems. She is going to work on eating more to gain a little more weight. She does not want to be  under 100 pounds.    Expected Outcome  Short: gain some weight. Long: maintain weight gain.       Psychosocial: Target Goals: Acknowledge presence or absence of significant depression and/or stress, maximize coping skills, provide positive support system. Participant is able to verbalize types and ability to use techniques and skills needed for reducing stress and depression.   Initial Review & Psychosocial Screening: Initial Psych Review & Screening - 04/23/17 1418      Initial Review   Current issues with  None Identified      Family Dynamics   Good Support System?  Yes    Comments  She had a trach for 20 years and know what is normal with her breathing. Her husband and her 4 children are great for support.      Barriers   Psychosocial barriers to participate in program  There are no identifiable barriers or psychosocial needs.      Screening Interventions   Interventions  Encouraged to exercise;To provide support and resources with identified psychosocial needs;Provide feedback about the scores to participant    Expected Outcomes  Short Term goal: Utilizing psychosocial counselor, staff and physician to assist with identification of specific Stressors or current issues interfering with healing process. Setting desired goal for each stressor or current issue identified.;Long Term Goal: Stressors or current issues are controlled or eliminated.;Long Term goal: The participant improves quality of Life and PHQ9 Scores as seen by post scores and/or verbalization of changes;Short Term goal: Identification and review with participant of any Quality of Life or Depression concerns found by scoring the questionnaire.       Quality of Life Scores:  Scores of 19 and below usually indicate a poorer quality of life in these areas.  A difference of  2-3 points is a clinically meaningful difference.  A difference of 2-3 points in the total score of the Quality of Life Index has been associated with  significant improvement in overall quality of life, self-image, physical symptoms, and general health in studies assessing change in quality of life.  PHQ-9: Recent Review Flowsheet Data    Depression screen [facility] 2/9 08/01/2017 06/06/2017 04/23/2017 12/13/2016 10/28/2015   Decreased Interest 0 0 0 0 0   Down, Depressed, Hopeless 0 0 0 0 0   PHQ - 2 Score 0 0 0 0 0   Altered sleeping '2 1 3 '$ - -   Tired, decreased energy '1 1 1 '$ - -  Change in appetite 0 0 0 - -   Feeling bad or failure about yourself  0 0 0 - -   Trouble concentrating 0 0 0 - -   Moving slowly or fidgety/restless 0 0 0 - -   Suicidal thoughts 0 0 0 - -   PHQ-9 Score '3 2 4 '$ - -   Difficult doing work/chores - Not difficult at all Not difficult at all - -     Interpretation of Total Score  Total Score Depression Severity:  1-4 = Minimal depression, 5-9 = Mild depression, 10-14 = Moderate depression, 15-19 = Moderately severe depression, 20-27 = Severe depression   Psychosocial Evaluation and Intervention: Psychosocial Evaluation - 05/02/17 1142      Psychosocial Evaluation & Interventions   Interventions  Encouraged to exercise with the program and follow exercise prescription;Stress management education;Relaxation education    Comments  Counselor met with Ms. Brathwaite Gatha Mayer) today for initial psychosocial evaluation.  She is a 67 year old who has asthma and a Trach inserted in [place] (several months ago.  She has a strong support system with a spouse and adult children who live close by. Sary also is actively involved in her local church and volunteers at [employer].  She reports not sleeping well since the surgery and difficulty swallowing - which impact her appetite.  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  She states she is typically in a positive mood most of the time and has minimal stress other than her  health and a brother who is in a treatment facility for alcohol out of state.  Candida has goals to increase her stamina and strength while in this class and get back to consistently exercising.  Staff will follow.    Expected Outcomes  Emmalene will benefit from consistent exercise to achieve her stated goals.  The educational and psychoeducational components of this program will be helpful in understanding her condition and coping more positively.  Monitor sleep for improved health.    Continue Psychosocial Services   Follow up required by staff       Psychosocial Re-Evaluation: Psychosocial Re-Evaluation    [place] Name 05/16/17 1047 07/11/17 1049           Psychosocial Re-Evaluation   Current issues with  Current Sleep Concerns  Current Sleep Concerns      Comments  Her sleep has been difficult due to her neck hurting. She is talking to her doctor about her muscle spasms in her neck. She can turn her neck to the right but cannot turn her neck all the way to the left.  Bryonna sees her doctor on the 30th to discuss her pain in her back. She has been waking up in the middle of the night with pain in her back. She wants to get shots for therapy instead of surgery.       Expected Outcomes  Short: talk with her doctor about her sleep. Long: maintain good sleep independently  Short: meet with her doctor about her back pain. Long: Maintain pain pain management.      Interventions  Encouraged to attend Pulmonary Rehabilitation for the exercise  -      Continue Psychosocial Services   Follow up required by staff  Follow up required by staff         Psychosocial Discharge (Final Psychosocial Re-Evaluation): Psychosocial  Re-Evaluation - 07/11/17 1049      Psychosocial Re-Evaluation   Current issues with  Current Sleep Concerns    Comments  Guila sees her doctor on the 30th to discuss her pain in her back. She has been waking up in the middle of the night with pain in her back. She wants to get shots  for therapy instead of surgery.     Expected Outcomes  Short: meet with her doctor about her back pain. Long: Maintain pain pain management.    Continue Psychosocial Services   Follow up required by staff       Education: Education Goals: Education classes will be provided on a weekly basis, covering required topics. Participant will state understanding/return demonstration of topics presented.  Learning Barriers/Preferences: Learning Barriers/Preferences - 04/23/17 1431      Learning Barriers/Preferences   Learning Barriers  Sight wears glasses    Learning Preferences  None       Education Topics:  Initial Evaluation Education: - Verbal, written and demonstration of respiratory meds, oximetry and breathing techniques. Instruction on use of nebulizers and MDIs and importance of monitoring MDI activations.   Pulmonary Rehab from 08/03/2017 in [facility] Cardiac and Pulmonary Rehab  Date  04/23/17  Educator  [facility]  Instruction Review Code  1- Verbalizes Understanding      General Nutrition Guidelines/Fats and Fiber: -Group instruction provided by verbal, written material, models and posters to present the general guidelines for heart healthy nutrition. Gives an explanation and review of dietary fats and fiber.   Controlling Sodium/Reading Food Labels: -Group verbal and written material supporting the discussion of sodium use in heart healthy nutrition. Review and explanation with models, verbal and written materials for utilization of the food label.   Pulmonary Rehab from 08/03/2017 in [facility] Cardiac and Pulmonary Rehab  Date  04/30/17  Educator  [facility]  Instruction Review Code  1- Verbalizes Understanding      Exercise Physiology & General Exercise Guidelines: - Group verbal and written instruction with models to review the exercise physiology of the cardiovascular system and associated critical values. Provides general exercise guidelines with specific guidelines to those with heart or  lung disease.    Pulmonary Rehab from 08/03/2017 in [facility] Cardiac and Pulmonary Rehab  Date  07/04/17  Educator  [facility]  Instruction Review Code  5- Refused Teaching      Aerobic Exercise & Resistance Training: - Gives group verbal and written instruction on the various components of exercise. Focuses on aerobic and resistive training programs and the benefits of this training and how to safely progress through these programs.   Pulmonary Rehab from 08/03/2017 in [facility] Cardiac and Pulmonary Rehab  Date  07/20/17  Educator  [facility]  Instruction Review Code  1- Verbalizes Understanding      Flexibility, Balance, Mind/Body Relaxation: Provides group verbal/written instruction on the benefits of flexibility and balance training, including mind/body exercise modes such as yoga, pilates and tai chi.  Demonstration and skill practice provided.   Pulmonary Rehab from 08/03/2017 in [facility] Cardiac and Pulmonary Rehab  Date  08/01/17  Educator  [facility]  Instruction Review Code  1- Verbalizes Understanding      Stress and Anxiety: - Provides group verbal and written instruction about the health risks of elevated stress and causes of high stress.  Discuss the correlation between heart/lung disease and anxiety and treatment options. Review healthy ways to manage with stress and anxiety.   Pulmonary Rehab from 08/03/2017 in [facility] Cardiac and Pulmonary  Rehab  Date  05/30/17  Educator  [place]  Instruction Review Code  1- Verbalizes Understanding      Depression: - Provides group verbal and written instruction on the correlation between heart/lung disease and depressed mood, treatment options, and the stigmas associated with seeking treatment.   Pulmonary Rehab from 08/03/2017 in [facility] Cardiac and Pulmonary Rehab  Date  05/16/17  Educator  [facility]  Instruction Review Code  1- Verbalizes Understanding      Exercise & Equipment Safety: - Individual verbal instruction and demonstration of equipment use and safety with use of  the equipment.   Pulmonary Rehab from 08/03/2017 in [facility] Cardiac and Pulmonary Rehab  Date  04/23/17  Educator  [facility]  Instruction Review Code  1- Verbalizes Understanding      Infection Prevention: - Provides verbal and written material to individual with discussion of infection control including proper hand washing and proper equipment cleaning during exercise session.   Pulmonary Rehab from 08/03/2017 in [facility] Cardiac and Pulmonary Rehab  Date  04/23/17  Educator  [facility]  Instruction Review Code  1- Verbalizes Understanding      Falls Prevention: - Provides verbal and written material to individual with discussion of falls prevention and safety.   Pulmonary Rehab from 08/03/2017 in [facility] Cardiac and Pulmonary Rehab  Date  04/23/17  Educator  [facility]  Instruction Review Code  1- Verbalizes Understanding      Diabetes: - Individual verbal and written instruction to review signs/symptoms of diabetes, desired ranges of glucose level fasting, after meals and with exercise. Advice that pre and post exercise glucose checks will be done for 3 sessions at entry of program.   Chronic Lung Diseases: - Group verbal and written instruction to review updates, respiratory medications, advancements in procedures and treatments. Discuss use of supplemental oxygen including available portable oxygen systems, continuous and intermittent flow rates, concentrators, personal use and safety guidelines. Review proper use of inhaler and spacers. Provide informative websites for self-education.    Energy Conservation: - Provide group verbal and written instruction for methods to conserve energy, plan and organize activities. Instruct on pacing techniques, use of adaptive equipment and posture/positioning to relieve shortness of breath.   Pulmonary Rehab from 08/03/2017 in [facility] Cardiac and Pulmonary Rehab  Date  05/23/17  Educator  [facility]  Instruction Review Code  1- Verbalizes Understanding      Triggers and  Exacerbations: - Group verbal and written instruction to review types of environmental triggers and ways to prevent exacerbations. Discuss weather changes, air quality and the benefits of nasal washing. Review warning signs and symptoms to help prevent infections. Discuss techniques for effective airway clearance, coughing, and vibrations.   Pulmonary Rehab from 08/03/2017 in [facility] Cardiac and Pulmonary Rehab  Date  07/18/17  Educator  [facility]  Instruction Review Code  1- Verbalizes Understanding      AED/CPR: - Group verbal and written instruction with the use of models to demonstrate the basic use of the AED with the basic ABC's of resuscitation.   Pulmonary Rehab from 08/03/2017 in [facility] Cardiac and Pulmonary Rehab  Date  05/25/17  Educator  [facility]  Instruction Review Code  1- Actuary and Physiology of the Lungs: - Group verbal and written instruction with the use of models to provide basic lung anatomy and physiology related to function, structure and complications of lung disease.   Pulmonary Rehab from 08/03/2017 in [facility] Cardiac and Pulmonary Rehab  Date  06/06/17  Educator  [facility]  Instruction Review Code  1- Verbalizes Understanding      Anatomy & Physiology of the Heart: - Group verbal and written instruction and models provide basic cardiac anatomy and physiology, with the coronary electrical and arterial systems. Review of Valvular disease and Heart Failure   Pulmonary Rehab from 08/03/2017 in [facility] Cardiac and Pulmonary Rehab  Date  07/25/17  Educator  [facility]  Instruction Review Code  1- Verbalizes Understanding      Cardiac Medications: - Group verbal and written instruction to review commonly prescribed medications for heart disease. Reviews the medication, class of the drug, and side effects.   Pulmonary Rehab from 08/03/2017 in [facility] Cardiac and Pulmonary Rehab  Date  05/11/17  Educator  [facility]  Instruction Review Code  1- Verbalizes Understanding       Know Your Numbers and Risk Factors: -Group verbal and written instruction about important numbers in your health.  Discussion of what are risk factors and how they play a role in the disease process.  Review of Cholesterol, Blood Pressure, Diabetes, and BMI and the role they play in your overall health.   Pulmonary Rehab from 08/03/2017 in [facility] Cardiac and Pulmonary Rehab  Date  07/06/17  Educator  [facility]  Instruction Review Code  1- Verbalizes Understanding      Sleep Hygiene: -Provides group verbal and written instruction about how sleep can affect your health.  Define sleep hygiene, discuss sleep cycles and impact of sleep habits. Review good sleep hygiene tips.    Pulmonary Rehab from 08/03/2017 in [facility] Cardiac and Pulmonary Rehab  Date  07/11/17  Educator  [facility]  Instruction Review Code  1- Verbalizes Understanding      Other: -Provides group and verbal instruction on various topics (see comments)    Knowledge Questionnaire Score: Knowledge Questionnaire Score - 08/01/17 1015      Knowledge Questionnaire Score   Pre Score  14/18    Post Score  15/18 reviewed with patient        Core Components/Risk Factors/Patient Goals at Admission: Personal Goals and Risk Factors at Admission - 04/23/17 1443      Core Components/Risk Factors/Patient Goals on Admission    Weight Management  Yes    Intervention  Weight Management: Develop a combined nutrition and exercise program designed to reach desired caloric intake, while maintaining appropriate intake of nutrient and fiber, sodium and fats, and appropriate energy expenditure required for the weight goal.;Weight Management/Obesity: Establish reasonable short term and long term weight goals.;Weight Management: Provide education and appropriate resources to help participant work on and attain dietary goals.    Admit Weight  105 lb (47.6 kg)    Goal Weight: Short Term  105 lb (47.6 kg)    Goal Weight: Long Term  105 lb (47.6 kg)     Expected Outcomes  Short Term: Continue to assess and modify interventions until short term weight is achieved;Long Term: Adherence to nutrition and physical activity/exercise program aimed toward attainment of established weight goal;Weight Maintenance: Understanding of the daily nutrition guidelines, which includes 25-35% calories from fat, 7% or less cal from saturated fats, less than '200mg'$  cholesterol, less than 1.5gm of sodium, & 5 or more servings of fruits and vegetables daily    Improve shortness of breath with ADL's  Yes    Intervention  Provide education, individualized exercise plan and daily activity instruction to help decrease symptoms of SOB with activities of daily living.    Expected Outcomes  Short Term:  Improve cardiorespiratory fitness to achieve a reduction of symptoms when performing ADLs       Core Components/Risk Factors/Patient Goals Review:  Goals and Risk Factor Review    Row Name 05/16/17 1026 07/11/17 1035           Core Components/Risk Factors/Patient Goals Review   Personal Goals Review  Weight Management/Obesity;Improve shortness of breath with ADL's  Weight Management/Obesity;Improve shortness of breath with ADL's      Review  Avin has been doing well in [facility] and has been improving with her shortness of breath. Her blood pressure has been within normal limits. Her weight has been steady. She weighs 102 pounds. She is not trying to lose weight and watchers her weight closely.  Reaching and straining are some things that give her trouble breathing. Most of her household activities do not give her shortness of breath.  Clytee has been having trouble with her back pain lately. Her pain is waking her up in the middle of the night. She is interested in doing shots for pain rather than surgery. Her weight has been maintained. She weighs 100 pounds right now but wants to be no less than that. [place] shortness of breath has been ok but her strength has improved a  lot.      Expected Outcomes  Short: watch weight closely.  Long: Maintain current weight   Short: watch weight closely.  Long: Maintain current weight          Core Components/Risk Factors/Patient Goals at Discharge (Final Review):  Goals and Risk Factor Review - 07/11/17 1035      Core Components/Risk Factors/Patient Goals Review   Personal Goals Review  Weight Management/Obesity;Improve shortness of breath with ADL's    Review  Rosalyn has been having trouble with her back pain lately. Her pain is waking her up in the middle of the night. She is interested in doing shots for pain rather than surgery. Her weight has been maintained. She weighs 100 pounds right now but wants to be no less than that. Avriel shortness of breath has been ok but her strength has improved a lot.    Expected Outcomes  Short: watch weight closely.  Long: Maintain current weight        ITP Comments: ITP Comments    Row Name 04/23/17 1403 04/23/17 1445 05/07/17 0916 06/04/17 0825 06/18/17 0825   ITP Comments  Medical Evaluation completed. Chart sent for review and changes to Dr. Emily Filbert Director of [place]. Diagnosis can be found in [facility] encounter 04/23/17  patient is on Plavix to prevent stroke due to a hospital scare. She had slurred speech at one point but none of her test came back positive for a stroke.  30 day review completed. ITP sent to Dr. Emily Filbert Director of [place]. Continue with ITP unless changes are made by physician.  30 day review completed. ITP sent to Dr. Emily Filbert Director of [place]. Continue with ITP unless changes are made by physician.  Abagail called to say she will not be in [place] today due to a death in the family.   [place] Name 06/27/17 1022 07/02/17 0829 07/30/17 0826 08/06/17 1240     ITP Comments  Delicia is back today after being out for awhile. She had a tooth pulled, an infection presented itself in her foot and she could not ambulate well. She is participating in  [place] today and is doing good.   30 day review completed. ITP sent to Dr. Elta Guadeloupe  Investment banker, corporate of [facility]. Continue with ITP unless changes are made by physician   30 day review completed. ITP sent to Dr. Emily Filbert Director of [place]. Continue with ITP unless changes are made by physician  Discharge ITP sent and signed by Dr. Sabra Heck.  Discharge Summary routed to PCP and pulmonologist.       Comments: Discharge ITP

## 2017-08-06 NOTE — Progress Notes (Signed)
Discharge Progress Report  Patient Details  Name: Anne [name] MRN: [id number] Date of Birth: 1950-09-17 Referring Provider:     Pulmonary Rehab from 04/23/2017 in [facility]  Referring Provider  Merton Border MD       Number of Visits: 36/36  Reason for Discharge:  Patient reached a stable level of exercise. Patient independent in their exercise. Patient has met program and personal goals.  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[place])  ADL UCSD: Pulmonary Assessment Scores    Row Name 04/23/17 1422 06/06/17 1016 08/01/17 1014     ADL UCSD   ADL Phase  Entry  Mid  Exit   SOB Score total  _0 Rest  0  1  1   Walk  _1 Stairs  _2 Bath  0  0  0   Dress  0  0  0   Shop  1  0  1     CAT Score   CAT Score  14  -  15     mMRC Score   mMRC Score  2  -  -      Initial Exercise Prescription: Initial Exercise Prescription - 04/23/17 1500      Date of Initial Exercise RX and Referring Provider   Date  04/23/17    Referring Provider  Merton Border MD      Treadmill   MPH  2.6    Grade  1    Minutes  15    METs  3.35      NuStep   Level  2    SPM  80    Minutes  15    METs  3      REL-XR   Level  1    Speed  50    Minutes  15    METs  3      Prescription Details   Frequency (times per week)  3    Duration  Progress to 45 minutes of aerobic exercise without signs/symptoms of physical distress      Intensity   THRR 40-80% of Max Heartrate  100-135    Ratings of Perceived Exertion  11-13    Perceived Dyspnea  0-4      Progression   Progression  Continue to progress workloads to maintain intensity without signs/symptoms of physical distress.      Resistance Training   Training Prescription  Yes    Weight  3 lbs    Reps  10-15       Discharge Exercise Prescription (Final  Exercise Prescription Changes): Exercise Prescription Changes - 08/01/17 1400      Response to Exercise   Blood Pressure (Admit)  120/60    Blood Pressure (Exit)  116/64    Heart Rate (Admit)  64 bpm    Heart Rate (Exercise)  91 bpm    Heart Rate (Exit)  87 bpm    Oxygen Saturation (Admit)  100 %    Oxygen Saturation (Exercise)  99 %    Oxygen Saturation (Exit)  92 %    Rating of Perceived Exertion (Exercise)  12    Perceived Dyspnea (Exercise)  1    Duration  Continue with 45 min of aerobic exercise without  signs/symptoms of physical distress.    Intensity  THRR unchanged      Progression   Progression  Continue to progress workloads to maintain intensity without signs/symptoms of physical distress.    Average METs  4.12 from 6 min walk      Resistance Training   Training Prescription  Yes    Weight  3 lb    Reps  10-15      Interval Training   Interval Training  No      NuStep   Level  3    SPM  120    Minutes  15    METs  2.4      Home Exercise Plan   Plans to continue exercise at  [employer] (comment) [place] and walk    Frequency  Add 2 additional days to program exercise sessions.    Initial Home Exercises Provided  05/02/17       Functional Capacity: 6 Minute Walk    Row Name 04/23/17 1535 08/01/17 1026       6 Minute Walk   Phase  Initial  Discharge    Distance  1380 feet  1677 feet    Distance % Change  -  21.6 %    Distance Feet Change  -  298 ft    Walk Time  6 minutes  6 minutes    # of Rest Breaks  0  0    MPH  2.61  3.17    METS  3.69  4.12    RPE  15  12    Perceived Dyspnea   3.5  1    VO2 Peak  12.9  14.43    Symptoms  Yes (comment)  Yes (comment)    Comments  wheezing with breathing and SOB  back pain 4/10    Resting HR  64 bpm  64 bpm    Resting BP  132/64  120/60    Resting Oxygen Saturation   98 %  100 %    Exercise Oxygen Saturation  during 6 min walk  95 %  91 %    Max Ex. HR  64 bpm  99 bpm    Max Ex. BP  138/64   128/74    2 Minute Post BP  126/60  112/60      Interval HR   1 Minute HR  96  68    2 Minute HR  102  -    3 Minute HR  103  -    4 Minute HR  99  67    5 Minute HR  104  69    6 Minute HR  102  99    2 Minute Post HR  75  74    Interval Heart Rate?  Yes  Yes      Interval Oxygen   Interval Oxygen?  Yes  Yes    Baseline Oxygen Saturation %  98 %  100 %    1 Minute Oxygen Saturation %  - equipment error  97 %    1 Minute Liters of Oxygen  0 L Room Air  0 L Room Air    2 Minute Oxygen Saturation %  98 %  -    2 Minute Liters of Oxygen  0 L  0 L    3 Minute Oxygen Saturation %  97 %  93 %    3 Minute Liters of Oxygen  0 L  0  L    4 Minute Oxygen Saturation %  95 %  91 %    4 Minute Liters of Oxygen  0 L  0 L    5 Minute Oxygen Saturation %  100 %  96 %    5 Minute Liters of Oxygen  0 L  0 L    6 Minute Oxygen Saturation %  97 %  96 %    6 Minute Liters of Oxygen  0 L  0 L    2 Minute Post Oxygen Saturation %  100 %  100 %    2 Minute Post Liters of Oxygen  0 L  0 L       Psychological, QOL, Others - Outcomes: PHQ 2/9: Depression screen [facility] 2/9 08/01/2017 06/06/2017 04/23/2017 12/13/2016 10/28/2015  Decreased Interest 0 0 0 0 0  Down, Depressed, Hopeless 0 0 0 0 0  PHQ - 2 Score 0 0 0 0 0  Altered sleeping _0 - -  Tired, decreased energy _1 - -  Change in appetite 0 0 0 - -  Feeling bad or failure about yourself  0 0 0 - -  Trouble concentrating 0 0 0 - -  Moving slowly or fidgety/restless 0 0 0 - -  Suicidal thoughts 0 0 0 - -  PHQ-9 Score _2 - -  Difficult doing work/chores - Not difficult at all Not difficult at all - -  Some recent data might be hidden    Quality of Life:   Personal Goals: Goals established at orientation with interventions provided to work toward goal. Personal Goals and Risk Factors at Admission - 04/23/17 1443      Core Components/Risk Factors/Patient Goals on Admission    Weight Management  Yes    Intervention  Weight Management:  Develop a combined nutrition and exercise program designed to reach desired caloric intake, while maintaining appropriate intake of nutrient and fiber, sodium and fats, and appropriate energy expenditure required for the weight goal.;Weight Management/Obesity: Establish reasonable short term and long term weight goals.;Weight Management: Provide education and appropriate resources to help participant work on and attain dietary goals.    Admit Weight  105 lb (47.6 kg)    Goal Weight: Short Term  105 lb (47.6 kg)    Goal Weight: Long Term  105 lb (47.6 kg)    Expected Outcomes  Short Term: Continue to assess and modify interventions until short term weight is achieved;Long Term: Adherence to nutrition and physical activity/exercise program aimed toward attainment of established weight goal;Weight Maintenance: Understanding of the daily nutrition guidelines, which includes 25-35% calories from fat, 7% or less cal from saturated fats, less than 255m cholesterol, less than 1.5gm of sodium, & 5 or more servings of fruits and vegetables daily    Improve shortness of breath with ADL's  Yes    Intervention  Provide education, individualized exercise plan and daily activity instruction to help decrease symptoms of SOB with activities of daily living.    Expected Outcomes  Short Term: Improve cardiorespiratory fitness to achieve a reduction of symptoms when performing ADLs        Personal Goals Discharge: Goals and Risk Factor Review    Row Name 05/16/17 1026 07/11/17 1035           Core Components/Risk Factors/Patient Goals Review   Personal Goals Review  Weight Management/Obesity;Improve shortness of breath with ADL's  Weight Management/Obesity;Improve shortness of breath with ADL's  Review  Iverna has been doing well in [facility] and has been improving with her shortness of breath. Her blood pressure has been within normal limits. Her weight has been steady. She weighs 102 pounds. She is not trying  to lose weight and watchers her weight closely.  Reaching and straining are some things that give her trouble breathing. Most of her household activities do not give her shortness of breath.  Jahnia has been having trouble with her back pain lately. Her pain is waking her up in the middle of the night. She is interested in doing shots for pain rather than surgery. Her weight has been maintained. She weighs 100 pounds right now but wants to be no less than that. [place] shortness of breath has been ok but her strength has improved a lot.      Expected Outcomes  Short: watch weight closely.  Long: Maintain current weight   Short: watch weight closely.  Long: Maintain current weight          Exercise Goals and Review: Exercise Goals    Row Name 04/23/17 1542             Exercise Goals   Increase Physical Activity  Yes       Intervention  Provide advice, education, support and counseling about physical activity/exercise needs.;Develop an individualized exercise prescription for aerobic and resistive training based on initial evaluation findings, risk stratification, comorbidities and participant's personal goals.       Expected Outcomes  Short Term: Attend rehab on a regular basis to increase amount of physical activity.;Long Term: Add in home exercise to make exercise part of routine and to increase amount of physical activity.;Long Term: Exercising regularly at least 3-5 days a week.       Increase Strength and Stamina  Yes       Intervention  Provide advice, education, support and counseling about physical activity/exercise needs.;Develop an individualized exercise prescription for aerobic and resistive training based on initial evaluation findings, risk stratification, comorbidities and participant's personal goals.       Expected Outcomes  Short Term: Increase workloads from initial exercise prescription for resistance, speed, and METs.;Short Term: Perform resistance training exercises routinely  during rehab and add in resistance training at home;Long Term: Improve cardiorespiratory fitness, muscular endurance and strength as measured by increased METs and functional capacity (6MWT)       Able to understand and use rate of perceived exertion (RPE) scale  Yes       Intervention  Provide education and explanation on how to use RPE scale       Expected Outcomes  Short Term: Able to use RPE daily in rehab to express subjective intensity level;Long Term:  Able to use RPE to guide intensity level when exercising independently       Able to understand and use Dyspnea scale  Yes       Intervention  Provide education and explanation on how to use Dyspnea scale       Expected Outcomes  Short Term: Able to use Dyspnea scale daily in rehab to express subjective sense of shortness of breath during exertion;Long Term: Able to use Dyspnea scale to guide intensity level when exercising independently       Knowledge and understanding of Target Heart Rate Range (THRR)  Yes       Intervention  Provide education and explanation of THRR including how the numbers were predicted and where they are located for reference  Expected Outcomes  Short Term: Able to state/look up THRR;Short Term: Able to use daily as guideline for intensity in rehab;Long Term: Able to use THRR to govern intensity when exercising independently       Able to check pulse independently  Yes       Intervention  Provide education and demonstration on how to check pulse in carotid and radial arteries.;Review the importance of being able to check your own pulse for safety during independent exercise       Expected Outcomes  Short Term: Able to explain why pulse checking is important during independent exercise;Long Term: Able to check pulse independently and accurately       Understanding of Exercise Prescription  Yes       Intervention  Provide education, explanation, and written materials on patient's individual exercise prescription        Expected Outcomes  Short Term: Able to explain program exercise prescription;Long Term: Able to explain home exercise prescription to exercise independently          Nutrition & Weight - Outcomes: Pre Biometrics - 04/23/17 1543      Pre Biometrics   Height  5' 3.2" (1.605 m)    Weight  105 lb 1.6 oz (47.7 kg)    Waist Circumference  24 inches    Hip Circumference  34.5 inches    Waist to Hip Ratio  0.7 %    BMI (Calculated)  18.51      Post Biometrics - 08/01/17 1028       Post  Biometrics   Height  5' 3.2" (1.605 m)    Weight  100 lb (45.4 kg)    Waist Circumference  23 inches    Hip Circumference  34 inches    Waist to Hip Ratio  0.68 %    BMI (Calculated)  17.61       Nutrition: Nutrition Therapy & Goals - 04/23/17 1420      Personal Nutrition Goals   Comments  She cannot eat alot at one time. Some things are hard for her to swallow. She has learned how to eat over the years for her to be comfortable.      Intervention Plan   Intervention  Prescribe, educate and counsel regarding individualized specific dietary modifications aiming towards targeted core components such as weight, hypertension, lipid management, diabetes, heart failure and other comorbidities.;Nutrition handout(s) given to patient.    Expected Outcomes  Short Term Goal: Understand basic principles of dietary content, such as calories, fat, sodium, cholesterol and nutrients.;Long Term Goal: Adherence to prescribed nutrition plan.       Nutrition Discharge: Nutrition Assessments - 04/23/17 1441      MEDFICTS Scores   Pre Score  28       Education Questionnaire Score: Knowledge Questionnaire Score - 08/01/17 1015      Knowledge Questionnaire Score   Pre Score  14/18    Post Score  15/18 reviewed with patient       Goals reviewed with patient; copy given to patient.

## 2017-08-06 NOTE — Progress Notes (Signed)
Daily Session Note  Patient Details  Name: Anne [name] MRN: [id number] Date of Birth: 1950/06/14 Referring Provider:     Pulmonary Rehab from 04/23/2017 in [facility] Re[facility]  Referring Provider  Merton Border MD      Encounter Date: 08/06/2017  Check In: Session Check In - 08/06/17 1028      Check-In   Location  [facility]-Cardiac & Pulmonary Rehab    Staff Present  Nyoka Cowden, RN, BSN, [place], BS, ACSM CEP, Exercise Physiologist;Amanda Oletta Darter, [place], ACSM CEP, Exercise Physiologist    Supervising physician immediately available to respond to emergencies  [facility] immediately available ER MD    Physician(s)  Dr. Corky Downs and Dr. Alfred Levins    Medication changes reported      No    Fall or balance concerns reported     No    Tobacco Cessation  No Change    Warm-up and Cool-down  Performed as group-led instruction    Resistance Training Performed  Yes    VAD Patient?  No      Pain Assessment   Currently in Pain?  No/denies    Multiple Pain Sites  No          Social History   Tobacco Use  Smoking Status Never Smoker  Smokeless Tobacco Never Used    Goals Met:  Proper associated with RPD/PD & O2 Sat Independence with exercise equipment Exercise tolerated well No report of cardiac concerns or symptoms Strength training completed today  Goals Unmet:  Not Applicable  Comments: Pt able to follow exercise prescription today without complaint.  Will continue to monitor for progression.  Nadja graduated today from  rehab with 36 sessions completed.  Details of the patient's exercise prescription and what She needs to do in order to continue the prescription and progress were discussed with patient.  Patient was given a copy of prescription and goals.  Patient verbalized understanding.  [facility]     Dr. Emily Filbert is Medical Director for [place] and [facility].

## 2017-08-07 ENCOUNTER — Other Ambulatory Visit: Payer: Self-pay | Admitting: Family Medicine

## 2017-08-07 DIAGNOSIS — D649 Anemia, unspecified: Secondary | ICD-10-CM

## 2017-08-09 ENCOUNTER — Other Ambulatory Visit (INDEPENDENT_AMBULATORY_CARE_PROVIDER_SITE_OTHER): Payer: Medicare Other

## 2017-08-09 DIAGNOSIS — D649 Anemia, unspecified: Secondary | ICD-10-CM | POA: Diagnosis not present

## 2017-08-10 LAB — IRON,TIBC AND FERRITIN PANEL
%SAT: 15 % (calc) (ref 11–50)
Ferritin: 8 ng/mL — ABNORMAL LOW (ref 20–288)
Iron: 60 ug/dL (ref 45–160)
TIBC: 396 mcg/dL (calc) (ref 250–450)

## 2017-08-13 ENCOUNTER — Other Ambulatory Visit: Payer: Self-pay | Admitting: Family Medicine

## 2017-08-13 DIAGNOSIS — D509 Iron deficiency anemia, unspecified: Secondary | ICD-10-CM

## 2017-08-14 ENCOUNTER — Other Ambulatory Visit (INDEPENDENT_AMBULATORY_CARE_PROVIDER_SITE_OTHER): Payer: Medicare Other

## 2017-08-14 DIAGNOSIS — D509 Iron deficiency anemia, unspecified: Secondary | ICD-10-CM

## 2017-08-14 LAB — POCT URINALYSIS DIPSTICK
Bilirubin, UA: NEGATIVE
Blood, UA: NEGATIVE
Glucose, UA: NEGATIVE
Ketones, UA: NEGATIVE
Nitrite, UA: NEGATIVE
Protein, UA: NEGATIVE
Spec Grav, UA: 1.01 (ref 1.010–1.025)
Urobilinogen, UA: 0.2 E.U./dL
pH, UA: 6 (ref 5.0–8.0)

## 2017-08-23 ENCOUNTER — Telehealth: Payer: Self-pay | Admitting: *Deleted

## 2017-08-23 ENCOUNTER — Encounter: Payer: Self-pay | Admitting: Physician Assistant

## 2017-08-23 ENCOUNTER — Other Ambulatory Visit: Payer: Self-pay | Admitting: Family Medicine

## 2017-08-23 ENCOUNTER — Ambulatory Visit (INDEPENDENT_AMBULATORY_CARE_PROVIDER_SITE_OTHER): Payer: Medicare Other | Admitting: Physician Assistant

## 2017-08-23 VITALS — BP 92/62 | HR 72 | Ht 63.0 in | Wt 101.6 lb

## 2017-08-23 DIAGNOSIS — K220 Achalasia of cardia: Secondary | ICD-10-CM | POA: Diagnosis not present

## 2017-08-23 DIAGNOSIS — D509 Iron deficiency anemia, unspecified: Secondary | ICD-10-CM | POA: Diagnosis not present

## 2017-08-23 MED ORDER — FERROUS SULFATE 325 (65 FE) MG PO TABS: 325.0000 mg | ORAL_TABLET | Freq: Every day | ORAL | 2 refills | Status: DC

## 2017-08-23 MED ORDER — NA SULFATE-K SULFATE-MG SULF 17.5-3.13-1.6 GM/177ML PO SOLN: 1.0000 | Freq: Once | ORAL | 0 refills | Status: AC

## 2017-08-23 NOTE — Telephone Encounter (Signed)
Dr. Tommi Rumps is family medicine provider. Please request him for clearance.   Thanks   [employer], PAC

## 2017-08-23 NOTE — Progress Notes (Addendum)
Chief Complaint: IDA  HPI:   Anne [name] is a 67 year old Caucasian female with a past medical history as listed below, who follows with Dr. Hilarie Fredrickson and who was referred to me by [name], MD for a complaint of IDA.      08/03/17 CBC with a hemoglobin of 10.2 (7 months ago 13). 08/09/17 Iron studies showing a ferritin of 8, percent saturation 15 compared to 34 4 years ago.    06/19/16 office visit with Dr. Silverio Decamp to discuss achalasia.    Today, explains that she was recently told that her anemia is worsening. Per primary care provider she needed to be seen in our clinic. Does describe variance in her bowel habits between constipation and diarrhea. She has worked with Dr. Hilarie Fredrickson in the past to try and resolve this and is currently just trying to balance this with her diet and use of occasional laxatives. Tried Linzess in the past which did not help. Also describes occasional bright red blood in her stool related to rectal irritation. Also some vague history of what sounds like severe abdominal pain and liquid stool which can last for a few days at a time and occasionally patient sees blood then. Describes taking Prednisone at these times which seems to help with symptoms.    Denies fever, chills, shortness of breath, palpitations, melena, weight loss, anorexia, nausea or vomiting.  Past Medical History:  Diagnosis Date  . Allergic rhinitis   . Anemia   . Asthma   . Complication of anesthesia    Breathing problems upon waking up. 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  . Compressed cervical disc   . COPD (chronic obstructive pulmonary disease) ([place])   . CVA (cerebral infarction)   . Dyspnea   . Esophageal dysmotility   . Heart murmur    as child  . History of hiatal hernia   . IBS (irritable bowel syndrome)   . Migraine   . PICC (peripherally inserted central catheter) removal 02/20/2017  . PONV (postoperative nausea and vomiting)   . Problems with  swallowing    intermittently  . Pulmonary fibrosis ([place])   . Shingles   . Stroke [facility])    slurred speech, drawn face, imaging normal, occurred twice, [facility]-"TIA" if antything" 02/20/17-no residual effects  . Tracheostomy in place [facility])   . Vocal cord paresis     Past Surgical History:  Procedure Laterality Date  . ABDOMINAL HYSTERECTOMY    . APPLICATION OF A-CELL OF HEAD/NECK N/A 02/21/2017   Procedure: APPLICATION OF A-CELL OF HEAD/NECK;  Surgeon: Wallace Going, DO;  Location: [place];  Service: Plastics;  Laterality: N/A;  . BOTOX INJECTION N/A 07/29/2013   Procedure: BOTOX INJECTION;  Surgeon: Jerene Bears, MD;  Location[facility];  Service: Gastroenterology;  Laterality: N/A;  . BOTOX INJECTION N/A 05/04/2015   Procedure: BOTOX INJECTION;  Surgeon: Jerene Bears, MD;  [facility];  Service: Gastroenterology;  Laterality: N/A;  . BREAST BIOPSY Left    neg  . BREAST SURGERY Left 2002   bx of skin  . CHOLECYSTECTOMY    . COLONOSCOPY    . ESOPHAGEAL MANOMETRY N/A 12/16/2012   Procedure: ESOPHAGEAL MANOMETRY (EM);  Surgeon: Jerene Bears, MD;  [facility];  Service: Gastroenterology;  Laterality: N/A;  . ESOPHAGOGASTRODUODENOSCOPY (EGD) WITH PROPOFOL N/A 07/29/2013   Procedure: ESOPHAGOGASTRODUODENOSCOPY (EGD) WITH PROPOFOL;  Surgeon: Jerene Bears, MD;  [facility];  Service: Gastroenterology;  Laterality: N/A;  with botox injection  . ESOPHAGOGASTRODUODENOSCOPY (EGD) WITH PROPOFOL N/A 05/04/2015   Procedure: ESOPHAGOGASTRODUODENOSCOPY (EGD) WITH PROPOFOL;  Surgeon: Jerene Bears, MD;  [facility];  Service: Gastroenterology;  Laterality: N/A;  . EYE SURGERY     Catarct surgery 2014  . Eye Surgery AS Child Left   . INCISION AND DRAINAGE OF WOUND N/A 02/21/2017   Procedure: IRRIGATION AND DEBRIDEMENT WOUND NECK;  Surgeon: Wallace Going, DO;  Location: [place];  Service: Plastics;  Laterality: N/A;  . JEJUNOSTOMY FEEDING TUBE     x2  both failed. no longer has  . MULTIPLE TOOTH EXTRACTIONS     2 teeth removed  . POSTERIOR CERVICAL FUSION/FORAMINOTOMY N/A 01/10/2017   Procedure: LAMINECTOMY AND FORAMINOTOMY CERVICAL FOUR-CERVICAL FIVE, CERVICAL FIVE-SIX POSTERIOR CERVICAL INSTRUMENT FUSION CERVICAL THREE-CERVICAL SEVEN,CERVICAL LAMINECTOMY CERVICAL THREE-CERVICAL SEVEN.;  Surgeon: Eustace Moore, MD;  Location: [place];  Service: Neurosurgery;  Laterality: N/A;  posterior  . TRACHEOSTOMY  1996   done at [facility], Dr. Kathyrn Sheriff  . TUBAL LIGATION    . VIDEO BRONCHOSCOPY Bilateral 11/20/2012   Procedure: VIDEO BRONCHOSCOPY WITH FLUORO;  Surgeon: Juanito Doom, MD;  [facility];  Service: Cardiopulmonary;  Laterality: Bilateral;    Current Outpatient Medications  Medication Sig Dispense Refill  . acetaminophen (TYLENOL) 500 MG tablet Take 500 mg by mouth daily as needed for moderate pain or headache.    . albuterol (PROVENTIL HFA;VENTOLIN HFA) 108 (90 Base) MCG/ACT inhaler Inhale 2 puffs into the lungs every 6 (six) hours as needed for wheezing or shortness of breath. 1 Inhaler 5  . ALPRAZolam (XANAX) 0.25 MG tablet TAKE ONE-HALF TABLET BY MOUTH TWICE DAILY AS NEEDED (Patient taking differently: 1/2 TABLET ONCE A DAY) 30 tablet 0  . cetirizine (ZYRTEC) 10 MG tablet TAKE ONE TABLET EVERY DAY 30 tablet 5  . clopidogrel (PLAVIX) 75 MG tablet TAKE ONE TABLET BY MOUTH EVERY MORNING 90 tablet 1  . EPINEPHrine (EPIPEN 2-PAK) 0.3 mg/0.3 mL IJ SOAJ injection Inject 0.3 mLs (0.3 mg total) into the muscle once. (Patient taking differently: Inject 0.3 mg once as needed into the muscle (FOR AN ALLERGIC REACTION). ) 1 Device 0  . estradiol (ESTRACE) 1 MG tablet TAKE ONE TABLET BY MOUTH EVERY DAY 90 tablet 1  . fluticasone (FLONASE) 50 MCG/ACT nasal spray PLACE 2 SPRAYS INTO BOTH NOSTRILS DAILY. (Patient taking differently: Place 2 sprays into both nostrils daily as needed for allergies. ) 16 g 2  . furosemide (LASIX) 20 MG tablet TAKE  ONE TABLET EVERY DAY AS NEEDED 90 tablet 1  . hydroxypropyl methylcellulose (ISOPTO TEARS) 2.5 % ophthalmic solution Place 1 drop into both eyes 3 (three) times daily as needed for dry eyes.    [name] ipratropium-albuterol (DUONEB) 0.5-2.5 (3) MG/3ML SOLN Take 3 mLs by nebulization every 4 (four) hours as needed. Dx 496 120 mL 2  . meloxicam (MOBIC) 15 MG tablet Take 15 mg by mouth daily.    . montelukast (SINGULAIR) 10 MG tablet TAKE ONE TABLET BY MOUTH AT BEDTIME 90 tablet 1  . mupirocin ointment (BACTROBAN) 2 % Apply 1 application daily as needed topically (FOR [facility] SITE IRRITATION).     [name] pantoprazole (PROTONIX) 40 MG tablet Take 1 tablet (40 mg total) by mouth daily. 90 tablet 0  . potassium chloride 20 MEQ/15ML (10%) SOLN Take 15 mLs (20 mEq total) by mouth daily as needed (cramps). (Patient taking differently: Take 20 mEq daily as needed by mouth (for cramping). ) 240 mL  0  . Probiotic Product (PROBIOTIC PO) Take 1 capsule daily as needed by mouth (FOR G.I. DISTRESS).     . sodium chloride (OCEAN) 0.65 % SOLN nasal spray Place 1 spray into both nostrils every 4 (four) hours as needed for congestion.     [name] tiZANidine (ZANAFLEX) 2 MG tablet Take 2 mg by mouth every 6 (six) hours as needed for muscle spasms.     Current Facility-Administered Medications  Medication Dose Route Frequency Provider Last Rate Last Dose  . ipratropium-albuterol (DUONEB) 0.5-2.5 (3) MG/3ML nebulizer solution 3 mL  3 mL Nebulization Q6H Burnard Hawthorne, FNP   3 mL at 05/18/17 1437    Allergies as of 08/23/2017 - Review Complete 08/23/2017  Allergen Reaction Noted  . Asa [aspirin] Shortness Of Breath, Swelling, and Other (See Comments) 08/05/2012  . Bee venom Anaphylaxis and Shortness Of Breath 02/08/2017  . Eggs or egg-derived products Shortness Of Breath and Other (See Comments) 08/13/2012  . Influenza vaccines Shortness Of Breath and Other (See Comments) 01/14/2013  . Shellfish allergy Anaphylaxis 08/13/2012  .  Quinolones Swelling and Other (See Comments) 07/01/2013  . Ciprofloxacin Swelling and Other (See Comments) 07/01/2013  . Clarithromycin Nausea Only 10/23/2013  . Erythromycin Nausea Only 10/23/2013  . Levaquin [levofloxacin in d5w] Swelling and Other (See Comments) 08/05/2012  . Peanut-containing drug products Other (See Comments) 01/04/2017  . Septra [sulfamethoxazole-trimethoprim] Diarrhea 04/18/2013  . Telithromycin Nausea Only 10/23/2013  . Zanaflex [tizanidine] Other (See Comments) 02/08/2017  . Adhesive [tape] Rash and Other (See Comments) 08/03/2016  . Epinephrine Palpitations and Other (See Comments) 10/23/2013    Family History  Problem Relation Age of Onset  . Asthma Cousin   . COPD Cousin   . Breast cancer Maternal Grandmother 60  . Cancer Maternal Grandmother        breast  . Asthma Father   . Cancer Father        lung  . Kidney cancer Father   . Cancer Paternal Uncle        lung  . COPD Paternal Grandfather   . Breast cancer Maternal Aunt 71  . Cancer Maternal Aunt        breast  . Bladder Cancer Neg Hx     Social History   Socioeconomic History  . Marital status: Married    Spouse name: Not on file  . Number of children: 4  . Years of education: Not on file  . Highest education level: Not on file  Occupational History  . Not on file  Social Needs  . Financial resource strain: Not on file  . Food insecurity:    Worry: Not on file    Inability: Not on file  . Transportation needs:    Medical: Not on file    Non-medical: Not on file  Tobacco Use  . Smoking status: Never Smoker  . Smokeless tobacco: Never Used  Substance and Sexual Activity  . Alcohol use: No    Alcohol/week: 0.0 oz  . Drug use: No  . Sexual activity: Not on file  Lifestyle  . Physical activity:    Days per week: Not on file    Minutes per session: Not on file  . Stress: Not on file  Relationships  . Social connections:    Talks on phone: Not on file    Gets together: Not  on file    Attends religious service: Not on file    Active member of club or organization: Not on  file    Attends meetings of clubs or organizations: Not on file    Relationship status: Not on file  . Intimate partner violence:    Fear of current or ex partner: Not on file    Emotionally abused: Not on file    Physically abused: Not on file    Forced sexual activity: Not on file  Other Topics Concern  . Not on file  Social History Narrative   Lives in [place] with husband.  She has four children.   Retired from [employer]:    Constitutional: No weight loss, fever or chills Cardiovascular: No chest pain Respiratory: No SOB  Gastrointestinal: See HPI and otherwise negative   Physical Exam:  Vital signs: BP 92/62   Pulse 72   Ht 5\' 3"  (1.6 m)   Wt 101 lb 9.6 oz (46.1 kg)   BMI 18.00 kg/m   Constitutional:   Pleasant thin-appearing Caucasian female appears to be in NAD, Well developed, Well nourished, alert and cooperative Respiratory: Respirations even and unlabored. Lungs clear to auscultation bilaterally.   No wheezes, crackles, or rhonchi.  Cardiovascular: Normal S1, S2. No MRG. Regular rate and rhythm. No peripheral edema, cyanosis or pallor.  Gastrointestinal:  Soft, nondistended, nontender. No rebound or guarding. Normal bowel sounds. No appreciable masses or hepatomegaly. Psychiatric:  Demonstrates good judgement and reason without abnormal affect or behaviors.  RELEVANT LABS AND IMAGING: CBC    Component Value Date/Time   WBC 4.0 08/03/2017 1347   RBC 3.47 (L) 08/03/2017 1347   HGB 10.2 (L) 08/03/2017 1347   HGB 12.6 02/17/2014 1303   HCT 30.3 (L) 08/03/2017 1347   HCT 37.9 02/17/2014 1303   PLT 208.0 08/03/2017 1347   PLT 208 02/17/2014 1303   MCV 87.2 08/03/2017 1347   MCV 97 02/17/2014 1303   MCH 30.8 02/08/2017 1935   MCHC 33.6 08/03/2017 1347   RDW 15.0 08/03/2017 1347   RDW 12.5 02/17/2014 1303   LYMPHSABS 0.9  06/22/2017 1514   LYMPHSABS 1.2 02/17/2014 1303   MONOABS 0.3 06/22/2017 1514   MONOABS 0.3 02/17/2014 1303   EOSABS 0.2 06/22/2017 1514   EOSABS 0.2 02/17/2014 1303   BASOSABS 0.0 06/22/2017 1514   BASOSABS 0.0 02/17/2014 1303    CMP     Component Value Date/Time   NA 140 06/22/2017 1514   K 3.7 06/22/2017 1514   K 3.6 04/03/2012 1653   CL 106 06/22/2017 1514   CO2 29 06/22/2017 1514   GLUCOSE 106 (H) 06/22/2017 1514   BUN 15 06/22/2017 1514   CREATININE 0.93 06/22/2017 1514   CREATININE 0.94 10/30/2016 1135   CALCIUM 8.8 06/22/2017 1514   PROT 6.3 06/22/2017 1514   ALBUMIN 3.5 06/22/2017 1514   AST 20 06/22/2017 1514   ALT 14 06/22/2017 1514   ALKPHOS 110 06/22/2017 1514   BILITOT 0.4 06/22/2017 1514   GFRNONAA 53 (L) 02/08/2017 1935   GFRAA >60 02/08/2017 1935    Assessment: 1. IDA: 08/03/17 hemoglobin 10.2, ferritin also low recently; consider GI source of blood loss versus other 2. Achalasia:Does continue with chronic dysphagia symptoms  Plan: 1. Scheduled patient for an EGD and colonoscopy in [place] with Dr. Hilarie Fredrickson for further evaluation of IDA. Discussed risks, benefits, limitations and alternatives and the patient agrees to proceed. 2. Recommend patient start Ferrous sulfate 325 mg daily. Patient discussed that she is unable to tolerate oral iron in  the past, recommend she call and let us know how she does with this. 3. Patient was advised to hold her Plavix for 5 days prior to time procedure. We will communicate with Dr. [name] to ensure that holding her Plavix is acceptable for her. 4. Patient to follow in clinic per recommendations from Dr. Hilarie Fredrickson after time of procedure.  Ellouise Newer, PA-C [facility] 08/23/2017, 11:28 AM   Addendum: Reviewed and agree with initial management. Pyrtle, Lajuan Lines, MD    Cc: [name], MD

## 2017-08-23 NOTE — Telephone Encounter (Signed)
[facility] Pre-operative Risk Assessment     Request for surgical clearance:     Endoscopy Procedure  What type of surgery is being performed?     Colonoscopy/Endoscopy  When is this surgery scheduled?   10-02-2017    What type of clearance is required ?   Pharmacy  Are there any medications that need to be held prior to surgery and how long? PLavix- Dr. Tommi Rumps manages the Plavix.  Practice name and name of physician performing surgery?      [facility]  What is your office phone and fax number?      Phone- [phone]  [phone]  Anesthesia type (None, local, MAC, general) ?       MAC

## 2017-08-23 NOTE — Telephone Encounter (Signed)
08/23/2017   RE: Anne [name] DOB: 13-Nov-1950 MRN: [id number]   Dear Dr. Tommi Rumps,    We have scheduled the above patient for an endoscopic procedure. Our records show that she is on anticoagulation therapy.   Please advise as to how long the patient may come off her therapy of Plavix prior to the procedure, which is scheduled for 10-02-2017.  Please  route the Plavix clearance directions to [facility] CMA.   Sincerely,    Ellouise Newer PA-C

## 2017-08-23 NOTE — Patient Instructions (Signed)
We have sent the following medications to your pharmacy for you to pick up at your convenience:ferrous sulfate 325 mg to take one tablet by mouth daily.    You have been scheduled for an endoscopy and colonoscopy. Please follow the written instructions given to you at your visit today. Please pick up your prep supplies at the pharmacy within the next 1-3 days. If you use inhalers (even only as needed), please bring them with you on the day of your procedure. Your physician has requested that you go to www.startemmi.com and enter the access code given to you at your visit today. This web site gives a general overview about your procedure. However, you should still follow specific instructions given to you by our office regarding your preparation for the procedure.  You may have a light breakfast the morning of prep day (the day before the procedure).   You may choose from one of the following items: eggs and toast OR chicken noodle soup and crackers.   You should have your breakfast completed between 8:00 and 9:00 am the day before your procedure.    After you have had your light breakfast you should start a clear liquid diet only, NO SOLIDS. No additional solid food is allowed. You may continue to have clear liquid up to 3 hours prior to your procedure.

## 2017-08-24 ENCOUNTER — Other Ambulatory Visit: Payer: Self-pay | Admitting: Family

## 2017-08-24 NOTE — Telephone Encounter (Signed)
Her plavix should be stopped 5 days prior to her procedure. Please let me know if you have any other questions. Dr [name].

## 2017-08-24 NOTE — Telephone Encounter (Signed)
Okay to refill Xanax? Last approved on 07/27/17.  LOV: 08/03/17 NOV: 02/04/18

## 2017-09-03 ENCOUNTER — Telehealth: Payer: Self-pay | Admitting: Pulmonary Disease

## 2017-09-03 NOTE — Telephone Encounter (Signed)
Pt calling asking if we may be able to send in some kind of cough syrup  She states she's feeling better but the cough she has is really bad.   Just wanted to know if we can help her with this  Please advise

## 2017-09-03 NOTE — Telephone Encounter (Signed)
LM for the patient to please call me regarding the Plavix directions for the colonoscopy.

## 2017-09-04 ENCOUNTER — Ambulatory Visit (INDEPENDENT_AMBULATORY_CARE_PROVIDER_SITE_OTHER): Payer: Medicare Other | Admitting: *Deleted

## 2017-09-04 DIAGNOSIS — E538 Deficiency of other specified B group vitamins: Secondary | ICD-10-CM | POA: Diagnosis not present

## 2017-09-04 MED ORDER — CYANOCOBALAMIN 1000 MCG/ML IJ SOLN
1000.0000 ug | Freq: Once | INTRAMUSCULAR | Status: AC
  Administered 2017-09-04: 1000 ug via INTRAMUSCULAR

## 2017-09-04 MED ORDER — HYDROCOD POLST-CPM POLST ER 10-8 MG/5ML PO SUER: 5.0000 mL | Freq: Every evening | ORAL | 0 refills | Status: DC | PRN

## 2017-09-04 NOTE — Telephone Encounter (Signed)
Patient returned phone call. °

## 2017-09-04 NOTE — Telephone Encounter (Signed)
LM for the patient advising the directions for the Plavix clearance for the Colonoscopy. I advised she is to hold the Plavix starting on 7-4 through 7-9 and she is to resume if after the procedure, either the night of the 9th or the next day. I asked her to please call me if she has any questions.

## 2017-09-04 NOTE — Progress Notes (Signed)
Patient presented for B 12 injection to right deltoid, patient voiced no concerns nor showed any signs of distress during injection. 

## 2017-09-04 NOTE — Telephone Encounter (Signed)
Per DS, Tussionex 81ml QHS PRN. Printed and signed by DS. Informed pt to pick up RX. Nothing further needed.

## 2017-09-04 NOTE — Telephone Encounter (Signed)
Pt wanted to make sure we call her cell number [phone]

## 2017-09-04 NOTE — Telephone Encounter (Signed)
Pt called just to let you know that she heard your message about BT.

## 2017-09-04 NOTE — Telephone Encounter (Signed)
Per DS, pt needs to try Delsym OTC Q12hrs. Pt states she has been doing Delsym and she has drainage and cough but cough is worse at night.

## 2017-09-04 NOTE — Telephone Encounter (Signed)
Patient called to advise she got my message and understands the instructions.

## 2017-09-18 ENCOUNTER — Ambulatory Visit: Payer: Medicare Other | Admitting: Family Medicine

## 2017-10-02 ENCOUNTER — Ambulatory Visit (AMBULATORY_SURGERY_CENTER): Payer: Medicare Other | Admitting: Internal Medicine

## 2017-10-02 ENCOUNTER — Encounter: Payer: Self-pay | Admitting: Internal Medicine

## 2017-10-02 VITALS — BP 112/41 | HR 65 | Temp 98.8°F | Resp 10 | Ht 63.0 in | Wt 101.0 lb

## 2017-10-02 DIAGNOSIS — K639 Disease of intestine, unspecified: Secondary | ICD-10-CM

## 2017-10-02 DIAGNOSIS — D509 Iron deficiency anemia, unspecified: Secondary | ICD-10-CM

## 2017-10-02 DIAGNOSIS — Z8719 Personal history of other diseases of the digestive system: Secondary | ICD-10-CM

## 2017-10-02 NOTE — Progress Notes (Signed)
Report to PACU, RN, vss, BBS= Clear.  

## 2017-10-02 NOTE — Progress Notes (Signed)
Vocal order from Dr. Hilarie Fredrickson -  pt to resume your PLAVIX today.  Pt and her husband are aware.  No problems noted in the recovery room. maw

## 2017-10-02 NOTE — Op Note (Signed)
[facility] Patient Name: Anne [name] Procedure Date: 10/02/2017 3:35 PM MRN: [id number] Endoscopist: Jerene Bears , MD Age: 67 Referring MD:  Date of Birth: 1950-12-17 Gender: Female Account #: [id number] Procedure:                Upper GI endoscopy Indications:              Iron deficiency anemia, history of gastritis and                            history of achalasia Medicines:                Monitored Anesthesia Care Procedure:                Pre-Anesthesia Assessment:                           - Prior to the procedure, a History and Physical                            was performed, and patient medications and                            allergies were reviewed. The patient's tolerance of                            previous anesthesia was also reviewed. The risks                            and benefits of the procedure and the sedation                            options and risks were discussed with the patient.                            All questions were answered, and informed consent                            was obtained. Prior Anticoagulants: The patient has                            taken Plavix (clopidogrel), last dose was 5 days                            prior to procedure. ASA Grade Assessment: III - A                            patient with severe systemic disease. After                            reviewing the risks and benefits, the patient was                            deemed in satisfactory condition to undergo the  procedure.                           After obtaining informed consent, the endoscope was                            passed under direct vision. Throughout the                            procedure, the patient's blood pressure, pulse, and                            oxygen saturations were monitored continuously. The                            Endoscope was introduced through the mouth, and      advanced to the second part of duodenum. The upper                            GI endoscopy was accomplished without difficulty.                            The patient tolerated the procedure well. Scope In: Scope Out: Findings:                 The examined esophagus was normal. The GE junction                            is rather patulous (not expected with history of                            achalasia).                           The entire examined stomach was normal.                           The examined duodenum was normal. Biopsies for                            histology were taken with a cold forceps for                            evaluation of celiac disease. Complications:            No immediate complications. Estimated Blood Loss:     Estimated blood loss was minimal. Impression:               - Normal esophagus.                           - Normal stomach.                           - Normal examined duodenum. Biopsied. Recommendation:           - Patient has a contact number available for  emergencies. The signs and symptoms of potential                            delayed complications were discussed with the                            patient. Return to normal activities tomorrow.                            Written discharge instructions were provided to the                            patient.                           - Resume previous diet.                           - Continue present medications.                           - Await pathology results.                           - See the other procedure note for documentation of                            additional recommendations. Jerene Bears, MD 10/02/2017 4:28:06 PM This report has been signed electronically.

## 2017-10-02 NOTE — Progress Notes (Signed)
Called to room to assist during endoscopic procedure.  Patient ID and intended procedure confirmed with present staff. Received instructions for my participation in the procedure from the performing physician.  

## 2017-10-02 NOTE — Patient Instructions (Addendum)
YOU HAD AN ENDOSCOPIC PROCEDURE TODAY AT [facility]:   Refer to the procedure report that was given to you for any specific questions about what was found during the examination.  If the procedure report does not answer your questions, please call your gastroenterologist to clarify.  If you requested that your care partner not be given the details of your procedure findings, then the procedure report has been included in a sealed envelope for you to review at your convenience later.  YOU SHOULD EXPECT: Some feelings of bloating in the abdomen. Passage of more gas than usual.  Walking can help get rid of the air that was put into your GI tract during the procedure and reduce the bloating. If you had a lower endoscopy (such as a colonoscopy or flexible sigmoidoscopy) you may notice spotting of blood in your stool or on the toilet paper. If you underwent a bowel prep for your procedure, you may not have a normal bowel movement for a few days.  Please Note:  You might notice some irritation and congestion in your nose or some drainage.  This is from the oxygen used during your procedure.  There is no need for concern and it should clear up in a day or so.  SYMPTOMS TO REPORT IMMEDIATELY:   Following lower endoscopy (colonoscopy or flexible sigmoidoscopy):  Excessive amounts of blood in the stool  Significant tenderness or worsening of abdominal pains  Swelling of the abdomen that is new, acute  Fever of 100F or higher   Following upper endoscopy (EGD)  Vomiting of blood or coffee ground material  New chest pain or pain under the shoulder blades  Painful or persistently difficult swallowing  New shortness of breath  Fever of 100F or higher  Black, tarry-looking stools  For urgent or emergent issues, a gastroenterologist can be reached at any hour by calling [phone].   DIET:  We do recommend a small meal at first, but then you may proceed to your regular diet.  Drink  plenty of fluids but you should avoid alcoholic beverages for 24 hours.  ACTIVITY:  You should plan to take it easy for the rest of today and you should NOT DRIVE or use heavy machinery until tomorrow (because of the sedation medicines used during the test).    FOLLOW UP: Our staff will call the number listed on your records the next business day following your procedure to check on you and address any questions or concerns that you may have regarding the information given to you following your procedure. If we do not reach you, we will leave a message.  However, if you are feeling well and you are not experiencing any problems, there is no need to return our call.  We will assume that you have returned to your regular daily activities without incident.  If any biopsies were taken you will be contacted by phone or by letter within the next 1-3 weeks.  Please call us at [phone] if you have not heard about the biopsies in 3 weeks.    SIGNATURES/CONFIDENTIALITY: You and/or your care partner have signed paperwork which will be entered into your electronic medical record.  These signatures attest to the fact that that the information above on your After Visit Summary has been reviewed and is understood.  Full responsibility of the confidentiality of this discharge information lies with you and/or your care-partner.     Continue iron replacement, if persistent iron deficiency video  capsule endoscopy can be considered to examine the remaining small bowel. Resume PLAVIX today per Dr. Hilarie Fredrickson. You may resume your current medications today. Await biopsy results. Repeat colonoscopy in 10 years for screening purposes. Please call if any questions or concerns.

## 2017-10-02 NOTE — Op Note (Signed)
[facility] Patient Name: Anne [name] Procedure Date: 10/02/2017 3:35 PM MRN: [id number] Endoscopist: Jerene Bears , MD Age: 68 Referring MD:  Date of Birth: [birth year] Gender: Female Account #: [id number] Procedure:                Colonoscopy Indications:              Iron deficiency anemia Medicines:                Monitored Anesthesia Care Procedure:                Pre-Anesthesia Assessment:                           - Prior to the procedure, a History and Physical                            was performed, and patient medications and                            allergies were reviewed. The patient's tolerance of                            previous anesthesia was also reviewed. The risks                            and benefits of the procedure and the sedation                            options and risks were discussed with the patient.                            All questions were answered, and informed consent                            was obtained. Prior Anticoagulants: The patient has                            taken Plavix (clopidogrel), last dose was 5 days                            prior to procedure. ASA Grade Assessment: III - A                            patient with severe systemic disease. After                            reviewing the risks and benefits, the patient was                            deemed in satisfactory condition to undergo the                            procedure.  After obtaining informed consent, the colonoscope                            was passed under direct vision. Throughout the                            procedure, the patient's blood pressure, pulse, and                            oxygen saturations were monitored continuously. The                            Endoscope was introduced through the anus and                            advanced to the cecum, identified by appendiceal   orifice and ileocecal valve. The colonoscopy was                            performed without difficulty. The patient tolerated                            the procedure well. The quality of the bowel                            preparation was good after copious irrigation and                            lavage. The ileocecal valve, appendiceal orifice,                            and rectum were photographed. Scope In: 3:55:29 PM Scope Out: 4:19:39 PM Scope Withdrawal Time: 0 hours 17 minutes 50 seconds  Total Procedure Duration: 0 hours 24 minutes 10 seconds  Findings:                 The digital rectal exam was normal.                           The entire examined colon appeared normal on direct                            and retroflexion views. Complications:            No immediate complications. Estimated Blood Loss:     Estimated blood loss: none. Impression:               - The entire examined colon is normal on direct and                            retroflexion views.                           - No specimens collected. Recommendation:           - Patient has a contact number available for  emergencies. The signs and symptoms of potential                            delayed complications were discussed with the                            patient. Return to normal activities tomorrow.                            Written discharge instructions were provided to the                            patient.                           - Resume previous diet.                           - Continue present medications.                           - Repeat colonoscopy in 10 years for screening                            purposes.                           - Continue iron replacement, if persistent iron                            deficiency video capsule endoscopy can be                            considered to examine the remaining small bowel. Jerene Bears, MD 10/02/2017  4:32:42 PM This report has been signed electronically.

## 2017-10-03 ENCOUNTER — Telehealth: Payer: Self-pay | Admitting: *Deleted

## 2017-10-03 NOTE — Telephone Encounter (Signed)
  Follow up Call-  Call back number 10/02/2017  Post procedure Call Back phone  # [phone]  [phone]  Permission to leave phone message Yes  Some recent data might be hidden     Patient questions:  Do you have a fever, pain , or abdominal swelling? No. Pain Score  0 *  Have you tolerated food without any problems? Yes.    Have you been able to return to your normal activities? Yes.    Do you have any questions about your discharge instructions: Diet   No. Medications  No. Follow up visit  No.  Do you have questions or concerns about your Care? No.  Actions: * If pain score is 4 or above: No action needed, pain <4.

## 2017-10-04 ENCOUNTER — Ambulatory Visit: Payer: Medicare Other

## 2017-10-05 ENCOUNTER — Encounter: Payer: Self-pay | Admitting: Internal Medicine

## 2017-10-08 ENCOUNTER — Telehealth: Payer: Self-pay | Admitting: Internal Medicine

## 2017-10-08 NOTE — Telephone Encounter (Signed)
Patient states since procedure on 7.9.19 she has not had a BM and is starting to have abd pain again. Pt would like advice.

## 2017-10-08 NOTE — Telephone Encounter (Signed)
Pt states she has not had a BM since Tuesday afternoon. States she is taking a dose of miralax daily and this had not helped, just caused a lot of gas. Discussed with pt that she could try the linzess that she has on hand. Pt verbalized understanding.

## 2017-10-09 ENCOUNTER — Ambulatory Visit (INDEPENDENT_AMBULATORY_CARE_PROVIDER_SITE_OTHER): Payer: Medicare Other | Admitting: Cardiovascular Disease

## 2017-10-09 ENCOUNTER — Ambulatory Visit: Payer: Medicare Other

## 2017-10-09 ENCOUNTER — Ambulatory Visit (INDEPENDENT_AMBULATORY_CARE_PROVIDER_SITE_OTHER): Payer: Medicare Other

## 2017-10-09 ENCOUNTER — Encounter: Payer: Self-pay | Admitting: Cardiovascular Disease

## 2017-10-09 VITALS — BP 119/66 | HR 71 | Ht 63.5 in | Wt 102.5 lb

## 2017-10-09 VITALS — BP 108/62 | HR 74 | Temp 98.0°F | Resp 14 | Ht 63.0 in | Wt 103.8 lb

## 2017-10-09 DIAGNOSIS — Z0000 Encounter for general adult medical examination without abnormal findings: Secondary | ICD-10-CM | POA: Diagnosis not present

## 2017-10-09 DIAGNOSIS — I872 Venous insufficiency (chronic) (peripheral): Secondary | ICD-10-CM | POA: Diagnosis not present

## 2017-10-09 DIAGNOSIS — E538 Deficiency of other specified B group vitamins: Secondary | ICD-10-CM

## 2017-10-09 DIAGNOSIS — R002 Palpitations: Secondary | ICD-10-CM | POA: Diagnosis not present

## 2017-10-09 MED ORDER — CYANOCOBALAMIN 1000 MCG/ML IJ SOLN: 1000.0000 ug | Freq: Once | INTRAMUSCULAR | Status: DC

## 2017-10-09 NOTE — Patient Instructions (Signed)
Medication Instructions: Your physician recommends that you continue on your current medications as directed. Please refer to the Current Medication list given to you today.  If you need a refill on your cardiac medications before your next appointment, please call your pharmacy.    Procedures/Testing: Your physician has recommended that you wear a ZIO monitor for 3 days.  Event monitors are medical devices that record the heart's electrical activity. Doctors most often [place] these monitors to diagnose arrhythmias. Arrhythmias are problems with the speed or rhythm of the heartbeat. The monitor is a small, portable device. You can wear one while you do your normal daily activities. This is usually used to diagnose what is causing palpitations/syncope (passing out).   Follow-Up: Your physician wants you to follow-up as needed with Dr. Fletcher Anon.   Thank you for choosing [facility] at [facility]!

## 2017-10-09 NOTE — Patient Instructions (Addendum)
  Anne [name] , Thank you for taking time to come for your Medicare Wellness Visit. I appreciate your ongoing commitment to your health goals. Please review the following plan we discussed and let me know if I can assist you in the future.   These are the goals we discussed: Goals    . Stay hydrated       This is a list of the screening recommended for you and due dates:  Health Maintenance  Topic Date Due  . Tetanus Vaccine  09/06/1968  . Flu Shot  10/25/2017  . Pneumonia vaccines (2 of 2 - PPSV23) 10/28/2018  . Mammogram  04/19/2019  . Colon Cancer Screening  10/03/2027  . DEXA scan (bone density measurement)  Completed  .  Hepatitis C: One time screening is recommended by Center for Disease Control  (CDC) for  adults born from [birth year] through 1966.   Completed

## 2017-10-09 NOTE — Progress Notes (Signed)
Subjective:   Anne [name] is a 67 y.o. female who presents for an Initial Medicare Annual Wellness Visit.  Review of Systems    No ROS.  Medicare Wellness Visit. Additional risk factors are reflected in the social history.  Cardiac Risk Factors include: advanced age (>62men, >58 women)     Objective:    Today's Vitals   10/09/17 0955 10/09/17 0956  BP: 108/62   Pulse: 74   Resp: 14   Temp: 98 F (36.7 C)   TempSrc: Oral   SpO2: 99%   Weight: 103 lb 12.8 oz (47.1 kg)   Height: 5\' 3"  (1.6 m)   PainSc:  8    Body mass index is 18.39 kg/m.  Advanced Directives 10/09/2017 04/23/2017 02/09/2017 01/10/2017 01/04/2017 10/11/2016 08/08/2016  Does Patient Have a Medical Advance Directive? Yes Yes Yes Yes Yes Yes Yes  Type of Advance Directive Living will [place];Living will Healthcare Power of [place];Living will [place];Living will [place];Living will [place];Living will  Does patient want to make changes to medical advance directive? No - Patient declined No - Patient declined No - Patient declined No - Patient declined No - Patient declined No - Patient declined No - Patient declined  Copy of Healthcare Power of Attorney in Chart? - - No - copy requested No - copy requested No - copy requested No - copy requested No - copy requested  Would patient like information on creating a medical advance directive? - - - - - - -    Current Medications (verified) Outpatient Encounter Medications as of 10/09/2017  Medication Sig  . acetaminophen (TYLENOL) 500 MG tablet Take 500 mg by mouth daily as needed for moderate pain or headache.  . albuterol (PROVENTIL HFA;VENTOLIN HFA) 108 (90 Base) MCG/ACT inhaler Inhale 2 puffs into the lungs every 6 (six) hours as needed for wheezing or shortness of breath.  . ALPRAZolam (XANAX) 0.25 MG tablet TAKE 1/2 TABLET BY MOUTH TWICE DAILY AS  NEEDED  . cetirizine (ZYRTEC) 10 MG tablet TAKE ONE TABLET EVERY DAY  . chlorpheniramine-HYDROcodone (TUSSIONEX PENNKINETIC ER) 10-8 MG/5ML SUER Take 5 mLs by mouth at bedtime as needed for cough.  . clopidogrel (PLAVIX) 75 MG tablet TAKE ONE TABLET BY MOUTH EVERY MORNING  . EPINEPHrine (EPIPEN 2-PAK) 0.3 mg/0.3 mL IJ SOAJ injection Inject 0.3 mLs (0.3 mg total) into the muscle once. (Patient taking differently: Inject 0.3 mg once as needed into the muscle (FOR AN ALLERGIC REACTION). )  . estradiol (ESTRACE) 1 MG tablet TAKE ONE TABLET BY MOUTH EVERY DAY  . ferrous sulfate 325 (65 FE) MG tablet Take 1 tablet (325 mg total) by mouth daily with breakfast.  . fluticasone (FLONASE) 50 MCG/ACT nasal spray PLACE 2 SPRAYS INTO BOTH NOSTRILS DAILY. (Patient taking differently: Place 2 sprays into both nostrils daily as needed for allergies. )  . furosemide (LASIX) 20 MG tablet TAKE ONE TABLET EVERY DAY AS NEEDED  . hydroxypropyl methylcellulose (ISOPTO TEARS) 2.5 % ophthalmic solution Place 1 drop into both eyes 3 (three) times daily as needed for dry eyes.  [name] ipratropium-albuterol (DUONEB) 0.5-2.5 (3) MG/3ML SOLN Take 3 mLs by nebulization every 4 (four) hours as needed. Dx 496  . meloxicam (MOBIC) 15 MG tablet Take 15 mg by mouth daily as needed.   . montelukast (SINGULAIR) 10 MG tablet TAKE ONE TABLET BY MOUTH AT BEDTIME  . mupirocin ointment (BACTROBAN) 2 %  Apply 1 application daily as needed topically (FOR [facility] SITE IRRITATION).   [name] pantoprazole (PROTONIX) 40 MG tablet Take 1 tablet (40 mg total) by mouth daily.  . potassium chloride 20 MEQ/15ML (10%) SOLN Take 15 mLs (20 mEq total) by mouth daily as needed (cramps). (Patient taking differently: Take 20 mEq daily as needed by mouth (for cramping). )  . Probiotic Product (PROBIOTIC PO) Take 1 capsule daily as needed by mouth (FOR G.I. DISTRESS).   . sodium chloride (OCEAN) 0.65 % SOLN nasal spray Place 1 spray into both nostrils every 4 (four) hours as  needed for congestion.   [name] tiZANidine (ZANAFLEX) 2 MG tablet Take 2 mg by mouth every 6 (six) hours as needed for muscle spasms.   Facility-Administered Encounter Medications as of 10/09/2017  Medication  . cyanocobalamin ((VITAMIN B-12)) injection 1,000 mcg  . ipratropium-albuterol (DUONEB) 0.5-2.5 (3) MG/3ML nebulizer solution 3 mL    Allergies (verified) Asa [aspirin]; Bee venom; Eggs or egg-derived products; Influenza vaccines; Shellfish allergy; Quinolones; Ciprofloxacin; Clarithromycin; Erythromycin; Levaquin [levofloxacin in d5w]; Peanut-containing drug products; Septra [sulfamethoxazole-trimethoprim]; Telithromycin; Zanaflex [tizanidine]; Adhesive [tape]; and Epinephrine   History: Past Medical History:  Diagnosis Date  . Allergic rhinitis   . Allergy    multiple, mostly aspirin, levaquin and shellfish.  . Anemia   . Asthma   . Cataract 2014   exraction with len implant  . Complication of anesthesia    Breathing problems upon waking up. 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  . Compressed cervical disc   . COPD (chronic obstructive pulmonary disease) ([place])   . CVA (cerebral infarction)   . Dyspnea   . Esophageal dysmotility   . Heart murmur    as child  . History of hiatal hernia   . IBS (irritable bowel syndrome)   . Migraine   . PICC (peripherally inserted central catheter) removal 02/20/2017  . PONV (postoperative nausea and vomiting)   . Problems with swallowing    intermittently  . Pulmonary fibrosis ([place])   . Shingles   . Stroke [facility])    slurred speech, drawn face, imaging normal, occurred twice, [facility]-"TIA" if antything" 02/20/17-no residual effects  . Tracheostomy in place [facility])   . Vocal cord paresis    Past Surgical History:  Procedure Laterality Date  . ABDOMINAL HYSTERECTOMY    . APPLICATION OF A-CELL OF HEAD/NECK N/A 02/21/2017   Procedure: APPLICATION OF A-CELL OF HEAD/NECK;  Surgeon: Wallace Going, DO;  Location:  [facility];  Service: Plastics;  Laterality: N/A;  . BOTOX INJECTION N/A 07/29/2013   Procedure: BOTOX INJECTION;  Surgeon: Jerene Bears, MD;  [facility]: [facility];  Service: Gastroenterology;  Laterality: N/A;  . BOTOX INJECTION N/A 05/04/2015   Procedure: BOTOX INJECTION;  Surgeon: Jerene Bears, MD;  [facility];  Service: Gastroenterology;  Laterality: N/A;  . BREAST BIOPSY Left    neg  . BREAST SURGERY Left 2002   bx of skin  . CHOLECYSTECTOMY    . COLONOSCOPY    . ESOPHAGEAL MANOMETRY N/A 12/16/2012   Procedure: ESOPHAGEAL MANOMETRY (EM);  Surgeon: Jerene Bears, MD;  [facility];  Service: Gastroenterology;  Laterality: N/A;  . ESOPHAGOGASTRODUODENOSCOPY (EGD) WITH PROPOFOL N/A 07/29/2013   Procedure: ESOPHAGOGASTRODUODENOSCOPY (EGD) WITH PROPOFOL;  Surgeon: Jerene Bears, MD;  [facility];  Service: Gastroenterology;  Laterality: N/A;  with botox injection  . ESOPHAGOGASTRODUODENOSCOPY (EGD) WITH PROPOFOL N/A 05/04/2015   Procedure: ESOPHAGOGASTRODUODENOSCOPY (EGD) WITH PROPOFOL;  Surgeon: Ulice Dash  Everitt Amber, MD;  Location[facility];  Service: Gastroenterology;  Laterality: N/A;  . EYE SURGERY     Catarct surgery 2014  . Eye Surgery AS Child Left   . INCISION AND DRAINAGE OF WOUND N/A 02/21/2017   Procedure: IRRIGATION AND DEBRIDEMENT WOUND NECK;  Surgeon: Wallace Going, DO;  Location: [facility];  Service: Plastics;  Laterality: N/A;  . JEJUNOSTOMY FEEDING TUBE     x2 both failed. no longer has  . MULTIPLE TOOTH EXTRACTIONS     2 teeth removed  . POSTERIOR CERVICAL FUSION/FORAMINOTOMY N/A 01/10/2017   Procedure: LAMINECTOMY AND FORAMINOTOMY CERVICAL FOUR-CERVICAL FIVE, CERVICAL FIVE-SIX POSTERIOR CERVICAL INSTRUMENT FUSION CERVICAL THREE-CERVICAL SEVEN,CERVICAL LAMINECTOMY CERVICAL THREE-CERVICAL SEVEN.;  Surgeon: Eustace Moore, MD;  Location: [facility];  Service: Neurosurgery;  Laterality: N/A;  posterior  . TRACHEOSTOMY  1996   done at [facility], Dr. Kathyrn Sheriff  .  TUBAL LIGATION    . VIDEO BRONCHOSCOPY Bilateral 11/20/2012   Procedure: VIDEO BRONCHOSCOPY WITH FLUORO;  Surgeon: Juanito Doom, MD;  [facility];  Service: Cardiopulmonary;  Laterality: Bilateral;   Family History  Problem Relation Age of Onset  . Asthma Cousin   . COPD Cousin   . Breast cancer Maternal Grandmother 60  . Cancer Maternal Grandmother        breast  . Asthma Father   . Cancer Father        lung  . Kidney cancer Father   . Arrhythmia Father   . Cancer Paternal Uncle        lung  . COPD Paternal Grandfather   . Breast cancer Maternal Aunt 66  . Cancer Maternal Aunt        breast  . Bladder Cancer Neg Hx    Social History   Socioeconomic History  . Marital status: Married    Spouse name: Not on file  . Number of children: 4  . Years of education: Not on file  . Highest education level: Not on file  Occupational History  . Not on file  Social Needs  . Financial resource strain: Not hard at all  . Food insecurity:    Worry: Never true    Inability: Never true  . Transportation needs:    Medical: No    Non-medical: No  Tobacco Use  . Smoking status: Never Smoker  . Smokeless tobacco: Never Used  Substance and Sexual Activity  . Alcohol use: Yes    Alcohol/week: 0.0 oz    Comment: occ.  . Drug use: No  . Sexual activity: Not on file  Lifestyle  . Physical activity:    Days per week: 3 days    Minutes per session: 60 min  . Stress: Not on file  Relationships  . Social connections:    Talks on phone: Not on file    Gets together: Not on file    Attends religious service: Not on file    Active member of club or organization: Not on file    Attends meetings of clubs or organizations: Not on file    Relationship status: Not on file  Other Topics Concern  . Not on file  Social History Narrative   Lives in [place] with husband.  She has four children.   Retired from [employer] given:  Not Answered   Clinical Intake:  Pre-visit preparation completed: Yes  Pain : 0-10 Pain Score: 8  Pain Type: Chronic pain Pain Location: Back Pain Orientation: Mid Pain Radiating Towards: L side ABD Pain Onset: More than a month ago Pain Frequency: Intermittent Pain Relieving Factors: medication; rest Effect of Pain on Daily Activities: she paces herself  Pain Relieving Factors: medication; rest  Nutritional Status: BMI <19  Underweight Diabetes: No  How often do you need to have someone help you when you read instructions, pamphlets, or other written materials from your doctor or pharmacy?: 1 - Never  Interpreter Needed?: No      Activities of Daily Living In your present state of health, do you have any difficulty performing the following activities: 10/09/2017 02/21/2017  Hearing? Y Y  Comment - slight hearing loss  Vision? N N  Difficulty concentrating or making decisions? N N  Walking or climbing stairs? Y N  Comment Asthma. She paces herself.  -  Dressing or bathing? N N  Doing errands, shopping? N -  Preparing Food and eating ? N -  Using the Toilet? N -  In the past six months, have you accidently leaked urine? N -  Do you have problems with loss of bowel control? N -  Managing your Medications? N -  Managing your Finances? N -  Housekeeping or managing your Housekeeping? N -  Some recent data might be hidden     Immunizations and Health Maintenance Immunization History  Administered Date(s) Administered  . Influenza, Quadrivalent, Recombinant, Inj, Pf 02/06/2017  . Influenza,inj,Quad PF,6+ Mos 12/19/2012  . Influenza,trivalent, recombinat, inj, PF 01/05/2014  . Pneumococcal Conjugate-13 01/23/2014  . Pneumococcal Polysaccharide-23 10/27/2013   Health Maintenance Due  Topic Date Due  . Samul Dada  09/06/1969    Patient Care Team: [name], MD as PCP - General (Family Medicine)  Indicate any recent Medical Services you may have  received from other than [facility] providers in the past year (date may be approximate).     Assessment:   This is a routine wellness examination for Sashia.The goal of the wellness visit is to assist the patient how to close the gaps in care and create a preventative care plan for the patient.   The roster of all physicians providing medical care to patient is listed in the Snapshot section of the chart.  Osteoporosis risk reviewed.    Safety issues reviewed; Smoke and carbon monoxide detectors in the home. No firearms in the home. Wears seatbelts when driving or riding with others. No violence in the home.  They do not have excessive sun exposure.  Discussed the need for sun protection: hats, long sleeves and the use of sunscreen if there is significant sun exposure.  Patient is alert, normal appearance, oriented to person/place/and time. Correctly identified the president of the [place] and recalls of 3/3 words.Performs simple calculations and can read correct time from watch face. Displays appropriate judgement.  No new identified risk were noted.  No failures at ADL's or IADL's.    BMI- discussed the importance of a healthy diet, water intake and the benefits of aerobic exercise. Educational material provided.   24 hour diet recall: Regular diet  Dental- UTD.  TDAP vaccine discussed.   B12 injection administered L deltoid, tolerated well.  Monthly injections scheduled per pcp order.   Patient Concerns: None at this time. Follow up with PCP as needed.  Hearing/Vision screen Hearing Screening Comments: Followed by [facility] (Dr. Lavena Bullion) Hearing aid, bilateral Vision Screening Comments: Followed by [facility] Wears corrective lenses Annual visits  Dietary issues and exercise activities discussed: Current Exercise Habits: Home exercise routine, Type of exercise: strength training/weights;walking(Dancercise), Time (Minutes): 60, Frequency (Times/Week): 3,  Weekly Exercise (Minutes/Week): 180, Intensity: Mild  Goals    . Stay hydrated      Depression Screen PHQ 2/9 Scores 10/09/2017 08/01/2017 06/06/2017 04/23/2017 12/13/2016 10/28/2015 11/20/2014  PHQ - 2 Score 0 0 0 0 0 0 0  PHQ- 9 Score - 3 2 4  - - -    Fall Risk Fall Risk  10/09/2017 04/23/2017 12/13/2016 10/28/2015 11/20/2014  Falls in the past year? No No No No Yes  Number falls in past yr: - - - - 1  Injury with Fall? - - - - No   Cognitive Function: MMSE - Mini Mental State Exam 10/09/2017  Orientation to time 5  Orientation to Place 5  Registration 3  Attention/ Calculation 5  Recall 3  Language- name 2 objects 2  Language- repeat 1  Language- follow 3 step command 3  Language- read & follow direction 1  Write a sentence 1  Copy design 1  Total score 30        Screening Tests Health Maintenance  Topic Date Due  . TETANUS/TDAP  09/06/1969  . INFLUENZA VACCINE  10/25/2017  . PNA vac Low Risk Adult (2 of 2 - PPSV23) 10/28/2018  . MAMMOGRAM  04/19/2019  . COLONOSCOPY  10/03/2027  . DEXA SCAN  Completed  . Hepatitis C Screening  Completed     Plan:    End of life planning; Advance aging; Advanced directives discussed. Copy of current HCPOA/Living Will requested.    I have personally reviewed and noted the following in the patient's chart:   . Medical and social history . Use of alcohol, tobacco or illicit drugs  . Current medications and supplements . Functional ability and status . Nutritional status . Physical activity . Advanced directives . List of other physicians . Hospitalizations, surgeries, and ER visits in previous 12 months . Vitals . Screenings to include cognitive, depression, and falls . Referrals and appointments  In addition, I have reviewed and discussed with patient certain preventive protocols, quality metrics, and best practice recommendations. A written personalized care plan for preventive services as well as general preventive health  recommendations were provided to patient.     Varney Biles, LPN   [id number]

## 2017-10-09 NOTE — Progress Notes (Signed)
Cardiology Office Note   Date:  10/09/2017   ID:  Anne [name], DOB 27-Nov-1950, MRN [id number]  PCP:  [name], MD  Cardiologist:   Kathlyn Sacramento, MD   Chief Complaint  Patient presents with  . other    Heart palpitations and edema feet. Meds reviewed verbally with pt.      History of Present Illness: Anne [name] is a 67 y.o. female who was referred by Dr. [name] for evaluation of palpitations.  She was seen by me in 2015 for evaluation of atypical chest pain.  She underwent a nuclear stress test that showed no evidence of ischemia.  Echocardiogram showed normal LV systolic function and no significant valvular abnormalities.  She is not a smoker and there is no family history of coronary artery disease. She did have possible TIAs in the past and has been on Plavix for that reason. She also has chronic nutritional problems requiring tube feeding in the past.    She had recent leg edema bilaterally after dental surgery.  This lasted for about 4 to 5 days and has improved significantly since then without intervention.  She does take furosemide as needed for that.  She has no orthopnea, shortness of breath or PND.  No recent chest pain.  She does complain of palpitations described as frequent skipping of her heart.  No dizziness, syncope or presyncope.  Past Medical History:  Diagnosis Date  . Allergic rhinitis   . Allergy    multiple, mostly aspirin, levaquin and shellfish.  . Anemia   . Asthma   . Cataract 2014   exraction with len implant  . Complication of anesthesia    Breathing problems upon waking up. 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  . Compressed cervical disc   . COPD (chronic obstructive pulmonary disease) ([place])   . CVA (cerebral infarction)   . Dyspnea   . Esophageal dysmotility   . Heart murmur    as child  . History of hiatal hernia   . IBS (irritable bowel syndrome)   . Migraine   . PICC (peripherally  inserted central catheter) removal 02/20/2017  . PONV (postoperative nausea and vomiting)   . Problems with swallowing    intermittently  . Pulmonary fibrosis ([place])   . Shingles   . Stroke [facility]    slurred speech, drawn face, imaging normal, occurred twice, [facility]-"TIA" if antything" 02/20/17-no residual effects  . Tracheostomy in place [facility])   . Vocal cord paresis     Past Surgical History:  Procedure Laterality Date  . ABDOMINAL HYSTERECTOMY    . APPLICATION OF A-CELL OF HEAD/NECK N/A 02/21/2017   Procedure: APPLICATION OF A-CELL OF HEAD/NECK;  Surgeon: Wallace Going, DO;  Location: [place];  Service: Plastics;  Laterality: N/A;  . BOTOX INJECTION N/A 07/29/2013   Procedure: BOTOX INJECTION;  Surgeon: Jerene Bears, MD;  [facility];  Service: Gastroenterology;  Laterality: N/A;  . BOTOX INJECTION N/A 05/04/2015   Procedure: BOTOX INJECTION;  Surgeon: Jerene Bears, MD;  [facility];  Service: Gastroenterology;  Laterality: N/A;  . BREAST BIOPSY Left    neg  . BREAST SURGERY Left 2002   bx of skin  . CHOLECYSTECTOMY    . COLONOSCOPY    . ESOPHAGEAL MANOMETRY N/A 12/16/2012   Procedure: ESOPHAGEAL MANOMETRY (EM);  Surgeon: Jerene Bears, MD;  [facility];  Service: Gastroenterology;  Laterality: N/A;  . ESOPHAGOGASTRODUODENOSCOPY (EGD) WITH PROPOFOL  N/A 07/29/2013   Procedure: ESOPHAGOGASTRODUODENOSCOPY (EGD) WITH PROPOFOL;  Surgeon: Jerene Bears, MD;  [facility];  Service: Gastroenterology;  Laterality: N/A;  with botox injection  . ESOPHAGOGASTRODUODENOSCOPY (EGD) WITH PROPOFOL N/A 05/04/2015   Procedure: ESOPHAGOGASTRODUODENOSCOPY (EGD) WITH PROPOFOL;  Surgeon: Jerene Bears, MD;  [facility];  Service: Gastroenterology;  Laterality: N/A;  . EYE SURGERY     Catarct surgery 2014  . Eye Surgery AS Child Left   . INCISION AND DRAINAGE OF WOUND N/A 02/21/2017   Procedure: IRRIGATION AND DEBRIDEMENT WOUND NECK;  Surgeon: Wallace Going, DO;  Location: [place];  Service: Plastics;  Laterality: N/A;  . JEJUNOSTOMY FEEDING TUBE     x2 both failed. no longer has  . MULTIPLE TOOTH EXTRACTIONS     2 teeth removed  . POSTERIOR CERVICAL FUSION/FORAMINOTOMY N/A 01/10/2017   Procedure: LAMINECTOMY AND FORAMINOTOMY CERVICAL FOUR-CERVICAL FIVE, CERVICAL FIVE-SIX POSTERIOR CERVICAL INSTRUMENT FUSION CERVICAL THREE-CERVICAL SEVEN,CERVICAL LAMINECTOMY CERVICAL THREE-CERVICAL SEVEN.;  Surgeon: Eustace Moore, MD;  Location: [place];  Service: Neurosurgery;  Laterality: N/A;  posterior  . TRACHEOSTOMY  1996   done at [facility], Dr. Kathyrn Sheriff  . TUBAL LIGATION    . VIDEO BRONCHOSCOPY Bilateral 11/20/2012   Procedure: VIDEO BRONCHOSCOPY WITH FLUORO;  Surgeon: Juanito Doom, MD;  [facility];  Service: Cardiopulmonary;  Laterality: Bilateral;     Current Outpatient Medications  Medication Sig Dispense Refill  . acetaminophen (TYLENOL) 500 MG tablet Take 500 mg by mouth daily as needed for moderate pain or headache.    . albuterol (PROVENTIL HFA;VENTOLIN HFA) 108 (90 Base) MCG/ACT inhaler Inhale 2 puffs into the lungs every 6 (six) hours as needed for wheezing or shortness of breath. 1 Inhaler 5  . ALPRAZolam (XANAX) 0.25 MG tablet TAKE 1/2 TABLET BY MOUTH TWICE DAILY AS NEEDED 30 tablet 0  . cetirizine (ZYRTEC) 10 MG tablet TAKE ONE TABLET EVERY DAY 30 tablet 5  . chlorpheniramine-HYDROcodone (TUSSIONEX PENNKINETIC ER) 10-8 MG/5ML SUER Take 5 mLs by mouth at bedtime as needed for cough. 50 mL 0  . clopidogrel (PLAVIX) 75 MG tablet TAKE ONE TABLET BY MOUTH EVERY MORNING 90 tablet 1  . EPINEPHrine (EPIPEN 2-PAK) 0.3 mg/0.3 mL IJ SOAJ injection Inject 0.3 mLs (0.3 mg total) into the muscle once. (Patient taking differently: Inject 0.3 mg once as needed into the muscle (FOR AN ALLERGIC REACTION). ) 1 Device 0  . estradiol (ESTRACE) 1 MG tablet TAKE ONE TABLET BY MOUTH EVERY DAY 90 tablet 1  . ferrous sulfate 325 (65 FE) MG tablet Take  1 tablet (325 mg total) by mouth daily with breakfast. 30 tablet 2  . fluticasone (FLONASE) 50 MCG/ACT nasal spray PLACE 2 SPRAYS INTO BOTH NOSTRILS DAILY. (Patient taking differently: Place 2 sprays into both nostrils daily as needed for allergies. ) 16 g 2  . furosemide (LASIX) 20 MG tablet TAKE ONE TABLET EVERY DAY AS NEEDED 90 tablet 1  . hydroxypropyl methylcellulose (ISOPTO TEARS) 2.5 % ophthalmic solution Place 1 drop into both eyes 3 (three) times daily as needed for dry eyes.    [name] ipratropium-albuterol (DUONEB) 0.5-2.5 (3) MG/3ML SOLN Take 3 mLs by nebulization every 4 (four) hours as needed. Dx 496 120 mL 2  . meloxicam (MOBIC) 15 MG tablet Take 15 mg by mouth daily as needed.     . montelukast (SINGULAIR) 10 MG tablet TAKE ONE TABLET BY MOUTH AT BEDTIME 90 tablet 1  . mupirocin ointment (BACTROBAN) 2 % Apply  1 application daily as needed topically (FOR [facility] SITE IRRITATION).     [name] pantoprazole (PROTONIX) 40 MG tablet Take 1 tablet (40 mg total) by mouth daily. 90 tablet 0  . potassium chloride 20 MEQ/15ML (10%) SOLN Take 15 mLs (20 mEq total) by mouth daily as needed (cramps). (Patient taking differently: Take 20 mEq daily as needed by mouth (for cramping). ) 240 mL 0  . Probiotic Product (PROBIOTIC PO) Take 1 capsule daily as needed by mouth (FOR G.I. DISTRESS).     . sodium chloride (OCEAN) 0.65 % SOLN nasal spray Place 1 spray into both nostrils every 4 (four) hours as needed for congestion.     [name] tiZANidine (ZANAFLEX) 2 MG tablet Take 2 mg by mouth every 6 (six) hours as needed for muscle spasms.     Current Facility-Administered Medications  Medication Dose Route Frequency Provider Last Rate Last Dose  . cyanocobalamin ((VITAMIN B-12)) injection 1,000 mcg  1,000 mcg Intramuscular Once [name], MD      . ipratropium-albuterol (DUONEB) 0.5-2.5 (3) MG/3ML nebulizer solution 3 mL  3 mL Nebulization Q6H Burnard Hawthorne, FNP   3 mL at 05/18/17 1437    Allergies:   Asa  [aspirin]; Bee venom; Eggs or egg-derived products; Influenza vaccines; Shellfish allergy; Quinolones; Ciprofloxacin; Clarithromycin; Erythromycin; Levaquin [levofloxacin in d5w]; Peanut-containing drug products; Septra [sulfamethoxazole-trimethoprim]; Telithromycin; Zanaflex [tizanidine]; Adhesive [tape]; and Epinephrine    Social History:  The patient  reports that she has never smoked. She has never used smokeless tobacco. She reports that she drinks alcohol. She reports that she does not use drugs.   Family History:  The patient's family history includes Arrhythmia in her father; Asthma in her cousin and father; Breast cancer (age of onset: 25) in her maternal aunt; Breast cancer (age of onset: 15) in her maternal grandmother; COPD in her cousin and paternal grandfather; Cancer in her father, maternal aunt, maternal grandmother, and paternal uncle; Kidney cancer in her father.    ROS:  Please see the history of present illness.   Otherwise, review of systems are positive for none.   All other systems are reviewed and negative.    PHYSICAL EXAM: VS:  BP 119/66 (BP Location: Right Arm, Patient Position: Sitting, Cuff Size: Normal)   Pulse 71   Ht 5' 3.5" (1.613 m)   Wt 102 lb 8 oz (46.5 kg)   BMI 17.87 kg/m  , BMI Body mass index is 17.87 kg/m. GEN: Well nourished, well developed, in no acute distress  HEENT: normal  Neck: no JVD, carotid bruits, or masses Cardiac: RRR; no murmurs, rubs, or gallops,no edema  Respiratory:  clear to auscultation bilaterally, normal work of breathing GI: soft, nontender, nondistended, + BS MS: no deformity or atrophy  Skin: warm and dry, no rash Neuro:  Strength and sensation are intact Psych: euthymic mood, full affect   EKG:  EKG is ordered today. The ekg ordered today demonstrates normal sinus rhythm with no significant ST or T wave changes.   Recent Labs: 06/22/2017: ALT 14; BUN 15; Creatinine, Ser 0.93; Potassium 3.7; Pro B Natriuretic peptide  (BNP) 76.0; Sodium 140 08/03/2017: Hemoglobin 10.2; Platelets 208.0; TSH 0.44    Lipid Panel    Component Value Date/Time   CHOL 215 (H) 12/13/2016 1045   TRIG 140.0 12/13/2016 1045   HDL 122.30 12/13/2016 1045   CHOLHDL 2 12/13/2016 1045   VLDL 28.0 12/13/2016 1045   LDLCALC 64 12/13/2016 1045   LDLDIRECT 92.7 12/13/2012 1037  Wt Readings from Last 3 Encounters:  10/09/17 102 lb 8 oz (46.5 kg)  10/09/17 103 lb 12.8 oz (47.1 kg)  10/02/17 101 lb (45.8 kg)        PAD Screen 10/09/2017  Previous PAD dx? No  Previous surgical procedure? No  Pain with walking? Yes  Subsides with rest? No  Feet/toe relief with dangling? No  Painful, non-healing ulcers? No  Extremities discolored? Yes      ASSESSMENT AND PLAN:  1.  Palpitation: Likely due to premature beats with no recent evaluation.  Labs in May were overall unremarkable including thyroid function.  She does have anemia. I requested a 3-day outpatient monitor.  2.  Chronic venous insufficiency: This is a likely cause for her leg edema.  I advised her to elevate her legs during the day as possible and also she can consider using knee-high support stockings.    Disposition:   FU with me as needed.   Signed,  Kathlyn Sacramento, MD  10/09/2017 2:09 PM    [place]

## 2017-10-10 ENCOUNTER — Ambulatory Visit (INDEPENDENT_AMBULATORY_CARE_PROVIDER_SITE_OTHER): Payer: Medicare Other | Admitting: Pulmonary Disease

## 2017-10-10 ENCOUNTER — Encounter: Payer: Self-pay | Admitting: Pulmonary Disease

## 2017-10-10 VITALS — BP 108/66 | HR 72 | Resp 16 | Ht 63.5 in | Wt 102.0 lb

## 2017-10-10 DIAGNOSIS — R0600 Dyspnea, unspecified: Secondary | ICD-10-CM

## 2017-10-10 DIAGNOSIS — J8410 Pulmonary fibrosis, unspecified: Secondary | ICD-10-CM | POA: Diagnosis not present

## 2017-10-10 DIAGNOSIS — Z930 Tracheostomy status: Secondary | ICD-10-CM | POA: Diagnosis not present

## 2017-10-10 DIAGNOSIS — J4530 Mild persistent asthma, uncomplicated: Secondary | ICD-10-CM | POA: Insufficient documentation

## 2017-10-10 DIAGNOSIS — R0609 Other forms of dyspnea: Secondary | ICD-10-CM

## 2017-10-10 NOTE — Patient Instructions (Signed)
Continue Singulair daily Continue albuterol inhaler as needed Follow-up in 6 months with chest x-ray prior to that visit

## 2017-10-10 NOTE — Progress Notes (Signed)
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 No frank honeycombing. Findings have progressed since  2014. 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  INTERVAL HISTORY: Last visit 03/2017. Treated with Zpak and short course of prednisone for asthmatic bronchitis in 05/2017. Participated in pulm rehab program  SUBJ: This is a scheduled follow up.  New complaints.  She believes that her exercise tolerance has improved.  She is exercising on a regular basis, walking on treadmill 3 times per week.  She remains on Singulair daily.  She uses albuterol 2-3 times per week.  She uses her nebulized bronchodilators less than 2-3 times per month.  Denies CP, fever, purulent sputum, hemoptysis, LE edema and calf tenderness.   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

## 2017-10-12 ENCOUNTER — Telehealth: Payer: Self-pay | Admitting: Internal Medicine

## 2017-10-12 DIAGNOSIS — R002 Palpitations: Secondary | ICD-10-CM

## 2017-10-12 DIAGNOSIS — D509 Iron deficiency anemia, unspecified: Secondary | ICD-10-CM

## 2017-10-12 NOTE — Telephone Encounter (Signed)
Pt aware and knows to expect a call from PCP office for labs.

## 2017-10-12 NOTE — Telephone Encounter (Signed)
I have placed orders. Forwarding to my staff to get her scheduled for a lab appointment.

## 2017-10-12 NOTE — Telephone Encounter (Signed)
Pt states that Dr. Hilarie Fredrickson advised her to keep checking her iron level as they are low. She wants to know who should monitor that? Her PCP or Dr. Hilarie Fredrickson.

## 2017-10-12 NOTE — Telephone Encounter (Signed)
Dr. Hilarie Fredrickson please see note below and advise.

## 2017-10-12 NOTE — Telephone Encounter (Signed)
Likely easier for her based on where she lives Thanks I would repeat CBC, IBC panel and ferritin If still low, either you or I can order IV iron Thanks

## 2017-10-12 NOTE — Telephone Encounter (Signed)
Whichever is easier for the patient She can have ferritin and IBC panel followed with me or PCP. She has been on oral iron now for about 2 months and it is reasonable to check the iron studies now. If she prefers here then okay to order If iron not improving with oral supplementation then will order IV iron infusions

## 2017-10-12 NOTE — Telephone Encounter (Signed)
LMTCB. Need to schedule pt a non fasting lab appt. Labs have been ordered. PEC may schedule pt lab appt.

## 2017-10-12 NOTE — Telephone Encounter (Signed)
I am happy to arrange for them in our office if she would prefer that. Thank you for keeping me up to date.

## 2017-10-15 ENCOUNTER — Other Ambulatory Visit (INDEPENDENT_AMBULATORY_CARE_PROVIDER_SITE_OTHER): Payer: Medicare Other

## 2017-10-15 DIAGNOSIS — D509 Iron deficiency anemia, unspecified: Secondary | ICD-10-CM | POA: Diagnosis not present

## 2017-10-15 LAB — IBC PANEL
Iron: 31 ug/dL — ABNORMAL LOW (ref 42–145)
Saturation Ratios: 6.2 % — ABNORMAL LOW (ref 20.0–50.0)
Transferrin: 359 mg/dL (ref 212.0–360.0)

## 2017-10-15 LAB — CBC
HCT: 33.7 % — ABNORMAL LOW (ref 36.0–46.0)
Hemoglobin: 11.1 g/dL — ABNORMAL LOW (ref 12.0–15.0)
MCHC: 33 g/dL (ref 30.0–36.0)
MCV: 87.8 fl (ref 78.0–100.0)
Platelets: 262 10*3/uL (ref 150.0–400.0)
RBC: 3.84 Mil/uL — ABNORMAL LOW (ref 3.87–5.11)
RDW: 15.5 % (ref 11.5–15.5)
WBC: 5.6 10*3/uL (ref 4.0–10.5)

## 2017-10-15 LAB — FERRITIN: Ferritin: 7.2 ng/mL — ABNORMAL LOW (ref 10.0–291.0)

## 2017-10-16 NOTE — Telephone Encounter (Signed)
Patient had these done yesterday

## 2017-10-17 ENCOUNTER — Other Ambulatory Visit: Payer: Self-pay | Admitting: Family Medicine

## 2017-10-17 ENCOUNTER — Other Ambulatory Visit: Payer: Self-pay | Admitting: Physician Assistant

## 2017-10-17 NOTE — Telephone Encounter (Signed)
Drug database reviewed.  Sent to pharmacy. 

## 2017-10-17 NOTE — Telephone Encounter (Signed)
Last OV 08/03/17 last filled 08/24/17 30 0rf

## 2017-10-18 ENCOUNTER — Other Ambulatory Visit: Payer: Self-pay | Admitting: Family Medicine

## 2017-10-18 NOTE — Telephone Encounter (Signed)
See below and advise

## 2017-10-18 NOTE — Telephone Encounter (Signed)
Pt is calling asking if she needs to have iron infusions based on lab results from 10/15/17 . Best call back # [phone].

## 2017-10-19 NOTE — Telephone Encounter (Signed)
Would refill Iron, Dr. Hilarie Fredrickson has not reviewed labs yet.

## 2017-10-19 NOTE — Telephone Encounter (Signed)
Vaughan Basta could you please advise.  Pt requesting refill on Iron - read chart regarding Iron infusion. Wasn't sure if this should be refilled or not . Thanks [employer]

## 2017-10-22 ENCOUNTER — Telehealth: Payer: Self-pay | Admitting: *Deleted

## 2017-10-22 NOTE — Telephone Encounter (Signed)
Patient made aware of results and verbalized understanding. She stated that she would be interested in starting a beta blocker. She feels like the palpitations last longer than they were and she is experiencing dizzy spells at times when these palpitations occur.

## 2017-10-22 NOTE — Telephone Encounter (Signed)
-----   Message from Wellington Hampshire, MD sent at 10/19/2017  5:51 PM EDT ----- Inform patient that outpatient monitor was overall unremarkable and showed only one brief episode of SVT that lasted only 6 beats.  Overall, no significant arrhythmia and no need to treat.  If palpitations worsen, a small dose beta-blocker can be considered.

## 2017-10-22 NOTE — Telephone Encounter (Signed)
Ok to arrange Feraheme x 2 separated by 7 days for IDA Check CBC, ferritin, IBC 12 weeks after IV iron

## 2017-10-24 ENCOUNTER — Other Ambulatory Visit: Payer: Self-pay

## 2017-10-24 ENCOUNTER — Telehealth: Payer: Self-pay | Admitting: Family Medicine

## 2017-10-24 DIAGNOSIS — D509 Iron deficiency anemia, unspecified: Secondary | ICD-10-CM

## 2017-10-24 NOTE — Telephone Encounter (Signed)
Spoke with pt and she is aware. Pt scheduled for feraheme 10/25/17@11am , pt aware of appt, orders in epic. Pt will need labs at the end of October. Orders in epic.

## 2017-10-24 NOTE — Telephone Encounter (Signed)
Copied from [place] [id number]. Topic: Quick Communication - See Telephone Encounter >> Oct 24, 2017  2:33 PM Mylinda Latina, NT wrote: CRM for notification. See Telephone encounter for: 10/24/17. Patient called and states that she seen a Dr. [name] to Dr. Germain Osgood about an iron infusion . She states that she has not heard anything else about that. She is requesting a call back .CB# [phone]

## 2017-10-24 NOTE — Addendum Note (Signed)
Addended by: Rosanne Sack R on: 10/24/2017 04:01 PM   Modules accepted: Orders

## 2017-10-24 NOTE — Telephone Encounter (Signed)
Please advise 

## 2017-10-25 ENCOUNTER — Ambulatory Visit (HOSPITAL_COMMUNITY)
Admission: RE | Admit: 2017-10-25 | Discharge: 2017-10-25 | Disposition: A | Discharge status: Home / Self Care | Payer: Medicare Other | Source: Ambulatory Visit | Attending: Internal Medicine | Admitting: Internal Medicine

## 2017-10-25 DIAGNOSIS — D509 Iron deficiency anemia, unspecified: Secondary | ICD-10-CM

## 2017-10-25 MED ORDER — METOPROLOL SUCCINATE ER 25 MG PO TB24: 25.0000 mg | ORAL_TABLET | Freq: Every day | ORAL | 3 refills | Status: DC

## 2017-10-25 MED ORDER — SODIUM CHLORIDE 0.9 % IV SOLN
510.0000 mg | INTRAVENOUS | Status: DC
  Administered 2017-10-25: 510 mg via INTRAVENOUS
  Filled 2017-10-25: qty 17

## 2017-10-25 MED ORDER — SODIUM CHLORIDE 0.9 % IV SOLN
INTRAVENOUS | Status: DC
  Administered 2017-10-25: 12:00:00 via INTRAVENOUS

## 2017-10-25 NOTE — Telephone Encounter (Signed)
S/w patient. She verbalized understanding to start toprol xl 25 mg daily. Rx sent to pharmacy.

## 2017-10-25 NOTE — Telephone Encounter (Signed)
Left message for pt to call.

## 2017-10-25 NOTE — Progress Notes (Signed)
Pt received IV infusion of feraheme per MD order; tolerated well with no complications noted; pt discharged home; discharge instructions explained, given, and signed;  Ordering Provider: Zenovia Jarred MD Associated Diagnosis: Iron deficiency anemia, unspecified iron deficiency anemia type (D50.9)

## 2017-10-25 NOTE — Telephone Encounter (Signed)
Start Toprol XL 25 mg once daily.

## 2017-10-25 NOTE — Telephone Encounter (Signed)
Patient returning call.

## 2017-10-25 NOTE — Telephone Encounter (Signed)
Patient states she received a call yesterday and this is already scheduled for today.

## 2017-10-25 NOTE — Discharge Instructions (Signed)

## 2017-10-27 ENCOUNTER — Other Ambulatory Visit: Payer: Self-pay | Admitting: Internal Medicine

## 2017-10-27 ENCOUNTER — Other Ambulatory Visit: Payer: Self-pay | Admitting: Family Medicine

## 2017-10-29 ENCOUNTER — Encounter: Payer: Self-pay | Admitting: Family Medicine

## 2017-10-29 ENCOUNTER — Other Ambulatory Visit: Payer: Self-pay | Admitting: Family Medicine

## 2017-10-29 ENCOUNTER — Telehealth: Payer: Self-pay | Admitting: Cardiovascular Disease

## 2017-10-29 DIAGNOSIS — E876 Hypokalemia: Secondary | ICD-10-CM

## 2017-10-29 MED ORDER — POTASSIUM CHLORIDE 20 MEQ/15ML (10%) PO SOLN: 20.0000 meq | Freq: Every day | ORAL | 1 refills | Status: DC | PRN

## 2017-10-29 NOTE — Telephone Encounter (Signed)
S/w patient. She has not started the Toprol yet. When she went to the pharmacy to pick it up, she asked the pharmacist about any precautions she should be aware of. Pharmacist suggested she may want to start with 1/2 tablet since patient's BP run sort of low. Patient states usually her BP is around 110/60. Recently, it was 101/51 and 97/43 after getting iron infusions. She currently has low iron. She wanted to know if Dr Fletcher Anon would be ok with her starting with 1/2 dose daily. Advised she could do so and to monitor her BP/HR about 2-3 hours after taking to see what her BP is and I will make Dr Fletcher Anon aware and see if any further recommendations.

## 2017-10-29 NOTE — Telephone Encounter (Signed)
Half the dose is fine.

## 2017-10-29 NOTE — Telephone Encounter (Signed)
Pt states she has started Toprol and her BP readings has been 101/41 when she left iron infusion on 8/1 it was 97/43. Please call to discuss.

## 2017-10-30 MED ORDER — METOPROLOL SUCCINATE ER 25 MG PO TB24: 12.5000 mg | ORAL_TABLET | Freq: Every day | ORAL | 3 refills | Status: DC

## 2017-10-30 NOTE — Telephone Encounter (Signed)
Patient verbalized understanding that ok to take metoprolol 12.5 mg (1/2 tablet) by mouth once a day. She was very Patent attorney. Med list updated.

## 2017-10-31 ENCOUNTER — Telehealth: Payer: Self-pay

## 2017-10-31 ENCOUNTER — Ambulatory Visit (HOSPITAL_COMMUNITY)
Admission: RE | Admit: 2017-10-31 | Discharge: 2017-10-31 | Disposition: A | Discharge status: Home / Self Care | Payer: Medicare Other | Source: Ambulatory Visit | Attending: Internal Medicine | Admitting: Internal Medicine

## 2017-10-31 DIAGNOSIS — D509 Iron deficiency anemia, unspecified: Secondary | ICD-10-CM | POA: Diagnosis not present

## 2017-10-31 MED ORDER — FAMOTIDINE 40 MG/5ML PO SUSR: 40.0000 mg | Freq: Once | ORAL | Status: DC

## 2017-10-31 MED ORDER — DIPHENHYDRAMINE HCL 50 MG/ML IJ SOLN
INTRAMUSCULAR | Status: AC
  Filled 2017-10-31: qty 1

## 2017-10-31 MED ORDER — DIPHENHYDRAMINE HCL 50 MG/ML IJ SOLN
50.0000 mg | Freq: Once | INTRAMUSCULAR | Status: AC
  Administered 2017-10-31: 50 mg via INTRAVENOUS

## 2017-10-31 MED ORDER — PREDNISONE 20 MG PO TABS: ORAL_TABLET | ORAL | 0 refills | Status: DC

## 2017-10-31 MED ORDER — FAMOTIDINE 40 MG PO TABS: 40.0000 mg | ORAL_TABLET | Freq: Two times a day (BID) | ORAL | 0 refills | Status: DC

## 2017-10-31 MED ORDER — SODIUM CHLORIDE 0.9 % IV SOLN
INTRAVENOUS | Status: DC
  Administered 2017-10-31: 11:00:00 via INTRAVENOUS

## 2017-10-31 MED ORDER — METHYLPREDNISOLONE SODIUM SUCC 125 MG IJ SOLR
80.0000 mg | Freq: Once | INTRAMUSCULAR | Status: AC
  Administered 2017-10-31: 80 mg via INTRAVENOUS

## 2017-10-31 MED ORDER — RANITIDINE HCL 150 MG/10ML PO SYRP
150.0000 mg | ORAL_SOLUTION | Freq: Once | ORAL | Status: AC
  Administered 2017-10-31: 150 mg via ORAL
  Filled 2017-10-31: qty 10

## 2017-10-31 MED ORDER — METHYLPREDNISOLONE SODIUM SUCC 125 MG IJ SOLR
INTRAMUSCULAR | Status: AC
  Filled 2017-10-31: qty 2

## 2017-10-31 MED ORDER — FERUMOXYTOL INJECTION 510 MG/17 ML
510.0000 mg | INTRAVENOUS | Status: DC
  Administered 2017-10-31: 510 mg via INTRAVENOUS
  Filled 2017-10-31: qty 17

## 2017-10-31 NOTE — Addendum Note (Signed)
Addended by: Rosanne Sack R on: 10/31/2017 12:01 PM   Modules accepted: Orders

## 2017-10-31 NOTE — Progress Notes (Signed)
Spoke with patient and spouse; pt states that she does not want to go to the ED for evaluation; states that dizziness is subsiding slowly after receiving IV benadryl; Dr. Vena Rua office notified; spoke with Vaughan Basta, RN; received new orders, per PA, Amy Esterwood; will continue to [employer]

## 2017-10-31 NOTE — Telephone Encounter (Signed)
Received call back from Pt care center and pt is now c/o feeling as if she is going to pass out. Per Nicoletta Ba PA pt needs to be evaluated in the ER. Bobbie aware in pt care center.

## 2017-10-31 NOTE — Progress Notes (Signed)
Pt received IV infusion of feraheme per MD order; infusion stopped before completion, pt coughing and stated that throat was swelling; Dr. Vena Rua office notified and new orders were received; pt given discharge instructions related to new home medication orders received from physician's office in relation to reaction today; instructions explained to patient and spouse; both verbalized understanding; pt discharged home;   Ordering Provider: Zenovia Jarred MD Ordering Provider: Nicoletta Ba, PA  Associated Diagnosis: Iron deficiency anemia, unspecified iron deficiency anemia type (D50.9)

## 2017-10-31 NOTE — Progress Notes (Signed)
Pt arrived for IV infusion of feraheme; infusion started and pt received 81.24 ml then began to cough; pt stated that some itching in throat area; infusion stopped; pt used personal inhaler; MD office notified; received order for one time IV dose of 50 mg benadryl; will continue to monitor

## 2017-10-31 NOTE — Telephone Encounter (Signed)
Pt refused to go to the ER. Reports she feels flushed. Order given per Nicoletta Ba PA for solumedrol 80mg  IV, pepcid 40mg  IV or po. Pt to take pepcid 40mg  po bid for 2 days and prednisone 40mg  tomorrow am, 30mg  Friday am and 20mg  Saturday am. Script sent to Pharmacy and Jolayne Haines to notify pt.

## 2017-10-31 NOTE — Telephone Encounter (Signed)
Received call from Pt care center stating pt was almost finished with her Feraheme and she c/o feeling her "throat squeezing a little." States she had this sensation last week when she got home and took benadryl and it got better. Per Nicoletta Ba PA please give pt Benadryl 50mg  IV and then take benadryl 25-50mg  po in about 6 hours once she is home. Order given to [employer] Feraheme listed on allergy list.

## 2017-10-31 NOTE — Progress Notes (Signed)
Pt states that she feels like she will pass out; pt alert and oriented; O2 sat 100% on room air; HR 65; pt oriented; MD office notified; spoke with Vaughan Basta, RN, pt to be sent to ED for evaluation; will continue to monitor

## 2017-10-31 NOTE — Discharge Instructions (Signed)
TODAY, You received an IV infusion of feraheme.  The infusion was stopped due to suspected allergic reaction. New orders received from Dr. Vena Rua office. Take Prednisone 40 mg in am, 30 mg on Friday, and 20 mg on Saturday. Take Pepcid 40 mg by mouth 2 times per day for the next 2 days, Thursday and Friday. Please contact Dr. Vena Rua office for any follow-up instructions.      Ferumoxytol injection What is this medicine? FERUMOXYTOL is an iron complex. Iron is used to make healthy red blood cells, which carry oxygen and nutrients throughout the body. This medicine is used to treat iron deficiency anemia in people with chronic kidney disease. This medicine may be used for other purposes; ask your health care provider or pharmacist if you have questions. COMMON BRAND NAME(S): Feraheme What should I tell my health care provider before I take this medicine? They need to know if you have any of these conditions: -anemia not caused by low iron levels -high levels of iron in the blood -magnetic resonance imaging (MRI) test scheduled -an unusual or allergic reaction to iron, other medicines, foods, dyes, or preservatives -pregnant or trying to get pregnant -breast-feeding How should I use this medicine? This medicine is for injection into a vein. It is given by a health care professional in a hospital or clinic setting. Talk to your pediatrician regarding the use of this medicine in children. Special care may be needed. Overdosage: If you think you have taken too much of this medicine contact a poison control center or emergency room at once. NOTE: This medicine is only for you. Do not share this medicine with others. What if I miss a dose? It is important not to miss your dose. Call your doctor or health care professional if you are unable to keep an appointment. What may interact with this medicine? This medicine may interact with the following medications: -other iron products This list may  not describe all possible interactions. Give your health care provider a list of all the medicines, herbs, non-prescription drugs, or dietary supplements you use. Also tell them if you smoke, drink alcohol, or use illegal drugs. Some items may interact with your medicine. What should I watch for while using this medicine? Visit your doctor or healthcare professional regularly. Tell your doctor or healthcare professional if your symptoms do not start to get better or if they get worse. You may need blood work done while you are taking this medicine. You may need to follow a special diet. Talk to your doctor. Foods that contain iron include: whole grains/cereals, dried fruits, beans, or peas, leafy green vegetables, and organ meats (liver, kidney). What side effects may I notice from receiving this medicine? Side effects that you should report to your doctor or health care professional as soon as possible: -allergic reactions like skin rash, itching or hives, swelling of the face, lips, or tongue -breathing problems -changes in blood pressure -feeling faint or lightheaded, falls -fever or chills -flushing, sweating, or hot feelings -swelling of the ankles or feet Side effects that usually do not require medical attention (report to your doctor or health care professional if they continue or are bothersome): -diarrhea -headache -nausea, vomiting -stomach pain This list may not describe all possible side effects. Call your doctor for medical advice about side effects. You may report side effects to FDA at 1-800-FDA-1088. Where should I keep my medicine? This drug is given in a hospital or clinic and will not be stored at  home. NOTE: This sheet is a summary. It may not cover all possible information. If you have questions about this medicine, talk to your doctor, pharmacist, or health care provider.  2018 Elsevier/Gold Standard (2015-04-15 12:41:49)

## 2017-11-01 ENCOUNTER — Encounter (HOSPITAL_COMMUNITY): Payer: Medicare Other

## 2017-11-01 ENCOUNTER — Other Ambulatory Visit (INDEPENDENT_AMBULATORY_CARE_PROVIDER_SITE_OTHER): Payer: Medicare Other

## 2017-11-01 ENCOUNTER — Telehealth: Payer: Self-pay | Admitting: Internal Medicine

## 2017-11-01 DIAGNOSIS — E876 Hypokalemia: Secondary | ICD-10-CM

## 2017-11-01 LAB — POTASSIUM: Potassium: 3.7 mEq/L (ref 3.5–5.1)

## 2017-11-01 NOTE — Telephone Encounter (Signed)
Pt had a reaction to the Iron Infusion yesterday and she was scheduled for Oral Surgery.  Her Surgeon does not feel comfortable doing the surgery until Dr. Hilarie Fredrickson releases.  She needs a note stating when she can have the surgery Fax# [phone]   Attn: Dr. Mikki Harbor ([facility] in [place])  [phone]

## 2017-11-01 NOTE — Telephone Encounter (Signed)
Dr Hilarie Fredrickson, Her oral surgeon is requesting written clearance for her dental implant surgery after her adverse reaction to the [facility] on 10/31/17.  She is aware that you are out of the office until next week so she decided to cancel the surgery at this time and wait for the clearance from you.

## 2017-11-04 NOTE — Telephone Encounter (Signed)
I am sorry to hear that she had an allergic reaction to IV iron In the future if IV iron is needed, there are other formulations available that can be used if needed.  Regarding her oral surgery, from my perspective if she is feeling 100% back to her normal, then she is okay for oral surgery.  It should be noted that she is on Plavix and permission to hold this medication for oral surgery is deferred to the prescribing provider for Plavix.

## 2017-11-05 ENCOUNTER — Telehealth: Payer: Self-pay

## 2017-11-05 NOTE — Telephone Encounter (Signed)
See 11/05/17 note from Debbora Lacrosse, RN regarding this information.

## 2017-11-05 NOTE — Telephone Encounter (Signed)
I faxed Dr Garth Schlatter note to her oral surgeon.

## 2017-11-06 ENCOUNTER — Telehealth: Payer: Self-pay

## 2017-11-06 NOTE — Telephone Encounter (Signed)
Patient notified her potassium is normal

## 2017-11-06 NOTE — Telephone Encounter (Signed)
Copied from [place] [id number]. Topic: General - Other >> Nov 06, 2017  1:12 PM Yvette Rack wrote: Reason for CRM: Pt states she had labs done on 11/01/17 and she would like the lab results. Pt requests call back. Cb# [phone]

## 2017-11-06 NOTE — Telephone Encounter (Signed)
I let the patient know that I mailed the letter to her oral surgeon.  She reports that she has discussed the Plavix with Dr  Romilda Garret.

## 2017-11-15 ENCOUNTER — Other Ambulatory Visit: Payer: Self-pay | Admitting: Family Medicine

## 2017-11-15 NOTE — Telephone Encounter (Signed)
Last OV 08/03/17 last filled 10/17/17 30 1rf

## 2017-11-16 NOTE — Telephone Encounter (Signed)
Controlled substance database reviewed. Sent to pharmacy.   

## 2017-11-30 ENCOUNTER — Other Ambulatory Visit: Payer: Self-pay

## 2017-11-30 ENCOUNTER — Telehealth: Payer: Self-pay | Admitting: Internal Medicine

## 2017-11-30 DIAGNOSIS — K220 Achalasia of cardia: Secondary | ICD-10-CM

## 2017-11-30 DIAGNOSIS — D509 Iron deficiency anemia, unspecified: Secondary | ICD-10-CM

## 2017-11-30 NOTE — Telephone Encounter (Signed)
Referral in epic for Heme appt. Pt scheduled for EGD with botox at [facility] 12/25/17@8 :30am, pt to arrive there at 7am. Pt to be NPO after midnight. Left message for pt to call back.  Pt cannot do 12/25/17 or 01/22/18. Pt would like to be called back with next available hospital appt.

## 2017-11-30 NOTE — Telephone Encounter (Signed)
At time of last contact patient requested repeat upper endoscopy for Botox to the LES.  This definitively helped her swallowing.  She had met with Dr. Silverio Decamp does not wish to pursue pneumatic dilation at this time  Please schedule patient for upper endoscopy at [facility] for Botox to the LES for known hypertensive LES with achalasia variant We will need to contact her prescribing provider to hold her Plavix 5 days before this procedure

## 2017-11-30 NOTE — Telephone Encounter (Signed)
Pt states she spoke to Dr. Norman Herrlich a few weeks ago regarding heme referral and botox appt but she has not heard anything regarding appt. Please advise.

## 2017-11-30 NOTE — Telephone Encounter (Signed)
Pt also asked about heme referral?

## 2017-11-30 NOTE — Telephone Encounter (Signed)
Yes heme in [place], Dr. Mike Gip, persistent IDA, infusion reaction to [facility]

## 2017-12-11 ENCOUNTER — Encounter: Payer: Self-pay | Admitting: Hematology and Oncology

## 2017-12-11 ENCOUNTER — Inpatient Hospital Stay: Payer: Medicare Other | Attending: Hematology and Oncology | Admitting: Hematology and Oncology

## 2017-12-11 ENCOUNTER — Other Ambulatory Visit: Payer: Self-pay

## 2017-12-11 ENCOUNTER — Telehealth: Payer: Self-pay | Admitting: Internal Medicine

## 2017-12-11 ENCOUNTER — Telehealth: Payer: Self-pay | Admitting: *Deleted

## 2017-12-11 ENCOUNTER — Inpatient Hospital Stay: Payer: Medicare Other

## 2017-12-11 VITALS — BP 102/57 | HR 65 | Temp 97.8°F | Resp 16 | Wt 106.0 lb

## 2017-12-11 DIAGNOSIS — N289 Disorder of kidney and ureter, unspecified: Secondary | ICD-10-CM | POA: Insufficient documentation

## 2017-12-11 DIAGNOSIS — Z801 Family history of malignant neoplasm of trachea, bronchus and lung: Secondary | ICD-10-CM | POA: Diagnosis not present

## 2017-12-11 DIAGNOSIS — Z8052 Family history of malignant neoplasm of bladder: Secondary | ICD-10-CM | POA: Diagnosis not present

## 2017-12-11 DIAGNOSIS — D509 Iron deficiency anemia, unspecified: Secondary | ICD-10-CM | POA: Insufficient documentation

## 2017-12-11 DIAGNOSIS — Z803 Family history of malignant neoplasm of breast: Secondary | ICD-10-CM | POA: Diagnosis not present

## 2017-12-11 DIAGNOSIS — Z8051 Family history of malignant neoplasm of kidney: Secondary | ICD-10-CM | POA: Diagnosis not present

## 2017-12-11 DIAGNOSIS — E538 Deficiency of other specified B group vitamins: Secondary | ICD-10-CM | POA: Diagnosis not present

## 2017-12-11 LAB — URINALYSIS, COMPLETE (UACMP) WITH MICROSCOPIC
Bacteria, UA: NONE SEEN
Bilirubin Urine: NEGATIVE
Glucose, UA: NEGATIVE mg/dL
Hgb urine dipstick: NEGATIVE
Ketones, ur: NEGATIVE mg/dL
Leukocytes, UA: NEGATIVE
Nitrite: NEGATIVE
Protein, ur: NEGATIVE mg/dL
Specific Gravity, Urine: 1.005 (ref 1.005–1.030)
pH: 6 (ref 5.0–8.0)

## 2017-12-11 LAB — IRON AND TIBC
Iron: 93 ug/dL (ref 28–170)
Saturation Ratios: 30 % (ref 10.4–31.8)
TIBC: 315 ug/dL (ref 250–450)
UIBC: 222 ug/dL

## 2017-12-11 LAB — CBC WITH DIFFERENTIAL/PLATELET
Basophils Absolute: 0.1 10*3/uL (ref 0–0.1)
Basophils Relative: 1 %
Eosinophils Absolute: 0.4 10*3/uL (ref 0–0.7)
Eosinophils Relative: 8 %
HCT: 37.9 % (ref 35.0–47.0)
Hemoglobin: 12.6 g/dL (ref 12.0–16.0)
Lymphocytes Relative: 27 %
Lymphs Abs: 1.4 10*3/uL (ref 1.0–3.6)
MCH: 31.2 pg (ref 26.0–34.0)
MCHC: 33.3 g/dL (ref 32.0–36.0)
MCV: 93.7 fL (ref 80.0–100.0)
Monocytes Absolute: 0.5 10*3/uL (ref 0.2–0.9)
Monocytes Relative: 10 %
Neutro Abs: 2.8 10*3/uL (ref 1.4–6.5)
Neutrophils Relative %: 54 %
Platelets: 208 10*3/uL (ref 150–440)
RBC: 4.05 MIL/uL (ref 3.80–5.20)
RDW: 18.1 % — ABNORMAL HIGH (ref 11.5–14.5)
WBC: 5.2 10*3/uL (ref 3.6–11.0)

## 2017-12-11 LAB — FERRITIN: Ferritin: 255 ng/mL (ref 11–307)

## 2017-12-11 NOTE — Telephone Encounter (Signed)
Copied from [place] [phone]. Topic: Appointment Scheduling - Scheduling Inquiry for Clinic >> Dec 11, 2017 11:57 AM Lennox Solders wrote: Reason for CRM: pt had last b12 injection on 10-09-17. Pt would like another one. I can not sch without crm

## 2017-12-11 NOTE — Telephone Encounter (Signed)
Appt cancelled and will wait on November schedule to come out.

## 2017-12-11 NOTE — Progress Notes (Signed)
[facility] day:  12/11/2017  Chief Complaint: Anne [name] is a 67 y.o. female with iron deficiency who is referred in consultation by Dr. Zenovia Jarred for assessment and management.  HPI:  Patient notes that she has known about iron deficiency "for several months". She notes that her iron stores were around 8. She was started on oral iron, which she used for about a month.   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  CBC was normal on 01/04/2017 with a hematocrit 39.4, hemoglobin 13.0, and MCV 93.8.  Anemia was first noted on 02/08/2017.  CBC on 10/15/2017 revealed a hematocrit of 33.7, hemoglobin 11.1, MCV 87.8, platelets 262,000, and WBC 5600.  Ferrritin was 7.2.  Iron saturation was 6.2%.  Ferritin has been followed:  30.2 on 09/03/2012, 8 on 08/09/2017, and 7.2 on 10/15/2017.  The patient received Feraheme 510 mg IV on 10/25/2017 and 10/31/2017.  She states that she tolerated her first infusion well.  With her second infusion, she developed an itchy throat.  The infusion was stopped about "3/4ths the way through".  She received Benadryl and Solumedrol.   MRI abdomen on 03/29/2017 revealed a stable to minimal increase in size of the 1.2 cm enhancing lesion arising from the upper pole of left kidney which is suspicious for a small renal neoplasm.  Upper endoscopy on 10/02/2017 by Dr. Hilarie Fredrickson revealed a normal stomach and duodenum.  The GE junction was patulous.  Random duodenal biopsies revealed no evidence of sprue, active inflammation or other disorders.  Colonoscopy on 10/02/2017 was normal.  She has a history of vitamin B12 deficiency.  B12 was 621 on 04/28/2014. Patient is on monthly parenteral B12 supplementation with Dr. [name].   Symptomatically, patient remains fatigued, but notes that her energy has improved overall. She has periods  where she becomes "really cold". Patient denies fevers, sweats, and weight loss. She has not experienced and changes to her bowel habits. She has an IBS diagnosis. Patient denies bleeding; no hematochezia, melena, or gross hematuria.   She has chronic shortness of breath related to her asthma diagnosis.   She has chronic swallowing issues.  She describes her diet as "limited". She does not maintain an iron rich diet. Patient eats very little meat. Weight today is 106 lb (48.1 kg). Patient has no ice pica. She complains of frequent restless leg symptoms. She denies pain in the clinic today.   Patient has a maternal grandmother (age in 71s) and maternal aunt (age 37) who had breast cancer. Father and paternal uncle had lung cancer.  Paternal grandfather had prostate cancer.  Paternal grandmother had pernicious anemia.   Past Medical History:  Diagnosis Date  . Allergic rhinitis   . Allergy    multiple, mostly aspirin, levaquin and shellfish.  . Anemia   . Asthma   . Cataract 2014   exraction with len implant  . Complication of anesthesia    Breathing problems upon waking up. 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  . Compressed cervical disc   . COPD (chronic obstructive pulmonary disease) ([place])   . CVA (cerebral infarction)   . Dyspnea   . Esophageal dysmotility   . Heart murmur    as child  . History of hiatal hernia   . IBS (irritable bowel syndrome)   . Migraine   . PICC (peripherally inserted central catheter) removal 02/20/2017  .  PONV (postoperative nausea and vomiting)   . Problems with swallowing    intermittently  . Pulmonary fibrosis ([place])   . Shingles   . Stroke [facility])    slurred speech, drawn face, imaging normal, occurred twice, [facility]-"TIA" if antything" 02/20/17-no residual effects  . Tracheostomy in place [facility])   . Vocal cord paresis     Past Surgical History:  Procedure Laterality Date  . ABDOMINAL HYSTERECTOMY    . APPLICATION OF  A-CELL OF HEAD/NECK N/A 02/21/2017   Procedure: APPLICATION OF A-CELL OF HEAD/NECK;  Surgeon: Wallace Going, DO;  Location: [place];  Service: Plastics;  Laterality: N/A;  . BOTOX INJECTION N/A 07/29/2013   Procedure: BOTOX INJECTION;  Surgeon: Jerene Bears, MD;  [facility];  Service: Gastroenterology;  Laterality: N/A;  . BOTOX INJECTION N/A 05/04/2015   Procedure: BOTOX INJECTION;  Surgeon: Jerene Bears, MD;  Location[facility];  Service: Gastroenterology;  Laterality: N/A;  . BREAST BIOPSY Left    neg  . BREAST SURGERY Left 2002   bx of skin  . CHOLECYSTECTOMY    . COLONOSCOPY    . ESOPHAGEAL MANOMETRY N/A 12/16/2012   Procedure: ESOPHAGEAL MANOMETRY (EM);  Surgeon: Jerene Bears, MD;  [facility];  Service: Gastroenterology;  Laterality: N/A;  . ESOPHAGOGASTRODUODENOSCOPY (EGD) WITH PROPOFOL N/A 07/29/2013   Procedure: ESOPHAGOGASTRODUODENOSCOPY (EGD) WITH PROPOFOL;  Surgeon: Jerene Bears, MD;  [facility];  Service: Gastroenterology;  Laterality: N/A;  with botox injection  . ESOPHAGOGASTRODUODENOSCOPY (EGD) WITH PROPOFOL N/A 05/04/2015   Procedure: ESOPHAGOGASTRODUODENOSCOPY (EGD) WITH PROPOFOL;  Surgeon: Jerene Bears, MD;  [facility];  Service: Gastroenterology;  Laterality: N/A;  . EYE SURGERY     Catarct surgery 2014  . Eye Surgery AS Child Left   . INCISION AND DRAINAGE OF WOUND N/A 02/21/2017   Procedure: IRRIGATION AND DEBRIDEMENT WOUND NECK;  Surgeon: Wallace Going, DO;  Location: [place];  Service: Plastics;  Laterality: N/A;  . JEJUNOSTOMY FEEDING TUBE     x2 both failed. no longer has  . MULTIPLE TOOTH EXTRACTIONS     2 teeth removed  . POSTERIOR CERVICAL FUSION/FORAMINOTOMY N/A 01/10/2017   Procedure: LAMINECTOMY AND FORAMINOTOMY CERVICAL FOUR-CERVICAL FIVE, CERVICAL FIVE-SIX POSTERIOR CERVICAL INSTRUMENT FUSION CERVICAL THREE-CERVICAL SEVEN,CERVICAL LAMINECTOMY CERVICAL THREE-CERVICAL SEVEN.;  Surgeon: Eustace Moore, MD;   Location: [place];  Service: Neurosurgery;  Laterality: N/A;  posterior  . TRACHEOSTOMY  1996   done at [facility], Dr. Kathyrn Sheriff  . TUBAL LIGATION    . VIDEO BRONCHOSCOPY Bilateral 11/20/2012   Procedure: VIDEO BRONCHOSCOPY WITH FLUORO;  Surgeon: Juanito Doom, MD;  [facility];  Service: Cardiopulmonary;  Laterality: Bilateral;    Family History  Problem Relation Age of Onset  . Asthma Cousin   . COPD Cousin   . Breast cancer Maternal Grandmother 60  . Cancer Maternal Grandmother        breast  . Asthma Father   . Cancer Father        lung  . Kidney cancer Father   . Arrhythmia Father   . Cancer Paternal Uncle        lung  . COPD Paternal Grandfather   . Breast cancer Maternal Aunt 53  . Cancer Maternal Aunt        breast  . Bladder Cancer Neg Hx     Social History:  reports that she has never smoked. She has never used smokeless tobacco. She reports that she drinks alcohol.  She reports that she does not use drugs.  Patient is retired from an Sports coach. She ultimately went out on disability in 2001 following a tracheostomy. The patient is accompanied by her husband today.  Allergies:  Allergies  Allergen Reactions  . Asa [Aspirin] Shortness Of Breath, Swelling and Other (See Comments)    Tongue swells  . Bee Venom Anaphylaxis and Shortness Of Breath  . Eggs Or Egg-Derived Products Shortness Of Breath and Other (See Comments)    Patient CAN eat these; just cannot tolerate if contained within a flu vaccine  . Feraheme [Ferumoxytol] Other (See Comments)    Throat squeezing  . Influenza Vaccines Shortness Of Breath and Other (See Comments)    ONLY IF IT CONTAINS EGGS!!!!!  . Shellfish Allergy Anaphylaxis  . Quinolones Swelling and Other (See Comments)    Feet swell  . Ciprofloxacin Swelling and Other (See Comments)    ALL MEDS ENDING IN -FLOXACIN MAKE THE FEET SWELL  . Clarithromycin Nausea Only  . Erythromycin Nausea Only  . Levaquin [Levofloxacin  In D5w] Swelling and Other (See Comments)    Feet and legs ache and SWELL  . Peanut-Containing Drug Products Other (See Comments)    Wheezing (boiled or raw peanuts)  . Septra [Sulfamethoxazole-Trimethoprim] Diarrhea  . Telithromycin Nausea Only  . Zanaflex [Tizanidine] Other (See Comments)    Caused the patient to feel "spaced out" and "not well"  . Adhesive [Tape] Rash and Other (See Comments)    Paper works tape works fine   . Epinephrine Palpitations and Other (See Comments)    Also makes the heart race    Current Medications: Current Outpatient Medications  Medication Sig Dispense Refill  . acetaminophen (TYLENOL) 500 MG tablet Take 500 mg by mouth daily as needed for moderate pain or headache.    . albuterol (PROVENTIL HFA;VENTOLIN HFA) 108 (90 Base) MCG/ACT inhaler Inhale 2 puffs into the lungs every 6 (six) hours as needed for wheezing or shortness of breath. 1 Inhaler 5  . ALPRAZolam (XANAX) 0.25 MG tablet TAKE 1/2 TABLET BY MOUTH TWICE DAILY AS NEEDED 30 tablet 1  . cetirizine (ZYRTEC) 10 MG tablet TAKE ONE TABLET EVERY DAY 30 tablet 5  . clopidogrel (PLAVIX) 75 MG tablet TAKE ONE TABLET EVERY MORNING 90 tablet 1  . estradiol (ESTRACE) 1 MG tablet TAKE ONE TABLET BY MOUTH EVERY DAY 90 tablet 1  . furosemide (LASIX) 20 MG tablet TAKE ONE TABLET EVERY DAY AS NEEDED 90 tablet 1  . metoprolol succinate (TOPROL XL) 25 MG 24 hr tablet Take 0.5 tablets (12.5 mg total) by mouth daily. 90 tablet 3  . montelukast (SINGULAIR) 10 MG tablet TAKE 1 TABLET BY MOUTH AT BEDTIME 90 tablet 1  . pantoprazole (PROTONIX) 40 MG tablet TAKE 1 TABLET DAILY 90 tablet 1  . chlorpheniramine-HYDROcodone (TUSSIONEX PENNKINETIC ER) 10-8 MG/5ML SUER Take 5 mLs by mouth at bedtime as needed for cough. (Patient not taking: Reported on 12/11/2017) 50 mL 0  . EPINEPHrine (EPIPEN 2-PAK) 0.3 mg/0.3 mL IJ SOAJ injection Inject 0.3 mLs (0.3 mg total) into the muscle once. (Patient not taking: Reported on 12/11/2017) 1  Device 0  . famotidine (PEPCID) 40 MG tablet Take 1 tablet (40 mg total) by mouth 2 (two) times daily. (Patient not taking: Reported on 12/11/2017) 4 tablet 0  . FEROSUL 325 (65 Fe) MG tablet TAKE ONE TABLET BY MOUTH EVERY DAY WITH BREAKFAST (Patient not taking: Reported on 12/11/2017) 30 tablet 2  . fluticasone (FLONASE) 50  MCG/ACT nasal spray PLACE 2 SPRAYS INTO BOTH NOSTRILS DAILY. (Patient not taking: Reported on 12/11/2017) 16 g 2  . hydroxypropyl methylcellulose (ISOPTO TEARS) 2.5 % ophthalmic solution Place 1 drop into both eyes 3 (three) times daily as needed for dry eyes.    [name] ipratropium-albuterol (DUONEB) 0.5-2.5 (3) MG/3ML SOLN Take 3 mLs by nebulization every 4 (four) hours as needed. Dx 496 (Patient not taking: Reported on 12/11/2017) 120 mL 2  . meloxicam (MOBIC) 15 MG tablet Take 15 mg by mouth daily as needed.     . mupirocin ointment (BACTROBAN) 2 % Apply 1 application daily as needed topically (FOR [facility] SITE IRRITATION).     [name] potassium chloride 20 MEQ/15ML (10%) SOLN Take 15 mLs (20 mEq total) by mouth daily as needed (cramps). (Patient not taking: Reported on 12/11/2017) 240 mL 1  . Probiotic Product (PROBIOTIC PO) Take 1 capsule daily as needed by mouth (FOR G.I. DISTRESS).     . sodium chloride (OCEAN) 0.65 % SOLN nasal spray Place 1 spray into both nostrils every 4 (four) hours as needed for congestion.     [name] tiZANidine (ZANAFLEX) 2 MG tablet Take 2 mg by mouth every 6 (six) hours as needed for muscle spasms.     Current Facility-Administered Medications  Medication Dose Route Frequency Provider Last Rate Last Dose  . cyanocobalamin ((VITAMIN B-12)) injection 1,000 mcg  1,000 mcg Intramuscular Once [name], MD      . ipratropium-albuterol (DUONEB) 0.5-2.5 (3) MG/3ML nebulizer solution 3 mL  3 mL Nebulization Q6H Burnard Hawthorne, FNP   3 mL at 05/18/17 1437    Review of Systems  Constitutional: Positive for malaise/fatigue. Negative for diaphoresis, fever and  weight loss ("limited" appetite).  HENT: Negative.   Eyes: Negative.   Respiratory: Positive for shortness of breath (chronic related to asthma diagnosis). Negative for cough, hemoptysis and sputum production.   Cardiovascular: Negative for chest pain, palpitations, orthopnea, leg swelling and PND.  Gastrointestinal: Negative for abdominal pain, blood in stool, constipation, diarrhea, melena, nausea and vomiting.       IBS. Dysphagia related to issues with LES.  Genitourinary: Negative for dysuria, frequency, hematuria and urgency.  Musculoskeletal: Negative for back pain, falls, joint pain and myalgias.  Skin: Negative for itching and rash.  Neurological: Negative for dizziness, tremors, weakness and headaches.       Restless legs  Endo/Heme/Allergies: Does not bruise/bleed easily.  Psychiatric/Behavioral: Negative for depression, memory loss and suicidal ideas. The patient is not nervous/anxious and does not have insomnia.   All other systems reviewed and are negative.  Performance status (ECOG): 0 - Asymptomatic  Vital Signs: BP (!) 102/57 (BP Location: Left Arm, Patient Position: Sitting)   Pulse 65   Temp 97.8 F (36.6 C) (Tympanic)   Resp 16   Wt 106 lb (48.1 kg)   SpO2 100%   BMI 18.48 kg/m   Physical Exam  Constitutional: She is oriented to person, place, and time.  Thin woman sitting comfortably in the exam room in no acute distress.  HENT:  Head: Normocephalic and atraumatic.  Short light brown hair.  Eyes: Pupils are equal, round, and reactive to light. EOM are normal. No scleral icterus.  Neck: Normal range of motion. Neck supple. No tracheal deviation present. No thyromegaly present.  Cardiovascular: Normal rate, regular rhythm and normal heart sounds. Exam reveals no gallop and no friction rub.  No murmur heard. Pulmonary/Chest: Effort normal and breath sounds normal. No respiratory distress. She  has no wheezes. She has no rales.  Abdominal: Soft. Bowel sounds  are normal. She exhibits no distension. There is no tenderness.  Musculoskeletal: Normal range of motion. She exhibits no edema or tenderness.  Lymphadenopathy:    She has no cervical adenopathy.    She has no axillary adenopathy.       Right: No inguinal and no supraclavicular adenopathy present.       Left: No inguinal and no supraclavicular adenopathy present.  Neurological: She is alert and oriented to person, place, and time.  Skin: Skin is warm and dry. No rash noted. No erythema.  Pale.  Psychiatric: Mood, affect and judgment normal.  Nursing note and vitals reviewed.   No visits with results within 3 Day(s) from this visit.  Latest known visit with results is:  Lab on 11/01/2017  Component Date Value Ref Range Status  . Potassium 11/01/2017 3.7  3.5 - 5.1 mEq/L Final    Assessment:  Anne [name] is a 67 y.o. female with iron deficiency anemia.  Diet is poor.  She was unresponsive to oral iron.  EGD on 10/02/2017 revealed a normal stomach and duodenum.  The GE junction was patulous.  Random duodenal biopsies revealed no evidence of sprue, active inflammation or other disorders.  Colonoscopy on 10/02/2017 was normal.  She received Feraheme 510 mg IV on 10/25/2017 and 10/31/2017.  With her second infusion, she developed an itchy throat.  She received Benadryl and Solumedrol.   Ferritin has been followed:  30.2 on 09/03/2012, 8 on 08/09/2017, and 7.2 on 10/15/2017.  She has vitamin B12 deficiency.  B12 was 621 on 04/28/2014. She receives monthly parenteral B12.   MRI abdomen on 03/29/2017 revealed a stable to minimal increase in size of the 1.2 cm enhancing lesion arising from the upper pole of left kidney which is suspicious for a small renal neoplasm.  She is followed by Dr. Pilar Jarvis.  Symptomatically, she is fatigued.  She denies any melena, hematochezia, hematuria or vaginal bleeding.  Exam is unremarkable.  Plan: 1. Labs today: CBC with diff, ferritin, iron studies,  UA. 2. Iron deficiency anemia  Check additional labs today.  Diet "limited" with poor iron rich food intake. Very little meat due to GI issues (LES "tight"). Encouraged her to increase intake of iron rich foods (i.e. green leafy vegetables).   Trial of oral iron did not improve symptoms significantly. Received Feraheme infusions that resulted in reaction.   Discussed alternative IV iron formulation (Venofer). Due to previous respiratory issues (asthma exacerbation leading to tracheostomy). Will premedicate patient (Benadryl and Decadron with plan to taper to off if tolerated).  Preauthorize Venofer 3.   Left upper pole renal lesion:   Follow-up as scheduled with Dr. Pilar Jarvis (last seen 04/05/2017).  Per notes renal ultrasound was due in 09/2017. 3. RTC in 3 months for labs (CBC with diff, ferritin). 4. RTC in 6 months for MD assessment, labs (CBC with diff, ferritin - day before), and +/- Venofer.    Addendum: Labs reviewed. Hemoglobin 12.6, hematocrit 37.9, MCV 93.7, and platelets 208,000. Ferritin 255. Iron saturation 30% with a TIBC of 315.  No Venofer needed at this time. Will plan on rechecking labs in 3 months as scheduled. Lab results communicated to patient via MyChart as requested.    Honor Loh, NP  12/11/2017, 9:51 AM   I saw and evaluated the patient, participating in the key portions of the service and reviewing pertinent diagnostic studies and records.  I reviewed the  nurse practitioner's note and agree with the findings and the plan.  The assessment and plan were discussed with the patient. Multiple questions were asked by the patient and answered.   Nolon Stalls, MD 12/11/2017, 9:51 AM

## 2017-12-13 ENCOUNTER — Other Ambulatory Visit: Payer: Self-pay | Admitting: Family Medicine

## 2017-12-13 ENCOUNTER — Telehealth: Payer: Self-pay

## 2017-12-13 NOTE — Telephone Encounter (Signed)
Copied from [place] [phone]. Topic: Appointment Scheduling - Scheduling Inquiry for Clinic >> Dec 11, 2017 11:57 AM Lennox Solders wrote: Reason for CRM: pt had last b12 injection on 10-09-17. Pt would like another one. I can not sch without crm >> Dec 13, 2017  4:33 PM Ahmed Prima L wrote: Patient is calling to see if and when she can get her B12 shot

## 2017-12-14 NOTE — Telephone Encounter (Signed)
It is ok to get her scheduled for a b12 injection.

## 2017-12-14 NOTE — Telephone Encounter (Signed)
Patient scheduled for B 12 injection , she is also requesting refill on furosemide 20 mg PRN as needed for swelling, no CMET since 3/19 last Potassium 11/01/17  = 3.7? Ok to fill?

## 2017-12-14 NOTE — Telephone Encounter (Signed)
Patient has not had a B 12 level since 2016 last level was 620,ok to schedule for B 12 injection last injection was 7/19.

## 2017-12-14 NOTE — Telephone Encounter (Signed)
It appears this was already refilled. 

## 2017-12-19 ENCOUNTER — Other Ambulatory Visit: Payer: Self-pay

## 2017-12-19 ENCOUNTER — Ambulatory Visit (INDEPENDENT_AMBULATORY_CARE_PROVIDER_SITE_OTHER): Payer: Medicare Other | Admitting: *Deleted

## 2017-12-19 DIAGNOSIS — E538 Deficiency of other specified B group vitamins: Secondary | ICD-10-CM

## 2017-12-19 DIAGNOSIS — K220 Achalasia of cardia: Secondary | ICD-10-CM

## 2017-12-19 DIAGNOSIS — Z23 Encounter for immunization: Secondary | ICD-10-CM

## 2017-12-19 MED ORDER — CYANOCOBALAMIN 1000 MCG/ML IJ SOLN
1000.0000 ug | Freq: Once | INTRAMUSCULAR | Status: AC
  Administered 2017-12-19: 1000 ug via INTRAMUSCULAR

## 2017-12-19 NOTE — Progress Notes (Signed)
Patient presented for B 12 injection to right deltoid, patient voiced no concerns nor showed any signs of distress during injection. Given in right deltoid due to patient ask for Flu vaccine to be given in left arm.

## 2017-12-19 NOTE — Telephone Encounter (Signed)
Pt scheduled for EGD with botox at [facility] 02/18/18@8 :30am. Pt aware of appt.

## 2017-12-20 ENCOUNTER — Other Ambulatory Visit: Payer: Self-pay | Admitting: Family Medicine

## 2017-12-21 ENCOUNTER — Telehealth: Payer: Self-pay

## 2017-12-21 NOTE — Telephone Encounter (Signed)
Patient is scheduled for EGD with botox injection at [facility] 02/18/18@8 :30am. Please advise if pt may hold Plavix for 5 days prior to procedure.

## 2017-12-25 ENCOUNTER — Ambulatory Visit (HOSPITAL_COMMUNITY): Admit: 2017-12-25 | Payer: Medicare Other | Admitting: Internal Medicine

## 2017-12-25 ENCOUNTER — Encounter (HOSPITAL_COMMUNITY): Payer: Self-pay

## 2017-12-25 ENCOUNTER — Telehealth: Payer: Self-pay

## 2017-12-25 SURGERY — ESOPHAGOGASTRODUODENOSCOPY (EGD) WITH PROPOFOL
Anesthesia: Monitor Anesthesia Care

## 2017-12-25 NOTE — Telephone Encounter (Signed)
Please see the other phone note from 12/21/17. Thanks.

## 2017-12-25 NOTE — Telephone Encounter (Signed)
Patient can hold plavix for 5 days prior to the procedure. She is on this medication for a history of TIA in the distant past. She should be made aware that there is a small risk of stopping this medication and if she develops any TIA or stroke symptoms she should be evaluated. Thanks.

## 2017-12-25 NOTE — Telephone Encounter (Signed)
Noted  

## 2017-12-25 NOTE — Telephone Encounter (Signed)
Please advise if pt may hold Plavix for her procedure and for how long. See note below.

## 2017-12-25 NOTE — Telephone Encounter (Signed)
Check on Anticoag clearance.

## 2017-12-25 NOTE — Telephone Encounter (Signed)
Left message for pt to call back  °

## 2017-12-26 NOTE — Telephone Encounter (Signed)
Message left for pt and letter mailed to pt.

## 2018-01-17 ENCOUNTER — Telehealth: Payer: Self-pay | Admitting: Pulmonary Disease

## 2018-01-17 NOTE — Telephone Encounter (Signed)
Pt states she has stopped up head and ears, can hardly talk, drainage and congestion in her chest, nose is stuffed up.  States she has been taking cough/cold medications over the counter. Please call to discuss.

## 2018-01-18 MED ORDER — AZITHROMYCIN 250 MG PO TABS: ORAL_TABLET | ORAL | 0 refills | Status: AC

## 2018-01-18 MED ORDER — PREDNISONE 10 MG PO TABS: 20.0000 mg | ORAL_TABLET | Freq: Every day | ORAL | 0 refills | Status: DC

## 2018-01-18 NOTE — Telephone Encounter (Signed)
rx sent- patient aware  

## 2018-01-18 NOTE — Telephone Encounter (Signed)
Please call today to discuss patient is concerned about not hearing a response .  Patient is worried she will get sicker if she doesn't take something before it gets deeper in chest .

## 2018-01-18 NOTE — Telephone Encounter (Signed)
Treated with Zpak and short course of prednisone 20 mg daily for 7 days

## 2018-01-22 ENCOUNTER — Ambulatory Visit (INDEPENDENT_AMBULATORY_CARE_PROVIDER_SITE_OTHER): Payer: Medicare Other

## 2018-01-22 DIAGNOSIS — E538 Deficiency of other specified B group vitamins: Secondary | ICD-10-CM | POA: Diagnosis not present

## 2018-01-22 MED ORDER — CYANOCOBALAMIN 1000 MCG/ML IJ SOLN
1000.0000 ug | Freq: Once | INTRAMUSCULAR | Status: AC
  Administered 2018-01-22: 1000 ug via INTRAMUSCULAR

## 2018-01-22 NOTE — Progress Notes (Signed)
Pt was seen today for a NV and was given B-12 shot in LD. Pt tolerated well.

## 2018-02-04 ENCOUNTER — Ambulatory Visit (INDEPENDENT_AMBULATORY_CARE_PROVIDER_SITE_OTHER): Payer: Medicare Other | Admitting: Family Medicine

## 2018-02-04 ENCOUNTER — Encounter: Payer: Self-pay | Admitting: Family Medicine

## 2018-02-04 VITALS — BP 96/60 | HR 60 | Temp 97.7°F | Ht 63.5 in | Wt 106.4 lb

## 2018-02-04 DIAGNOSIS — D509 Iron deficiency anemia, unspecified: Secondary | ICD-10-CM | POA: Diagnosis not present

## 2018-02-04 DIAGNOSIS — M5442 Lumbago with sciatica, left side: Secondary | ICD-10-CM | POA: Diagnosis not present

## 2018-02-04 DIAGNOSIS — N289 Disorder of kidney and ureter, unspecified: Secondary | ICD-10-CM

## 2018-02-04 DIAGNOSIS — G8929 Other chronic pain: Secondary | ICD-10-CM

## 2018-02-04 DIAGNOSIS — N951 Menopausal and female climacteric states: Secondary | ICD-10-CM

## 2018-02-04 DIAGNOSIS — J0140 Acute pansinusitis, unspecified: Secondary | ICD-10-CM

## 2018-02-04 DIAGNOSIS — M5441 Lumbago with sciatica, right side: Secondary | ICD-10-CM

## 2018-02-04 LAB — BASIC METABOLIC PANEL
BUN: 12 mg/dL (ref 6–23)
CO2: 32 mEq/L (ref 19–32)
Calcium: 9.7 mg/dL (ref 8.4–10.5)
Chloride: 104 mEq/L (ref 96–112)
Creatinine, Ser: 0.99 mg/dL (ref 0.40–1.20)
GFR: 59.21 mL/min — ABNORMAL LOW (ref >60.00)
Glucose, Bld: 87 mg/dL (ref 70–99)
Potassium: 3.6 mEq/L (ref 3.5–5.1)
Sodium: 143 mEq/L (ref 135–145)

## 2018-02-04 MED ORDER — AMOXICILLIN-POT CLAVULANATE 875-125 MG PO TABS: 1.0000 | ORAL_TABLET | Freq: Two times a day (BID) | ORAL | 0 refills | Status: DC

## 2018-02-04 NOTE — Patient Instructions (Addendum)
Nice to see you. You will be due for a pneumonia vaccine next year.  Will be a Pneumovax. Please try to cut your Estrace tablet in half to equal 0.5 mg daily.  Please monitor for any postmenopausal symptoms with this dose.  If you do well on this dose after 1 to 2 months please let us know and we could consider bringing you off of this. We will treat you with Augmentin for a sinus infection. Please continue to see hematology. If your back pain worsens please be evaluated.  Please see the neurosurgeon as discussed.

## 2018-02-07 ENCOUNTER — Telehealth: Payer: Self-pay | Admitting: Family Medicine

## 2018-02-07 DIAGNOSIS — M545 Low back pain, unspecified: Secondary | ICD-10-CM | POA: Insufficient documentation

## 2018-02-07 NOTE — Progress Notes (Addendum)
Tommi Rumps, MD Phone: [phone]  Anne [name] is a 67 y.o. female who presents today for f/u.  CC: HRT, low back pain, iron deficiency anemia, renal lesion, sinus infection  Hormone replacement therapy: Patient has been on Estrace for many years.  She has tried to come off in the past though she developed nervousness, sleep difficulty, and hot flashes.  She is aware of the potential long-term risks of this medication.  Low back pain: Notes this has been giving her a fit.  Notes her left leg will become numb at times depending on how she is standing and if she stands in the same position for too long.  She has seen neurosurgery and has gotten injections though they are not lasting as long.  Does radiate down her left leg greater than her right leg.  Gabapentin has not been helpful.  Robaxin is not terribly helpful.  She has been on meloxicam and ibuprofen as well as Tylenol previously.  Iron deficiency: She had an issue with the Feraheme.  She had allergic reaction.  She is going to have an EGD coming up for Botox.  She continues to follow with hematology.  Most recent iron studies were acceptable.  Renal lesion: She is following with urology at [facility] now.  She notes no hematuria.  Sinus infection: Patient notes she is been congested for about a month.  Notes it comes and goes.  Postnasal drip.  She is blowing discolored mucus out of her nose.  Some scratchy eyes.  Some cough.  Social History   Tobacco Use  Smoking Status Never Smoker  Smokeless Tobacco Never Used     ROS see history of present illness  Objective  Physical Exam Vitals:   02/04/18 0937  BP: 96/60  Pulse: 60  Temp: 97.7 F (36.5 C)  SpO2: 99%    BP Readings from Last 3 Encounters:  02/04/18 96/60  12/11/17 (!) 102/57  10/31/17 (!) 105/54   Wt Readings from Last 3 Encounters:  02/04/18 106 lb 6.4 oz (48.3 kg)  12/11/17 106 lb (48.1 kg)  10/10/17 102 lb (46.3 kg)    Physical Exam    Constitutional: No distress.  HENT:  Head: Normocephalic and atraumatic.  Mouth/Throat: Oropharynx is clear and moist.  Normal TMs, bilateral maxillary and frontal sinuses tender to percussion  Eyes: Pupils are equal, round, and reactive to light. Conjunctivae are normal.  Cardiovascular: Normal rate, regular rhythm and normal heart sounds.  Pulmonary/Chest: Effort normal and breath sounds normal.  Musculoskeletal: She exhibits no edema.  Neurological: She is alert.  Skin: Skin is warm and dry. She is not diaphoretic.     Assessment/Plan: Please see individual problem list.  Anemia Patient with iron deficiency.  She will continue to see hematology and GI.  Kidney lesion She will continue to follow with urology at [facility].  Menopausal symptom Patient has tried to taper off of hormone replacement previously.  She had recurrence of symptoms.  I discussed the risks of this medication and the typical duration that patients are on this.  We will attempt to decrease her dose to 0.5 mg of Estrace daily.  She will try this for a month and see if she has development of symptoms.  If she does not we could attempt to taper her off.  Low back pain She will continue to follow with her neurosurgeon.  Sinusitis Symptoms concerning for sinusitis.  We will treat with Augmentin.   Orders Placed This Encounter  Procedures  .  Basic Metabolic Panel (BMET)    Meds ordered this encounter  Medications  . amoxicillin-clavulanate (AUGMENTIN) 875-125 MG tablet    Sig: Take 1 tablet by mouth 2 (two) times daily.    Dispense:  14 tablet    Refill:  0     Tommi Rumps, MD [place]

## 2018-02-07 NOTE — Assessment & Plan Note (Signed)
Patient has tried to taper off of hormone replacement previously.  She had recurrence of symptoms.  I discussed the risks of this medication and the typical duration that patients are on this.  We will attempt to decrease her dose to 0.5 mg of Estrace daily.  She will try this for a month and see if she has development of symptoms.  If she does not we could attempt to taper her off.

## 2018-02-07 NOTE — Telephone Encounter (Signed)
Copied from [place] [id number]. Topic: Quick Communication - See Telephone Encounter >> Feb 07, 2018  1:31 PM Anne [name] wrote: CRM for notification. See Telephone encounter for: 02/07/18.  Pt states she saw Dr [name] on Monday, November 11th and he gave her amoxicillin-clavulanate (AUGMENTIN) 875-125 MG tablet. She said she wants something to dry up the congestion. She is requesting " guaifenesin 100-10mg , 5ML. She said that Dr Marlana Latus gave this to her last year at [facility] clinic and she has a tiny bit left. It does help her at bedtime. She would like the nurse to call her back. She sounds very hoarse and congested.  [facility] - [place], [place] - [address] [place] [address]

## 2018-02-07 NOTE — Assessment & Plan Note (Signed)
She will continue to follow with urology at [facility].

## 2018-02-07 NOTE — Assessment & Plan Note (Signed)
She will continue to follow with her neurosurgeon.

## 2018-02-07 NOTE — Assessment & Plan Note (Signed)
Symptoms concerning for sinusitis.  We will treat with Augmentin.

## 2018-02-07 NOTE — Assessment & Plan Note (Signed)
Patient with iron deficiency.  She will continue to see hematology and GI.

## 2018-02-08 MED ORDER — GUAIFENESIN-CODEINE 100-10 MG/5ML PO SOLN: 5.0000 mL | Freq: Three times a day (TID) | ORAL | 0 refills | Status: DC | PRN

## 2018-02-08 NOTE — Telephone Encounter (Signed)
The medication she is referring to likely contains codeine based on the dosing information that was given.  She could take Mucinex over-the-counter as that would help with her congestion.  If she needs something for cough we could prescribe the Mucinex with the codeine.

## 2018-02-08 NOTE — Telephone Encounter (Signed)
Patient advised.

## 2018-02-08 NOTE — Telephone Encounter (Signed)
Pt would like a nurse to return her call

## 2018-02-08 NOTE — Telephone Encounter (Signed)
Spoke with patient she has been using Mucinex OTC , she would like to try the Mucinex with codeine.

## 2018-02-08 NOTE — Telephone Encounter (Signed)
Noted. Sent to pharmacy.  

## 2018-02-15 ENCOUNTER — Encounter (HOSPITAL_COMMUNITY): Payer: Self-pay | Admitting: Emergency Medicine

## 2018-02-15 ENCOUNTER — Other Ambulatory Visit: Payer: Self-pay

## 2018-02-18 ENCOUNTER — Encounter (HOSPITAL_COMMUNITY): Payer: Self-pay | Admitting: Anesthesiology

## 2018-02-18 ENCOUNTER — Ambulatory Visit (HOSPITAL_COMMUNITY): Payer: Medicare Other | Admitting: Anesthesiology

## 2018-02-18 ENCOUNTER — Encounter (HOSPITAL_COMMUNITY)
Admission: RE | Disposition: A | Discharge status: Home / Self Care | Payer: Self-pay | Source: Ambulatory Visit | Attending: Internal Medicine

## 2018-02-18 ENCOUNTER — Other Ambulatory Visit: Payer: Self-pay

## 2018-02-18 ENCOUNTER — Ambulatory Visit (HOSPITAL_COMMUNITY)
Admission: RE | Admit: 2018-02-18 | Discharge: 2018-02-18 | Disposition: A | Discharge status: Home / Self Care | Payer: Medicare Other | Source: Ambulatory Visit | Attending: Internal Medicine | Admitting: Internal Medicine

## 2018-02-18 DIAGNOSIS — Z887 Allergy status to serum and vaccine status: Secondary | ICD-10-CM | POA: Diagnosis not present

## 2018-02-18 DIAGNOSIS — Z881 Allergy status to other antibiotic agents status: Secondary | ICD-10-CM | POA: Diagnosis not present

## 2018-02-18 DIAGNOSIS — Z91010 Allergy to peanuts: Secondary | ICD-10-CM | POA: Diagnosis not present

## 2018-02-18 DIAGNOSIS — K449 Diaphragmatic hernia without obstruction or gangrene: Secondary | ICD-10-CM | POA: Insufficient documentation

## 2018-02-18 DIAGNOSIS — Z882 Allergy status to sulfonamides status: Secondary | ICD-10-CM | POA: Diagnosis not present

## 2018-02-18 DIAGNOSIS — Z9851 Tubal ligation status: Secondary | ICD-10-CM | POA: Insufficient documentation

## 2018-02-18 DIAGNOSIS — Z91013 Allergy to seafood: Secondary | ICD-10-CM | POA: Diagnosis not present

## 2018-02-18 DIAGNOSIS — K589 Irritable bowel syndrome without diarrhea: Secondary | ICD-10-CM | POA: Insufficient documentation

## 2018-02-18 DIAGNOSIS — R1310 Dysphagia, unspecified: Secondary | ICD-10-CM | POA: Insufficient documentation

## 2018-02-18 DIAGNOSIS — Z981 Arthrodesis status: Secondary | ICD-10-CM | POA: Insufficient documentation

## 2018-02-18 DIAGNOSIS — Z8669 Personal history of other diseases of the nervous system and sense organs: Secondary | ICD-10-CM | POA: Insufficient documentation

## 2018-02-18 DIAGNOSIS — Z8051 Family history of malignant neoplasm of kidney: Secondary | ICD-10-CM | POA: Insufficient documentation

## 2018-02-18 DIAGNOSIS — R0600 Dyspnea, unspecified: Secondary | ICD-10-CM | POA: Diagnosis not present

## 2018-02-18 DIAGNOSIS — Z91012 Allergy to eggs: Secondary | ICD-10-CM | POA: Insufficient documentation

## 2018-02-18 DIAGNOSIS — J383 Other diseases of vocal cords: Secondary | ICD-10-CM | POA: Insufficient documentation

## 2018-02-18 DIAGNOSIS — Z825 Family history of asthma and other chronic lower respiratory diseases: Secondary | ICD-10-CM | POA: Insufficient documentation

## 2018-02-18 DIAGNOSIS — K220 Achalasia of cardia: Secondary | ICD-10-CM | POA: Diagnosis present

## 2018-02-18 DIAGNOSIS — J309 Allergic rhinitis, unspecified: Secondary | ICD-10-CM | POA: Insufficient documentation

## 2018-02-18 DIAGNOSIS — D509 Iron deficiency anemia, unspecified: Secondary | ICD-10-CM | POA: Diagnosis not present

## 2018-02-18 DIAGNOSIS — Z9049 Acquired absence of other specified parts of digestive tract: Secondary | ICD-10-CM | POA: Insufficient documentation

## 2018-02-18 DIAGNOSIS — Z91030 Bee allergy status: Secondary | ICD-10-CM | POA: Insufficient documentation

## 2018-02-18 DIAGNOSIS — Z801 Family history of malignant neoplasm of trachea, bronchus and lung: Secondary | ICD-10-CM | POA: Insufficient documentation

## 2018-02-18 DIAGNOSIS — J449 Chronic obstructive pulmonary disease, unspecified: Secondary | ICD-10-CM | POA: Insufficient documentation

## 2018-02-18 DIAGNOSIS — Z930 Tracheostomy status: Secondary | ICD-10-CM | POA: Insufficient documentation

## 2018-02-18 DIAGNOSIS — Z888 Allergy status to other drugs, medicaments and biological substances status: Secondary | ICD-10-CM | POA: Insufficient documentation

## 2018-02-18 DIAGNOSIS — Z91048 Other nonmedicinal substance allergy status: Secondary | ICD-10-CM | POA: Insufficient documentation

## 2018-02-18 DIAGNOSIS — Z803 Family history of malignant neoplasm of breast: Secondary | ICD-10-CM | POA: Insufficient documentation

## 2018-02-18 DIAGNOSIS — Z90710 Acquired absence of both cervix and uterus: Secondary | ICD-10-CM | POA: Diagnosis not present

## 2018-02-18 DIAGNOSIS — Z886 Allergy status to analgesic agent status: Secondary | ICD-10-CM | POA: Diagnosis not present

## 2018-02-18 DIAGNOSIS — Z8673 Personal history of transient ischemic attack (TIA), and cerebral infarction without residual deficits: Secondary | ICD-10-CM | POA: Diagnosis not present

## 2018-02-18 DIAGNOSIS — J8410 Pulmonary fibrosis, unspecified: Secondary | ICD-10-CM | POA: Insufficient documentation

## 2018-02-18 HISTORY — PX: ESOPHAGOGASTRODUODENOSCOPY (EGD) WITH PROPOFOL: SHX5813

## 2018-02-18 HISTORY — PX: BOTOX INJECTION: SHX5754

## 2018-02-18 SURGERY — ESOPHAGOGASTRODUODENOSCOPY (EGD) WITH PROPOFOL
Anesthesia: Monitor Anesthesia Care

## 2018-02-18 MED ORDER — SODIUM CHLORIDE (PF) 0.9 % IJ SOLN
INTRAMUSCULAR | Status: AC
  Filled 2018-02-18: qty 10

## 2018-02-18 MED ORDER — SODIUM CHLORIDE 0.9 % IV SOLN: INTRAVENOUS | Status: DC

## 2018-02-18 MED ORDER — ONABOTULINUMTOXINA 100 UNITS IJ SOLR
INTRAMUSCULAR | Status: AC
  Filled 2018-02-18: qty 100

## 2018-02-18 MED ORDER — SODIUM CHLORIDE (PF) 0.9 % IJ SOLN
INTRAMUSCULAR | Status: DC | PRN
  Administered 2018-02-18: 4 mL via SUBMUCOSAL

## 2018-02-18 MED ORDER — LACTATED RINGERS IV SOLN
INTRAVENOUS | Status: DC
  Administered 2018-02-18: 09:00:00 via INTRAVENOUS

## 2018-02-18 MED ORDER — PROPOFOL 500 MG/50ML IV EMUL
INTRAVENOUS | Status: DC | PRN
  Administered 2018-02-18 (×3): 20 mg via INTRAVENOUS

## 2018-02-18 MED ORDER — LIDOCAINE HCL (CARDIAC) PF 100 MG/5ML IV SOSY
PREFILLED_SYRINGE | INTRAVENOUS | Status: DC | PRN
  Administered 2018-02-18: 50 mg via INTRATRACHEAL

## 2018-02-18 MED ORDER — PROPOFOL 500 MG/50ML IV EMUL
INTRAVENOUS | Status: DC | PRN
  Administered 2018-02-18: 75 ug/kg/min via INTRAVENOUS

## 2018-02-18 MED ORDER — PROPOFOL 10 MG/ML IV BOLUS
INTRAVENOUS | Status: AC
  Filled 2018-02-18: qty 40

## 2018-02-18 SURGICAL SUPPLY — 14 items

## 2018-02-18 NOTE — Op Note (Signed)
[facility] Patient Name: Anne [name] Procedure Date: 02/18/2018 MRN: [id number] Attending MD: Jerene Bears , MD Date of Birth: 03/14/1951 CSN: [id number] Age: 67 Admit Type: Inpatient Procedure:                Upper GI endoscopy Indications:              For botulinum toxin injection of achalasia Providers:                Lajuan Lines. Hilarie Fredrickson, MD, Cleda Daub, RN, William Dalton, Technician Referring MD:              Medicines:                Monitored Anesthesia Care Complications:            No immediate complications. Estimated Blood Loss:     Estimated blood loss was minimal. Procedure:                Pre-Anesthesia Assessment:                           - Prior to the procedure, a History and Physical                            was performed, and patient medications and                            allergies were reviewed. The patient's tolerance of                            previous anesthesia was also reviewed. The risks                            and benefits of the procedure and the sedation                            options and risks were discussed with the patient.                            All questions were answered, and informed consent                            was obtained. Prior Anticoagulants: The patient has                            taken Plavix (clopidogrel), last dose was 5 days                            prior to procedure. ASA Grade Assessment: III - A                            patient with severe systemic disease. After  reviewing the risks and benefits, the patient was                            deemed in satisfactory condition to undergo the                            procedure.                           After obtaining informed consent, the endoscope was                            passed under direct vision. Throughout the                            procedure, the patient's blood  pressure, pulse, and                            oxygen saturations were monitored continuously. The                            GIF-H190 ([id number] Olympus adult endoscope was                            introduced through the mouth, and advanced to the                            second part of duodenum. The upper GI endoscopy was                            accomplished without difficulty. The patient                            tolerated the procedure well. Scope In: Scope Out: Findings:      The examined esophagus was normal. LES was successfully injected with       four separate injections (25 mL each) in four quadrants. 100 units       botulinum toxin total.      The entire examined stomach was normal.      The examined duodenum was normal. Impression:               - Normal esophagus. Injected with botulinum toxin                            at LES.                           - Normal stomach.                           - Normal examined duodenum.                           - No specimens collected. Moderate Sedation:      N/A Recommendation:           - Patient has  a contact number available for                            emergencies. The signs and symptoms of potential                            delayed complications were discussed with the                            patient. Return to normal activities tomorrow.                            Written discharge instructions were provided to the                            patient.                           - Resume previous diet.                           - Continue present medications.                           - Resume Plavix (clopidogrel) at prior dose                            tomorrow. Refer to managing physician for further                            adjustment of therapy. Procedure Code(s):        --- Professional ---                           [phone], Esophagogastroduodenoscopy, flexible,                            transoral; with  directed submucosal injection(s),                            any substance Diagnosis Code(s):        --- Professional ---                           K22.0, Achalasia of cardia CPT copyright 2018 American Medical Association. All rights reserved. The codes documented in this report are preliminary and upon coder review may  be revised to meet current compliance requirements. Jerene Bears, MD 02/18/2018 9:31:11 AM This report has been signed electronically. Number of Addenda: 0

## 2018-02-18 NOTE — Discharge Instructions (Signed)
YOU HAD AN ENDOSCOPIC PROCEDURE TODAY: Refer to the procedure report and other information in the discharge instructions given to you for any specific questions about what was found during the examination. If this information does not answer your questions, please call [place] office at [phone] to clarify.  ° °YOU SHOULD EXPECT: Some feelings of bloating in the abdomen. Passage of more gas than usual. Walking can help get rid of the air that was put into your GI tract during the procedure and reduce the bloating. If you had a lower endoscopy (such as a colonoscopy or flexible sigmoidoscopy) you may notice spotting of blood in your stool or on the toilet paper. Some abdominal soreness may be present for a day or two, also. ° °DIET: Your first meal following the procedure should be a light meal and then it is ok to progress to your normal diet. A half-sandwich or bowl of soup is an example of a good first meal. Heavy or fried foods are harder to digest and may make you feel nauseous or bloated. Drink plenty of fluids but you should avoid alcoholic beverages for 24 hours. If you had a esophageal dilation, please see attached instructions for diet.   ° °ACTIVITY: Your care partner should take you home directly after the procedure. You should plan to take it easy, moving slowly for the rest of the day. You can resume normal activity the day after the procedure however YOU SHOULD NOT DRIVE, use power tools, machinery or perform tasks that involve climbing or major physical exertion for 24 hours (because of the sedation medicines used during the test).  ° °SYMPTOMS TO REPORT IMMEDIATELY: °A gastroenterologist can be reached at any hour. Please call [phone]  for any of the following symptoms:  °Following lower endoscopy (colonoscopy, flexible sigmoidoscopy) °Excessive amounts of blood in the stool  °Significant tenderness, worsening of abdominal pains  °Swelling of the abdomen that is new, acute  °Fever of 100° or  higher  °Following upper endoscopy (EGD, EUS, ERCP, esophageal dilation) °Vomiting of blood or coffee ground material  °New, significant abdominal pain  °New, significant chest pain or pain under the shoulder blades  °Painful or persistently difficult swallowing  °New shortness of breath  °Black, tarry-looking or red, bloody stools ° °FOLLOW UP:  °If any biopsies were taken you will be contacted by phone or by letter within the next 1-3 weeks. Call [phone]  if you have not heard about the biopsies in 3 weeks.  °Please also call with any specific questions about appointments or follow up tests. ° °

## 2018-02-18 NOTE — H&P (Signed)
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   Prior upper endoscopy with Botox injection to the LES has improved dysphagia symptoms significantly.  She met with Dr. Silverio Decamp and deferred pneumatic dilation at this point.  She refers repeat Botox injection.  No abdominal pain, nausea or vomiting.  Stable dysphagia.  No lower GI complaint.   Past Medical History:  Diagnosis Date  . Allergic rhinitis   . Allergy    multiple, mostly aspirin, levaquin and shellfish.  . Anemia   . Asthma   . Cataract 2014   exraction with len implant  . Complication of anesthesia    Breathing problems upon waking up. 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  . Compressed cervical disc   . COPD (chronic obstructive pulmonary disease) ([place])   . CVA (cerebral infarction)   . Dyspnea   . Esophageal dysmotility   . Heart murmur    as child  . History of hiatal hernia   . IBS (irritable bowel syndrome)   . Migraine   . PICC (peripherally inserted central catheter) removal 02/20/2017  . PONV (postoperative nausea and vomiting)   . Problems with swallowing    intermittently  . Pulmonary fibrosis ([place])   . Shingles   . Stroke [facility])    slurred speech, drawn face, imaging normal, occurred twice, [facility]-"TIA" if antything" 02/20/17-no residual effects  . Tracheostomy in place [facility])   . Vocal cord paresis     Past Surgical History:  Procedure Laterality Date  . ABDOMINAL HYSTERECTOMY    . APPLICATION OF A-CELL OF HEAD/NECK N/A 02/21/2017   Procedure: APPLICATION OF A-CELL OF HEAD/NECK;  Surgeon: Wallace Going, DO;  Location: [place];  Service: Plastics;  Laterality: N/A;  . BOTOX INJECTION N/A 07/29/2013   Procedure: BOTOX INJECTION;  Surgeon: Jerene Bears, MD;  Location: [facility];   Service: Gastroenterology;  Laterality: N/A;  . BOTOX INJECTION N/A 05/04/2015   Procedure: BOTOX INJECTION;  Surgeon: Jerene Bears, MD;  Location: [facility];  Service: Gastroenterology;  Laterality: N/A;  . BREAST BIOPSY Left    neg  . BREAST SURGERY Left 2002   bx of skin  . CHOLECYSTECTOMY    . COLONOSCOPY    . ESOPHAGEAL MANOMETRY N/A 12/16/2012   Procedure: ESOPHAGEAL MANOMETRY (EM);  Surgeon: Jerene Bears, MD;  [facility];  Service: Gastroenterology;  Laterality: N/A;  . ESOPHAGOGASTRODUODENOSCOPY (EGD) WITH PROPOFOL N/A 07/29/2013   Procedure: ESOPHAGOGASTRODUODENOSCOPY (EGD) WITH PROPOFOL;  Surgeon: Jerene Bears, MD;  [facility];  Service: Gastroenterology;  Laterality: N/A;  with botox injection  . ESOPHAGOGASTRODUODENOSCOPY (EGD) WITH PROPOFOL N/A 05/04/2015   Procedure: ESOPHAGOGASTRODUODENOSCOPY (EGD) WITH PROPOFOL;  Surgeon: Jerene Bears, MD;  [facility];  Service: Gastroenterology;  Laterality: N/A;  . EYE SURGERY     Catarct surgery 2014  . Eye Surgery AS Child Left   . INCISION AND DRAINAGE OF WOUND N/A 02/21/2017   Procedure: IRRIGATION AND DEBRIDEMENT WOUND NECK;  Surgeon: Wallace Going, DO;  Location: [place];  Service: Plastics;  Laterality: N/A;  . JEJUNOSTOMY FEEDING TUBE     x2 both failed. no longer has  . MULTIPLE TOOTH EXTRACTIONS     2 teeth removed  . POSTERIOR CERVICAL FUSION/FORAMINOTOMY N/A 01/10/2017   Procedure: LAMINECTOMY AND FORAMINOTOMY CERVICAL FOUR-CERVICAL FIVE, CERVICAL FIVE-SIX POSTERIOR CERVICAL  INSTRUMENT FUSION CERVICAL THREE-CERVICAL SEVEN,CERVICAL LAMINECTOMY CERVICAL THREE-CERVICAL SEVEN.;  Surgeon: Eustace Moore, MD;  Location: [place];  Service: Neurosurgery;  Laterality: N/A;  posterior  . TRACHEOSTOMY  1996   [facility] at [facility], Dr. Kathyrn Sheriff  . TUBAL LIGATION    . VIDEO BRONCHOSCOPY Bilateral 11/20/2012   Procedure: VIDEO BRONCHOSCOPY WITH FLUORO;  Surgeon: Juanito Doom, MD;  Location[facility];   Service: Cardiopulmonary;  Laterality: Bilateral;     (Not in an outpatient encounter)  Allergies  Allergen Reactions  . Asa [Aspirin] Shortness Of Breath, Swelling and Other (See Comments)    Tongue swells  . Bee Venom Anaphylaxis and Shortness Of Breath  . Eggs Or Egg-Derived Products Shortness Of Breath and Other (See Comments)    Patient CAN eat these; just cannot tolerate if contained within a flu vaccine  . Feraheme [Ferumoxytol] Other (See Comments)    Throat squeezing  . Influenza Vaccines Shortness Of Breath and Other (See Comments)    ONLY IF IT CONTAINS EGGS!!!!!  . Shellfish Allergy Anaphylaxis  . Quinolones Swelling and Other (See Comments)    Feet swell  . Ciprofloxacin Swelling and Other (See Comments)    ALL MEDS ENDING IN -FLOXACIN MAKE THE FEET SWELL  . Clarithromycin Nausea Only  . Erythromycin Nausea Only  . Levaquin [Levofloxacin In D5w] Swelling and Other (See Comments)    Feet and legs ache and SWELL  . Peanut-Containing Drug Products Other (See Comments)    Wheezing (boiled or raw peanuts)  . Septra [Sulfamethoxazole-Trimethoprim] Diarrhea  . Telithromycin Nausea Only  . Zanaflex [Tizanidine] Other (See Comments)    Caused the patient to feel "spaced out" and "not well"  . Adhesive [Tape] Rash and Other (See Comments)    Paper works tape works fine   . Epinephrine Palpitations and Other (See Comments)    Also makes the heart race    Family History  Problem Relation Age of Onset  . Asthma Cousin   . COPD Cousin   . Breast cancer Maternal Grandmother 60  . Cancer Maternal Grandmother        breast  . Asthma Father   . Cancer Father        lung  . Kidney cancer Father   . Arrhythmia Father   . Cancer Paternal Uncle        lung  . COPD Paternal Grandfather   . Breast cancer Maternal Aunt 34  . Cancer Maternal Aunt        breast  . Bladder Cancer Neg Hx     Social History   Tobacco Use  . Smoking status: Never Smoker  . Smokeless  tobacco: Never Used  Substance Use Topics  . Alcohol use: Yes    Alcohol/week: 0.0 standard drinks    Comment: occ.  . Drug use: No    ROS: As per history of present illness, otherwise negative  BP (!) 131/58   Pulse 65   Temp 97.8 F (36.6 C) (Oral)   Resp 15   Ht 5' 3.5" (1.613 m)   Wt 48.3 kg   SpO2 100%   BMI 18.55 kg/m  Gen: awake, alert, NAD HEENT: anicteric, op clear Neck: trach in place CV: RRR, no mrg Pulm: CTA b/l Abd: soft, NT/ND, +BS throughout Ext: no c/c/e Neuro: nonfocal   RELEVANT LABS AND IMAGING: CBC    Component Value Date/Time   WBC 5.2 12/11/2017 1028   RBC 4.05 12/11/2017 1028   HGB 12.6 12/11/2017 1028  HGB 12.6 02/17/2014 1303   HCT 37.9 12/11/2017 1028   HCT 37.9 02/17/2014 1303   PLT 208 12/11/2017 1028   PLT 208 02/17/2014 1303   MCV 93.7 12/11/2017 1028   MCV 97 02/17/2014 1303   MCH 31.2 12/11/2017 1028   MCHC 33.3 12/11/2017 1028   RDW 18.1 (H) 12/11/2017 1028   RDW 12.5 02/17/2014 1303   LYMPHSABS 1.4 12/11/2017 1028   LYMPHSABS 1.2 02/17/2014 1303   MONOABS 0.5 12/11/2017 1028   MONOABS 0.3 02/17/2014 1303   EOSABS 0.4 12/11/2017 1028   EOSABS 0.2 02/17/2014 1303   BASOSABS 0.1 12/11/2017 1028   BASOSABS 0.0 02/17/2014 1303    CMP     Component Value Date/Time   NA 143 02/04/2018 1017   K 3.6 02/04/2018 1017   K 3.6 04/03/2012 1653   CL 104 02/04/2018 1017   CO2 32 02/04/2018 1017   GLUCOSE 87 02/04/2018 1017   BUN 12 02/04/2018 1017   CREATININE 0.99 02/04/2018 1017   CREATININE 0.94 10/30/2016 1135   CALCIUM 9.7 02/04/2018 1017   PROT 6.3 06/22/2017 1514   ALBUMIN 3.5 06/22/2017 1514   AST 20 06/22/2017 1514   ALT 14 06/22/2017 1514   ALKPHOS 110 06/22/2017 1514   BILITOT 0.4 06/22/2017 1514   GFRNONAA 53 (L) 02/08/2017 1935   GFRAA >60 02/08/2017 1935    ASSESSMENT/PLAN: 67 year old female with history of chronic dysphagia, type III achalasia, vocal cord dysfunction with chronic aspiration with  permanent tracheostomy, history of iron deficiency anemia who presents for outpatient upper endoscopy.    1. Type III achalasia --repeat upper endoscopy with Botox injection today.  Previously successful.  This will be her third Botox treatment.  The nature of the procedure, as well as the risks, benefits, and alternatives were carefully and thoroughly reviewed with the patient. Ample time for discussion and questions allowed. The patient understood, was satisfied, and agreed to proceed.

## 2018-02-18 NOTE — Anesthesia Preprocedure Evaluation (Signed)
Anesthesia Evaluation  Patient identified by MRN, date of birth, ID band Patient awake    Reviewed: Allergy & Precautions, NPO status , Patient's Chart, lab work & pertinent test results  Airway Mallampati: Trach  TM Distance: >3 FB Neck ROM: Limited    Dental  (+) Teeth Intact   Pulmonary    breath sounds clear to auscultation       Cardiovascular  Rhythm:Regular Rate:Normal     Neuro/Psych    GI/Hepatic   Endo/Other    Renal/GU      Musculoskeletal   Abdominal   Peds  Hematology   Anesthesia Other Findings   Reproductive/Obstetrics                             Anesthesia Physical Anesthesia Plan  ASA: III  Anesthesia Plan: MAC   Post-op Pain Management:    Induction:   PONV Risk Score and Plan: Ondansetron and Propofol infusion  Airway Management Planned: Natural Airway, Tracheostomy and Simple Face Mask  Additional Equipment:   Intra-op Plan:   Post-operative Plan:   Informed Consent: I have reviewed the patients History and Physical, chart, labs and discussed the procedure including the risks, benefits and alternatives for the proposed anesthesia with the patient or authorized representative who has indicated his/her understanding and acceptance.     Plan Discussed with: CRNA and Anesthesiologist  Anesthesia Plan Comments:         Anesthesia Quick Evaluation

## 2018-02-18 NOTE — Anesthesia Postprocedure Evaluation (Signed)
Anesthesia Post Note  Patient: KITARA HEBB  Procedure(s) Performed: ESOPHAGOGASTRODUODENOSCOPY (EGD) WITH PROPOFOL (N/A ) BOTOX INJECTION (N/A )     Patient location during evaluation: PACU Anesthesia Type: MAC Level of consciousness: awake and alert Pain management: pain level controlled Vital Signs Assessment: post-procedure vital signs reviewed and stable Respiratory status: spontaneous breathing, nonlabored ventilation, respiratory function stable and patient connected to nasal cannula oxygen Cardiovascular status: stable and blood pressure returned to baseline Postop Assessment: no apparent nausea or vomiting Anesthetic complications: no    Last Vitals:  Vitals:   02/18/18 0930 02/18/18 0940  BP: (!) 120/54 (!) 131/56  Pulse: 61 (!) 59  Resp: 17 12  Temp:    SpO2: 100% 100%    Last Pain:  Vitals:   02/18/18 0940  TempSrc:   PainSc: 0-No pain                 Francessca Friis COKER

## 2018-02-18 NOTE — Transfer of Care (Signed)
Immediate Anesthesia Transfer of Care Note  Patient: MALINA GEERS  Procedure(s) Performed: ESOPHAGOGASTRODUODENOSCOPY (EGD) WITH PROPOFOL (N/A ) BOTOX INJECTION (N/A )  Patient Location: PACU  Anesthesia Type:MAC  Level of Consciousness: sedated  Airway & Oxygen Therapy: Patient Spontanous Breathing and Patient connected to face mask oxygen  Post-op Assessment: Report given to RN and Post -op Vital signs reviewed and stable  Post vital signs: Reviewed and stable  Last Vitals:  Vitals Value Taken Time  BP 102/37 02/18/2018  9:16 AM  Temp    Pulse 66 02/18/2018  9:17 AM  Resp 25 02/18/2018  9:17 AM  SpO2 100 % 02/18/2018  9:17 AM  Vitals shown include unvalidated device data.  Last Pain:  Vitals:   02/18/18 0800  TempSrc: Oral  PainSc: 0-No pain         Complications: No apparent anesthesia complications

## 2018-02-19 ENCOUNTER — Encounter: Payer: Self-pay | Admitting: Family Medicine

## 2018-02-20 ENCOUNTER — Encounter (HOSPITAL_COMMUNITY): Payer: Self-pay | Admitting: Internal Medicine

## 2018-02-26 ENCOUNTER — Ambulatory Visit (INDEPENDENT_AMBULATORY_CARE_PROVIDER_SITE_OTHER): Payer: Medicare Other

## 2018-02-26 DIAGNOSIS — E538 Deficiency of other specified B group vitamins: Secondary | ICD-10-CM | POA: Diagnosis not present

## 2018-02-26 MED ORDER — CYANOCOBALAMIN 1000 MCG/ML IJ SOLN: 1000.0000 ug | Freq: Once | INTRAMUSCULAR | 0 refills | Status: DC

## 2018-02-26 MED ORDER — CYANOCOBALAMIN 1000 MCG/ML IJ SOLN
1000.0000 ug | Freq: Once | INTRAMUSCULAR | Status: AC
  Administered 2018-02-26: 1000 ug via INTRAMUSCULAR

## 2018-02-26 NOTE — Progress Notes (Signed)
Pt was seen today for B-12 shot was given in RD IM. Pt tolerated well.

## 2018-03-04 ENCOUNTER — Other Ambulatory Visit: Payer: Self-pay | Admitting: Neurological Surgery

## 2018-03-06 ENCOUNTER — Telehealth: Payer: Self-pay | Admitting: Pulmonary Disease

## 2018-03-06 ENCOUNTER — Telehealth: Payer: Self-pay | Admitting: Cardiovascular Disease

## 2018-03-06 NOTE — Telephone Encounter (Signed)
° °  [facility] Pre-operative Risk Assessment    Request for surgical clearance:  1. What type of surgery is being performed? Bilateral l4-5 lumbar laminectomy   2. When is this surgery scheduled? 04/18/18  3. What type of clearance is required (medical clearance vs. Pharmacy clearance to hold med vs. Both)? Both   4. Are there any medications that need to be held prior to surgery and how long? Please advise   5. Practice name and name of physician performing surgery?  [facility]  6. What is your office phone number    7.   What is your office fax number [phone]  8.   Anesthesia type (None, local, MAC, general) ? General    Anne [name] 03/06/2018, 11:00 AM  _________________________________________________________________   (provider comments below)

## 2018-03-06 NOTE — Telephone Encounter (Signed)
   [facility] Pre-operative Risk Assessment    Request for surgical clearance:  1. What type of surgery is being performed? Bilatera L4-5  When is this surgery scheduled? 04/18/2018 2. What type of clearance is required (medical clearance vs. Pharmacy clearance to hold med vs. Both)? Not listed  3. Are there any medications that need to be held prior to surgery and how long?not listed  4. Practice name and name of physician performing surgery? Dr. Sherley Bounds  What is your office phone number [phone]  7.   What is your office fax [phone]  8.   Anesthesia type (None, local, MAC, general) ? general   Anne [name] 03/06/2018, 3:54 PM  _________________________________________________________________   (provider comments below)

## 2018-03-06 NOTE — Telephone Encounter (Signed)
Received request directly from [facility]. Place in Dr. Alva Garnet box for completion.

## 2018-03-07 ENCOUNTER — Telehealth: Payer: Self-pay

## 2018-03-07 NOTE — Telephone Encounter (Signed)
Preoperative clearance faxed back to [facility]. Fax confirmation received.

## 2018-03-08 ENCOUNTER — Other Ambulatory Visit: Payer: Self-pay | Admitting: Family Medicine

## 2018-03-08 DIAGNOSIS — Z1231 Encounter for screening mammogram for malignant neoplasm of breast: Secondary | ICD-10-CM

## 2018-03-12 ENCOUNTER — Other Ambulatory Visit: Payer: Self-pay | Admitting: Family Medicine

## 2018-03-12 ENCOUNTER — Other Ambulatory Visit: Payer: Medicare Other

## 2018-03-12 NOTE — Telephone Encounter (Signed)
Low risk

## 2018-03-12 NOTE — Telephone Encounter (Signed)
Controlled substance database reviewed. Sent to pharmacy.   

## 2018-03-13 ENCOUNTER — Ambulatory Visit
Admission: RE | Admit: 2018-03-13 | Discharge: 2018-03-13 | Disposition: A | Discharge status: Home / Self Care | Payer: Medicare Other | Attending: Pulmonary Disease | Admitting: Pulmonary Disease

## 2018-03-13 ENCOUNTER — Inpatient Hospital Stay: Payer: Medicare Other | Attending: Hematology and Oncology

## 2018-03-13 ENCOUNTER — Ambulatory Visit (INDEPENDENT_AMBULATORY_CARE_PROVIDER_SITE_OTHER): Payer: Medicare Other | Admitting: Pulmonary Disease

## 2018-03-13 ENCOUNTER — Other Ambulatory Visit: Payer: Self-pay

## 2018-03-13 ENCOUNTER — Ambulatory Visit
Admission: RE | Admit: 2018-03-13 | Discharge: 2018-03-13 | Disposition: A | Discharge status: Home / Self Care | Payer: Medicare Other | Source: Ambulatory Visit | Attending: Pulmonary Disease | Admitting: Pulmonary Disease

## 2018-03-13 ENCOUNTER — Encounter: Payer: Self-pay | Admitting: Pulmonary Disease

## 2018-03-13 VITALS — BP 134/80 | HR 64 | Resp 16 | Ht 63.5 in | Wt 104.0 lb

## 2018-03-13 DIAGNOSIS — D509 Iron deficiency anemia, unspecified: Secondary | ICD-10-CM | POA: Insufficient documentation

## 2018-03-13 DIAGNOSIS — J8410 Pulmonary fibrosis, unspecified: Secondary | ICD-10-CM | POA: Diagnosis not present

## 2018-03-13 DIAGNOSIS — Z930 Tracheostomy status: Secondary | ICD-10-CM

## 2018-03-13 DIAGNOSIS — J4530 Mild persistent asthma, uncomplicated: Secondary | ICD-10-CM | POA: Diagnosis not present

## 2018-03-13 LAB — CBC WITH DIFFERENTIAL/PLATELET
Abs Immature Granulocytes: 0.01 10*3/uL (ref 0.00–0.07)
Basophils Absolute: 0 10*3/uL (ref 0.0–0.1)
Basophils Relative: 1 %
Eosinophils Absolute: 0.3 10*3/uL (ref 0.0–0.5)
Eosinophils Relative: 6 %
HCT: 41.2 % (ref 36.0–46.0)
Hemoglobin: 13.9 g/dL (ref 12.0–15.0)
Immature Granulocytes: 0 %
Lymphocytes Relative: 32 %
Lymphs Abs: 1.5 10*3/uL (ref 0.7–4.0)
MCH: 31.8 pg (ref 26.0–34.0)
MCHC: 33.7 g/dL (ref 30.0–36.0)
MCV: 94.3 fL (ref 80.0–100.0)
Monocytes Absolute: 0.5 10*3/uL (ref 0.1–1.0)
Monocytes Relative: 10 %
Neutro Abs: 2.4 10*3/uL (ref 1.7–7.7)
Neutrophils Relative %: 51 %
Platelets: 211 10*3/uL (ref 150–400)
RBC: 4.37 MIL/uL (ref 3.87–5.11)
RDW: 12.1 % (ref 11.5–15.5)
WBC: 4.7 10*3/uL (ref 4.0–10.5)
nRBC: 0 % (ref 0.0–0.2)

## 2018-03-13 LAB — FERRITIN: Ferritin: 213 ng/mL (ref 11–307)

## 2018-03-13 NOTE — Patient Instructions (Addendum)
Continue montelukast (Singulair) daily Continue albuterol inhaler as needed Chest x-ray today.  I will call you with any important findings Follow-up in 6 months.  Call sooner if needed

## 2018-03-13 NOTE — Progress Notes (Signed)
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 No frank honeycombing. Findings have progressed since  2014. 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  INTERVAL HISTORY: Last visit 07/17.  Course of Z-Pak and prednisone called in by Dr. Mortimer Fries in October  SUBJ: This is a scheduled follow-up.  There have been no major events or changes in her overall status since last visit.  She continues to have occasional chest tightness which responds to albuterol.  She is using albuterol approximately 2 times per week.  She remains on montelukast.  She has no significant cough or mucus production.  She denies chest pain, fever, purulent sputum, hemoptysis, lower extremity edema, calf tenderness.  She is presently being treated for iron deficiency anemia by Dr. Mike Gip.  OBJ: Vitals:   03/13/18 0958 03/13/18 0959  BP:  134/80  Pulse:  64  Resp: 16   SpO2:  99%  Weight: 104 lb (47.2 kg)   Height: 5' 3.5" (1.613 m)   Room air  Gen: NAD HEENT: NCAT, sclera white Neck: No JVD, tracheostomy site clean Lungs: breath sounds moderately diminished without wheezes or other adventitious sounds Cardiovascular: RRR, no murmurs Abdomen: Soft, nontender, normal BS Ext: without clubbing, cyanosis, edema Neuro: grossly intact Skin: Limited exam, no lesions noted   DATA: BMP Latest Ref Rng & Units 02/04/2018 11/01/2017 06/22/2017  Glucose 70 - 99 mg/dL 87 - 106(H)  BUN 6 - 23 mg/dL 12 - 15  Creatinine 0.40 - 1.20 mg/dL 0.99 - 0.93  Sodium 135 - 145 mEq/L 143 - 140  Potassium  3.5 - 5.1 mEq/L 3.6 3.7 3.7  Chloride 96 - 112 mEq/L 104 - 106  CO2 19 - 32 mEq/L 32 - 29  Calcium 8.4 - 10.5 mg/dL 9.7 - 8.8   CBC Latest Ref Rng & Units 03/13/2018 12/11/2017 10/15/2017  WBC 4.0 - 10.5 K/uL 4.7 5.2 5.6  Hemoglobin 12.0 - 15.0 g/dL 13.9 12.6 11.1(L)  Hematocrit 36.0 - 46.0 % 41.2 37.9 33.7(L)  Platelets 150 - 400 K/uL 211 208 262.0    CXR: Pending  IMPRESSION: 1) chronic, 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  I will call her with any important findings Follow-up in 6 months.  Call sooner if needed   Merton Border, MD PCCM service Mobile [phone] Pager [phone] 03/13/2018 11:05 AM

## 2018-03-14 NOTE — Telephone Encounter (Signed)
Clearance successfully faxed to [phone]

## 2018-03-18 ENCOUNTER — Telehealth: Payer: Self-pay | Admitting: Pulmonary Disease

## 2018-03-18 NOTE — Telephone Encounter (Signed)
Spoke to patient, let her know that if her x-ray results were abnormal, Dr. Alva Garnet would let her know. Patient aware.

## 2018-03-19 ENCOUNTER — Other Ambulatory Visit: Payer: Self-pay | Admitting: Family Medicine

## 2018-04-02 ENCOUNTER — Ambulatory Visit (INDEPENDENT_AMBULATORY_CARE_PROVIDER_SITE_OTHER): Payer: Medicare Other

## 2018-04-02 DIAGNOSIS — E538 Deficiency of other specified B group vitamins: Secondary | ICD-10-CM

## 2018-04-02 MED ORDER — CYANOCOBALAMIN 1000 MCG/ML IJ SOLN
1000.0000 ug | Freq: Once | INTRAMUSCULAR | Status: AC
  Administered 2018-04-02: 1000 ug via INTRAMUSCULAR

## 2018-04-02 NOTE — Progress Notes (Signed)
Pt was seen today for a B-12 shot given IM in the RD. Pt tolerated well.

## 2018-04-09 ENCOUNTER — Encounter: Payer: Self-pay | Admitting: Family Medicine

## 2018-04-09 ENCOUNTER — Ambulatory Visit (INDEPENDENT_AMBULATORY_CARE_PROVIDER_SITE_OTHER): Payer: Medicare Other | Admitting: Family Medicine

## 2018-04-09 VITALS — BP 102/60 | HR 62 | Temp 97.7°F | Resp 18 | Ht 63.0 in | Wt 104.8 lb

## 2018-04-09 DIAGNOSIS — J4521 Mild intermittent asthma with (acute) exacerbation: Secondary | ICD-10-CM | POA: Diagnosis not present

## 2018-04-09 DIAGNOSIS — R0989 Other specified symptoms and signs involving the circulatory and respiratory systems: Secondary | ICD-10-CM | POA: Diagnosis not present

## 2018-04-09 DIAGNOSIS — R059 Cough, unspecified: Secondary | ICD-10-CM

## 2018-04-09 DIAGNOSIS — J029 Acute pharyngitis, unspecified: Secondary | ICD-10-CM | POA: Diagnosis not present

## 2018-04-09 DIAGNOSIS — R0981 Nasal congestion: Secondary | ICD-10-CM

## 2018-04-09 DIAGNOSIS — R05 Cough: Secondary | ICD-10-CM | POA: Diagnosis not present

## 2018-04-09 MED ORDER — GUAIFENESIN-CODEINE 100-10 MG/5ML PO SOLN: 5.0000 mL | Freq: Three times a day (TID) | ORAL | 0 refills | Status: DC | PRN

## 2018-04-09 MED ORDER — METHYLPREDNISOLONE 4 MG PO TBPK: ORAL_TABLET | ORAL | 0 refills | Status: DC

## 2018-04-09 MED ORDER — DOXYCYCLINE HYCLATE 100 MG PO TABS: 100.0000 mg | ORAL_TABLET | Freq: Two times a day (BID) | ORAL | 0 refills | Status: DC

## 2018-04-09 NOTE — Progress Notes (Signed)
Subjective:    Patient ID: Anne [name], female    DOB: 1950/11/30, 68 y.o.   MRN: [id number]  HPI   Patient presents to clinic complaining of cough, chest congestion, sore throat and nasal congestion for the past 4 to 5 days.  Patient is concerned she could possibly have strep throat and also is concerned that due to her asthma she could have a pneumonia or respiratory infection.  Denies fever or chills.  Denies chest pain.  Does have some wheezing with cough.  Denies nausea/vomiting or diarrhea.  Patient does have albuterol inhaler at home, has been using a couple puffs 2-3 times a day the past 2 days.  Patient Active Problem List   Diagnosis Date Noted  . Low back pain 02/07/2018  . Iron deficiency anemia 12/11/2017  . Mild persistent asthma 10/10/2017  . Palpitations 08/03/2017  . Bilateral leg edema 08/03/2017  . Anxiety 08/03/2017  . Current long-term use of postmenopausal hormone replacement therapy 03/19/2017  . Wound dehiscence, surgical 02/08/2017  . S/P cervical spinal fusion 01/10/2017  . Kidney lesion 12/13/2016  . Migraine 09/12/2016  . Esophageal dysphagia 06/19/2016  . Skin nodule 01/10/2016  . Hypokalemia 01/10/2016  . Dysuria 12/15/2015  . Degenerative arthritis of cervical spine 10/28/2015  . Gastritis   . B12 deficiency 03/15/2015  . Shingles 11/20/2014  . Abdominal bloating 09/03/2014  . Slow transit constipation 09/03/2014  . Menopausal symptom 09/03/2014  . Vocal cord dysfunction 07/15/2014  . History of TIA (transient ischemic attack) 06/03/2014  . Sinusitis 04/30/2014  . Chronic pain syndrome 04/28/2014  . Fatigue 09/30/2013  . Hypertensive lower esophageal sphincter 07/29/2013  . Achalasia of esophagus 07/29/2013  . Edema 07/01/2013  . Chronic pulmonary aspiration 06/06/2013  . Insomnia 12/13/2012  . Other and unspecified hyperlipidemia 12/13/2012  . Anemia 08/13/2012  . Pulmonary fibrosis ([place]) 08/05/2012  . 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 Smoking status: Never Smoker  . Smokeless tobacco: Never Used  Substance Use Topics  . Alcohol use: Yes    Alcohol/week: 0.0 standard drinks    Comment: occ.    Review of Systems  Constitutional: Negative for chills, fatigue and fever.  HENT: +congestion, runny nose and sore throat.   Eyes: Negative.   Respiratory: +cough, congestion in chest. Negative for shortness of breath and wheezing.   Cardiovascular: Negative for chest pain, palpitations and leg swelling.  Gastrointestinal: Negative for abdominal pain, diarrhea, nausea and vomiting.  Genitourinary: Negative for dysuria, frequency and urgency.  Musculoskeletal: Negative for arthralgias and myalgias.  Skin: Negative for color change, pallor and rash.  Neurological: Negative for syncope, light-headedness and headaches.  Psychiatric/Behavioral: The patient is not nervous/anxious.       Objective:   Physical Exam Vitals signs and nursing note reviewed.  Constitutional:      General: She is not in acute distress.    Appearance: She is well-developed. She is not toxic-appearing.  HENT:     Head: Normocephalic and atraumatic.     Ears:     Comments: Fullness bilateral TMs    Nose: Congestion and rhinorrhea present.     Mouth/Throat:     Mouth: Mucous membranes are moist.     Pharynx: Posterior oropharyngeal erythema present. No oropharyngeal exudate.     Comments: Has had tonsillectomy Eyes:     Conjunctiva/sclera: Conjunctivae normal.  Neck:     Musculoskeletal: Neck supple.  Cardiovascular:     Rate and  Rhythm: Normal rate and regular rhythm.  Pulmonary:     Effort: Pulmonary effort is normal.     Breath sounds: Wheezing (faint wheezes upper lobes) present.     Comments: Harsh raspy cough Lymphadenopathy:     Cervical: No cervical adenopathy.  Skin:    General: Skin is warm and dry.     Coloration: Skin is not pale.  Neurological:     Mental Status: She is alert  and oriented to person, place, and time.  Psychiatric:        Mood and Affect: Mood normal.        Behavior: Behavior normal.     Vitals:   04/09/18 1601  BP: 102/60  Pulse: 62  Resp: 18  Temp: 97.7 F (36.5 C)  SpO2: 97%      Assessment & Plan:   Cough, chest congestion, sore throat, nasal congestion, mild intermittent asthma with exacerbation - due to cough and chest congestion patient will take steroid course and has been advised to continue using her inhaler at home.  Patient also has DuoNeb inhaler solution that she can use as needed as well.  Patient reports history of getting sick quickly with a respiratory infection/pneumonia, so we will cover her with doxycycline to help prevent this due to her asthma history.  Rapid strep is negative in clinic.  Patient advised to rest, increase fluid intake, do good handwashing.  She will use Robitussin with codeine if needed for cough suppression, also advised Mucinex over-the-counter is a good choice for cough medication that she can use during the daytime hours.  Offered follow-up in 1 to 2 weeks to make sure she is improving, patient declines and states she will let us know if she is not feeling better.

## 2018-04-15 ENCOUNTER — Inpatient Hospital Stay (HOSPITAL_COMMUNITY): Admission: RE | Admit: 2018-04-15 | Payer: Medicare Other | Source: Ambulatory Visit

## 2018-04-18 ENCOUNTER — Encounter (HOSPITAL_COMMUNITY): Admission: RE | Payer: Self-pay | Source: Ambulatory Visit

## 2018-04-18 ENCOUNTER — Ambulatory Visit (HOSPITAL_COMMUNITY): Admission: RE | Admit: 2018-04-18 | Payer: Medicare Other | Source: Ambulatory Visit | Admitting: Neurological Surgery

## 2018-04-18 SURGERY — LUMBAR LAMINECTOMY/DECOMPRESSION MICRODISCECTOMY 1 LEVEL
Anesthesia: General | Site: Back | Laterality: Bilateral

## 2018-04-19 ENCOUNTER — Other Ambulatory Visit: Payer: Self-pay | Admitting: Neurological Surgery

## 2018-04-26 ENCOUNTER — Telehealth: Payer: Self-pay | Admitting: Cardiovascular Disease

## 2018-04-26 NOTE — Telephone Encounter (Signed)
° °  [facility] Pre-operative Risk Assessment    Request for surgical clearance:  1. What type of surgery is being performed? L4-5 Lumba Laminectomy    2. When is this surgery scheduled? 05/13/18  3. What type of clearance is required (medical clearance vs. Pharmacy clearance to hold med vs. Both)? Both   4. Are there any medications that need to be held prior to surgery and how long?please address plavix   5. Practice name and name of physician performing surgery? [facility]  6. What is your office phone number    7.   What is your office fax number [phone]  8.   Anesthesia type (None, local, MAC, general) ? General    [name] 04/26/2018, 2:59 PM  _________________________________________________________________   (provider comments below)

## 2018-04-26 NOTE — Telephone Encounter (Signed)
Patient last seen 09/2017-follow up as needed.

## 2018-04-29 NOTE — Telephone Encounter (Signed)
Clearance successfully faxed to [phone]

## 2018-04-29 NOTE — Telephone Encounter (Signed)
Low risk from a cardiac standpoint.  Plavix was not prescribed by me.  I think she is on it for stroke prophylaxis.  Usually I recommend holding this 7 days before neurosurgical procedures.

## 2018-05-07 ENCOUNTER — Encounter (HOSPITAL_COMMUNITY): Payer: Self-pay

## 2018-05-07 ENCOUNTER — Other Ambulatory Visit: Payer: Self-pay

## 2018-05-07 ENCOUNTER — Encounter (HOSPITAL_COMMUNITY)
Admission: RE | Admit: 2018-05-07 | Discharge: 2018-05-07 | Disposition: A | Discharge status: Home / Self Care | Payer: Medicare Other | Source: Ambulatory Visit | Attending: Neurological Surgery | Admitting: Neurological Surgery

## 2018-05-07 ENCOUNTER — Ambulatory Visit (INDEPENDENT_AMBULATORY_CARE_PROVIDER_SITE_OTHER): Payer: Medicare Other

## 2018-05-07 DIAGNOSIS — Z01812 Encounter for preprocedural laboratory examination: Secondary | ICD-10-CM | POA: Diagnosis not present

## 2018-05-07 DIAGNOSIS — E538 Deficiency of other specified B group vitamins: Secondary | ICD-10-CM | POA: Diagnosis not present

## 2018-05-07 LAB — BASIC METABOLIC PANEL
Anion gap: 11 (ref 5–15)
BUN: 13 mg/dL (ref 8–23)
CO2: 26 mmol/L (ref 22–32)
Calcium: 9.6 mg/dL (ref 8.9–10.3)
Chloride: 103 mmol/L (ref 98–111)
Creatinine, Ser: 1.01 mg/dL — ABNORMAL HIGH (ref 0.44–1.00)
GFR calc Af Amer: >60 mL/min (ref >60)
GFR calc non Af Amer: 57 mL/min — ABNORMAL LOW (ref >60)
Glucose, Bld: 84 mg/dL (ref 70–99)
Potassium: 3.2 mmol/L — ABNORMAL LOW (ref 3.5–5.1)
Sodium: 140 mmol/L (ref 135–145)

## 2018-05-07 LAB — CBC
HCT: 42 % (ref 36.0–46.0)
Hemoglobin: 13.6 g/dL (ref 12.0–15.0)
MCH: 31.5 pg (ref 26.0–34.0)
MCHC: 32.4 g/dL (ref 30.0–36.0)
MCV: 97.2 fL (ref 80.0–100.0)
Platelets: 221 10*3/uL (ref 150–400)
RBC: 4.32 MIL/uL (ref 3.87–5.11)
RDW: 12.1 % (ref 11.5–15.5)
WBC: 7.2 10*3/uL (ref 4.0–10.5)
nRBC: 0 % (ref 0.0–0.2)

## 2018-05-07 LAB — SURGICAL PCR SCREEN
MRSA, PCR: NEGATIVE
Staphylococcus aureus: NEGATIVE

## 2018-05-07 MED ORDER — CYANOCOBALAMIN 1000 MCG/ML IJ SOLN
1000.0000 ug | Freq: Once | INTRAMUSCULAR | Status: AC
  Administered 2018-05-07: 1000 ug via INTRAMUSCULAR

## 2018-05-07 NOTE — Progress Notes (Signed)
Pt came in for monthly b12 injection. Given in RD per request because she just had cortisone injection in left. Tolerated well. No complaints or concerns at this time.

## 2018-05-07 NOTE — Pre-Procedure Instructions (Signed)
Anne [name]  05/07/2018      [facility] - [place], [place] [address] Phone: [phone] Fax: [phone]    Your procedure is scheduled on February 17  Report to [facility] at 0830 A.M.  Call this number if you have problems the morning of surgery:  [phone]   Remember:  Do not eat or drink after midnight.     Take these medicines the morning of surgery with A SIP OF WATER  acetaminophen (TYLENOL) albuterol (PROVENTIL HFA;VENTOLIN HFA) ALPRAZolam (XANAX)  cetirizine (ZYRTEC)  estradiol (ESTRACE) fluticasone (FLONASE) ipratropium-albuterol (DUONEB) metoprolol succinate (TOPROL XL)  pantoprazole (PROTONIX)   7 days prior to surgery STOP taking any Aspirin (unless otherwise instructed by your surgeon), Aleve, Naproxen, Ibuprofen, Motrin, Advil, Goody's, BC's, all herbal medications, fish oil, and all vitamins.  Follow your surgeon's instructions on when to stop Plavix.  If no instructions were given by your surgeon then you will need to call the office to get those instructions.       Do not wear jewelry, make-up or nail polish.  Do not wear lotions, powders, or perfumes, or deodorant.  Do not shave 48 hours prior to surgery.   Do not bring valuables to the hospital.  [facility] is not responsible for any belongings or valuables.  Contacts, dentures or bridgework may not be worn into surgery.  Leave your suitcase in the car.  After surgery it may be brought to your room.  For patients admitted to the hospital, discharge time will be determined by your treatment team.  Patients discharged the day of surgery will not be allowed to drive home.    Special instructions:   [facility] [place]- Preparing For Surgery  Before surgery, you can play an important role. Because skin is not sterile, your skin needs to be as free of germs as possible. You can reduce the number of germs on your skin by  washing with CHG (chlorahexidine gluconate) Soap before surgery.  CHG is an antiseptic cleaner which kills germs and bonds with the skin to continue killing germs even after washing.    Oral Hygiene is also important to reduce your risk of infection.  Remember - BRUSH YOUR TEETH THE MORNING OF SURGERY WITH YOUR REGULAR TOOTHPASTE  Please do not use if you have an allergy to CHG or antibacterial soaps. If your skin becomes reddened/irritated stop using the CHG.  Do not shave (including legs and underarms) for at least 48 hours prior to first CHG shower. It is OK to shave your face.  Please follow these instructions carefully.   1. Shower the NIGHT BEFORE SURGERY and the MORNING OF SURGERY with CHG.   2. If you chose to wash your hair, wash your hair first as usual with your normal shampoo.  3. After you shampoo, rinse your hair and body thoroughly to remove the shampoo.  4. Use CHG as you would any other liquid soap. You can apply CHG directly to the skin and wash gently with a scrungie or a clean washcloth.   5. Apply the CHG Soap to your body ONLY FROM THE NECK DOWN.  Do not use on open wounds or open sores. Avoid contact with your eyes, ears, mouth and genitals (private parts). Wash Face and genitals (private parts)  with your normal soap.  6. Wash thoroughly, paying special attention to the area where your surgery will be performed.  7. Thoroughly rinse your body with warm water from the neck down.  8. DO NOT shower/wash with your normal soap after using and rinsing off the CHG Soap.  9. Pat yourself dry with a CLEAN TOWEL.  10. Wear CLEAN PAJAMAS to bed the night before surgery, wear comfortable clothes the morning of surgery  11. Place CLEAN SHEETS on your bed the night of your first shower and DO NOT SLEEP WITH PETS.    Day of Surgery:  Do not apply any deodorants/lotions.  Please wear clean clothes to the hospital/surgery center.   Remember to brush your teeth WITH YOUR  REGULAR TOOTHPASTE.    Please read over the following fact sheets that you were given.

## 2018-05-07 NOTE — Progress Notes (Signed)
I have reviewed the above note and agree.  Libra Gatz, M.D.  

## 2018-05-07 NOTE — Progress Notes (Signed)
PCP - Tommi Rumps Cardiologist - [place]  Chest x-ray - 03/13/18 EKG - 10/09/17 Stress Test - 2015 ECHO - 2015 Cardiac Cath denies-   Blood Thinner Instructions: last dose of plavix on 05/05/18 Aspirin Instructions:  Anesthesia review: yes, pulmonary history Patient has had trach for 20 years size 4 cuffless  Patient denies shortness of breath, fever, cough and chest pain at PAT appointment   Patient verbalized understanding of instructions that were given to them at the PAT appointment. Patient was also instructed that they will need to review over the PAT instructions again at home before surgery.

## 2018-05-08 NOTE — Progress Notes (Addendum)
Anesthesia Chart Review:  Case:  [id number] Date/Time:  05/13/18 1015   Procedure:  Laminectomy and Foraminotomy - L4-L5 - bilateral (Bilateral Back)   Anesthesia type:  General   Pre-op diagnosis:  Stenosis   Location:  [facility] OR ROOM 18 / [facility]   Surgeon:  Eustace Moore, MD      DISCUSSION: Patient is a 68 year old female scheduled for the above procedure.   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]), left vocal cord "mechanical" dysfunction, tracheostomy '96 (#4 [place]; placed for recurrent upper airway obstruction related to asthma and left vocal cord "mechanical" dysfunction), childhood murmur, anemia, migraines, suspected TIA (~ '98 left facial droop and ~ '03 dysarthria; reported MRI negative), IBS, jejunostomy feeding tube (doesn't have any longer), dysphagia (eats small amount, little to no meat), achalasia (type III or spastic), video bronchoscopy (11/20/12; negative fungal cultures), left renal nodule 09/2016 (followed by urology), C3-7 lateral mass fusion 60/73/71 complicated by wound dehiscence neck wound, s/p excision of muscle and fascia of 4x8 cm neck wound with muscle advancement, Acell, Wound VAC 02/21/17.  History of chronic tracheostomy X 20 years, size 4 cuffless which has been changed out to a 5.5 mm cuffed ETT for previous neck surgeries--attempted 6.0 on 01/10/17 (see below):  - 02/21/17:  Induction Type: IV induction  Ventilation: Mask ventilation without difficulty  Tube type: Oral  Tube size: 5.5 (re-inforced thru trach with ease) mm  Number of attempts: 1  Airway Equipment and Method: Oral airway and Tracheostomy    - 01/10/17:  Induction Type: IV induction  Tube type: Reinforced  Tube size: 5.5 mm  Number of attempts: 2  Comments: Pt c well established uncuffed trach due to vocal cord dysfunction   Preoxygenated by mask followed by  iv induction. Attempted 6.0 reenforced ett s success 5.5  re-enforced ett placed c ease. =bbs + ETCO2 sutured in   place by Dr. Seward Speck tegaderm and tape used to secure as well    - Pulmonologist Dr. Alva Garnet felt she was "low risk" from a pulmonology standpoint, but will need trach changed to a cuffed tube either by ENT or anesthesia. (Letter scanned under Media tab.)  - Cardiologist Dr. Fletcher Anon wrote on 04/29/18, "Low risk from a cardiac standpoint.  Plavix was not prescribed by me.  I think she is on it for stroke prophylaxis.  Usually I recommend holding this 7 days before neurosurgical procedures." Patient reported last Plavix 05/05/18.  Anesthesiologist to evaluate on the day of surgery and determine definitive anesthesia plan.   VS: BP (!) 119/56 Comment: rechecked  Pulse 61   Temp 36.6 C   Resp 20   Ht 5' 3.5" (1.613 m)   Wt 46.3 kg   SpO2 100%   BMI 17.79 kg/m   PROVIDERS: [name], MD is PCP - Merton Border, MD is pulmonologist. Last visit 03/13/18. 6 month follow-up recommended. Kathlyn Sacramento, MD is cardiologist. Last visit 10/09/17 for evaluation of palpitations. 3 day event monitor ordered and was overall unremarkable. PRN follow-up recommended.  Zenovia Jarred, MD is GI - Baruch Gouty, MD is GU. Ermalinda Barrios, MD is ENT - Nolon Stalls, MD is hematologist. Last visit 12/11/17.   LABS: Labs reviewed: Acceptable for surgery. (all labs ordered are listed, but only abnormal results are displayed)  Labs Reviewed  BASIC METABOLIC PANEL - Abnormal; Notable for the following components:      Result Value  Potassium 3.2 (*)    Creatinine, Ser 1.01 (*)    GFR calc non Af Amer 57 (*)    All other components within normal limits  SURGICAL PCR SCREEN  CBC    IMAGES: CXR 03/13/18: FINDINGS: Bilateral upper lobe scar is worst in the apices and appears unchanged. No consolidative process, pneumothorax or effusion. The chest is hyperexpanded as on the prior studies. Heart size is normal. No acute or focal  bony abnormality. Tracheostomy tube is in good position. IMPRESSION: No acute disease.  Stable compared to prior exams.  MRI abdomen 03/29/17 (ordered by Dr. Pilar Jarvis): IMPRESSION: 1. Stable to minimal increase in size of enhancing lesion arising from the upper pole of left kidney which is suspicious for a small renal neoplasm. 2. Previous cholecystectomy.   EKG: 10/09/17: NSR   CV: 3-day outpatient monitor 10/09/17-10/12/17: Normal sinus rhythm. Average heart rate of 67 bpm. 1 run of SVT lasting only 6 beats with a rate of 126 bpm. Rare PACs. (Results reviewed by Dr. Fletcher Anon who wrote, "Overall, no significant arrhythmia and no need to treat. If palpitations worsen, a small dose beta-blocker can be considered.")  Nuclear stress test 02/03/14 [facility]):  Summary: 1. Pharmacological myocardial perfusion study with no significant ischemia. 2. No significant wall motion abnormality noted. 3. The LV global function was abnormal. 4. EF 37%. 5. There are EKG changes concerning for ischemia at peak stress, 1 mm depressions in V4-6. 6. There is GI uptake artifact noted on this study. 7. Overall, this is a Low risk scan. 8. Consider an echocardiogram to confirm depressed EF.  Echo 02/16/14:  Study Conclusions - Left ventricle: The cavity size was normal. Systolic function was normal. The estimated ejection fraction was in the range of 60% to 65%. Wall motion was normal; there were no regional wall motion abnormalities. Left ventricular diastolic function parameters were normal. - Aortic valve: There was mild regurgitation. - Mitral valve: There was mild regurgitation. - Right ventricle: Systolic function was normal. - Pulmonary arteries: Systolic pressure was within the normal range. Impressions: - Otherwise a normal study.  Carotid U/S 07/05/04:  IMPRESSION:  1)There is noted very slight calcific plaque formation on the RIGHT.  2)No hemodynamically significant stenosis  is seen on either side.  3)Antegrade flow is noted in both vertebrals.    Past Medical History:  Diagnosis Date  . Allergic rhinitis   . Allergy    multiple, mostly aspirin, levaquin and shellfish.  . Anemia   . Asthma   . Cataract 2014   exraction with len implant  . Complication of anesthesia    Breathing problems upon waking up. 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  . Compressed cervical disc   . COPD (chronic obstructive pulmonary disease) ([place])   . CVA (cerebral infarction)   . Dyspnea   . Esophageal dysmotility   . Heart murmur    as child  . History of hiatal hernia   . IBS (irritable bowel syndrome)   . Migraine   . PICC (peripherally inserted central catheter) removal 02/20/2017  . PONV (postoperative nausea and vomiting)   . Problems with swallowing    intermittently  . Pulmonary fibrosis ([place])   . Shingles   . Stroke [facility])    slurred speech, drawn face, imaging normal, occurred twice, [facility]-"TIA" if antything" 02/20/17-no residual effects  . Tracheostomy in place [facility])   . Vocal cord paresis     Past Surgical History:  Procedure Laterality Date  .  ABDOMINAL HYSTERECTOMY    . APPLICATION OF A-CELL OF HEAD/NECK N/A 02/21/2017   Procedure: APPLICATION OF A-CELL OF HEAD/NECK;  Surgeon: Wallace Going, DO;  Location: [place];  Service: Plastics;  Laterality: N/A;  . BOTOX INJECTION N/A 07/29/2013   Procedure: BOTOX INJECTION;  Surgeon: Jerene Bears, MD;  Location: [facility];  Service: Gastroenterology;  Laterality: N/A;  . BOTOX INJECTION N/A 05/04/2015   Procedure: BOTOX INJECTION;  Surgeon: Jerene Bears, MD;  [facility];  Service: Gastroenterology;  Laterality: N/A;  . BOTOX INJECTION N/A 02/18/2018   Procedure: BOTOX INJECTION;  Surgeon: Jerene Bears, MD;  [facility];  Service: Gastroenterology;  Laterality: N/A;  . BREAST BIOPSY Left    neg  . BREAST SURGERY Left 2002   bx of skin  . CHOLECYSTECTOMY     . COLONOSCOPY    . ESOPHAGEAL MANOMETRY N/A 12/16/2012   Procedure: ESOPHAGEAL MANOMETRY (EM);  Surgeon: Jerene Bears, MD;  [facility];  Service: Gastroenterology;  Laterality: N/A;  . ESOPHAGOGASTRODUODENOSCOPY (EGD) WITH PROPOFOL N/A 07/29/2013   Procedure: ESOPHAGOGASTRODUODENOSCOPY (EGD) WITH PROPOFOL;  Surgeon: Jerene Bears, MD;  [facility];  Service: Gastroenterology;  Laterality: N/A;  with botox injection  . ESOPHAGOGASTRODUODENOSCOPY (EGD) WITH PROPOFOL N/A 05/04/2015   Procedure: ESOPHAGOGASTRODUODENOSCOPY (EGD) WITH PROPOFOL;  Surgeon: Jerene Bears, MD;  [facility];  Service: Gastroenterology;  Laterality: N/A;  . ESOPHAGOGASTRODUODENOSCOPY (EGD) WITH PROPOFOL N/A 02/18/2018   Procedure: ESOPHAGOGASTRODUODENOSCOPY (EGD) WITH PROPOFOL;  Surgeon: Jerene Bears, MD;  [facility];  Service: Gastroenterology;  Laterality: N/A;  . EYE SURGERY     Catarct surgery 2014  . Eye Surgery AS Child Left   . INCISION AND DRAINAGE OF WOUND N/A 02/21/2017   Procedure: IRRIGATION AND DEBRIDEMENT WOUND NECK;  Surgeon: Wallace Going, DO;  Location: [place];  Service: Plastics;  Laterality: N/A;  . JEJUNOSTOMY FEEDING TUBE     x2 both failed. no longer has  . MULTIPLE TOOTH EXTRACTIONS     2 teeth removed  . POSTERIOR CERVICAL FUSION/FORAMINOTOMY N/A 01/10/2017   Procedure: LAMINECTOMY AND FORAMINOTOMY CERVICAL FOUR-CERVICAL FIVE, CERVICAL FIVE-SIX POSTERIOR CERVICAL INSTRUMENT FUSION CERVICAL THREE-CERVICAL SEVEN,CERVICAL LAMINECTOMY CERVICAL THREE-CERVICAL SEVEN.;  Surgeon: Eustace Moore, MD;  Location: [place];  Service: Neurosurgery;  Laterality: N/A;  posterior  . TRACHEOSTOMY  1996   done at [facility], Dr. Kathyrn Sheriff  . TUBAL LIGATION    . VIDEO BRONCHOSCOPY Bilateral 11/20/2012   Procedure: VIDEO BRONCHOSCOPY WITH FLUORO;  Surgeon: Juanito Doom, MD;  [facility];  Service: Cardiopulmonary;  Laterality: Bilateral;    MEDICATIONS: .  acetaminophen (TYLENOL) 500 MG tablet  . albuterol (PROVENTIL HFA;VENTOLIN HFA) 108 (90 Base) MCG/ACT inhaler  . ALPRAZolam (XANAX) 0.25 MG tablet  . alum & mag hydroxide-simeth (MAALOX/MYLANTA) 200-200-20 MG/5ML suspension  . cetirizine (ZYRTEC) 10 MG tablet  . chlorpheniramine-HYDROcodone (TUSSIONEX PENNKINETIC ER) 10-8 MG/5ML SUER  . clopidogrel (PLAVIX) 75 MG tablet  . diphenhydrAMINE (BENADRYL) 25 MG tablet  . doxycycline (VIBRA-TABS) 100 MG tablet  . EPINEPHrine (EPIPEN 2-PAK) 0.3 mg/0.3 mL IJ SOAJ injection  . estradiol (ESTRACE) 1 MG tablet  . fluticasone (FLONASE) 50 MCG/ACT nasal spray  . furosemide (LASIX) 20 MG tablet  . guaiFENesin-codeine 100-10 MG/5ML syrup  . ibuprofen (ADVIL,MOTRIN) 200 MG tablet  . ipratropium-albuterol (DUONEB) 0.5-2.5 (3) MG/3ML SOLN  . methylPREDNISolone (MEDROL DOSEPAK) 4 MG TBPK tablet  . metoprolol succinate (TOPROL XL) 25 MG 24 hr tablet  . montelukast (  SINGULAIR) 10 MG tablet  . mupirocin ointment (BACTROBAN) 2 %  . Naphazoline-Pheniramine (OPCON-A) 0.027-0.315 % SOLN  . pantoprazole (PROTONIX) 40 MG tablet  . potassium chloride 20 MEQ/15ML (10%) SOLN  . sodium chloride (OCEAN) 0.65 % SOLN nasal spray  . White Petrolatum-Mineral Oil (LUBRICANT EYE) OINT   No current facility-administered medications for this encounter.     Myra Gianotti, PA-C Surgical Short Stay/Anesthesiology [facility] Phone [phone] [facility] Phone [phone] 05/08/2018 4:05 PM

## 2018-05-08 NOTE — Anesthesia Preprocedure Evaluation (Addendum)
Anesthesia Evaluation  Patient identified by MRN, date of birth, ID band Patient awake    Reviewed: Allergy & Precautions, NPO status , Patient's Chart, lab work & pertinent test results, reviewed documented beta blocker date and time   Airway Mallampati: Trach  TM Distance: >3 FB Neck ROM: Full    Dental  (+) Teeth Intact, Dental Advisory Given   Pulmonary asthma , COPD,    breath sounds clear to auscultation       Cardiovascular  Rhythm:Regular Rate:Normal     Neuro/Psych  Headaches, Anxiety CVA    GI/Hepatic Neg liver ROS, hiatal hernia, GERD  Medicated,  Endo/Other  negative endocrine ROS  Renal/GU negative Renal ROS     Musculoskeletal  (+) Arthritis ,   Abdominal Normal abdominal exam  (+)   Peds  Hematology   Anesthesia Other Findings   Reproductive/Obstetrics                            Anesthesia Physical Anesthesia Plan  ASA: III  Anesthesia Plan: General   Post-op Pain Management:    Induction: Intravenous  PONV Risk Score and Plan: 4 or greater and Ondansetron, Dexamethasone and Treatment may vary due to age or medical condition  Airway Management Planned: Tracheostomy  Additional Equipment: None  Intra-op Plan:   Post-operative Plan: Extubation in OR  Informed Consent: I have reviewed the patients History and Physical, chart, labs and discussed the procedure including the risks, benefits and alternatives for the proposed anesthesia with the patient or authorized representative who has indicated his/her understanding and acceptance.       Plan Discussed with: CRNA  Anesthesia Plan Comments: (See PAT note written 05/08/2018 by Myra Gianotti, PA-C.   Has #4 cuffless trach. 5.5 used in 2018 neck surgeries. )      Anesthesia Quick Evaluation

## 2018-05-09 ENCOUNTER — Ambulatory Visit
Admission: RE | Admit: 2018-05-09 | Discharge: 2018-05-09 | Disposition: A | Discharge status: Home / Self Care | Payer: Medicare Other | Source: Ambulatory Visit | Attending: Family Medicine | Admitting: Family Medicine

## 2018-05-09 DIAGNOSIS — Z1231 Encounter for screening mammogram for malignant neoplasm of breast: Secondary | ICD-10-CM | POA: Diagnosis present

## 2018-05-13 ENCOUNTER — Encounter (HOSPITAL_COMMUNITY): Payer: Self-pay | Admitting: *Deleted

## 2018-05-13 ENCOUNTER — Inpatient Hospital Stay (HOSPITAL_COMMUNITY): Payer: Medicare Other | Admitting: Anesthesiology

## 2018-05-13 ENCOUNTER — Inpatient Hospital Stay (HOSPITAL_COMMUNITY): Payer: Medicare Other | Admitting: Vascular Surgery

## 2018-05-13 ENCOUNTER — Observation Stay (HOSPITAL_COMMUNITY)
Admission: RE | Admit: 2018-05-13 | Discharge: 2018-05-13 | Disposition: A | Discharge status: Home / Self Care | Payer: Medicare Other | Source: Ambulatory Visit | Attending: Neurological Surgery | Admitting: Neurological Surgery

## 2018-05-13 ENCOUNTER — Other Ambulatory Visit: Payer: Self-pay

## 2018-05-13 ENCOUNTER — Inpatient Hospital Stay (HOSPITAL_COMMUNITY): Payer: Medicare Other

## 2018-05-13 ENCOUNTER — Encounter (HOSPITAL_COMMUNITY)
Admission: RE | Disposition: A | Discharge status: Home / Self Care | Payer: Self-pay | Source: Ambulatory Visit | Attending: Neurological Surgery

## 2018-05-13 DIAGNOSIS — Z91010 Allergy to peanuts: Secondary | ICD-10-CM | POA: Insufficient documentation

## 2018-05-13 DIAGNOSIS — M79604 Pain in right leg: Secondary | ICD-10-CM | POA: Insufficient documentation

## 2018-05-13 DIAGNOSIS — M79605 Pain in left leg: Secondary | ICD-10-CM | POA: Diagnosis not present

## 2018-05-13 DIAGNOSIS — Z886 Allergy status to analgesic agent status: Secondary | ICD-10-CM | POA: Insufficient documentation

## 2018-05-13 DIAGNOSIS — Z9889 Other specified postprocedural states: Secondary | ICD-10-CM

## 2018-05-13 DIAGNOSIS — Z8051 Family history of malignant neoplasm of kidney: Secondary | ICD-10-CM | POA: Insufficient documentation

## 2018-05-13 DIAGNOSIS — M1990 Unspecified osteoarthritis, unspecified site: Secondary | ICD-10-CM | POA: Insufficient documentation

## 2018-05-13 DIAGNOSIS — Z882 Allergy status to sulfonamides status: Secondary | ICD-10-CM | POA: Insufficient documentation

## 2018-05-13 DIAGNOSIS — Z91013 Allergy to seafood: Secondary | ICD-10-CM | POA: Insufficient documentation

## 2018-05-13 DIAGNOSIS — J449 Chronic obstructive pulmonary disease, unspecified: Secondary | ICD-10-CM | POA: Insufficient documentation

## 2018-05-13 DIAGNOSIS — K589 Irritable bowel syndrome without diarrhea: Secondary | ICD-10-CM | POA: Insufficient documentation

## 2018-05-13 DIAGNOSIS — Z881 Allergy status to other antibiotic agents status: Secondary | ICD-10-CM | POA: Diagnosis not present

## 2018-05-13 DIAGNOSIS — Z7951 Long term (current) use of inhaled steroids: Secondary | ICD-10-CM | POA: Insufficient documentation

## 2018-05-13 DIAGNOSIS — J8410 Pulmonary fibrosis, unspecified: Secondary | ICD-10-CM | POA: Diagnosis not present

## 2018-05-13 DIAGNOSIS — Z803 Family history of malignant neoplasm of breast: Secondary | ICD-10-CM | POA: Insufficient documentation

## 2018-05-13 DIAGNOSIS — Z79899 Other long term (current) drug therapy: Secondary | ICD-10-CM | POA: Diagnosis not present

## 2018-05-13 DIAGNOSIS — Z419 Encounter for procedure for purposes other than remedying health state, unspecified: Secondary | ICD-10-CM

## 2018-05-13 DIAGNOSIS — M549 Dorsalgia, unspecified: Secondary | ICD-10-CM | POA: Diagnosis not present

## 2018-05-13 DIAGNOSIS — Z801 Family history of malignant neoplasm of trachea, bronchus and lung: Secondary | ICD-10-CM | POA: Insufficient documentation

## 2018-05-13 DIAGNOSIS — K449 Diaphragmatic hernia without obstruction or gangrene: Secondary | ICD-10-CM | POA: Diagnosis not present

## 2018-05-13 DIAGNOSIS — Z90710 Acquired absence of both cervix and uterus: Secondary | ICD-10-CM | POA: Insufficient documentation

## 2018-05-13 DIAGNOSIS — Z9049 Acquired absence of other specified parts of digestive tract: Secondary | ICD-10-CM | POA: Diagnosis not present

## 2018-05-13 DIAGNOSIS — K219 Gastro-esophageal reflux disease without esophagitis: Secondary | ICD-10-CM | POA: Insufficient documentation

## 2018-05-13 DIAGNOSIS — Z981 Arthrodesis status: Secondary | ICD-10-CM | POA: Diagnosis not present

## 2018-05-13 DIAGNOSIS — M48061 Spinal stenosis, lumbar region without neurogenic claudication: Principal | ICD-10-CM | POA: Insufficient documentation

## 2018-05-13 DIAGNOSIS — M5416 Radiculopathy, lumbar region: Secondary | ICD-10-CM | POA: Insufficient documentation

## 2018-05-13 DIAGNOSIS — K224 Dyskinesia of esophagus: Secondary | ICD-10-CM | POA: Insufficient documentation

## 2018-05-13 DIAGNOSIS — Z825 Family history of asthma and other chronic lower respiratory diseases: Secondary | ICD-10-CM | POA: Insufficient documentation

## 2018-05-13 DIAGNOSIS — Z887 Allergy status to serum and vaccine status: Secondary | ICD-10-CM | POA: Insufficient documentation

## 2018-05-13 DIAGNOSIS — G43909 Migraine, unspecified, not intractable, without status migrainosus: Secondary | ICD-10-CM | POA: Insufficient documentation

## 2018-05-13 DIAGNOSIS — Z930 Tracheostomy status: Secondary | ICD-10-CM | POA: Diagnosis not present

## 2018-05-13 DIAGNOSIS — Z8673 Personal history of transient ischemic attack (TIA), and cerebral infarction without residual deficits: Secondary | ICD-10-CM | POA: Insufficient documentation

## 2018-05-13 DIAGNOSIS — Z791 Long term (current) use of non-steroidal anti-inflammatories (NSAID): Secondary | ICD-10-CM | POA: Diagnosis not present

## 2018-05-13 DIAGNOSIS — Z888 Allergy status to other drugs, medicaments and biological substances status: Secondary | ICD-10-CM | POA: Insufficient documentation

## 2018-05-13 DIAGNOSIS — Z8249 Family history of ischemic heart disease and other diseases of the circulatory system: Secondary | ICD-10-CM | POA: Insufficient documentation

## 2018-05-13 DIAGNOSIS — F419 Anxiety disorder, unspecified: Secondary | ICD-10-CM | POA: Insufficient documentation

## 2018-05-13 DIAGNOSIS — Z91048 Other nonmedicinal substance allergy status: Secondary | ICD-10-CM | POA: Insufficient documentation

## 2018-05-13 HISTORY — PX: LUMBAR LAMINECTOMY/DECOMPRESSION MICRODISCECTOMY: SHX5026

## 2018-05-13 SURGERY — LUMBAR LAMINECTOMY/DECOMPRESSION MICRODISCECTOMY 1 LEVEL
Anesthesia: General | Site: Back | Laterality: Bilateral

## 2018-05-13 MED ORDER — ACETAMINOPHEN 160 MG/5ML PO SOLN: 325.0000 mg | Freq: Once | ORAL | Status: DC

## 2018-05-13 MED ORDER — FUROSEMIDE 20 MG PO TABS: 20.0000 mg | ORAL_TABLET | Freq: Every day | ORAL | Status: DC

## 2018-05-13 MED ORDER — CEFAZOLIN SODIUM-DEXTROSE 2-3 GM-%(50ML) IV SOLR
INTRAVENOUS | Status: DC | PRN
  Administered 2018-05-13: 2 g via INTRAVENOUS

## 2018-05-13 MED ORDER — POTASSIUM CHLORIDE 20 MEQ/15ML (10%) PO SOLN: 20.0000 meq | Freq: Every day | ORAL | Status: DC | PRN

## 2018-05-13 MED ORDER — FENTANYL CITRATE (PF) 250 MCG/5ML IJ SOLN
INTRAMUSCULAR | Status: DC | PRN
  Administered 2018-05-13 (×2): 25 ug via INTRAVENOUS

## 2018-05-13 MED ORDER — ACETAMINOPHEN 650 MG RE SUPP: 650.0000 mg | RECTAL | Status: DC | PRN

## 2018-05-13 MED ORDER — HYDROCODONE-ACETAMINOPHEN 7.5-325 MG PO TABS
1.0000 | ORAL_TABLET | Freq: Four times a day (QID) | ORAL | Status: DC
  Administered 2018-05-13: 1 via ORAL
  Filled 2018-05-13: qty 1

## 2018-05-13 MED ORDER — ONDANSETRON HCL 4 MG/2ML IJ SOLN
4.0000 mg | Freq: Four times a day (QID) | INTRAMUSCULAR | Status: DC | PRN
  Administered 2018-05-13: 4 mg via INTRAVENOUS
  Filled 2018-05-13: qty 2

## 2018-05-13 MED ORDER — ACETAMINOPHEN 10 MG/ML IV SOLN: 1000.0000 mg | Freq: Once | INTRAVENOUS | Status: DC | PRN

## 2018-05-13 MED ORDER — DEXAMETHASONE SODIUM PHOSPHATE 10 MG/ML IJ SOLN
INTRAMUSCULAR | Status: AC
  Filled 2018-05-13: qty 1

## 2018-05-13 MED ORDER — PROMETHAZINE HCL 25 MG/ML IJ SOLN: 6.2500 mg | INTRAMUSCULAR | Status: DC | PRN

## 2018-05-13 MED ORDER — ONDANSETRON HCL 4 MG PO TABS: 4.0000 mg | ORAL_TABLET | Freq: Four times a day (QID) | ORAL | Status: DC | PRN

## 2018-05-13 MED ORDER — NAPHAZOLINE-PHENIRAMINE 0.027-0.315 % OP SOLN: 1.0000 [drp] | Freq: Every day | OPHTHALMIC | Status: DC | PRN

## 2018-05-13 MED ORDER — METHOCARBAMOL 500 MG PO TABS: 500.0000 mg | ORAL_TABLET | Freq: Four times a day (QID) | ORAL | Status: DC | PRN

## 2018-05-13 MED ORDER — ESTRADIOL 1 MG PO TABS
1.0000 mg | ORAL_TABLET | Freq: Every day | ORAL | Status: DC
  Filled 2018-05-13: qty 1

## 2018-05-13 MED ORDER — CEFAZOLIN SODIUM 1 G IJ SOLR
INTRAMUSCULAR | Status: AC
  Filled 2018-05-13: qty 20

## 2018-05-13 MED ORDER — HYDROMORPHONE HCL 1 MG/ML IJ SOLN: 0.2500 mg | INTRAMUSCULAR | Status: DC | PRN

## 2018-05-13 MED ORDER — FENTANYL CITRATE (PF) 250 MCG/5ML IJ SOLN
INTRAMUSCULAR | Status: AC
  Filled 2018-05-13: qty 5

## 2018-05-13 MED ORDER — ROCURONIUM BROMIDE 10 MG/ML (PF) SYRINGE
PREFILLED_SYRINGE | INTRAVENOUS | Status: DC | PRN
  Administered 2018-05-13: 50 mg via INTRAVENOUS

## 2018-05-13 MED ORDER — LIDOCAINE 2% (20 MG/ML) 5 ML SYRINGE
INTRAMUSCULAR | Status: AC
  Filled 2018-05-13: qty 5

## 2018-05-13 MED ORDER — SENNA 8.6 MG PO TABS: 1.0000 | ORAL_TABLET | Freq: Two times a day (BID) | ORAL | Status: DC

## 2018-05-13 MED ORDER — PROPOFOL 10 MG/ML IV BOLUS
INTRAVENOUS | Status: AC
  Filled 2018-05-13: qty 20

## 2018-05-13 MED ORDER — FENTANYL CITRATE (PF) 100 MCG/2ML IJ SOLN
25.0000 ug | INTRAMUSCULAR | Status: DC | PRN
  Administered 2018-05-13 (×2): 25 ug via INTRAVENOUS

## 2018-05-13 MED ORDER — LACTATED RINGERS IV SOLN: INTRAVENOUS | Status: DC

## 2018-05-13 MED ORDER — HEMOSTATIC AGENTS (NO CHARGE) OPTIME
TOPICAL | Status: DC | PRN
  Administered 2018-05-13: 1 via TOPICAL

## 2018-05-13 MED ORDER — SALINE SPRAY 0.65 % NA SOLN: 1.0000 | NASAL | Status: DC | PRN

## 2018-05-13 MED ORDER — LACTATED RINGERS IV SOLN
INTRAVENOUS | Status: DC | PRN
  Administered 2018-05-13: 10:00:00 via INTRAVENOUS

## 2018-05-13 MED ORDER — ONDANSETRON HCL 4 MG/2ML IJ SOLN: 4.0000 mg | Freq: Once | INTRAMUSCULAR | Status: DC | PRN

## 2018-05-13 MED ORDER — METHOCARBAMOL 1000 MG/10ML IJ SOLN
500.0000 mg | Freq: Four times a day (QID) | INTRAVENOUS | Status: DC | PRN
  Filled 2018-05-13: qty 5

## 2018-05-13 MED ORDER — BUPIVACAINE HCL (PF) 0.25 % IJ SOLN
INTRAMUSCULAR | Status: DC | PRN
  Administered 2018-05-13: 7 mL

## 2018-05-13 MED ORDER — ROCURONIUM BROMIDE 50 MG/5ML IV SOSY
PREFILLED_SYRINGE | INTRAVENOUS | Status: AC
  Filled 2018-05-13: qty 5

## 2018-05-13 MED ORDER — PHENOL 1.4 % MT LIQD: 1.0000 | OROMUCOSAL | Status: DC | PRN

## 2018-05-13 MED ORDER — ACETAMINOPHEN 325 MG PO TABS: 325.0000 mg | ORAL_TABLET | Freq: Once | ORAL | Status: DC

## 2018-05-13 MED ORDER — HYDROCODONE-ACETAMINOPHEN 7.5-325 MG PO TABS: 1.0000 | ORAL_TABLET | Freq: Four times a day (QID) | ORAL | 0 refills | Status: DC

## 2018-05-13 MED ORDER — ONDANSETRON HCL 4 MG/2ML IJ SOLN
INTRAMUSCULAR | Status: AC
  Filled 2018-05-13: qty 2

## 2018-05-13 MED ORDER — CEFAZOLIN SODIUM-DEXTROSE 2-4 GM/100ML-% IV SOLN
2.0000 g | Freq: Three times a day (TID) | INTRAVENOUS | Status: DC
  Filled 2018-05-13: qty 100

## 2018-05-13 MED ORDER — ALBUTEROL SULFATE HFA 108 (90 BASE) MCG/ACT IN AERS: 2.0000 | INHALATION_SPRAY | Freq: Four times a day (QID) | RESPIRATORY_TRACT | Status: DC | PRN

## 2018-05-13 MED ORDER — MIDAZOLAM HCL 2 MG/2ML IJ SOLN
INTRAMUSCULAR | Status: AC
  Filled 2018-05-13: qty 2

## 2018-05-13 MED ORDER — PANTOPRAZOLE SODIUM 40 MG PO TBEC: 40.0000 mg | DELAYED_RELEASE_TABLET | Freq: Every day | ORAL | Status: DC

## 2018-05-13 MED ORDER — METOPROLOL SUCCINATE ER 25 MG PO TB24: 12.5000 mg | ORAL_TABLET | Freq: Every day | ORAL | Status: DC

## 2018-05-13 MED ORDER — ONDANSETRON HCL 4 MG/2ML IJ SOLN
INTRAMUSCULAR | Status: DC | PRN
  Administered 2018-05-13: 4 mg via INTRAVENOUS

## 2018-05-13 MED ORDER — 0.9 % SODIUM CHLORIDE (POUR BTL) OPTIME
TOPICAL | Status: DC | PRN
  Administered 2018-05-13: 1000 mL

## 2018-05-13 MED ORDER — IPRATROPIUM-ALBUTEROL 0.5-2.5 (3) MG/3ML IN SOLN: 3.0000 mL | RESPIRATORY_TRACT | Status: DC | PRN

## 2018-05-13 MED ORDER — ACETAMINOPHEN 325 MG PO TABS: 650.0000 mg | ORAL_TABLET | ORAL | Status: DC | PRN

## 2018-05-13 MED ORDER — GLYCOPYRROLATE PF 0.2 MG/ML IJ SOSY
PREFILLED_SYRINGE | INTRAMUSCULAR | Status: AC
  Filled 2018-05-13: qty 1

## 2018-05-13 MED ORDER — SODIUM CHLORIDE 0.9% FLUSH: 3.0000 mL | INTRAVENOUS | Status: DC | PRN

## 2018-05-13 MED ORDER — DEXAMETHASONE SODIUM PHOSPHATE 10 MG/ML IJ SOLN
INTRAMUSCULAR | Status: DC | PRN
  Administered 2018-05-13: 10 mg via INTRAVENOUS

## 2018-05-13 MED ORDER — THROMBIN 5000 UNITS EX SOLR
CUTANEOUS | Status: DC | PRN
  Administered 2018-05-13 (×2): 5000 [IU] via TOPICAL

## 2018-05-13 MED ORDER — MONTELUKAST SODIUM 10 MG PO TABS: 10.0000 mg | ORAL_TABLET | Freq: Every day | ORAL | Status: DC

## 2018-05-13 MED ORDER — SODIUM CHLORIDE 0.9% FLUSH: 3.0000 mL | Freq: Two times a day (BID) | INTRAVENOUS | Status: DC

## 2018-05-13 MED ORDER — PROPOFOL 10 MG/ML IV BOLUS
INTRAVENOUS | Status: DC | PRN
  Administered 2018-05-13: 140 mg via INTRAVENOUS

## 2018-05-13 MED ORDER — GLYCOPYRROLATE PF 0.2 MG/ML IJ SOSY
PREFILLED_SYRINGE | INTRAMUSCULAR | Status: DC | PRN
  Administered 2018-05-13: .2 mg via INTRAVENOUS

## 2018-05-13 MED ORDER — LIDOCAINE 2% (20 MG/ML) 5 ML SYRINGE
INTRAMUSCULAR | Status: DC | PRN
  Administered 2018-05-13: 50 mg via INTRAVENOUS

## 2018-05-13 MED ORDER — MEPERIDINE HCL 50 MG/ML IJ SOLN: 6.2500 mg | INTRAMUSCULAR | Status: DC | PRN

## 2018-05-13 MED ORDER — BUPIVACAINE HCL (PF) 0.25 % IJ SOLN
INTRAMUSCULAR | Status: AC
  Filled 2018-05-13: qty 30

## 2018-05-13 MED ORDER — SODIUM CHLORIDE 0.9 % IV SOLN
INTRAVENOUS | Status: DC | PRN
  Administered 2018-05-13: 10:00:00

## 2018-05-13 MED ORDER — THROMBIN 5000 UNITS EX SOLR
CUTANEOUS | Status: AC
  Filled 2018-05-13: qty 10000

## 2018-05-13 MED ORDER — SUGAMMADEX SODIUM 200 MG/2ML IV SOLN
INTRAVENOUS | Status: DC | PRN
  Administered 2018-05-13: 200 mg via INTRAVENOUS

## 2018-05-13 MED ORDER — POTASSIUM CHLORIDE IN NACL 20-0.9 MEQ/L-% IV SOLN
INTRAVENOUS | Status: DC
  Administered 2018-05-13: 14:00:00 via INTRAVENOUS
  Filled 2018-05-13: qty 1000

## 2018-05-13 MED ORDER — LUBRICANT EYE OP OINT: 1.0000 "application " | TOPICAL_OINTMENT | Freq: Two times a day (BID) | OPHTHALMIC | Status: DC | PRN

## 2018-05-13 MED ORDER — SODIUM CHLORIDE 0.9 % IV SOLN: 250.0000 mL | INTRAVENOUS | Status: DC

## 2018-05-13 MED ORDER — MENTHOL 3 MG MT LOZG: 1.0000 | LOZENGE | OROMUCOSAL | Status: DC | PRN

## 2018-05-13 MED ORDER — FENTANYL CITRATE (PF) 100 MCG/2ML IJ SOLN
INTRAMUSCULAR | Status: AC
  Filled 2018-05-13: qty 2

## 2018-05-13 MED ORDER — POLYVINYL ALCOHOL 1.4 % OP SOLN: 1.0000 [drp] | OPHTHALMIC | Status: DC | PRN

## 2018-05-13 SURGICAL SUPPLY — 48 items
BAG DECANTER FOR FLEXI CONT (MISCELLANEOUS) ×2 IMPLANT
BENZOIN TINCTURE PRP APPL 2/3 (GAUZE/BANDAGES/DRESSINGS) ×2 IMPLANT
BUR MATCHSTICK NEURO 3.0 LAGG (BURR) ×2 IMPLANT
CANISTER SUCT 3000ML PPV (MISCELLANEOUS) ×2 IMPLANT
CARTRIDGE OIL MAESTRO DRILL (MISCELLANEOUS) ×1 IMPLANT
COLLAGEN CELLERATERX 1 GRAM (Miscellaneous) ×2 IMPLANT
COVER WAND RF STERILE (DRAPES) ×2 IMPLANT
DERMABOND ADVANCED (GAUZE/BANDAGES/DRESSINGS) ×1
DERMABOND ADVANCED .7 DNX12 (GAUZE/BANDAGES/DRESSINGS) ×1 IMPLANT
DIFFUSER DRILL AIR PNEUMATIC (MISCELLANEOUS) ×2 IMPLANT
DRAPE LAPAROTOMY 100X72X124 (DRAPES) ×2 IMPLANT
DRAPE MICROSCOPE LEICA (MISCELLANEOUS) ×2 IMPLANT
DRAPE POUCH INSTRU U-SHP 10X18 (DRAPES) ×2 IMPLANT
DRAPE SURG 17X23 STRL (DRAPES) ×2 IMPLANT
DRSG OPSITE POSTOP 3X4 (GAUZE/BANDAGES/DRESSINGS) ×2 IMPLANT
DURAPREP 26ML APPLICATOR (WOUND CARE) ×2 IMPLANT
ELECT REM PT RETURN 9FT ADLT (ELECTROSURGICAL) ×2
ELECTRODE REM PT RTRN 9FT ADLT (ELECTROSURGICAL) ×1 IMPLANT
GAUZE 4X4 16PLY RFD (DISPOSABLE) IMPLANT
GLOVE BIO SURGEON STRL SZ7 (GLOVE) IMPLANT
GLOVE BIO SURGEON STRL SZ8 (GLOVE) ×2 IMPLANT
GLOVE BIOGEL PI IND STRL 7.0 (GLOVE) IMPLANT
GLOVE BIOGEL PI INDICATOR 7.0 (GLOVE)
GLOVE ECLIPSE 7.0 STRL STRAW (GLOVE) ×2 IMPLANT
GOWN STRL REUS W/ TWL LRG LVL3 (GOWN DISPOSABLE) IMPLANT
GOWN STRL REUS W/ TWL XL LVL3 (GOWN DISPOSABLE) ×1 IMPLANT
GOWN STRL REUS W/TWL 2XL LVL3 (GOWN DISPOSABLE) IMPLANT
GOWN STRL REUS W/TWL LRG LVL3 (GOWN DISPOSABLE)
GOWN STRL REUS W/TWL XL LVL3 (GOWN DISPOSABLE) ×1
KIT BASIN OR (CUSTOM PROCEDURE TRAY) ×2 IMPLANT
KIT TURNOVER KIT B (KITS) ×2 IMPLANT
NEEDLE HYPO 25X1 1.5 SAFETY (NEEDLE) ×2 IMPLANT
NEEDLE SPNL 20GX3.5 QUINCKE YW (NEEDLE) IMPLANT
NS IRRIG 1000ML POUR BTL (IV SOLUTION) ×2 IMPLANT
OIL CARTRIDGE MAESTRO DRILL (MISCELLANEOUS) ×2
PACK LAMINECTOMY NEURO (CUSTOM PROCEDURE TRAY) ×2 IMPLANT
PAD ARMBOARD 7.5X6 YLW CONV (MISCELLANEOUS) ×6 IMPLANT
RUBBERBAND STERILE (MISCELLANEOUS) ×4 IMPLANT
SPONGE SURGIFOAM ABS GEL SZ50 (HEMOSTASIS) IMPLANT
STRIP CLOSURE SKIN 1/2X4 (GAUZE/BANDAGES/DRESSINGS) ×2 IMPLANT
SUT ETHILON 2 0 FS 18 (SUTURE) ×2 IMPLANT
SUT VIC AB 0 CT1 18XCR BRD8 (SUTURE) ×1 IMPLANT
SUT VIC AB 0 CT1 8-18 (SUTURE) ×1
SUT VIC AB 2-0 CP2 18 (SUTURE) ×2 IMPLANT
SUT VIC AB 3-0 SH 8-18 (SUTURE) ×2 IMPLANT
TOWEL GREEN STERILE (TOWEL DISPOSABLE) ×2 IMPLANT
TOWEL GREEN STERILE FF (TOWEL DISPOSABLE) ×2 IMPLANT
WATER STERILE IRR 1000ML POUR (IV SOLUTION) ×2 IMPLANT

## 2018-05-13 NOTE — Transfer of Care (Signed)
Immediate Anesthesia Transfer of Care Note  Patient: Anne [name]  Procedure(s) Performed: Laminectomy and Foraminotomy - Lumbar four-Lumbar five- bilateral (Bilateral Back)  Patient Location: PACU  Anesthesia Type:General  Level of Consciousness: awake, alert  and oriented  Airway & Oxygen Therapy: Patient Spontanous Breathing and Patient connected to tracheostomy mask oxygen  Post-op Assessment: Report given to RN and Post -op Vital signs reviewed and stable  Post vital signs: Reviewed and stable  Last Vitals:  Vitals Value Taken Time  BP 121/51 05/13/2018 11:51 AM  Temp    Pulse 72 05/13/2018 11:53 AM  Resp 16 05/13/2018 11:53 AM  SpO2 100 % 05/13/2018 11:53 AM  Vitals shown include unvalidated device data.  Last Pain:  Vitals:   05/13/18 0900  TempSrc:   PainSc: 5       Patients Stated Pain Goal: 2 (15/52/08 0223)  Complications: No apparent anesthesia complications

## 2018-05-13 NOTE — Progress Notes (Signed)
Patient around to the floor  From PACU Alert and Oriented X 4 C/o a little Back pain and nausea, Nurse will treat with PRN medication. Lower lumber surgical incision. Honeycomb dressing in place. Scant bleeding. All questions and concerns addressed. Bed in the lowest position with alarm set. Call light in reach.

## 2018-05-13 NOTE — Discharge Summary (Signed)
Physician Discharge Summary  Patient ID: Anne [name] MRN: [id number] DOB/AGE: October 27, 1950 68 y.o.  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 The patient tolerated the procedure well and was taken to the recovery room and then to the floor in stable condition. The hospital course was routine. There were no complications. The wound remained clean dry and intact. Pt had appropriate back soreness. No complaints of leg pain or new N/T/W. The patient remained afebrile with stable vital signs, and tolerated a regular diet. The patient continued to increase activities, and pain was well controlled with oral pain medications.   Consults: None  Significant Diagnostic Studies:  Results for orders placed or performed during the hospital encounter of 05/07/18  Surgical pcr screen  Result Value Ref Range   MRSA, PCR NEGATIVE NEGATIVE   Staphylococcus aureus NEGATIVE NEGATIVE  Basic metabolic panel  Result Value Ref Range   Sodium 140 135 - 145 mmol/L   Potassium 3.2 (L) 3.5 - 5.1 mmol/L   Chloride 103 98 - 111 mmol/L   CO2 26 22 - 32 mmol/L   Glucose, Bld 84 70 - 99 mg/dL   BUN 13 8 - 23 mg/dL   Creatinine, Ser 1.01 (H) 0.44 - 1.00 mg/dL   Calcium 9.6 8.9 - 10.3 mg/dL   GFR calc non Af Amer 57 (L) >60 mL/min   GFR calc Af Amer >60 >60 mL/min   Anion gap 11 5 - 15  CBC  Result Value Ref Range   WBC 7.2 4.0 - 10.5 K/uL   RBC 4.32 3.87 - 5.11 MIL/uL   Hemoglobin 13.6 12.0 - 15.0 g/dL   HCT 42.0 36.0 - 46.0 %   MCV 97.2 80.0 - 100.0 fL   MCH 31.5 26.0 - 34.0 pg   MCHC 32.4 30.0 - 36.0 g/dL   RDW 12.1 11.5 - 15.5 %   Platelets 221 150 - 400 K/uL   nRBC 0.0 0.0 - 0.2 %    Dg Lumbar Spine 2-3  Views  Result Date: 05/13/2018 CLINICAL DATA:  68 year old female. Localization for L4-5 laminectomy. Initial encounter. EXAM: LUMBAR SPINE - 2-3 VIEW Fluoroscopic time: Not provided. COMPARISON:  07/10/2017 lumbar spine MR. FINDINGS: Two intraoperative lateral views of the lumbar spine submitted for review after surgery. Level assignment per prior MR. First film reveals metallic probe at the L4 pedicle level. Second film reveals metallic probe posterior to the L4-5 disc space level. IMPRESSION: Localization L4-5 disc space level. Electronically Signed   By: Genia Del M.D.   On: 05/13/2018 11:45   Mm 3d Screen Breast Bilateral  Result Date: 05/09/2018 CLINICAL DATA:  Screening. EXAM: DIGITAL SCREENING BILATERAL MAMMOGRAM WITH TOMO AND CAD COMPARISON:  Previous exam(s). ACR Breast Density Category c: The breast tissue is heterogeneously dense, which may obscure small masses. FINDINGS: There are no findings suspicious for malignancy. Images were processed with CAD. IMPRESSION: No mammographic evidence of malignancy. A result letter of this screening mammogram will be mailed directly to the patient. RECOMMENDATION: Screening mammogram in one year. (Code:SM-B-01Y) BI-RADS CATEGORY  1: Negative. Electronically Signed   By: Kristopher Oppenheim M.D.   On: 05/09/2018 15:02    Antibiotics:  Anti-infectives (From admission, onward)   Start     Dose/Rate  Route Frequency Ordered Stop   05/13/18 1830  ceFAZolin (ANCEF) IVPB 2g/100 mL premix     2 g 200 mL/hr over 30 Minutes Intravenous Every 8 hours 05/13/18 1343 05/14/18 1029   05/13/18 1000  bacitracin 50,000 Units in sodium chloride 0.9 % 500 mL irrigation  Status:  Discontinued       As needed 05/13/18 1100 05/13/18 1144      Discharge Exam: Blood pressure (!) 124/58, pulse (!) 54, temperature 97.6 F (36.4 C), temperature source Oral, resp. rate 20, SpO2 99 %. Neurologic: Grossly normal ambualting and voiding well  Discharge Medications:    Allergies as of 05/13/2018      Reactions   Asa [aspirin] Shortness Of Breath, Swelling, Other (See Comments)   Tongue swells   Bee Venom Anaphylaxis, Shortness Of Breath   Feraheme [ferumoxytol] Other (See Comments)   Throat squeezing   Influenza Vaccines Shortness Of Breath, Other (See Comments)   Quinolones Swelling, Other (See Comments)   Feet swell   Shellfish Allergy Anaphylaxis   Ciprofloxacin Swelling, Other (See Comments)   ALL MEDS ENDING IN -FLOXACIN MAKE THE FEET SWELL   Clarithromycin Nausea Only   Erythromycin Nausea Only   Levaquin [levofloxacin In D5w] Swelling, Other (See Comments)   Feet and legs ache and SWELL   Peanut-containing Drug Products Other (See Comments)   Wheezing (boiled or raw peanuts)   Septra [sulfamethoxazole-trimethoprim] Diarrhea   Telithromycin Nausea Only   Zanaflex [tizanidine] Other (See Comments)   Caused the patient to feel "spaced out" and "not well"   Adhesive [tape] Rash, Other (See Comments)   Paper works tape works fine    Epinephrine Palpitations, Other (See Comments)   Also makes the heart race      Medication List    TAKE these medications   acetaminophen 500 MG tablet Commonly known as:  TYLENOL Take 500-1,000 mg by mouth 2 (two) times daily as needed for moderate pain or headache.   albuterol 108 (90 Base) MCG/ACT inhaler Commonly known as:  PROVENTIL HFA;VENTOLIN HFA Inhale 2 puffs into the lungs every 6 (six) hours as needed for wheezing or shortness of breath.   ALPRAZolam 0.25 MG tablet Commonly known as:  XANAX Take 0.5 tablets (0.125 mg total) by mouth 2 (two) times daily as needed for anxiety. What changed:  when to take this   alum & mag hydroxide-simeth 200-200-20 MG/5ML suspension Commonly known as:  MAALOX/MYLANTA Take 15 mLs by mouth every 6 (six) hours as needed for indigestion or heartburn.   cetirizine 10 MG tablet Commonly known as:  ZYRTEC TAKE ONE TABLET EVERY DAY What changed:  when to take  this   clopidogrel 75 MG tablet Commonly known as:  PLAVIX TAKE ONE TABLET EVERY MORNING What changed:  when to take this   diphenhydrAMINE 25 MG tablet Commonly known as:  BENADRYL Take 25 mg by mouth daily as needed for allergies.   doxycycline 100 MG tablet Commonly known as:  VIBRA-TABS Take 1 tablet (100 mg total) by mouth 2 (two) times daily.   EPINEPHrine 0.3 mg/0.3 mL Soaj injection Commonly known as:  EPIPEN 2-PAK Inject 0.3 mLs (0.3 mg total) into the muscle once. What changed:    when to take this  reasons to take this   estradiol 1 MG tablet Commonly known as:  ESTRACE TAKE ONE TABLET BY MOUTH EVERY DAY What changed:  when to take this   fluticasone 50 MCG/ACT nasal spray Commonly known as:  FLONASE PLACE  2 SPRAYS INTO BOTH NOSTRILS DAILY. What changed:    when to take this  reasons to take this   furosemide 20 MG tablet Commonly known as:  LASIX TAKE ONE TABLET BY MOUTH EVERY DAY AS NEEDED What changed:  when to take this   guaiFENesin-codeine 100-10 MG/5ML syrup Take 5 mLs by mouth 3 (three) times daily as needed for cough. What changed:  when to take this   HYDROcodone-acetaminophen 7.5-325 MG tablet Commonly known as:  NORCO Take 1 tablet by mouth every 6 (six) hours.   ibuprofen 200 MG tablet Commonly known as:  ADVIL,MOTRIN Take 400-800 mg by mouth daily as needed for headache or moderate pain.   ipratropium-albuterol 0.5-2.5 (3) MG/3ML Soln Commonly known as:  DUONEB Take 3 mLs by nebulization every 4 (four) hours as needed. Dx 496 What changed:  reasons to take this   LUBRICANT EYE Oint Place 1 application into both eyes 2 (two) times daily as needed (for dry eyes).   methylPREDNISolone 4 MG Tbpk tablet Commonly known as:  MEDROL DOSEPAK Take according to pack instructions   metoprolol succinate 25 MG 24 hr tablet Commonly known as:  TOPROL XL Take 0.5 tablets (12.5 mg total) by mouth daily.   montelukast 10 MG  tablet Commonly known as:  SINGULAIR TAKE 1 TABLET BY MOUTH AT BEDTIME   mupirocin ointment 2 % Commonly known as:  BACTROBAN Apply 1 application daily as needed topically (FOR [facility] SITE IRRITATION).   OPCON-A 0.027-0.315 % Soln Generic drug:  Naphazoline-Pheniramine Place 1 drop into both eyes daily as needed (for dry eyes).   pantoprazole 40 MG tablet Commonly known as:  PROTONIX TAKE 1 TABLET DAILY   potassium chloride 20 MEQ/15ML (10%) Soln Take 15 mLs (20 mEq total) by mouth daily as needed (cramps).   sodium chloride 0.65 % Soln nasal spray Commonly known as:  OCEAN Place 1 spray into both nostrils every 4 (four) hours as needed for congestion.   TUSSIONEX PENNKINETIC ER 10-8 MG/5ML Suer Generic drug:  chlorpheniramine-HYDROcodone Take 5 mLs by mouth at bedtime as needed for cough.       Disposition: home   Final Dx: bilateral hemilaminectomies L4-5  Discharge Instructions     Remove dressing in 72 hours   Complete by:  As directed    Call MD for:  difficulty breathing, headache or visual disturbances   Complete by:  As directed    Call MD for:  persistant nausea and vomiting   Complete by:  As directed    Call MD for:  redness, tenderness, or signs of infection (pain, swelling, redness, odor or green/yellow discharge around incision site)   Complete by:  As directed    Call MD for:  severe uncontrolled pain   Complete by:  As directed    Call MD for:  temperature >100.4   Complete by:  As directed    Diet - low sodium heart healthy   Complete by:  As directed    Increase activity slowly   Complete by:  As directed       Follow-up Information    Eustace Moore, MD. Schedule an appointment as soon as possible for a visit in 2 week(s).   Specialty:  Neurosurgery Contact information: [address] [place] [address] [phone]            Signed: [facility] 05/13/2018, 3:16 PM

## 2018-05-13 NOTE — Anesthesia Procedure Notes (Signed)
Procedure Name: Intubation Date/Time: 05/13/2018 10:52 AM Performed by: Teressa Lower., CRNA Pre-anesthesia Checklist: Patient identified, Emergency Drugs available, Suction available and Patient being monitored Patient Re-evaluated:Patient Re-evaluated prior to induction Oxygen Delivery Method: Circle system utilized Preoxygenation: Pre-oxygenation with 100% oxygen Induction Type: IV induction and Tracheostomy Tube type: Oral Tube size: 5.5 mm Number of attempts: 1 Airway Equipment and Method: Tracheostomy Placement Confirmation: positive ETCO2 and breath sounds checked- equal and bilateral Tube secured with: Tape Dental Injury: Teeth and Oropharynx as per pre-operative assessment  Comments: 5.5 reinforced ett place through tracheostomy.

## 2018-05-13 NOTE — Op Note (Signed)
05/13/2018  11:41 AM  PATIENT:  Anne [name]  68 y.o. female  PRE-OPERATIVE DIAGNOSIS:  Lumbar spinal stenosis L4-5 with back and leg pain  POST-OPERATIVE DIAGNOSIS:  same  PROCEDURE: L4-5 decompressive hemilaminectomy medial facetectomy and foraminotomy bilaterally for spinal stenosis decompression  SURGEON:  Sherley Bounds, MD  ASSISTANTSShelba Flake FNP  ANESTHESIA:   General  EBL: less than 25 ml  No intake/output data recorded.  BLOOD ADMINISTERED: none  DRAINS: none  SPECIMEN:  none  INDICATION FOR PROCEDURE: This patient presented with leg pain. Imaging showed stenosis. The patient tried conservative measures without relief. Pain was debilitating. Recommended LL L4-5. Patient understood the risks, benefits, and alternatives and potential outcomes and wished to proceed.  PROCEDURE DETAILS: The patient was taken to the operating room and after induction of adequate generalized endotracheal anesthesia, the patient was rolled into the prone position on the Wilson frame and all pressure points were padded. The lumbar region was cleaned and then prepped with DuraPrep and draped in the usual sterile fashion. 5 cc of local anesthesia was injected and then a dorsal midline incision was made and carried down to the lumbo sacral fascia. The fascia was opened and the paraspinous musculature was taken down in a subperiosteal fashion to expose L4-5 bilaterally. Intraoperative x-ray confirmed my level, and then I used a combination of the high-speed drill and the Kerrison punches to perform a hemilaminectomy, medial facetectomy, and foraminotomy at L4-5 biaterally. The underlying yellow ligament was opened and removed in a piecemeal fashion to expose the underlying dura and exiting nerve root. I undercut the lateral recess and dissected down until I was medial to and distal to the pedicle. The nerve root was well decompressed. We then gently retracted the nerve root medially with a retractor,  coagulated the epidural venous vasculature, and inspected the disc space and found no significant herniation that needed to be removed. I then palpated with a coronary dilator along the nerve root and into the foramen to assure adequate decompression. I felt no more compression of the nerve root on either side. I irrigated with saline solution containing bacitracin. Achieved hemostasis with bipolar cautery, lined the dura with Gelfoam, and then closed the fascia with 0 Vicryl. I closed the subcutaneous tissues with 2-0 Vicryl and the subcuticular tissues with 3-0 Vicryl. The skin was then closed with benzoin and Steri-Strips. The drapes were removed, a sterile dressing was applied. The patient was awakened from general anesthesia and transferred to the recovery room in stable condition. At the end of the procedure all sponge, needle and instrument counts were correct.    PLAN OF CARE: Admit for overnight observation  PATIENT DISPOSITION:  PACU - hemodynamically stable.   Delay start of Pharmacological VTE agent (>24hrs) due to surgical blood loss or risk of bleeding:  yes

## 2018-05-13 NOTE — H&P (Signed)
Subjective: Patient is a 68 y.o. female admitted for [place]. Onset of symptoms was several months ago, gradually worsening since that time.  The pain is rated severe, and is located at the across the lower back and radiates to L>R leg. The pain is described as aching and occurs all day. The symptoms have been progressive. Symptoms are exacerbated by exercise. MRI or CT showed stenosis   Past Medical History:  Diagnosis Date  . Allergic rhinitis   . Allergy    multiple, mostly aspirin, levaquin and shellfish.  . Anemia   . Asthma   . Cataract 2014   exraction with len implant  . Complication of anesthesia    Breathing problems upon waking up. 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  . Compressed cervical disc   . COPD (chronic obstructive pulmonary disease) ([place])   . CVA (cerebral infarction)   . Dyspnea   . Esophageal dysmotility   . Heart murmur    as child  . History of hiatal hernia   . IBS (irritable bowel syndrome)   . Migraine   . PICC (peripherally inserted central catheter) removal 02/20/2017  . PONV (postoperative nausea and vomiting)   . Problems with swallowing    intermittently  . Pulmonary fibrosis ([place])   . Shingles   . Stroke [facility])    slurred speech, drawn face, imaging normal, occurred twice, [facility]-"TIA" if antything" 02/20/17-no residual effects  . Tracheostomy in place [facility])   . Vocal cord paresis     Past Surgical History:  Procedure Laterality Date  . ABDOMINAL HYSTERECTOMY    . APPLICATION OF A-CELL OF HEAD/NECK N/A 02/21/2017   Procedure: APPLICATION OF A-CELL OF HEAD/NECK;  Surgeon: Wallace Going, DO;  Location: [place];  Service: Plastics;  Laterality: N/A;  . BOTOX INJECTION N/A 07/29/2013   Procedure: BOTOX INJECTION;  Surgeon: Jerene Bears, MD;  [facility];  Service: Gastroenterology;  Laterality: N/A;  . BOTOX INJECTION N/A 05/04/2015   Procedure: BOTOX INJECTION;  Surgeon: Jerene Bears, MD;  [facility];  Service: Gastroenterology;  Laterality: N/A;  . BOTOX INJECTION N/A 02/18/2018   Procedure: BOTOX INJECTION;  Surgeon: Jerene Bears, MD;  [facility];  Service: Gastroenterology;  Laterality: N/A;  . BREAST BIOPSY Left    neg  . BREAST SURGERY Left 2002   bx of skin  . CHOLECYSTECTOMY    . COLONOSCOPY    . ESOPHAGEAL MANOMETRY N/A 12/16/2012   Procedure: ESOPHAGEAL MANOMETRY (EM);  Surgeon: Jerene Bears, MD;  [facility];  Service: Gastroenterology;  Laterality: N/A;  . ESOPHAGOGASTRODUODENOSCOPY (EGD) WITH PROPOFOL N/A 07/29/2013   Procedure: ESOPHAGOGASTRODUODENOSCOPY (EGD) WITH PROPOFOL;  Surgeon: Jerene Bears, MD;  [facility];  Service: Gastroenterology;  Laterality: N/A;  with botox injection  . ESOPHAGOGASTRODUODENOSCOPY (EGD) WITH PROPOFOL N/A 05/04/2015   Procedure: ESOPHAGOGASTRODUODENOSCOPY (EGD) WITH PROPOFOL;  Surgeon: Jerene Bears, MD;  [facility];  Service: Gastroenterology;  Laterality: N/A;  . ESOPHAGOGASTRODUODENOSCOPY (EGD) WITH PROPOFOL N/A 02/18/2018   Procedure: ESOPHAGOGASTRODUODENOSCOPY (EGD) WITH PROPOFOL;  Surgeon: Jerene Bears, MD;  [facility];  Service: Gastroenterology;  Laterality: N/A;  . EYE SURGERY     Catarct surgery 2014  . Eye Surgery AS Child Left   . INCISION AND DRAINAGE OF WOUND N/A 02/21/2017   Procedure: IRRIGATION AND DEBRIDEMENT WOUND NECK;  Surgeon: Wallace Going, DO;  Location: [place];  Service: Plastics;  Laterality: N/A;  . JEJUNOSTOMY  FEEDING TUBE     x2 both failed. no longer has  . MULTIPLE TOOTH EXTRACTIONS     2 teeth removed  . OOPHORECTOMY    . POSTERIOR CERVICAL FUSION/FORAMINOTOMY N/A 01/10/2017   Procedure: LAMINECTOMY AND FORAMINOTOMY CERVICAL FOUR-CERVICAL FIVE, CERVICAL FIVE-SIX POSTERIOR CERVICAL INSTRUMENT FUSION CERVICAL THREE-CERVICAL SEVEN,CERVICAL LAMINECTOMY CERVICAL THREE-CERVICAL SEVEN.;  Surgeon: Eustace Moore, MD;  Location: [place];  Service:  Neurosurgery;  Laterality: N/A;  posterior  . TRACHEOSTOMY  1996   done at [facility], Dr. Kathyrn Sheriff  . TUBAL LIGATION    . VIDEO BRONCHOSCOPY Bilateral 11/20/2012   Procedure: VIDEO BRONCHOSCOPY WITH FLUORO;  Surgeon: Juanito Doom, MD;  [facility];  Service: Cardiopulmonary;  Laterality: Bilateral;    Prior to Admission medications   Medication Sig Start Date End Date Taking? Authorizing Provider  acetaminophen (TYLENOL) 500 MG tablet Take 500-1,000 mg by mouth 2 (two) times daily as needed for moderate pain or headache.    Yes [provider]  albuterol (PROVENTIL HFA;VENTOLIN HFA) 108 (90 Base) MCG/ACT inhaler Inhale 2 puffs into the lungs every 6 (six) hours as needed for wheezing or shortness of breath. 09/12/16  Yes [name], MD  ALPRAZolam Duanne Moron) 0.25 MG tablet Take 0.5 tablets (0.125 mg total) by mouth 2 (two) times daily as needed for anxiety. Patient taking differently: Take 0.125 mg by mouth at bedtime.  03/12/18  Yes [name], MD  cetirizine (ZYRTEC) 10 MG tablet TAKE ONE TABLET EVERY DAY Patient taking differently: Take 10 mg by mouth at bedtime.  12/20/17  Yes [name], MD  chlorpheniramine-HYDROcodone [facility] PENNKINETIC ER) 10-8 MG/5ML SUER Take 5 mLs by mouth at bedtime as needed for cough.   Yes [provider]  clopidogrel (PLAVIX) 75 MG tablet TAKE ONE TABLET EVERY MORNING Patient taking differently: Take 75 mg by mouth daily.  10/18/17  Yes [name], MD  diphenhydrAMINE (BENADRYL) 25 MG tablet Take 25 mg by mouth daily as needed for allergies.   Yes [provider]  estradiol (ESTRACE) 1 MG tablet TAKE ONE TABLET BY MOUTH EVERY DAY Patient taking differently: Take 1 mg by mouth at bedtime.  08/24/17  Yes [name], MD  fluticasone (FLONASE) 50 MCG/ACT nasal spray PLACE 2 SPRAYS INTO BOTH NOSTRILS DAILY. Patient taking differently: Place 2 sprays into both nostrils daily as needed for  allergies.  12/22/15  Yes Mungal, Vishal, MD  furosemide (LASIX) 20 MG tablet TAKE ONE TABLET BY MOUTH EVERY DAY AS NEEDED Patient taking differently: Take 20 mg by mouth daily.  03/21/18  Yes [name], MD  ibuprofen (ADVIL,MOTRIN) 200 MG tablet Take 400-800 mg by mouth daily as needed for headache or moderate pain.   Yes [provider]  ipratropium-albuterol (DUONEB) 0.5-2.5 (3) MG/3ML SOLN Take 3 mLs by nebulization every 4 (four) hours as needed. Dx 496 Patient taking differently: Take 3 mLs by nebulization every 4 (four) hours as needed (wheezing or shortness of breath). Dx 496 03/08/17  Yes Wilhelmina Mcardle, MD  metoprolol succinate (TOPROL XL) 25 MG 24 hr tablet Take 0.5 tablets (12.5 mg total) by mouth daily. 10/30/17  Yes Wellington Hampshire, MD  montelukast (SINGULAIR) 10 MG tablet TAKE 1 TABLET BY MOUTH AT BEDTIME Patient taking differently: Take 10 mg by mouth at bedtime.  10/29/17  Yes [name], MD  mupirocin ointment (BACTROBAN) 2 % Apply 1 application daily as needed topically (FOR [facility] SITE IRRITATION).    Yes  [provider]  Naphazoline-Pheniramine (OPCON-A) 0.027-0.315 % SOLN Place 1 drop into both eyes daily as needed (for dry eyes).    Yes [provider]  pantoprazole (PROTONIX) 40 MG tablet TAKE 1 TABLET DAILY Patient taking differently: Take 40 mg by mouth daily.  10/29/17  Yes Pyrtle, Lajuan Lines, MD  potassium chloride 20 MEQ/15ML (10%) SOLN Take 15 mLs (20 mEq total) by mouth daily as needed (cramps). 10/29/17  Yes [name], MD  sodium chloride (OCEAN) 0.65 % SOLN nasal spray Place 1 spray into both nostrils every 4 (four) hours as needed for congestion.    Yes [provider]  White Petrolatum-Mineral Oil (LUBRICANT EYE) OINT Place 1 application into both eyes 2 (two) times daily as needed (for dry eyes).    Yes [provider]  alum & mag hydroxide-simeth (MAALOX/MYLANTA) 200-200-20 MG/5ML suspension Take 15 mLs  by mouth every 6 (six) hours as needed for indigestion or heartburn.    [provider]  doxycycline (VIBRA-TABS) 100 MG tablet Take 1 tablet (100 mg total) by mouth 2 (two) times daily. Patient not taking: Reported on 04/24/2018 04/09/18   Jodelle Green, FNP  EPINEPHrine (EPIPEN 2-PAK) 0.3 mg/0.3 mL IJ SOAJ injection Inject 0.3 mLs (0.3 mg total) into the muscle once. Patient taking differently: Inject 0.3 mg into the muscle as needed for anaphylaxis.  02/10/15   Jackolyn Confer, MD  guaiFENesin-codeine 100-10 MG/5ML syrup Take 5 mLs by mouth 3 (three) times daily as needed for cough. Patient taking differently: Take 5 mLs by mouth at bedtime as needed for cough.  04/09/18   Jodelle Green, FNP  methylPREDNISolone (MEDROL DOSEPAK) 4 MG TBPK tablet Take according to pack instructions Patient not taking: Reported on 04/24/2018 04/09/18   Jodelle Green, FNP   Allergies  Allergen Reactions  . Asa [Aspirin] Shortness Of Breath, Swelling and Other (See Comments)    Tongue swells  . Bee Venom Anaphylaxis and Shortness Of Breath  . Feraheme [Ferumoxytol] Other (See Comments)    Throat squeezing  . Influenza Vaccines Shortness Of Breath and Other (See Comments)  . Quinolones Swelling and Other (See Comments)    Feet swell  . Shellfish Allergy Anaphylaxis  . Ciprofloxacin Swelling and Other (See Comments)    ALL MEDS ENDING IN -FLOXACIN MAKE THE FEET SWELL  . Clarithromycin Nausea Only  . Erythromycin Nausea Only  . Levaquin [Levofloxacin In D5w] Swelling and Other (See Comments)    Feet and legs ache and SWELL  . Peanut-Containing Drug Products Other (See Comments)    Wheezing (boiled or raw peanuts)  . Septra [Sulfamethoxazole-Trimethoprim] Diarrhea  . Telithromycin Nausea Only  . Zanaflex [Tizanidine] Other (See Comments)    Caused the patient to feel "spaced out" and "not well"  . Adhesive [Tape] Rash and Other (See Comments)    Paper works tape works fine   . Epinephrine  Palpitations and Other (See Comments)    Also makes the heart race    Social History   Tobacco Use  . Smoking status: Never Smoker  . Smokeless tobacco: Never Used  Substance Use Topics  . Alcohol use: Yes    Alcohol/week: 0.0 standard drinks    Comment: occ.    Family History  Problem Relation Age of Onset  . Asthma Cousin   . COPD Cousin   . Breast cancer Maternal Grandmother 60  . Cancer Maternal Grandmother        breast  . Asthma Father   .  Cancer Father        lung  . Kidney cancer Father   . Arrhythmia Father   . Cancer Paternal Uncle        lung  . COPD Paternal Grandfather   . Breast cancer Maternal Aunt 8  . Cancer Maternal Aunt        breast  . Bladder Cancer Neg Hx      Review of Systems  Positive ROS: neg  All other systems have been reviewed and were otherwise negative with the exception of those mentioned in the HPI and as above.  Objective: Vital signs in last 24 hours: Temp:  [98.1 F (36.7 C)] 98.1 F (36.7 C) (02/17 0834) Pulse Rate:  [64] 64 (02/17 0834) Resp:  [20] 20 (02/17 0834) BP: (117-130)/(41-58) 117/58 (02/17 0839) SpO2:  [100 %] 100 % (02/17 0834)  General Appearance: Alert, cooperative, no distress, appears stated age Head: Normocephalic, without obvious abnormality, atraumatic Eyes: PERRL, conjunctiva/corneas clear, EOM's intact    Neck: Supple, symmetrical, trachea midline Back: Symmetric, no curvature, ROM normal, no CVA tenderness Lungs:  respirations unlabored Heart: Regular rate and rhythm Abdomen: Soft, non-tender Extremities: Extremities normal, atraumatic, no cyanosis or edema Pulses: 2+ and symmetric all extremities Skin: Skin color, texture, turgor normal, no rashes or lesions  NEUROLOGIC:   Mental status: Alert and oriented x4,  no aphasia, good attention span, fund of knowledge, and memory Motor Exam - grossly normal Sensory Exam - grossly normal Reflexes: 1+ Coordination - grossly normal Gait - grossly  normal Balance - grossly normal Cranial Nerves: I: smell Not tested  II: visual acuity  OS: nl    OD: nl  II: visual fields Full to confrontation  II: pupils Equal, round, reactive to light  III,VII: ptosis None  III,IV,VI: extraocular muscles  Full ROM  V: mastication Normal  V: facial light touch sensation  Normal  V,VII: corneal reflex  Present  VII: facial muscle function - upper  Normal  VII: facial muscle function - lower Normal  VIII: hearing Not tested  IX: soft palate elevation  Normal  IX,X: gag reflex Present  XI: trapezius strength  5/5  XI: sternocleidomastoid strength 5/5  XI: neck flexion strength  5/5  XII: tongue strength  Normal    Data Review Lab Results  Component Value Date   WBC 7.2 05/07/2018   HGB 13.6 05/07/2018   HCT 42.0 05/07/2018   MCV 97.2 05/07/2018   PLT 221 05/07/2018   Lab Results  Component Value Date   NA 140 05/07/2018   K 3.2 (L) 05/07/2018   CL 103 05/07/2018   CO2 26 05/07/2018   BUN 13 05/07/2018   CREATININE 1.01 (H) 05/07/2018   GLUCOSE 84 05/07/2018   Lab Results  Component Value Date   INR 0.97 01/04/2017    Assessment/Plan:  Estimated body mass index is 17.79 kg/m as calculated from the following:   Height as of 05/07/18: 5' 3.5" (1.613 m).   Weight as of 05/07/18: 46.3 kg. Patient admitted for DLL L4-5. Patient has failed a reasonable attempt at conservative therapy.  I explained the condition and procedure to the patient and answered any questions.  Patient wishes to proceed with procedure as planned. Understands risks/ benefits and typical outcomes of procedure.   Eustace Moore 05/13/2018 9:47 AM

## 2018-05-13 NOTE — Anesthesia Postprocedure Evaluation (Addendum)
Anesthesia Post Note  Patient: Anne [name]  Procedure(s) Performed: Laminectomy and Foraminotomy - Lumbar four-Lumbar five- bilateral (Bilateral Back)     Patient location during evaluation: PACU Anesthesia Type: General Level of consciousness: awake and alert Pain management: pain level controlled Vital Signs Assessment: post-procedure vital signs reviewed and stable Respiratory status: spontaneous breathing, nonlabored ventilation, respiratory function stable and patient connected to tracheostomy mask oxygen Cardiovascular status: blood pressure returned to baseline and stable Postop Assessment: no apparent nausea or vomiting Anesthetic complications: no    Last Vitals:  Vitals:   05/13/18 1327 05/13/18 1351  BP:  (!) 124/58  Pulse: 62 (!) 54  Resp: (!) 21 20  Temp:  36.4 C  SpO2: 95% 99%    Last Pain:  Vitals:   05/13/18 1351  TempSrc: Oral  PainSc:                  [name]

## 2018-05-13 NOTE — Addendum Note (Signed)
Addendum  created 05/13/18 1614 by [name], MD   Clinical Note Signed

## 2018-05-14 ENCOUNTER — Encounter (HOSPITAL_COMMUNITY): Payer: Self-pay | Admitting: Neurological Surgery

## 2018-05-16 ENCOUNTER — Other Ambulatory Visit: Payer: Self-pay | Admitting: Family Medicine

## 2018-05-21 ENCOUNTER — Other Ambulatory Visit: Payer: Self-pay | Admitting: Family Medicine

## 2018-05-25 ENCOUNTER — Other Ambulatory Visit: Payer: Self-pay | Admitting: Family Medicine

## 2018-06-03 ENCOUNTER — Telehealth: Payer: Self-pay | Admitting: Family Medicine

## 2018-06-03 MED ORDER — POTASSIUM CHLORIDE CRYS ER 20 MEQ PO TBCR: 20.0000 meq | EXTENDED_RELEASE_TABLET | Freq: Every day | ORAL | 1 refills | Status: DC | PRN

## 2018-06-03 NOTE — Telephone Encounter (Signed)
Copied from [place] [id number]. Topic: Quick Communication - Rx Refill/Question >> Jun 03, 2018  1:28 PM Burchel, Abbi R wrote: Medication: potassium chloride 20 MEQ/15ML (10%) SOLN  Pt would like to switch to the pill form instead of the liquid due to cost.    Preferred Pharmacy: [facility], [place] - [address]  [phone] (Phone) [phone] (Fax)  Pt was advised that RX refills may take up to 3 business days. We ask that you follow-up with your pharmacy.

## 2018-06-03 NOTE — Telephone Encounter (Signed)
Sent to pharmacy 

## 2018-06-03 NOTE — Telephone Encounter (Signed)
Sent to PCP for approval.  

## 2018-06-03 NOTE — Telephone Encounter (Signed)
Will route to office for final disposition; pt seen by Dr [name]; also see CRM # [phone].

## 2018-06-04 NOTE — Telephone Encounter (Signed)
Called and spoke with pt. Pt advised and voiced understanding.  

## 2018-06-06 ENCOUNTER — Ambulatory Visit (INDEPENDENT_AMBULATORY_CARE_PROVIDER_SITE_OTHER): Payer: Medicare Other

## 2018-06-06 ENCOUNTER — Other Ambulatory Visit: Payer: Self-pay

## 2018-06-06 DIAGNOSIS — E538 Deficiency of other specified B group vitamins: Secondary | ICD-10-CM

## 2018-06-06 MED ORDER — CYANOCOBALAMIN 1000 MCG/ML IJ SOLN
1000.0000 ug | Freq: Once | INTRAMUSCULAR | Status: AC
  Administered 2018-06-06: 1000 ug via INTRAMUSCULAR

## 2018-06-06 NOTE — Progress Notes (Signed)
[name] presents today for injection per MD orders. B12  administered IM in right Upper Arm. Administration without incident. Patient tolerated well.  Klyn Kroening,CMA

## 2018-06-07 ENCOUNTER — Telehealth: Payer: Self-pay | Admitting: Internal Medicine

## 2018-06-07 MED ORDER — SUCRALFATE 1 G PO TABS: 1.0000 g | ORAL_TABLET | Freq: Three times a day (TID) | ORAL | 0 refills | Status: DC

## 2018-06-07 MED ORDER — PANTOPRAZOLE SODIUM 40 MG PO TBEC: 40.0000 mg | DELAYED_RELEASE_TABLET | Freq: Two times a day (BID) | ORAL | 0 refills | Status: DC

## 2018-06-07 NOTE — Telephone Encounter (Signed)
Pt reports she is having an issue with bloating, painful gas, and diarrhea.

## 2018-06-07 NOTE — Telephone Encounter (Signed)
Pt states she had back surgery recently and she is having symptoms of gastritis. Reports she is having issues with bloating and painful gas and some diarrhea. States she is usually able to get it under control with OTC meds such as Pepto, probiotics, imodium, etc. This time these remedies are not helping. Pt wants to know what Dr. Hilarie Fredrickson suggests. Please advise.

## 2018-06-07 NOTE — Telephone Encounter (Signed)
Pt called in stating that she is having lots of bloating and gas, and had diarrhea last night. She stated that she is taking something's over the counter but its not working. She is wanting to speak with the nurse for some advice to help with her symptoms. Pt didn't want to set an appt she wanted to speak with nurse first.

## 2018-06-07 NOTE — Telephone Encounter (Signed)
Please see if she is having to use narcotics after surgery, if so this likely explains the bloating and gas If not, would recommend she increase pantoprazole to 40 mg twice daily AC for 1 to 2 weeks Can add sucralfate liquid or slurry 1 g 3 times daily before meals and at bedtime for 1 to 2 weeks And then have her update me on how symptoms are evolving

## 2018-06-07 NOTE — Telephone Encounter (Signed)
Spoke with pt and she is aware, scripts sent to pharmacy. 

## 2018-06-12 ENCOUNTER — Inpatient Hospital Stay: Payer: Medicare Other | Attending: Hematology and Oncology

## 2018-06-12 ENCOUNTER — Other Ambulatory Visit: Payer: Medicare Other

## 2018-06-12 ENCOUNTER — Other Ambulatory Visit: Payer: Self-pay

## 2018-06-12 DIAGNOSIS — D509 Iron deficiency anemia, unspecified: Secondary | ICD-10-CM | POA: Diagnosis not present

## 2018-06-12 DIAGNOSIS — E538 Deficiency of other specified B group vitamins: Secondary | ICD-10-CM | POA: Diagnosis not present

## 2018-06-12 LAB — CBC WITH DIFFERENTIAL/PLATELET
Abs Immature Granulocytes: 0.02 10*3/uL (ref 0.00–0.07)
Basophils Absolute: 0.1 10*3/uL (ref 0.0–0.1)
Basophils Relative: 1 %
Eosinophils Absolute: 0.6 10*3/uL — ABNORMAL HIGH (ref 0.0–0.5)
Eosinophils Relative: 9 %
HCT: 42.4 % (ref 36.0–46.0)
Hemoglobin: 14.2 g/dL (ref 12.0–15.0)
Immature Granulocytes: 0 %
Lymphocytes Relative: 23 %
Lymphs Abs: 1.5 10*3/uL (ref 0.7–4.0)
MCH: 32.1 pg (ref 26.0–34.0)
MCHC: 33.5 g/dL (ref 30.0–36.0)
MCV: 95.9 fL (ref 80.0–100.0)
Monocytes Absolute: 0.7 10*3/uL (ref 0.1–1.0)
Monocytes Relative: 10 %
Neutro Abs: 3.7 10*3/uL (ref 1.7–7.7)
Neutrophils Relative %: 57 %
Platelets: 195 10*3/uL (ref 150–400)
RBC: 4.42 MIL/uL (ref 3.87–5.11)
RDW: 12.2 % (ref 11.5–15.5)
WBC: 6.4 10*3/uL (ref 4.0–10.5)
nRBC: 0 % (ref 0.0–0.2)

## 2018-06-12 LAB — FERRITIN: Ferritin: 203 ng/mL (ref 11–307)

## 2018-06-13 ENCOUNTER — Ambulatory Visit: Payer: Medicare Other | Admitting: Hematology and Oncology

## 2018-06-13 ENCOUNTER — Ambulatory Visit: Payer: Medicare Other

## 2018-06-13 ENCOUNTER — Inpatient Hospital Stay: Payer: Medicare Other | Admitting: Urgent Care

## 2018-06-13 ENCOUNTER — Inpatient Hospital Stay: Payer: Medicare Other

## 2018-06-19 ENCOUNTER — Other Ambulatory Visit: Payer: Self-pay | Admitting: Family Medicine

## 2018-06-21 ENCOUNTER — Encounter: Payer: Self-pay | Admitting: Family Medicine

## 2018-06-21 NOTE — Telephone Encounter (Signed)
Called and spoke with pt.   Pt has not traveled outside the country in the past 3 months.  Pt denied having a fever or feeling SOB.   Pt does have a dry cough.   Pt denied being around anyone who has tested positive for the virus.   Pt stated that her symptoms start 3 days ago nothing OTC has helped and she stated that this always happens to her during the winter and spring time. Pt is c/o dry cough, headache, sinus congestion and pressure, and horsed voice. No fever. Pt has tried coricidin, nasal spray, and tylenol.   Pt stated that normally the prednisone and cough medication will take care of these symptoms other wise if she doesn't treat her symptoms in time it turns into bronchitis.   Sent to PCP to advise

## 2018-06-22 ENCOUNTER — Other Ambulatory Visit: Payer: Self-pay | Admitting: Internal Medicine

## 2018-06-22 MED ORDER — PREDNISONE 20 MG PO TABS: 40.0000 mg | ORAL_TABLET | Freq: Every day | ORAL | 0 refills | Status: DC

## 2018-06-22 NOTE — Telephone Encounter (Signed)
Patient appears to be low risk for coronavirus given her symptoms, lack of fever, shortness of breath, travel, and exposure. This seems consistent with prior issues she has had. I have sent in prednisone for her to start on. I have responded to her message.

## 2018-06-27 ENCOUNTER — Other Ambulatory Visit: Payer: Self-pay

## 2018-06-27 MED ORDER — AMOXICILLIN-POT CLAVULANATE 250-62.5 MG/5ML PO SUSR: 500.0000 mg | Freq: Three times a day (TID) | ORAL | 0 refills | Status: DC

## 2018-06-27 NOTE — Telephone Encounter (Signed)
See patient email  In the past we have tried antibiotics which has significantly improved the symptoms. Given limitations with COVID-19 and office visits I would proceed as follows  Augmentin 500 mg 3 times a day liquid x7 days Virtual visit in 3 to 4 weeks for reassessment/assess response to therapy

## 2018-06-28 ENCOUNTER — Other Ambulatory Visit: Payer: Self-pay

## 2018-06-30 NOTE — Progress Notes (Signed)
[facility]  [address], [address] [place], [place] [address] Phone: [phone]  Fax: [phone]   Telephone Office Visit:  07/01/2018  Referring physician:  [name], MD   I connected with Anne [name] on 07/01/18 at 1:30 PM EDT by telephone and verified that I was speaking with the correct person using 2 identifiers.  The patient was at home.  I discussed the limitations, risk, security and privacy concerns of performing an evaluation and management service by telephone and the availability of in person appointments.  I also discussed with the patient that there may be a patient responsible charge related to this service.  The patient expressed understanding and agreed to proceed.    Chief Complaint: Anne [name] is a 68 y.o. female with iron deficiency who is seen for 6 month assessment.  HPI:  The patient was last seen in the hematology clinic on 12/11/2017 for initial consultation.  She had iron deficiency anemia.  Diet was poor.  She was unresponsive to oral iron.  Symptomatically, she was fatigued.  She denied any melena, hematochezia, hematuria or vaginal bleeding.  Exam was unremarkable.  Hemoglobin was 12.6, hematocrit 37.9, MCV 93.7, and platelets 208,000.  Ferritin was 255. Iron saturation was 30% with a TIBC of 315.  No Venofer needed.   CBC has been followed: 03/13/2018: hematocrit 41.2, hemoglobin 13.9, MCV 94.3, platelets 211,000, white count 4700 with an ANC of 2400. 06/12/2018: Hematocrit 42.4, hemoglobin 14.2, MCV 95.9, platelets 195,000, white count 6400 with an ANC of 3700.  Ferritin was 203.  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 She  underwent bilateral L4-5 decompressive hemilaminectomy by Dr. Sherley Bounds.  The patient tolerated the procedure well.  During the interim, she has done well.  Her back is feeling better.  She states the "left side worked" and "the right side still has some  pain".  She notes after the procedure that she has had issues with gastritis for which she has been prescribed Carafate and extra Protonix.  Her upper abdominal symptoms feel like "a belt around her".  It felt better not to eat.  She is now feeling better.  She is watching what she eats.  She is no longer seeing Dr Blain Pais for the renal lesion.  She is seeing Dr. Louanne Skye at [facility].  MRI of the abdomen on 10/22/2017 revealed a 1.4 cm T2 hyperintense, T1 hypointense with central nodule/scar in the left superior pole.  Size was similar to prior imaging.  A small renal cell carcinoma or oncocytoma was considered.  She is under active surveillance.  She was scheduled for follow-up imaging every 6 months.  She missed her last scan.   Past Medical History:  Diagnosis Date  . Allergic rhinitis   . Allergy    multiple, mostly aspirin, levaquin and shellfish.  . Anemia   . Asthma   . Cataract 2014   exraction with len implant  . Complication of anesthesia    Breathing problems upon waking up. 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  . Compressed cervical disc   . COPD (chronic obstructive pulmonary disease) ([place])   . CVA (cerebral infarction)   . Dyspnea   . Esophageal dysmotility   . Heart murmur    as child  . History of hiatal hernia   . IBS (irritable bowel syndrome)   . Migraine   . PICC (peripherally inserted central catheter)  removal 02/20/2017  . PONV (postoperative nausea and vomiting)   . Problems with swallowing    intermittently  . Pulmonary fibrosis ([place])   . Shingles   . Stroke [facility])    slurred speech, drawn face, imaging normal, occurred twice, [facility]-"TIA" if antything" 02/20/17-no residual effects  . Tracheostomy in place [facility])   . Vocal cord paresis     Past Surgical History:  Procedure Laterality Date  . ABDOMINAL HYSTERECTOMY    . APPLICATION OF A-CELL OF HEAD/NECK N/A 02/21/2017   Procedure: APPLICATION OF A-CELL OF HEAD/NECK;   Surgeon: Wallace Going, DO;  Location: [place];  Service: Plastics;  Laterality: N/A;  . BOTOX INJECTION N/A 07/29/2013   Procedure: BOTOX INJECTION;  Surgeon: Jerene Bears, MD;  Location: [facility];  Service: Gastroenterology;  Laterality: N/A;  . BOTOX INJECTION N/A 05/04/2015   Procedure: BOTOX INJECTION;  Surgeon: Jerene Bears, MD;  Location: [facility];  Service: Gastroenterology;  Laterality: N/A;  . BOTOX INJECTION N/A 02/18/2018   Procedure: BOTOX INJECTION;  Surgeon: Jerene Bears, MD;  Location: [facility];  Service: Gastroenterology;  Laterality: N/A;  . BREAST BIOPSY Left    neg  . BREAST SURGERY Left 2002   bx of skin  . CHOLECYSTECTOMY    . COLONOSCOPY    . ESOPHAGEAL MANOMETRY N/A 12/16/2012   Procedure: ESOPHAGEAL MANOMETRY (EM);  Surgeon: Jerene Bears, MD;  Location: [facility];  Service: Gastroenterology;  Laterality: N/A;  . ESOPHAGOGASTRODUODENOSCOPY (EGD) WITH PROPOFOL N/A 07/29/2013   Procedure: ESOPHAGOGASTRODUODENOSCOPY (EGD) WITH PROPOFOL;  Surgeon: Jerene Bears, MD;  Location: [facility];  Service: Gastroenterology;  Laterality: N/A;  with botox injection  . ESOPHAGOGASTRODUODENOSCOPY (EGD) WITH PROPOFOL N/A 05/04/2015   Procedure: ESOPHAGOGASTRODUODENOSCOPY (EGD) WITH PROPOFOL;  Surgeon: Jerene Bears, MD;  Location: [facility];  Service: Gastroenterology;  Laterality: N/A;  . ESOPHAGOGASTRODUODENOSCOPY (EGD) WITH PROPOFOL N/A 02/18/2018   Procedure: ESOPHAGOGASTRODUODENOSCOPY (EGD) WITH PROPOFOL;  Surgeon: Jerene Bears, MD;  Location: [facility];  Service: Gastroenterology;  Laterality: N/A;  . EYE SURGERY     Catarct surgery 2014  . Eye Surgery AS Child Left   . INCISION AND DRAINAGE OF WOUND N/A 02/21/2017   Procedure: IRRIGATION AND DEBRIDEMENT WOUND NECK;  Surgeon: Wallace Going, DO;  Location: [place];  Service: Plastics;  Laterality: N/A;  . JEJUNOSTOMY FEEDING TUBE     x2 both failed. no longer has  . LUMBAR LAMINECTOMY/DECOMPRESSION  MICRODISCECTOMY Bilateral 05/13/2018   Procedure: Laminectomy and Foraminotomy - Lumbar four-Lumbar five- bilateral;  Surgeon: Eustace Moore, MD;  Location: [place];  Service: Neurosurgery;  Laterality: Bilateral;  . MULTIPLE TOOTH EXTRACTIONS     2 teeth removed  . OOPHORECTOMY    . POSTERIOR CERVICAL FUSION/FORAMINOTOMY N/A 01/10/2017   Procedure: LAMINECTOMY AND FORAMINOTOMY CERVICAL FOUR-CERVICAL FIVE, CERVICAL FIVE-SIX POSTERIOR CERVICAL INSTRUMENT FUSION CERVICAL THREE-CERVICAL SEVEN,CERVICAL LAMINECTOMY CERVICAL THREE-CERVICAL SEVEN.;  Surgeon: Eustace Moore, MD;  Location: [place];  Service: Neurosurgery;  Laterality: N/A;  posterior  . TRACHEOSTOMY  1996   [facility], Dr. Kathyrn Sheriff  . TUBAL LIGATION    . VIDEO BRONCHOSCOPY Bilateral 11/20/2012   Procedure: VIDEO BRONCHOSCOPY WITH FLUORO;  Surgeon: Juanito Doom, MD;  Location[facility];  Service: Cardiopulmonary;  Laterality: Bilateral;    Family History  Problem Relation Age of Onset  . Asthma Cousin   . COPD Cousin   . Breast cancer Maternal Grandmother 60  . Cancer Maternal Grandmother  breast  . Asthma Father   . Cancer Father        lung  . Kidney cancer Father   . Arrhythmia Father   . Cancer Paternal Uncle        lung  . COPD Paternal Grandfather   . Breast cancer Maternal Aunt 87  . Cancer Maternal Aunt        breast  . Bladder Cancer Neg Hx     Social History:  reports that she has never smoked. She has never used smokeless tobacco. She reports current alcohol use. She reports that she does not use drugs.  Patient is retired from an Sports coach. She ultimately went out on disability in 2001 following a tracheostomy.   Participants in the patient's visit included the patient and Waymon Budge, RN today.  The intake visit was provided by Waymon Budge, RN.   Allergies:  Allergies  Allergen Reactions  . Asa [Aspirin] Shortness Of Breath, Swelling and Other (See Comments)     Tongue swells  . Bee Venom Anaphylaxis and Shortness Of Breath  . Feraheme [Ferumoxytol] Other (See Comments)    Throat squeezing  . Influenza Vaccines Shortness Of Breath and Other (See Comments)  . Quinolones Swelling and Other (See Comments)    Feet swell  . Shellfish Allergy Anaphylaxis  . Ciprofloxacin Swelling and Other (See Comments)    ALL MEDS ENDING IN -FLOXACIN MAKE THE FEET SWELL  . Clarithromycin Nausea Only  . Erythromycin Nausea Only  . Levaquin [Levofloxacin In D5w] Swelling and Other (See Comments)    Feet and legs ache and SWELL  . Peanut-Containing Drug Products Other (See Comments)    Wheezing (boiled or raw peanuts)  . Septra [Sulfamethoxazole-Trimethoprim] Diarrhea  . Telithromycin Nausea Only  . Zanaflex [Tizanidine] Other (See Comments)    Caused the patient to feel "spaced out" and "not well"  . Adhesive [Tape] Rash and Other (See Comments)    Paper works tape works fine   . Epinephrine Palpitations and Other (See Comments)    Also makes the heart race    Current Medications: Current Outpatient Medications  Medication Sig Dispense Refill  . acetaminophen (TYLENOL) 500 MG tablet Take 500-1,000 mg by mouth 2 (two) times daily as needed for moderate pain or headache.     . albuterol (PROVENTIL HFA;VENTOLIN HFA) 108 (90 Base) MCG/ACT inhaler Inhale 2 puffs into the lungs every 6 (six) hours as needed for wheezing or shortness of breath. 1 Inhaler 5  . ALPRAZolam (XANAX) 0.25 MG tablet TAKE ONE-HALF (1/2) TABLET BY MOUTH TWICE DAILY AS NEEDED FOR ANXIETY (Patient taking differently: Take by mouth at bedtime. ) 30 tablet 0  . alum & mag hydroxide-simeth (MAALOX/MYLANTA) 200-200-20 MG/5ML suspension Take 15 mLs by mouth every 6 (six) hours as needed for indigestion or heartburn.    [name] amoxicillin-clavulanate (AUGMENTIN) 500-125 MG tablet Take 0.5 tablets by mouth 3 (three) times daily.     . cetirizine (ZYRTEC) 10 MG tablet TAKE ONE TABLET EVERY DAY (Patient taking  differently: Take 10 mg by mouth at bedtime. ) 30 tablet 5  . clopidogrel (PLAVIX) 75 MG tablet TAKE ONE TABLET EVERY MORNING (Patient taking differently: Take 75 mg by mouth daily. ) 90 tablet 1  . diphenhydrAMINE (BENADRYL) 25 MG tablet Take 25 mg by mouth daily as needed for allergies.    [name] EPINEPHrine (EPIPEN 2-PAK) 0.3 mg/0.3 mL IJ SOAJ injection Inject 0.3 mLs (0.3 mg total) into the muscle once. (Patient taking differently: Inject  0.3 mg into the muscle as needed for anaphylaxis. ) 1 Device 0  . estradiol (ESTRACE) 1 MG tablet TAKE ONE TABLET BY MOUTH EVERY DAY 90 tablet 1  . fluticasone (FLONASE) 50 MCG/ACT nasal spray PLACE 2 SPRAYS INTO BOTH NOSTRILS DAILY. (Patient taking differently: Place 2 sprays into both nostrils daily as needed for allergies. ) 16 g 2  . furosemide (LASIX) 20 MG tablet TAKE ONE TABLET EVERY DAY AS NEEDED 90 tablet 0  . ibuprofen (ADVIL,MOTRIN) 200 MG tablet Take 400-800 mg by mouth daily as needed for headache or moderate pain.    [name] ipratropium-albuterol (DUONEB) 0.5-2.5 (3) MG/3ML SOLN Take 3 mLs by nebulization every 4 (four) hours as needed. Dx 496 (Patient taking differently: Take 3 mLs by nebulization every 4 (four) hours as needed (wheezing or shortness of breath). Dx 496) 120 mL 2  . metoprolol succinate (TOPROL XL) 25 MG 24 hr tablet Take 0.5 tablets (12.5 mg total) by mouth daily. 90 tablet 3  . montelukast (SINGULAIR) 10 MG tablet TAKE 1 TABLET BY MOUTH AT BEDTIME (Patient taking differently: Take 10 mg by mouth at bedtime. ) 90 tablet 1  . mupirocin ointment (BACTROBAN) 2 % Apply 1 application daily as needed topically (FOR [facility] SITE IRRITATION).     . Naphazoline-Pheniramine (OPCON-A) 0.027-0.315 % SOLN Place 1 drop into both eyes daily as needed (for dry eyes).     . pantoprazole (PROTONIX) 40 MG tablet Take 1 tablet (40 mg total) by mouth 2 (two) times daily before a meal. 28 tablet 0  . potassium chloride SA (K-DUR,KLOR-CON) 20 MEQ tablet Take 1 tablet  (20 mEq total) by mouth daily as needed (cramps). 30 tablet 1  . sodium chloride (OCEAN) 0.65 % SOLN nasal spray Place 1 spray into both nostrils every 4 (four) hours as needed for congestion.     . sucralfate (CARAFATE) 1 g tablet Take 1 tablet (1 g total) by mouth 4 (four) times daily -  with meals and at bedtime. 56 tablet 0  . White Petrolatum-Mineral Oil (LUBRICANT EYE) OINT Place 1 application into both eyes 2 (two) times daily as needed (for dry eyes).     [name] guaiFENesin-codeine 100-10 MG/5ML syrup Take 5 mLs by mouth 3 (three) times daily as needed for cough. (Patient not taking: Reported on 07/01/2018) 120 mL 0   No current facility-administered medications for this visit.     Review of Systems  Constitutional: Negative for chills, diaphoresis, fever, malaise/fatigue and weight loss.       Feels "better".  HENT: Negative.  Negative for congestion, nosebleeds, sinus pain and sore throat.   Eyes: Negative.  Negative for blurred vision, double vision and photophobia.  Respiratory: Positive for shortness of breath (chronic related to asthma). Negative for cough, hemoptysis and sputum production.   Cardiovascular: Negative.  Negative for chest pain, palpitations, orthopnea, leg swelling and PND.  Gastrointestinal: Negative for abdominal pain, blood in stool, constipation, diarrhea, melena, nausea and vomiting.       IBS. Gastritis on Carafate and Protonix.  Watches what she eats.  Genitourinary: Negative.  Negative for dysuria, frequency, hematuria and urgency.       Renal lesion under surveillance.  Musculoskeletal: Negative.  Negative for back pain, falls, joint pain, myalgias and neck pain.  Skin: Negative.  Negative for itching and rash.  Neurological: Negative for dizziness, tremors, sensory change, speech change, focal weakness, weakness and headaches.  Endo/Heme/Allergies: Does not bruise/bleed easily.  Psychiatric/Behavioral: Negative.  Negative for depression  and memory loss. The  patient is not nervous/anxious and does not have insomnia.   All other systems reviewed and are negative.  Performance status (ECOG): 0-1   No visits with results within 3 Day(s) from this visit.  Latest known visit with results is:  Appointment on 06/12/2018  Component Date Value Ref Range Status  . Ferritin 06/12/2018 203  11 - 307 ng/mL Final   Performed at [facility], [address] [place], [place] [address]  . WBC 06/12/2018 6.4  4.0 - 10.5 K/uL Final  . RBC 06/12/2018 4.42  3.87 - 5.11 MIL/uL Final  . Hemoglobin 06/12/2018 14.2  12.0 - 15.0 g/dL Final  . HCT 06/12/2018 42.4  36.0 - 46.0 % Final  . MCV 06/12/2018 95.9  80.0 - 100.0 fL Final  . MCH 06/12/2018 32.1  26.0 - 34.0 pg Final  . MCHC 06/12/2018 33.5  30.0 - 36.0 g/dL Final  . RDW 06/12/2018 12.2  11.5 - 15.5 % Final  . Platelets 06/12/2018 195  150 - 400 K/uL Final  . nRBC 06/12/2018 0.0  0.0 - 0.2 % Final  . Neutrophils Relative % 06/12/2018 57  % Final  . Neutro Abs 06/12/2018 3.7  1.7 - 7.7 K/uL Final  . Lymphocytes Relative 06/12/2018 23  % Final  . Lymphs Abs 06/12/2018 1.5  0.7 - 4.0 K/uL Final  . Monocytes Relative 06/12/2018 10  % Final  . Monocytes Absolute 06/12/2018 0.7  0.1 - 1.0 K/uL Final  . Eosinophils Relative 06/12/2018 9  % Final  . Eosinophils Absolute 06/12/2018 0.6* 0.0 - 0.5 K/uL Final  . Basophils Relative 06/12/2018 1  % Final  . Basophils Absolute 06/12/2018 0.1  0.0 - 0.1 K/uL Final  . Immature Granulocytes 06/12/2018 0  % Final  . Abs Immature Granulocytes 06/12/2018 0.02  0.00 - 0.07 K/uL Final   Performed at [facility], [address], [place], [place] [address]    Assessment:  DORLENE FOOTMAN is a 68 y.o. female with iron deficiency anemia.  Diet is poor.  She was unresponsive to oral iron.  EGD on 10/02/2017 revealed a normal stomach and duodenum.  The GE junction was patulous.  Random duodenal biopsies revealed no evidence of sprue, active inflammation  or other disorders.  Colonoscopy on 10/02/2017 was normal.  She received Feraheme 510 mg IV on 10/25/2017 and 10/31/2017.  With her second infusion, she developed an itchy throat.  She received Benadryl and Solumedrol.   Ferritin has been followed:  30.2 on 09/03/2012, 8 on 08/09/2017, 7.2 on 10/15/2017, 255 on 12/11/2017, 213 on 03/13/2018, and 203 on 06/11/2017.  She has vitamin B12 deficiency.  B12 was 621 on 04/28/2014. She receives monthly parenteral B12.   MRI abdomen on 03/29/2017 revealed a stable to minimal increase in size of the 1.2 cm enhancing lesion arising from the upper pole of left kidney which is suspicious for a small renal neoplasm.  [facility] of [facility] abdomen at [facility] on 10/22/2017 revealed a 1.4 cm T2 hyperintense, T1 hypointense with central nodule/scar in the left superior pole.  Size was similar to prior imaging.  A small renal cell carcinoma or oncocytoma was considered.  She is followed by Dr. Louanne Skye at [facility].  Symptomatically, she feels better.  Gastritis has improved on Carafate and Protonix.  She denies any bleeding.  Plan: 1. Review labs from 06/12/2018. 2. Iron deficiency anemia Hemoglobin 14.2.  MCV 95.9. Ferritin was 203 on 06/12/2018. 3.  Left upper pole renal lesion  Review results of scans at [facility] on 10/22/2017.  Lesion is either a small renal cell carcinoma or oncocytoma.  Continue surveillance. 4.   RTC in 3 months for labs (CBC with diff, ferritin). 5.   RTC in 6 months for MD assessment, labs (CBC with diff, ferritin - day before), and +/- Venofer.   I discussed the assessment and treatment plan with the patient.  The patient was provided an opportunity to ask questions and all were answered.  The patient agreed with the plan and demonstrated an understanding of the instructions.  The patient was advised to call back or seek an in person evaluation if the symptoms worsen or if the condition fails to improve as anticipated.  I provided 14 minutes  (1:30 PM - 1:44 PM) of non-face-to-face time during this encounter.  I provided these services from the [facility] office.   Lequita Asal, MD, PhD  07/01/2018, 1:30 PM

## 2018-07-01 ENCOUNTER — Ambulatory Visit: Payer: Medicare Other

## 2018-07-01 ENCOUNTER — Inpatient Hospital Stay: Payer: Medicare Other | Attending: Hematology and Oncology | Admitting: Hematology and Oncology

## 2018-07-01 DIAGNOSIS — E538 Deficiency of other specified B group vitamins: Secondary | ICD-10-CM

## 2018-07-01 DIAGNOSIS — N289 Disorder of kidney and ureter, unspecified: Secondary | ICD-10-CM | POA: Diagnosis not present

## 2018-07-01 DIAGNOSIS — D509 Iron deficiency anemia, unspecified: Secondary | ICD-10-CM

## 2018-07-01 NOTE — Progress Notes (Signed)
Confirmed name, DOB, and Address. Patient is currently taking Augmentin d/t possible Gastritis per patient. Patient would also like to discuss Iron levels.

## 2018-07-07 ENCOUNTER — Encounter: Payer: Self-pay | Admitting: Hematology and Oncology

## 2018-07-09 ENCOUNTER — Ambulatory Visit (INDEPENDENT_AMBULATORY_CARE_PROVIDER_SITE_OTHER): Payer: Medicare Other

## 2018-07-09 ENCOUNTER — Other Ambulatory Visit: Payer: Self-pay

## 2018-07-09 ENCOUNTER — Other Ambulatory Visit: Payer: Self-pay | Admitting: Family Medicine

## 2018-07-09 ENCOUNTER — Encounter: Payer: Self-pay | Admitting: Family Medicine

## 2018-07-09 DIAGNOSIS — E538 Deficiency of other specified B group vitamins: Secondary | ICD-10-CM

## 2018-07-09 MED ORDER — CYANOCOBALAMIN 1000 MCG/ML IJ SOLN
1000.0000 ug | Freq: Once | INTRAMUSCULAR | Status: AC
  Administered 2018-07-09: 1000 ug via INTRAMUSCULAR

## 2018-07-09 NOTE — Telephone Encounter (Signed)
Sent to PCP ?

## 2018-07-09 NOTE — Progress Notes (Signed)
Pt presented today for B-12 injection.  Administered IM in left deltoid.  Patient tolerated well.

## 2018-07-10 NOTE — Telephone Encounter (Signed)
Dr. Alva Garnet please advise. Thanks

## 2018-07-17 ENCOUNTER — Other Ambulatory Visit: Payer: Self-pay | Admitting: Internal Medicine

## 2018-07-19 ENCOUNTER — Other Ambulatory Visit: Payer: Self-pay | Admitting: Family Medicine

## 2018-07-29 ENCOUNTER — Encounter: Payer: Self-pay | Admitting: *Deleted

## 2018-07-30 ENCOUNTER — Ambulatory Visit (INDEPENDENT_AMBULATORY_CARE_PROVIDER_SITE_OTHER): Payer: Medicare Other | Admitting: Internal Medicine

## 2018-07-30 ENCOUNTER — Other Ambulatory Visit: Payer: Self-pay

## 2018-07-30 VITALS — Ht 63.0 in | Wt 102.0 lb

## 2018-07-30 DIAGNOSIS — R1310 Dysphagia, unspecified: Secondary | ICD-10-CM

## 2018-07-30 DIAGNOSIS — K224 Dyskinesia of esophagus: Secondary | ICD-10-CM

## 2018-07-30 DIAGNOSIS — D509 Iron deficiency anemia, unspecified: Secondary | ICD-10-CM | POA: Diagnosis not present

## 2018-07-30 DIAGNOSIS — R140 Abdominal distension (gaseous): Secondary | ICD-10-CM

## 2018-07-30 DIAGNOSIS — K220 Achalasia of cardia: Secondary | ICD-10-CM | POA: Diagnosis not present

## 2018-07-30 DIAGNOSIS — R1319 Other dysphagia: Secondary | ICD-10-CM

## 2018-07-30 DIAGNOSIS — K9289 Other specified diseases of the digestive system: Secondary | ICD-10-CM

## 2018-07-30 NOTE — Progress Notes (Signed)
Subjective:    Patient ID: Anne [name], female    DOB: 03-Dec-1950, 68 y.o.   MRN: [id number]  This service was provided via telemedicine.   Telephone encounter. The patient was located at home The provider was located in provider's GI office. The patient did consent to this telephone visit and is aware of possible charges through their insurance for this visit.   The persons participating in this telemedicine service were the patient and I. Time spent on call: 20 minutes  HPI Carlene Bickley is a 68 year old female with a history of esophageal dysphagia with hypertensive LES responsive to Botox injection, chronic constipation with abdominal pain, hx IDA, pulm fibrosis, vocal cord dysfunction, permanent trach, hx TIA, migraine, here for follow-up.    She had an upper endoscopy and colonoscopy performed in July 2019 to evaluate iron deficiency anemia.  This was unremarkable.  Her colonoscopy was normal completely.  Then in November 2019 I performed repeat upper endoscopy in the outpatient hospital setting to perform Botox to the LES for dysphagia and hypertensive LES.  This helped that symptom dramatically.  Her biggest complaint has been about every 6 months having episodes of abdominal pain.  She describes this as an upper abdominal and mid abdominal pain.  She reports it feels like someone is putting a belt around her waist and pulling it really tight.  She has pain under her rib cages and more on the right than the left and this radiates from the front to the back.  There is abdominal bloating associated with this.  The only relieving factor she knows is to not eat.  She is previously had a cholecystectomy.  She tried IBgard without any benefit.  She is also been trying to avoid ibuprofen.  She has been trying to take probiotic in the form of yogurt.  In the past she seemed to respond antibiotic therapy and so I treated her with Augmentin therapy which she felt helped some.  She then had a  Z-Pak at home and took a 5-day azithromycin Z-Pak which she felt definitely helped.  The pain is not completely gone but much much better at this point.  Her bowel movements have been fairly regular.  She is using pantoprazole 40 mg once a day.  She does feel like sucralfate helps this pain and she uses this as needed 3 times a day and sometimes at bedtime.  She is following up with hematology in [place] and has received iron replacement.  Most recently her ferritin was normal and she did not need further iron therapy.  She is also being followed at [facility] for a left renal lesion which is less than 2 cm but may represent a small neoplasm.  She has a rescheduled MRI for surveillance of this lesion in June 2020.  Review of Systems As per HPI, otherwise negative  Current Medications, Allergies, Past Medical History, Past Surgical History, Family History and Social History were reviewed in [employer] record.     Objective:   Physical Exam No PE, virtual visit  CBC    Component Value Date/Time   WBC 6.4 06/12/2018 1036   RBC 4.42 06/12/2018 1036   HGB 14.2 06/12/2018 1036   HGB 12.6 02/17/2014 1303   HCT 42.4 06/12/2018 1036   HCT 37.9 02/17/2014 1303   PLT 195 06/12/2018 1036   PLT 208 02/17/2014 1303   MCV 95.9 06/12/2018 1036   MCV 97 02/17/2014 1303   MCH 32.1 06/12/2018 1036  MCHC 33.5 06/12/2018 1036   RDW 12.2 06/12/2018 1036   RDW 12.5 02/17/2014 1303   LYMPHSABS 1.5 06/12/2018 1036   LYMPHSABS 1.2 02/17/2014 1303   MONOABS 0.7 06/12/2018 1036   MONOABS 0.3 02/17/2014 1303   EOSABS 0.6 (H) 06/12/2018 1036   EOSABS 0.2 02/17/2014 1303   BASOSABS 0.1 06/12/2018 1036   BASOSABS 0.0 02/17/2014 1303   CMP     Component Value Date/Time   NA 140 05/07/2018 1327   K 3.2 (L) 05/07/2018 1327   K 3.6 04/03/2012 1653   CL 103 05/07/2018 1327   CO2 26 05/07/2018 1327   GLUCOSE 84 05/07/2018 1327   BUN 13 05/07/2018 1327   CREATININE 1.01 (H)  05/07/2018 1327   CREATININE 0.94 10/30/2016 1135   CALCIUM 9.6 05/07/2018 1327   PROT 6.3 06/22/2017 1514   ALBUMIN 3.5 06/22/2017 1514   AST 20 06/22/2017 1514   ALT 14 06/22/2017 1514   ALKPHOS 110 06/22/2017 1514   BILITOT 0.4 06/22/2017 1514   GFRNONAA 57 (L) 05/07/2018 1327   GFRAA >60 05/07/2018 1327   Iron/TIBC/Ferritin/ %Sat    Component Value Date/Time   IRON 93 12/11/2017 1028   TIBC 315 12/11/2017 1028   FERRITIN 203 06/12/2018 1036   IRONPCTSAT 30 12/11/2017 1028   IRONPCTSAT 15 08/09/2017 0946   Procedure: MRI Abdomen with and without contrast  Indication: 54 years y/o Female with Renal mass, normal renal function, left renal mass, surveillance, N28.89 Other specified disorders of kidney and ureter  Comparison: MRI abdomen 03/29/2017.  Technique: Precontrast and dynamic postcontrast MR imaging of the abdomen was performed using the Liver Protocol. IV contrast was administered to improve disease detection and further define anatomy.  Contrast agent: 9 mL of MultiHance administered intravenously. eGFR: 58  Premedication/adverse events: None.  Findings:  The lung bases are unremarkable.  The liver is normal in size, contour and intensity. No intrahepatic or extrahepatic biliary ductal dilatation. No focal hepatic lesions seen. The pancreas, spleen and adrenal glands are within normal limits.  Symmetric renal parenchymal enhancement with no collecting system dilatation. Limited evaluation of the left superior pole secondary to motion artifact from the diaphragm. Left superior pole 1.4 cm T2 hyperintense, T1 hypointense with central enhancing nodule/scar is grossly unchanged compared to prior examination. No adenopathy seen within the abdomen. The bowel appears unremarkable. Abdominal aorta and IVC are within normal limits.  Impression: Similar size and appearance of left superior pole lesion with a central enhancing nodule/scar. This can be seen in  the setting of small RCC vs oncocytoma.  Electronically Reviewed by: Randel Pigg, MD, [facility] Electronically Reviewed on: 10/22/2017 10:00 AM  I have reviewed the images and concur with the above findings.  Electronically Signed by: Scarlette Shorts, MD, [facility] Electronically Signed on: 10/22/2017 10:58 AM      Assessment & Plan:  68 year old female with a history of esophageal dysphagia with hypertensive LES responsive to Botox injection, chronic constipation with abdominal pain, hx IDA, pulm fibrosis, vocal cord dysfunction, permanent trach, hx TIA, migraine, here for follow-up.    1.  Intermittent upper abdominal pain and history of IBS --difficult to know the exact etiology of her symptoms.  It seems to respond to antibiotic therapy which suggest that there may be an element of bacterial overgrowth.  Certainly I feel that this is due to disruption of her gut micro-biome and likely affects her GI motility.  She very well may have a small and large bowel dysmotility syndrome.  At this point it has improved. --She will remain off antibiotics --Continue probiotic daily --Can use sucralfate 1 g 3 times daily before meals and at bedtime as needed  2.  GERD/esophageal dysmotility/hypertensive LES --responsive to Botox therapy and doing well after Botox in November 2019.  We will continue daily PPI. --Pantoprazole 40 mg daily --Can repeat LES Botox therapy when needed  3.  IDA --following with hematology in [place], IV iron when needed  Can follow-up in 3 to 6 months, sooner if needed

## 2018-07-30 NOTE — Patient Instructions (Signed)
Remain off antibiotics.  Continue a probiotic daily.  Use sucralfate 1 g 3 times daily before meals and at bedtime as needed  Continue Pantoprazole 40 mg daily.  Continue following up with hematology in [place], use IV iron when needed.  Please follow-up with Dr Hilarie Fredrickson in 3 to 6 months, sooner if needed.

## 2018-08-05 ENCOUNTER — Encounter: Payer: Self-pay | Admitting: Family Medicine

## 2018-08-05 ENCOUNTER — Other Ambulatory Visit: Payer: Self-pay

## 2018-08-05 ENCOUNTER — Ambulatory Visit (INDEPENDENT_AMBULATORY_CARE_PROVIDER_SITE_OTHER): Payer: Medicare Other | Admitting: Family Medicine

## 2018-08-05 ENCOUNTER — Telehealth: Payer: Self-pay | Admitting: Family Medicine

## 2018-08-05 DIAGNOSIS — J8410 Pulmonary fibrosis, unspecified: Secondary | ICD-10-CM

## 2018-08-05 DIAGNOSIS — M5441 Lumbago with sciatica, right side: Secondary | ICD-10-CM

## 2018-08-05 DIAGNOSIS — M5442 Lumbago with sciatica, left side: Secondary | ICD-10-CM

## 2018-08-05 DIAGNOSIS — F419 Anxiety disorder, unspecified: Secondary | ICD-10-CM | POA: Diagnosis not present

## 2018-08-05 DIAGNOSIS — R140 Abdominal distension (gaseous): Secondary | ICD-10-CM

## 2018-08-05 DIAGNOSIS — Z8673 Personal history of transient ischemic attack (TIA), and cerebral infarction without residual deficits: Secondary | ICD-10-CM

## 2018-08-05 DIAGNOSIS — G8929 Other chronic pain: Secondary | ICD-10-CM

## 2018-08-05 MED ORDER — POTASSIUM CHLORIDE CRYS ER 20 MEQ PO TBCR: 20.0000 meq | EXTENDED_RELEASE_TABLET | Freq: Every day | ORAL | 1 refills | Status: DC | PRN

## 2018-08-05 MED ORDER — CLOPIDOGREL BISULFATE 75 MG PO TABS: 75.0000 mg | ORAL_TABLET | Freq: Every day | ORAL | 3 refills | Status: DC

## 2018-08-05 MED ORDER — FUROSEMIDE 20 MG PO TABS: ORAL_TABLET | ORAL | 1 refills | Status: DC

## 2018-08-05 NOTE — Assessment & Plan Note (Signed)
No recurrence.  Continue Plavix.

## 2018-08-05 NOTE — Assessment & Plan Note (Signed)
Chronic issue.  She will continue to follow with her surgeon.  Has been improving from recent flare.

## 2018-08-05 NOTE — Telephone Encounter (Signed)
Please contact the patient and get her set up for follow-up in 4 months.  Thanks.

## 2018-08-05 NOTE — Assessment & Plan Note (Signed)
" >>  ASSESSMENT AND PLAN FOR PULMONARY FIBROSIS (HCC) WRITTEN ON 08/05/2018  5:24 PM BY Saray Capasso G, MD  Stable.  Discussed social distancing precautions and sick precautions regarding COVID-19.  She will monitor. "

## 2018-08-05 NOTE — Assessment & Plan Note (Signed)
Continues to be an intermittent issue.  May be related to IBS or some other cause.  She will continue to follow with GI.

## 2018-08-05 NOTE — Progress Notes (Signed)
Robaxin 750 mg take 1 tablet 3 times daily for pulled muscle - no longer taking now    Carafate 1 gram - taking as needed for dysmotility  Rx's by [facility] in [place]

## 2018-08-05 NOTE — Assessment & Plan Note (Addendum)
Stable.  She is on such a small dose of Xanax that I discussed tapering her off of this.  She will take half a tablet every other day for 2 weeks and then discontinue.  If she has worsening of symptoms she will contact us.  Offered support regarding the loss of her brother.

## 2018-08-05 NOTE — Assessment & Plan Note (Signed)
Stable.  Discussed social distancing precautions and sick precautions regarding COVID-19.  She will monitor.

## 2018-08-05 NOTE — Progress Notes (Signed)
Virtual Visit via telephone note  This visit type was conducted due to national recommendations for restrictions regarding the COVID-19 pandemic (e.g. social distancing).  This format is felt to be most appropriate for this patient at this time.  All issues noted in this document were discussed and addressed.  No physical exam was performed (except for noted visual exam findings with Video Visits).   I connected with Anne [name] today at  9:30 AM EDT by telephone and verified that I am speaking with the correct person using two identifiers. Location patient: home Location provider: work  Persons participating in the virtual visit: patient, provider  I discussed the limitations, risks, security and privacy concerns of performing an evaluation and management service by telephone and the availability of in person appointments. I also discussed with the patient that there may be a patient responsible charge related to this service. The patient expressed understanding and agreed to proceed.  Reason for visit: follow-up  HPI: History of TIA: She notes no recurrent TIA symptoms.  No numbness or weakness.  She is currently on Plavix.  Anxiety: She notes this has been stable.  She takes Xanax 0.125 mg nightly.  No drowsiness.  No alcohol intake.  Had a lot to deal with late last year as her brother passed away.  He had been an alcoholic and was found dead in the woods.  She reports he had been there for some time.  Notes this was very difficult for her to deal with.  She notes while he was alive she did try to help him on numerous occasions.  Back pain: Patient underwent back surgery.  She does note she pulled a muscle in her back previously and did discuss it with her neurosurgeons office.  They sent in a muscle relaxer and it has started to improve at this point.  Pulmonary fibrosis: Patient reports her breathing is stable.  Typically her allergies will trigger a flare and she is monitoring it.   Abdominal bloating: Patient reports she has dysmotility in her intestines.  She spoke with her GI physician who advised her she should call earlier next time to get treated.  She notes when she feels bloated it feels as though there is a belt around her waist.  She was placed on a probiotic, Carafate, and Protonix.  She does note her symptoms have improved over the last couple of days.   ROS: See pertinent positives and negatives per HPI.  Past Medical History:  Diagnosis Date  . Allergic rhinitis   . Allergy    multiple, mostly aspirin, levaquin and shellfish.  . Anemia   . Asthma   . Cataract 2014   exraction with len implant  . Complication of anesthesia    Breathing problems upon waking up. 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  . Compressed cervical disc   . COPD (chronic obstructive pulmonary disease) ([place])   . CVA (cerebral infarction)   . Dyspnea   . Esophageal dysmotility   . Heart murmur    as child  . History of hiatal hernia   . IBS (irritable bowel syndrome)   . Migraine   . PICC (peripherally inserted central catheter) removal 02/20/2017  . PONV (postoperative nausea and vomiting)   . Problems with swallowing    intermittently  . Pulmonary fibrosis ([place])   . Shingles   . Stroke [facility])    slurred speech, drawn face, imaging normal, occurred twice, [facility]-"TIA" if antything" 02/20/17-no residual  effects  . Tracheostomy in place [facility])   . Vocal cord paresis     Past Surgical History:  Procedure Laterality Date  . ABDOMINAL HYSTERECTOMY    . APPLICATION OF A-CELL OF HEAD/NECK N/A 02/21/2017   Procedure: APPLICATION OF A-CELL OF HEAD/NECK;  Surgeon: Wallace Going, DO;  Location: [place];  Service: Plastics;  Laterality: N/A;  . BOTOX INJECTION N/A 07/29/2013   Procedure: BOTOX INJECTION;  Surgeon: Jerene Bears, MD;  [facility];  Service: Gastroenterology;  Laterality: N/A;  . BOTOX INJECTION N/A 05/04/2015   Procedure:  BOTOX INJECTION;  Surgeon: Jerene Bears, MD;  [facility];  Service: Gastroenterology;  Laterality: N/A;  . BOTOX INJECTION N/A 02/18/2018   Procedure: BOTOX INJECTION;  Surgeon: Jerene Bears, MD;  [facility];  Service: Gastroenterology;  Laterality: N/A;  . BREAST BIOPSY Left    neg  . BREAST SURGERY Left 2002   bx of skin  . CHOLECYSTECTOMY    . COLONOSCOPY    . ESOPHAGEAL MANOMETRY N/A 12/16/2012   Procedure: ESOPHAGEAL MANOMETRY (EM);  Surgeon: Jerene Bears, MD;  [facility];  Service: Gastroenterology;  Laterality: N/A;  . ESOPHAGOGASTRODUODENOSCOPY (EGD) WITH PROPOFOL N/A 07/29/2013   Procedure: ESOPHAGOGASTRODUODENOSCOPY (EGD) WITH PROPOFOL;  Surgeon: Jerene Bears, MD;  [facility];  Service: Gastroenterology;  Laterality: N/A;  with botox injection  . ESOPHAGOGASTRODUODENOSCOPY (EGD) WITH PROPOFOL N/A 05/04/2015   Procedure: ESOPHAGOGASTRODUODENOSCOPY (EGD) WITH PROPOFOL;  Surgeon: Jerene Bears, MD;  [facility];  Service: Gastroenterology;  Laterality: N/A;  . ESOPHAGOGASTRODUODENOSCOPY (EGD) WITH PROPOFOL N/A 02/18/2018   Procedure: ESOPHAGOGASTRODUODENOSCOPY (EGD) WITH PROPOFOL;  Surgeon: Jerene Bears, MD;  [facility];  Service: Gastroenterology;  Laterality: N/A;  . EYE SURGERY     Catarct surgery 2014  . Eye Surgery AS Child Left   . INCISION AND DRAINAGE OF WOUND N/A 02/21/2017   Procedure: IRRIGATION AND DEBRIDEMENT WOUND NECK;  Surgeon: Wallace Going, DO;  Location: [place];  Service: Plastics;  Laterality: N/A;  . JEJUNOSTOMY FEEDING TUBE     x2 both failed. no longer has  . LUMBAR LAMINECTOMY/DECOMPRESSION MICRODISCECTOMY Bilateral 05/13/2018   Procedure: Laminectomy and Foraminotomy - Lumbar four-Lumbar five- bilateral;  Surgeon: Eustace Moore, MD;  Location: [place];  Service: Neurosurgery;  Laterality: Bilateral;  . MULTIPLE TOOTH EXTRACTIONS     2 teeth removed  . OOPHORECTOMY    . POSTERIOR  CERVICAL FUSION/FORAMINOTOMY N/A 01/10/2017   Procedure: LAMINECTOMY AND FORAMINOTOMY CERVICAL FOUR-CERVICAL FIVE, CERVICAL FIVE-SIX POSTERIOR CERVICAL INSTRUMENT FUSION CERVICAL THREE-CERVICAL SEVEN,CERVICAL LAMINECTOMY CERVICAL THREE-CERVICAL SEVEN.;  Surgeon: Eustace Moore, MD;  Location: [place];  Service: Neurosurgery;  Laterality: N/A;  posterior  . TRACHEOSTOMY  1996   done at [facility], Dr. Kathyrn Sheriff  . TUBAL LIGATION    . VIDEO BRONCHOSCOPY Bilateral 11/20/2012   Procedure: VIDEO BRONCHOSCOPY WITH FLUORO;  Surgeon: Juanito Doom, MD;  [facility];  Service: Cardiopulmonary;  Laterality: Bilateral;    Family History  Problem Relation Age of Onset  . Asthma Cousin   . COPD Cousin   . Breast cancer Maternal Grandmother 60  . Asthma Father   . Kidney cancer Father   . Arrhythmia Father   . Lung cancer Father   . Lung cancer Paternal Uncle   . COPD Paternal Grandfather   . Breast cancer Maternal Aunt 52  . Bladder Cancer Neg Hx   . Colon cancer Neg Hx   . Esophageal  cancer Neg Hx   . Pancreatic cancer Neg Hx   . Stomach cancer Neg Hx   . Liver disease Neg Hx     SOCIAL HX: Non-smoker.   Current Outpatient Medications:  .  acetaminophen (TYLENOL) 500 MG tablet, Take 500-1,000 mg by mouth 2 (two) times daily as needed for moderate pain or headache. , Disp: , Rfl:  .  albuterol (PROVENTIL HFA;VENTOLIN HFA) 108 (90 Base) MCG/ACT inhaler, Inhale 2 puffs into the lungs every 6 (six) hours as needed for wheezing or shortness of breath., Disp: 1 Inhaler, Rfl: 5 .  cetirizine (ZYRTEC) 10 MG tablet, TAKE ONE TABLET EVERY DAY, Disp: 30 tablet, Rfl: 5 .  clopidogrel (PLAVIX) 75 MG tablet, Take 1 tablet (75 mg total) by mouth daily., Disp: 90 tablet, Rfl: 3 .  diphenhydrAMINE (BENADRYL) 25 MG tablet, Take 25 mg by mouth daily as needed for allergies., Disp: , Rfl:  .  EPINEPHrine (EPIPEN 2-PAK) 0.3 mg/0.3 mL IJ SOAJ injection, Inject 0.3 mLs (0.3 mg total) into the muscle once.,  Disp: 1 Device, Rfl: 0 .  estradiol (ESTRACE) 1 MG tablet, TAKE ONE TABLET BY MOUTH EVERY DAY, Disp: 90 tablet, Rfl: 1 .  fluticasone (FLONASE) 50 MCG/ACT nasal spray, PLACE 2 SPRAYS INTO BOTH NOSTRILS DAILY. (Patient taking differently: Place 2 sprays into both nostrils daily as needed for allergies. ), Disp: 16 g, Rfl: 2 .  furosemide (LASIX) 20 MG tablet, TAKE ONE TABLET EVERY DAY AS NEEDED, Disp: 90 tablet, Rfl: 1 .  ibuprofen (ADVIL,MOTRIN) 200 MG tablet, Take 400-800 mg by mouth daily as needed for headache or moderate pain., Disp: , Rfl:  .  ipratropium-albuterol (DUONEB) 0.5-2.5 (3) MG/3ML SOLN, Take 3 mLs by nebulization every 4 (four) hours as needed. Dx 496 (Patient taking differently: Take 3 mLs by nebulization every 4 (four) hours as needed (wheezing or shortness of breath). Dx 496), Disp: 120 mL, Rfl: 2 .  metoprolol succinate (TOPROL XL) 25 MG 24 hr tablet, Take 0.5 tablets (12.5 mg total) by mouth daily., Disp: 90 tablet, Rfl: 3 .  montelukast (SINGULAIR) 10 MG tablet, TAKE ONE TABLET AT BEDTIME, Disp: 90 tablet, Rfl: 1 .  mupirocin ointment (BACTROBAN) 2 %, Apply 1 application daily as needed topically (FOR [facility] SITE IRRITATION). , Disp: , Rfl:  .  Naphazoline-Pheniramine (OPCON-A) 0.027-0.315 % SOLN, Place 1 drop into both eyes daily as needed (for dry eyes). , Disp: , Rfl:  .  pantoprazole (PROTONIX) 40 MG tablet, Take 40 mg by mouth 2 (two) times daily., Disp: , Rfl:  .  potassium chloride SA (K-DUR) 20 MEQ tablet, Take 1 tablet (20 mEq total) by mouth daily as needed (cramps)., Disp: 30 tablet, Rfl: 1 .  sodium chloride (OCEAN) 0.65 % SOLN nasal spray, Place 1 spray into both nostrils every 4 (four) hours as needed for congestion. , Disp: , Rfl:  .  sucralfate (CARAFATE) 1 g tablet, Take 1 tablet (1 g total) by mouth 4 (four) times daily -  with meals and at bedtime., Disp: 56 tablet, Rfl: 0 .  White Petrolatum-Mineral Oil (LUBRICANT EYE) OINT, Place 1 application into both eyes 2  (two) times daily as needed (for dry eyes). , Disp: , Rfl:  .  guaiFENesin-codeine 100-10 MG/5ML syrup, Take 5 mLs by mouth 3 (three) times daily as needed for cough. (Patient not taking: Reported on 08/05/2018), Disp: 120 mL, Rfl: 0  EXAM: This is a telehealth telephone visit and thus no physical exam was completed.  ASSESSMENT  AND PLAN:  Discussed the following assessment and plan:  Pulmonary fibrosis (HCC)  Abdominal bloating  Anxiety  Chronic bilateral low back pain with bilateral sciatica  History of TIA (transient ischemic attack)  Pulmonary fibrosis (HCC) Stable.  Discussed social distancing precautions and sick precautions regarding COVID-19.  She will monitor.  Abdominal bloating Continues to be an intermittent issue.  May be related to IBS or some other cause.  She will continue to follow with GI.  Anxiety Stable.  She is on such a small dose of Xanax that I discussed tapering her off of this.  She will take half a tablet every other day for 2 weeks and then discontinue.  If she has worsening of symptoms she will contact us.  Offered support regarding the loss of her brother.  Low back pain Chronic issue.  She will continue to follow with her surgeon.  Has been improving from recent flare.  History of TIA (transient ischemic attack) No recurrence.  Continue Plavix.   CMA will contact patient to schedule follow-up in 4 months.  Social distancing precautions and sick precautions given regarding COVID-19.  I discussed the assessment and treatment plan with the patient. The patient was provided an opportunity to ask questions and all were answered. The patient agreed with the plan and demonstrated an understanding of the instructions.   The patient was advised to call back or seek an in-person evaluation if the symptoms worsen or if the condition fails to improve as anticipated.  I provided 37 minutes of non-face-to-face time during this encounter.   Tommi Rumps, MD

## 2018-08-08 ENCOUNTER — Ambulatory Visit (INDEPENDENT_AMBULATORY_CARE_PROVIDER_SITE_OTHER): Payer: Medicare Other

## 2018-08-08 ENCOUNTER — Other Ambulatory Visit: Payer: Self-pay

## 2018-08-08 DIAGNOSIS — E538 Deficiency of other specified B group vitamins: Secondary | ICD-10-CM | POA: Diagnosis not present

## 2018-08-08 MED ORDER — CYANOCOBALAMIN 1000 MCG/ML IJ SOLN
1000.0000 ug | Freq: Once | INTRAMUSCULAR | Status: AC
  Administered 2018-08-08: 1000 ug via INTRAMUSCULAR

## 2018-08-08 NOTE — Progress Notes (Signed)
Anne [name] presents today for injection per MD orders. B12 injection administered IM in left Upper Arm. Administration without incident. Patient tolerated well.  Kadir Azucena,cma

## 2018-08-09 NOTE — Telephone Encounter (Signed)
appointment has been made

## 2018-08-12 ENCOUNTER — Other Ambulatory Visit: Payer: Self-pay | Admitting: Internal Medicine

## 2018-08-24 ENCOUNTER — Other Ambulatory Visit: Payer: Self-pay | Admitting: Family Medicine

## 2018-08-26 ENCOUNTER — Telehealth: Payer: Self-pay | Admitting: Internal Medicine

## 2018-08-26 MED ORDER — RIFAXIMIN 550 MG PO TABS: 550.0000 mg | ORAL_TABLET | Freq: Three times a day (TID) | ORAL | 0 refills | Status: DC

## 2018-08-26 NOTE — Telephone Encounter (Signed)
Pt states Dr. Hilarie Fredrickson told her to call when her IBS was causing issues and he would put her on a different antibiotic. Pt states she started having issues last week alternating between constipation and diarrhea. Also states she is having the same abdominal pain in her lower abdomen. Pt calling to see if Dr. Hilarie Fredrickson wants to put her on a different antibiotic. Please advise.

## 2018-08-26 NOTE — Telephone Encounter (Signed)
Pt aware that we are trying to get her samples. Rx sent in to see how much medication will be in case we are unable to obtain samples.

## 2018-08-26 NOTE — Telephone Encounter (Signed)
Given variable response to previous abx, would wait to see if samples can be obtained If patient's symptoms are severe, then she should let me know (if this occurs before rifaximin samples obtained)

## 2018-08-26 NOTE — Telephone Encounter (Signed)
Would like her to try rifaximin 550 3 times daily x14 days which was not previously covered Any way we could get her some samples?

## 2018-08-26 NOTE — Telephone Encounter (Signed)
Pt called and wanted to speak with the nurse about the symptoms that she is having ibs symptoms and may need some antibiotics called in

## 2018-08-26 NOTE — Telephone Encounter (Signed)
Anne [name] is working on getting some samples but may not have them until the end of the week. Do you want pt to wait to see if we can get some samples or do you want something else for pt?

## 2018-08-28 ENCOUNTER — Telehealth: Payer: Self-pay | Admitting: Internal Medicine

## 2018-08-28 NOTE — Telephone Encounter (Signed)
Patient called in wanting to speak with the nurse about the medication rifaximin (XIFAXAN) 550 MG TABS tablet [[id number]]  She stated that even with her insurance its still 600.00 and that is expensive. She wants to know if there anything else that she she can try that is less expensive.

## 2018-08-28 NOTE — Telephone Encounter (Signed)
I have spoken with patient to reiterate that we are working on samples for her but we sent rx just to see if insurance would cover this time. Patient is very appreciative of our help. I asked that she call back for update on samples if she has not heard back from [place] by end of this week.

## 2018-08-28 NOTE — Telephone Encounter (Signed)
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

## 2018-08-28 NOTE — Telephone Encounter (Signed)
I think Vaughan Basta is already working to get samples of xifaxan and it appears that this was already explained to patient... Vaughan Basta, did you have any additional conversation about this? Do I need to send to Dr Hilarie Fredrickson for further instruction or can I just contact her to reiterate that you are working on getting samples when available?

## 2018-08-28 NOTE — Telephone Encounter (Signed)
Yes Anne [name] is trying to get samples for her. I sent in the script so she could see how much it was going to be if we cannot get samples. She stated she wanted to wait to see if we were going to get them. Please review with her, thanks.

## 2018-08-29 NOTE — Telephone Encounter (Signed)
I have spoken to patient to advise that her samples are ready for pick up.

## 2018-09-06 ENCOUNTER — Telehealth: Payer: Self-pay | Admitting: Pulmonary Disease

## 2018-09-06 NOTE — Telephone Encounter (Signed)
Called patient for COVID-19 pre-screening for in office visit.  Have you recently traveled any where out of the local area in the last 2 weeks? NO Have you been in close contact with a person diagnosed with COVID-19 within the last 2 weeks? NO  Do you currently have any of the following symptoms? If so, when did they start? Cough (YES- allergies)  Diarrhea             Joint Pain Fever      Muscle Pain  Red eyes Shortness of breath   Abdominal pain Vomiting Loss of smell    Rash   Sore Throat Headache    Weakness  Bruising or bleeding

## 2018-09-09 ENCOUNTER — Other Ambulatory Visit: Payer: Self-pay

## 2018-09-09 ENCOUNTER — Other Ambulatory Visit: Payer: Self-pay | Admitting: Internal Medicine

## 2018-09-09 ENCOUNTER — Ambulatory Visit (INDEPENDENT_AMBULATORY_CARE_PROVIDER_SITE_OTHER): Payer: Medicare Other | Admitting: Pulmonary Disease

## 2018-09-09 ENCOUNTER — Encounter: Payer: Self-pay | Admitting: Pulmonary Disease

## 2018-09-09 VITALS — BP 110/60 | HR 60 | Temp 97.4°F | Ht 63.0 in | Wt 106.6 lb

## 2018-09-09 DIAGNOSIS — J4530 Mild persistent asthma, uncomplicated: Secondary | ICD-10-CM

## 2018-09-09 DIAGNOSIS — J8410 Pulmonary fibrosis, unspecified: Secondary | ICD-10-CM

## 2018-09-09 DIAGNOSIS — Z930 Tracheostomy status: Secondary | ICD-10-CM

## 2018-09-09 NOTE — Progress Notes (Signed)
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 No frank honeycombing. Findings have progressed since  2014. 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  INTERVAL HISTORY: Last visit 03/13/18.  No major pulmonary events since that time.  Underwent back surgery in interim.  Suffered the death of her brother in interim.  SUBJ: This is a scheduled follow-up.  From a pulmonary point of view she is doing well.  She has no new complaints.  She remains on montelukast daily.  She rarely requires albuterol inhaler (0-2 times per week).  She denies CP, fever, purulent sputum, hemoptysis, LE edema and calf tenderness.  OBJ: Vitals:   09/09/18 0934  BP: 110/60  Pulse: 60  Temp: (!) 97.4 F (36.3 C)  TempSrc: Oral  SpO2: 100%  Weight: 106 lb 9.6 oz (48.4 kg)  Height: 5\' 3"  (1.6 m)  Room air  Gen: NAD HEENT: NCAT, sclerae white Neck: No JVD Lungs: breath sounds full, no wheezes or other adventitious sounds Cardiovascular: RRR, no murmurs Abdomen: Soft, nontender, normal BS Ext: without clubbing, cyanosis, edema Neuro: grossly intact Skin: Limited exam, no lesions noted    DATA: BMP Latest Ref Rng & Units 05/07/2018 02/04/2018 11/01/2017  Glucose 70 - 99 mg/dL 84 87 -  BUN 8 - 23 mg/dL 13 12 -  Creatinine 0.44 - 1.00 mg/dL 1.01(H) 0.99 -  Sodium 135 - 145 mmol/L 140 143 -  Potassium 3.5 - 5.1 mmol/L 3.2(L) 3.6 3.7  Chloride 98 - 111 mmol/L 103 104 -  CO2 22 - 32 mmol/L 26 32 -  Calcium 8.9 - 10.3 mg/dL 9.6 9.7 -   CBC Latest Ref Rng & Units 06/12/2018  05/07/2018 03/13/2018  WBC 4.0 - 10.5 K/uL 6.4 7.2 4.7  Hemoglobin 12.0 - 15.0 g/dL 14.2 13.6 13.9  Hematocrit 36.0 - 46.0 % 42.4 42.0 41.2  Platelets 150 - 400 K/uL 195 221 211    CXR: No new film  IMPRESSION: 1) chronic, 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  Call sooner if needed   Merton Border, MD PCCM service Mobile [phone] Pager [phone] 09/09/2018 1:11 PM

## 2018-09-09 NOTE — Patient Instructions (Signed)
Continue tracheostomy tube care as you are currently doing Continue montelukast 10 mg daily Continue albuterol as needed Follow-up in 6 months.  Call sooner if needed

## 2018-09-10 ENCOUNTER — Ambulatory Visit (INDEPENDENT_AMBULATORY_CARE_PROVIDER_SITE_OTHER): Payer: Medicare Other

## 2018-09-10 ENCOUNTER — Other Ambulatory Visit: Payer: Self-pay

## 2018-09-10 DIAGNOSIS — E538 Deficiency of other specified B group vitamins: Secondary | ICD-10-CM | POA: Diagnosis not present

## 2018-09-10 MED ORDER — CYANOCOBALAMIN 1000 MCG/ML IJ SOLN
1000.0000 ug | Freq: Once | INTRAMUSCULAR | Status: AC
  Administered 2018-09-10: 1000 ug via INTRAMUSCULAR

## 2018-09-10 NOTE — Progress Notes (Signed)
[name] presents today for injection per MD orders. B12 injection administered IM in right Upper Arm. Administration without incident. Patient tolerated well.  Nina,cma

## 2018-09-12 NOTE — Telephone Encounter (Signed)
Dr. Alva Garnet please see below attachments.

## 2018-09-18 NOTE — Telephone Encounter (Signed)
DS please advise. Thanks.  

## 2018-09-20 ENCOUNTER — Telehealth: Payer: Self-pay | Admitting: Pulmonary Disease

## 2018-09-20 ENCOUNTER — Other Ambulatory Visit: Payer: Self-pay | Admitting: Pulmonary Disease

## 2018-09-20 MED ORDER — PREDNISONE 10 MG (21) PO TBPK: ORAL_TABLET | ORAL | 0 refills | Status: DC

## 2018-09-20 NOTE — Telephone Encounter (Signed)
Left message for pt

## 2018-09-20 NOTE — Telephone Encounter (Signed)
Left message x2 for pt. 

## 2018-09-20 NOTE — Progress Notes (Signed)
The patient had called earlier for prednisone taper pack due to feeling that her asthma was flaring up.  She states that she received a voicemail after she had returned home around 5:30 PM.  I called the patient back as requested.  2 identifiers were utilized to verify correct patient.  She states that she has need for prednisone on occasion.  She went to have an MRI performed this morning at [facility] and noted increasing symptoms afterwards.  Did not sound distressed on the phone did not have a fever.  No purulent sputum production.  She did have some increasing wheezing.  Using her albuterol as needed.  Prednisone taper pack was called in for mild persistent exacerbation of moderate persistent asthma.  He has been advised to go to the emergency room if her symptoms worsen.  She is to call the office on Monday to give a report of progress.  Will notify Dr. Alva Garnet of the above.  Call was done at [place] 5 PM 5:55 PM on 09/20/2018   C. Derrill Kay, MD [place] PCCM

## 2018-09-23 ENCOUNTER — Telehealth: Payer: Self-pay | Admitting: Hematology and Oncology

## 2018-09-23 ENCOUNTER — Encounter: Payer: Self-pay | Admitting: Hematology and Oncology

## 2018-09-23 NOTE — Telephone Encounter (Signed)
Re:  Renal lesion  Spoke to patient about the left renal lesion noted on [facility] imaging slowly growing over time.  1.8 x 1.4 cm on 09/20/2018 1.5 x 1.0 cm on 10/19/2017 1.3 x 1.1 cm on 03/29/2017  Suspicision is for a renal cell carcinoma.  Her urologist at [facility] discussed cryoablation.  We discussed the procedure and her concerns.  I suggested follow-up with her urologist regarding the size limitations of the lesion for the procedure (she is wishing to postpone the procedure and have follow-up imaging).   Lequita Asal, MD

## 2018-09-23 NOTE — Telephone Encounter (Signed)
Pt spoke with LG after hours and was prescribed prednisone. Spoke to pt to verify that nothing further was needed. Pt stated that her breathing is improving with prednisone.  Pt also mentioned during our conversation that she has blisters around her trach. I have advised pt to contact ENT regarding this. She voiced her understanding.  Nothing further is needed.

## 2018-09-30 ENCOUNTER — Inpatient Hospital Stay: Payer: Medicare Other | Attending: Hematology and Oncology

## 2018-09-30 ENCOUNTER — Other Ambulatory Visit: Payer: Self-pay

## 2018-09-30 DIAGNOSIS — D509 Iron deficiency anemia, unspecified: Secondary | ICD-10-CM | POA: Diagnosis not present

## 2018-09-30 DIAGNOSIS — E538 Deficiency of other specified B group vitamins: Secondary | ICD-10-CM

## 2018-09-30 LAB — CBC WITH DIFFERENTIAL/PLATELET
Abs Immature Granulocytes: 0.01 10*3/uL (ref 0.00–0.07)
Basophils Absolute: 0.1 10*3/uL (ref 0.0–0.1)
Basophils Relative: 1 %
Eosinophils Absolute: 0.2 10*3/uL (ref 0.0–0.5)
Eosinophils Relative: 4 %
HCT: 39.6 % (ref 36.0–46.0)
Hemoglobin: 13.7 g/dL (ref 12.0–15.0)
Immature Granulocytes: 0 %
Lymphocytes Relative: 26 %
Lymphs Abs: 1.3 10*3/uL (ref 0.7–4.0)
MCH: 32.9 pg (ref 26.0–34.0)
MCHC: 34.6 g/dL (ref 30.0–36.0)
MCV: 95 fL (ref 80.0–100.0)
Monocytes Absolute: 0.5 10*3/uL (ref 0.1–1.0)
Monocytes Relative: 11 %
Neutro Abs: 2.9 10*3/uL (ref 1.7–7.7)
Neutrophils Relative %: 58 %
Platelets: 189 10*3/uL (ref 150–400)
RBC: 4.17 MIL/uL (ref 3.87–5.11)
RDW: 12 % (ref 11.5–15.5)
WBC: 5 10*3/uL (ref 4.0–10.5)
nRBC: 0 % (ref 0.0–0.2)

## 2018-09-30 LAB — FERRITIN: Ferritin: 163 ng/mL (ref 11–307)

## 2018-10-05 ENCOUNTER — Encounter: Payer: Self-pay | Admitting: Hematology and Oncology

## 2018-10-09 ENCOUNTER — Other Ambulatory Visit: Payer: Self-pay | Admitting: Family Medicine

## 2018-10-09 ENCOUNTER — Other Ambulatory Visit: Payer: Self-pay

## 2018-10-09 ENCOUNTER — Ambulatory Visit (INDEPENDENT_AMBULATORY_CARE_PROVIDER_SITE_OTHER): Payer: Medicare Other | Admitting: Family Medicine

## 2018-10-09 ENCOUNTER — Telehealth: Payer: Self-pay

## 2018-10-09 ENCOUNTER — Encounter: Payer: Self-pay | Admitting: Family Medicine

## 2018-10-09 DIAGNOSIS — R300 Dysuria: Secondary | ICD-10-CM

## 2018-10-09 MED ORDER — CEPHALEXIN 500 MG PO CAPS: 500.0000 mg | ORAL_CAPSULE | Freq: Two times a day (BID) | ORAL | 0 refills | Status: DC

## 2018-10-09 NOTE — Telephone Encounter (Signed)
Copied from [place] [id number]. Topic: General - Other >> Oct 09, 2018 11:18 AM Keene Breath wrote: Reason for CRM: Patient thinks she might have a UTI and would like the doctor or nurse to call her in a script.  Her appt. Is not until Friday and would like something before then.  CB# [phone]

## 2018-10-09 NOTE — Telephone Encounter (Signed)
Pt scheduled virtual visit w/ PCP today @ 3:15 pm.

## 2018-10-09 NOTE — Assessment & Plan Note (Signed)
Concerning for UTI.  We will empirically treat her with Keflex.  If not improving over the next several days would consider evaluation of urine.  She has follow-up on Friday and she will keep this and we will see how she is doing at that time.  She is given return precautions.

## 2018-10-09 NOTE — Progress Notes (Signed)
Virtual Visit via video Note  This visit type was conducted due to national recommendations for restrictions regarding the COVID-19 pandemic (e.g. social distancing).  This format is felt to be most appropriate for this patient at this time.  All issues noted in this document were discussed and addressed.  No physical exam was performed (except for noted visual exam findings with Video Visits).   I connected with Altamease Oiler today at  3:15 PM EDT by a video enabled telemedicine application and verified that I am speaking with the correct person using two identifiers. Location patient: home Location provider: work  Persons participating in the virtual visit: patient, provider  I discussed the limitations, risks, security and privacy concerns of performing an evaluation and management service by telephone and the availability of in person appointments. I also discussed with the patient that there may be a patient responsible charge related to this service. The patient expressed understanding and agreed to proceed.   Reason for visit: same day visit  HPI: Dysuria: Patient notes this started 1 to 2 days ago.  She has urinary urgency and frequency.  No fevers though she did have some chills last night.  She does note some tiredness with this.  She has low back discomfort and suprapubic discomfort.  No vaginal discharge.  No diarrhea or vomiting.  Minimal nausea.  No respiratory symptoms.   ROS: See pertinent positives and negatives per HPI.  Past Medical History:  Diagnosis Date  . Allergic rhinitis   . Allergy    multiple, mostly aspirin, levaquin and shellfish.  . Anemia   . Asthma   . Cataract 2014   exraction with len implant  . Complication of anesthesia    Breathing problems upon waking up. 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  . Compressed cervical disc   . COPD (chronic obstructive pulmonary disease) ([place])   . CVA (cerebral infarction)   . Dyspnea    . Esophageal dysmotility   . Heart murmur    as child  . History of hiatal hernia   . IBS (irritable bowel syndrome)   . Migraine   . PICC (peripherally inserted central catheter) removal 02/20/2017  . PONV (postoperative nausea and vomiting)   . Problems with swallowing    intermittently  . Pulmonary fibrosis ([place])   . Shingles   . Stroke [facility])    slurred speech, drawn face, imaging normal, occurred twice, [facility]-"TIA" if antything" 02/20/17-no residual effects  . Tracheostomy in place [facility])   . Vocal cord paresis     Past Surgical History:  Procedure Laterality Date  . ABDOMINAL HYSTERECTOMY    . APPLICATION OF A-CELL OF HEAD/NECK N/A 02/21/2017   Procedure: APPLICATION OF A-CELL OF HEAD/NECK;  Surgeon: Wallace Going, DO;  Location: [place];  Service: Plastics;  Laterality: N/A;  . BOTOX INJECTION N/A 07/29/2013   Procedure: BOTOX INJECTION;  Surgeon: Jerene Bears, MD;  [facility];  Service: Gastroenterology;  Laterality: N/A;  . BOTOX INJECTION N/A 05/04/2015   Procedure: BOTOX INJECTION;  Surgeon: Jerene Bears, MD;  [facility];  Service: Gastroenterology;  Laterality: N/A;  . BOTOX INJECTION N/A 02/18/2018   Procedure: BOTOX INJECTION;  Surgeon: Jerene Bears, MD;  [facility];  Service: Gastroenterology;  Laterality: N/A;  . BREAST BIOPSY Left    neg  . BREAST SURGERY Left 2002   bx of skin  . CHOLECYSTECTOMY    . COLONOSCOPY    .  ESOPHAGEAL MANOMETRY N/A 12/16/2012   Procedure: ESOPHAGEAL MANOMETRY (EM);  Surgeon: Jerene Bears, MD;  [facility];  Service: Gastroenterology;  Laterality: N/A;  . ESOPHAGOGASTRODUODENOSCOPY (EGD) WITH PROPOFOL N/A 07/29/2013   Procedure: ESOPHAGOGASTRODUODENOSCOPY (EGD) WITH PROPOFOL;  Surgeon: Jerene Bears, MD;  [facility];  Service: Gastroenterology;  Laterality: N/A;  with botox injection  . ESOPHAGOGASTRODUODENOSCOPY (EGD) WITH PROPOFOL N/A 05/04/2015   Procedure:  ESOPHAGOGASTRODUODENOSCOPY (EGD) WITH PROPOFOL;  Surgeon: Jerene Bears, MD;  [facility];  Service: Gastroenterology;  Laterality: N/A;  . ESOPHAGOGASTRODUODENOSCOPY (EGD) WITH PROPOFOL N/A 02/18/2018   Procedure: ESOPHAGOGASTRODUODENOSCOPY (EGD) WITH PROPOFOL;  Surgeon: Jerene Bears, MD;  [facility];  Service: Gastroenterology;  Laterality: N/A;  . EYE SURGERY     Catarct surgery 2014  . Eye Surgery AS Child Left   . INCISION AND DRAINAGE OF WOUND N/A 02/21/2017   Procedure: IRRIGATION AND DEBRIDEMENT WOUND NECK;  Surgeon: Wallace Going, DO;  Location: [place];  Service: Plastics;  Laterality: N/A;  . JEJUNOSTOMY FEEDING TUBE     x2 both failed. no longer has  . LUMBAR LAMINECTOMY/DECOMPRESSION MICRODISCECTOMY Bilateral 05/13/2018   Procedure: Laminectomy and Foraminotomy - Lumbar four-Lumbar five- bilateral;  Surgeon: Eustace Moore, MD;  Location: [place];  Service: Neurosurgery;  Laterality: Bilateral;  . MULTIPLE TOOTH EXTRACTIONS     2 teeth removed  . OOPHORECTOMY    . POSTERIOR CERVICAL FUSION/FORAMINOTOMY N/A 01/10/2017   Procedure: LAMINECTOMY AND FORAMINOTOMY CERVICAL FOUR-CERVICAL FIVE, CERVICAL FIVE-SIX POSTERIOR CERVICAL INSTRUMENT FUSION CERVICAL THREE-CERVICAL SEVEN,CERVICAL LAMINECTOMY CERVICAL THREE-CERVICAL SEVEN.;  Surgeon: Eustace Moore, MD;  Location: [place];  Service: Neurosurgery;  Laterality: N/A;  posterior  . TRACHEOSTOMY  1996   [facility] at [facility], Dr. Kathyrn Sheriff  . TUBAL LIGATION    . VIDEO BRONCHOSCOPY Bilateral 11/20/2012   Procedure: VIDEO BRONCHOSCOPY WITH FLUORO;  Surgeon: Juanito Doom, MD;  [facility];  Service: Cardiopulmonary;  Laterality: Bilateral;    Family History  Problem Relation Age of Onset  . Asthma Cousin   . COPD Cousin   . Breast cancer Maternal Grandmother 60  . Asthma Father   . Kidney cancer Father   . Arrhythmia Father   . Lung cancer Father   . Lung cancer Paternal Uncle   . COPD Paternal  Grandfather   . Breast cancer Maternal Aunt 40  . Bladder Cancer Neg Hx   . Colon cancer Neg Hx   . Esophageal cancer Neg Hx   . Pancreatic cancer Neg Hx   . Stomach cancer Neg Hx   . Liver disease Neg Hx     SOCIAL HX: Non-smoker   Current Outpatient Medications:  .  acetaminophen (TYLENOL) 500 MG tablet, Take 500-1,000 mg by mouth 2 (two) times daily as needed for moderate pain or headache. , Disp: , Rfl:  .  albuterol (PROVENTIL HFA;VENTOLIN HFA) 108 (90 Base) MCG/ACT inhaler, Inhale 2 puffs into the lungs every 6 (six) hours as needed for wheezing or shortness of breath., Disp: 1 Inhaler, Rfl: 5 .  cetirizine (ZYRTEC) 10 MG tablet, TAKE ONE TABLET EVERY DAY, Disp: 30 tablet, Rfl: 5 .  clopidogrel (PLAVIX) 75 MG tablet, Take 1 tablet (75 mg total) by mouth daily., Disp: 90 tablet, Rfl: 3 .  diphenhydrAMINE (BENADRYL) 25 MG tablet, Take 25 mg by mouth daily as needed for allergies., Disp: , Rfl:  .  EPINEPHrine (EPIPEN 2-PAK) 0.3 mg/0.3 mL IJ SOAJ injection, Inject 0.3 mLs (0.3 mg total) into  the muscle once., Disp: 1 Device, Rfl: 0 .  estradiol (ESTRACE) 1 MG tablet, TAKE ONE TABLET EVERY DAY, Disp: 90 tablet, Rfl: 1 .  fluticasone (FLONASE) 50 MCG/ACT nasal spray, PLACE 2 SPRAYS INTO BOTH NOSTRILS DAILY. (Patient taking differently: Place 2 sprays into both nostrils daily as needed for allergies. ), Disp: 16 g, Rfl: 2 .  furosemide (LASIX) 20 MG tablet, TAKE ONE TABLET EVERY DAY AS NEEDED, Disp: 90 tablet, Rfl: 1 .  guaiFENesin-codeine 100-10 MG/5ML syrup, Take 5 mLs by mouth 3 (three) times daily as needed for cough., Disp: 120 mL, Rfl: 0 .  ibuprofen (ADVIL,MOTRIN) 200 MG tablet, Take 400-800 mg by mouth daily as needed for headache or moderate pain., Disp: , Rfl:  .  ipratropium-albuterol (DUONEB) 0.5-2.5 (3) MG/3ML SOLN, Take 3 mLs by nebulization every 4 (four) hours as needed. Dx 496 (Patient taking differently: Take 3 mLs by nebulization every 4 (four) hours as needed (wheezing or  shortness of breath). Dx 496), Disp: 120 mL, Rfl: 2 .  metoprolol succinate (TOPROL XL) 25 MG 24 hr tablet, Take 0.5 tablets (12.5 mg total) by mouth daily., Disp: 90 tablet, Rfl: 3 .  montelukast (SINGULAIR) 10 MG tablet, TAKE ONE TABLET AT BEDTIME, Disp: 90 tablet, Rfl: 1 .  mupirocin ointment (BACTROBAN) 2 %, Apply 1 application daily as needed topically (FOR [facility] SITE IRRITATION). , Disp: , Rfl:  .  Naphazoline-Pheniramine (OPCON-A) 0.027-0.315 % SOLN, Place 1 drop into both eyes daily as needed (for dry eyes). , Disp: , Rfl:  .  pantoprazole (PROTONIX) 40 MG tablet, Take 40 mg by mouth 2 (two) times daily., Disp: , Rfl:  .  potassium chloride SA (K-DUR) 20 MEQ tablet, Take 1 tablet (20 mEq total) by mouth daily as needed (cramps)., Disp: 30 tablet, Rfl: 1 .  predniSONE (STERAPRED UNI-PAK 21 TAB) 10 MG (21) TBPK tablet, Take as directed in dose pack, Disp: 1 each, Rfl: 0 .  rifaximin (XIFAXAN) 550 MG TABS tablet, Take 1 tablet (550 mg total) by mouth 3 (three) times daily., Disp: 42 tablet, Rfl: 0 .  sodium chloride (OCEAN) 0.65 % SOLN nasal spray, Place 1 spray into both nostrils every 4 (four) hours as needed for congestion. , Disp: , Rfl:  .  sucralfate (CARAFATE) 1 g tablet, TAKE 1 TABLET BY MOUTH 4 TIMES DAILY WITH MEALS AND AT BEDTIME, Disp: 120 tablet, Rfl: 1 .  White Petrolatum-Mineral Oil (LUBRICANT EYE) OINT, Place 1 application into both eyes 2 (two) times daily as needed (for dry eyes). , Disp: , Rfl:  .  cephALEXin (KEFLEX) 500 MG capsule, Take 1 capsule (500 mg total) by mouth 2 (two) times daily., Disp: 14 capsule, Rfl: 0  EXAM:  VITALS per patient if applicable: None  GENERAL: alert, oriented, appears well and in no acute distress  HEENT: atraumatic, conjunttiva clear, no obvious abnormalities on inspection of external nose and ears  NECK: normal movements of the head and neck  LUNGS: on inspection no signs of respiratory distress, breathing rate appears normal, no  obvious gross SOB, gasping or wheezing  CV: no obvious cyanosis  MS: moves all visible extremities without noticeable abnormality  PSYCH/NEURO: pleasant and cooperative, no obvious depression or anxiety, speech and thought processing grossly intact  ASSESSMENT AND PLAN:  Discussed the following assessment and plan:  Dysuria Concerning for UTI.  We will empirically treat her with Keflex.  If not improving over the next several days would consider evaluation of urine.  She has  follow-up on Friday and she will keep this and we will see how she is doing at that time.  She is given return precautions.    I discussed the assessment and treatment plan with the patient. The patient was provided an opportunity to ask questions and all were answered. The patient agreed with the plan and demonstrated an understanding of the instructions.   The patient was advised to call back or seek an in-person evaluation if the symptoms worsen or if the condition fails to improve as anticipated.   Tommi Rumps, MD

## 2018-10-09 NOTE — Telephone Encounter (Signed)
She can be scheduled at 3:15 in the same day slot. Thanks.

## 2018-10-09 NOTE — Telephone Encounter (Signed)
Called and spoke to pt.  Pt c/o of lower back and abdominal pain, frequent urination, burning sensation, chills, and nausea.  No fever. Pt said that this is usually how her UTI's start out and wants to try to prevent it from getting worse.  Pt said that she has an appt scheduled w/ Dr. [name] on Friday but doesn't want to wait that long.  Pt wants to know if PCP can call in Rx or wants to know if she should scheduled a phone visit for today?    Please advise.

## 2018-10-11 ENCOUNTER — Ambulatory Visit (INDEPENDENT_AMBULATORY_CARE_PROVIDER_SITE_OTHER): Payer: Medicare Other | Admitting: Family Medicine

## 2018-10-11 ENCOUNTER — Ambulatory Visit (INDEPENDENT_AMBULATORY_CARE_PROVIDER_SITE_OTHER): Payer: Medicare Other

## 2018-10-11 ENCOUNTER — Encounter: Payer: Self-pay | Admitting: Family Medicine

## 2018-10-11 ENCOUNTER — Other Ambulatory Visit: Payer: Self-pay

## 2018-10-11 DIAGNOSIS — K769 Liver disease, unspecified: Secondary | ICD-10-CM

## 2018-10-11 DIAGNOSIS — E785 Hyperlipidemia, unspecified: Secondary | ICD-10-CM | POA: Diagnosis not present

## 2018-10-11 DIAGNOSIS — E538 Deficiency of other specified B group vitamins: Secondary | ICD-10-CM

## 2018-10-11 DIAGNOSIS — R300 Dysuria: Secondary | ICD-10-CM

## 2018-10-11 DIAGNOSIS — Z79890 Hormone replacement therapy (postmenopausal): Secondary | ICD-10-CM

## 2018-10-11 DIAGNOSIS — Z0000 Encounter for general adult medical examination without abnormal findings: Secondary | ICD-10-CM

## 2018-10-11 DIAGNOSIS — E876 Hypokalemia: Secondary | ICD-10-CM

## 2018-10-11 DIAGNOSIS — N289 Disorder of kidney and ureter, unspecified: Secondary | ICD-10-CM

## 2018-10-11 NOTE — Progress Notes (Signed)
Subjective:   Anne [name] is a 68 y.o. female who presents for Medicare Annual (Subsequent) preventive examination.  Review of Systems:  No ROS.  Medicare Wellness Virtual Visit.  Visual/audio telehealth visit, UTA vital signs.   See social history for additional risk factors.   Cardiac Risk Factors include: advanced age (>67men, >6 women)     Objective:     Vitals: There were no vitals taken for this visit.  There is no height or weight on file to calculate BMI.  Advanced Directives 10/11/2018 07/01/2018 05/07/2018 02/18/2018 12/11/2017 10/09/2017 04/23/2017  Does Patient Have a Medical Advance Directive? Yes Yes Yes Yes Yes Yes Yes  Type of Paramedic of [place];Living will [place];Living will Living will [place];Living will [place];Living will Living will [place];Living will  Does patient want to make changes to medical advance directive? No - Patient declined - - - - No - Patient declined No - Patient declined  Copy of [place] in Chart? No - copy requested No - copy requested - No - copy requested - - -  Would patient like information on creating a medical advance directive? - - - - - - -    Tobacco Social History   Tobacco Use  Smoking Status Never Smoker  Smokeless Tobacco Never Used     Counseling given: Not Answered   Clinical Intake:  Pre-visit preparation completed: Yes        Diabetes: No  How often do you need to have someone help you when you read instructions, pamphlets, or other written materials from your doctor or pharmacy?: 1 - Never  Interpreter Needed?: No     Past Medical History:  Diagnosis Date  . Allergic rhinitis   . Allergy    multiple, mostly aspirin, levaquin and shellfish.  . Anemia   . Asthma   . Cataract 2014   exraction with len implant  . Complication of anesthesia    Breathing problems upon  waking up. 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  . Compressed cervical disc   . COPD (chronic obstructive pulmonary disease) ([place])   . CVA (cerebral infarction)   . Dyspnea   . Esophageal dysmotility   . Heart murmur    as child  . History of hiatal hernia   . IBS (irritable bowel syndrome)   . Migraine   . PICC (peripherally inserted central catheter) removal 02/20/2017  . PONV (postoperative nausea and vomiting)   . Problems with swallowing    intermittently  . Pulmonary fibrosis ([place])   . Shingles   . Stroke [facility])    slurred speech, drawn face, imaging normal, occurred twice, [facility]-"TIA" if antything" 02/20/17-no residual effects  . Tracheostomy in place [facility])   . Vocal cord paresis    Past Surgical History:  Procedure Laterality Date  . ABDOMINAL HYSTERECTOMY    . APPLICATION OF A-CELL OF HEAD/NECK N/A 02/21/2017   Procedure: APPLICATION OF A-CELL OF HEAD/NECK;  Surgeon: Wallace Going, DO;  Location: [place];  Service: Plastics;  Laterality: N/A;  . BOTOX INJECTION N/A 07/29/2013   Procedure: BOTOX INJECTION;  Surgeon: Jerene Bears, MD;  Location: [facility];  Service: Gastroenterology;  Laterality: N/A;  . BOTOX INJECTION N/A 05/04/2015   Procedure: BOTOX INJECTION;  Surgeon: Jerene Bears, MD;  [facility]: [facility];  Service: Gastroenterology;  Laterality: N/A;  . BOTOX INJECTION N/A 02/18/2018  Procedure: BOTOX INJECTION;  Surgeon: Jerene Bears, MD;  Location: [facility];  Service: Gastroenterology;  Laterality: N/A;  . BREAST BIOPSY Left    neg  . BREAST SURGERY Left 2002   bx of skin  . CHOLECYSTECTOMY    . COLONOSCOPY    . ESOPHAGEAL MANOMETRY N/A 12/16/2012   Procedure: ESOPHAGEAL MANOMETRY (EM);  Surgeon: Jerene Bears, MD;  [facility];  Service: Gastroenterology;  Laterality: N/A;  . ESOPHAGOGASTRODUODENOSCOPY (EGD) WITH PROPOFOL N/A 07/29/2013   Procedure: ESOPHAGOGASTRODUODENOSCOPY (EGD) WITH PROPOFOL;  Surgeon: Jerene Bears, MD;  [facility];  Service: Gastroenterology;  Laterality: N/A;  with botox injection  . ESOPHAGOGASTRODUODENOSCOPY (EGD) WITH PROPOFOL N/A 05/04/2015   Procedure: ESOPHAGOGASTRODUODENOSCOPY (EGD) WITH PROPOFOL;  Surgeon: Jerene Bears, MD;  [facility];  Service: Gastroenterology;  Laterality: N/A;  . ESOPHAGOGASTRODUODENOSCOPY (EGD) WITH PROPOFOL N/A 02/18/2018   Procedure: ESOPHAGOGASTRODUODENOSCOPY (EGD) WITH PROPOFOL;  Surgeon: Jerene Bears, MD;  [facility];  Service: Gastroenterology;  Laterality: N/A;  . EYE SURGERY     Catarct surgery 2014  . Eye Surgery AS Child Left   . INCISION AND DRAINAGE OF WOUND N/A 02/21/2017   Procedure: IRRIGATION AND DEBRIDEMENT WOUND NECK;  Surgeon: Wallace Going, DO;  Location: [place];  Service: Plastics;  Laterality: N/A;  . JEJUNOSTOMY FEEDING TUBE     x2 both failed. no longer has  . LUMBAR LAMINECTOMY/DECOMPRESSION MICRODISCECTOMY Bilateral 05/13/2018   Procedure: Laminectomy and Foraminotomy - Lumbar four-Lumbar five- bilateral;  Surgeon: Eustace Moore, MD;  Location: [place];  Service: Neurosurgery;  Laterality: Bilateral;  . MULTIPLE TOOTH EXTRACTIONS     2 teeth removed  . OOPHORECTOMY    . POSTERIOR CERVICAL FUSION/FORAMINOTOMY N/A 01/10/2017   Procedure: LAMINECTOMY AND FORAMINOTOMY CERVICAL FOUR-CERVICAL FIVE, CERVICAL FIVE-SIX POSTERIOR CERVICAL INSTRUMENT FUSION CERVICAL THREE-CERVICAL SEVEN,CERVICAL LAMINECTOMY CERVICAL THREE-CERVICAL SEVEN.;  Surgeon: Eustace Moore, MD;  Location: [place];  Service: Neurosurgery;  Laterality: N/A;  posterior  . TRACHEOSTOMY  1996   [facility], Dr. Kathyrn Sheriff  . TUBAL LIGATION    . VIDEO BRONCHOSCOPY Bilateral 11/20/2012   Procedure: VIDEO BRONCHOSCOPY WITH FLUORO;  Surgeon: Juanito Doom, MD;  [facility];  Service: Cardiopulmonary;  Laterality: Bilateral;   Family History  Problem Relation Age of Onset  . Asthma Cousin   . COPD Cousin   .  Breast cancer Maternal Grandmother 60  . Asthma Father   . Kidney cancer Father   . Arrhythmia Father   . Lung cancer Father   . Lung cancer Paternal Uncle   . COPD Paternal Grandfather   . Alcohol abuse Mother   . Breast cancer Maternal Aunt 69  . Bladder Cancer Neg Hx   . Colon cancer Neg Hx   . Esophageal cancer Neg Hx   . Pancreatic cancer Neg Hx   . Stomach cancer Neg Hx   . Liver disease Neg Hx    Social History   Socioeconomic History  . Marital status: Married    Spouse name: Not on file  . Number of children: 4  . Years of education: Not on file  . Highest education level: Not on file  Occupational History  . Not on file  Social Needs  . Financial resource strain: Not hard at all  . Food insecurity    Worry: Never true    Inability: Never true  . Transportation needs    Medical: No    Non-medical: No  Tobacco  Use  . Smoking status: Never Smoker  . Smokeless tobacco: Never Used  Substance and Sexual Activity  . Alcohol use: Yes    Alcohol/week: 0.0 standard drinks    Comment: occ.  . Drug use: No  . Sexual activity: Not on file  Lifestyle  . Physical activity    Days per week: 7 days    Minutes per session: 60 min  . Stress: Not on file  Relationships  . Social Herbalist on phone: Not on file    Gets together: Not on file    Attends religious service: Not on file    Active member of club or organization: Not on file    Attends meetings of clubs or organizations: Not on file    Relationship status: Not on file  Other Topics Concern  . Not on file  Social History Narrative   Lives in [place] with husband.  She has four children.   Retired from [employer] Medications as of 10/11/2018  Medication Sig  . acetaminophen (TYLENOL) 500 MG tablet Take 500-1,000 mg by mouth 2 (two) times daily as needed for moderate pain or headache.   . albuterol (PROVENTIL HFA;VENTOLIN HFA) 108 (90 Base) MCG/ACT inhaler  Inhale 2 puffs into the lungs every 6 (six) hours as needed for wheezing or shortness of breath.  . cephALEXin (KEFLEX) 500 MG capsule Take 1 capsule (500 mg total) by mouth 2 (two) times daily.  . cetirizine (ZYRTEC) 10 MG tablet TAKE ONE TABLET EVERY DAY  . clopidogrel (PLAVIX) 75 MG tablet Take 1 tablet (75 mg total) by mouth daily.  . diphenhydrAMINE (BENADRYL) 25 MG tablet Take 25 mg by mouth daily as needed for allergies.  [name] EPINEPHrine (EPIPEN 2-PAK) 0.3 mg/0.3 mL IJ SOAJ injection Inject 0.3 mLs (0.3 mg total) into the muscle once.  [name] estradiol (ESTRACE) 1 MG tablet TAKE ONE TABLET EVERY DAY  . fluticasone (FLONASE) 50 MCG/ACT nasal spray PLACE 2 SPRAYS INTO BOTH NOSTRILS DAILY. (Patient taking differently: Place 2 sprays into both nostrils daily as needed for allergies. )  . furosemide (LASIX) 20 MG tablet TAKE ONE TABLET EVERY DAY AS NEEDED  . guaiFENesin-codeine 100-10 MG/5ML syrup Take 5 mLs by mouth 3 (three) times daily as needed for cough.  [name] ibuprofen (ADVIL,MOTRIN) 200 MG tablet Take 400-800 mg by mouth daily as needed for headache or moderate pain.  [name] ipratropium-albuterol (DUONEB) 0.5-2.5 (3) MG/3ML SOLN Take 3 mLs by nebulization every 4 (four) hours as needed. Dx 496 (Patient taking differently: Take 3 mLs by nebulization every 4 (four) hours as needed (wheezing or shortness of breath). Dx 496)  . metoprolol succinate (TOPROL XL) 25 MG 24 hr tablet Take 0.5 tablets (12.5 mg total) by mouth daily.  . montelukast (SINGULAIR) 10 MG tablet TAKE ONE TABLET BY MOUTH DAILY BEDTIME  . mupirocin ointment (BACTROBAN) 2 % Apply 1 application daily as needed topically (FOR [facility] SITE IRRITATION).   . Naphazoline-Pheniramine (OPCON-A) 0.027-0.315 % SOLN Place 1 drop into both eyes daily as needed (for dry eyes).   . pantoprazole (PROTONIX) 40 MG tablet Take 40 mg by mouth 2 (two) times daily.  . potassium chloride SA (K-DUR) 20 MEQ tablet Take 1 tablet (20 mEq total) by mouth daily as needed  (cramps).  . predniSONE (STERAPRED UNI-PAK 21 TAB) 10 MG (21) TBPK tablet Take as directed in dose pack  . rifaximin (XIFAXAN) 550  MG TABS tablet Take 1 tablet (550 mg total) by mouth 3 (three) times daily.  . sodium chloride (OCEAN) 0.65 % SOLN nasal spray Place 1 spray into both nostrils every 4 (four) hours as needed for congestion.   . sucralfate (CARAFATE) 1 g tablet TAKE 1 TABLET BY MOUTH 4 TIMES DAILY WITH MEALS AND AT BEDTIME  . White Petrolatum-Mineral Oil (LUBRICANT EYE) OINT Place 1 application into both eyes 2 (two) times daily as needed (for dry eyes).    No facility-administered encounter medications on file as of 10/11/2018.     Activities of Daily Living In your present state of health, do you have any difficulty performing the following activities: 10/11/2018 05/07/2018  Hearing? Y N  Comment Hearing aids -  Vision? N N  Difficulty concentrating or making decisions? N N  Walking or climbing stairs? N Y  Comment - breathing related  Dressing or bathing? N N  Doing errands, shopping? N N  Preparing Food and eating ? N -  Using the Toilet? N -  In the past six months, have you accidently leaked urine? N -  Do you have problems with loss of bowel control? N -  Managing your Medications? N -  Managing your Finances? N -  Housekeeping or managing your Housekeeping? N -  Some recent data might be hidden    Patient Care Team: [name], MD as PCP - General (Family Medicine) Nickie Retort, MD as Consulting Physician (Urology) Pyrtle, Lajuan Lines, MD as Consulting Physician (Gastroenterology)    Assessment:   This is a routine wellness examination for [place].  I connected with patient 10/11/18 at  8:30 AM EDT by a video/audio enabled telemedicine application and verified that I am speaking with the correct person using two identifiers. Patient stated full name and DOB. Patient gave permission to continue with virtual visit. Patient's location was at home and  Nurse's location was at [place] office.   Health Screenings  Mammogram - 04/2018 Colonoscopy - 12/2018 Bone Density - 10/2013 Glaucoma -none Hearing - hearing aids Labs followed by pcp Dental- visits every 6 months Vision- visits within the last 12 months.  Social  Alcohol intake - yes      Smoking history- never    Smokers in home? none Illicit drug use? none Exercise - walking, 10-20 lb weight lifting, stretches, aerobics daily 45 min Diet - healthy BMI- discussed the importance of a healthy diet, water intake and the benefits of aerobic exercise.  Educational material provided.   Safety  Patient feels safe at home- yes Patient does have smoke detectors at home- yes Patient does wear sunscreen or protective clothing when in direct sunlight -yes Patient does wear seat belt when in a moving vehicle -yes Patient drives- yes Adequate lighting in the home- yes Hallway free from throw rugs, extension cords- yes Handrails in use when available- yes  Covid-19 precautions and sickness symptoms discussed.   Activities of Daily Living Patient denies needing assistance with: driving, household chores, feeding themselves, getting from bed to chair, getting to the toilet, bathing/showering, dressing, managing money, or preparing meals.  No new identified risk were noted.    Depression Screen Patient denies losing interest in daily life, feeling hopeless, or crying easily over simple problems.   Medication-taking as directed and without issues.   Fall Screen Patient denies being afraid of falling or falling in the last year.   Memory Screen Patient is alert.  Patient denies difficulty focusing, concentrating or  misplacing items. Correctly identified the president of the [place], season and recall. Patient likes to read a little, draw, paint, crotchet and play word games for brain stimulation.  Immunizations The following Immunizations were discussed: Influenza, shingles, pneumonia,  and tetanus.   Other Providers Patient Care Team: [name], MD as PCP - General (Family Medicine) Nickie Retort, MD as Consulting Physician (Urology) Pyrtle, Lajuan Lines, MD as Consulting Physician (Gastroenterology)  Exercise Activities and Dietary recommendations Current Exercise Habits: Home exercise routine, Type of exercise: walking;stretching;strength training/weights, Time (Minutes): 40, Frequency (Times/Week): 7, Weekly Exercise (Minutes/Week): 280, Intensity: Mild  Goals      Patient Stated   . Follow up with Primary Care Provider (pt-stated)     Keep all routine maintenance appointments as scheduled Eat Healthy        Fall Risk Fall Risk  10/11/2018 10/09/2017 04/23/2017 12/13/2016 10/28/2015  Falls in the past year? 0 No No No No  Number falls in past yr: - - - - -  Injury with Fall? - - - - -   Depression Screen PHQ 2/9 Scores 10/11/2018 10/09/2017 08/01/2017 06/06/2017  PHQ - 2 Score 0 0 0 0  PHQ- 9 Score - - 3 2     Cognitive Function MMSE - Mini Mental State Exam 10/09/2017  Orientation to time 5  Orientation to Place 5  Registration 3  Attention/ Calculation 5  Recall 3  Language- name 2 objects 2  Language- repeat 1  Language- follow 3 step command 3  Language- read & follow direction 1  Write a sentence 1  Copy design 1  Total score 30     6CIT Screen 10/11/2018  What Year? 0 points  What month? 0 points  What time? 0 points  Count back from 20 0 points  Months in reverse 0 points  Repeat phrase 0 points  Total Score 0    Immunization History  Administered Date(s) Administered  . Influenza, Quadrivalent, Recombinant, Inj, Pf 02/06/2017, 12/19/2017  . Influenza,inj,Quad PF,6+ Mos 12/19/2012  . Influenza,trivalent, recombinat, inj, PF 01/05/2014  . Pneumococcal Conjugate-13 01/23/2014   Screening Tests Health Maintenance  Topic Date Due  . TETANUS/TDAP  09/06/1968  . PNA vac Low Risk Adult (2 of 2 - PPSV23) 01/24/2015  . INFLUENZA  VACCINE  10/26/2018  . MAMMOGRAM  05/09/2020  . COLONOSCOPY  10/03/2027  . DEXA SCAN  Completed  . Hepatitis C Screening  Completed      Plan:    End of life planning; Advance aging; Advanced directives discussed.  Copy of current HCPOA/Living Will requested.    I have personally reviewed and noted the following in the patient's chart:   . Medical and social history . Use of alcohol, tobacco or illicit drugs  . Current medications and supplements . Functional ability and status . Nutritional status . Physical activity . Advanced directives . List of other physicians . Hospitalizations, surgeries, and ER visits in previous 12 months . Vitals . Screenings to include cognitive, depression, and falls . Referrals and appointments  In addition, I have reviewed and discussed with patient certain preventive protocols, quality metrics, and best practice recommendations. A written personalized care plan for preventive services as well as general preventive health recommendations were provided to patient.     Varney Biles, LPN  [id number]

## 2018-10-11 NOTE — Assessment & Plan Note (Signed)
Check potassium

## 2018-10-11 NOTE — Assessment & Plan Note (Signed)
She will come in for B12 injection in 2 weeks.  She will be taught how to give herself injections and if she is comfortable doing this at home we can send in a prescription for this.

## 2018-10-11 NOTE — Assessment & Plan Note (Signed)
She will continue to see oncology.  She will keep her appointment with them in December.

## 2018-10-11 NOTE — Assessment & Plan Note (Signed)
Reinforced the potential long-term risks of this medication.  She does benefit quite significantly from taking this and she opted to continue taking this as the benefit outweighs the risk for her.  We will continue to discuss this with her in the future.

## 2018-10-11 NOTE — Assessment & Plan Note (Signed)
Incidentally noted on recent MRI.  She will keep her 48-month follow-up to have MRI repeated.

## 2018-10-11 NOTE — Assessment & Plan Note (Signed)
Symptoms have significantly improved with Keflex.  She will finish her course of Keflex.

## 2018-10-11 NOTE — Patient Instructions (Addendum)
  Anne [name] , Thank you for taking time to come for your Medicare Wellness Visit. I appreciate your ongoing commitment to your health goals. Please review the following plan we discussed and let me know if I can assist you in the future.   These are the goals we discussed: Goals      Patient Stated   . Follow up with Primary Care Provider (pt-stated)     Keep all routine maintenance appointments as scheduled Eat Healthy        This is a list of the screening recommended for you and due dates:  Health Maintenance  Topic Date Due  . Tetanus Vaccine  09/06/1968  . Pneumonia vaccines (2 of 2 - PPSV23) 01/24/2015  . Flu Shot  10/26/2018  . Mammogram  05/09/2020  . Colon Cancer Screening  10/03/2027  . DEXA scan (bone density measurement)  Completed  .  Hepatitis C: One time screening is recommended by Center for Disease Control  (CDC) for  adults born from [birth year] through 1966.   Completed

## 2018-10-11 NOTE — Assessment & Plan Note (Signed)
Check lipid panel  

## 2018-10-11 NOTE — Progress Notes (Signed)
Virtual Visit via video Note  This visit type was conducted due to national recommendations for restrictions regarding the COVID-19 pandemic (e.g. social distancing).  This format is felt to be most appropriate for this patient at this time.  All issues noted in this document were discussed and addressed.  No physical exam was performed (except for noted visual exam findings with Video Visits).   I connected with Anne [name] today at  9:30 AM EDT by a video enabled telemedicine application and verified that I am speaking with the correct person using two identifiers. Location patient: home Location provider: work  Persons participating in the virtual visit: patient, provider  I discussed the limitations, risks, security and privacy concerns of performing an evaluation and management service by telephone and the availability of in person appointments. I also discussed with the patient that there may be a patient responsible charge related to this service. The patient expressed understanding and agreed to proceed.   Reason for visit: follow-up  HPI: UTI: Patient notes she feels quite a bit better.  The dysuria, frequency, and urgency have improved.  She has had no chills and started on the antibiotics.  Renal lesion: She notes this has grown progressively.  She has been following with oncology at [facility] and they have discussed cryoablation.  They are going to readdress this in December.  They also found a small indeterminate liver lesion though the features were not worrisome per radiology.  B12 deficiency: Patient is due for a B12 injection and she will be rescheduled for this to be done in the office in about 2 weeks.  She would be okay being taught how to do these at home.  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  She has tried to come off of this several times previously though develops nervousness, sleep issues, and hot flashes when she tapers.  She has  consistently been getting mammograms.  She is aware of the risk of stroke, heart attack, blood clot, and breast cancer with this.  She is status post hysterectomy.  She does have a possible history of TIA and notes she was on oral Premarin at that time and they did not take her off of this.  She has had no recurrent TIA or stroke issues.   ROS: See pertinent positives and negatives per HPI.  Past Medical History:  Diagnosis Date  . Allergic rhinitis   . Allergy    multiple, mostly aspirin, levaquin and shellfish.  . Anemia   . Asthma   . Cataract 2014   exraction with len implant  . Complication of anesthesia    Breathing problems upon waking up. 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  . Compressed cervical disc   . COPD (chronic obstructive pulmonary disease) ([place])   . CVA (cerebral infarction)   . Dyspnea   . Esophageal dysmotility   . Heart murmur    as child  . History of hiatal hernia   . IBS (irritable bowel syndrome)   . Migraine   . PICC (peripherally inserted central catheter) removal 02/20/2017  . PONV (postoperative nausea and vomiting)   . Problems with swallowing    intermittently  . Pulmonary fibrosis ([place])   . Shingles   . Stroke [facility])    slurred speech, drawn face, imaging normal, occurred twice, [facility]-"TIA" if antything" 02/20/17-no residual effects  . Tracheostomy in place [facility])   . Vocal cord paresis     Past Surgical  History:  Procedure Laterality Date  . ABDOMINAL HYSTERECTOMY    . APPLICATION OF A-CELL OF HEAD/NECK N/A 02/21/2017   Procedure: APPLICATION OF A-CELL OF HEAD/NECK;  Surgeon: Wallace Going, DO;  Location: [place];  Service: Plastics;  Laterality: N/A;  . BOTOX INJECTION N/A 07/29/2013   Procedure: BOTOX INJECTION;  Surgeon: Jerene Bears, MD;  Location[facility];  Service: Gastroenterology;  Laterality: N/A;  . BOTOX INJECTION N/A 05/04/2015   Procedure: BOTOX INJECTION;  Surgeon: Jerene Bears, MD;   Location[facility];  Service: Gastroenterology;  Laterality: N/A;  . BOTOX INJECTION N/A 02/18/2018   Procedure: BOTOX INJECTION;  Surgeon: Jerene Bears, MD;  [facility];  Service: Gastroenterology;  Laterality: N/A;  . BREAST BIOPSY Left    neg  . BREAST SURGERY Left 2002   bx of skin  . CHOLECYSTECTOMY    . COLONOSCOPY    . ESOPHAGEAL MANOMETRY N/A 12/16/2012   Procedure: ESOPHAGEAL MANOMETRY (EM);  Surgeon: Jerene Bears, MD;  [facility];  Service: Gastroenterology;  Laterality: N/A;  . ESOPHAGOGASTRODUODENOSCOPY (EGD) WITH PROPOFOL N/A 07/29/2013   Procedure: ESOPHAGOGASTRODUODENOSCOPY (EGD) WITH PROPOFOL;  Surgeon: Jerene Bears, MD;  [facility];  Service: Gastroenterology;  Laterality: N/A;  with botox injection  . ESOPHAGOGASTRODUODENOSCOPY (EGD) WITH PROPOFOL N/A 05/04/2015   Procedure: ESOPHAGOGASTRODUODENOSCOPY (EGD) WITH PROPOFOL;  Surgeon: Jerene Bears, MD;  [facility];  Service: Gastroenterology;  Laterality: N/A;  . ESOPHAGOGASTRODUODENOSCOPY (EGD) WITH PROPOFOL N/A 02/18/2018   Procedure: ESOPHAGOGASTRODUODENOSCOPY (EGD) WITH PROPOFOL;  Surgeon: Jerene Bears, MD;  [facility];  Service: Gastroenterology;  Laterality: N/A;  . EYE SURGERY     Catarct surgery 2014  . Eye Surgery AS Child Left   . INCISION AND DRAINAGE OF WOUND N/A 02/21/2017   Procedure: IRRIGATION AND DEBRIDEMENT WOUND NECK;  Surgeon: Wallace Going, DO;  Location: [place];  Service: Plastics;  Laterality: N/A;  . JEJUNOSTOMY FEEDING TUBE     x2 both failed. no longer has  . LUMBAR LAMINECTOMY/DECOMPRESSION MICRODISCECTOMY Bilateral 05/13/2018   Procedure: Laminectomy and Foraminotomy - Lumbar four-Lumbar five- bilateral;  Surgeon: Eustace Moore, MD;  Location: [place];  Service: Neurosurgery;  Laterality: Bilateral;  . MULTIPLE TOOTH EXTRACTIONS     2 teeth removed  . OOPHORECTOMY    . POSTERIOR CERVICAL FUSION/FORAMINOTOMY N/A 01/10/2017    Procedure: LAMINECTOMY AND FORAMINOTOMY CERVICAL FOUR-CERVICAL FIVE, CERVICAL FIVE-SIX POSTERIOR CERVICAL INSTRUMENT FUSION CERVICAL THREE-CERVICAL SEVEN,CERVICAL LAMINECTOMY CERVICAL THREE-CERVICAL SEVEN.;  Surgeon: Eustace Moore, MD;  Location: [place];  Service: Neurosurgery;  Laterality: N/A;  posterior  . TRACHEOSTOMY  1996   done at [facility], Dr. Kathyrn Sheriff  . TUBAL LIGATION    . VIDEO BRONCHOSCOPY Bilateral 11/20/2012   Procedure: VIDEO BRONCHOSCOPY WITH FLUORO;  Surgeon: Juanito Doom, MD;  [facility];  Service: Cardiopulmonary;  Laterality: Bilateral;    Family History  Problem Relation Age of Onset  . Asthma Cousin   . COPD Cousin   . Breast cancer Maternal Grandmother 60  . Asthma Father   . Kidney cancer Father   . Arrhythmia Father   . Lung cancer Father   . Lung cancer Paternal Uncle   . COPD Paternal Grandfather   . Alcohol abuse Mother   . Breast cancer Maternal Aunt 24  . Bladder Cancer Neg Hx   . Colon cancer Neg Hx   . Esophageal cancer Neg Hx   . Pancreatic cancer Neg Hx   .  Stomach cancer Neg Hx   . Liver disease Neg Hx     SOCIAL HX: Non-smoker.   Current Outpatient Medications:  .  acetaminophen (TYLENOL) 500 MG tablet, Take 500-1,000 mg by mouth 2 (two) times daily as needed for moderate pain or headache. , Disp: , Rfl:  .  albuterol (PROVENTIL HFA;VENTOLIN HFA) 108 (90 Base) MCG/ACT inhaler, Inhale 2 puffs into the lungs every 6 (six) hours as needed for wheezing or shortness of breath., Disp: 1 Inhaler, Rfl: 5 .  cephALEXin (KEFLEX) 500 MG capsule, Take 1 capsule (500 mg total) by mouth 2 (two) times daily., Disp: 14 capsule, Rfl: 0 .  cetirizine (ZYRTEC) 10 MG tablet, TAKE ONE TABLET EVERY DAY, Disp: 30 tablet, Rfl: 5 .  clopidogrel (PLAVIX) 75 MG tablet, Take 1 tablet (75 mg total) by mouth daily., Disp: 90 tablet, Rfl: 3 .  diphenhydrAMINE (BENADRYL) 25 MG tablet, Take 25 mg by mouth daily as needed for allergies., Disp: , Rfl:  .   EPINEPHrine (EPIPEN 2-PAK) 0.3 mg/0.3 mL IJ SOAJ injection, Inject 0.3 mLs (0.3 mg total) into the muscle once., Disp: 1 Device, Rfl: 0 .  estradiol (ESTRACE) 1 MG tablet, TAKE ONE TABLET EVERY DAY, Disp: 90 tablet, Rfl: 1 .  fluticasone (FLONASE) 50 MCG/ACT nasal spray, PLACE 2 SPRAYS INTO BOTH NOSTRILS DAILY. (Patient taking differently: Place 2 sprays into both nostrils daily as needed for allergies. ), Disp: 16 g, Rfl: 2 .  furosemide (LASIX) 20 MG tablet, TAKE ONE TABLET EVERY DAY AS NEEDED, Disp: 90 tablet, Rfl: 1 .  guaiFENesin-codeine 100-10 MG/5ML syrup, Take 5 mLs by mouth 3 (three) times daily as needed for cough., Disp: 120 mL, Rfl: 0 .  ibuprofen (ADVIL,MOTRIN) 200 MG tablet, Take 400-800 mg by mouth daily as needed for headache or moderate pain., Disp: , Rfl:  .  ipratropium-albuterol (DUONEB) 0.5-2.5 (3) MG/3ML SOLN, Take 3 mLs by nebulization every 4 (four) hours as needed. Dx 496 (Patient taking differently: Take 3 mLs by nebulization every 4 (four) hours as needed (wheezing or shortness of breath). Dx 496), Disp: 120 mL, Rfl: 2 .  metoprolol succinate (TOPROL XL) 25 MG 24 hr tablet, Take 0.5 tablets (12.5 mg total) by mouth daily., Disp: 90 tablet, Rfl: 3 .  montelukast (SINGULAIR) 10 MG tablet, TAKE ONE TABLET BY MOUTH DAILY BEDTIME, Disp: 90 tablet, Rfl: 1 .  mupirocin ointment (BACTROBAN) 2 %, Apply 1 application daily as needed topically (FOR [facility] SITE IRRITATION). , Disp: , Rfl:  .  Naphazoline-Pheniramine (OPCON-A) 0.027-0.315 % SOLN, Place 1 drop into both eyes daily as needed (for dry eyes). , Disp: , Rfl:  .  pantoprazole (PROTONIX) 40 MG tablet, Take 40 mg by mouth 2 (two) times daily., Disp: , Rfl:  .  potassium chloride SA (K-DUR) 20 MEQ tablet, Take 1 tablet (20 mEq total) by mouth daily as needed (cramps)., Disp: 30 tablet, Rfl: 1 .  predniSONE (STERAPRED UNI-PAK 21 TAB) 10 MG (21) TBPK tablet, Take as directed in dose pack, Disp: 1 each, Rfl: 0 .  rifaximin (XIFAXAN)  550 MG TABS tablet, Take 1 tablet (550 mg total) by mouth 3 (three) times daily., Disp: 42 tablet, Rfl: 0 .  sodium chloride (OCEAN) 0.65 % SOLN nasal spray, Place 1 spray into both nostrils every 4 (four) hours as needed for congestion. , Disp: , Rfl:  .  sucralfate (CARAFATE) 1 g tablet, TAKE 1 TABLET BY MOUTH 4 TIMES DAILY WITH MEALS AND AT BEDTIME, Disp: 120 tablet,  Rfl: 1 .  White Petrolatum-Mineral Oil (LUBRICANT EYE) OINT, Place 1 application into both eyes 2 (two) times daily as needed (for dry eyes). , Disp: , Rfl:   EXAM:  VITALS per patient if applicable: None.  GENERAL: alert, oriented, appears well and in no acute distress  HEENT: atraumatic, conjunttiva clear, no obvious abnormalities on inspection of external nose and ears  NECK: normal movements of the head and neck  LUNGS: on inspection no signs of respiratory distress, breathing rate appears normal, no obvious gross SOB, gasping or wheezing  CV: no obvious cyanosis  MS: moves all visible extremities without noticeable abnormality  PSYCH/NEURO: pleasant and cooperative, no obvious depression or anxiety, speech and thought processing grossly intact  ASSESSMENT AND PLAN:  Discussed the following assessment and plan:  B12 deficiency She will come in for B12 injection in 2 weeks.  She will be taught how to give herself injections and if she is comfortable doing this at home we can send in a prescription for this.  Dysuria Symptoms have significantly improved with Keflex.  She will finish her course of Keflex.  HLD (hyperlipidemia) Check lipid panel.  Hypokalemia Check potassium.  Kidney lesion She will continue to see oncology.  She will keep her appointment with them in December.  Liver lesion Incidentally noted on recent MRI.  She will keep her 17-month follow-up to have MRI repeated.  Current long-term use of postmenopausal hormone replacement therapy Reinforced the potential long-term risks of this  medication.  She does benefit quite significantly from taking this and she opted to continue taking this as the benefit outweighs the risk for her.  We will continue to discuss this with her in the future.   Social distancing precautions and sick precautions given regarding COVID-19.   I discussed the assessment and treatment plan with the patient. The patient was provided an opportunity to ask questions and all were answered. The patient agreed with the plan and demonstrated an understanding of the instructions.   The patient was advised to call back or seek an in-person evaluation if the symptoms worsen or if the condition fails to improve as anticipated.   Tommi Rumps, MD

## 2018-10-12 ENCOUNTER — Other Ambulatory Visit: Payer: Self-pay | Admitting: Internal Medicine

## 2018-10-13 NOTE — Progress Notes (Signed)
I have reviewed the above note and agree.  Nickolaos Brallier, M.D.  

## 2018-10-31 ENCOUNTER — Other Ambulatory Visit (INDEPENDENT_AMBULATORY_CARE_PROVIDER_SITE_OTHER): Payer: Medicare Other

## 2018-10-31 ENCOUNTER — Telehealth: Payer: Self-pay

## 2018-10-31 ENCOUNTER — Other Ambulatory Visit: Payer: Self-pay | Admitting: Family Medicine

## 2018-10-31 ENCOUNTER — Other Ambulatory Visit: Payer: Self-pay

## 2018-10-31 ENCOUNTER — Ambulatory Visit (INDEPENDENT_AMBULATORY_CARE_PROVIDER_SITE_OTHER): Payer: Medicare Other

## 2018-10-31 DIAGNOSIS — E785 Hyperlipidemia, unspecified: Secondary | ICD-10-CM | POA: Diagnosis not present

## 2018-10-31 DIAGNOSIS — E538 Deficiency of other specified B group vitamins: Secondary | ICD-10-CM

## 2018-10-31 DIAGNOSIS — E876 Hypokalemia: Secondary | ICD-10-CM

## 2018-10-31 LAB — COMPREHENSIVE METABOLIC PANEL
ALT: 13 U/L (ref 0–35)
AST: 17 U/L (ref 0–37)
Albumin: 4.1 g/dL (ref 3.5–5.2)
Alkaline Phosphatase: 89 U/L (ref 39–117)
BUN: 12 mg/dL (ref 6–23)
CO2: 33 mEq/L — ABNORMAL HIGH (ref 19–32)
Calcium: 9.5 mg/dL (ref 8.4–10.5)
Chloride: 102 mEq/L (ref 96–112)
Creatinine, Ser: 0.98 mg/dL (ref 0.40–1.20)
GFR: 56.25 mL/min — ABNORMAL LOW (ref >60.00)
Glucose, Bld: 89 mg/dL (ref 70–99)
Potassium: 3.1 mEq/L — ABNORMAL LOW (ref 3.5–5.1)
Sodium: 140 mEq/L (ref 135–145)
Total Bilirubin: 0.6 mg/dL (ref 0.2–1.2)
Total Protein: 6.5 g/dL (ref 6.0–8.3)

## 2018-10-31 LAB — LIPID PANEL
Cholesterol: 177 mg/dL (ref 0–200)
HDL: 77.9 mg/dL (ref >39.00)
LDL Cholesterol: 84 mg/dL (ref 0–99)
NonHDL: 98.84
Total CHOL/HDL Ratio: 2
Triglycerides: 74 mg/dL (ref 0.0–149.0)
VLDL: 14.8 mg/dL (ref 0.0–40.0)

## 2018-10-31 MED ORDER — POTASSIUM CHLORIDE CRYS ER 20 MEQ PO TBCR: 40.0000 meq | EXTENDED_RELEASE_TABLET | Freq: Every day | ORAL | 0 refills | Status: DC

## 2018-10-31 MED ORDER — CYANOCOBALAMIN 1000 MCG/ML IJ SOLN
1000.0000 ug | Freq: Once | INTRAMUSCULAR | Status: AC
  Administered 2018-10-31: 1000 ug via INTRAMUSCULAR

## 2018-10-31 NOTE — Telephone Encounter (Signed)
Patient presented today for B12 injections and teaching on how to give injections at home.  Pt said that her insurance will only pay for injections that are given in the office therefore she would like to continue to come into office to receive B12 injections.

## 2018-10-31 NOTE — Progress Notes (Addendum)
Patient presented today for B12 injection.  Administered IM in left deltoid.  Patient tolerated well with no signs of distress.  Pt was also scheduled a nurse visit today to get teaching on how to self- administer B12 injections at home.  Pt said that her insurance will only cover injections that are done in the office therefore she would like to continue to come into office to receive injections.  Message sent to Dr. [name] to make him aware.

## 2018-11-01 NOTE — Telephone Encounter (Signed)
Noted. She can continue to have them in the office. Thanks.

## 2018-11-05 ENCOUNTER — Other Ambulatory Visit (INDEPENDENT_AMBULATORY_CARE_PROVIDER_SITE_OTHER): Payer: Medicare Other

## 2018-11-05 ENCOUNTER — Other Ambulatory Visit: Payer: Self-pay

## 2018-11-05 DIAGNOSIS — D509 Iron deficiency anemia, unspecified: Secondary | ICD-10-CM

## 2018-11-05 DIAGNOSIS — E876 Hypokalemia: Secondary | ICD-10-CM | POA: Diagnosis not present

## 2018-11-05 DIAGNOSIS — E538 Deficiency of other specified B group vitamins: Secondary | ICD-10-CM

## 2018-11-05 LAB — POTASSIUM: Potassium: 3.5 mEq/L (ref 3.5–5.1)

## 2018-11-05 NOTE — Addendum Note (Signed)
Addended by: Leeanne Rio on: 11/05/2018 03:10 PM   Modules accepted: Orders

## 2018-11-08 ENCOUNTER — Telehealth: Payer: Self-pay | Admitting: Internal Medicine

## 2018-11-08 NOTE — Telephone Encounter (Signed)
Error

## 2018-11-11 ENCOUNTER — Encounter: Payer: Self-pay | Admitting: Family Medicine

## 2018-11-11 ENCOUNTER — Other Ambulatory Visit: Payer: Self-pay

## 2018-11-11 ENCOUNTER — Ambulatory Visit (INDEPENDENT_AMBULATORY_CARE_PROVIDER_SITE_OTHER): Payer: Medicare Other | Admitting: Family Medicine

## 2018-11-11 VITALS — Ht 63.5 in | Wt 104.2 lb

## 2018-11-11 DIAGNOSIS — Z20822 Contact with and (suspected) exposure to covid-19: Secondary | ICD-10-CM | POA: Insufficient documentation

## 2018-11-11 DIAGNOSIS — R0602 Shortness of breath: Secondary | ICD-10-CM | POA: Diagnosis not present

## 2018-11-11 DIAGNOSIS — R42 Dizziness and giddiness: Secondary | ICD-10-CM | POA: Diagnosis not present

## 2018-11-11 DIAGNOSIS — Z20828 Contact with and (suspected) exposure to other viral communicable diseases: Secondary | ICD-10-CM

## 2018-11-11 DIAGNOSIS — R062 Wheezing: Secondary | ICD-10-CM | POA: Diagnosis not present

## 2018-11-11 MED ORDER — AMOXICILLIN-POT CLAVULANATE 875-125 MG PO TABS: 1.0000 | ORAL_TABLET | Freq: Two times a day (BID) | ORAL | 0 refills | Status: DC

## 2018-11-11 NOTE — Assessment & Plan Note (Signed)
Concern for possible COVID-19 infection though could also represent sinusitis.  Discussed treatment for sinusitis with Augmentin.  Discussed testing for COVID-19 and this was ordered.  Discussed strict quarantine precautions at home.  Discussed that her husband should quarantine as well.  Advised that it would be best if 1 of her family members could pick up her antibiotic from the pharmacy and drop it at her front door without coming in the house.  If they are unable to do this her husband could get this through the drive-through with wearing a mask.  Advised that we could test her husband 7 days after her symptoms started if they desired.  Discussed that she needed to remain on quarantine until we released her.  Advised of reasons to seek medical attention emergently.  Health department forms will be sent through my chart to the patient.

## 2018-11-11 NOTE — Progress Notes (Signed)
Virtual Visit via video Note  This visit type was conducted due to national recommendations for restrictions regarding the COVID-19 pandemic (e.g. social distancing).  This format is felt to be most appropriate for this patient at this time.  All issues noted in this document were discussed and addressed.  No physical exam was performed (except for noted visual exam findings with Video Visits).   I connected with Anne [name] today at 10:30 AM EDT by a video enabled telemedicine application and verified that I am speaking with the correct person using two identifiers. Location patient: home Location provider: work Persons participating in the virtual visit: patient, provider  I discussed the limitations, risks, security and privacy concerns of performing an evaluation and management service by telephone and the availability of in person appointments. I also discussed with the patient that there may be a patient responsible charge related to this service. The patient expressed understanding and agreed to proceed.   Reason for visit: same day visit  HPI: Respiratory symptoms: Patient notes this started on 11/07/2018.  Started with sinus congestion and headache with postnasal drip.  She has felt poorly since then.  She has had some cough, wheezing, and shortness of breath.  She does have a history of asthma and typically she gets the wheezing or shortness of breath with the drainage on her throat.  No fevers.  No chills.  No COVID-19 exposure.  Some dizziness.  She has tried Benadryl as well as her asthma medications.  The inhalers do help though they do not last terribly long.  She notes her husband does not have any symptoms.  Patient notes this is consistent with prior sinus infections and asthma issues.   ROS: See pertinent positives and negatives per HPI.  Past Medical History:  Diagnosis Date  . Allergic rhinitis   . Allergy    multiple, mostly aspirin, levaquin and shellfish.  .  Anemia   . Asthma   . Cataract 2014   exraction with len implant  . Complication of anesthesia    Breathing problems upon waking up. 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  . Compressed cervical disc   . COPD (chronic obstructive pulmonary disease) ([place])   . CVA (cerebral infarction)   . Dyspnea   . Esophageal dysmotility   . Heart murmur    as child  . History of hiatal hernia   . IBS (irritable bowel syndrome)   . Migraine   . PICC (peripherally inserted central catheter) removal 02/20/2017  . PONV (postoperative nausea and vomiting)   . Problems with swallowing    intermittently  . Pulmonary fibrosis ([place])   . Shingles   . Stroke [facility])    slurred speech, drawn face, imaging normal, occurred twice, [facility]-"TIA" if antything" 02/20/17-no residual effects  . Tracheostomy in place [facility])   . Vocal cord paresis     Past Surgical History:  Procedure Laterality Date  . ABDOMINAL HYSTERECTOMY    . APPLICATION OF A-CELL OF HEAD/NECK N/A 02/21/2017   Procedure: APPLICATION OF A-CELL OF HEAD/NECK;  Surgeon: Wallace Going, DO;  Location: [place];  Service: Plastics;  Laterality: N/A;  . BOTOX INJECTION N/A 07/29/2013   Procedure: BOTOX INJECTION;  Surgeon: Jerene Bears, MD;  Location: [facility];  Service: Gastroenterology;  Laterality: N/A;  . BOTOX INJECTION N/A 05/04/2015   Procedure: BOTOX INJECTION;  Surgeon: Jerene Bears, MD;  Location: [facility];  Service: Gastroenterology;  Laterality: N/A;  . BOTOX  INJECTION N/A 02/18/2018   Procedure: BOTOX INJECTION;  Surgeon: Jerene Bears, MD;  Location: [facility];  Service: Gastroenterology;  Laterality: N/A;  . BREAST BIOPSY Left    neg  . BREAST SURGERY Left 2002   bx of skin  . CHOLECYSTECTOMY    . COLONOSCOPY    . ESOPHAGEAL MANOMETRY N/A 12/16/2012   Procedure: ESOPHAGEAL MANOMETRY (EM);  Surgeon: Jerene Bears, MD;  Location: [facility];  Service: Gastroenterology;  Laterality: N/A;  .  ESOPHAGOGASTRODUODENOSCOPY (EGD) WITH PROPOFOL N/A 07/29/2013   Procedure: ESOPHAGOGASTRODUODENOSCOPY (EGD) WITH PROPOFOL;  Surgeon: Jerene Bears, MD;  Location: [facility];  Service: Gastroenterology;  Laterality: N/A;  with botox injection  . ESOPHAGOGASTRODUODENOSCOPY (EGD) WITH PROPOFOL N/A 05/04/2015   Procedure: ESOPHAGOGASTRODUODENOSCOPY (EGD) WITH PROPOFOL;  Surgeon: Jerene Bears, MD;  [facility];  Service: Gastroenterology;  Laterality: N/A;  . ESOPHAGOGASTRODUODENOSCOPY (EGD) WITH PROPOFOL N/A 02/18/2018   Procedure: ESOPHAGOGASTRODUODENOSCOPY (EGD) WITH PROPOFOL;  Surgeon: Jerene Bears, MD;  Location: [facility];  Service: Gastroenterology;  Laterality: N/A;  . EYE SURGERY     Catarct surgery 2014  . Eye Surgery AS Child Left   . INCISION AND DRAINAGE OF WOUND N/A 02/21/2017   Procedure: IRRIGATION AND DEBRIDEMENT WOUND NECK;  Surgeon: Wallace Going, DO;  Location: [place];  Service: Plastics;  Laterality: N/A;  . JEJUNOSTOMY FEEDING TUBE     x2 both failed. no longer has  . LUMBAR LAMINECTOMY/DECOMPRESSION MICRODISCECTOMY Bilateral 05/13/2018   Procedure: Laminectomy and Foraminotomy - Lumbar four-Lumbar five- bilateral;  Surgeon: Eustace Moore, MD;  Location: [place];  Service: Neurosurgery;  Laterality: Bilateral;  . MULTIPLE TOOTH EXTRACTIONS     2 teeth removed  . OOPHORECTOMY    . POSTERIOR CERVICAL FUSION/FORAMINOTOMY N/A 01/10/2017   Procedure: LAMINECTOMY AND FORAMINOTOMY CERVICAL FOUR-CERVICAL FIVE, CERVICAL FIVE-SIX POSTERIOR CERVICAL INSTRUMENT FUSION CERVICAL THREE-CERVICAL SEVEN,CERVICAL LAMINECTOMY CERVICAL THREE-CERVICAL SEVEN.;  Surgeon: Eustace Moore, MD;  Location: [place];  Service: Neurosurgery;  Laterality: N/A;  posterior  . TRACHEOSTOMY  1996   done at [facility], Dr. Kathyrn Sheriff  . TUBAL LIGATION    . VIDEO BRONCHOSCOPY Bilateral 11/20/2012   Procedure: VIDEO BRONCHOSCOPY WITH FLUORO;  Surgeon: Juanito Doom, MD;  Location: [facility];  Service:  Cardiopulmonary;  Laterality: Bilateral;    Family History  Problem Relation Age of Onset  . Asthma Cousin   . COPD Cousin   . Breast cancer Maternal Grandmother 60  . Asthma Father   . Kidney cancer Father   . Arrhythmia Father   . Lung cancer Father   . Lung cancer Paternal Uncle   . COPD Paternal Grandfather   . Alcohol abuse Mother   . Breast cancer Maternal Aunt 52  . Bladder Cancer Neg Hx   . Colon cancer Neg Hx   . Esophageal cancer Neg Hx   . Pancreatic cancer Neg Hx   . Stomach cancer Neg Hx   . Liver disease Neg Hx     SOCIAL HX: Non-smoker.   Current Outpatient Medications:  .  acetaminophen (TYLENOL) 500 MG tablet, Take 500-1,000 mg by mouth 2 (two) times daily as needed for moderate pain or headache. , Disp: , Rfl:  .  albuterol (PROVENTIL HFA;VENTOLIN HFA) 108 (90 Base) MCG/ACT inhaler, Inhale 2 puffs into the lungs every 6 (six) hours as needed for wheezing or shortness of breath., Disp: 1 Inhaler, Rfl: 5 .  cephALEXin (KEFLEX) 500 MG capsule, Take 1 capsule (500 mg total) by mouth  2 (two) times daily., Disp: 14 capsule, Rfl: 0 .  cetirizine (ZYRTEC) 10 MG tablet, TAKE ONE TABLET EVERY DAY, Disp: 30 tablet, Rfl: 5 .  clopidogrel (PLAVIX) 75 MG tablet, Take 1 tablet (75 mg total) by mouth daily., Disp: 90 tablet, Rfl: 3 .  diphenhydrAMINE (BENADRYL) 25 MG tablet, Take 25 mg by mouth daily as needed for allergies., Disp: , Rfl:  .  EPINEPHrine (EPIPEN 2-PAK) 0.3 mg/0.3 mL IJ SOAJ injection, Inject 0.3 mLs (0.3 mg total) into the muscle once., Disp: 1 Device, Rfl: 0 .  estradiol (ESTRACE) 1 MG tablet, TAKE ONE TABLET EVERY DAY, Disp: 90 tablet, Rfl: 1 .  fluticasone (FLONASE) 50 MCG/ACT nasal spray, PLACE 2 SPRAYS INTO BOTH NOSTRILS DAILY. (Patient taking differently: Place 2 sprays into both nostrils daily as needed for allergies. ), Disp: 16 g, Rfl: 2 .  furosemide (LASIX) 20 MG tablet, TAKE ONE TABLET EVERY DAY AS NEEDED, Disp: 90 tablet, Rfl: 1 .   guaiFENesin-codeine 100-10 MG/5ML syrup, Take 5 mLs by mouth 3 (three) times daily as needed for cough., Disp: 120 mL, Rfl: 0 .  ibuprofen (ADVIL,MOTRIN) 200 MG tablet, Take 400-800 mg by mouth daily as needed for headache or moderate pain., Disp: , Rfl:  .  ipratropium-albuterol (DUONEB) 0.5-2.5 (3) MG/3ML SOLN, Take 3 mLs by nebulization every 4 (four) hours as needed. Dx 496 (Patient taking differently: Take 3 mLs by nebulization every 4 (four) hours as needed (wheezing or shortness of breath). Dx 496), Disp: 120 mL, Rfl: 2 .  metoprolol succinate (TOPROL XL) 25 MG 24 hr tablet, Take 0.5 tablets (12.5 mg total) by mouth daily., Disp: 90 tablet, Rfl: 3 .  montelukast (SINGULAIR) 10 MG tablet, TAKE ONE TABLET BY MOUTH DAILY BEDTIME, Disp: 90 tablet, Rfl: 1 .  mupirocin ointment (BACTROBAN) 2 %, Apply 1 application daily as needed topically (FOR [facility] SITE IRRITATION). , Disp: , Rfl:  .  Naphazoline-Pheniramine (OPCON-A) 0.027-0.315 % SOLN, Place 1 drop into both eyes daily as needed (for dry eyes). , Disp: , Rfl:  .  pantoprazole (PROTONIX) 40 MG tablet, Take 1 tablet (40 mg total) by mouth daily. NEEDS OFFICE VISIT FOR FURTHER REFILLS, Disp: 90 tablet, Rfl: 0 .  potassium chloride SA (K-DUR) 20 MEQ tablet, Take 2 tablets (40 mEq total) by mouth daily., Disp: 6 tablet, Rfl: 0 .  predniSONE (STERAPRED UNI-PAK 21 TAB) 10 MG (21) TBPK tablet, Take as directed in dose pack, Disp: 1 each, Rfl: 0 .  rifaximin (XIFAXAN) 550 MG TABS tablet, Take 1 tablet (550 mg total) by mouth 3 (three) times daily., Disp: 42 tablet, Rfl: 0 .  sodium chloride (OCEAN) 0.65 % SOLN nasal spray, Place 1 spray into both nostrils every 4 (four) hours as needed for congestion. , Disp: , Rfl:  .  sucralfate (CARAFATE) 1 g tablet, TAKE 1 TABLET BY MOUTH 4 TIMES DAILY WITH MEALS AND AT BEDTIME, Disp: 120 tablet, Rfl: 1 .  White Petrolatum-Mineral Oil (LUBRICANT EYE) OINT, Place 1 application into both eyes 2 (two) times daily as needed  (for dry eyes). , Disp: , Rfl:  .  amoxicillin-clavulanate (AUGMENTIN) 875-125 MG tablet, Take 1 tablet by mouth 2 (two) times daily., Disp: 14 tablet, Rfl: 0  EXAM:  VITALS per patient if applicable: None  GENERAL: alert, oriented, appears well and in no acute distress  HEENT: atraumatic, conjunttiva clear, no obvious abnormalities on inspection of external nose and ears  NECK: normal movements of the head and neck  LUNGS:  on inspection no signs of respiratory distress, breathing rate appears normal, no obvious gross SOB, gasping or wheezing  CV: no obvious cyanosis  MS: moves all visible extremities without noticeable abnormality   ASSESSMENT AND PLAN:  Discussed the following assessment and plan:  Suspected Covid-19 Virus Infection Concern for possible COVID-19 infection though could also represent sinusitis.  Discussed treatment for sinusitis with Augmentin.  Discussed testing for COVID-19 and this was ordered.  Discussed strict quarantine precautions at home.  Discussed that her husband should quarantine as well.  Advised that it would be best if 1 of her family members could pick up her antibiotic from the pharmacy and drop it at her front door without coming in the house.  If they are unable to do this her husband could get this through the drive-through with wearing a mask.  Advised that we could test her husband 7 days after her symptoms started if they desired.  Discussed that she needed to remain on quarantine until we released her.  Advised of reasons to seek medical attention emergently.  Health department forms will be sent through my chart to the patient.    I discussed the assessment and treatment plan with the patient. The patient was provided an opportunity to ask questions and all were answered. The patient agreed with the plan and demonstrated an understanding of the instructions.   The patient was advised to call back or seek an in-person evaluation if the symptoms  worsen or if the condition fails to improve as anticipated.    Tommi Rumps, MD

## 2018-11-12 LAB — NOVEL CORONAVIRUS, NAA: SARS-CoV-2, NAA: NOT DETECTED

## 2018-11-13 ENCOUNTER — Encounter (INDEPENDENT_AMBULATORY_CARE_PROVIDER_SITE_OTHER): Payer: Self-pay

## 2018-11-18 ENCOUNTER — Encounter: Payer: Self-pay | Admitting: Family Medicine

## 2018-11-20 ENCOUNTER — Telehealth: Payer: Self-pay

## 2018-11-20 NOTE — Telephone Encounter (Signed)
Copied from [employer] [id number]. Topic: General - Other >> Nov 20, 2018  2:12 PM Wynetta Emery, [place] C wrote: Reason for CRM: pt called in to check the status and to get a response from her mychart message. Pt says that she has to get the flu shot that doesn't have egg in it. Pt would like to know if they are available to pt's as well?    Please assist >> Nov 20, 2018  2:19 PM Wynetta Emery, [place] C wrote: .... When went back to pt to advise that I had to send a message, pt had disconnected. Called pt back, lvm advising her that I sent a message to provider for her.

## 2018-11-22 NOTE — Telephone Encounter (Signed)
I called and left a message for the patient to call and schedule a nurse visit for the egg-free flu vaccine.  This vaccinie has been ordered and will not be in until next week and I informed her to schedule for next Friday for her vaccine.  Sherelle Castelli,cma

## 2018-11-25 NOTE — Telephone Encounter (Signed)
Pt called back and she would like to speak with Gae Bon about her egg free flu shot.  Pt wants to know if this can be saved for her she doesn't think that she can do it next Friday. Pt would like to have this done after she comes back from vacation - this would be October 3rd. Please call pt on cell phone number.

## 2018-11-26 ENCOUNTER — Telehealth: Payer: Self-pay | Admitting: Internal Medicine

## 2018-11-26 MED ORDER — AMOXICILLIN-POT CLAVULANATE 250-62.5 MG/5ML PO SUSR: 500.0000 mg | Freq: Three times a day (TID) | ORAL | 0 refills | Status: DC

## 2018-11-26 NOTE — Telephone Encounter (Signed)
Vaughan Basta, I am OK with proceeding with the antibiotics at this time for a 1-week course. Augmentin 500 mg TID x 7-days. Please proceed with ordering. Thanks. GM

## 2018-11-26 NOTE — Telephone Encounter (Signed)
Pt reported that her IBS symptoms have returned and requested to be prescribed antibiotics--stated that Xifaxan is too expensive.

## 2018-11-26 NOTE — Telephone Encounter (Signed)
Pt has issues with IBS and gets antibiotics when she is having issues. She cannot afford Xifaxan. Dr. Hilarie Fredrickson usually prescribes Augmentin 500 mg 3 times a day liquid x7 days. Pt calling requesting antibiotics again for a flare. As DOD please advise.

## 2018-11-26 NOTE — Telephone Encounter (Signed)
Pt states she has been having her usual symptoms of abdominal pain, gas and constipation alternating with diarrhea. Reports this started about a week and a half-2 weeks ago. Stated she did not want to let it go to long, states last time it went on to long and Dr. Hilarie Fredrickson told her to call sooner next time. Please advise.

## 2018-11-26 NOTE — Telephone Encounter (Signed)
Anne [name], I was reading through Dr. Tana Felts notes and at some point he had wanted her to obtain culture data. What IBS symptoms are we currently treating for the patient? Please update me and then we can decide on antibiotics or not since Dr. Duaine Dredge is already concerned about disrupting the microbiology every time we give antibiotics that are not rifaximin she will have increased risk of further changes to her microbiology. Thanks. GM

## 2018-11-26 NOTE — Telephone Encounter (Signed)
Pt aware, script sent to pharmacy. 

## 2018-11-27 NOTE — Telephone Encounter (Signed)
Called and left a message for patient to check mychart for her new appointment.  Nina,cma

## 2018-12-04 ENCOUNTER — Ambulatory Visit (INDEPENDENT_AMBULATORY_CARE_PROVIDER_SITE_OTHER): Payer: Medicare Other

## 2018-12-04 ENCOUNTER — Ambulatory Visit: Payer: Medicare Other

## 2018-12-04 ENCOUNTER — Other Ambulatory Visit: Payer: Self-pay

## 2018-12-04 DIAGNOSIS — E538 Deficiency of other specified B group vitamins: Secondary | ICD-10-CM

## 2018-12-04 MED ORDER — CYANOCOBALAMIN 1000 MCG/ML IJ SOLN
1000.0000 ug | Freq: Once | INTRAMUSCULAR | Status: AC
  Administered 2018-12-04: 1000 ug via INTRAMUSCULAR

## 2018-12-04 NOTE — Progress Notes (Signed)
Patient presented for B 12 injection to left deltoid, patient voiced no concerns nor showed any signs of distress during injection. 

## 2018-12-06 ENCOUNTER — Other Ambulatory Visit: Payer: Self-pay | Admitting: Family Medicine

## 2018-12-10 NOTE — Progress Notes (Signed)
I have reviewed the above note and agree.  Efton Thomley, M.D.  

## 2018-12-11 ENCOUNTER — Ambulatory Visit: Payer: Medicare Other | Admitting: Family Medicine

## 2018-12-27 ENCOUNTER — Ambulatory Visit: Payer: Medicare Other

## 2018-12-30 ENCOUNTER — Other Ambulatory Visit: Payer: Medicare Other

## 2018-12-30 ENCOUNTER — Inpatient Hospital Stay: Payer: Medicare Other

## 2018-12-31 ENCOUNTER — Ambulatory Visit: Payer: Medicare Other

## 2018-12-31 ENCOUNTER — Other Ambulatory Visit: Payer: Medicare Other

## 2018-12-31 ENCOUNTER — Ambulatory Visit: Payer: Medicare Other | Admitting: Hematology and Oncology

## 2019-01-03 ENCOUNTER — Encounter: Payer: Self-pay | Admitting: Family Medicine

## 2019-01-03 ENCOUNTER — Ambulatory Visit (INDEPENDENT_AMBULATORY_CARE_PROVIDER_SITE_OTHER): Payer: Medicare Other | Admitting: Family Medicine

## 2019-01-03 ENCOUNTER — Other Ambulatory Visit: Payer: Self-pay

## 2019-01-03 ENCOUNTER — Ambulatory Visit: Payer: Self-pay | Admitting: *Deleted

## 2019-01-03 VITALS — Ht 63.5 in | Wt 104.6 lb

## 2019-01-03 DIAGNOSIS — J0100 Acute maxillary sinusitis, unspecified: Secondary | ICD-10-CM

## 2019-01-03 DIAGNOSIS — J441 Chronic obstructive pulmonary disease with (acute) exacerbation: Secondary | ICD-10-CM | POA: Insufficient documentation

## 2019-01-03 MED ORDER — METHYLPREDNISOLONE 4 MG PO TBPK: ORAL_TABLET | ORAL | 0 refills | Status: DC

## 2019-01-03 MED ORDER — AMOXICILLIN-POT CLAVULANATE 875-125 MG PO TABS: 1.0000 | ORAL_TABLET | Freq: Two times a day (BID) | ORAL | 0 refills | Status: AC

## 2019-01-03 NOTE — Progress Notes (Signed)
Virtual Visit via Video Note  I connected with Anne [name] on 01/03/19 at  4:00 PM EDT by a video enabled telemedicine application and verified that I am speaking with the correct person using two identifiers. Location patient: home Location provider: work  Persons participating in the virtual visit: patient, provider  I discussed the limitations of evaluation and management by telemedicine and the availability of in person appointments. The patient expressed understanding and agreed to proceed.  Chief Complaint  Patient presents with  . Acute Visit    Denies fever, has needed her rescue inhaler, Sx started 12/28/18   . Cough    Has tried taking guaifenesin, only slightly resolves cough  . Nasal Congestion  . nasal drainage    HPI: Anne [name] is a 68 y.o. female who is a patient of Dr. [name] and complains of 6 day h/o cough, nasal congestion, runny nose, PND. Pt endorses B/L ear pressure, fullness. + hoarse voice especially later in the day.  No fever, chills. No SOB. No CP.  She has tried taking guaifenesin, benadryl, coricidin with minimal relief. Pt states she gets the same symptoms each year around this time. She has multiple antibiotic allergies listed in her chart but states she has taken azithromycin in the past without issue. She has also tolerated augmentin but does note GI side effects with it.   Her PMHx is significant for asthma and COPD. She also has allergies. She takes zyrtec, singulair and uses flonase daily. She has been using nasal saline spray as well   Past Medical History:  Diagnosis Date  . Allergic rhinitis   . Allergy    multiple, mostly aspirin, levaquin and shellfish.  . Anemia   . Asthma   . Cataract 2014   exraction with len implant  . Complication of anesthesia    Breathing problems upon waking up. 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  . Compressed cervical disc   . COPD (chronic obstructive pulmonary disease)  ([place])   . CVA (cerebral infarction)   . Dyspnea   . Esophageal dysmotility   . Heart murmur    as child  . History of hiatal hernia   . IBS (irritable bowel syndrome)   . Migraine   . PICC (peripherally inserted central catheter) removal 02/20/2017  . PONV (postoperative nausea and vomiting)   . Problems with swallowing    intermittently  . Pulmonary fibrosis ([place])   . Shingles   . Stroke [facility])    slurred speech, drawn face, imaging normal, occurred twice, [facility]-"TIA" if antything" 02/20/17-no residual effects  . Tracheostomy in place [facility])   . Vocal cord paresis     Past Surgical History:  Procedure Laterality Date  . ABDOMINAL HYSTERECTOMY    . APPLICATION OF A-CELL OF HEAD/NECK N/A 02/21/2017   Procedure: APPLICATION OF A-CELL OF HEAD/NECK;  Surgeon: Wallace Going, DO;  Location: [place];  Service: Plastics;  Laterality: N/A;  . BOTOX INJECTION N/A 07/29/2013   Procedure: BOTOX INJECTION;  Surgeon: Jerene Bears, MD;  [facility];  Service: Gastroenterology;  Laterality: N/A;  . BOTOX INJECTION N/A 05/04/2015   Procedure: BOTOX INJECTION;  Surgeon: Jerene Bears, MD;  [facility];  Service: Gastroenterology;  Laterality: N/A;  . BOTOX INJECTION N/A 02/18/2018   Procedure: BOTOX INJECTION;  Surgeon: Jerene Bears, MD;  [facility];  Service: Gastroenterology;  Laterality: N/A;  . BREAST BIOPSY Left    neg  .  BREAST SURGERY Left 2002   bx of skin  . CHOLECYSTECTOMY    . COLONOSCOPY    . ESOPHAGEAL MANOMETRY N/A 12/16/2012   Procedure: ESOPHAGEAL MANOMETRY (EM);  Surgeon: Jerene Bears, MD;  [facility];  Service: Gastroenterology;  Laterality: N/A;  . ESOPHAGOGASTRODUODENOSCOPY (EGD) WITH PROPOFOL N/A 07/29/2013   Procedure: ESOPHAGOGASTRODUODENOSCOPY (EGD) WITH PROPOFOL;  Surgeon: Jerene Bears, MD;  [facility];  Service: Gastroenterology;  Laterality: N/A;  with botox injection  . ESOPHAGOGASTRODUODENOSCOPY (EGD) WITH  PROPOFOL N/A 05/04/2015   Procedure: ESOPHAGOGASTRODUODENOSCOPY (EGD) WITH PROPOFOL;  Surgeon: Jerene Bears, MD;  [facility];  Service: Gastroenterology;  Laterality: N/A;  . ESOPHAGOGASTRODUODENOSCOPY (EGD) WITH PROPOFOL N/A 02/18/2018   Procedure: ESOPHAGOGASTRODUODENOSCOPY (EGD) WITH PROPOFOL;  Surgeon: Jerene Bears, MD;  [facility];  Service: Gastroenterology;  Laterality: N/A;  . EYE SURGERY     Catarct surgery 2014  . Eye Surgery AS Child Left   . INCISION AND DRAINAGE OF WOUND N/A 02/21/2017   Procedure: IRRIGATION AND DEBRIDEMENT WOUND NECK;  Surgeon: Wallace Going, DO;  Location: [place];  Service: Plastics;  Laterality: N/A;  . JEJUNOSTOMY FEEDING TUBE     x2 both failed. no longer has  . LUMBAR LAMINECTOMY/DECOMPRESSION MICRODISCECTOMY Bilateral 05/13/2018   Procedure: Laminectomy and Foraminotomy - Lumbar four-Lumbar five- bilateral;  Surgeon: Eustace Moore, MD;  Location: [place];  Service: Neurosurgery;  Laterality: Bilateral;  . MULTIPLE TOOTH EXTRACTIONS     2 teeth removed  . OOPHORECTOMY    . POSTERIOR CERVICAL FUSION/FORAMINOTOMY N/A 01/10/2017   Procedure: LAMINECTOMY AND FORAMINOTOMY CERVICAL FOUR-CERVICAL FIVE, CERVICAL FIVE-SIX POSTERIOR CERVICAL INSTRUMENT FUSION CERVICAL THREE-CERVICAL SEVEN,CERVICAL LAMINECTOMY CERVICAL THREE-CERVICAL SEVEN.;  Surgeon: Eustace Moore, MD;  Location: [place];  Service: Neurosurgery;  Laterality: N/A;  posterior  . TRACHEOSTOMY  1996   done at [facility], Dr. Kathyrn Sheriff  . TUBAL LIGATION    . VIDEO BRONCHOSCOPY Bilateral 11/20/2012   Procedure: VIDEO BRONCHOSCOPY WITH FLUORO;  Surgeon: Juanito Doom, MD;  [facility];  Service: Cardiopulmonary;  Laterality: Bilateral;    Family History  Problem Relation Age of Onset  . Asthma Cousin   . COPD Cousin   . Breast cancer Maternal Grandmother 60  . Asthma Father   . Kidney cancer Father   . Arrhythmia Father   . Lung cancer Father   . Lung cancer  Paternal Uncle   . COPD Paternal Grandfather   . Alcohol abuse Mother   . Breast cancer Maternal Aunt 42  . Bladder Cancer Neg Hx   . Colon cancer Neg Hx   . Esophageal cancer Neg Hx   . Pancreatic cancer Neg Hx   . Stomach cancer Neg Hx   . Liver disease Neg Hx     Social History   Tobacco Use  . Smoking status: Never Smoker  . Smokeless tobacco: Never Used  Substance Use Topics  . Alcohol use: Yes    Alcohol/week: 0.0 standard drinks    Comment: occ.  . Drug use: No     Current Outpatient Medications:  .  acetaminophen (TYLENOL) 500 MG tablet, Take 500-1,000 mg by mouth 2 (two) times daily as needed for moderate pain or headache. , Disp: , Rfl:  .  cetirizine (ZYRTEC) 10 MG tablet, TAKE ONE TABLET EVERY DAY, Disp: 30 tablet, Rfl: 5 .  clopidogrel (PLAVIX) 75 MG tablet, Take 1 tablet (75 mg total) by mouth daily., Disp: 90 tablet, Rfl: 3 .  diphenhydrAMINE (  BENADRYL) 25 MG tablet, Take 25 mg by mouth daily as needed for allergies., Disp: , Rfl:  .  estradiol (ESTRACE) 1 MG tablet, TAKE ONE TABLET EVERY DAY, Disp: 90 tablet, Rfl: 1 .  fluticasone (FLONASE) 50 MCG/ACT nasal spray, PLACE 2 SPRAYS INTO BOTH NOSTRILS DAILY. (Patient taking differently: Place 2 sprays into both nostrils daily as needed for allergies. ), Disp: 16 g, Rfl: 2 .  furosemide (LASIX) 20 MG tablet, TAKE ONE TABLET EVERY DAY AS NEEDED, Disp: 90 tablet, Rfl: 1 .  guaiFENesin-codeine 100-10 MG/5ML syrup, Take 5 mLs by mouth 3 (three) times daily as needed for cough., Disp: 120 mL, Rfl: 0 .  ibuprofen (ADVIL,MOTRIN) 200 MG tablet, Take 400-800 mg by mouth daily as needed for headache or moderate pain., Disp: , Rfl:  .  ipratropium-albuterol (DUONEB) 0.5-2.5 (3) MG/3ML SOLN, Take 3 mLs by nebulization every 4 (four) hours as needed. Dx 496 (Patient taking differently: Take 3 mLs by nebulization every 4 (four) hours as needed (wheezing or shortness of breath). Dx 496), Disp: 120 mL, Rfl: 2 .  metoprolol succinate  (TOPROL XL) 25 MG 24 hr tablet, Take 0.5 tablets (12.5 mg total) by mouth daily., Disp: 90 tablet, Rfl: 3 .  montelukast (SINGULAIR) 10 MG tablet, TAKE ONE TABLET BY MOUTH DAILY BEDTIME, Disp: 90 tablet, Rfl: 1 .  mupirocin ointment (BACTROBAN) 2 %, Apply 1 application daily as needed topically (FOR [facility] SITE IRRITATION). , Disp: , Rfl:  .  Naphazoline-Pheniramine (OPCON-A) 0.027-0.315 % SOLN, Place 1 drop into both eyes daily as needed (for dry eyes). , Disp: , Rfl:  .  pantoprazole (PROTONIX) 40 MG tablet, Take 1 tablet (40 mg total) by mouth daily. NEEDS OFFICE VISIT FOR FURTHER REFILLS, Disp: 90 tablet, Rfl: 0 .  potassium chloride SA (K-DUR) 20 MEQ tablet, Take 2 tablets (40 mEq total) by mouth daily., Disp: 6 tablet, Rfl: 0 .  rifaximin (XIFAXAN) 550 MG TABS tablet, Take 1 tablet (550 mg total) by mouth 3 (three) times daily., Disp: 42 tablet, Rfl: 0 .  sodium chloride (OCEAN) 0.65 % SOLN nasal spray, Place 1 spray into both nostrils every 4 (four) hours as needed for congestion. , Disp: , Rfl:  .  sucralfate (CARAFATE) 1 g tablet, TAKE 1 TABLET BY MOUTH 4 TIMES DAILY WITH MEALS AND AT BEDTIME, Disp: 120 tablet, Rfl: 1 .  VENTOLIN HFA 108 (90 Base) MCG/ACT inhaler, INHALE 2 PUFFS INTO LUNGS EVERY 6 HOURS AS NEEDED FOR WHEEZING OR SHORTNESS OF BREATH, Disp: 18 g, Rfl: 1 .  White Petrolatum-Mineral Oil (LUBRICANT EYE) OINT, Place 1 application into both eyes 2 (two) times daily as needed (for dry eyes). , Disp: , Rfl:  .  EPINEPHrine (EPIPEN 2-PAK) 0.3 mg/0.3 mL IJ SOAJ injection, Inject 0.3 mLs (0.3 mg total) into the muscle once. (Patient not taking: Reported on 01/03/2019), Disp: 1 Device, Rfl: 0  Allergies  Allergen Reactions  . Asa [Aspirin] Shortness Of Breath, Swelling and Other (See Comments)    Tongue swells  . Bee Venom Anaphylaxis and Shortness Of Breath  . Feraheme [Ferumoxytol] Other (See Comments)    Throat squeezing  . Influenza Vaccines Shortness Of Breath and Other (See  Comments)  . Quinolones Swelling and Other (See Comments)    Feet swell  . Shellfish Allergy Anaphylaxis  . Ciprofloxacin Swelling and Other (See Comments)    ALL MEDS ENDING IN -FLOXACIN MAKE THE FEET SWELL  . Clarithromycin Nausea Only  . Erythromycin Nausea Only  .  Levaquin [Levofloxacin In D5w] Swelling and Other (See Comments)    Feet and legs ache and SWELL  . Peanut-Containing Drug Products Other (See Comments)    Wheezing (boiled or raw peanuts)  . Septra [Sulfamethoxazole-Trimethoprim] Diarrhea  . Telithromycin Nausea Only  . Zanaflex [Tizanidine] Other (See Comments)    Caused the patient to feel "spaced out" and "not well"  . Adhesive [Tape] Rash and Other (See Comments)    Paper works tape works fine   . Epinephrine Palpitations and Other (See Comments)    Also makes the heart race      ROS: See pertinent positives and negatives per HPI.   EXAM:  VITALS per patient if applicable: Ht 5' 3.5" (1.613 m)   Wt 104 lb 9.6 oz (47.4 kg)   BMI 18.24 kg/m    GENERAL: alert, oriented, appears well and in no acute distress  HEENT: atraumatic, conjunctiva clear, no obvious abnormalities on inspection of external nose and ears  LUNGS: on inspection no signs of respiratory distress, breathing rate appears normal, no obvious gross SOB, gasping or wheezing, no conversational dyspnea  CV: no obvious cyanosis  MS: moves all visible extremities without noticeable abnormality  PSYCH/NEURO: pleasant and cooperative, speech and thought processing grossly intact   ASSESSMENT AND PLAN: 1. Acute maxillary sinusitis, recurrence not specified - symptoms x 1 week, not improving. Pt states she has similar symptoms annually around this time. She has been taking her regular daily zyrtec, singulair, flonase, nasal saline spray and advised her to continue to do so - increase water intake, can add mucinex 1 tab BID x 5 days Rx: - methylPREDNISolone (MEDROL DOSEPAK) 4 MG TBPK tablet;  Take as directed  Dispense: 21 tablet; Refill: 0 - amoxicillin-clavulanate (AUGMENTIN) 875-125 MG tablet; Take 1 tablet by mouth 2 (two) times daily for 7 days.  Dispense: 14 tablet; Refill: 0 - pt has multiple abx allergies listed but has tolerated this in the past with only some minor GI side effects - f/u if symptoms worsen or do not improve in 7-10 days Discussed plan and reviewed medications with patient, including risks, benefits, and potential side effects. Pt expressed understand. All questions answered.     I discussed the assessment and treatment plan with the patient. The patient was provided an opportunity to ask questions and all were answered. The patient agreed with the plan and demonstrated an understanding of the instructions.   The patient was advised to call back or seek an in-person evaluation if the symptoms worsen or if the condition fails to improve as anticipated.   Letta Median, DO

## 2019-01-03 NOTE — Telephone Encounter (Signed)
   Reason for Disposition . Earache is present  Answer Assessment - Initial Assessment Questions 1. ONSET: "When did the cough begin?"      1 week ago 2. SEVERITY: "How bad is the cough today?"      Moderate cough  3. RESPIRATORY DISTRESS: "Describe your breathing."      Slight shortness of breath  4. FEVER: "Do you have a fever?" If so, ask: "What is your temperature, how was it measured, and when did it start?"     No fever  5. HEMOPTYSIS: "Are you coughing up any blood?" If so ask: "How much?" (flecks, streaks, tablespoons, etc.)    No  6. TREATMENT: "What have you done so far to treat the cough?" (e.g., meds, fluids, humidifier)     Choricidin, xyrtec, singulair, guafenex, ibuprofen  7. CARDIAC HISTORY: "Do you have any history of heart disease?" (e.g., heart attack, congestive heart failure)      Denies  8. LUNG HISTORY: "Do you have any history of lung disease?"  (e.g., pulmonary embolus, asthma, emphysema)     Asthma, scarring of lungs 9. PE RISK FACTORS: "Do you have a history of blood clots?" (or: recent major surgery, recent prolonged travel, bedridden)     Denies  10. OTHER SYMPTOMS: "Do you have any other symptoms? (e.g., runny nose, wheezing, chest pain)       Ear pressure, nasal congestion . PREGNANCY: "Is there any chance you are pregnant?" "When was your last menstrual period?"       N/A  12. TRAVEL: "Have you traveled out of the country in the last month?" (e.g., travel history, exposures)       N/A  Protocols used: COUGH - ACUTE NON-PRODUCTIVE-A-AH  Patient states she has had moderate cough, sinus drainage, nasal congestion, and ear pressure for one week. Denies fever.  She has tried xyrtec, singulair, Guafenex, ibuprofen, but symptoms persist.  She states she experiences similar symptoms every fall, but they usually improve with OTC treatment, and if not she has to get an antibiotic.  Advised patient she needs evaluation today.  Scheduled her for virtual visit with  Dr. Bryan Lemma because her PCP office has no availability.  Patient agreeable to virtual visit.

## 2019-01-03 NOTE — Telephone Encounter (Signed)
Message from Oneta Rack sent at 01/03/2019 2:40 PM EDT  Summary: clinical advice   Patient experiencing nasal congestion, coughing, no fever seeking clinical advice. Patient tried sudafed not working seeking OTC medication

## 2019-01-04 ENCOUNTER — Other Ambulatory Visit: Payer: Self-pay | Admitting: Family Medicine

## 2019-01-07 ENCOUNTER — Ambulatory Visit: Payer: Self-pay | Admitting: *Deleted

## 2019-01-07 NOTE — Telephone Encounter (Signed)
Pt scheduled for flu shot 01/08/2019; she is on prednisone and antibiotics; she is concerned about getting flu shot; per Butch Penny and her clinical lead at [facility], the pt has to be symptom free x 7 days before receiving shot; pt notified and verbalized understanding; pt transferred to Butch Penny to reschedule; she sees Dr [name].  Answer Assessment - Initial Assessment Questions 1. REASON FOR CALL or QUESTION: "What is your reason for calling today?" or "How can I best help you?" or "What question do you have that I can help answer?"     Should pt take flu shot  Protocols used: [place]

## 2019-01-07 NOTE — Telephone Encounter (Signed)
Rescheduled patient's nurse visit appt for egg-free flu shot and B-12 injection to 01/15/19 @ 2:00 pm.

## 2019-01-07 NOTE — Telephone Encounter (Signed)
  Reason for Disposition . [1] Caller requesting NON-URGENT health information AND [2] PCP's office is the best resource  Protocols used: INFORMATION ONLY CALL - NO TRIAGE-A-AH  

## 2019-01-08 ENCOUNTER — Other Ambulatory Visit: Payer: Medicare Other

## 2019-01-08 ENCOUNTER — Ambulatory Visit: Payer: Medicare Other

## 2019-01-13 NOTE — Progress Notes (Signed)
Confirmed Name, DOB, and Address. Reports night sweats x 1 month. Denies any other concerns at this time.

## 2019-01-14 ENCOUNTER — Other Ambulatory Visit: Payer: Self-pay

## 2019-01-14 ENCOUNTER — Inpatient Hospital Stay: Payer: Medicare Other | Attending: Hematology and Oncology

## 2019-01-14 DIAGNOSIS — D509 Iron deficiency anemia, unspecified: Secondary | ICD-10-CM | POA: Diagnosis not present

## 2019-01-14 DIAGNOSIS — E538 Deficiency of other specified B group vitamins: Secondary | ICD-10-CM | POA: Diagnosis not present

## 2019-01-14 DIAGNOSIS — Z79899 Other long term (current) drug therapy: Secondary | ICD-10-CM | POA: Insufficient documentation

## 2019-01-14 LAB — CBC WITH DIFFERENTIAL/PLATELET
Abs Immature Granulocytes: 0.02 10*3/uL (ref 0.00–0.07)
Basophils Absolute: 0.1 10*3/uL (ref 0.0–0.1)
Basophils Relative: 1 %
Eosinophils Absolute: 0.2 10*3/uL (ref 0.0–0.5)
Eosinophils Relative: 5 %
HCT: 38 % (ref 36.0–46.0)
Hemoglobin: 13.1 g/dL (ref 12.0–15.0)
Immature Granulocytes: 0 %
Lymphocytes Relative: 27 %
Lymphs Abs: 1.4 10*3/uL (ref 0.7–4.0)
MCH: 32.7 pg (ref 26.0–34.0)
MCHC: 34.5 g/dL (ref 30.0–36.0)
MCV: 94.8 fL (ref 80.0–100.0)
Monocytes Absolute: 0.5 10*3/uL (ref 0.1–1.0)
Monocytes Relative: 10 %
Neutro Abs: 3 10*3/uL (ref 1.7–7.7)
Neutrophils Relative %: 57 %
Platelets: 184 10*3/uL (ref 150–400)
RBC: 4.01 MIL/uL (ref 3.87–5.11)
RDW: 12.1 % (ref 11.5–15.5)
WBC: 5.2 10*3/uL (ref 4.0–10.5)
nRBC: 0 % (ref 0.0–0.2)

## 2019-01-14 LAB — FERRITIN: Ferritin: 145 ng/mL (ref 11–307)

## 2019-01-14 NOTE — Telephone Encounter (Signed)
Error

## 2019-01-14 NOTE — Progress Notes (Signed)
[facility]  [address] [place], [place] [address] Phone: [phone]  Fax: [phone]   Clinic Day:  01/15/2019  Referring physician: [name], MD  Chief Complaint: Anne [name] is a 68 y.o. female with iron deficiency who is seen for 6 month assessment   HPI: The patient was last seen in the hematology clinic on 07/01/2018. At that time, she felt better.  Gastritis had improved on Carafate and Protonix.  She denied any bleeding.  Hematocrit was 42.4, hemoglobin 14.2, and MCV 95.9 on 06/12/2018.  Ferritin was 203.  Ferritin was 163 on 09/30/2018 and 143 on 03/16/2019.  She received monthly B12 on 12/04/2018.  During the interim, she notes being a little dizzy at home.  She is on metoprolol 12.5 mg a day.  She takes Carafate occasionally.  She eats yogurt at night.  She has a slow digestive system system.  She has a "tight lower esophageal sphincter" and needs Botox at times.  She receives B12 monthly at Dr. [name] office.  She had a flu and B12 injection with Dr. [name] today.  She states that she ate aches a lot in her joints.  She denies any melena, hematochezia or hematuria.  She has a follow-up with the [facility] urologist regarding her kidney lesion on 03/17/2019.  She has an MRI at that time.   Past Medical History:  Diagnosis Date  . Allergic rhinitis   . Allergy    multiple, mostly aspirin, levaquin and shellfish.  . Anemia   . Asthma   . Cataract 2014   exraction with len implant  . Complication of anesthesia    Breathing problems upon waking up. 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  . Compressed cervical disc   . COPD (chronic obstructive pulmonary disease) ([place])   . CVA (cerebral infarction)   . Dyspnea   . Esophageal dysmotility   . Heart murmur    as child  . History of hiatal hernia   . IBS (irritable bowel syndrome)   . Migraine   . PICC (peripherally inserted central  catheter) removal 02/20/2017  . PONV (postoperative nausea and vomiting)   . Problems with swallowing    intermittently  . Pulmonary fibrosis ([place])   . Shingles   . Stroke [facility])    slurred speech, drawn face, imaging normal, occurred twice, [facility]-"TIA" if antything" 02/20/17-no residual effects  . Tracheostomy in place [facility])   . Vocal cord paresis     Past Surgical History:  Procedure Laterality Date  . ABDOMINAL HYSTERECTOMY    . APPLICATION OF A-CELL OF HEAD/NECK N/A 02/21/2017   Procedure: APPLICATION OF A-CELL OF HEAD/NECK;  Surgeon: Wallace Going, DO;  Location: [place];  Service: Plastics;  Laterality: N/A;  . BOTOX INJECTION N/A 07/29/2013   Procedure: BOTOX INJECTION;  Surgeon: Jerene Bears, MD;  Location: [facility];  Service: Gastroenterology;  Laterality: N/A;  . BOTOX INJECTION N/A 05/04/2015   Procedure: BOTOX INJECTION;  Surgeon: Jerene Bears, MD;  Location: [facility];  Service: Gastroenterology;  Laterality: N/A;  . BOTOX INJECTION N/A 02/18/2018   Procedure: BOTOX INJECTION;  Surgeon: Jerene Bears, MD;  [facility];  Service: Gastroenterology;  Laterality: N/A;  . BREAST BIOPSY Left    neg  . BREAST SURGERY Left 2002   bx of skin  . CHOLECYSTECTOMY    . COLONOSCOPY    . ESOPHAGEAL MANOMETRY N/A 12/16/2012   Procedure: ESOPHAGEAL MANOMETRY (EM);  Surgeon: Jerene Bears, MD;  Location: [facility];  Service: Gastroenterology;  Laterality: N/A;  . ESOPHAGOGASTRODUODENOSCOPY (EGD) WITH PROPOFOL N/A 07/29/2013   Procedure: ESOPHAGOGASTRODUODENOSCOPY (EGD) WITH PROPOFOL;  Surgeon: Jerene Bears, MD;  [facility];  Service: Gastroenterology;  Laterality: N/A;  with botox injection  . ESOPHAGOGASTRODUODENOSCOPY (EGD) WITH PROPOFOL N/A 05/04/2015   Procedure: ESOPHAGOGASTRODUODENOSCOPY (EGD) WITH PROPOFOL;  Surgeon: Jerene Bears, MD;  [facility];  Service: Gastroenterology;  Laterality: N/A;  . ESOPHAGOGASTRODUODENOSCOPY (EGD) WITH  PROPOFOL N/A 02/18/2018   Procedure: ESOPHAGOGASTRODUODENOSCOPY (EGD) WITH PROPOFOL;  Surgeon: Jerene Bears, MD;  [facility];  Service: Gastroenterology;  Laterality: N/A;  . EYE SURGERY     Catarct surgery 2014  . Eye Surgery AS Child Left   . INCISION AND DRAINAGE OF WOUND N/A 02/21/2017   Procedure: IRRIGATION AND DEBRIDEMENT WOUND NECK;  Surgeon: Wallace Going, DO;  Location: [place];  Service: Plastics;  Laterality: N/A;  . JEJUNOSTOMY FEEDING TUBE     x2 both failed. no longer has  . LUMBAR LAMINECTOMY/DECOMPRESSION MICRODISCECTOMY Bilateral 05/13/2018   Procedure: Laminectomy and Foraminotomy - Lumbar four-Lumbar five- bilateral;  Surgeon: Eustace Moore, MD;  Location: [place];  Service: Neurosurgery;  Laterality: Bilateral;  . MULTIPLE TOOTH EXTRACTIONS     2 teeth removed  . OOPHORECTOMY    . POSTERIOR CERVICAL FUSION/FORAMINOTOMY N/A 01/10/2017   Procedure: LAMINECTOMY AND FORAMINOTOMY CERVICAL FOUR-CERVICAL FIVE, CERVICAL FIVE-SIX POSTERIOR CERVICAL INSTRUMENT FUSION CERVICAL THREE-CERVICAL SEVEN,CERVICAL LAMINECTOMY CERVICAL THREE-CERVICAL SEVEN.;  Surgeon: Eustace Moore, MD;  Location: [place];  Service: Neurosurgery;  Laterality: N/A;  posterior  . TRACHEOSTOMY  1996   done at [facility], Dr. Kathyrn Sheriff  . TUBAL LIGATION    . VIDEO BRONCHOSCOPY Bilateral 11/20/2012   Procedure: VIDEO BRONCHOSCOPY WITH FLUORO;  Surgeon: Juanito Doom, MD;  Location[facility];  Service: Cardiopulmonary;  Laterality: Bilateral;    Family History  Problem Relation Age of Onset  . Asthma Cousin   . COPD Cousin   . Breast cancer Maternal Grandmother 60  . Asthma Father   . Kidney cancer Father   . Arrhythmia Father   . Lung cancer Father   . Lung cancer Paternal Uncle   . COPD Paternal Grandfather   . Alcohol abuse Mother   . Breast cancer Maternal Aunt 48  . Bladder Cancer Neg Hx   . Colon cancer Neg Hx   . Esophageal cancer Neg Hx   . Pancreatic cancer Neg Hx   . Stomach  cancer Neg Hx   . Liver disease Neg Hx     Social History:  reports that she has never smoked. She has never used smokeless tobacco. She reports current alcohol use. She reports that she does not use drugs.  Patient is retired from an Sports coach. She ultimately went out on disability in 2001 following a tracheostomy. The patient is alone today.  Allergies:  Allergies  Allergen Reactions  . Asa [Aspirin] Shortness Of Breath, Swelling and Other (See Comments)    Tongue swells  . Bee Venom Anaphylaxis and Shortness Of Breath  . Feraheme [Ferumoxytol] Other (See Comments)    Throat squeezing  . Influenza Vaccines Shortness Of Breath and Other (See Comments)  . Quinolones Swelling and Other (See Comments)    Feet swell  . Shellfish Allergy Anaphylaxis  . Ciprofloxacin Swelling and Other (See Comments)    ALL MEDS ENDING IN -FLOXACIN MAKE THE FEET SWELL  . Clarithromycin Nausea Only  .  Erythromycin Nausea Only  . Levaquin [Levofloxacin In D5w] Swelling and Other (See Comments)    Feet and legs ache and SWELL  . Peanut-Containing Drug Products Other (See Comments)    Wheezing (boiled or raw peanuts)  . Septra [Sulfamethoxazole-Trimethoprim] Diarrhea  . Telithromycin Nausea Only  . Zanaflex [Tizanidine] Other (See Comments)    Caused the patient to feel "spaced out" and "not well"  . Adhesive [Tape] Rash and Other (See Comments)    Paper works tape works fine   . Epinephrine Palpitations and Other (See Comments)    Also makes the heart race    Current Medications: Current Outpatient Medications  Medication Sig Dispense Refill  . acetaminophen (TYLENOL) 500 MG tablet Take 500-1,000 mg by mouth 2 (two) times daily as needed for moderate pain or headache.     . cetirizine (ZYRTEC) 10 MG tablet TAKE ONE TABLET BY MOUTH EVERY DAY 30 tablet 5  . clopidogrel (PLAVIX) 75 MG tablet Take 1 tablet (75 mg total) by mouth daily. 90 tablet 3  . diphenhydrAMINE (BENADRYL) 25 MG tablet  Take 25 mg by mouth daily as needed for allergies.    [name] EPINEPHrine (EPIPEN 2-PAK) 0.3 mg/0.3 mL IJ SOAJ injection Inject 0.3 mLs (0.3 mg total) into the muscle once. 1 Device 0  . estradiol (ESTRACE) 1 MG tablet TAKE ONE TABLET EVERY DAY 90 tablet 1  . fluticasone (FLONASE) 50 MCG/ACT nasal spray PLACE 2 SPRAYS INTO BOTH NOSTRILS DAILY. (Patient taking differently: Place 2 sprays into both nostrils daily as needed for allergies. ) 16 g 2  . furosemide (LASIX) 20 MG tablet TAKE ONE TABLET EVERY DAY AS NEEDED 90 tablet 1  . ibuprofen (ADVIL,MOTRIN) 200 MG tablet Take 400-800 mg by mouth daily as needed for headache or moderate pain.    [name] ipratropium-albuterol (DUONEB) 0.5-2.5 (3) MG/3ML SOLN Take 3 mLs by nebulization every 4 (four) hours as needed. Dx 496 (Patient taking differently: Take 3 mLs by nebulization every 4 (four) hours as needed (wheezing or shortness of breath). Dx 496) 120 mL 2  . metoprolol succinate (TOPROL XL) 25 MG 24 hr tablet Take 0.5 tablets (12.5 mg total) by mouth daily. 90 tablet 3  . montelukast (SINGULAIR) 10 MG tablet TAKE ONE TABLET BY MOUTH DAILY BEDTIME 90 tablet 1  . mupirocin ointment (BACTROBAN) 2 % Apply 1 application daily as needed topically (FOR [facility] SITE IRRITATION).     . Naphazoline-Pheniramine (OPCON-A) 0.027-0.315 % SOLN Place 1 drop into both eyes daily as needed (for dry eyes).     . pantoprazole (PROTONIX) 40 MG tablet Take 1 tablet (40 mg total) by mouth daily. NEEDS OFFICE VISIT FOR FURTHER REFILLS 90 tablet 0  . potassium chloride SA (K-DUR) 20 MEQ tablet Take 2 tablets (40 mEq total) by mouth daily. 6 tablet 0  . sodium chloride (OCEAN) 0.65 % SOLN nasal spray Place 1 spray into both nostrils every 4 (four) hours as needed for congestion.     . sucralfate (CARAFATE) 1 g tablet TAKE 1 TABLET BY MOUTH 4 TIMES DAILY WITH MEALS AND AT BEDTIME (Patient taking differently: as needed. ) 120 tablet 1  . VENTOLIN HFA 108 (90 Base) MCG/ACT inhaler INHALE 2 PUFFS  INTO LUNGS EVERY 6 HOURS AS NEEDED FOR WHEEZING OR SHORTNESS OF BREATH 18 g 1  . White Petrolatum-Mineral Oil (LUBRICANT EYE) OINT Place 1 application into both eyes 2 (two) times daily as needed (for dry eyes).     [name] guaiFENesin-codeine 100-10 MG/5ML syrup  Take 5 mLs by mouth 3 (three) times daily as needed for cough. (Patient not taking: Reported on 01/13/2019) 120 mL 0   No current facility-administered medications for this visit.     Review of Systems  Constitutional: Negative for chills, diaphoresis, fever, malaise/fatigue and weight loss (up 2 pounds).       Feels "cold".  HENT: Negative.  Negative for congestion, ear pain, nosebleeds, sinus pain and sore throat.   Eyes: Negative.  Negative for blurred vision and double vision.  Respiratory: Negative.  Negative for cough, hemoptysis, sputum production and shortness of breath.   Cardiovascular: Negative.  Negative for chest pain, palpitations, orthopnea and leg swelling.  Gastrointestinal: Negative.  Negative for abdominal pain, blood in stool, diarrhea, melena, nausea and vomiting.       IBS.  Slow digestive system.  Takes Carafate occasionally.  Genitourinary: Negative for dysuria, flank pain, frequency, hematuria and urgency.       Renal lesion on surveillance.  Musculoskeletal: Positive for joint pain (aches a lot). Negative for back pain, falls, myalgias and neck pain.  Skin: Negative.  Negative for itching and rash.  Neurological: Positive for sensory change (left leg numb again). Negative for dizziness, tremors, speech change, focal weakness, weakness and headaches.  Endo/Heme/Allergies: Negative.  Does not bruise/bleed easily.  Psychiatric/Behavioral: Negative.  Negative for depression and memory loss. The patient is not nervous/anxious and does not have insomnia.    Performance status (ECOG):  1  Vitals Blood pressure (!) 106/44, pulse 60, temperature (!) 97.3 F (36.3 C), temperature source Tympanic, resp. rate 18, height  5' 3.5" (1.613 m), weight 106 lb 2.4 oz (48.1 kg), SpO2 100 %.   Physical Exam  Constitutional: She is oriented to person, place, and time.  Thin woman sitting comfortably in the exam room knitting in no acute distress.  HENT:  Head: Normocephalic and atraumatic.  Mouth/Throat: Oropharynx is clear and moist. No oropharyngeal exudate.  Short brown hair.  Mask.  Eyes: Pupils are equal, round, and reactive to light. Conjunctivae and EOM are normal. No scleral icterus.  Glasses.  Neck: Normal range of motion. Neck supple. No JVD present.  Cardiovascular: Normal rate and normal heart sounds. Exam reveals no gallop.  No murmur heard. Pulmonary/Chest: Effort normal and breath sounds normal. No respiratory distress. She has no wheezes. She has no rales.  Abdominal: Soft. Bowel sounds are normal. She exhibits no distension and no mass. There is no abdominal tenderness. There is no rebound and no guarding.  Musculoskeletal: Normal range of motion.        General: No edema.  Lymphadenopathy:       Head (right side): No preauricular, no posterior auricular and no occipital adenopathy present.       Head (left side): No preauricular, no posterior auricular and no occipital adenopathy present.    She has no cervical adenopathy.    She has no axillary adenopathy.       Right: No inguinal and no supraclavicular adenopathy present.       Left: No inguinal and no supraclavicular adenopathy present.  Neurological: She is alert and oriented to person, place, and time.  Skin: Skin is warm and dry. No rash noted. No erythema. No pallor.  Psychiatric: She has a normal mood and affect. Her behavior is normal. Judgment normal.  Nursing note and vitals reviewed.   Appointment on 01/14/2019  Component Date Value Ref Range Status  . Ferritin 01/14/2019 145  11 - 307 ng/mL Final  Performed at [facility], [address], [place], [place] [address]  . WBC 01/14/2019 5.2  4.0 - 10.5 K/uL Final  .  RBC 01/14/2019 4.01  3.87 - 5.11 MIL/uL Final  . Hemoglobin 01/14/2019 13.1  12.0 - 15.0 g/dL Final  . HCT 01/14/2019 38.0  36.0 - 46.0 % Final  . MCV 01/14/2019 94.8  80.0 - 100.0 fL Final  . MCH 01/14/2019 32.7  26.0 - 34.0 pg Final  . MCHC 01/14/2019 34.5  30.0 - 36.0 g/dL Final  . RDW 01/14/2019 12.1  11.5 - 15.5 % Final  . Platelets 01/14/2019 184  150 - 400 K/uL Final  . nRBC 01/14/2019 0.0  0.0 - 0.2 % Final  . Neutrophils Relative % 01/14/2019 57  % Final  . Neutro Abs 01/14/2019 3.0  1.7 - 7.7 K/uL Final  . Lymphocytes Relative 01/14/2019 27  % Final  . Lymphs Abs 01/14/2019 1.4  0.7 - 4.0 K/uL Final  . Monocytes Relative 01/14/2019 10  % Final  . Monocytes Absolute 01/14/2019 0.5  0.1 - 1.0 K/uL Final  . Eosinophils Relative 01/14/2019 5  % Final  . Eosinophils Absolute 01/14/2019 0.2  0.0 - 0.5 K/uL Final  . Basophils Relative 01/14/2019 1  % Final  . Basophils Absolute 01/14/2019 0.1  0.0 - 0.1 K/uL Final  . Immature Granulocytes 01/14/2019 0  % Final  . Abs Immature Granulocytes 01/14/2019 0.02  0.00 - 0.07 K/uL Final   Performed at [facility], [address] [place], [place] [address]    Assessment:  SARENNA CORFIELD is a 68 y.o. female with iron deficiency anemia.  Diet is poor.  She was unresponsive to oral iron.  EGD on 10/02/2017 revealed a normal stomach and duodenum.  The GE junction was patulous.  Random duodenal biopsies revealed no evidence of sprue, active inflammation or other disorders.  Colonoscopy on 10/02/2017 was normal.  She received Feraheme 510 mg IV on 10/25/2017 and 10/31/2017.  With her second infusion, she developed an itchy throat.  She received Benadryl and Solumedrol.   Ferritin has been followed:  30.2 on 09/03/2012, 8 on 08/09/2017, 7.2 on 10/15/2017, 255 on 12/11/2017, 213 on 03/13/2018, 203 on 06/12/2018, 163 on 09/30/2018, and 145 on 01/14/2019.  She has vitamin B12 deficiency.  B12 was 621 on 04/28/2014. She receives  monthly parenteral B12 (last 01/15/2019).   MRI abdomen on 03/29/2017 revealed a stable to minimal increase in size of the 1.2 cm enhancing lesion arising from the upper pole of left kidney which is suspicious for a small renal neoplasm.  MRI of [facility] abdomen at [facility] on 10/22/2017 revealed a 1.4 cm T2 hyperintense, T1 hypointense with central nodule/scar in the left superior pole.  Size was similar to prior imaging.  A small renal cell carcinoma or oncocytoma was considered.  She is followed by Dr[facility] Louanne Skye at [facility].  Symptomatically, she is doing well.  She denies any bleeding.  Exam is stable.  Plan: 1.   Review labs from 01/14/2019. 2.   Iron deficiency anemia Hematocrit 38.0.  Hemoglobin 13.1.  MCV 94.8. Ferritin 145. Ferritin slowly drifting down. Discuss adequate hemoglobin and ferritin. No Feraheme needed. Continue to monitor. 3.   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  Follow-up MRI scheduled for 03/17/2019.       Continue surveillance. 4.   B12 deficiency  Patient received monthly B12 through Dr [name] office today.  Check folate annually. 5.   RTC in 6 months for labs (CBC with diff, ferritin, iron studies). 6.   RTC in 1 year for MD assessment, labs (CBC with diff, ferritin- day before), and +/- Feraheme.  I discussed the assessment and treatment plan with the patient.  The patient was provided an opportunity to ask questions and all were answered.  The patient agreed with the plan and demonstrated an understanding of the instructions.  The patient was advised to call back if the symptoms worsen or if the condition fails to improve as anticipated.   Lequita Asal, MD, PhD    01/15/2019, 10:21 AM

## 2019-01-15 ENCOUNTER — Encounter: Payer: Self-pay | Admitting: Hematology and Oncology

## 2019-01-15 ENCOUNTER — Inpatient Hospital Stay: Payer: Medicare Other

## 2019-01-15 ENCOUNTER — Other Ambulatory Visit: Payer: Self-pay

## 2019-01-15 ENCOUNTER — Inpatient Hospital Stay (HOSPITAL_BASED_OUTPATIENT_CLINIC_OR_DEPARTMENT_OTHER): Payer: Medicare Other | Admitting: Hematology and Oncology

## 2019-01-15 ENCOUNTER — Ambulatory Visit (INDEPENDENT_AMBULATORY_CARE_PROVIDER_SITE_OTHER): Payer: Medicare Other

## 2019-01-15 VITALS — BP 106/44 | HR 60 | Temp 97.3°F | Resp 18 | Ht 63.5 in | Wt 106.2 lb

## 2019-01-15 DIAGNOSIS — E538 Deficiency of other specified B group vitamins: Secondary | ICD-10-CM

## 2019-01-15 DIAGNOSIS — N289 Disorder of kidney and ureter, unspecified: Secondary | ICD-10-CM

## 2019-01-15 DIAGNOSIS — D509 Iron deficiency anemia, unspecified: Secondary | ICD-10-CM | POA: Diagnosis not present

## 2019-01-15 DIAGNOSIS — Z23 Encounter for immunization: Secondary | ICD-10-CM

## 2019-01-15 MED ORDER — CYANOCOBALAMIN 1000 MCG/ML IJ SOLN
1000.0000 ug | Freq: Once | INTRAMUSCULAR | Status: AC
  Administered 2019-01-15: 14:00:00 1000 ug via INTRAMUSCULAR

## 2019-01-15 NOTE — Progress Notes (Signed)
The patient does c/o dizziness with standing at home

## 2019-01-15 NOTE — Progress Notes (Signed)
Patient presented for B12 injection today.  Administered IM in the right deltoid.  Patient tolerated well with no signs of distress.

## 2019-01-21 ENCOUNTER — Other Ambulatory Visit: Payer: Self-pay | Admitting: Internal Medicine

## 2019-01-23 ENCOUNTER — Other Ambulatory Visit: Payer: Self-pay | Admitting: Internal Medicine

## 2019-01-23 ENCOUNTER — Other Ambulatory Visit: Payer: Self-pay

## 2019-01-23 MED ORDER — PANTOPRAZOLE SODIUM 40 MG PO TBEC: 40.0000 mg | DELAYED_RELEASE_TABLET | Freq: Every day | ORAL | 0 refills | Status: DC

## 2019-01-31 ENCOUNTER — Other Ambulatory Visit: Payer: Self-pay | Admitting: Physical Medicine and Rehabilitation

## 2019-01-31 DIAGNOSIS — M5412 Radiculopathy, cervical region: Secondary | ICD-10-CM

## 2019-02-12 ENCOUNTER — Ambulatory Visit (INDEPENDENT_AMBULATORY_CARE_PROVIDER_SITE_OTHER): Payer: Medicare Other | Admitting: Nurse Practitioner

## 2019-02-12 ENCOUNTER — Encounter: Payer: Self-pay | Admitting: Nurse Practitioner

## 2019-02-12 VITALS — BP 90/60 | HR 56 | Temp 97.6°F | Ht 63.5 in | Wt 104.8 lb

## 2019-02-12 DIAGNOSIS — Z7901 Long term (current) use of anticoagulants: Secondary | ICD-10-CM

## 2019-02-12 DIAGNOSIS — K219 Gastro-esophageal reflux disease without esophagitis: Secondary | ICD-10-CM | POA: Diagnosis not present

## 2019-02-12 DIAGNOSIS — K224 Dyskinesia of esophagus: Secondary | ICD-10-CM

## 2019-02-12 DIAGNOSIS — K220 Achalasia of cardia: Secondary | ICD-10-CM | POA: Diagnosis not present

## 2019-02-12 MED ORDER — PANTOPRAZOLE SODIUM 40 MG PO TBEC: 40.0000 mg | DELAYED_RELEASE_TABLET | Freq: Every day | ORAL | 3 refills | Status: DC

## 2019-02-12 NOTE — Progress Notes (Signed)
Chief Complaint: Refill on pantoprazole  IMPRESSION and PLAN:    68 year old female with history of CVA, chronic Plavix, COPD/pulmonary fibrosis, GERD /  esophageal dysphagia with hypertensive LES responsive to Botox injection, vocal cord dysfunction, IBS with chronic constipation and abdominal pain, history of IDA and migraines.  1.  GERD-asymptomatic on pantoprazole -Refill pantoprazole per patient request.  Number 90-day supply given  2.  Esophageal dysphagia with hypertensive LES.  Nice response to Botox 1 year ago  Patient has started seeing a slow recurrence of symptoms, mainly regurgitation. . -With the holidays and COVID-19 pandemic procedure slots may not be as plentiful right now. She would like to get on the schedule for repeat EGD with Botox injection of LES. --The risks and benefits of EGD were discussed and the patient agrees to proceed.   3. Hx of CVA on plavix.  -Hold Plavix for 5 days before procedure - will instruct when and how to resume after procedure. Patient understands that there is a low but real risk of cardiovascular event such as heart attack, stroke, or embolism /  thrombosis, or ischemia while off Plavix. The patient consents to proceed. Will communicate by phone or EMR with patient's prescribing provider to confirm that holding Plavix is reasonable in this case.   4.  Multiple drug allergies HPI:     Patient is a 68 year old female with a history of CVA, chronic plavix, COPD / pulmonary fibrosis, esophageal dysphagia with hypertensive LES responsive to Botox injection,  vocal cord dysfunction,  IBS with chronic constipation and abdominal pain, hx IDA, and migraines.  She is followed by Dr. Hilarie Fredrickson.  At the time of last visit in May of this year she was continued on probiotics, recommended to use so Carafate 1 g 3 times daily and at bedtime.  For GERD she was continued on pantoprazole 40 mg daily.   Anne [name] is here for a refill on Protonix.  No  heartburn or acid regurgitation on daily PPI.  She has decreased dose of Carafate from before meals and at bedtime to taking on an as-needed basis.  Trigger for taking dose of Carafate is epigastric discomfort / indigestion and it does help.  She has been able to manage the episodes but has some concern about the these recurrent symptoms and asks about having repeat EGD with botox of LES. She has occasional solid food dysphagia but main issue is regurgitation.  Her weight is stable fluctuating only a couple of pounds. It has been nearly a year since her last LES Botox injection    Data Reviewed:  10/31/2018 Cr 0.98, normal liver tests   Review of systems:     No chest pain, no SOB, no fevers, no urinary sx   Past Medical History:  Diagnosis Date  . Allergic rhinitis   . Allergy    multiple, mostly aspirin, levaquin and shellfish.  . Anemia   . Asthma   . Cataract 2014   exraction with len implant  . Complication of anesthesia    Breathing problems upon waking up. 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  . Compressed cervical disc   . COPD (chronic obstructive pulmonary disease) ([place])   . CVA (cerebral infarction)   . Esophageal dysmotility   . Heart murmur    as child  . History of hiatal hernia   . IBS (irritable bowel syndrome)   . Migraine   . PICC (peripherally inserted central catheter) removal 02/20/2017  .  PONV (postoperative nausea and vomiting)   . Problems with swallowing    intermittently  . Pulmonary fibrosis ([place])   . Shingles   . Stroke [facility])    slurred speech, drawn face, imaging normal, occurred twice, [facility]-"TIA" if antything" 02/20/17-no residual effects  . Tracheostomy in place [facility])   . Vocal cord paresis     Patient's surgical history, family medical history, social history, medications and allergies were all reviewed in Epic     Current Outpatient Medications  Medication Sig Dispense Refill  . acetaminophen (TYLENOL) 500 MG  tablet Take 500-1,000 mg by mouth 2 (two) times daily as needed for moderate pain or headache.     . cetirizine (ZYRTEC) 10 MG tablet TAKE ONE TABLET BY MOUTH EVERY DAY 30 tablet 5  . clopidogrel (PLAVIX) 75 MG tablet Take 1 tablet (75 mg total) by mouth daily. 90 tablet 3  . diphenhydrAMINE (BENADRYL) 25 MG tablet Take 25 mg by mouth daily as needed for allergies.    [name] EPINEPHrine (EPIPEN 2-PAK) 0.3 mg/0.3 mL IJ SOAJ injection Inject 0.3 mLs (0.3 mg total) into the muscle once. 1 Device 0  . estradiol (ESTRACE) 1 MG tablet TAKE ONE TABLET EVERY DAY 90 tablet 1  . fluticasone (FLONASE) 50 MCG/ACT nasal spray PLACE 2 SPRAYS INTO BOTH NOSTRILS DAILY. (Patient taking differently: Place 2 sprays into both nostrils daily as needed for allergies. ) 16 g 2  . furosemide (LASIX) 20 MG tablet TAKE ONE TABLET EVERY DAY AS NEEDED 90 tablet 1  . guaiFENesin-codeine 100-10 MG/5ML syrup Take 5 mLs by mouth 3 (three) times daily as needed for cough. (Patient not taking: Reported on 01/13/2019) 120 mL 0  . ibuprofen (ADVIL,MOTRIN) 200 MG tablet Take 400-800 mg by mouth daily as needed for headache or moderate pain.    [name] ipratropium-albuterol (DUONEB) 0.5-2.5 (3) MG/3ML SOLN Take 3 mLs by nebulization every 4 (four) hours as needed. Dx 496 (Patient taking differently: Take 3 mLs by nebulization every 4 (four) hours as needed (wheezing or shortness of breath). Dx 496) 120 mL 2  . metoprolol succinate (TOPROL XL) 25 MG 24 hr tablet Take 0.5 tablets (12.5 mg total) by mouth daily. 90 tablet 3  . montelukast (SINGULAIR) 10 MG tablet TAKE ONE TABLET BY MOUTH DAILY BEDTIME 90 tablet 1  . mupirocin ointment (BACTROBAN) 2 % Apply 1 application daily as needed topically (FOR [facility] SITE IRRITATION).     . Naphazoline-Pheniramine (OPCON-A) 0.027-0.315 % SOLN Place 1 drop into both eyes daily as needed (for dry eyes).     . pantoprazole (PROTONIX) 40 MG tablet Take 1 tablet (40 mg total) by mouth daily. NEEDS OFFICE VISIT FOR  FURTHER REFILLS 90 tablet 0  . potassium chloride SA (K-DUR) 20 MEQ tablet Take 2 tablets (40 mEq total) by mouth daily. 6 tablet 0  . sodium chloride (OCEAN) 0.65 % SOLN nasal spray Place 1 spray into both nostrils every 4 (four) hours as needed for congestion.     . sucralfate (CARAFATE) 1 g tablet TAKE 1 TABLET BY MOUTH 4 TIMES DAILY WITH MEALS AND AT BEDTIME (Patient taking differently: as needed. ) 120 tablet 1  . VENTOLIN HFA 108 (90 Base) MCG/ACT inhaler INHALE 2 PUFFS INTO LUNGS EVERY 6 HOURS AS NEEDED FOR WHEEZING OR SHORTNESS OF BREATH 18 g 1  . White Petrolatum-Mineral Oil (LUBRICANT EYE) OINT Place 1 application into both eyes 2 (two) times daily as needed (for dry eyes).      No current  facility-administered medications for this visit.     Physical Exam:     BP 90/60   Pulse (!) 56   Temp 97.6 F (36.4 C)   Ht 5' 3.5" (1.613 m)   Wt 104 lb 12.8 oz (47.5 kg)   BMI 18.27 kg/m   GENERAL:  Pleasant female in NAD PSYCH: : Cooperative, normal affect EENT:  conjunctiva pink, mucous membranes moist, neck supple without masses CARDIAC:  RRR,  no peripheral edema PULM: Normal respiratory effort, lungs CTA bilaterally, no wheezing ABDOMEN:  Nondistended, soft, nontender. No obvious masses, no hepatomegaly,  normal bowel sounds SKIN:  turgor, no lesions seen Musculoskeletal:  Normal muscle tone, normal strength NEURO: Alert and oriented x 3, no focal neurologic deficits   Anne [name] , NP 02/12/2019, 10:41 AM

## 2019-02-12 NOTE — Patient Instructions (Addendum)
If you are age 69 or older, your body mass index should be between 23-30. Your Body mass index is 18.27 kg/m. If this is out of the aforementioned range listed, please consider follow up with your Primary Care Provider.  If you are age 56 or younger, your body mass index should be between 19-25. Your Body mass index is 18.27 kg/m. If this is out of the aformentioned range listed, please consider follow up with your Primary Care Provider.   We will contact you regarding EGD w/Botox at [facility] with Dr. Hilarie Fredrickson when the schedule for January becomes available.  We will contact Dr. [name] regarding holding your  Plavix five days prior to your procedure.  Thank you for choosing me and [facility].   Tye Savoy, NP

## 2019-02-13 ENCOUNTER — Ambulatory Visit
Admission: RE | Admit: 2019-02-13 | Discharge: 2019-02-13 | Disposition: A | Discharge status: Home / Self Care | Payer: Medicare Other | Source: Ambulatory Visit | Attending: Physical Medicine and Rehabilitation | Admitting: Physical Medicine and Rehabilitation

## 2019-02-13 ENCOUNTER — Other Ambulatory Visit: Payer: Self-pay

## 2019-02-13 DIAGNOSIS — M5412 Radiculopathy, cervical region: Secondary | ICD-10-CM

## 2019-02-14 ENCOUNTER — Encounter: Payer: Self-pay | Admitting: Nurse Practitioner

## 2019-02-17 ENCOUNTER — Ambulatory Visit (INDEPENDENT_AMBULATORY_CARE_PROVIDER_SITE_OTHER): Payer: Medicare Other

## 2019-02-17 ENCOUNTER — Other Ambulatory Visit: Payer: Self-pay

## 2019-02-17 DIAGNOSIS — E538 Deficiency of other specified B group vitamins: Secondary | ICD-10-CM

## 2019-02-17 MED ORDER — CYANOCOBALAMIN 1000 MCG/ML IJ SOLN
1000.0000 ug | Freq: Once | INTRAMUSCULAR | Status: AC
  Administered 2019-02-17: 1000 ug via INTRAMUSCULAR

## 2019-02-17 NOTE — Progress Notes (Addendum)
Patient presented today for B12 injection.  Administered IM in the right deltoid.  Patient tolerated well with no signs of distress.  Reviewed.  Dr Scott  

## 2019-02-18 NOTE — Progress Notes (Signed)
Addendum: Reviewed and agree with assessment and management plan. Promise Weldin M, MD  

## 2019-03-04 ENCOUNTER — Other Ambulatory Visit: Payer: Self-pay | Admitting: Family Medicine

## 2019-03-04 ENCOUNTER — Encounter: Payer: Self-pay | Admitting: Family Medicine

## 2019-03-05 ENCOUNTER — Other Ambulatory Visit: Payer: Self-pay | Admitting: Family Medicine

## 2019-03-05 NOTE — Telephone Encounter (Signed)
Not in pt's current medication list. Looks like it was last refilled in March 2020.  Last OV: 11/11/2018 Next OV: 04/18/2019

## 2019-03-06 MED ORDER — FUROSEMIDE 20 MG PO TABS: ORAL_TABLET | ORAL | 1 refills | Status: DC

## 2019-03-12 ENCOUNTER — Other Ambulatory Visit: Payer: Self-pay

## 2019-03-12 ENCOUNTER — Encounter: Payer: Self-pay | Admitting: Pulmonary Disease

## 2019-03-12 ENCOUNTER — Ambulatory Visit: Payer: Medicare Other | Admitting: Pulmonary Disease

## 2019-03-12 VITALS — BP 106/72 | HR 75 | Temp 97.8°F | Ht 63.0 in | Wt 104.0 lb

## 2019-03-12 DIAGNOSIS — Z930 Tracheostomy status: Secondary | ICD-10-CM

## 2019-03-12 DIAGNOSIS — J4530 Mild persistent asthma, uncomplicated: Secondary | ICD-10-CM

## 2019-03-12 DIAGNOSIS — J8410 Pulmonary fibrosis, unspecified: Secondary | ICD-10-CM

## 2019-03-12 DIAGNOSIS — J0110 Acute frontal sinusitis, unspecified: Secondary | ICD-10-CM

## 2019-03-12 MED ORDER — AZITHROMYCIN 250 MG PO TABS: ORAL_TABLET | ORAL | 0 refills | Status: DC

## 2019-03-12 NOTE — Progress Notes (Signed)
 Assessment & Plan:  1. Chronic tracheostomy tube (Primary)  2. Mild persistent asthma without complication  3. Mild pleural-parenchymal fibroelastosis  4. Acute frontal sinusitis, recurrence not specified   Patient Instructions  1.  Continue tracheostomy care as you are doing.  2.  We have written for Z-Pak for if your sinusitis symptoms persist.  3.  Follow-up in 6 months time call sooner should any new difficulties arise.  Please note: late entry documentation due to logistical difficulties during COVID-19 pandemic. This note is filed for information purposes only, and is not intended to be used for billing, nor does it represent the full scope/nature of the visit in question. Please see any associated scanned media linked to date of encounter for additional pertinent information.  Subjective:    HPI: Anne [name] is a 68 y.o. female presenting to the pulmonology clinic on 03/12/2019 with report of: Follow-up (pt reports of nasal congestion, non prod cough and occ wheezing x6d)     Outpatient Encounter Medications as of 03/12/2019  Medication Sig Note   acetaminophen  (TYLENOL ) 500 MG tablet Take 1,000 mg by mouth 2 (two) times daily as needed for moderate pain or headache.    diphenhydrAMINE  (BENADRYL ) 25 MG tablet Take 50 mg by mouth daily as needed for allergies (Asthma).    ibuprofen  (ADVIL ,MOTRIN ) 200 MG tablet Take 400-800 mg by mouth daily as needed for headache or moderate pain.    ipratropium-albuterol  (DUONEB) 0.5-2.5 (3) MG/3ML SOLN Take 3 mLs by nebulization every 4 (four) hours as needed. Dx 496    sodium chloride  (OCEAN) 0.65 % SOLN nasal spray Place 1 spray into both nostrils every 4 (four) hours as needed for congestion.     White Petrolatum-Mineral Oil (LUBRICANT EYE) OINT Place 1 application into both eyes 2 (two) times daily as needed (for dry eyes).     [DISCONTINUED] ALPRAZolam  (XANAX ) 0.25 MG tablet Take 0.5 tablets (0.125 mg total) by mouth every  other day. For 14 days to taper off. (Patient not taking: Reported on 04/18/2019)    [DISCONTINUED] cetirizine  (ZYRTEC ) 10 MG tablet TAKE ONE TABLET BY MOUTH EVERY DAY (Patient taking differently: Take 10 mg by mouth at bedtime. )    [DISCONTINUED] clopidogrel  (PLAVIX ) 75 MG tablet Take 1 tablet (75 mg total) by mouth daily.    [DISCONTINUED] EPINEPHrine  (EPIPEN  2-PAK) 0.3 mg/0.3 mL IJ SOAJ injection Inject 0.3 mLs (0.3 mg total) into the muscle once.    [DISCONTINUED] estradiol  (ESTRACE ) 1 MG tablet TAKE ONE TABLET EVERY DAY    [DISCONTINUED] fluticasone  (FLONASE ) 50 MCG/ACT nasal spray PLACE 2 SPRAYS INTO BOTH NOSTRILS DAILY. (Patient not taking: Reported on 07/22/2020)    [DISCONTINUED] furosemide  (LASIX ) 20 MG tablet TAKE ONE TABLET BY MOUTH EVERY DAY AS NEEDED (Patient taking differently: Take 20 mg by mouth daily. )    [DISCONTINUED] metoprolol  succinate (TOPROL  XL) 25 MG 24 hr tablet Take 0.5 tablets (12.5 mg total) by mouth daily.    [DISCONTINUED] montelukast  (SINGULAIR ) 10 MG tablet TAKE ONE TABLET BY MOUTH DAILY BEDTIME (Patient taking differently: Take 10 mg by mouth at bedtime. )    [DISCONTINUED] mupirocin  ointment (BACTROBAN ) 2 % Apply 1 application topically daily as needed (FOR [facility] SITE IRRITATION).    [DISCONTINUED] Naphazoline-Pheniramine 0.027-0.315 % SOLN Place 1 drop into both eyes daily as needed (allergies).     [DISCONTINUED] pantoprazole  (PROTONIX ) 40 MG tablet Take 1 tablet (40 mg total) by mouth daily. NEEDS OFFICE VISIT FOR FURTHER REFILLS    [DISCONTINUED]  potassium chloride  SA (K-DUR) 20 MEQ tablet Take 2 tablets (40 mEq total) by mouth daily. (Patient not taking: Reported on 07/22/2020) 06/23/2019: Pt can tell by the frequency of cramps when to take this med   [DISCONTINUED] sucralfate  (CARAFATE ) 1 g tablet TAKE 1 TABLET BY MOUTH 4 TIMES DAILY WITH MEALS AND AT BEDTIME (Patient taking differently: Take 1 g by mouth 4 (four) times daily as needed (acid reflux). )     [DISCONTINUED] VENTOLIN  HFA 108 (90 Base) MCG/ACT inhaler INHALE 2 PUFFS INTO LUNGS EVERY 6 HOURS AS NEEDED FOR WHEEZING OR SHORTNESS OF BREATH (Patient taking differently: Inhale 2 puffs into the lungs every 6 (six) hours as needed for wheezing or shortness of breath. )    [DISCONTINUED] azithromycin  (ZITHROMAX  Z-PAK) 250 MG tablet Take as instructed in package (Patient not taking: Reported on 04/18/2019)    No facility-administered encounter medications on file as of 03/12/2019.      Objective:   Vitals:   03/12/19 0931  BP: 106/72  Pulse: 75  Temp: 97.8 F (36.6 C)  Height: 5' 3 (1.6 m)  Weight: 104 lb (47.2 kg)  SpO2: 99%  TempSrc: Temporal  BMI (Calculated): 18.43     Physical exam documentation is limited by delayed entry of information.

## 2019-03-12 NOTE — Patient Instructions (Signed)
1.  Continue tracheostomy care as you are doing.  2.  We have written for Z-Pak for if your sinusitis symptoms persist.  3.  Follow-up in 6 months time call sooner should any new difficulties arise.

## 2019-03-18 ENCOUNTER — Telehealth: Payer: Self-pay | Admitting: Pulmonary Disease

## 2019-03-18 NOTE — Telephone Encounter (Signed)
Spoke to pt, who is requesting to switch to from Dr. Patsey Berthold from Dr. Mortimer Fries, as Dr. Mortimer Fries was recommended by pt's daughter in law. Pt is aware of office protocol.  Pt stated that she was prescribed zpak at last OV for sinusitis. Pt stated that she took three doses of abx with no relief in sx and she developed an upset stomach.  Pt would like to switch to amoxicillin, as her sx are persistent.   DK and LG please advise.  DK please advise on abx, as LG is unavailable.

## 2019-03-18 NOTE — Telephone Encounter (Signed)
Lm for pt

## 2019-03-19 ENCOUNTER — Other Ambulatory Visit: Payer: Self-pay

## 2019-03-19 ENCOUNTER — Ambulatory Visit (INDEPENDENT_AMBULATORY_CARE_PROVIDER_SITE_OTHER): Payer: Medicare Other

## 2019-03-19 DIAGNOSIS — E538 Deficiency of other specified B group vitamins: Secondary | ICD-10-CM

## 2019-03-19 MED ORDER — AMOXICILLIN 500 MG PO TABS: 500.0000 mg | ORAL_TABLET | Freq: Two times a day (BID) | ORAL | 0 refills | Status: DC

## 2019-03-19 MED ORDER — CYANOCOBALAMIN 1000 MCG/ML IJ SOLN
1000.0000 ug | Freq: Once | INTRAMUSCULAR | Status: AC
  Administered 2019-03-19: 1000 ug via INTRAMUSCULAR

## 2019-03-19 NOTE — Telephone Encounter (Signed)
Pt would like to switch to Dr. Mortimer Fries from Dr. Patsey Berthold.  Rx for Amoxacillin 500mg  has been sent to preferred pharmacy. Pt is aware and voiced her understanding.

## 2019-03-19 NOTE — Telephone Encounter (Signed)
Oh ok.

## 2019-03-19 NOTE — Progress Notes (Signed)
  Anne [name] presents today for injection per MD orders. B12 injection  administered IM in left Upper Arm. Administration without incident. Patient tolerated well.  Gayna Braddy,cma

## 2019-03-19 NOTE — Telephone Encounter (Signed)
Ok to switch to Dr Duwayne Heck Amoxacillin 500 BID for 7 days

## 2019-03-25 NOTE — Telephone Encounter (Signed)
Have no problem with her switching however, would discourage switching back and forth as this does not offer good continuity of care.

## 2019-03-25 NOTE — Telephone Encounter (Signed)
Per Dr. Patsey Berthold verbally- okay to switch.  Recall has been placed for 94mo with Dr. Mortimer Fries. Nothing further is needed.

## 2019-04-16 ENCOUNTER — Telehealth: Payer: Self-pay | Admitting: *Deleted

## 2019-04-16 NOTE — Telephone Encounter (Signed)
-----   Message from Jerene Bears, MD sent at 04/15/2019 11:40 AM EST ----- Regarding: RE: EGD with Botox at [facility] - Dr. Hilarie Fredrickson patient on Plavix Yes it can wait and I have added DMNS to this note to add her to our wait list for hospital procedures once the restrictions are lifted Thanks JMP ----- Message ----- From: Lowell Guitar, RMA Sent: 04/15/2019  10:19 AM EST To: Jerene Bears, MD Subject: FW: EGD with Botox at [facility] - Dr. Hilarie Fredrickson patien#  Dr.Pyrtle, is this a case that can wait?  If so, I will contact patient to let her know the current situation with hospital cases.  Thanks, Peter Congo ----- Message ----- From: Larina Bras, CMA Sent: 04/08/2019   3:21 PM EST To: Lowell Guitar, RMA Subject: FW: EGD with Botox at [facility] - Dr. Hilarie Fredrickson patien#  Yes, you scheduled her, please tell her whatever needs to be told. Thx ----- Message ----- From: Lowell Guitar, RMA Sent: 04/08/2019   2:39 PM EST To: Larina Bras, CMA Subject: FW: EGD with Botox at [facility] - Dr. Hilarie Fredrickson patien#  Carla Drape, do I need to contact the patient regarding new guidelines regarding thisprocedure at [facility]? thanks ----- Message ----- From: Lowell Guitar, RMA Sent: 02/12/2019  11:35 AM EST To: Larina Bras, CMA, # Subject: EGD with Botox at [facility] - Dr. Hilarie Fredrickson patient on#  EGD w/Botox [facility] when schedule permits.  Pt is on Plavix managed by Dr. [name].  Dottie I know Dr. Hilarie Fredrickson doesn't have anything for December.  Could you please give me the heads up on when the January schedule is available.  thanks

## 2019-04-16 NOTE — Telephone Encounter (Signed)
Patient placed on hospital wait list. We will be back in touch with her as soon as we are allowed to reschedule at the hospital.

## 2019-04-17 ENCOUNTER — Telehealth: Payer: Self-pay

## 2019-04-17 ENCOUNTER — Telehealth: Payer: Self-pay | Admitting: Internal Medicine

## 2019-04-17 MED ORDER — PANTOPRAZOLE SODIUM 40 MG PO TBEC: 40.0000 mg | DELAYED_RELEASE_TABLET | Freq: Every day | ORAL | 1 refills | Status: DC

## 2019-04-17 NOTE — Telephone Encounter (Signed)
-----   Message from Jerene Bears, MD sent at 04/15/2019 11:40 AM EST ----- Regarding: RE: EGD with Botox at [facility] - Dr. Hilarie Fredrickson patient on Plavix Yes it can wait and I have added DMNS to this note to add her to our wait list for hospital procedures once the restrictions are lifted Thanks JMP ----- Message ----- From: Lowell Guitar, RMA Sent: 04/15/2019  10:19 AM EST To: Jerene Bears, MD Subject: FW: EGD with Botox at [facility] - Dr. Hilarie Fredrickson patien#  Dr.Pyrtle, is this a case that can wait?  If so, I will contact patient to let her know the current situation with hospital cases.  Thanks, Peter Congo ----- Message ----- From: Larina Bras, CMA Sent: 04/08/2019   3:21 PM EST To: Lowell Guitar, RMA Subject: FW: EGD with Botox at [facility] - Dr. Hilarie Fredrickson patien#  Yes, you scheduled her, please tell her whatever needs to be told. Thx ----- Message ----- From: Lowell Guitar, RMA Sent: 04/08/2019   2:39 PM EST To: Larina Bras, CMA Subject: FW: EGD with Botox at [facility] - Dr. Hilarie Fredrickson patien#  Carla Drape, do I need to contact the patient regarding new guidelines regarding thisprocedure at [facility]? thanks ----- Message ----- From: Lowell Guitar, RMA Sent: 02/12/2019  11:35 AM EST To: Larina Bras, CMA, # Subject: EGD with Botox at [facility] - Dr. Hilarie Fredrickson patient on#  EGD w/Botox [facility] when schedule permits.  Pt is on Plavix managed by Dr. [name].  Dottie I know Dr. Hilarie Fredrickson doesn't have anything for December.  Could you please give me the heads up on when the January schedule is available.  thanks

## 2019-04-17 NOTE — Telephone Encounter (Signed)
Rx sent 

## 2019-04-17 NOTE — Telephone Encounter (Signed)
Left message on patients voicemail to let her know that we will not be able to do her Procedure at [facility] due to [place] restrictions.   I told patient she will be placed on a waiting list and we will contact her when the restrictions are lifted.

## 2019-04-18 ENCOUNTER — Other Ambulatory Visit: Payer: Self-pay

## 2019-04-18 ENCOUNTER — Ambulatory Visit (INDEPENDENT_AMBULATORY_CARE_PROVIDER_SITE_OTHER): Payer: Medicare Other | Admitting: Family Medicine

## 2019-04-18 ENCOUNTER — Encounter: Payer: Self-pay | Admitting: Family Medicine

## 2019-04-18 VITALS — Ht 63.0 in | Wt 102.2 lb

## 2019-04-18 DIAGNOSIS — Z87892 Personal history of anaphylaxis: Secondary | ICD-10-CM

## 2019-04-18 DIAGNOSIS — J8410 Pulmonary fibrosis, unspecified: Secondary | ICD-10-CM | POA: Diagnosis not present

## 2019-04-18 DIAGNOSIS — Z1231 Encounter for screening mammogram for malignant neoplasm of breast: Secondary | ICD-10-CM

## 2019-04-18 DIAGNOSIS — N951 Menopausal and female climacteric states: Secondary | ICD-10-CM

## 2019-04-18 DIAGNOSIS — F419 Anxiety disorder, unspecified: Secondary | ICD-10-CM

## 2019-04-18 DIAGNOSIS — F5101 Primary insomnia: Secondary | ICD-10-CM

## 2019-04-18 DIAGNOSIS — N289 Disorder of kidney and ureter, unspecified: Secondary | ICD-10-CM

## 2019-04-18 DIAGNOSIS — K769 Liver disease, unspecified: Secondary | ICD-10-CM

## 2019-04-18 MED ORDER — ESTRADIOL 1 MG PO TABS: 1.0000 mg | ORAL_TABLET | Freq: Every day | ORAL | 1 refills | Status: DC

## 2019-04-18 MED ORDER — EPINEPHRINE 0.3 MG/0.3ML IJ SOAJ: 0.3000 mg | Freq: Once | INTRAMUSCULAR | 0 refills | Status: DC

## 2019-04-18 NOTE — Assessment & Plan Note (Signed)
" >>  ASSESSMENT AND PLAN FOR PULMONARY FIBROSIS ([facility]) WRITTEN ON 04/18/2019  9:32 AM BY Jerre Vandrunen G, MD  Relatively stable.  She is going to continue to follow with pulmonology. "

## 2019-04-18 NOTE — Assessment & Plan Note (Signed)
Patient has tried to taper off previously though had recurrence of symptoms.  She needs a refill of her estrogen.  She does report a family history of breast cancer.  I discussed the increased risk of breast cancer with taking this medication though she notes she does not want to have recurrence of symptoms.  Refill provided.  Mammogram ordered.

## 2019-04-18 NOTE — Assessment & Plan Note (Signed)
Does have some issues waking up in the middle the night.  Discussed decreasing screen time.  Offered medication to help sleep though she declines this at this time.

## 2019-04-18 NOTE — Assessment & Plan Note (Signed)
Relatively stable.  She is going to continue to follow with pulmonology.

## 2019-04-18 NOTE — Progress Notes (Signed)
Virtual Visit via video Note  This visit type was conducted due to national recommendations for restrictions regarding the COVID-19 pandemic (e.g. social distancing).  This format is felt to be most appropriate for this patient at this time.  All issues noted in this document were discussed and addressed.  No physical exam was performed (except for noted visual exam findings with Video Visits).   I connected with Anne [name] today at  9:00 AM EST by a video enabled telemedicine application and verified that I am speaking with the correct person using two identifiers. Location patient: home Location provider: work Persons participating in the virtual visit: patient, provider  I discussed the limitations, risks, security and privacy concerns of performing an evaluation and management service by telephone and the availability of in person appointments. I also discussed with the patient that there may be a patient responsible charge related to this service. The patient expressed understanding and agreed to proceed.  Reason for visit: follow-up  HPI: Renal lesion: Had follow-up on this.  It has enlarged slightly.  Concerning for renal cell carcinoma.  She notes urology wants to do cryoablation though they are going to follow-up with another MRI in 6 months.  Sleep difficulty: Patient falls asleep okay though wakes up after several hours of sleep.  She has a hard time going back to sleep with that.  Some stress though no anxiety or depression.  She has cut back on caffeine.  No alcohol intake.  She does look at a screen the hour prior to bed.  She tried melatonin.  Pulmonary fibrosis: She notes her breathing is relatively stable.  She gets some congestion with weather changes though overall doing okay.  No recent illnesses.  Liver lesion: Noted on MRI.  Has not grown.  Felt to likely be benign by radiology.  History of anaphylaxis: Patient is considering getting the COVID-19 vaccine.  She did  discuss this with her pulmonologist and they advised that she delay getting the vaccine for a while though the patient is considering getting this.   ROS: See pertinent positives and negatives per HPI.  Past Medical History:  Diagnosis Date   Allergic rhinitis    Allergy    multiple, mostly aspirin, levaquin and shellfish.   Anemia    Asthma    Cataract 2014   exraction with len implant   Complication of anesthesia    Breathing problems upon waking up. 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   Compressed cervical disc    COPD (chronic obstructive pulmonary disease) (HCC)    CVA (cerebral infarction)    Dyspnea    Esophageal dysmotility    Heart murmur    as child   History of hiatal hernia    IBS (irritable bowel syndrome)    Migraine    PICC (peripherally inserted central catheter) removal 02/20/2017   PONV (postoperative nausea and vomiting)    Problems with swallowing    intermittently   Pulmonary fibrosis ([place])    Shingles    Stroke ([place])    slurred speech, drawn face, imaging normal, occurred twice, [facility]-"TIA" if antything" 02/20/17-no residual effects   Tracheostomy in place [facility])    Vocal cord paresis     Past Surgical History:  Procedure Laterality Date   ABDOMINAL HYSTERECTOMY     APPLICATION OF A-CELL OF HEAD/NECK N/A 02/21/2017   Procedure: APPLICATION OF A-CELL OF HEAD/NECK;  Surgeon: Wallace Going, DO;  Location: [place];  Service: Plastics;  Laterality: N/A;   BOTOX INJECTION N/A 07/29/2013   Procedure: BOTOX INJECTION;  Surgeon: Jerene Bears, MD;  Location: [facility];  Service: Gastroenterology;  Laterality: N/A;   BOTOX INJECTION N/A 05/04/2015   Procedure: BOTOX INJECTION;  Surgeon: Jerene Bears, MD;  Location: [facility];  Service: Gastroenterology;  Laterality: N/A;   BOTOX INJECTION N/A 02/18/2018   Procedure: BOTOX INJECTION;  Surgeon: Jerene Bears, MD;  Location: [facility];  Service:  Gastroenterology;  Laterality: N/A;   BREAST BIOPSY Left    neg   BREAST SURGERY Left 2002   bx of skin   CHOLECYSTECTOMY     COLONOSCOPY     ESOPHAGEAL MANOMETRY N/A 12/16/2012   Procedure: ESOPHAGEAL MANOMETRY (EM);  Surgeon: Jerene Bears, MD;  [facility];  Service: Gastroenterology;  Laterality: N/A;   ESOPHAGOGASTRODUODENOSCOPY (EGD) WITH PROPOFOL N/A 07/29/2013   Procedure: ESOPHAGOGASTRODUODENOSCOPY (EGD) WITH PROPOFOL;  Surgeon: Jerene Bears, MD;  [facility];  Service: Gastroenterology;  Laterality: N/A;  with botox injection   ESOPHAGOGASTRODUODENOSCOPY (EGD) WITH PROPOFOL N/A 05/04/2015   Procedure: ESOPHAGOGASTRODUODENOSCOPY (EGD) WITH PROPOFOL;  Surgeon: Jerene Bears, MD;  [facility];  Service: Gastroenterology;  Laterality: N/A;   ESOPHAGOGASTRODUODENOSCOPY (EGD) WITH PROPOFOL N/A 02/18/2018   Procedure: ESOPHAGOGASTRODUODENOSCOPY (EGD) WITH PROPOFOL;  Surgeon: Jerene Bears, MD;  [facility];  Service: Gastroenterology;  Laterality: N/A;   EYE SURGERY     Catarct surgery 2014   Eye Surgery AS Child Left    INCISION AND DRAINAGE OF WOUND N/A 02/21/2017   Procedure: IRRIGATION AND DEBRIDEMENT WOUND NECK;  Surgeon: Wallace Going, DO;  Location: [place];  Service: Plastics;  Laterality: N/A;   JEJUNOSTOMY FEEDING TUBE     x2 both failed. no longer has   LUMBAR LAMINECTOMY/DECOMPRESSION MICRODISCECTOMY Bilateral 05/13/2018   Procedure: Laminectomy and Foraminotomy - Lumbar four-Lumbar five- bilateral;  Surgeon: Eustace Moore, MD;  Location: [place];  Service: Neurosurgery;  Laterality: Bilateral;   MULTIPLE TOOTH EXTRACTIONS     2 teeth removed   OOPHORECTOMY     POSTERIOR CERVICAL FUSION/FORAMINOTOMY N/A 01/10/2017   Procedure: LAMINECTOMY AND FORAMINOTOMY CERVICAL FOUR-CERVICAL FIVE, CERVICAL FIVE-SIX POSTERIOR CERVICAL INSTRUMENT FUSION CERVICAL THREE-CERVICAL SEVEN,CERVICAL LAMINECTOMY CERVICAL THREE-CERVICAL SEVEN.;   Surgeon: Eustace Moore, MD;  Location: [place];  Service: Neurosurgery;  Laterality: N/A;  posterior   TRACHEOSTOMY  1996   done at [facility], Dr. Kathyrn Sheriff   TUBAL LIGATION     VIDEO BRONCHOSCOPY Bilateral 11/20/2012   Procedure: VIDEO BRONCHOSCOPY WITH FLUORO;  Surgeon: Juanito Doom, MD;  Location[facility];  Service: Cardiopulmonary;  Laterality: Bilateral;    Family History  Problem Relation Age of Onset   Asthma Cousin    COPD Cousin    Breast cancer Maternal Grandmother 61   Asthma Father    Kidney cancer Father    Arrhythmia Father    Lung cancer Father    Lung cancer Paternal Uncle    COPD Paternal Grandfather    Alcohol abuse Mother    Breast cancer Maternal Aunt 15   Bladder Cancer Neg Hx    Colon cancer Neg Hx    Esophageal cancer Neg Hx    Pancreatic cancer Neg Hx    Stomach cancer Neg Hx    Liver disease Neg Hx     SOCIAL HX: Non-smoker   Current Outpatient Medications:    acetaminophen (TYLENOL) 500 MG tablet, Take 500-1,000 mg by mouth 2 (two) times daily as needed  for moderate pain or headache. , Disp: , Rfl:    cetirizine (ZYRTEC) 10 MG tablet, TAKE ONE TABLET BY MOUTH EVERY DAY, Disp: 30 tablet, Rfl: 5   clopidogrel (PLAVIX) 75 MG tablet, Take 1 tablet (75 mg total) by mouth daily., Disp: 90 tablet, Rfl: 3   diphenhydrAMINE (BENADRYL) 25 MG tablet, Take 25 mg by mouth daily as needed for allergies., Disp: , Rfl:    EPINEPHrine (EPIPEN 2-PAK) 0.3 mg/0.3 mL IJ SOAJ injection, Inject 0.3 mLs (0.3 mg total) into the muscle once for 1 dose., Disp: 0.6 mL, Rfl: 0   fluticasone (FLONASE) 50 MCG/ACT nasal spray, PLACE 2 SPRAYS INTO BOTH NOSTRILS DAILY. (Patient taking differently: Place 2 sprays into both nostrils daily as needed for allergies. ), Disp: 16 g, Rfl: 2   furosemide (LASIX) 20 MG tablet, TAKE ONE TABLET BY MOUTH EVERY DAY AS NEEDED, Disp: 90 tablet, Rfl: 1   ibuprofen (ADVIL,MOTRIN) 200 MG tablet, Take 400-800 mg by mouth  daily as needed for headache or moderate pain., Disp: , Rfl:    ipratropium-albuterol (DUONEB) 0.5-2.5 (3) MG/3ML SOLN, Take 3 mLs by nebulization every 4 (four) hours as needed. Dx 496 (Patient taking differently: Take 3 mLs by nebulization every 4 (four) hours as needed (wheezing or shortness of breath). Dx 496), Disp: 120 mL, Rfl: 2   metoprolol succinate (TOPROL XL) 25 MG 24 hr tablet, Take 0.5 tablets (12.5 mg total) by mouth daily., Disp: 90 tablet, Rfl: 3   montelukast (SINGULAIR) 10 MG tablet, TAKE ONE TABLET BY MOUTH DAILY BEDTIME, Disp: 90 tablet, Rfl: 1   mupirocin ointment (BACTROBAN) 2 %, Apply 1 application daily as needed topically (FOR [facility] SITE IRRITATION). , Disp: , Rfl:    Naphazoline-Pheniramine (OPCON-A) 0.027-0.315 % SOLN, Place 1 drop into both eyes daily as needed (for dry eyes). , Disp: , Rfl:    pantoprazole (PROTONIX) 40 MG tablet, Take 1 tablet (40 mg total) by mouth daily. NEEDS OFFICE VISIT FOR FURTHER REFILLS, Disp: 90 tablet, Rfl: 1   potassium chloride SA (K-DUR) 20 MEQ tablet, Take 2 tablets (40 mEq total) by mouth daily., Disp: 6 tablet, Rfl: 0   sodium chloride (OCEAN) 0.65 % SOLN nasal spray, Place 1 spray into both nostrils every 4 (four) hours as needed for congestion. , Disp: , Rfl:    sucralfate (CARAFATE) 1 g tablet, TAKE 1 TABLET BY MOUTH 4 TIMES DAILY WITH MEALS AND AT BEDTIME (Patient taking differently: as needed. ), Disp: 120 tablet, Rfl: 1   VENTOLIN HFA 108 (90 Base) MCG/ACT inhaler, INHALE 2 PUFFS INTO LUNGS EVERY 6 HOURS AS NEEDED FOR WHEEZING OR SHORTNESS OF BREATH, Disp: 18 g, Rfl: 1   White Petrolatum-Mineral Oil (LUBRICANT EYE) OINT, Place 1 application into both eyes 2 (two) times daily as needed (for dry eyes). , Disp: , Rfl:    estradiol (ESTRACE) 1 MG tablet, Take 1 tablet (1 mg total) by mouth daily., Disp: 90 tablet, Rfl: 1  EXAM:  VITALS per patient if applicable:  GENERAL: alert, oriented, appears well and in no acute  distress  HEENT: atraumatic, conjunttiva clear, no obvious abnormalities on inspection of external nose and ears  NECK: normal movements of the head and neck  LUNGS: on inspection no signs of respiratory distress, breathing rate appears normal, no obvious gross SOB, gasping or wheezing  CV: no obvious cyanosis  MS: moves all visible extremities without noticeable abnormality  PSYCH/NEURO: pleasant and cooperative, no obvious depression or anxiety, speech and thought processing  grossly intact  ASSESSMENT AND PLAN:  Discussed the following assessment and plan:  Menopausal symptom Patient has tried to taper off previously though had recurrence of symptoms.  She needs a refill of her estrogen.  She does report a family history of breast cancer.  I discussed the increased risk of breast cancer with taking this medication though she notes she does not want to have recurrence of symptoms.  Refill provided.  Mammogram ordered.  Insomnia Does have some issues waking up in the middle the night.  Discussed decreasing screen time.  Offered medication to help sleep though she declines this at this time.  History of anaphylaxis EpiPen is likely out of date.  This was refilled.  I discussed that if she decides to have the COVID-19 vaccine that she needs to let the people giving the vaccine know that she has had anaphylactic reactions to other things previously.  She needs to take her EpiPen with her.  She will need to be monitored for longer and when she leaves the vaccine site she would need to be with somebody for the next 24 hours for monitoring.  I advised that the only current contraindication is anaphylactic reaction to something that is in the current vaccine though there are guidelines on the risk to people who have had anaphylactic reactions to other things.  Pulmonary fibrosis ([place]) Relatively stable.  She is going to continue to follow with pulmonology.  Anxiety Asymptomatic.  She is now  off of Xanax.  Liver lesion Felt to likely be benign by radiology.  They will continue to monitor this on MRI through urology.  I encouraged her to continue to have this followed up on for now.  Kidney lesion She will continue to see her specialist at [facility].  I discussed that she would likely need a biopsy or treatment at some point.  She will have the MRI as planned.   Orders Placed This Encounter  Procedures   MM 3D SCREEN BREAST BILATERAL    Standing Status:   Future    Standing Expiration Date:   06/15/2020    Order Specific Question:   Reason for Exam (SYMPTOM  OR DIAGNOSIS REQUIRED)    Answer:   Breast cancer screening    Order Specific Question:   Preferred imaging location?    Answer:   [place] ordered this encounter  Medications   EPINEPHrine (EPIPEN 2-PAK) 0.3 mg/0.3 mL IJ SOAJ injection    Sig: Inject 0.3 mLs (0.3 mg total) into the muscle once for 1 dose.    Dispense:  0.6 mL    Refill:  0   estradiol (ESTRACE) 1 MG tablet    Sig: Take 1 tablet (1 mg total) by mouth daily.    Dispense:  90 tablet    Refill:  1    FOR NEXT FILL     I discussed the assessment and treatment plan with the patient. The patient was provided an opportunity to ask questions and all were answered. The patient agreed with the plan and demonstrated an understanding of the instructions.   The patient was advised to call back or seek an in-person evaluation if the symptoms worsen or if the condition fails to improve as anticipated.   Tommi Rumps, MD

## 2019-04-18 NOTE — Assessment & Plan Note (Signed)
She will continue to see her specialist at [facility].  I discussed that she would likely need a biopsy or treatment at some point.  She will have the MRI as planned.

## 2019-04-18 NOTE — Assessment & Plan Note (Signed)
Asymptomatic.  She is now off of Xanax.

## 2019-04-18 NOTE — Assessment & Plan Note (Signed)
EpiPen is likely out of date.  This was refilled.  I discussed that if she decides to have the COVID-19 vaccine that she needs to let the people giving the vaccine know that she has had anaphylactic reactions to other things previously.  She needs to take her EpiPen with her.  She will need to be monitored for longer and when she leaves the vaccine site she would need to be with somebody for the next 24 hours for monitoring.  I advised that the only current contraindication is anaphylactic reaction to something that is in the current vaccine though there are guidelines on the risk to people who have had anaphylactic reactions to other things.

## 2019-04-18 NOTE — Assessment & Plan Note (Signed)
Felt to likely be benign by radiology.  They will continue to monitor this on MRI through urology.  I encouraged her to continue to have this followed up on for now.

## 2019-04-21 ENCOUNTER — Other Ambulatory Visit: Payer: Self-pay

## 2019-04-21 ENCOUNTER — Telehealth: Payer: Self-pay | Admitting: Family Medicine

## 2019-04-21 NOTE — Telephone Encounter (Signed)
Pt states that she is having some burning after the end of urination. Pt states that she has AZO but wants a call back. Also pt states that she needs a new RX for EpiPen sent to pharmacy. Please advise.

## 2019-04-21 NOTE — Telephone Encounter (Signed)
Spoke with  Patient and advised refill called for Epi pen, advised ok to use AZO but that this will not cure a UTI just try increasing fluid sometimes urine becomes concentrated and can burn at the end , but if symptoms continue to be evaluated at [facility] or call back to office  Patient agreed.

## 2019-04-23 ENCOUNTER — Other Ambulatory Visit: Payer: Self-pay

## 2019-04-23 ENCOUNTER — Ambulatory Visit (INDEPENDENT_AMBULATORY_CARE_PROVIDER_SITE_OTHER): Payer: Medicare Other

## 2019-04-23 DIAGNOSIS — E538 Deficiency of other specified B group vitamins: Secondary | ICD-10-CM | POA: Diagnosis not present

## 2019-04-23 MED ORDER — CYANOCOBALAMIN 1000 MCG/ML IJ SOLN
1000.0000 ug | Freq: Once | INTRAMUSCULAR | Status: AC
  Administered 2019-04-23: 11:00:00 1000 ug via INTRAMUSCULAR

## 2019-04-23 MED ORDER — EPINEPHRINE 0.3 MG/0.3ML IJ SOAJ: 0.3000 mg | Freq: Once | INTRAMUSCULAR | 0 refills | Status: AC

## 2019-04-23 NOTE — Progress Notes (Addendum)
Pt presents today for b12 injection. Right arm, IM. Pt tolerated injection well.  Agree with plan. Mable Paris, NP

## 2019-04-24 ENCOUNTER — Ambulatory Visit (INDEPENDENT_AMBULATORY_CARE_PROVIDER_SITE_OTHER): Payer: Medicare Other | Admitting: Internal Medicine

## 2019-04-24 ENCOUNTER — Encounter: Payer: Self-pay | Admitting: Internal Medicine

## 2019-04-24 DIAGNOSIS — J069 Acute upper respiratory infection, unspecified: Secondary | ICD-10-CM

## 2019-04-24 MED ORDER — HYDROCODONE-HOMATROPINE 5-1.5 MG/5ML PO SYRP: 5.0000 mL | ORAL_SOLUTION | Freq: Every evening | ORAL | 0 refills | Status: DC | PRN
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
## 2019-04-24 NOTE — Assessment & Plan Note (Signed)
Typical sinus type symptoms---discussed likely viral, and despite no clear exposure, could very well be COVID. Discussed symptomatic Rx and will go ahead with narcotic cough syrup to help her sleep Gave her information about COVID testing and urgent care should she need to be seen --not clear about this so won't refer to respiratory clinic as yet

## 2019-05-19 ENCOUNTER — Other Ambulatory Visit: Payer: Self-pay | Admitting: *Deleted

## 2019-05-19 ENCOUNTER — Encounter: Payer: Self-pay | Admitting: Family Medicine

## 2019-05-19 MED ORDER — METOPROLOL SUCCINATE ER 25 MG PO TB24: 12.5000 mg | ORAL_TABLET | Freq: Every day | ORAL | 0 refills | Status: DC

## 2019-05-21 ENCOUNTER — Telehealth: Payer: Self-pay | Admitting: *Deleted

## 2019-05-21 ENCOUNTER — Other Ambulatory Visit: Payer: Self-pay | Admitting: Internal Medicine

## 2019-05-21 DIAGNOSIS — K224 Dyskinesia of esophagus: Secondary | ICD-10-CM

## 2019-05-21 DIAGNOSIS — K220 Achalasia of cardia: Secondary | ICD-10-CM

## 2019-05-21 DIAGNOSIS — Z7901 Long term (current) use of anticoagulants: Secondary | ICD-10-CM

## 2019-05-21 NOTE — Telephone Encounter (Signed)
Patient needs EGD with botox at [facility]. She is willing to have procedure on 06/30/19. I will have this arranged and call patient back. Also, we will contact Dr [name] regarding her Plavix to make sure she is ok to hold this 5 days prior to her procedure.

## 2019-05-21 NOTE — Telephone Encounter (Signed)
   Anne [name] 05/21/1950 [id number]  Dear Dr [name]:  We have scheduled the above named patient for a(n) endoscopy with botox procedure. Our records show that (s)he is on anticoagulation therapy.  Please advise as to whether the patient may come off their therapy of plavix 5 days prior to their procedure which is scheduled for 06/30/19.  Please route your response to [place], [place] or fax response to [phone].  Sincerely,    [facility]

## 2019-05-22 NOTE — Telephone Encounter (Signed)
Patient has been advised that per Dr [name], she may hold plavix 5 days prior to her endoscopy with botox on 06/30/19. She verbalizes clear understanding of this.

## 2019-05-22 NOTE — Telephone Encounter (Signed)
She is ok to stop the plavix 5 days prior to her procedure.

## 2019-05-22 NOTE — Telephone Encounter (Signed)
I have spoken with patient to give time/date/location and prep for upcoming endoscopy procedure at [facility]. I have also advised her regarding her need to hold plavix 5 days prior to her upcoming procedure. She verbalizes understanding of all instructions.

## 2019-05-26 ENCOUNTER — Telehealth: Payer: Self-pay | Admitting: Pulmonary Disease

## 2019-05-26 NOTE — Telephone Encounter (Signed)
Pt is aware of below recommendations and voiced her understanding. Nothing further is needed.  

## 2019-05-26 NOTE — Telephone Encounter (Signed)
Pt is aware of below recommendations and voiced her understanding.  Pt is questioning where this leaves her protection wise and if she will be able to be around family members.   Dr. Patsey Berthold please advise. Thanks

## 2019-05-26 NOTE — Telephone Encounter (Signed)
Seems like she had an unusually bad reaction and would recommend not taking the second dose.

## 2019-05-26 NOTE — Telephone Encounter (Signed)
Spoke to pt and verified below information.  Pt is aware that a message will sent to Dr. Patsey Berthold for recommendations.  Pt is questioning what her chances of being covered with only one dose and if Dr. Patsey Berthold agrees with PCP recommendations.   Dr. Patsey Berthold please see below message and advise. Thanks

## 2019-05-26 NOTE — Telephone Encounter (Signed)
Having the first shot will I did some protection.  Is not 95% but it is in the 80 to 85% on those people 70 years of age or older.  She should be okay to be around family as long as there is still following proper handwashing.

## 2019-05-26 NOTE — Telephone Encounter (Signed)
Pt is scheduled to receive the 2nd covid vaccine on 05/30/2019. She had her first dose on 05/09/2019 and had a reaction of neck tightness, wheezing, cough, and a rash at the injection site that was itchy and sore. Pt spoke with her PCP and him and their pharmacist agree that she should not take the 2nd vaccine due to the neck tightness. She would like another opinion from [facility], because she intended on getting the 2nd vaccine, but is kind of worried about getting it now due to what her PCP and his pharmacist told her. See MyChart note from 05/19/2019 between Anne [name] and Dr. [name]. Cb#: [phone] or [phone]

## 2019-05-27 ENCOUNTER — Telehealth: Payer: Self-pay | Admitting: Family Medicine

## 2019-05-27 ENCOUNTER — Other Ambulatory Visit: Payer: Self-pay | Admitting: Family Medicine

## 2019-05-27 MED ORDER — ZOLMITRIPTAN 5 MG PO TBDP: 5.0000 mg | ORAL_TABLET | Freq: Every day | ORAL | 0 refills | Status: DC | PRN

## 2019-05-27 NOTE — Telephone Encounter (Signed)
I called the patient and informed her that the medication was sent to the pharmacy for her headaches, she understood.  Grainger Mccarley,cma

## 2019-05-27 NOTE — Addendum Note (Signed)
Addended by: [name] on: 05/27/2019 01:33 PM   Modules accepted: Orders

## 2019-05-27 NOTE — Telephone Encounter (Signed)
Pt needs refill on Zomig? for migraines. She had a bad one yesterday and took an old rx from 2018. She states that she does not get them often but thought that maybe she should have some for when she does get one. Does she need an appt for this? Gill Delrossi,cma

## 2019-05-27 NOTE — Telephone Encounter (Signed)
I sent a refill in.

## 2019-05-27 NOTE — Telephone Encounter (Signed)
Please call the patient and make sure she ok with maxalt as an alternative to zomig. Her insurance prefers the [place]. Thanks.

## 2019-05-27 NOTE — Telephone Encounter (Signed)
Pt needs refill on Zomig? for migraines. She had a bad one yesterday and took an old rx from 2018. She states that she does not get them often but thought that maybe she should have some for when she does get one. Does she need an appt for this?

## 2019-05-28 ENCOUNTER — Other Ambulatory Visit: Payer: Self-pay

## 2019-05-28 ENCOUNTER — Ambulatory Visit (INDEPENDENT_AMBULATORY_CARE_PROVIDER_SITE_OTHER): Payer: Medicare Other

## 2019-05-28 DIAGNOSIS — E538 Deficiency of other specified B group vitamins: Secondary | ICD-10-CM | POA: Diagnosis not present

## 2019-05-28 MED ORDER — CYANOCOBALAMIN 1000 MCG/ML IJ SOLN
1000.0000 ug | Freq: Once | INTRAMUSCULAR | Status: AC
  Administered 2019-05-28: 1000 ug via INTRAMUSCULAR

## 2019-05-28 NOTE — Progress Notes (Signed)
Patient presented for B 12 injection to left deltoid, patient voiced no concerns nor showed any signs of distress during injection. 

## 2019-05-29 NOTE — Telephone Encounter (Signed)
I spoke with the patient and she is ok with the Maxalt change.  Faron Tudisco,cma

## 2019-06-19 ENCOUNTER — Encounter (HOSPITAL_COMMUNITY): Payer: Self-pay | Admitting: Internal Medicine

## 2019-06-21 ENCOUNTER — Other Ambulatory Visit: Payer: Self-pay | Admitting: Family Medicine

## 2019-06-23 NOTE — Telephone Encounter (Signed)
Refill request for rizatriptan, last seen 04-18-19, last filled 05-29-19.  Please advise.

## 2019-06-26 ENCOUNTER — Other Ambulatory Visit
Admission: RE | Admit: 2019-06-26 | Discharge: 2019-06-26 | Disposition: A | Discharge status: Home / Self Care | Payer: Medicare Other | Source: Ambulatory Visit | Attending: Internal Medicine | Admitting: Internal Medicine

## 2019-06-26 ENCOUNTER — Other Ambulatory Visit: Payer: Self-pay

## 2019-06-26 DIAGNOSIS — Z20822 Contact with and (suspected) exposure to covid-19: Secondary | ICD-10-CM | POA: Insufficient documentation

## 2019-06-26 DIAGNOSIS — Z01812 Encounter for preprocedural laboratory examination: Secondary | ICD-10-CM | POA: Diagnosis present

## 2019-06-26 LAB — SARS CORONAVIRUS 2 (TAT 6-24 HRS): SARS Coronavirus 2: NEGATIVE

## 2019-06-28 ENCOUNTER — Other Ambulatory Visit: Payer: Self-pay | Admitting: Family Medicine

## 2019-06-29 NOTE — Anesthesia Preprocedure Evaluation (Addendum)
Anesthesia Evaluation  Patient identified by MRN, date of birth, ID band Patient awake    Reviewed: Allergy & Precautions, NPO status , Patient's Chart, lab work & pertinent test results, reviewed documented beta blocker date and time   History of Anesthesia Complications (+) PONV and history of anesthetic complications  Airway Mallampati: Trach  TM Distance: >3 FB Neck ROM: Full    Dental  (+) Teeth Intact, Dental Advisory Given   Pulmonary asthma , COPD,    breath sounds clear to auscultation       Cardiovascular  Rhythm:Regular Rate:Normal     Neuro/Psych  Headaches, Anxiety CVA    GI/Hepatic Neg liver ROS, hiatal hernia, GERD  Medicated,  Endo/Other  negative endocrine ROS  Renal/GU negative Renal ROS     Musculoskeletal  (+) Arthritis ,   Abdominal Normal abdominal exam  (+)   Peds  Hematology   Anesthesia Other Findings   Reproductive/Obstetrics                             Anesthesia Physical  Anesthesia Plan  ASA: III  Anesthesia Plan: MAC   Post-op Pain Management:    Induction: Intravenous  PONV Risk Score and Plan: 4 or greater and Treatment may vary due to age or medical condition  Airway Management Planned: Tracheostomy  Additional Equipment: None  Intra-op Plan:   Post-operative Plan: Extubation in OR  Informed Consent: I have reviewed the patients History and Physical, chart, labs and discussed the procedure including the risks, benefits and alternatives for the proposed anesthesia with the patient or authorized representative who has indicated his/her understanding and acceptance.       Plan Discussed with: CRNA and Anesthesiologist  Anesthesia Plan Comments: (See PAT note written 05/08/2018 by Myra Gianotti, PA-C.   Has #4 cuffless trach. 5.5 used in 2018 neck surgeries.   Comments: 5.5 reinforced ett place through tracheostomy for back surgery)        Anesthesia Quick Evaluation

## 2019-06-30 ENCOUNTER — Ambulatory Visit (HOSPITAL_COMMUNITY): Payer: Medicare Other | Admitting: Anesthesiology

## 2019-06-30 ENCOUNTER — Ambulatory Visit (HOSPITAL_COMMUNITY)
Admission: RE | Admit: 2019-06-30 | Discharge: 2019-06-30 | Disposition: A | Discharge status: Home / Self Care | Payer: Medicare Other | Source: Ambulatory Visit | Attending: Internal Medicine | Admitting: Internal Medicine

## 2019-06-30 ENCOUNTER — Encounter (HOSPITAL_COMMUNITY)
Admission: RE | Disposition: A | Discharge status: Home / Self Care | Payer: Self-pay | Source: Ambulatory Visit | Attending: Internal Medicine

## 2019-06-30 ENCOUNTER — Other Ambulatory Visit: Payer: Self-pay

## 2019-06-30 ENCOUNTER — Encounter (HOSPITAL_COMMUNITY): Payer: Self-pay | Admitting: Internal Medicine

## 2019-06-30 DIAGNOSIS — Z881 Allergy status to other antibiotic agents status: Secondary | ICD-10-CM | POA: Diagnosis not present

## 2019-06-30 DIAGNOSIS — Z7902 Long term (current) use of antithrombotics/antiplatelets: Secondary | ICD-10-CM | POA: Diagnosis not present

## 2019-06-30 DIAGNOSIS — Z7901 Long term (current) use of anticoagulants: Secondary | ICD-10-CM

## 2019-06-30 DIAGNOSIS — Z91013 Allergy to seafood: Secondary | ICD-10-CM | POA: Diagnosis not present

## 2019-06-30 DIAGNOSIS — K220 Achalasia of cardia: Secondary | ICD-10-CM | POA: Insufficient documentation

## 2019-06-30 DIAGNOSIS — J449 Chronic obstructive pulmonary disease, unspecified: Secondary | ICD-10-CM | POA: Diagnosis not present

## 2019-06-30 DIAGNOSIS — Z8673 Personal history of transient ischemic attack (TIA), and cerebral infarction without residual deficits: Secondary | ICD-10-CM | POA: Insufficient documentation

## 2019-06-30 DIAGNOSIS — Z882 Allergy status to sulfonamides status: Secondary | ICD-10-CM | POA: Diagnosis not present

## 2019-06-30 DIAGNOSIS — Z886 Allergy status to analgesic agent status: Secondary | ICD-10-CM | POA: Insufficient documentation

## 2019-06-30 DIAGNOSIS — Z888 Allergy status to other drugs, medicaments and biological substances status: Secondary | ICD-10-CM | POA: Diagnosis not present

## 2019-06-30 DIAGNOSIS — K224 Dyskinesia of esophagus: Secondary | ICD-10-CM

## 2019-06-30 DIAGNOSIS — Z981 Arthrodesis status: Secondary | ICD-10-CM | POA: Insufficient documentation

## 2019-06-30 DIAGNOSIS — R1310 Dysphagia, unspecified: Secondary | ICD-10-CM | POA: Diagnosis present

## 2019-06-30 DIAGNOSIS — M1990 Unspecified osteoarthritis, unspecified site: Secondary | ICD-10-CM | POA: Diagnosis not present

## 2019-06-30 DIAGNOSIS — Z884 Allergy status to anesthetic agent status: Secondary | ICD-10-CM | POA: Diagnosis not present

## 2019-06-30 DIAGNOSIS — K219 Gastro-esophageal reflux disease without esophagitis: Secondary | ICD-10-CM | POA: Insufficient documentation

## 2019-06-30 HISTORY — PX: BOTOX INJECTION: SHX5754

## 2019-06-30 HISTORY — PX: ESOPHAGOGASTRODUODENOSCOPY (EGD) WITH PROPOFOL: SHX5813

## 2019-06-30 SURGERY — ESOPHAGOGASTRODUODENOSCOPY (EGD) WITH PROPOFOL
Anesthesia: Monitor Anesthesia Care

## 2019-06-30 MED ORDER — SODIUM CHLORIDE (PF) 0.9 % IJ SOLN
INTRAMUSCULAR | Status: DC | PRN
  Administered 2019-06-30: 09:00:00 4 mL via SUBMUCOSAL

## 2019-06-30 MED ORDER — PHENYLEPHRINE 40 MCG/ML (10ML) SYRINGE FOR IV PUSH (FOR BLOOD PRESSURE SUPPORT)
PREFILLED_SYRINGE | INTRAVENOUS | Status: DC | PRN
  Administered 2019-06-30: 80 ug via INTRAVENOUS

## 2019-06-30 MED ORDER — PROPOFOL 500 MG/50ML IV EMUL
INTRAVENOUS | Status: AC
  Filled 2019-06-30: qty 50

## 2019-06-30 MED ORDER — LACTATED RINGERS IV SOLN
INTRAVENOUS | Status: DC
  Administered 2019-06-30: 1000 mL via INTRAVENOUS

## 2019-06-30 MED ORDER — ONABOTULINUMTOXINA 100 UNITS IJ SOLR
INTRAMUSCULAR | Status: AC
  Filled 2019-06-30: qty 100

## 2019-06-30 MED ORDER — SODIUM CHLORIDE 0.9 % IV SOLN: INTRAVENOUS | Status: DC

## 2019-06-30 MED ORDER — PROPOFOL 10 MG/ML IV BOLUS
INTRAVENOUS | Status: DC | PRN
  Administered 2019-06-30: 30 mg via INTRAVENOUS
  Administered 2019-06-30 (×2): 20 mg via INTRAVENOUS

## 2019-06-30 MED ORDER — LIDOCAINE 2% (20 MG/ML) 5 ML SYRINGE
INTRAMUSCULAR | Status: DC | PRN
  Administered 2019-06-30: 40 mg via INTRAVENOUS

## 2019-06-30 MED ORDER — PROPOFOL 500 MG/50ML IV EMUL
INTRAVENOUS | Status: DC | PRN
  Administered 2019-06-30: 125 ug/kg/min via INTRAVENOUS

## 2019-06-30 SURGICAL SUPPLY — 14 items

## 2019-06-30 NOTE — H&P (Signed)
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  She was seen by Tye Savoy on 02/12/2019.  At that time her GERD was well controlled but she was starting to have recurrent dysphagia and requesting recurrent Botox injection.  This was scheduled for January but delayed due to the COVID-19 pandemic endoscopy restrictions.  She has had no change in symptoms those has intermittent issues with swallowing and vomiting.  These are the symptoms that Botox have tremendously improved over the last several years.  Last Botox injection was at EGD in November 2019.  She has been off Plavix for 5 days   She had 1 of 2 COVID-19 vaccinations, Pfizer, but due to reaction with some swelling in her throat she was advised not to get the second vaccine.  Past Medical History:  Diagnosis Date  . Allergic rhinitis   . Allergy    multiple, mostly aspirin, levaquin and shellfish.  . Anemia   . Asthma   . Cataract 2014   exraction with len implant  . Complication of anesthesia    Breathing problems upon waking up. 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  . Compressed cervical disc   . COPD (chronic obstructive pulmonary disease) ([place])   . CVA (cerebral infarction)   . Dyspnea   . Esophageal dysmotility   . Heart murmur    as child  . History of hiatal hernia   . IBS (irritable bowel syndrome)   . Migraine   . PICC (peripherally inserted central catheter) removal 02/20/2017  . PONV (postoperative nausea and vomiting)   . Problems with swallowing    intermittently  . Pulmonary fibrosis ([place])   . Shingles   . Stroke [facility])    slurred speech, drawn face, imaging normal, occurred twice, [facility]-"TIA" if antything" 02/20/17-no residual effects  . Tracheostomy in place [facility])   . Vocal cord paresis     Past  Surgical History:  Procedure Laterality Date  . ABDOMINAL HYSTERECTOMY    . APPLICATION OF A-CELL OF HEAD/NECK N/A 02/21/2017   Procedure: APPLICATION OF A-CELL OF HEAD/NECK;  Surgeon: Wallace Going, DO;  Location: [place];  Service: Plastics;  Laterality: N/A;  . BOTOX INJECTION N/A 07/29/2013   Procedure: BOTOX INJECTION;  Surgeon: Jerene Bears, MD;  Location[facility];  Service: Gastroenterology;  Laterality: N/A;  . BOTOX INJECTION N/A 05/04/2015   Procedure: BOTOX INJECTION;  Surgeon: Jerene Bears, MD;  Location[facility];  Service: Gastroenterology;  Laterality: N/A;  . BOTOX INJECTION N/A 02/18/2018   Procedure: BOTOX INJECTION;  Surgeon: Jerene Bears, MD;  Location[facility];  Service: Gastroenterology;  Laterality: N/A;  . BREAST BIOPSY Left    neg  . BREAST SURGERY Left 2002   bx of skin  . CHOLECYSTECTOMY    . COLONOSCOPY    . ESOPHAGEAL MANOMETRY N/A 12/16/2012   Procedure: ESOPHAGEAL MANOMETRY (EM);  Surgeon: Jerene Bears, MD;  [facility];  Service: Gastroenterology;  Laterality: N/A;  . ESOPHAGOGASTRODUODENOSCOPY (EGD) WITH PROPOFOL N/A 07/29/2013   Procedure: ESOPHAGOGASTRODUODENOSCOPY (EGD) WITH PROPOFOL;  Surgeon: Jerene Bears, MD;  [facility];  Service: Gastroenterology;  Laterality: N/A;  with botox injection  . ESOPHAGOGASTRODUODENOSCOPY (EGD) WITH PROPOFOL N/A 05/04/2015   Procedure: ESOPHAGOGASTRODUODENOSCOPY (EGD) WITH PROPOFOL;  Surgeon: Jerene Bears, MD;  [facility];  Service: Gastroenterology;  Laterality: N/A;  . ESOPHAGOGASTRODUODENOSCOPY (EGD) WITH PROPOFOL N/A 02/18/2018   Procedure: ESOPHAGOGASTRODUODENOSCOPY (EGD) WITH PROPOFOL;  Surgeon: Jerene Bears, MD;  [facility];  Service: Gastroenterology;  Laterality: N/A;  . EYE SURGERY     Catarct surgery 2014  . Eye Surgery AS Child Left   . INCISION AND DRAINAGE OF WOUND N/A 02/21/2017   Procedure: IRRIGATION AND DEBRIDEMENT WOUND NECK;  Surgeon:  Wallace Going, DO;  Location: [place];  Service: Plastics;  Laterality: N/A;  . JEJUNOSTOMY FEEDING TUBE     x2 both failed. no longer has  . LUMBAR LAMINECTOMY/DECOMPRESSION MICRODISCECTOMY Bilateral 05/13/2018   Procedure: Laminectomy and Foraminotomy - Lumbar four-Lumbar five- bilateral;  Surgeon: Eustace Moore, MD;  Location: [place];  Service: Neurosurgery;  Laterality: Bilateral;  . MULTIPLE TOOTH EXTRACTIONS     2 teeth removed  . OOPHORECTOMY    . POSTERIOR CERVICAL FUSION/FORAMINOTOMY N/A 01/10/2017   Procedure: LAMINECTOMY AND FORAMINOTOMY CERVICAL FOUR-CERVICAL FIVE, CERVICAL FIVE-SIX POSTERIOR CERVICAL INSTRUMENT FUSION CERVICAL THREE-CERVICAL SEVEN,CERVICAL LAMINECTOMY CERVICAL THREE-CERVICAL SEVEN.;  Surgeon: Eustace Moore, MD;  Location: [place];  Service: Neurosurgery;  Laterality: N/A;  posterior  . TRACHEOSTOMY  1996   done at [facility], Dr. Kathyrn Sheriff  . TUBAL LIGATION    . VIDEO BRONCHOSCOPY Bilateral 11/20/2012   Procedure: VIDEO BRONCHOSCOPY WITH FLUORO;  Surgeon: Juanito Doom, MD;  Location[facility];  Service: Cardiopulmonary;  Laterality: Bilateral;    (Not in an outpatient encounter)   Allergies  Allergen Reactions  . Asa [Aspirin] Shortness Of Breath, Swelling and Other (See Comments)    Tongue swells  . Bee Venom Anaphylaxis and Shortness Of Breath  . Epinephrine Shortness Of Breath and Palpitations    Occurs when pt is given double the dose, pt would use epipen as needed   . Feraheme [Ferumoxytol] Other (See Comments)    Throat squeezing  . Influenza Vaccines Shortness Of Breath and Other (See Comments)    Can take flu vaccines without eggs   . Quinolones Swelling and Other (See Comments)    Feet swell  . Shellfish Allergy Anaphylaxis  . Ciprofloxacin Swelling and Other (See Comments)    ALL MEDS ENDING IN -FLOXACIN MAKE THE FEET SWELL  . Clarithromycin Nausea Only  . Erythromycin Nausea Only  . Levaquin [Levofloxacin In D5w] Swelling and Other  (See Comments)    Feet and legs ache and SWELL  . Peanut-Containing Drug Products Other (See Comments)    Wheezing (boiled or raw peanuts)  . Septra [Sulfamethoxazole-Trimethoprim] Diarrhea  . Telithromycin Nausea Only  . Zanaflex [Tizanidine] Other (See Comments)    Caused the patient to feel "spaced out" and "not well"  . Adhesive [Tape] Rash and Other (See Comments)    Paper works tape works fine     Family History  Problem Relation Age of Onset  . Asthma Cousin   . COPD Cousin   . Breast cancer Maternal Grandmother 60  . Asthma Father   . Kidney cancer Father   . Arrhythmia Father   . Lung cancer Father   . Lung cancer Paternal Uncle   . COPD Paternal Grandfather   . Alcohol abuse Mother   . Breast cancer Maternal Aunt 69  . Bladder Cancer Neg Hx   . Colon cancer Neg Hx   . Esophageal cancer Neg Hx   . Pancreatic cancer Neg Hx   . Stomach cancer Neg Hx   . Liver disease Neg Hx     Social  History   Tobacco Use  . Smoking status: Never Smoker  . Smokeless tobacco: Never Used  Substance Use Topics  . Alcohol use: Yes    Alcohol/week: 0.0 standard drinks    Comment: occ.  . Drug use: No    ROS: As per history of present illness, otherwise negative  BP 137/60   Pulse 65   Temp 97.9 F (36.6 C) (Oral)   Resp 14   Ht 5' 3.5" (1.613 m)   Wt 46.7 kg   SpO2 100%   BMI 17.96 kg/m  Gen: awake, alert, NAD HEENT: anicteric, op clear, tracheostomy in place CV: RRR, no mrg Pulm: CTA b/l Abd: soft, NT/ND, +BS throughout Ext: no c/c/e Neuro: nonfocal   RELEVANT LABS AND IMAGING: CBC    Component Value Date/Time   WBC 5.2 01/14/2019 1012   RBC 4.01 01/14/2019 1012   HGB 13.1 01/14/2019 1012   HGB 12.6 02/17/2014 1303   HCT 38.0 01/14/2019 1012   HCT 37.9 02/17/2014 1303   PLT 184 01/14/2019 1012   PLT 208 02/17/2014 1303   MCV 94.8 01/14/2019 1012   MCV 97 02/17/2014 1303   MCH 32.7 01/14/2019 1012   MCHC 34.5 01/14/2019 1012   RDW 12.1 01/14/2019  1012   RDW 12.5 02/17/2014 1303   LYMPHSABS 1.4 01/14/2019 1012   LYMPHSABS 1.2 02/17/2014 1303   MONOABS 0.5 01/14/2019 1012   MONOABS 0.3 02/17/2014 1303   EOSABS 0.2 01/14/2019 1012   EOSABS 0.2 02/17/2014 1303   BASOSABS 0.1 01/14/2019 1012   BASOSABS 0.0 02/17/2014 1303    CMP     Component Value Date/Time   NA 140 10/31/2018 1035   K 3.5 11/05/2018 1122   K 3.6 04/03/2012 1653   CL 102 10/31/2018 1035   CO2 33 (H) 10/31/2018 1035   GLUCOSE 89 10/31/2018 1035   BUN 12 10/31/2018 1035   CREATININE 0.98 10/31/2018 1035   CREATININE 0.94 10/30/2016 1135   CALCIUM 9.5 10/31/2018 1035   PROT 6.5 10/31/2018 1035   ALBUMIN 4.1 10/31/2018 1035   AST 17 10/31/2018 1035   ALT 13 10/31/2018 1035   ALKPHOS 89 10/31/2018 1035   BILITOT 0.6 10/31/2018 1035   GFRNONAA 57 (L) 05/07/2018 1327   GFRAA >60 05/07/2018 1327    ASSESSMENT/PLAN: 69 year old female with a 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  Hypertensive LES --for Botox injection today at EGD. The nature of the procedure, as well as the risks, benefits, and alternatives were carefully and thoroughly reviewed with the patient. Ample time for discussion and questions allowed. The patient understood, was satisfied, and agreed to proceed.       Cc:No referring provider defined for this encounter.

## 2019-06-30 NOTE — Op Note (Addendum)
[facility] Patient Name: Anne [name] Procedure Date: 06/30/2019 MRN: [id number] Attending MD: Jerene Bears , MD Date of Birth: 1950-09-18 CSN: [id number] Age: 69 Admit Type: Outpatient Procedure:                Upper GI endoscopy Indications:              Dysphagia, For botulinum toxin injection of                            hypertensive LES (last Nov 2019 with significant                            improvement in swallowing dysfunction), controlled                            GERD Providers:                Lajuan Lines. Hilarie Fredrickson, MD, Glori Bickers, RN, Janeece Agee,                            Technician Referring MD:              Medicines:                Monitored Anesthesia Care Complications:            No immediate complications. Estimated Blood Loss:     Estimated blood loss: none. Procedure:                Pre-Anesthesia Assessment:                           - Prior to the procedure, a History and Physical                            was performed, and patient medications and                            allergies were reviewed. The patient's tolerance of                            previous anesthesia was also reviewed. The risks                            and benefits of the procedure and the sedation                            options and risks were discussed with the patient.                            All questions were answered, and informed consent                            was obtained. Prior Anticoagulants: The patient has                            taken Plavix (clopidogrel), last  dose was 5 days                            prior to procedure. ASA Grade Assessment: III - A                            patient with severe systemic disease. After                            reviewing the risks and benefits, the patient was                            deemed in satisfactory condition to undergo the                            procedure.                           After  obtaining informed consent, the endoscope was                            passed under direct vision. Throughout the                            procedure, the patient's blood pressure, pulse, and                            oxygen saturations were monitored continuously. The                            GIF-H190 [id number]) Olympus gastroscope was                            introduced through the mouth, and advanced to the                            second part of duodenum. The upper GI endoscopy was                            accomplished without difficulty. The patient                            tolerated the procedure well. Scope In: Scope Out: Findings:      The examined esophagus was normal. The lower esophageal sphincter was       successfully injected in four quadrants each with 25 units botulinum       toxin (total 100 units).      The entire examined stomach was normal.      The examined duodenum was normal. Impression:               - Normal esophagus. LEC injected with botulinum                            toxin.                           -  Normal stomach.                           - Normal examined duodenum.                           - No specimens collected. Moderate Sedation:      N/A Recommendation:           - Patient has a contact number available for                            emergencies. The signs and symptoms of potential                            delayed complications were discussed with the                            patient. Return to normal activities tomorrow.                            Written discharge instructions were provided to the                            patient.                           - Resume previous diet.                           - Continue present medications.                           - Resume Plavix (clopidogrel) at prior dose today.                            Refer to managing physician for further adjustment                            of  therapy.                           - Repeat upper endoscopy as needed for retreatment. Procedure Code(s):        --- Professional ---                           [phone], Esophagogastroduodenoscopy, flexible,                            transoral; with directed submucosal injection(s),                            any substance Diagnosis Code(s):        --- Professional ---                           R13.10, Dysphagia, unspecified  K22.0, Achalasia of cardia CPT copyright 2019 American Medical Association. All rights reserved. The codes documented in this report are preliminary and upon coder review may  be revised to meet current compliance requirements. Jerene Bears, MD 06/30/2019 8:42:50 AM This report has been signed electronically. Number of Addenda: 0

## 2019-06-30 NOTE — Transfer of Care (Signed)
Immediate Anesthesia Transfer of Care Note  Patient: Anne [name]  Procedure(s) Performed: ESOPHAGOGASTRODUODENOSCOPY (EGD) WITH PROPOFOL (N/A ) BOTOX INJECTION (N/A )  Patient Location: Endoscopy Unit  Anesthesia Type:MAC  Level of Consciousness: awake and patient cooperative  Airway & Oxygen Therapy: Patient Spontanous Breathing and Patient connected to face mask  Post-op Assessment: Report given to RN and Post -op Vital signs reviewed and stable  Post vital signs: Reviewed and stable  Last Vitals:  Vitals Value Taken Time  BP    Temp    Pulse    Resp    SpO2      Last Pain:  Vitals:   06/30/19 0744  TempSrc: Oral  PainSc: 0-No pain         Complications: No apparent anesthesia complications

## 2019-06-30 NOTE — Discharge Instructions (Signed)

## 2019-06-30 NOTE — Anesthesia Postprocedure Evaluation (Signed)
Anesthesia Post Note  Patient: SALIMA RUMER  Procedure(s) Performed: ESOPHAGOGASTRODUODENOSCOPY (EGD) WITH PROPOFOL (N/A ) BOTOX INJECTION (N/A )     Patient location during evaluation: PACU Anesthesia Type: MAC Level of consciousness: awake and alert Pain management: pain level controlled Vital Signs Assessment: post-procedure vital signs reviewed and stable Respiratory status: spontaneous breathing, nonlabored ventilation, respiratory function stable and patient connected to nasal cannula oxygen Cardiovascular status: stable and blood pressure returned to baseline Postop Assessment: no apparent nausea or vomiting Anesthetic complications: no    Last Vitals:  Vitals:   06/30/19 0844 06/30/19 0850  BP: (!) 112/44 (!) 113/36  Pulse: 60 62  Resp: 14 12  Temp: (!) 36.4 C   SpO2: 100% 100%    Last Pain:  Vitals:   06/30/19 0850  TempSrc:   PainSc: 0-No pain                 Allysha Tryon

## 2019-07-01 ENCOUNTER — Ambulatory Visit: Payer: Medicare Other

## 2019-07-03 ENCOUNTER — Ambulatory Visit (INDEPENDENT_AMBULATORY_CARE_PROVIDER_SITE_OTHER): Payer: Medicare Other

## 2019-07-03 ENCOUNTER — Other Ambulatory Visit: Payer: Self-pay

## 2019-07-03 DIAGNOSIS — E538 Deficiency of other specified B group vitamins: Secondary | ICD-10-CM | POA: Diagnosis not present

## 2019-07-03 MED ORDER — CYANOCOBALAMIN 1000 MCG/ML IJ SOLN
1000.0000 ug | Freq: Once | INTRAMUSCULAR | Status: AC
  Administered 2019-07-03: 16:00:00 1000 ug via INTRAMUSCULAR

## 2019-07-03 NOTE — Progress Notes (Cosign Needed Addendum)
Patient presented for B 12 injection to left deltoid, patient voiced no concerns nor showed any signs of distress during injection.  Reviewed.  Dr Scott 

## 2019-07-10 ENCOUNTER — Other Ambulatory Visit: Payer: Self-pay | Admitting: Cardiovascular Disease

## 2019-07-10 ENCOUNTER — Telehealth: Payer: Self-pay | Admitting: Internal Medicine

## 2019-07-10 NOTE — Telephone Encounter (Signed)
No answer, no VM

## 2019-07-10 NOTE — Telephone Encounter (Signed)
Please schedule office visit for refills. Thank you! 

## 2019-07-10 NOTE — Telephone Encounter (Signed)
See note below and advise. 

## 2019-07-14 ENCOUNTER — Other Ambulatory Visit: Payer: Self-pay

## 2019-07-14 MED ORDER — METOCLOPRAMIDE HCL 5 MG PO TABS: 5.0000 mg | ORAL_TABLET | Freq: Three times a day (TID) | ORAL | 0 refills | Status: DC | PRN

## 2019-07-14 NOTE — Telephone Encounter (Signed)
Pt sent mychart message to include gastroparesis diet, script sent to pharmacy.

## 2019-07-14 NOTE — Telephone Encounter (Signed)
Sounds like an element of gastroparesis, or slow stomach emptying and gastric accomodation (stretching) in response to eating Would try step 3 gastroparesis diet with smaller more freq meals.  I think this will improve slowly in the coming days to a few weeks Can try low dose metoclopramide prior to a meal if symptoms are severe, 5 mg TIDAC PRN (advise her of small change of muscle tremor/spasm with this medication).  She could even try half (2.5 mg) dose TIDPRN. If not improving she needs to let me know

## 2019-07-15 ENCOUNTER — Ambulatory Visit
Admission: RE | Admit: 2019-07-15 | Discharge: 2019-07-15 | Disposition: A | Discharge status: Home / Self Care | Payer: Medicare Other | Source: Ambulatory Visit | Attending: Family Medicine | Admitting: Family Medicine

## 2019-07-15 DIAGNOSIS — Z1231 Encounter for screening mammogram for malignant neoplasm of breast: Secondary | ICD-10-CM | POA: Diagnosis not present

## 2019-07-16 ENCOUNTER — Other Ambulatory Visit: Payer: Self-pay

## 2019-07-16 ENCOUNTER — Inpatient Hospital Stay: Payer: Medicare Other | Attending: Hematology and Oncology

## 2019-07-16 DIAGNOSIS — D509 Iron deficiency anemia, unspecified: Secondary | ICD-10-CM | POA: Diagnosis present

## 2019-07-16 LAB — IRON AND TIBC
Iron: 101 ug/dL (ref 28–170)
Saturation Ratios: 33 % — ABNORMAL HIGH (ref 10.4–31.8)
TIBC: 311 ug/dL (ref 250–450)
UIBC: 210 ug/dL

## 2019-07-16 LAB — CBC WITH DIFFERENTIAL/PLATELET
Abs Immature Granulocytes: 0.03 10*3/uL (ref 0.00–0.07)
Basophils Absolute: 0.1 10*3/uL (ref 0.0–0.1)
Basophils Relative: 1 %
Eosinophils Absolute: 0.2 10*3/uL (ref 0.0–0.5)
Eosinophils Relative: 4 %
HCT: 39.3 % (ref 36.0–46.0)
Hemoglobin: 13.4 g/dL (ref 12.0–15.0)
Immature Granulocytes: 1 %
Lymphocytes Relative: 25 %
Lymphs Abs: 1.4 10*3/uL (ref 0.7–4.0)
MCH: 32.5 pg (ref 26.0–34.0)
MCHC: 34.1 g/dL (ref 30.0–36.0)
MCV: 95.4 fL (ref 80.0–100.0)
Monocytes Absolute: 0.5 10*3/uL (ref 0.1–1.0)
Monocytes Relative: 9 %
Neutro Abs: 3.4 10*3/uL (ref 1.7–7.7)
Neutrophils Relative %: 60 %
Platelets: 235 10*3/uL (ref 150–400)
RBC: 4.12 MIL/uL (ref 3.87–5.11)
RDW: 12.3 % (ref 11.5–15.5)
WBC: 5.6 10*3/uL (ref 4.0–10.5)
nRBC: 0 % (ref 0.0–0.2)

## 2019-07-16 LAB — FERRITIN: Ferritin: 110 ng/mL (ref 11–307)

## 2019-07-17 ENCOUNTER — Telehealth: Payer: Self-pay

## 2019-07-17 NOTE — Telephone Encounter (Signed)
Attempted to return patient's call regarding lab results. With permission, Left VM stating labs were WNL. Number provided should patient have any further questions.

## 2019-07-17 NOTE — Telephone Encounter (Signed)
-----   Message from Secundino Ginger sent at 07/17/2019  1:07 PM EDT ----- Regarding: lab results Contact: [phone] Patient would like someone to call with lab results.

## 2019-07-18 ENCOUNTER — Other Ambulatory Visit: Payer: Self-pay | Admitting: Cardiovascular Disease

## 2019-07-18 ENCOUNTER — Other Ambulatory Visit: Payer: Self-pay

## 2019-07-18 MED ORDER — METOPROLOL SUCCINATE ER 25 MG PO TB24: 12.5000 mg | ORAL_TABLET | Freq: Every day | ORAL | 3 refills | Status: DC

## 2019-07-19 ENCOUNTER — Other Ambulatory Visit: Payer: Self-pay | Admitting: Cardiovascular Disease

## 2019-07-21 NOTE — Telephone Encounter (Signed)
Please schedule office visit. Last seen 09/2017. Thank you!

## 2019-07-21 NOTE — Telephone Encounter (Signed)
Patient calling in to state [facility] on [address] has still not received Rx even though we have a confirmation receipt. Please resend this medication or call pharmacy if able please (per patient)

## 2019-07-22 NOTE — Telephone Encounter (Signed)
Scheduled 4/28 w/ Gilford Rile

## 2019-07-23 ENCOUNTER — Ambulatory Visit (INDEPENDENT_AMBULATORY_CARE_PROVIDER_SITE_OTHER): Payer: Medicare Other | Admitting: Family

## 2019-07-23 ENCOUNTER — Encounter: Payer: Self-pay | Admitting: Family

## 2019-07-23 ENCOUNTER — Other Ambulatory Visit: Payer: Self-pay

## 2019-07-23 VITALS — BP 110/60 | HR 57 | Ht 63.0 in | Wt 103.5 lb

## 2019-07-23 DIAGNOSIS — R002 Palpitations: Secondary | ICD-10-CM

## 2019-07-23 DIAGNOSIS — R42 Dizziness and giddiness: Secondary | ICD-10-CM | POA: Diagnosis not present

## 2019-07-23 DIAGNOSIS — R0789 Other chest pain: Secondary | ICD-10-CM

## 2019-07-23 MED ORDER — METOPROLOL SUCCINATE ER 25 MG PO TB24: 12.5000 mg | ORAL_TABLET | Freq: Every day | ORAL | 3 refills | Status: DC

## 2019-07-23 NOTE — Patient Instructions (Signed)
Medication Instructions:  No medication changes today.   *If you need a refill on your cardiac medications before your next appointment, please call your pharmacy*  Lab Work: None ordered today.   Testing/Procedures: Your EKG today showed sinus bradycardia at 59 bpm which is a slow but regular heart rate.   Your previous heart monitor showed intermittent early beats which are not dangerous.   Follow-Up: At [facility], you and your health needs are our priority.  As part of our continuing mission to provide you with exceptional heart care, we have created designated Provider Care Teams.  These Care Teams include your primary Cardiologist (physician) and Advanced Practice Providers (APPs -  Physician Assistants and Nurse Practitioners) who all work together to provide you with the care you need, when you need it.  We recommend signing up for the patient portal called "MyChart".  Sign up information is provided on this After Visit Summary.  MyChart is used to connect with patients for Virtual Visits (Telemedicine).  Patients are able to view lab/test results, encounter notes, upcoming appointments, etc.  Non-urgent messages can be sent to your provider as well.   To learn more about what you can do with MyChart, go to [email or web address].    Your next appointment:   1 year(s)  The format for your next appointment:   In Person  Provider:    You may see Kathlyn Sacramento, MD or one of the following Advanced Practice Providers on your designated Care Team:    Murray Hodgkins, NP  Christell Faith, PA-C  Marrianne Mood, PA-C  Other Instructions   Palpitations Palpitations are feelings that your heartbeat is irregular or is faster than normal. It may feel like your heart is fluttering or skipping a beat. Palpitations are usually not a serious problem. They may be caused by many things, including smoking, caffeine, alcohol, stress, and certain medicines or drugs. Most causes of  palpitations are not serious. However, some palpitations can be a sign of a serious problem. You may need further tests to rule out serious medical problems. Follow these instructions at home:     Pay attention to any changes in your condition. Take these actions to help manage your symptoms: Eating and drinking  Avoid foods and drinks that may cause palpitations. These may include: ? Caffeinated coffee, tea, soft drinks, diet pills, and energy drinks. ? Chocolate. ? Alcohol. Lifestyle  Take steps to reduce your stress and anxiety. Things that can help you relax include: ? Yoga. ? Mind-body activities, such as deep breathing, meditation, or using words and images to create positive thoughts (guided imagery). ? Physical activity, such as swimming, jogging, or walking. Tell your health care provider if your palpitations increase with activity. If you have chest pain or shortness of breath with activity, do not continue the activity until you are seen by your health care provider. ? Biofeedback. This is a method that helps you learn to use your mind to control things in your body, such as your heartbeat.  Do not use drugs, including cocaine or ecstasy. Do not use marijuana.  Get plenty of rest and sleep. Keep a regular bed time. General instructions  Take over-the-counter and prescription medicines only as told by your health care provider.  Do not use any products that contain nicotine or tobacco, such as cigarettes and e-cigarettes. If you need help quitting, ask your health care provider.  Keep all follow-up visits as told by your health care provider. This is important. These  may include visits for further testing if palpitations do not go away or get worse. Contact a health care provider if you:  Continue to have a fast or irregular heartbeat after 24 hours.  Notice that your palpitations occur more often. Get help right away if you:  Have chest pain or shortness of  breath.  Have a severe headache.  Feel dizzy or you faint. Summary  Palpitations are feelings that your heartbeat is irregular or is faster than normal. It may feel like your heart is fluttering or skipping a beat.  Palpitations may be caused by many things, including smoking, caffeine, alcohol, stress, certain medicines, and drugs.  Although most causes of palpitations are not serious, some causes can be a sign of a serious medical problem.  Get help right away if you faint or have chest pain, shortness of breath, a severe headache, or dizziness. This information is not intended to replace advice given to you by your health care provider. Make sure you discuss any questions you have with your health care provider. Document Revised: 04/25/2017 Document Reviewed: 04/25/2017 Elsevier Patient Education  2020 [employer].

## 2019-07-23 NOTE — Progress Notes (Signed)
Office Visit    Patient Name: Anne [name] Date of Encounter: 07/23/2019  Primary Care Provider:  [name], MD Primary Cardiologist:  Kathlyn Sacramento, MD Electrophysiologist:  None   Chief Complaint    Anne [name] is a 69 y.o. female with a hx of palpitation, atypical chest pain with subsequent normal nuclear stress testing, TIA, anemia, asthma, chronic tracheostomy  presents today for follow-up of palpitations  Past Medical History    Past Medical History:  Diagnosis Date  . Allergic rhinitis   . Allergy    multiple, mostly aspirin, levaquin and shellfish.  . Anemia   . Asthma   . Cataract 2014   exraction with len implant  . Complication of anesthesia    Breathing problems upon waking up. 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  . Compressed cervical disc   . COPD (chronic obstructive pulmonary disease) ([place])   . CVA (cerebral infarction)   . Dyspnea   . Esophageal dysmotility   . Heart murmur    as child  . History of hiatal hernia   . IBS (irritable bowel syndrome)   . Migraine   . PICC (peripherally inserted central catheter) removal 02/20/2017  . PONV (postoperative nausea and vomiting)   . Problems with swallowing    intermittently  . Pulmonary fibrosis ([place])   . Shingles   . Stroke [facility])    slurred speech, drawn face, imaging normal, occurred twice, [facility]-"TIA" if antything" 02/20/17-no residual effects  . Tracheostomy in place [facility])   . Vocal cord paresis    Past Surgical History:  Procedure Laterality Date  . ABDOMINAL HYSTERECTOMY    . APPLICATION OF A-CELL OF HEAD/NECK N/A 02/21/2017   Procedure: APPLICATION OF A-CELL OF HEAD/NECK;  Surgeon: Wallace Going, DO;  Location: [place];  Service: Plastics;  Laterality: N/A;  . BOTOX INJECTION N/A 07/29/2013   Procedure: BOTOX INJECTION;  Surgeon: Jerene Bears, MD;  [facility];  Service: Gastroenterology;  Laterality: N/A;  . BOTOX INJECTION N/A  05/04/2015   Procedure: BOTOX INJECTION;  Surgeon: Jerene Bears, MD;  [facility];  Service: Gastroenterology;  Laterality: N/A;  . BOTOX INJECTION N/A 02/18/2018   Procedure: BOTOX INJECTION;  Surgeon: Jerene Bears, MD;  [facility];  Service: Gastroenterology;  Laterality: N/A;  . BOTOX INJECTION N/A 06/30/2019   Procedure: BOTOX INJECTION;  Surgeon: Jerene Bears, MD;  [facility];  Service: Gastroenterology;  Laterality: N/A;  . BREAST BIOPSY Left    neg  . BREAST SURGERY Left 2002   bx of skin  . CHOLECYSTECTOMY    . COLONOSCOPY    . ESOPHAGEAL MANOMETRY N/A 12/16/2012   Procedure: ESOPHAGEAL MANOMETRY (EM);  Surgeon: Jerene Bears, MD;  [facility];  Service: Gastroenterology;  Laterality: N/A;  . ESOPHAGOGASTRODUODENOSCOPY (EGD) WITH PROPOFOL N/A 07/29/2013   Procedure: ESOPHAGOGASTRODUODENOSCOPY (EGD) WITH PROPOFOL;  Surgeon: Jerene Bears, MD;  [facility];  Service: Gastroenterology;  Laterality: N/A;  with botox injection  . ESOPHAGOGASTRODUODENOSCOPY (EGD) WITH PROPOFOL N/A 05/04/2015   Procedure: ESOPHAGOGASTRODUODENOSCOPY (EGD) WITH PROPOFOL;  Surgeon: Jerene Bears, MD;  [facility];  Service: Gastroenterology;  Laterality: N/A;  . ESOPHAGOGASTRODUODENOSCOPY (EGD) WITH PROPOFOL N/A 02/18/2018   Procedure: ESOPHAGOGASTRODUODENOSCOPY (EGD) WITH PROPOFOL;  Surgeon: Jerene Bears, MD;  [facility];  Service: Gastroenterology;  Laterality: N/A;  . ESOPHAGOGASTRODUODENOSCOPY (EGD) WITH PROPOFOL N/A 06/30/2019   Procedure: ESOPHAGOGASTRODUODENOSCOPY (EGD) WITH PROPOFOL;  Surgeon:  Pyrtle, Lajuan Lines, MD;  Location: [facility];  Service: Gastroenterology;  Laterality: N/A;  . EYE SURGERY     Catarct surgery 2014  . Eye Surgery AS Child Left   . INCISION AND DRAINAGE OF WOUND N/A 02/21/2017   Procedure: IRRIGATION AND DEBRIDEMENT WOUND NECK;  Surgeon: Wallace Going, DO;  Location: [place];  Service: Plastics;   Laterality: N/A;  . JEJUNOSTOMY FEEDING TUBE     x2 both failed. no longer has  . LUMBAR LAMINECTOMY/DECOMPRESSION MICRODISCECTOMY Bilateral 05/13/2018   Procedure: Laminectomy and Foraminotomy - Lumbar four-Lumbar five- bilateral;  Surgeon: Eustace Moore, MD;  Location: [place];  Service: Neurosurgery;  Laterality: Bilateral;  . MULTIPLE TOOTH EXTRACTIONS     2 teeth removed  . OOPHORECTOMY    . POSTERIOR CERVICAL FUSION/FORAMINOTOMY N/A 01/10/2017   Procedure: LAMINECTOMY AND FORAMINOTOMY CERVICAL FOUR-CERVICAL FIVE, CERVICAL FIVE-SIX POSTERIOR CERVICAL INSTRUMENT FUSION CERVICAL THREE-CERVICAL SEVEN,CERVICAL LAMINECTOMY CERVICAL THREE-CERVICAL SEVEN.;  Surgeon: Eustace Moore, MD;  Location: [place];  Service: Neurosurgery;  Laterality: N/A;  posterior  . TRACHEOSTOMY  1996   done at [facility], Dr. Kathyrn Sheriff  . TUBAL LIGATION    . VIDEO BRONCHOSCOPY Bilateral 11/20/2012   Procedure: VIDEO BRONCHOSCOPY WITH FLUORO;  Surgeon: Juanito Doom, MD;  [facility];  Service: Cardiopulmonary;  Laterality: Bilateral;    Allergies  Allergies  Allergen Reactions  . Asa [Aspirin] Shortness Of Breath, Swelling and Other (See Comments)    Tongue swells  . Bee Venom Anaphylaxis and Shortness Of Breath  . Epinephrine Shortness Of Breath and Palpitations    Occurs when pt is given double the dose, pt would use epipen as needed   . Feraheme [Ferumoxytol] Other (See Comments)    Throat squeezing  . Influenza Vaccines Shortness Of Breath and Other (See Comments)    Can take flu vaccines without eggs   . Quinolones Swelling and Other (See Comments)    Feet swell  . Shellfish Allergy Anaphylaxis  . Ciprofloxacin Swelling and Other (See Comments)    ALL MEDS ENDING IN -FLOXACIN MAKE THE FEET SWELL  . Clarithromycin Nausea Only  . Erythromycin Nausea Only  . Levaquin [Levofloxacin In D5w] Swelling and Other (See Comments)    Feet and legs ache and SWELL  . Peanut-Containing Drug Products Other  (See Comments)    Wheezing (boiled or raw peanuts)  . Septra [Sulfamethoxazole-Trimethoprim] Diarrhea  . Telithromycin Nausea Only  . Zanaflex [Tizanidine] Other (See Comments)    Caused the patient to feel "spaced out" and "not well"  . Adhesive [Tape] Rash and Other (See Comments)    Paper works tape works fine     History of Present Illness    Anne [name] is a 69 y.o. female with a hx of palpitation, atypical chest pain with subsequent normal nuclear stress testing, TIA, anemia, asthma, chronic tracheostomy.  She was last seen in 2019 by Dr. Fletcher Anon for palpitations..  Seen initially by Dr. Fletcher Anon in 2015 for atypical chest pain.  Nuclear stress testing at the time with no evidence of ischemia.  Echo with normal LVEF and no significant valvular abnormalities.  She had repeat evaluation in 2019 for palpitations.  A monitor was worn which showed 1 short burst of SVT and infrequent PVC/PAC.  She was started on low-dose metoprolol.  She reports no lower extremity edema, orthopnea, PND. Tells me she has a little more chest pain than she used to. Tells me it varies. It occurs both at rest and  after activity. Happens a couple times per month. Lasts about 3-5 seconds and then goes away.  The symptoms are not very typical of angina in nature and we discussed this.  Notices palpitations more when she is still.  Palpitations feels similar to previous palpitations.  She tells me they might be occurring more often but she also thinks she may just be more aware of her heart.  She questions whether an increased dose of her metoprolol help.  We discussed her baseline low heart rate on her present dose of metoprolol and that an increased beta-blocker dose may make her feel worse.  Reports no new or worsening shortness of breath or dyspnea on exertion. Gets both lightheaded and dizziness intermittently. Tells me this has been a little bit more pronounced than previously.  Does notice it happens more with  position changes.  We discussed remaining adequately hydrated and she tells me she does try to do this.   EKGs/Labs/Other Studies Reviewed:   The following studies were reviewed today:   EKG:  EKG is ordered today.  The ekg ordered today demonstrates SB 57 bpm with no acute ST/T wave changes.   Recent Labs: 10/31/2018: ALT 13; BUN 12; Creatinine, Ser 0.98; Sodium 140 11/05/2018: Potassium 3.5 07/16/2019: Hemoglobin 13.4; Platelets 235  Recent Lipid Panel    Component Value Date/Time   CHOL 177 10/31/2018 1035   TRIG 74.0 10/31/2018 1035   HDL 77.90 10/31/2018 1035   CHOLHDL 2 10/31/2018 1035   VLDL 14.8 10/31/2018 1035   LDLCALC 84 10/31/2018 1035   LDLDIRECT 92.7 12/13/2012 1037    Home Medications   Current Meds  Medication Sig  . acetaminophen (TYLENOL) 500 MG tablet Take 500 mg by mouth 2 (two) times daily as needed for moderate pain or headache.   . cetirizine (ZYRTEC) 10 MG tablet TAKE 1 TABLET BY MOUTH EVERY DAY (NOT COVERED  . clopidogrel (PLAVIX) 75 MG tablet Take 1 tablet (75 mg total) by mouth daily.  . diphenhydrAMINE (BENADRYL) 25 MG tablet Take 25 mg by mouth daily as needed for allergies.  [name] EPINEPHrine (EPIPEN 2-PAK) 0.3 mg/0.3 mL IJ SOAJ injection Inject 0.3 mg into the muscle as needed for anaphylaxis.  [name] estradiol (ESTRACE) 1 MG tablet Take 1 tablet (1 mg total) by mouth daily. (Patient taking differently: Take 1 mg by mouth at bedtime. )  . fluticasone (FLONASE) 50 MCG/ACT nasal spray PLACE 2 SPRAYS INTO BOTH NOSTRILS DAILY. (Patient taking differently: Place 2 sprays into both nostrils daily as needed for allergies. )  . furosemide (LASIX) 20 MG tablet TAKE ONE TABLET BY MOUTH EVERY DAY AS NEEDED (Patient taking differently: Take 20 mg by mouth daily. )  . ibuprofen (ADVIL,MOTRIN) 200 MG tablet Take 400-800 mg by mouth daily as needed for headache or moderate pain.  [name] ipratropium-albuterol (DUONEB) 0.5-2.5 (3) MG/3ML SOLN Take 3 mLs by nebulization every 4  (four) hours as needed. Dx 496 (Patient taking differently: Take 3 mLs by nebulization every 4 (four) hours as needed (wheezing or shortness of breath). Dx 496)  . metoCLOPramide (REGLAN) 5 MG tablet Take 1 tablet (5 mg total) by mouth 3 (three) times daily with meals as needed for nausea.  . metoprolol succinate (TOPROL XL) 25 MG 24 hr tablet Take 0.5 tablets (12.5 mg total) by mouth daily.  . montelukast (SINGULAIR) 10 MG tablet TAKE ONE TABLET BY MOUTH DAILY BEDTIME (Patient taking differently: Take 10 mg by mouth at bedtime. )  . mupirocin ointment (BACTROBAN) 2 %  Apply 1 application daily as needed topically (FOR [facility] SITE IRRITATION).   . Naphazoline-Pheniramine (OPCON-A) 0.027-0.315 % SOLN Place 1 drop into both eyes daily as needed (allergies).   . pantoprazole (PROTONIX) 40 MG tablet Take 1 tablet (40 mg total) by mouth daily. NEEDS OFFICE VISIT FOR FURTHER REFILLS  . potassium chloride SA (K-DUR) 20 MEQ tablet Take 2 tablets (40 mEq total) by mouth daily. (Patient taking differently: Take 20 mEq by mouth daily as needed (low potassium). )  . rizatriptan (MAXALT-MLT) 10 MG disintegrating tablet TAKE 1 TABLET (10 MG TOTAL) BY MOUTH DAILY AS NEEDED FOR MIGRAINE. MAY REPEAT FOR ONE DOSE 2 HOURS AFTER THE FIRST ONE IF NEEDED.  . sodium chloride (OCEAN) 0.65 % SOLN nasal spray Place 1 spray into both nostrils every 4 (four) hours as needed for congestion.   . sucralfate (CARAFATE) 1 g tablet TAKE 1 TABLET BY MOUTH 4 TIMES DAILY WITH MEALS AND AT BEDTIME (Patient taking differently: Take 1 g by mouth 4 (four) times daily as needed (acid reflux). )  . VENTOLIN HFA 108 (90 Base) MCG/ACT inhaler INHALE 2 PUFFS INTO LUNGS EVERY 6 HOURS AS NEEDED FOR WHEEZING OR SHORTNESS OF BREATH (Patient taking differently: Inhale 2 puffs into the lungs every 6 (six) hours as needed for wheezing or shortness of breath. )  . White Petrolatum-Mineral Oil (LUBRICANT EYE) OINT Place 1 application into both eyes 2 (two)  times daily as needed (for dry eyes).   . [DISCONTINUED] metoprolol succinate (TOPROL XL) 25 MG 24 hr tablet Take 0.5 tablets (12.5 mg total) by mouth daily.  . [DISCONTINUED] zolmitriptan (ZOMIG-ZMT) 5 MG disintegrating tablet Take 1 tablet (5 mg total) by mouth daily as needed for migraine.      Review of Systems       Review of Systems  Constitution: Negative for chills, fever and malaise/fatigue.  Cardiovascular: Positive for chest pain and palpitations. Negative for dyspnea on exertion, leg swelling, near-syncope, orthopnea and syncope.  Respiratory: Negative for cough, shortness of breath and wheezing.   Gastrointestinal: Negative for nausea and vomiting.  Neurological: Positive for dizziness and light-headedness. Negative for weakness.   All other systems reviewed and are otherwise negative except as noted above.  Physical Exam    VS:  BP 110/60 (BP Location: Left Arm, Patient Position: Sitting, Cuff Size: Normal)   Pulse (!) 57   Ht 5\' 3"  (1.6 m)   Wt 103 lb 8 oz (46.9 kg)   SpO2 97%   BMI 18.33 kg/m  , BMI Body mass index is 18.33 kg/m. GEN: Well nourished, well developed, in no acute distress. HEENT: normal. Neck: Supple, no JVD, carotid bruits, or masses. Cardiac: RRR, no murmurs, rubs, or gallops. No clubbing, cyanosis, edema.  Radials/DP/PT 2+ and equal bilaterally.  Respiratory:  Respirations regular and unlabored, clear to auscultation bilaterally. Tracheostomy in place. GI: Soft, nontender, nondistended, BS + x 4. MS: No deformity or atrophy. Skin: Warm and dry, no rash. Neuro:  Strength and sensation are intact. Psych: Normal affect.  Assessment & Plan    1. Palpitations -known history of active 519.  Monitor 2019 with/single short run of SVT and infrequent PVCs with PAC.  Echo at that time with normal LVEF and no significant valvular abnormalities.  Palpitations have been overall stable.  Continue metoprolol 12.5 mg.  Recommend avoiding caffeine, alcohol,  limiting stress.  We discussed over-the-counter proarrhythmic medications and she does not take any of these.  We discussed that an increased dose of  metoprolol with not likely be beneficial due to her baseline bradycardia. 2. Lightheadedness/dizziness -reports intermittent symptoms.  Symptoms consistent with orthostatic hypotension as they are primarily exacerbated by position changes.  Discussed remaining adequately hydrated and taking position changes slowly.  No indication for further testing at this time. 3. Atypical chest pain -reports infrequent chest pain a few times per month the last 3 to 5 seconds and occurs both at rest and with activity.  Tells me it is a fleeting sensation.  We discussed the atypical nature of this chest pain.  No indication for ischemic evaluation at this time.  EKG today with no acute ST/T wave changes.  She was educated to report new or worsening chest pain.  Disposition: Follow up in 1 year(s) with Dr. Fletcher Anon or APP.    Loel Dubonnet, NP 07/23/2019, 9:11 PM

## 2019-08-05 ENCOUNTER — Other Ambulatory Visit: Payer: Self-pay

## 2019-08-05 ENCOUNTER — Ambulatory Visit (INDEPENDENT_AMBULATORY_CARE_PROVIDER_SITE_OTHER): Payer: Medicare Other

## 2019-08-05 ENCOUNTER — Telehealth: Payer: Self-pay | Admitting: Pulmonary Disease

## 2019-08-05 DIAGNOSIS — E538 Deficiency of other specified B group vitamins: Secondary | ICD-10-CM

## 2019-08-05 MED ORDER — AZITHROMYCIN 250 MG PO TABS: ORAL_TABLET | ORAL | 0 refills | Status: DC

## 2019-08-05 MED ORDER — CYANOCOBALAMIN 1000 MCG/ML IJ SOLN
1000.0000 ug | Freq: Once | INTRAMUSCULAR | Status: AC
  Administered 2019-08-05: 1000 ug via INTRAMUSCULAR

## 2019-08-05 MED ORDER — PREDNISONE 20 MG PO TABS: 20.0000 mg | ORAL_TABLET | Freq: Every day | ORAL | 0 refills | Status: DC

## 2019-08-05 NOTE — Telephone Encounter (Signed)
Called spoke with patient from calling office back.  She states she has been coughing has clear-yellow mucus she believes she is in an asthma flare up gradually getting worse, but she is not sleeping and coughing all night.   Dr. Mortimer Fries please advise on prednisone and cough syrup for patient.

## 2019-08-05 NOTE — Telephone Encounter (Signed)
Prednisone 20 mg daily for 7 days Z pak Robitussin DM 5 ml every 4 hrs as needed

## 2019-08-05 NOTE — Progress Notes (Addendum)
Patient presented for B 12 injection to left deltoid, patient voiced no concerns nor showed any signs of distress during injection.  Agree  Dunn Loring

## 2019-08-05 NOTE — Telephone Encounter (Signed)
Called spoke with patient let her know Dr. Zoila Shutter recommendations.  Orders placed Nothing further needed at this time

## 2019-08-05 NOTE — Telephone Encounter (Signed)
Pt states that she is having some drainage, she thinks it's related to the pollen. It started 07/29/2019. She wanted to see if DG would prescribe her some prednisone and cough syrup. Her preferred pharmacy is [facility] on [address] in [place], [place]. She would like a call back with an update at [phone]

## 2019-08-05 NOTE — Telephone Encounter (Signed)
ATC patient unable to reach, unable to leave VM

## 2019-08-19 ENCOUNTER — Telehealth: Payer: Self-pay | Admitting: Family Medicine

## 2019-08-19 ENCOUNTER — Ambulatory Visit (INDEPENDENT_AMBULATORY_CARE_PROVIDER_SITE_OTHER): Payer: Medicare Other | Admitting: Family Medicine

## 2019-08-19 ENCOUNTER — Encounter: Payer: Self-pay | Admitting: Family Medicine

## 2019-08-19 ENCOUNTER — Other Ambulatory Visit: Payer: Self-pay

## 2019-08-19 DIAGNOSIS — J8410 Pulmonary fibrosis, unspecified: Secondary | ICD-10-CM | POA: Diagnosis not present

## 2019-08-19 DIAGNOSIS — K220 Achalasia of cardia: Secondary | ICD-10-CM

## 2019-08-19 DIAGNOSIS — Z8673 Personal history of transient ischemic attack (TIA), and cerebral infarction without residual deficits: Secondary | ICD-10-CM

## 2019-08-19 NOTE — Assessment & Plan Note (Signed)
She will continue to see GI.  Discussed continuing with dietary changes.  Continue Carafate and Protonix.

## 2019-08-19 NOTE — Telephone Encounter (Signed)
-----   Message from De Hollingshead, [facility] sent at 08/19/2019 12:19 PM EDT ----- Marykay Lex,   Looks like it may not be a contraindication, but there is still potential for crossreactivity.   "People with a contraindication to one type of the currently authorized COVID-19 vaccines (e.g., mRNA) have a precaution to the other (e.g., Janssen viral vector). However, because of potential cross-reactive hypersensitivity between ingredients in mRNA and Janssen COVID-19 vaccines, consultation with an allergist-immunologist should be considered to help determine if the patient can safely receive vaccination." Could consider referral to allergist for more advice? ----- Message ----- From: [name], MD Sent: 08/19/2019  10:38 AM EDT To: De Hollingshead, [place],   This patient had an anaphylactic reaction to the [place] vaccine. Would she be able to have the J&J vaccine? Or would the anaphylactic reaction be a contraindication? Thanks.   Randall Hiss

## 2019-08-19 NOTE — Assessment & Plan Note (Signed)
Chronic issue.  Discussed she needs to contact her surgeon.  She can continue Tylenol as needed.

## 2019-08-19 NOTE — Telephone Encounter (Signed)
Patient understood and stated she does not need a referral her allergist Dr. Olevia Bowens is a friend of the family and her allergist and she will call him and speak to him about it.  Nina,cma

## 2019-08-19 NOTE — Progress Notes (Signed)
Tommi Rumps, MD Phone: [phone]  Anne [name] is a 69 y.o. female who presents today for follow-up.  Pulmonary fibrosis/asthma: No she required prednisone 2 weeks ago with the pollen and weather changes though she notes she is feeling better now.  She sees pulmonology in the next month or so.  Chronic back pain: Related to scoliosis.  She notes she had a fall about 4 weeks ago.  No specific injury with that though her low back has been bothering her little more since then.  She has pain radiating to both legs which is chronic.  Her left leg is intermittently numb which is chronic as well.  Feels that her legs are weak at times.  No incontinence.  Taking Tylenol for her back.  She does follow with a neurosurgeon.  GI issues: Recently had a normal EGD with Botox injections.  That does help her swallow.  She notes slow processing times when she is eating and that causes some abdominal discomfort.  She has seen GI for this and they have given her specific diets to try.  They also prescribed Reglan which did not help much.  She tried cutting the cough soda.  She does not have an ulcer.  She is on Carafate which does help some and Protonix.  History of TIA: Currently on Plavix.  No recurrent stroke or TIA symptoms.  Social History   Tobacco Use  Smoking Status Never Smoker  Smokeless Tobacco Never Used     ROS see history of present illness  Objective  Physical Exam Vitals:   08/19/19 1026  BP: 110/80  Pulse: 61  Temp: (!) 96.7 F (35.9 C)  SpO2: 99%    BP Readings from Last 3 Encounters:  08/19/19 110/80  07/23/19 110/60  06/30/19 (!) 112/99   Wt Readings from Last 3 Encounters:  08/19/19 104 lb 12.8 oz (47.5 kg)  07/23/19 103 lb 8 oz (46.9 kg)  06/30/19 103 lb (46.7 kg)    Physical Exam Constitutional:      General: She is not in acute distress.    Appearance: She is not diaphoretic.  Cardiovascular:     Rate and Rhythm: Normal rate and regular rhythm.   Heart sounds: Normal heart sounds.  Pulmonary:     Effort: Pulmonary effort is normal.     Breath sounds: Normal breath sounds.  Musculoskeletal:     Comments: No midline spine tenderness, no midline spine step-off, there is muscular tenderness in her low back  Skin:    General: Skin is warm and dry.  Neurological:     Mental Status: She is alert.     Comments: 5/5 strength bilateral quads, hamstrings, plantar flexion, and dorsiflexion, sensation light touch intact bilateral lower extremities      Assessment/Plan: Please see individual problem list.  Pulmonary fibrosis (HCC) Stable lung sounds.  Doing better after prednisone.  She will see pulmonology as planned.  Achalasia of esophagus She will continue to see GI.  Discussed continuing with dietary changes.  Continue Carafate and Protonix.  Low back pain Chronic issue.  Discussed she needs to contact her surgeon.  She can continue Tylenol as needed.  History of TIA (transient ischemic attack) No recurrence.  She will continue Plavix.   Health Maintenance: I will check with our clinical pharmacist to see if the patient would be able to get the [place] vaccine given that she had an anaphylactic reaction to [employer] Covid vaccine.  No orders of the  defined types were placed in this encounter.   No orders of the defined types were placed in this encounter.   This visit occurred during the SARS-CoV-2 public health emergency.  Safety protocols were in place, including screening questions prior to the visit, additional usage of staff PPE, and extensive cleaning of exam room while observing appropriate contact time as indicated for disinfecting solutions.    I have spent 32 minutes in the care of this patient regarding history taking, discussion of medical problems, physical exam, documentation, and discussion of Covid vaccines.   Tommi Rumps, MD [place]

## 2019-08-19 NOTE — Telephone Encounter (Signed)
Please let the patient know I heard back from the pharmacist. She noted that there is not a contraindication for the patient to get the johnson and johnson COVID vaccine, though there is the potential for cross reactivity and she and I would recommend that the patient see an allergist to help determine if she can safely get the J&J vaccine. I can refer her once you speak with her.

## 2019-08-19 NOTE — Assessment & Plan Note (Signed)
" >>  ASSESSMENT AND PLAN FOR PULMONARY FIBROSIS (HCC) WRITTEN ON 08/19/2019 10:44 AM BY Jaila Schellhorn G, MD  Stable lung sounds.  Doing better after prednisone .  She will see pulmonology as planned. "

## 2019-08-19 NOTE — Patient Instructions (Signed)
Nice to see you. We will check on the J&J covid vaccine for you. Please call Dr Adah Salvage office for an appointment for your back.

## 2019-08-19 NOTE — Assessment & Plan Note (Signed)
No recurrence.  She will continue Plavix.

## 2019-08-19 NOTE — Assessment & Plan Note (Signed)
Stable lung sounds.  Doing better after prednisone.  She will see pulmonology as planned.

## 2019-08-21 ENCOUNTER — Other Ambulatory Visit: Payer: Self-pay | Admitting: Internal Medicine

## 2019-08-22 ENCOUNTER — Telehealth: Payer: Self-pay

## 2019-08-22 NOTE — Telephone Encounter (Signed)
I called the patient and she stated she spoke with a nurse and she informed her to ask the provider about the J & J vaccine but she does not know if the message got to him because they did not respond. I informed her that I would call har back after you here from the pharmacist.  Anne [name]

## 2019-08-22 NOTE — Telephone Encounter (Signed)
Noted. I personally do not think she should get the second pfizer vaccine based on the CDC recommendations for people with anaphylactic or immediate allergic reactions. Please see if they discussed the J&J vaccine.  FYI to Catie - please see the allergists response

## 2019-08-26 NOTE — Telephone Encounter (Signed)
She really should see a allergist/immunologist in person to discuss this.

## 2019-08-26 NOTE — Telephone Encounter (Signed)
I called the patient and informed her that the provider stated she needed to discuss the allergic reaction of the vaccine with the allergist in person and she did agree and stated she would call and schedule.  Nina,cma

## 2019-08-31 ENCOUNTER — Other Ambulatory Visit: Payer: Self-pay | Admitting: Internal Medicine

## 2019-09-01 ENCOUNTER — Other Ambulatory Visit: Payer: Self-pay | Admitting: Physical Medicine and Rehabilitation

## 2019-09-01 DIAGNOSIS — M5442 Lumbago with sciatica, left side: Secondary | ICD-10-CM

## 2019-09-03 IMAGING — MR MR CERVICAL SPINE W/O CM
4 of 5 series · 18 of 48 positions shown · non-contrast
Comparison: Cervical spine MRI 01/03/2017

Cervical spine fluoroscopy 01/10/2017

CLINICAL DATA: Postoperative right-sided weakness following
posterior spinal fusion.

EXAM:
MRI CERVICAL SPINE WITHOUT CONTRAST
TECHNIQUE: Multiplanar, multisequence MR imaging of the cervical spine was
performed. No intravenous contrast was administered.

[Series 2: T2 · sagittal · 3.0mm · 0.43mm/px · 6 of 15 slices shown (1 of 2)]
[im 1/15]
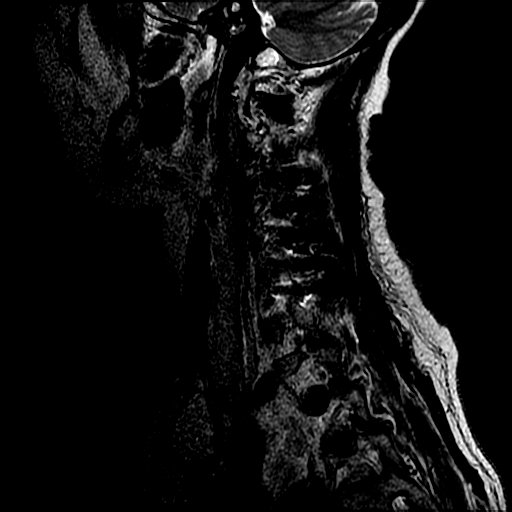
[im 3/15]
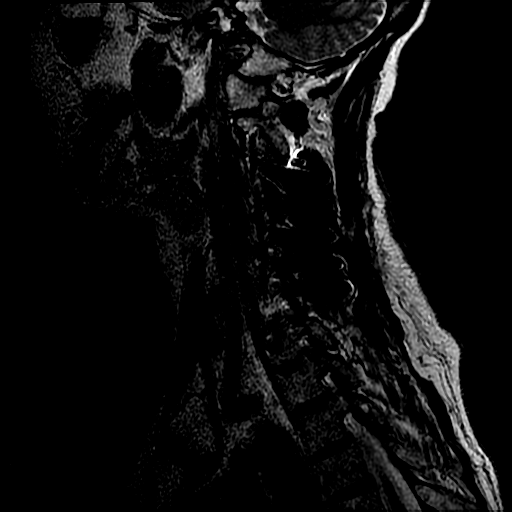
[im 6/15]
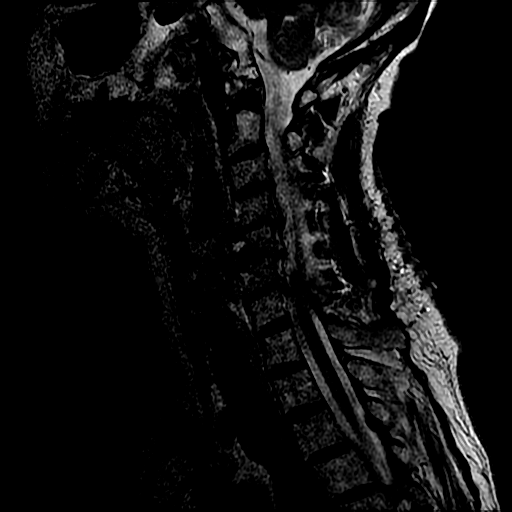
[im 9/15]
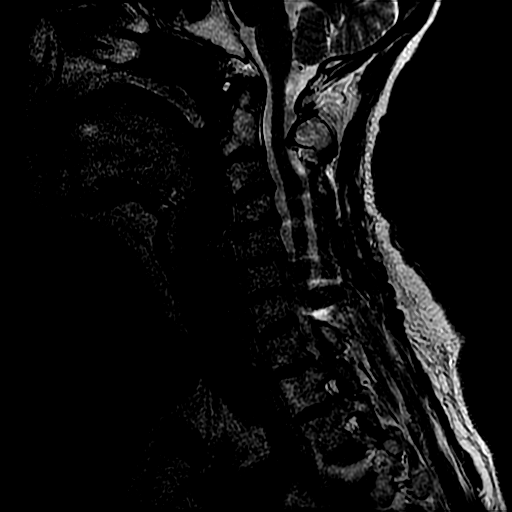
[im 12/15]
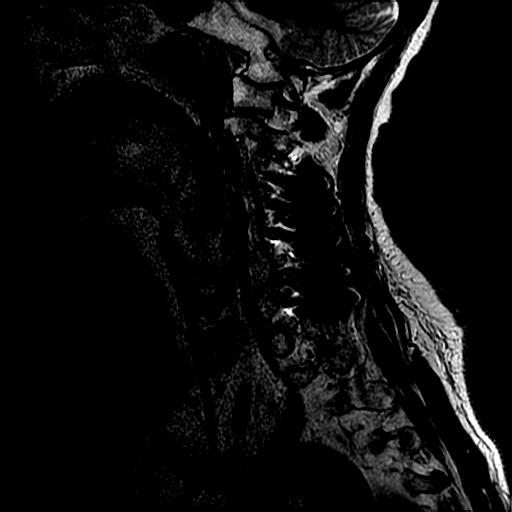
[im 15/15]
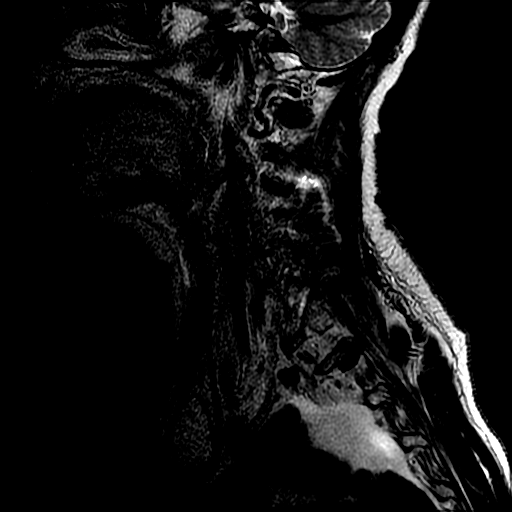

[Series 3: T1 · sagittal · 3.0mm · 0.43mm/px · 3 of 15 slices shown]
[im 3/15]
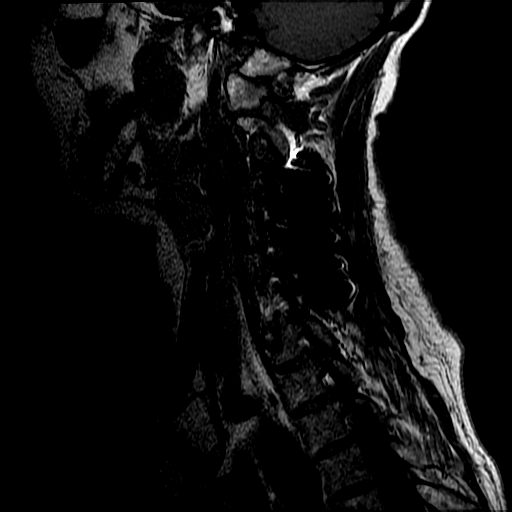
[im 8/15]
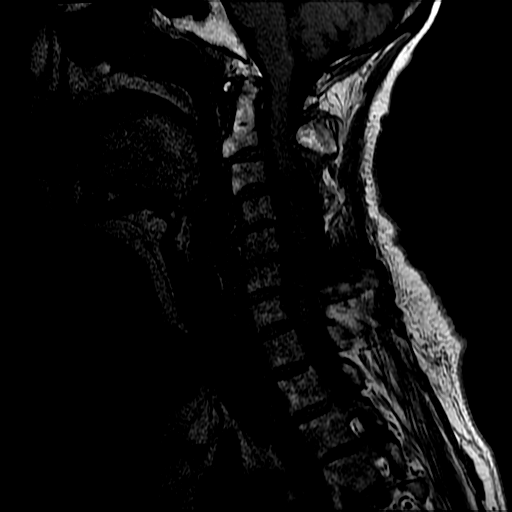
[im 12/15]
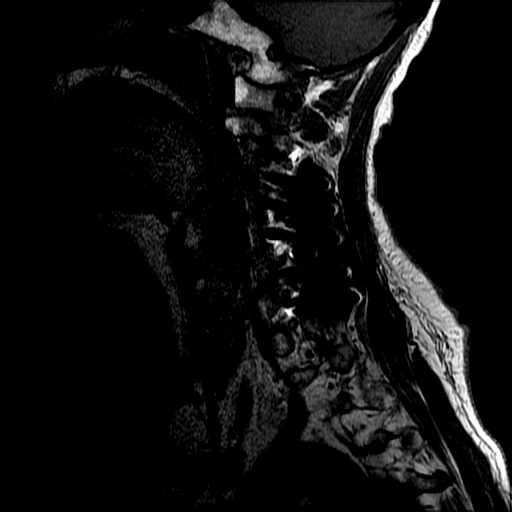

[Series 4: sag ir · sagittal · 3.0mm · 0.43mm/px · 3 of 15 slices shown]
[im 3/15]
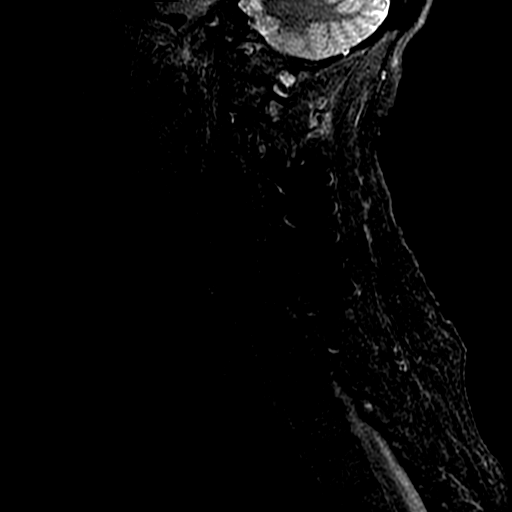
[im 8/15]
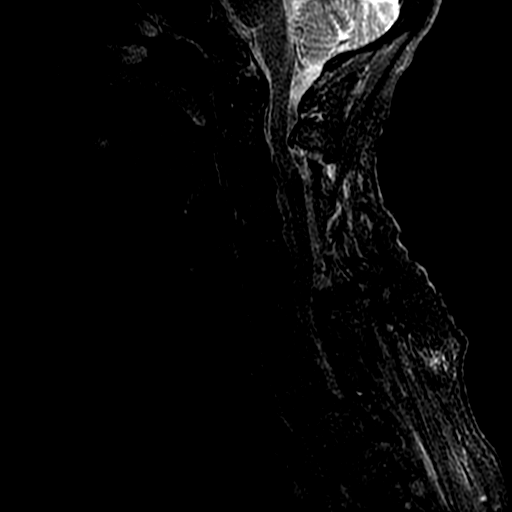
[im 12/15]
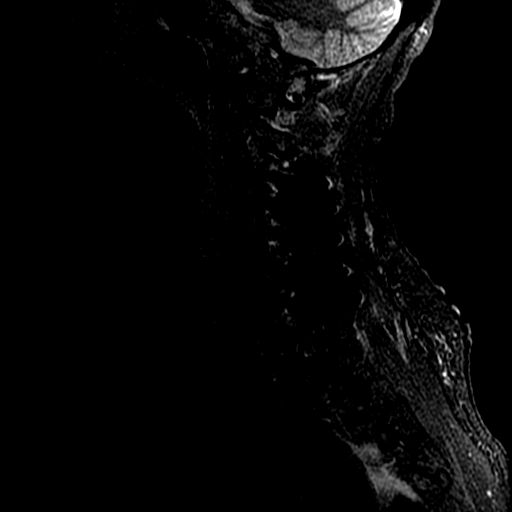

[Series 5: T2 · axial · 3.0mm · 0.39mm/px · z∈[-86,-2]mm · 6 of 32 slices shown (2 of 2)]
[im 1/32]
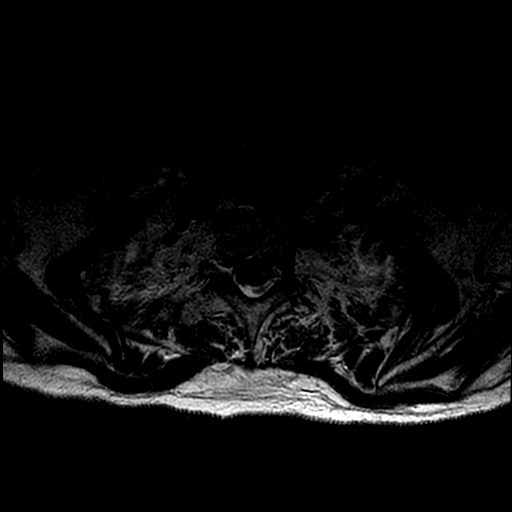
[im 5/32]
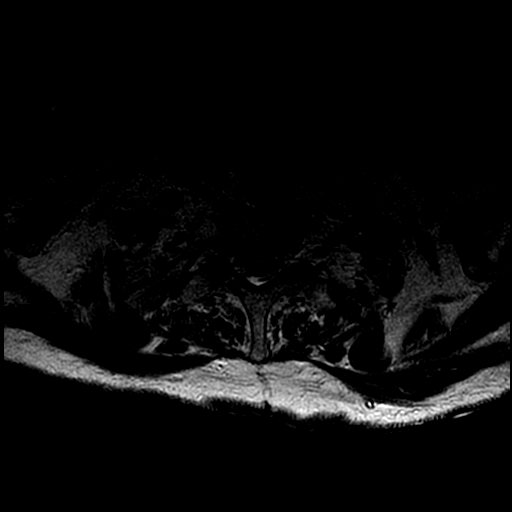
[im 10/32]
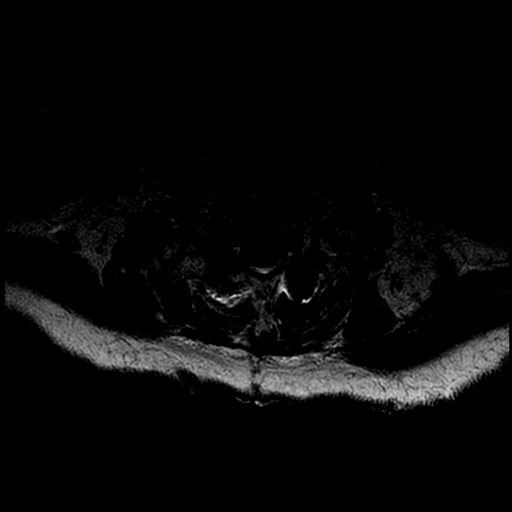
[im 15/32]
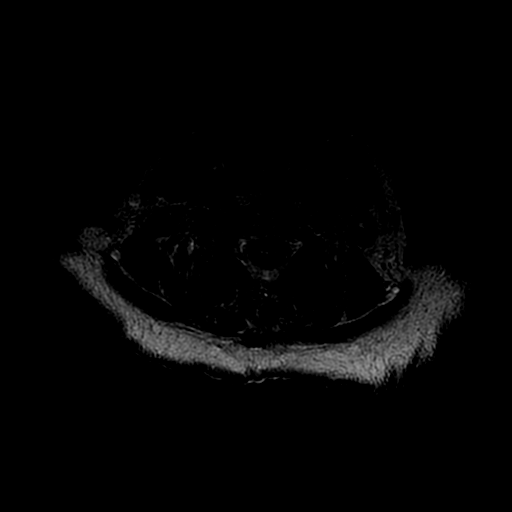
[im 17/32]
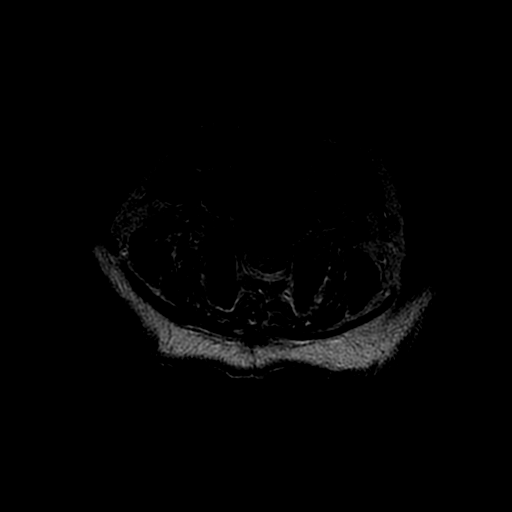
[im 27/32]
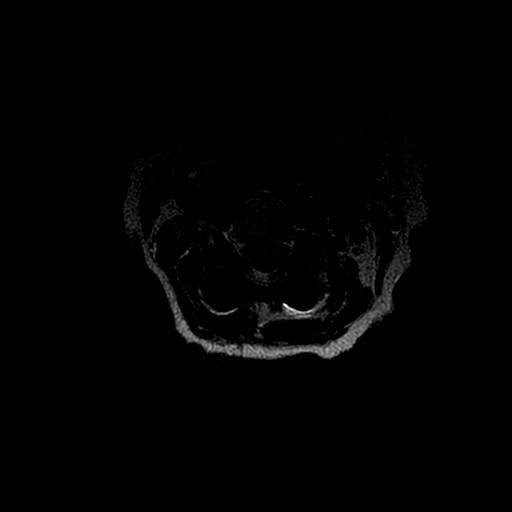

[18 of 48 positions shown; findings below may reference images not displayed]

FINDINGS: Alignment: Grade 1 retrolisthesis at C4-C5 and C5-C6 is unchanged.

Vertebrae: There has been posterior decompression at the C3-C6
levels with C3-C7 posterior spinal fusion.

Cord: Mild hyperintense T2 weighted signal within the right hemicord
at the C5-6 level, not present on the prior examination, likely
myelomalacia. Otherwise normal cord signal.

Posterior Fossa, vertebral arteries, paraspinal tissues: Negative.

Disc levels:

C1-C2: Unremarkable.

C2-C3: Small disc bulge and ligamentum flavum redundancy cause mild
spinal canal stenosis. No neural foraminal stenosis.

C3-C4: Posterior decompression with wide patency of the thecal sac.
No foraminal stenosis.

C4-C5: Posterior decompression with widely patent thecal sac.
Mild-to-moderate right neural foraminal stenosis. Mild central disc
protrusion.

C5-C6: Medium-sized central disc protrusion. Status post posterior
decompression with wide patency of the thecal sac. Mild hyperintense
T2 weighted signal within the right hemicord. Moderate right neural
foraminal stenosis.

C6-C7: Disc osteophyte complex and mild facet hypertrophy resulting
in moderate spinal canal stenosis with mild flattening of the cord.

C7-T1: Normal disc space and facets. No spinal canal or
neuroforaminal stenosis.
IMPRESSION: 1. Hyperintense T2 weighted signal within the right hemicord at the
C5-6 level, likely myelomalacia resulting from prior compression.
2. Moderate spinal canal stenosis at C6-7 with flattening of cord,
but no signal change.
3. Mild spinal canal stenosis is CT C3.
4. Moderate right neural foraminal stenosis at C5-C6 and
mild-to-moderate right foraminal stenosis at C4-C5.

## 2019-09-09 ENCOUNTER — Other Ambulatory Visit: Payer: Self-pay

## 2019-09-09 ENCOUNTER — Ambulatory Visit (INDEPENDENT_AMBULATORY_CARE_PROVIDER_SITE_OTHER): Payer: Medicare Other

## 2019-09-09 DIAGNOSIS — E538 Deficiency of other specified B group vitamins: Secondary | ICD-10-CM | POA: Diagnosis not present

## 2019-09-09 MED ORDER — CYANOCOBALAMIN 1000 MCG/ML IJ SOLN
1000.0000 ug | Freq: Once | INTRAMUSCULAR | Status: AC
  Administered 2019-09-09: 1000 ug via INTRAMUSCULAR

## 2019-09-09 NOTE — Progress Notes (Signed)
Patient presented for B 12 injection to left deltoid, patient voiced no concerns nor showed any signs of distress during injection. 

## 2019-09-12 ENCOUNTER — Other Ambulatory Visit: Payer: Self-pay

## 2019-09-12 ENCOUNTER — Ambulatory Visit
Admission: RE | Admit: 2019-09-12 | Discharge: 2019-09-12 | Disposition: A | Discharge status: Home / Self Care | Payer: Medicare Other | Source: Ambulatory Visit | Attending: Physical Medicine and Rehabilitation | Admitting: Physical Medicine and Rehabilitation

## 2019-09-12 DIAGNOSIS — M5442 Lumbago with sciatica, left side: Secondary | ICD-10-CM | POA: Diagnosis present

## 2019-09-17 ENCOUNTER — Other Ambulatory Visit: Payer: Self-pay | Admitting: Family Medicine

## 2019-10-01 ENCOUNTER — Telehealth: Payer: Self-pay | Admitting: Pulmonary Disease

## 2019-10-01 ENCOUNTER — Encounter: Payer: Self-pay | Admitting: Pulmonary Disease

## 2019-10-01 ENCOUNTER — Other Ambulatory Visit: Payer: Self-pay

## 2019-10-01 ENCOUNTER — Ambulatory Visit: Payer: Medicare Other | Admitting: Pulmonary Disease

## 2019-10-01 VITALS — BP 112/82 | HR 66 | Temp 97.9°F | Ht 63.0 in | Wt 104.6 lb

## 2019-10-01 DIAGNOSIS — J4530 Mild persistent asthma, uncomplicated: Secondary | ICD-10-CM

## 2019-10-01 DIAGNOSIS — R0600 Dyspnea, unspecified: Secondary | ICD-10-CM

## 2019-10-01 DIAGNOSIS — R0609 Other forms of dyspnea: Secondary | ICD-10-CM

## 2019-10-01 DIAGNOSIS — J8410 Pulmonary fibrosis, unspecified: Secondary | ICD-10-CM

## 2019-10-01 DIAGNOSIS — Z930 Tracheostomy status: Secondary | ICD-10-CM

## 2019-10-01 MED ORDER — BUDESONIDE 0.25 MG/2ML IN SUSP: 0.2500 mg | Freq: Two times a day (BID) | RESPIRATORY_TRACT | 11 refills | Status: DC

## 2019-10-01 NOTE — Patient Instructions (Signed)
What we discussed today:  I am adding a anti-inflammatory to your nebulizer regimen.  I recommend you use DuoNeb twice a day then follow with budesonide (Pulmicort) 0.25 mg (1 ampoule) twice a day  I do not recommend any further Covid vaccines because you are truly allergic  We will see you in follow-up in 4 months time call sooner should any new difficulties arise

## 2019-10-01 NOTE — Telephone Encounter (Signed)
Rx for Pulmicort has been resent with dx code.  Pt is aware and voiced her understanding.  Nothing further is needed.

## 2019-10-01 NOTE — Progress Notes (Signed)
 Assessment & Plan:  1. Mild persistent asthma without complication (Primary)  2. Tracheostomy dependence (HCC)  3. Mild pleural-parenchymal fibroelastosis  4. DOE (dyspnea on exertion)   Patient Instructions  What we discussed today: I am adding a anti-inflammatory to your nebulizer regimen.  I recommend you use DuoNeb twice a day then follow with budesonide  (Pulmicort ) 0.25 mg (1 ampoule) twice a day I do not recommend any further Covid vaccines because you are truly allergic We will see you in follow-up in 4 months time call sooner should any new difficulties arise    Please note: late entry documentation due to logistical difficulties during COVID-19 pandemic. This note is filed for information purposes only, and is not intended to be used for billing, nor does it represent the full scope/nature of the visit in question. Please see any associated scanned media linked to date of encounter for additional pertinent information.  Subjective:    HPI: Anne [name] is a 69 y.o. female presenting to the pulmonology clinic on 10/01/2019 with report of: Follow-up (c/o sob with exertion, prod cough with clear to yellow mucus, occ wheezing, sinus pressure and nasal drainage green in color )     Outpatient Encounter Medications as of 10/01/2019  Medication Sig Note   acetaminophen  (TYLENOL ) 500 MG tablet Take 1,000 mg by mouth 2 (two) times daily as needed for moderate pain or headache.    diphenhydrAMINE  (BENADRYL ) 25 MG tablet Take 50 mg by mouth daily as needed for allergies (Asthma).    EPINEPHrine  0.3 mg/0.3 mL IJ SOAJ injection Inject 0.3 mg into the muscle as needed for anaphylaxis.     ibuprofen  (ADVIL ,MOTRIN ) 200 MG tablet Take 400-800 mg by mouth daily as needed for headache or moderate pain.    ipratropium-albuterol  (DUONEB) 0.5-2.5 (3) MG/3ML SOLN Take 3 mLs by nebulization every 4 (four) hours as needed. Dx 496    rizatriptan (MAXALT-MLT) 10 MG disintegrating tablet TAKE  1 TABLET (10 MG TOTAL) BY MOUTH DAILY AS NEEDED FOR MIGRAINE. MAY REPEAT FOR ONE DOSE 2 HOURS AFTER THE FIRST ONE IF NEEDED.    sodium chloride  (OCEAN) 0.65 % SOLN nasal spray Place 1 spray into both nostrils every 4 (four) hours as needed for congestion.     White Petrolatum-Mineral Oil (LUBRICANT EYE) OINT Place 1 application into both eyes 2 (two) times daily as needed (for dry eyes).     [DISCONTINUED] azithromycin  (ZITHROMAX ) 250 MG tablet Take 2 tablets (500mg  total) today,then 1 tablet (250mg ) x4 days    [DISCONTINUED] cetirizine  (ZYRTEC ) 10 MG tablet TAKE 1 TABLET BY MOUTH EVERY DAY (NOT COVERED    [DISCONTINUED] clopidogrel  (PLAVIX ) 75 MG tablet Take 1 tablet (75 mg total) by mouth daily.    [DISCONTINUED] estradiol  (ESTRACE ) 1 MG tablet Take 1 tablet (1 mg total) by mouth daily. (Patient taking differently: Take 1 mg by mouth at bedtime. )    [DISCONTINUED] fluticasone  (FLONASE ) 50 MCG/ACT nasal spray PLACE 2 SPRAYS INTO BOTH NOSTRILS DAILY. (Patient not taking: Reported on 07/22/2020)    [DISCONTINUED] furosemide  (LASIX ) 20 MG tablet TAKE ONE TABLET BY MOUTH EVERY DAY AS NEEDED (Patient taking differently: Take 20 mg by mouth daily. )    [DISCONTINUED] metoCLOPramide  (REGLAN ) 5 MG tablet Take 1 tablet (5 mg total) by mouth 3 (three) times daily with meals as needed for nausea. (Patient not taking: Reported on 07/29/2020)    [DISCONTINUED] metoprolol  succinate (TOPROL  XL) 25 MG 24 hr tablet Take 0.5 tablets (12.5 mg total) by  mouth daily.    [DISCONTINUED] montelukast  (SINGULAIR ) 10 MG tablet TAKE ONE TABLET BY MOUTH DAILY BEDTIME (Patient taking differently: Take 10 mg by mouth at bedtime. )    [DISCONTINUED] mupirocin  ointment (BACTROBAN ) 2 % Apply 1 application topically daily as needed (FOR [facility] SITE IRRITATION).    [DISCONTINUED] Naphazoline-Pheniramine 0.027-0.315 % SOLN Place 1 drop into both eyes daily as needed (allergies).     [DISCONTINUED] pantoprazole  (PROTONIX ) 40 MG tablet Take 1  tablet (40 mg total) by mouth daily. NEEDS OFFICE VISIT FOR FURTHER REFILLS    [DISCONTINUED] potassium chloride  SA (K-DUR) 20 MEQ tablet Take 2 tablets (40 mEq total) by mouth daily. (Patient not taking: Reported on 07/22/2020) 06/23/2019: Pt can tell by the frequency of cramps when to take this med   [DISCONTINUED] predniSONE  (DELTASONE ) 20 MG tablet Take 1 tablet (20 mg total) by mouth daily with breakfast.    [DISCONTINUED] sucralfate  (CARAFATE ) 1 g tablet Take 1 tablet (1 g total) by mouth 4 (four) times daily. Pharmacy-please d/c script for 30 day supply    [DISCONTINUED] VENTOLIN  HFA 108 (90 Base) MCG/ACT inhaler INHALE 2 PUFFS INTO LUNGS EVERY 6 HOURS AS NEEDED FOR WHEEZING OR SHORTNESS OF BREATH (Patient taking differently: Inhale 2 puffs into the lungs every 6 (six) hours as needed for wheezing or shortness of breath. )    [DISCONTINUED] budesonide  (PULMICORT ) 0.25 MG/2ML nebulizer solution Take 2 mLs (0.25 mg total) by nebulization 2 (two) times daily. Use after DuoNeb    [DISCONTINUED] budesonide  (PULMICORT ) 0.25 MG/2ML nebulizer solution Take 2 mLs (0.25 mg total) by nebulization 2 (two) times daily. Use after DuoNeb    [DISCONTINUED] zolmitriptan  (ZOMIG -ZMT) 5 MG disintegrating tablet Take 1 tablet (5 mg total) by mouth daily as needed for migraine.    No facility-administered encounter medications on file as of 10/01/2019.      Objective:   Vitals:   10/01/19 1105  BP: 112/82  Pulse: 66  Temp: 97.9 F (36.6 C)  Height: 5' 3 (1.6 m)  Weight: 104 lb 9.6 oz (47.4 kg)  SpO2: 98%  TempSrc: Temporal  BMI (Calculated): 18.53     Physical exam documentation is limited by delayed entry of information.

## 2019-10-11 ENCOUNTER — Other Ambulatory Visit: Payer: Self-pay | Admitting: Family Medicine

## 2019-10-11 ENCOUNTER — Encounter: Payer: Self-pay | Admitting: Family Medicine

## 2019-10-13 ENCOUNTER — Telehealth: Payer: Self-pay | Admitting: Pulmonary Disease

## 2019-10-13 NOTE — Telephone Encounter (Signed)
Pulmicort is a steroid, as I discussed with her during her visit this is not an issue for adults and it does not get absorbed systemically and the doses prescribed.  It is necessary to reduce inflammation in her airway.  It will likely be a chronic medication.

## 2019-10-13 NOTE — Telephone Encounter (Signed)
Called and spoke to pt. Pt is questioning if pulmicort is a Steroid. I have made pt aware that pulmicort is in fact a steroid.  Pt is also questioning how long she will be on this medication?  Dr. Patsey Berthold, please advise. Thanks

## 2019-10-13 NOTE — Telephone Encounter (Signed)
Pt is aware of below message and voiced her understanding. Nothing further is needed. 

## 2019-10-14 ENCOUNTER — Other Ambulatory Visit: Payer: Self-pay

## 2019-10-14 ENCOUNTER — Ambulatory Visit (INDEPENDENT_AMBULATORY_CARE_PROVIDER_SITE_OTHER): Payer: Medicare Other

## 2019-10-14 VITALS — Ht 63.0 in | Wt 104.0 lb

## 2019-10-14 DIAGNOSIS — E538 Deficiency of other specified B group vitamins: Secondary | ICD-10-CM | POA: Diagnosis not present

## 2019-10-14 DIAGNOSIS — Z0000 Encounter for general adult medical examination without abnormal findings: Secondary | ICD-10-CM

## 2019-10-14 MED ORDER — CYANOCOBALAMIN 1000 MCG/ML IJ SOLN
1000.0000 ug | Freq: Once | INTRAMUSCULAR | Status: AC
  Administered 2019-10-14: 1000 ug via INTRAMUSCULAR

## 2019-10-14 NOTE — Progress Notes (Addendum)
Patient presented for B 12 injection to right deltoid, patient voiced no concerns nor showed any signs of distress during injection. 

## 2019-10-14 NOTE — Patient Instructions (Addendum)
Anne [name] , Thank you for taking time to come for your Medicare Wellness Visit. I appreciate your ongoing commitment to your health goals. Please review the following plan we discussed and let me know if I can assist you in the future.   These are the goals we discussed: Goals      Patient Stated   .  Follow up with Primary Care Provider (pt-stated)      Keep all routine maintenance appointments as scheduled Eat Healthy     .  I would like to be more active singing at the [facility] (pt-stated)      Dancercise, singing       This is a list of the screening recommended for you and due dates:  Health Maintenance  Topic Date Due  . Tetanus Vaccine  Never done  . Pneumonia vaccines (2 of 2 - PPSV23) 01/24/2015  . Flu Shot  10/26/2019  . Mammogram  07/14/2021  . Colon Cancer Screening  10/03/2027  . DEXA scan (bone density measurement)  Completed  .  Hepatitis C: One time screening is recommended by Center for Disease Control  (CDC) for  adults born from [birth year] through 1966.   Completed  . COVID-19 Vaccine  Discontinued    Immunizations Immunization History  Administered Date(s) Administered  . Influenza Inj Mdck Quad Pf 01/15/2019  . Influenza, Quadrivalent, Recombinant, Inj, Pf 02/06/2017, 12/19/2017  . Influenza,inj,Quad PF,6+ Mos 12/19/2012  . Influenza,trivalent, recombinat, inj, PF 01/05/2014  . PFIZER SARS-COV-2 Vaccination 05/09/2019  . Pneumococcal Conjugate-13 01/23/2014   Keep all routine maintenance appointments.   Follow up nurse visit B12 injection today @ 3:30  Advanced directives: End of life planning; Advance aging; Advanced directives discussed.  Copy of current HCPOA/Living Will requested.    Conditions/risks identified: none new  Follow up in one year for your annual wellness visit.   Preventive Care 66 Years and Older, Female Preventive care refers to lifestyle choices and visits with your health care provider that can promote health and  wellness. What does preventive care include?  A yearly physical exam. This is also called an annual well check.  Dental exams once or twice a year.  Routine eye exams. Ask your health care provider how often you should have your eyes checked.  Personal lifestyle choices, including:  Daily care of your teeth and gums.  Regular physical activity.  Eating a healthy diet.  Avoiding tobacco and drug use.  Limiting alcohol use.  Practicing safe sex.  Taking low-dose aspirin every day.  Taking vitamin and mineral supplements as recommended by your health care provider. What happens during an annual well check? The services and screenings done by your health care provider during your annual well check will depend on your age, overall health, lifestyle risk factors, and family history of disease. Counseling  Your health care provider may ask you questions about your:  Alcohol use.  Tobacco use.  Drug use.  Emotional well-being.  Home and relationship well-being.  Sexual activity.  Eating habits.  History of falls.  Memory and ability to understand (cognition).  Work and work Statistician.  Reproductive health. Screening  You may have the following tests or measurements:  Height, weight, and BMI.  Blood pressure.  Lipid and cholesterol levels. These may be checked every 5 years, or more frequently if you are over 98 years old.  Skin check.  Lung cancer screening. You may have this screening every year starting at age 27 if you have a  30-pack-year history of smoking and currently smoke or have quit within the past 15 years.  Fecal occult blood test (FOBT) of the stool. You may have this test every year starting at age 56.  Flexible sigmoidoscopy or colonoscopy. You may have a sigmoidoscopy every 5 years or a colonoscopy every 10 years starting at age 33.  Hepatitis C blood test.  Hepatitis B blood test.  Sexually transmitted disease (STD)  testing.  Diabetes screening. This is done by checking your blood sugar (glucose) after you have not eaten for a while (fasting). You may have this done every 1-3 years.  Bone density scan. This is done to screen for osteoporosis. You may have this done starting at age 70.  Mammogram. This may be done every 1-2 years. Talk to your health care provider about how often you should have regular mammograms. Talk with your health care provider about your test results, treatment options, and if necessary, the need for more tests. Vaccines  Your health care provider may recommend certain vaccines, such as:  Influenza vaccine. This is recommended every year.  Tetanus, diphtheria, and acellular pertussis (Tdap, Td) vaccine. You may need a Td booster every 10 years.  Zoster vaccine. You may need this after age 28.  Pneumococcal 13-valent conjugate (PCV13) vaccine. One dose is recommended after age 15.  Pneumococcal polysaccharide (PPSV23) vaccine. One dose is recommended after age 35. Talk to your health care provider about which screenings and vaccines you need and how often you need them. This information is not intended to replace advice given to you by your health care provider. Make sure you discuss any questions you have with your health care provider. Document Released: 04/09/2015 Document Revised: 12/01/2015 Document Reviewed: 01/12/2015 Elsevier Interactive Patient Education  2017 [place] Prevention in the Home Falls can cause injuries. They can happen to people of all ages. There are many things you can do to make your home safe and to help prevent falls. What can I do on the outside of my home?  Regularly fix the edges of walkways and driveways and fix any cracks.  Remove anything that might make you trip as you walk through a door, such as a raised step or threshold.  Trim any bushes or trees on the path to your home.  Use bright outdoor lighting.  Clear any walking  paths of anything that might make someone trip, such as rocks or tools.  Regularly check to see if handrails are loose or broken. Make sure that both sides of any steps have handrails.  Any raised decks and porches should have guardrails on the edges.  Have any leaves, snow, or ice cleared regularly.  Use sand or salt on walking paths during winter.  Clean up any spills in your garage right away. This includes oil or grease spills. What can I do in the bathroom?  Use night lights.  Install grab bars by the toilet and in the tub and shower. Do not use towel bars as grab bars.  Use non-skid mats or decals in the tub or shower.  If you need to sit down in the shower, use a plastic, non-slip stool.  Keep the floor dry. Clean up any water that spills on the floor as soon as it happens.  Remove soap buildup in the tub or shower regularly.  Attach bath mats securely with double-sided non-slip rug tape.  Do not have throw rugs and other things on the floor that can make you  trip. What can I do in the bedroom?  Use night lights.  Make sure that you have a light by your bed that is easy to reach.  Do not use any sheets or blankets that are too big for your bed. They should not hang down onto the floor.  Have a firm chair that has side arms. You can use this for support while you get dressed.  Do not have throw rugs and other things on the floor that can make you trip. What can I do in the kitchen?  Clean up any spills right away.  Avoid walking on wet floors.  Keep items that you use a lot in easy-to-reach places.  If you need to reach something above you, use a strong step stool that has a grab bar.  Keep electrical cords out of the way.  Do not use floor polish or wax that makes floors slippery. If you must use wax, use non-skid floor wax.  Do not have throw rugs and other things on the floor that can make you trip. What can I do with my stairs?  Do not leave any items  on the stairs.  Make sure that there are handrails on both sides of the stairs and use them. Fix handrails that are broken or loose. Make sure that handrails are as long as the stairways.  Check any carpeting to make sure that it is firmly attached to the stairs. Fix any carpet that is loose or worn.  Avoid having throw rugs at the top or bottom of the stairs. If you do have throw rugs, attach them to the floor with carpet tape.  Make sure that you have a light switch at the top of the stairs and the bottom of the stairs. If you do not have them, ask someone to add them for you. What else can I do to help prevent falls?  Wear shoes that:  Do not have high heels.  Have rubber bottoms.  Are comfortable and fit you well.  Are closed at the toe. Do not wear sandals.  If you use a stepladder:  Make sure that it is fully opened. Do not climb a closed stepladder.  Make sure that both sides of the stepladder are locked into place.  Ask someone to hold it for you, if possible.  Clearly mark and make sure that you can see:  Any grab bars or handrails.  First and last steps.  Where the edge of each step is.  Use tools that help you move around (mobility aids) if they are needed. These include:  Canes.  Walkers.  Scooters.  Crutches.  Turn on the lights when you go into a dark area. Replace any light bulbs as soon as they burn out.  Set up your furniture so you have a clear path. Avoid moving your furniture around.  If any of your floors are uneven, fix them.  If there are any pets around you, be aware of where they are.  Review your medicines with your doctor. Some medicines can make you feel dizzy. This can increase your chance of falling. Ask your doctor what other things that you can do to help prevent falls. This information is not intended to replace advice given to you by your health care provider. Make sure you discuss any questions you have with your health care  provider. Document Released: 01/07/2009 Document Revised: 08/19/2015 Document Reviewed: 04/17/2014 Elsevier Interactive Patient Education  2017 [employer].

## 2019-10-14 NOTE — Progress Notes (Signed)
Subjective:   Anne [name] is a 69 y.o. female who presents for Medicare Annual (Subsequent) preventive examination.  Review of Systems    No ROS.  Medicare Wellness Virtual Visit.   Cardiac Risk Factors include: advanced age (>74men, >46 women)     Objective:    Today's Vitals   10/14/19 0835  Weight: 104 lb (47.2 kg)  Height: 5\' 3"  (1.6 m)   Body mass index is 18.42 kg/m.  Advanced Directives 10/14/2019 01/13/2019 10/11/2018 07/01/2018 05/07/2018 02/18/2018 12/11/2017  Does Patient Have a Medical Advance Directive? Yes Yes Yes Yes Yes Yes Yes  Type of Paramedic of [place];Living will [place];Living will [place];Living will [place];Living will Living will [place];Living will [place];Living will  Does patient want to make changes to medical advance directive? No - Patient declined - No - Patient declined - - - -  Copy of [place] in Chart? No - copy requested No - copy requested No - copy requested No - copy requested - No - copy requested -  Would patient like information on creating a medical advance directive? - - - - - - -    Current Medications (verified) Outpatient Encounter Medications as of 10/14/2019  Medication Sig  . acetaminophen (TYLENOL) 500 MG tablet Take 500 mg by mouth 2 (two) times daily as needed for moderate pain or headache.   . budesonide (PULMICORT) 0.25 MG/2ML nebulizer solution Take 2 mLs (0.25 mg total) by nebulization 2 (two) times daily. Use after DuoNeb  . cetirizine (ZYRTEC) 10 MG tablet TAKE 1 TABLET BY MOUTH EVERY DAY (NOT COVERED  . clopidogrel (PLAVIX) 75 MG tablet Take 1 tablet (75 mg total) by mouth daily.  . diphenhydrAMINE (BENADRYL) 25 MG tablet Take 25 mg by mouth daily as needed for allergies.  [name] EPINEPHrine (EPIPEN 2-PAK) 0.3 mg/0.3 mL IJ SOAJ injection Inject 0.3 mg into the muscle as needed  for anaphylaxis.  [name] estradiol (ESTRACE) 1 MG tablet Take 1 tablet (1 mg total) by mouth daily. (Patient taking differently: Take 1 mg by mouth at bedtime. )  . fluticasone (FLONASE) 50 MCG/ACT nasal spray PLACE 2 SPRAYS INTO BOTH NOSTRILS DAILY. (Patient taking differently: Place 2 sprays into both nostrils daily as needed for allergies. )  . furosemide (LASIX) 20 MG tablet TAKE ONE TABLET BY MOUTH EVERY DAY AS NEEDED (Patient taking differently: Take 20 mg by mouth daily. )  . ibuprofen (ADVIL,MOTRIN) 200 MG tablet Take 400-800 mg by mouth daily as needed for headache or moderate pain.  [name] ipratropium-albuterol (DUONEB) 0.5-2.5 (3) MG/3ML SOLN Take 3 mLs by nebulization every 4 (four) hours as needed. Dx 496 (Patient taking differently: Take 3 mLs by nebulization every 4 (four) hours as needed (wheezing or shortness of breath). Dx 496)  . metoCLOPramide (REGLAN) 5 MG tablet Take 1 tablet (5 mg total) by mouth 3 (three) times daily with meals as needed for nausea.  . metoprolol succinate (TOPROL XL) 25 MG 24 hr tablet Take 0.5 tablets (12.5 mg total) by mouth daily.  . montelukast (SINGULAIR) 10 MG tablet TAKE ONE TABLET BY MOUTH EVERY DAY AT BEDTIME.  . mupirocin ointment (BACTROBAN) 2 % Apply 1 application daily as needed topically (FOR [facility] SITE IRRITATION).   . Naphazoline-Pheniramine (OPCON-A) 0.027-0.315 % SOLN Place 1 drop into both eyes daily as needed (allergies).   . pantoprazole (PROTONIX) 40 MG tablet Take 1 tablet (  40 mg total) by mouth daily. NEEDS OFFICE VISIT FOR FURTHER REFILLS  . potassium chloride SA (K-DUR) 20 MEQ tablet Take 2 tablets (40 mEq total) by mouth daily. (Patient taking differently: Take 20 mEq by mouth daily as needed (low potassium). )  . rizatriptan (MAXALT-MLT) 10 MG disintegrating tablet TAKE 1 TABLET (10 MG TOTAL) BY MOUTH DAILY AS NEEDED FOR MIGRAINE. MAY REPEAT FOR ONE DOSE 2 HOURS AFTER THE FIRST ONE IF NEEDED.  . sodium chloride (OCEAN) 0.65 % SOLN nasal spray  Place 1 spray into both nostrils every 4 (four) hours as needed for congestion.   . sucralfate (CARAFATE) 1 g tablet Take 1 tablet (1 g total) by mouth 4 (four) times daily. Pharmacy-please d/c script for 30 day supply  . VENTOLIN HFA 108 (90 Base) MCG/ACT inhaler INHALE 2 PUFFS INTO LUNGS EVERY 6 HOURS AS NEEDED FOR WHEEZING OR SHORTNESS OF BREATH (Patient taking differently: Inhale 2 puffs into the lungs every 6 (six) hours as needed for wheezing or shortness of breath. )  . White Petrolatum-Mineral Oil (LUBRICANT EYE) OINT Place 1 application into both eyes 2 (two) times daily as needed (for dry eyes).   . [DISCONTINUED] zolmitriptan (ZOMIG-ZMT) 5 MG disintegrating tablet Take 1 tablet (5 mg total) by mouth daily as needed for migraine.   No facility-administered encounter medications on file as of 10/14/2019.    Allergies (verified) Asa [aspirin], Bee venom, Epinephrine, Feraheme [ferumoxytol], Influenza vaccines, Pfizer-biontech covid-19 vacc [covid-19 mrna vacc (moderna)], Quinolones, Shellfish allergy, Ciprofloxacin, Clarithromycin, Erythromycin, Levaquin [levofloxacin in d5w], Peanut-containing drug products, Septra [sulfamethoxazole-trimethoprim], Telithromycin, Zanaflex [tizanidine], and Adhesive [tape]   History: Past Medical History:  Diagnosis Date  . Allergic rhinitis   . Allergy    multiple, mostly aspirin, levaquin and shellfish.  . Anemia   . Asthma   . Cataract 2014   exraction with len implant  . Complication of anesthesia    Breathing problems upon waking up. 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  . Compressed cervical disc   . COPD (chronic obstructive pulmonary disease) ([place])   . CVA (cerebral infarction)   . Dyspnea   . Esophageal dysmotility   . Heart murmur    as child  . History of hiatal hernia   . IBS (irritable bowel syndrome)   . Migraine   . PICC (peripherally inserted central catheter) removal 02/20/2017  . PONV  (postoperative nausea and vomiting)   . Problems with swallowing    intermittently  . Pulmonary fibrosis ([place])   . Shingles   . Stroke [facility])    slurred speech, drawn face, imaging normal, occurred twice, [facility]-"TIA" if antything" 02/20/17-no residual effects  . Tracheostomy in place [facility])   . Vocal cord paresis    Past Surgical History:  Procedure Laterality Date  . ABDOMINAL HYSTERECTOMY    . APPLICATION OF A-CELL OF HEAD/NECK N/A 02/21/2017   Procedure: APPLICATION OF A-CELL OF HEAD/NECK;  Surgeon: Wallace Going, DO;  Location: [place];  Service: Plastics;  Laterality: N/A;  . BOTOX INJECTION N/A 07/29/2013   Procedure: BOTOX INJECTION;  Surgeon: Jerene Bears, MD;  [facility];  Service: Gastroenterology;  Laterality: N/A;  . BOTOX INJECTION N/A 05/04/2015   Procedure: BOTOX INJECTION;  Surgeon: Jerene Bears, MD;  [facility];  Service: Gastroenterology;  Laterality: N/A;  . BOTOX INJECTION N/A 02/18/2018   Procedure: BOTOX INJECTION;  Surgeon: Jerene Bears, MD;  [facility];  Service: Gastroenterology;  Laterality: N/A;  .  BOTOX INJECTION N/A 06/30/2019   Procedure: BOTOX INJECTION;  Surgeon: Jerene Bears, MD;  [facility];  Service: Gastroenterology;  Laterality: N/A;  . BREAST BIOPSY Left    neg  . BREAST SURGERY Left 2002   bx of skin  . CHOLECYSTECTOMY    . COLONOSCOPY    . ESOPHAGEAL MANOMETRY N/A 12/16/2012   Procedure: ESOPHAGEAL MANOMETRY (EM);  Surgeon: Jerene Bears, MD;  [facility];  Service: Gastroenterology;  Laterality: N/A;  . ESOPHAGOGASTRODUODENOSCOPY (EGD) WITH PROPOFOL N/A 07/29/2013   Procedure: ESOPHAGOGASTRODUODENOSCOPY (EGD) WITH PROPOFOL;  Surgeon: Jerene Bears, MD;  [facility];  Service: Gastroenterology;  Laterality: N/A;  with botox injection  . ESOPHAGOGASTRODUODENOSCOPY (EGD) WITH PROPOFOL N/A 05/04/2015   Procedure: ESOPHAGOGASTRODUODENOSCOPY (EGD) WITH PROPOFOL;  Surgeon: Jerene Bears,  MD;  [facility];  Service: Gastroenterology;  Laterality: N/A;  . ESOPHAGOGASTRODUODENOSCOPY (EGD) WITH PROPOFOL N/A 02/18/2018   Procedure: ESOPHAGOGASTRODUODENOSCOPY (EGD) WITH PROPOFOL;  Surgeon: Jerene Bears, MD;  [facility];  Service: Gastroenterology;  Laterality: N/A;  . ESOPHAGOGASTRODUODENOSCOPY (EGD) WITH PROPOFOL N/A 06/30/2019   Procedure: ESOPHAGOGASTRODUODENOSCOPY (EGD) WITH PROPOFOL;  Surgeon: Jerene Bears, MD;  [facility];  Service: Gastroenterology;  Laterality: N/A;  . EYE SURGERY     Catarct surgery 2014  . Eye Surgery AS Child Left   . INCISION AND DRAINAGE OF WOUND N/A 02/21/2017   Procedure: IRRIGATION AND DEBRIDEMENT WOUND NECK;  Surgeon: Wallace Going, DO;  Location: [place];  Service: Plastics;  Laterality: N/A;  . JEJUNOSTOMY FEEDING TUBE     x2 both failed. no longer has  . LUMBAR LAMINECTOMY/DECOMPRESSION MICRODISCECTOMY Bilateral 05/13/2018   Procedure: Laminectomy and Foraminotomy - Lumbar four-Lumbar five- bilateral;  Surgeon: Eustace Moore, MD;  Location: [place];  Service: Neurosurgery;  Laterality: Bilateral;  . MULTIPLE TOOTH EXTRACTIONS     2 teeth removed  . OOPHORECTOMY    . POSTERIOR CERVICAL FUSION/FORAMINOTOMY N/A 01/10/2017   Procedure: LAMINECTOMY AND FORAMINOTOMY CERVICAL FOUR-CERVICAL FIVE, CERVICAL FIVE-SIX POSTERIOR CERVICAL INSTRUMENT FUSION CERVICAL THREE-CERVICAL SEVEN,CERVICAL LAMINECTOMY CERVICAL THREE-CERVICAL SEVEN.;  Surgeon: Eustace Moore, MD;  Location: [place];  Service: Neurosurgery;  Laterality: N/A;  posterior  . TRACHEOSTOMY  1996   done at [facility], Dr. Kathyrn Sheriff  . TUBAL LIGATION    . VIDEO BRONCHOSCOPY Bilateral 11/20/2012   Procedure: VIDEO BRONCHOSCOPY WITH FLUORO;  Surgeon: Juanito Doom, MD;  [facility];  Service: Cardiopulmonary;  Laterality: Bilateral;   Family History  Problem Relation Age of Onset  . Asthma Cousin   . COPD Cousin   . Breast cancer Maternal Grandmother  60  . Asthma Father   . Kidney cancer Father   . Arrhythmia Father   . Lung cancer Father   . Lung cancer Paternal Uncle   . COPD Paternal Grandfather   . Alcohol abuse Mother   . Breast cancer Maternal Aunt 58  . Bladder Cancer Neg Hx   . Colon cancer Neg Hx   . Esophageal cancer Neg Hx   . Pancreatic cancer Neg Hx   . Stomach cancer Neg Hx   . Liver disease Neg Hx    Social History   Socioeconomic History  . Marital status: Married    Spouse name: Not on file  . Number of children: 4  . Years of education: Not on file  . Highest education level: Not on file  Occupational History  . Not on file  Tobacco Use  . Smoking status:  Never Smoker  . Smokeless tobacco: Never Used  Vaping Use  . Vaping Use: Never used  Substance and Sexual Activity  . Alcohol use: Yes    Alcohol/week: 0.0 standard drinks    Comment: occ.  . Drug use: No  . Sexual activity: Not on file  Other Topics Concern  . Not on file  Social History Narrative   Lives in [place] with husband.  She has four children.   Retired from [employer]:   . Difficulty of Paying Living Expenses:   Food Insecurity:   . Worried About Charity fundraiser in the Last Year:   . Arboriculturist in the Last Year:   Transportation Needs:   . Film/video editor (Medical):   [name] Lack of Transportation (Non-Medical):   Physical Activity:   . Days of Exercise per Week:   . Minutes of Exercise per Session:   Stress: No Stress Concern Present  . Feeling of Stress : Not at all  Social Connections: Unknown  . Frequency of Communication with Friends and Family: More than three times a week  . Frequency of Social Gatherings with Friends and Family: More than three times a week  . Attends Religious Services: Not on file  . Active Member of Clubs or Organizations: Yes  . Attends Archivist Meetings: More than 4 times per year  . Marital  Status: Married    Tobacco Counseling Counseling given: Not Answered   Clinical Intake:  Pre-visit preparation completed: Yes        Diabetes: No  How often do you need to have someone help you when you read instructions, pamphlets, or other written materials from your doctor or pharmacy?: 1 - Never   Interpreter Needed?: No      Activities of Daily Living In your present state of health, do you have any difficulty performing the following activities: 10/14/2019  Hearing? Y  Comment Hearing aids  Vision? N  Difficulty concentrating or making decisions? N  Walking or climbing stairs? N  Dressing or bathing? N  Doing errands, shopping? N  Preparing Food and eating ? N  Using the Toilet? N  In the past six months, have you accidently leaked urine? N  Do you have problems with loss of bowel control? N  Managing your Medications? N  Managing your Finances? N  Housekeeping or managing your Housekeeping? N  Some recent data might be hidden    Patient Care Team: [name], MD as PCP - General (Family Medicine) Wellington Hampshire, MD as PCP - Cardiology (Cardiology) Nickie Retort, MD as Consulting Physician (Urology) Pyrtle, Lajuan Lines, MD as Consulting Physician (Gastroenterology)  Indicate any recent Medical Services you may have received from other than [facility] providers in the past year (date may be approximate).     Assessment:   This is a routine wellness examination for Lajean.  I connected with Adaly today by telephone and verified that I am speaking with the correct person using two identifiers. Location patient: home Location provider: work Persons participating in the virtual visit: patient, Marine scientist.    I discussed the limitations, risks, security and privacy concerns of performing an evaluation and management service by telephone and the availability of in person appointments. The patient expressed understanding and verbally consented to this  telephonic visit.    Interactive audio and video  telecommunications were attempted between this provider and patient, however failed, due to patient having technical difficulties OR patient did not have access to video capability.  We continued and completed visit with audio only.  Some vital signs may be absent or patient reported.   Hearing/Vision screen  Hearing Screening   125Hz  250Hz  500Hz  1000Hz  2000Hz  3000Hz  4000Hz  6000Hz  8000Hz   Right ear:           Left ear:           Comments: Followed by [facility] Hearing aids, bilateral  Vision Screening Comments: Followed by [facility], Dr. Jeni Salles Wears corrective lenses Cataract extraction, bilateral Visual acuity not assessed, virtual visit.  They have seen their ophthalmologist in the last 12 months.     Dietary issues and exercise activities discussed: Low GI diet Good water intake Current Exercise Habits: Home exercise routine, Type of exercise: stretching;calisthenics;yoga, Intensity: MildCaffeine- minimal  Goals      Patient Stated   .  Follow up with Primary Care Provider (pt-stated)      Keep all routine maintenance appointments as scheduled Eat Healthy     .  I would like to be more active singing at the [facility] (pt-stated)      Dancercise, singing      Depression Screen PHQ 2/9 Scores 10/14/2019 08/19/2019 04/18/2019 10/11/2018 10/11/2018 10/09/2017 08/01/2017  PHQ - 2 Score 0 0 0 0 0 0 0  PHQ- 9 Score - - - 0 - - 3    Fall Risk Fall Risk  10/14/2019 08/19/2019 04/18/2019 11/11/2018 10/11/2018  Falls in the past year? 1 1 0 0 0  Number falls in past yr: 0 0 0 0 0  Injury with Fall? 0 0 - - 0  Comment Lost her balance while helping husband while he was on a ladder - - - -  Follow up Falls evaluation completed Falls evaluation completed Falls evaluation completed - Falls evaluation completed   Handrails in use when using stairs? Yes  Home free of loose throw rugs in walkways, pet beds, electrical  cords, etc? Yes  Adequate lighting in your home to reduce risk of falls? Yes   ASSISTIVE DEVICES UTILIZED TO PREVENT FALLS: Use of a cane, walker or w/c? No  Grab bars in the bathroom? No  Shower chair or bench in shower? No  Elevated toilet seat or a handicapped toilet? No   TIMED UP AND GO:  Was the test performed? No .   Cognitive Function: MMSE - Mini Mental State Exam 10/14/2019 10/09/2017  Not completed: Unable to complete -  Orientation to time - 5  Orientation to Place - 5  Registration - 3  Attention/ Calculation - 5  Recall - 3  Language- name 2 objects - 2  Language- repeat - 1  Language- follow 3 step command - 3  Language- read & follow direction - 1  Write a sentence - 1  Copy design - 1  Total score - 30     6CIT Screen 10/11/2018  What Year? 0 points  What month? 0 points  What time? 0 points  Count back from 20 0 points  Months in reverse 0 points  Repeat phrase 0 points  Total Score 0   Immunizations Immunization History  Administered Date(s) Administered  . Influenza Inj Mdck Quad Pf 01/15/2019  . Influenza, Quadrivalent, Recombinant, Inj, Pf 02/06/2017, 12/19/2017  . Influenza,inj,Quad PF,6+ Mos 12/19/2012  . Influenza,trivalent, recombinat, inj, PF 01/05/2014  . PFIZER  SARS-COV-2 Vaccination 05/09/2019  . Pneumococcal Conjugate-13 01/23/2014   Health Maintenance Health Maintenance  Topic Date Due  . TETANUS/TDAP  Never done  . PNA vac Low Risk Adult (2 of 2 - PPSV23) 01/24/2015  . INFLUENZA VACCINE  10/26/2019  . MAMMOGRAM  07/14/2021  . COLONOSCOPY  10/03/2027  . DEXA SCAN  Completed  . Hepatitis C Screening  Completed  . COVID-19 Vaccine  Discontinued   Pneumococcal- Advised may receive this vaccine at local pharmacy or Health Dept. Aware to provide a copy of the vaccination record if obtained from local pharmacy or Health Dept. Verbalized acceptance and understanding.  Tdap- Due, Education has been provided regarding the importance  of this vaccine. Advised may receive this vaccine at local pharmacy or Health Dept. Aware to provide a copy of the vaccination record if obtained from local pharmacy or Health Dept. Verbalized acceptance and understanding.  Dental Screening: Recommended annual dental exams for proper oral hygiene. Lower partial.   Community Resource Referral / Chronic Care Management: CRR required this visit?  No   CCM required this visit?  No      Plan:   Keep all routine maintenance appointments.   Follow up nurse visit B12 injection today @ 3:30  I have personally reviewed and noted the following in the patient's chart:   . Medical and social history . Use of alcohol, tobacco or illicit drugs  . Current medications and supplements . Functional ability and status . Nutritional status . Physical activity . Advanced directives . List of other physicians . Hospitalizations, surgeries, and ER visits in previous 12 months . Vitals . Screenings to include cognitive, depression, and falls . Referrals and appointments  In addition, I have reviewed and discussed with patient certain preventive protocols, quality metrics, and best practice recommendations. A written personalized care plan for preventive services as well as general preventive health recommendations were provided to patient.     Varney Biles, LPN   [id number]/81/[id number]

## 2019-10-16 ENCOUNTER — Other Ambulatory Visit: Payer: Self-pay | Admitting: Family Medicine

## 2019-10-23 ENCOUNTER — Telehealth: Payer: Self-pay | Admitting: Family Medicine

## 2019-10-23 NOTE — Telephone Encounter (Signed)
Patient is a Psychologist, occupational for [place] and she has had a severe allergic reaction to covid 19 vaccine I printed off the form for exemption for the patient to be filled out by the provider and she will be called when ready to pick up . It is in the sign basket.  Nina,cma

## 2019-10-23 NOTE — Telephone Encounter (Signed)
Completed. Please make available for the patient to pick up.

## 2019-10-23 NOTE — Telephone Encounter (Signed)
Pt called in want to know if she needs to come in have her antibiotics checked because she can't take the other pfizer vaccine because of breathing issues

## 2019-10-28 ENCOUNTER — Other Ambulatory Visit: Payer: Self-pay | Admitting: Family Medicine

## 2019-10-28 DIAGNOSIS — N951 Menopausal and female climacteric states: Secondary | ICD-10-CM

## 2019-10-28 NOTE — Telephone Encounter (Signed)
I called and informed the patient that the form was ready for pickup at the front and she has picked it up.  Kayce Betty,cma

## 2019-10-30 ENCOUNTER — Encounter: Payer: Self-pay | Admitting: Family Medicine

## 2019-11-18 ENCOUNTER — Other Ambulatory Visit: Payer: Self-pay

## 2019-11-18 ENCOUNTER — Ambulatory Visit (INDEPENDENT_AMBULATORY_CARE_PROVIDER_SITE_OTHER): Payer: Medicare Other

## 2019-11-18 DIAGNOSIS — E538 Deficiency of other specified B group vitamins: Secondary | ICD-10-CM

## 2019-11-18 MED ORDER — CYANOCOBALAMIN 1000 MCG/ML IJ SOLN
1000.0000 ug | Freq: Once | INTRAMUSCULAR | Status: AC
  Administered 2019-11-18: 1000 ug via INTRAMUSCULAR

## 2019-11-18 NOTE — Progress Notes (Signed)
Patient presented for B 12 injection to right deltoid, patient voiced no concerns nor showed any signs of distress during injection. 

## 2019-11-28 ENCOUNTER — Telehealth: Payer: Self-pay | Admitting: Allergy & Immunology

## 2019-11-28 NOTE — Telephone Encounter (Signed)
That is rather a concerning set of symptoms and does not sound short lived at all.  I would probably err on the side of caution and recommend that she get component testing.  We do have some [facility] appointments available on the 17th and the 20th.  That way she can get the vaccine after the testing is completed.  Can you call her and see if that would work for her?  Salvatore Marvel, MD [facility] [facility] [place]

## 2019-11-28 NOTE — Telephone Encounter (Signed)
Patient was scheduled for component testing on October 20th at our [facility] location per patient's preference.

## 2019-11-28 NOTE — Telephone Encounter (Signed)
[facility] rn Anne [name] [phone] has pt at their vaccine clinic wanting to get 2nd pfizer vaccine. PT had a anaphylactic reaction (respitory reaction, throat/neck tightness, stridor, sinus congestion, rash at injetion site that lasted through 2/18-2/19) to the 1st pfizer on 2/12. Was scheduled to get 2nd dose on 3/5 but was told not to get it. Was told by her allergy (dr Selena Lesser ENT Dr Margaretha Sheffield) that she can get the 2nd vaccine. He gave her a rx notes states she had a short lived reaction to first dose, able to take 2nd dose if pretreated. Advised to take decadron and benadryl. Took 4 5mg  prednisone 6:30, 25 mg benadryl 6:30 and anothe 25mg  of benadryl at 7:30. Known allergy to levoquin, asprin, iodine, shellfish, peanut. [facility] is not comfortable with administering the vaccine and would like pt to be referred to Dr Ernst Bowler for assistance.

## 2019-12-03 ENCOUNTER — Telehealth: Payer: Self-pay | Admitting: Pulmonary Disease

## 2019-12-03 NOTE — Telephone Encounter (Signed)
ATC, left VM for patient to return call in the morning.

## 2019-12-04 MED ORDER — AZELASTINE HCL 0.1 % NA SOLN: 1.0000 | Freq: Two times a day (BID) | NASAL | 6 refills | Status: AC

## 2019-12-04 NOTE — Telephone Encounter (Signed)
Sounds like allergic rhinitis.  She should use saline nasal spray daily.  Please make sure she is using flonase 1 spray in each nostril daily.  Please send script for azelastine nasal spray, 1 spray in each nostril bid.  She should continue taking zyrtec daily and singulair nightly.  If these measures don't improve her symptoms, then she would need an ROV to further assess before prescribing guaifenesin-codeine cough medication.

## 2019-12-04 NOTE — Telephone Encounter (Addendum)
Called and spoke with patient to let her know about Dr. Collie Siad recs. She expressed understanding. RX sent in. Will forward to Dr. Patsey Berthold just as an Juluis Rainier. Nothing further needed at this time.

## 2019-12-04 NOTE — Telephone Encounter (Signed)
Called and spoke with patient she states that her symptoms of head congestion and cough at night started Friday. States she gets it every year with the weather change. She is using inhaler more than normal. She has head congestion, cough with clear sputum. States in the past she has been given guaiFENesin-codeine 100-10 MG/5ML syrup and it has helped her sleep as she has not slept the last couple nights.   Dr. Halford Chessman please advise since Dr. Patsey Berthold is out of office and not in [place]

## 2019-12-04 NOTE — Telephone Encounter (Signed)
Pt returning call.  [phone].

## 2019-12-09 ENCOUNTER — Other Ambulatory Visit: Payer: Self-pay | Admitting: Family Medicine

## 2019-12-15 ENCOUNTER — Encounter: Payer: Self-pay | Admitting: Allergy & Immunology

## 2019-12-15 ENCOUNTER — Other Ambulatory Visit: Payer: Self-pay

## 2019-12-15 ENCOUNTER — Ambulatory Visit (INDEPENDENT_AMBULATORY_CARE_PROVIDER_SITE_OTHER): Payer: Medicare Other | Admitting: Allergy & Immunology

## 2019-12-15 VITALS — BP 106/68 | HR 68 | Temp 97.6°F | Resp 16 | Ht 63.5 in | Wt 104.0 lb

## 2019-12-15 DIAGNOSIS — T50B95D Adverse effect of other viral vaccines, subsequent encounter: Secondary | ICD-10-CM

## 2019-12-15 DIAGNOSIS — T50Z95D Adverse effect of other vaccines and biological substances, subsequent encounter: Secondary | ICD-10-CM

## 2019-12-15 NOTE — Progress Notes (Signed)
NEW PATIENT  Date of Service/Encounter:  12/15/19  Referring provider: [name], MD   Assessment:   Vaccines and biological substances causing adverse effect in therapeutic use  Severe persistent asthma - with steroids needed every 2 months or so  Complicated past medical history, including a tracheostomy and persistent asthma   History of adverse reactions to injections (injectable steroids)  History of adverse reactions to influenza vaccinations (wheezing)   [name] unfortunately had a reaction with the second on the skin testing.  I have never seen someone respond that quickly skin testing only, and I think there might be some component of anxiety to this.  However, with the way she was breathing, I was afraid it would just escalate if we continued with the next several injections for testing.  We did give cetirizine 20 mL and she took a couple puffs of albuterol without a spacer and she felt better in 5 minutes.  I certainly do not think the cetirizine would have started working within 5 minutes, although the puffs of albuterol might have.  She seemed very motivated to get the vaccine, but I just felt nervous proceeding with that kind of reaction so early on.  It should be noted that she has had adverse reactions to injectable steroids, many of which do contain polyethylene glycol.  In fact, we use a methylprednisolone as a polyethylene glycol source in our testing protocol.  Therefore, it is not out of the realm of possibilities that she may have reacted to that.  In light of that, we could consider giving her the [facility] in the future, which contains polysorbate.  However, she has reacted with wheezing to the flu shot, which might preclude her from getting the [employer].  Plan/Recommendations:   1. Vaccines and biological substances causing adverse effect - Unfortunately, while your skin testing ws negative, you clinically reacted poorly to the skin  testing itself. - I do not feel comfortable giving you the vaccination given your response to testing alone. - I am going to order a COVID titer to see what your antibody levels are, which might make [place] all feel better about the protection you have. - There are other vaccines coming onto the market, do it might be useful to consider getting those when available.  - There is also an NIH study looking at folks who have had anaphylactic reactions in the past to the [place] vaccinations.  - We can try to get you into that study.  2. Return if symptoms worsen or fail to improve.   Subjective:   Anne [name] is a 69 y.o. female presenting today for evaluation of  Chief Complaint  Patient presents with  . Allergic Reaction    Anne [name] has a history of the following: Patient Active Problem List   Diagnosis Date Noted  . History of anaphylaxis 04/18/2019  . Liver lesion 10/11/2018  . S/P lumbar laminectomy 05/13/2018  . Low back pain 02/07/2018  . Iron deficiency anemia 12/11/2017  . Mild persistent asthma 10/10/2017  . Palpitations 08/03/2017  . Bilateral leg edema 08/03/2017  . Anxiety 08/03/2017  . Current long-term use of postmenopausal hormone replacement therapy 03/19/2017  . S/P cervical spinal fusion 01/10/2017  . Kidney lesion 12/13/2016  . Migraine 09/12/2016  . Esophageal dysphagia 06/19/2016  . Skin nodule 01/10/2016  . Hypokalemia 01/10/2016  . Dysuria 12/15/2015  . Degenerative arthritis of cervical spine 10/28/2015  . Gastritis   .  B12 deficiency 03/15/2015  . Abdominal bloating 09/03/2014  . Slow transit constipation 09/03/2014  . Menopausal symptom 09/03/2014  . Vocal cord dysfunction 07/15/2014  . History of TIA (transient ischemic attack) 06/03/2014  . Chronic pain syndrome 04/28/2014  . Fatigue 09/30/2013  . Hypertensive lower esophageal sphincter 07/29/2013  . Achalasia of esophagus 07/29/2013  . Edema 07/01/2013  . Chronic pulmonary aspiration  06/06/2013  . Insomnia 12/13/2012  . HLD (hyperlipidemia) 12/13/2012  . Anemia 08/13/2012  . Pulmonary fibrosis ([place]) 08/05/2012  . 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  Anne [name] was referred by [name], MD.     Anne [name] is a 69 y.o. female presenting for skin testing and challenge visit. She has received one dose of the COVID19 vaccine and ended up having a "an anaphylactic type reaction". She was able to breathe then entire time. Within ten minutes, she lost her voice and starting wheezing. She has these with asthma attacks on a routine basis. She took her Benadryl and used her inhaler and this stopped it. She reports that she had "congestion" and a "different sensation". She was fully intent on working the towards the second vaccine. The first vaccine was February 12th.   She sees Dr. Eddie Dibbles with ENT.  This is who ordered her allergy shots.  She reacted to the flu shot with wheezing. She does react to prednisone when she gets injections into joints. She does have the same type of reaction with steroid injections into her joints. She has not had the Singrix. She thinks that she has had the Pneumovax shot, but she is unsure whether she reacted to that one.   Asthma/Respiratory Symptom History: She is followed by Dr. Patsey Berthold in Pulmonology. She has had a trach for 24 years. She has a "collapsed muscle" that goes over her airway and this is thought to be attacked by a virus. This is why her trach was initially placed. She rarely has to use it, maybe a couple of times per year. She is on Singulair, Zyrtec, and albuterol as needed. She took Advair several years ago with hoarseness. She needs prednisone for breathing around every 6-8 weeks. She typically takes it for five days at a time.   Allergic Rhinitis Symptom History: She is on allergy shots.  These are ordered by her ENT, Dr. Eddie Dibbles.  She thinks she is on  maintenance.  Allergy shots do seem to be helping.  Otherwise, there is no history of other atopic diseases, including food allergies, stinging insect allergies, eczema, urticaria or contact dermatitis. There is no significant infectious history. Vaccinations are up to date.    Past Medical History: Patient Active Problem List   Diagnosis Date Noted  . History of anaphylaxis 04/18/2019  . Liver lesion 10/11/2018  . S/P lumbar laminectomy 05/13/2018  . Low back pain 02/07/2018  . Iron deficiency anemia 12/11/2017  . Mild persistent asthma 10/10/2017  . Palpitations 08/03/2017  . Bilateral leg edema 08/03/2017  . Anxiety 08/03/2017  . Current long-term use of postmenopausal hormone replacement therapy 03/19/2017  . S/P cervical spinal fusion 01/10/2017  . Kidney lesion 12/13/2016  . Migraine 09/12/2016  . Esophageal dysphagia 06/19/2016  . Skin nodule 01/10/2016  . Hypokalemia 01/10/2016  . Dysuria 12/15/2015  . Degenerative arthritis of cervical spine 10/28/2015  . Gastritis   . B12 deficiency 03/15/2015  . Abdominal bloating 09/03/2014  . Slow transit constipation 09/03/2014  . Menopausal  symptom 09/03/2014  . Vocal cord dysfunction 07/15/2014  . History of TIA (transient ischemic attack) 06/03/2014  . Chronic pain syndrome 04/28/2014  . Fatigue 09/30/2013  . Hypertensive lower esophageal sphincter 07/29/2013  . Achalasia of esophagus 07/29/2013  . Edema 07/01/2013  . Chronic pulmonary aspiration 06/06/2013  . Insomnia 12/13/2012  . HLD (hyperlipidemia) 12/13/2012  . Anemia 08/13/2012  . Pulmonary fibrosis ([place]) 08/05/2012  . 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    Medication List:  Allergies as of 12/15/2019      Reactions   Asa [aspirin] Shortness Of Breath, Swelling, Other (See Comments)   Tongue swells   Bee Venom Anaphylaxis, Shortness Of Breath   Epinephrine Shortness Of Breath, Palpitations   Occurs when pt is given double the dose, pt would use epipen as  needed    Feraheme [ferumoxytol] Other (See Comments)   Throat squeezing   Influenza Vaccines Shortness Of Breath, Other (See Comments)   Can take flu vaccines without eggs    Pfizer-biontech Covid-19 Vacc [covid-19 Mrna Vacc (moderna)] Anaphylaxis   Quinolones Swelling, Other (See Comments)   Feet swell   Shellfish Allergy Anaphylaxis   Ciprofloxacin Swelling, Other (See Comments)   ALL MEDS ENDING IN -FLOXACIN MAKE THE FEET SWELL   Clarithromycin Nausea Only   Erythromycin Nausea Only   Levaquin [levofloxacin In D5w] Swelling, Other (See Comments)   Feet and legs ache and SWELL   Peanut-containing Drug Products Other (See Comments)   Wheezing (boiled or raw peanuts)   Septra [sulfamethoxazole-trimethoprim] Diarrhea   Telithromycin Nausea Only   Zanaflex [tizanidine] Other (See Comments)   Caused the patient to feel "spaced out" and "not well"   Adhesive [tape] Rash, Other (See Comments)   Paper works tape works fine       Medication List       Accurate as of December 15, 2019  3:52 PM. If you have any questions, ask your nurse or doctor.        acetaminophen 500 MG tablet Commonly known as: TYLENOL Take 500 mg by mouth 2 (two) times daily as needed for moderate pain or headache.   azelastine 0.1 % nasal spray Commonly known as: ASTELIN Place 1 spray into both nostrils 2 (two) times daily. Use in each nostril as directed   budesonide 0.25 MG/2ML nebulizer solution Commonly known as: Pulmicort Take 2 mLs (0.25 mg total) by nebulization 2 (two) times daily. Use after DuoNeb   cetirizine 10 MG tablet Commonly known as: ZYRTEC TAKE 1 TABLET BY MOUTH EVERY DAY (NOT COVERED   clopidogrel 75 MG tablet Commonly known as: PLAVIX TAKE 1 TABLET BY MOUTH EVERY DAY.   diphenhydrAMINE 25 MG tablet Commonly known as: BENADRYL Take 25 mg by mouth daily as needed for allergies.   EpiPen 2-Pak 0.3 mg/0.3 mL Soaj injection Generic drug: EPINEPHrine Inject 0.3 mg into the  muscle as needed for anaphylaxis.   estradiol 1 MG tablet Commonly known as: ESTRACE TAKE 1 TABLET BY MOUTH EVERY DAY   fluticasone 50 MCG/ACT nasal spray Commonly known as: FLONASE PLACE 2 SPRAYS INTO BOTH NOSTRILS DAILY. What changed:   when to take this  reasons to take this   furosemide 20 MG tablet Commonly known as: LASIX TAKE ONE TABLET BY MOUTH EVERY DAY AS NEEDED What changed:   how much to take  how to take this  when to take this  additional instructions   ibuprofen 200 MG tablet Commonly known as: ADVIL Take 400-800 mg by mouth daily  as needed for headache or moderate pain.   ipratropium-albuterol 0.5-2.5 (3) MG/3ML Soln Commonly known as: DuoNeb Take 3 mLs by nebulization every 4 (four) hours as needed. Dx 496 What changed: reasons to take this   [place] 1 application into both eyes 2 (two) times daily as needed (for dry eyes).   metoCLOPramide 5 MG tablet Commonly known as: Reglan Take 1 tablet (5 mg total) by mouth 3 (three) times daily with meals as needed for nausea.   metoprolol succinate 25 MG 24 hr tablet Commonly known as: Toprol XL Take 0.5 tablets (12.5 mg total) by mouth daily.   montelukast 10 MG tablet Commonly known as: SINGULAIR TAKE ONE TABLET BY MOUTH EVERY DAY AT BEDTIME.   mupirocin ointment 2 % Commonly known as: BACTROBAN Apply 1 application daily as needed topically (FOR [facility] SITE IRRITATION).   Opcon-A 0.027-0.315 % Soln Generic drug: Naphazoline-Pheniramine Place 1 drop into both eyes daily as needed (allergies).   pantoprazole 40 MG tablet Commonly known as: PROTONIX Take 1 tablet (40 mg total) by mouth daily. NEEDS OFFICE VISIT FOR FURTHER REFILLS   potassium chloride SA 20 MEQ tablet Commonly known as: KLOR-CON Take 2 tablets (40 mEq total) by mouth daily. What changed:   how much to take  when to take this  reasons to take this   rizatriptan 10 MG disintegrating tablet Commonly known  as: MAXALT-MLT TAKE 1 TABLET (10 MG TOTAL) BY MOUTH DAILY AS NEEDED FOR MIGRAINE. MAY REPEAT FOR ONE DOSE 2 HOURS AFTER THE FIRST ONE IF NEEDED.   sodium chloride 0.65 % Soln nasal spray Commonly known as: Anne Place 1 spray into both nostrils every 4 (four) hours as needed for congestion.   sucralfate 1 g tablet Commonly known as: CARAFATE Take 1 tablet (1 g total) by mouth 4 (four) times daily. Pharmacy-please d/c script for 30 day supply   Ventolin HFA 108 (90 Base) MCG/ACT inhaler Generic drug: albuterol INHALE 2 PUFFS INTO LUNGS EVERY 6 HOURS AS NEEDED FOR WHEEZING OR SHORTNESS OF BREATH What changed: See the new instructions.       Birth History: non-contributory  Developmental History: non-contributory  Past Surgical History: Past Surgical History:  Procedure Laterality Date  . ABDOMINAL HYSTERECTOMY    . APPLICATION OF A-CELL OF HEAD/NECK N/A 02/21/2017   Procedure: APPLICATION OF A-CELL OF HEAD/NECK;  Surgeon: Wallace Going, DO;  Location: [facility];  Service: Plastics;  Laterality: N/A;  . BOTOX INJECTION N/A 07/29/2013   Procedure: BOTOX INJECTION;  Surgeon: Jerene Bears, MD;  [facility];  Service: Gastroenterology;  Laterality: N/A;  . BOTOX INJECTION N/A 05/04/2015   Procedure: BOTOX INJECTION;  Surgeon: Jerene Bears, MD;  [facility];  Service: Gastroenterology;  Laterality: N/A;  . BOTOX INJECTION N/A 02/18/2018   Procedure: BOTOX INJECTION;  Surgeon: Jerene Bears, MD;  [facility];  Service: Gastroenterology;  Laterality: N/A;  . BOTOX INJECTION N/A 06/30/2019   Procedure: BOTOX INJECTION;  Surgeon: Jerene Bears, MD;  [facility];  Service: Gastroenterology;  Laterality: N/A;  . BREAST BIOPSY Left    neg  . BREAST SURGERY Left 2002   bx of skin  . CHOLECYSTECTOMY    . COLONOSCOPY    . ESOPHAGEAL MANOMETRY N/A 12/16/2012   Procedure: ESOPHAGEAL MANOMETRY (EM);  Surgeon: Jerene Bears, MD;  [facility];   Service: Gastroenterology;  Laterality: N/A;  . ESOPHAGOGASTRODUODENOSCOPY (EGD) WITH PROPOFOL N/A 07/29/2013   Procedure:  ESOPHAGOGASTRODUODENOSCOPY (EGD) WITH PROPOFOL;  Surgeon: Jerene Bears, MD;  [facility];  Service: Gastroenterology;  Laterality: N/A;  with botox injection  . ESOPHAGOGASTRODUODENOSCOPY (EGD) WITH PROPOFOL N/A 05/04/2015   Procedure: ESOPHAGOGASTRODUODENOSCOPY (EGD) WITH PROPOFOL;  Surgeon: Jerene Bears, MD;  [facility];  Service: Gastroenterology;  Laterality: N/A;  . ESOPHAGOGASTRODUODENOSCOPY (EGD) WITH PROPOFOL N/A 02/18/2018   Procedure: ESOPHAGOGASTRODUODENOSCOPY (EGD) WITH PROPOFOL;  Surgeon: Jerene Bears, MD;  [facility];  Service: Gastroenterology;  Laterality: N/A;  . ESOPHAGOGASTRODUODENOSCOPY (EGD) WITH PROPOFOL N/A 06/30/2019   Procedure: ESOPHAGOGASTRODUODENOSCOPY (EGD) WITH PROPOFOL;  Surgeon: Jerene Bears, MD;  [facility];  Service: Gastroenterology;  Laterality: N/A;  . EYE SURGERY     Catarct surgery 2014  . Eye Surgery AS Child Left   . INCISION AND DRAINAGE OF WOUND N/A 02/21/2017   Procedure: IRRIGATION AND DEBRIDEMENT WOUND NECK;  Surgeon: Wallace Going, DO;  Location: [facility];  Service: Plastics;  Laterality: N/A;  . JEJUNOSTOMY FEEDING TUBE     x2 both failed. no longer has  . LUMBAR LAMINECTOMY/DECOMPRESSION MICRODISCECTOMY Bilateral 05/13/2018   Procedure: Laminectomy and Foraminotomy - Lumbar four-Lumbar five- bilateral;  Surgeon: Eustace Moore, MD;  Location: [facility];  Service: Neurosurgery;  Laterality: Bilateral;  . MULTIPLE TOOTH EXTRACTIONS     2 teeth removed  . OOPHORECTOMY    . POSTERIOR CERVICAL FUSION/FORAMINOTOMY N/A 01/10/2017   Procedure: LAMINECTOMY AND FORAMINOTOMY CERVICAL FOUR-CERVICAL FIVE, CERVICAL FIVE-SIX POSTERIOR CERVICAL INSTRUMENT FUSION CERVICAL THREE-CERVICAL SEVEN,CERVICAL LAMINECTOMY CERVICAL THREE-CERVICAL SEVEN.;  Surgeon: Eustace Moore, MD;  Location: [facility];  Service:  Neurosurgery;  Laterality: N/A;  posterior  . TRACHEOSTOMY  1996   done at [facility], Dr. Kathyrn Sheriff  . TUBAL LIGATION    . VIDEO BRONCHOSCOPY Bilateral 11/20/2012   Procedure: VIDEO BRONCHOSCOPY WITH FLUORO;  Surgeon: Juanito Doom, MD;  [facility];  Service: Cardiopulmonary;  Laterality: Bilateral;     Family History: Family History  Problem Relation Age of Onset  . Asthma Cousin   . COPD Cousin   . Breast cancer Maternal Grandmother 60  . Asthma Father   . Kidney cancer Father   . Arrhythmia Father   . Lung cancer Father   . Lung cancer Paternal Uncle   . COPD Paternal Grandfather   . Alcohol abuse Mother   . Breast cancer Maternal Aunt 71  . Bladder Cancer Neg Hx   . Colon cancer Neg Hx   . Esophageal cancer Neg Hx   . Pancreatic cancer Neg Hx   . Stomach cancer Neg Hx   . Liver disease Neg Hx      Social History: Jayce lives at home with her family.  She is retired.  There are no pets at home.  There is no smoking.   Review of Systems  Constitutional: Negative.  Negative for chills, fever, malaise/fatigue and weight loss.  HENT: Negative.  Negative for congestion, ear discharge, ear pain and sinus pain.   Eyes: Negative for pain, discharge and redness.  Respiratory: Positive for cough. Negative for hemoptysis, sputum production, shortness of breath and wheezing.   Cardiovascular: Negative.  Negative for chest pain and palpitations.  Gastrointestinal: Negative for abdominal pain, constipation, diarrhea, heartburn, nausea and vomiting.  Skin: Positive for rash. Negative for itching.  Neurological: Negative for dizziness and headaches.  Endo/Heme/Allergies: Negative for environmental allergies. Does not bruise/bleed easily.       Objective:   Blood pressure 106/68, pulse 68, temperature 97.6 F (  36.4 C), temperature source Temporal, resp. rate 16, height 5' 3.5" (1.613 m), weight 104 lb (47.2 kg), SpO2 98 %. Body mass index is 18.13 kg/m.   Physical  Exam:   Physical Exam Constitutional:      Appearance: She is well-developed.  HENT:     Head: Normocephalic and atraumatic.     Right Ear: Tympanic membrane, ear canal and external ear normal. No drainage, swelling or tenderness. Tympanic membrane is not injected, scarred, erythematous, retracted or bulging.     Left Ear: Tympanic membrane, ear canal and external ear normal. No drainage, swelling or tenderness. Tympanic membrane is not injected, scarred, erythematous, retracted or bulging.     Nose: No nasal deformity, septal deviation, mucosal edema or rhinorrhea.     Right Sinus: No maxillary sinus tenderness or frontal sinus tenderness.     Left Sinus: No maxillary sinus tenderness or frontal sinus tenderness.     Mouth/Throat:     Mouth: Mucous membranes are not pale and not dry.     Pharynx: Uvula midline.  Eyes:     General:        Right eye: No discharge.        Left eye: No discharge.     Conjunctiva/sclera: Conjunctivae normal.     Right eye: Right conjunctiva is not injected. No chemosis.    Left eye: Left conjunctiva is not injected. No chemosis.    Pupils: Pupils are equal, round, and reactive to light.  Cardiovascular:     Rate and Rhythm: Normal rate and regular rhythm.     Heart sounds: Normal heart sounds.  Pulmonary:     Effort: Pulmonary effort is normal. No tachypnea, accessory muscle usage or respiratory distress.     Breath sounds: Normal breath sounds. No wheezing, rhonchi or rales.  Chest:     Chest wall: No tenderness.  Abdominal:     Tenderness: There is no abdominal tenderness. There is no guarding or rebound.  Lymphadenopathy:     Head:     Right side of head: No submandibular, tonsillar or occipital adenopathy.     Left side of head: No submandibular, tonsillar or occipital adenopathy.     Cervical: No cervical adenopathy.  Skin:    Coloration: Skin is not pale.     Findings: No abrasion, erythema, petechiae or rash. Rash is not papular,  urticarial or vesicular.  Neurological:     Mental Status: She is alert.      Diagnostic studies:   Allergy Studies:     COVID Vaccine Testing - 12/15/19 1457      Test Information   Medications Miralax    Triamcinolone Lot # [id number]    Triamcinolone EXP DATE 01/25/21    Methylprednisolone Lot # [id number]    Methylprednisolone EXP DATE 05/25/20    Miralax Lot # [id number]    Miralax EXP DATE 07/25/20      Pre Test Vitals   BP 106/68    Pulse 68    Resp 16      SKIN PRICK TESTING - Arm #1   Location Left Arm      HISTAMINE (1mg /mL) Skin Prick Arm #1   Histamine Time Testing Placed 1353    Histamine Wheal 2+      Control (negative - HSA) Skin Prick Arm #1   Control Time Testing Placed 1353    Control Wheal Negative      Triamcinolone (40mg /mL) Skin Prick Arm #1   Triamcinolone Time Testing Placed  1353    Triamcinolone Wheal Negative      Methylprednisolone (40mg /mL) Skin Prick Arm #1   Methylprednisolone Time Testing Placed 1353    Methylprednisolone Wheal Negative      Miralax (1:100 or 1.7 mg/mL) Skin Prick Arm #1   Miralax Time Testing Placed 1353    Miralax Wheal Negative      Miralax (1:10 or 17mg /mL) Skin Prick Arm #1   Miralax Time Testing Placed 1413    Miralax Wheal Negative      Miralax (1:1 or 170mg /mL) Skin Prick Arm #1   Miralax Wheal Omitted      INTRADERMAL TESTING - Arm #2   Location Right Arm      Control (negative - HSA) Intradermal Arm #2   Control Time Testing Placed  1413    Control Wheal Negative      Triamcinolone (1:100) Intradermal Arm #2   Triamcinolone Time Testing Placed 1413    Triamcinolone Wheal Negative      Methylprednisolone (1:100) Intradermal Arm #2   Methylprednisolone Time Testing Placed  1413    Methylprednisolone Wheal Negative      Skin Prick/Intradermal Post Testing   Skin Prick/Intradermal Testing Total Pricks 3           Allergy testing results were read and interpreted by myself, documented by clinical  staff.            Salvatore Marvel, MD Allergy [facility] [place]

## 2019-12-15 NOTE — Patient Instructions (Addendum)
1. Vaccines and biological substances causing adverse effect - Unfortunately, while your skin testing ws negative, you clinically reacted poorly to the skin testing itself. - I do not feel comfortable giving you the vaccination given your response to testing alone. - I am going to order a COVID titer to see what your antibody levels are, which might make [place] all feel better about the protection you have. - There are other vaccines coming onto the market, do it might be useful to consider getting those when available.  - There is also an NIH study looking at folks who have had anaphylactic reactions in the past to the [place] vaccinations.  - We can try to get you into that study.  2. Return if symptoms worsen or fail to improve.    Please inform us of any Emergency Department visits, hospitalizations, or changes in symptoms. Call us before going to the ED for breathing or allergy symptoms since we might be able to fit you in for a sick visit. Feel free to contact us anytime with any questions, problems, or concerns.  It was a pleasure to meet you and your husband today!  Websites that have reliable patient information: 1. American Academy of Asthma, Allergy, and Immunology: www.aaaai.org 2. Food Allergy Research and Education (FARE): foodallergy.org 3. Mothers of Asthmatics: http://www.asthmacommunitynetwork.org 4. American College of Allergy, Asthma, and Immunology: www.acaai.org   COVID-19 Vaccine Information can be found at: ShippingScam.co.uk For questions related to vaccine distribution or appointments, please email vaccine@[email or web address] or call [phone].     "Like" [place] on Facebook and Instagram for our latest updates!        Make sure you are registered to vote! If you have moved or changed any of your contact information, you will need to get this updated before voting!  In some cases, you MAY be able to register  to vote online: [email or web address]

## 2019-12-16 ENCOUNTER — Encounter: Payer: Self-pay | Admitting: Allergy & Immunology

## 2019-12-16 LAB — SARS-COV-2 SEMI-QUANTITATIVE TOTAL ANTIBODY, SPIKE
SARS-CoV-2 Semi-Quant Total Ab: 130.6 U/mL (ref <0.8)
SARS-CoV-2 Spike Ab Interp: POSITIVE

## 2019-12-17 ENCOUNTER — Encounter: Payer: Self-pay | Admitting: Allergy & Immunology

## 2019-12-19 ENCOUNTER — Ambulatory Visit: Payer: Medicare Other

## 2020-01-05 ENCOUNTER — Ambulatory Visit (INDEPENDENT_AMBULATORY_CARE_PROVIDER_SITE_OTHER): Payer: Medicare Other

## 2020-01-05 ENCOUNTER — Other Ambulatory Visit: Payer: Self-pay

## 2020-01-05 DIAGNOSIS — Z23 Encounter for immunization: Secondary | ICD-10-CM | POA: Diagnosis not present

## 2020-01-05 NOTE — Progress Notes (Signed)
   Covid-19 Vaccination Clinic  Name:  Anne [name]    MRN: [id number] DOB: 01-02-1951  01/05/2020  [name] was observed post Covid-19 immunization for 30 minutes based on pre-vaccination screening without incident. She was provided with Vaccine Information Sheet and instruction to access the V-Safe system.   [name] was instructed to call 911 with any severe reactions post vaccine: [name] Difficulty breathing  . Swelling of face and throat  . A fast heartbeat  . A bad rash all over body  . Dizziness and weakness   Immunizations Administered    Name Date Dose VIS Date Route   JANSSEN COVID-19 VACCINE 01/05/2020  9:54 AM 0.5 mL 05/24/2019 Intramuscular   Manufacturer: [name]   Lot: [id number]   [place]: [id number]

## 2020-01-05 NOTE — Telephone Encounter (Signed)
Ok to refill 

## 2020-01-06 ENCOUNTER — Ambulatory Visit (INDEPENDENT_AMBULATORY_CARE_PROVIDER_SITE_OTHER): Payer: Medicare Other

## 2020-01-06 ENCOUNTER — Other Ambulatory Visit: Payer: Self-pay

## 2020-01-06 DIAGNOSIS — E538 Deficiency of other specified B group vitamins: Secondary | ICD-10-CM

## 2020-01-06 MED ORDER — SUCRALFATE 1 G PO TABS: 1.0000 g | ORAL_TABLET | Freq: Four times a day (QID) | ORAL | 0 refills | Status: DC

## 2020-01-06 MED ORDER — CYANOCOBALAMIN 1000 MCG/ML IJ SOLN
1000.0000 ug | Freq: Once | INTRAMUSCULAR | Status: AC
  Administered 2020-01-06: 1000 ug via INTRAMUSCULAR

## 2020-01-06 NOTE — Progress Notes (Signed)
Patient presented for B 12 injection to right deltoid, patient voiced no concerns nor showed any signs of distress during injection. 

## 2020-01-07 ENCOUNTER — Telehealth: Payer: Self-pay | Admitting: Allergy & Immunology

## 2020-01-07 NOTE — Telephone Encounter (Signed)
Patient came in Monday in [place] and got the j and j shot and now she is having asthma problems and coughing . Please call her [phone]

## 2020-01-07 NOTE — Telephone Encounter (Signed)
Can you call her to check on her? She has the trach and got J&J on Monday. Just encourage her to use albuterol two puffs every 4 hours during the day scheduled for a few days to get on top of this. We can also add on a cough medicine such as tessalon pearls.   Salvatore Marvel, MD Allergy and [place] [facility] [place]

## 2020-01-07 NOTE — Telephone Encounter (Signed)
Husband said she is at a hair appointment and has had some wheezing over the last few days and some sinus congestion. After using nasal spray she is doing ok. He will inform her, and call us if they need anything else.

## 2020-01-13 ENCOUNTER — Inpatient Hospital Stay: Payer: Medicare Other | Attending: Hematology and Oncology

## 2020-01-13 ENCOUNTER — Other Ambulatory Visit: Payer: Self-pay

## 2020-01-13 DIAGNOSIS — E538 Deficiency of other specified B group vitamins: Secondary | ICD-10-CM | POA: Diagnosis not present

## 2020-01-13 DIAGNOSIS — N2889 Other specified disorders of kidney and ureter: Secondary | ICD-10-CM | POA: Diagnosis not present

## 2020-01-13 DIAGNOSIS — D509 Iron deficiency anemia, unspecified: Secondary | ICD-10-CM

## 2020-01-13 DIAGNOSIS — Z79899 Other long term (current) drug therapy: Secondary | ICD-10-CM | POA: Insufficient documentation

## 2020-01-13 LAB — CBC WITH DIFFERENTIAL/PLATELET
Abs Immature Granulocytes: 0.01 10*3/uL (ref 0.00–0.07)
Basophils Absolute: 0 10*3/uL (ref 0.0–0.1)
Basophils Relative: 1 %
Eosinophils Absolute: 0.2 10*3/uL (ref 0.0–0.5)
Eosinophils Relative: 4 %
HCT: 39.3 % (ref 36.0–46.0)
Hemoglobin: 13.4 g/dL (ref 12.0–15.0)
Immature Granulocytes: 0 %
Lymphocytes Relative: 30 %
Lymphs Abs: 1.5 10*3/uL (ref 0.7–4.0)
MCH: 32.1 pg (ref 26.0–34.0)
MCHC: 34.1 g/dL (ref 30.0–36.0)
MCV: 94.2 fL (ref 80.0–100.0)
Monocytes Absolute: 0.5 10*3/uL (ref 0.1–1.0)
Monocytes Relative: 10 %
Neutro Abs: 2.7 10*3/uL (ref 1.7–7.7)
Neutrophils Relative %: 55 %
Platelets: 199 10*3/uL (ref 150–400)
RBC: 4.17 MIL/uL (ref 3.87–5.11)
RDW: 12.1 % (ref 11.5–15.5)
WBC: 4.9 10*3/uL (ref 4.0–10.5)
nRBC: 0 % (ref 0.0–0.2)

## 2020-01-13 LAB — FERRITIN: Ferritin: 131 ng/mL (ref 11–307)

## 2020-01-13 NOTE — Progress Notes (Signed)
[facility]  [address], [address] [place], [place] [address] Phone: [phone]  Fax: [phone]   Clinic Day:  01/14/2020  Referring physician: [name], MD  Chief Complaint: Anne [name] is a 69 y.o. female with iron deficiency and a left renal mass who is seen for 1 year assessment.  HPI: The patient was last seen in the hematology clinic on 01/15/2019. At that time, she was doing well.  She denied any bleeding.  Exam was stable. Hematocrit was 38.0, hemoglobin 13.1, platelets 184,000, WBC 5,200. Ferritin was 145.   The patient was seen on by Dr. Voncille Lo (oncology at [facility]) on 03/17/2019 for a follow up regarding her small left renal mass. She had no related symptoms. Treatment with cryoablation was discussed. She wanted to continue surveillance.  EGD on 06/30/2019 by Dr. Hilarie Fredrickson was normal.  Screening mammogram on 07/15/2019 revealed no evidence of malignancy.  The patient followed up with Dr. Voncille Lo on 09/15/2019.  Imaging revealed slight increase in size of the left renal mass to 2.1 cm.  Cryoablation, partial nephrectomy, and surveillance were discussed.  She wished to continue surveillance.  MRI of the abdomen and follow-up in 1 year were planned.  Labs on 07/16/2019 revealed a hematocrit of 39.3, hemoglobin 13.4, platelets 235,000, WBC 5,600. Ferritin 110 with an iron saturation of 33% and a TIBC of 311.  During the interim, she has been fine. She does not have any symptoms associated with her renal mass. Denies hematuria. She has some stable back pain and left foot numbness. She received steroid injections in her back which helped.  The patient has been told by one of her doctors that she has a slow metabolism. She has been taking Carafate. She is sometimes constipated. She felt so full after dinner two nights ago that she vomited.   Past Medical History:  Diagnosis Date  . Allergic rhinitis   . Allergy    multiple, mostly  aspirin, levaquin and shellfish.  . Anemia   . Asthma   . Cataract 2014   exraction with len implant  . Complication of anesthesia    Breathing problems upon waking up. 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  . Compressed cervical disc   . COPD (chronic obstructive pulmonary disease) ([place])   . CVA (cerebral infarction)   . Dyspnea   . Esophageal dysmotility   . Heart murmur    as child  . History of hiatal hernia   . IBS (irritable bowel syndrome)   . Migraine   . PICC (peripherally inserted central catheter) removal 02/20/2017  . PONV (postoperative nausea and vomiting)   . Problems with swallowing    intermittently  . Pulmonary fibrosis ([place])   . Shingles   . Stroke [facility])    slurred speech, drawn face, imaging normal, occurred twice, [facility]-"TIA" if antything" 02/20/17-no residual effects  . Tracheostomy in place [facility])   . Vocal cord paresis     Past Surgical History:  Procedure Laterality Date  . ABDOMINAL HYSTERECTOMY    . APPLICATION OF A-CELL OF HEAD/NECK N/A 02/21/2017   Procedure: APPLICATION OF A-CELL OF HEAD/NECK;  Surgeon: Wallace Going, DO;  Location: [place];  Service: Plastics;  Laterality: N/A;  . BOTOX INJECTION N/A 07/29/2013   Procedure: BOTOX INJECTION;  Surgeon: Jerene Bears, MD;  [facility];  Service: Gastroenterology;  Laterality: N/A;  . BOTOX INJECTION N/A 05/04/2015   Procedure: BOTOX INJECTION;  Surgeon: Jerene Bears,  MD;  [facility];  Service: Gastroenterology;  Laterality: N/A;  . BOTOX INJECTION N/A 02/18/2018   Procedure: BOTOX INJECTION;  Surgeon: Jerene Bears, MD;  [facility];  Service: Gastroenterology;  Laterality: N/A;  . BOTOX INJECTION N/A 06/30/2019   Procedure: BOTOX INJECTION;  Surgeon: Jerene Bears, MD;  [facility];  Service: Gastroenterology;  Laterality: N/A;  . BREAST BIOPSY Left    neg  . BREAST SURGERY Left 2002   bx of skin  . CHOLECYSTECTOMY    .  COLONOSCOPY    . ESOPHAGEAL MANOMETRY N/A 12/16/2012   Procedure: ESOPHAGEAL MANOMETRY (EM);  Surgeon: Jerene Bears, MD;  [facility];  Service: Gastroenterology;  Laterality: N/A;  . ESOPHAGOGASTRODUODENOSCOPY (EGD) WITH PROPOFOL N/A 07/29/2013   Procedure: ESOPHAGOGASTRODUODENOSCOPY (EGD) WITH PROPOFOL;  Surgeon: Jerene Bears, MD;  [facility];  Service: Gastroenterology;  Laterality: N/A;  with botox injection  . ESOPHAGOGASTRODUODENOSCOPY (EGD) WITH PROPOFOL N/A 05/04/2015   Procedure: ESOPHAGOGASTRODUODENOSCOPY (EGD) WITH PROPOFOL;  Surgeon: Jerene Bears, MD;  [facility];  Service: Gastroenterology;  Laterality: N/A;  . ESOPHAGOGASTRODUODENOSCOPY (EGD) WITH PROPOFOL N/A 02/18/2018   Procedure: ESOPHAGOGASTRODUODENOSCOPY (EGD) WITH PROPOFOL;  Surgeon: Jerene Bears, MD;  [facility];  Service: Gastroenterology;  Laterality: N/A;  . ESOPHAGOGASTRODUODENOSCOPY (EGD) WITH PROPOFOL N/A 06/30/2019   Procedure: ESOPHAGOGASTRODUODENOSCOPY (EGD) WITH PROPOFOL;  Surgeon: Jerene Bears, MD;  [facility];  Service: Gastroenterology;  Laterality: N/A;  . EYE SURGERY     Catarct surgery 2014  . Eye Surgery AS Child Left   . INCISION AND DRAINAGE OF WOUND N/A 02/21/2017   Procedure: IRRIGATION AND DEBRIDEMENT WOUND NECK;  Surgeon: Wallace Going, DO;  Location: [place];  Service: Plastics;  Laterality: N/A;  . JEJUNOSTOMY FEEDING TUBE     x2 both failed. no longer has  . LUMBAR LAMINECTOMY/DECOMPRESSION MICRODISCECTOMY Bilateral 05/13/2018   Procedure: Laminectomy and Foraminotomy - Lumbar four-Lumbar five- bilateral;  Surgeon: Eustace Moore, MD;  Location: [place];  Service: Neurosurgery;  Laterality: Bilateral;  . MULTIPLE TOOTH EXTRACTIONS     2 teeth removed  . OOPHORECTOMY    . POSTERIOR CERVICAL FUSION/FORAMINOTOMY N/A 01/10/2017   Procedure: LAMINECTOMY AND FORAMINOTOMY CERVICAL FOUR-CERVICAL FIVE, CERVICAL FIVE-SIX POSTERIOR CERVICAL INSTRUMENT  FUSION CERVICAL THREE-CERVICAL SEVEN,CERVICAL LAMINECTOMY CERVICAL THREE-CERVICAL SEVEN.;  Surgeon: Eustace Moore, MD;  Location: [place];  Service: Neurosurgery;  Laterality: N/A;  posterior  . TRACHEOSTOMY  1996   done at [facility], Dr. Kathyrn Sheriff  . TUBAL LIGATION    . VIDEO BRONCHOSCOPY Bilateral 11/20/2012   Procedure: VIDEO BRONCHOSCOPY WITH FLUORO;  Surgeon: Juanito Doom, MD;  [facility];  Service: Cardiopulmonary;  Laterality: Bilateral;    Family History  Problem Relation Age of Onset  . Asthma Cousin   . COPD Cousin   . Breast cancer Maternal Grandmother 60  . Asthma Father   . Kidney cancer Father   . Arrhythmia Father   . Lung cancer Father   . Lung cancer Paternal Uncle   . COPD Paternal Grandfather   . Alcohol abuse Mother   . Breast cancer Maternal Aunt 68  . Bladder Cancer Neg Hx   . Colon cancer Neg Hx   . Esophageal cancer Neg Hx   . Pancreatic cancer Neg Hx   . Stomach cancer Neg Hx   . Liver disease Neg Hx     Social History:  reports that she has never smoked. She has never used smokeless  tobacco. She reports current alcohol use. She reports that she does not use drugs.  Patient is retired from an Sports coach. She ultimately went out on disability in 2001 following a tracheostomy. The patient is alone today.  Allergies:  Allergies  Allergen Reactions  . Asa [Aspirin] Shortness Of Breath, Swelling and Other (See Comments)    Tongue swells  . Bee Venom Anaphylaxis and Shortness Of Breath  . Epinephrine Shortness Of Breath and Palpitations    Occurs when pt is given double the dose, pt would use epipen as needed   . Feraheme [Ferumoxytol] Other (See Comments)    Throat squeezing  . Influenza Vaccines Shortness Of Breath and Other (See Comments)    Can take flu vaccines without eggs   . Pfizer-[employer]iontech Covid-19 Vacc [Covid-19 Mrna Vacc (Moderna)] Anaphylaxis  . Quinolones Swelling and Other (See Comments)    Feet swell  . Shellfish  Allergy Anaphylaxis  . Ciprofloxacin Swelling and Other (See Comments)    ALL MEDS ENDING IN -FLOXACIN MAKE THE FEET SWELL  . Clarithromycin Nausea Only  . Erythromycin Nausea Only  . Levaquin [Levofloxacin In D5w] Swelling and Other (See Comments)    Feet and legs ache and SWELL  . Peanut-Containing Drug Products Other (See Comments)    Wheezing (boiled or raw peanuts)  . Septra [Sulfamethoxazole-Trimethoprim] Diarrhea  . Telithromycin Nausea Only  . Zanaflex [Tizanidine] Other (See Comments)    Caused the patient to feel "spaced out" and "not well"  . Adhesive [Tape] Rash and Other (See Comments)    Paper works tape works fine     Current Medications: Current Outpatient Medications  Medication Sig Dispense Refill  . acetaminophen (TYLENOL) 500 MG tablet Take 500 mg by mouth 2 (two) times daily as needed for moderate pain or headache.     [name] azelastine (ASTELIN) 0.1 % nasal spray Place 1 spray into both nostrils 2 (two) times daily. Use in each nostril as directed 30 mL 6  . budesonide (PULMICORT) 0.25 MG/2ML nebulizer solution Take 2 mLs (0.25 mg total) by nebulization 2 (two) times daily. Use after DuoNeb 120 mL 11  . cetirizine (ZYRTEC) 10 MG tablet TAKE 1 TABLET BY MOUTH EVERY DAY (NOT COVERED 90 tablet 0  . clopidogrel (PLAVIX) 75 MG tablet TAKE 1 TABLET BY MOUTH EVERY DAY. 90 tablet 1  . diphenhydrAMINE (BENADRYL) 25 MG tablet Take 25 mg by mouth daily as needed for allergies.    [name] estradiol (ESTRACE) 1 MG tablet TAKE 1 TABLET BY MOUTH EVERY DAY 90 tablet 1  . fluticasone (FLONASE) 50 MCG/ACT nasal spray PLACE 2 SPRAYS INTO BOTH NOSTRILS DAILY. (Patient taking differently: Place 2 sprays into both nostrils daily as needed for allergies. ) 16 g 2  . furosemide (LASIX) 20 MG tablet TAKE ONE TABLET BY MOUTH EVERY DAY AS NEEDED (Patient taking differently: Take 20 mg by mouth daily. ) 90 tablet 1  . ibuprofen (ADVIL,MOTRIN) 200 MG tablet Take 400-800 mg by mouth daily as needed for  headache or moderate pain.    [name] ipratropium-albuterol (DUONEB) 0.5-2.5 (3) MG/3ML SOLN Take 3 mLs by nebulization every 4 (four) hours as needed. Dx 496 (Patient taking differently: Take 3 mLs by nebulization every 4 (four) hours as needed (wheezing or shortness of breath). Dx 496) 120 mL 2  . metoCLOPramide (REGLAN) 5 MG tablet Take 1 tablet (5 mg total) by mouth 3 (three) times daily with meals as needed for nausea. 60 tablet 0  . metoprolol succinate (TOPROL  XL) 25 MG 24 hr tablet Take 0.5 tablets (12.5 mg total) by mouth daily. 45 tablet 3  . montelukast (SINGULAIR) 10 MG tablet TAKE ONE TABLET BY MOUTH EVERY DAY AT BEDTIME. 90 tablet 1  . mupirocin ointment (BACTROBAN) 2 % Apply 1 application daily as needed topically (FOR [facility] SITE IRRITATION).     . Naphazoline-Pheniramine (OPCON-A) 0.027-0.315 % SOLN Place 1 drop into both eyes daily as needed (allergies).     . pantoprazole (PROTONIX) 40 MG tablet Take 1 tablet (40 mg total) by mouth daily. NEEDS OFFICE VISIT FOR FURTHER REFILLS 90 tablet 1  . potassium chloride SA (K-DUR) 20 MEQ tablet Take 2 tablets (40 mEq total) by mouth daily. (Patient taking differently: Take 20 mEq by mouth daily as needed (low potassium). ) 6 tablet 0  . predniSONE (DELTASONE) 5 MG tablet Take by mouth.    . rizatriptan (MAXALT-MLT) 10 MG disintegrating tablet TAKE 1 TABLET (10 MG TOTAL) BY MOUTH DAILY AS NEEDED FOR MIGRAINE. MAY REPEAT FOR ONE DOSE 2 HOURS AFTER THE FIRST ONE IF NEEDED. 10 tablet 0  . sodium chloride (OCEAN) 0.65 % SOLN nasal spray Place 1 spray into both nostrils every 4 (four) hours as needed for congestion.     . sucralfate (CARAFATE) 1 g tablet Take 1 tablet (1 g total) by mouth 4 (four) times daily. 360 tablet 0  . traMADol (ULTRAM) 50 MG tablet 1/2-1 po bid prn    . VENTOLIN HFA 108 (90 Base) MCG/ACT inhaler INHALE 2 PUFFS INTO LUNGS EVERY 6 HOURS AS NEEDED FOR WHEEZING OR SHORTNESS OF BREATH (Patient taking differently: Inhale 2 puffs into the  lungs every 6 (six) hours as needed for wheezing or shortness of breath. ) 18 g 1  . White Petrolatum-Mineral Oil (LUBRICANT EYE) OINT Place 1 application into both eyes 2 (two) times daily as needed (for dry eyes).     [name] EPINEPHrine (EPIPEN 2-PAK) 0.3 mg/0.3 mL IJ SOAJ injection Inject 0.3 mg into the muscle as needed for anaphylaxis. (Patient not taking: Reported on 01/14/2020)     No current facility-administered medications for this visit.    Review of Systems  Constitutional: Positive for weight loss (5 lbs). Negative for chills, diaphoresis, fever and malaise/fatigue.  HENT: Negative.  Negative for congestion, ear discharge, ear pain, hearing loss, nosebleeds, sinus pain, sore throat and tinnitus.   Eyes: Negative.  Negative for blurred vision and double vision.  Respiratory: Negative.  Negative for cough, hemoptysis, sputum production and shortness of breath.   Cardiovascular: Negative.  Negative for chest pain, palpitations, orthopnea and leg swelling.  Gastrointestinal: Positive for constipation (occasional) and vomiting (2 nights ago). Negative for abdominal pain, blood in stool, diarrhea, heartburn, melena and nausea.       IBS.  Slow metabolism.  Takes Carafate.  Genitourinary: Negative for dysuria, flank pain, frequency, hematuria and urgency.  Musculoskeletal: Positive for back pain. Negative for joint pain, myalgias and neck pain.  Skin: Negative.  Negative for itching and rash.  Neurological: Positive for sensory change (left leg numb). Negative for dizziness, tremors, speech change, focal weakness, weakness and headaches.  Endo/Heme/Allergies: Negative.  Does not bruise/bleed easily.  Psychiatric/Behavioral: Negative.  Negative for depression and memory loss. The patient is not nervous/anxious and does not have insomnia.   All other systems reviewed and are negative.  Performance status (ECOG):  1  Vitals Blood pressure (!) 112/50, pulse 65, temperature (!) 97.5 F (36.4  C), temperature source Tympanic, resp. rate 18, weight  101 lb 6.6 oz (46 kg), SpO2 100 %.   Physical Exam Vitals and nursing note reviewed.  Constitutional:      Comments: Thin woman sitting comfortably in the exam room knitting in no acute distress.  HENT:     Head: Normocephalic and atraumatic.     Mouth/Throat:     Mouth: Mucous membranes are moist.     Pharynx: Oropharynx is clear. No oropharyngeal exudate.  Eyes:     General: No scleral icterus.    Extraocular Movements: Extraocular movements intact.     Conjunctiva/sclera: Conjunctivae normal.     Pupils: Pupils are equal, round, and reactive to light.     Comments: Glasses.  Neck:     Vascular: No JVD.  Cardiovascular:     Rate and Rhythm: Normal rate.     Heart sounds: Normal heart sounds. No murmur heard.  No gallop.   Pulmonary:     Effort: Pulmonary effort is normal. No respiratory distress.     Breath sounds: Normal breath sounds. No wheezing or rales.  Abdominal:     General: Bowel sounds are normal. There is no distension.     Palpations: Abdomen is soft. There is no mass.     Tenderness: There is abdominal tenderness (diffuse mild). There is no guarding or rebound.  Musculoskeletal:        General: No swelling or tenderness. Normal range of motion.     Cervical back: Normal range of motion and neck supple.  Lymphadenopathy:     Head:     Right side of head: No preauricular, posterior auricular or occipital adenopathy.     Left side of head: No preauricular, posterior auricular or occipital adenopathy.     Cervical: No cervical adenopathy.     Upper Body:     Right upper body: No supraclavicular adenopathy.     Left upper body: No supraclavicular adenopathy.     Lower Body: No right inguinal adenopathy. No left inguinal adenopathy.  Skin:    General: Skin is warm and dry.     Coloration: Skin is not pale.     Findings: No erythema or rash.  Neurological:     Mental Status: She is alert and oriented to  person, place, and time.  Psychiatric:        Behavior: Behavior normal.        Judgment: Judgment normal.    Appointment on 01/13/2020  Component Date Value Ref Range Status  . WBC 01/13/2020 4.9  4.0 - 10.5 K/uL Final  . RBC 01/13/2020 4.17  3.87 - 5.11 MIL/uL Final  . Hemoglobin 01/13/2020 13.4  12.0 - 15.0 g/dL Final  . HCT 01/13/2020 39.3  36 - 46 % Final  . MCV 01/13/2020 94.2  80.0 - 100.0 fL Final  . MCH 01/13/2020 32.1  26.0 - 34.0 pg Final  . MCHC 01/13/2020 34.1  30.0 - 36.0 g/dL Final  . RDW 01/13/2020 12.1  11.5 - 15.5 % Final  . Platelets 01/13/2020 199  150 - 400 K/uL Final  . nRBC 01/13/2020 0.0  0.0 - 0.2 % Final  . Neutrophils Relative % 01/13/2020 55  % Final  . Neutro Abs 01/13/2020 2.7  1.7 - 7.7 K/uL Final  . Lymphocytes Relative 01/13/2020 30  % Final  . Lymphs Abs 01/13/2020 1.5  0.7 - 4.0 K/uL Final  . Monocytes Relative 01/13/2020 10  % Final  . Monocytes Absolute 01/13/2020 0.5  0.1 - 1.0 K/uL Final  .  Eosinophils Relative 01/13/2020 4  % Final  . Eosinophils Absolute 01/13/2020 0.2  0.0 - 0.5 K/uL Final  . Basophils Relative 01/13/2020 1  % Final  . Basophils Absolute 01/13/2020 0.0  0.0 - 0.1 K/uL Final  . Immature Granulocytes 01/13/2020 0  % Final  . Abs Immature Granulocytes 01/13/2020 0.01  0.00 - 0.07 K/uL Final   Performed at [facility], [address], [place], [place] [address]  Orders Only on 01/13/2020  Component Date Value Ref Range Status  . Ferritin 01/13/2020 131  11 - 307 ng/mL Final   Performed at [facility], [address] [place], [place] [address]    Assessment:  Anne [name] is a 69 y.o. female with iron deficiency anemia.  Diet is poor.  She was unresponsive to oral iron.  EGD on 10/02/2017 revealed a normal stomach and duodenum.  The GE junction was patulous.  Random duodenal biopsies revealed no evidence of sprue, active inflammation or other disorders.  Colonoscopy on 10/02/2017 was normal.   She received Feraheme 510 mg IV on 10/25/2017 and 10/31/2017.  With her second infusion, she developed an itchy throat.  She received Benadryl and Solumedrol.   Ferritin has been followed:  30.2 on 09/03/2012, 8 on 08/09/2017, 7.2 on 10/15/2017, 255 on 12/11/2017, 213 on 03/13/2018, 203 on 06/12/2018, 163 on 09/30/2018, 145 on 01/14/2019, 110 on 07/16/2019, and 131 on 10/19/021.  She has vitamin B12 deficiency.  B12 was 621 on 04/28/2014. She receives monthly parenteral B12 (last 01/06/2020).   MRI abdomen on 03/29/2017 revealed a stable to minimal increase in size of the 1.2 cm enhancing lesion arising from the upper pole of left kidney which is suspicious for a small renal neoplasm.  MRI of the abdomen at [facility] on 10/22/2017 revealed a 1.4 cm T2 hyperintense, T1 hypointense with central nodule/scar in the left superior pole.  Size was similar to prior imaging.  A small renal cell carcinoma or oncocytoma was considered.  Abdomen MRI and MRCP at [facility] on 09/15/2019 revealed a 2.1 cm mass at the superior aspect of the left kidney.  She is followed by Dr. Louanne Skye at [facility].  The patient received the [employer] COVID-19 vaccine on 01/05/2020.  Symptomatically, she denies any bleeding (melena, hematochezia, hematuria, vaginal bleeding).  Sam is stable  Plan: 1.   Review labs from 01/13/2020. 2.   Iron deficiency anemia Hematocrit 39.3.  Hemoglobin 13.4.  MCV 94.2. Ferritin 131. Hemoglobin and ferritin remain good. No Feraheme needed. Continue. 3.   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  Abdomen MRI on 09/15/2019 revealed a 2.1 cm mass in the superior aspect of the left kidney.       She has opted for ongoing surveillance.  Next abdominal MRI in 1 year. 4.   B12 deficiency  Receives monthly B12 through Dr [name] office.  Check folate annually. 5.   RTC prn.  I discussed the assessment  and treatment plan with the patient.  The patient was provided an opportunity to ask questions and all were answered.  The patient agreed with the plan and demonstrated an understanding of the instructions.  The patient was advised to call back if the symptoms worsen or if the condition fails to improve as anticipated.   Lequita Asal, MD, PhD 01/14/2020, 5:00 PM  I, De Burrs, am acting as scribe  for Melissa C. Mike Gip, MD, PhD.  I, Melissa C. Mike Gip, MD, have reviewed the above documentation for accuracy and completeness, and I agree with the above.

## 2020-01-14 ENCOUNTER — Inpatient Hospital Stay: Payer: Medicare Other

## 2020-01-14 ENCOUNTER — Inpatient Hospital Stay (HOSPITAL_BASED_OUTPATIENT_CLINIC_OR_DEPARTMENT_OTHER): Payer: Medicare Other | Admitting: Hematology and Oncology

## 2020-01-14 ENCOUNTER — Encounter: Payer: Medicare Other | Admitting: Allergy

## 2020-01-14 ENCOUNTER — Encounter: Payer: Self-pay | Admitting: Hematology and Oncology

## 2020-01-14 VITALS — BP 112/50 | HR 65 | Temp 97.5°F | Resp 18 | Wt 101.4 lb

## 2020-01-14 DIAGNOSIS — N289 Disorder of kidney and ureter, unspecified: Secondary | ICD-10-CM | POA: Diagnosis not present

## 2020-01-14 DIAGNOSIS — D509 Iron deficiency anemia, unspecified: Secondary | ICD-10-CM | POA: Diagnosis not present

## 2020-01-14 DIAGNOSIS — E538 Deficiency of other specified B group vitamins: Secondary | ICD-10-CM

## 2020-01-14 NOTE — Progress Notes (Signed)
Patient having GI issues (bloating, vomiting, nauseous.)

## 2020-01-16 ENCOUNTER — Ambulatory Visit (INDEPENDENT_AMBULATORY_CARE_PROVIDER_SITE_OTHER): Payer: Medicare Other

## 2020-01-16 ENCOUNTER — Other Ambulatory Visit: Payer: Self-pay

## 2020-01-16 DIAGNOSIS — Z23 Encounter for immunization: Secondary | ICD-10-CM | POA: Diagnosis not present

## 2020-01-18 ENCOUNTER — Encounter: Payer: Self-pay | Admitting: Family Medicine

## 2020-01-19 ENCOUNTER — Telehealth: Payer: Self-pay

## 2020-01-19 NOTE — Telephone Encounter (Signed)
Patient needed verification of her flu vaccine and this was done by creating a letter with all the information needed, patient was informed she could pick up.  Kimmerly Lora,cma

## 2020-01-29 ENCOUNTER — Encounter: Payer: Self-pay | Admitting: Pulmonary Disease

## 2020-01-29 ENCOUNTER — Ambulatory Visit (INDEPENDENT_AMBULATORY_CARE_PROVIDER_SITE_OTHER): Payer: Medicare Other | Admitting: Pulmonary Disease

## 2020-01-29 ENCOUNTER — Other Ambulatory Visit: Payer: Self-pay

## 2020-01-29 VITALS — BP 100/62 | HR 61 | Temp 97.1°F | Ht 63.5 in | Wt 103.8 lb

## 2020-01-29 DIAGNOSIS — J383 Other diseases of vocal cords: Secondary | ICD-10-CM

## 2020-01-29 DIAGNOSIS — Z930 Tracheostomy status: Secondary | ICD-10-CM

## 2020-01-29 DIAGNOSIS — J8410 Pulmonary fibrosis, unspecified: Secondary | ICD-10-CM | POA: Diagnosis not present

## 2020-01-29 DIAGNOSIS — J4540 Moderate persistent asthma, uncomplicated: Secondary | ICD-10-CM

## 2020-01-29 NOTE — Progress Notes (Signed)
Subjective:    Patient ID: Anne [name], female    DOB: May 05, 1950, 69 y.o.   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 Alva Garnet  DATA: HRCT chest 03/01/16: Extensive patchy subpleural reticulation, traction bronchiectasis, volume loss, distortion and pleural thickening relatively symmetrically and almost exclusively involving the upper lobes with associated superior hilar retraction. No frank honeycombing. Findings have progressed since  2014. 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 Chest x-ray 03/13/2018: Lateral upper lobe scarring worse in the apices change from prior.  Hyperinflation as previous.  Ileostomy in good position.  INTERVAL HISTORY: Last visit 10/01/2019:   Added Pulmicort to her nebulizer treatment.  Since that time she was able to get [place] vaccine.  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  Patient has a history of numerous allergies and intolerances to medications.  She had previous anaphylactic toyed reaction to [employer] vaccine.  She was however given the [employer] COVID-19 vaccine by her allergist it was done in increments and she was able to tolerated.  She had noted some wheezing after the first small dose of the vaccine however was able to complete the vaccine.  She has noted some wheezing particularly at nighttime.  Recall that on her prior visit we had recommended Pulmicort to be added to her nebulizer regimen.  She has not use the Pulmicort.  She is concerned about the effects of steroids.   Inhaled corticosteroids are mainstay for asthma management.  He has not had any fevers, chills or sweats.  Occasional cough, dry, no sputum production.  No hemoptysis.  Phonating well with tracheostomy with speaking valve.  No other complaints.   Review of Systems  A 10 point review of systems was performed and it is as noted above otherwise negative.  Allergies  Allergen Reactions  . Asa [Aspirin] Shortness Of Breath, Swelling and Other (See Comments)    Tongue swells  . Bee Venom Anaphylaxis and Shortness Of Breath  . Epinephrine Shortness Of Breath and Palpitations    Occurs when pt is given double the dose, pt would use epipen as needed   . Feraheme [Ferumoxytol] Other (See Comments)    Throat squeezing  . Influenza Vaccines Shortness Of Breath and Other (See Comments)    Can take flu vaccines without eggs   . Pfizer-[employer] Covid-19 Vacc [Covid-19 Mrna Vacc (Moderna)] Anaphylaxis  . Quinolones Swelling and Other (See Comments)    Feet swell  . Shellfish Allergy Anaphylaxis  . Ciprofloxacin Swelling and Other (See Comments)    ALL MEDS ENDING IN -FLOXACIN MAKE THE FEET SWELL  . Clarithromycin Nausea Only  . Erythromycin Nausea Only  . Levaquin [Levofloxacin In D5w] Swelling and Other (See Comments)    Feet and legs ache and SWELL  . Peanut-Containing Drug Products Other (See Comments)    Wheezing (boiled or raw peanuts)  . Septra [Sulfamethoxazole-Trimethoprim] Diarrhea  . Telithromycin Nausea Only  . Zanaflex [Tizanidine] Other (See Comments)    Caused the patient to feel "spaced out" and "not well"  . Adhesive [Tape] Rash and Other (See Comments)    Paper works tape works fine  Objective:   Physical Exam BP 100/62 (BP Location: Left Arm, Patient Position: Sitting, Cuff Size: Normal)   Pulse 61   Temp (!) 97.1 F (36.2 C) (Temporal)   Ht 5' 3.5" (1.613 m)   Wt 103 lb 12.8 oz (47.1 kg)   SpO2 99%   BMI 18.10 kg/m  GENERAL: Well-developed, thin but not  cachectic woman, in no acute distress.  She is fully ambulatory.  HEAD: Normocephalic, atraumatic.  EYES: Pupils equal, round, reactive to light.  No scleral icterus.  MOUTH: Nose/mouth/throat not examined due to masking requirements for COVID 19. NECK: Supple.Trachea midline. No JVD.  No adenopathy.  Tracheostomy tube in place. PULMONARY: Good air entry bilaterally.  She has bilateral end expiratory wheezes  CARDIOVASCULAR: S1 and S2. Regular rate and rhythm.  No rubs, murmurs or gallops heard. ABDOMEN: Benign. MUSCULOSKELETAL: No joint deformity, no clubbing, no edema.  NEUROLOGIC: No focal deficit noted.  No gait disturbance.  Phonates well with tracheostomy with speech valve. SKIN: Limited exam shows no rashes. PSYCH: Mood and behavior normal.      Assessment & Plan:     ICD-10-CM   1. Moderate persistent allergic asthma  J45.40    She was advised to use Pulmicort regularly Continue DuoNeb Explained rationale for anti-inflammatory and asthma  2. 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 Mild pleural-parenchymal fibroelastosis  J84.10    Most recent imaging was stable   Was instructed to use Pulmicort twice a day without fail.  This will help with her with her asthma issues.  We will see her in follow-up in 4 to 6 months time she is to contact us prior to that time should any new difficulties arise.  Renold Don, MD [facility]   *This note was dictated using voice recognition software/Dragon.  Despite best efforts to proofread, errors can occur which can change the meaning.  Any change was purely unintentional.

## 2020-01-29 NOTE — Patient Instructions (Addendum)
Use your Pulmicort twice a day during fall.  You may try reducing to once daily when winter starts.   We will see you in follow-up in 4-6 months time call sooner should any new problems arise.

## 2020-02-10 ENCOUNTER — Ambulatory Visit (INDEPENDENT_AMBULATORY_CARE_PROVIDER_SITE_OTHER): Payer: Medicare Other

## 2020-02-10 ENCOUNTER — Other Ambulatory Visit: Payer: Self-pay

## 2020-02-10 DIAGNOSIS — E538 Deficiency of other specified B group vitamins: Secondary | ICD-10-CM

## 2020-02-10 MED ORDER — CYANOCOBALAMIN 1000 MCG/ML IJ SOLN: 1000.0000 ug | Freq: Once | INTRAMUSCULAR | Status: DC

## 2020-02-10 MED ORDER — CYANOCOBALAMIN 1000 MCG/ML IJ SOLN
1000.0000 ug | Freq: Once | INTRAMUSCULAR | Status: AC
  Administered 2020-02-10: 1000 ug via INTRAMUSCULAR

## 2020-02-10 NOTE — Progress Notes (Signed)
Patient presented for B 12 injection to right deltoid, patient voiced no concerns nor showed any signs of distress during injection. 

## 2020-02-11 ENCOUNTER — Telehealth: Payer: Self-pay | Admitting: Pulmonary Disease

## 2020-02-11 NOTE — Telephone Encounter (Signed)
Called and spoke to patient.  Patient is experiencing congestion around trach. She also reports of nasal congestion, prod cough with clear sputum, headache, sinus pressure and just feeling unwell overall.  Sx have been present 1 week. Denied fever, chills or sweats  She is taking sudafed and Astelin nasal spray with no relief in sx.  She had had both covid vaccines and flu shot.  She wears her mask for a long period long time when singing in choir, she is cocerned that this may be causing the congestion.  Dr. Joline Salt advise. Thanks

## 2020-02-11 NOTE — Telephone Encounter (Signed)
Both Astelin and Sudafed are going to be very drying and make congestion worse.  In addition heating being used in the homes now also can dry nasal tissues.  I would recommend nasal saline as needed and holding off on using Sudafed or Astelin for the next few days.  She can also use nasal saline gel.  AYR is a good over-the-counter brand for both the spray and the gel.  The mask may be making the sensation worse.  We can switch her nasal spray to Flonase.

## 2020-02-11 NOTE — Telephone Encounter (Signed)
Spoke to patient and relayed below message/recommendedations.   Patient stated that she has Rx for flonase on hand. Nothing further needed.

## 2020-02-23 ENCOUNTER — Ambulatory Visit: Payer: Medicare Other | Admitting: Family Medicine

## 2020-02-26 ENCOUNTER — Other Ambulatory Visit: Payer: Self-pay

## 2020-02-26 ENCOUNTER — Telehealth (INDEPENDENT_AMBULATORY_CARE_PROVIDER_SITE_OTHER): Payer: Medicare Other | Admitting: Family Medicine

## 2020-02-26 ENCOUNTER — Encounter: Payer: Self-pay | Admitting: Family Medicine

## 2020-02-26 ENCOUNTER — Other Ambulatory Visit: Payer: Medicare Other

## 2020-02-26 DIAGNOSIS — R059 Cough, unspecified: Secondary | ICD-10-CM | POA: Insufficient documentation

## 2020-02-26 MED ORDER — HYDROCODONE-HOMATROPINE 5-1.5 MG/5ML PO SYRP: 5.0000 mL | ORAL_SOLUTION | Freq: Three times a day (TID) | ORAL | 0 refills | Status: DC | PRN

## 2020-02-26 MED ORDER — PREDNISONE 20 MG PO TABS: 40.0000 mg | ORAL_TABLET | Freq: Every day | ORAL | 0 refills | Status: DC

## 2020-02-26 NOTE — Progress Notes (Signed)
Virtual Visit via video Note  This visit type was conducted due to national recommendations for restrictions regarding the COVID-19 pandemic (e.g. social distancing).  This format is felt to be most appropriate for this patient at this time.  All issues noted in this document were discussed and addressed.  No physical exam was performed (except for noted visual exam findings with Video Visits).   I connected with Anne [name] today at  9:00 AM EST by a video enabled telemedicine application and verified that I am speaking with the correct person using two identifiers. Location patient: home Location provider: work Persons participating in the virtual visit: patient, provider  I discussed the limitations, risks, security and privacy concerns of performing an evaluation and management service by telephone and the availability of in person appointments. I also discussed with the patient that there may be a patient responsible charge related to this service. The patient expressed understanding and agreed to proceed.  Reason for visit: Same-day visit.  HPI: Cough: Patient notes symptoms started on 02/22/2020.  She developed cough that was dry to start with though is now mildly wet.  Mild congestion in her chest.  No significant sinus congestion.  No fevers.  Some chills and achiness.  Her breathing is better than it was at the start of the illness.  No loss of taste or smell.  She has been taking Tylenol and did use her inhaler due to wheezing.  Notes continued mild wheezing.  She notes no COVID-19 exposure though notes her 2 grandchildren were with her last week and one of them was diagnosed with RSV and both of them are sick.  She notes they both tested negative for Covid.  She is vaccinated against COVID-19.   ROS: See pertinent positives and negatives per HPI.  Past Medical History:  Diagnosis Date  . Allergic rhinitis   . Allergy    multiple, mostly aspirin, levaquin and shellfish.  .  Anemia   . Asthma   . Cataract 2014   exraction with len implant  . Complication of anesthesia    Breathing problems upon waking up. 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  . Compressed cervical disc   . COPD (chronic obstructive pulmonary disease) ([place])   . CVA (cerebral infarction)   . Dyspnea   . Esophageal dysmotility   . Heart murmur    as child  . History of hiatal hernia   . IBS (irritable bowel syndrome)   . Migraine   . PICC (peripherally inserted central catheter) removal 02/20/2017  . PONV (postoperative nausea and vomiting)   . Problems with swallowing    intermittently  . Pulmonary fibrosis ([place])   . Shingles   . Stroke [facility])    slurred speech, drawn face, imaging normal, occurred twice, [facility]-"TIA" if antything" 02/20/17-no residual effects  . Tracheostomy in place [facility])   . Vocal cord paresis     Past Surgical History:  Procedure Laterality Date  . ABDOMINAL HYSTERECTOMY    . APPLICATION OF A-CELL OF HEAD/NECK N/A 02/21/2017   Procedure: APPLICATION OF A-CELL OF HEAD/NECK;  Surgeon: Wallace Going, DO;  Location: [facility];  Service: Plastics;  Laterality: N/A;  . BOTOX INJECTION N/A 07/29/2013   Procedure: BOTOX INJECTION;  Surgeon: Jerene Bears, MD;  [facility];  Service: Gastroenterology;  Laterality: N/A;  . BOTOX INJECTION N/A 05/04/2015   Procedure: BOTOX INJECTION;  Surgeon: Jerene Bears, MD;  [facility];  Service: Gastroenterology;  Laterality: N/A;  . BOTOX INJECTION N/A 02/18/2018   Procedure: BOTOX INJECTION;  Surgeon: Jerene Bears, MD;  [facility];  Service: Gastroenterology;  Laterality: N/A;  . BOTOX INJECTION N/A 06/30/2019   Procedure: BOTOX INJECTION;  Surgeon: Jerene Bears, MD;  [facility];  Service: Gastroenterology;  Laterality: N/A;  . BREAST BIOPSY Left    neg  . BREAST SURGERY Left 2002   bx of skin  . CHOLECYSTECTOMY    . COLONOSCOPY    . ESOPHAGEAL MANOMETRY  N/A 12/16/2012   Procedure: ESOPHAGEAL MANOMETRY (EM);  Surgeon: Jerene Bears, MD;  [facility];  Service: Gastroenterology;  Laterality: N/A;  . ESOPHAGOGASTRODUODENOSCOPY (EGD) WITH PROPOFOL N/A 07/29/2013   Procedure: ESOPHAGOGASTRODUODENOSCOPY (EGD) WITH PROPOFOL;  Surgeon: Jerene Bears, MD;  [facility];  Service: Gastroenterology;  Laterality: N/A;  with botox injection  . ESOPHAGOGASTRODUODENOSCOPY (EGD) WITH PROPOFOL N/A 05/04/2015   Procedure: ESOPHAGOGASTRODUODENOSCOPY (EGD) WITH PROPOFOL;  Surgeon: Jerene Bears, MD;  [facility];  Service: Gastroenterology;  Laterality: N/A;  . ESOPHAGOGASTRODUODENOSCOPY (EGD) WITH PROPOFOL N/A 02/18/2018   Procedure: ESOPHAGOGASTRODUODENOSCOPY (EGD) WITH PROPOFOL;  Surgeon: Jerene Bears, MD;  [facility];  Service: Gastroenterology;  Laterality: N/A;  . ESOPHAGOGASTRODUODENOSCOPY (EGD) WITH PROPOFOL N/A 06/30/2019   Procedure: ESOPHAGOGASTRODUODENOSCOPY (EGD) WITH PROPOFOL;  Surgeon: Jerene Bears, MD;  [facility];  Service: Gastroenterology;  Laterality: N/A;  . EYE SURGERY     Catarct surgery 2014  . Eye Surgery AS Child Left   . INCISION AND DRAINAGE OF WOUND N/A 02/21/2017   Procedure: IRRIGATION AND DEBRIDEMENT WOUND NECK;  Surgeon: Wallace Going, DO;  Location: [facility];  Service: Plastics;  Laterality: N/A;  . JEJUNOSTOMY FEEDING TUBE     x2 both failed. no longer has  . LUMBAR LAMINECTOMY/DECOMPRESSION MICRODISCECTOMY Bilateral 05/13/2018   Procedure: Laminectomy and Foraminotomy - Lumbar four-Lumbar five- bilateral;  Surgeon: Eustace Moore, MD;  Location: [facility];  Service: Neurosurgery;  Laterality: Bilateral;  . MULTIPLE TOOTH EXTRACTIONS     2 teeth removed  . OOPHORECTOMY    . POSTERIOR CERVICAL FUSION/FORAMINOTOMY N/A 01/10/2017   Procedure: LAMINECTOMY AND FORAMINOTOMY CERVICAL FOUR-CERVICAL FIVE, CERVICAL FIVE-SIX POSTERIOR CERVICAL INSTRUMENT FUSION CERVICAL THREE-CERVICAL  SEVEN,CERVICAL LAMINECTOMY CERVICAL THREE-CERVICAL SEVEN.;  Surgeon: Eustace Moore, MD;  Location: [facility];  Service: Neurosurgery;  Laterality: N/A;  posterior  . TRACHEOSTOMY  1996   [facility], Dr. Kathyrn Sheriff  . TUBAL LIGATION    . VIDEO BRONCHOSCOPY Bilateral 11/20/2012   Procedure: VIDEO BRONCHOSCOPY WITH FLUORO;  Surgeon: Juanito Doom, MD;  [facility];  Service: Cardiopulmonary;  Laterality: Bilateral;    Family History  Problem Relation Age of Onset  . Asthma Cousin   . COPD Cousin   . Breast cancer Maternal Grandmother 60  . Asthma Father   . Kidney cancer Father   . Arrhythmia Father   . Lung cancer Father   . Lung cancer Paternal Uncle   . COPD Paternal Grandfather   . Alcohol abuse Mother   . Breast cancer Maternal Aunt 38  . Bladder Cancer Neg Hx   . Colon cancer Neg Hx   . Esophageal cancer Neg Hx   . Pancreatic cancer Neg Hx   . Stomach cancer Neg Hx   . Liver disease Neg Hx     SOCIAL HX: Non-smoker   Current Outpatient Medications:  .  acetaminophen (TYLENOL) 500 MG tablet, Take 500 mg by mouth 2 (two)  times daily as needed for moderate pain or headache. , Disp: , Rfl:  .  azelastine (ASTELIN) 0.1 % nasal spray, Place 1 spray into both nostrils 2 (two) times daily. Use in each nostril as directed, Disp: 30 mL, Rfl: 6 .  budesonide (PULMICORT) 0.25 MG/2ML nebulizer solution, Take 2 mLs (0.25 mg total) by nebulization 2 (two) times daily. Use after DuoNeb, Disp: 120 mL, Rfl: 11 .  cetirizine (ZYRTEC) 10 MG tablet, TAKE 1 TABLET BY MOUTH EVERY DAY (NOT COVERED, Disp: 90 tablet, Rfl: 0 .  clopidogrel (PLAVIX) 75 MG tablet, TAKE 1 TABLET BY MOUTH EVERY DAY., Disp: 90 tablet, Rfl: 1 .  diphenhydrAMINE (BENADRYL) 25 MG tablet, Take 25 mg by mouth daily as needed for allergies. , Disp: , Rfl:  .  EPINEPHrine (EPIPEN 2-PAK) 0.3 mg/0.3 mL IJ SOAJ injection, Inject 0.3 mg into the muscle as needed for anaphylaxis. , Disp: , Rfl:  .  estradiol (ESTRACE) 1 MG  tablet, TAKE 1 TABLET BY MOUTH EVERY DAY, Disp: 90 tablet, Rfl: 1 .  fluticasone (FLONASE) 50 MCG/ACT nasal spray, PLACE 2 SPRAYS INTO BOTH NOSTRILS DAILY. (Patient taking differently: Place 2 sprays into both nostrils daily as needed for allergies. ), Disp: 16 g, Rfl: 2 .  furosemide (LASIX) 20 MG tablet, TAKE ONE TABLET BY MOUTH EVERY DAY AS NEEDED (Patient taking differently: Take 20 mg by mouth daily. ), Disp: 90 tablet, Rfl: 1 .  ibuprofen (ADVIL,MOTRIN) 200 MG tablet, Take 400-800 mg by mouth daily as needed for headache or moderate pain., Disp: , Rfl:  .  ipratropium-albuterol (DUONEB) 0.5-2.5 (3) MG/3ML SOLN, Take 3 mLs by nebulization every 4 (four) hours as needed. Dx 496 (Patient taking differently: Take 3 mLs by nebulization every 4 (four) hours as needed (wheezing or shortness of breath). Dx 496), Disp: 120 mL, Rfl: 2 .  metoCLOPramide (REGLAN) 5 MG tablet, Take 1 tablet (5 mg total) by mouth 3 (three) times daily with meals as needed for nausea., Disp: 60 tablet, Rfl: 0 .  metoprolol succinate (TOPROL XL) 25 MG 24 hr tablet, Take 0.5 tablets (12.5 mg total) by mouth daily., Disp: 45 tablet, Rfl: 3 .  montelukast (SINGULAIR) 10 MG tablet, TAKE ONE TABLET BY MOUTH EVERY DAY AT BEDTIME., Disp: 90 tablet, Rfl: 1 .  mupirocin ointment (BACTROBAN) 2 %, Apply 1 application topically daily as needed (FOR [facility] SITE IRRITATION). , Disp: , Rfl:  .  Naphazoline-Pheniramine (OPCON-A) 0.027-0.315 % SOLN, Place 1 drop into both eyes daily as needed (allergies). , Disp: , Rfl:  .  pantoprazole (PROTONIX) 40 MG tablet, Take 1 tablet (40 mg total) by mouth daily. NEEDS OFFICE VISIT FOR FURTHER REFILLS, Disp: 90 tablet, Rfl: 1 .  potassium chloride SA (K-DUR) 20 MEQ tablet, Take 2 tablets (40 mEq total) by mouth daily., Disp: 6 tablet, Rfl: 0 .  predniSONE (DELTASONE) 5 MG tablet, Take by mouth. , Disp: , Rfl:  .  rizatriptan (MAXALT-MLT) 10 MG disintegrating tablet, TAKE 1 TABLET (10 MG TOTAL) BY MOUTH  DAILY AS NEEDED FOR MIGRAINE. MAY REPEAT FOR ONE DOSE 2 HOURS AFTER THE FIRST ONE IF NEEDED., Disp: 10 tablet, Rfl: 0 .  sodium chloride (OCEAN) 0.65 % SOLN nasal spray, Place 1 spray into both nostrils every 4 (four) hours as needed for congestion. , Disp: , Rfl:  .  sucralfate (CARAFATE) 1 g tablet, Take 1 tablet (1 g total) by mouth 4 (four) times daily., Disp: 360 tablet, Rfl: 0 .  traMADol (ULTRAM) 50  MG tablet, 1/2-1 po bid prn, Disp: , Rfl:  .  VENTOLIN HFA 108 (90 Base) MCG/ACT inhaler, INHALE 2 PUFFS INTO LUNGS EVERY 6 HOURS AS NEEDED FOR WHEEZING OR SHORTNESS OF BREATH (Patient taking differently: Inhale 2 puffs into the lungs every 6 (six) hours as needed for wheezing or shortness of breath. ), Disp: 18 g, Rfl: 1 .  White Petrolatum-Mineral Oil (LUBRICANT EYE) OINT, Place 1 application into both eyes 2 (two) times daily as needed (for dry eyes). , Disp: , Rfl:  .  HYDROcodone-homatropine (HYCODAN) 5-1.5 MG/5ML syrup, Take 5 mLs by mouth every 8 (eight) hours as needed for cough., Disp: 120 mL, Rfl: 0 .  predniSONE (DELTASONE) 20 MG tablet, Take 2 tablets (40 mg total) by mouth daily with breakfast., Disp: 10 tablet, Rfl: 0  EXAM:  VITALS per patient if applicable:  GENERAL: alert, oriented, appears well and in no acute distress  HEENT: atraumatic, conjunttiva clear, no obvious abnormalities on inspection of external nose and ears  NECK: normal movements of the head and neck  LUNGS: on inspection no signs of respiratory distress, breathing rate appears normal, no obvious gross SOB, gasping or wheezing  CV: no obvious cyanosis  MS: moves all visible extremities without noticeable abnormality  PSYCH/NEURO: pleasant and cooperative, no obvious depression or anxiety, speech and thought processing grossly intact  ASSESSMENT AND PLAN:  Discussed the following assessment and plan:  Problem List Items Addressed This Visit    Cough    Suspect viral respiratory illness.  Discussed  this could be Covid versus RSV versus other viral illness.  Discussed Covid testing.  Advised to stay quarantined at home until we get a negative test result.  Discussed supportive care.  Tussionex will be prescribed for her cough.  Given her wheezing and asthma history we will treat with prednisone 40 mg once daily for 5 days.  If she has worsening symptoms she will contact us immediately.      Relevant Medications   HYDROcodone-homatropine (HYCODAN) 5-1.5 MG/5ML syrup   predniSONE (DELTASONE) 20 MG tablet   Other Relevant Orders   COVID-19, Flu A+B and RSV       I discussed the assessment and treatment plan with the patient. The patient was provided an opportunity to ask questions and all were answered. The patient agreed with the plan and demonstrated an understanding of the instructions.   The patient was advised to call back or seek an in-person evaluation if the symptoms worsen or if the condition fails to improve as anticipated.   Tommi Rumps, MD

## 2020-02-26 NOTE — Assessment & Plan Note (Signed)
Suspect viral respiratory illness.  Discussed this could be Covid versus RSV versus other viral illness.  Discussed Covid testing.  Advised to stay quarantined at home until we get a negative test result.  Discussed supportive care.  Tussionex will be prescribed for her cough.  Given her wheezing and asthma history we will treat with prednisone 40 mg once daily for 5 days.  If she has worsening symptoms she will contact us immediately.

## 2020-02-27 LAB — COVID-19, FLU A+B AND RSV
Influenza A, NAA: NOT DETECTED
Influenza B, NAA: NOT DETECTED
RSV, NAA: NOT DETECTED
SARS-CoV-2, NAA: NOT DETECTED

## 2020-03-01 ENCOUNTER — Telehealth: Payer: Self-pay | Admitting: Pulmonary Disease

## 2020-03-01 NOTE — Telephone Encounter (Signed)
Patient is aware of below recommendations.  She voiced her understanding and had no further questions.  Nothing further needed.  

## 2020-03-01 NOTE — Telephone Encounter (Signed)
Spoke to patient via telephone.   Patient would like order for new trach tube. Her current tube has been replaced with a new tube. New tube is not meeting her needs, it sticks out a few inches. New tube is [id number].  She has not contacted ENT for new trach tub. I have recommend that she do so.   Dr. Patsey Berthold, please advise if you would like to manage trach supplies or ENT to do so.

## 2020-03-01 NOTE — Telephone Encounter (Signed)
I prefer that ENT continue to manage trach supplies.  She needs to have continued follow-up with them.

## 2020-03-02 ENCOUNTER — Telehealth: Payer: Self-pay | Admitting: Family Medicine

## 2020-03-02 NOTE — Telephone Encounter (Signed)
Pt had a VV with Dr. [name] on 02/26/20 she is still having congestion and a sever wet cough and is wanting to know what else she can do  Pt stated that she is out of the cough med

## 2020-03-02 NOTE — Telephone Encounter (Signed)
Pt had a VV with Dr. [name] on 02/26/20 she is still having congestion and a sever wet cough and is wanting to know what else she can do  Pt stated that she is out of the cough med.  Abbi Mancini,cma

## 2020-03-03 MED ORDER — DOXYCYCLINE HYCLATE 100 MG PO TABS: 100.0000 mg | ORAL_TABLET | Freq: Two times a day (BID) | ORAL | 0 refills | Status: DC

## 2020-03-03 NOTE — Telephone Encounter (Signed)
I called and spoke with the patient and informed her that the provider sent in an antibiotic to her pharmacy for her to try and if she does not improve on that to let us know, she understood.  Lizandro Spellman,cma

## 2020-03-03 NOTE — Telephone Encounter (Signed)
At this point with persistent symptoms we should start her on an antibiotic. Doxycycline sent to pharmacy for this. If she does not improve on that please let me know.

## 2020-03-03 NOTE — Telephone Encounter (Signed)
Pt called back about what she can take to dry up congestion. Pt states that it is making her feel miserable. Please advise at earliest convenience. Thanks

## 2020-03-03 NOTE — Addendum Note (Signed)
Addended by: [name] on: 03/03/2020 03:48 PM   Modules accepted: Orders

## 2020-03-03 NOTE — Telephone Encounter (Signed)
Pt called back about what she can take to dry up congestion. Pt states that it is making her feel miserable. Please advise at earliest convenience. Thanks.  Junius Faucett,cma

## 2020-03-09 ENCOUNTER — Other Ambulatory Visit: Payer: Self-pay | Admitting: Family Medicine

## 2020-03-10 ENCOUNTER — Ambulatory Visit (INDEPENDENT_AMBULATORY_CARE_PROVIDER_SITE_OTHER): Payer: Medicare Other

## 2020-03-10 ENCOUNTER — Other Ambulatory Visit: Payer: Self-pay

## 2020-03-10 DIAGNOSIS — Z23 Encounter for immunization: Secondary | ICD-10-CM | POA: Diagnosis not present

## 2020-03-10 NOTE — Progress Notes (Signed)
   Covid-19 Vaccination Clinic  Name:  SHECCID LAHMANN    MRN: [id number] DOB: Jul 31, 1950  03/10/2020  Ms. Blankenburg was observed post Covid-19 immunization for 15 minutes without incident. She was provided with Vaccine Information Sheet and instruction to access the V-Safe system.   Ms. Regas was instructed to call 911 with any severe reactions post vaccine: [name] Difficulty breathing  . Swelling of face and throat  . A fast heartbeat  . A bad rash all over body  . Dizziness and weakness   Immunizations Administered    Name Date Dose VIS Date Route   JANSSEN COVID-19 VACCINE 03/10/2020 10:35 AM 0.5 mL 01/14/2020 Intramuscular   Manufacturer: [name]   Lot: [id number]   [place]: [id number]

## 2020-03-11 ENCOUNTER — Other Ambulatory Visit: Payer: Self-pay

## 2020-03-11 ENCOUNTER — Ambulatory Visit (INDEPENDENT_AMBULATORY_CARE_PROVIDER_SITE_OTHER): Payer: Medicare Other

## 2020-03-11 DIAGNOSIS — E538 Deficiency of other specified B group vitamins: Secondary | ICD-10-CM | POA: Diagnosis not present

## 2020-03-11 MED ORDER — CYANOCOBALAMIN 1000 MCG/ML IJ SOLN
1000.0000 ug | Freq: Once | INTRAMUSCULAR | Status: AC
  Administered 2020-03-11: 1000 ug via INTRAMUSCULAR

## 2020-03-11 NOTE — Progress Notes (Signed)
Patient presented for B 12 injection to right deltoid, patient voiced no concerns nor showed any signs of distress during injection. 

## 2020-03-13 ENCOUNTER — Other Ambulatory Visit: Payer: Self-pay | Admitting: Family Medicine

## 2020-03-23 ENCOUNTER — Other Ambulatory Visit: Payer: Self-pay

## 2020-03-23 ENCOUNTER — Ambulatory Visit: Payer: Medicare Other | Admitting: Family Medicine

## 2020-03-25 ENCOUNTER — Ambulatory Visit: Payer: Medicare Other | Admitting: Family Medicine

## 2020-04-01 ENCOUNTER — Ambulatory Visit: Payer: Medicare Other | Admitting: Family Medicine

## 2020-04-02 ENCOUNTER — Encounter: Payer: Self-pay | Admitting: Family Medicine

## 2020-04-06 ENCOUNTER — Other Ambulatory Visit: Payer: Self-pay

## 2020-04-06 MED ORDER — ALBUTEROL SULFATE HFA 108 (90 BASE) MCG/ACT IN AERS: 2.0000 | INHALATION_SPRAY | Freq: Four times a day (QID) | RESPIRATORY_TRACT | 2 refills | Status: DC | PRN

## 2020-04-06 NOTE — Telephone Encounter (Signed)
Rx for albuterol has been sent to [facility] per pharmacy request.

## 2020-04-06 NOTE — Telephone Encounter (Signed)
Routing to Dr. Gonzalez as an FYI 

## 2020-04-07 ENCOUNTER — Other Ambulatory Visit: Payer: Self-pay | Admitting: Family Medicine

## 2020-04-07 NOTE — Telephone Encounter (Signed)
Best thing to do is to use her Pulmicort (budesonide) nebulizer twice a day, religiously.  Budesonide does help with COVID symptoms.  Additionally if she has a stuffy nose she can switch Benadryl to Zyrtec 10 mg daily.  Zyrtec is also been helpful with COVID symptoms of nasal congestion.

## 2020-04-13 ENCOUNTER — Ambulatory Visit: Payer: Medicare Other

## 2020-04-15 ENCOUNTER — Other Ambulatory Visit: Payer: Self-pay

## 2020-04-15 ENCOUNTER — Telehealth: Payer: Self-pay | Admitting: Family Medicine

## 2020-04-15 NOTE — Telephone Encounter (Signed)
I called the patient and informed her that she could come into the office for her appointment because it has been 14 days since she tested positive.  Gaberiel Youngblood,cma

## 2020-04-15 NOTE — Telephone Encounter (Signed)
Patient still have mild symptom from covid do you want her to come in on Monday for her appt she stated that it is mild she has to have B12 shot also

## 2020-04-17 ENCOUNTER — Other Ambulatory Visit: Payer: Self-pay | Admitting: Family Medicine

## 2020-04-19 ENCOUNTER — Encounter: Payer: Self-pay | Admitting: Family Medicine

## 2020-04-19 ENCOUNTER — Ambulatory Visit (INDEPENDENT_AMBULATORY_CARE_PROVIDER_SITE_OTHER): Payer: Medicare Other | Admitting: Family Medicine

## 2020-04-19 ENCOUNTER — Other Ambulatory Visit: Payer: Self-pay

## 2020-04-19 VITALS — BP 118/62 | HR 64 | Temp 97.8°F | Ht 63.5 in | Wt 103.6 lb

## 2020-04-19 DIAGNOSIS — L603 Nail dystrophy: Secondary | ICD-10-CM | POA: Diagnosis not present

## 2020-04-19 DIAGNOSIS — E538 Deficiency of other specified B group vitamins: Secondary | ICD-10-CM

## 2020-04-19 DIAGNOSIS — Z8616 Personal history of COVID-19: Secondary | ICD-10-CM | POA: Diagnosis not present

## 2020-04-19 DIAGNOSIS — M67431 Ganglion, right wrist: Secondary | ICD-10-CM | POA: Insufficient documentation

## 2020-04-19 DIAGNOSIS — N951 Menopausal and female climacteric states: Secondary | ICD-10-CM | POA: Diagnosis not present

## 2020-04-19 MED ORDER — CYANOCOBALAMIN 1000 MCG/ML IJ SOLN
1000.0000 ug | Freq: Once | INTRAMUSCULAR | Status: AC
  Administered 2020-04-19: 1000 ug via INTRAMUSCULAR

## 2020-04-19 NOTE — Assessment & Plan Note (Signed)
Patient recovering fairly well given the recency of her infection.  Discussed it may take weeks to months to fully recover.  Advised if she does not continue to improve slowly she should let us know.  She will let us know if any of her symptoms worsen.

## 2020-04-19 NOTE — Assessment & Plan Note (Signed)
She will monitor.  If it enlarges or becomes painful she will let us know.

## 2020-04-19 NOTE — Assessment & Plan Note (Signed)
Patient has previously tried to taper off though had recurrence of hot flashes.  She understands the risk of breast cancer, stroke, heart attack, blood clot.  She is willing to continue on Estrace at this time.

## 2020-04-19 NOTE — Patient Instructions (Signed)
Nice to see you. We are going to get you to see dermatology. Please monitor your symptoms post Covid and if you have any worsening symptoms or if they are not improving please let us know.

## 2020-04-19 NOTE — Progress Notes (Signed)
Tommi Rumps, MD Phone: [phone]  Anne [name] is a 70 y.o. female who presents today for f/u.  COVID-19: Patient notes she tested positive with a rapid test though ended up having a follow-up PCR test more than a week after onset of symptoms that turned negative.  She notes she feels as though she is doing okay though does still have some tiredness and feels jittery.  She feels jumbled and shaky at times.  She has less patience with others.  She is easier to cry.  She is not sleeping well.  She does note the congestion has improved though she does still have some swelling in her sinuses.  Her smell is back to her taste is not all the way back.  PHQ-2 of 1.  GAD-7 score of 4.  Menopausal symptoms: Patient has a history of night sweats.  Every time she tried to come off of Estrace she develops recurrent night sweats that are quite difficult to deal with.  She does have a family history of breast cancer.  Ganglion cyst: This is on her right radial aspect wrist.  There is no pain.  No injury.  Fingernail splitting: Notes this has been going on for some time.  She does wash a lot of dishes.  Gets some cracking in her skin and in her fingernails.  Social History   Tobacco Use  Smoking Status Never Smoker  Smokeless Tobacco Never Used    Current Outpatient Medications on File Prior to Visit  Medication Sig Dispense Refill  . acetaminophen (TYLENOL) 500 MG tablet Take 500 mg by mouth 2 (two) times daily as needed for moderate pain or headache.     . albuterol (VENTOLIN HFA) 108 (90 Base) MCG/ACT inhaler Inhale 2 puffs into the lungs every 6 (six) hours as needed for wheezing or shortness of breath. 18 g 2  . azelastine (ASTELIN) 0.1 % nasal spray Place 1 spray into both nostrils 2 (two) times daily. Use in each nostril as directed 30 mL 6  . budesonide (PULMICORT) 0.25 MG/2ML nebulizer solution Take 2 mLs (0.25 mg total) by nebulization 2 (two) times daily. Use after DuoNeb 120 mL 11   . cetirizine (ZYRTEC) 10 MG tablet TAKE 1 TABLET BY MOUTH EVERY DAY (NOT COVERED 90 tablet 0  . clopidogrel (PLAVIX) 75 MG tablet TAKE 1 TABLET BY MOUTH EVERY DAY 90 tablet 1  . diphenhydrAMINE (BENADRYL) 25 MG tablet Take 25 mg by mouth daily as needed for allergies.     [name] doxycycline (VIBRA-TABS) 100 MG tablet Take 1 tablet (100 mg total) by mouth 2 (two) times daily. 14 tablet 0  . EPINEPHrine 0.3 mg/0.3 mL IJ SOAJ injection Inject 0.3 mg into the muscle as needed for anaphylaxis.     [name] estradiol (ESTRACE) 1 MG tablet TAKE 1 TABLET BY MOUTH EVERY DAY 90 tablet 1  . fluticasone (FLONASE) 50 MCG/ACT nasal spray PLACE 2 SPRAYS INTO BOTH NOSTRILS DAILY. (Patient taking differently: Place 2 sprays into both nostrils daily as needed for allergies.) 16 g 2  . furosemide (LASIX) 20 MG tablet TAKE ONE TABLET DAILY AS NEEDED. 90 tablet 1  . HYDROcodone-homatropine (HYCODAN) 5-1.5 MG/5ML syrup Take 5 mLs by mouth every 8 (eight) hours as needed for cough. 120 mL 0  . ibuprofen (ADVIL,MOTRIN) 200 MG tablet Take 400-800 mg by mouth daily as needed for headache or moderate pain.    [name] ipratropium-albuterol (DUONEB) 0.5-2.5 (3) MG/3ML SOLN Take 3 mLs by nebulization every 4 (four) hours  as needed. Dx 496 (Patient taking differently: Take 3 mLs by nebulization every 4 (four) hours as needed (wheezing or shortness of breath). Dx 496) 120 mL 2  . metoCLOPramide (REGLAN) 5 MG tablet Take 1 tablet (5 mg total) by mouth 3 (three) times daily with meals as needed for nausea. 60 tablet 0  . metoprolol succinate (TOPROL XL) 25 MG 24 hr tablet Take 0.5 tablets (12.5 mg total) by mouth daily. 45 tablet 3  . montelukast (SINGULAIR) 10 MG tablet TAKE 1 TABLET BY MOUTH EVERYDAY AT BEDTIME 90 tablet 1  . mupirocin ointment (BACTROBAN) 2 % Apply 1 application topically daily as needed (FOR [facility] SITE IRRITATION).     . Naphazoline-Pheniramine 0.027-0.315 % SOLN Place 1 drop into both eyes daily as needed (allergies).     .  pantoprazole (PROTONIX) 40 MG tablet TAKE ONE TABLET EVERY DAY NEEDS FOR OFFICE USE VISIT FOR MORE REFILLS. 90 tablet 2  . potassium chloride SA (K-DUR) 20 MEQ tablet Take 2 tablets (40 mEq total) by mouth daily. 6 tablet 0  . predniSONE (DELTASONE) 20 MG tablet Take 2 tablets (40 mg total) by mouth daily with breakfast. 10 tablet 0  . predniSONE (DELTASONE) 5 MG tablet Take by mouth.     . rizatriptan (MAXALT-MLT) 10 MG disintegrating tablet TAKE 1 TABLET (10 MG TOTAL) BY MOUTH DAILY AS NEEDED FOR MIGRAINE. MAY REPEAT FOR ONE DOSE 2 HOURS AFTER THE FIRST ONE IF NEEDED. 10 tablet 0  . sodium chloride (OCEAN) 0.65 % SOLN nasal spray Place 1 spray into both nostrils every 4 (four) hours as needed for congestion.     . sucralfate (CARAFATE) 1 g tablet Take 1 tablet (1 g total) by mouth 4 (four) times daily. 360 tablet 0  . traMADol (ULTRAM) 50 MG tablet 1/2-1 po bid prn    . White Petrolatum-Mineral Oil (LUBRICANT EYE) OINT Place 1 application into both eyes 2 (two) times daily as needed (for dry eyes).     . [DISCONTINUED] zolmitriptan (ZOMIG-ZMT) 5 MG disintegrating tablet Take 1 tablet (5 mg total) by mouth daily as needed for migraine. 10 tablet 0   No current facility-administered medications on file prior to visit.     ROS see history of present illness  Objective  Physical Exam Vitals:   04/19/20 1451  BP: 118/62  Pulse: 64  Temp: 97.8 F (36.6 C)  SpO2: 97%    BP Readings from Last 3 Encounters:  04/19/20 118/62  01/29/20 100/62  01/14/20 (!) 112/50   Wt Readings from Last 3 Encounters:  04/19/20 103 lb 9.6 oz (47 kg)  02/26/20 103 lb (46.7 kg)  01/29/20 103 lb 12.8 oz (47.1 kg)    Physical Exam Constitutional:      General: She is not in acute distress.    Appearance: She is not diaphoretic.  Cardiovascular:     Rate and Rhythm: Normal rate and regular rhythm.     Heart sounds: Normal heart sounds.  Pulmonary:     Effort: Pulmonary effort is normal.     Breath  sounds: Normal breath sounds.  Musculoskeletal:        General: No edema.     Comments: Ganglion cyst right radial wrist, nontender  Skin:    General: Skin is warm and dry.     Comments: Fingernails appear thin  Neurological:     Mental Status: She is alert.      Assessment/Plan: Please see individual problem list.  Problem List Items Addressed  This Visit    Ganglion cyst of wrist, right    She will monitor.  If it enlarges or becomes painful she will let us know.      History of COVID-19    Patient recovering fairly well given the recency of her infection.  Discussed it may take weeks to months to fully recover.  Advised if she does not continue to improve slowly she should let us know.  She will let us know if any of her symptoms worsen.      Menopausal symptom    Patient has previously tried to taper off though had recurrence of hot flashes.  She understands the risk of breast cancer, stroke, heart attack, blood clot.  She is willing to continue on Estrace at this time.      Nontraumatic nail splitting - Primary    Refer to dermatology.  I encouraged her to take a break from washing dishes to see if that would help.      Relevant Orders   Ambulatory referral to Dermatology       This visit occurred during the SARS-CoV-2 public health emergency.  Safety protocols were in place, including screening questions prior to the visit, additional usage of staff PPE, and extensive cleaning of exam room while observing appropriate contact time as indicated for disinfecting solutions.   I have spent 34 minutes in the care of this patient regarding history taking, documentation, completing physical exam, discussion of COVID-19 and recovery from infection.   Tommi Rumps, MD [place]

## 2020-04-19 NOTE — Assessment & Plan Note (Addendum)
Refer to dermatology.  I encouraged her to take a break from washing dishes to see if that would help.

## 2020-04-26 ENCOUNTER — Telehealth: Payer: Self-pay | Admitting: Family Medicine

## 2020-04-26 NOTE — Telephone Encounter (Signed)
Spoken to patient, she stated she has had nasal congestion since she had COVID. She figured it would go away but has not. She is having clear nasal discharge, slight cough from PND, and sinus pressure. NO fever, chills, nausea, vomiting, and headache. She would like to know what else can she take. She has taken flonase, tylenol, and congestion medication.

## 2020-04-26 NOTE — Telephone Encounter (Signed)
Pt saw Dr. [name] on the 24th.. She is not feeling any better and wanted to know what she needed to do.. She still having a cough and stuffy nose

## 2020-04-27 NOTE — Telephone Encounter (Signed)
Patients husband stated She does take zyrtec and singular and Flonase everyday. She hates to use the Flonase so much, because it dries her nose out really bad.(described the Nares area)

## 2020-04-27 NOTE — Telephone Encounter (Addendum)
Noted.  Has she been taking Zyrtec as well?  If not she could try that.  Has she been using 2 sprays of Flonase in each nostril each day?  Has she been taking her Singulair?

## 2020-04-28 NOTE — Telephone Encounter (Signed)
Noted. Has she also been using the astelin nasal spray? If so, there is not a lot of other options. She could try adding mucinex to see if that will help with the congestion. Otherwise we could refer her to ENT to get their input.

## 2020-04-28 NOTE — Telephone Encounter (Signed)
Patient has been informed.

## 2020-05-08 ENCOUNTER — Other Ambulatory Visit: Payer: Self-pay | Admitting: Family Medicine

## 2020-05-08 DIAGNOSIS — N951 Menopausal and female climacteric states: Secondary | ICD-10-CM

## 2020-05-14 ENCOUNTER — Other Ambulatory Visit: Payer: Self-pay

## 2020-05-14 ENCOUNTER — Ambulatory Visit
Admission: RE | Admit: 2020-05-14 | Discharge: 2020-05-14 | Disposition: A | Discharge status: Home / Self Care | Payer: Medicare Other | Source: Ambulatory Visit | Attending: Internal Medicine | Admitting: Internal Medicine

## 2020-05-14 ENCOUNTER — Encounter: Payer: Self-pay | Admitting: Internal Medicine

## 2020-05-14 ENCOUNTER — Telehealth (INDEPENDENT_AMBULATORY_CARE_PROVIDER_SITE_OTHER): Payer: Medicare Other | Admitting: Internal Medicine

## 2020-05-14 VITALS — Ht 63.5 in | Wt 103.0 lb

## 2020-05-14 DIAGNOSIS — J8410 Pulmonary fibrosis, unspecified: Secondary | ICD-10-CM

## 2020-05-14 DIAGNOSIS — J45901 Unspecified asthma with (acute) exacerbation: Secondary | ICD-10-CM | POA: Diagnosis not present

## 2020-05-14 DIAGNOSIS — R059 Cough, unspecified: Secondary | ICD-10-CM | POA: Diagnosis not present

## 2020-05-14 MED ORDER — DOXYCYCLINE HYCLATE 100 MG PO TABS: 100.0000 mg | ORAL_TABLET | Freq: Two times a day (BID) | ORAL | 0 refills | Status: DC

## 2020-05-14 MED ORDER — PREDNISONE 20 MG PO TABS: 40.0000 mg | ORAL_TABLET | Freq: Every day | ORAL | 0 refills | Status: DC

## 2020-05-14 MED ORDER — GUAIFENESIN-CODEINE 100-10 MG/5ML PO SOLN: 5.0000 mL | Freq: Two times a day (BID) | ORAL | 0 refills | Status: DC | PRN

## 2020-05-14 NOTE — Progress Notes (Signed)
Virtual Visit via Video Note  I connected with Anne [name]  on 05/14/20 at  2:45 PM EST by a video enabled telemedicine application and verified that I am speaking with the correct person using two identifiers.  Location patient: home, [place] Location provider:work or home office Persons participating in the virtual visit: patient, provider, pts family  I discussed the limitations of evaluation and management by telemedicine and the availability of in person appointments. The patient expressed understanding and agreed to proceed.   HPI: 03/31/20 had covid also she has h/o pulmonary fibrosis and asthma/allergies. She had family and grandkids over Sunday for the superbowl and afterwards Sunday night she felt bad. She is hoarse with body aches, sore throat, nasal and chest congestion and productive cough. She had 1 episode of nausea and vomiting Sunday which resolved. She tried sudafed, otc cold and flu and robitussin dm, tea and cough drops otc and leftover tussionex she also has albuterol inhaler and pulmicort/duoneb txs. 1 of grandkids tested + covid 19 and was at her house Sunday and she did also go to church weds.  She contracted covid 19 03/31/20 from another grand kids but she reports sob now is less than when she had covid before.   -COVID-19 vaccine status:J&J x 1 and 1 pfizer  ROS: See pertinent positives and negatives per HPI.  Past Medical History:  Diagnosis Date  . Allergic rhinitis   . Allergy    multiple, mostly aspirin, levaquin and shellfish.  . Anemia   . Asthma   . Cataract 2014   exraction with len implant  . Complication of anesthesia    Breathing problems upon waking up. 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  . Compressed cervical disc   . COPD (chronic obstructive pulmonary disease) ([place])   . CVA (cerebral infarction)   . Dyspnea   . Esophageal dysmotility   . Heart murmur    as child  . History of hiatal hernia   . IBS (irritable bowel  syndrome)   . Migraine   . PICC (peripherally inserted central catheter) removal 02/20/2017  . PONV (postoperative nausea and vomiting)   . Problems with swallowing    intermittently  . Pulmonary fibrosis ([place])   . Shingles   . Stroke [facility])    slurred speech, drawn face, imaging normal, occurred twice, [facility]-"TIA" if antything" 02/20/17-no residual effects  . Tracheostomy in place [facility])   . Vocal cord paresis     Past Surgical History:  Procedure Laterality Date  . ABDOMINAL HYSTERECTOMY    . APPLICATION OF A-CELL OF HEAD/NECK N/A 02/21/2017   Procedure: APPLICATION OF A-CELL OF HEAD/NECK;  Surgeon: Wallace Going, DO;  Location: [place];  Service: Plastics;  Laterality: N/A;  . BOTOX INJECTION N/A 07/29/2013   Procedure: BOTOX INJECTION;  Surgeon: Jerene Bears, MD;  [facility];  Service: Gastroenterology;  Laterality: N/A;  . BOTOX INJECTION N/A 05/04/2015   Procedure: BOTOX INJECTION;  Surgeon: Jerene Bears, MD;  [facility];  Service: Gastroenterology;  Laterality: N/A;  . BOTOX INJECTION N/A 02/18/2018   Procedure: BOTOX INJECTION;  Surgeon: Jerene Bears, MD;  [facility];  Service: Gastroenterology;  Laterality: N/A;  . BOTOX INJECTION N/A 06/30/2019   Procedure: BOTOX INJECTION;  Surgeon: Jerene Bears, MD;  [facility];  Service: Gastroenterology;  Laterality: N/A;  . BREAST BIOPSY Left    neg  . BREAST SURGERY Left 2002   bx of skin  .  CHOLECYSTECTOMY    . COLONOSCOPY    . ESOPHAGEAL MANOMETRY N/A 12/16/2012   Procedure: ESOPHAGEAL MANOMETRY (EM);  Surgeon: Jerene Bears, MD;  [facility];  Service: Gastroenterology;  Laterality: N/A;  . ESOPHAGOGASTRODUODENOSCOPY (EGD) WITH PROPOFOL N/A 07/29/2013   Procedure: ESOPHAGOGASTRODUODENOSCOPY (EGD) WITH PROPOFOL;  Surgeon: Jerene Bears, MD;  [facility];  Service: Gastroenterology;  Laterality: N/A;  with botox injection  . ESOPHAGOGASTRODUODENOSCOPY (EGD) WITH  PROPOFOL N/A 05/04/2015   Procedure: ESOPHAGOGASTRODUODENOSCOPY (EGD) WITH PROPOFOL;  Surgeon: Jerene Bears, MD;  [facility];  Service: Gastroenterology;  Laterality: N/A;  . ESOPHAGOGASTRODUODENOSCOPY (EGD) WITH PROPOFOL N/A 02/18/2018   Procedure: ESOPHAGOGASTRODUODENOSCOPY (EGD) WITH PROPOFOL;  Surgeon: Jerene Bears, MD;  [facility];  Service: Gastroenterology;  Laterality: N/A;  . ESOPHAGOGASTRODUODENOSCOPY (EGD) WITH PROPOFOL N/A 06/30/2019   Procedure: ESOPHAGOGASTRODUODENOSCOPY (EGD) WITH PROPOFOL;  Surgeon: Jerene Bears, MD;  [facility];  Service: Gastroenterology;  Laterality: N/A;  . EYE SURGERY     Catarct surgery 2014  . Eye Surgery AS Child Left   . INCISION AND DRAINAGE OF WOUND N/A 02/21/2017   Procedure: IRRIGATION AND DEBRIDEMENT WOUND NECK;  Surgeon: Wallace Going, DO;  Location: [place];  Service: Plastics;  Laterality: N/A;  . JEJUNOSTOMY FEEDING TUBE     x2 both failed. no longer has  . LUMBAR LAMINECTOMY/DECOMPRESSION MICRODISCECTOMY Bilateral 05/13/2018   Procedure: Laminectomy and Foraminotomy - Lumbar four-Lumbar five- bilateral;  Surgeon: Eustace Moore, MD;  Location: [place];  Service: Neurosurgery;  Laterality: Bilateral;  . MULTIPLE TOOTH EXTRACTIONS     2 teeth removed  . OOPHORECTOMY    . POSTERIOR CERVICAL FUSION/FORAMINOTOMY N/A 01/10/2017   Procedure: LAMINECTOMY AND FORAMINOTOMY CERVICAL FOUR-CERVICAL FIVE, CERVICAL FIVE-SIX POSTERIOR CERVICAL INSTRUMENT FUSION CERVICAL THREE-CERVICAL SEVEN,CERVICAL LAMINECTOMY CERVICAL THREE-CERVICAL SEVEN.;  Surgeon: Eustace Moore, MD;  Location: [place];  Service: Neurosurgery;  Laterality: N/A;  posterior  . TRACHEOSTOMY  1996   done at [facility], Dr. Kathyrn Sheriff  . TUBAL LIGATION    . VIDEO BRONCHOSCOPY Bilateral 11/20/2012   Procedure: VIDEO BRONCHOSCOPY WITH FLUORO;  Surgeon: Juanito Doom, MD;  Location[facility];  Service: Cardiopulmonary;  Laterality: Bilateral;     Current  Outpatient Medications:  .  acetaminophen (TYLENOL) 500 MG tablet, Take 500 mg by mouth 2 (two) times daily as needed for moderate pain or headache. , Disp: , Rfl:  .  albuterol (VENTOLIN HFA) 108 (90 Base) MCG/ACT inhaler, Inhale 2 puffs into the lungs every 6 (six) hours as needed for wheezing or shortness of breath., Disp: 18 g, Rfl: 2 .  budesonide (PULMICORT) 0.25 MG/2ML nebulizer solution, Take 2 mLs (0.25 mg total) by nebulization 2 (two) times daily. Use after DuoNeb, Disp: 120 mL, Rfl: 11 .  cetirizine (ZYRTEC) 10 MG tablet, TAKE 1 TABLET BY MOUTH EVERY DAY (NOT COVERED, Disp: 90 tablet, Rfl: 0 .  clopidogrel (PLAVIX) 75 MG tablet, TAKE 1 TABLET BY MOUTH EVERY DAY, Disp: 90 tablet, Rfl: 1 .  diphenhydrAMINE (BENADRYL) 25 MG tablet, Take 25 mg by mouth daily as needed for allergies. , Disp: , Rfl:  .  EPINEPHrine 0.3 mg/0.3 mL IJ SOAJ injection, Inject 0.3 mg into the muscle as needed for anaphylaxis. , Disp: , Rfl:  .  estradiol (ESTRACE) 1 MG tablet, TAKE 1 TABLET BY MOUTH EVERY DAY, Disp: 90 tablet, Rfl: 1 .  fluticasone (FLONASE) 50 MCG/ACT nasal spray, PLACE 2 SPRAYS INTO BOTH NOSTRILS DAILY. (Patient taking differently: Place 2  sprays into both nostrils daily as needed for allergies.), Disp: 16 g, Rfl: 2 .  furosemide (LASIX) 20 MG tablet, TAKE ONE TABLET DAILY AS NEEDED., Disp: 90 tablet, Rfl: 1 .  guaiFENesin-codeine 100-10 MG/5ML syrup, Take 5 mLs by mouth 2 (two) times daily as needed for cough., Disp: 120 mL, Rfl: 0 .  ibuprofen (ADVIL,MOTRIN) 200 MG tablet, Take 400-800 mg by mouth daily as needed for headache or moderate pain., Disp: , Rfl:  .  ipratropium-albuterol (DUONEB) 0.5-2.5 (3) MG/3ML SOLN, Take 3 mLs by nebulization every 4 (four) hours as needed. Dx 496 (Patient taking differently: Take 3 mLs by nebulization every 4 (four) hours as needed (wheezing or shortness of breath). Dx 496), Disp: 120 mL, Rfl: 2 .  metoCLOPramide (REGLAN) 5 MG tablet, Take 1 tablet (5 mg total) by  mouth 3 (three) times daily with meals as needed for nausea., Disp: 60 tablet, Rfl: 0 .  metoprolol succinate (TOPROL XL) 25 MG 24 hr tablet, Take 0.5 tablets (12.5 mg total) by mouth daily., Disp: 45 tablet, Rfl: 3 .  montelukast (SINGULAIR) 10 MG tablet, TAKE 1 TABLET BY MOUTH EVERYDAY AT BEDTIME, Disp: 90 tablet, Rfl: 1 .  Naphazoline-Pheniramine 0.027-0.315 % SOLN, Place 1 drop into both eyes daily as needed (allergies). , Disp: , Rfl:  .  pantoprazole (PROTONIX) 40 MG tablet, TAKE ONE TABLET EVERY DAY NEEDS FOR OFFICE USE VISIT FOR MORE REFILLS., Disp: 90 tablet, Rfl: 2 .  potassium chloride SA (K-DUR) 20 MEQ tablet, Take 2 tablets (40 mEq total) by mouth daily., Disp: 6 tablet, Rfl: 0 .  predniSONE (DELTASONE) 20 MG tablet, Take 2 tablets (40 mg total) by mouth daily with breakfast. x5-10 days, Disp: 20 tablet, Rfl: 0 .  sodium chloride (OCEAN) 0.65 % SOLN nasal spray, Place 1 spray into both nostrils every 4 (four) hours as needed for congestion. , Disp: , Rfl:  .  sucralfate (CARAFATE) 1 g tablet, Take 1 tablet (1 g total) by mouth 4 (four) times daily., Disp: 360 tablet, Rfl: 0 .  White Petrolatum-Mineral Oil (LUBRICANT EYE) OINT, Place 1 application into both eyes 2 (two) times daily as needed (for dry eyes). , Disp: , Rfl:  .  azelastine (ASTELIN) 0.1 % nasal spray, Place 1 spray into both nostrils 2 (two) times daily. Use in each nostril as directed (Patient not taking: Reported on 05/14/2020), Disp: 30 mL, Rfl: 6 .  doxycycline (VIBRA-TABS) 100 MG tablet, Take 1 tablet (100 mg total) by mouth 2 (two) times daily. With food x7-10 days, Disp: 20 tablet, Rfl: 0 .  HYDROcodone-homatropine (HYCODAN) 5-1.5 MG/5ML syrup, Take 5 mLs by mouth every 8 (eight) hours as needed for cough. (Patient not taking: Reported on 05/14/2020), Disp: 120 mL, Rfl: 0 .  mupirocin ointment (BACTROBAN) 2 %, Apply 1 application topically daily as needed (FOR [facility] SITE IRRITATION).  (Patient not taking: Reported on  05/14/2020), Disp: , Rfl:  .  rizatriptan (MAXALT-MLT) 10 MG disintegrating tablet, TAKE 1 TABLET (10 MG TOTAL) BY MOUTH DAILY AS NEEDED FOR MIGRAINE. MAY REPEAT FOR ONE DOSE 2 HOURS AFTER THE FIRST ONE IF NEEDED. (Patient not taking: Reported on 05/14/2020), Disp: 10 tablet, Rfl: 0 .  traMADol (ULTRAM) 50 MG tablet, 1/2-1 po bid prn (Patient not taking: Reported on 05/14/2020), Disp: , Rfl:   EXAM:  VITALS per patient if applicable:  GENERAL: alert, oriented, appears well and in no acute distress  HEENT: atraumatic, conjunttiva clear, no obvious abnormalities on inspection of external nose  and ears  NECK: normal movements of the head and neck  LUNGS: on inspection no signs of respiratory distress, breathing rate appears normal, no obvious gross SOB, gasping or wheezing +coughing on exam appears dry cough   CV: no obvious cyanosis  MS: moves all visible extremities without noticeable abnormality  PSYCH/NEURO: pleasant and cooperative, no obvious depression or anxiety, speech and thought processing grossly intact  ASSESSMENT AND PLAN:  Discussed the following assessment and plan:  Cough could be multifactorial r/o covid 19 with recently exposure, or this could be flare of pulmonary fibrosis/asthma   cxr  FINDINGS: Tracheostomy tube terminates over the tracheal air column at the thoracic inlet 3.0 cm above the carina. Partially visualized posterior spinal fusion hardware in the lower cervical spine. Surgical clips in the medial upper abdomen. Stable cardiomediastinal silhouette with normal heart size. No pneumothorax. No pleural effusion. Chronic severe patchy reticular opacities and pleural thickening at both lung apices with associated volume loss and distortion, unchanged. No superimposed acute consolidative airspace disease.  IMPRESSION: Chronic severe biapical pleural-parenchymal scarring, unchanged. No superimposed acute consolidative airspace disease.  Well-positioned tracheostomy tube.    Pulmonary fibrosis ([place]) - Plan: DG Chest 2 View, guaiFENesin-codeine 100-10 MG/5ML syrup, predniSONE (DELTASONE) 20 MG tablet, doxycycline (VIBRA-TABS) 100 MG tablet, DISCONTINUED: doxycycline (VIBRA-TABS) 100 MG table Mild asthma with exacerbation, unspecified whether persistent - Plan: DG Chest 2 View, guaiFENesin-codeine 100-10 MG/5ML syrup, predniSONE (DELTASONE) 20 MG tablet, doxycycline (VIBRA-TABS) 100 MG tablet, DISCONTINUED: doxycycline (VIBRA-TABS) 100 MG tablet She will go to [facility] to get tested also [facility], [facility] or [facility] for covid 19 testing    There is no medication other than over the counter meds:  Mucinex dm green label for cough.  Vitamin C 1000 mg daily.  Vitamin D3 4000 Iu (units) daily.  Zinc 100 mg daily.  Quercetin 250-500 mg 2 times per day   Elderberry  Oil of oregano  cepacol or chloroseptic spray for sore throat Warm tea with honey and lemon  Hydration  Try to eat though you dont feel like it   Tylenol or Advil  Nasal saline  Flonase   continue zyrtec for cough, sneezing, runny nose  Monitor pulse oximeter, buy from [facility] if oxygen is less than 90 please go to the hospital.        Are you feeling really sick? Shortness of breath, cough, chest pain?, dizziness? Confusion   If so let me know  If worsening, go to hospital or [facility] clinic Urgent care for further treatment     -we discussed possible serious and likely etiologies, options for evaluation and workup, limitations of telemedicine visit vs in person visit, treatment, treatment risks and precautions.      I discussed the assessment and treatment plan with the patient. The patient was provided an opportunity to ask questions and all were answered. The patient agreed with the plan and demonstrated an understanding of the instructions.    Time spent 20 min Delorise Jackson, MD

## 2020-05-14 NOTE — Patient Instructions (Signed)
There is no medication other than over the counter meds:  Mucinex dm green label for cough.  Vitamin C 1000 mg daily.  Vitamin D3 4000 Iu (units) daily.  Zinc 100 mg daily.  Quercetin 250-500 mg 2 times per day   Elderberry  Oil of oregano  cepacol or chloroseptic spray for sore throat Warm tea with honey and lemon  Hydration  Try to eat though you dont feel like it   Tylenol or Advil  Nasal saline  Flonase   continue zyrtec for cough, sneezing, runny nose  Monitor pulse oximeter, buy from [facility] if oxygen is less than 90 please go to the hospital.        Are you feeling really sick? Shortness of breath, cough, chest pain?, dizziness? Confusion   If so let me know  If worsening, go to hospital or [facility] clinic Urgent care for further treatment

## 2020-05-14 NOTE — Progress Notes (Signed)
Onset of symptoms Sunday night after left her house from a super bowl party. Felt nauseous that night and threw up.   Patient went to church on Wednesday and felt a bit better.   Symptoms continuing and got a bit worse. Had body aches that led to cough, sore throat, and chest and nasal congestion, and fatigue.   Has been taking tylenol and Sudafed, states Mucinex does not help.   Patient tested positive for COVID 03/31/20. Also has history of asthma. Patient's 4th grade granddaughter tested positive for Covid yesterday.

## 2020-05-21 ENCOUNTER — Ambulatory Visit: Payer: Medicare Other

## 2020-05-21 ENCOUNTER — Other Ambulatory Visit: Payer: Self-pay

## 2020-05-31 ENCOUNTER — Telehealth: Payer: Self-pay | Admitting: Family Medicine

## 2020-05-31 ENCOUNTER — Ambulatory Visit: Payer: Medicare Other

## 2020-05-31 ENCOUNTER — Other Ambulatory Visit: Payer: Self-pay

## 2020-05-31 ENCOUNTER — Ambulatory Visit (INDEPENDENT_AMBULATORY_CARE_PROVIDER_SITE_OTHER): Payer: Medicare Other

## 2020-05-31 DIAGNOSIS — N644 Mastodynia: Secondary | ICD-10-CM

## 2020-05-31 DIAGNOSIS — E538 Deficiency of other specified B group vitamins: Secondary | ICD-10-CM | POA: Diagnosis not present

## 2020-05-31 DIAGNOSIS — Z1231 Encounter for screening mammogram for malignant neoplasm of breast: Secondary | ICD-10-CM

## 2020-05-31 MED ORDER — CYANOCOBALAMIN 1000 MCG/ML IJ SOLN
1000.0000 ug | Freq: Once | INTRAMUSCULAR | Status: AC
  Administered 2020-05-31: 1000 ug via INTRAMUSCULAR

## 2020-05-31 NOTE — Telephone Encounter (Signed)
Mammogram ordered. She can call to schedule this.

## 2020-05-31 NOTE — Telephone Encounter (Signed)
Patient need a referral for mammogram.  Anne [name],cma

## 2020-05-31 NOTE — Progress Notes (Signed)
Patient presented for B 12 injection to left deltoid, patient voiced no concerns nor showed any signs of distress during injection.  Patient also wanted to request PCP to order her mammogram so she can make an appointment.

## 2020-05-31 NOTE — Telephone Encounter (Signed)
Patient need a referral for mammogram

## 2020-06-01 NOTE — Telephone Encounter (Signed)
[facility] called and asked the patient which breast her pain was in and she stated it was the left breast.  Cj Beecher,cma

## 2020-06-02 NOTE — Telephone Encounter (Signed)
Patient is scheduled for her Mammogram in April and she is scheduled with the provider in May, is this okay or should her visit be scheduled sooner?  Klaudia Beirne,cma

## 2020-06-02 NOTE — Telephone Encounter (Signed)
Orders placed for diagnostic mammogram.  She needs to be scheduled for an in office visit to have an exam as well.

## 2020-06-02 NOTE — Telephone Encounter (Signed)
With the breast pain both should be scheduled sooner.  I believe Anne [name] should be getting her diagnostic mammogram scheduled.  I would suggest a breast exam sometime in the next couple of weeks.

## 2020-06-07 NOTE — Telephone Encounter (Signed)
I called and spoke with the patient and she felt like the breast exam is not necessary, she stated the agent that scheduled the mammogram asked if she was experiencing any pain and she stated she does intermediate and it is not often and she has had theses pains on/off for years and its nothing new, she felt like the mammogram that they have always done is sufficient its just that it was the way she asked the question.  Tyjuan Demetro,cma

## 2020-06-07 NOTE — Telephone Encounter (Signed)
Noted. If there are any changes or new symptoms she should let us know. We will see what the mammogram reveals.

## 2020-06-09 ENCOUNTER — Other Ambulatory Visit: Payer: Self-pay | Admitting: Family Medicine

## 2020-06-11 ENCOUNTER — Other Ambulatory Visit: Payer: Self-pay

## 2020-06-11 ENCOUNTER — Ambulatory Visit
Admission: RE | Admit: 2020-06-11 | Discharge: 2020-06-11 | Disposition: A | Discharge status: Home / Self Care | Payer: Medicare Other | Source: Ambulatory Visit | Attending: Family Medicine | Admitting: Family Medicine

## 2020-06-11 DIAGNOSIS — N644 Mastodynia: Secondary | ICD-10-CM | POA: Insufficient documentation

## 2020-06-14 ENCOUNTER — Ambulatory Visit (INDEPENDENT_AMBULATORY_CARE_PROVIDER_SITE_OTHER): Payer: Medicare Other | Admitting: Pulmonary Disease

## 2020-06-14 ENCOUNTER — Other Ambulatory Visit: Payer: Self-pay

## 2020-06-14 ENCOUNTER — Other Ambulatory Visit
Admission: RE | Admit: 2020-06-14 | Discharge: 2020-06-14 | Disposition: A | Discharge status: Home / Self Care | Payer: Medicare Other | Attending: Pulmonary Disease | Admitting: Pulmonary Disease

## 2020-06-14 ENCOUNTER — Encounter: Payer: Self-pay | Admitting: Pulmonary Disease

## 2020-06-14 VITALS — BP 124/72 | HR 71 | Temp 97.0°F | Ht 63.5 in | Wt 105.8 lb

## 2020-06-14 DIAGNOSIS — J8410 Pulmonary fibrosis, unspecified: Secondary | ICD-10-CM

## 2020-06-14 DIAGNOSIS — Z930 Tracheostomy status: Secondary | ICD-10-CM | POA: Diagnosis not present

## 2020-06-14 DIAGNOSIS — Z148 Genetic carrier of other disease: Secondary | ICD-10-CM | POA: Insufficient documentation

## 2020-06-14 DIAGNOSIS — R0602 Shortness of breath: Secondary | ICD-10-CM | POA: Diagnosis not present

## 2020-06-14 NOTE — Progress Notes (Signed)
Subjective:    Patient ID: Anne [name], female    DOB: [birth year], 70 y.o.   MRN: [id number]  PULMONARY OFFICE FOLLOW UP NOTE  Chief Complaint  Patient presents with  . Follow-up    C/o sob with exertion and non prod cough. 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 No frank honeycombing. Findings have progressed since  2014. 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  INTERVAL HISTORY: Last visit  01/29/2020.   Had COVID January 2022 with no major issues.  She has had some persistent dyspnea since then. Suffered the death of her cousin to alpha 1 in the interim.  Issues with her trach.  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  Patient has a history of numerous allergies and intolerances to medications. She had previous anaphylacticoid reaction to [employer] vaccine.  She was however given the [employer] COVID-19 vaccine by her allergist it was done in increments and she was able to tolerate it.    She had event 6 in January 2022 and had a mild case.   She did start Pulmicort via nebulizer.  He has not had any fevers, chills or sweats, did not have fever with COVID.  Occasional cough, dry, no sputum production.  No hemoptysis.  She has noted some mild increased shortness of breath since her COVID.  She has had significant issues  with the tracheostomy and that the brand that she used to get has been discontinued and ENT has not been able to fit her well with a substitute.  She does not endorse any other issues today.   Review of Systems A 10 point review of systems was performed and it is as noted above otherwise negative.  Past Medical History:  Diagnosis Date  . Allergic rhinitis   . Allergy    multiple, mostly aspirin, levaquin and shellfish.  . Anemia   . Asthma   . Cataract 2014   exraction with len implant  . Complication of anesthesia    Breathing problems upon waking up. 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  . Compressed cervical disc   . COPD (chronic obstructive pulmonary disease) ([place])   . CVA (cerebral infarction)   . Dyspnea   . Esophageal dysmotility   . Heart murmur    as child  . History of hiatal hernia   . IBS (irritable bowel syndrome)   . Migraine   . PICC (peripherally inserted central catheter) removal 02/20/2017  . PONV (postoperative nausea and vomiting)   . Problems with swallowing    intermittently  . Pulmonary fibrosis ([place])   . Shingles   . Stroke [facility])    slurred speech, drawn face, imaging normal, occurred twice, [facility]-"TIA" if antything" 02/20/17-no residual effects  . Tracheostomy in place [facility])   . Vocal cord paresis    Allergies  Allergen Reactions  . Asa [Aspirin] Shortness Of Breath, Swelling and Other (See Comments)    Tongue swells  . Bee  Venom Anaphylaxis and Shortness Of Breath  . Epinephrine Shortness Of Breath and Palpitations    Occurs when pt is given double the dose, pt would use epipen as needed   . Feraheme [Ferumoxytol] Other (See Comments)    Throat squeezing  . Influenza Vaccines Shortness Of Breath and Other (See Comments)    Can take flu vaccines without eggs   . Pfizer-Biontech Covid-19 Vacc [Covid-19 Mrna Vacc (Moderna)] Anaphylaxis  . Quinolones Swelling and Other (See Comments)    Feet swell  . Shellfish  Allergy Anaphylaxis  . Ciprofloxacin Swelling and Other (See Comments)    ALL MEDS ENDING IN -FLOXACIN MAKE THE FEET SWELL  . Clarithromycin Nausea Only  . Erythromycin Nausea Only  . Levaquin [Levofloxacin In D5w] Swelling and Other (See Comments)    Feet and legs ache and SWELL  . Peanut-Containing Drug Products Other (See Comments)    Wheezing (boiled or raw peanuts)  . Septra [Sulfamethoxazole-Trimethoprim] Diarrhea  . Telithromycin Nausea Only  . Zanaflex [Tizanidine] Other (See Comments)    Caused the patient to feel "spaced out" and "not well"  . Adhesive [Tape] Rash and Other (See Comments)    Paper works tape works fine    Current Meds  Medication Sig  . acetaminophen (TYLENOL) 500 MG tablet Take 500 mg by mouth 2 (two) times daily as needed for moderate pain or headache.   . albuterol (VENTOLIN HFA) 108 (90 Base) MCG/ACT inhaler Inhale 2 puffs into the lungs every 6 (six) hours as needed for wheezing or shortness of breath.  [name] azelastine (ASTELIN) 0.1 % nasal spray Place 1 spray into both nostrils 2 (two) times daily. Use in each nostril as directed  . cetirizine (ZYRTEC) 10 MG tablet TAKE 1 TABLET BY MOUTH EVERY DAY (NOT COVERED  . clopidogrel (PLAVIX) 75 MG tablet TAKE 1 TABLET BY MOUTH EVERY DAY  . diphenhydrAMINE (BENADRYL) 25 MG tablet Take 25 mg by mouth daily as needed for allergies.   [name] EPINEPHrine 0.3 mg/0.3 mL IJ SOAJ injection Inject 0.3 mg into the muscle as needed for anaphylaxis.   [name] estradiol (ESTRACE) 1 MG tablet TAKE 1 TABLET BY MOUTH EVERY DAY  . fluticasone (FLONASE) 50 MCG/ACT nasal spray PLACE 2 SPRAYS INTO BOTH NOSTRILS DAILY. (Patient taking differently: Place 2 sprays into both nostrils daily as needed for allergies.)  . furosemide (LASIX) 20 MG tablet TAKE ONE TABLET DAILY AS NEEDED.  [name] guaiFENesin-codeine 100-10 MG/5ML syrup Take 5 mLs by mouth 2 (two) times daily as needed for cough.  [name] HYDROcodone-homatropine (HYCODAN) 5-1.5 MG/5ML syrup Take 5 mLs  by mouth every 8 (eight) hours as needed for cough.  [name] ibuprofen (ADVIL,MOTRIN) 200 MG tablet Take 400-800 mg by mouth daily as needed for headache or moderate pain.  [name] ipratropium-albuterol (DUONEB) 0.5-2.5 (3) MG/3ML SOLN Take 3 mLs by nebulization every 4 (four) hours as needed. Dx 496 (Patient taking differently: Take 3 mLs by nebulization every 4 (four) hours as needed (wheezing or shortness of breath). Dx 496)  . metoCLOPramide (REGLAN) 5 MG tablet Take 1 tablet (5 mg total) by mouth 3 (three) times daily with meals as needed for nausea.  . metoprolol succinate (TOPROL XL) 25 MG 24 hr tablet Take 0.5 tablets (12.5 mg total) by mouth daily.  . montelukast (SINGULAIR) 10 MG tablet TAKE 1 TABLET BY MOUTH EVERYDAY AT BEDTIME  . mupirocin ointment (BACTROBAN) 2 % Apply 1 application topically daily as needed (FOR [facility] SITE IRRITATION).  . Naphazoline-Pheniramine 0.027-0.315 %  SOLN Place 1 drop into both eyes daily as needed (allergies).   . pantoprazole (PROTONIX) 40 MG tablet TAKE ONE TABLET EVERY DAY NEEDS FOR OFFICE USE VISIT FOR MORE REFILLS.  [name] potassium chloride SA (K-DUR) 20 MEQ tablet Take 2 tablets (40 mEq total) by mouth daily.  . predniSONE (DELTASONE) 20 MG tablet Take 2 tablets (40 mg total) by mouth daily with breakfast. x5-10 days  . rizatriptan (MAXALT-MLT) 10 MG disintegrating tablet TAKE 1 TABLET (10 MG TOTAL) BY MOUTH DAILY AS NEEDED FOR MIGRAINE. MAY REPEAT FOR ONE DOSE 2 HOURS AFTER THE FIRST ONE IF NEEDED.  . sodium chloride (OCEAN) 0.65 % SOLN nasal spray Place 1 spray into both nostrils every 4 (four) hours as needed for congestion.   . sucralfate (CARAFATE) 1 g tablet Take 1 tablet (1 g total) by mouth 4 (four) times daily.  . traMADol (ULTRAM) 50 MG tablet   . White Petrolatum-Mineral Oil (LUBRICANT EYE) OINT Place 1 application into both eyes 2 (two) times daily as needed (for dry eyes).   . [DISCONTINUED] doxycycline (VIBRA-TABS) 100 MG tablet Take 1 tablet (100 mg  total) by mouth 2 (two) times daily. With food x7-10 days       Objective:   Physical Exam BP 124/72 (BP Location: Left Arm, Cuff Size: Normal)   Pulse 71   Temp (!) 97 F (36.1 C) (Temporal)   Ht 5' 3.5" (1.613 m)   Wt 105 lb 12.8 oz (48 kg)   SpO2 97%   BMI 18.45 kg/m  GENERAL: Well-developed, thin but not cachectic woman, in no acute distress.  She is fully ambulatory.  HEAD: Normocephalic, atraumatic.  EYES: Pupils equal, round, reactive to light.  No scleral icterus.  MOUTH: Nose/mouth/throat not examined due to masking requirements for COVID 19. NECK: Supple.Trachea midline. No JVD.  No adenopathy.  Tracheostomy tube in place. PULMONARY: Good air entry bilaterally.    Actually sounds fairly clear today. CARDIOVASCULAR: S1 and S2. Regular rate and rhythm.  No rubs, murmurs or gallops heard. ABDOMEN: Benign. MUSCULOSKELETAL: No joint deformity, no clubbing, no edema.  NEUROLOGIC: No focal deficit noted.  No gait disturbance.  Phonates well with tracheostomy with speech valve. SKIN: Limited exam shows no rashes. PSYCH: Mood and behavior normal.     Assessment & Plan:     ICD-10-CM   1. Shortness of breath  R06.02 ECHOCARDIOGRAM COMPLETE    CANCELED: ECHOCARDIOGRAM COMPLETE   Suspect issues with ill fitting tracheostomy Cannot exclude cardiac etiology Query pulmonary hypertension 2D echo  2. 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 Chronic tracheostomy tube  Z93.0 Ambulatory referral to Pulmonology   Referral to tracheostomy clinic as noted above    Orders Placed This Encounter  Procedures  . Alpha-1-antitrypsin    Standing Status:   Future    Number of Occurrences:   1    Standing Expiration  Date:   06/14/2021  . Ambulatory referral to Pulmonology    Referral Priority:   Routine    Referral Type:   Consultation    Referral Reason:   Specialty Services Required    Requested Specialty:   Pulmonary Disease    Number of Visits Requested:   1  . ECHOCARDIOGRAM COMPLETE  Standing Status:   Future    Number of Occurrences:   1    Standing Expiration Date:   12/15/2020    Order Specific Question:   Where should this test be performed    Answer:   [facility]    Order Specific Question:   Perflutren DEFINITY (image enhancing agent) should be administered unless hypersensitivity or allergy exist    Answer:   Administer Perflutren    Order Specific Question:   Reason for exam-Echo    Answer:   Dyspnea  R06.00   The patient will see [place] in follow-up in 4 months time she is to contact us prior to that time should any new difficulties arise.  We will notify her with the results of testing as they become available.  Renold Don, MD [facility]   *This note was dictated using voice recognition software/Dragon.  Despite best efforts to proofread, errors can occur which can change the meaning.  Any change was purely unintentional.

## 2020-06-14 NOTE — Patient Instructions (Signed)
We will refer you to our tracheostomy clinic in [place].  We are checking your alpha-1 level, your phenotype has been found to be MZ previously.   We are checking an echocardiogram given your recent increase in shortness of breath.  Your lungs sounded good today.  See you in follow-up in 4 months time call sooner should any new problems arise.

## 2020-06-15 LAB — ALPHA-1-ANTITRYPSIN: A-1 Antitrypsin, Ser: 105 mg/dL (ref 101–187)

## 2020-06-16 ENCOUNTER — Telehealth: Payer: Self-pay | Admitting: Internal Medicine

## 2020-06-16 NOTE — Telephone Encounter (Signed)
Pt states that she has been having issues swallowing again and wants to have another EGD. I offered an ov but since it is not until June, pt wants to know if she can schedule procedure directly. She stated that Dr. Hilarie Fredrickson knows about it and told her to just call if she needs another one. Pls advise.

## 2020-06-16 NOTE — Telephone Encounter (Signed)
See note below and advise. 

## 2020-06-17 NOTE — Telephone Encounter (Signed)
Hx of hypertensive LES, we can proceed with hospital EGD with plan for BOTOX to LES, next available on one of my [facility] mornings

## 2020-06-23 ENCOUNTER — Other Ambulatory Visit: Payer: Self-pay

## 2020-06-23 DIAGNOSIS — K220 Achalasia of cardia: Secondary | ICD-10-CM

## 2020-06-23 DIAGNOSIS — K224 Dyskinesia of esophagus: Secondary | ICD-10-CM

## 2020-06-23 DIAGNOSIS — R1319 Other dysphagia: Secondary | ICD-10-CM

## 2020-06-23 NOTE — Telephone Encounter (Signed)
Pt called to f/u on the message below. Pls call her.

## 2020-06-23 NOTE — Telephone Encounter (Signed)
Pt scheduled for covid testing 07/26/20@10 :45am. Pt scheduled for EGD with botox 08/05/20 at 10:30am, pt to arrive there at 9am and be NPO after midnight. Amb ref in epic. Pt aware.

## 2020-06-24 ENCOUNTER — Telehealth: Payer: Self-pay | Admitting: Internal Medicine

## 2020-06-24 NOTE — Telephone Encounter (Signed)
Patient has question regarding the Covid test appointment.

## 2020-06-24 NOTE — Telephone Encounter (Signed)
The pt has been advised of the date, time and location of the COVID test.  All questions answered.

## 2020-06-25 ENCOUNTER — Telehealth: Payer: Self-pay

## 2020-06-25 NOTE — Telephone Encounter (Signed)
Pt is aware of date/time of covid test prior to trach clinic visit.

## 2020-06-28 ENCOUNTER — Other Ambulatory Visit
Admission: RE | Admit: 2020-06-28 | Discharge: 2020-06-28 | Disposition: A | Discharge status: Home / Self Care | Payer: Medicare Other | Source: Ambulatory Visit | Attending: Pulmonary Disease | Admitting: Pulmonary Disease

## 2020-06-28 ENCOUNTER — Other Ambulatory Visit: Payer: Self-pay

## 2020-06-28 DIAGNOSIS — Z20822 Contact with and (suspected) exposure to covid-19: Secondary | ICD-10-CM | POA: Insufficient documentation

## 2020-06-28 DIAGNOSIS — Z01812 Encounter for preprocedural laboratory examination: Secondary | ICD-10-CM | POA: Insufficient documentation

## 2020-06-28 LAB — SARS CORONAVIRUS 2 (TAT 6-24 HRS): SARS Coronavirus 2: NEGATIVE

## 2020-06-30 ENCOUNTER — Ambulatory Visit (HOSPITAL_COMMUNITY)
Admission: RE | Admit: 2020-06-30 | Discharge: 2020-06-30 | Disposition: A | Discharge status: Home / Self Care | Payer: Medicare Other | Source: Ambulatory Visit | Attending: Pulmonary Disease | Admitting: Pulmonary Disease

## 2020-06-30 ENCOUNTER — Other Ambulatory Visit: Payer: Self-pay

## 2020-06-30 DIAGNOSIS — Z888 Allergy status to other drugs, medicaments and biological substances status: Secondary | ICD-10-CM | POA: Insufficient documentation

## 2020-06-30 DIAGNOSIS — Z7952 Long term (current) use of systemic steroids: Secondary | ICD-10-CM | POA: Diagnosis not present

## 2020-06-30 DIAGNOSIS — Z881 Allergy status to other antibiotic agents status: Secondary | ICD-10-CM | POA: Insufficient documentation

## 2020-06-30 DIAGNOSIS — J9509 Other tracheostomy complication: Secondary | ICD-10-CM | POA: Insufficient documentation

## 2020-06-30 DIAGNOSIS — J383 Other diseases of vocal cords: Secondary | ICD-10-CM

## 2020-06-30 DIAGNOSIS — Z887 Allergy status to serum and vaccine status: Secondary | ICD-10-CM | POA: Insufficient documentation

## 2020-06-30 DIAGNOSIS — J3800 Paralysis of vocal cords and larynx, unspecified: Secondary | ICD-10-CM | POA: Insufficient documentation

## 2020-06-30 DIAGNOSIS — Z930 Tracheostomy status: Secondary | ICD-10-CM

## 2020-06-30 DIAGNOSIS — Z7951 Long term (current) use of inhaled steroids: Secondary | ICD-10-CM | POA: Diagnosis not present

## 2020-06-30 DIAGNOSIS — Z79890 Hormone replacement therapy (postmenopausal): Secondary | ICD-10-CM | POA: Insufficient documentation

## 2020-06-30 DIAGNOSIS — Z7902 Long term (current) use of antithrombotics/antiplatelets: Secondary | ICD-10-CM | POA: Insufficient documentation

## 2020-06-30 DIAGNOSIS — Z91030 Bee allergy status: Secondary | ICD-10-CM | POA: Diagnosis not present

## 2020-06-30 DIAGNOSIS — Z882 Allergy status to sulfonamides status: Secondary | ICD-10-CM | POA: Insufficient documentation

## 2020-06-30 DIAGNOSIS — Z79899 Other long term (current) drug therapy: Secondary | ICD-10-CM | POA: Diagnosis not present

## 2020-06-30 DIAGNOSIS — Z886 Allergy status to analgesic agent status: Secondary | ICD-10-CM | POA: Diagnosis not present

## 2020-06-30 DIAGNOSIS — Z79891 Long term (current) use of opiate analgesic: Secondary | ICD-10-CM | POA: Diagnosis not present

## 2020-06-30 NOTE — Progress Notes (Signed)
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 Trach placed by ENT. Was really fine until recent change from [facility] where the size 4 trach she had underwent model change (the old ones are now obsolete and replaced w/ the new flexible version). Since the old trach has been retired she tried the newer model which she didn't tolerate for long. She presents today in hopes that we might be able to find a suitable trach alternative   Review of Systems  Constitutional: Negative.   HENT: Negative.        She did at one point report sig trach site/stoma site discomfort she described as stabbing pain w/ red rash around stoma site when she had the new style flex shiley. This resolved after she replaced it with her prior model version   Eyes: Negative.   Respiratory: Negative.   Cardiovascular: Negative.   Gastrointestinal: Negative.   Genitourinary: Negative.   Musculoskeletal: Negative.   Skin: Negative.   Neurological: Negative.   Endo/Heme/Allergies: Negative.   Psychiatric/Behavioral: Negative.    PMH    has a past medical history of Allergic rhinitis, Allergy, Anemia, Asthma, Cataract (3382), Complication of anesthesia, Compressed cervical disc, COPD (chronic obstructive pulmonary disease) ([place]), CVA (cerebral infarction), Dyspnea, Esophageal dysmotility, Heart murmur, History of hiatal hernia, IBS (irritable bowel syndrome), Migraine, PICC (peripherally inserted central catheter) removal (02/20/2017), PONV (postoperative nausea and vomiting), Problems with swallowing, Pulmonary fibrosis ([place]), Shingles, Stroke ([place]), Tracheostomy in place [facility]), and Vocal cord paresis.   Surgical Hx    has a past surgical history that includes Abdominal hysterectomy; Tubal ligation; Tracheostomy (1996); Video bronchoscopy (Bilateral, 11/20/2012); Jejunostomy feeding tube; Esophageal manometry (N/A, 12/16/2012); Eye  surgery; Eye Surgery AS Child (Left); Esophagogastroduodenoscopy (egd) with propofol (N/A, 07/29/2013); Botox injection (N/A, 07/29/2013); Esophagogastroduodenoscopy (egd) with propofol (N/A, 05/04/2015); Botox injection (N/A, 05/04/2015); Colonoscopy; Breast surgery (Left, 2002); Cholecystectomy; Multiple tooth extractions; Posterior cervical fusion/foraminotomy (N/A, 01/10/2017); Incision and drainage of wound (N/A, 02/21/2017); Application of a-cell of head/neck (N/A, 02/21/2017); Breast biopsy (Left); Esophagogastroduodenoscopy (egd) with propofol (N/A, 02/18/2018); Botox injection (N/A, 02/18/2018); Oophorectomy; Lumbar laminectomy/decompression microdiscectomy (Bilateral, 05/13/2018); Esophagogastroduodenoscopy (egd) with propofol (N/A, 06/30/2019); and Botox injection (N/A, 06/30/2019).   Social Hx   . Frequency of Communication with Friends and Family: More than three times a week  . Frequency of Social Gatherings with Friends and Family: More than three times a week  . Attends Religious Services: yes  . Active Member of Clubs or Organizations: Yes her church   . Attends Archivist Meetings: More than 4 times per year, sings still  . Marital Status: Married   Family Hx   family history includes Alcohol abuse in her mother; Arrhythmia in her father; Asthma in her cousin and father; Breast cancer (age of onset: 61) in her maternal aunt; Breast cancer (age of onset: 39) in her maternal grandmother; COPD in her cousin and paternal grandfather; Kidney cancer in her father; Lung cancer in her father and paternal uncle.   Allergies  Allergen Reactions  . Asa [Aspirin] Shortness Of Breath, Swelling and Other (See Comments)    Tongue swells  . Bee Venom Anaphylaxis and Shortness Of Breath  . Epinephrine Shortness Of Breath and Palpitations    Occurs when pt is given double the dose, pt would use epipen as needed   . Feraheme [Ferumoxytol] Other (See Comments)    Throat squeezing  . Influenza  Vaccines Shortness Of Breath and Other (See Comments)    Can take flu vaccines without eggs   . Pfizer-Biontech Covid-19 Vacc [Covid-19 Mrna Vacc (Moderna)] Anaphylaxis  . Quinolones Swelling and Other (See Comments)    Feet swell  . Shellfish Allergy Anaphylaxis  . Ciprofloxacin Swelling and Other (See Comments)    ALL MEDS ENDING IN -FLOXACIN MAKE THE FEET SWELL  . Clarithromycin Nausea Only  . Erythromycin Nausea Only  . Levaquin [Levofloxacin In D5w] Swelling and Other (See Comments)    Feet and legs ache and SWELL  . Peanut-Containing Drug Products Other (See Comments)    Wheezing (boiled or raw peanuts)  . Septra [Sulfamethoxazole-Trimethoprim] Diarrhea  . Telithromycin Nausea Only  . Zanaflex [Tizanidine] Other (See Comments)    Caused the patient to feel "spaced out" and "not well"  . Adhesive [Tape] Rash and Other (See Comments)    Paper works tape works fine    Current Outpatient Medications:  .  acetaminophen (TYLENOL) 500 MG tablet, Take 500 mg by mouth 2 (two) times daily as needed for moderate pain or headache. , Disp: , Rfl:  .  albuterol (VENTOLIN HFA) 108 (90 Base) MCG/ACT inhaler, Inhale 2 puffs into the lungs every 6 (six) hours as needed for wheezing or shortness of breath., Disp: 18 g, Rfl: 2 .  azelastine (ASTELIN) 0.1 % nasal spray, Place 1 spray into both nostrils 2 (two) times daily. Use in each nostril as directed, Disp: 30 mL, Rfl: 6 .  budesonide (PULMICORT) 0.25 MG/2ML nebulizer solution, Take 2 mLs (0.25 mg total) by nebulization 2 (two) times daily. Use after DuoNeb (Patient not taking: Reported on 06/14/2020), Disp: 120 mL, Rfl: 11 .  cetirizine (ZYRTEC) 10 MG tablet, TAKE 1 TABLET BY MOUTH EVERY DAY (NOT COVERED, Disp: 90 tablet, Rfl: 0 .  clopidogrel (PLAVIX) 75 MG tablet, TAKE 1 TABLET BY MOUTH EVERY DAY, Disp: 90 tablet, Rfl: 1 .  diphenhydrAMINE (BENADRYL) 25 MG tablet, Take 25 mg by mouth daily as needed for allergies. , Disp: , Rfl:  .  EPINEPHrine  0.3 mg/0.3 mL IJ SOAJ injection, Inject 0.3 mg into the muscle as needed for anaphylaxis. , Disp: , Rfl:  .  estradiol (ESTRACE) 1 MG tablet, TAKE 1 TABLET BY MOUTH EVERY DAY, Disp: 90 tablet, Rfl: 1 .  fluticasone (FLONASE) 50 MCG/ACT nasal spray, PLACE 2 SPRAYS INTO BOTH NOSTRILS DAILY. (Patient taking differently: Place 2 sprays into both nostrils daily as needed for allergies.), Disp: 16 g, Rfl: 2 .  furosemide (LASIX) 20 MG tablet, TAKE ONE TABLET DAILY AS NEEDED., Disp: 90 tablet, Rfl: 1 .  guaiFENesin-codeine 100-10 MG/5ML syrup, Take 5 mLs by mouth 2 (two) times daily as needed for cough., Disp: 120 mL, Rfl: 0 .  HYDROcodone-homatropine (HYCODAN) 5-1.5 MG/5ML syrup, Take 5 mLs by mouth every 8 (eight) hours as needed for cough., Disp: 120 mL, Rfl: 0 .  ibuprofen (ADVIL,MOTRIN) 200 MG tablet, Take 400-800 mg by mouth daily as needed for headache or moderate pain., Disp: , Rfl:  .  ipratropium-albuterol (DUONEB) 0.5-2.5 (3) MG/3ML SOLN, Take 3 mLs by nebulization every 4 (four) hours as needed. Dx 496 (Patient taking differently: Take 3 mLs by nebulization every 4 (four) hours as needed (wheezing or shortness of breath). Dx 496), Disp: 120 mL, Rfl: 2 .  metoCLOPramide (REGLAN) 5 MG tablet, Take 1 tablet (5 mg total) by mouth 3 (three) times daily with meals as needed for nausea., Disp: 60 tablet, Rfl: 0 .  metoprolol succinate (TOPROL XL) 25 MG 24 hr tablet, Take 0.5 tablets (12.5 mg total) by mouth daily., Disp: 45 tablet, Rfl: 3 .  montelukast (SINGULAIR) 10 MG tablet, TAKE 1 TABLET BY MOUTH EVERYDAY AT BEDTIME, Disp: 90 tablet, Rfl: 1 .  mupirocin ointment (BACTROBAN) 2 %, Apply 1 application topically daily as needed (FOR [facility] SITE IRRITATION)., Disp: , Rfl:  .  Naphazoline-Pheniramine 0.027-0.315 % SOLN, Place 1 drop into both eyes daily as needed (allergies). , Disp: , Rfl:  .  pantoprazole (PROTONIX) 40 MG tablet, TAKE ONE TABLET EVERY DAY NEEDS FOR OFFICE USE VISIT FOR MORE REFILLS.,  Disp: 90 tablet, Rfl: 2 .  potassium chloride SA (K-DUR) 20 MEQ tablet, Take 2 tablets (40 mEq total) by mouth daily., Disp: 6 tablet, Rfl: 0 .  predniSONE (DELTASONE) 20 MG tablet, Take 2 tablets (40 mg total) by mouth daily with breakfast. x5-10 days, Disp: 20 tablet, Rfl: 0 .  rizatriptan (MAXALT-MLT) 10 MG disintegrating tablet, TAKE 1 TABLET (10 MG TOTAL) BY MOUTH DAILY AS NEEDED FOR MIGRAINE. MAY REPEAT FOR ONE DOSE 2 HOURS AFTER THE FIRST ONE IF NEEDED., Disp: 10 tablet, Rfl: 0 .  sodium chloride (OCEAN) 0.65 % SOLN nasal spray, Place 1 spray into both nostrils every 4 (four) hours as needed for congestion. , Disp: , Rfl:  .  sucralfate (CARAFATE) 1 g tablet, Take 1 tablet (1 g total) by mouth 4 (four) times daily., Disp: 360 tablet, Rfl: 0 .  traMADol (ULTRAM) 50 MG tablet, , Disp: , Rfl:  .  White Petrolatum-Mineral Oil (LUBRICANT EYE) OINT, Place 1 application into both eyes 2 (two) times daily as needed (for dry eyes). , Disp: , Rfl:    Exam  General appearance:  70 Year old  female, well nourished  NAD, conversant  Eyes: anicteric sclerae icteric , moist conjunctivae; PERRL, EOMI bilaterally. Mouth: moist/dry membranes and no mucosal ulcerations;  Neck: Trachea midline; neck supple, no JVD. Trach site clean. Had size 4 cuffless flex shiley. This was capped and her phonation quality is excellent. There is faint upper airway wheeze w/ the occluding cap in place.  Lungs/chest: faint exp wheeze primarily coming from the upper airway. No accessory use  CV: RRR, no MRGs  Abdomen: Soft, non-tender; no masses  Extremities: No peripheral edema Skin: Normal temperature, turgor and texture; no rash, ulcers Psych: Appropriate affect, alert and oriented to person, place and time  Procedure  The trach was removed. Site inspected. This was clean and unremarkable. We placed a new size 6 portex (same OD as 4 shiley). Pt tolerated well. Verified w/ ETCO2.   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 But also just from QOL stand-point the newer shiley also protrudes from the neck much further and no longer has the locking mechanism for the occlusive cap. We examined several different options based on trach OD and length. We settled on two. For now we will try the Size 6 portex, she is not in love with the degree that it protrudes out of her neck but says it feels ok   Plan Will send her home w/ the size 6 portex.  Will call Josely next week to see how she is tolerating (husband's name is Ron) [phone] or [phone]  Will see her in a couple of weeks w/ plan to change to pediatric size 6 or 6.5   My time 45 minutes  Erick Colace ACNP-BC [facility]  Pulmonary/Critical Care Pager # [phone] OR # [phone] if no answer

## 2020-06-30 NOTE — Progress Notes (Signed)
Tracheostomy Procedure Note  ERMAGENE SAIDI [id number] 02-13-1951  Pre Procedure Tracheostomy Information  Trach Brand: Shiley Size: 4.0   Style: Uncuffed Secured by: Velcro   Procedure: Trach cleaning and trach change     Post Procedure Tracheostomy Information  Trach Brand: Shiley Size: 6.0   [place] Portex 60A160 Style: Uncuffed Secured by: Velcro   Post Procedure Evaluation:  ETCO2 positive color change from yellow to purple : Yes.   Vital signs:VSS Patients current condition: stable Complications: No apparent complications Trach site exam: clean and dry Wound care done: dry Patient did tolerate procedure well.   Education: none  Prescription needs: none    Additional needs: None Will see patient back in 2 -3 weeks  Tried new style trach today

## 2020-07-02 ENCOUNTER — Ambulatory Visit (INDEPENDENT_AMBULATORY_CARE_PROVIDER_SITE_OTHER): Payer: Medicare Other

## 2020-07-02 ENCOUNTER — Other Ambulatory Visit: Payer: Self-pay

## 2020-07-02 DIAGNOSIS — E538 Deficiency of other specified B group vitamins: Secondary | ICD-10-CM

## 2020-07-02 MED ORDER — CYANOCOBALAMIN 1000 MCG/ML IJ SOLN
1000.0000 ug | Freq: Once | INTRAMUSCULAR | Status: AC
  Administered 2020-07-02: 1000 ug via INTRAMUSCULAR

## 2020-07-02 NOTE — Progress Notes (Signed)
Patient presented for B 12 injection to right deltoid, patient voiced no concerns nor showed any signs of distress during injection. 

## 2020-07-05 NOTE — Progress Notes (Signed)
I called [name] as had planned after our visit on 4/6. She reported to me that after about 24 hours she noted increased trach site congestion and then around 48 hrs more pain and redness. Similar to what it looks like w/ certain band aids. She changed back to her hold style trach at home and reports to be perfectly back to baseline as soon as she did this.  Plan Will see her in about 2 weeks. Hoping the pediatric trach as it is similar material as her old trach will be better tolerated.   Erick Colace ACNP-BC [place] Pager # [phone] OR # [phone] if no answer

## 2020-07-07 ENCOUNTER — Other Ambulatory Visit: Payer: Self-pay

## 2020-07-07 ENCOUNTER — Ambulatory Visit (INDEPENDENT_AMBULATORY_CARE_PROVIDER_SITE_OTHER): Payer: Medicare Other

## 2020-07-07 DIAGNOSIS — R0602 Shortness of breath: Secondary | ICD-10-CM | POA: Diagnosis not present

## 2020-07-07 LAB — ECHOCARDIOGRAM COMPLETE
AR max vel: 2.65 cm2
AV Area VTI: 2.67 cm2
AV Area mean vel: 2.69 cm2
AV Mean grad: 2 mmHg
AV Peak grad: 3.8 mmHg
Ao pk vel: 0.98 m/s
Area-P 1/2: 4.24 cm2
Calc EF: 56 %
P 1/2 time: 527 msec
S' Lateral: 2.5 cm
Single Plane A2C EF: 59.3 %
Single Plane A4C EF: 51.1 %

## 2020-07-14 ENCOUNTER — Encounter: Payer: Self-pay | Admitting: Pulmonary Disease

## 2020-07-15 ENCOUNTER — Ambulatory Visit: Payer: Medicare Other | Admitting: Dermatology

## 2020-07-16 ENCOUNTER — Other Ambulatory Visit: Payer: Self-pay | Admitting: Family

## 2020-07-16 ENCOUNTER — Telehealth: Payer: Self-pay | Admitting: Internal Medicine

## 2020-07-16 NOTE — Telephone Encounter (Signed)
Patient called states she spoke with someone at [facility] and they advised her that the Covid- 19 appointment needs to be changed currently on for 07/26/20 also asking if the test can be done at a location in [place].

## 2020-07-16 NOTE — Telephone Encounter (Signed)
Pts covid screen moved to 5/9 at 9am at [facility]. Pt aware.

## 2020-07-19 ENCOUNTER — Telehealth: Payer: Self-pay | Admitting: Pulmonary Disease

## 2020-07-19 MED ORDER — PREDNISONE 20 MG PO TABS: 20.0000 mg | ORAL_TABLET | Freq: Every day | ORAL | 0 refills | Status: DC

## 2020-07-19 NOTE — Telephone Encounter (Signed)
Pred 20 mg daily for 7 days

## 2020-07-19 NOTE — Telephone Encounter (Signed)
Called spoke with patient let her know Dr. Zoila Shutter recommendations Verified pharmacy  Placed order Nothing further needed at this time.

## 2020-07-19 NOTE — Telephone Encounter (Signed)
Called spoke with patient she states she has been feeling short of breath, congested, swollen in her neck since last week Wednesday or Thursday.  She is taking all of her inhalers, nebulizers, zytrec, singulair astelin and nothing is helping. She does not have a pulse ox to check her oxygen but she states she is tired more than usual but not in a danger state. She is getting covid tested tomorrow since she has an appointment for trache care this wee and they require her to get tested before visits.   Patient would like recommendations or something called in for her to take. Sending to Dr. Mortimer Fries to address these recommendations.

## 2020-07-21 ENCOUNTER — Other Ambulatory Visit: Payer: Self-pay

## 2020-07-21 ENCOUNTER — Other Ambulatory Visit
Admission: RE | Admit: 2020-07-21 | Discharge: 2020-07-21 | Disposition: A | Discharge status: Home / Self Care | Payer: Medicare Other | Source: Ambulatory Visit | Attending: Acute Care | Admitting: Acute Care

## 2020-07-21 DIAGNOSIS — Z01812 Encounter for preprocedural laboratory examination: Secondary | ICD-10-CM | POA: Insufficient documentation

## 2020-07-21 DIAGNOSIS — Z20822 Contact with and (suspected) exposure to covid-19: Secondary | ICD-10-CM | POA: Insufficient documentation

## 2020-07-21 LAB — SARS CORONAVIRUS 2 (TAT 6-24 HRS): SARS Coronavirus 2: NEGATIVE

## 2020-07-22 ENCOUNTER — Other Ambulatory Visit
Admission: RE | Admit: 2020-07-22 | Discharge: 2020-07-22 | Disposition: A | Discharge status: Home / Self Care | Payer: Medicare Other | Attending: Cardiovascular Disease | Admitting: Cardiovascular Disease

## 2020-07-22 ENCOUNTER — Ambulatory Visit (INDEPENDENT_AMBULATORY_CARE_PROVIDER_SITE_OTHER): Payer: Medicare Other | Admitting: Cardiovascular Disease

## 2020-07-22 VITALS — BP 110/58 | HR 62 | Ht 63.0 in | Wt 104.0 lb

## 2020-07-22 DIAGNOSIS — I209 Angina pectoris, unspecified: Secondary | ICD-10-CM

## 2020-07-22 DIAGNOSIS — R002 Palpitations: Secondary | ICD-10-CM

## 2020-07-22 DIAGNOSIS — R0600 Dyspnea, unspecified: Secondary | ICD-10-CM | POA: Insufficient documentation

## 2020-07-22 DIAGNOSIS — Z79899 Other long term (current) drug therapy: Secondary | ICD-10-CM | POA: Insufficient documentation

## 2020-07-22 DIAGNOSIS — R0609 Other forms of dyspnea: Secondary | ICD-10-CM

## 2020-07-22 DIAGNOSIS — I872 Venous insufficiency (chronic) (peripheral): Secondary | ICD-10-CM | POA: Diagnosis not present

## 2020-07-22 DIAGNOSIS — I493 Ventricular premature depolarization: Secondary | ICD-10-CM | POA: Diagnosis not present

## 2020-07-22 LAB — BASIC METABOLIC PANEL
Anion gap: 7 (ref 5–15)
BUN: 16 mg/dL (ref 8–23)
CO2: 28 mmol/L (ref 22–32)
Calcium: 9 mg/dL (ref 8.9–10.3)
Chloride: 104 mmol/L (ref 98–111)
Creatinine, Ser: 0.95 mg/dL (ref 0.44–1.00)
GFR, Estimated: >60 mL/min (ref >60)
Glucose, Bld: 87 mg/dL (ref 70–99)
Potassium: 3.7 mmol/L (ref 3.5–5.1)
Sodium: 139 mmol/L (ref 135–145)

## 2020-07-22 MED ORDER — METOPROLOL TARTRATE 25 MG PO TABS: ORAL_TABLET | ORAL | 0 refills | Status: DC

## 2020-07-22 NOTE — Patient Instructions (Signed)
Medication Instructions:  Your physician recommends that you continue on your current medications as directed. Please refer to the Current Medication list given to you today.  A one time dose of Metoprolol 25 mg to be taken 2 hours prior to your Cardiac CT has been sent to your pharmacy.  *If you need a refill on your cardiac medications before your next appointment, please call your pharmacy*   Lab Work: Bmp today If you have labs (blood work) drawn today and your tests are completely normal, you will receive your results only by: [name] MyChart Message (if you have MyChart) OR . A paper copy in the mail If you have any lab test that is abnormal or we need to change your treatment, we will call you to review the results.   Testing/Procedures: Your physician has requested that you have cardiac CT. Cardiac computed tomography (CT) is a painless test that uses an x-ray machine to take clear, detailed pictures of your heart. For further information please visit [email or web address]. Please follow instruction sheet as given.      Follow-Up: At [facility], you and your health needs are our priority.  As part of our continuing mission to provide you with exceptional heart care, we have created designated Provider Care Teams.  These Care Teams include your primary Cardiologist (physician) and Advanced Practice Providers (APPs -  Physician Assistants and Nurse Practitioners) who all work together to provide you with the care you need, when you need it.  We recommend signing up for the patient portal called "MyChart".  Sign up information is provided on this After Visit Summary.  MyChart is used to connect with patients for Virtual Visits (Telemedicine).  Patients are able to view lab/test results, encounter notes, upcoming appointments, etc.  Non-urgent messages can be sent to your provider as well.   To learn more about what you can do with MyChart, go to [email or web address].    Your next  appointment:   Your physician wants you to follow-up in: 1 year You will receive a reminder letter in the mail two months in advance. If you don't receive a letter, please call our office to schedule the follow-up appointment.   The format for your next appointment:   In Person  Provider:   You may see Kathlyn Sacramento, MD or one of the following Advanced Practice Providers on your designated Care Team:    Murray Hodgkins, NP  Christell Faith, PA-C  Marrianne Mood, PA-C  Cadence Kathlen Mody, [place]  Laurann Montana, NP    Other Instructions Your cardiac CT will be scheduled at one of the below locations:   [facility] [address] [place], [place] [address] [phone]  [place] [address] [place], [place] [address] [phone]  If scheduled at [facility], please arrive at the [facility] main entrance (entrance A) of [facility] 30 minutes prior to test start time. Proceed to the [facility] Radiology Department (first floor) to check-in and test prep.  If scheduled at [facility], please arrive 15 mins early for check-in and test prep.  Please follow these instructions carefully (unless otherwise directed):  Hold all erectile dysfunction medications at least 3 days (72 hrs) prior to test.  On the Night Before the Test: . Be sure to Drink plenty of water. . Do not consume any caffeinated/decaffeinated beverages or chocolate 12 hours prior to your test. . Do not take any antihistamines  12 hours prior to your test.  On the Day of the Test: . Drink plenty of water until 1 hour prior to the test. . Do not eat any food 4 hours prior to the test. . You may take your regular medications prior to the test.  . Take metoprolol (Lopressor) two hours prior to test. . HOLD Furosemide morning of the test. . FEMALES- please wear underwire-free bra if available        After the Test: . Drink plenty of water. . After receiving IV contrast, you may experience a mild flushed feeling. This is normal. . On occasion, you may experience a mild rash up to 24 hours after the test. This is not dangerous. If this occurs, you can take Benadryl 25 mg and increase your fluid intake. . If you experience trouble breathing, this can be serious. If it is severe call 911 IMMEDIATELY. If it is mild, please call our office. . If you take any of these medications: Glipizide/Metformin, Avandament, Glucavance, please do not take 48 hours after completing test unless otherwise instructed.   Once we have confirmed authorization from your insurance company, we will call you to set up a date and time for your test. Based on how quickly your insurance processes prior authorizations requests, please allow up to 4 weeks to be contacted for scheduling your Cardiac CT appointment. Be advised that routine Cardiac CT appointments could be scheduled as many as 8 weeks after your provider has ordered it.  For non-scheduling related questions, please contact the cardiac imaging nurse navigator should you have any questions/concerns: Marchia Bond, Cardiac Imaging Nurse Navigator Gordy Clement, Cardiac Imaging Nurse Navigator [facility] Direct Office Dial: [phone]   For scheduling needs, including cancellations and rescheduling, please call [place], [phone].

## 2020-07-22 NOTE — Progress Notes (Signed)
Cardiology Office Note   Date:  07/22/2020   ID:  Anne [name], DOB 16-Jul-1950, MRN [id number]  PCP:  [name], MD  Cardiologist:   Kathlyn Sacramento, MD   Chief Complaint  Patient presents with  . Follow-up    12 month f/u pt would like to discuss echo. Meds reviewed verbally with pt.      History of Present Illness: Anne [name] is a 70 y.o. female who is here today for follow-up visit regarding palpitations.  She is not a smoker and there is no family history of coronary artery disease. She did have possible TIAs in the past and has been on Plavix for that reason. She also has chronic nutritional problems requiring tube feeding in the past.  She has known history of atypical chest pain with negative nuclear stress testing in the past.  Echocardiogram showed normal LV systolic function with no significant valvular abnormalities. She had outpatient monitor done in 2019 which showed 1 short run of SVT.  She was started on small dose metoprolol.  She was most recently seen in our office 1 year ago.  She was stable at that time.  She had COVID-19 infection in January but her symptoms were mild.  She was seen by Dr. Patsey Berthold in March for shortness of breath.  She had an echocardiogram done which showed normal LV systolic function with grade 2 diastolic dysfunction.  However, I reviewed the echocardiogram images.  Left atrial size was normal and pulmonary pressure could not be evaluated.  E/A ratio was normal but E to E prime was greater than 15.  Based on this, I think her diastolic function was indeterminate.  Her IVC was normal in size and reactive. She reports dyspnea with minimal exertion over the last few months.  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  No chest pain.  She has stable palpitations on metoprolol.  Past Medical History:  Diagnosis Date  . Allergic rhinitis   . Allergy    multiple, mostly aspirin, levaquin and shellfish.  . Anemia   .  Asthma   . Cataract 2014   exraction with len implant  . Complication of anesthesia    Breathing problems upon waking up. 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  . Compressed cervical disc   . COPD (chronic obstructive pulmonary disease) ([place])   . CVA (cerebral infarction)   . Dyspnea   . Esophageal dysmotility   . Heart murmur    as child  . History of hiatal hernia   . IBS (irritable bowel syndrome)   . Migraine   . PICC (peripherally inserted central catheter) removal 02/20/2017  . PONV (postoperative nausea and vomiting)   . Problems with swallowing    intermittently  . Pulmonary fibrosis ([place])   . Shingles   . Stroke [facility])    slurred speech, drawn face, imaging normal, occurred twice, [facility]-"TIA" if antything" 02/20/17-no residual effects  . Tracheostomy in place [facility])   . Vocal cord paresis     Past Surgical History:  Procedure Laterality Date  . ABDOMINAL HYSTERECTOMY    . APPLICATION OF A-CELL OF HEAD/NECK N/A 02/21/2017   Procedure: APPLICATION OF A-CELL OF HEAD/NECK;  Surgeon: Wallace Going, DO;  Location: [place];  Service: Plastics;  Laterality: N/A;  . BOTOX INJECTION N/A 07/29/2013   Procedure: BOTOX INJECTION;  Surgeon: Jerene Bears, MD;  Location: [facility];  Service: Gastroenterology;  Laterality: N/A;  .  BOTOX INJECTION N/A 05/04/2015   Procedure: BOTOX INJECTION;  Surgeon: Jerene Bears, MD;  [facility]: [facility];  Service: Gastroenterology;  Laterality: N/A;  . BOTOX INJECTION N/A 02/18/2018   Procedure: BOTOX INJECTION;  Surgeon: Jerene Bears, MD;  [facility];  Service: Gastroenterology;  Laterality: N/A;  . BOTOX INJECTION N/A 06/30/2019   Procedure: BOTOX INJECTION;  Surgeon: Jerene Bears, MD;  [facility];  Service: Gastroenterology;  Laterality: N/A;  . BREAST BIOPSY Left    neg  . BREAST SURGERY Left 2002   bx of skin  . CHOLECYSTECTOMY    . COLONOSCOPY    . ESOPHAGEAL MANOMETRY N/A 12/16/2012    Procedure: ESOPHAGEAL MANOMETRY (EM);  Surgeon: Jerene Bears, MD;  [facility];  Service: Gastroenterology;  Laterality: N/A;  . ESOPHAGOGASTRODUODENOSCOPY (EGD) WITH PROPOFOL N/A 07/29/2013   Procedure: ESOPHAGOGASTRODUODENOSCOPY (EGD) WITH PROPOFOL;  Surgeon: Jerene Bears, MD;  [facility];  Service: Gastroenterology;  Laterality: N/A;  with botox injection  . ESOPHAGOGASTRODUODENOSCOPY (EGD) WITH PROPOFOL N/A 05/04/2015   Procedure: ESOPHAGOGASTRODUODENOSCOPY (EGD) WITH PROPOFOL;  Surgeon: Jerene Bears, MD;  [facility];  Service: Gastroenterology;  Laterality: N/A;  . ESOPHAGOGASTRODUODENOSCOPY (EGD) WITH PROPOFOL N/A 02/18/2018   Procedure: ESOPHAGOGASTRODUODENOSCOPY (EGD) WITH PROPOFOL;  Surgeon: Jerene Bears, MD;  [facility];  Service: Gastroenterology;  Laterality: N/A;  . ESOPHAGOGASTRODUODENOSCOPY (EGD) WITH PROPOFOL N/A 06/30/2019   Procedure: ESOPHAGOGASTRODUODENOSCOPY (EGD) WITH PROPOFOL;  Surgeon: Jerene Bears, MD;  [facility];  Service: Gastroenterology;  Laterality: N/A;  . EYE SURGERY     Catarct surgery 2014  . Eye Surgery AS Child Left   . INCISION AND DRAINAGE OF WOUND N/A 02/21/2017   Procedure: IRRIGATION AND DEBRIDEMENT WOUND NECK;  Surgeon: Wallace Going, DO;  Location: [place];  Service: Plastics;  Laterality: N/A;  . JEJUNOSTOMY FEEDING TUBE     x2 both failed. no longer has  . LUMBAR LAMINECTOMY/DECOMPRESSION MICRODISCECTOMY Bilateral 05/13/2018   Procedure: Laminectomy and Foraminotomy - Lumbar four-Lumbar five- bilateral;  Surgeon: Eustace Moore, MD;  Location: [place];  Service: Neurosurgery;  Laterality: Bilateral;  . MULTIPLE TOOTH EXTRACTIONS     2 teeth removed  . OOPHORECTOMY    . POSTERIOR CERVICAL FUSION/FORAMINOTOMY N/A 01/10/2017   Procedure: LAMINECTOMY AND FORAMINOTOMY CERVICAL FOUR-CERVICAL FIVE, CERVICAL FIVE-SIX POSTERIOR CERVICAL INSTRUMENT FUSION CERVICAL THREE-CERVICAL SEVEN,CERVICAL  LAMINECTOMY CERVICAL THREE-CERVICAL SEVEN.;  Surgeon: Eustace Moore, MD;  Location: [place];  Service: Neurosurgery;  Laterality: N/A;  posterior  . TRACHEOSTOMY  1996   done at [facility], Dr. Kathyrn Sheriff  . TUBAL LIGATION    . VIDEO BRONCHOSCOPY Bilateral 11/20/2012   Procedure: VIDEO BRONCHOSCOPY WITH FLUORO;  Surgeon: Juanito Doom, MD;  [facility];  Service: Cardiopulmonary;  Laterality: Bilateral;     Current Outpatient Medications  Medication Sig Dispense Refill  . acetaminophen (TYLENOL) 500 MG tablet Take 500 mg by mouth 2 (two) times daily as needed for moderate pain or headache.     . albuterol (VENTOLIN HFA) 108 (90 Base) MCG/ACT inhaler Inhale 2 puffs into the lungs every 6 (six) hours as needed for wheezing or shortness of breath. 18 g 2  . azelastine (ASTELIN) 0.1 % nasal spray Place 1 spray into both nostrils 2 (two) times daily. Use in each nostril as directed 30 mL 6  . budesonide (PULMICORT) 0.25 MG/2ML nebulizer solution Take 2 mLs (0.25 mg total) by nebulization 2 (two) times daily. Use after DuoNeb 120 mL  11  . cetirizine (ZYRTEC) 10 MG tablet TAKE 1 TABLET BY MOUTH EVERY DAY (NOT COVERED 90 tablet 0  . clopidogrel (PLAVIX) 75 MG tablet TAKE 1 TABLET BY MOUTH EVERY DAY 90 tablet 1  . diphenhydrAMINE (BENADRYL) 25 MG tablet Take 25 mg by mouth daily as needed for allergies.     [name] EPINEPHrine 0.3 mg/0.3 mL IJ SOAJ injection Inject 0.3 mg into the muscle as needed for anaphylaxis.     [name] estradiol (ESTRACE) 1 MG tablet TAKE 1 TABLET BY MOUTH EVERY DAY 90 tablet 1  . fluticasone (FLONASE) 50 MCG/ACT nasal spray Place into both nostrils daily as needed for allergies or rhinitis.    . furosemide (LASIX) 20 MG tablet TAKE ONE TABLET DAILY AS NEEDED. 90 tablet 1  . guaiFENesin-codeine 100-10 MG/5ML syrup Take 5 mLs by mouth 2 (two) times daily as needed for cough. 120 mL 0  . HYDROcodone-homatropine (HYCODAN) 5-1.5 MG/5ML syrup Take 5 mLs by mouth every 8 (eight) hours as  needed for cough. 120 mL 0  . ibuprofen (ADVIL,MOTRIN) 200 MG tablet Take 400-800 mg by mouth daily as needed for headache or moderate pain.    [name] ipratropium-albuterol (DUONEB) 0.5-2.5 (3) MG/3ML SOLN Take 3 mLs by nebulization every 4 (four) hours as needed. Dx 496 120 mL 2  . metoCLOPramide (REGLAN) 5 MG tablet Take 1 tablet (5 mg total) by mouth 3 (three) times daily with meals as needed for nausea. 60 tablet 0  . metoprolol succinate (TOPROL-XL) 25 MG 24 hr tablet TAKE 1/2 TABLET BY MOUTH EVERY DAY 45 tablet 0  . montelukast (SINGULAIR) 10 MG tablet TAKE 1 TABLET BY MOUTH EVERYDAY AT BEDTIME 90 tablet 1  . mupirocin ointment (BACTROBAN) 2 % Apply 1 application topically daily as needed (FOR [facility] SITE IRRITATION).    . Naphazoline-Pheniramine 0.027-0.315 % SOLN Place 1 drop into both eyes daily as needed (allergies).     . pantoprazole (PROTONIX) 40 MG tablet TAKE ONE TABLET EVERY DAY NEEDS FOR OFFICE USE VISIT FOR MORE REFILLS. 90 tablet 2  . potassium chloride (KLOR-CON) 10 MEQ tablet Take 10 mEq by mouth as needed.    . predniSONE (DELTASONE) 20 MG tablet Take 1 tablet (20 mg total) by mouth daily with breakfast. 7 tablet 0  . rizatriptan (MAXALT-MLT) 10 MG disintegrating tablet TAKE 1 TABLET (10 MG TOTAL) BY MOUTH DAILY AS NEEDED FOR MIGRAINE. MAY REPEAT FOR ONE DOSE 2 HOURS AFTER THE FIRST ONE IF NEEDED. 10 tablet 0  . sodium chloride (OCEAN) 0.65 % SOLN nasal spray Place 1 spray into both nostrils every 4 (four) hours as needed for congestion.     . sucralfate (CARAFATE) 1 g tablet Take 1 tablet (1 g total) by mouth 4 (four) times daily. 360 tablet 0  . traMADol (ULTRAM) 50 MG tablet     . White Petrolatum-Mineral Oil (LUBRICANT EYE) OINT Place 1 application into both eyes 2 (two) times daily as needed (for dry eyes).      No current facility-administered medications for this visit.    Allergies:   Asa [aspirin], Bee venom, Epinephrine, Feraheme [ferumoxytol], Influenza vaccines,  Pfizer-biontech covid-19 vacc [covid-19 mrna vacc (moderna)], Quinolones, Shellfish allergy, Ciprofloxacin, Clarithromycin, Erythromycin, Levaquin [levofloxacin in d5w], Peanut-containing drug products, Septra [sulfamethoxazole-trimethoprim], Telithromycin, Zanaflex [tizanidine], and Adhesive [tape]    Social History:  The patient  reports that she has never smoked. She has never used smokeless tobacco. She reports current alcohol use. She reports that she does not use drugs.  Family History:  The patient's family history includes Alcohol abuse in her mother; Arrhythmia in her father; Asthma in her cousin and father; Breast cancer (age of onset: 36) in her maternal aunt; Breast cancer (age of onset: 16) in her maternal grandmother; COPD in her cousin and paternal grandfather; Kidney cancer in her father; Lung cancer in her father and paternal uncle.    ROS:  Please see the history of present illness.   Otherwise, review of systems are positive for none.   All other systems are reviewed and negative.    PHYSICAL EXAM: VS:  BP (!) 110/58 (BP Location: Left Arm, Patient Position: Sitting, Cuff Size: Normal)   Pulse 62   Ht 5\' 3"  (1.6 m)   Wt 104 lb (47.2 kg)   SpO2 98%   BMI 18.42 kg/m  , BMI Body mass index is 18.42 kg/m. GEN: Well nourished, well developed, in no acute distress  HEENT: normal  Neck: no JVD, carotid bruits, or masses Cardiac: RRR; no murmurs, rubs, or gallops,no edema  Respiratory:  clear to auscultation bilaterally, normal work of breathing GI: soft, nontender, nondistended, + BS MS: no deformity or atrophy  Skin: warm and dry, no rash Neuro:  Strength and sensation are intact Psych: euthymic mood, full affect   EKG:  EKG is ordered today. The ekg ordered today demonstrates sinus rhythm with first-degree AV block and nonspecific T wave changes.   Recent Labs: 01/13/2020: Hemoglobin 13.4; Platelets 199    Lipid Panel    Component Value Date/Time   CHOL 177  10/31/2018 1035   TRIG 74.0 10/31/2018 1035   HDL 77.90 10/31/2018 1035   CHOLHDL 2 10/31/2018 1035   VLDL 14.8 10/31/2018 1035   LDLCALC 84 10/31/2018 1035   LDLDIRECT 92.7 12/13/2012 1037      Wt Readings from Last 3 Encounters:  07/22/20 104 lb (47.2 kg)  06/14/20 105 lb 12.8 oz (48 kg)  05/14/20 103 lb (46.7 kg)        PAD Screen 10/09/2017  Previous PAD dx? No  Previous surgical procedure? No  Pain with walking? Yes  Subsides with rest? No  Feet/toe relief with dangling? No  Painful, non-healing ulcers? No  Extremities discolored? Yes      ASSESSMENT AND PLAN:  1.  Exertional dyspnea concerning for possible angina equivalent: We have to exclude obstructive coronary artery disease.  I discussed different options with her and recommend proceeding with CTA of coronary arteries with FFR if needed. I do not think she has significant diastolic dysfunction to explain her dyspnea.  2.  Chronic venous insufficiency: This is a likely cause for her leg edema.  I advised her to elevate her legs during the day as possible and also she can consider using knee-high support stockings.  3.  Palpitations due to PVCs: Stable on small dose Toprol.    Disposition:   FU in 1 year or earlier if CTA is abnormal.  Signed,  Kathlyn Sacramento, MD  07/22/2020 2:25 PM    [place]

## 2020-07-23 ENCOUNTER — Ambulatory Visit (HOSPITAL_COMMUNITY)
Admission: RE | Admit: 2020-07-23 | Discharge: 2020-07-23 | Disposition: A | Discharge status: Home / Self Care | Payer: Medicare Other | Source: Ambulatory Visit | Attending: Acute Care | Admitting: Acute Care

## 2020-07-23 ENCOUNTER — Encounter: Payer: Self-pay | Admitting: Pulmonary Disease

## 2020-07-23 DIAGNOSIS — Z930 Tracheostomy status: Secondary | ICD-10-CM | POA: Diagnosis not present

## 2020-07-23 DIAGNOSIS — Z430 Encounter for attention to tracheostomy: Secondary | ICD-10-CM | POA: Insufficient documentation

## 2020-07-23 NOTE — Progress Notes (Signed)
Tracheostomy Procedure Note  Anne [name] [id number] 05-Jul-1950  Pre Procedure Tracheostomy Information  Trach Brand: Shiley Size: 4.0 Legacy uncuffed Style: Uncuffed Secured by: Velcro   Procedure: Trach Cleaning and Trach change    Post Procedure Tracheostomy Information  Trach Brand: Shiley Size: 4.0 CFN Style: Uncuffed Secured by: Velcro   Post Procedure Evaluation:  ETCO2 positive color change from yellow to purple : Yes.   Vital signs:VSS Patients current condition: stable Complications: No apparent complications Trach site exam: clean and dry Wound care done: dry Patient did tolerate procedure well.   Education: New trach style 4CFN  Prescription needs: Prescription for new trach style ordered by NP    Additional needs: One additional trach 4CFN given to patient also today

## 2020-07-23 NOTE — Progress Notes (Signed)
Reason for visit  Planned trach change  HPI  Anne [name] returns to trach clinic today for planned trach exchange. As mentioned on my last progress note we tried switching her to portex biovona trach but because of the soft latex material she was unable to tolerate it and seemed to again develop a dermatological reaction within  24-48hrs. She switched back to her initial shiley and returned to baseline w/in 24 hrs -I had since researched possible other options and found that the shiley fenestrated model was unchanged and is still same material as the original shiley so I had ordered her these for replacement. She presented today to exchange trachs  Review of Systems  Constitutional: Negative.   HENT: Negative.   Eyes: Negative.   Respiratory: Negative.   Cardiovascular: Negative.   Gastrointestinal: Negative.   Genitourinary: Negative.   Musculoskeletal: Negative.   Skin: Negative.   Neurological: Negative.   Endo/Heme/Allergies: Negative.   Psychiatric/Behavioral: Negative.     Exam Is a pleasant 70 year old white female she is ambulatory walked into tracheostomy clinic today she is in no distress HEENT size #4 cuffless Shiley tracheostomy in place with occlusive In place phonation is excellent no upper airway noises appreciated Pulmonary: Clear to auscultation and without accessory use Cardiac: Regular rate and rhythm Abdomen: Soft nontender no organomegaly Extremities: Warm and dry brisk capillary refill Neuro: Intact   Procedure The existing tracheostomy was removed, the tracheostomy stoma was inspected and was found to be unremarkable.  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

## 2020-07-26 ENCOUNTER — Other Ambulatory Visit (HOSPITAL_COMMUNITY): Payer: Medicare Other

## 2020-07-28 NOTE — Addendum Note (Signed)
Addended by: Anselm Pancoast on: 07/28/2020 03:31 PM   Modules accepted: Orders

## 2020-08-02 ENCOUNTER — Other Ambulatory Visit: Payer: Self-pay

## 2020-08-02 ENCOUNTER — Other Ambulatory Visit
Admission: RE | Admit: 2020-08-02 | Discharge: 2020-08-02 | Disposition: A | Discharge status: Home / Self Care | Payer: Medicare Other | Source: Ambulatory Visit | Attending: Internal Medicine | Admitting: Internal Medicine

## 2020-08-02 DIAGNOSIS — Z01812 Encounter for preprocedural laboratory examination: Secondary | ICD-10-CM | POA: Diagnosis present

## 2020-08-02 DIAGNOSIS — Z20822 Contact with and (suspected) exposure to covid-19: Secondary | ICD-10-CM | POA: Diagnosis not present

## 2020-08-02 LAB — SARS CORONAVIRUS 2 (TAT 6-24 HRS): SARS Coronavirus 2: NEGATIVE

## 2020-08-03 ENCOUNTER — Other Ambulatory Visit: Payer: Self-pay

## 2020-08-03 ENCOUNTER — Ambulatory Visit (INDEPENDENT_AMBULATORY_CARE_PROVIDER_SITE_OTHER): Payer: Medicare Other

## 2020-08-03 DIAGNOSIS — E538 Deficiency of other specified B group vitamins: Secondary | ICD-10-CM

## 2020-08-03 MED ORDER — CYANOCOBALAMIN 1000 MCG/ML IJ SOLN
1000.0000 ug | Freq: Once | INTRAMUSCULAR | Status: AC
  Administered 2020-08-03: 1000 ug via INTRAMUSCULAR

## 2020-08-03 NOTE — Progress Notes (Signed)
Patient presented for B 12 injection to right deltoid, patient voiced no concerns nor showed any signs of distress during injection. 

## 2020-08-04 ENCOUNTER — Telehealth: Payer: Self-pay

## 2020-08-04 NOTE — Telephone Encounter (Signed)
Pt called and states she did not have instruction on holding her plavix for procedure tomorrow. She did not take the plavix yesterday, today and will not take it tomorrow. Per Dr. Hilarie Fredrickson pt is ok for procedure tomorrow. Pt aware.

## 2020-08-05 ENCOUNTER — Ambulatory Visit (HOSPITAL_COMMUNITY): Payer: Medicare Other | Admitting: Anesthesiology

## 2020-08-05 ENCOUNTER — Ambulatory Visit (HOSPITAL_COMMUNITY)
Admission: RE | Admit: 2020-08-05 | Discharge: 2020-08-05 | Disposition: A | Discharge status: Home / Self Care | Payer: Medicare Other | Attending: Internal Medicine | Admitting: Internal Medicine

## 2020-08-05 ENCOUNTER — Encounter (HOSPITAL_COMMUNITY)
Admission: RE | Disposition: A | Discharge status: Home / Self Care | Payer: Self-pay | Source: Home / Self Care | Attending: Internal Medicine

## 2020-08-05 ENCOUNTER — Other Ambulatory Visit: Payer: Self-pay

## 2020-08-05 ENCOUNTER — Encounter (HOSPITAL_COMMUNITY): Payer: Self-pay | Admitting: Internal Medicine

## 2020-08-05 DIAGNOSIS — Z886 Allergy status to analgesic agent status: Secondary | ICD-10-CM | POA: Diagnosis not present

## 2020-08-05 DIAGNOSIS — Z91013 Allergy to seafood: Secondary | ICD-10-CM | POA: Insufficient documentation

## 2020-08-05 DIAGNOSIS — Z887 Allergy status to serum and vaccine status: Secondary | ICD-10-CM | POA: Diagnosis not present

## 2020-08-05 DIAGNOSIS — Z888 Allergy status to other drugs, medicaments and biological substances status: Secondary | ICD-10-CM | POA: Insufficient documentation

## 2020-08-05 DIAGNOSIS — K220 Achalasia of cardia: Secondary | ICD-10-CM

## 2020-08-05 DIAGNOSIS — Z881 Allergy status to other antibiotic agents status: Secondary | ICD-10-CM | POA: Diagnosis not present

## 2020-08-05 DIAGNOSIS — Z91030 Bee allergy status: Secondary | ICD-10-CM | POA: Insufficient documentation

## 2020-08-05 DIAGNOSIS — Z8673 Personal history of transient ischemic attack (TIA), and cerebral infarction without residual deficits: Secondary | ICD-10-CM | POA: Diagnosis not present

## 2020-08-05 DIAGNOSIS — K2289 Other specified disease of esophagus: Secondary | ICD-10-CM | POA: Insufficient documentation

## 2020-08-05 DIAGNOSIS — R1310 Dysphagia, unspecified: Secondary | ICD-10-CM | POA: Diagnosis present

## 2020-08-05 DIAGNOSIS — Z7902 Long term (current) use of antithrombotics/antiplatelets: Secondary | ICD-10-CM | POA: Diagnosis not present

## 2020-08-05 DIAGNOSIS — Z91048 Other nonmedicinal substance allergy status: Secondary | ICD-10-CM | POA: Insufficient documentation

## 2020-08-05 DIAGNOSIS — K224 Dyskinesia of esophagus: Secondary | ICD-10-CM

## 2020-08-05 DIAGNOSIS — R1319 Other dysphagia: Secondary | ICD-10-CM | POA: Diagnosis not present

## 2020-08-05 DIAGNOSIS — Z91010 Allergy to peanuts: Secondary | ICD-10-CM | POA: Diagnosis not present

## 2020-08-05 HISTORY — PX: BOTOX INJECTION: SHX5754

## 2020-08-05 HISTORY — PX: ESOPHAGOGASTRODUODENOSCOPY (EGD) WITH PROPOFOL: SHX5813

## 2020-08-05 SURGERY — ESOPHAGOGASTRODUODENOSCOPY (EGD) WITH PROPOFOL
Anesthesia: Monitor Anesthesia Care

## 2020-08-05 MED ORDER — ONABOTULINUMTOXINA 100 UNITS IJ SOLR
INTRAMUSCULAR | Status: AC
  Filled 2020-08-05: qty 100

## 2020-08-05 MED ORDER — LACTATED RINGERS IV SOLN: INTRAVENOUS | Status: DC | PRN

## 2020-08-05 MED ORDER — SODIUM CHLORIDE (PF) 0.9 % IJ SOLN
INTRAMUSCULAR | Status: DC | PRN
  Administered 2020-08-05: 4 mL via SUBMUCOSAL

## 2020-08-05 MED ORDER — SODIUM CHLORIDE (PF) 0.9 % IJ SOLN
INTRAMUSCULAR | Status: AC
  Filled 2020-08-05: qty 10

## 2020-08-05 MED ORDER — LIDOCAINE HCL (CARDIAC) PF 100 MG/5ML IV SOSY
PREFILLED_SYRINGE | INTRAVENOUS | Status: DC | PRN
  Administered 2020-08-05: 40 mg via INTRAVENOUS

## 2020-08-05 MED ORDER — PROPOFOL 500 MG/50ML IV EMUL
INTRAVENOUS | Status: DC | PRN
  Administered 2020-08-05: 150 ug/kg/min via INTRAVENOUS

## 2020-08-05 MED ORDER — PROPOFOL 10 MG/ML IV BOLUS
INTRAVENOUS | Status: DC | PRN
  Administered 2020-08-05: 20 mg via INTRAVENOUS

## 2020-08-05 MED ORDER — LACTATED RINGERS IV SOLN: INTRAVENOUS | Status: DC

## 2020-08-05 MED ORDER — SODIUM CHLORIDE 0.9 % IV SOLN
INTRAVENOUS | Status: DC
Start: 2020-08-05 — End: 2020-08-05

## 2020-08-05 SURGICAL SUPPLY — 14 items

## 2020-08-05 NOTE — Op Note (Signed)
[facility] Patient Name: Anne [name] Procedure Date: 08/05/2020 MRN: [id number] Attending MD: Jerene Bears , MD Date of Birth: 04/13/1950 CSN: [id number] Age: 70 Admit Type: Outpatient Procedure:                Upper GI endoscopy Indications:              Dysphagia, for BOTOX injection for hypertensive LES                            (last Nov 2019 with significant improvement in                            swallowing dysfunction), controlled GERD Providers:                Lajuan Lines. Hilarie Fredrickson, MD, Benay Pillow, RN, Cherylynn Ridges, Technician, Nicholes Calamity Referring MD:              Medicines:                Propofol per Anesthesia Complications:            No immediate complications. Estimated Blood Loss:     Estimated blood loss was minimal. Procedure:                Pre-Anesthesia Assessment:                           - Prior to the procedure, a History and Physical                            was performed, and patient medications and                            allergies were reviewed. The patient's tolerance of                            previous anesthesia was also reviewed. The risks                            and benefits of the procedure and the sedation                            options and risks were discussed with the patient.                            All questions were answered, and informed consent                            was obtained. Prior Anticoagulants: The patient has                            taken Plavix (clopidogrel), last dose was 3 days  prior to procedure. ASA Grade Assessment: III - A                            patient with severe systemic disease. After                            reviewing the risks and benefits, the patient was                            deemed in satisfactory condition to undergo the                            procedure.                           After obtaining  informed consent, the endoscope was                            passed under direct vision. Throughout the                            procedure, the patient's blood pressure, pulse, and                            oxygen saturations were monitored continuously. The                            GIF-H190 ([id number]) Olympus gastroscope was                            introduced through the mouth, and advanced to the                            second part of duodenum. The upper GI endoscopy was                            accomplished without difficulty. The patient                            tolerated the procedure well. Scope In: Scope Out: Findings:      The examined esophagus was normal. The lower esophageal sphincter was       successfully injected in four quadrants each with 25 units botulinum       toxin (total 100 units).      The entire examined stomach was normal.      The examined duodenum was normal. Impression:               - Normal esophagus. LES injected with botulinum                            toxin.                           - Normal stomach.                           -  Normal examined duodenum.                           - No specimens collected. Moderate Sedation:      N/A Recommendation:           - Patient has a contact number available for                            emergencies. The signs and symptoms of potential                            delayed complications were discussed with the                            patient. Return to normal activities tomorrow.                            Written discharge instructions were provided to the                            patient.                           - Resume previous diet.                           - Continue present medications.                           - Resume Plavix (clopidogrel) at prior dose                            tomorrow. Refer to managing physician for further                            adjustment of  therapy.                           - Repeat upper endoscopy PRN for retreatment. Procedure Code(s):        --- Professional ---                           [phone], Esophagogastroduodenoscopy, flexible,                            transoral; with directed submucosal injection(s),                            any substance Diagnosis Code(s):        --- Professional ---                           R13.10, Dysphagia, unspecified CPT copyright 2019 American Medical Association. All rights reserved. The codes documented in this report are preliminary and upon coder review may  be revised to meet current compliance requirements. Jerene Bears, MD 08/05/2020 10:41:53 AM This report has been signed electronically. Number  of Addenda: 0

## 2020-08-05 NOTE — Discharge Instructions (Signed)
YOU HAD AN ENDOSCOPIC PROCEDURE TODAY: Refer to the procedure report and other information in the discharge instructions given to you for any specific questions about what was found during the examination. If this information does not answer your questions, please call [place] office at [phone] to clarify.   YOU SHOULD EXPECT: Some feelings of bloating in the abdomen. Passage of more gas than usual. Walking can help get rid of the air that was put into your GI tract during the procedure and reduce the bloating. If you had a lower endoscopy (such as a colonoscopy or flexible sigmoidoscopy) you may notice spotting of blood in your stool or on the toilet paper. Some abdominal soreness may be present for a day or two, also.  DIET: Your first meal following the procedure should be a light meal and then it is ok to progress to your normal diet. A half-sandwich or bowl of soup is an example of a good first meal. Heavy or fried foods are harder to digest and may make you feel nauseous or bloated. Drink plenty of fluids but you should avoid alcoholic beverages for 24 hours. If you had a esophageal dilation, please see attached instructions for diet.    ACTIVITY: Your care partner should take you home directly after the procedure. You should plan to take it easy, moving slowly for the rest of the day. You can resume normal activity the day after the procedure however YOU SHOULD NOT DRIVE, use power tools, machinery or perform tasks that involve climbing or major physical exertion for 24 hours (because of the sedation medicines used during the test).   SYMPTOMS TO REPORT IMMEDIATELY: A gastroenterologist can be reached at any hour. Please call [phone]  for any of the following symptoms:   Following upper endoscopy (EGD, EUS, ERCP, esophageal dilation) Vomiting of blood or coffee ground material  New, significant abdominal pain  New, significant chest pain or pain under the shoulder blades  Painful or  persistently difficult swallowing  New shortness of breath  Black, tarry-looking or red, bloody stools  FOLLOW UP:  If any biopsies were taken you will be contacted by phone or by letter within the next 1-3 weeks. Call [phone]  if you have not heard about the biopsies in 3 weeks.  Please also call with any specific questions about appointments or follow up tests.  

## 2020-08-05 NOTE — H&P (Signed)
HPI: Anne [name] is a 70 year old female with history of GERD/hypertensive LES with possible Botox, vocal cord dysfunction with prior tracheostomy, history of COPD, chronic Plavix who presents for outpatient upper endoscopy.  Last endoscopy just over 1 year ago with Botox injection to the LES.  This definitely improves her swallowing dysfunction.  most recently she has had issues at times with poor esophageal clearance and vomiting.  She has been off Plavix for 3 days having forgot to stop it 5 days ago.  Past Medical History:  Diagnosis Date  . Allergic rhinitis   . Allergy    multiple, mostly aspirin, levaquin and shellfish.  . Anemia   . Asthma   . Cataract 2014   exraction with len implant  . Complication of anesthesia    Breathing problems upon waking up. 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  . Compressed cervical disc   . COPD (chronic obstructive pulmonary disease) ([place])   . CVA (cerebral infarction)   . Dyspnea   . Esophageal dysmotility   . Heart murmur    as child  . History of hiatal hernia   . IBS (irritable bowel syndrome)   . Migraine   . PICC (peripherally inserted central catheter) removal 02/20/2017  . PONV (postoperative nausea and vomiting)   . Problems with swallowing    intermittently  . Pulmonary fibrosis ([place])   . Shingles   . Stroke [facility])    slurred speech, drawn face, imaging normal, occurred twice, [facility]-"TIA" if antything" 02/20/17-no residual effects  . Tracheostomy in place [facility])   . Vocal cord paresis     Past Surgical History:  Procedure Laterality Date  . ABDOMINAL HYSTERECTOMY    . APPLICATION OF A-CELL OF HEAD/NECK N/A 02/21/2017   Procedure: APPLICATION OF A-CELL OF HEAD/NECK;  Surgeon: Wallace Going, DO;  Location: [place];  Service: Plastics;  Laterality: N/A;  . BOTOX INJECTION N/A 07/29/2013   Procedure: BOTOX INJECTION;  Surgeon: Jerene Bears, MD;  Location: [facility];  Service:  Gastroenterology;  Laterality: N/A;  . BOTOX INJECTION N/A 05/04/2015   Procedure: BOTOX INJECTION;  Surgeon: Jerene Bears, MD;  Location: [facility];  Service: Gastroenterology;  Laterality: N/A;  . BOTOX INJECTION N/A 02/18/2018   Procedure: BOTOX INJECTION;  Surgeon: Jerene Bears, MD;  [facility];  Service: Gastroenterology;  Laterality: N/A;  . BOTOX INJECTION N/A 06/30/2019   Procedure: BOTOX INJECTION;  Surgeon: Jerene Bears, MD;  [facility];  Service: Gastroenterology;  Laterality: N/A;  . BREAST BIOPSY Left    neg  . BREAST SURGERY Left 2002   bx of skin  . CHOLECYSTECTOMY    . COLONOSCOPY    . ESOPHAGEAL MANOMETRY N/A 12/16/2012   Procedure: ESOPHAGEAL MANOMETRY (EM);  Surgeon: Jerene Bears, MD;  [facility];  Service: Gastroenterology;  Laterality: N/A;  . ESOPHAGOGASTRODUODENOSCOPY (EGD) WITH PROPOFOL N/A 07/29/2013   Procedure: ESOPHAGOGASTRODUODENOSCOPY (EGD) WITH PROPOFOL;  Surgeon: Jerene Bears, MD;  [facility];  Service: Gastroenterology;  Laterality: N/A;  with botox injection  . ESOPHAGOGASTRODUODENOSCOPY (EGD) WITH PROPOFOL N/A 05/04/2015   Procedure: ESOPHAGOGASTRODUODENOSCOPY (EGD) WITH PROPOFOL;  Surgeon: Jerene Bears, MD;  [facility];  Service: Gastroenterology;  Laterality: N/A;  . ESOPHAGOGASTRODUODENOSCOPY (EGD) WITH PROPOFOL N/A 02/18/2018   Procedure: ESOPHAGOGASTRODUODENOSCOPY (EGD) WITH PROPOFOL;  Surgeon: Jerene Bears, MD;  [facility];  Service: Gastroenterology;  Laterality: N/A;  . ESOPHAGOGASTRODUODENOSCOPY (EGD) WITH PROPOFOL N/A 06/30/2019   Procedure:  ESOPHAGOGASTRODUODENOSCOPY (EGD) WITH PROPOFOL;  Surgeon: Jerene Bears, MD;  [facility];  Service: Gastroenterology;  Laterality: N/A;  . EYE SURGERY     Catarct surgery 2014  . Eye Surgery AS Child Left   . INCISION AND DRAINAGE OF WOUND N/A 02/21/2017   Procedure: IRRIGATION AND DEBRIDEMENT WOUND NECK;  Surgeon: Wallace Going, DO;  Location: [place];  Service: Plastics;  Laterality: N/A;  . JEJUNOSTOMY FEEDING TUBE     x2 both failed. no longer has  . LUMBAR LAMINECTOMY/DECOMPRESSION MICRODISCECTOMY Bilateral 05/13/2018   Procedure: Laminectomy and Foraminotomy - Lumbar four-Lumbar five- bilateral;  Surgeon: Eustace Moore, MD;  Location: [place];  Service: Neurosurgery;  Laterality: Bilateral;  . MULTIPLE TOOTH EXTRACTIONS     2 teeth removed  . OOPHORECTOMY    . POSTERIOR CERVICAL FUSION/FORAMINOTOMY N/A 01/10/2017   Procedure: LAMINECTOMY AND FORAMINOTOMY CERVICAL FOUR-CERVICAL FIVE, CERVICAL FIVE-SIX POSTERIOR CERVICAL INSTRUMENT FUSION CERVICAL THREE-CERVICAL SEVEN,CERVICAL LAMINECTOMY CERVICAL THREE-CERVICAL SEVEN.;  Surgeon: Eustace Moore, MD;  Location: [place];  Service: Neurosurgery;  Laterality: N/A;  posterior  . TRACHEOSTOMY  1996   done at [facility], Dr. Kathyrn Sheriff  . TUBAL LIGATION    . VIDEO BRONCHOSCOPY Bilateral 11/20/2012   Procedure: VIDEO BRONCHOSCOPY WITH FLUORO;  Surgeon: Juanito Doom, MD;  [facility];  Service: Cardiopulmonary;  Laterality: Bilateral;    (Not in an outpatient encounter)   Allergies  Allergen Reactions  . Asa [Aspirin] Shortness Of Breath, Swelling and Other (See Comments)    Tongue swells  . Bee Venom Anaphylaxis and Shortness Of Breath  . Epinephrine Shortness Of Breath and Palpitations    Occurs when pt is given double the dose, pt would use epipen as needed   . Feraheme [Ferumoxytol] Other (See Comments)    Throat squeezing  . Influenza Vaccines Shortness Of Breath and Other (See Comments)    Can take flu vaccines without eggs   . Pfizer-Biontech Covid-19 Vacc [Covid-19 Mrna Vacc (Moderna)] Anaphylaxis  . Quinolones Swelling and Other (See Comments)    Feet swell  . Shellfish Allergy Anaphylaxis  . Ciprofloxacin Swelling and Other (See Comments)    ALL MEDS ENDING IN -FLOXACIN MAKE THE FEET SWELL  . Clarithromycin Nausea Only  . Erythromycin  Nausea Only  . Levaquin [Levofloxacin In D5w] Swelling and Other (See Comments)    Feet and legs ache and SWELL  . Peanut-Containing Drug Products Other (See Comments)    Wheezing (boiled or raw peanuts)  . Septra [Sulfamethoxazole-Trimethoprim] Diarrhea  . Telithromycin Nausea Only  . Zanaflex [Tizanidine] Other (See Comments)    Caused the patient to feel "spaced out" and "not well"  . Adhesive [Tape] Rash and Other (See Comments)    Paper works tape works fine     Family History  Problem Relation Age of Onset  . Asthma Cousin   . COPD Cousin   . Breast cancer Maternal Grandmother 60  . Asthma Father   . Kidney cancer Father   . Arrhythmia Father   . Lung cancer Father   . Lung cancer Paternal Uncle   . COPD Paternal Grandfather   . Alcohol abuse Mother   . Breast cancer Maternal Aunt 35  . Bladder Cancer Neg Hx   . Colon cancer Neg Hx   . Esophageal cancer Neg Hx   . Pancreatic cancer Neg Hx   . Stomach cancer Neg Hx   . Liver disease Neg Hx     Social History  Tobacco Use  . Smoking status: Never Smoker  . Smokeless tobacco: Never Used  Vaping Use  . Vaping Use: Never used  Substance Use Topics  . Alcohol use: Yes    Alcohol/week: 0.0 standard drinks    Comment: occ.  . Drug use: No    ROS: As per history of present illness, otherwise negative  BP (!) 121/47   Temp 97.8 F (36.6 C) (Oral)   Resp (!) 8   Ht 5\' 3"  (1.6 m)   Wt 47.2 kg   SpO2 100%   BMI 18.42 kg/m  Gen: awake, alert, NAD HEENT: anicteric, op clear, trach in place CV: RRR Pulm: CTA b/l Abd: soft, NT/ND, +BS throughout Ext: no c/c/e Neuro: nonfocal  RELEVANT LABS AND IMAGING: CBC    Component Value Date/Time   WBC 4.9 01/13/2020 0907   RBC 4.17 01/13/2020 0907   HGB 13.4 01/13/2020 0907   HGB 12.6 02/17/2014 1303   HCT 39.3 01/13/2020 0907   HCT 37.9 02/17/2014 1303   PLT 199 01/13/2020 0907   PLT 208 02/17/2014 1303   MCV 94.2 01/13/2020 0907   MCV 97 02/17/2014 1303    MCH 32.1 01/13/2020 0907   MCHC 34.1 01/13/2020 0907   RDW 12.1 01/13/2020 0907   RDW 12.5 02/17/2014 1303   LYMPHSABS 1.5 01/13/2020 0907   LYMPHSABS 1.2 02/17/2014 1303   MONOABS 0.5 01/13/2020 0907   MONOABS 0.3 02/17/2014 1303   EOSABS 0.2 01/13/2020 0907   EOSABS 0.2 02/17/2014 1303   BASOSABS 0.0 01/13/2020 0907   BASOSABS 0.0 02/17/2014 1303    CMP     Component Value Date/Time   NA 139 07/22/2020 1525   K 3.7 07/22/2020 1525   K 3.6 04/03/2012 1653   CL 104 07/22/2020 1525   CO2 28 07/22/2020 1525   GLUCOSE 87 07/22/2020 1525   BUN 16 07/22/2020 1525   CREATININE 0.95 07/22/2020 1525   CREATININE 0.94 10/30/2016 1135   CALCIUM 9.0 07/22/2020 1525   PROT 6.5 10/31/2018 1035   ALBUMIN 4.1 10/31/2018 1035   AST 17 10/31/2018 1035   ALT 13 10/31/2018 1035   ALKPHOS 89 10/31/2018 1035   BILITOT 0.6 10/31/2018 1035   GFRNONAA >60 07/22/2020 1525   GFRAA >60 05/07/2018 1327    ASSESSMENT/PLAN: 70 year old female with history of GERD/hypertensive LES with possible Botox, vocal cord dysfunction with prior tracheostomy, history of COPD, chronic Plavix who presents for outpatient upper endoscopy.  1.  Hypertensive LES --Botox injection to the LES at EGD.  She has been off Plavix for 72 hours rather than 5 days initially recommended.  We discussed need nature of the procedure, as well as the risk benefits and alternatives.  Ample time for discussion questions.  The patient understood, satisfied and agreed to proceed.  There may be a slightly higher risk of bleeding associated with the procedure given Plavix use however without plans for dilation and only plans for injection the risk of bleeding is felt to be low.

## 2020-08-05 NOTE — Transfer of Care (Signed)
Immediate Anesthesia Transfer of Care Note  Patient: Anne [name]  Procedure(s) Performed: ESOPHAGOGASTRODUODENOSCOPY (EGD) WITH PROPOFOL (N/A ) BOTOX INJECTION (N/A )  Patient Location: PACU  Anesthesia Type:MAC  Level of Consciousness: awake and patient cooperative  Airway & Oxygen Therapy: Patient Spontanous Breathing and Patient connected to nasal cannula oxygen  Post-op Assessment: Report given to RN and Post -op Vital signs reviewed and stable  Post vital signs: Reviewed and stable  Last Vitals:  Vitals Value Taken Time  BP 96/36 08/05/20 1030  Temp 36.4 C 08/05/20 1030  Pulse 60 08/05/20 1034  Resp 13 08/05/20 1034  SpO2 100 % 08/05/20 1034  Vitals shown include unvalidated device data.  Last Pain:  Vitals:   08/05/20 1030  TempSrc: Oral  PainSc: 0-No pain         Complications: No complications documented.

## 2020-08-05 NOTE — Anesthesia Postprocedure Evaluation (Signed)
Anesthesia Post Note  Patient: TRANESHA LESSNER  Procedure(s) Performed: ESOPHAGOGASTRODUODENOSCOPY (EGD) WITH PROPOFOL (N/A ) BOTOX INJECTION (N/A )     Patient location during evaluation: Endoscopy Anesthesia Type: MAC Level of consciousness: awake and alert Pain management: pain level controlled Vital Signs Assessment: post-procedure vital signs reviewed and stable Respiratory status: spontaneous breathing, nonlabored ventilation and respiratory function stable Cardiovascular status: blood pressure returned to baseline and stable Postop Assessment: no apparent nausea or vomiting Anesthetic complications: no   No complications documented.  Last Vitals:  Vitals:   08/05/20 1050 08/05/20 1100  BP: (!) 129/58 (!) 139/56  Pulse: 72 60  Resp: 14 13  Temp:    SpO2: (!) 85% 100%    Last Pain:  Vitals:   08/05/20 1100  TempSrc:   PainSc: 0-No pain                 Merlinda Frederick

## 2020-08-05 NOTE — Anesthesia Preprocedure Evaluation (Addendum)
Anesthesia Evaluation  Patient identified by MRN, date of birth, ID band Patient awake    Reviewed: Allergy & Precautions, NPO status , Patient's Chart, lab work & pertinent test results  History of Anesthesia Complications (+) PONV and history of anesthetic complications  Airway Mallampati: I  TM Distance: >3 FB Neck ROM: Full    Dental  (+) Teeth Intact, Dental Advisory Given   Pulmonary asthma , COPD,  Pulmonary fibrosis   breath sounds clear to auscultation       Cardiovascular Exercise Tolerance: Good METS: 3 - Mets  Rhythm:Regular Rate:Normal     Neuro/Psych  Headaches, Anxiety CVA    GI/Hepatic Neg liver ROS, hiatal hernia, GERD  Medicated,dysphagia   Endo/Other  negative endocrine ROS  Renal/GU negative Renal ROS     Musculoskeletal  (+) Arthritis ,   Abdominal Normal abdominal exam  (+)   Peds  Hematology   Anesthesia Other Findings   Reproductive/Obstetrics                            Anesthesia Physical  Anesthesia Plan  ASA: III  Anesthesia Plan: MAC   Post-op Pain Management:    Induction: Intravenous  PONV Risk Score and Plan: 3 and Treatment may vary due to age or medical condition, Propofol infusion and TIVA  Airway Management Planned: Nasal Cannula and Natural Airway  Additional Equipment: None  Intra-op Plan:   Post-operative Plan:   Informed Consent: I have reviewed the patients History and Physical, chart, labs and discussed the procedure including the risks, benefits and alternatives for the proposed anesthesia with the patient or authorized representative who has indicated his/her understanding and acceptance.       Plan Discussed with: CRNA and Anesthesiologist  Anesthesia Plan Comments: (Patient has a capped trach in place, no inner cannula. Breathes easily via nose and mouth. Plan for nasal cannula and spontaneous ventilation. Norton Blizzard, MD   )       Anesthesia Quick Evaluation

## 2020-08-09 ENCOUNTER — Encounter (HOSPITAL_COMMUNITY): Payer: Self-pay | Admitting: Internal Medicine

## 2020-08-10 ENCOUNTER — Other Ambulatory Visit: Payer: Self-pay

## 2020-08-10 ENCOUNTER — Telehealth (HOSPITAL_COMMUNITY): Payer: Self-pay | Admitting: Emergency Medicine

## 2020-08-10 NOTE — Telephone Encounter (Signed)
Attempted to call patient regarding upcoming cardiac CT appointment. °Left message on voicemail with name and callback number °Milissa Fesperman RN Navigator Cardiac Imaging °[facility] °[phone] Office °[phone] Cell ° °

## 2020-08-11 ENCOUNTER — Telehealth (HOSPITAL_COMMUNITY): Payer: Self-pay | Admitting: Emergency Medicine

## 2020-08-11 NOTE — Telephone Encounter (Signed)
Reaching out to patient to offer assistance regarding upcoming cardiac imaging study; pt verbalizes understanding of appt date/time, parking situation and where to check in, pre-test NPO status and medications ordered, and verified current allergies; name and call back number provided for further questions should they arise Marchia Bond RN Navigator Cardiac Imaging [facility] [phone] office [phone] cell  12.5mg  metoprolol succ 2hr pta  Holding allergies Clarise Cruz

## 2020-08-12 ENCOUNTER — Ambulatory Visit
Admission: RE | Admit: 2020-08-12 | Discharge: 2020-08-12 | Disposition: A | Discharge status: Home / Self Care | Payer: Medicare Other | Source: Ambulatory Visit | Attending: Cardiovascular Disease | Admitting: Cardiovascular Disease

## 2020-08-12 ENCOUNTER — Other Ambulatory Visit: Payer: Self-pay

## 2020-08-12 DIAGNOSIS — I209 Angina pectoris, unspecified: Secondary | ICD-10-CM | POA: Diagnosis present

## 2020-08-12 MED ORDER — IOHEXOL 350 MG/ML SOLN
75.0000 mL | Freq: Once | INTRAVENOUS | Status: AC | PRN
  Administered 2020-08-12: 75 mL via INTRAVENOUS

## 2020-08-12 MED ORDER — NITROGLYCERIN 0.4 MG SL SUBL
0.8000 mg | SUBLINGUAL_TABLET | Freq: Once | SUBLINGUAL | Status: AC
  Administered 2020-08-12: 0.8 mg via SUBLINGUAL

## 2020-08-12 MED ORDER — METOPROLOL TARTRATE 5 MG/5ML IV SOLN
10.0000 mg | Freq: Once | INTRAVENOUS | Status: AC
  Administered 2020-08-12: 10 mg via INTRAVENOUS

## 2020-08-12 NOTE — Progress Notes (Signed)
Patient tolerated CT well. Drank water after. Vital signs stable encourage to drink water throughout day.Reasons explained and verbalized understanding. Ambulated steady gait.  

## 2020-08-17 ENCOUNTER — Other Ambulatory Visit: Payer: Self-pay

## 2020-08-17 ENCOUNTER — Ambulatory Visit (INDEPENDENT_AMBULATORY_CARE_PROVIDER_SITE_OTHER): Payer: Medicare Other | Admitting: Family Medicine

## 2020-08-17 ENCOUNTER — Encounter: Payer: Self-pay | Admitting: Family Medicine

## 2020-08-17 DIAGNOSIS — D509 Iron deficiency anemia, unspecified: Secondary | ICD-10-CM | POA: Diagnosis not present

## 2020-08-17 DIAGNOSIS — N289 Disorder of kidney and ureter, unspecified: Secondary | ICD-10-CM | POA: Diagnosis not present

## 2020-08-17 DIAGNOSIS — G8929 Other chronic pain: Secondary | ICD-10-CM

## 2020-08-17 DIAGNOSIS — E538 Deficiency of other specified B group vitamins: Secondary | ICD-10-CM | POA: Diagnosis not present

## 2020-08-17 DIAGNOSIS — M545 Low back pain, unspecified: Secondary | ICD-10-CM | POA: Diagnosis not present

## 2020-08-17 DIAGNOSIS — I209 Angina pectoris, unspecified: Secondary | ICD-10-CM

## 2020-08-17 DIAGNOSIS — E785 Hyperlipidemia, unspecified: Secondary | ICD-10-CM

## 2020-08-17 DIAGNOSIS — G629 Polyneuropathy, unspecified: Secondary | ICD-10-CM | POA: Diagnosis not present

## 2020-08-17 DIAGNOSIS — L989 Disorder of the skin and subcutaneous tissue, unspecified: Secondary | ICD-10-CM | POA: Insufficient documentation

## 2020-08-17 DIAGNOSIS — G43109 Migraine with aura, not intractable, without status migrainosus: Secondary | ICD-10-CM

## 2020-08-17 LAB — COMPREHENSIVE METABOLIC PANEL
ALT: 9 U/L (ref 0–35)
AST: 14 U/L (ref 0–37)
Albumin: 4 g/dL (ref 3.5–5.2)
Alkaline Phosphatase: 82 U/L (ref 39–117)
BUN: 16 mg/dL (ref 6–23)
CO2: 31 mEq/L (ref 19–32)
Calcium: 9.4 mg/dL (ref 8.4–10.5)
Chloride: 104 mEq/L (ref 96–112)
Creatinine, Ser: 0.91 mg/dL (ref 0.40–1.20)
GFR: 63.72 mL/min (ref >60.00)
Glucose, Bld: 82 mg/dL (ref 70–99)
Potassium: 3.8 mEq/L (ref 3.5–5.1)
Sodium: 141 mEq/L (ref 135–145)
Total Bilirubin: 0.6 mg/dL (ref 0.2–1.2)
Total Protein: 6.2 g/dL (ref 6.0–8.3)

## 2020-08-17 LAB — CBC
HCT: 39 % (ref 36.0–46.0)
Hemoglobin: 13.1 g/dL (ref 12.0–15.0)
MCHC: 33.6 g/dL (ref 30.0–36.0)
MCV: 96.5 fl (ref 78.0–100.0)
Platelets: 203 10*3/uL (ref 150.0–400.0)
RBC: 4.05 Mil/uL (ref 3.87–5.11)
RDW: 12.5 % (ref 11.5–15.5)
WBC: 4.7 10*3/uL (ref 4.0–10.5)

## 2020-08-17 LAB — IBC + FERRITIN
Ferritin: 65.4 ng/mL (ref 10.0–291.0)
Iron: 116 ug/dL (ref 42–145)
Saturation Ratios: 33.4 % (ref 20.0–50.0)
Transferrin: 248 mg/dL (ref 212.0–360.0)

## 2020-08-17 LAB — LIPID PANEL
Cholesterol: 189 mg/dL (ref 0–200)
HDL: 78.8 mg/dL (ref >39.00)
LDL Cholesterol: 87 mg/dL (ref 0–99)
NonHDL: 110.43
Total CHOL/HDL Ratio: 2
Triglycerides: 115 mg/dL (ref 0.0–149.0)
VLDL: 23 mg/dL (ref 0.0–40.0)

## 2020-08-17 LAB — VITAMIN B12: Vitamin B-12: 618 pg/mL (ref 211–911)

## 2020-08-17 MED ORDER — KETOCONAZOLE 2 % EX SHAM
1.0000 | MEDICATED_SHAMPOO | CUTANEOUS | 0 refills | Status: DC
Start: 2020-08-19 — End: 2021-05-26

## 2020-08-17 NOTE — Assessment & Plan Note (Signed)
She will keep her follow-up appointment with the kidney specialist.  She notes she is going to make a decision this time to either proceed with treatment for this or to defer any further follow-up.  She will discuss this with the urologist.

## 2020-08-17 NOTE — Patient Instructions (Signed)
Nice to see you. We will get lab work today. Please call the neurologist to schedule follow-up for the neuropathy. Please try the ketoconazole shampoo to see if that helps with the skin lesion in her scalp.

## 2020-08-17 NOTE — Assessment & Plan Note (Signed)
Patient with apparent neuropathy.  She will contact her neurologist at [facility] neurology to schedule an evaluation.

## 2020-08-17 NOTE — Assessment & Plan Note (Signed)
Well-controlled.  She will monitor. 

## 2020-08-17 NOTE — Assessment & Plan Note (Signed)
Continue monthly injections.  We will check a B12 level given her neuropathy.

## 2020-08-17 NOTE — Assessment & Plan Note (Signed)
Check lab work. 

## 2020-08-17 NOTE — Assessment & Plan Note (Signed)
Check lab

## 2020-08-17 NOTE — Progress Notes (Signed)
Tommi Rumps, MD Phone: [phone]  Anne [name] is a 70 y.o. female who presents today for f/u.  Kidney lesion: Suspicious for renal cell carcinoma.  She is following with urology at [facility].  She sees them in June for follow-up.  Iron deficiency anemia: Patient is no longer on an iron supplement.  She was not able to tolerate this.  She was seeing hematology.  She has been seen by GI for this as well.  B12 deficiency: She continues to get once monthly injections here in the office.  Migraines: Notes she only gets them once a month.  She has not taken much of the Maxalt.  Generally these are well controlled.  Chronic back pain: This has been an ongoing issue.  She follows with physical medicine and rehab.  They have tried several injections and the last one did not help.  She does have hydrocodone which she takes half a tablet of occasionally.  She tries to avoid these medications.  She was on gabapentin with with no benefit.  She notes she has trouble with decreased sensation in her feet and notes the physical medicine and rehab physician feels that this is neuropathy.  He has referred her to neurology for this.  Scalp lesion: Patient has skin lesion on top of her scalp.  Her hairdresser has noticed this.  She does get occasional itchy bumps on her scalp as well.  Social History   Tobacco Use  Smoking Status Never Smoker  Smokeless Tobacco Never Used    Current Outpatient Medications on File Prior to Visit  Medication Sig Dispense Refill  . acetaminophen (TYLENOL) 500 MG tablet Take 500 mg by mouth 2 (two) times daily as needed for moderate pain or headache.     . albuterol (VENTOLIN HFA) 108 (90 Base) MCG/ACT inhaler Inhale 2 puffs into the lungs every 6 (six) hours as needed for wheezing or shortness of breath. 18 g 2  . azelastine (ASTELIN) 0.1 % nasal spray Place 1 spray into both nostrils 2 (two) times daily. Use in each nostril as directed (Patient taking differently: Place  1 spray into both nostrils 2 (two) times daily as needed for rhinitis or allergies. Use in each nostril as directed) 30 mL 6  . budesonide (PULMICORT) 0.25 MG/2ML nebulizer solution Take 2 mLs (0.25 mg total) by nebulization 2 (two) times daily. Use after DuoNeb (Patient taking differently: Take 0.25 mg by nebulization 2 (two) times daily as needed (Asthma). Use after DuoNeb) 120 mL 11  . cetirizine (ZYRTEC) 10 MG tablet TAKE 1 TABLET BY MOUTH EVERY DAY (NOT COVERED (Patient taking differently: Take 10 mg by mouth daily.) 90 tablet 0  . clopidogrel (PLAVIX) 75 MG tablet TAKE 1 TABLET BY MOUTH EVERY DAY (Patient taking differently: Take 75 mg by mouth daily.) 90 tablet 1  . diphenhydrAMINE (BENADRYL) 25 MG tablet Take 25 mg by mouth daily as needed for allergies.     [name] EPINEPHrine 0.3 mg/0.3 mL IJ SOAJ injection Inject 0.3 mg into the muscle as needed for anaphylaxis.     [name] estradiol (ESTRACE) 1 MG tablet TAKE 1 TABLET BY MOUTH EVERY DAY (Patient taking differently: Take 1 mg by mouth daily.) 90 tablet 1  . fluticasone (FLONASE) 50 MCG/ACT nasal spray Place into both nostrils daily as needed for allergies or rhinitis.    . furosemide (LASIX) 20 MG tablet TAKE ONE TABLET DAILY AS NEEDED. (Patient taking differently: Take 20 mg by mouth daily.) 90 tablet 1  . guaiFENesin-codeine  100-10 MG/5ML syrup Take 5 mLs by mouth 2 (two) times daily as needed for cough. 120 mL 0  . HYDROcodone-homatropine (HYCODAN) 5-1.5 MG/5ML syrup Take 5 mLs by mouth every 8 (eight) hours as needed for cough. 120 mL 0  . ibuprofen (ADVIL,MOTRIN) 200 MG tablet Take 400-800 mg by mouth daily as needed for headache or moderate pain.    [name] ipratropium-albuterol (DUONEB) 0.5-2.5 (3) MG/3ML SOLN Take 3 mLs by nebulization every 4 (four) hours as needed. Dx 496 (Patient taking differently: Take 3 mLs by nebulization every 4 (four) hours as needed (asthma). Dx 496) 120 mL 2  . metoprolol succinate (TOPROL-XL) 25 MG 24 hr tablet TAKE 1/2  TABLET BY MOUTH EVERY DAY (Patient taking differently: Take 12.5 mg by mouth daily.) 45 tablet 0  . montelukast (SINGULAIR) 10 MG tablet TAKE 1 TABLET BY MOUTH EVERYDAY AT BEDTIME (Patient taking differently: Take 10 mg by mouth at bedtime.) 90 tablet 1  . mupirocin ointment (BACTROBAN) 2 % Apply 1 application topically daily as needed (FOR [facility] SITE IRRITATION).    . Naphazoline-Pheniramine 0.027-0.315 % SOLN Place 1 drop into both eyes daily as needed (allergies).     . pantoprazole (PROTONIX) 40 MG tablet TAKE ONE TABLET EVERY DAY NEEDS FOR OFFICE USE VISIT FOR MORE REFILLS. (Patient taking differently: Take 40 mg by mouth daily.) 90 tablet 2  . potassium chloride (KLOR-CON) 10 MEQ tablet Take 10 mEq by mouth daily as needed (low potassium).    . rizatriptan (MAXALT-MLT) 10 MG disintegrating tablet TAKE 1 TABLET (10 MG TOTAL) BY MOUTH DAILY AS NEEDED FOR MIGRAINE. MAY REPEAT FOR ONE DOSE 2 HOURS AFTER THE FIRST ONE IF NEEDED. 10 tablet 0  . sodium chloride (OCEAN) 0.65 % SOLN nasal spray Place 1 spray into both nostrils every 4 (four) hours as needed for congestion.     . traMADol (ULTRAM) 50 MG tablet Take 50 mg by mouth every 12 (twelve) hours as needed for severe pain.    . White Petrolatum-Mineral Oil (LUBRICANT EYE) OINT Place 1 application into both eyes 2 (two) times daily as needed (for dry eyes).     . [DISCONTINUED] zolmitriptan (ZOMIG-ZMT) 5 MG disintegrating tablet Take 1 tablet (5 mg total) by mouth daily as needed for migraine. 10 tablet 0   No current facility-administered medications on file prior to visit.     ROS see history of present illness  Objective  Physical Exam Vitals:   08/17/20 0836  BP: 110/60  Pulse: 61  Temp: 97.6 F (36.4 C)  SpO2: 98%    BP Readings from Last 3 Encounters:  08/17/20 110/60  08/12/20 (!) 102/46  08/05/20 (!) 139/56   Wt Readings from Last 3 Encounters:  08/17/20 105 lb 6.4 oz (47.8 kg)  08/05/20 104 lb (47.2 kg)  07/22/20 104  lb (47.2 kg)    Physical Exam Constitutional:      General: She is not in acute distress.    Appearance: She is not diaphoretic.  HENT:     Head:   Cardiovascular:     Rate and Rhythm: Normal rate and regular rhythm.     Heart sounds: Normal heart sounds.  Pulmonary:     Effort: Pulmonary effort is normal.     Breath sounds: Normal breath sounds.  Skin:    General: Skin is warm and dry.  Neurological:     Mental Status: She is alert.     Comments: 5/5 strength in bilateral biceps, triceps, grip, quads, hamstrings,  plantar and dorsiflexion, sensation to light touch slightly decreased in her bilateral feet otherwise intact in bilateral UE and LE, normal gait      Assessment/Plan: Please see individual problem list.  Problem List Items Addressed This Visit    HLD (hyperlipidemia)    Check lab.      Relevant Orders   Lipid panel   Comp Met (CMET)   B12 deficiency    Continue monthly injections.  We will check a B12 level given her neuropathy.      Relevant Orders   B12   Migraine    Well-controlled.  She will monitor.      Kidney lesion    She will keep her follow-up appointment with the kidney specialist.  She notes she is going to make a decision this time to either proceed with treatment for this or to defer any further follow-up.  She will discuss this with the urologist.      Iron deficiency anemia    Check lab work.      Relevant Orders   IBC + Ferritin   CBC   Low back pain    Chronic issue.  She will continue to see physical medicine and rehab.      Neuropathy    Patient with apparent neuropathy.  She will contact her neurologist at [facility] neurology to schedule an evaluation.      Scalp lesion    Undetermined cause though could represent a fungal issue.  We will treat with ketoconazole shampoo.  She will keep her scheduled appointment with dermatology in September.      Relevant Medications   ketoconazole (NIZORAL) 2 % shampoo (Start on  08/19/2020)      Return in about 6 months (around 02/17/2021).  This visit occurred during the SARS-CoV-2 public health emergency.  Safety protocols were in place, including screening questions prior to the visit, additional usage of staff PPE, and extensive cleaning of exam room while observing appropriate contact time as indicated for disinfecting solutions.    Tommi Rumps, MD [place]

## 2020-08-17 NOTE — Assessment & Plan Note (Signed)
Undetermined cause though could represent a fungal issue.  We will treat with ketoconazole shampoo.  She will keep her scheduled appointment with dermatology in September.

## 2020-08-17 NOTE — Assessment & Plan Note (Signed)
Chronic issue.  She will continue to see physical medicine and rehab.

## 2020-08-18 ENCOUNTER — Telehealth: Payer: Self-pay | Admitting: Family Medicine

## 2020-08-18 NOTE — Telephone Encounter (Signed)
Patient called in about her lab result on her iron she is concern  [phone]

## 2020-08-18 NOTE — Telephone Encounter (Signed)
LVM for the patient  to call back to let her know her iron level was good.  Also, to ask what her concern was.  Rylinn Linzy,cma

## 2020-08-18 NOTE — Telephone Encounter (Signed)
Her iron levels are in the normal range.  She is not anemic.  I do not think her iron levels are contributing to her tiredness and reported shortness of breath.  Please find out how long the shortness of breath has been going on for.  She would likely need to see cardiology or pulmonology if this has been an ongoing issue.

## 2020-08-18 NOTE — Telephone Encounter (Signed)
Her iron stores were acceptable.  Please find out what her concerns are.  Thanks.

## 2020-08-18 NOTE — Telephone Encounter (Signed)
The patient called back and is concerned for the overall picture of her iron levels.  She stated it keeps dropping and she is tired all the time and her SOB and wants to know if this could be the cause. Foy Vanduyne,cma

## 2020-08-19 ENCOUNTER — Encounter: Payer: Self-pay | Admitting: Family Medicine

## 2020-08-19 NOTE — Telephone Encounter (Signed)
I called patient & she stated that she has recently discussed SOB as well as following with pulmonology, she really isn't concerned abut this. Her concern is if you look at the trend of the ferritin, although normal range continues to drop by almost half every check. She really would like to check again in a few months if okay? She does not want to get to the point of needing infusions again. She wants to know what can be done to prevent this?

## 2020-08-20 NOTE — Telephone Encounter (Signed)
LMTCB

## 2020-08-20 NOTE — Telephone Encounter (Signed)
We can plan on rechecking in 3 months. She could start on an oral iron supplement and that may help though is not necessarily needed at this time.

## 2020-08-25 NOTE — Telephone Encounter (Signed)
Patient scheduled.

## 2020-08-28 ENCOUNTER — Other Ambulatory Visit: Payer: Self-pay | Admitting: Family Medicine

## 2020-09-07 ENCOUNTER — Ambulatory Visit (INDEPENDENT_AMBULATORY_CARE_PROVIDER_SITE_OTHER): Payer: Medicare Other

## 2020-09-07 ENCOUNTER — Other Ambulatory Visit: Payer: Self-pay

## 2020-09-07 DIAGNOSIS — E538 Deficiency of other specified B group vitamins: Secondary | ICD-10-CM | POA: Diagnosis not present

## 2020-09-07 MED ORDER — CYANOCOBALAMIN 1000 MCG/ML IJ SOLN
1000.0000 ug | Freq: Once | INTRAMUSCULAR | Status: AC
  Administered 2020-09-07: 1000 ug via INTRAMUSCULAR

## 2020-09-07 NOTE — Progress Notes (Addendum)
Patient presented for B 12 injection to left deltoid, patient voiced no concerns nor showed any signs of distress during injection. 

## 2020-09-22 ENCOUNTER — Telehealth: Payer: Self-pay | Admitting: Pulmonary Disease

## 2020-09-22 MED ORDER — PREDNISONE 20 MG PO TABS: 20.0000 mg | ORAL_TABLET | Freq: Every day | ORAL | 0 refills | Status: DC

## 2020-09-22 MED ORDER — DOXYCYCLINE HYCLATE 100 MG PO TABS: 100.0000 mg | ORAL_TABLET | Freq: Two times a day (BID) | ORAL | 0 refills | Status: DC

## 2020-09-22 NOTE — Telephone Encounter (Signed)
Patient is aware of below message/recommendations and voiced her understanding.  Rx for doxy and prednisone has been sent to preferred pharmacy.  Nothing further needed at this time.

## 2020-09-22 NOTE — Telephone Encounter (Signed)
Spoke to patient, who stated that she developed a cold two weeks ago. Negative covid test last week.  C/o lingering chest and head congestion, sneezing, nasal drainage and prod cough with yellow sputum.  Sob is baseline. Denied fever, chills or sweats. She is taking sudafed, zyrtec, mucinex, OTC tussin and albuterol 1-2x daily. She is not using Pulmicort due to headaches.   Dr. Patsey Berthold, please advise.

## 2020-09-22 NOTE — Telephone Encounter (Signed)
Lets do doxycycline 100 mg twice a day for 7 days.  Prednisone 20 mg p.o. daily x5 days.  Continue using albuterol try to use it twice a day to help with secretion clearance.

## 2020-09-24 ENCOUNTER — Other Ambulatory Visit: Payer: Self-pay | Admitting: Neurological Surgery

## 2020-09-24 DIAGNOSIS — M48061 Spinal stenosis, lumbar region without neurogenic claudication: Secondary | ICD-10-CM

## 2020-10-07 ENCOUNTER — Other Ambulatory Visit: Payer: Self-pay

## 2020-10-07 ENCOUNTER — Ambulatory Visit (INDEPENDENT_AMBULATORY_CARE_PROVIDER_SITE_OTHER): Payer: Medicare Other

## 2020-10-07 DIAGNOSIS — E538 Deficiency of other specified B group vitamins: Secondary | ICD-10-CM | POA: Diagnosis not present

## 2020-10-07 MED ORDER — CYANOCOBALAMIN 1000 MCG/ML IJ SOLN
1000.0000 ug | Freq: Once | INTRAMUSCULAR | Status: AC
  Administered 2020-10-07: 1000 ug via INTRAMUSCULAR

## 2020-10-07 NOTE — Progress Notes (Addendum)
Patient presented for B 12 injection to left deltoid, patient voiced no concerns nor showed any signs of distress during injection. 

## 2020-10-08 ENCOUNTER — Other Ambulatory Visit: Payer: Self-pay | Admitting: Family Medicine

## 2020-10-13 ENCOUNTER — Other Ambulatory Visit: Payer: Self-pay | Admitting: Family Medicine

## 2020-10-13 ENCOUNTER — Other Ambulatory Visit: Payer: Self-pay | Admitting: Family

## 2020-10-13 ENCOUNTER — Encounter: Payer: Self-pay | Admitting: Pulmonary Disease

## 2020-10-13 ENCOUNTER — Other Ambulatory Visit: Payer: Self-pay

## 2020-10-13 ENCOUNTER — Ambulatory Visit (INDEPENDENT_AMBULATORY_CARE_PROVIDER_SITE_OTHER): Payer: Medicare Other | Admitting: Pulmonary Disease

## 2020-10-13 VITALS — BP 98/60 | HR 67 | Temp 96.9°F | Ht 63.0 in | Wt 105.0 lb

## 2020-10-13 DIAGNOSIS — J4540 Moderate persistent asthma, uncomplicated: Secondary | ICD-10-CM

## 2020-10-13 DIAGNOSIS — Z930 Tracheostomy status: Secondary | ICD-10-CM | POA: Diagnosis not present

## 2020-10-13 DIAGNOSIS — Z01818 Encounter for other preprocedural examination: Secondary | ICD-10-CM

## 2020-10-13 DIAGNOSIS — J8410 Pulmonary fibrosis, unspecified: Secondary | ICD-10-CM | POA: Diagnosis not present

## 2020-10-13 MED ORDER — METHYLPREDNISOLONE 4 MG PO TBPK: ORAL_TABLET | ORAL | 0 refills | Status: DC

## 2020-10-13 NOTE — Progress Notes (Signed)
[facility]   Subjective:    Patient ID: Anne [name], female    DOB: Mar 16, 1951, 70 y.o.   MRN: [id number] Chief Complaint  Patient presents with   Follow-up    Congestion,hard time breathing, cough no production.    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 No frank honeycombing. Findings have progressed since  2014. 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 Chest x-ray 05/14/2020: Severe pleural-parenchymal scarring unchanged from prior tracheostomy tube in good position. Alpha-1 antitrypsin 06/14/2020: Normal level at 105 (she has known PI MZ). 2D echo 07/07/2020: LVEF 55 to 60%.  No wall motion abnormalities, grade II DD, aortic regurgitation mild to moderate. Coronary CT 08/12/2020: Coronary calcium score 0.   INTERVAL HISTORY: Last visit 06/14/2020.  Trach exchange 07/23/2020 by our [facility] in [place].    HPI Anne [name] presents today for follow-up.  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  This is a scheduled visit.  She presents with increased congestion and clear secretions coming out of the tracheostomy.  She does note however that her new tracheostomy works well for her.  She does not require mechanical ventilation at all.  She is only trach dependent due to prior UAO (mostly functional).  She has not had any fevers, chills or sweats.  She is supposed to be on Pulmicort twice a day  via nebulizer as well as needed bronchodilators however she is hesitant to use them due to multiple perceived side effects (tremulousness with bronchodilators and headache with Pulmicort).  She has not had any hemoptysis.  She has upcoming laminectomy in the L2-3 L3-4 vertebrae to be done under general anesthesia by Dr. Sherley Bounds.  We are assessing her also for preoperative evaluation.  She has not noted any fevers, chills or sweats.  No chest pain.  She has had nasal congestion and itchy watery eyes as well.  She otherwise feels well.  Mild dyspnea on exertion at baseline.   Review of Systems A 10 point review of systems was performed and it is as noted above otherwise negative.  Patient Active Problem List   Diagnosis Date Noted   Neuropathy 08/17/2020   Scalp lesion 08/17/2020   History of COVID-19 04/19/2020   Ganglion cyst of wrist, right 04/19/2020   Nontraumatic nail splitting 04/19/2020   Cough 02/26/2020   History of anaphylaxis 04/18/2019   Liver lesion 10/11/2018   S/P lumbar laminectomy 05/13/2018   Low back pain 02/07/2018   Iron deficiency anemia 12/11/2017   Mild persistent asthma 10/10/2017   Palpitations 08/03/2017   Bilateral leg edema 08/03/2017   Anxiety 08/03/2017   Current long-term use of postmenopausal hormone replacement therapy 03/19/2017   S/P cervical spinal fusion 01/10/2017   Kidney lesion 12/13/2016   Migraine 09/12/2016   Esophageal dysphagia 06/19/2016   Skin nodule 01/10/2016   Hypokalemia 01/10/2016   Dysuria 12/15/2015   Degenerative arthritis of cervical spine 10/28/2015   Gastritis    B12 deficiency 03/15/2015   Abdominal bloating 09/03/2014   Slow transit constipation 09/03/2014   Menopausal symptom 09/03/2014  Vocal cord dysfunction 07/15/2014   History of TIA (transient ischemic attack) 06/03/2014   Chronic pain syndrome 04/28/2014   Fatigue 09/30/2013   Hypertensive lower esophageal sphincter 07/29/2013   Achalasia of esophagus  07/29/2013   Edema 07/01/2013   Chronic pulmonary aspiration 06/06/2013   Insomnia 12/13/2012   HLD (hyperlipidemia) 12/13/2012   Anemia 08/13/2012   Pulmonary fibrosis ([place]) 08/05/2012   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   Allergies  Allergen Reactions   Asa [Aspirin] Shortness Of Breath, Swelling and Other (See Comments)    Tongue swells   Bee Venom Anaphylaxis and Shortness Of Breath   Epinephrine Shortness Of Breath and Palpitations    Occurs when pt is given double the dose, pt would use epipen as needed    Feraheme [Ferumoxytol] Other (See Comments)    Throat squeezing   Influenza Vaccines Shortness Of Breath and Other (See Comments)    Can take flu vaccines without eggs    Pfizer-Biontech Covid-19 Vacc [Covid-19 Mrna Vacc (Moderna)] Anaphylaxis   Quinolones Swelling and Other (See Comments)    Feet swell   Shellfish Allergy Anaphylaxis   Ciprofloxacin Swelling and Other (See Comments)    ALL MEDS ENDING IN -FLOXACIN MAKE THE FEET SWELL   Clarithromycin Nausea Only   Erythromycin Nausea Only   Levaquin [Levofloxacin In D5w] Swelling and Other (See Comments)    Feet and legs ache and SWELL   Peanut-Containing Drug Products Other (See Comments)    Wheezing (boiled or raw peanuts)   Septra [Sulfamethoxazole-Trimethoprim] Diarrhea   Telithromycin Nausea Only   Zanaflex [Tizanidine] Other (See Comments)    Caused the patient to feel "spaced out" and "not well"   Adhesive [Tape] Rash and Other (See Comments)    Paper works tape works fine    Current Meds  Medication Sig   acetaminophen (TYLENOL) 500 MG tablet Take 500 mg by mouth 2 (two) times daily as needed for moderate pain or headache.    albuterol (VENTOLIN HFA) 108 (90 Base) MCG/ACT inhaler Inhale 2 puffs into the lungs every 6  (six) hours as needed for wheezing or shortness of breath.   azelastine (ASTELIN) 0.1 % nasal spray Place 1 spray into both nostrils 2 (two) times daily. Use in each nostril as directed (Patient taking differently: Place 1 spray into both nostrils 2 (two) times daily as needed for rhinitis or allergies. Use in each nostril as directed)   budesonide (PULMICORT) 0.25 MG/2ML nebulizer solution Take 2 mLs (0.25 mg total) by nebulization 2 (two) times daily. Use after DuoNeb (Patient taking differently: Take 0.25 mg by nebulization 2 (two) times daily as needed (Asthma). Use after DuoNeb)   cetirizine (ZYRTEC) 10 MG tablet TAKE 1 TABLET BY MOUTH EVERY DAY (NOT COVERED   Cetirizine HCl (ZYRTEC ALLERGY) 10 MG CAPS    clopidogrel (PLAVIX) 75 MG tablet TAKE 1 TABLET BY MOUTH EVERY DAY (Patient taking differently: Take 75 mg by mouth daily.)   diphenhydrAMINE (BENADRYL) 25 MG tablet Take 25 mg by mouth daily as needed for allergies.    doxycycline (VIBRA-TABS) 100 MG tablet Take 1 tablet (100 mg total) by mouth 2 (two) times daily.   EPINEPHrine 0.3 mg/0.3 mL IJ SOAJ injection Inject 0.3 mg into the muscle as needed for anaphylaxis.  estradiol (ESTRACE) 1 MG tablet TAKE 1 TABLET BY MOUTH EVERY DAY (Patient taking differently: Take 1 mg by mouth daily.)   fluticasone (FLONASE) 50 MCG/ACT nasal spray Place into both nostrils daily as needed for allergies or rhinitis.   furosemide (LASIX) 20 MG tablet TAKE 1 TABLET BY MOUTH EVERY DAY AS NEEDED   gabapentin (NEURONTIN) 300 MG capsule take 1 capsule by oral route at bedtime   guaiFENesin-codeine 100-10 MG/5ML syrup Take 5 mLs by mouth 2 (two) times daily as needed for cough.   HYDROcodone-acetaminophen (NORCO/VICODIN) 5-325 MG tablet Take 1 tablet by mouth 2 (two) times daily as needed.   HYDROcodone-homatropine (HYCODAN) 5-1.5 MG/5ML syrup Take 5 mLs by mouth every 8 (eight) hours as needed for cough.   ibuprofen (ADVIL,MOTRIN) 200 MG tablet Take 400-800 mg by  mouth daily as needed for headache or moderate pain.   ipratropium-albuterol (DUONEB) 0.5-2.5 (3) MG/3ML SOLN Take 3 mLs by nebulization every 4 (four) hours as needed. Dx 496 (Patient taking differently: Take 3 mLs by nebulization every 4 (four) hours as needed (asthma). Dx 496)   ketoconazole (NIZORAL) 2 % shampoo Apply 1 application topically 2 (two) times a week.   methylPREDNISolone (MEDROL DOSEPAK) 4 MG TBPK tablet Take as directed in the package, this is a taper pack   metoprolol succinate (TOPROL-XL) 25 MG 24 hr tablet TAKE 1/2 TABLET BY MOUTH EVERY DAY   montelukast (SINGULAIR) 10 MG tablet TAKE 1 TABLET BY MOUTH EVERYDAY AT BEDTIME   mupirocin ointment (BACTROBAN) 2 % Apply 1 application topically daily as needed (FOR [facility] SITE IRRITATION).   Naphazoline-Pheniramine 0.027-0.315 % SOLN Place 1 drop into both eyes daily as needed (allergies).    pantoprazole (PROTONIX) 40 MG tablet TAKE ONE TABLET EVERY DAY NEEDS FOR OFFICE USE VISIT FOR MORE REFILLS. (Patient taking differently: Take 40 mg by mouth daily.)   potassium chloride (KLOR-CON) 10 MEQ tablet Take 10 mEq by mouth daily as needed (low potassium).   rizatriptan (MAXALT-MLT) 10 MG disintegrating tablet TAKE 1 TABLET (10 MG TOTAL) BY MOUTH DAILY AS NEEDED FOR MIGRAINE. MAY REPEAT FOR ONE DOSE 2 HOURS AFTER THE FIRST ONE IF NEEDED.   sodium chloride (OCEAN) 0.65 % SOLN nasal spray Place 1 spray into both nostrils every 4 (four) hours as needed for congestion.    traMADol (ULTRAM) 50 MG tablet Take 50 mg by mouth every 12 (twelve) hours as needed for severe pain.   White Petrolatum-Mineral Oil (LUBRICANT EYE) OINT Place 1 application into both eyes 2 (two) times daily as needed (for dry eyes).    [DISCONTINUED] methylPREDNISolone (MEDROL DOSEPAK) 4 MG TBPK tablet Take by mouth.   [DISCONTINUED] predniSONE (DELTASONE) 20 MG tablet Take 1 tablet (20 mg total) by mouth daily with breakfast.         Objective:   Physical Exam BP 98/60  (BP Location: Left Arm, Patient Position: Sitting, Cuff Size: Normal)   Pulse 67   Temp (!) 96.9 F (36.1 C) (Temporal)   Ht 5\' 3"  (1.6 m)   Wt 105 lb (47.6 kg)   SpO2 100%   BMI 18.60 kg/m  GENERAL: Well-developed, thin but not cachectic woman, in no acute distress.  She is fully ambulatory. HEAD: Normocephalic, atraumatic. EYES: Pupils equal, round, reactive to light.  No scleral icterus. MOUTH: Nose/mouth/throat not examined due to masking requirements for COVID 19. NECK: Supple.Trachea midline. No JVD.  No adenopathy.  Tracheostomy tube in place. PULMONARY: Good air entry bilaterally.    Coarse breath  sounds, no wheezes, no other adventitious sounds.   CARDIOVASCULAR: S1 and S2. Regular rate and rhythm.  No rubs, murmurs or gallops heard. ABDOMEN: Benign. MUSCULOSKELETAL: No joint deformity, no clubbing, no edema. NEUROLOGIC: No focal deficit noted.  No gait disturbance.  Phonates well with tracheostomy with speech valve. SKIN: Limited exam shows no rashes. PSYCH: Mood and behavior normal.     Assessment & Plan:     ICD-10-CM   1. Moderate persistent allergic asthma  J45.40    Appears to having an allergic flareup Medrol taper Encouraged to use bronchodilators    2. Chronic tracheostomy tube  Z93.0    Due to prior UAO Recent trach exchange 07/23/2020 No issues with new trach    3. 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 Pre-operative examination  Z01.818    From the pulmonary standpoint she is a moderate risk This cannot be further reduced     Meds ordered this encounter  Medications   methylPREDNISolone (MEDROL DOSEPAK) 4 MG TBPK tablet    Sig: Take as directed in the package, this is a taper pack    Dispense:  21 tablet    Refill:  0   Discussion:  [name] seems to be stable overall.  She has a mild flare of her allergic symptoms we will treat with a quick Medrol taper.  If this does not  help.  With regards to her upcoming surgery she knows that she is a moderate risk due to underlying lung issues however she does have tracheostomy in place and this will facilitate ventilation during the procedure.  However she will likely need a cuffed trach for ventilation as her current trach is uncuffed.  I will let our tracheostomy clinic folks ([place])  know to be aware of her upcoming surgery in case assistance is needed.  [name] has also been encouraged to use her nebulizers while she has her allergy flareup.  We will see her in follow-up in 3 to 4 weeks time she is to contact us prior to that time should any new difficulties arise.  Renold Don, MD Advanced Bronchoscopy P[facility]    *This note was dictated using voice recognition software/Dragon.  Despite best efforts to proofread, errors can occur which can change the meaning.  Any change was purely unintentional.

## 2020-10-13 NOTE — Patient Instructions (Signed)
We are giving you a Medrol Dosepak  Use your DuoNeb at least 3 times a day  We will see you in follow-up in 3 to 4 weeks time with either me or the nurse practitioner at that time want to make sure that you are doing well.

## 2020-10-14 ENCOUNTER — Ambulatory Visit: Payer: Medicare Other

## 2020-10-14 ENCOUNTER — Telehealth: Payer: Self-pay

## 2020-10-14 NOTE — Telephone Encounter (Signed)
Called patient for scheduled awv. No answer. VM not yet set up. Unable to leave a message.Reschedule as appropriate.

## 2020-10-20 ENCOUNTER — Telehealth: Payer: Self-pay | Admitting: Cardiovascular Disease

## 2020-10-20 NOTE — Telephone Encounter (Signed)
   [facility] Pre-operative Risk Assessment    Patient Name: Anne [name]  DOB: 15-Oct-1950 MRN: [id number]  HEARTCARE STAFF:  - IMPORTANT!!!!!! Under Visit Info/Reason for Call, type in Other and utilize the format Clearance MM/DD/YY or Clearance TBD. Do not use dashes or single digits. - Please review there is not already an duplicate clearance open for this procedure. - If request is for dental extraction, please clarify the # of teeth to be extracted. - If the patient is currently at the dentist's office, call Pre-Op Callback Staff (MA/nurse) to input urgent request.  - If the patient is not currently in the dentist office, please route to the Pre-Op pool.  Request for surgical clearance:  What type of surgery is being performed? L2-3, L3-4 LAMINECTOMY  When is this surgery scheduled? 11/08/20  What type of clearance is required (medical clearance vs. Pharmacy clearance to hold med vs. Both)? NOT LISTED  Are there any medications that need to be held prior to surgery and how long? NOT LISTED  Practice name and name of physician performing surgery? [facility] DR Sherley Bounds  What is the office phone number? [phone] X [phone]   7.   What is the office fax number? [phone]  8.   Anesthesia type (None, local, MAC, general) ? GENERAL   Elissa Hefty 10/20/2020, 1:01 PM  _________________________________________________________________   (provider comments below)

## 2020-10-21 NOTE — Telephone Encounter (Signed)
Unable to leave message on 10/21/2020 at 9:38 AM.  Patient needs call back.

## 2020-10-22 ENCOUNTER — Telehealth: Payer: Self-pay

## 2020-10-22 NOTE — Telephone Encounter (Signed)
Preoperative clearance form placed in to be sign basket

## 2020-10-26 NOTE — Telephone Encounter (Signed)
She needs a visit for surgical clearance.  This could be on 11/02/2020 at 1 PM.

## 2020-10-26 NOTE — Progress Notes (Signed)
Surgical Instructions    Your procedure is scheduled on 11/08/20.  Report to [facility] Main Entrance "A" at 05:30 A.M., then check in with the Admitting office.  Call this number if you have problems the morning of surgery:  [phone]   If you have any questions prior to your surgery date call [phone]: Open Monday-Friday 8am-4pm    Remember:  Do not eat or drink after midnight the night before your surgery     Take these medicines the morning of surgery with A SIP OF WATER  acetaminophen (TYLENOL) if needed albuterol (VENTOLIN HFA) 108 (90 Base) inhaler if needed azelastine (ASTELIN) nasal spray diphenhydrAMINE (BENADRYL) if needed estradiol (ESTRACE)  fluticasone (FLONASE) nasal spray if needed gabapentin (NEURONTIN) if needed HYDROcodone-acetaminophen (NORCO/VICODIN) if needed ipratropium-albuterol (DUONEB) nebulizer if needed metoprolol succinate (TOPROL-XL)  pantoprazole (PROTONIX) Eye drops rizatriptan (MAXALT-MLT) if needed [place] nasal spray if needed     As of today, STOP taking any Aspirin (unless otherwise instructed by your surgeon) Aleve, Naproxen, Ibuprofen, Motrin, Advil, Goody's, BC's, all herbal medications, fish oil, and all vitamins.  Please ask your doctor about when to stop clopidogrel (PLAVIX).          Do not wear jewelry or makeup Do not wear lotions, powders, perfumes/colognes, or deodorant. Do not shave 48 hours prior to surgery.  Men may shave face and neck. Do not bring valuables to the hospital. DO Not wear nail polish, gel polish, artificial nails, or any other type of covering on  natural nails including finger and toenails. If patients have artificial nails, gel coating, etc. that need to be removed by a nail salon please have this removed prior to surgery or surgery may need to be canceled/delayed if the surgeon/ anesthesia feels like the patient is unable to be adequately monitored.             [facility] is not responsible for any  belongings or valuables.  Do NOT Smoke (Tobacco/Vaping) or drink Alcohol 24 hours prior to your procedure If you use a CPAP at night, you may bring all equipment for your overnight stay.   Contacts, glasses, dentures or bridgework may not be worn into surgery, please bring cases for these belongings   For patients admitted to the hospital, discharge time will be determined by your treatment team.   Patients discharged the day of surgery will not be allowed to drive home, and someone needs to stay with them for 24 hours.  ONLY 1 SUPPORT PERSON MAY BE PRESENT WHILE YOU ARE IN SURGERY. IF YOU ARE TO BE ADMITTED ONCE YOU ARE IN YOUR ROOM YOU WILL BE ALLOWED TWO (2) VISITORS.  Minor children may have two parents present. Special consideration for safety and communication needs will be reviewed on a case by case basis.  Special instructions:    Oral Hygiene is also important to reduce your risk of infection.  Remember - BRUSH YOUR TEETH THE MORNING OF SURGERY WITH YOUR REGULAR TOOTHPASTE   [facility]- Preparing For Surgery  Before surgery, you can play an important role. Because skin is not sterile, your skin needs to be as free of germs as possible. You can reduce the number of germs on your skin by washing with CHG (chlorahexidine gluconate) Soap before surgery.  CHG is an antiseptic cleaner which kills germs and bonds with the skin to continue killing germs even after washing.     Please do not use if you have an allergy to CHG or antibacterial  soaps. If your skin becomes reddened/irritated stop using the CHG.  Do not shave (including legs and underarms) for at least 48 hours prior to first CHG shower. It is OK to shave your face.  Please follow these instructions carefully.     Shower the NIGHT BEFORE SURGERY and the MORNING OF SURGERY with CHG Soap.   If you chose to wash your hair, wash your hair first as usual with your normal shampoo. After you shampoo, rinse your hair and body  thoroughly to remove the shampoo.  Then [employer] and genitals (private parts) with your normal soap and rinse thoroughly to remove soap.  After that Use CHG Soap as you would any other liquid soap. You can apply CHG directly to the skin and wash gently with a scrungie or a clean washcloth.   Apply the CHG Soap to your body ONLY FROM THE NECK DOWN.  Do not use on open wounds or open sores. Avoid contact with your eyes, ears, mouth and genitals (private parts). Wash Face and genitals (private parts)  with your normal soap.   Wash thoroughly, paying special attention to the area where your surgery will be performed.  Thoroughly rinse your body with warm water from the neck down.  DO NOT shower/wash with your normal soap after using and rinsing off the CHG Soap.  Pat yourself dry with a CLEAN TOWEL.  Wear CLEAN PAJAMAS to bed the night before surgery  Place CLEAN SHEETS on your bed the night before your surgery  DO NOT SLEEP WITH PETS.   Day of Surgery:  Take a shower with CHG soap. Wear Clean/Comfortable clothing the morning of surgery Do not apply any deodorants/lotions.   Remember to brush your teeth WITH YOUR REGULAR TOOTHPASTE.   Please read over the following fact sheets that you were given.

## 2020-10-26 NOTE — Telephone Encounter (Signed)
I called the patient and scheduled her a medical clearance visit on 11/02/2020 per the provider. Patient understood and is scheduled.  Clebert Wenger,cma

## 2020-10-27 ENCOUNTER — Encounter (HOSPITAL_COMMUNITY)
Admission: RE | Admit: 2020-10-27 | Discharge: 2020-10-27 | Disposition: A | Discharge status: Home / Self Care | Payer: Medicare Other | Source: Ambulatory Visit | Attending: Neurological Surgery | Admitting: Neurological Surgery

## 2020-10-27 ENCOUNTER — Encounter (HOSPITAL_COMMUNITY): Payer: Self-pay

## 2020-10-27 ENCOUNTER — Other Ambulatory Visit: Payer: Self-pay

## 2020-10-27 DIAGNOSIS — Z930 Tracheostomy status: Secondary | ICD-10-CM | POA: Diagnosis not present

## 2020-10-27 DIAGNOSIS — M48061 Spinal stenosis, lumbar region without neurogenic claudication: Secondary | ICD-10-CM | POA: Diagnosis not present

## 2020-10-27 DIAGNOSIS — Z01818 Encounter for other preprocedural examination: Secondary | ICD-10-CM | POA: Diagnosis present

## 2020-10-27 LAB — CBC WITH DIFFERENTIAL/PLATELET
Abs Immature Granulocytes: 0.02 10*3/uL (ref 0.00–0.07)
Basophils Absolute: 0 10*3/uL (ref 0.0–0.1)
Basophils Relative: 1 %
Eosinophils Absolute: 0.2 10*3/uL (ref 0.0–0.5)
Eosinophils Relative: 4 %
HCT: 42.3 % (ref 36.0–46.0)
Hemoglobin: 14.4 g/dL (ref 12.0–15.0)
Immature Granulocytes: 0 %
Lymphocytes Relative: 23 %
Lymphs Abs: 1.3 10*3/uL (ref 0.7–4.0)
MCH: 32.8 pg (ref 26.0–34.0)
MCHC: 34 g/dL (ref 30.0–36.0)
MCV: 96.4 fL (ref 80.0–100.0)
Monocytes Absolute: 0.6 10*3/uL (ref 0.1–1.0)
Monocytes Relative: 11 %
Neutro Abs: 3.6 10*3/uL (ref 1.7–7.7)
Neutrophils Relative %: 61 %
Platelets: 196 10*3/uL (ref 150–400)
RBC: 4.39 MIL/uL (ref 3.87–5.11)
RDW: 12.2 % (ref 11.5–15.5)
WBC: 5.8 10*3/uL (ref 4.0–10.5)
nRBC: 0 % (ref 0.0–0.2)

## 2020-10-27 LAB — BASIC METABOLIC PANEL
Anion gap: 7 (ref 5–15)
BUN: 11 mg/dL (ref 8–23)
CO2: 31 mmol/L (ref 22–32)
Calcium: 9.6 mg/dL (ref 8.9–10.3)
Chloride: 102 mmol/L (ref 98–111)
Creatinine, Ser: 0.97 mg/dL (ref 0.44–1.00)
GFR, Estimated: >60 mL/min (ref >60)
Glucose, Bld: 66 mg/dL — ABNORMAL LOW (ref 70–99)
Potassium: 3.2 mmol/L — ABNORMAL LOW (ref 3.5–5.1)
Sodium: 140 mmol/L (ref 135–145)

## 2020-10-27 LAB — PROTIME-INR
INR: 1 (ref 0.8–1.2)
Prothrombin Time: 13.1 seconds (ref 11.4–15.2)

## 2020-10-27 LAB — SURGICAL PCR SCREEN
MRSA, PCR: NEGATIVE
Staphylococcus aureus: NEGATIVE

## 2020-10-27 NOTE — Progress Notes (Signed)
PCP - Dr. Tommi Rumps Cardiologist - Dr. Fletcher Anon Asthma Dr. Vernard Gambles  Chest x-ray - 10/27/20 EKG - 10/27/20 Stress Test - 02/03/14 ECHO - 07/07/20  Blood Thinner Instructions: Plavix last dose on 10/31/20 per Dr. Ronnald Ramp office   COVID TEST- Instructed to go to the Covid test site    Anesthesia review: Yes Cardiac history  Patient denies shortness of breath, fever, cough and chest pain at PAT appointment   All instructions explained to the patient, with a verbal understanding of the material. Patient agrees to go over the instructions while at home for a better understanding. Patient also instructed to wear a mask while in public after being tested for COVID-19. The opportunity to ask questions was provided.

## 2020-10-27 NOTE — Telephone Encounter (Signed)
    Patient Name: Anne [name]  DOB: 21-Mar-1951 MRN: [id number]  Primary Cardiologist: Kathlyn Sacramento, MD  Chart reviewed as part of pre-operative protocol coverage. Given past medical history and time since last visit, based on ACC/AHA guidelines, Anne [name] would be at acceptable risk for the planned procedure without further cardiovascular testing.   Recent coronary CT obtained in May 2022 showed clean coronary arteries.  Patient does not have any coronary artery disease.  I will route this recommendation to the requesting party via Epic fax function and remove from pre-op pool.  Please call with questions.  [place], [place] 10/27/2020, 5:41 PM

## 2020-10-28 NOTE — Progress Notes (Signed)
Anesthesia Chart Review:  Follows with cardiology for history of palpitations and atypical chest pain.  She also history of possible TIA in the past and has been on Plavix for that reason.  More recently she was seen by Dr. Fletcher Anon on 07/22/2020 and noted to have exertional dyspnea concerning for possible anginal equivalent.  Coronary CT was done on 08/12/2020 and showed coronary calcium score of 0, and no evidence of CAD.  Dr. Fletcher Anon commented on results stating, "Inform patient that cardiac CTA showed a calcium score of 0 with no evidence ofcoronary artery disease.  This is excellent news. CT scan of the lungs did show small area of mucous plugging in the right lowerlobe.  I am forwarding this to Dr. Patsey Berthold and Dr. [name] for their input."  Pulmonologist Dr. Patsey Berthold also quoted stating, "Intermittent mucous plugging not unusual in this patient given her baseline pulmonary issues.  Noted.  She has in situ tracheostomy and does pulmonary toilet."  Cardiac clearance per telephone encounter 10/27/2020, "Chart reviewed as part of pre-operative protocol coverage. Given past medical history and time since last visit, based on ACC/AHA guidelines, Anne [name] would be at acceptable risk for the planned procedure without further cardiovascular testing. Recent coronary CT obtained in May 2022 showed clean coronary arteries.  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  Seen by PCP Dr. [name] 11/03/20 for preop eval. Per note, "The patient presents for medical clearance.  She has already received clearance from her cardiologist and pulmonologist.  Medically she is low risk for this surgery.  We will start her on a potassium supplement and recheck that on Friday." Potassium recheck was 3.8 on 10/27/20.  Patient reported last dose Plavix  10/31/2020.  5.5 mm cuffed ETT has been used successfully for previous intubations.  Preop labs reviewed, unremarkable.  EKG 10/27/2020: Sinus bradycardia with 1st degree A-V block. Rate 55. Septal infarct , age undetermined. No significant change since last tracing  Coronary CT 08/12/2020: IMPRESSION: 1. Normal coronary calcium score of 0. Patient is low risk for coronary events.   2. Normal coronary origin with right dominance.   3. No evidence of CAD.   4. CAD-RADS 0. Consider non-atherosclerotic causes of chest pain.  TTE 07/07/2020:  1. Left ventricular ejection fraction, by estimation, is 55 to 60%. The  left ventricle has normal function. The left ventricle has no regional  wall motion abnormalities. Left ventricular diastolic parameters are  consistent with Grade II diastolic  dysfunction (pseudonormalization).   2. Right ventricular systolic function is normal. The right ventricular  size is normal.   3. The mitral valve is normal in structure. No evidence of mitral valve  regurgitation.   4. The aortic valve was not well visualized. Aortic valve regurgitation  is mild to moderate.   5. The inferior vena cava is normal in size with greater than 50%  respiratory variability, suggesting right atrial pressure of 3 mmHg.   2 Hr event monitor 10/17/17: Normal sinus rhythm. Average heart rate of 67 bpm. 1 run of SVT lasting only 6 beats with a rate of 126 bpm. Rare PACs.    Wynonia Musty [facility]/Anesthesiology Phone [phone] 11/05/2020 3:26 PM

## 2020-10-29 ENCOUNTER — Other Ambulatory Visit: Payer: Medicare Other

## 2020-11-01 ENCOUNTER — Telehealth: Payer: Self-pay | Admitting: Family Medicine

## 2020-11-01 NOTE — Telephone Encounter (Signed)
Patient would like for Dr.Sonnenberg to know that she had an EKG and blood work done at [facility].

## 2020-11-02 ENCOUNTER — Ambulatory Visit: Payer: Medicare Other | Admitting: Family Medicine

## 2020-11-02 NOTE — Telephone Encounter (Signed)
I called the patient back and she has had a prescreening done at the hospital, she had her labs, EKG and everything is in the chart, she just needs the provider to sign off on it and if she needs another visit it would have to be before Monday that is her surgery date.  The forms are in the sign basket for your signature.  Melonie Germani,cma

## 2020-11-02 NOTE — Telephone Encounter (Signed)
Patient is scheduled for a visit for tomorrow per Dr. [name].  ng

## 2020-11-03 ENCOUNTER — Other Ambulatory Visit: Payer: Self-pay

## 2020-11-03 ENCOUNTER — Ambulatory Visit (INDEPENDENT_AMBULATORY_CARE_PROVIDER_SITE_OTHER): Payer: Medicare Other | Admitting: Family Medicine

## 2020-11-03 ENCOUNTER — Encounter: Payer: Self-pay | Admitting: Family Medicine

## 2020-11-03 VITALS — BP 120/70 | HR 62 | Temp 98.2°F | Ht 63.0 in | Wt 103.4 lb

## 2020-11-03 DIAGNOSIS — E876 Hypokalemia: Secondary | ICD-10-CM

## 2020-11-03 DIAGNOSIS — I209 Angina pectoris, unspecified: Secondary | ICD-10-CM | POA: Diagnosis not present

## 2020-11-03 DIAGNOSIS — Z01818 Encounter for other preprocedural examination: Secondary | ICD-10-CM

## 2020-11-03 MED ORDER — POTASSIUM CHLORIDE 20 MEQ/15ML (10%) PO SOLN: 40.0000 meq | Freq: Every day | ORAL | 0 refills | Status: DC

## 2020-11-03 NOTE — Patient Instructions (Signed)
Nice to see you. Please start the potassium supplement.  We will recheck your potassium later this week.

## 2020-11-03 NOTE — Progress Notes (Signed)
Tommi Rumps, MD Phone: [phone]  Anne [name] is a 70 y.o. female who presents today for surgical clearance.   The patient presents for surgical clearance visit.  She plans to have a lumbar spine fusion.  She notes no chest pain or breathlessness when climbing stairs.  She does have a history of palpitations in the past though no diagnosis of an irregular heartbeat.  She has had a TIA in the past.  She notes occasional vomiting when she has anesthesia though no other anesthetic issues.  She does have pulmonary fibrosis.  She has seen pulmonology and was advised that she was moderate risk and this could not be modified.  She has received cardiac clearance as well.  She notes no history of seizures, thyroid disease, angina, liver disease, or heart failure.  No history of diabetes.  She has already stopped her Plavix as she was advised to stop this 1 week ahead of her surgery.  Recent labs revealed a low potassium.  Social History   Tobacco Use  Smoking Status Never  Smokeless Tobacco Never    Current Outpatient Medications on File Prior to Visit  Medication Sig Dispense Refill   acetaminophen (TYLENOL) 500 MG tablet Take 1,000 mg by mouth 2 (two) times daily as needed for moderate pain or headache.     albuterol (VENTOLIN HFA) 108 (90 Base) MCG/ACT inhaler Inhale 2 puffs into the lungs every 6 (six) hours as needed for wheezing or shortness of breath. 18 g 2   azelastine (ASTELIN) 0.1 % nasal spray Place 1 spray into both nostrils 2 (two) times daily. Use in each nostril as directed (Patient taking differently: Place 1 spray into both nostrils 2 (two) times daily as needed for rhinitis or allergies. Use in each nostril as directed) 30 mL 6   budesonide (PULMICORT) 0.25 MG/2ML nebulizer solution Take 2 mLs (0.25 mg total) by nebulization 2 (two) times daily. Use after DuoNeb 120 mL 11   cetirizine (ZYRTEC) 10 MG tablet TAKE 1 TABLET BY MOUTH EVERY DAY (NOT COVERED (Patient taking  differently: Take 10 mg by mouth every evening.) 90 tablet 0   clopidogrel (PLAVIX) 75 MG tablet TAKE 1 TABLET BY MOUTH EVERY DAY (Patient taking differently: Take 75 mg by mouth in the morning.) 90 tablet 1   diphenhydrAMINE (BENADRYL) 25 MG tablet Take 25 mg by mouth daily as needed for allergies.      doxycycline (VIBRA-TABS) 100 MG tablet Take 1 tablet (100 mg total) by mouth 2 (two) times daily. 14 tablet 0   EPINEPHrine 0.3 mg/0.3 mL IJ SOAJ injection Inject 0.3 mg into the muscle as needed for anaphylaxis.      estradiol (ESTRACE) 1 MG tablet TAKE 1 TABLET BY MOUTH EVERY DAY (Patient taking differently: Take 1 mg by mouth daily.) 90 tablet 1   fluticasone (FLONASE) 50 MCG/ACT nasal spray Place into both nostrils daily as needed for allergies or rhinitis.     furosemide (LASIX) 20 MG tablet TAKE 1 TABLET BY MOUTH EVERY DAY AS NEEDED (Patient taking differently: Take 20 mg by mouth daily. TAKE 1 TABLET BY MOUTH EVERY DAY) 90 tablet 1   gabapentin (NEURONTIN) 300 MG capsule Take 300 mg by mouth 3 (three) times daily as needed (pain).     guaiFENesin-codeine 100-10 MG/5ML syrup Take 5 mLs by mouth 2 (two) times daily as needed for cough. 120 mL 0   HYDROcodone-acetaminophen (NORCO/VICODIN) 5-325 MG tablet Take 1 tablet by mouth 2 (two) times daily as needed  for severe pain.     HYDROcodone-homatropine (HYCODAN) 5-1.5 MG/5ML syrup Take 5 mLs by mouth every 8 (eight) hours as needed for cough. 120 mL 0   ibuprofen (ADVIL,MOTRIN) 200 MG tablet Take 400-800 mg by mouth daily as needed for headache or moderate pain.     ipratropium-albuterol (DUONEB) 0.5-2.5 (3) MG/3ML SOLN Take 3 mLs by nebulization every 4 (four) hours as needed. Dx 496 (Patient taking differently: Take 3 mLs by nebulization every 4 (four) hours as needed (asthma). Dx 496) 120 mL 2   ketoconazole (NIZORAL) 2 % shampoo Apply 1 application topically 2 (two) times a week. 120 mL 0   methylPREDNISolone (MEDROL DOSEPAK) 4 MG TBPK tablet  Take as directed in the package, this is a taper pack 21 tablet 0   metoprolol succinate (TOPROL-XL) 25 MG 24 hr tablet TAKE 1/2 TABLET BY MOUTH EVERY DAY (Patient taking differently: Take 12.5 mg by mouth daily.) 45 tablet 3   montelukast (SINGULAIR) 10 MG tablet TAKE 1 TABLET BY MOUTH EVERYDAY AT BEDTIME (Patient taking differently: Take 10 mg by mouth at bedtime.) 90 tablet 1   mupirocin ointment (BACTROBAN) 2 % Apply 1 application topically daily as needed (FOR [facility] SITE IRRITATION).     Naphazoline-Pheniramine 0.027-0.315 % SOLN Place 1 drop into both eyes daily as needed (allergies).      pantoprazole (PROTONIX) 40 MG tablet TAKE ONE TABLET EVERY DAY NEEDS FOR OFFICE USE VISIT FOR MORE REFILLS. (Patient taking differently: Take 40 mg by mouth daily.) 90 tablet 2   potassium chloride (KLOR-CON) 10 MEQ tablet Take 10 mEq by mouth daily as needed (low potassium).     rizatriptan (MAXALT-MLT) 10 MG disintegrating tablet TAKE 1 TABLET (10 MG TOTAL) BY MOUTH DAILY AS NEEDED FOR MIGRAINE. MAY REPEAT FOR ONE DOSE 2 HOURS AFTER THE FIRST ONE IF NEEDED. 10 tablet 0   sodium chloride (OCEAN) 0.65 % SOLN nasal spray Place 1 spray into both nostrils every 4 (four) hours as needed for congestion.      White Petrolatum-Mineral Oil (LUBRICANT EYE) OINT Place 1 application into both eyes 2 (two) times daily as needed (for dry eyes).      [DISCONTINUED] zolmitriptan (ZOMIG-ZMT) 5 MG disintegrating tablet Take 1 tablet (5 mg total) by mouth daily as needed for migraine. 10 tablet 0   No current facility-administered medications on file prior to visit.     ROS see history of present illness  Objective  Physical Exam Vitals:   11/03/20 1625  BP: 120/70  Pulse: 62  Temp: 98.2 F (36.8 C)  SpO2: 97%    BP Readings from Last 3 Encounters:  11/03/20 120/70  10/27/20 (!) 103/43  10/13/20 98/60   Wt Readings from Last 3 Encounters:  11/03/20 103 lb 6.4 oz (46.9 kg)  10/27/20 103 lb (46.7 kg)   10/13/20 105 lb (47.6 kg)    Physical Exam Constitutional:      General: She is not in acute distress.    Appearance: She is not diaphoretic.  Cardiovascular:     Rate and Rhythm: Normal rate and regular rhythm.     Heart sounds: Normal heart sounds.  Pulmonary:     Effort: Pulmonary effort is normal.     Breath sounds: Normal breath sounds.  Abdominal:     General: Bowel sounds are normal. There is no distension.     Palpations: Abdomen is soft.     Tenderness: There is no abdominal tenderness. There is no guarding or rebound.  Musculoskeletal:     Right lower leg: No edema.     Left lower leg: No edema.  Skin:    General: Skin is warm and dry.  Neurological:     Mental Status: She is alert.     Assessment/Plan: Please see individual problem list.  Problem List Items Addressed This Visit     Hypokalemia - Primary   Relevant Medications   potassium chloride 20 MEQ/15ML (10%) SOLN   Other Relevant Orders   Potassium   Preoperative clearance    The patient presents for medical clearance.  She has already received clearance from her cardiologist and pulmonologist.  Medically she is low risk for this surgery.  We will start her on a potassium supplement and recheck that on Friday.       Return for as scheduled. .  This visit occurred during the SARS-CoV-2 public health emergency.  Safety protocols were in place, including screening questions prior to the visit, additional usage of staff PPE, and extensive cleaning of exam room while observing appropriate contact time as indicated for disinfecting solutions.    Tommi Rumps, MD [place]

## 2020-11-04 DIAGNOSIS — Z01818 Encounter for other preprocedural examination: Secondary | ICD-10-CM | POA: Insufficient documentation

## 2020-11-04 NOTE — Assessment & Plan Note (Signed)
The patient presents for medical clearance.  She has already received clearance from her cardiologist and pulmonologist.  Medically she is low risk for this surgery.  We will start her on a potassium supplement and recheck that on Friday.

## 2020-11-05 ENCOUNTER — Other Ambulatory Visit: Payer: Self-pay | Admitting: Neurological Surgery

## 2020-11-05 ENCOUNTER — Other Ambulatory Visit: Payer: Self-pay | Admitting: Family Medicine

## 2020-11-05 ENCOUNTER — Other Ambulatory Visit (INDEPENDENT_AMBULATORY_CARE_PROVIDER_SITE_OTHER): Payer: Medicare Other

## 2020-11-05 ENCOUNTER — Other Ambulatory Visit: Payer: Self-pay

## 2020-11-05 DIAGNOSIS — N951 Menopausal and female climacteric states: Secondary | ICD-10-CM

## 2020-11-05 DIAGNOSIS — E876 Hypokalemia: Secondary | ICD-10-CM

## 2020-11-05 LAB — POTASSIUM: Potassium: 3.8 mEq/L (ref 3.5–5.1)

## 2020-11-05 LAB — SARS CORONAVIRUS 2 (TAT 6-24 HRS): SARS Coronavirus 2: NEGATIVE

## 2020-11-05 NOTE — Anesthesia Preprocedure Evaluation (Addendum)
Anesthesia Evaluation  Patient identified by MRN, date of birth, ID band Patient awake    Reviewed: Allergy & Precautions, NPO status , Patient's Chart, lab work & pertinent test results  History of Anesthesia Complications (+) PONV  Airway Mallampati: Trach  TM Distance: >3 FB Neck ROM: Full    Dental  (+) Missing   Pulmonary asthma , COPD,  COPD inhaler,  Pulmonary fibrosis Trach   Pulmonary exam normal breath sounds clear to auscultation       Cardiovascular negative cardio ROS Normal cardiovascular exam Rhythm:Regular Rate:Normal  ECG: SB, rate 55   ECHO: 1. Left ventricular ejection fraction, by estimation, is 55 to 60%. The left ventricle has normal function. The left ventricle has no regional wall motion abnormalities. Left ventricular diastolic parameters are consistent with Grade II diastolic dysfunction (pseudonormalization). 2. Right ventricular systolic function is normal. The right ventricular size is normal. 3. The mitral valve is normal in structure. No evidence of mitral valve regurgitation. 4. The aortic valve was not well visualized. Aortic valve regurgitation is mild to moderate. 5. The inferior vena cava is normal in size with greater than 50% respiratory variability, suggesting right atrial pressure of 3 mmHg.   Neuro/Psych  Headaches, Anxiety CVA, No Residual Symptoms    GI/Hepatic Neg liver ROS, hiatal hernia, IBS (irritable bowel syndrome)   Endo/Other  negative endocrine ROS  Renal/GU negative Renal ROS     Musculoskeletal  (+) Arthritis ,   Abdominal   Peds  Hematology negative hematology ROS (+)   Anesthesia Other Findings Lumbar Stenosis  Reproductive/Obstetrics                            Anesthesia Physical Anesthesia Plan  ASA: 3  Anesthesia Plan: General   Post-op Pain Management:    Induction: Intravenous  PONV Risk Score and Plan: 4 or  greater and Ondansetron, Dexamethasone, Midazolam, Propofol infusion and Treatment may vary due to age or medical condition  Airway Management Planned: Tracheostomy  Additional Equipment:   Intra-op Plan:   Post-operative Plan:   Informed Consent: I have reviewed the patients History and Physical, chart, labs and discussed the procedure including the risks, benefits and alternatives for the proposed anesthesia with the patient or authorized representative who has indicated his/her understanding and acceptance.       Plan Discussed with: CRNA  Anesthesia Plan Comments: (PAT note by Karoline Caldwell, PA-C:  Follows with cardiology for history of palpitations and atypical chest pain.  She also history of possible TIA in the past and has been on Plavix for that reason.  More recently she was seen by Dr. Fletcher Anon on 07/22/2020 and noted to have exertional dyspnea concerning for possible anginal equivalent.  Coronary CT was done on 08/12/2020 and showed coronary calcium score of 0, and no evidence of CAD.  Dr. Fletcher Anon commented on results stating, "Inform patient that cardiac CTA showed a calcium score of 0 with no evidence ofcoronary artery disease. This is excellent news. CT scan of the lungs did show small area of mucous plugging in the right lowerlobe. I am forwarding this to Dr. Patsey Berthold and Dr. [name] for their input."  Pulmonologist Dr. Patsey Berthold also quoted stating, "Intermittent mucous plugging not unusual in this patient given her baseline pulmonary issues. Noted. She has in situ tracheostomy and does pulmonary toilet."  Cardiac clearance per telephone encounter 10/27/2020, "Chart reviewed as part of pre-operative protocol coverage. Given past medical history and  time since last visit, based on ACC/AHA guidelines,Deanndra G Joycewould be at acceptable risk for the planned procedure without further cardiovascular testing. Recent coronary CT obtained in May 2022 showed clean coronary arteries.  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  Seen by PCP Dr. [name] 11/03/20 for preop eval. Per note, "The patient presents for medical clearance. She has already received clearance from her cardiologist and pulmonologist. Medically she is low risk for this surgery. We will start her on a potassium supplement and recheck that on Friday." Potassium recheck was 3.8 on 10/27/20.  Patient reported last dose Plavix 10/31/2020.  5.5 mm cuffed ETT has been used successfully for previous intubations.  Preop labs reviewed, unremarkable.  EKG 10/27/2020: Sinus bradycardia with 1st degree A-V block. Rate 55. Septal infarct , age undetermined. No significant change since last tracing  Coronary CT 08/12/2020: IMPRESSION: 1. Normal coronary calcium score of 0. Patient is low risk for coronary events.  2. Normal coronary origin with right dominance.  3. No evidence of CAD.  4. CAD-RADS 0. Consider non-atherosclerotic causes of chest pain.  TTE 07/07/2020: 1. Left ventricular ejection fraction, by estimation, is 55 to 60%. The  left ventricle has normal function. The left ventricle has no regional  wall motion abnormalities. Left ventricular diastolic parameters are  consistent with Grade II diastolic  dysfunction (pseudonormalization).  2. Right ventricular systolic function is normal. The right ventricular  size is normal.  3. The mitral valve is normal in structure. No evidence of mitral valve  regurgitation.  4. The aortic valve was not well visualized. Aortic valve regurgitation  is mild to moderate.  5. The inferior vena cava is normal in size with greater than 50%  respiratory variability, suggesting right atrial pressure of 3 mmHg.   42 Hr event monitor 10/17/17: Normal sinus rhythm. Average heart rate of  67 bpm. 1 run of SVT lasting only 6 beats with a rate of 126 bpm. Rare PACs.  )      Anesthesia Quick Evaluation

## 2020-11-08 ENCOUNTER — Encounter (HOSPITAL_COMMUNITY): Payer: Self-pay | Admitting: Neurological Surgery

## 2020-11-08 ENCOUNTER — Ambulatory Visit (HOSPITAL_COMMUNITY)
Admission: RE | Admit: 2020-11-08 | Discharge: 2020-11-08 | Disposition: A | Discharge status: Home / Self Care | Payer: Medicare Other | Source: Ambulatory Visit | Attending: Neurological Surgery | Admitting: Neurological Surgery

## 2020-11-08 ENCOUNTER — Ambulatory Visit (HOSPITAL_COMMUNITY): Payer: Medicare Other | Admitting: Physician Assistant

## 2020-11-08 ENCOUNTER — Ambulatory Visit (HOSPITAL_COMMUNITY): Payer: Medicare Other | Admitting: Anesthesiology

## 2020-11-08 ENCOUNTER — Ambulatory Visit (HOSPITAL_COMMUNITY): Payer: Medicare Other

## 2020-11-08 ENCOUNTER — Encounter (HOSPITAL_COMMUNITY)
Admission: RE | Disposition: A | Discharge status: Home / Self Care | Payer: Self-pay | Source: Ambulatory Visit | Attending: Neurological Surgery

## 2020-11-08 DIAGNOSIS — M48061 Spinal stenosis, lumbar region without neurogenic claudication: Secondary | ICD-10-CM | POA: Diagnosis present

## 2020-11-08 DIAGNOSIS — Z7902 Long term (current) use of antithrombotics/antiplatelets: Secondary | ICD-10-CM | POA: Diagnosis not present

## 2020-11-08 DIAGNOSIS — Z881 Allergy status to other antibiotic agents status: Secondary | ICD-10-CM | POA: Diagnosis not present

## 2020-11-08 DIAGNOSIS — Z887 Allergy status to serum and vaccine status: Secondary | ICD-10-CM | POA: Insufficient documentation

## 2020-11-08 DIAGNOSIS — Z79899 Other long term (current) drug therapy: Secondary | ICD-10-CM | POA: Diagnosis not present

## 2020-11-08 DIAGNOSIS — Z888 Allergy status to other drugs, medicaments and biological substances status: Secondary | ICD-10-CM | POA: Insufficient documentation

## 2020-11-08 DIAGNOSIS — M5126 Other intervertebral disc displacement, lumbar region: Secondary | ICD-10-CM | POA: Diagnosis not present

## 2020-11-08 DIAGNOSIS — Z884 Allergy status to anesthetic agent status: Secondary | ICD-10-CM | POA: Insufficient documentation

## 2020-11-08 DIAGNOSIS — Z91030 Bee allergy status: Secondary | ICD-10-CM | POA: Insufficient documentation

## 2020-11-08 DIAGNOSIS — Z886 Allergy status to analgesic agent status: Secondary | ICD-10-CM | POA: Diagnosis not present

## 2020-11-08 DIAGNOSIS — Z882 Allergy status to sulfonamides status: Secondary | ICD-10-CM | POA: Diagnosis not present

## 2020-11-08 DIAGNOSIS — Z79890 Hormone replacement therapy (postmenopausal): Secondary | ICD-10-CM | POA: Diagnosis not present

## 2020-11-08 DIAGNOSIS — Z930 Tracheostomy status: Secondary | ICD-10-CM | POA: Insufficient documentation

## 2020-11-08 DIAGNOSIS — Z419 Encounter for procedure for purposes other than remedying health state, unspecified: Secondary | ICD-10-CM

## 2020-11-08 DIAGNOSIS — Z7951 Long term (current) use of inhaled steroids: Secondary | ICD-10-CM | POA: Insufficient documentation

## 2020-11-08 HISTORY — PX: LUMBAR LAMINECTOMY/DECOMPRESSION MICRODISCECTOMY: SHX5026

## 2020-11-08 SURGERY — LUMBAR LAMINECTOMY/DECOMPRESSION MICRODISCECTOMY 2 LEVELS
Anesthesia: General | Site: Back | Laterality: Bilateral

## 2020-11-08 MED ORDER — CHLORHEXIDINE GLUCONATE CLOTH 2 % EX PADS: 6.0000 | MEDICATED_PAD | Freq: Once | CUTANEOUS | Status: DC

## 2020-11-08 MED ORDER — IPRATROPIUM-ALBUTEROL 0.5-2.5 (3) MG/3ML IN SOLN: 3.0000 mL | RESPIRATORY_TRACT | Status: DC | PRN

## 2020-11-08 MED ORDER — SODIUM CHLORIDE 0.9% FLUSH: 3.0000 mL | INTRAVENOUS | Status: DC | PRN

## 2020-11-08 MED ORDER — POTASSIUM CHLORIDE IN NACL 20-0.9 MEQ/L-% IV SOLN: INTRAVENOUS | Status: DC

## 2020-11-08 MED ORDER — PHENYLEPHRINE HCL (PRESSORS) 10 MG/ML IV SOLN
INTRAVENOUS | Status: DC | PRN
  Administered 2020-11-08: 120 ug via INTRAVENOUS
  Administered 2020-11-08: 40 ug via INTRAVENOUS
  Administered 2020-11-08 (×2): 120 ug via INTRAVENOUS

## 2020-11-08 MED ORDER — DEXAMETHASONE 4 MG PO TABS
4.0000 mg | ORAL_TABLET | Freq: Four times a day (QID) | ORAL | Status: DC
  Administered 2020-11-08: 4 mg via ORAL
  Filled 2020-11-08: qty 1

## 2020-11-08 MED ORDER — SUGAMMADEX SODIUM 200 MG/2ML IV SOLN
INTRAVENOUS | Status: DC | PRN
  Administered 2020-11-08: 200 mg via INTRAVENOUS

## 2020-11-08 MED ORDER — EPHEDRINE 5 MG/ML INJ
INTRAVENOUS | Status: AC
  Filled 2020-11-08: qty 5

## 2020-11-08 MED ORDER — ONDANSETRON HCL 4 MG/2ML IJ SOLN
INTRAMUSCULAR | Status: DC | PRN
  Administered 2020-11-08: 4 mg via INTRAVENOUS

## 2020-11-08 MED ORDER — METOPROLOL SUCCINATE ER 25 MG PO TB24: 12.5000 mg | ORAL_TABLET | Freq: Every day | ORAL | Status: DC

## 2020-11-08 MED ORDER — CEFAZOLIN SODIUM-DEXTROSE 2-4 GM/100ML-% IV SOLN
2.0000 g | Freq: Three times a day (TID) | INTRAVENOUS | Status: DC
  Administered 2020-11-08: 2 g via INTRAVENOUS
  Filled 2020-11-08: qty 100

## 2020-11-08 MED ORDER — GABAPENTIN 300 MG PO CAPS
300.0000 mg | ORAL_CAPSULE | ORAL | Status: AC
  Administered 2020-11-08: 300 mg via ORAL
  Filled 2020-11-08: qty 1

## 2020-11-08 MED ORDER — PROPOFOL 10 MG/ML IV BOLUS
INTRAVENOUS | Status: AC
  Filled 2020-11-08: qty 40

## 2020-11-08 MED ORDER — FENTANYL CITRATE (PF) 250 MCG/5ML IJ SOLN
INTRAMUSCULAR | Status: AC
  Filled 2020-11-08: qty 5

## 2020-11-08 MED ORDER — PANTOPRAZOLE SODIUM 40 MG PO TBEC: 40.0000 mg | DELAYED_RELEASE_TABLET | Freq: Every day | ORAL | Status: DC

## 2020-11-08 MED ORDER — SODIUM CHLORIDE 0.9% FLUSH
3.0000 mL | Freq: Two times a day (BID) | INTRAVENOUS | Status: DC
  Administered 2020-11-08: 3 mL via INTRAVENOUS

## 2020-11-08 MED ORDER — OXYCODONE HCL 5 MG PO TABS: 5.0000 mg | ORAL_TABLET | ORAL | Status: DC | PRN

## 2020-11-08 MED ORDER — FUROSEMIDE 20 MG PO TABS: 20.0000 mg | ORAL_TABLET | Freq: Every day | ORAL | Status: DC

## 2020-11-08 MED ORDER — METHOCARBAMOL 500 MG PO TABS: 500.0000 mg | ORAL_TABLET | Freq: Four times a day (QID) | ORAL | 0 refills | Status: DC

## 2020-11-08 MED ORDER — ONDANSETRON HCL 4 MG/2ML IJ SOLN: 4.0000 mg | Freq: Four times a day (QID) | INTRAMUSCULAR | Status: DC | PRN

## 2020-11-08 MED ORDER — ONDANSETRON HCL 4 MG PO TABS: 4.0000 mg | ORAL_TABLET | Freq: Four times a day (QID) | ORAL | Status: DC | PRN

## 2020-11-08 MED ORDER — AMISULPRIDE (ANTIEMETIC) 5 MG/2ML IV SOLN
INTRAVENOUS | Status: AC
  Filled 2020-11-08: qty 4

## 2020-11-08 MED ORDER — AMISULPRIDE (ANTIEMETIC) 5 MG/2ML IV SOLN
10.0000 mg | Freq: Once | INTRAVENOUS | Status: AC | PRN
  Administered 2020-11-08: 10 mg via INTRAVENOUS

## 2020-11-08 MED ORDER — LIDOCAINE 2% (20 MG/ML) 5 ML SYRINGE
INTRAMUSCULAR | Status: AC
  Filled 2020-11-08: qty 5

## 2020-11-08 MED ORDER — LACTATED RINGERS IV SOLN: INTRAVENOUS | Status: DC

## 2020-11-08 MED ORDER — THROMBIN 5000 UNITS EX SOLR
CUTANEOUS | Status: AC
  Filled 2020-11-08: qty 5000

## 2020-11-08 MED ORDER — ORAL CARE MOUTH RINSE
15.0000 mL | Freq: Once | OROMUCOSAL | Status: AC
  Administered 2020-11-08: 15 mL via OROMUCOSAL

## 2020-11-08 MED ORDER — BUPIVACAINE HCL (PF) 0.25 % IJ SOLN
INTRAMUSCULAR | Status: DC | PRN
  Administered 2020-11-08: 5 mL

## 2020-11-08 MED ORDER — HYDROCODONE-ACETAMINOPHEN 5-325 MG PO TABS: 1.0000 | ORAL_TABLET | ORAL | 0 refills | Status: DC | PRN

## 2020-11-08 MED ORDER — ROCURONIUM BROMIDE 10 MG/ML (PF) SYRINGE
PREFILLED_SYRINGE | INTRAVENOUS | Status: AC
  Filled 2020-11-08: qty 10

## 2020-11-08 MED ORDER — LIDOCAINE 2% (20 MG/ML) 5 ML SYRINGE
INTRAMUSCULAR | Status: DC | PRN
  Administered 2020-11-08: 60 mg via INTRAVENOUS
  Administered 2020-11-08: 40 mg via INTRAVENOUS

## 2020-11-08 MED ORDER — PROPOFOL 10 MG/ML IV BOLUS
INTRAVENOUS | Status: DC | PRN
  Administered 2020-11-08: 50 mg via INTRAVENOUS
  Administered 2020-11-08: 30 mg via INTRAVENOUS
  Administered 2020-11-08 (×2): 20 mg via INTRAVENOUS

## 2020-11-08 MED ORDER — HYDROXYZINE HCL 50 MG/ML IM SOLN
50.0000 mg | Freq: Four times a day (QID) | INTRAMUSCULAR | Status: DC | PRN
  Administered 2020-11-08: 50 mg via INTRAMUSCULAR
  Filled 2020-11-08: qty 1

## 2020-11-08 MED ORDER — FENTANYL CITRATE (PF) 100 MCG/2ML IJ SOLN: 25.0000 ug | INTRAMUSCULAR | Status: DC | PRN

## 2020-11-08 MED ORDER — FENTANYL CITRATE (PF) 100 MCG/2ML IJ SOLN
INTRAMUSCULAR | Status: DC | PRN
  Administered 2020-11-08: 75 ug via INTRAVENOUS
  Administered 2020-11-08 (×2): 50 ug via INTRAVENOUS
  Administered 2020-11-08: 25 ug via INTRAVENOUS
  Administered 2020-11-08: 50 ug via INTRAVENOUS

## 2020-11-08 MED ORDER — ESTRADIOL 1 MG PO TABS
1.0000 mg | ORAL_TABLET | Freq: Every day | ORAL | Status: DC
  Filled 2020-11-08: qty 1

## 2020-11-08 MED ORDER — MONTELUKAST SODIUM 10 MG PO TABS: 10.0000 mg | ORAL_TABLET | Freq: Every day | ORAL | Status: DC

## 2020-11-08 MED ORDER — SENNA 8.6 MG PO TABS: 1.0000 | ORAL_TABLET | Freq: Two times a day (BID) | ORAL | Status: DC

## 2020-11-08 MED ORDER — MORPHINE SULFATE (PF) 2 MG/ML IV SOLN: 2.0000 mg | INTRAVENOUS | Status: DC | PRN

## 2020-11-08 MED ORDER — BUPIVACAINE HCL (PF) 0.25 % IJ SOLN
INTRAMUSCULAR | Status: AC
  Filled 2020-11-08: qty 30

## 2020-11-08 MED ORDER — ONDANSETRON HCL 4 MG/2ML IJ SOLN
4.0000 mg | Freq: Once | INTRAMUSCULAR | Status: AC | PRN
  Administered 2020-11-08: 4 mg via INTRAVENOUS

## 2020-11-08 MED ORDER — GABAPENTIN 300 MG PO CAPS: 300.0000 mg | ORAL_CAPSULE | Freq: Three times a day (TID) | ORAL | Status: DC | PRN

## 2020-11-08 MED ORDER — EPHEDRINE SULFATE 50 MG/ML IJ SOLN
INTRAMUSCULAR | Status: DC | PRN
  Administered 2020-11-08: 5 mg via INTRAVENOUS
  Administered 2020-11-08 (×2): 10 mg via INTRAVENOUS

## 2020-11-08 MED ORDER — BUDESONIDE 0.25 MG/2ML IN SUSP: 0.2500 mg | Freq: Two times a day (BID) | RESPIRATORY_TRACT | Status: DC

## 2020-11-08 MED ORDER — THROMBIN 5000 UNITS EX SOLR
CUTANEOUS | Status: AC
  Filled 2020-11-08: qty 10000

## 2020-11-08 MED ORDER — CEFAZOLIN SODIUM-DEXTROSE 2-4 GM/100ML-% IV SOLN
2.0000 g | INTRAVENOUS | Status: AC
  Administered 2020-11-08: 2 g via INTRAVENOUS
  Filled 2020-11-08: qty 100

## 2020-11-08 MED ORDER — BUDESONIDE 0.25 MG/2ML IN SUSP
0.2500 mg | Freq: Two times a day (BID) | RESPIRATORY_TRACT | Status: DC
  Filled 2020-11-08: qty 2

## 2020-11-08 MED ORDER — ACETAMINOPHEN 500 MG PO TABS
1000.0000 mg | ORAL_TABLET | ORAL | Status: AC
  Administered 2020-11-08: 1000 mg via ORAL
  Filled 2020-11-08: qty 2

## 2020-11-08 MED ORDER — DEXAMETHASONE SODIUM PHOSPHATE 4 MG/ML IJ SOLN
4.0000 mg | Freq: Four times a day (QID) | INTRAMUSCULAR | Status: DC
  Administered 2020-11-08: 4 mg via INTRAVENOUS
  Filled 2020-11-08: qty 1

## 2020-11-08 MED ORDER — ONDANSETRON HCL 4 MG/2ML IJ SOLN
INTRAMUSCULAR | Status: AC
  Filled 2020-11-08: qty 2

## 2020-11-08 MED ORDER — ALBUTEROL SULFATE (2.5 MG/3ML) 0.083% IN NEBU: 2.5000 mg | INHALATION_SOLUTION | Freq: Four times a day (QID) | RESPIRATORY_TRACT | Status: DC | PRN

## 2020-11-08 MED ORDER — THROMBIN 5000 UNITS EX SOLR
OROMUCOSAL | Status: DC | PRN
  Administered 2020-11-08: 5 mL via TOPICAL

## 2020-11-08 MED ORDER — DEXAMETHASONE SODIUM PHOSPHATE 10 MG/ML IJ SOLN
10.0000 mg | Freq: Once | INTRAMUSCULAR | Status: AC
  Administered 2020-11-08: 10 mg via INTRAVENOUS
  Filled 2020-11-08: qty 1

## 2020-11-08 MED ORDER — 0.9 % SODIUM CHLORIDE (POUR BTL) OPTIME
TOPICAL | Status: DC | PRN
  Administered 2020-11-08: 1000 mL

## 2020-11-08 MED ORDER — SODIUM CHLORIDE 0.9 % IV SOLN: 250.0000 mL | INTRAVENOUS | Status: DC

## 2020-11-08 MED ORDER — ACETAMINOPHEN 500 MG PO TABS
1000.0000 mg | ORAL_TABLET | Freq: Four times a day (QID) | ORAL | Status: DC
Start: 2020-11-08 — End: 2020-11-09
  Administered 2020-11-08: 1000 mg via ORAL
  Filled 2020-11-08: qty 2

## 2020-11-08 MED ORDER — ROCURONIUM BROMIDE 100 MG/10ML IV SOLN
INTRAVENOUS | Status: DC | PRN
  Administered 2020-11-08: 10 mg via INTRAVENOUS
  Administered 2020-11-08: 30 mg via INTRAVENOUS

## 2020-11-08 MED ORDER — PHENYLEPHRINE 40 MCG/ML (10ML) SYRINGE FOR IV PUSH (FOR BLOOD PRESSURE SUPPORT)
PREFILLED_SYRINGE | INTRAVENOUS | Status: AC
  Filled 2020-11-08: qty 10

## 2020-11-08 MED ORDER — SUGAMMADEX SODIUM 500 MG/5ML IV SOLN
INTRAVENOUS | Status: AC
  Filled 2020-11-08: qty 5

## 2020-11-08 SURGICAL SUPPLY — 42 items
BAG COUNTER SPONGE SURGICOUNT (BAG) ×2 IMPLANT
BAND RUBBER #18 3X1/16 STRL (MISCELLANEOUS) ×4 IMPLANT
BENZOIN TINCTURE PRP APPL 2/3 (GAUZE/BANDAGES/DRESSINGS) ×2 IMPLANT
BUR CARBIDE MATCH 3.0 (BURR) ×2 IMPLANT
CANISTER SUCT 3000ML PPV (MISCELLANEOUS) ×2 IMPLANT
DERMABOND ADVANCED (GAUZE/BANDAGES/DRESSINGS) ×1
DERMABOND ADVANCED .7 DNX12 (GAUZE/BANDAGES/DRESSINGS) IMPLANT
DRAPE LAPAROTOMY 100X72X124 (DRAPES) ×2 IMPLANT
DRAPE MICROSCOPE LEICA (MISCELLANEOUS) ×2 IMPLANT
DRAPE SURG 17X23 STRL (DRAPES) ×2 IMPLANT
DURAPREP 26ML APPLICATOR (WOUND CARE) ×2 IMPLANT
ELECT REM PT RETURN 9FT ADLT (ELECTROSURGICAL) ×2
ELECTRODE REM PT RTRN 9FT ADLT (ELECTROSURGICAL) ×1 IMPLANT
GAUZE 4X4 16PLY ~~LOC~~+RFID DBL (SPONGE) ×1 IMPLANT
GLOVE SURG ENC MOIS LTX SZ7 (GLOVE) ×1 IMPLANT
GLOVE SURG ENC MOIS LTX SZ8 (GLOVE) ×2 IMPLANT
GLOVE SURG UNDER POLY LF SZ7 (GLOVE) ×1 IMPLANT
GOWN STRL REUS W/ TWL LRG LVL3 (GOWN DISPOSABLE) IMPLANT
GOWN STRL REUS W/ TWL XL LVL3 (GOWN DISPOSABLE) ×1 IMPLANT
GOWN STRL REUS W/TWL 2XL LVL3 (GOWN DISPOSABLE) IMPLANT
GOWN STRL REUS W/TWL LRG LVL3 (GOWN DISPOSABLE) ×6
GOWN STRL REUS W/TWL XL LVL3 (GOWN DISPOSABLE) ×2
HEMOSTAT POWDER KIT SURGIFOAM (HEMOSTASIS) ×2 IMPLANT
KIT BASIN OR (CUSTOM PROCEDURE TRAY) ×2 IMPLANT
KIT TURNOVER KIT B (KITS) ×2 IMPLANT
NDL HYPO 25X1 1.5 SAFETY (NEEDLE) ×1 IMPLANT
NDL SPNL 20GX3.5 QUINCKE YW (NEEDLE) IMPLANT
NEEDLE HYPO 25X1 1.5 SAFETY (NEEDLE) ×2 IMPLANT
NEEDLE SPNL 20GX3.5 QUINCKE YW (NEEDLE) IMPLANT
NS IRRIG 1000ML POUR BTL (IV SOLUTION) ×2 IMPLANT
PACK LAMINECTOMY NEURO (CUSTOM PROCEDURE TRAY) ×2 IMPLANT
PAD ARMBOARD 7.5X6 YLW CONV (MISCELLANEOUS) ×5 IMPLANT
SPONGE T-LAP 4X18 ~~LOC~~+RFID (SPONGE) ×2 IMPLANT
STRIP CLOSURE SKIN 1/2X4 (GAUZE/BANDAGES/DRESSINGS) ×2 IMPLANT
SUT VIC AB 0 CT1 18XCR BRD8 (SUTURE) ×1 IMPLANT
SUT VIC AB 0 CT1 8-18 (SUTURE) ×2
SUT VIC AB 2-0 CP2 18 (SUTURE) ×2 IMPLANT
SUT VIC AB 3-0 SH 8-18 (SUTURE) ×2 IMPLANT
TOWEL GREEN STERILE (TOWEL DISPOSABLE) ×2 IMPLANT
TOWEL GREEN STERILE FF (TOWEL DISPOSABLE) ×2 IMPLANT
TUBE TRACH FLEX 4.0 CUFF (MISCELLANEOUS) IMPLANT
WATER STERILE IRR 1000ML POUR (IV SOLUTION) ×2 IMPLANT

## 2020-11-08 NOTE — Progress Notes (Signed)
Patient was transported via wheelchair by RN for discharge home; in no acute distress nor complaints of pain nor discomfort; room was checked and accounted for all her belongings; discharge instructions given to patient by RN and she verbalized understanding on the instructions given.

## 2020-11-08 NOTE — Anesthesia Postprocedure Evaluation (Signed)
Anesthesia Post Note  Patient: Anne [name]  Procedure(s) Performed: Laminectomy and Foraminotomy - bilateral - Lumbar two-Lumbar three - Lumbar three-Lumbar four (Bilateral: Back)     Patient location during evaluation: PACU Anesthesia Type: General Level of consciousness: awake Pain management: pain level controlled Vital Signs Assessment: post-procedure vital signs reviewed and stable Respiratory status: spontaneous breathing, nonlabored ventilation and respiratory function stable Cardiovascular status: blood pressure returned to baseline and stable Postop Assessment: no apparent nausea or vomiting Anesthetic complications: no   No notable events documented.  Last Vitals:  Vitals:   11/08/20 1210 11/08/20 1603  BP: (!) 153/75 (!) 110/53  Pulse: 77 67  Resp: 18 16  Temp:  36.4 C  SpO2: 100% 97%    Last Pain:  Vitals:   11/08/20 1210  TempSrc:   PainSc: 0-No pain                 Adian Jablonowski P Calisa Luckenbaugh

## 2020-11-08 NOTE — Transfer of Care (Signed)
Immediate Anesthesia Transfer of Care Note  Patient: Anne [name]  Procedure(s) Performed: Laminectomy and Foraminotomy - bilateral - Lumbar two-Lumbar three - Lumbar three-Lumbar four (Bilateral: Back)  Patient Location: PACU  Anesthesia Type:General  Level of Consciousness: oriented, drowsy, patient cooperative and responds to stimulation  Airway & Oxygen Therapy: Patient Spontanous Breathing and Patient connected to tracheostomy mask oxygen  Post-op Assessment: Report given to RN, Post -op Vital signs reviewed and stable and Patient moving all extremities X 4  Post vital signs: Reviewed and stable  Last Vitals:  Vitals Value Taken Time  BP 119/53 11/08/20 1047  Temp    Pulse 68 11/08/20 1048  Resp 16 11/08/20 1048  SpO2 100 % 11/08/20 1048  Vitals shown include unvalidated device data.  Last Pain:  Vitals:   11/08/20 0613  TempSrc:   PainSc: 7       Patients Stated Pain Goal: 2 ([id number])  Complications: No notable events documented.

## 2020-11-08 NOTE — Discharge Summary (Signed)
Physician Discharge Summary  Patient ID: Anne [name] MRN: [id number] DOB/AGE: Mar 16, 1951 70 y.o.  Admit date: 11/08/2020 Discharge date: 11/08/2020  Admission Diagnoses: Lumbar spinal stenosis L2-3 L3-4, lumbar disc herniation L3-4 right, back and leg pain    Discharge Diagnoses: same   Discharged Condition: good  Hospital Course: The patient was admitted on 11/08/2020 and taken to the operating room where the patient underwent lumbar lami right L2-3, L3-4. The patient tolerated the procedure well and was taken to the recovery room and then to the floor in stable condition. The hospital course was routine. There were no complications. The wound remained clean dry and intact. Pt had appropriate back soreness. No complaints of leg pain or new N/T/W. The patient remained afebrile with stable vital signs, and tolerated a regular diet. The patient continued to increase activities, and pain was well controlled with oral pain medications.   Consults: None  Significant Diagnostic Studies:  Results for orders placed or performed in visit on 11/05/20  SARS Coronavirus 2 (TAT 6-24 hrs)  Result Value Ref Range   SARS Coronavirus 2 RESULT: NEGATIVE    *Note: Due to a large number of results and/or encounters for the requested time period, some results have not been displayed. A complete set of results can be found in Results Review.    Chest 2 View  Result Date: 10/28/2020 CLINICAL DATA:  Preoperative chest for back surgery. EXAM: CHEST - 2 VIEW COMPARISON:  03/13/2018.  01/10/2017. FINDINGS: Tracheostomy tube in stable position. Biapical pleural-parenchymal thickening consistent scarring again noted. No acute infiltrate. Heart size normal. Surgical clips upper abdomen. Thoracic spine scoliosis. Prior cervical spine fusion. No acute bony abnormality. IMPRESSION: 1. Tracheostomy tube in stable position. Biapical pleural-parenchymal thickening consistent scarring again noted. 2.  No acute abnormality  identified. Electronically Signed   By: Marcello Moores  Register   On: 10/28/2020 04:23   DG Lumbar Spine 1 View  Result Date: 11/08/2020 CLINICAL DATA:  L2-3 and L3-4 laminectomy and fusion. EXAM: LUMBAR SPINE - 1 VIEW COMPARISON:  MRI of September 12, 2019. FINDINGS: Single intraoperative cross-table lateral projection was obtained of the lumbar spine. This demonstrates surgical probe at the L3-4 level. IMPRESSION: Surgical localization as described above. Electronically Signed   By: Marijo Conception M.D.   On: 11/08/2020 14:27    Antibiotics:  Anti-infectives (From admission, onward)    Start     Dose/Rate Route Frequency Ordered Stop   11/08/20 1600  ceFAZolin (ANCEF) IVPB 2g/100 mL premix        2 g 200 mL/hr over 30 Minutes Intravenous Every 8 hours 11/08/20 1213 11/09/20 0759   11/08/20 0600  ceFAZolin (ANCEF) IVPB 2g/100 mL premix        2 g 200 mL/hr over 30 Minutes Intravenous On call to O.R. 11/08/20 0547 11/08/20 0834       Discharge Exam: Blood pressure (!) 110/53, pulse 67, temperature 97.6 F (36.4 C), resp. rate 16, height '5\' 3"'$  (1.6 m), weight 46.7 kg, SpO2 97 %. Neurologic: Grossly normal Ambulating dressing cdi   Discharge Medications:   Allergies as of 11/08/2020       Reactions   Asa [aspirin] Shortness Of Breath, Swelling, Other (See Comments)   Tongue swells   Bee Venom Anaphylaxis, Shortness Of Breath   Epinephrine Shortness Of Breath, Palpitations   Occurs when pt is given double the dose, pt would use epipen as needed    Feraheme [ferumoxytol] Shortness Of Breath   Light headed  Influenza Vaccines Shortness Of Breath, Other (See Comments)   Can take flu vaccines without eggs    Pfizer-biontech Covid-19 Vacc [covid-19 Mrna Vacc (moderna)] Anaphylaxis   Quinolones Swelling, Other (See Comments)   Feet swell   Shellfish Allergy Anaphylaxis   Ciprofloxacin Swelling, Other (See Comments)   ALL MEDS ENDING IN -FLOXACIN MAKE THE FEET SWELL   Clarithromycin Nausea  Only   Abdomina pain   Erythromycin Nausea Only   Abdominal pain   Levaquin [levofloxacin In D5w] Swelling, Other (See Comments)   Feet and legs ache and SWELL   Peanut-containing Drug Products Other (See Comments)   Wheezing (boiled or raw peanuts)   Septra [sulfamethoxazole-trimethoprim] Diarrhea   Telithromycin Nausea Only   Abdominal pain   Zanaflex [tizanidine] Other (See Comments)   Caused the patient to feel "spaced out" and "not well"   Adhesive [tape] Rash, Other (See Comments)   Paper works tape works fine         Medication List     TAKE these medications    acetaminophen 500 MG tablet Commonly known as: TYLENOL Take 1,000 mg by mouth 2 (two) times daily as needed for moderate pain or headache.   albuterol 108 (90 Base) MCG/ACT inhaler Commonly known as: Ventolin HFA Inhale 2 puffs into the lungs every 6 (six) hours as needed for wheezing or shortness of breath.   azelastine 0.1 % nasal spray Commonly known as: ASTELIN Place 1 spray into both nostrils 2 (two) times daily. Use in each nostril as directed What changed:  when to take this reasons to take this   budesonide 0.25 MG/2ML nebulizer solution Commonly known as: Pulmicort Take 2 mLs (0.25 mg total) by nebulization 2 (two) times daily. Use after DuoNeb   cetirizine 10 MG tablet Commonly known as: ZYRTEC TAKE 1 TABLET BY MOUTH EVERY DAY (NOT COVERED What changed: See the new instructions.   clopidogrel 75 MG tablet Commonly known as: PLAVIX TAKE 1 TABLET BY MOUTH EVERY DAY What changed: when to take this   diphenhydrAMINE 25 MG tablet Commonly known as: BENADRYL Take 25 mg by mouth daily as needed for allergies.   doxycycline 100 MG tablet Commonly known as: VIBRA-TABS Take 1 tablet (100 mg total) by mouth 2 (two) times daily.   EPINEPHrine 0.3 mg/0.3 mL Soaj injection Commonly known as: EPI-PEN Inject 0.3 mg into the muscle as needed for anaphylaxis.   estradiol 1 MG tablet Commonly  known as: ESTRACE TAKE 1 TABLET BY MOUTH EVERY DAY   fluticasone 50 MCG/ACT nasal spray Commonly known as: FLONASE Place into both nostrils daily as needed for allergies or rhinitis.   furosemide 20 MG tablet Commonly known as: LASIX TAKE 1 TABLET BY MOUTH EVERY DAY AS NEEDED What changed:  how much to take how to take this when to take this additional instructions   gabapentin 300 MG capsule Commonly known as: NEURONTIN Take 300 mg by mouth 3 (three) times daily as needed (pain).   guaiFENesin-codeine 100-10 MG/5ML syrup Take 5 mLs by mouth 2 (two) times daily as needed for cough.   HYDROcodone-acetaminophen 5-325 MG tablet Commonly known as: NORCO/VICODIN Take 1 tablet by mouth 2 (two) times daily as needed for severe pain. What changed: Another medication with the same name was added. Make sure you understand how and when to take each.   HYDROcodone-acetaminophen 5-325 MG tablet Commonly known as: NORCO/VICODIN Take 1 tablet by mouth every 4 (four) hours as needed for moderate pain. What changed: You  were already taking a medication with the same name, and this prescription was added. Make sure you understand how and when to take each.   HYDROcodone-homatropine 5-1.5 MG/5ML syrup Commonly known as: HYCODAN Take 5 mLs by mouth every 8 (eight) hours as needed for cough.   ibuprofen 200 MG tablet Commonly known as: ADVIL Take 400-800 mg by mouth daily as needed for headache or moderate pain.   ipratropium-albuterol 0.5-2.5 (3) MG/3ML Soln Commonly known as: DuoNeb Take 3 mLs by nebulization every 4 (four) hours as needed. Dx 496 What changed: reasons to take this   ketoconazole 2 % shampoo Commonly known as: NIZORAL Apply 1 application topically 2 (two) times a week.   Lubricant Eye Oint Place 1 application into both eyes 2 (two) times daily as needed (for dry eyes).   methocarbamol 500 MG tablet Commonly known as: Robaxin Take 1 tablet (500 mg total) by mouth 4  (four) times daily.   methylPREDNISolone 4 MG Tbpk tablet Commonly known as: MEDROL DOSEPAK Take as directed in the package, this is a taper pack   metoprolol succinate 25 MG 24 hr tablet Commonly known as: TOPROL-XL TAKE 1/2 TABLET BY MOUTH EVERY DAY   montelukast 10 MG tablet Commonly known as: SINGULAIR TAKE 1 TABLET BY MOUTH EVERYDAY AT BEDTIME What changed: See the new instructions.   mupirocin ointment 2 % Commonly known as: BACTROBAN Apply 1 application topically daily as needed (FOR [facility] SITE IRRITATION).   Naphazoline-Pheniramine 0.027-0.315 % Soln Place 1 drop into both eyes daily as needed (allergies).   pantoprazole 40 MG tablet Commonly known as: PROTONIX TAKE ONE TABLET EVERY DAY NEEDS FOR OFFICE USE VISIT FOR MORE REFILLS. What changed: See the new instructions.   potassium chloride 10 MEQ tablet Commonly known as: KLOR-CON Take 10 mEq by mouth daily as needed (low potassium).   potassium chloride 20 MEQ/15ML (10%) Soln Take 30 mLs (40 mEq total) by mouth daily for 3 days.   rizatriptan 10 MG disintegrating tablet Commonly known as: MAXALT-MLT TAKE 1 TABLET (10 MG TOTAL) BY MOUTH DAILY AS NEEDED FOR MIGRAINE. MAY REPEAT FOR ONE DOSE 2 HOURS AFTER THE FIRST ONE IF NEEDED.   sodium chloride 0.65 % Soln nasal spray Commonly known as: OCEAN Place 1 spray into both nostrils every 4 (four) hours as needed for congestion.        Disposition: home   Final Dx: right lumbar lami L2-3, L3-4  Discharge Instructions      Remove dressing in 72 hours   Complete by: As directed    Call MD for:  difficulty breathing, headache or visual disturbances   Complete by: As directed    Call MD for:  hives   Complete by: As directed    Call MD for:  persistant nausea and vomiting   Complete by: As directed    Call MD for:  redness, tenderness, or signs of infection (pain, swelling, redness, odor or green/yellow discharge around incision site)   Complete by: As  directed    Call MD for:  severe uncontrolled pain   Complete by: As directed    Call MD for:  temperature >100.4   Complete by: As directed    Diet - low sodium heart healthy   Complete by: As directed    Driving Restrictions   Complete by: As directed    No driving for 2 weeks, no riding in the car for 1 week   Increase activity slowly   Complete by: As  directed    Lifting restrictions   Complete by: As directed    No lifting more than 8 lbs          Signed: Ocie Cornfield Anne [name] 11/08/2020, 6:29 PM

## 2020-11-08 NOTE — Op Note (Signed)
11/08/2020  10:32 AM  PATIENT:  Anne [name]  70 y.o. female  PRE-OPERATIVE DIAGNOSIS: Lumbar spinal stenosis L2-3 L3-4, lumbar disc herniation L3-4 right, back and leg pain  POST-OPERATIVE DIAGNOSIS:  same  PROCEDURE: Decompressive lumbar hemilaminectomy medial facetectomy foraminotomies L2-3 and L3-4 bilaterally with discectomy L3-4 on the right  SURGEON:  Sherley Bounds, MD  ASSISTANTS: Glenford Peers FNP  ANESTHESIA:   General  EBL: 50 ml  Total I/O In: 900 [I.V.:900] Out: 50 [Blood:50]  BLOOD ADMINISTERED: none  DRAINS: None  SPECIMEN:  none  INDICATION FOR PROCEDURE: This patient presented with back and leg pain. Imaging showed lumbar stenosis at L2-L3 for the right-sided disc herniation L3-4. The patient tried conservative measures without relief. Pain was debilitating. Recommended decompressive laminectomy with discectomy. Patient understood the risks, benefits, and alternatives and potential outcomes and wished to proceed.  PROCEDURE DETAILS: The patient was taken to the operating room and after induction of adequate generalized endotracheal anesthesia, the patient was rolled into the prone position on the Wilson frame and all pressure points were padded. The lumbar region was cleaned and then prepped with DuraPrep and draped in the usual sterile fashion. 5 cc of local anesthesia was injected and then a dorsal midline incision was made and carried down to the lumbo sacral fascia. The fascia was opened and the paraspinous musculature was taken down in a subperiosteal fashion to expose L2-3 and L3-4 bilaterally. Intraoperative x-ray confirmed my level, and then I used a combination of the high-speed drill and the Kerrison punches to perform a hemilaminectomy, medial facetectomy, and foraminotomy at L2-3 and L3-4 bilaterally. The underlying yellow ligament was opened and removed in a piecemeal fashion to expose the underlying dura and exiting nerve root at each level  bilaterally. I undercut the lateral recess and dissected down until I was medial to and distal to the pedicle. The nerve root was well decompressed.  At L3-4 on the right I retracted the dura medially and found a fairly significant subannular disc herniation extending from the lateral recess into the foramen.  We then gently retracted the nerve root medially with a retractor, coagulated the epidural venous vasculature, and incised the disc space. I performed a thorough intradiscal discectomy with pituitary rongeurs, until I had a nice decompression of the nerve root and the midline. I then palpated with a coronary dilator along the nerve root and into the foramen to assure adequate decompression. I felt no more compression of the nerve root. I irrigated with saline solution containing bacitracin. Achieved hemostasis with bipolar cautery and Surgifoam,  and then closed the fascia with 0 Vicryl. I closed the subcutaneous tissues with 2-0 Vicryl and the subcuticular tissues with 3-0 Vicryl. The skin was then closed with Dermabond. The drapes were removed, a sterile dressing was applied.  My nurse practitioner was involved in the exposure, safe retraction of the neural elements, the disc work and the closure. the patient was awakened from general anesthesia and transferred to the recovery room in stable condition. At the end of the procedure all sponge, needle and instrument counts were correct.    PLAN OF CARE: Admit for overnight observation  PATIENT DISPOSITION:  PACU - hemodynamically stable.   Delay start of Pharmacological VTE agent (>24hrs) due to surgical blood loss or risk of bleeding:  yes

## 2020-11-08 NOTE — H&P (Signed)
Subjective: Patient is a 70 y.o. female admitted for lumbar stenosis. Onset of symptoms was several months ago, gradually worsening since that time.  The pain is rated severe, and is located at the across the lower back and radiates to legs. The pain is described as aching and occurs all day. The symptoms have been progressive. Symptoms are exacerbated by exercise and standing. MRI or CT showed lumbar stenosis   Past Medical History:  Diagnosis Date   Allergic rhinitis    Allergy    multiple, mostly aspirin, levaquin and shellfish.   Anemia    Asthma    Cataract 2014   exraction with len implant   Complication of anesthesia    Breathing problems upon waking up. 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   Compressed cervical disc    COPD (chronic obstructive pulmonary disease) (HCC)    CVA (cerebral infarction)    Dyspnea    Esophageal dysmotility    Heart murmur    as child   History of hiatal hernia    IBS (irritable bowel syndrome)    Kidney lesion 2018   Left   Migraine    PICC (peripherally inserted central catheter) removal 02/20/2017   PONV (postoperative nausea and vomiting)    Problems with swallowing    intermittently   Pulmonary fibrosis ([place])    Shingles    Stroke ([place])    slurred speech, drawn face, imaging normal, occurred twice, [facility]-"TIA" if antything" 02/20/17-no residual effects   Tracheostomy in place [facility])    Vocal cord paresis     Past Surgical History:  Procedure Laterality Date   ABDOMINAL HYSTERECTOMY     APPLICATION OF A-CELL OF HEAD/NECK N/A 02/21/2017   Procedure: APPLICATION OF A-CELL OF HEAD/NECK;  Surgeon: Wallace Going, DO;  Location: [facility];  Service: Plastics;  Laterality: N/A;   BACK SURGERY     BOTOX INJECTION N/A 07/29/2013   Procedure: BOTOX INJECTION;  Surgeon: Jerene Bears, MD;  [facility];  Service: Gastroenterology;  Laterality: N/A;   BOTOX INJECTION N/A 05/04/2015   Procedure: BOTOX  INJECTION;  Surgeon: Jerene Bears, MD;  [facility];  Service: Gastroenterology;  Laterality: N/A;   BOTOX INJECTION N/A 02/18/2018   Procedure: BOTOX INJECTION;  Surgeon: Jerene Bears, MD;  [facility];  Service: Gastroenterology;  Laterality: N/A;   BOTOX INJECTION N/A 06/30/2019   Procedure: BOTOX INJECTION;  Surgeon: Jerene Bears, MD;  [facility];  Service: Gastroenterology;  Laterality: N/A;   BOTOX INJECTION N/A 08/05/2020   Procedure: BOTOX INJECTION;  Surgeon: Jerene Bears, MD;  [facility];  Service: Gastroenterology;  Laterality: N/A;   BREAST BIOPSY Left    neg   BREAST SURGERY Left 2002   bx of skin   CHOLECYSTECTOMY     COLONOSCOPY     DIAGNOSTIC LAPAROSCOPY     ESOPHAGEAL MANOMETRY N/A 12/16/2012   Procedure: ESOPHAGEAL MANOMETRY (EM);  Surgeon: Jerene Bears, MD;  [facility];  Service: Gastroenterology;  Laterality: N/A;   ESOPHAGOGASTRODUODENOSCOPY (EGD) WITH PROPOFOL N/A 07/29/2013   Procedure: ESOPHAGOGASTRODUODENOSCOPY (EGD) WITH PROPOFOL;  Surgeon: Jerene Bears, MD;  [facility];  Service: Gastroenterology;  Laterality: N/A;  with botox injection   ESOPHAGOGASTRODUODENOSCOPY (EGD) WITH PROPOFOL N/A 05/04/2015   Procedure: ESOPHAGOGASTRODUODENOSCOPY (EGD) WITH PROPOFOL;  Surgeon: Jerene Bears, MD;  [facility];  Service: Gastroenterology;  Laterality: N/A;   ESOPHAGOGASTRODUODENOSCOPY (EGD) WITH PROPOFOL N/A 02/18/2018  Procedure: ESOPHAGOGASTRODUODENOSCOPY (EGD) WITH PROPOFOL;  Surgeon: Jerene Bears, MD;  [facility];  Service: Gastroenterology;  Laterality: N/A;   ESOPHAGOGASTRODUODENOSCOPY (EGD) WITH PROPOFOL N/A 06/30/2019   Procedure: ESOPHAGOGASTRODUODENOSCOPY (EGD) WITH PROPOFOL;  Surgeon: Jerene Bears, MD;  [facility];  Service: Gastroenterology;  Laterality: N/A;   ESOPHAGOGASTRODUODENOSCOPY (EGD) WITH PROPOFOL N/A 08/05/2020   Procedure:  ESOPHAGOGASTRODUODENOSCOPY (EGD) WITH PROPOFOL;  Surgeon: Jerene Bears, MD;  [facility];  Service: Gastroenterology;  Laterality: N/A;   EYE SURGERY     Catarct surgery 2014   Eye Surgery AS Child Left    INCISION AND DRAINAGE OF WOUND N/A 02/21/2017   Procedure: IRRIGATION AND DEBRIDEMENT WOUND NECK;  Surgeon: Wallace Going, DO;  Location: [place];  Service: Plastics;  Laterality: N/A;   JEJUNOSTOMY FEEDING TUBE     x2 both failed. no longer has   LUMBAR LAMINECTOMY/DECOMPRESSION MICRODISCECTOMY Bilateral 05/13/2018   Procedure: Laminectomy and Foraminotomy - Lumbar four-Lumbar five- bilateral;  Surgeon: Eustace Moore, MD;  Location: [place];  Service: Neurosurgery;  Laterality: Bilateral;   MULTIPLE TOOTH EXTRACTIONS     2 teeth removed   OOPHORECTOMY     POSTERIOR CERVICAL FUSION/FORAMINOTOMY N/A 01/10/2017   Procedure: LAMINECTOMY AND FORAMINOTOMY CERVICAL FOUR-CERVICAL FIVE, CERVICAL FIVE-SIX POSTERIOR CERVICAL INSTRUMENT FUSION CERVICAL THREE-CERVICAL SEVEN,CERVICAL LAMINECTOMY CERVICAL THREE-CERVICAL SEVEN.;  Surgeon: Eustace Moore, MD;  Location: [facility];  Service: Neurosurgery;  Laterality: N/A;  posterior   TONSILLECTOMY     TRACHEOSTOMY  1996   done at [facility], Dr. Kathyrn Sheriff   TUBAL LIGATION     VIDEO BRONCHOSCOPY Bilateral 11/20/2012   Procedure: VIDEO BRONCHOSCOPY WITH FLUORO;  Surgeon: Juanito Doom, MD;  [facility];  Service: Cardiopulmonary;  Laterality: Bilateral;    Prior to Admission medications   Medication Sig Start Date End Date Taking? Authorizing Provider  acetaminophen (TYLENOL) 500 MG tablet Take 1,000 mg by mouth 2 (two) times daily as needed for moderate pain or headache.   Yes [provider]  albuterol (VENTOLIN HFA) 108 (90 Base) MCG/ACT inhaler Inhale 2 puffs into the lungs every 6 (six) hours as needed for wheezing or shortness of breath. 04/06/20  Yes Tyler Pita, MD  azelastine (ASTELIN) 0.1 % nasal spray Place 1  spray into both nostrils 2 (two) times daily. Use in each nostril as directed Patient taking differently: Place 1 spray into both nostrils 2 (two) times daily as needed for rhinitis or allergies. Use in each nostril as directed 12/04/19  Yes Chesley Mires, MD  cetirizine (ZYRTEC) 10 MG tablet TAKE 1 TABLET BY MOUTH EVERY DAY (NOT COVERED Patient taking differently: Take 10 mg by mouth every evening. 08/30/20  Yes [name], MD  clopidogrel (PLAVIX) 75 MG tablet TAKE 1 TABLET BY MOUTH EVERY DAY Patient taking differently: Take 75 mg by mouth in the morning. 10/13/20  Yes [name], MD  diphenhydrAMINE (BENADRYL) 25 MG tablet Take 25 mg by mouth daily as needed for allergies.    Yes [provider]  EPINEPHrine 0.3 mg/0.3 mL IJ SOAJ injection Inject 0.3 mg into the muscle as needed for anaphylaxis.    Yes [provider]  estradiol (ESTRACE) 1 MG tablet TAKE 1 TABLET BY MOUTH EVERY DAY 11/05/20  Yes [name], MD  fluticasone [facility]) 50 MCG/ACT nasal spray Place into both nostrils daily as needed for allergies or rhinitis.   Yes [provider]  furosemide (LASIX) 20 MG tablet TAKE 1 TABLET  BY MOUTH EVERY DAY AS NEEDED Patient taking differently: Take 20 mg by mouth daily. TAKE 1 TABLET BY MOUTH EVERY DAY 08/30/20  Yes [name], MD  gabapentin (NEURONTIN) 300 MG capsule Take 300 mg by mouth 3 (three) times daily as needed (pain). 09/09/20  Yes [provider]  HYDROcodone-acetaminophen (NORCO/VICODIN) 5-325 MG tablet Take 1 tablet by mouth 2 (two) times daily as needed for severe pain. 08/18/20  Yes [provider]  ipratropium-albuterol (DUONEB) 0.5-2.5 (3) MG/3ML SOLN Take 3 mLs by nebulization every 4 (four) hours as needed. Dx 496 Patient taking differently: Take 3 mLs by nebulization every 4 (four) hours as needed (asthma). Dx 496 03/08/17  Yes Wilhelmina Mcardle, MD  metoprolol succinate (TOPROL-XL) 25 MG 24 hr tablet TAKE 1/2  TABLET BY MOUTH EVERY DAY Patient taking differently: Take 12.5 mg by mouth daily. 10/13/20  Yes Wellington Hampshire, MD  montelukast (SINGULAIR) 10 MG tablet TAKE 1 TABLET BY MOUTH EVERYDAY AT BEDTIME Patient taking differently: Take 10 mg by mouth at bedtime. 10/08/20  Yes [name], MD  mupirocin ointment (BACTROBAN) 2 % Apply 1 application topically daily as needed (FOR [facility] SITE IRRITATION).   Yes [provider]  Naphazoline-Pheniramine 0.027-0.315 % SOLN Place 1 drop into both eyes daily as needed (allergies).    Yes [provider]  pantoprazole (PROTONIX) 40 MG tablet TAKE ONE TABLET EVERY DAY NEEDS FOR OFFICE USE VISIT FOR MORE REFILLS. Patient taking differently: Take 40 mg by mouth daily. 04/08/20  Yes [name], MD  potassium chloride (KLOR-CON) 10 MEQ tablet Take 10 mEq by mouth daily as needed (low potassium).   Yes [provider]  rizatriptan (MAXALT-MLT) 10 MG disintegrating tablet TAKE 1 TABLET (10 MG TOTAL) BY MOUTH DAILY AS NEEDED FOR MIGRAINE. MAY REPEAT FOR ONE DOSE 2 HOURS AFTER THE FIRST ONE IF NEEDED. 06/23/19  Yes [name], MD  sodium chloride (OCEAN) 0.65 % SOLN nasal spray Place 1 spray into both nostrils every 4 (four) hours as needed for congestion.    Yes [provider]  White Petrolatum-Mineral Oil (LUBRICANT EYE) OINT Place 1 application into both eyes 2 (two) times daily as needed (for dry eyes).    Yes [provider]  budesonide (PULMICORT) 0.25 MG/2ML nebulizer solution Take 2 mLs (0.25 mg total) by nebulization 2 (two) times daily. Use after DuoNeb 10/01/19   Tyler Pita, MD  doxycycline (VIBRA-TABS) 100 MG tablet Take 1 tablet (100 mg total) by mouth 2 (two) times daily. 09/22/20   Tyler Pita, MD  guaiFENesin-codeine 100-10 MG/5ML syrup Take 5 mLs by mouth 2 (two) times daily as needed for cough. 05/14/20   McLean-Scocuzza, Nino Glow, MD  HYDROcodone-homatropine [facility]) 5-1.5 MG/5ML  syrup Take 5 mLs by mouth every 8 (eight) hours as needed for cough. 02/26/20   [name], MD  ibuprofen (ADVIL,MOTRIN) 200 MG tablet Take 400-800 mg by mouth daily as needed for headache or moderate pain.    [provider]  ketoconazole (NIZORAL) 2 % shampoo Apply 1 application topically 2 (two) times a week. 08/19/20   [name], MD  methylPREDNISolone (MEDROL DOSEPAK) 4 MG TBPK tablet Take as directed in the package, this is a taper pack 10/13/20   Tyler Pita, MD  potassium chloride 20 MEQ/15ML (10%) SOLN Take 30 mLs (40 mEq total) by mouth daily for 3 days. 11/03/20 11/06/20  [name], MD  zolmitriptan (ZOMIG-ZMT) 5 MG disintegrating  tablet Take 1 tablet (5 mg total) by mouth daily as needed for migraine. 05/27/19 07/23/19  [name], MD   Allergies  Allergen Reactions   Diona Fanti [Aspirin] Shortness Of Breath, Swelling and Other (See Comments)    Tongue swells   Bee Venom Anaphylaxis and Shortness Of Breath   Epinephrine Shortness Of Breath and Palpitations    Occurs when pt is given double the dose, pt would use epipen as needed    Feraheme [Ferumoxytol] Shortness Of Breath    Light headed    Influenza Vaccines Shortness Of Breath and Other (See Comments)    Can take flu vaccines without eggs    Pfizer-[employer] Covid-19 Vacc [Covid-19 Mrna Vacc (Moderna)] Anaphylaxis   Quinolones Swelling and Other (See Comments)    Feet swell   Shellfish Allergy Anaphylaxis   Ciprofloxacin Swelling and Other (See Comments)    ALL MEDS ENDING IN -FLOXACIN MAKE THE FEET SWELL   Clarithromycin Nausea Only    Abdomina pain   Erythromycin Nausea Only    Abdominal pain   Levaquin [Levofloxacin In D5w] Swelling and Other (See Comments)    Feet and legs ache and SWELL   Peanut-Containing Drug Products Other (See Comments)    Wheezing (boiled or raw peanuts)   Septra [Sulfamethoxazole-Trimethoprim] Diarrhea   Telithromycin Nausea Only    Abdominal pain    Zanaflex [Tizanidine] Other (See Comments)    Caused the patient to feel "spaced out" and "not well"   Adhesive [Tape] Rash and Other (See Comments)    Paper works tape works fine     Social History   Tobacco Use   Smoking status: Never   Smokeless tobacco: Never  Substance Use Topics   Alcohol use: Yes    Alcohol/week: 0.0 standard drinks    Comment: occ.    Family History  Problem Relation Age of Onset   Asthma Cousin    COPD Cousin    Breast cancer Maternal Grandmother 7   Asthma Father    Kidney cancer Father    Arrhythmia Father    Lung cancer Father    Lung cancer Paternal Uncle    COPD Paternal Grandfather    Alcohol abuse Mother    Breast cancer Maternal Aunt 84   Bladder Cancer Neg Hx    Colon cancer Neg Hx    Esophageal cancer Neg Hx    Pancreatic cancer Neg Hx    Stomach cancer Neg Hx    Liver disease Neg Hx      Review of Systems  Positive ROS: neg  All other systems have been reviewed and were otherwise negative with the exception of those mentioned in the HPI and as above.  Objective: Vital signs in last 24 hours: Temp:  [97.6 F (36.4 C)] 97.6 F (36.4 C) (08/15 0558) Pulse Rate:  [64] 64 (08/15 0558) Resp:  [17] 17 (08/15 0558) BP: (148)/(55) 148/55 (08/15 0558) SpO2:  [100 %] 100 % (08/15 0558) Weight:  [46.7 kg] 46.7 kg (08/15 0558)  General Appearance: Alert, cooperative, no distress, appears stated age Head: Normocephalic, without obvious abnormality, atraumatic Eyes: PERRL, conjunctiva/corneas clear, EOM's intact    Neck: Supple, symmetrical, trachea midline Back: Symmetric, no curvature, ROM normal, no CVA tenderness Lungs:  respirations unlabored Heart: Regular rate and rhythm Abdomen: Soft, non-tender Extremities: Extremities normal, atraumatic, no cyanosis or edema Pulses: 2+ and symmetric all extremities Skin: Skin color, texture, turgor normal, no rashes or lesions  NEUROLOGIC:   Mental status: Alert and  oriented x4,  no  aphasia, good attention span, fund of knowledge, and memory Motor Exam - grossly normal Sensory Exam - grossly normal Reflexes: 1+ Coordination - grossly normal Gait - grossly normal Balance - grossly normal Cranial Nerves: I: smell Not tested  II: visual acuity  OS: nl    OD: nl  II: visual fields Full to confrontation  II: pupils Equal, round, reactive to light  III,VII: ptosis None  III,IV,VI: extraocular muscles  Full ROM  V: mastication Normal  V: facial light touch sensation  Normal  V,VII: corneal reflex  Present  VII: facial muscle function - upper  Normal  VII: facial muscle function - lower Normal  VIII: hearing Not tested  IX: soft palate elevation  Normal  IX,X: gag reflex Present  XI: trapezius strength  5/5  XI: sternocleidomastoid strength 5/5  XI: neck flexion strength  5/5  XII: tongue strength  Normal    Data Review Lab Results  Component Value Date   WBC 5.8 10/27/2020   HGB 14.4 10/27/2020   HCT 42.3 10/27/2020   MCV 96.4 10/27/2020   PLT 196 10/27/2020   Lab Results  Component Value Date   NA 140 10/27/2020   K 3.8 11/05/2020   CL 102 10/27/2020   CO2 31 10/27/2020   BUN 11 10/27/2020   CREATININE 0.97 10/27/2020   GLUCOSE 66 (L) 10/27/2020   Lab Results  Component Value Date   INR 1.0 10/27/2020    Assessment/Plan:  Estimated body mass index is 18.25 kg/m as calculated from the following:   Height as of this encounter: '5\' 3"'$  (1.6 m).   Weight as of this encounter: 46.7 kg. Patient admitted for LL L2-3 L3-4. Patient has failed a reasonable attempt at conservative therapy.  I explained the condition and procedure to the patient and answered any questions.  Patient wishes to proceed with procedure as planned. Understands risks/ benefits and typical outcomes of procedure.   Eustace Moore 11/08/2020 7:19 AM

## 2020-11-09 ENCOUNTER — Ambulatory Visit: Payer: Medicare Other

## 2020-11-09 ENCOUNTER — Encounter (HOSPITAL_COMMUNITY): Payer: Self-pay | Admitting: Neurological Surgery

## 2020-11-10 ENCOUNTER — Ambulatory Visit: Payer: Medicare Other | Admitting: Primary Care

## 2020-11-10 ENCOUNTER — Ambulatory Visit: Payer: Medicare Other

## 2020-11-19 ENCOUNTER — Ambulatory Visit: Payer: Medicare Other | Admitting: Primary Care

## 2020-11-22 ENCOUNTER — Telehealth: Payer: Self-pay | Admitting: Family Medicine

## 2020-11-22 ENCOUNTER — Telehealth: Payer: Self-pay | Admitting: *Deleted

## 2020-11-22 DIAGNOSIS — E876 Hypokalemia: Secondary | ICD-10-CM

## 2020-11-22 NOTE — Telephone Encounter (Signed)
Patient was exposed to [place] on Friday by her husband and she is unsure if she can still come in to the office for her visit on this Wednesday.Please advise.  Adelis Docter,cma

## 2020-11-22 NOTE — Telephone Encounter (Signed)
I called and rescheduled the patient for after September 16th for the nurse visit and the lab.  I also informed her about the Covid precaution.  And she understood.  Javius Sylla,cma

## 2020-11-22 NOTE — Telephone Encounter (Signed)
Lets get her rescheduled for September 16th or later. She needs to try to stay away from her husband and should test 5 days after the onset of his symptoms. She needs to wear a mask when out in public. If she develops symptoms she should contact us for evaluation.

## 2020-11-22 NOTE — Telephone Encounter (Signed)
Please place future orders for lab appt.  

## 2020-11-22 NOTE — Telephone Encounter (Signed)
Patient was exposed to [place] on Friday by her husband and she is unsure if she can still come in to the office for her visit on this Wednesday.Please advise.

## 2020-11-23 NOTE — Telephone Encounter (Signed)
Labs ordered.

## 2020-11-24 ENCOUNTER — Other Ambulatory Visit: Payer: Medicare Other

## 2020-11-24 ENCOUNTER — Ambulatory Visit: Payer: Medicare Other

## 2020-11-26 ENCOUNTER — Other Ambulatory Visit: Payer: Self-pay | Admitting: Family Medicine

## 2020-12-01 ENCOUNTER — Ambulatory Visit: Payer: Medicare Other

## 2020-12-02 ENCOUNTER — Ambulatory Visit: Payer: Medicare Other | Admitting: Dermatology

## 2020-12-13 ENCOUNTER — Other Ambulatory Visit (INDEPENDENT_AMBULATORY_CARE_PROVIDER_SITE_OTHER): Payer: Medicare Other

## 2020-12-13 ENCOUNTER — Other Ambulatory Visit: Payer: Self-pay

## 2020-12-13 ENCOUNTER — Ambulatory Visit: Payer: Medicare Other

## 2020-12-13 DIAGNOSIS — E538 Deficiency of other specified B group vitamins: Secondary | ICD-10-CM | POA: Diagnosis not present

## 2020-12-13 DIAGNOSIS — E876 Hypokalemia: Secondary | ICD-10-CM

## 2020-12-13 LAB — BASIC METABOLIC PANEL
BUN: 11 mg/dL (ref 6–23)
CO2: 30 mEq/L (ref 19–32)
Calcium: 9.4 mg/dL (ref 8.4–10.5)
Chloride: 102 mEq/L (ref 96–112)
Creatinine, Ser: 1.01 mg/dL (ref 0.40–1.20)
GFR: 56.1 mL/min — ABNORMAL LOW (ref >60.00)
Glucose, Bld: 82 mg/dL (ref 70–99)
Potassium: 3.4 mEq/L — ABNORMAL LOW (ref 3.5–5.1)
Sodium: 141 mEq/L (ref 135–145)

## 2020-12-13 MED ORDER — CYANOCOBALAMIN 1000 MCG/ML IJ SOLN
1000.0000 ug | Freq: Once | INTRAMUSCULAR | Status: AC
  Administered 2020-12-13: 1000 ug via INTRAMUSCULAR

## 2020-12-14 ENCOUNTER — Encounter: Payer: Self-pay | Admitting: Family Medicine

## 2020-12-14 ENCOUNTER — Other Ambulatory Visit (INDEPENDENT_AMBULATORY_CARE_PROVIDER_SITE_OTHER): Payer: Medicare Other

## 2020-12-14 DIAGNOSIS — Z8639 Personal history of other endocrine, nutritional and metabolic disease: Secondary | ICD-10-CM

## 2020-12-14 LAB — IBC + FERRITIN
Ferritin: 120.4 ng/mL (ref 10.0–291.0)
Iron: 107 ug/dL (ref 42–145)
Saturation Ratios: 36.1 % (ref 20.0–50.0)
TIBC: 296.8 ug/dL (ref 250.0–450.0)
Transferrin: 212 mg/dL (ref 212.0–360.0)

## 2020-12-14 NOTE — Telephone Encounter (Signed)
Patient calling back in. States that her iron has been low frequently. Wanted to know if this needs to be added on.   Please advise

## 2020-12-14 NOTE — Telephone Encounter (Signed)
Iron studies ordered and message sent to lab staff to see about adding this on. If we can not add it on we could get this with her next set of labs.

## 2020-12-16 ENCOUNTER — Ambulatory Visit (INDEPENDENT_AMBULATORY_CARE_PROVIDER_SITE_OTHER): Payer: Medicare Other

## 2020-12-16 VITALS — Ht 63.0 in | Wt 103.0 lb

## 2020-12-16 DIAGNOSIS — E876 Hypokalemia: Secondary | ICD-10-CM | POA: Diagnosis not present

## 2020-12-16 DIAGNOSIS — Z0000 Encounter for general adult medical examination without abnormal findings: Secondary | ICD-10-CM | POA: Diagnosis not present

## 2020-12-16 NOTE — Progress Notes (Signed)
Subjective:   Anne [name] is a 70 y.o. female who presents for Medicare Annual (Subsequent) preventive examination.  Review of Systems    No ROS.  Medicare Wellness Virtual Visit.  Visual/audio telehealth visit, UTA vital signs.   See social history for additional risk factors.   Cardiac Risk Factors include: advanced age (>39men, >48 women)     Objective:    Today's Vitals   12/16/20 1318  Weight: 103 lb (46.7 kg)  Height: 5\' 3"  (1.6 m)   Body mass index is 18.25 kg/m.  Advanced Directives 12/16/2020 10/27/2020 08/05/2020 10/14/2019 01/13/2019 10/11/2018 07/01/2018  Does Patient Have a Medical Advance Directive? Yes Yes Yes Yes Yes Yes Yes  Type of Advance Directive - [place];Living will [place];Living will [place];Living will [place];Living will [place];Living will [place];Living will  Does patient want to make changes to medical advance directive? No - Patient declined No - Patient declined - No - Patient declined - No - Patient declined -  Copy of [place] in Chart? - - No - copy requested No - copy requested No - copy requested No - copy requested No - copy requested  Would patient like information on creating a medical advance directive? - - - - - - -    Current Medications (verified) Outpatient Encounter Medications as of 12/16/2020  Medication Sig   acetaminophen (TYLENOL) 500 MG tablet Take 1,000 mg by mouth 2 (two) times daily as needed for moderate pain or headache.   albuterol (VENTOLIN HFA) 108 (90 Base) MCG/ACT inhaler Inhale 2 puffs into the lungs every 6 (six) hours as needed for wheezing or shortness of breath.   azelastine (ASTELIN) 0.1 % nasal spray Place 1 spray into both nostrils 2 (two) times daily. Use in each nostril as directed   budesonide (PULMICORT) 0.25 MG/2ML nebulizer solution Take 2 mLs (0.25 mg total) by  nebulization 2 (two) times daily. Use after DuoNeb   cetirizine (ZYRTEC) 10 MG tablet TAKE 1 TABLET BY MOUTH EVERY DAY (NOT COVERED   clopidogrel (PLAVIX) 75 MG tablet TAKE 1 TABLET BY MOUTH EVERY DAY (Patient taking differently: Take 75 mg by mouth in the morning.)   diphenhydrAMINE (BENADRYL) 25 MG tablet Take 25 mg by mouth daily as needed for allergies.    doxycycline (VIBRA-TABS) 100 MG tablet Take 1 tablet (100 mg total) by mouth 2 (two) times daily.   EPINEPHrine 0.3 mg/0.3 mL IJ SOAJ injection Inject 0.3 mg into the muscle as needed for anaphylaxis.    estradiol (ESTRACE) 1 MG tablet TAKE 1 TABLET BY MOUTH EVERY DAY   fluticasone (FLONASE) 50 MCG/ACT nasal spray Place into both nostrils daily as needed for allergies or rhinitis.   furosemide (LASIX) 20 MG tablet TAKE 1 TABLET BY MOUTH EVERY DAY AS NEEDED (Patient taking differently: Take 20 mg by mouth daily. TAKE 1 TABLET BY MOUTH EVERY DAY)   gabapentin (NEURONTIN) 300 MG capsule Take 300 mg by mouth 3 (three) times daily as needed (pain).   guaiFENesin-codeine 100-10 MG/5ML syrup Take 5 mLs by mouth 2 (two) times daily as needed for cough.   HYDROcodone-acetaminophen (NORCO/VICODIN) 5-325 MG tablet Take 1 tablet by mouth 2 (two) times daily as needed for severe pain.   HYDROcodone-acetaminophen (NORCO/VICODIN) 5-325 MG tablet Take 1 tablet by mouth every 4 (four) hours as needed for moderate pain.   HYDROcodone-homatropine (HYCODAN) 5-1.5 MG/5ML syrup Take  5 mLs by mouth every 8 (eight) hours as needed for cough.   ibuprofen (ADVIL,MOTRIN) 200 MG tablet Take 400-800 mg by mouth daily as needed for headache or moderate pain.   ipratropium-albuterol (DUONEB) 0.5-2.5 (3) MG/3ML SOLN Take 3 mLs by nebulization every 4 (four) hours as needed. Dx 496   ketoconazole (NIZORAL) 2 % shampoo Apply 1 application topically 2 (two) times a week.   methocarbamol (ROBAXIN) 500 MG tablet Take 1 tablet (500 mg total) by mouth 4 (four) times daily.    methylPREDNISolone (MEDROL DOSEPAK) 4 MG TBPK tablet Take as directed in the package, this is a taper pack   metoprolol succinate (TOPROL-XL) 25 MG 24 hr tablet TAKE 1/2 TABLET BY MOUTH EVERY DAY (Patient taking differently: Take 12.5 mg by mouth daily.)   montelukast (SINGULAIR) 10 MG tablet TAKE 1 TABLET BY MOUTH EVERYDAY AT BEDTIME (Patient taking differently: Take 10 mg by mouth at bedtime.)   mupirocin ointment (BACTROBAN) 2 % Apply 1 application topically daily as needed (FOR [facility] SITE IRRITATION).   Naphazoline-Pheniramine 0.027-0.315 % SOLN Place 1 drop into both eyes daily as needed (allergies).    pantoprazole (PROTONIX) 40 MG tablet TAKE ONE TABLET EVERY DAY NEEDS FOR OFFICE USE VISIT FOR MORE REFILLS. (Patient taking differently: Take 40 mg by mouth daily.)   potassium chloride (KLOR-CON) 10 MEQ tablet Take 10 mEq by mouth daily as needed (low potassium).   potassium chloride 20 MEQ/15ML (10%) SOLN Take 30 mLs (40 mEq total) by mouth daily for 3 days.   rizatriptan (MAXALT-MLT) 10 MG disintegrating tablet TAKE 1 TABLET (10 MG TOTAL) BY MOUTH DAILY AS NEEDED FOR MIGRAINE. MAY REPEAT FOR ONE DOSE 2 HOURS AFTER THE FIRST ONE IF NEEDED.   sodium chloride (OCEAN) 0.65 % SOLN nasal spray Place 1 spray into both nostrils every 4 (four) hours as needed for congestion.    White Petrolatum-Mineral Oil (LUBRICANT EYE) OINT Place 1 application into both eyes 2 (two) times daily as needed (for dry eyes).    [DISCONTINUED] zolmitriptan (ZOMIG-ZMT) 5 MG disintegrating tablet Take 1 tablet (5 mg total) by mouth daily as needed for migraine.   No facility-administered encounter medications on file as of 12/16/2020.    Allergies (verified) Asa [aspirin], Bee venom, Epinephrine, Feraheme [ferumoxytol], Influenza vaccines, Pfizer-biontech covid-19 vacc [covid-19 mrna vacc (moderna)], Quinolones, Shellfish allergy, Ciprofloxacin, Clarithromycin, Erythromycin, Levaquin [levofloxacin in d5w], Peanut-containing  drug products, Septra [sulfamethoxazole-trimethoprim], Telithromycin, Zanaflex [tizanidine], and Adhesive [tape]   History: Past Medical History:  Diagnosis Date   Allergic rhinitis    Allergy    multiple, mostly aspirin, levaquin and shellfish.   Anemia    Asthma    Cataract 2014   exraction with len implant   Complication of anesthesia    Breathing problems upon waking up. 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   Compressed cervical disc    COPD (chronic obstructive pulmonary disease) (HCC)    CVA (cerebral infarction)    Dyspnea    Esophageal dysmotility    Heart murmur    as child   History of hiatal hernia    IBS (irritable bowel syndrome)    Kidney lesion 2018   Left   Migraine    PICC (peripherally inserted central catheter) removal 02/20/2017   PONV (postoperative nausea and vomiting)    Problems with swallowing    intermittently   Pulmonary fibrosis ([place])    Shingles    Stroke (HCC)    slurred speech, drawn face, imaging  normal, occurred twice, [facility]-"TIA" if antything" 02/20/17-no residual effects   Tracheostomy in place [facility])    Vocal cord paresis    Past Surgical History:  Procedure Laterality Date   ABDOMINAL HYSTERECTOMY     APPLICATION OF A-CELL OF HEAD/NECK N/A 02/21/2017   Procedure: APPLICATION OF A-CELL OF HEAD/NECK;  Surgeon: Wallace Going, DO;  Location: [place];  Service: Plastics;  Laterality: N/A;   BACK SURGERY     BOTOX INJECTION N/A 07/29/2013   Procedure: BOTOX INJECTION;  Surgeon: Jerene Bears, MD;  Location[facility];  Service: Gastroenterology;  Laterality: N/A;   BOTOX INJECTION N/A 05/04/2015   Procedure: BOTOX INJECTION;  Surgeon: Jerene Bears, MD;  [facility];  Service: Gastroenterology;  Laterality: N/A;   BOTOX INJECTION N/A 02/18/2018   Procedure: BOTOX INJECTION;  Surgeon: Jerene Bears, MD;  [facility];  Service: Gastroenterology;  Laterality: N/A;   BOTOX INJECTION N/A  06/30/2019   Procedure: BOTOX INJECTION;  Surgeon: Jerene Bears, MD;  [facility];  Service: Gastroenterology;  Laterality: N/A;   BOTOX INJECTION N/A 08/05/2020   Procedure: BOTOX INJECTION;  Surgeon: Jerene Bears, MD;  [facility];  Service: Gastroenterology;  Laterality: N/A;   BREAST BIOPSY Left    neg   BREAST SURGERY Left 2002   bx of skin   CHOLECYSTECTOMY     COLONOSCOPY     DIAGNOSTIC LAPAROSCOPY     ESOPHAGEAL MANOMETRY N/A 12/16/2012   Procedure: ESOPHAGEAL MANOMETRY (EM);  Surgeon: Jerene Bears, MD;  [facility];  Service: Gastroenterology;  Laterality: N/A;   ESOPHAGOGASTRODUODENOSCOPY (EGD) WITH PROPOFOL N/A 07/29/2013   Procedure: ESOPHAGOGASTRODUODENOSCOPY (EGD) WITH PROPOFOL;  Surgeon: Jerene Bears, MD;  [facility];  Service: Gastroenterology;  Laterality: N/A;  with botox injection   ESOPHAGOGASTRODUODENOSCOPY (EGD) WITH PROPOFOL N/A 05/04/2015   Procedure: ESOPHAGOGASTRODUODENOSCOPY (EGD) WITH PROPOFOL;  Surgeon: Jerene Bears, MD;  [facility];  Service: Gastroenterology;  Laterality: N/A;   ESOPHAGOGASTRODUODENOSCOPY (EGD) WITH PROPOFOL N/A 02/18/2018   Procedure: ESOPHAGOGASTRODUODENOSCOPY (EGD) WITH PROPOFOL;  Surgeon: Jerene Bears, MD;  [facility];  Service: Gastroenterology;  Laterality: N/A;   ESOPHAGOGASTRODUODENOSCOPY (EGD) WITH PROPOFOL N/A 06/30/2019   Procedure: ESOPHAGOGASTRODUODENOSCOPY (EGD) WITH PROPOFOL;  Surgeon: Jerene Bears, MD;  [facility];  Service: Gastroenterology;  Laterality: N/A;   ESOPHAGOGASTRODUODENOSCOPY (EGD) WITH PROPOFOL N/A 08/05/2020   Procedure: ESOPHAGOGASTRODUODENOSCOPY (EGD) WITH PROPOFOL;  Surgeon: Jerene Bears, MD;  [facility];  Service: Gastroenterology;  Laterality: N/A;   EYE SURGERY     Catarct surgery 2014   Eye Surgery AS Child Left    INCISION AND DRAINAGE OF WOUND N/A 02/21/2017   Procedure: IRRIGATION AND DEBRIDEMENT WOUND  NECK;  Surgeon: Wallace Going, DO;  Location: [place];  Service: Plastics;  Laterality: N/A;   JEJUNOSTOMY FEEDING TUBE     x2 both failed. no longer has   LUMBAR LAMINECTOMY/DECOMPRESSION MICRODISCECTOMY Bilateral 05/13/2018   Procedure: Laminectomy and Foraminotomy - Lumbar four-Lumbar five- bilateral;  Surgeon: Eustace Moore, MD;  Location: [place];  Service: Neurosurgery;  Laterality: Bilateral;   LUMBAR LAMINECTOMY/DECOMPRESSION MICRODISCECTOMY Bilateral 11/08/2020   Procedure: Laminectomy and Foraminotomy - bilateral - Lumbar two-Lumbar three - Lumbar three-Lumbar four;  Surgeon: Eustace Moore, MD;  Location: [place];  Service: Neurosurgery;  Laterality: Bilateral;   MULTIPLE TOOTH EXTRACTIONS     2 teeth removed   OOPHORECTOMY     POSTERIOR CERVICAL FUSION/FORAMINOTOMY N/A 01/10/2017  Procedure: LAMINECTOMY AND FORAMINOTOMY CERVICAL FOUR-CERVICAL FIVE, CERVICAL FIVE-SIX POSTERIOR CERVICAL INSTRUMENT FUSION CERVICAL THREE-CERVICAL SEVEN,CERVICAL LAMINECTOMY CERVICAL THREE-CERVICAL SEVEN.;  Surgeon: Eustace Moore, MD;  Location: [place];  Service: Neurosurgery;  Laterality: N/A;  posterior   TONSILLECTOMY     TRACHEOSTOMY  1996   done at [facility], Dr. Kathyrn Sheriff   TUBAL LIGATION     VIDEO BRONCHOSCOPY Bilateral 11/20/2012   Procedure: VIDEO BRONCHOSCOPY WITH FLUORO;  Surgeon: Juanito Doom, MD;  [facility];  Service: Cardiopulmonary;  Laterality: Bilateral;   Family History  Problem Relation Age of Onset   Asthma Cousin    COPD Cousin    Breast cancer Maternal Grandmother 15   Asthma Father    Kidney cancer Father    Arrhythmia Father    Lung cancer Father    Lung cancer Paternal Uncle    COPD Paternal Grandfather    Alcohol abuse Mother    Breast cancer Maternal Aunt 54   Bladder Cancer Neg Hx    Colon cancer Neg Hx    Esophageal cancer Neg Hx    Pancreatic cancer Neg Hx    Stomach cancer Neg Hx    Liver disease Neg Hx    Social History   Socioeconomic  History   Marital status: Married    Spouse name: Not on file   Number of children: 4   Years of education: Not on file   Highest education level: Not on file  Occupational History   Not on file  Tobacco Use   Smoking status: Never   Smokeless tobacco: Never  Vaping Use   Vaping Use: Never used  Substance and Sexual Activity   Alcohol use: Yes    Alcohol/week: 0.0 standard drinks    Comment: occ.   Drug use: No   Sexual activity: Yes  Other Topics Concern   Not on file  Social History Narrative   Lives in [place] with husband.  She has four children.   Retired from [employer]: Low Risk    Difficulty of Paying Living Expenses: Not hard at all  Food Insecurity: No Food Insecurity   Worried About Charity fundraiser in the Last Year: Never true   Arboriculturist in the Last Year: Never true  Transportation Needs: No Transportation Needs   Lack of Transportation (Medical): No   Lack of Transportation (Non-Medical): No  Physical Activity: Not on file  Stress: No Stress Concern Present   Feeling of Stress : Not at all  Social Connections: Unknown   Frequency of Communication with Friends and Family: More than three times a week   Frequency of Social Gatherings with Friends and Family: More than three times a week   Attends Religious Services: Not on Electrical engineer or Organizations: Yes   Attends Music therapist: More than 4 times per year   Marital Status: Married    Tobacco Counseling Counseling given: Not Answered   Clinical Intake:  Pre-visit preparation completed: Yes        Diabetes: No  How often do you need to have someone help you when you read instructions, pamphlets, or other written materials from your doctor or pharmacy?: 1 - Never    Interpreter Needed?: No      Activities of Daily Living In your present state of health, do you have any  difficulty performing the following activities: 12/16/2020 10/27/2020  Hearing? Y N  Comment Hearing aid -  Vision? N N  Difficulty concentrating or making decisions? N N  Walking or climbing stairs? Y N  Comment Paces self -  Dressing or bathing? N N  Doing errands, shopping? N N  Preparing Food and eating ? N -  Using the Toilet? N -  In the past six months, have you accidently leaked urine? N -  Do you have problems with loss of bowel control? N -  Managing your Medications? N -  Managing your Finances? N -  Housekeeping or managing your Housekeeping? N -  Some recent data might be hidden    Patient Care Team: [name], MD as PCP - General (Family Medicine) Wellington Hampshire, MD as PCP - Cardiology (Cardiology) Nickie Retort, MD (Inactive) as Consulting Physician (Urology) Pyrtle, Lajuan Lines, MD as Consulting Physician (Gastroenterology)  Indicate any recent Medical Services you may have received from other than [facility] providers in the past year (date may be approximate).     Assessment:   This is a routine wellness examination for George.  I connected with Shanquita today by telephone and verified that I am speaking with the correct person using two identifiers. Location patient: home Location provider: work Persons participating in the virtual visit: patient, Marine scientist.    I discussed the limitations, risks, security and privacy concerns of performing an evaluation and management service by telephone and the availability of in person appointments. The patient expressed understanding and verbally consented to this telephonic visit.    Interactive audio and video telecommunications were attempted between this provider and patient, however failed, due to patient having technical difficulties OR patient did not have access to video capability.  We continued and completed visit with audio only.  Some vital signs may be absent or patient reported.   Hearing/Vision  screen Hearing Screening - Comments:: Hearing aids Vision Screening - Comments:: Followed by [facility], Dr. Jeni Salles  Wears corrective lenses Cataract extraction, bilateral  They have seen their ophthalmologist in the last 12 months.   Dietary issues and exercise activities discussed:   Healthy diet Good water intake   Goals Addressed               This Visit's Progress     Patient Stated     Maintain Healthy Lifestyle (pt-stated)        Stay active        Depression Screen PHQ 2/9 Scores 12/16/2020 04/19/2020 10/14/2019 08/19/2019 04/18/2019 10/11/2018 10/11/2018  PHQ - 2 Score 0 0 0 0 0 0 0  PHQ- 9 Score - - - - - 0 -    Fall Risk Fall Risk  05/14/2020 04/19/2020 10/14/2019 08/19/2019 04/18/2019  Falls in the past year? 0 0 1 1 0  Number falls in past yr: 0 0 0 0 0  Injury with Fall? 0 0 0 0 -  Comment - - Lost her balance while helping husband while he was on a ladder - -  Follow up Falls evaluation completed Falls evaluation completed Falls evaluation completed Falls evaluation completed Falls evaluation completed    [place]: Adequate lighting in your home to reduce risk of falls? Yes   ASSISTIVE DEVICES UTILIZED TO PREVENT FALLS: Use of a cane, walker or w/c? No   TIMED UP AND GO: Was the test performed? No .   Cognitive Function: Patient is alert  and oriented x3. Enjoys sewing, organizing jewelry shows and other brain health engaging exercises.  Denies difficulty focusing, making decisions, memory loss.  MMSE/6CIT deferred. Normal by direct communication/observation.  MMSE - Mini Mental State Exam 10/14/2019 10/09/2017  Not completed: Unable to complete -  Orientation to time - 5  Orientation to Place - 5  Registration - 3  Attention/ Calculation - 5  Recall - 3  Language- name 2 objects - 2  Language- repeat - 1  Language- follow 3 step command - 3  Language- read & follow direction - 1  Write a sentence - 1   Copy design - 1  Total score - 30     6CIT Screen 10/11/2018  What Year? 0 points  What month? 0 points  What time? 0 points  Count back from 20 0 points  Months in reverse 0 points  Repeat phrase 0 points  Total Score 0    Immunizations Immunization History  Administered Date(s) Administered   Influenza Inj Mdck Quad Pf 01/15/2019, 01/16/2020   Influenza, Quadrivalent, Recombinant, Inj, Pf 02/06/2017, 12/19/2017   Influenza,inj,Quad PF,6+ Mos 12/19/2012   Influenza,trivalent, recombinat, inj, PF 01/05/2014   Janssen (J&J) SARS-COV-2 Vaccination 01/05/2020, 03/10/2020   PFIZER(Purple Top)SARS-COV-2 Vaccination 05/09/2019   Pneumococcal Conjugate-13 01/23/2014   TDAP status: Due, Education has been provided regarding the importance of this vaccine. Advised may receive this vaccine at local pharmacy or Health Dept. Aware to provide a copy of the vaccination record if obtained from local pharmacy or Health Dept. Verbalized acceptance and understanding. Deferred.   Shingrix vaccine- Due, Education has been provided regarding the importance of this vaccine. Advised may receive this vaccine at local pharmacy or Health Dept. Aware to provide a copy of the vaccination record if obtained from local pharmacy or Health Dept. Verbalized acceptance and understanding.  Health Maintenance Health Maintenance  Topic Date Due   Zoster Vaccines- Shingrix (1 of 2) 03/17/2021 (Originally 09/07/1999)   INFLUENZA VACCINE  06/24/2021 (Originally 10/25/2020)   TETANUS/TDAP  12/16/2021 (Originally 09/06/1969)   MAMMOGRAM  06/12/2022   COLONOSCOPY (Pts 45-98yrs Insurance coverage will need to be confirmed)  10/03/2027   DEXA SCAN  Completed   Hepatitis C Screening  Completed   HPV VACCINES  Aged Out   COVID-19 Vaccine  Discontinued   Vision Screening: Recommended annual ophthalmology exams for early detection of glaucoma and other disorders of the eye.  Dental Screening: Recommended annual dental exams  for proper oral hygiene  Community Resource Referral / Chronic Care Management: CRR required this visit?  No   CCM required this visit?  No      Plan:   Keep all routine maintenance appointments.   Labs ordered and appointment scheduled per note. Patient out town until 10/8. Scheduled same day as B12 for convenience to the patient. Please advise if changes need to be made.   I have personally reviewed and noted the following in the patient's chart:   Medical and social history Use of alcohol, tobacco or illicit drugs  Current medications and supplements including opioid prescriptions. Currently taking opioid. Followed by Neurosurgery, Dr. Ronnald Ramp. Functional ability and status Nutritional status Physical activity Advanced directives List of other physicians Hospitalizations, surgeries, and ER visits in previous 12 months Vitals Screenings to include cognitive, depression, and falls Referrals and appointments  In addition, I have reviewed and discussed with patient certain preventive protocols, quality metrics, and best practice recommendations. A written personalized care plan for preventive services as well as general  preventive health recommendations were provided to patient via mychart.     Varney Biles, LPN   04/16/6242

## 2020-12-16 NOTE — Patient Instructions (Addendum)
Anne [name] , Thank you for taking time to come for your Medicare Wellness Visit. I appreciate your ongoing commitment to your health goals. Please review the following plan we discussed and let me know if I can assist you in the future.   These are the goals we discussed:  Goals       Patient Stated     Follow up with Primary Care Provider (pt-stated)      Keep all routine maintenance appointments as scheduled Eat Healthy       Maintain Healthy Lifestyle (pt-stated)      Stay active         This is a list of the screening recommended for you and due dates:  Health Maintenance  Topic Date Due   Zoster (Shingles) Vaccine (1 of 2) 03/17/2021*   Flu Shot  06/24/2021*   Tetanus Vaccine  12/16/2021*   Mammogram  06/12/2022   Colon Cancer Screening  10/03/2027   DEXA scan (bone density measurement)  Completed   Hepatitis C Screening: USPSTF Recommendation to screen - Ages 67-79 yo.  Completed   HPV Vaccine  Aged Out   COVID-19 Vaccine  Discontinued  *Topic was postponed. The date shown is not the original due date.    Advanced directives: End of life planning; Advance aging; Advanced directives discussed.  Copy of current HCPOA/Living Will requested.    Conditions/risks identified: none new  Follow up in one year for your annual wellness visit    Preventive Care 65 Years and Older, Female Preventive care refers to lifestyle choices and visits with your health care provider that can promote health and wellness. What does preventive care include? A yearly physical exam. This is also called an annual well check. Dental exams once or twice a year. Routine eye exams. Ask your health care provider how often you should have your eyes checked. Personal lifestyle choices, including: Daily care of your teeth and gums. Regular physical activity. Eating a healthy diet. Avoiding tobacco and drug use. Limiting alcohol use. Practicing safe sex. Taking low-dose aspirin every  day. Taking vitamin and mineral supplements as recommended by your health care provider. What happens during an annual well check? The services and screenings done by your health care provider during your annual well check will depend on your age, overall health, lifestyle risk factors, and family history of disease. Counseling  Your health care provider may ask you questions about your: Alcohol use. Tobacco use. Drug use. Emotional well-being. Home and relationship well-being. Sexual activity. Eating habits. History of falls. Memory and ability to understand (cognition). Work and work Statistician. Reproductive health. Screening  You may have the following tests or measurements: Height, weight, and BMI. Blood pressure. Lipid and cholesterol levels. These may be checked every 5 years, or more frequently if you are over 50 years old. Skin check. Lung cancer screening. You may have this screening every year starting at age 26 if you have a 30-pack-year history of smoking and currently smoke or have quit within the past 15 years. Fecal occult blood test (FOBT) of the stool. You may have this test every year starting at age 41. Flexible sigmoidoscopy or colonoscopy. You may have a sigmoidoscopy every 5 years or a colonoscopy every 10 years starting at age 31. Hepatitis C blood test. Hepatitis B blood test. Sexually transmitted disease (STD) testing. Diabetes screening. This is done by checking your blood sugar (glucose) after you have not eaten for a while (fasting). You may have this done  every 1-3 years. Bone density scan. This is done to screen for osteoporosis. You may have this done starting at age 41. Mammogram. This may be done every 1-2 years. Talk to your health care provider about how often you should have regular mammograms. Talk with your health care provider about your test results, treatment options, and if necessary, the need for more tests. Vaccines  Your health care  provider may recommend certain vaccines, such as: Influenza vaccine. This is recommended every year. Tetanus, diphtheria, and acellular pertussis (Tdap, Td) vaccine. You may need a Td booster every 10 years. Zoster vaccine. You may need this after age 7. Pneumococcal 13-valent conjugate (PCV13) vaccine. One dose is recommended after age 16. Pneumococcal polysaccharide (PPSV23) vaccine. One dose is recommended after age 75. Talk to your health care provider about which screenings and vaccines you need and how often you need them. This information is not intended to replace advice given to you by your health care provider. Make sure you discuss any questions you have with your health care provider. Document Released: 04/09/2015 Document Revised: 12/01/2015 Document Reviewed: 01/12/2015 Elsevier Interactive Patient Education  2017 [place] Prevention in the Home Falls can cause injuries. They can happen to people of all ages. There are many things you can do to make your home safe and to help prevent falls. What can I do on the outside of my home? Regularly fix the edges of walkways and driveways and fix any cracks. Remove anything that might make you trip as you walk through a door, such as a raised step or threshold. Trim any bushes or trees on the path to your home. Use bright outdoor lighting. Clear any walking paths of anything that might make someone trip, such as rocks or tools. Regularly check to see if handrails are loose or broken. Make sure that both sides of any steps have handrails. Any raised decks and porches should have guardrails on the edges. Have any leaves, snow, or ice cleared regularly. Use sand or salt on walking paths during winter. Clean up any spills in your garage right away. This includes oil or grease spills. What can I do in the bathroom? Use night lights. Install grab bars by the toilet and in the tub and shower. Do not use towel bars as grab  bars. Use non-skid mats or decals in the tub or shower. If you need to sit down in the shower, use a plastic, non-slip stool. Keep the floor dry. Clean up any water that spills on the floor as soon as it happens. Remove soap buildup in the tub or shower regularly. Attach bath mats securely with double-sided non-slip rug tape. Do not have throw rugs and other things on the floor that can make you trip. What can I do in the bedroom? Use night lights. Make sure that you have a light by your bed that is easy to reach. Do not use any sheets or blankets that are too big for your bed. They should not hang down onto the floor. Have a firm chair that has side arms. You can use this for support while you get dressed. Do not have throw rugs and other things on the floor that can make you trip. What can I do in the kitchen? Clean up any spills right away. Avoid walking on wet floors. Keep items that you use a lot in easy-to-reach places. If you need to reach something above you, use a strong step stool that has  a grab bar. Keep electrical cords out of the way. Do not use floor polish or wax that makes floors slippery. If you must use wax, use non-skid floor wax. Do not have throw rugs and other things on the floor that can make you trip. What can I do with my stairs? Do not leave any items on the stairs. Make sure that there are handrails on both sides of the stairs and use them. Fix handrails that are broken or loose. Make sure that handrails are as long as the stairways. Check any carpeting to make sure that it is firmly attached to the stairs. Fix any carpet that is loose or worn. Avoid having throw rugs at the top or bottom of the stairs. If you do have throw rugs, attach them to the floor with carpet tape. Make sure that you have a light switch at the top of the stairs and the bottom of the stairs. If you do not have them, ask someone to add them for you. What else can I do to help prevent  falls? Wear shoes that: Do not have high heels. Have rubber bottoms. Are comfortable and fit you well. Are closed at the toe. Do not wear sandals. If you use a stepladder: Make sure that it is fully opened. Do not climb a closed stepladder. Make sure that both sides of the stepladder are locked into place. Ask someone to hold it for you, if possible. Clearly mark and make sure that you can see: Any grab bars or handrails. First and last steps. Where the edge of each step is. Use tools that help you move around (mobility aids) if they are needed. These include: Canes. Walkers. Scooters. Crutches. Turn on the lights when you go into a dark area. Replace any light bulbs as soon as they burn out. Set up your furniture so you have a clear path. Avoid moving your furniture around. If any of your floors are uneven, fix them. If there are any pets around you, be aware of where they are. Review your medicines with your doctor. Some medicines can make you feel dizzy. This can increase your chance of falling. Ask your doctor what other things that you can do to help prevent falls. This information is not intended to replace advice given to you by your health care provider. Make sure you discuss any questions you have with your health care provider. Document Released: 01/07/2009 Document Revised: 08/19/2015 Document Reviewed: 04/17/2014 Elsevier Interactive Patient Education  2017 [place].  Opioid Pain Medicine Management Opioid pain medicines are strong medicines that are used to treat bad or very bad pain. When you take them for a short time, they can help you: Sleep better. Do better in physical therapy. Feel better during the first few days after you get hurt. Recover from surgery. Only take these medicines if a doctor says that you can. You should only take them for a short time. This is because opioids can be very addictive. This means that they are hard to stop taking. The longer  you take opioids, the harder it may be to stop taking them. What are the risks? Opioids can cause problems (side effects). Taking them for more than 3 days raises your chance of problems, such as: Trouble pooping (constipation). Feeling sick to your stomach (nausea). Vomiting. Feeling very sleepy. Confusion. Not being able to stop taking the medicine. Breathing problems. Taking opioids for a long time can make it hard for you to do daily  tasks. It can also put you at risk for: Car accidents. Depression. Suicide. Heart attack. Taking too much of the medicine (overdose). This can lead to death. What is a pain treatment plan? A pain treatment plan is a plan made by you and your doctor. Work with your doctor to make a plan for treating your pain. To help you do this: Talk about the goals of your treatment, including: How much pain you might expect to have. How you will manage the pain. Talk about the risks and benefits of taking these medicines for your condition. Remember that a good treatment plan uses more than one approach and lowers the risks of side effects. Tell your doctor about the amount of medicines you take and about any drug or alcohol use. Get your pain medicine prescriptions from only one doctor. Pain can be managed with other treatments. Work with your doctor to find other ways to help your pain, such as: Physical therapy or doing gentle exercises. Counseling. Eating healthy foods. Massage. Meditation. Other pain medicines. How to use opioid pain medicine safely Taking medicine Take your pain medicine exactly as told by your doctor. Take it only when you need it. If your pain is not too bad, you may take less medicine if your doctor allows. If you have no pain, do not take the medicine unless your doctor tells you to take it. If your pain is very bad, do not take more medicine than your doctor told you to take. Call your doctor to know what to do. Write down the  times when you take your pain medicine. Look at the times before you take your next dose. Take other over-the-counter or prescription medicines only as told by your doctor. Keeping yourself and others safe  While you are taking opioids: Do not drive, use machines, or power tools. Do not sign important papers (legal documents). Do not drink alcohol. Do not take sleeping pills. Do not take care of children by yourself. Do not do activities where you need to climb or be in high places, like working on a ladder. Do not go to a lake, river, ocean, swimming pool, or hot tub. Keep your opioids locked up or in a place where children cannot reach them. Do not share your pain medicine with anyone. Stopping your use of opioids If you have been taking opioids for more than a few weeks, you may need to slowly decrease (taper) how much you take until you stop taking them. Doing this can lower your chance of having symptoms.  Symptoms that come from suddenly stopping the use of opioids include: Pain and cramping in your belly (abdomen). Feeling sick to your stomach (nausea).z Sweating. Feeling very sleepy. Feeling restless. Shaking you cannot control (tremors). Cravings for the medicine. Do not try to stop taking them by yourself. Work with your doctor to stop. Your doctor will help you take less until you are not taking the medicine at all. Getting rid of unused pills Do not save any pills that you did not use. Get rid of the pills by: Taking them to a take-back program in your area. Bringing them to a pharmacy that receives unused pills. Flushing them down the toilet. Check the label or package insert of your medicine to see whether this is safe to do. Throwing them in the trash. Check the label or package insert of your medicine to see whether this is safe to do. If it is safe to throw them out: Take the  pills out of their container. Put the pills into a container you can seal. Mix the pills  with used coffee grounds, food scraps, dirt, or cat litter. Put this in the trash. Follow these instructions at home: Activity Do exercises as told by your doctor. Avoid doing things that make your pain worse. Return to your normal activities as told by your doctor. Ask your doctor what activities are safe for you. General instructions You may need to take these actions to prevent or treat constipation: Drink enough fluid to keep your pee (urine) pale yellow. Take over-the-counter or prescription medicines. Eat foods that are high in fiber. These include beans, whole grains, and fresh fruits and vegetables. Limit foods that are high in fat and sugar. These include fried or sweet foods. Keep all follow-up visits. Where to find support If you have been taking opioids for a long time, get help from a local support group or counselor. Ask your doctor about this. Where to find more information Centers for Disease Control and Prevention (CDC): http://www.wolf.info/ [place]. Food and Drug Administration (FDA): [place] Get help right away if: You may have taken too much of an opioid (overdosed). Common symptoms of an overdose: Your breathing is slower or more shallow than normal. You have a very slow heartbeat. Your speech is not normal. You vomit or you feel as if you may vomit. The black centers of your eyes (pupils) are smaller than normal. You have other potential symptoms: You feel very confused. You faint. You are very sleepy. You have cold skin. You have blue lips or fingernails. You have thoughts of harming yourself or harming others. These symptoms may be an emergency. Get help right away. Call your local emergency services (911 in the [place] Do not wait to see if the symptoms will go away. Do not drive yourself to the hospital. Get help right away if you feel like you may hurt yourself or others, or have thoughts about taking your own life. Go to your nearest emergency room or: Call your  local emergency services (911 in the [place] Call the [facility] at 340-830-7635. Call a suicide crisis helpline, such as the [place] at (801)737-2262. This is open 24 hours a day. Text the Crisis Text Line at 202-540-2383. Summary Opioid are strong medicines that are used to treat bad or very bad pain. A pain treatment plan is a plan made by you and your doctor. Work with your doctor to make a plan for treating your pain. If you think that you or someone else may have taken too much of an opioid, get help right away. This information is not intended to replace advice given to you by your health care provider. Make sure you discuss any questions you have with your health care provider. Document Revised: 06/23/2020 Document Reviewed: 06/23/2020 Elsevier Patient Education  [place].

## 2021-01-05 ENCOUNTER — Other Ambulatory Visit: Payer: Self-pay | Admitting: Family Medicine

## 2021-01-10 ENCOUNTER — Other Ambulatory Visit: Payer: Self-pay

## 2021-01-10 ENCOUNTER — Encounter: Payer: Self-pay | Admitting: Primary Care

## 2021-01-10 ENCOUNTER — Ambulatory Visit (INDEPENDENT_AMBULATORY_CARE_PROVIDER_SITE_OTHER): Payer: Medicare Other | Admitting: Primary Care

## 2021-01-10 DIAGNOSIS — J4530 Mild persistent asthma, uncomplicated: Secondary | ICD-10-CM | POA: Diagnosis not present

## 2021-01-10 MED ORDER — FLUTICASONE PROPIONATE 50 MCG/ACT NA SUSP: 1.0000 | Freq: Every day | NASAL | 3 refills | Status: DC

## 2021-01-10 MED ORDER — METHYLPREDNISOLONE 4 MG PO TBPK: ORAL_TABLET | ORAL | 0 refills | Status: DC

## 2021-01-10 NOTE — Patient Instructions (Addendum)
Recommendations: - Use ipratropium-albuterol nebulizer three times a day as needed during allergy flare up  - Use Astelin and Flonase nasal spray daily as needed for post nasal drip - Take steroid dose pack as prescribed for asthma flare   Follow-up: - 3 months with Dr. Patsey Berthold or sooner fi needed

## 2021-01-10 NOTE — Assessment & Plan Note (Addendum)
-   Acute asthma exacerbation secondary to seasonal allergies. Not able to use Pulmicort nebulizer BID as prescribed d.t headache. Uses Ipratropium-albuterol nebulizer as needed only. She is compliant with Singulair and Zyrtec. She had wheezing on exam today. VSS, O2 98% RA.  Advised she start taking Duoneb TID and medrol dose pack as prescribed. Resume Astelin/fluticasion nasal spray daily. Unfortunately no other nebulizer choices for maintenance. She has multiple intolerances. Recommend checking eos and IgE to see if candidate for biologic at follow-up. FU in 3 months or sooner if needed.

## 2021-01-10 NOTE — Progress Notes (Addendum)
@Patient  ID: Anne [name], female    DOB: September 28, 1950, 70 y.o.   MRN: [id number]  No chief complaint on file.   Referring provider: [name], MD  HPI: 70 year old female, never smoked.  Past medical history significant for moderate persistent allergic asthma.  Patient of Dr. Patsey Berthold, last seen in office on 10/13/2020.  Maintained on Pulmicort nebulizer 0.25 mg twice daily, Singulair, Flonase/Astelin.  01/10/2021- Interim hx  Patient presents today for 46-month follow-up. She is currently dealing with some allergy symptoms. She has some nasal congestion and chest tightness. She was unable to use budesonide d/t headaches. She only uses ipratropium-albuterol nebulizer as needed. She is compliant with Singulair and Zyrtec.   Allergies  Allergen Reactions   Asa [Aspirin] Shortness Of Breath, Swelling and Other (See Comments)    Tongue swells   Bee Venom Anaphylaxis and Shortness Of Breath   Epinephrine Shortness Of Breath and Palpitations    Occurs when pt is given double the dose, pt would use epipen as needed    Feraheme [Ferumoxytol] Shortness Of Breath    Light headed    Influenza Vaccines Shortness Of Breath and Other (See Comments)    Can take flu vaccines without eggs    Pfizer-Biontech Covid-19 Vacc [Covid-19 Mrna Vacc (Moderna)] Anaphylaxis   Quinolones Swelling and Other (See Comments)    Feet swell   Shellfish Allergy Anaphylaxis   Ciprofloxacin Swelling and Other (See Comments)    ALL MEDS ENDING IN -FLOXACIN MAKE THE FEET SWELL   Clarithromycin Nausea Only    Abdomina pain   Erythromycin Nausea Only    Abdominal pain   Levaquin [Levofloxacin In D5w] Swelling and Other (See Comments)    Feet and legs ache and SWELL   Peanut-Containing Drug Products Other (See Comments)    Wheezing (boiled or raw peanuts)   Septra [Sulfamethoxazole-Trimethoprim] Diarrhea   Telithromycin Nausea Only    Abdominal pain   Zanaflex [Tizanidine] Other (See Comments)    Caused  the patient to feel "spaced out" and "not well"   Adhesive [Tape] Rash and Other (See Comments)    Paper works tape works fine     Immunization History  Administered Date(s) Administered   Influenza Inj Mdck Quad Pf 01/15/2019, 01/16/2020   Influenza, Quadrivalent, Recombinant, Inj, Pf 02/06/2017, 12/19/2017   Influenza,inj,Quad PF,6+ Mos 12/19/2012   Influenza,trivalent, recombinat, inj, PF 01/05/2014   Janssen (J&J) SARS-COV-2 Vaccination 01/05/2020, 03/10/2020   PFIZER(Purple Top)SARS-COV-2 Vaccination 05/09/2019   Pneumococcal Conjugate-13 01/23/2014    Past Medical History:  Diagnosis Date   Allergic rhinitis    Allergy    multiple, mostly aspirin, levaquin and shellfish.   Anemia    Asthma    Cataract 2014   exraction with len implant   Complication of anesthesia    Breathing problems upon waking up. 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   Compressed cervical disc    COPD (chronic obstructive pulmonary disease) (HCC)    CVA (cerebral infarction)    Dyspnea    Esophageal dysmotility    Heart murmur    as child   History of hiatal hernia    IBS (irritable bowel syndrome)    Kidney lesion 2018   Left   Migraine    PICC (peripherally inserted central catheter) removal 02/20/2017   PONV (postoperative nausea and vomiting)    Problems with swallowing    intermittently   Pulmonary fibrosis ([place])    Shingles    Stroke ([place])  slurred speech, drawn face, imaging normal, occurred twice, [facility]-"TIA" if antything" 02/20/17-no residual effects   Tracheostomy in place [facility])    Vocal cord paresis     Tobacco History: Social History   Tobacco Use  Smoking Status Never  Smokeless Tobacco Never   Counseling given: Not Answered   Outpatient Medications Prior to Visit  Medication Sig Dispense Refill   acetaminophen (TYLENOL) 500 MG tablet Take 1,000 mg by mouth 2 (two) times daily as needed for moderate pain or headache.     albuterol  (VENTOLIN HFA) 108 (90 Base) MCG/ACT inhaler Inhale 2 puffs into the lungs every 6 (six) hours as needed for wheezing or shortness of breath. 18 g 2   azelastine (ASTELIN) 0.1 % nasal spray Place 1 spray into both nostrils 2 (two) times daily. Use in each nostril as directed 30 mL 6   budesonide (PULMICORT) 0.25 MG/2ML nebulizer solution Take 2 mLs (0.25 mg total) by nebulization 2 (two) times daily. Use after DuoNeb 120 mL 11   cetirizine (ZYRTEC) 10 MG tablet TAKE 1 TABLET BY MOUTH EVERY DAY (NOT COVERED 90 tablet 0   clopidogrel (PLAVIX) 75 MG tablet TAKE 1 TABLET BY MOUTH EVERY DAY (Patient taking differently: Take 75 mg by mouth in the morning.) 90 tablet 1   diphenhydrAMINE (BENADRYL) 25 MG tablet Take 25 mg by mouth daily as needed for allergies.      doxycycline (VIBRA-TABS) 100 MG tablet Take 1 tablet (100 mg total) by mouth 2 (two) times daily. 14 tablet 0   EPINEPHrine 0.3 mg/0.3 mL IJ SOAJ injection Inject 0.3 mg into the muscle as needed for anaphylaxis.      estradiol (ESTRACE) 1 MG tablet TAKE 1 TABLET BY MOUTH EVERY DAY 90 tablet 1   furosemide (LASIX) 20 MG tablet TAKE 1 TABLET BY MOUTH EVERY DAY AS NEEDED (Patient taking differently: Take 20 mg by mouth daily. TAKE 1 TABLET BY MOUTH EVERY DAY) 90 tablet 1   gabapentin (NEURONTIN) 300 MG capsule Take 300 mg by mouth 3 (three) times daily as needed (pain).     guaiFENesin-codeine 100-10 MG/5ML syrup Take 5 mLs by mouth 2 (two) times daily as needed for cough. 120 mL 0   HYDROcodone-acetaminophen (NORCO/VICODIN) 5-325 MG tablet Take 1 tablet by mouth 2 (two) times daily as needed for severe pain.     HYDROcodone-acetaminophen (NORCO/VICODIN) 5-325 MG tablet Take 1 tablet by mouth every 4 (four) hours as needed for moderate pain. 20 tablet 0   HYDROcodone-homatropine (HYCODAN) 5-1.5 MG/5ML syrup Take 5 mLs by mouth every 8 (eight) hours as needed for cough. 120 mL 0   ibuprofen (ADVIL,MOTRIN) 200 MG tablet Take 400-800 mg by mouth daily  as needed for headache or moderate pain.     ipratropium-albuterol (DUONEB) 0.5-2.5 (3) MG/3ML SOLN Take 3 mLs by nebulization every 4 (four) hours as needed. Dx 496 120 mL 2   ketoconazole (NIZORAL) 2 % shampoo Apply 1 application topically 2 (two) times a week. 120 mL 0   methocarbamol (ROBAXIN) 500 MG tablet Take 1 tablet (500 mg total) by mouth 4 (four) times daily. 45 tablet 0   metoprolol succinate (TOPROL-XL) 25 MG 24 hr tablet TAKE 1/2 TABLET BY MOUTH EVERY DAY (Patient taking differently: Take 12.5 mg by mouth daily.) 45 tablet 3   montelukast (SINGULAIR) 10 MG tablet TAKE 1 TABLET BY MOUTH EVERYDAY AT BEDTIME (Patient taking differently: Take 10 mg by mouth at bedtime.) 90 tablet 1   mupirocin ointment (BACTROBAN)  2 % Apply 1 application topically daily as needed (FOR [facility] SITE IRRITATION).     Naphazoline-Pheniramine 0.027-0.315 % SOLN Place 1 drop into both eyes daily as needed (allergies).      pantoprazole (PROTONIX) 40 MG tablet TAKE ONE TABLET EVERY DAY NEEDS FOR OFFICE USE VISIT FOR MORE REFILLS. 90 tablet 2   potassium chloride (KLOR-CON) 10 MEQ tablet Take 10 mEq by mouth daily as needed (low potassium).     rizatriptan (MAXALT-MLT) 10 MG disintegrating tablet TAKE 1 TABLET (10 MG TOTAL) BY MOUTH DAILY AS NEEDED FOR MIGRAINE. MAY REPEAT FOR ONE DOSE 2 HOURS AFTER THE FIRST ONE IF NEEDED. 10 tablet 0   sodium chloride (OCEAN) 0.65 % SOLN nasal spray Place 1 spray into both nostrils every 4 (four) hours as needed for congestion.      White Petrolatum-Mineral Oil (LUBRICANT EYE) OINT Place 1 application into both eyes 2 (two) times daily as needed (for dry eyes).      fluticasone (FLONASE) 50 MCG/ACT nasal spray Place into both nostrils daily as needed for allergies or rhinitis.     methylPREDNISolone (MEDROL DOSEPAK) 4 MG TBPK tablet Take as directed in the package, this is a taper pack 21 tablet 0   potassium chloride 20 MEQ/15ML (10%) SOLN Take 30 mLs (40 mEq total) by mouth daily  for 3 days. 90 mL 0   No facility-administered medications prior to visit.    Review of Systems  Review of Systems  Constitutional: Negative.   HENT:  Positive for postnasal drip.   Respiratory:  Positive for chest tightness. Negative for wheezing.     Physical Exam  BP 116/60 (BP Location: Left Arm, Patient Position: Sitting, Cuff Size: Normal)   Pulse 75   Temp (!) 97.5 F (36.4 C) (Oral)   Ht 5' 3.5" (1.613 m)   Wt 100 lb 12.8 oz (45.7 kg)   SpO2 98%   BMI 17.58 kg/m  Physical Exam Constitutional:      Appearance: Normal appearance.  HENT:     Head: Normocephalic and atraumatic.     Mouth/Throat:     Comments: Tracheostomy in place Cardiovascular:     Rate and Rhythm: Normal rate and regular rhythm.  Pulmonary:     Effort: Pulmonary effort is normal. No respiratory distress.     Breath sounds: No stridor. Wheezing present. No rhonchi or rales.  Skin:    General: Skin is warm and dry.  Neurological:     General: No focal deficit present.     Mental Status: She is alert and oriented to person, place, and time. Mental status is at baseline.     Lab Results:  CBC    Component Value Date/Time   WBC 5.8 10/27/2020 1139   RBC 4.39 10/27/2020 1139   HGB 14.4 10/27/2020 1139   HGB 12.6 02/17/2014 1303   HCT 42.3 10/27/2020 1139   HCT 37.9 02/17/2014 1303   PLT 196 10/27/2020 1139   PLT 208 02/17/2014 1303   MCV 96.4 10/27/2020 1139   MCV 97 02/17/2014 1303   MCH 32.8 10/27/2020 1139   MCHC 34.0 10/27/2020 1139   RDW 12.2 10/27/2020 1139   RDW 12.5 02/17/2014 1303   LYMPHSABS 1.3 10/27/2020 1139   LYMPHSABS 1.2 02/17/2014 1303   MONOABS 0.6 10/27/2020 1139   MONOABS 0.3 02/17/2014 1303   EOSABS 0.2 10/27/2020 1139   EOSABS 0.2 02/17/2014 1303   BASOSABS 0.0 10/27/2020 1139   BASOSABS 0.0 02/17/2014 1303  BMET    Component Value Date/Time   NA 141 12/13/2020 0908   K 3.4 (L) 12/13/2020 0908   K 3.6 04/03/2012 1653   CL 102 12/13/2020 0908   CO2  30 12/13/2020 0908   GLUCOSE 82 12/13/2020 0908   BUN 11 12/13/2020 0908   CREATININE 1.01 12/13/2020 0908   CREATININE 0.94 10/30/2016 1135   CALCIUM 9.4 12/13/2020 0908   GFRNONAA >60 10/27/2020 1139   GFRAA >60 05/07/2018 1327    BNP No results found for: BNP  ProBNP    Component Value Date/Time   PROBNP 76.0 06/22/2017 1514    Imaging: No results found.   Assessment & Plan:   Mild persistent asthma - Acute asthma exacerbation secondary to seasonal allergies. Not able to use Pulmicort nebulizer BID as prescribed d.t headache. Uses Ipratropium-albuterol nebulizer as needed only. She is compliant with Singulair and Zyrtec. She had wheezing on exam today. VSS, O2 98% RA.  Advised she start taking Duoneb TID and medrol dose pack as prescribed. Resume Astelin/fluticasion nasal spray daily. Unfortunately no other nebulizer choices for maintenance. She has multiple intolerances. Recommend checking eos and IgE to see if candidate for biologic at follow-up. FU in 3 months or sooner if needed.    Martyn Ehrich, NP 01/11/2021

## 2021-01-12 ENCOUNTER — Other Ambulatory Visit: Payer: Self-pay

## 2021-01-12 ENCOUNTER — Other Ambulatory Visit (INDEPENDENT_AMBULATORY_CARE_PROVIDER_SITE_OTHER): Payer: Medicare Other

## 2021-01-12 ENCOUNTER — Ambulatory Visit (INDEPENDENT_AMBULATORY_CARE_PROVIDER_SITE_OTHER): Payer: Medicare Other | Admitting: *Deleted

## 2021-01-12 DIAGNOSIS — E876 Hypokalemia: Secondary | ICD-10-CM | POA: Diagnosis not present

## 2021-01-12 DIAGNOSIS — E538 Deficiency of other specified B group vitamins: Secondary | ICD-10-CM | POA: Diagnosis not present

## 2021-01-12 LAB — BASIC METABOLIC PANEL
BUN: 18 mg/dL (ref 6–23)
CO2: 31 mEq/L (ref 19–32)
Calcium: 9.3 mg/dL (ref 8.4–10.5)
Chloride: 103 mEq/L (ref 96–112)
Creatinine, Ser: 1.14 mg/dL (ref 0.40–1.20)
GFR: 48.49 mL/min — ABNORMAL LOW (ref >60.00)
Glucose, Bld: 70 mg/dL (ref 70–99)
Potassium: 3.6 mEq/L (ref 3.5–5.1)
Sodium: 141 mEq/L (ref 135–145)

## 2021-01-12 LAB — POTASSIUM: Potassium: 3.6 mEq/L (ref 3.5–5.1)

## 2021-01-12 MED ORDER — CYANOCOBALAMIN 1000 MCG/ML IJ SOLN
1000.0000 ug | Freq: Once | INTRAMUSCULAR | Status: AC
  Administered 2021-01-12: 1000 ug via INTRAMUSCULAR

## 2021-01-12 NOTE — Progress Notes (Signed)
Patient presented for B 12 injection to right deltoid, patient voiced no concerns nor showed any signs of distress during injection. 

## 2021-01-14 NOTE — Progress Notes (Signed)
Agree with the details of the visit as noted by Elizabeth Walsh, NP.  C. Laura Edson Deridder, MD [facility] 

## 2021-01-17 ENCOUNTER — Other Ambulatory Visit: Payer: Self-pay | Admitting: Acute Care

## 2021-01-18 ENCOUNTER — Telehealth: Payer: Self-pay

## 2021-01-18 ENCOUNTER — Other Ambulatory Visit: Payer: Self-pay | Admitting: Family Medicine

## 2021-01-18 DIAGNOSIS — N179 Acute kidney failure, unspecified: Secondary | ICD-10-CM

## 2021-01-18 DIAGNOSIS — Z930 Tracheostomy status: Secondary | ICD-10-CM

## 2021-01-18 NOTE — Progress Notes (Signed)
I called the patient and informed her to stop the Nsaids and she stated she has and the provider sent in a medication and it helps her.  Patient is scheduled for labs to recheck her kidney function in 3 weeks.  Sayed Apostol,cma

## 2021-01-18 NOTE — Telephone Encounter (Signed)
-----   Message from Erick Colace, NP sent at 01/17/2021 12:26 PM EDT ----- Regarding: DME trach nneds Hey guys need the following for that patient:  Tracheostomy Home Health Order Sheet  [facility]:  Please order the following trach:  Shiley , size 4, uncuffed, and fenestrated  Backup trach  shiley, uncuffed, size 4, and fenestrated   Thanks Pete   _______________________________________________.

## 2021-01-18 NOTE — Telephone Encounter (Signed)
Called and spoke with patient. She stated that she has been using [employer] as her DME. Order was been placed.

## 2021-01-19 ENCOUNTER — Encounter: Payer: Self-pay | Admitting: Family Medicine

## 2021-01-19 ENCOUNTER — Telehealth: Payer: Self-pay | Admitting: Family Medicine

## 2021-01-19 NOTE — Telephone Encounter (Signed)
Patient informed, Due to the high volume of calls and your symptoms we have to forward your call to our Triage Nurse to expedient your call. Please hold for the transfer.  Patient transferred to Access Nurse. Due to possibly having shingles on her right buttock with blisters.No openings in office or virtual for the patient to be evaluated.

## 2021-01-20 ENCOUNTER — Telehealth: Payer: Self-pay

## 2021-01-20 ENCOUNTER — Other Ambulatory Visit: Payer: Self-pay

## 2021-01-20 ENCOUNTER — Encounter: Payer: Self-pay | Admitting: Internal Medicine

## 2021-01-20 ENCOUNTER — Ambulatory Visit (INDEPENDENT_AMBULATORY_CARE_PROVIDER_SITE_OTHER): Payer: Medicare Other | Admitting: Internal Medicine

## 2021-01-20 VITALS — BP 110/60 | HR 64 | Temp 98.1°F | Ht 63.5 in | Wt 100.4 lb

## 2021-01-20 DIAGNOSIS — L282 Other prurigo: Secondary | ICD-10-CM

## 2021-01-20 DIAGNOSIS — R21 Rash and other nonspecific skin eruption: Secondary | ICD-10-CM | POA: Diagnosis not present

## 2021-01-20 MED ORDER — BETAMETHASONE DIPROPIONATE 0.05 % EX CREA: TOPICAL_CREAM | Freq: Two times a day (BID) | CUTANEOUS | 1 refills | Status: DC

## 2021-01-20 MED ORDER — VALACYCLOVIR HCL 1 G PO TABS: 1000.0000 mg | ORAL_TABLET | Freq: Two times a day (BID) | ORAL | 0 refills | Status: DC

## 2021-01-20 NOTE — Telephone Encounter (Signed)
Noted  
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
## 2021-01-20 NOTE — Telephone Encounter (Signed)
See access nurse triage call. Pt is scheduled for today.

## 2021-01-21 NOTE — Telephone Encounter (Signed)
I called and spoke with the patient on yesterday and she is scheduled to see M. Arnette about her rash.  Ariv Penrod,cma

## 2021-01-21 NOTE — Telephone Encounter (Deleted)
I called.

## 2021-01-26 ENCOUNTER — Telehealth: Payer: Self-pay | Admitting: Cardiovascular Disease

## 2021-01-26 NOTE — Telephone Encounter (Signed)
   Name: Anne [name]  DOB: 1951-03-20  MRN: [id number]   Primary Cardiologist: Kathlyn Sacramento, MD  Chart reviewed as part of pre-operative protocol coverage. Patient was contacted 01/26/2021 in reference to pre-operative risk assessment for pending surgery as outlined below.  Anne [name] was last seen on 07/22/20 by Dr. Fletcher Anon.  Since that day, Anne [name] has done well. She had a CCTA 07/2020 with no CAD. She can complete 4.0 METS without angina. She takes plavix for hx of CVA and this is managed by her PCP.   Therefore, based on ACC/AHA guidelines, the patient would be at acceptable risk for the planned procedure without further cardiovascular testing.   The patient was advised that if she develops new symptoms prior to surgery to contact our office to arrange for a follow-up visit, and she verbalized understanding.  I will route this recommendation to the requesting party via Epic fax function and remove from pre-op pool. Please call with questions.  Ledora Bottcher, PA 01/26/2021, 4:29 PM

## 2021-01-26 NOTE — Telephone Encounter (Signed)
   [facility] Pre-operative Risk Assessment    Patient Name: Anne [name]  DOB: [birth year] MRN: [id number]  HEARTCARE STAFF:  - IMPORTANT!!!!!! Under Visit Info/Reason for Call, type in Other and utilize the format Clearance MM/DD/YY or Clearance TBD. Do not use dashes or single digits. - Please review there is not already an duplicate clearance open for this procedure. - If request is for dental extraction, please clarify the # of teeth to be extracted. - If the patient is currently at the dentist's office, call Pre-Op Callback Staff (MA/nurse) to input urgent request.  - If the patient is not currently in the dentist office, please route to the Pre-Op pool.  Request for surgical clearance:  What type of surgery is being performed? Renal cryoablation   When is this surgery scheduled? 02/07/21  What type of clearance is required (medical clearance vs. Pharmacy clearance to hold med vs. Both)? both  Are there any medications that need to be held prior to surgery and how long? Hold Plavix 5 days prior   Practice name and name of physician performing surgery? [facility] - Dr Ricky Stabs   What is the office phone number? [phone]   7.   What is the office fax number? [phone]  8.   Anesthesia type (None, local, MAC, general) ? Not listed   Ace Gins 01/26/2021, 2:11 PM  _________________________________________________________________   (provider comments below)

## 2021-01-31 ENCOUNTER — Telehealth: Payer: Self-pay | Admitting: Family Medicine

## 2021-01-31 NOTE — Telephone Encounter (Signed)
I called and spoke with the patient and informed her that her last dose of Plavix before her surgery  would be on 02/01/2021 and she understood.  Mahlon Gabrielle,cma

## 2021-01-31 NOTE — Telephone Encounter (Signed)
Fax received from [facility] vascular interventional radiology.  They are requesting to hold Plavix for 5 days prior to a renal cryoablation.  This is acceptable from my standpoint as the patient is on Plavix for history of TIA.  Letter created to be faxed.  Please let the patient know that her last dose of Plavix will be on 02/01/2021.

## 2021-02-08 ENCOUNTER — Other Ambulatory Visit: Payer: Medicare Other

## 2021-02-14 ENCOUNTER — Ambulatory Visit: Payer: Medicare Other

## 2021-02-14 ENCOUNTER — Ambulatory Visit (INDEPENDENT_AMBULATORY_CARE_PROVIDER_SITE_OTHER): Payer: Medicare Other

## 2021-02-14 ENCOUNTER — Other Ambulatory Visit: Payer: Self-pay

## 2021-02-14 DIAGNOSIS — E538 Deficiency of other specified B group vitamins: Secondary | ICD-10-CM | POA: Diagnosis not present

## 2021-02-14 DIAGNOSIS — N179 Acute kidney failure, unspecified: Secondary | ICD-10-CM

## 2021-02-14 DIAGNOSIS — Z23 Encounter for immunization: Secondary | ICD-10-CM

## 2021-02-14 MED ORDER — CYANOCOBALAMIN 1000 MCG/ML IJ SOLN
1000.0000 ug | Freq: Once | INTRAMUSCULAR | Status: AC
  Administered 2021-02-14: 1000 ug via INTRAMUSCULAR

## 2021-02-14 NOTE — Progress Notes (Signed)
Patient presented for B 12 injection to right deltoid, patient voiced no concerns nor showed any signs of distress during injection. 

## 2021-02-15 LAB — BASIC METABOLIC PANEL
BUN: 12 mg/dL (ref 6–23)
CO2: 31 mEq/L (ref 19–32)
Calcium: 8.9 mg/dL (ref 8.4–10.5)
Chloride: 104 mEq/L (ref 96–112)
Creatinine, Ser: 1.03 mg/dL (ref 0.40–1.20)
GFR: 54.73 mL/min — ABNORMAL LOW (ref >60.00)
Glucose, Bld: 93 mg/dL (ref 70–99)
Potassium: 3.8 mEq/L (ref 3.5–5.1)
Sodium: 142 mEq/L (ref 135–145)

## 2021-02-16 ENCOUNTER — Encounter: Payer: Self-pay | Admitting: Family Medicine

## 2021-02-21 ENCOUNTER — Ambulatory Visit: Payer: Medicare Other | Admitting: Family Medicine

## 2021-02-28 ENCOUNTER — Telehealth: Payer: Self-pay | Admitting: Pulmonary Disease

## 2021-02-28 NOTE — Telephone Encounter (Signed)
Spoke to patient, who reports of redness and pain around trach. Sx started 02/24/2021. Per Dr. Patsey Berthold verbally-- recommend that patient contact ENT or trach clinic.  Patient is aware and voiced her understanding. Nothing further is needed.

## 2021-03-02 ENCOUNTER — Telehealth: Payer: Self-pay | Admitting: Family Medicine

## 2021-03-02 NOTE — Telephone Encounter (Signed)
Pt called in requesting a copy of her flu shot. 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 Pt requesting callback when information is available.

## 2021-03-03 NOTE — Telephone Encounter (Signed)
I called and spoke with the patient and informed her that her flu of the flu vaccine was available and ready for pickup.  Halla Chopp,cma

## 2021-03-06 ENCOUNTER — Other Ambulatory Visit: Payer: Self-pay | Admitting: Family Medicine

## 2021-03-08 ENCOUNTER — Telehealth: Payer: Self-pay

## 2021-03-08 NOTE — Telephone Encounter (Signed)
Being it has been a year since the j & j and she has been okay is there a booster you recommend for her to take currently?   Also could we administer what ever one you suggest?

## 2021-03-08 NOTE — Telephone Encounter (Signed)
I would consider doing the Novavax COVID-19 vaccine. The J&J is really not being used any more. She had anaphylaxis to the components of the [employer] and Moderna vaccines. This Novavax might be her best best and we can skin test directly to the vaccine.   Caryl Pina - can you call your pharmacy contact at [facility] to see if this is an option?  Salvatore Marvel, MD [facility] [place]

## 2021-03-09 ENCOUNTER — Telehealth: Payer: Self-pay | Admitting: Acute Care

## 2021-03-09 ENCOUNTER — Other Ambulatory Visit: Payer: Self-pay | Admitting: Acute Care

## 2021-03-09 MED ORDER — NYSTATIN 100000 UNIT/ML MT SUSP: 5.0000 mL | Freq: Four times a day (QID) | OROMUCOSAL | 0 refills | Status: DC

## 2021-03-09 NOTE — Telephone Encounter (Signed)
Spoke with patient, informed her of Dr. Gillermina Hu recommendation. I also informed patient that we do not carrie the Novavax in the office however I was working with the pharmacy to see if we could get the vaccine in office to test and administer to patient. Patient verbalized understanding. Will call patient once I know more information to get her scheduled.

## 2021-03-11 ENCOUNTER — Encounter: Payer: Self-pay | Admitting: Family Medicine

## 2021-03-11 ENCOUNTER — Ambulatory Visit (INDEPENDENT_AMBULATORY_CARE_PROVIDER_SITE_OTHER): Payer: Medicare Other | Admitting: Family Medicine

## 2021-03-11 ENCOUNTER — Other Ambulatory Visit: Payer: Self-pay

## 2021-03-11 VITALS — BP 110/60 | HR 68 | Temp 97.9°F | Ht 63.0 in | Wt 102.0 lb

## 2021-03-11 DIAGNOSIS — F439 Reaction to severe stress, unspecified: Secondary | ICD-10-CM

## 2021-03-11 DIAGNOSIS — Z23 Encounter for immunization: Secondary | ICD-10-CM

## 2021-03-11 DIAGNOSIS — K220 Achalasia of cardia: Secondary | ICD-10-CM

## 2021-03-11 DIAGNOSIS — I209 Angina pectoris, unspecified: Secondary | ICD-10-CM | POA: Diagnosis not present

## 2021-03-11 DIAGNOSIS — J8410 Pulmonary fibrosis, unspecified: Secondary | ICD-10-CM | POA: Diagnosis not present

## 2021-03-11 DIAGNOSIS — E785 Hyperlipidemia, unspecified: Secondary | ICD-10-CM

## 2021-03-11 DIAGNOSIS — N289 Disorder of kidney and ureter, unspecified: Secondary | ICD-10-CM | POA: Diagnosis not present

## 2021-03-11 DIAGNOSIS — J4530 Mild persistent asthma, uncomplicated: Secondary | ICD-10-CM | POA: Diagnosis not present

## 2021-03-11 DIAGNOSIS — Z8639 Personal history of other endocrine, nutritional and metabolic disease: Secondary | ICD-10-CM

## 2021-03-11 HISTORY — DX: Reaction to severe stress, unspecified: F43.9

## 2021-03-11 NOTE — Assessment & Plan Note (Signed)
She will schedule follow-up with GI.  She will continue Carafate and Protonix.

## 2021-03-11 NOTE — Assessment & Plan Note (Signed)
She will monitor for any worsening or development of depression.

## 2021-03-11 NOTE — Progress Notes (Signed)
Tommi Rumps, MD Phone: [phone]  Anne [name] is a 70 y.o. female who presents today for f/u.  Pulmonary fibrosis/asthma: Patient saw her ENT recently and was prescribed a Z-Pak.  She notes her cough has improved.  She typically has some increased shortness of breath this time of year.  She notes no wheezing.  She does follow with pulmonology.  She is on Singulair and Zyrtec.  She is not on any maintenance inhaler treatments.  Achalasia: She continues on Protonix and Carafate.  She notes her swallowing is okay.  She is due for follow-up with GI.  Renal lesion: This is presumed to be renal cell carcinoma.  She had cryoablation recently and they could not do a biopsy given her anatomy.  She has had some nerve irritation symptoms since then and she is currently taking gabapentin 300 mg twice daily.  She notes some left shoulder discomfort related to this nerve irritation.  She is intermittently taking ibuprofen and Tylenol.  She continues to follow with urology and interventional radiology.  Stress: Patient had a lot of things happen over the last several months that induced stress.  Both of her dogs had to be put down.  Her husband was diagnosed with bladder cancer and has undergone treatment for that.  She notes no depression.  Social History   Tobacco Use  Smoking Status Never  Smokeless Tobacco Never    Current Outpatient Medications on File Prior to Visit  Medication Sig Dispense Refill   acetaminophen (TYLENOL) 500 MG tablet Take 1,000 mg by mouth 2 (two) times daily as needed for moderate pain or headache.     albuterol (VENTOLIN HFA) 108 (90 Base) MCG/ACT inhaler Inhale 2 puffs into the lungs every 6 (six) hours as needed for wheezing or shortness of breath. 18 g 2   azelastine (ASTELIN) 0.1 % nasal spray Place 1 spray into both nostrils 2 (two) times daily. Use in each nostril as directed 30 mL 6   betamethasone dipropionate 0.05 % cream Apply topically 2 (two) times  daily. Prn 45 g 1   budesonide (PULMICORT) 0.25 MG/2ML nebulizer solution Take 2 mLs (0.25 mg total) by nebulization 2 (two) times daily. Use after DuoNeb 120 mL 11   cetirizine (ZYRTEC) 10 MG tablet TAKE 1 TABLET BY MOUTH EVERY DAY (NOT COVERED 90 tablet 0   clopidogrel (PLAVIX) 75 MG tablet TAKE 1 TABLET BY MOUTH EVERY DAY (Patient taking differently: Take 75 mg by mouth in the morning.) 90 tablet 1   diphenhydrAMINE (BENADRYL) 25 MG tablet Take 25 mg by mouth daily as needed for allergies.      doxycycline (VIBRA-TABS) 100 MG tablet Take 1 tablet (100 mg total) by mouth 2 (two) times daily. 14 tablet 0   EPINEPHrine 0.3 mg/0.3 mL IJ SOAJ injection Inject 0.3 mg into the muscle as needed for anaphylaxis.      estradiol (ESTRACE) 1 MG tablet TAKE 1 TABLET BY MOUTH EVERY DAY 90 tablet 1   fluticasone (FLONASE) 50 MCG/ACT nasal spray Place 1 spray into both nostrils daily. 16 g 3   furosemide (LASIX) 20 MG tablet TAKE 1 TABLET BY MOUTH EVERY DAY AS NEEDED 90 tablet 1   gabapentin (NEURONTIN) 300 MG capsule Take 300 mg by mouth 3 (three) times daily as needed (pain).     guaiFENesin-codeine 100-10 MG/5ML syrup Take 5 mLs by mouth 2 (two) times daily as needed for cough. 120 mL 0   HYDROcodone-acetaminophen (NORCO/VICODIN) 5-325 MG tablet Take 1 tablet  by mouth 2 (two) times daily as needed for severe pain.     HYDROcodone-acetaminophen (NORCO/VICODIN) 5-325 MG tablet Take 1 tablet by mouth every 4 (four) hours as needed for moderate pain. 20 tablet 0   HYDROcodone-homatropine (HYCODAN) 5-1.5 MG/5ML syrup Take 5 mLs by mouth every 8 (eight) hours as needed for cough. 120 mL 0   ibuprofen (ADVIL,MOTRIN) 200 MG tablet Take 400-800 mg by mouth daily as needed for headache or moderate pain.     ipratropium-albuterol (DUONEB) 0.5-2.5 (3) MG/3ML SOLN Take 3 mLs by nebulization every 4 (four) hours as needed. Dx 496 120 mL 2   ketoconazole (NIZORAL) 2 % shampoo Apply 1 application topically 2 (two) times a  week. 120 mL 0   methocarbamol (ROBAXIN) 500 MG tablet Take 1 tablet (500 mg total) by mouth 4 (four) times daily. 45 tablet 0   methylPREDNISolone (MEDROL DOSEPAK) 4 MG TBPK tablet Take as directed in the package, this is a taper pack 21 tablet 0   metoprolol succinate (TOPROL-XL) 25 MG 24 hr tablet TAKE 1/2 TABLET BY MOUTH EVERY DAY (Patient taking differently: Take 12.5 mg by mouth daily.) 45 tablet 3   montelukast (SINGULAIR) 10 MG tablet TAKE 1 TABLET BY MOUTH EVERYDAY AT BEDTIME (Patient taking differently: Take 10 mg by mouth at bedtime.) 90 tablet 1   mupirocin ointment (BACTROBAN) 2 % Apply 1 application topically daily as needed (FOR [facility] SITE IRRITATION).     Naphazoline-Pheniramine 0.027-0.315 % SOLN Place 1 drop into both eyes daily as needed (allergies).      nystatin (MYCOSTATIN) 100000 UNIT/ML suspension Take 5 mLs (500,000 Units total) by mouth 4 (four) times daily for 10 days. 200 mL 0   pantoprazole (PROTONIX) 40 MG tablet TAKE ONE TABLET EVERY DAY NEEDS FOR OFFICE USE VISIT FOR MORE REFILLS. 90 tablet 2   potassium chloride (KLOR-CON) 10 MEQ tablet Take 10 mEq by mouth daily as needed (low potassium).     rizatriptan (MAXALT-MLT) 10 MG disintegrating tablet TAKE 1 TABLET (10 MG TOTAL) BY MOUTH DAILY AS NEEDED FOR MIGRAINE. MAY REPEAT FOR ONE DOSE 2 HOURS AFTER THE FIRST ONE IF NEEDED. 10 tablet 0   sodium chloride (OCEAN) 0.65 % SOLN nasal spray Place 1 spray into both nostrils every 4 (four) hours as needed for congestion.      valACYclovir (VALTREX) 1000 MG tablet Take 1 tablet (1,000 mg total) by mouth 2 (two) times daily. With food 14 tablet 0   White Petrolatum-Mineral Oil (LUBRICANT EYE) OINT Place 1 application into both eyes 2 (two) times daily as needed (for dry eyes).      potassium chloride 20 MEQ/15ML (10%) SOLN Take 30 mLs (40 mEq total) by mouth daily for 3 days. 90 mL 0   [DISCONTINUED] zolmitriptan (ZOMIG-ZMT) 5 MG disintegrating tablet Take 1 tablet (5 mg total)  by mouth daily as needed for migraine. 10 tablet 0   No current facility-administered medications on file prior to visit.     ROS see history of present illness  Objective  Physical Exam Vitals:   03/11/21 0908  BP: 110/60  Pulse: 68  Temp: 97.9 F (36.6 C)  SpO2: 97%    BP Readings from Last 3 Encounters:  03/11/21 110/60  01/20/21 110/60  01/10/21 116/60   Wt Readings from Last 3 Encounters:  03/11/21 102 lb (46.3 kg)  01/20/21 100 lb 6.4 oz (45.5 kg)  01/10/21 100 lb 12.8 oz (45.7 kg)    Physical Exam Constitutional:  General: She is not in acute distress.    Appearance: She is not diaphoretic.  Cardiovascular:     Rate and Rhythm: Normal rate and regular rhythm.     Heart sounds: Normal heart sounds.  Pulmonary:     Effort: Pulmonary effort is normal.     Breath sounds: Normal breath sounds.  Skin:    General: Skin is warm and dry.  Neurological:     Mental Status: She is alert.     Assessment/Plan: Please see individual problem list.  Problem List Items Addressed This Visit     Pulmonary fibrosis ([place]) - Primary (Chronic)    Generally stable.  She recently completed a Z-Pak and her lung sounds are clear today.  She will continue to see pulmonology.      Achalasia of esophagus    She will schedule follow-up with GI.  She will continue Carafate and Protonix.      HLD (hyperlipidemia)   Relevant Orders   Comp Met (CMET)   Lipid panel   Kidney lesion    She will continue to follow with interventional radiology and urology.      Mild persistent asthma    Generally stable at this time.  She will continue Zyrtec and Claritin.  She will discuss maintenance options with her pulmonologist in the future.      Stress    She will monitor for any worsening or development of depression.      Other Visit Diagnoses     History of iron deficiency       Relevant Orders   CBC   IBC + Ferritin        Health Maintenance: encouraged to get the  shingrix vaccine at the pharmacy in the new year. Pneumovax 23 given today.   Return in about 6 months (around 09/09/2021) for f/u, labs 2 days prior.  This visit occurred during the SARS-CoV-2 public health emergency.  Safety protocols were in place, including screening questions prior to the visit, additional usage of staff PPE, and extensive cleaning of exam room while observing appropriate contact time as indicated for disinfecting solutions.    Tommi Rumps, MD [place]

## 2021-03-11 NOTE — Addendum Note (Signed)
Addended by: Fulton Mole D on: 03/11/2021 10:58 AM   Modules accepted: Orders

## 2021-03-11 NOTE — Assessment & Plan Note (Signed)
" >>  ASSESSMENT AND PLAN FOR PULMONARY FIBROSIS ([facility]) WRITTEN ON 03/11/2021  9:33 AM BY Malijah Lietz G, MD  Generally stable.  She recently completed a Z-Pak and her lung sounds are clear today.  She will continue to see pulmonology. "

## 2021-03-11 NOTE — Assessment & Plan Note (Signed)
She will continue to follow with interventional radiology and urology.

## 2021-03-11 NOTE — Assessment & Plan Note (Addendum)
Generally stable.  She recently completed a Z-Pak and her lung sounds are clear today.  She will continue to see pulmonology.

## 2021-03-11 NOTE — Patient Instructions (Signed)
Nice to see you. I would encourage you to get the Shingrix vaccine at the pharmacy.  This should be covered by Medicare in the new year. Please contact GI to schedule follow-up.

## 2021-03-11 NOTE — Assessment & Plan Note (Signed)
Generally stable at this time.  She will continue Zyrtec and Claritin.  She will discuss maintenance options with her pulmonologist in the future.

## 2021-03-15 ENCOUNTER — Telehealth: Payer: Self-pay | Admitting: Acute Care

## 2021-03-16 NOTE — Telephone Encounter (Signed)
I have reached out to [facility] to see about getting Novavax in office. Waiting to hear back.

## 2021-03-17 ENCOUNTER — Ambulatory Visit: Payer: Medicare Other

## 2021-03-17 NOTE — Telephone Encounter (Signed)
Thank you for doing so much for our patients!   Let's skin prick to the vaccine, then given 25% and wait 30 minutes. Then give the remaining 75% and watch for one hour.   Salvatore Marvel, MD [facility] [place]

## 2021-03-17 NOTE — Telephone Encounter (Signed)
Anne [name] informed me that she will send two vials of the Novavax today 03/17/2021. Once received we will call patient to schedule an appointment.

## 2021-03-22 ENCOUNTER — Other Ambulatory Visit: Payer: Self-pay

## 2021-03-22 ENCOUNTER — Ambulatory Visit (INDEPENDENT_AMBULATORY_CARE_PROVIDER_SITE_OTHER): Payer: Medicare Other

## 2021-03-22 DIAGNOSIS — E538 Deficiency of other specified B group vitamins: Secondary | ICD-10-CM

## 2021-03-22 MED ORDER — CYANOCOBALAMIN 1000 MCG/ML IJ SOLN
1000.0000 ug | Freq: Once | INTRAMUSCULAR | Status: AC
  Administered 2021-03-22: 09:00:00 1000 ug via INTRAMUSCULAR

## 2021-03-22 NOTE — Progress Notes (Signed)
Patient came in today for B-12 injection given in left deltoid IM. Pt tolerated well with no signs of distress.

## 2021-03-28 NOTE — Telephone Encounter (Signed)
Spoke with patient and have schedule her challenge.

## 2021-04-05 ENCOUNTER — Other Ambulatory Visit: Payer: Self-pay | Admitting: Family Medicine

## 2021-04-06 ENCOUNTER — Other Ambulatory Visit: Payer: Self-pay | Admitting: Family Medicine

## 2021-04-07 ENCOUNTER — Other Ambulatory Visit: Payer: Self-pay | Admitting: Family Medicine

## 2021-04-12 ENCOUNTER — Ambulatory Visit (INDEPENDENT_AMBULATORY_CARE_PROVIDER_SITE_OTHER): Payer: Medicare Other | Admitting: Pulmonary Disease

## 2021-04-12 ENCOUNTER — Encounter: Payer: Self-pay | Admitting: Pulmonary Disease

## 2021-04-12 ENCOUNTER — Other Ambulatory Visit: Payer: Self-pay

## 2021-04-12 VITALS — BP 100/70 | HR 73 | Temp 97.5°F | Ht 63.5 in | Wt 99.2 lb

## 2021-04-12 DIAGNOSIS — J84112 Idiopathic pulmonary fibrosis: Secondary | ICD-10-CM

## 2021-04-12 DIAGNOSIS — J4540 Moderate persistent asthma, uncomplicated: Secondary | ICD-10-CM | POA: Diagnosis not present

## 2021-04-12 DIAGNOSIS — J398 Other specified diseases of upper respiratory tract: Secondary | ICD-10-CM

## 2021-04-12 MED ORDER — FORMOTEROL FUMARATE 20 MCG/2ML IN NEBU: 20.0000 ug | INHALATION_SOLUTION | Freq: Two times a day (BID) | RESPIRATORY_TRACT | 0 refills | Status: DC

## 2021-04-12 NOTE — Progress Notes (Signed)
Subjective:    Patient ID: Anne [name], female    DOB: [birth year], 71 y.o.   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 No frank honeycombing. Findings have progressed since  2014. 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 Chest x-ray 05/14/2020: Severe pleural-parenchymal scarring unchanged from prior tracheostomy tube in good position. Alpha-1 antitrypsin 06/14/2020: Normal level at 105 (she has known PI MZ). 2D echo 07/07/2020: LVEF 55 to 60%.  No wall motion abnormalities, grade II DD, aortic regurgitation mild to moderate. Coronary CT 08/12/2020: Coronary calcium score 0.   INTERVAL HISTORY: Anne [name] 07/23/2020 by our [facility] in [place]. Last visit with me on 10/13/2020, seen by Derl Barrow, NP in the interim 01/10/2021.  Recent evaluation by Dr.Juengel , ENT tracheal granulation tissue noted.  HPI This is a 71 year old lifelong never smoker with a history of mild to moderate persistent asthma and idiopathic pleural-parenchymal fibroelastosis who presents for follow-up.  She has a very complex history prior tracheostomy due to recurrent upper airway obstruction by history query VCD versus laryngeal dystonia.  She continues to have issues with her tracheostomy more than anything.  Difficulties with finding a proper fitting tracheostomy.  She also has difficulty tolerating Pulmicort.  Uses DuoNeb only as needed.  She is compliant with Singulair.  Have issues with shortness of  breath frequently and wheezing.  Feels that this is gradually getting worse.  Albuterol or DuoNeb does help some of her shortness of breath but not for a long time.  Today she presents stating that her breathing is about the same her main difficulties are as noted above with her tracheostomy.  She recently saw Dr. Kathyrn Sheriff (ENT) and was told she had granulation tissue at the trach site.  Does not endorse any sputum production.  No hemoptysis, no bleeding through the trach.  Recently she has had diagnosis of a left renal mass and had cryoablation for the same on 14 November at [facility].  She tolerated the procedure well but had some pain and discomfort afterwards there is some sleepless nights and some fatigue but she has now gotten over this.  The mass was in close proximity to her diaphragm and this is what likely caused her increased pain.   Review of Systems A 10 point review of systems was performed and it is as noted above otherwise negative.  Patient Active Problem List   Diagnosis Date Noted   Stress 03/11/2021   Neuropathy 08/17/2020   Scalp lesion 08/17/2020   History of COVID-19 04/19/2020   Ganglion cyst of wrist, right 04/19/2020   Nontraumatic nail splitting 04/19/2020   History of anaphylaxis 04/18/2019   Liver lesion 10/11/2018   S/P lumbar laminectomy 05/13/2018   Low back pain 02/07/2018   Iron deficiency anemia 12/11/2017   Mild persistent asthma 10/10/2017   Palpitations 08/03/2017   Bilateral leg edema 08/03/2017   Anxiety 08/03/2017   Current long-term use of postmenopausal hormone replacement therapy 03/19/2017   S/P cervical spinal fusion 01/10/2017   Kidney lesion 12/13/2016   Migraine 09/12/2016   Esophageal dysphagia 06/19/2016   Skin nodule 01/10/2016   Hypokalemia 01/10/2016  Dysuria 12/15/2015   Degenerative arthritis of cervical spine 10/28/2015   Gastritis    B12 deficiency 03/15/2015   Abdominal bloating 09/03/2014   Slow transit constipation  09/03/2014   Menopausal symptom 09/03/2014   Vocal cord dysfunction 07/15/2014   History of TIA (transient ischemic attack) 06/03/2014   Chronic pain syndrome 04/28/2014   Fatigue 09/30/2013   Hypertensive lower esophageal sphincter 07/29/2013   Achalasia of esophagus 07/29/2013   Edema 07/01/2013   Chronic pulmonary aspiration 06/06/2013   Insomnia 12/13/2012   HLD (hyperlipidemia) 12/13/2012   Anemia 08/13/2012   Pulmonary fibrosis ([place]) 08/05/2012   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   Allergies  Allergen Reactions   Asa [Aspirin] Shortness Of Breath, Swelling and Other (See Comments)    Tongue swells   Bee Venom Anaphylaxis and Shortness Of Breath   Epinephrine Shortness Of Breath and Palpitations    Occurs when pt is given double the dose, pt would use epipen as needed    Feraheme [Ferumoxytol] Shortness Of Breath    Light headed    Influenza Vaccines Shortness Of Breath and Other (See Comments)    Can take flu vaccines without eggs    Pfizer-Biontech Covid-19 Vacc [Covid-19 Mrna Vacc (Moderna)] Anaphylaxis   Quinolones Swelling and Other (See Comments)    Feet swell   Shellfish Allergy Anaphylaxis   Ciprofloxacin Swelling and Other (See Comments)    ALL MEDS ENDING IN -FLOXACIN MAKE THE FEET SWELL   Clarithromycin Nausea Only    Abdomina pain   Erythromycin Nausea Only    Abdominal pain   Levaquin [Levofloxacin In D5w] Swelling and Other (See Comments)    Feet and legs ache and SWELL   Peanut-Containing Drug Products Other (See Comments)    Wheezing (boiled or raw peanuts)   Septra [Sulfamethoxazole-Trimethoprim] Diarrhea   Telithromycin Nausea Only    Abdominal pain   Zanaflex [Tizanidine] Other (See Comments)    Caused the patient to feel "spaced out" and "not well"    Adhesive [Tape] Rash and Other (See Comments)    Paper works tape works fine    Current Meds  Medication Sig   acetaminophen (TYLENOL) 500 MG tablet Take 1,000 mg by mouth 2 (two) times daily as needed for moderate pain or headache.   albuterol (VENTOLIN HFA) 108 (90 Base) MCG/ACT inhaler Inhale 2 puffs into the lungs every 6 (six) hours as needed for wheezing or shortness of breath.   azelastine (ASTELIN) 0.1 % nasal spray Place 1 spray into both nostrils 2 (two) times daily. Use in each nostril as directed   budesonide (PULMICORT) 0.25 MG/2ML nebulizer solution Take 2 mLs (0.25 mg total) by nebulization 2 (two) times daily. Use after DuoNeb   cetirizine (ZYRTEC) 10 MG tablet TAKE 1 TABLET BY MOUTH EVERY DAY (NOT COVERED   clopidogrel (PLAVIX) 75 MG tablet Take 1 tablet (75 mg total) by mouth in the morning.   diphenhydrAMINE (BENADRYL) 25 MG tablet Take 25 mg by mouth daily as needed for allergies.    doxycycline (VIBRA-TABS) 100 MG tablet Take 1 tablet (100 mg total) by mouth 2 (two) times daily.   EPINEPHrine 0.3 mg/0.3 mL IJ SOAJ injection Inject 0.3 mg into the muscle as needed for anaphylaxis.    estradiol (ESTRACE) 1 MG  tablet TAKE 1 TABLET BY MOUTH EVERY DAY   fluticasone (FLONASE) 50 MCG/ACT nasal spray Place 1 spray into both nostrils daily.   furosemide (LASIX) 20 MG tablet TAKE 1 TABLET BY MOUTH EVERY DAY AS NEEDED   gabapentin (NEURONTIN) 300 MG capsule Take 300 mg by mouth 3 (three) times daily as needed (pain).   guaiFENesin-codeine 100-10 MG/5ML syrup Take 5 mLs by mouth 2 (two) times daily as needed for cough.   HYDROcodone-homatropine (HYCODAN) 5-1.5 MG/5ML syrup Take 5 mLs by mouth every 8 (eight) hours as needed for cough.   ibuprofen (ADVIL,MOTRIN) 200 MG tablet Take 400-800 mg by mouth daily as needed for headache or moderate pain.   ipratropium-albuterol (DUONEB) 0.5-2.5 (3) MG/3ML SOLN Take 3 mLs by nebulization every 4 (four) hours as needed. Dx 496   ketoconazole  (NIZORAL) 2 % shampoo Apply 1 application topically 2 (two) times a week.   methocarbamol (ROBAXIN) 500 MG tablet Take 1 tablet (500 mg total) by mouth 4 (four) times daily.   metoprolol succinate (TOPROL-XL) 25 MG 24 hr tablet TAKE 1/2 TABLET BY MOUTH EVERY DAY (Patient taking differently: Take 12.5 mg by mouth daily.)   montelukast (SINGULAIR) 10 MG tablet TAKE 1 TABLET BY MOUTH EVERYDAY AT BEDTIME   mupirocin ointment (BACTROBAN) 2 % Apply 1 application topically daily as needed (FOR [facility] SITE IRRITATION).   Naphazoline-Pheniramine 0.027-0.315 % SOLN Place 1 drop into both eyes daily as needed (allergies).    pantoprazole (PROTONIX) 40 MG tablet TAKE ONE TABLET EVERY DAY NEEDS FOR OFFICE USE VISIT FOR MORE REFILLS.   potassium chloride (KLOR-CON) 10 MEQ tablet Take 10 mEq by mouth daily as needed (low potassium).   potassium chloride 20 MEQ/15ML (10%) SOLN Take 30 mLs (40 mEq total) by mouth daily for 3 days.   rizatriptan (MAXALT-MLT) 10 MG disintegrating tablet TAKE 1 TABLET (10 MG TOTAL) BY MOUTH DAILY AS NEEDED FOR MIGRAINE. MAY REPEAT FOR ONE DOSE 2 HOURS AFTER THE FIRST ONE IF NEEDED.   sodium chloride (OCEAN) 0.65 % SOLN nasal spray Place 1 spray into both nostrils every 4 (four) hours as needed for congestion.    White Petrolatum-Mineral Oil (LUBRICANT EYE) OINT Place 1 application into both eyes 2 (two) times daily as needed (for dry eyes).    Immunization History  Administered Date(s) Administered   Fluad Quad(high Dose 65+) 02/14/2021   Influenza Inj Mdck Quad Pf 01/15/2019, 01/16/2020   Influenza, Quadrivalent, Recombinant, Inj, Pf 02/06/2017, 12/19/2017   Influenza,inj,Quad PF,6+ Mos 12/19/2012   Influenza,trivalent, recombinat, inj, PF 01/05/2014   Janssen (J&J) SARS-COV-2 Vaccination 01/05/2020, 03/10/2020   PFIZER(Purple Top)SARS-COV-2 Vaccination 05/09/2019   Pneumococcal Conjugate-13 01/23/2014   Pneumococcal Polysaccharide-23 03/11/2021       Objective:   Physical  Exam BP 100/70 (BP Location: Left Arm, Patient Position: Sitting, Cuff Size: Normal)    Pulse 73    Temp (!) 97.5 F (36.4 C) (Oral)    Ht 5' 3.5" (1.613 m)    Wt 99 lb 3.2 oz (45 kg)    SpO2 97%    BMI 17.30 kg/m  GENERAL: Well-developed, thin but not cachectic woman, in no acute distress.  She is fully ambulatory. HEAD: Normocephalic, atraumatic. EYES: Pupils equal, round, reactive to light.  No scleral icterus.  She has a bruise over the left eye (states she ran onto a table edge). MOUTH: Nose/mouth/throat not examined due to masking requirements for COVID 19. NECK: Supple.Trachea midline. No JVD.  No adenopathy.  Tracheostomy tube in place.  PULMONARY: Good air entry bilaterally.    Coarse breath sounds, no wheezes, no other adventitious sounds.   CARDIOVASCULAR: S1 and S2. Regular rate and rhythm.  No rubs, murmurs or gallops heard. ABDOMEN: Benign. MUSCULOSKELETAL: No joint deformity, no clubbing, no edema. NEUROLOGIC: No focal deficit noted.  No gait disturbance.  Phonates well with tracheostomy with speech valve. SKIN: Limited exam shows no rashes. PSYCH: Mood and behavior normal.      Assessment & Plan:     ICD-10-CM   1. Idiopathic pleuroparenchymal fibroelastosis [facility])  J84.112 CT Chest High Resolution   High-res CT to reevaluate     2. Moderate persistent allergic asthma  J45.40    Unable to tolerate many inhalational medications Does not tolerate budesonide Using DuoNeb as needed, Singulair daily    3. Tracheal granulation  J39.8 Ambulatory referral to ENT   Patient wants second ENT opinion Will refer to Dr. Beverely Risen, [facility] ENT     Orders Placed This Encounter  Procedures   CT Chest High Resolution    Standing Status:   Future    Number of Occurrences:   1    Standing Expiration Date:   04/12/2022    Order Specific Question:   Preferred imaging location?    Answer:   [place]   Ambulatory referral to ENT    Referral Priority:   Routine    Referral Type:    Consultation    Referral Reason:   Specialty Services Required    Referred to Provider:   Irene Limbo, MD    Requested Specialty:   Otolaryngology    Number of Visits Requested:   1   From the pulmonary standpoint Anne [name] is doing well.  Has had issues with her tracheostomy and development of tracheal granulation tissue.  On exam there is no suggestion of tracheal stenosis.  She does have issues with idiopathic pleural-parenchymal fibroelastosis and has not been imaged to assess for potential progression.  We will proceed with high-res CT to reevaluate this issue.  With regards to her asthma we will try her on Perforomist via nebulizer to see if this helps her.  She can still use DuoNeb as needed for rescue.  The patient continues to have issues with ill fitting tracheostomies and now with granulation tissue.  She would like a second opinion in this regard.  We will refer her to Dr. Unice Bailey at [facility] for evaluation.  I thank Dr. Beverely Risen in advance for his input.  We will see the patient in follow-up in 6 to 8 weeks time she is to contact us prior to that time should any new difficulties arise.  Renold Don, MD Advanced Bronchoscopy PCCM [facility]    *This note was dictated using voice recognition software/Dragon.  Despite best efforts to proofread, errors can occur which can change the meaning. Any transcriptional errors that result from this process are unintentional and may not be fully corrected at the time of dictation.

## 2021-04-12 NOTE — Patient Instructions (Signed)
We are giving you a sample of the medication called Perforomist that you will use twice a day via nebulizer.  You may still use some of the DuoNeb only as needed in between times.  I suspect however that the Perforomist will keep your shortness of breath in better control.  We are referring you to Dr. Beverely Risen at [facility] for evaluation of the granulation tissue in your trachea.  We are repeating a CT chest high-resolution to evaluate the issues with pulmonary fibrosis that you have had.  We will see him in follow-up in 6 to 8 weeks time call sooner should any new problems arise.

## 2021-04-18 ENCOUNTER — Other Ambulatory Visit: Payer: Self-pay | Admitting: Internal Medicine

## 2021-04-18 ENCOUNTER — Telehealth: Payer: Self-pay | Admitting: Internal Medicine

## 2021-04-18 NOTE — Telephone Encounter (Signed)
Inbound call from patient requesting medication refill for Metoclopramid-E 5mg  sent to [facility] on [address]. Patient have a virtual appt 1/31 with APP

## 2021-04-19 NOTE — Telephone Encounter (Signed)
yes

## 2021-04-19 NOTE — Telephone Encounter (Signed)
Dr Hilarie FredricksonFaythe Ghee to refill metoclopramide until patient's virtual appointment 04-26-21? Looks like she was given #60 tabs on 07-14-19 and hasnt been on the med since.

## 2021-04-19 NOTE — Telephone Encounter (Signed)
Rx sent 

## 2021-04-25 ENCOUNTER — Ambulatory Visit: Payer: Medicare Other

## 2021-04-26 ENCOUNTER — Telehealth: Payer: Self-pay | Admitting: Physician Assistant

## 2021-04-26 ENCOUNTER — Telehealth (INDEPENDENT_AMBULATORY_CARE_PROVIDER_SITE_OTHER): Payer: Medicare Other | Admitting: Physician Assistant

## 2021-04-26 ENCOUNTER — Encounter: Payer: Self-pay | Admitting: Physician Assistant

## 2021-04-26 VITALS — Ht 63.5 in | Wt 99.0 lb

## 2021-04-26 DIAGNOSIS — Z7901 Long term (current) use of anticoagulants: Secondary | ICD-10-CM | POA: Diagnosis not present

## 2021-04-26 DIAGNOSIS — K219 Gastro-esophageal reflux disease without esophagitis: Secondary | ICD-10-CM | POA: Diagnosis not present

## 2021-04-26 DIAGNOSIS — R140 Abdominal distension (gaseous): Secondary | ICD-10-CM | POA: Diagnosis not present

## 2021-04-26 DIAGNOSIS — K224 Dyskinesia of esophagus: Secondary | ICD-10-CM | POA: Diagnosis not present

## 2021-04-26 MED ORDER — METOCLOPRAMIDE HCL 5 MG PO TABS: 5.0000 mg | ORAL_TABLET | Freq: Three times a day (TID) | ORAL | 11 refills | Status: DC | PRN

## 2021-04-26 NOTE — Telephone Encounter (Signed)
April 26, 2021 Levin Erp, [place] to Me    2:06 PM I am ok with it, but patient may feel differently, please call and give her the option.   Thanks,  JLL  Me to Levin Erp, [place]    1:50 PM Dr Hilarie Fredrickson does not have any availbility for hospital case in the near future. I am currently awaiting an April schedule, maybe even May. Do you want me to schedule with someone who has a closer appt date?  Levin Erp, [place] to Me    1:07 PM Carla Drape,     I saw this patient virtually today.  She needs to be scheduled for repeat EGD with Botox injection with Dr. Hilarie Fredrickson at the hospital.  I have already received clearance for her Plavix which she is to hold for 5 days.   Thanks for arranging.  Ellouise Newer, PA-C

## 2021-04-26 NOTE — Telephone Encounter (Signed)
I have spoken to patient who prefers to wait until an availability with Dr Hilarie Fredrickson occurs. She is advised that it may be April or May 2023 before we are able to get an appointment to which she is in agreement. She states she will call us back should she need to schedule the procedure sooner with another provider.

## 2021-04-26 NOTE — Telephone Encounter (Signed)
° °  Anne [name] 08-Jun-1950 [id number]  Dear Dr. [name]:  We want to schedule the above named patient for a(n) EGD with Botox procedure. Our records show that she is on anticoagulation therapy with Plavix.  Please advise as to whether the patient may come off their Plavix for 5 days prior to their procedure which is yet to be scheduled, pending your response.  Please route your response to Ellouise Newer, PA-C or fax response to [phone].  Sincerely, Ellouise Newer, PA-C  [facility]

## 2021-04-26 NOTE — Telephone Encounter (Signed)
Ms. amariyana, heacox are scheduled for a virtual visit with your provider today.    Just as we do with appointments in the office, we must obtain your consent to participate.  Your consent will be active for this visit and any virtual visit you may have with one of our providers in the next 365 days.    If you have a MyChart account, I can also send a copy of this consent to you electronically.  All virtual visits are billed to your insurance company just like a traditional visit in the office.  As this is a virtual visit, video technology does not allow for your provider to perform a traditional examination.  This may limit your provider's ability to fully assess your condition.  If your provider identifies any concerns that need to be evaluated in person or the need to arrange testing such as labs, EKG, etc, we will make arrangements to do so.    Although advances in technology are sophisticated, we cannot ensure that it will always work on either your end or our end.  If the connection with a video visit is poor, we may have to switch to a telephone visit.  With either a video or telephone visit, we are not always able to ensure that we have a secure connection.   I need to obtain your verbal consent now.   Are you willing to proceed with your visit today?   MALANIE KOLOSKI has provided verbal consent on 04/26/2021 for a virtual visit (video or telephone).   [place], [place] 04/26/2021  10:05 AM

## 2021-04-26 NOTE — Patient Instructions (Signed)
Anne [name],  It was a pleasure to see you today.  As we discussed we will try to get you in for repeat EGD with Botox.  I will contact Dr. [name] first to make sure that you are able to hold your Plavix for 5 days.  Once we receive notification from him then you will hear from our office in regards to scheduling EGD at the hospital with Dr. Hilarie Fredrickson.  I have sent in a refill for your Metoclopramide.  Please let us know if we can be of further assistance before we see you next.  Sincerely, Ellouise Newer, PA-C

## 2021-04-26 NOTE — Telephone Encounter (Signed)
She can hold the plavix for 5 days prior to her procedure.

## 2021-04-26 NOTE — Progress Notes (Signed)
I connected with  Anne [name] on 04/26/21 by a video enabled telemedicine application and verified that I am speaking with the correct person using two identifiers.   I discussed the limitations of evaluation and management by telemedicine. The patient expressed understanding and agreed to proceed.   Patient located: At home Provider located: At [facility] GI office  Chief Complaint: Early satiety, bloating and gas  HPI:     Anne [name] is a 71 year old female with a history of CVA on chronic Plavix, COPD/pulmonary fibrosis, esophageal dysphagia with hypertensive LES responsive to Botox injection, vocal cord dysfunction, IBS with chronic constipation abdominal pain, history of IDA and migraines, who follows with Dr. Hilarie Fredrickson and is seen today via telemedicine visit for early satiety, bloating and gas.    02/12/2019 patient seen by Tye Savoy for reflux and was asymptomatic on Pantoprazole which was refilled.  Discussed esophageal dysphagia and hypertensive LES.  She had nice response to Botox a year prior and has started seeing a slow recurrence of symptoms.  She was scheduled for repeat EGD with Botox injection at the hospital.  Also discussed history of CVA on Plavix and was told on this 5 days prior to time procedure.    06/30/2019 EGD with injection of Botox to the LES.  She had nice response.    08/05/2020 EGD with injection of Botox to LES.  Nice response.    Today, the patient tells me that she has had a rough year with the death of 2 of her dogs, her husband was diagnosed with bladder cancer and she had cryoablation of the lesion in her kidney, needless to say she has had increased stress and has not been handling anything that has been going on with her.  Tells me that she has had time periods over the past few months where she just feels "nasty" in her gut.  Tells me only thing that seems to help is if she stops eating and only takes very small bites of food.  She has noticed that if she  eats anything over tablespoon during times of feeling bad that she just feels full like she will almost vomit.  Also describes a lot of bloating and gas and using Gas-X as much is 6 times a day.  Tells me during these times she tries to stay away from dairy and really just eat soft smaller foods.  She cooks her fiber well.  Also uses Metoclopramide 5 mg 3 times daily before meals.  Tells me this seems to help a little bit with symptoms.  Describes that she thinks she needs another EGD with Botox injection because this typically helps all of the symptoms and especially the feeling of fullness.  Associated symptoms include some weight loss.    Denies fever, chills, change in bowel habits or blood in her stool.  Past Medical History:  Diagnosis Date   Allergic rhinitis    Allergy    multiple, mostly aspirin, levaquin and shellfish.   Anemia    Asthma    Cataract 2014   exraction with len implant   Complication of anesthesia    Breathing problems upon waking up. 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   Compressed cervical disc    COPD (chronic obstructive pulmonary disease) (HCC)    CVA (cerebral infarction)    Dyspnea    Esophageal dysmotility    Heart murmur    as child   History of hiatal hernia  IBS (irritable bowel syndrome)    Kidney lesion 2018   Left   Migraine    PICC (peripherally inserted central catheter) removal 02/20/2017   PONV (postoperative nausea and vomiting)    Problems with swallowing    intermittently   Pulmonary fibrosis ([place])    Shingles    Stroke ([place])    slurred speech, drawn face, imaging normal, occurred twice, [facility]-"TIA" if antything" 02/20/17-no residual effects   Tracheostomy in place [facility])    Vocal cord paresis     Past Surgical History:  Procedure Laterality Date   ABDOMINAL HYSTERECTOMY     APPLICATION OF A-CELL OF HEAD/NECK N/A 02/21/2017   Procedure: APPLICATION OF A-CELL OF HEAD/NECK;  Surgeon: Wallace Going, DO;  Location: [place];  Service: Plastics;  Laterality: N/A;   BACK SURGERY     BOTOX INJECTION N/A 07/29/2013   Procedure: BOTOX INJECTION;  Surgeon: Jerene Bears, MD;  [facility];  Service: Gastroenterology;  Laterality: N/A;   BOTOX INJECTION N/A 05/04/2015   Procedure: BOTOX INJECTION;  Surgeon: Jerene Bears, MD;  [facility];  Service: Gastroenterology;  Laterality: N/A;   BOTOX INJECTION N/A 02/18/2018   Procedure: BOTOX INJECTION;  Surgeon: Jerene Bears, MD;  [facility];  Service: Gastroenterology;  Laterality: N/A;   BOTOX INJECTION N/A 06/30/2019   Procedure: BOTOX INJECTION;  Surgeon: Jerene Bears, MD;  [facility];  Service: Gastroenterology;  Laterality: N/A;   BOTOX INJECTION N/A 08/05/2020   Procedure: BOTOX INJECTION;  Surgeon: Jerene Bears, MD;  [facility];  Service: Gastroenterology;  Laterality: N/A;   BREAST BIOPSY Left    neg   BREAST SURGERY Left 2002   bx of skin   CHOLECYSTECTOMY     COLONOSCOPY     DIAGNOSTIC LAPAROSCOPY     ESOPHAGEAL MANOMETRY N/A 12/16/2012   Procedure: ESOPHAGEAL MANOMETRY (EM);  Surgeon: Jerene Bears, MD;  [facility];  Service: Gastroenterology;  Laterality: N/A;   ESOPHAGOGASTRODUODENOSCOPY (EGD) WITH PROPOFOL N/A 07/29/2013   Procedure: ESOPHAGOGASTRODUODENOSCOPY (EGD) WITH PROPOFOL;  Surgeon: Jerene Bears, MD;  [facility];  Service: Gastroenterology;  Laterality: N/A;  with botox injection   ESOPHAGOGASTRODUODENOSCOPY (EGD) WITH PROPOFOL N/A 05/04/2015   Procedure: ESOPHAGOGASTRODUODENOSCOPY (EGD) WITH PROPOFOL;  Surgeon: Jerene Bears, MD;  [facility];  Service: Gastroenterology;  Laterality: N/A;   ESOPHAGOGASTRODUODENOSCOPY (EGD) WITH PROPOFOL N/A 02/18/2018   Procedure: ESOPHAGOGASTRODUODENOSCOPY (EGD) WITH PROPOFOL;  Surgeon: Jerene Bears, MD;  [facility];  Service: Gastroenterology;  Laterality: N/A;    ESOPHAGOGASTRODUODENOSCOPY (EGD) WITH PROPOFOL N/A 06/30/2019   Procedure: ESOPHAGOGASTRODUODENOSCOPY (EGD) WITH PROPOFOL;  Surgeon: Jerene Bears, MD;  [facility];  Service: Gastroenterology;  Laterality: N/A;   ESOPHAGOGASTRODUODENOSCOPY (EGD) WITH PROPOFOL N/A 08/05/2020   Procedure: ESOPHAGOGASTRODUODENOSCOPY (EGD) WITH PROPOFOL;  Surgeon: Jerene Bears, MD;  [facility];  Service: Gastroenterology;  Laterality: N/A;   EYE SURGERY     Catarct surgery 2014   Eye Surgery AS Child Left    INCISION AND DRAINAGE OF WOUND N/A 02/21/2017   Procedure: IRRIGATION AND DEBRIDEMENT WOUND NECK;  Surgeon: Wallace Going, DO;  Location: [place];  Service: Plastics;  Laterality: N/A;   JEJUNOSTOMY FEEDING TUBE     x2 both failed. no longer has   LUMBAR LAMINECTOMY/DECOMPRESSION MICRODISCECTOMY Bilateral 05/13/2018   Procedure: Laminectomy and Foraminotomy - Lumbar four-Lumbar five- bilateral;  Surgeon: Eustace Moore, MD;  Location: [place];  Service: Neurosurgery;  Laterality: Bilateral;   LUMBAR LAMINECTOMY/DECOMPRESSION MICRODISCECTOMY Bilateral 11/08/2020   Procedure: Laminectomy and Foraminotomy - bilateral - Lumbar two-Lumbar three - Lumbar three-Lumbar four;  Surgeon: Eustace Moore, MD;  Location: [place];  Service: Neurosurgery;  Laterality: Bilateral;   MULTIPLE TOOTH EXTRACTIONS     2 teeth removed   OOPHORECTOMY     POSTERIOR CERVICAL FUSION/FORAMINOTOMY N/A 01/10/2017   Procedure: LAMINECTOMY AND FORAMINOTOMY CERVICAL FOUR-CERVICAL FIVE, CERVICAL FIVE-SIX POSTERIOR CERVICAL INSTRUMENT FUSION CERVICAL THREE-CERVICAL SEVEN,CERVICAL LAMINECTOMY CERVICAL THREE-CERVICAL SEVEN.;  Surgeon: Eustace Moore, MD;  Location: [place];  Service: Neurosurgery;  Laterality: N/A;  posterior   TONSILLECTOMY     TRACHEOSTOMY  1996   [facility] Dr. Kathyrn Sheriff   TUBAL LIGATION     VIDEO BRONCHOSCOPY Bilateral 11/20/2012   Procedure: VIDEO BRONCHOSCOPY WITH FLUORO;  Surgeon: Juanito Doom, MD;  [facility];  Service: Cardiopulmonary;  Laterality: Bilateral;    Current Outpatient Medications  Medication Sig Dispense Refill   acetaminophen (TYLENOL) 500 MG tablet Take 1,000 mg by mouth 2 (two) times daily as needed for moderate pain or headache.     albuterol (VENTOLIN HFA) 108 (90 Base) MCG/ACT inhaler Inhale 2 puffs into the lungs every 6 (six) hours as needed for wheezing or shortness of breath. 18 g 2   azelastine (ASTELIN) 0.1 % nasal spray Place 1 spray into both nostrils 2 (two) times daily. Use in each nostril as directed 30 mL 6   budesonide (PULMICORT) 0.25 MG/2ML nebulizer solution Take 2 mLs (0.25 mg total) by nebulization 2 (two) times daily. Use after DuoNeb 120 mL 11   cetirizine (ZYRTEC) 10 MG tablet TAKE 1 TABLET BY MOUTH EVERY DAY (NOT COVERED 90 tablet 0   clopidogrel (PLAVIX) 75 MG tablet Take 1 tablet (75 mg total) by mouth in the morning. 90 tablet 1   diphenhydrAMINE (BENADRYL) 25 MG tablet Take 25 mg by mouth daily as needed for allergies.      doxycycline (VIBRA-TABS) 100 MG tablet Take 1 tablet (100 mg total) by mouth 2 (two) times daily. 14 tablet 0   EPINEPHrine 0.3 mg/0.3 mL IJ SOAJ injection Inject 0.3 mg into the muscle as needed for anaphylaxis.      estradiol (ESTRACE) 1 MG tablet TAKE 1 TABLET BY MOUTH EVERY DAY 90 tablet 1   fluticasone (FLONASE) 50 MCG/ACT nasal spray Place 1 spray into both nostrils daily. 16 g 3   formoterol (PERFOROMIST) 20 MCG/2ML nebulizer solution Take 2 mLs (20 mcg total) by nebulization 2 (two) times daily. 2 mL 0   furosemide (LASIX) 20 MG tablet TAKE 1 TABLET BY MOUTH EVERY DAY AS NEEDED 90 tablet 1   gabapentin (NEURONTIN) 300 MG capsule Take 300 mg by mouth 3 (three) times daily as needed (pain).     guaiFENesin-codeine 100-10 MG/5ML syrup Take 5 mLs by mouth 2 (two) times daily as needed for cough. 120 mL 0   HYDROcodone-homatropine (HYCODAN) 5-1.5 MG/5ML syrup Take 5 mLs by mouth every 8 (eight)  hours as needed for cough. 120 mL 0   ibuprofen (ADVIL,MOTRIN) 200 MG tablet Take 400-800 mg by mouth daily as needed for headache or moderate pain.     ipratropium-albuterol (DUONEB) 0.5-2.5 (3) MG/3ML SOLN Take 3 mLs by nebulization every 4 (four) hours as needed. Dx 496 120 mL 2   ketoconazole (NIZORAL) 2 % shampoo Apply 1 application topically 2 (two) times a week. 120 mL 0   methocarbamol (ROBAXIN) 500 MG tablet Take 1  tablet (500 mg total) by mouth 4 (four) times daily. 45 tablet 0   metoCLOPramide (REGLAN) 5 MG tablet TAKE 1 TABLET (5 MG TOTAL) BY MOUTH 3 (THREE) TIMES DAILY WITH MEALS AS NEEDED FOR NAUSEA. 60 tablet 0   metoprolol succinate (TOPROL-XL) 25 MG 24 hr tablet TAKE 1/2 TABLET BY MOUTH EVERY DAY (Patient taking differently: Take 12.5 mg by mouth daily.) 45 tablet 3   montelukast (SINGULAIR) 10 MG tablet TAKE 1 TABLET BY MOUTH EVERYDAY AT BEDTIME 90 tablet 1   mupirocin ointment (BACTROBAN) 2 % Apply 1 application topically daily as needed (FOR [facility] SITE IRRITATION).     Naphazoline-Pheniramine 0.027-0.315 % SOLN Place 1 drop into both eyes daily as needed (allergies).      nystatin (MYCOSTATIN) 100000 UNIT/ML suspension Take 5 mLs (500,000 Units total) by mouth 4 (four) times daily for 10 days. 200 mL 0   pantoprazole (PROTONIX) 40 MG tablet TAKE ONE TABLET EVERY DAY NEEDS FOR OFFICE USE VISIT FOR MORE REFILLS. 90 tablet 2   potassium chloride (KLOR-CON) 10 MEQ tablet Take 10 mEq by mouth daily as needed (low potassium).     potassium chloride 20 MEQ/15ML (10%) SOLN Take 30 mLs (40 mEq total) by mouth daily for 3 days. 90 mL 0   rizatriptan (MAXALT-MLT) 10 MG disintegrating tablet TAKE 1 TABLET (10 MG TOTAL) BY MOUTH DAILY AS NEEDED FOR MIGRAINE. MAY REPEAT FOR ONE DOSE 2 HOURS AFTER THE FIRST ONE IF NEEDED. 10 tablet 0   sodium chloride (OCEAN) 0.65 % SOLN nasal spray Place 1 spray into both nostrils every 4 (four) hours as needed for congestion.      White Petrolatum-Mineral Oil  (LUBRICANT EYE) OINT Place 1 application into both eyes 2 (two) times daily as needed (for dry eyes).      No current facility-administered medications for this visit.    Allergies as of 04/26/2021 - Review Complete 04/12/2021  Allergen Reaction Noted   Asa [aspirin] Shortness Of Breath, Swelling, and Other (See Comments) 08/05/2012   Bee venom Anaphylaxis and Shortness Of Breath 02/08/2017   Epinephrine Shortness Of Breath and Palpitations 10/23/2013   Feraheme [ferumoxytol] Shortness Of Breath 10/31/2017   Influenza vaccines Shortness Of Breath and Other (See Comments) 01/14/2013   Pfizer-biontech covid-19 vacc [covid-19 mrna vacc (moderna)] Anaphylaxis 08/19/2019   Quinolones Swelling and Other (See Comments) 07/01/2013   Shellfish allergy Anaphylaxis 08/13/2012   Ciprofloxacin Swelling and Other (See Comments) 07/01/2013   Clarithromycin Nausea Only 10/23/2013   Erythromycin Nausea Only 10/23/2013   Levaquin [levofloxacin in d5w] Swelling and Other (See Comments) 08/05/2012   Peanut-containing drug products Other (See Comments) 01/04/2017   Septra [sulfamethoxazole-trimethoprim] Diarrhea 04/18/2013   Telithromycin Nausea Only 10/23/2013   Zanaflex [tizanidine] Other (See Comments) 02/08/2017   Adhesive [tape] Rash and Other (See Comments) 08/03/2016    Family History  Problem Relation Age of Onset   Asthma Cousin    COPD Cousin    Breast cancer Maternal Grandmother 63   Asthma Father    Kidney cancer Father    Arrhythmia Father    Lung cancer Father    Lung cancer Paternal Uncle    COPD Paternal Grandfather    Alcohol abuse Mother    Breast cancer Maternal Aunt 54   Bladder Cancer Neg Hx    Colon cancer Neg Hx    Esophageal cancer Neg Hx    Pancreatic cancer Neg Hx    Stomach cancer Neg Hx    Liver disease Neg Hx  Social History   Socioeconomic History   Marital status: Married    Spouse name: Not on file   Number of children: 4   Years of education: Not on  file   Highest education level: Not on file  Occupational History   Not on file  Tobacco Use   Smoking status: Never   Smokeless tobacco: Never  Vaping Use   Vaping Use: Never used  Substance and Sexual Activity   Alcohol use: Yes    Alcohol/week: 0.0 standard drinks    Comment: occ.   Drug use: No   Sexual activity: Yes  Other Topics Concern   Not on file  Social History Narrative   Lives in [place] with husband.  She has four children.   Retired from [employer]: Low Risk    Difficulty of Paying Living Expenses: Not hard at all  Food Insecurity: No Food Insecurity   Worried About Charity fundraiser in the Last Year: Never true   Arboriculturist in the Last Year: Never true  Transportation Needs: No Transportation Needs   Lack of Transportation (Medical): No   Lack of Transportation (Non-Medical): No  Physical Activity: Not on file  Stress: No Stress Concern Present   Feeling of Stress : Not at all  Social Connections: Unknown   Frequency of Communication with Friends and Family: More than three times a week   Frequency of Social Gatherings with Friends and Family: More than three times a week   Attends Religious Services: Not on Electrical engineer or Organizations: Yes   Attends Music therapist: More than 4 times per year   Marital Status: Married  Human resources officer Violence: Not At Risk   Fear of Current or Ex-Partner: No   Emotionally Abused: No   Physically Abused: No   Sexually Abused: No    Review of Systems:    Constitutional: No weight loss, fever or chills Cardiovascular: No chest pain Respiratory: No SOB  Gastrointestinal: See HPI and otherwise negative   Physical Exam:  Vital signs: Ht 5' 3.5" (1.613 m)    Wt 99 lb (44.9 kg)    BMI 17.26 kg/m   Reported by patient/ Telemedicine visit  Constitutional:   Pleasant Elderly Caucasian female appears to be in  NAD, Well developed, Well nourished, alert and cooperative Head:  Normocephalic and atraumatic. Eyes:   No icterus. Conjunctiva pink. Ears:  Normal auditory acuity. Psychiatric: Demonstrates good judgement and reason without abnormal affect or behaviors.  MOST RECENT LABS AND IMAGING: CBC    Component Value Date/Time   WBC 5.8 10/27/2020 1139   RBC 4.39 10/27/2020 1139   HGB 14.4 10/27/2020 1139   HGB 12.6 02/17/2014 1303   HCT 42.3 10/27/2020 1139   HCT 37.9 02/17/2014 1303   PLT 196 10/27/2020 1139   PLT 208 02/17/2014 1303   MCV 96.4 10/27/2020 1139   MCV 97 02/17/2014 1303   MCH 32.8 10/27/2020 1139   MCHC 34.0 10/27/2020 1139   RDW 12.2 10/27/2020 1139   RDW 12.5 02/17/2014 1303   LYMPHSABS 1.3 10/27/2020 1139   LYMPHSABS 1.2 02/17/2014 1303   MONOABS 0.6 10/27/2020 1139   MONOABS 0.3 02/17/2014 1303   EOSABS 0.2 10/27/2020 1139   EOSABS 0.2 02/17/2014 1303   BASOSABS 0.0 10/27/2020 1139   BASOSABS 0.0 02/17/2014 1303  CMP     Component Value Date/Time   NA 142 02/14/2021 1528   K 3.8 02/14/2021 1528   K 3.6 04/03/2012 1653   CL 104 02/14/2021 1528   CO2 31 02/14/2021 1528   GLUCOSE 93 02/14/2021 1528   BUN 12 02/14/2021 1528   CREATININE 1.03 02/14/2021 1528   CREATININE 0.94 10/30/2016 1135   CALCIUM 8.9 02/14/2021 1528   PROT 6.2 08/17/2020 0909   ALBUMIN 4.0 08/17/2020 0909   AST 14 08/17/2020 0909   ALT 9 08/17/2020 0909   ALKPHOS 82 08/17/2020 0909   BILITOT 0.6 08/17/2020 0909   GFRNONAA >60 10/27/2020 1139   GFRAA >60 05/07/2018 1327    Assessment: 1.  GERD: Controlled on Pantoprazole 40 mg daily 2.  Esophageal dysphagia with hypertensive via LES: Nice response to Botox x3 so far, patient has noticed a slow recurrence of symptoms and a fullness when she eats with increased bloating which she attributes to this problem 3.  History of CVA on Plavix  Plan: 1.  Patient would like to repeat EGD with Botox injection.  This will need to be  scheduled at the hospital with Dr. Hilarie Fredrickson.  Discussed with patient that I will first need to get clearance for her to hold her Plavix for 5 days.  I have sent notification to Dr. [name].  Once we receive that she will get a phone call from our clinic in regards to scheduling this procedure. 2.  For now continue Pantoprazole 40 mg daily 3.  Refilled Reglan 5 mg 3 times daily, 20-30 minutes before meals.  #90 with 11 refills 4.  Patient to follow in clinic per recommendations after time of procedure.  Ellouise Newer, PA-C [facility] 04/26/2021, 9:26 AM  Cc: [name], MD

## 2021-04-27 ENCOUNTER — Ambulatory Visit (INDEPENDENT_AMBULATORY_CARE_PROVIDER_SITE_OTHER): Payer: Medicare Other | Admitting: *Deleted

## 2021-04-27 ENCOUNTER — Other Ambulatory Visit: Payer: Self-pay

## 2021-04-27 ENCOUNTER — Ambulatory Visit
Admission: RE | Admit: 2021-04-27 | Discharge: 2021-04-27 | Disposition: A | Discharge status: Home / Self Care | Payer: Medicare Other | Source: Ambulatory Visit | Attending: Pulmonary Disease | Admitting: Pulmonary Disease

## 2021-04-27 DIAGNOSIS — E538 Deficiency of other specified B group vitamins: Secondary | ICD-10-CM

## 2021-04-27 DIAGNOSIS — J84112 Idiopathic pulmonary fibrosis: Secondary | ICD-10-CM | POA: Insufficient documentation

## 2021-04-28 MED ORDER — CYANOCOBALAMIN 1000 MCG/ML IJ SOLN
1000.0000 ug | Freq: Once | INTRAMUSCULAR | Status: AC
  Administered 2021-04-27: 1000 ug via INTRAMUSCULAR

## 2021-04-28 NOTE — Progress Notes (Signed)
Patient presented for B 12 injection to left deltoid, patient voiced no concerns nor showed any signs of distress during injection. 

## 2021-04-29 ENCOUNTER — Encounter: Payer: Self-pay | Admitting: Pulmonary Disease

## 2021-04-29 ENCOUNTER — Telehealth: Payer: Self-pay | Admitting: Pulmonary Disease

## 2021-04-29 ENCOUNTER — Other Ambulatory Visit: Payer: Self-pay | Admitting: Family Medicine

## 2021-04-29 DIAGNOSIS — Z1231 Encounter for screening mammogram for malignant neoplasm of breast: Secondary | ICD-10-CM

## 2021-04-29 NOTE — Telephone Encounter (Signed)
Dr Patsey Berthold please advise.   Patient is requesting CT results.   Patient would also like to know the issue that is mentioned on the CT could be inflamation from the cryoablation procedure done in November?

## 2021-04-29 NOTE — Telephone Encounter (Signed)
With regards to her lungs these are stable from prior.  The small effusions seen on the left could be due to either infection and she did have some upper respiratory things going on or small residual from the procedure she had in November (cryoablation) as this was very close to her diaphragm.  This should hopefully continue to resolve.  The amount of fluid is very small.

## 2021-04-30 NOTE — Progress Notes (Signed)
Addendum: Reviewed and agree with assessment and management plan. Abdimalik Mayorquin M, MD  

## 2021-05-01 ENCOUNTER — Other Ambulatory Visit: Payer: Self-pay | Admitting: Family Medicine

## 2021-05-01 DIAGNOSIS — N951 Menopausal and female climacteric states: Secondary | ICD-10-CM

## 2021-05-02 NOTE — Telephone Encounter (Signed)
Patient called and stated she is having testing done tomorrow to receive the next booster Retail buyer) to see if she is allergic to it or not. states she has been having trouble with her breathing all weekend and wants to know if she should reschedule for now.

## 2021-05-02 NOTE — Telephone Encounter (Signed)
Called and gave patient Dr Domingo Dimes rec. Nothing further needed.

## 2021-05-02 NOTE — Telephone Encounter (Signed)
Would definitely reschedule for now.

## 2021-05-03 ENCOUNTER — Encounter: Payer: Medicare Other | Admitting: Allergy & Immunology

## 2021-05-13 ENCOUNTER — Other Ambulatory Visit
Admission: RE | Admit: 2021-05-13 | Discharge: 2021-05-13 | Disposition: A | Discharge status: Home / Self Care | Payer: Medicare Other | Source: Ambulatory Visit | Attending: Pulmonary Disease | Admitting: Pulmonary Disease

## 2021-05-13 DIAGNOSIS — Z01812 Encounter for preprocedural laboratory examination: Secondary | ICD-10-CM | POA: Diagnosis present

## 2021-05-13 DIAGNOSIS — Z20822 Contact with and (suspected) exposure to covid-19: Secondary | ICD-10-CM | POA: Diagnosis not present

## 2021-05-13 LAB — SARS CORONAVIRUS 2 (TAT 6-24 HRS): SARS Coronavirus 2: NEGATIVE

## 2021-05-16 ENCOUNTER — Other Ambulatory Visit: Payer: Self-pay

## 2021-05-16 ENCOUNTER — Ambulatory Visit (HOSPITAL_COMMUNITY)
Admission: RE | Admit: 2021-05-16 | Discharge: 2021-05-16 | Disposition: A | Discharge status: Home / Self Care | Payer: Medicare Other | Source: Ambulatory Visit | Attending: Acute Care | Admitting: Acute Care

## 2021-05-16 DIAGNOSIS — J398 Other specified diseases of upper respiratory tract: Secondary | ICD-10-CM | POA: Diagnosis not present

## 2021-05-16 DIAGNOSIS — Z930 Tracheostomy status: Secondary | ICD-10-CM | POA: Diagnosis not present

## 2021-05-16 DIAGNOSIS — Z430 Encounter for attention to tracheostomy: Secondary | ICD-10-CM | POA: Insufficient documentation

## 2021-05-16 NOTE — Progress Notes (Signed)
Tracheostomy Procedure Note  MYISHIA KASIK [id number] 02-Sep-1950  Pre Procedure Tracheostomy Information Lidocaine neb gievn prior to bronchoscopy for observation of airway Trach Brand: Shiley Size:  4.0 FEN Style: Uncuffed Secured by: Velcro   Procedure: Bronchoscopy and Trach change and trach cleaning    Post Procedure Tracheostomy Information  Trach Brand: Shiley Size:  4.0 [id number] Style: Uncuffed Secured by: Sutures   Post Procedure Evaluation:  ETCO2 positive color change from yellow to purple : Yes.   Vital signs:VSS Patients current condition: stable Complications: No apparent complications Trach site exam: clean, dry Wound care done:N/A Patient did tolerate procedure well.   Education: Engineer, maintenance (IT) style education and Red cap education  Prescription needs:  none    Additional needs:  And additonal  trach [id number] and 6 red caps given to patient at this visti

## 2021-05-17 DIAGNOSIS — J398 Other specified diseases of upper respiratory tract: Secondary | ICD-10-CM | POA: Insufficient documentation

## 2021-05-17 DIAGNOSIS — Z930 Tracheostomy status: Secondary | ICD-10-CM | POA: Insufficient documentation

## 2021-05-17 NOTE — Progress Notes (Signed)
Reason for visit Planned trach change   HPI 71 year old female who I follow for trach management. She is also followed by ENT near her home and has several sub specialists she sees for her multiple health needs. 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 We initially tried Portex which she did not tolerate well (I thought it looked like a contact dermatitis from the silicone). We then switched to a fenestrated trach which was identical to her previous shiley w/ the exception of the fenestrated inner cannula. She did well for a while but then began to c/o pain and discomfort at trach site. I tried helping her over phone with this per her request and we tried both abx and also empirically treating her for thrush. She eventually went to her ENT who scoped her and found ulceration at the site where the fenestrated cannula was irritating the posterior wall of trachea. Since then she has used the inner cannula that occludes the fenestrated part of the trach and the pain has resolved. She presents today for planned trach change   Review of Systems  Constitutional:  Negative for chills, fever, malaise/fatigue and weight loss.  HENT: Negative.    Eyes: Negative.   Respiratory: Negative.    Cardiovascular: Negative.   Gastrointestinal: Negative.   Genitourinary: Negative.   Musculoskeletal: Negative.   Skin: Negative.    Exam  Pulse ox 90s on room air  General 3 yof ambulatory and no distress HENT 4 cuffless fenestrated trach w/stoma site unremarkable Pulm clear  Card rrr Abd soft Neuro intact  Procedure She was pre-medicated w/ inhaled lidocaine. After that a bronchoscopy was inserted into the trach and the trach was slowly pulled out of the airway to allow for full evaluation of the posterior tracheal wall. I was  able to flex scope upward and examine entire posterior wall where trach would be in contact and found area of concern now free of ulceration. At that point we replaced her trach to discuss options. After a long discussion she agreed to change to size 4 cuffless trach.   Plan Cont routine trach care ROV q 3 mo   My time 30 minutes  Erick Colace ACNP-BC [employer] Pager # [phone] OR # [phone] if no answer

## 2021-05-25 ENCOUNTER — Ambulatory Visit: Payer: Medicare Other

## 2021-05-26 ENCOUNTER — Ambulatory Visit (INDEPENDENT_AMBULATORY_CARE_PROVIDER_SITE_OTHER): Payer: Medicare Other | Admitting: Pulmonary Disease

## 2021-05-26 ENCOUNTER — Other Ambulatory Visit: Payer: Self-pay

## 2021-05-26 ENCOUNTER — Encounter: Payer: Self-pay | Admitting: Pulmonary Disease

## 2021-05-26 VITALS — BP 98/62 | HR 64 | Temp 97.6°F | Ht 63.0 in | Wt 102.2 lb

## 2021-05-26 DIAGNOSIS — Z930 Tracheostomy status: Secondary | ICD-10-CM

## 2021-05-26 DIAGNOSIS — J84112 Idiopathic pulmonary fibrosis: Secondary | ICD-10-CM

## 2021-05-26 DIAGNOSIS — J4540 Moderate persistent asthma, uncomplicated: Secondary | ICD-10-CM | POA: Diagnosis not present

## 2021-05-26 MED ORDER — ARFORMOTEROL TARTRATE 15 MCG/2ML IN NEBU: 15.0000 ug | INHALATION_SOLUTION | Freq: Two times a day (BID) | RESPIRATORY_TRACT | 6 refills | Status: DC

## 2021-05-26 NOTE — Patient Instructions (Signed)
The nebulizer medication has been sent to your pharmacy it will be slightly different but similar to what you are now getting a sample.  It is what is covered by your insurance.  This will be Brovana or alformoterol, it is basically the same as what you are getting now as samples. ? ? ?We will see him in follow-up in 3 months time call sooner should any new problems arise. ?

## 2021-05-26 NOTE — Progress Notes (Signed)
Subjective:    Patient ID: Anne [name], female    DOB: 09-28-1950, 71 y.o.   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 No frank honeycombing. Findings have progressed since  2014. 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 Chest x-ray 05/14/2020: Severe pleural-parenchymal scarring unchanged from prior tracheostomy tube in good position. Alpha-1 antitrypsin 06/14/2020: Normal level at 105 (she has known PI MZ). 2D echo 07/07/2020: LVEF 55 to 60%.  No wall motion abnormalities, grade II DD, aortic regurgitation mild to moderate. Coronary CT 08/12/2020: Coronary calcium score 0.   INTERVAL HISTORY: [name] exchange 05/16/2021 by our [name] in [place]. Last visit with me on 04/12/2021.  Prior evaluation by Dr.Juengel , ENT tracheal granulation tissue noted, she had wanted another opinion, referred to Dr. Early Osmond at [facility] she canceled that appointment.  HPI This is a 71 year old lifelong never smoker with a history of mild to moderate persistent asthma and idiopathic pleural-parenchymal fibroelastosis who presents for follow-up.  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  She continues to have issues with her tracheostomy more than anything.  Difficulties with finding a proper fitting tracheostomy.  She has many sensitivities to medications for asthma,she does not tolerate inhaled corticosteroids due to headaches.  She does not use DuoNeb regularly as she should.  She is compliant with Singulair.  Continues to have issues with shortness of breath frequently and wheezing.  Feels that this is gradually getting worse.  Albuterol or DuoNeb does help some of her shortness of breath but not for a long time and as noted she is not religious using the DuoNeb.  She does not endorse any chest pain.  No orthopnea or paroxysmal nocturnal dyspnea.  Voices no other complaint.  She had requested previously a second opinion with regards to her trach issues which seems to be the main driver for her symptoms.  She canceled the appointment with Dr. Beverely Risen at [facility] electing to go to ENT at [facility] in [place].  Review of Systems A 10 point review of systems was performed and it is as noted above otherwise negative.  Patient Active Problem List   Diagnosis Date Noted   Tracheostomy status ([place])    Tracheal granulation    Stress 03/11/2021   Neuropathy 08/17/2020   Scalp lesion 08/17/2020   History of COVID-19 04/19/2020   Ganglion cyst of wrist, right 04/19/2020   Nontraumatic nail splitting 04/19/2020   History of anaphylaxis 04/18/2019   Liver lesion 10/11/2018   S/P lumbar laminectomy 05/13/2018   Low back pain 02/07/2018   Iron deficiency anemia 12/11/2017   Mild persistent asthma 10/10/2017   Palpitations 08/03/2017   Bilateral leg edema 08/03/2017   Anxiety 08/03/2017   Current long-term use of postmenopausal hormone replacement therapy 03/19/2017   S/P cervical spinal fusion 01/10/2017  Kidney lesion 12/13/2016   Migraine 09/12/2016   Esophageal dysphagia 06/19/2016   Skin nodule 01/10/2016   Hypokalemia  01/10/2016   Dysuria 12/15/2015   Degenerative arthritis of cervical spine 10/28/2015   Gastritis    B12 deficiency 03/15/2015   Abdominal bloating 09/03/2014   Slow transit constipation 09/03/2014   Menopausal symptom 09/03/2014   Vocal cord dysfunction 07/15/2014   History of TIA (transient ischemic attack) 06/03/2014   Chronic pain syndrome 04/28/2014   Fatigue 09/30/2013   Hypertensive lower esophageal sphincter 07/29/2013   Achalasia of esophagus 07/29/2013   Edema 07/01/2013   Chronic pulmonary aspiration 06/06/2013   Insomnia 12/13/2012   HLD (hyperlipidemia) 12/13/2012   Anemia 08/13/2012   Pulmonary fibrosis ([place]) 08/05/2012   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    Allergies  Allergen Reactions   Asa [Aspirin] Shortness Of Breath, Swelling and Other (See Comments)    Tongue swells   Bee Venom Anaphylaxis and Shortness Of Breath   Epinephrine Shortness Of Breath and Palpitations    Occurs when pt is given double the dose, pt would use epipen as needed    Feraheme [Ferumoxytol] Shortness Of Breath    Light headed    Influenza Vaccines Shortness Of Breath and Other (See Comments)    Can take flu vaccines without eggs    Pfizer-Biontech Covid-19 Vacc [Covid-19 Mrna Vacc (Moderna)] Anaphylaxis   Quinolones Swelling and Other (See Comments)    Feet swell   Shellfish Allergy Anaphylaxis   Ciprofloxacin Swelling and Other (See Comments)    ALL MEDS ENDING IN -FLOXACIN MAKE THE FEET SWELL   Clarithromycin Nausea Only    Abdomina pain   Erythromycin Nausea Only    Abdominal pain   Levaquin [Levofloxacin In D5w] Swelling and Other (See Comments)    Feet and legs ache and SWELL   Peanut-Containing Drug Products Other (See Comments)    Wheezing (boiled or raw peanuts)   Septra  [Sulfamethoxazole-Trimethoprim] Diarrhea   Telithromycin Nausea Only    Abdominal pain   Zanaflex [Tizanidine] Other (See Comments)    Caused the patient to feel "spaced out" and "not well"   Adhesive [Tape] Rash and Other (See Comments)    Paper works tape works fine    Current Meds  Medication Sig   acetaminophen (TYLENOL) 500 MG tablet Take 1,000 mg by mouth 2 (two) times daily as needed for moderate pain or headache.   albuterol (VENTOLIN HFA) 108 (90 Base) MCG/ACT inhaler Inhale 2 puffs into the lungs every 6 (six) hours as needed for wheezing or shortness of breath.   azelastine (ASTELIN) 0.1 % nasal spray Place 1 spray into both nostrils 2 (two) times daily. Use in each nostril as directed   cetirizine (ZYRTEC) 10 MG tablet TAKE 1 TABLET BY MOUTH EVERY DAY (NOT COVERED   clopidogrel (PLAVIX) 75 MG tablet Take 1 tablet (75 mg total) by mouth in the morning.   diphenhydrAMINE (BENADRYL) 25 MG tablet Take 25 mg by mouth daily as needed for allergies.    EPINEPHrine 0.3 mg/0.3 mL IJ SOAJ injection Inject 0.3 mg into the muscle as needed for anaphylaxis.    estradiol (ESTRACE) 1 MG tablet TAKE 1 TABLET BY MOUTH EVERY DAY   fluticasone (FLONASE) 50 MCG/ACT nasal spray Place 1  spray into both nostrils daily.   formoterol (PERFOROMIST) 20 MCG/2ML nebulizer solution Take 2 mLs (20 mcg total) by nebulization 2 (two) times daily.   furosemide (LASIX) 20 MG tablet TAKE 1 TABLET BY MOUTH EVERY DAY AS NEEDED   guaiFENesin-codeine 100-10 MG/5ML syrup Take 5 mLs by mouth 2 (two) times daily as needed for cough.   HYDROcodone-homatropine (HYCODAN) 5-1.5 MG/5ML syrup Take 5 mLs by mouth every 8 (eight) hours as needed for cough.   ibuprofen (ADVIL,MOTRIN) 200 MG tablet Take 400-800 mg by mouth daily as needed for headache or moderate pain.   ipratropium-albuterol (DUONEB) 0.5-2.5 (3) MG/3ML SOLN Take 3 mLs by nebulization every 4 (four) hours as needed. Dx 496   metoCLOPramide (REGLAN) 5 MG tablet Take  1 tablet (5 mg total) by mouth 3 (three) times daily with meals as needed for nausea.   metoprolol succinate (TOPROL-XL) 25 MG 24 hr tablet TAKE 1/2 TABLET BY MOUTH EVERY DAY (Patient taking differently: Take 12.5 mg by mouth daily.)   montelukast (SINGULAIR) 10 MG tablet TAKE 1 TABLET BY MOUTH EVERYDAY AT BEDTIME   mupirocin ointment (BACTROBAN) 2 % Apply 1 application topically daily as needed (FOR [facility] SITE IRRITATION).   Naphazoline-Pheniramine 0.027-0.315 % SOLN Place 1 drop into both eyes daily as needed (allergies).    pantoprazole (PROTONIX) 40 MG tablet TAKE ONE TABLET EVERY DAY NEEDS FOR OFFICE USE VISIT FOR MORE REFILLS.   potassium chloride (KLOR-CON) 10 MEQ tablet Take 10 mEq by mouth daily as needed (low potassium).   rizatriptan (MAXALT-MLT) 10 MG disintegrating tablet TAKE 1 TABLET (10 MG TOTAL) BY MOUTH DAILY AS NEEDED FOR MIGRAINE. MAY REPEAT FOR ONE DOSE 2 HOURS AFTER THE FIRST ONE IF NEEDED.   White Petrolatum-Mineral Oil (LUBRICANT EYE) OINT Place 1 application into both eyes 2 (two) times daily as needed (for dry eyes).    Immunization History  Administered Date(s) Administered   Fluad Quad(high Dose 65+) 02/14/2021   Influenza Inj Mdck Quad Pf 01/15/2019, 01/16/2020   Influenza, Quadrivalent, Recombinant, Inj, Pf 02/06/2017, 12/19/2017   Influenza,inj,Quad PF,6+ Mos 12/19/2012   Influenza,trivalent, recombinat, inj, PF 01/05/2014   Janssen (J&J) SARS-COV-2 Vaccination 01/05/2020, 03/10/2020   PFIZER(Purple Top)SARS-COV-2 Vaccination 05/09/2019   Pneumococcal Conjugate-13 01/23/2014   Pneumococcal Polysaccharide-23 03/11/2021       Objective:   Physical Exam BP 98/62 (BP Location: Left Arm, Patient Position: Sitting, Cuff Size: Normal)   Pulse 64   Temp 97.6 F (36.4 C) (Oral)   Ht 5\' 3"  (1.6 m)   Wt 102 lb 3.2 oz (46.4 kg)   SpO2 96%   BMI 18.10 kg/m  GENERAL: Well-developed, thin but not cachectic woman, in no acute distress.  She is fully  ambulatory. HEAD: Normocephalic, atraumatic. EYES: Pupils equal, round, reactive to light.  No scleral icterus.   MOUTH: Nose/mouth/throat not examined due to masking requirements for COVID 19. NECK: Supple.Trachea midline. No JVD.  No adenopathy.  Tracheostomy tube in place. PULMONARY: Good air entry bilaterally.    Coarse breath sounds, no wheezes, no other adventitious sounds.   CARDIOVASCULAR: S1 and S2. Regular rate and rhythm.  No rubs, murmurs or gallops heard. ABDOMEN: Benign. MUSCULOSKELETAL: No joint deformity, no clubbing, no edema. NEUROLOGIC: No focal deficit noted.  No gait disturbance.  Phonates well with tracheostomy with speech valve. SKIN: Limited exam shows no rashes. PSYCH: Mood and behavior normal.     Assessment & Plan:     ICD-10-CM   1. Moderate persistent allergic asthma  J45.40  Intolerant of multiple inhaled medications Trial of Brovana Continue Singulair Patient does not tolerate ICS    2. Idiopathic pleuroparenchymal fibroelastosis ([place])  J84.112    No evidence of progression Follow expectantly    3. Tracheostomy dependence ([place])  Z93.0    Will seek second opinion from ENT at [facility] ordered this encounter  Medications   arformoterol (BROVANA) 15 MCG/2ML NEBU    Sig: Take 2 mLs (15 mcg total) by nebulization 2 (two) times daily.    Dispense:  120 mL    Refill:  6   See the patient in follow-up in 3 months time she is to call sooner should any new problems arise.   Renold Don, MD Advanced Bronchoscopy PCCM [facility]    *This note was dictated using voice recognition software/Dragon.  Despite best efforts to proofread, errors can occur which can change the meaning. Any transcriptional errors that result from this process are unintentional and may not be fully corrected at the time of dictation.

## 2021-05-27 ENCOUNTER — Ambulatory Visit (INDEPENDENT_AMBULATORY_CARE_PROVIDER_SITE_OTHER): Payer: Medicare Other | Admitting: *Deleted

## 2021-05-27 ENCOUNTER — Encounter: Payer: Self-pay | Admitting: Hematology and Oncology

## 2021-05-27 ENCOUNTER — Other Ambulatory Visit (HOSPITAL_COMMUNITY): Payer: Self-pay

## 2021-05-27 ENCOUNTER — Telehealth: Payer: Self-pay | Admitting: Pharmacy Technician

## 2021-05-27 ENCOUNTER — Other Ambulatory Visit: Payer: Self-pay

## 2021-05-27 DIAGNOSIS — E538 Deficiency of other specified B group vitamins: Secondary | ICD-10-CM

## 2021-05-27 MED ORDER — ARFORMOTEROL TARTRATE 15 MCG/2ML IN NEBU: 15.0000 ug | INHALATION_SOLUTION | Freq: Two times a day (BID) | RESPIRATORY_TRACT | 6 refills | Status: DC

## 2021-05-27 MED ORDER — ARFORMOTEROL TARTRATE 15 MCG/2ML IN NEBU
15.0000 ug | INHALATION_SOLUTION | Freq: Two times a day (BID) | RESPIRATORY_TRACT | 6 refills | Status: DC
Start: 2021-05-27 — End: 2021-05-27

## 2021-05-27 MED ORDER — CYANOCOBALAMIN 1000 MCG/ML IJ SOLN
1000.0000 ug | Freq: Once | INTRAMUSCULAR | Status: AC
  Administered 2021-05-27: 1000 ug via INTRAMUSCULAR

## 2021-05-27 NOTE — Progress Notes (Signed)
Pt arrived for B12 injection, given in R deltoid. Pt tolerated injection well, showed no signs of distress nor voiced any concerns.  

## 2021-05-27 NOTE — Telephone Encounter (Signed)
Rx corrected and sent to pharmacy.  ?Nothing further needed.  ? ?

## 2021-05-31 ENCOUNTER — Other Ambulatory Visit: Payer: Self-pay | Admitting: Internal Medicine

## 2021-05-31 DIAGNOSIS — R1319 Other dysphagia: Secondary | ICD-10-CM

## 2021-05-31 NOTE — Telephone Encounter (Signed)
We have had a cancellation for procedure at the hospital. Patient has been placed for endo with botox on 06/16/21 at 730 am, 600 am arrival. Patient has been given instructions on this and verbalizes understanding. She is also advised that she has been cleared to hold plavix 5 days prior to her upcoming procedure.  ? ?Written instructions made available to patient via mychart. ?

## 2021-06-08 ENCOUNTER — Encounter (HOSPITAL_COMMUNITY): Payer: Self-pay | Admitting: Internal Medicine

## 2021-06-13 ENCOUNTER — Telehealth: Payer: Self-pay | Admitting: Family Medicine

## 2021-06-13 ENCOUNTER — Ambulatory Visit
Admission: RE | Admit: 2021-06-13 | Discharge: 2021-06-13 | Disposition: A | Discharge status: Home / Self Care | Payer: Medicare Other | Source: Ambulatory Visit | Attending: Family Medicine | Admitting: Family Medicine

## 2021-06-13 ENCOUNTER — Other Ambulatory Visit: Payer: Self-pay

## 2021-06-13 DIAGNOSIS — Z1231 Encounter for screening mammogram for malignant neoplasm of breast: Secondary | ICD-10-CM | POA: Insufficient documentation

## 2021-06-13 NOTE — Telephone Encounter (Signed)
Patient dropped off a handicapp renewal form to be complete. Form is up front in Dr [name] color folder. Patient's husband has an appointment at office on 3/27 and will pick up form then. ?

## 2021-06-14 NOTE — Telephone Encounter (Signed)
Signed. Please fill in out information and make available for the patient to pick up.

## 2021-06-14 NOTE — Telephone Encounter (Signed)
Can you confirm with the patient if she is able to walk more than 200 feet without having to stop?  Does she walk with a cane or walker?  I need to have a reason to do the handicap renewal form as I have to check off a box on why she meets criteria. ?

## 2021-06-14 NOTE — Telephone Encounter (Signed)
I called and spoke with the patient and informed her that the form was ready and she stated her husband had a appointment with the provider on Monday and he will pick up then. I will hold it back here until his appointment.   Daanish Copes,cma  ?

## 2021-06-14 NOTE — Telephone Encounter (Signed)
I called the patient and spoke with her and she stated that due to her asthma and pulmonary fibrosis she cannot walk more than 200 feet without stopping because she would be out of breath.  Patient stated she does not walk with a cane or walker.  Anne [name],cma  ?

## 2021-06-14 NOTE — Telephone Encounter (Signed)
The form was placed in the sign basket.  Andreina Outten,cma  ?

## 2021-06-16 ENCOUNTER — Encounter (HOSPITAL_COMMUNITY)
Admission: RE | Disposition: A | Discharge status: Home / Self Care | Payer: Self-pay | Source: Ambulatory Visit | Attending: Internal Medicine

## 2021-06-16 ENCOUNTER — Encounter: Payer: Medicare Other | Admitting: Allergy & Immunology

## 2021-06-16 ENCOUNTER — Ambulatory Visit (HOSPITAL_COMMUNITY)
Admission: RE | Admit: 2021-06-16 | Discharge: 2021-06-16 | Disposition: A | Discharge status: Home / Self Care | Payer: Medicare Other | Source: Ambulatory Visit | Attending: Internal Medicine | Admitting: Internal Medicine

## 2021-06-16 ENCOUNTER — Encounter (HOSPITAL_COMMUNITY): Payer: Self-pay | Admitting: Internal Medicine

## 2021-06-16 ENCOUNTER — Other Ambulatory Visit: Payer: Self-pay

## 2021-06-16 ENCOUNTER — Ambulatory Visit (HOSPITAL_COMMUNITY): Payer: Medicare Other | Admitting: Certified Registered"

## 2021-06-16 ENCOUNTER — Ambulatory Visit (HOSPITAL_BASED_OUTPATIENT_CLINIC_OR_DEPARTMENT_OTHER): Payer: Medicare Other | Admitting: Certified Registered"

## 2021-06-16 DIAGNOSIS — K219 Gastro-esophageal reflux disease without esophagitis: Secondary | ICD-10-CM | POA: Insufficient documentation

## 2021-06-16 DIAGNOSIS — Z7902 Long term (current) use of antithrombotics/antiplatelets: Secondary | ICD-10-CM | POA: Diagnosis not present

## 2021-06-16 DIAGNOSIS — R1310 Dysphagia, unspecified: Secondary | ICD-10-CM

## 2021-06-16 DIAGNOSIS — K224 Dyskinesia of esophagus: Secondary | ICD-10-CM | POA: Diagnosis not present

## 2021-06-16 DIAGNOSIS — I10 Essential (primary) hypertension: Secondary | ICD-10-CM | POA: Insufficient documentation

## 2021-06-16 DIAGNOSIS — F419 Anxiety disorder, unspecified: Secondary | ICD-10-CM

## 2021-06-16 DIAGNOSIS — J449 Chronic obstructive pulmonary disease, unspecified: Secondary | ICD-10-CM | POA: Diagnosis not present

## 2021-06-16 DIAGNOSIS — R1319 Other dysphagia: Secondary | ICD-10-CM

## 2021-06-16 HISTORY — PX: ESOPHAGOGASTRODUODENOSCOPY (EGD) WITH PROPOFOL: SHX5813

## 2021-06-16 HISTORY — PX: BOTOX INJECTION: SHX5754

## 2021-06-16 SURGERY — ESOPHAGOGASTRODUODENOSCOPY (EGD) WITH PROPOFOL
Anesthesia: Monitor Anesthesia Care

## 2021-06-16 MED ORDER — PROPOFOL 10 MG/ML IV BOLUS
INTRAVENOUS | Status: DC | PRN
  Administered 2021-06-16 (×2): 20 mg via INTRAVENOUS
  Administered 2021-06-16: 10 mg via INTRAVENOUS

## 2021-06-16 MED ORDER — ONABOTULINUMTOXINA 100 UNITS IJ SOLR
INTRAMUSCULAR | Status: AC
Start: 2021-06-16 — End: ?
  Filled 2021-06-16: qty 100

## 2021-06-16 MED ORDER — PHENYLEPHRINE HCL (PRESSORS) 10 MG/ML IV SOLN
INTRAVENOUS | Status: AC
  Filled 2021-06-16: qty 1

## 2021-06-16 MED ORDER — SODIUM CHLORIDE (PF) 0.9 % IJ SOLN
INTRAMUSCULAR | Status: DC | PRN
  Administered 2021-06-16: 4 mL via SUBMUCOSAL

## 2021-06-16 MED ORDER — PROPOFOL 500 MG/50ML IV EMUL
INTRAVENOUS | Status: AC
Start: 2021-06-16 — End: ?
  Filled 2021-06-16: qty 200

## 2021-06-16 MED ORDER — PROPOFOL 500 MG/50ML IV EMUL
INTRAVENOUS | Status: DC | PRN
  Administered 2021-06-16: 125 ug/kg/min via INTRAVENOUS

## 2021-06-16 MED ORDER — SODIUM CHLORIDE 0.9 % IV SOLN: INTRAVENOUS | Status: DC

## 2021-06-16 MED ORDER — PROPOFOL 1000 MG/100ML IV EMUL
INTRAVENOUS | Status: AC
  Filled 2021-06-16: qty 200

## 2021-06-16 MED ORDER — LACTATED RINGERS IV SOLN
INTRAVENOUS | Status: DC
  Administered 2021-06-16: 1000 mL via INTRAVENOUS

## 2021-06-16 MED ORDER — LIDOCAINE 2% (20 MG/ML) 5 ML SYRINGE
INTRAMUSCULAR | Status: DC | PRN
  Administered 2021-06-16: 40 mg via INTRAVENOUS

## 2021-06-16 MED ORDER — PROPOFOL 500 MG/50ML IV EMUL
INTRAVENOUS | Status: AC
  Filled 2021-06-16: qty 150

## 2021-06-16 SURGICAL SUPPLY — 15 items

## 2021-06-16 NOTE — Op Note (Addendum)
[facility] ?Patient Name: Anne [name] ?Procedure Date: 06/16/2021 ?MRN: [id number] ?Attending MD: Jerene Bears , MD ?Date of Birth: 17-May-1950 ?CSN: [id number] ?Age: 71 ?Admit Type: Outpatient ?Procedure:                Upper GI endoscopy ?Indications:              Dysphagia, hypertensive LES with favorable response  ?                          to BOTOX injection (last performed May 2022) ?Providers:                Lajuan Lines. Hilarie Fredrickson, MD, Ladoris Gene, RN, Jacquelin Hawking  ?                          Houle, Technician ?Referring MD:              ?Medicines:                Monitored Anesthesia Care ?Complications:            No immediate complications. ?Estimated Blood Loss:     Estimated blood loss was minimal. ?Procedure:                Pre-Anesthesia Assessment: ?                          - Prior to the procedure, a History and Physical  ?                          was performed, and patient medications and  ?                          allergies were reviewed. The patient's tolerance of  ?                          previous anesthesia was also reviewed. The risks  ?                          and benefits of the procedure and the sedation  ?                          options and risks were discussed with the patient.  ?                          All questions were answered, and informed consent  ?                          was obtained. Prior Anticoagulants: The patient has  ?                          taken Plavix (clopidogrel), last dose was 5 days  ?                          prior to procedure. ASA Grade Assessment: III - A  ?  patient with severe systemic disease. After  ?                          reviewing the risks and benefits, the patient was  ?                          deemed in satisfactory condition to undergo the  ?                          procedure. ?                          After obtaining informed consent, the endoscope was  ?                          passed under direct vision.  Throughout the  ?                          procedure, the patient's blood pressure, pulse, and  ?                          oxygen saturations were monitored continuously. The  ?                          [id number]-[id number] ([id number]) Olympus endoscope was introduced  ?                          through the mouth, and advanced to the second part  ?                          of duodenum. The upper GI endoscopy was  ?                          accomplished without difficulty. The patient  ?                          tolerated the procedure well. ?Scope In: ?Scope Out: ?Findings: ?     The examined esophagus was normal. LES was successfully injected in 4  ?     quadrants (1 cc = 25 U BOTOX) totalling 100 units botulinum toxin. ?     The entire examined stomach was normal. ?     The examined duodenum was normal. ?Impression:               - Normal esophagus. LES injected with botulinum  ?                          toxin in 4 quadrants (25 units per injection = 100  ?                          units total). ?                          - Normal stomach. ?                          -  Normal examined duodenum. ?                          - No specimens collected. ?Moderate Sedation: ?     N/A ?Recommendation:           - Patient has a contact number available for  ?                          emergencies. The signs and symptoms of potential  ?                          delayed complications were discussed with the  ?                          patient. Return to normal activities tomorrow.  ?                          Written discharge instructions were provided to the  ?                          patient. ?                          - Resume previous diet. ?                          - Continue present medications. ?                          - Resume Plavix (clopidogrel) at prior dose  ?                          tomorrow. Refer to managing physician for further  ?                          adjustment of therapy. ?                          - Repeat upper  endoscopy PRN. ?Procedure Code(s):        --- Professional --- ?                          [phone], Esophagogastroduodenoscopy, flexible,  ?                          transoral; with directed submucosal injection(s),  ?                          any substance ?Diagnosis Code(s):        --- Professional --- ?                          R13.10, Dysphagia, unspecified ?CPT copyright 2019 American Medical Association. All rights reserved. ?The codes documented in this report are preliminary and upon coder review may  ?be revised to meet current compliance requirements. ?Jerene Bears, MD ?06/16/2021 8:06:18 AM ?This report has been signed electronically. ?Number of Addenda:  0 ?

## 2021-06-16 NOTE — Transfer of Care (Signed)
Immediate Anesthesia Transfer of Care Note ? ?Patient: Anne [name] ? ?Procedure(s) Performed: ESOPHAGOGASTRODUODENOSCOPY (EGD) WITH PROPOFOL ?BOTOX INJECTION ? ?Patient Location: PACU and Endoscopy Unit ? ?Anesthesia Type:MAC ? ?Level of Consciousness: awake and alert  ? ?Airway & Oxygen Therapy: Patient Spontanous Breathing and Patient connected to face mask oxygen ? ?Post-op Assessment: Report given to RN and Post -op Vital signs reviewed and stable ? ?Post vital signs: Reviewed and stable ? ?Last Vitals:  ?Vitals Value Taken Time  ?BP    ?Temp    ?Pulse 60 06/16/21 0811  ?Resp 10 06/16/21 0811  ?SpO2 100 % 06/16/21 0811  ?Vitals shown include unvalidated device data. ? ?Last Pain:  ?Vitals:  ? 06/16/21 0631  ?TempSrc: Tympanic  ?PainSc: 0-No pain  ?   ? ?  ? ?Complications: No notable events documented. ?

## 2021-06-16 NOTE — Anesthesia Procedure Notes (Signed)
Date/Time: 06/16/2021 7:44 AM ?Performed by: Cynda Familia, CRNA ?Oxygen Delivery Method: Simple face mask ?Placement Confirmation: positive ETCO2 ?Dental Injury: Teeth and Oropharynx as per pre-operative assessment  ? ? ? ? ?

## 2021-06-16 NOTE — Discharge Instructions (Signed)
YOU HAD AN ENDOSCOPIC PROCEDURE TODAY: Refer to the procedure report and other information in the discharge instructions given to you for any specific questions about what was found during the examination. If this information does not answer your questions, please call [place] office at [phone] to clarify.  ° °YOU SHOULD EXPECT: Some feelings of bloating in the abdomen. Passage of more gas than usual. Walking can help get rid of the air that was put into your GI tract during the procedure and reduce the bloating. If you had a lower endoscopy (such as a colonoscopy or flexible sigmoidoscopy) you may notice spotting of blood in your stool or on the toilet paper. Some abdominal soreness may be present for a day or two, also. ° °DIET: Your first meal following the procedure should be a light meal and then it is ok to progress to your normal diet. A half-sandwich or bowl of soup is an example of a good first meal. Heavy or fried foods are harder to digest and may make you feel nauseous or bloated. Drink plenty of fluids but you should avoid alcoholic beverages for 24 hours. If you had a esophageal dilation, please see attached instructions for diet.   ° °ACTIVITY: Your care partner should take you home directly after the procedure. You should plan to take it easy, moving slowly for the rest of the day. You can resume normal activity the day after the procedure however YOU SHOULD NOT DRIVE, use power tools, machinery or perform tasks that involve climbing or major physical exertion for 24 hours (because of the sedation medicines used during the test).  ° °SYMPTOMS TO REPORT IMMEDIATELY: °A gastroenterologist can be reached at any hour. Please call [phone]  for any of the following symptoms:  °Following lower endoscopy (colonoscopy, flexible sigmoidoscopy) °Excessive amounts of blood in the stool  °Significant tenderness, worsening of abdominal pains  °Swelling of the abdomen that is new, acute  °Fever of 100° or  higher  °Following upper endoscopy (EGD, EUS, ERCP, esophageal dilation) °Vomiting of blood or coffee ground material  °New, significant abdominal pain  °New, significant chest pain or pain under the shoulder blades  °Painful or persistently difficult swallowing  °New shortness of breath  °Black, tarry-looking or red, bloody stools ° °FOLLOW UP:  °If any biopsies were taken you will be contacted by phone or by letter within the next 1-3 weeks. Call [phone]  if you have not heard about the biopsies in 3 weeks.  °Please also call with any specific questions about appointments or follow up tests. ° °

## 2021-06-16 NOTE — Anesthesia Postprocedure Evaluation (Signed)
Anesthesia Post Note ? ?Patient: Anne [name] ? ?Procedure(s) Performed: ESOPHAGOGASTRODUODENOSCOPY (EGD) WITH PROPOFOL ?BOTOX INJECTION ? ?  ? ?Patient location during evaluation: PACU ?Anesthesia Type: MAC ?Level of consciousness: awake and alert ?Pain management: pain level controlled ?Vital Signs Assessment: post-procedure vital signs reviewed and stable ?Respiratory status: spontaneous breathing, nonlabored ventilation, respiratory function stable and patient connected to nasal cannula oxygen ?Cardiovascular status: stable and blood pressure returned to baseline ?Postop Assessment: no apparent nausea or vomiting ?Anesthetic complications: no ? ? ?No notable events documented. ? ?Last Vitals:  ?Vitals:  ? 06/16/21 0821 06/16/21 0831  ?BP: (!) 114/42 (!) 142/53  ?Pulse: 62 60  ?Resp: 13 17  ?Temp:    ?SpO2: 99% 100%  ?  ?Last Pain:  ?Vitals:  ? 06/16/21 0831  ?TempSrc:   ?PainSc: 0-No pain  ? ? ?  ?  ?  ?  ?  ?  ? ?[name] ? ? ? ? ?

## 2021-06-16 NOTE — H&P (Addendum)
?  ? ?GASTROENTEROLOGY PROCEDURE H&P NOTE  ? ?Primary Care Physician: ?[name], MD ? ? ? ?Reason for Procedure:   EGD with botox to LES for recurrent dysphagia/hypertonic LES ? ?Plan:    EGD with injection ? ? ?The nature of the procedure, as well as the risks, benefits, and alternatives were carefully and thoroughly reviewed with the patient. Ample time for discussion and questions allowed. The patient understood, was satisfied, and agreed to proceed.  ? ? ? ?HPI: ?Anne [name] is a 71 y.o. female who presents for EGD.  Medical history as below.   No recent chest pain or shortness of breath.  No abdominal pain today. ? ?Past Medical History:  ?Diagnosis Date  ? Allergic rhinitis   ? Allergy   ? multiple, mostly aspirin, levaquin and shellfish.  ? Anemia   ? Asthma   ? Cataract 2014  ? exraction with len implant  ? Complication of anesthesia   ? Breathing problems upon waking up. 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  ? Compressed cervical disc   ? COPD (chronic obstructive pulmonary disease) ([place])   ? CVA (cerebral infarction)   ? Dyspnea   ? Esophageal dysmotility   ? Heart murmur   ? as child  ? History of hiatal hernia   ? IBS (irritable bowel syndrome)   ? Kidney lesion 2018  ? Left  ? Migraine   ? PICC (peripherally inserted central catheter) removal 02/20/2017  ? PONV (postoperative nausea and vomiting)   ? Problems with swallowing   ? intermittently  ? Pulmonary fibrosis ([place])   ? Shingles   ? Stroke [facility])   ? slurred speech, drawn face, imaging normal, occurred twice, [facility]-"TIA" if antything" 02/20/17-no residual effects  ? Tracheostomy in place [facility])   ? Vocal cord paresis   ? ? ?Past Surgical History:  ?Procedure Laterality Date  ? ABDOMINAL HYSTERECTOMY    ? APPLICATION OF A-CELL OF HEAD/NECK N/A 02/21/2017  ? Procedure: APPLICATION OF A-CELL OF HEAD/NECK;  Surgeon: Wallace Going, DO;  Location: [place];  Service: Plastics;  Laterality: N/A;  ? BACK SURGERY     ? BOTOX INJECTION N/A 07/29/2013  ? Procedure: BOTOX INJECTION;  Surgeon: Jerene Bears, MD;  [facility];  Service: Gastroenterology;  Laterality: N/A;  ? BOTOX INJECTION N/A 05/04/2015  ? Procedure: BOTOX INJECTION;  Surgeon: Jerene Bears, MD;  [facility];  Service: Gastroenterology;  Laterality: N/A;  ? BOTOX INJECTION N/A 02/18/2018  ? Procedure: BOTOX INJECTION;  Surgeon: Jerene Bears, MD;  Location[facility];  Service: Gastroenterology;  Laterality: N/A;  ? BOTOX INJECTION N/A 06/30/2019  ? Procedure: BOTOX INJECTION;  Surgeon: Jerene Bears, MD;  Location: [facility];  Service: Gastroenterology;  Laterality: N/A;  ? BOTOX INJECTION N/A 08/05/2020  ? Procedure: BOTOX INJECTION;  Surgeon: Jerene Bears, MD;  Location: [facility];  Service: Gastroenterology;  Laterality: N/A;  ? BREAST BIOPSY Left   ? neg  ? BREAST SURGERY Left 2002  ? bx of skin  ? CHOLECYSTECTOMY    ? COLONOSCOPY    ? DIAGNOSTIC LAPAROSCOPY    ? ESOPHAGEAL MANOMETRY N/A 12/16/2012  ? Procedure: ESOPHAGEAL MANOMETRY (EM);  Surgeon: Jerene Bears, MD;  [facility];  Service: Gastroenterology;  Laterality: N/A;  ? ESOPHAGOGASTRODUODENOSCOPY (EGD) WITH PROPOFOL N/A 07/29/2013  ? Procedure: ESOPHAGOGASTRODUODENOSCOPY (EGD) WITH PROPOFOL;  Surgeon: Jerene Bears, MD;  [facility];  Service: Gastroenterology;  Laterality: N/A;  with botox injection  ? ESOPHAGOGASTRODUODENOSCOPY (EGD) WITH PROPOFOL N/A 05/04/2015  ? Procedure: ESOPHAGOGASTRODUODENOSCOPY (EGD) WITH PROPOFOL;  Surgeon: Jerene Bears, MD;  [facility];  Service: Gastroenterology;  Laterality: N/A;  ? ESOPHAGOGASTRODUODENOSCOPY (EGD) WITH PROPOFOL N/A 02/18/2018  ? Procedure: ESOPHAGOGASTRODUODENOSCOPY (EGD) WITH PROPOFOL;  Surgeon: Jerene Bears, MD;  [facility];  Service: Gastroenterology;  Laterality: N/A;  ? ESOPHAGOGASTRODUODENOSCOPY (EGD) WITH PROPOFOL N/A 06/30/2019  ? Procedure: ESOPHAGOGASTRODUODENOSCOPY  (EGD) WITH PROPOFOL;  Surgeon: Jerene Bears, MD;  [facility];  Service: Gastroenterology;  Laterality: N/A;  ? ESOPHAGOGASTRODUODENOSCOPY (EGD) WITH PROPOFOL N/A 08/05/2020  ? Procedure: ESOPHAGOGASTRODUODENOSCOPY (EGD) WITH PROPOFOL;  Surgeon: Jerene Bears, MD;  [facility];  Service: Gastroenterology;  Laterality: N/A;  ? EYE SURGERY    ? Catarct surgery 2014  ? Eye Surgery AS Child Left   ? INCISION AND DRAINAGE OF WOUND N/A 02/21/2017  ? Procedure: IRRIGATION AND DEBRIDEMENT WOUND NECK;  Surgeon: Wallace Going, DO;  Location: [place];  Service: Plastics;  Laterality: N/A;  ? JEJUNOSTOMY FEEDING TUBE    ? x2 both failed. no longer has  ? LUMBAR LAMINECTOMY/DECOMPRESSION MICRODISCECTOMY Bilateral 05/13/2018  ? Procedure: Laminectomy and Foraminotomy - Lumbar four-Lumbar five- bilateral;  Surgeon: Eustace Moore, MD;  Location: [place];  Service: Neurosurgery;  Laterality: Bilateral;  ? LUMBAR LAMINECTOMY/DECOMPRESSION MICRODISCECTOMY Bilateral 11/08/2020  ? Procedure: Laminectomy and Foraminotomy - bilateral - Lumbar two-Lumbar three - Lumbar three-Lumbar four;  Surgeon: Eustace Moore, MD;  Location: [place];  Service: Neurosurgery;  Laterality: Bilateral;  ? MULTIPLE TOOTH EXTRACTIONS    ? 2 teeth removed  ? OOPHORECTOMY    ? POSTERIOR CERVICAL FUSION/FORAMINOTOMY N/A 01/10/2017  ? Procedure: LAMINECTOMY AND FORAMINOTOMY CERVICAL FOUR-CERVICAL FIVE, CERVICAL FIVE-SIX POSTERIOR CERVICAL INSTRUMENT FUSION CERVICAL THREE-CERVICAL SEVEN,CERVICAL LAMINECTOMY CERVICAL THREE-CERVICAL SEVEN.;  Surgeon: Eustace Moore, MD;  Location: [place];  Service: Neurosurgery;  Laterality: N/A;  posterior  ? TONSILLECTOMY    ? TRACHEOSTOMY  1996  ? done at [facility], Dr. Kathyrn Sheriff  ? TUBAL LIGATION    ? VIDEO BRONCHOSCOPY Bilateral 11/20/2012  ? Procedure: VIDEO BRONCHOSCOPY WITH FLUORO;  Surgeon: Juanito Doom, MD;  [facility];  Service: Cardiopulmonary;  Laterality: Bilateral;  ? ? ?Prior to  Admission medications   ?Medication Sig Start Date End Date Taking? Authorizing Provider  ?acetaminophen (TYLENOL) 500 MG tablet Take 1,000 mg by mouth 2 (two) times daily as needed for moderate pain or headache.   Yes [provider]  ?albuterol (VENTOLIN HFA) 108 (90 Base) MCG/ACT inhaler Inhale 2 puffs into the lungs every 6 (six) hours as needed for wheezing or shortness of breath. 04/06/20  Yes Tyler Pita, MD  ?arformoterol (BROVANA) 15 MCG/2ML NEBU Take 2 mLs (15 mcg total) by nebulization 2 (two) times daily. ?Patient taking differently: Take 15 mcg by nebulization daily as needed (allergies). 05/27/21  Yes Tyler Pita, MD  ?azelastine (ASTELIN) 0.1 % nasal spray Place 1 spray into both nostrils 2 (two) times daily. Use in each nostril as directed ?Patient taking differently: Place 1 spray into both nostrils daily as needed for allergies. Use in each nostril as directed 12/04/19  Yes Chesley Mires, MD  ?cetirizine (ZYRTEC) 10 MG tablet TAKE 1 TABLET BY MOUTH EVERY DAY (NOT COVERED 04/06/21  Yes [name], MD  ?clopidogrel (PLAVIX) 75 MG tablet Take 1 tablet (75 mg total) by mouth in the morning. 04/07/21  Yes [name], MD  ?  diphenhydrAMINE (BENADRYL) 25 MG tablet Take 50 mg by mouth daily as needed for allergies (Asthma).   Yes [provider]  ?estradiol (ESTRACE) 1 MG tablet TAKE 1 TABLET BY MOUTH EVERY DAY 05/02/21  Yes [name], MD  ?fluticasone (FLONASE) 50 MCG/ACT nasal spray Place 1 spray into both nostrils daily. 01/10/21  Yes Martyn Ehrich, NP  ?furosemide (LASIX) 20 MG tablet TAKE 1 TABLET BY MOUTH EVERY DAY AS NEEDED ?Patient taking differently: Take 20 mg by mouth daily. 03/07/21  Yes [name], MD  ?ibuprofen (ADVIL,MOTRIN) 200 MG tablet Take 400-800 mg by mouth daily as needed for headache or moderate pain.   Yes [provider]  ?ipratropium-albuterol (DUONEB) 0.5-2.5 (3) MG/3ML SOLN Take 3 mLs by nebulization every 4  (four) hours as needed. Dx 496 03/08/17  Yes Wilhelmina Mcardle, MD  ?metoCLOPramide (REGLAN) 5 MG tablet Take 1 tablet (5 mg total) by mouth 3 (three) times daily with meals as needed for nausea. 04/26/21  Yes

## 2021-06-16 NOTE — Anesthesia Preprocedure Evaluation (Addendum)
Anesthesia Evaluation  ?Patient identified by MRN, date of birth, ID band ?Patient awake ? ? ? ?Reviewed: ?Allergy & Precautions, NPO status , Patient's Chart, lab work & pertinent test results ? ?History of Anesthesia Complications ?(+) PONV and history of anesthetic complications ? ?Airway ?Mallampati: Trach ? ? ? ? ? ? Dental ? ?(+) Teeth Intact ?  ?Pulmonary ?asthma , COPD,  ?  ? ?+ decreased breath sounds ? ? ? ? ? Cardiovascular ?hypertension, Pt. on home beta blockers ? ?Rhythm:Regular Rate:Normal ? ? ?  ?Neuro/Psych ? Headaches, Anxiety CVA   ? GI/Hepatic ?Neg liver ROS, hiatal hernia, GERD  Medicated,  ?Endo/Other  ?negative endocrine ROS ? Renal/GU ?negative Renal ROS  ? ?  ?Musculoskeletal ? ?(+) Arthritis ,  ? Abdominal ?Normal abdominal exam  (+)   ?Peds ? Hematology ?negative hematology ROS ?(+)   ?Anesthesia Other Findings ? ? Reproductive/Obstetrics ? ?  ? ? ? ? ? ? ? ? ? ? ? ? ? ?  ?  ? ? ? ? ? ? ? ?Anesthesia Physical ?Anesthesia Plan ? ?ASA: 3 ? ?Anesthesia Plan: MAC  ? ?Post-op Pain Management:   ? ?Induction: Intravenous ? ?PONV Risk Score and Plan: 0 and Propofol infusion ? ?Airway Management Planned: Natural Airway and Simple Face Mask ? ?Additional Equipment: None ? ?Intra-op Plan:  ? ?Post-operative Plan:  ? ?Informed Consent: I have reviewed the patients History and Physical, chart, labs and discussed the procedure including the risks, benefits and alternatives for the proposed anesthesia with the patient or authorized representative who has indicated his/her understanding and acceptance.  ? ? ? ? ? ?Plan Discussed with: CRNA ? ?Anesthesia Plan Comments:   ? ? ? ? ? ?Anesthesia Quick Evaluation ? ?

## 2021-06-17 ENCOUNTER — Encounter (HOSPITAL_COMMUNITY): Payer: Self-pay | Admitting: Internal Medicine

## 2021-06-29 ENCOUNTER — Ambulatory Visit (INDEPENDENT_AMBULATORY_CARE_PROVIDER_SITE_OTHER): Payer: Medicare Other | Admitting: *Deleted

## 2021-06-29 DIAGNOSIS — E538 Deficiency of other specified B group vitamins: Secondary | ICD-10-CM

## 2021-06-29 MED ORDER — CYANOCOBALAMIN 1000 MCG/ML IJ SOLN
1000.0000 ug | Freq: Once | INTRAMUSCULAR | Status: AC
  Administered 2021-06-29: 1000 ug via INTRAMUSCULAR

## 2021-06-29 NOTE — Progress Notes (Signed)
Pt arrived for B12 injection. Pt tolerated well. No signs or complaints of distress. ?

## 2021-07-05 ENCOUNTER — Other Ambulatory Visit: Payer: Self-pay | Admitting: Family Medicine

## 2021-07-14 NOTE — Progress Notes (Signed)
Pt arrived for B12 injection. Pt tolerated well. No signs or complaints of distress. ?

## 2021-07-19 ENCOUNTER — Ambulatory Visit (INDEPENDENT_AMBULATORY_CARE_PROVIDER_SITE_OTHER): Payer: Medicare Other | Admitting: Cardiovascular Disease

## 2021-07-19 ENCOUNTER — Encounter: Payer: Self-pay | Admitting: Cardiovascular Disease

## 2021-07-19 VITALS — BP 134/60 | HR 64 | Ht 63.5 in | Wt 101.0 lb

## 2021-07-19 DIAGNOSIS — I493 Ventricular premature depolarization: Secondary | ICD-10-CM | POA: Diagnosis not present

## 2021-07-19 DIAGNOSIS — I872 Venous insufficiency (chronic) (peripheral): Secondary | ICD-10-CM | POA: Diagnosis not present

## 2021-07-19 NOTE — Patient Instructions (Signed)
Medication Instructions:  ?Your physician has recommended you make the following change in your medication:  ? ?STOP Metoprolol ? ?*If you need a refill on your cardiac medications before your next appointment, please call your pharmacy* ? ? ?Lab Work: ?None ordered ?If you have labs (blood work) drawn today and your tests are completely normal, you will receive your results only by: ?MyChart Message (if you have MyChart) OR ?A paper copy in the mail ?If you have any lab test that is abnormal or we need to change your treatment, we will call you to review the results. ? ? ?Testing/Procedures: ?None ordered ? ? ?Follow-Up: ?At [facility], you and your health needs are our priority.  As part of our continuing mission to provide you with exceptional heart care, we have created designated Provider Care Teams.  These Care Teams include your primary Cardiologist (physician) and Advanced Practice Providers (APPs -  Physician Assistants and Nurse Practitioners) who all work together to provide you with the care you need, when you need it. ? ?We recommend signing up for the patient portal called "MyChart".  Sign up information is provided on this After Visit Summary.  MyChart is used to connect with patients for Virtual Visits (Telemedicine).  Patients are able to view lab/test results, encounter notes, upcoming appointments, etc.  Non-urgent messages can be sent to your provider as well.   ?To learn more about what you can do with MyChart, go to [email or web address].   ? ?Your next appointment:   ?Your physician wants you to follow-up in: 1 year You will receive a reminder letter in the mail two months in advance. If you don't receive a letter, please call our office to schedule the follow-up appointment. ? ? ?The format for your next appointment:   ?In Person ? ?Provider:   ?You may see Kathlyn Sacramento, MD or one of the following Advanced Practice Providers on your designated Care Team:   ?Murray Hodgkins, NP ?Christell Faith, PA-C ?Cadence Kathlen Mody, PA-C ? ? ?Other Instructions ?N/A ? ?Important Information About Sugar ? ? ? ? ? ? ?

## 2021-07-19 NOTE — Progress Notes (Signed)
?  ?Cardiology Office Note ? ? ?Date:  07/19/2021  ? ?ID:  Anne [name], DOB Jul 12, 1950, MRN [id number] ? ?PCP:  [name], MD  ?Cardiologist:   Kathlyn Sacramento, MD  ? ?Chief Complaint  ?Patient presents with  ? Other  ?  6 month follow up -- Meds reviewed verbally with patient.   ? ? ?  ?History of Present Illness: ?Anne [name] is a 71 y.o. female who is here today for follow-up visit regarding palpitations.  She is not a smoker and there is no family history of coronary artery disease. She did have possible TIAs in the past and has been on Plavix for that reason. She also has chronic nutritional problems requiring tube feeding in the past.  ? ?She had outpatient monitor done in 2019 which showed 1 short run of SVT.  She was started on small dose metoprolol.   ?She had cardiac CTA done in May 2022 which showed calcium score of 0 with no evidence of coronary artery disease.  Echocardiogram in April 2022 showed normal LV systolic function with no significant valvular abnormalities. ? ?She has been doing well with no recent chest pain, shortness of breath or palpitations.  She is only on small dose Toprol 12.5 mg once daily. ? ?Past Medical History:  ?Diagnosis Date  ? Allergic rhinitis   ? Allergy   ? multiple, mostly aspirin, levaquin and shellfish.  ? Anemia   ? Asthma   ? Cataract 2014  ? exraction with len implant  ? Complication of anesthesia   ? Breathing problems upon waking up. 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  ? Compressed cervical disc   ? COPD (chronic obstructive pulmonary disease) ([place])   ? CVA (cerebral infarction)   ? Dyspnea   ? Esophageal dysmotility   ? Heart murmur   ? as child  ? History of hiatal hernia   ? IBS (irritable bowel syndrome)   ? Kidney lesion 2018  ? Left  ? Migraine   ? PICC (peripherally inserted central catheter) removal 02/20/2017  ? PONV (postoperative nausea and vomiting)   ? Problems with swallowing   ? intermittently  ? Pulmonary  fibrosis ([place])   ? Shingles   ? Stroke [facility])   ? slurred speech, drawn face, imaging normal, occurred twice, [facility]-"TIA" if antything" 02/20/17-no residual effects  ? Tracheostomy in place [facility])   ? Vocal cord paresis   ? ? ?Past Surgical History:  ?Procedure Laterality Date  ? ABDOMINAL HYSTERECTOMY    ? APPLICATION OF A-CELL OF HEAD/NECK N/A 02/21/2017  ? Procedure: APPLICATION OF A-CELL OF HEAD/NECK;  Surgeon: Wallace Going, DO;  Location: [place];  Service: Plastics;  Laterality: N/A;  ? BACK SURGERY    ? BOTOX INJECTION N/A 07/29/2013  ? Procedure: BOTOX INJECTION;  Surgeon: Jerene Bears, MD;  [facility];  Service: Gastroenterology;  Laterality: N/A;  ? BOTOX INJECTION N/A 05/04/2015  ? Procedure: BOTOX INJECTION;  Surgeon: Jerene Bears, MD;  [facility];  Service: Gastroenterology;  Laterality: N/A;  ? BOTOX INJECTION N/A 02/18/2018  ? Procedure: BOTOX INJECTION;  Surgeon: Jerene Bears, MD;  Location[facility];  Service: Gastroenterology;  Laterality: N/A;  ? BOTOX INJECTION N/A 06/30/2019  ? Procedure: BOTOX INJECTION;  Surgeon: Jerene Bears, MD;  Location[facility];  Service: Gastroenterology;  Laterality: N/A;  ? BOTOX INJECTION N/A 08/05/2020  ? Procedure: BOTOX INJECTION;  Surgeon: Jerene Bears, MD;  [facility];  Service: Gastroenterology;  Laterality: N/A;  ? BOTOX INJECTION N/A 06/16/2021  ? Procedure: BOTOX INJECTION;  Surgeon: Jerene Bears, MD;  Location[facility];  Service: Gastroenterology;  Laterality: N/A;  ? BREAST BIOPSY Left   ? neg  ? BREAST SURGERY Left 2002  ? bx of skin  ? CHOLECYSTECTOMY    ? COLONOSCOPY    ? DIAGNOSTIC LAPAROSCOPY    ? ESOPHAGEAL MANOMETRY N/A 12/16/2012  ? Procedure: ESOPHAGEAL MANOMETRY (EM);  Surgeon: Jerene Bears, MD;  [facility];  Service: Gastroenterology;  Laterality: N/A;  ? ESOPHAGOGASTRODUODENOSCOPY (EGD) WITH PROPOFOL N/A 07/29/2013  ? Procedure: ESOPHAGOGASTRODUODENOSCOPY (EGD) WITH PROPOFOL;   Surgeon: Jerene Bears, MD;  [facility];  Service: Gastroenterology;  Laterality: N/A;  with botox injection  ? ESOPHAGOGASTRODUODENOSCOPY (EGD) WITH PROPOFOL N/A 05/04/2015  ? Procedure: ESOPHAGOGASTRODUODENOSCOPY (EGD) WITH PROPOFOL;  Surgeon: Jerene Bears, MD;  [facility];  Service: Gastroenterology;  Laterality: N/A;  ? ESOPHAGOGASTRODUODENOSCOPY (EGD) WITH PROPOFOL N/A 02/18/2018  ? Procedure: ESOPHAGOGASTRODUODENOSCOPY (EGD) WITH PROPOFOL;  Surgeon: Jerene Bears, MD;  [facility];  Service: Gastroenterology;  Laterality: N/A;  ? ESOPHAGOGASTRODUODENOSCOPY (EGD) WITH PROPOFOL N/A 06/30/2019  ? Procedure: ESOPHAGOGASTRODUODENOSCOPY (EGD) WITH PROPOFOL;  Surgeon: Jerene Bears, MD;  [facility];  Service: Gastroenterology;  Laterality: N/A;  ? ESOPHAGOGASTRODUODENOSCOPY (EGD) WITH PROPOFOL N/A 08/05/2020  ? Procedure: ESOPHAGOGASTRODUODENOSCOPY (EGD) WITH PROPOFOL;  Surgeon: Jerene Bears, MD;  [facility];  Service: Gastroenterology;  Laterality: N/A;  ? ESOPHAGOGASTRODUODENOSCOPY (EGD) WITH PROPOFOL N/A 06/16/2021  ? Procedure: ESOPHAGOGASTRODUODENOSCOPY (EGD) WITH PROPOFOL;  Surgeon: Jerene Bears, MD;  [facility];  Service: Gastroenterology;  Laterality: N/A;  ? EYE SURGERY    ? Catarct surgery 2014  ? Eye Surgery AS Child Left   ? INCISION AND DRAINAGE OF WOUND N/A 02/21/2017  ? Procedure: IRRIGATION AND DEBRIDEMENT WOUND NECK;  Surgeon: Wallace Going, DO;  Location: [place];  Service: Plastics;  Laterality: N/A;  ? JEJUNOSTOMY FEEDING TUBE    ? x2 both failed. no longer has  ? LUMBAR LAMINECTOMY/DECOMPRESSION MICRODISCECTOMY Bilateral 05/13/2018  ? Procedure: Laminectomy and Foraminotomy - Lumbar four-Lumbar five- bilateral;  Surgeon: Eustace Moore, MD;  Location: [place];  Service: Neurosurgery;  Laterality: Bilateral;  ? LUMBAR LAMINECTOMY/DECOMPRESSION MICRODISCECTOMY Bilateral 11/08/2020  ? Procedure: Laminectomy and Foraminotomy -  bilateral - Lumbar two-Lumbar three - Lumbar three-Lumbar four;  Surgeon: Eustace Moore, MD;  Location: [place];  Service: Neurosurgery;  Laterality: Bilateral;  ? MULTIPLE TOOTH EXTRACTIONS    ? 2 teeth removed  ? OOPHORECTOMY    ? POSTERIOR CERVICAL FUSION/FORAMINOTOMY N/A 01/10/2017  ? Procedure: LAMINECTOMY AND FORAMINOTOMY CERVICAL FOUR-CERVICAL FIVE, CERVICAL FIVE-SIX POSTERIOR CERVICAL INSTRUMENT FUSION CERVICAL THREE-CERVICAL SEVEN,CERVICAL LAMINECTOMY CERVICAL THREE-CERVICAL SEVEN.;  Surgeon: Eustace Moore, MD;  Location: [place];  Service: Neurosurgery;  Laterality: N/A;  posterior  ? TONSILLECTOMY    ? TRACHEOSTOMY  1996  ? done at [facility], Dr. Kathyrn Sheriff  ? TUBAL LIGATION    ? VIDEO BRONCHOSCOPY Bilateral 11/20/2012  ? Procedure: VIDEO BRONCHOSCOPY WITH FLUORO;  Surgeon: Juanito Doom, MD;  [facility];  Service: Cardiopulmonary;  Laterality: Bilateral;  ? ? ? ?Current Outpatient Medications  ?Medication Sig Dispense Refill  ? acetaminophen (TYLENOL) 500 MG tablet Take 1,000 mg by mouth 2 (two) times daily as needed for moderate pain or headache.    ? albuterol (VENTOLIN HFA) 108 (90 Base) MCG/ACT inhaler Inhale 2 puffs into the  lungs every 6 (six) hours as needed for wheezing or shortness of breath. 18 g 2  ? arformoterol (BROVANA) 15 MCG/2ML NEBU Take 2 mLs (15 mcg total) by nebulization 2 (two) times daily. 120 mL 6  ? azelastine (ASTELIN) 0.1 % nasal spray Place 1 spray into both nostrils 2 (two) times daily. Use in each nostril as directed 30 mL 6  ? cetirizine (ZYRTEC) 10 MG tablet TAKE 1 TABLET BY MOUTH EVERY DAY (NOT COVERED 90 tablet 0  ? clopidogrel (PLAVIX) 75 MG tablet Take 1 tablet (75 mg total) by mouth in the morning. 90 tablet 1  ? diphenhydrAMINE (BENADRYL) 25 MG tablet Take 50 mg by mouth daily as needed for allergies (Asthma).    ? EPINEPHrine 0.3 mg/0.3 mL IJ SOAJ injection Inject 0.3 mg into the muscle as needed for anaphylaxis.     ? estradiol (ESTRACE) 1 MG tablet TAKE 1  TABLET BY MOUTH EVERY DAY 90 tablet 1  ? fluticasone (FLONASE) 50 MCG/ACT nasal spray Place 1 spray into both nostrils daily. 16 g 3  ? furosemide (LASIX) 20 MG tablet TAKE 1 TABLET BY MOUTH EVERY DAY AS

## 2021-07-26 ENCOUNTER — Encounter: Payer: Medicare Other | Admitting: Allergy & Immunology

## 2021-07-29 ENCOUNTER — Ambulatory Visit (INDEPENDENT_AMBULATORY_CARE_PROVIDER_SITE_OTHER): Payer: Medicare Other

## 2021-07-29 DIAGNOSIS — E538 Deficiency of other specified B group vitamins: Secondary | ICD-10-CM

## 2021-07-29 MED ORDER — CYANOCOBALAMIN 1000 MCG/ML IJ SOLN
1000.0000 ug | Freq: Once | INTRAMUSCULAR | Status: AC
  Administered 2021-07-29: 1000 ug via INTRAMUSCULAR

## 2021-07-29 NOTE — Progress Notes (Signed)
Patient presented for B 12 injection to left deltoid, patient voiced no concerns nor showed any signs of distress during injection. 

## 2021-08-11 ENCOUNTER — Other Ambulatory Visit: Payer: Self-pay | Admitting: Physical Medicine and Rehabilitation

## 2021-08-11 DIAGNOSIS — M5416 Radiculopathy, lumbar region: Secondary | ICD-10-CM

## 2021-08-19 ENCOUNTER — Other Ambulatory Visit: Payer: Medicare Other

## 2021-08-29 ENCOUNTER — Ambulatory Visit (INDEPENDENT_AMBULATORY_CARE_PROVIDER_SITE_OTHER): Payer: Medicare Other

## 2021-08-29 DIAGNOSIS — E538 Deficiency of other specified B group vitamins: Secondary | ICD-10-CM | POA: Diagnosis not present

## 2021-08-29 MED ORDER — CYANOCOBALAMIN 1000 MCG/ML IJ SOLN
1000.0000 ug | Freq: Once | INTRAMUSCULAR | Status: AC
  Administered 2021-08-29: 1000 ug via INTRAMUSCULAR

## 2021-08-29 NOTE — Progress Notes (Signed)
Patient presented for B 12 injection to right deltoid, patient voiced no concerns nor showed any signs of distress during injection. 

## 2021-08-31 ENCOUNTER — Encounter: Payer: Self-pay | Admitting: Pulmonary Disease

## 2021-08-31 ENCOUNTER — Ambulatory Visit (INDEPENDENT_AMBULATORY_CARE_PROVIDER_SITE_OTHER): Payer: Medicare Other | Admitting: Pulmonary Disease

## 2021-08-31 VITALS — BP 120/70 | HR 100 | Temp 97.7°F | Ht 63.5 in | Wt 100.6 lb

## 2021-08-31 DIAGNOSIS — J4540 Moderate persistent asthma, uncomplicated: Secondary | ICD-10-CM

## 2021-08-31 DIAGNOSIS — J84112 Idiopathic pulmonary fibrosis: Secondary | ICD-10-CM | POA: Diagnosis not present

## 2021-08-31 DIAGNOSIS — Z930 Tracheostomy status: Secondary | ICD-10-CM | POA: Diagnosis not present

## 2021-08-31 DIAGNOSIS — J84111 Idiopathic interstitial pneumonia, not otherwise specified: Secondary | ICD-10-CM

## 2021-08-31 NOTE — Patient Instructions (Signed)
For the cough over-the-counter try finding Actifed syrup or Triaminic children syrup.  These are usually very effective.  We will see you in follow-up in 2 to 3 months time call sooner should any new problems arise.

## 2021-08-31 NOTE — Progress Notes (Signed)
Subjective:    Patient ID: Anne [name], female    DOB: [birth year], 71 y.o.   MRN: [id number] Patient Care Team: [name], MD as PCP - General (Family Medicine) Wellington Hampshire, MD as PCP - Cardiology (Cardiology) Nickie Retort, MD (Inactive) as Consulting Physician (Urology) Pyrtle, Lajuan Lines, MD as Consulting Physician (Gastroenterology)  Chief Complaint  Patient presents with   Follow-up    SOB with incline and non prod cough.   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 No frank honeycombing. Findings have progressed since  2014. 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 Chest x-ray 05/14/2020: Severe pleural-parenchymal scarring unchanged from prior tracheostomy tube in good position. Alpha-1 antitrypsin 06/14/2020: Normal level at 105 (she has known PI MZ). 2D echo 07/07/2020: LVEF 55 to 60%.  No wall motion abnormalities, grade II DD, aortic regurgitation mild to moderate. Coronary CT 08/12/2020: Coronary calcium score 0.   INTERVAL HISTORY: [name] exchange 05/16/2021 by our [facility] in [place]. Last visit with me on 05/26/2021.  Evaluated 06/27/2021 by ENT at [facility].  Recommendation was to decannulate patient.  HPI Anne [name] is a 71 year old lifelong never smokernever smoker with a history of mild to moderate persistent asthma and idiopathic pleural-parenchymal fibroelastosis who presents for follow-up.  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  The patient has had longstanding issues with her tracheostomy. She was evaluated on 3 April by Dr. Denyse Dago, ENT at [facility] who after a thorough evaluation determined that the patient could likely be decannulated.  She had this recommended with an over night stay at the hospital however she decided that she did not want to stay overnight but she decannulated herself at home that evening.  She has not had any sequela.  She notes that she may have a persistent stoma due to the length of time that the trach has been in place.  Dr. Kathyrn Sheriff will perform surgery for stoma closure.  She does not endorse any issues today and actually has felt markedly better since her tracheostomy has been removed.  She is tolerating Brovana for her asthma.  Management of her asthma has been challenging due to her multiple medication sensitivities.  She has had a slight dry cough since her tracheostomy is out.  She does not endorse no other active complaint today, is thrilled that her tracheostomy is out.  Today she presents feeling well and looking well overall.   Review of Systems A 10 point review of systems was performed and it is as noted above otherwise negative.  Patient Active Problem List   Diagnosis Date Noted   Tracheostomy status ([place])    Tracheal granulation    Stress 03/11/2021   Neuropathy 08/17/2020   Scalp lesion 08/17/2020   History of COVID-19 04/19/2020   Ganglion cyst of wrist, right 04/19/2020   Nontraumatic nail splitting 04/19/2020   History of anaphylaxis 04/18/2019   Liver lesion 10/11/2018   S/P lumbar laminectomy 05/13/2018   Low back pain 02/07/2018   Iron deficiency anemia 12/11/2017   Mild persistent asthma 10/10/2017  Palpitations 08/03/2017   Bilateral leg edema 08/03/2017   Anxiety 08/03/2017   Current long-term use of postmenopausal hormone replacement therapy 03/19/2017   S/P cervical spinal  fusion 01/10/2017   Kidney lesion 12/13/2016   Migraine 09/12/2016   Esophageal dysphagia 06/19/2016   Skin nodule 01/10/2016   Hypokalemia 01/10/2016   Dysuria 12/15/2015   Degenerative arthritis of cervical spine 10/28/2015   Gastritis    B12 deficiency 03/15/2015   Abdominal bloating 09/03/2014   Slow transit constipation 09/03/2014   Menopausal symptom 09/03/2014   Vocal cord dysfunction 07/15/2014   History of TIA (transient ischemic attack) 06/03/2014   Chronic pain syndrome 04/28/2014   Fatigue 09/30/2013   Hypertensive lower esophageal sphincter 07/29/2013   Achalasia of esophagus 07/29/2013   Edema 07/01/2013   Chronic pulmonary aspiration 06/06/2013   Insomnia 12/13/2012   HLD (hyperlipidemia) 12/13/2012   Anemia 08/13/2012   Pulmonary fibrosis ([place]) 08/05/2012   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   Allergies  Allergen Reactions   Asa [Aspirin] Shortness Of Breath, Swelling and Other (See Comments)    Tongue swells   Bee Venom Anaphylaxis and Shortness Of Breath   Epinephrine Shortness Of Breath and Palpitations    Occurs when pt is given double the dose, pt would use epipen as needed    Feraheme [Ferumoxytol] Shortness Of Breath    Light headed    Influenza Vaccines Shortness Of Breath and Other (See Comments)    Can take flu vaccines without eggs    Pfizer-Biontech Covid-19 Vacc [Covid-19 Mrna Vacc (Moderna)] Anaphylaxis   Quinolones Swelling and Other (See Comments)    Feet swell   Shellfish Allergy Anaphylaxis   Ciprofloxacin Swelling and Other (See Comments)    ALL MEDS ENDING IN -FLOXACIN MAKE THE FEET SWELL   Clarithromycin Nausea Only    Abdomina pain   Erythromycin Nausea Only    Abdominal pain   Levaquin [Levofloxacin In D5w] Swelling and Other (See Comments)     Feet and legs ache and SWELL   Peanut-Containing Drug Products Other (See Comments)    Wheezing (boiled or raw peanuts)   Septra [Sulfamethoxazole-Trimethoprim] Diarrhea   Telithromycin Nausea Only    Abdominal pain   Zanaflex [Tizanidine] Other (See Comments)    Caused the patient to feel "spaced out" and "not well"   Adhesive [Tape] Rash and Other (See Comments)    Paper works tape works fine    Current Meds  Medication Sig   acetaminophen (TYLENOL) 500 MG tablet Take 1,000 mg by mouth 2 (two) times daily as needed for moderate pain or headache.   albuterol (VENTOLIN HFA) 108 (90 Base) MCG/ACT inhaler Inhale 2 puffs into the lungs every 6 (six) hours as needed for wheezing or shortness of breath.   azelastine (ASTELIN) 0.1 % nasal spray Place 1 spray into both nostrils 2 (two) times daily. Use in each nostril as directed   cetirizine (ZYRTEC) 10 MG tablet TAKE 1 TABLET BY MOUTH EVERY DAY (NOT COVERED   clopidogrel (PLAVIX) 75 MG tablet Take 1 tablet (75 mg total) by mouth in the morning.   diphenhydrAMINE (BENADRYL) 25 MG tablet Take 50 mg by mouth daily as needed for allergies (Asthma).   EPINEPHrine 0.3 mg/0.3 mL IJ SOAJ injection Inject 0.3 mg into  the muscle as needed for anaphylaxis.    estradiol (ESTRACE) 1 MG tablet TAKE 1 TABLET BY MOUTH EVERY DAY   fluticasone (FLONASE) 50 MCG/ACT nasal spray Place 1 spray into both nostrils daily.   furosemide (LASIX) 20 MG tablet TAKE 1 TABLET BY MOUTH EVERY DAY AS NEEDED   guaiFENesin-codeine 100-10 MG/5ML syrup Take 5 mLs by mouth 2 (two) times daily as needed for cough.   HYDROcodone-homatropine (HYCODAN) 5-1.5 MG/5ML syrup Take 5 mLs by mouth every 8 (eight) hours as needed for cough.   ibuprofen (ADVIL,MOTRIN) 200 MG tablet Take 400-800 mg by mouth daily as needed for headache or moderate pain.   ipratropium-albuterol (DUONEB) 0.5-2.5 (3) MG/3ML SOLN Take 3 mLs by nebulization every 4 (four) hours as needed. Dx 496   montelukast  (SINGULAIR) 10 MG tablet TAKE 1 TABLET BY MOUTH EVERYDAY AT BEDTIME   mupirocin ointment (BACTROBAN) 2 % Apply 1 application topically daily as needed (FOR [facility] SITE IRRITATION).   Naphazoline-Pheniramine 0.027-0.315 % SOLN Place 1 drop into both eyes daily as needed (allergies).    pantoprazole (PROTONIX) 40 MG tablet TAKE ONE TABLET EVERY DAY NEEDS FOR OFFICE USE VISIT FOR MORE REFILLS.   Pseudoephedrine HCl (SUDAFED PO) Take 1 tablet by mouth daily as needed (cold symptons).   rizatriptan (MAXALT-MLT) 10 MG disintegrating tablet TAKE 1 TABLET (10 MG TOTAL) BY MOUTH DAILY AS NEEDED FOR MIGRAINE. MAY REPEAT FOR ONE DOSE 2 HOURS AFTER THE FIRST ONE IF NEEDED.   sodium chloride (OCEAN) 0.65 % SOLN nasal spray Place 1 spray into both nostrils every 4 (four) hours as needed for congestion.    White Petrolatum-Mineral Oil (LUBRICANT EYE) OINT Place 1 application into both eyes 2 (two) times daily as needed (for dry eyes).    [DISCONTINUED] arformoterol (BROVANA) 15 MCG/2ML NEBU Take 2 mLs (15 mcg total) by nebulization 2 (two) times daily.   Immunization History  Administered Date(s) Administered   Fluad Quad(high Dose 65+) 02/14/2021   Influenza Inj Mdck Quad Pf 01/15/2019, 01/16/2020   Influenza, Quadrivalent, Recombinant, Inj, Pf 02/06/2017, 12/19/2017   Influenza,inj,Quad PF,6+ Mos 12/19/2012   Influenza,trivalent, recombinat, inj, PF 01/05/2014   Janssen (J&J) SARS-COV-2 Vaccination 01/05/2020, 03/10/2020   PFIZER(Purple Top)SARS-COV-2 Vaccination 05/09/2019   Pneumococcal Conjugate-13 01/23/2014   Pneumococcal Polysaccharide-23 03/11/2021       Objective:   Physical Exam BP 120/70 (BP Location: Left Arm, Cuff Size: Normal)   Pulse 100   Temp 97.7 F (36.5 C) (Temporal)   Ht 5' 3.5" (1.613 m)   Wt 100 lb 9.6 oz (45.6 kg)   SpO2 100%   BMI 17.54 kg/m  GENERAL: Well-developed, thin but not cachectic woman, in no acute distress.  She is fully ambulatory. HEAD: Normocephalic,  atraumatic. EYES: Pupils equal, round, reactive to light.  No scleral icterus.   MOUTH: Oral mucosa moist, no thrush NECK: Supple.Trachea midline. No JVD.  No adenopathy.  NO TRACHEOSTOMY!  Stoma clean. PULMONARY: Good air entry bilaterally.    Coarse breath sounds, no wheezes, no other adventitious sounds.   CARDIOVASCULAR: S1 and S2. Regular rate and rhythm.  No rubs, murmurs or gallops heard. ABDOMEN: Benign. MUSCULOSKELETAL: No joint deformity, no clubbing, no edema. NEUROLOGIC: No focal deficit noted.  No gait disturbance.  Phonates well occluding tracheal stoma. SKIN: Limited exam shows no rashes. PSYCH: Mood and behavior normal.    Assessment & Plan:     ICD-10-CM   1. Moderate persistent allergic asthma  J45.40    Continue Brovana and Singulair  Does not tolerate ICS    2. Idiopathic pleuroparenchymal fibroelastosis ([place])  J84.112    No evidence of progression Continue to follow expectantly    3. 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  We will see the patient in follow-up in 2 to 3 months time call sooner should any new problems arise.  Renold Don, MD Advanced Bronchoscopy PCCM [facility] [facility]    *This note was dictated using voice recognition software/Dragon.  Despite best efforts to proofread, errors can occur which can change the meaning. Any transcriptional errors that result from this process are unintentional and may not be fully corrected at the time of dictation.

## 2021-09-01 ENCOUNTER — Telehealth: Payer: Self-pay

## 2021-09-01 NOTE — Telephone Encounter (Signed)
I received a fax from Dr. Margaretha Sheffield and this patient is having surgery on 09/14/2021 and they would like clearance for patients to stop the Plavix 5 days prior to surgery and remain off 5 days after surgery.  I put the message in the lab basket for you.  Please advise.

## 2021-09-02 ENCOUNTER — Other Ambulatory Visit: Payer: Self-pay | Admitting: Family Medicine

## 2021-09-02 NOTE — Telephone Encounter (Signed)
I called and spoke with the patient and informed her how the provider wants her to do her Plavix for her upcoming surgery and she understood. Kariah Loredo,cma

## 2021-09-02 NOTE — Telephone Encounter (Signed)
Noted.  Please let them know it is okay for her to stop the Plavix for 5 days prior to the surgery and remain off of it for 5 days after.  I am seeing her next week and we can do her preop visit then.

## 2021-09-06 ENCOUNTER — Encounter: Payer: Self-pay | Admitting: Family Medicine

## 2021-09-06 ENCOUNTER — Other Ambulatory Visit: Payer: Medicare Other

## 2021-09-06 ENCOUNTER — Ambulatory Visit (INDEPENDENT_AMBULATORY_CARE_PROVIDER_SITE_OTHER): Payer: Medicare Other | Admitting: Family Medicine

## 2021-09-06 VITALS — BP 110/80 | HR 59 | Temp 98.0°F | Ht 63.0 in | Wt 102.4 lb

## 2021-09-06 DIAGNOSIS — Z8673 Personal history of transient ischemic attack (TIA), and cerebral infarction without residual deficits: Secondary | ICD-10-CM | POA: Diagnosis not present

## 2021-09-06 DIAGNOSIS — Z930 Tracheostomy status: Secondary | ICD-10-CM | POA: Diagnosis not present

## 2021-09-06 DIAGNOSIS — G8929 Other chronic pain: Secondary | ICD-10-CM

## 2021-09-06 DIAGNOSIS — E785 Hyperlipidemia, unspecified: Secondary | ICD-10-CM | POA: Diagnosis not present

## 2021-09-06 DIAGNOSIS — D509 Iron deficiency anemia, unspecified: Secondary | ICD-10-CM | POA: Diagnosis not present

## 2021-09-06 DIAGNOSIS — M545 Low back pain, unspecified: Secondary | ICD-10-CM

## 2021-09-06 DIAGNOSIS — M1990 Unspecified osteoarthritis, unspecified site: Secondary | ICD-10-CM | POA: Insufficient documentation

## 2021-09-06 DIAGNOSIS — Z79890 Hormone replacement therapy (postmenopausal): Secondary | ICD-10-CM

## 2021-09-06 DIAGNOSIS — J8410 Pulmonary fibrosis, unspecified: Secondary | ICD-10-CM

## 2021-09-06 LAB — IBC + FERRITIN
Ferritin: 54.5 ng/mL (ref 10.0–291.0)
Iron: 80 ug/dL (ref 42–145)
Saturation Ratios: 25.9 % (ref 20.0–50.0)
TIBC: 309.4 ug/dL (ref 250.0–450.0)
Transferrin: 221 mg/dL (ref 212.0–360.0)

## 2021-09-06 LAB — CBC
HCT: 36.8 % (ref 36.0–46.0)
Hemoglobin: 12.3 g/dL (ref 12.0–15.0)
MCHC: 33.5 g/dL (ref 30.0–36.0)
MCV: 97.5 fl (ref 78.0–100.0)
Platelets: 175 10*3/uL (ref 150.0–400.0)
RBC: 3.78 Mil/uL — ABNORMAL LOW (ref 3.87–5.11)
RDW: 12.7 % (ref 11.5–15.5)
WBC: 4.6 10*3/uL (ref 4.0–10.5)

## 2021-09-06 LAB — LIPID PANEL
Cholesterol: 184 mg/dL (ref 0–200)
HDL: 88.1 mg/dL (ref >39.00)
LDL Cholesterol: 80 mg/dL (ref 0–99)
NonHDL: 95.6
Total CHOL/HDL Ratio: 2
Triglycerides: 79 mg/dL (ref 0.0–149.0)
VLDL: 15.8 mg/dL (ref 0.0–40.0)

## 2021-09-06 LAB — COMPREHENSIVE METABOLIC PANEL
ALT: 10 U/L (ref 0–35)
AST: 16 U/L (ref 0–37)
Albumin: 4 g/dL (ref 3.5–5.2)
Alkaline Phosphatase: 99 U/L (ref 39–117)
BUN: 15 mg/dL (ref 6–23)
CO2: 30 mEq/L (ref 19–32)
Calcium: 9.5 mg/dL (ref 8.4–10.5)
Chloride: 105 mEq/L (ref 96–112)
Creatinine, Ser: 1.02 mg/dL (ref 0.40–1.20)
GFR: 55.16 mL/min — ABNORMAL LOW (ref >60.00)
Glucose, Bld: 76 mg/dL (ref 70–99)
Potassium: 3.8 mEq/L (ref 3.5–5.1)
Sodium: 141 mEq/L (ref 135–145)
Total Bilirubin: 0.5 mg/dL (ref 0.2–1.2)
Total Protein: 6.3 g/dL (ref 6.0–8.3)

## 2021-09-06 NOTE — Assessment & Plan Note (Signed)
Reinforced the potential long-term risks of this medication including stroke, heart attack, cancer, and blood clot.  The patient notes she benefits quite significantly from taking this medication and she notes the benefit would outweigh the risk for her.  She will continue her estradiol 1 mg daily.  She will be evaluated immediately if she develops any symptoms of stroke, heart attack, or blood clot.

## 2021-09-06 NOTE — Assessment & Plan Note (Signed)
" >>  ASSESSMENT AND PLAN FOR PULMONARY FIBROSIS (HCC) WRITTEN ON 09/06/2021  9:05 AM BY Kayston Jodoin G, MD  Generally stable.  She will continue to follow with pulmonology. "

## 2021-09-06 NOTE — Assessment & Plan Note (Signed)
Check labs 

## 2021-09-06 NOTE — Assessment & Plan Note (Signed)
Generally stable.  She will continue to follow with pulmonology.

## 2021-09-06 NOTE — Assessment & Plan Note (Signed)
Patient will continue chronic use of Plavix 75 mg daily.  She can come off of this for 5 days prior to her upcoming surgery in 5 days after.  She will monitor for signs of TIA and stroke and if those occur she will seek medical attention immediately.

## 2021-09-06 NOTE — Patient Instructions (Signed)
Nice to see you. We will contact you with lab results.

## 2021-09-06 NOTE — Assessment & Plan Note (Signed)
Patient has an upcoming surgery to fix her stoma site.  Her Lyndel Safe perioperative risk score is somewhere between 0.1% and 0.4%.  This places her at low cardiac risk for MI or cardiac arrest.  Her chronic lung issues are stable and she is following with pulmonology.

## 2021-09-06 NOTE — Assessment & Plan Note (Signed)
Chronic issue.  She will continue to follow with physical medicine and rehab.

## 2021-09-06 NOTE — Progress Notes (Signed)
Tommi Rumps, MD Phone: [phone]  Anne [name] is a 71 y.o. female who presents today for follow-up.  History of TIA: Patient notes TIA occurred many years ago.  She is been on Plavix since then.  She has tongue swelling if she takes aspirin.  She is scheduled to have her tracheostomy repaired and needs to come off the Plavix for that.  Pulmonary fibrosis: Patient reports her breathing is generally stable.  She follows with pulmonology and they are aware she is having a tracheostomy surgery.  She does get some shortness of breath when walking upstairs though nothing significant.  She notes this is stable.  She has no chest pain.  Palpitations: Patient notes her cardiologist took her off of her beta-blocker given that her heart rate was getting too low.  She has had some occasional palpitations since coming off of this.  Chronic back/joint pain: Patient notes she is following with physical medicine and rehab.  She is going to have an injection after she has her tracheostomy repair and see if that helps with her chronic back pain.  She does have some chronic joint pain.  She takes Tylenol on a daily basis.  She does intermittently take some NSAIDs and has taken Celebrex and tramadol in the past though notes those were not terribly beneficial.  Chronic estrogen use: Patient has been on this long-term.  In the past she has tried to come off of this and had bad hot flashes.  She does report her maternal grandmother and her mother's half-sister have a history of breast cancer.  The patient has not had breast cancer.  Social History   Tobacco Use  Smoking Status Never  Smokeless Tobacco Never    Current Outpatient Medications on File Prior to Visit  Medication Sig Dispense Refill   acetaminophen (TYLENOL) 500 MG tablet Take 1,000 mg by mouth 2 (two) times daily as needed for moderate pain or headache.     albuterol (VENTOLIN HFA) 108 (90 Base) MCG/ACT inhaler Inhale 2 puffs into the  lungs every 6 (six) hours as needed for wheezing or shortness of breath. 18 g 2   azelastine (ASTELIN) 0.1 % nasal spray Place 1 spray into both nostrils 2 (two) times daily. Use in each nostril as directed 30 mL 6   cetirizine (ZYRTEC) 10 MG tablet TAKE 1 TABLET BY MOUTH EVERY DAY (NOT COVERED 90 tablet 0   clopidogrel (PLAVIX) 75 MG tablet Take 1 tablet (75 mg total) by mouth in the morning. 90 tablet 1   diphenhydrAMINE (BENADRYL) 25 MG tablet Take 50 mg by mouth daily as needed for allergies (Asthma).     EPINEPHrine 0.3 mg/0.3 mL IJ SOAJ injection Inject 0.3 mg into the muscle as needed for anaphylaxis.      estradiol (ESTRACE) 1 MG tablet TAKE 1 TABLET BY MOUTH EVERY DAY 90 tablet 1   fluticasone (FLONASE) 50 MCG/ACT nasal spray Place 1 spray into both nostrils daily. 16 g 3   furosemide (LASIX) 20 MG tablet TAKE 1 TABLET BY MOUTH EVERY DAY AS NEEDED 90 tablet 1   guaiFENesin-codeine 100-10 MG/5ML syrup Take 5 mLs by mouth 2 (two) times daily as needed for cough. 120 mL 0   HYDROcodone-homatropine (HYCODAN) 5-1.5 MG/5ML syrup Take 5 mLs by mouth every 8 (eight) hours as needed for cough. 120 mL 0   ibuprofen (ADVIL,MOTRIN) 200 MG tablet Take 400-800 mg by mouth daily as needed for headache or moderate pain.     ipratropium-albuterol (DUONEB)  0.5-2.5 (3) MG/3ML SOLN Take 3 mLs by nebulization every 4 (four) hours as needed. Dx 496 120 mL 2   montelukast (SINGULAIR) 10 MG tablet TAKE 1 TABLET BY MOUTH EVERYDAY AT BEDTIME 90 tablet 1   mupirocin ointment (BACTROBAN) 2 % Apply 1 application topically daily as needed (FOR [facility] SITE IRRITATION).     Naphazoline-Pheniramine 0.027-0.315 % SOLN Place 1 drop into both eyes daily as needed (allergies).      pantoprazole (PROTONIX) 40 MG tablet TAKE ONE TABLET EVERY DAY NEEDS FOR OFFICE USE VISIT FOR MORE REFILLS. 90 tablet 2   Pseudoephedrine HCl (SUDAFED PO) Take 1 tablet by mouth daily as needed (cold symptons).     rizatriptan (MAXALT-MLT) 10 MG  disintegrating tablet TAKE 1 TABLET (10 MG TOTAL) BY MOUTH DAILY AS NEEDED FOR MIGRAINE. MAY REPEAT FOR ONE DOSE 2 HOURS AFTER THE FIRST ONE IF NEEDED. 10 tablet 0   sodium chloride (OCEAN) 0.65 % SOLN nasal spray Place 1 spray into both nostrils every 4 (four) hours as needed for congestion.      White Petrolatum-Mineral Oil (LUBRICANT EYE) OINT Place 1 application into both eyes 2 (two) times daily as needed (for dry eyes).      oxyCODONE (OXY IR/ROXICODONE) 5 MG immediate release tablet Take 5 mg by mouth 2 (two) times daily as needed.     [DISCONTINUED] zolmitriptan (ZOMIG-ZMT) 5 MG disintegrating tablet Take 1 tablet (5 mg total) by mouth daily as needed for migraine. 10 tablet 0   No current facility-administered medications on file prior to visit.     ROS see history of present illness  Objective  Physical Exam Vitals:   09/06/21 0834  BP: 110/80  Pulse: (!) 59  Temp: 98 F (36.7 C)  SpO2: 99%    BP Readings from Last 3 Encounters:  09/06/21 110/80  08/31/21 120/70  07/19/21 134/60   Wt Readings from Last 3 Encounters:  09/06/21 102 lb 6.4 oz (46.4 kg)  08/31/21 100 lb 9.6 oz (45.6 kg)  07/19/21 101 lb (45.8 kg)    Physical Exam Constitutional:      General: She is not in acute distress.    Appearance: She is not diaphoretic.  Cardiovascular:     Rate and Rhythm: Normal rate and regular rhythm.     Heart sounds: Normal heart sounds.  Pulmonary:     Effort: Pulmonary effort is normal.     Breath sounds: Normal breath sounds.  Skin:    General: Skin is warm and dry.  Neurological:     Mental Status: She is alert.      Assessment/Plan: Please see individual problem list.  Problem List Items Addressed This Visit     Arthritis (Chronic)    I suspect her chronic joint pain is osteoarthritis related.  She will continue over-the-counter Tylenol.  Discussed that NSAIDs could be used sparingly though are not a great chronic option given that she is on Plavix.       Relevant Medications   oxyCODONE (OXY IR/ROXICODONE) 5 MG immediate release tablet   Current long-term use of postmenopausal hormone replacement therapy (Chronic)    Reinforced the potential long-term risks of this medication including stroke, heart attack, cancer, and blood clot.  The patient notes she benefits quite significantly from taking this medication and she notes the benefit would outweigh the risk for her.  She will continue her estradiol 1 mg daily.  She will be evaluated immediately if she develops any symptoms of stroke, heart  attack, or blood clot.      History of TIA (transient ischemic attack) (Chronic)    Patient will continue chronic use of Plavix 75 mg daily.  She can come off of this for 5 days prior to her upcoming surgery in 5 days after.  She will monitor for signs of TIA and stroke and if those occur she will seek medical attention immediately.      Low back pain (Chronic)    Chronic issue.  She will continue to follow with physical medicine and rehab.      Relevant Medications   oxyCODONE (OXY IR/ROXICODONE) 5 MG immediate release tablet   Pulmonary fibrosis (HCC) (Chronic)    Generally stable.  She will continue to follow with pulmonology.      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  Her Lyndel Safe perioperative risk score is somewhere between 0.1% and 0.4%.  This places her at low cardiac risk for MI or cardiac arrest.  Her chronic lung issues are stable and she is following with pulmonology.      HLD (hyperlipidemia)   Relevant Orders   Lipid panel   Comp Met (CMET)   Iron deficiency anemia - Primary    Check labs.      Relevant Orders   CBC   IBC + Ferritin     Return in about 6 months (around 03/08/2022).   Tommi Rumps, MD [place]

## 2021-09-06 NOTE — Assessment & Plan Note (Signed)
I suspect her chronic joint pain is osteoarthritis related.  She will continue over-the-counter Tylenol.  Discussed that NSAIDs could be used sparingly though are not a great chronic option given that she is on Plavix.

## 2021-09-07 ENCOUNTER — Encounter
Admission: RE | Admit: 2021-09-07 | Discharge: 2021-09-07 | Disposition: A | Discharge status: Home / Self Care | Payer: Medicare Other | Source: Ambulatory Visit | Attending: Otolaryngology | Admitting: Otolaryngology

## 2021-09-07 HISTORY — DX: Achalasia of cardia: K22.0

## 2021-09-07 HISTORY — DX: Allergy to other foods: Z91.018

## 2021-09-07 HISTORY — DX: Liver disease, unspecified: K76.9

## 2021-09-07 HISTORY — DX: Chronic pain syndrome: G89.4

## 2021-09-07 HISTORY — DX: Unspecified osteoarthritis, unspecified site: M19.90

## 2021-09-07 HISTORY — DX: Malignant neoplasm of unspecified kidney, except renal pelvis: C64.9

## 2021-09-07 HISTORY — DX: Localized edema: R60.0

## 2021-09-07 HISTORY — DX: Gastritis, unspecified, without bleeding: K29.70

## 2021-09-07 HISTORY — DX: Anxiety disorder, unspecified: F41.9

## 2021-09-07 HISTORY — DX: Other specified diseases of upper respiratory tract: J39.8

## 2021-09-07 HISTORY — DX: Hypokalemia: E87.6

## 2021-09-07 HISTORY — DX: Unspecified convulsions: R56.9

## 2021-09-07 HISTORY — DX: Unspecified foreign body in respiratory tract, part unspecified causing other injury, initial encounter: T17.908A

## 2021-09-07 NOTE — Patient Instructions (Signed)
Your procedure is scheduled on:09-14-21 Wednesday Report to the Registration Desk on the 1st floor of the [facility].Then proceed to the 2nd floor Surgery Desk To find out your arrival time, please call [phone] between 1PM - 3PM on:09-13-21 Tuesday If your arrival time is 6:00 am, do not arrive prior to that time as the [facility] entrance doors do not open until 6:00 am.  REMEMBER: Instructions that are not followed completely may result in serious medical risk, up to and including death; or upon the discretion of your surgeon and anesthesiologist your surgery may need to be rescheduled.  Do not eat food after midnight the night before surgery.  No gum chewing, lozengers or hard candies.  You may however, drink CLEAR liquids up to 2 hours before you are scheduled to arrive for your surgery. Do not drink anything within 2 hours of your scheduled arrival time.  Clear liquids include: - water  - apple juice without pulp - gatorade (not RED colors) - black coffee or tea (Do NOT add milk or creamers to the coffee or tea) Do NOT drink anything that is not on this list.  TAKE THESE MEDICATIONS THE MORNING OF SURGERY WITH A SIP OF WATER: -pantoprazole (PROTONIX)-take one the night before and one on the morning of surgery - helps to prevent nausea after surgery.)  Use your albuterol (VENTOLIN HFA) Inhaler the day of surgery and bring your Albuterol Inhaler to the hospital  Stop your clopidogrel (PLAVIX) 5 days prior to surgery-Last dose on 09-08-21 Thursday  One week prior to surgery: Stop Anti-inflammatories (NSAIDS) such as Advil, Aleve, Ibuprofen, Motrin, Naproxen, Naprosyn and Aspirin based products such as Excedrin, Goodys Powder, BC Powder.You may however, take Tylenol if needed for pain up until the day of surgery. Stop ANY OVER THE COUNTER supplements/vitamins NOW (09-07-21) until after surgery.   No Alcohol for 24 hours before or after surgery.  No Smoking including  e-cigarettes for 24 hours prior to surgery.  No chewable tobacco products for at least 6 hours prior to surgery.  No nicotine patches on the day of surgery.  Do not use any "recreational" drugs for at least a week prior to your surgery.  Please be advised that the combination of cocaine and anesthesia may have negative outcomes, up to and including death. If you test positive for cocaine, your surgery will be cancelled.  On the morning of surgery brush your teeth with toothpaste and water, you may rinse your mouth with mouthwash if you wish. Do not swallow any toothpaste or mouthwash.  Use CHG Soap as directed on instruction sheet.  Do not wear jewelry, make-up, hairpins, clips or nail polish.  Do not wear lotions, powders, or perfumes.   Do not shave body from the neck down 48 hours prior to surgery just in case you cut yourself which could leave a site for infection.  Also, freshly shaved skin may become irritated if using the CHG soap.  Contact lenses, hearing aids and dentures may not be worn into surgery.  Do not bring valuables to the hospital. [facility] is not responsible for any missing/lost belongings or valuables.   Notify your doctor if there is any change in your medical condition (cold, fever, infection).  Wear comfortable clothing (specific to your surgery type) to the hospital.  After surgery, you can help prevent lung complications by doing breathing exercises.  Take deep breaths and cough every 1-2 hours. Your doctor may order a device called an Chiropodist  to help you take deep breaths. When coughing or sneezing, hold a pillow firmly against your incision with both hands. This is called "splinting." Doing this helps protect your incision. It also decreases belly discomfort.  If you are being admitted to the hospital overnight, leave your suitcase in the car. After surgery it may be brought to your room.  If you are being discharged the day of surgery,  you will not be allowed to drive home. You will need a responsible adult (18 years or older) to drive you home and stay with you that night.   If you are taking public transportation, you will need to have a responsible adult (18 years or older) with you. Please confirm with your physician that it is acceptable to use public transportation.   Please call the [facility] at [phone] if you have any questions about these instructions.  Surgery Visitation Policy:  Patients undergoing a surgery or procedure may have two family members or support persons with them as long as the person is not COVID-19 positive or experiencing its symptoms.

## 2021-09-09 ENCOUNTER — Ambulatory Visit: Payer: Medicare Other | Admitting: Family Medicine

## 2021-09-13 ENCOUNTER — Encounter: Payer: Medicare Other | Admitting: Allergy & Immunology

## 2021-09-13 MED ORDER — LACTATED RINGERS IV SOLN: INTRAVENOUS | Status: DC

## 2021-09-14 ENCOUNTER — Other Ambulatory Visit: Payer: Self-pay

## 2021-09-14 ENCOUNTER — Ambulatory Visit: Payer: Medicare Other

## 2021-09-14 ENCOUNTER — Encounter: Payer: Self-pay | Admitting: Otolaryngology

## 2021-09-14 ENCOUNTER — Ambulatory Visit
Admission: RE | Admit: 2021-09-14 | Discharge: 2021-09-14 | Disposition: A | Discharge status: Home / Self Care | Payer: Medicare Other | Source: Ambulatory Visit | Attending: Otolaryngology | Admitting: Otolaryngology

## 2021-09-14 ENCOUNTER — Encounter
Admission: RE | Disposition: A | Discharge status: Home / Self Care | Payer: Self-pay | Source: Ambulatory Visit | Attending: Otolaryngology

## 2021-09-14 DIAGNOSIS — K449 Diaphragmatic hernia without obstruction or gangrene: Secondary | ICD-10-CM | POA: Diagnosis not present

## 2021-09-14 DIAGNOSIS — J9504 Tracheo-esophageal fistula following tracheostomy: Secondary | ICD-10-CM | POA: Insufficient documentation

## 2021-09-14 DIAGNOSIS — Z8673 Personal history of transient ischemic attack (TIA), and cerebral infarction without residual deficits: Secondary | ICD-10-CM | POA: Diagnosis not present

## 2021-09-14 DIAGNOSIS — J449 Chronic obstructive pulmonary disease, unspecified: Secondary | ICD-10-CM | POA: Diagnosis not present

## 2021-09-14 HISTORY — PX: TRACHEOSTOMY REVISION: SHX6133

## 2021-09-14 SURGERY — REVISION, STOMA, TRACHEA
Anesthesia: General | Site: Throat

## 2021-09-14 MED ORDER — FENTANYL CITRATE (PF) 100 MCG/2ML IJ SOLN
INTRAMUSCULAR | Status: AC
  Administered 2021-09-14: 25 ug via INTRAVENOUS
  Filled 2021-09-14: qty 2

## 2021-09-14 MED ORDER — PROPOFOL 1000 MG/100ML IV EMUL
INTRAVENOUS | Status: AC
  Filled 2021-09-14: qty 100

## 2021-09-14 MED ORDER — LIDOCAINE-EPINEPHRINE (PF) 1 %-1:200000 IJ SOLN
INTRAMUSCULAR | Status: AC
  Filled 2021-09-14: qty 30

## 2021-09-14 MED ORDER — DEXAMETHASONE SODIUM PHOSPHATE 10 MG/ML IJ SOLN
INTRAMUSCULAR | Status: DC | PRN
  Administered 2021-09-14: 10 mg via INTRAVENOUS

## 2021-09-14 MED ORDER — FENTANYL CITRATE (PF) 100 MCG/2ML IJ SOLN
INTRAMUSCULAR | Status: AC
  Filled 2021-09-14: qty 2

## 2021-09-14 MED ORDER — BACITRACIN ZINC 500 UNIT/GM EX OINT
TOPICAL_OINTMENT | CUTANEOUS | Status: AC
  Filled 2021-09-14: qty 28.35

## 2021-09-14 MED ORDER — PROPOFOL 10 MG/ML IV BOLUS
INTRAVENOUS | Status: DC | PRN
  Administered 2021-09-14: 50 mg via INTRAVENOUS
  Administered 2021-09-14: 100 mg via INTRAVENOUS
  Administered 2021-09-14: 20 mg via INTRAVENOUS

## 2021-09-14 MED ORDER — DEXMEDETOMIDINE (PRECEDEX) IN NS 20 MCG/5ML (4 MCG/ML) IV SYRINGE
PREFILLED_SYRINGE | INTRAVENOUS | Status: DC | PRN
  Administered 2021-09-14 (×2): 8 ug via INTRAVENOUS

## 2021-09-14 MED ORDER — EPHEDRINE SULFATE (PRESSORS) 50 MG/ML IJ SOLN
INTRAMUSCULAR | Status: DC | PRN
  Administered 2021-09-14: 5 mg via INTRAVENOUS

## 2021-09-14 MED ORDER — CHLORHEXIDINE GLUCONATE 0.12 % MT SOLN
OROMUCOSAL | Status: AC
  Filled 2021-09-14: qty 15

## 2021-09-14 MED ORDER — SUCCINYLCHOLINE CHLORIDE 200 MG/10ML IV SOSY
PREFILLED_SYRINGE | INTRAVENOUS | Status: DC | PRN
  Administered 2021-09-14: 80 mg via INTRAVENOUS

## 2021-09-14 MED ORDER — 0.9 % SODIUM CHLORIDE (POUR BTL) OPTIME
TOPICAL | Status: DC | PRN
  Administered 2021-09-14: 500 mL

## 2021-09-14 MED ORDER — LIDOCAINE HCL (CARDIAC) PF 100 MG/5ML IV SOSY
PREFILLED_SYRINGE | INTRAVENOUS | Status: DC | PRN
  Administered 2021-09-14: 40 mg via INTRAVENOUS
  Administered 2021-09-14: 60 mg via INTRAVENOUS

## 2021-09-14 MED ORDER — FENTANYL CITRATE (PF) 100 MCG/2ML IJ SOLN
INTRAMUSCULAR | Status: DC | PRN
  Administered 2021-09-14 (×2): 50 ug via INTRAVENOUS

## 2021-09-14 MED ORDER — ORAL CARE MOUTH RINSE: 15.0000 mL | Freq: Once | OROMUCOSAL | Status: DC

## 2021-09-14 MED ORDER — ONDANSETRON HCL 4 MG/2ML IJ SOLN
INTRAMUSCULAR | Status: DC | PRN
  Administered 2021-09-14: 4 mg via INTRAVENOUS

## 2021-09-14 MED ORDER — LIDOCAINE-EPINEPHRINE (PF) 1 %-1:200000 IJ SOLN
INTRAMUSCULAR | Status: DC | PRN
  Administered 2021-09-14: 5 mL

## 2021-09-14 MED ORDER — CEPHALEXIN 500 MG PO CAPS: 500.0000 mg | ORAL_CAPSULE | Freq: Two times a day (BID) | ORAL | 0 refills | Status: DC

## 2021-09-14 MED ORDER — FENTANYL CITRATE (PF) 100 MCG/2ML IJ SOLN
25.0000 ug | INTRAMUSCULAR | Status: DC | PRN
  Administered 2021-09-14 (×3): 25 ug via INTRAVENOUS

## 2021-09-14 MED ORDER — PROPOFOL 500 MG/50ML IV EMUL
INTRAVENOUS | Status: DC | PRN
  Administered 2021-09-14: 100 ug/kg/min via INTRAVENOUS

## 2021-09-14 SURGICAL SUPPLY — 23 items
BLADE SURG 15 STRL LF DISP TIS (BLADE) ×1 IMPLANT
BLADE SURG 15 STRL SS (BLADE) ×2
DRSG TEGADERM 2-3/8X2-3/4 SM (GAUZE/BANDAGES/DRESSINGS) ×1 IMPLANT
DRSG TELFA 3X8 NADH (GAUZE/BANDAGES/DRESSINGS) ×2 IMPLANT
ELECT REM PT RETURN 9FT ADLT (ELECTROSURGICAL) ×2
ELECTRODE REM PT RTRN 9FT ADLT (ELECTROSURGICAL) ×1 IMPLANT
GAUZE 4X4 16PLY ~~LOC~~+RFID DBL (SPONGE) ×2 IMPLANT
GAUZE SPONGE 4X4 12PLY STRL (GAUZE/BANDAGES/DRESSINGS) ×2 IMPLANT
GLOVE PROTEXIS LATEX SZ 7.5 (GLOVE) ×2 IMPLANT
GLOVE SURG LATEX 7.5 PF (GLOVE) ×1 IMPLANT
GOWN STRL REUS W/ TWL LRG LVL3 (GOWN DISPOSABLE) ×2 IMPLANT
GOWN STRL REUS W/TWL LRG LVL3 (GOWN DISPOSABLE) ×4
KIT TURNOVER KIT A (KITS) ×2 IMPLANT
LABEL OR SOLS (LABEL) ×2 IMPLANT
MANIFOLD NEPTUNE II (INSTRUMENTS) ×2 IMPLANT
NS IRRIG 500ML POUR BTL (IV SOLUTION) ×2 IMPLANT
PACK HEAD/NECK (MISCELLANEOUS) ×2 IMPLANT
PAD DRESSING TELFA 3X8 NADH (GAUZE/BANDAGES/DRESSINGS) IMPLANT
SPONGE KITTNER 5P (MISCELLANEOUS) ×2 IMPLANT
SUT ETHILON 5 0 P 3 18 (SUTURE) ×2
SUT NYLON ETHILON 5-0 P-3 1X18 (SUTURE) ×1 IMPLANT
SUT VIC AB 4-0 RB1 18 (SUTURE) ×1 IMPLANT
SUT VIC AB 5-0 PS2 18 (SUTURE) ×2 IMPLANT

## 2021-09-14 NOTE — Discharge Instructions (Signed)

## 2021-09-14 NOTE — Anesthesia Procedure Notes (Signed)
Procedure Name: Intubation Date/Time: 09/14/2021 8:31 AM  Performed by: Esaw Grandchild, CRNAPre-anesthesia Checklist: Patient identified, Emergency Drugs available, Suction available and Patient being monitored Patient Re-evaluated:Patient Re-evaluated prior to induction Oxygen Delivery Method: Circle system utilized Preoxygenation: Pre-oxygenation with 100% oxygen Induction Type: IV induction Ventilation: Mask ventilation without difficulty Laryngoscope Size: McGraph and 3 Grade View: Grade I Tube type: Oral Tube size: 6.5 mm Number of attempts: 1 Airway Equipment and Method: Stylet and Oral airway Placement Confirmation: ETT inserted through vocal cords under direct vision, positive ETCO2 and breath sounds checked- equal and bilateral Secured at: 18 cm Tube secured with: Tape Dental Injury: Teeth and Oropharynx as per pre-operative assessment

## 2021-09-14 NOTE — H&P (Signed)
H&P has been reviewed and patient reevaluated, no changes necessary. To be downloaded later.  

## 2021-09-14 NOTE — Transfer of Care (Signed)
Immediate Anesthesia Transfer of Care Note  Patient: Anne [name]  Procedure(s) Performed: TRACHEOSTOMA REVISION, TRACHEOPLASTY (Throat)  Patient Location: PACU  Anesthesia Type:General  Level of Consciousness: awake, alert  and oriented  Airway & Oxygen Therapy: Patient Spontanous Breathing and Patient connected to nasal cannula oxygen  Post-op Assessment: Report given to RN and Post -op Vital signs reviewed and stable  Post vital signs: Reviewed and stable  Last Vitals:  Vitals Value Taken Time  BP 107/44 09/14/21 0926  Temp 36.1 C 09/14/21 0926  Pulse 71 09/14/21 0928  Resp 12 09/14/21 0928  SpO2 100 % 09/14/21 0928    Last Pain:  Vitals:   09/14/21 0702  TempSrc: Temporal  PainSc: 0-No pain         Complications: No notable events documented.

## 2021-09-14 NOTE — Op Note (Signed)
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  Advancement flap closure of wound.  Surg:  Anne Alas Natan Hartog  Anes:  GOT  EBL: Minimal  Comp: None  Findings: Patient still had 1/4 inch hole in the trach stoma that was completely skin lined.  There is scarring of the skin around the stoma and much of the inferior scarring was freed up and removed.  An anterior flap of soft tissue was advanced to close over the stomal hole followed by advancement of the skin to close the hole as well.  Procedure: Patient was brought to the operating room and placed in the supine position.  She was given general anesthesia by oral endotracheal intubation.  The oral endotracheal tube cuff was placed beneath the trach stomal opening.  Once the patient was asleep the skin was marked around the trach stoma for a horizontal incision that was directly across the superior end of the opening and then a wide elliptical flap inferiorly to remove much of the scar tissue here.  She was then injected with local anesthesia using 1% Xylocaine with epi 1: 100,000.  Approximately 5 mL was infiltrated into the skin around the wound.  She was prepped and draped in a sterile fashion.  The incisions were created in the skin to remove much of a smile shape flap of skin that was around the stomal opening.  The skin was left intact at the stomal opening with a very small cuff of skin.  The skin was then elevated superiorly and inferiorly and laterally on both sides in the subcu plane.  The stomal edge had the skin loosened up and inverted into the hole and a pursestring suture of 5-0 Vicryl was used around the stomal opening to invert the skin edges inside and closed the stomal opening.  Once this was closed lateral flaps were elevated of some of the scar tissue and strap muscles and these were then  advanced to the midline and sutured in a vertical fashion with interrupted 5-0 Vicryl's.  The skin flaps were then advanced to the midline and 5-0 Vicryl's were used to close the dermis in a horizontal fashion overlying the wound.  The skin edges were then held in apposition using a 5-0 nylon in a running locking suture.  A small amount of ointment was then placed over the wound followed by Telfa and a Tegaderm dressing.  The patient tolerated the procedure very well.  She was awakened and extubated and taken to the recovery room in satisfactory condition.  There were no operative complications.  Dispo:   To PACU to be discharged home  Plan: To follow-up in the office in 1 week for suture removal.  She will rest at home and can change the dressing as needed but will leave a fresh dressing on this 24 hours a day until she is seen back in the office.  Anne [name]  09/14/2021 9:12 AM

## 2021-09-14 NOTE — Anesthesia Preprocedure Evaluation (Signed)
Anesthesia Evaluation  Patient identified by MRN, date of birth, ID band Patient awake    Reviewed: Allergy & Precautions, H&P , NPO status , Patient's Chart, lab work & pertinent test results, reviewed documented beta blocker date and time   History of Anesthesia Complications (+) PONV and history of anesthetic complications  Airway Mallampati: I  TM Distance: >3 FB Neck ROM: full    Dental  (+) Dental Advidsory Given, Partial Upper, Partial Lower, Missing   Pulmonary shortness of breath and with exertion, asthma , COPD, neg recent URI,    Pulmonary exam normal breath sounds clear to auscultation       Cardiovascular Exercise Tolerance: Good negative cardio ROS Normal cardiovascular exam Rhythm:regular Rate:Normal     Neuro/Psych Seizures - (as a child, none in 61 years),  PSYCHIATRIC DISORDERS Anxiety TIA   GI/Hepatic Neg liver ROS, hiatal hernia,   Endo/Other  negative endocrine ROS  Renal/GU Renal disease (lesion s/p ablation)  negative genitourinary   Musculoskeletal   Abdominal   Peds  Hematology negative hematology ROS (+)   Anesthesia Other Findings Past Medical History: No date: Achalasia of esophagus No date: Allergic rhinitis No date: Allergy     Comment:  multiple, mostly aspirin, levaquin and shellfish. No date: Anemia No date: Anxiety No date: Arthritis No date: Asthma No date: Bilateral leg edema 2014: Cataract     Comment:  exraction with len implant No date: Chronic pain syndrome No date: Chronic pulmonary aspiration No date: Complication of anesthesia     Comment:  Breathing problems upon waking up. 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 02/20/17- last time no problem. No date: Compressed cervical disc No date: COPD (chronic obstructive pulmonary disease) (HCC) No date: CVA (cerebral infarction) No date: Dyspnea No date: Esophageal dysmotility No date: Gastritis No date: H/O  food anaphylaxis No date: Heart murmur     Comment:  as child No date: History of hiatal hernia No date: Hypokalemia No date: IBS (irritable bowel syndrome) 2018: Kidney lesion     Comment:  Left No date: Liver lesion No date: Migraine 02/20/2017: PICC (peripherally inserted central catheter) removal No date: PONV (postoperative nausea and vomiting) No date: Problems with swallowing     Comment:  intermittently No date: Pulmonary fibrosis (HCC) No date: Renal cell carcinoma (HCC)     Comment:  small- left kidney No date: Seizures ([place])     Comment:  only in elementary school-was on medication and was tx               up until 5th grade and has had no more seizures since               then No date: Shingles No date: Stroke [facility])     Comment:  slurred speech, drawn face, imaging normal, occurred               twice, [facility]-"TIA" if antything" 02/20/17-no residual               effects No date: Tracheal granulation No date: Tracheostomy in place [facility]) 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   Reproductive/Obstetrics negative OB ROS                             Anesthesia Physical Anesthesia Plan  ASA: 2  Anesthesia Plan: General   Post-op Pain Management:    Induction:  Intravenous  PONV Risk Score and Plan: Propofol infusion, TIVA and Ondansetron  Airway Management Planned: Oral ETT  Additional Equipment:   Intra-op Plan:   Post-operative Plan:   Informed Consent: I have reviewed the patients History and Physical, chart, labs and discussed the procedure including the risks, benefits and alternatives for the proposed anesthesia with the patient or authorized representative who has indicated his/her understanding and acceptance.     Dental Advisory Given  Plan Discussed with: Anesthesiologist, CRNA and Surgeon  Anesthesia Plan Comments:         Anesthesia Quick Evaluation

## 2021-09-18 ENCOUNTER — Other Ambulatory Visit: Payer: Self-pay | Admitting: Family Medicine

## 2021-09-20 ENCOUNTER — Encounter: Payer: Self-pay | Admitting: Family Medicine

## 2021-09-21 ENCOUNTER — Other Ambulatory Visit: Payer: Self-pay | Admitting: Pulmonary Disease

## 2021-09-22 ENCOUNTER — Telehealth: Payer: Self-pay | Admitting: Pulmonary Disease

## 2021-09-22 MED ORDER — ALBUTEROL SULFATE HFA 108 (90 BASE) MCG/ACT IN AERS: 2.0000 | INHALATION_SPRAY | Freq: Four times a day (QID) | RESPIRATORY_TRACT | 3 refills | Status: AC | PRN

## 2021-09-22 NOTE — Telephone Encounter (Signed)
Rx for pt's albuterol inhaler has been sent to pharmacy for pt. Called and spoke with pt letting her know this had been done and she verbalized understanding. Nothing further needed. 

## 2021-09-28 ENCOUNTER — Ambulatory Visit (INDEPENDENT_AMBULATORY_CARE_PROVIDER_SITE_OTHER): Payer: Medicare Other

## 2021-09-28 DIAGNOSIS — E538 Deficiency of other specified B group vitamins: Secondary | ICD-10-CM | POA: Diagnosis not present

## 2021-09-28 MED ORDER — CYANOCOBALAMIN 1000 MCG/ML IJ SOLN
1000.0000 ug | Freq: Once | INTRAMUSCULAR | Status: AC
  Administered 2021-09-28: 1000 ug via INTRAMUSCULAR

## 2021-09-28 NOTE — Progress Notes (Signed)
Patient presented for B 12 injection to right deltoid, patient voiced no concerns nor showed any signs of distress during injection. 

## 2021-10-03 ENCOUNTER — Other Ambulatory Visit: Payer: Self-pay | Admitting: Family Medicine

## 2021-10-03 ENCOUNTER — Other Ambulatory Visit: Payer: Self-pay | Admitting: Cardiovascular Disease

## 2021-10-11 ENCOUNTER — Encounter: Payer: Medicare Other | Admitting: Allergy & Immunology

## 2021-10-31 ENCOUNTER — Ambulatory Visit: Payer: Medicare Other

## 2021-11-01 ENCOUNTER — Ambulatory Visit (INDEPENDENT_AMBULATORY_CARE_PROVIDER_SITE_OTHER): Payer: Medicare Other

## 2021-11-01 DIAGNOSIS — E538 Deficiency of other specified B group vitamins: Secondary | ICD-10-CM | POA: Diagnosis not present

## 2021-11-01 MED ORDER — CYANOCOBALAMIN 1000 MCG/ML IJ SOLN
1000.0000 ug | Freq: Once | INTRAMUSCULAR | Status: AC
  Administered 2021-11-01: 1000 ug via INTRAMUSCULAR

## 2021-11-01 NOTE — Progress Notes (Signed)
Pt arrived for B12 injection, given in L deltoid. Pt tolerated injection well, showed no signs of distress nor voiced any concerns.  ?

## 2021-11-02 ENCOUNTER — Encounter: Payer: Self-pay | Admitting: Family Medicine

## 2021-11-02 ENCOUNTER — Other Ambulatory Visit: Payer: Self-pay | Admitting: Family Medicine

## 2021-11-02 DIAGNOSIS — N951 Menopausal and female climacteric states: Secondary | ICD-10-CM

## 2021-11-03 ENCOUNTER — Other Ambulatory Visit: Payer: Self-pay

## 2021-11-03 NOTE — Patient Outreach (Signed)
  Care Coordination   Initial Visit Note   11/03/2021 Name: JYL CHICO MRN: [id number] DOB: 28-Mar-1950  ARITHA HUCKEBA is a 71 y.o. year old female who sees [name], Angela Adam, MD for primary care. I spoke with  [name] by phone today  What matters to the patients health and wellness today?  Patient states she doesn't have any needs at this time. She states she is managing her health conditions and is able to obtain her medications and has transportation to her appointments.     Goals Addressed             This Visit's Progress    Health maintenance       Care Coordination Interventions: Discussed with patient importance of Annual wellness visit Advised patient to call office to schedule Annual wellness visit.           SDOH assessments and interventions completed:  Yes     Care Coordination Interventions Activated:  Yes  Care Coordination Interventions:  Yes, provided   Follow up plan: No further intervention required.   Encounter Outcome:  Pt. Visit Completed   Quinn Plowman RN,BSN,CCM [facility] Coordinator [phone]

## 2021-11-03 NOTE — Patient Instructions (Signed)
Visit Information  Thank you for taking time to visit with me today. Please don't hesitate to contact me if I can be of assistance to you.   Following are the goals we discussed today:   Goals Addressed             This Visit's Progress    Health maintenance       Care Coordination Interventions: Discussed with patient importance of Annual wellness visit Advised patient to call office to schedule Annual wellness visit.            If you are experiencing a Mental Health or [place] or need someone to talk to, please call the Suicide and Crisis Lifeline: 988 call 1-800-273-TALK (toll free, 24 hour hotline)  Patient verbalizes understanding of instructions and agrees to view in [place]. Active MyChart status and patient understanding of how to access instructions and care plan via MyChart confirmed with patient.     No further follow up required today

## 2021-11-07 ENCOUNTER — Encounter: Payer: Self-pay | Admitting: Family Medicine

## 2021-11-07 ENCOUNTER — Ambulatory Visit (INDEPENDENT_AMBULATORY_CARE_PROVIDER_SITE_OTHER): Payer: Medicare Other | Admitting: Family Medicine

## 2021-11-07 VITALS — BP 120/70 | HR 65 | Temp 97.8°F | Ht 63.0 in | Wt 101.0 lb

## 2021-11-07 DIAGNOSIS — L0390 Cellulitis, unspecified: Secondary | ICD-10-CM | POA: Diagnosis not present

## 2021-11-07 DIAGNOSIS — S51801A Unspecified open wound of right forearm, initial encounter: Secondary | ICD-10-CM

## 2021-11-07 DIAGNOSIS — S81802A Unspecified open wound, left lower leg, initial encounter: Secondary | ICD-10-CM | POA: Insufficient documentation

## 2021-11-07 DIAGNOSIS — S51809A Unspecified open wound of unspecified forearm, initial encounter: Secondary | ICD-10-CM | POA: Insufficient documentation

## 2021-11-07 DIAGNOSIS — M542 Cervicalgia: Secondary | ICD-10-CM | POA: Insufficient documentation

## 2021-11-07 DIAGNOSIS — Z23 Encounter for immunization: Secondary | ICD-10-CM

## 2021-11-07 MED ORDER — CEFADROXIL 500 MG PO CAPS: 500.0000 mg | ORAL_CAPSULE | Freq: Every day | ORAL | 0 refills | Status: DC

## 2021-11-07 NOTE — Progress Notes (Signed)
Tommi Rumps, MD Phone: [phone]  Anne [name] is a 71 y.o. female who presents today for same day visit.    Left leg wound: Patient notes she brushed this against the edge of a chair.  She notes this was 3 and half weeks ago.  Has had difficulty healing.  There is been some redness spreading distally into the leg.  No fevers.  She notes that is overlying a varicose vein.  Arm wound: Patient notes her dog jumped on her and scratched off a portion of her skin.  She has an open wound.  This occurred 2 days ago.  She has cleansed it with soap and water and has been applying bandages.  Social History   Tobacco Use  Smoking Status Never  Smokeless Tobacco Never    Current Outpatient Medications on File Prior to Visit  Medication Sig Dispense Refill   acetaminophen (TYLENOL) 500 MG tablet Take 1,000 mg by mouth 2 (two) times daily as needed for moderate pain or headache.     albuterol (VENTOLIN HFA) 108 (90 Base) MCG/ACT inhaler Inhale 2 puffs into the lungs every 6 (six) hours as needed for wheezing or shortness of breath. 18 g 3   azelastine (ASTELIN) 0.1 % nasal spray Place 1 spray into both nostrils 2 (two) times daily. Use in each nostril as directed (Patient taking differently: Place 1 spray into both nostrils as needed for allergies. Use in each nostril as directed) 30 mL 6   cetirizine (ZYRTEC) 10 MG tablet TAKE 1 TABLET BY MOUTH EVERY DAY (NOT COVERED 90 tablet 0   clopidogrel (PLAVIX) 75 MG tablet TAKE 1 TABLET (75 MG TOTAL) BY MOUTH IN THE MORNING 90 tablet 1   Cyanocobalamin (VITAMIN B-12 IJ) Inject 1 Dose as directed every 30 (thirty) days.     diphenhydrAMINE (BENADRYL) 25 MG tablet Take 50 mg by mouth daily as needed for allergies (Asthma).     EPINEPHrine 0.3 mg/0.3 mL IJ SOAJ injection Inject 0.3 mg into the muscle as needed for anaphylaxis.      estradiol (ESTRACE) 1 MG tablet TAKE 1 TABLET BY MOUTH EVERY DAY 90 tablet 1   furosemide (LASIX) 20 MG tablet TAKE 1  TABLET BY MOUTH EVERY DAY AS NEEDED (Patient taking differently: Take 20 mg by mouth every morning. TAKE 1 TABLET BY MOUTH EVERY DAY AS NEEDED) 90 tablet 1   gabapentin (NEURONTIN) 300 MG capsule take 1 capsule by oral route at bedtime     guaiFENesin-codeine 100-10 MG/5ML syrup Take 5 mLs by mouth 2 (two) times daily as needed for cough. 120 mL 0   ibuprofen (ADVIL,MOTRIN) 200 MG tablet Take 400-800 mg by mouth daily as needed for headache or moderate pain.     ipratropium-albuterol (DUONEB) 0.5-2.5 (3) MG/3ML SOLN Take 3 mLs by nebulization every 4 (four) hours as needed. Dx 496 120 mL 2   methocarbamol (ROBAXIN) 500 MG tablet Take 500 mg by mouth 4 (four) times daily.     montelukast (SINGULAIR) 10 MG tablet TAKE 1 TABLET BY MOUTH EVERYDAY AT BEDTIME 90 tablet 1   Naphazoline-Pheniramine 0.027-0.315 % SOLN Place 1 drop into both eyes daily as needed (allergies).      oxyCODONE (OXY IR/ROXICODONE) 5 MG immediate release tablet Take 5 mg by mouth 2 (two) times daily as needed.     pantoprazole (PROTONIX) 40 MG tablet TAKE ONE TABLET EVERY DAY NEEDS FOR OFFICE USE VISIT FOR MORE REFILLS. 90 tablet 2   Pseudoephedrine HCl (SUDAFED PO)  Take 1 tablet by mouth daily as needed (cold symptons).     rizatriptan (MAXALT-MLT) 10 MG disintegrating tablet TAKE 1 TABLET (10 MG TOTAL) BY MOUTH DAILY AS NEEDED FOR MIGRAINE. MAY REPEAT FOR ONE DOSE 2 HOURS AFTER THE FIRST ONE IF NEEDED. 10 tablet 0   sodium chloride (OCEAN) 0.65 % SOLN nasal spray Place 1 spray into both nostrils every 4 (four) hours as needed for congestion.      White Petrolatum-Mineral Oil (LUBRICANT EYE) OINT Place 1 application into both eyes 2 (two) times daily as needed (for dry eyes).      [DISCONTINUED] zolmitriptan (ZOMIG-ZMT) 5 MG disintegrating tablet Take 1 tablet (5 mg total) by mouth daily as needed for migraine. 10 tablet 0   No current facility-administered medications on file prior to visit.     ROS see history of present  illness  Objective  Physical Exam Vitals:   11/07/21 1538  BP: 120/70  Pulse: 65  Temp: 97.8 F (36.6 C)  SpO2: 98%    BP Readings from Last 3 Encounters:  11/07/21 120/70  09/14/21 (!) 102/48  09/06/21 110/80   Wt Readings from Last 3 Encounters:  11/07/21 101 lb (45.8 kg)  09/14/21 102 lb 4.7 oz (46.4 kg)  09/06/21 102 lb 6.4 oz (46.4 kg)    Physical Exam  Right forearm   Left lower leg, the area surrounding the wound is warm with some erythema distally, no significant induration, no fluctuance  Assessment/Plan: Please see individual problem list.  Problem List Items Addressed This Visit     Leg wound, left, initial encounter    Nonhealing.  Refer to wound care.      Relevant Orders   AMB referral to wound care center   Wound cellulitis    Concern for cellulitis originating from the wound in her left lower extremity.  We will treat with cefadroxil 500 mg daily.  This is based on her creatinine clearance.  If she does not have improvement in the erythema she will let us know.  I will refer her to wound care given that this wound has been slow to heal.      Relevant Medications   cefadroxil (DURICEF) 500 MG capsule   Wound, open, arm, forearm - Primary    This is from her dog jumping on her arm.  There is no signs of infection.  She will continue to bandage the area and keep it clean with soap and water.  Tetanus vaccine will be brought up-to-date.      Relevant Orders   Td : Tetanus/diphtheria >7yo Preservative  free (Completed)    Return if symptoms worsen or fail to improve.   Tommi Rumps, MD [place]

## 2021-11-07 NOTE — Assessment & Plan Note (Signed)
Nonhealing.  Refer to wound care.

## 2021-11-07 NOTE — Patient Instructions (Signed)
Nice to see you. We will treat the likely cellulitis in your leg with cefadroxil 500 mg once daily. I have referred you to wound care and they should contact you to schedule an appointment. If the wound on your right arm develop signs of infection, purulent drainage, spreading redness, or any swelling please let us know.

## 2021-11-07 NOTE — Assessment & Plan Note (Signed)
This is from her dog jumping on her arm.  There is no signs of infection.  She will continue to bandage the area and keep it clean with soap and water.  Tetanus vaccine will be brought up-to-date.

## 2021-11-07 NOTE — Assessment & Plan Note (Signed)
Concern for cellulitis originating from the wound in her left lower extremity.  We will treat with cefadroxil 500 mg daily.  This is based on her creatinine clearance.  If she does not have improvement in the erythema she will let us know.  I will refer her to wound care given that this wound has been slow to heal.

## 2021-11-15 ENCOUNTER — Ambulatory Visit (INDEPENDENT_AMBULATORY_CARE_PROVIDER_SITE_OTHER): Payer: Medicare Other | Admitting: Pulmonary Disease

## 2021-11-15 ENCOUNTER — Other Ambulatory Visit
Admission: RE | Admit: 2021-11-15 | Discharge: 2021-11-15 | Disposition: A | Discharge status: Home / Self Care | Payer: Medicare Other | Attending: Pulmonary Disease | Admitting: Pulmonary Disease

## 2021-11-15 ENCOUNTER — Encounter: Payer: Self-pay | Admitting: Pulmonary Disease

## 2021-11-15 VITALS — BP 120/70 | HR 70 | Temp 97.5°F | Ht 63.5 in | Wt 101.2 lb

## 2021-11-15 DIAGNOSIS — E8801 Alpha-1-antitrypsin deficiency: Secondary | ICD-10-CM | POA: Diagnosis present

## 2021-11-15 DIAGNOSIS — R0602 Shortness of breath: Secondary | ICD-10-CM | POA: Diagnosis not present

## 2021-11-15 DIAGNOSIS — J4540 Moderate persistent asthma, uncomplicated: Secondary | ICD-10-CM

## 2021-11-15 DIAGNOSIS — J84112 Idiopathic pulmonary fibrosis: Secondary | ICD-10-CM | POA: Diagnosis not present

## 2021-11-15 MED ORDER — BREZTRI AEROSPHERE 160-9-4.8 MCG/ACT IN AERO: 2.0000 | INHALATION_SPRAY | Freq: Two times a day (BID) | RESPIRATORY_TRACT | 0 refills | Status: DC

## 2021-11-15 NOTE — Patient Instructions (Addendum)
We are going to give you a trial of Breztri 2 puffs twice a day.  We have provided you with a spacer to take the medication in.  Make sure you rinse your mouth well after you use it.  Let us know how you do with this medication so we can call the prescription in to the pharmacy.  We will see you in follow-up in 6 to 8 weeks time call sooner should any new problems arise.

## 2021-11-15 NOTE — Progress Notes (Signed)
Subjective:    Patient ID: Anne [name], female    DOB: [birth year], 71 y.o.   MRN: [id number] Patient Care Team: Anne Haven, MD as PCP - General (Family Medicine) Anne Hampshire, MD as PCP - Cardiology (Cardiology) Anne Retort, MD (Inactive) as Consulting Physician (Urology) Pyrtle, Lajuan Lines, MD as Consulting Physician (Gastroenterology)  Chief Complaint  Patient presents with   Follow-up   HPI Anne [name] is a 71 year old lifelong never smoker with a history of moderate persistent asthma and idiopathic pleural-parenchymal fibroelastosis who presents for follow-up.  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  She had closure of the stoma performed by Dr. Kathyrn [name] on 21 of June, this was successful, she has not had any sequela.  He continues to feel better overall since her tracheostomy has been removed.  However she has noted increasing shortness of breath particularly with environmental changes.  She also notices some increased wheezing.  She is not using her nebulizers much for asthma.  Previously has not been able to tolerate Pulmicort due to "headache".  She is not consistent with use of albuterol or DuoNeb.  She has had a slight dry cough since her tracheostomy is out.  She does not endorse no other active complaint today.   Today she presents feeling well and looking well overall.   She is on alpha MZ phenotype.  Last alpha level was 105 in March 2022.  DATA: HRCT chest 03/01/16: Extensive patchy subpleural reticulation, traction bronchiectasis, volume loss, distortion and pleural thickening relatively symmetrically and almost exclusively involving the upper lobes with associated superior hilar retraction. No frank  honeycombing. Findings have progressed since  2014. 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 Chest x-ray 05/14/2020: Severe pleural-parenchymal scarring unchanged from prior tracheostomy tube in good position. Alpha-1 antitrypsin 06/14/2020: Normal level at 105 (she has known PI MZ). 2D echo 07/07/2020: LVEF 55 to 60%.  No wall motion abnormalities, grade II DD, aortic regurgitation mild to moderate. Coronary CT 08/12/2020: Coronary calcium score 0.  Review of Systems A 10 point review of systems was performed and it is as noted above otherwise negative.  Patient Active Problem List   Diagnosis Date Noted   Neck pain 11/07/2021   Wound, open, arm, forearm 11/07/2021   Wound cellulitis 11/07/2021   Leg wound, left, initial encounter 11/07/2021   Arthritis 09/06/2021   Stress 03/11/2021   Neuropathy 08/17/2020   Scalp lesion 08/17/2020   History of COVID-19 04/19/2020   Ganglion cyst of wrist, right 04/19/2020   Nontraumatic nail splitting 04/19/2020   History of anaphylaxis 04/18/2019   Liver lesion 10/11/2018   S/P lumbar laminectomy 05/13/2018   Low back pain 02/07/2018   Iron deficiency anemia 12/11/2017   Mild persistent asthma 10/10/2017   Palpitations 08/03/2017   Bilateral leg edema 08/03/2017   Anxiety 08/03/2017   Current long-term use of postmenopausal hormone replacement therapy 03/19/2017   S/P cervical spinal fusion 01/10/2017   Kidney lesion 12/13/2016   Migraine 09/12/2016   Esophageal dysphagia 06/19/2016   Skin nodule 01/10/2016   Hypokalemia 01/10/2016   Dysuria 12/15/2015   Degenerative arthritis of cervical spine 10/28/2015   Gastritis    B12 deficiency 03/15/2015   Abdominal bloating 09/03/2014  Slow transit constipation 09/03/2014   Menopausal symptom 09/03/2014   History of TIA (transient ischemic attack) 06/03/2014   Chronic pain syndrome 04/28/2014   Fatigue 09/30/2013   Hypertensive lower  esophageal sphincter 07/29/2013   Achalasia of esophagus 07/29/2013   Edema 07/01/2013   Chronic pulmonary aspiration 06/06/2013   Insomnia 12/13/2012   HLD (hyperlipidemia) 12/13/2012   Pulmonary fibrosis ([place]) 08/05/2012   Social History   Tobacco Use   Smoking status: Never   Smokeless tobacco: Never  Substance Use Topics   Alcohol use: Yes    Comment: occ wine   Allergies  Allergen Reactions   Asa [Aspirin] Shortness Of Breath, Swelling and Other (See Comments)    Tongue swells   Bee Venom Anaphylaxis and Shortness Of Breath   Epinephrine Shortness Of Breath and Palpitations    Occurs when pt is given double the dose, pt would use epipen as needed    Feraheme [Ferumoxytol] Shortness Of Breath    Light headed    Influenza Vaccines Shortness Of Breath and Other (See Comments)    Can take flu vaccines without eggs    Pfizer-Biontech Covid-19 Vacc [Covid-19 Mrna Vacc (Moderna)] Anaphylaxis   Quinolones Swelling and Other (See Comments)    Feet swell   Shellfish Allergy Anaphylaxis   Ciprofloxacin Swelling and Other (See Comments)    ALL MEDS ENDING IN -FLOXACIN MAKE THE FEET SWELL   Clarithromycin Nausea Only    Abdomina pain   Erythromycin Nausea Only    Abdominal pain   Levaquin [Levofloxacin In D5w] Swelling and Other (See Comments)    Feet and legs ache and SWELL   Peanut-Containing Drug Products Other (See Comments)    Wheezing (boiled or raw peanuts)   Septra [Sulfamethoxazole-Trimethoprim] Diarrhea   Telithromycin Nausea Only    Abdominal pain   Zanaflex [Tizanidine] Other (See Comments)    Caused the patient to feel "spaced out" and "not well"   Adhesive [Tape] Rash and Other (See Comments)    Paper works tape works fine    Current Meds  Medication Sig   acetaminophen (TYLENOL) 500 MG tablet Take 1,000 mg by mouth 2 (two) times daily as needed for moderate pain or headache.   albuterol (VENTOLIN HFA) 108 (90 Base) MCG/ACT inhaler Inhale 2 puffs into the  lungs every 6 (six) hours as needed for wheezing or shortness of breath.   azelastine (ASTELIN) 0.1 % nasal spray Place 1 spray into both nostrils 2 (two) times daily. Use in each nostril as directed (Patient taking differently: Place 1 spray into both nostrils as needed for allergies. Use in each nostril as directed)   cetirizine (ZYRTEC) 10 MG tablet TAKE 1 TABLET BY MOUTH EVERY DAY (NOT COVERED   clopidogrel (PLAVIX) 75 MG tablet TAKE 1 TABLET (75 MG TOTAL) BY MOUTH IN THE MORNING   Cyanocobalamin (VITAMIN B-12 IJ) Inject 1 Dose as directed every 30 (thirty) days.   diphenhydrAMINE (BENADRYL) 25 MG tablet Take 50 mg by mouth daily as needed for allergies (Asthma).   EPINEPHrine 0.3 mg/0.3 mL IJ SOAJ injection Inject 0.3 mg into the muscle as needed for anaphylaxis.    estradiol (ESTRACE) 1 MG tablet TAKE 1 TABLET BY MOUTH EVERY DAY   furosemide (LASIX) 20 MG tablet TAKE 1 TABLET BY MOUTH EVERY DAY AS NEEDED (Patient taking differently: Take 20 mg by mouth every morning. TAKE 1 TABLET BY MOUTH EVERY DAY AS NEEDED)   guaiFENesin-codeine 100-10 MG/5ML syrup Take 5 mLs by mouth 2 (two)  times daily as needed for cough.   ibuprofen (ADVIL,MOTRIN) 200 MG tablet Take 400-800 mg by mouth daily as needed for headache or moderate pain.   ipratropium-albuterol (DUONEB) 0.5-2.5 (3) MG/3ML SOLN Take 3 mLs by nebulization every 4 (four) hours as needed. Dx 496   methocarbamol (ROBAXIN) 500 MG tablet Take 500 mg by mouth 4 (four) times daily.   montelukast (SINGULAIR) 10 MG tablet TAKE 1 TABLET BY MOUTH EVERYDAY AT BEDTIME   Naphazoline-Pheniramine 0.027-0.315 % SOLN Place 1 drop into both eyes daily as needed (allergies).    oxyCODONE (OXY IR/ROXICODONE) 5 MG immediate release tablet Take 5 mg by mouth 2 (two) times daily as needed.   pantoprazole (PROTONIX) 40 MG tablet TAKE ONE TABLET EVERY DAY NEEDS FOR OFFICE USE VISIT FOR MORE REFILLS.   Pseudoephedrine HCl (SUDAFED PO) Take 1 tablet by mouth daily as  needed (cold symptons).   rizatriptan (MAXALT-MLT) 10 MG disintegrating tablet TAKE 1 TABLET (10 MG TOTAL) BY MOUTH DAILY AS NEEDED FOR MIGRAINE. MAY REPEAT FOR ONE DOSE 2 HOURS AFTER THE FIRST ONE IF NEEDED.   sodium chloride (OCEAN) 0.65 % SOLN nasal spray Place 1 spray into both nostrils every 4 (four) hours as needed for congestion.    White Petrolatum-Mineral Oil (LUBRICANT EYE) OINT Place 1 application into both eyes 2 (two) times daily as needed (for dry eyes).    Immunization History  Administered Date(s) Administered   Fluad Quad(high Dose 65+) 02/14/2021   Influenza Inj Mdck Quad Pf 01/15/2019, 01/16/2020   Influenza, Quadrivalent, Recombinant, Inj, Pf 02/06/2017, 12/19/2017   Influenza,inj,Quad PF,6+ Mos 12/19/2012   Influenza,trivalent, recombinat, inj, PF 01/05/2014   Janssen (J&J) SARS-COV-2 Vaccination 01/05/2020, 03/10/2020   PFIZER(Purple Top)SARS-COV-2 Vaccination 05/09/2019   Pneumococcal Conjugate-13 01/23/2014   Pneumococcal Polysaccharide-23 03/11/2021   Td 11/07/2021       Objective:   Physical Exam BP 120/70 (BP Location: Left Arm, Patient Position: Sitting, Cuff Size: Normal)   Pulse 70   Temp (!) 97.5 F (36.4 C) (Oral)   Ht 5' 3.5" (1.613 m)   Wt 101 lb 3.2 oz (45.9 kg)   SpO2 100%   BMI 17.65 kg/m  GENERAL: Well-developed, thin but not cachectic woman, in no acute distress.  She is fully ambulatory. HEAD: Normocephalic, atraumatic. EYES: Pupils equal, round, reactive to light.  No scleral icterus.   MOUTH: Oral mucosa moist, no thrush NECK: Supple.Trachea midline. No JVD.  No adenopathy.  Tracheostomy stoma closed, well-healed. PULMONARY: Good air entry bilaterally.    Coarse breath sounds, end expiratory wheezes noted particularly in the upper lung zones, no other adventitious sounds.   CARDIOVASCULAR: S1 and S2. Regular rate and rhythm.  No rubs, murmurs or gallops heard. ABDOMEN: Benign. MUSCULOSKELETAL: No joint deformity, no clubbing, no  edema. NEUROLOGIC: No focal deficit noted.  No gait disturbance.  Phonates well occluding tracheal stoma. SKIN: Limited exam shows no rashes. PSYCH: Mood and behavior normal.     Assessment & Plan:     ICD-10-CM   1. Moderate persistent allergic asthma  J45.40    Trial of Breztri 2 puffs twice a day No samples of Symbicort available Spacer provided for medication administration    2. Shortness of breath  R06.02    Ongoing issues with bronchospasm Trial of Breztri as above    3. Idiopathic pleuroparenchymal fibroelastosis [name])  J84.112    This issue adds complexity to her management No evidence of progression    4. Alpha-1-antitrypsin deficiency ([place])  E88.01 Alpha-1-antitrypsin  We will obtain level to ensure no decline Patient known phenotype MZ     Orders Placed This Encounter  Procedures   Alpha-1-antitrypsin    Standing Status:   Future    Standing Expiration Date:   05/18/2022   Meds ordered this encounter  Medications   Budeson-Glycopyrrol-Formoterol (BREZTRI AEROSPHERE) 160-9-4.8 MCG/ACT AERO    Sig: Inhale 2 puffs into the lungs in the morning and at bedtime.    Dispense:  5.9 g    Refill:  0   Patient has been instructed not to use Brovana.  She may use as needed albuterol.  We will give her a trial of Breztri 2 puffs twice a day as we do not have samples of Symbicort and see how she does with this.  She was provided with a spacer for the inhaler.  Instructed to rinse her mouth well after use.  We will see her in follow-up in 6 to 8 weeks time she is to contact us prior to that time should any new difficulties arise.  Renold Don, MD Advanced Bronchoscopy PCCM [facility] [facility]    *This note was dictated using voice recognition software/Dragon.  Despite best efforts to proofread, errors can occur which can change the meaning. Any transcriptional errors that result from this process are unintentional and may not be fully corrected at the  time of dictation.

## 2021-11-15 NOTE — Progress Notes (Signed)
Patient seen in the office today and instructed on use of Breztri and spacer.  Patient expressed understanding and demonstrated technique.

## 2021-11-16 LAB — ALPHA-1-ANTITRYPSIN: A-1 Antitrypsin, Ser: 105 mg/dL (ref 101–187)

## 2021-12-02 ENCOUNTER — Ambulatory Visit (INDEPENDENT_AMBULATORY_CARE_PROVIDER_SITE_OTHER): Payer: Medicare Other | Admitting: *Deleted

## 2021-12-02 DIAGNOSIS — E538 Deficiency of other specified B group vitamins: Secondary | ICD-10-CM | POA: Diagnosis not present

## 2021-12-02 MED ORDER — CYANOCOBALAMIN 1000 MCG/ML IJ SOLN
1000.0000 ug | Freq: Once | INTRAMUSCULAR | Status: AC
  Administered 2021-12-02: 1000 ug via INTRAMUSCULAR

## 2021-12-02 NOTE — Progress Notes (Signed)
Pt received B12 injection in right arm. Pt tolerated it well with no complaints or concerns.

## 2021-12-05 ENCOUNTER — Ambulatory Visit: Payer: Medicare Other | Admitting: Physician Assistant

## 2021-12-06 ENCOUNTER — Telehealth: Payer: Self-pay | Admitting: Cardiovascular Disease

## 2021-12-06 NOTE — Telephone Encounter (Signed)
Per OV note on 07/19/21 w/Arida:  3.  Palpitations due to PVCs: She continues to have intermittent bradycardia on small dose Toprol 12.5 mg once daily.  I elected to discontinue Toprol today and monitor symptoms.  Returned call to patient to get BP and HR readings for the past 5-7 days (since it was stopped due to bradycardia). No answer, left detailed message for pt to call back with this information and we could then speak to Dr Fletcher Anon. Awaiting call back.

## 2021-12-06 NOTE — Telephone Encounter (Signed)
Returned the pt call. Lmtcb.

## 2021-12-06 NOTE — Telephone Encounter (Signed)
Pt returning call

## 2021-12-06 NOTE — Telephone Encounter (Signed)
Pt c/o medication issue:  1. Name of Medication: metoprolol  2. How are you currently taking this medication (dosage and times per day)?    3. Are you having a reaction (difficulty breathing--STAT)? no  4. What is your medication issue? Calling to be put back on the medication. Please advise

## 2021-12-07 NOTE — Telephone Encounter (Signed)
Pt states that she will call back next week to give readings. She states that she has been unable to take them due to being super busy this past week. Jusy FYI.

## 2021-12-15 ENCOUNTER — Ambulatory Visit (INDEPENDENT_AMBULATORY_CARE_PROVIDER_SITE_OTHER): Payer: Medicare Other

## 2021-12-15 DIAGNOSIS — Z23 Encounter for immunization: Secondary | ICD-10-CM | POA: Diagnosis not present

## 2021-12-26 ENCOUNTER — Telehealth: Payer: Self-pay | Admitting: Family Medicine

## 2021-12-26 NOTE — Telephone Encounter (Signed)
Patient request call back 01/02/22

## 2022-01-04 ENCOUNTER — Ambulatory Visit (INDEPENDENT_AMBULATORY_CARE_PROVIDER_SITE_OTHER): Payer: Medicare Other | Admitting: *Deleted

## 2022-01-04 DIAGNOSIS — E538 Deficiency of other specified B group vitamins: Secondary | ICD-10-CM | POA: Diagnosis not present

## 2022-01-04 MED ORDER — CYANOCOBALAMIN 1000 MCG/ML IJ SOLN
1000.0000 ug | Freq: Once | INTRAMUSCULAR | Status: AC
  Administered 2022-01-04: 1000 ug via INTRAMUSCULAR

## 2022-01-04 NOTE — Progress Notes (Signed)
Pt received B12 injection in left deltoid. Pt tolerated it well with no complaints or concerns. 

## 2022-01-05 ENCOUNTER — Other Ambulatory Visit: Payer: Self-pay | Admitting: Family Medicine

## 2022-01-05 ENCOUNTER — Telehealth: Payer: Self-pay | Admitting: Family Medicine

## 2022-01-05 NOTE — Telephone Encounter (Signed)
It could be the plavix though coming off of the plavix would require an in depth discussion of the risks and benefits for her. This would best be done during an office visit. Eliquis is not an option to replace the plavix for her. Eliquis works in a different manner and is for different medical issues than for why she is on the plavix.

## 2022-01-05 NOTE — Telephone Encounter (Signed)
I called and LVM for the patient to call back to schedule a visit with provider I put her in the 4;30 pm slot for Friday and informed her on the VM of that and if she needed to change the appointment to call and  let me know. It can be in person or a VV.  Raquelle Pietro,cma

## 2022-01-05 NOTE — Telephone Encounter (Signed)
Pt called stating she is having bruises after taking plavix. Pt may think she is taking to much of the medication. Pt would like to be called

## 2022-01-05 NOTE — Telephone Encounter (Signed)
Patient stated she is having a lot of bruises and it is excessive without touching anything. She thinks it is the Plavix. She is willing to try Eliquis.

## 2022-01-06 ENCOUNTER — Ambulatory Visit: Payer: Medicare Other | Admitting: Family Medicine

## 2022-01-09 ENCOUNTER — Encounter: Payer: Self-pay | Admitting: Family Medicine

## 2022-01-09 ENCOUNTER — Telehealth (INDEPENDENT_AMBULATORY_CARE_PROVIDER_SITE_OTHER): Payer: Medicare Other | Admitting: Family Medicine

## 2022-01-09 DIAGNOSIS — T148XXA Other injury of unspecified body region, initial encounter: Secondary | ICD-10-CM | POA: Insufficient documentation

## 2022-01-09 NOTE — Assessment & Plan Note (Addendum)
Likely related to her Plavix.  Discussed the potential for coming off of this versus staying on it.  Discussed the benefit of staying on it would be reduction in risk of having a TIA or stroke.  Discussed the benefit of coming off of the reduction in bruising and bleeding risk.  Discussed that its been so long since her TIA that it would be reasonable to come off of the Plavix with her understanding that there is some increased risk of TIA or stroke with being off of the Plavix.  We discussed that the benefit of coming off of it likely outweighs the risk of remaining on it with her extensive bruising.  At this time we jointly agreed to have her discontinue the Plavix.  Advised if she develops any stroke symptoms she needs to seek medical attention immediately.

## 2022-01-09 NOTE — Progress Notes (Signed)
Virtual Visit via telephone Note  This visit type was conducted due to national recommendations for restrictions regarding the COVID-19 pandemic (e.g. social distancing).  This format is felt to be most appropriate for this patient at this time.  All issues noted in this document were discussed and addressed.  No physical exam was performed (except for noted visual exam findings with Video Visits).   I connected with Anne [name] today at  4:30 PM EDT by telephone and verified that I am speaking with the correct person using two identifiers. Location patient: home Location provider: work  Persons participating in the virtual visit: patient, provider  I discussed the limitations, risks, security and privacy concerns of performing an evaluation and management service by telephone and the availability of in person appointments. I also discussed with the patient that there may be a patient responsible charge related to this service. The patient expressed understanding and agreed to proceed.  Interactive audio and video telecommunications were attempted between this provider and patient, however failed, due to patient having technical difficulties OR patient did not have access to video capability.  We continued and completed visit with audio only.   Reason for visit: same day visit  HPI: Bruising: Patient notes she has had fairly significant bruising recently.  She recently bumped her arm against the bottom of the bed and had bruising circumferentially around the arm.  She notes other episodes of significant bruising as well.  She has not had any bleeding episodes.  She has been on Plavix since the mid 2000's for 2 episodes of a TIA.  She wonders about coming off of this.  She has an allergy to aspirin.   ROS: See pertinent positives and negatives per HPI.  Past Medical History:  Diagnosis Date   Achalasia of esophagus    Allergic rhinitis    Allergy    multiple, mostly aspirin, levaquin  and shellfish.   Anemia    Anxiety    Arthritis    Asthma    Bilateral leg edema    Cataract 2014   exraction with len implant   Chronic pain syndrome    Chronic pulmonary aspiration    Complication of anesthesia    Breathing problems upon waking up. 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   Compressed cervical disc    COPD (chronic obstructive pulmonary disease) (HCC)    CVA (cerebral infarction)    Dyspnea    Esophageal dysmotility    Gastritis    H/O food anaphylaxis    Heart murmur    as child   History of hiatal hernia    Hypokalemia    IBS (irritable bowel syndrome)    Kidney lesion 2018   Left   Liver lesion    Migraine    PICC (peripherally inserted central catheter) removal 02/20/2017   PONV (postoperative nausea and vomiting)    Problems with swallowing    intermittently   Pulmonary fibrosis (HCC)    Renal cell carcinoma (HCC)    small- left kidney   Seizures ([place])    only in elementary school-was on medication and was tx up until 5th grade and has had no more seizures since then   Shingles    Stroke [facility])    slurred speech, drawn face, imaging normal, occurred twice, [facility]-"TIA" if antything" 02/20/17-no residual effects   Tracheal granulation    Tracheostomy in place [facility])    Vocal cord paresis     Past Surgical History:  Procedure Laterality Date   ABDOMINAL HYSTERECTOMY     APPLICATION OF A-CELL OF HEAD/NECK N/A 02/21/2017   Procedure: APPLICATION OF A-CELL OF HEAD/NECK;  Surgeon: Wallace Going, DO;  Location: [place];  Service: Plastics;  Laterality: N/A;   BACK SURGERY     BOTOX INJECTION N/A 07/29/2013   Procedure: BOTOX INJECTION;  Surgeon: Jerene Bears, MD;  Location[facility];  Service: Gastroenterology;  Laterality: N/A;   BOTOX INJECTION N/A 05/04/2015   Procedure: BOTOX INJECTION;  Surgeon: Jerene Bears, MD;  [facility];  Service: Gastroenterology;  Laterality: N/A;   BOTOX INJECTION N/A  02/18/2018   Procedure: BOTOX INJECTION;  Surgeon: Jerene Bears, MD;  [facility];  Service: Gastroenterology;  Laterality: N/A;   BOTOX INJECTION N/A 06/30/2019   Procedure: BOTOX INJECTION;  Surgeon: Jerene Bears, MD;  [facility];  Service: Gastroenterology;  Laterality: N/A;   BOTOX INJECTION N/A 08/05/2020   Procedure: BOTOX INJECTION;  Surgeon: Jerene Bears, MD;  [facility];  Service: Gastroenterology;  Laterality: N/A;   BOTOX INJECTION N/A 06/16/2021   Procedure: BOTOX INJECTION;  Surgeon: Jerene Bears, MD;  [facility];  Service: Gastroenterology;  Laterality: N/A;   BREAST BIOPSY Left    neg   BREAST SURGERY Left 2002   bx of skin   CHOLECYSTECTOMY     COLONOSCOPY     DIAGNOSTIC LAPAROSCOPY     ESOPHAGEAL MANOMETRY N/A 12/16/2012   Procedure: ESOPHAGEAL MANOMETRY (EM);  Surgeon: Jerene Bears, MD;  [facility];  Service: Gastroenterology;  Laterality: N/A;   ESOPHAGOGASTRODUODENOSCOPY (EGD) WITH PROPOFOL N/A 07/29/2013   Procedure: ESOPHAGOGASTRODUODENOSCOPY (EGD) WITH PROPOFOL;  Surgeon: Jerene Bears, MD;  [facility];  Service: Gastroenterology;  Laterality: N/A;  with botox injection   ESOPHAGOGASTRODUODENOSCOPY (EGD) WITH PROPOFOL N/A 05/04/2015   Procedure: ESOPHAGOGASTRODUODENOSCOPY (EGD) WITH PROPOFOL;  Surgeon: Jerene Bears, MD;  [facility];  Service: Gastroenterology;  Laterality: N/A;   ESOPHAGOGASTRODUODENOSCOPY (EGD) WITH PROPOFOL N/A 02/18/2018   Procedure: ESOPHAGOGASTRODUODENOSCOPY (EGD) WITH PROPOFOL;  Surgeon: Jerene Bears, MD;  [facility];  Service: Gastroenterology;  Laterality: N/A;   ESOPHAGOGASTRODUODENOSCOPY (EGD) WITH PROPOFOL N/A 06/30/2019   Procedure: ESOPHAGOGASTRODUODENOSCOPY (EGD) WITH PROPOFOL;  Surgeon: Jerene Bears, MD;  [facility];  Service: Gastroenterology;  Laterality: N/A;   ESOPHAGOGASTRODUODENOSCOPY (EGD) WITH PROPOFOL N/A 08/05/2020    Procedure: ESOPHAGOGASTRODUODENOSCOPY (EGD) WITH PROPOFOL;  Surgeon: Jerene Bears, MD;  [facility];  Service: Gastroenterology;  Laterality: N/A;   ESOPHAGOGASTRODUODENOSCOPY (EGD) WITH PROPOFOL N/A 06/16/2021   Procedure: ESOPHAGOGASTRODUODENOSCOPY (EGD) WITH PROPOFOL;  Surgeon: Jerene Bears, MD;  [facility];  Service: Gastroenterology;  Laterality: N/A;   EYE SURGERY     Catarct surgery 2014   Eye Surgery AS Child Left    INCISION AND DRAINAGE OF WOUND N/A 02/21/2017   Procedure: IRRIGATION AND DEBRIDEMENT WOUND NECK;  Surgeon: Wallace Going, DO;  Location: [place];  Service: Plastics;  Laterality: N/A;   JEJUNOSTOMY FEEDING TUBE     x2 both failed. no longer has   LUMBAR LAMINECTOMY/DECOMPRESSION MICRODISCECTOMY Bilateral 05/13/2018   Procedure: Laminectomy and Foraminotomy - Lumbar four-Lumbar five- bilateral;  Surgeon: Eustace Moore, MD;  Location: [place];  Service: Neurosurgery;  Laterality: Bilateral;   LUMBAR LAMINECTOMY/DECOMPRESSION MICRODISCECTOMY Bilateral 11/08/2020   Procedure: Laminectomy and Foraminotomy - bilateral - Lumbar two-Lumbar three - Lumbar three-Lumbar four;  Surgeon: Eustace Moore, MD;  Location: [place];  Service: Neurosurgery;  Laterality: Bilateral;   MULTIPLE TOOTH EXTRACTIONS     2 teeth removed   OOPHORECTOMY     POSTERIOR CERVICAL FUSION/FORAMINOTOMY N/A 01/10/2017   Procedure: LAMINECTOMY AND FORAMINOTOMY CERVICAL FOUR-CERVICAL FIVE, CERVICAL FIVE-SIX POSTERIOR CERVICAL INSTRUMENT FUSION CERVICAL THREE-CERVICAL SEVEN,CERVICAL LAMINECTOMY CERVICAL THREE-CERVICAL SEVEN.;  Surgeon: Eustace Moore, MD;  Location: [place];  Service: Neurosurgery;  Laterality: N/A;  posterior   TONSILLECTOMY     TRACHEOSTOMY  1996   done at [facility], Dr. Kathyrn Sheriff   TRACHEOSTOMY REVISION N/A 09/14/2021   Procedure: TRACHEOSTOMA REVISION, TRACHEOPLASTY;  Surgeon: Margaretha Sheffield, MD;  Location: [facility];  Service: ENT;  Laterality: N/A;   TUBAL LIGATION     VIDEO  BRONCHOSCOPY Bilateral 11/20/2012   Procedure: VIDEO BRONCHOSCOPY WITH FLUORO;  Surgeon: Juanito Doom, MD;  [facility];  Service: Cardiopulmonary;  Laterality: Bilateral;    Family History  Problem Relation Age of Onset   Asthma Cousin    COPD Cousin    Breast cancer Maternal Grandmother 64   Asthma Father    Kidney cancer Father    Arrhythmia Father    Lung cancer Father    Lung cancer Paternal Uncle    COPD Paternal Grandfather    Alcohol abuse Mother    Breast cancer Maternal Aunt 57   Bladder Cancer Neg Hx    Colon cancer Neg Hx    Esophageal cancer Neg Hx    Pancreatic cancer Neg Hx    Stomach cancer Neg Hx    Liver disease Neg Hx     SOCIAL HX: Non-smoker   Current Outpatient Medications:    acetaminophen (TYLENOL) 500 MG tablet, Take 1,000 mg by mouth 2 (two) times daily as needed for moderate pain or headache., Disp: , Rfl:    albuterol (VENTOLIN HFA) 108 (90 Base) MCG/ACT inhaler, Inhale 2 puffs into the lungs every 6 (six) hours as needed for wheezing or shortness of breath., Disp: 18 g, Rfl: 3   azelastine (ASTELIN) 0.1 % nasal spray, Place 1 spray into both nostrils 2 (two) times daily. Use in each nostril as directed (Patient taking differently: Place 1 spray into both nostrils as needed for allergies. Use in each nostril as directed), Disp: 30 mL, Rfl: 6   Budeson-Glycopyrrol-Formoterol (BREZTRI AEROSPHERE) 160-9-4.8 MCG/ACT AERO, Inhale 2 puffs into the lungs in the morning and at bedtime., Disp: 5.9 g, Rfl: 0   cetirizine (ZYRTEC) 10 MG tablet, TAKE 1 TABLET BY MOUTH EVERY DAY (NOT COVERED, Disp: 90 tablet, Rfl: 0   Cyanocobalamin (VITAMIN B-12 IJ), Inject 1 Dose as directed every 30 (thirty) days., Disp: , Rfl:    diphenhydrAMINE (BENADRYL) 25 MG tablet, Take 50 mg by mouth daily as needed for allergies (Asthma)., Disp: , Rfl:    EPINEPHrine 0.3 mg/0.3 mL IJ SOAJ injection, Inject 0.3 mg into the muscle as needed for anaphylaxis. , Disp: , Rfl:     estradiol (ESTRACE) 1 MG tablet, TAKE 1 TABLET BY MOUTH EVERY DAY, Disp: 90 tablet, Rfl: 1   furosemide (LASIX) 20 MG tablet, TAKE 1 TABLET BY MOUTH EVERY DAY AS NEEDED (Patient taking differently: Take 20 mg by mouth every morning. TAKE 1 TABLET BY MOUTH EVERY DAY AS NEEDED), Disp: 90 tablet, Rfl: 1   guaiFENesin-codeine 100-10 MG/5ML syrup, Take 5 mLs by mouth 2 (two) times daily as needed for cough., Disp: 120 mL, Rfl: 0   ibuprofen (ADVIL,MOTRIN) 200 MG tablet, Take 400-800 mg by mouth daily as needed for headache or  moderate pain., Disp: , Rfl:    ipratropium-albuterol (DUONEB) 0.5-2.5 (3) MG/3ML SOLN, Take 3 mLs by nebulization every 4 (four) hours as needed. Dx 496, Disp: 120 mL, Rfl: 2   methocarbamol (ROBAXIN) 500 MG tablet, Take 500 mg by mouth 4 (four) times daily., Disp: , Rfl:    montelukast (SINGULAIR) 10 MG tablet, TAKE 1 TABLET BY MOUTH EVERYDAY AT BEDTIME, Disp: 90 tablet, Rfl: 1   Naphazoline-Pheniramine 0.027-0.315 % SOLN, Place 1 drop into both eyes daily as needed (allergies). , Disp: , Rfl:    oxyCODONE (OXY IR/ROXICODONE) 5 MG immediate release tablet, Take 5 mg by mouth 2 (two) times daily as needed., Disp: , Rfl:    pantoprazole (PROTONIX) 40 MG tablet, TAKE ONE TABLET EVERY DAY NEEDS FOR OFFICE USE VISIT FOR MORE REFILLS., Disp: 90 tablet, Rfl: 2   Pseudoephedrine HCl (SUDAFED PO), Take 1 tablet by mouth daily as needed (cold symptons)., Disp: , Rfl:    rizatriptan (MAXALT-MLT) 10 MG disintegrating tablet, TAKE 1 TABLET (10 MG TOTAL) BY MOUTH DAILY AS NEEDED FOR MIGRAINE. MAY REPEAT FOR ONE DOSE 2 HOURS AFTER THE FIRST ONE IF NEEDED., Disp: 10 tablet, Rfl: 0   sodium chloride (OCEAN) 0.65 % SOLN nasal spray, Place 1 spray into both nostrils every 4 (four) hours as needed for congestion. , Disp: , Rfl:    White Petrolatum-Mineral Oil (LUBRICANT EYE) OINT, Place 1 application into both eyes 2 (two) times daily as needed (for dry eyes). , Disp: , Rfl:   EXAM: This was a  telephone visit and thus no exam was completed.  ASSESSMENT AND PLAN:  Discussed the following assessment and plan:  Problem List Items Addressed This Visit     Bruising    Likely related to her Plavix.  Discussed the potential for coming off of this versus staying on it.  Discussed the benefit of staying on it would be reduction in risk of having a TIA or stroke.  Discussed the benefit of coming off of the reduction in bruising and bleeding risk.  Discussed that its been so long since her TIA that it would be reasonable to come off of the Plavix with her understanding that there is some increased risk of TIA or stroke with being off of the Plavix.  We discussed that the benefit of coming off of it likely outweighs the risk of remaining on it with her extensive bruising.  At this time we jointly agreed to have her discontinue the Plavix.  Advised if she develops any stroke symptoms she needs to seek medical attention immediately.       Return for as scheduled.   I discussed the assessment and treatment plan with the patient. The patient was provided an opportunity to ask questions and all were answered. The patient agreed with the plan and demonstrated an understanding of the instructions.   The patient was advised to call back or seek an in-person evaluation if the symptoms worsen or if the condition fails to improve as anticipated.  I provided 11 minutes of non-face-to-face time during this encounter.   Tommi Rumps, MD

## 2022-01-13 ENCOUNTER — Ambulatory Visit
Admission: RE | Admit: 2022-01-13 | Discharge: 2022-01-13 | Disposition: A | Discharge status: Home / Self Care | Payer: Medicare Other | Source: Ambulatory Visit | Attending: Pulmonary Disease | Admitting: Pulmonary Disease

## 2022-01-13 ENCOUNTER — Telehealth: Payer: Self-pay | Admitting: Pulmonary Disease

## 2022-01-13 ENCOUNTER — Ambulatory Visit (INDEPENDENT_AMBULATORY_CARE_PROVIDER_SITE_OTHER): Payer: Medicare Other | Admitting: Pulmonary Disease

## 2022-01-13 VITALS — BP 112/70 | HR 60 | Ht 63.0 in | Wt 101.4 lb

## 2022-01-13 DIAGNOSIS — I5189 Other ill-defined heart diseases: Secondary | ICD-10-CM

## 2022-01-13 DIAGNOSIS — R0602 Shortness of breath: Secondary | ICD-10-CM

## 2022-01-13 DIAGNOSIS — J84112 Idiopathic pulmonary fibrosis: Secondary | ICD-10-CM

## 2022-01-13 DIAGNOSIS — J4540 Moderate persistent asthma, uncomplicated: Secondary | ICD-10-CM | POA: Diagnosis not present

## 2022-01-13 DIAGNOSIS — J84111 Idiopathic interstitial pneumonia, not otherwise specified: Secondary | ICD-10-CM

## 2022-01-13 DIAGNOSIS — E8801 Alpha-1-antitrypsin deficiency: Secondary | ICD-10-CM

## 2022-01-13 MED ORDER — TRELEGY ELLIPTA 100-62.5-25 MCG/ACT IN AEPB: 1.0000 | INHALATION_SPRAY | Freq: Every day | RESPIRATORY_TRACT | 0 refills | Status: DC

## 2022-01-13 NOTE — Progress Notes (Signed)
Subjective:    Patient ID: Anne [name], female    DOB: March 25, 1951, 71 y.o.   MRN: [id number] Patient Care Team: Glori Luis, MD as PCP - General (Family Medicine) Iran Ouch, MD as PCP - Cardiology (Cardiology) Hildred Laser, MD (Inactive) as Consulting Physician (Urology) Pyrtle, Carie Caddy, MD as Consulting Physician (Gastroenterology)  Chief Complaint  Patient presents with   Follow-up    Asthma. SOB with exertion. Occasional SOB when sitting.  Some wheezing.    HPI Anne [name] is a 71 year old lifelong never smoker with a history of moderate persistent asthma and idiopathic pleural-parenchymal fibroelastosis who presents for follow-up.  She was last seen here on 15 November 2021.  At that time she was given a trial of Breztri however noted that this gave her headache and also make her hoarse.  She discontinued the medication.  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  She had closure of the stoma performed by Dr. Elenore Rota on 21 of June 2023, this was successful, she has not had any sequelae.  He continues to feel better overall since her tracheostomy has been removed.  However she has noted increasing shortness of breath particularly with environmental changes.  During travel to [place] this past weekend and noted increasing shortness of breath, inability to keep up with the group that she was with ,and having to use her rescue inhaler frequently.  She also noticed some increased wheezing.  She is not using her nebulizers much for asthma.  Previously has not been able to tolerate Pulmicort due to "headache".  She is not consistent with use of albuterol or DuoNeb.  He has been tried on multiple controller medications but seems  to have a side effect to all of them.  He has not tried Trelegy previously.     She is an alpha MZ phenotype.  Last alpha level was 105 in 15 November 2021.  Levels have been consistent.  DATA: HRCT chest 03/01/16: Extensive patchy subpleural reticulation, traction bronchiectasis, volume loss, distortion and pleural thickening relatively symmetrically and almost exclusively involving the upper lobes with associated superior hilar retraction. No frank honeycombing. Findings have progressed since  2014. 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 Chest x-ray 05/14/2020: Severe pleural-parenchymal scarring unchanged from prior tracheostomy tube in good position. Alpha-1 antitrypsin 06/14/2020: Normal level at 105 (she has known PI MZ). 2D echo 07/07/2020: LVEF 55 to 60%.  No wall motion abnormalities, grade II DD, aortic regurgitation mild to moderate. Coronary CT 08/12/2020: Coronary calcium score 0. HRCT 04/27/2021: Idiopathic pleural-parenchymal fibroelastosis redemonstrated and without change.  Small left-sided loculated pleural effusion predominantly mid to upper left hemithorax.  Review of Systems A 10 point review of systems was performed and it is as noted above otherwise negative.  Patient Active Problem List   Diagnosis Date Noted   Bruising 01/09/2022   Neck pain 11/07/2021   Wound, open, arm, forearm 11/07/2021   Wound cellulitis 11/07/2021   Leg wound, left, initial encounter 11/07/2021   Arthritis 09/06/2021   Stress 03/11/2021   Neuropathy 08/17/2020   Scalp lesion 08/17/2020   History of COVID-19 04/19/2020   Ganglion cyst of wrist, right 04/19/2020   Nontraumatic nail splitting 04/19/2020   History of  anaphylaxis 04/18/2019   Liver lesion 10/11/2018   S/P lumbar laminectomy 05/13/2018   Low back pain 02/07/2018   Iron deficiency anemia 12/11/2017   Mild persistent asthma 10/10/2017   Palpitations 08/03/2017   Bilateral leg  edema 08/03/2017   Anxiety 08/03/2017   Current long-term use of postmenopausal hormone replacement therapy 03/19/2017   S/P cervical spinal fusion 01/10/2017   Kidney lesion 12/13/2016   Migraine 09/12/2016   Esophageal dysphagia 06/19/2016   Skin nodule 01/10/2016   Hypokalemia 01/10/2016   Dysuria 12/15/2015   Degenerative arthritis of cervical spine 10/28/2015   Gastritis    B12 deficiency 03/15/2015   Abdominal bloating 09/03/2014   Slow transit constipation 09/03/2014   Menopausal symptom 09/03/2014   History of TIA (transient ischemic attack) 06/03/2014   Chronic pain syndrome 04/28/2014   Fatigue 09/30/2013   Hypertensive lower esophageal sphincter 07/29/2013   Achalasia of esophagus 07/29/2013   Edema 07/01/2013   Chronic pulmonary aspiration 06/06/2013   Insomnia 12/13/2012   HLD (hyperlipidemia) 12/13/2012   Pulmonary fibrosis (HCC) 08/05/2012   Social History   Tobacco Use   Smoking status: Never   Smokeless tobacco: Never  Substance Use Topics   Alcohol use: Yes    Comment: occ wine   Allergies  Allergen Reactions   Asa [Aspirin] Shortness Of Breath, Swelling and Other (See Comments)    Tongue swells   Bee Venom Anaphylaxis and Shortness Of Breath   Epinephrine Shortness Of Breath and Palpitations    Occurs when pt is given double the dose, pt would use epipen as needed    Feraheme [Ferumoxytol] Shortness Of Breath    Light headed    Influenza Vaccines Shortness Of Breath and Other (See Comments)    Can take flu vaccines without eggs    Pfizer-Biontech Covid-19 Vacc [Covid-19 Mrna Vacc (Moderna)] Anaphylaxis   Quinolones Swelling and Other (See Comments)    Feet swell   Shellfish Allergy Anaphylaxis   Ciprofloxacin Swelling and Other (See Comments)    ALL MEDS ENDING IN -FLOXACIN MAKE THE FEET SWELL   Clarithromycin Nausea Only    Abdomina pain   Erythromycin Nausea Only    Abdominal pain   Levaquin [Levofloxacin In D5w] Swelling and Other (See  Comments)    Feet and legs ache and SWELL   Peanut-Containing Drug Products Other (See Comments)    Wheezing (boiled or raw peanuts)   Septra [Sulfamethoxazole-Trimethoprim] Diarrhea   Telithromycin Nausea Only    Abdominal pain   Zanaflex [Tizanidine] Other (See Comments)    Caused the patient to feel "spaced out" and "not well"   Adhesive [Tape] Rash and Other (See Comments)    Paper works tape works fine    Current Meds  Medication Sig   acetaminophen (TYLENOL) 500 MG tablet Take 1,000 mg by mouth 2 (two) times daily as needed for moderate pain or headache.   albuterol (VENTOLIN HFA) 108 (90 Base) MCG/ACT inhaler Inhale 2 puffs into the lungs every 6 (six) hours as needed for wheezing or shortness of breath.   azelastine (ASTELIN) 0.1 % nasal spray Place 1 spray into both nostrils 2 (two) times daily. Use in each nostril as directed (Patient taking differently: Place 1 spray into both nostrils as needed for allergies. Use in each nostril as directed)   Budeson-Glycopyrrol-Formoterol (BREZTRI AEROSPHERE) 160-9-4.8 MCG/ACT AERO Inhale 2 puffs into the lungs in the morning and at bedtime.   cetirizine (ZYRTEC) 10 MG tablet TAKE 1 TABLET BY MOUTH EVERY DAY (NOT  COVERED   Cyanocobalamin (VITAMIN B-12 IJ) Inject 1 Dose as directed every 30 (thirty) days.   diphenhydrAMINE (BENADRYL) 25 MG tablet Take 50 mg by mouth daily as needed for allergies (Asthma).   EPINEPHrine 0.3 mg/0.3 mL IJ SOAJ injection Inject 0.3 mg into the muscle as needed for anaphylaxis.    estradiol (ESTRACE) 1 MG tablet TAKE 1 TABLET BY MOUTH EVERY DAY   Fluticasone-Umeclidin-Vilant (TRELEGY ELLIPTA) 100-62.5-25 MCG/ACT AEPB Inhale 1 puff into the lungs daily.   furosemide (LASIX) 20 MG tablet TAKE 1 TABLET BY MOUTH EVERY DAY AS NEEDED (Patient taking differently: Take 20 mg by mouth every morning. TAKE 1 TABLET BY MOUTH EVERY DAY AS NEEDED)   guaiFENesin-codeine 100-10 MG/5ML syrup Take 5 mLs by mouth 2 (two) times daily as  needed for cough.   ibuprofen (ADVIL,MOTRIN) 200 MG tablet Take 400-800 mg by mouth daily as needed for headache or moderate pain.   ipratropium-albuterol (DUONEB) 0.5-2.5 (3) MG/3ML SOLN Take 3 mLs by nebulization every 4 (four) hours as needed. Dx 496   methocarbamol (ROBAXIN) 500 MG tablet Take 500 mg by mouth 4 (four) times daily.   montelukast (SINGULAIR) 10 MG tablet TAKE 1 TABLET BY MOUTH EVERYDAY AT BEDTIME   Naphazoline-Pheniramine 0.027-0.315 % SOLN Place 1 drop into both eyes daily as needed (allergies).    oxyCODONE (OXY IR/ROXICODONE) 5 MG immediate release tablet Take 5 mg by mouth 2 (two) times daily as needed.   pantoprazole (PROTONIX) 40 MG tablet TAKE ONE TABLET EVERY DAY NEEDS FOR OFFICE USE VISIT FOR MORE REFILLS.   Pseudoephedrine HCl (SUDAFED PO) Take 1 tablet by mouth daily as needed (cold symptons).   rizatriptan (MAXALT-MLT) 10 MG disintegrating tablet TAKE 1 TABLET (10 MG TOTAL) BY MOUTH DAILY AS NEEDED FOR MIGRAINE. MAY REPEAT FOR ONE DOSE 2 HOURS AFTER THE FIRST ONE IF NEEDED.   sodium chloride (OCEAN) 0.65 % SOLN nasal spray Place 1 spray into both nostrils every 4 (four) hours as needed for congestion.    White Petrolatum-Mineral Oil (LUBRICANT EYE) OINT Place 1 application into both eyes 2 (two) times daily as needed (for dry eyes).    Immunization History  Administered Date(s) Administered   Fluad Quad(high Dose 65+) 02/14/2021   Influenza Inj Mdck Quad Pf 01/15/2019, 01/16/2020, 12/15/2021   Influenza, Quadrivalent, Recombinant, Inj, Pf 02/06/2017, 12/19/2017   Influenza,inj,Quad PF,6+ Mos 12/19/2012   Influenza,trivalent, recombinat, inj, PF 01/05/2014   Janssen (J&J) SARS-COV-2 Vaccination 01/05/2020, 03/10/2020   PFIZER(Purple Top)SARS-COV-2 Vaccination 05/09/2019   Pneumococcal Conjugate-13 01/23/2014   Pneumococcal Polysaccharide-23 03/11/2021   Td 11/07/2021      Objective:   Physical Exam BP 112/70 (BP Location: Left Arm, Cuff Size: Normal)    Pulse 60   Ht 5\' 3"  (1.6 m)   Wt 101 lb 6.4 oz (46 kg)   SpO2 99%   BMI 17.96 kg/m  GENERAL: Well-developed, chronically ill-appearing, thin but not cachectic woman, in no acute distress.  She is fully ambulatory. HEAD: Normocephalic, atraumatic. EYES: Pupils equal, round, reactive to light.  No scleral icterus.   MOUTH: Oral mucosa moist, no thrush NECK: Supple.Trachea midline. No JVD.  No adenopathy.  Tracheostomy stoma closed, well-healed. PULMONARY: Good air entry bilaterally.    Coarse breath sounds, end expiratory wheezes noted particularly in the upper lung zones, no other adventitious sounds.   CARDIOVASCULAR: S1 and S2. Regular rate and rhythm.  No rubs, murmurs or gallops heard. ABDOMEN: Benign. MUSCULOSKELETAL: No joint deformity, no clubbing, no edema. NEUROLOGIC: No focal  deficit noted.  No gait disturbance.  Phonating well. SKIN: Limited exam shows no rashes. PSYCH: Mood and behavior normal.  Attempted FeNO however, patient could not perform maneuver.     Assessment & Plan:     ICD-10-CM   1. Moderate persistent allergic asthma  J45.40    Trial of Trelegy 100 Has had multiple medication sensitivities Difficult to treat due to this    2. Idiopathic pleuroparenchymal fibroelastosis (HCC)  J84.112    Chest x-ray PA and lateral today Assessment with PFTs No prior PFTs due to tracheostomy status previously    3. Shortness of breath  R06.02 ECHOCARDIOGRAM COMPLETE    Pulmonary Function Test [place] Only    DG Chest 2 View   May be related to pulmonary/cardiac Has known diastolic dysfunction type II Reassess with PFTs/2D echo    4. Grade II diastolic dysfunction  I51.89    Reassess with 2D echo    5. Alpha-1-antitrypsin deficiency (HCC) - MZ phenotype  E88.01    Levels have been consistent at 105      Orders Placed This Encounter  Procedures   DG Chest 2 View    Standing Status:   Future    Number of Occurrences:   1    Standing Expiration Date:   01/14/2023     Order Specific Question:   Reason for Exam (SYMPTOM  OR DIAGNOSIS REQUIRED)    Answer:   shortness of breath    Order Specific Question:   Preferred imaging location?    Answer:   [facility]   Pulmonary Function Test [facility] Only    Standing Status:   Future    Number of Occurrences:   1    Standing Expiration Date:   01/14/2023    Order Specific Question:   Full PFT: includes the following: basic spirometry, spirometry pre & post bronchodilator, diffusion capacity (DLCO), lung volumes    Answer:   Full PFT    Order Specific Question:   This test can only be performed at    Answer:   [facility]   ECHOCARDIOGRAM COMPLETE    Standing Status:   Future    Number of Occurrences:   1    Standing Expiration Date:   01/14/2023    Order Specific Question:   Where should this test be performed    Answer:   [facility] [place]    Order Specific Question:   Perflutren DEFINITY (image enhancing agent) should be administered unless hypersensitivity or allergy exist    Answer:   Administer Perflutren    Order Specific Question:   Reason for exam-Echo    Answer:   Dyspnea  R06.00   Meds ordered this encounter  Medications   Fluticasone-Umeclidin-Vilant (TRELEGY ELLIPTA) 100-62.5-25 MCG/ACT AEPB    Sig: Inhale 1 puff into the lungs daily.    Dispense:  14 each    Refill:  0    Order Specific Question:   Lot Number?    Answer:   [id number]    Order Specific Question:   Expiration Date?    Answer:   06/26/2023    Order Specific Question:   Quantity    Answer:   1   Patient is being given a trial of Trelegy Ellipta.  He is to let us know how this medication works for her.  Initially we were unable to get PFTs due to her tracheostomy status.  We will try to get PFTs now that she has been decannulated.  We  will see her in follow-up in 4 to 6 weeks time she is to call sooner should any new problems arise.  Gailen Shelter, MD Advanced [facility]ch[facility] P[facility]   [facility]    *This note was dictated using voice recognition software/Dragon.  Despite best efforts to proofread, errors can occur which can change the meaning. Any transcriptional errors that result from this process are unintentional and may not be fully corrected at the time of dictation.

## 2022-01-13 NOTE — Telephone Encounter (Signed)
Called and spoke to patient.  She is aware that Trelegy is 1 puff once daily. She voiced her understanding.  Nothing further needed.

## 2022-01-13 NOTE — Patient Instructions (Addendum)
We are giving you a sample of an inhaler called Trelegy Ellipta.  You have a 2-week supply plus a coupon for a full month free which you can get when you pick up at your pharmacy.  Make sure you rinse your mouth well after you use it.  Let us know how you do with the inhaler.  We are going to get some breathing test, echocardiogram and chest x-ray again.  See you in follow-up in 4 to 6 weeks time call sooner should any new problems arise.

## 2022-01-17 ENCOUNTER — Telehealth: Payer: Self-pay | Admitting: Pulmonary Disease

## 2022-01-17 ENCOUNTER — Other Ambulatory Visit (HOSPITAL_COMMUNITY): Payer: Self-pay

## 2022-01-17 NOTE — Telephone Encounter (Signed)
Good afternoon! Patient's copay for Trelegy is $482.78. It looks like they are applying $435.78 as her deductible for this claim, but once her deductible is met her copay will be $47.

## 2022-01-17 NOTE — Telephone Encounter (Signed)
Spoke to patient and relayed below message/recommendations. She will contact insurance for a list of cheaper alternatives. She will call back with update.  Nothing further needed.

## 2022-01-18 ENCOUNTER — Telehealth: Payer: Self-pay | Admitting: Family Medicine

## 2022-01-18 NOTE — Telephone Encounter (Signed)
Copied from [place] [id number]. Topic: Medicare AWV >> Jan 18, 2022  2:58 PM Devoria Glassing wrote: Reason for CRM: Left message for patient to schedule Annual Wellness Visit.  Please schedule with Nurse Health Advisor Denisa O'Brien-Blaney, LPN at [facility]. This appt can be telephone or office visit.  Please call [phone] ask for [facility]

## 2022-02-02 ENCOUNTER — Ambulatory Visit (INDEPENDENT_AMBULATORY_CARE_PROVIDER_SITE_OTHER): Payer: Medicare Other

## 2022-02-02 VITALS — Ht 63.0 in | Wt 101.0 lb

## 2022-02-02 DIAGNOSIS — Z0000 Encounter for general adult medical examination without abnormal findings: Secondary | ICD-10-CM | POA: Diagnosis not present

## 2022-02-02 NOTE — Patient Instructions (Addendum)
Anne [name] , Thank you for taking time to come for your Medicare Wellness Visit. I appreciate your ongoing commitment to your health goals. Please review the following plan we discussed and let me know if I can assist you in the future.   These are the goals we discussed:  Goals       Patient Stated     Follow up with Primary Care Provider (pt-stated)      Keep all routine maintenance appointments as scheduled Eat Healthy       Maintain Healthy Lifestyle (pt-stated)      Stay active       Other     Health maintenance      Care Coordination Interventions: Discussed with patient importance of Annual wellness visit Advised patient to call office to schedule Annual wellness visit.           This is a list of the screening recommended for you and due dates:  Health Maintenance  Topic Date Due   Zoster (Shingles) Vaccine (1 of 2) 04/06/2022*   Medicare Annual Wellness Visit  02/03/2023   Mammogram  06/14/2023   Colon Cancer Screening  10/03/2027   Tetanus Vaccine  11/08/2031   Pneumonia Vaccine  Completed   Flu Shot  Completed   DEXA scan (bone density measurement)  Completed   Hepatitis C Screening: USPSTF Recommendation to screen - Ages 18-79 yo.  Completed   HPV Vaccine  Aged Out   COVID-19 Vaccine  Discontinued  *Topic was postponed. The date shown is not the original due date.    Advanced directives: End of life planning; Advance aging; Advanced directives discussed.  Copy of current HCPOA/Living Will requested.    Conditions/risks identified: none new.  Next appointment: Follow up in one year for your annual wellness visit    Preventive Care 65 Years and Older, Female Preventive care refers to lifestyle choices and visits with your health care provider that can promote health and wellness. What does preventive care include? A yearly physical exam. This is also called an annual well check. Dental exams once or twice a year. Routine eye exams. Ask your health  care provider how often you should have your eyes checked. Personal lifestyle choices, including: Daily care of your teeth and gums. Regular physical activity. Eating a healthy diet. Avoiding tobacco and drug use. Limiting alcohol use. Practicing safe sex. Taking low-dose aspirin every day. Taking vitamin and mineral supplements as recommended by your health care provider. What happens during an annual well check? The services and screenings done by your health care provider during your annual well check will depend on your age, overall health, lifestyle risk factors, and family history of disease. Counseling  Your health care provider may ask you questions about your: Alcohol use. Tobacco use. Drug use. Emotional well-being. Home and relationship well-being. Sexual activity. Eating habits. History of falls. Memory and ability to understand (cognition). Work and work Statistician. Reproductive health. Screening  You may have the following tests or measurements: Height, weight, and BMI. Blood pressure. Lipid and cholesterol levels. These may be checked every 5 years, or more frequently if you are over 49 years old. Skin check. Lung cancer screening. You may have this screening every year starting at age 63 if you have a 30-pack-year history of smoking and currently smoke or have quit within the past 15 years. Fecal occult blood test (FOBT) of the stool. You may have this test every year starting at age 64. Flexible sigmoidoscopy or  colonoscopy. You may have a sigmoidoscopy every 5 years or a colonoscopy every 10 years starting at age 11. Hepatitis C blood test. Hepatitis B blood test. Sexually transmitted disease (STD) testing. Diabetes screening. This is done by checking your blood sugar (glucose) after you have not eaten for a while (fasting). You may have this done every 1-3 years. Bone density scan. This is done to screen for osteoporosis. You may have this done starting at  age 57. Mammogram. This may be done every 1-2 years. Talk to your health care provider about how often you should have regular mammograms. Talk with your health care provider about your test results, treatment options, and if necessary, the need for more tests. Vaccines  Your health care provider may recommend certain vaccines, such as: Influenza vaccine. This is recommended every year. Tetanus, diphtheria, and acellular pertussis (Tdap, Td) vaccine. You may need a Td booster every 10 years. Zoster vaccine. You may need this after age 67. Pneumococcal 13-valent conjugate (PCV13) vaccine. One dose is recommended after age 45. Pneumococcal polysaccharide (PPSV23) vaccine. One dose is recommended after age 35. Talk to your health care provider about which screenings and vaccines you need and how often you need them. This information is not intended to replace advice given to you by your health care provider. Make sure you discuss any questions you have with your health care provider. Document Released: 04/09/2015 Document Revised: 12/01/2015 Document Reviewed: 01/12/2015 Elsevier Interactive Patient Education  2017 [place] Prevention in the Home Falls can cause injuries. They can happen to people of all ages. There are many things you can do to make your home safe and to help prevent falls. What can I do on the outside of my home? Regularly fix the edges of walkways and driveways and fix any cracks. Remove anything that might make you trip as you walk through a door, such as a raised step or threshold. Trim any bushes or trees on the path to your home. Use bright outdoor lighting. Clear any walking paths of anything that might make someone trip, such as rocks or tools. Regularly check to see if handrails are loose or broken. Make sure that both sides of any steps have handrails. Any raised decks and porches should have guardrails on the edges. Have any leaves, snow, or ice cleared  regularly. Use sand or salt on walking paths during winter. Clean up any spills in your garage right away. This includes oil or grease spills. What can I do in the bathroom? Use night lights. Install grab bars by the toilet and in the tub and shower. Do not use towel bars as grab bars. Use non-skid mats or decals in the tub or shower. If you need to sit down in the shower, use a plastic, non-slip stool. Keep the floor dry. Clean up any water that spills on the floor as soon as it happens. Remove soap buildup in the tub or shower regularly. Attach bath mats securely with double-sided non-slip rug tape. Do not have throw rugs and other things on the floor that can make you trip. What can I do in the bedroom? Use night lights. Make sure that you have a light by your bed that is easy to reach. Do not use any sheets or blankets that are too big for your bed. They should not hang down onto the floor. Have a firm chair that has side arms. You can use this for support while you get dressed. Do not  have throw rugs and other things on the floor that can make you trip. What can I do in the kitchen? Clean up any spills right away. Avoid walking on wet floors. Keep items that you use a lot in easy-to-reach places. If you need to reach something above you, use a strong step stool that has a grab bar. Keep electrical cords out of the way. Do not use floor polish or wax that makes floors slippery. If you must use wax, use non-skid floor wax. Do not have throw rugs and other things on the floor that can make you trip. What can I do with my stairs? Do not leave any items on the stairs. Make sure that there are handrails on both sides of the stairs and use them. Fix handrails that are broken or loose. Make sure that handrails are as long as the stairways. Check any carpeting to make sure that it is firmly attached to the stairs. Fix any carpet that is loose or worn. Avoid having throw rugs at the top or  bottom of the stairs. If you do have throw rugs, attach them to the floor with carpet tape. Make sure that you have a light switch at the top of the stairs and the bottom of the stairs. If you do not have them, ask someone to add them for you. What else can I do to help prevent falls? Wear shoes that: Do not have high heels. Have rubber bottoms. Are comfortable and fit you well. Are closed at the toe. Do not wear sandals. If you use a stepladder: Make sure that it is fully opened. Do not climb a closed stepladder. Make sure that both sides of the stepladder are locked into place. Ask someone to hold it for you, if possible. Clearly mark and make sure that you can see: Any grab bars or handrails. First and last steps. Where the edge of each step is. Use tools that help you move around (mobility aids) if they are needed. These include: Canes. Walkers. Scooters. Crutches. Turn on the lights when you go into a dark area. Replace any light bulbs as soon as they burn out. Set up your furniture so you have a clear path. Avoid moving your furniture around. If any of your floors are uneven, fix them. If there are any pets around you, be aware of where they are. Review your medicines with your doctor. Some medicines can make you feel dizzy. This can increase your chance of falling. Ask your doctor what other things that you can do to help prevent falls. This information is not intended to replace advice given to you by your health care provider. Make sure you discuss any questions you have with your health care provider. Document Released: 01/07/2009 Document Revised: 08/19/2015 Document Reviewed: 04/17/2014 Elsevier Interactive Patient Education  2017 [place].  Opioid Pain Medicine Management Opioids are powerful medicines that are used to treat moderate to severe pain. When used for short periods of time, they can help you to: Sleep better. Do better in physical or occupational  therapy. Feel better in the first few days after an injury. Recover from surgery. Opioids should be taken with the supervision of a trained health care provider. They should be taken for the shortest period of time possible. This is because opioids can be addictive, and the longer you take opioids, the greater your risk of addiction. This addiction can also be called opioid use disorder. What are the risks? Using opioid pain  medicines for longer than 3 days increases your risk of side effects. Side effects include: Constipation. Nausea and vomiting. Breathing difficulties (respiratory depression). Drowsiness. Confusion. Opioid use disorder. Itching. Taking opioid pain medicine for a long period of time can affect your ability to do daily tasks. It also puts you at risk for: Motor vehicle crashes. Depression. Suicide. Heart attack. Overdose, which can be life-threatening. What is a pain treatment plan? A pain treatment plan is an agreement between you and your health care provider. Pain is unique to each person, and treatments vary depending on your condition. To manage your pain, you and your health care provider need to work together. To help you do this: Discuss the goals of your treatment, including how much pain you might expect to have and how you will manage the pain. Review the risks and benefits of taking opioid medicines. Remember that a good treatment plan uses more than one approach and minimizes the chance of side effects. Be honest about the amount of medicines you take and about any drug or alcohol use. Get pain medicine prescriptions from only one health care provider. Pain can be managed with many types of alternative treatments. Ask your health care provider to refer you to one or more specialists who can help you manage pain through: Physical or occupational therapy. Counseling (cognitive behavioral therapy). Good  nutrition. Biofeedback. Massage. Meditation. Non-opioid medicine. Following a gentle exercise program. How to use opioid pain medicine Taking medicine Take your pain medicine exactly as told by your health care provider. Take it only when you need it. If your pain gets less severe, you may take less than your prescribed dose if your health care provider approves. If you are not having pain, do nottake pain medicine unless your health care provider tells you to take it. If your pain is severe, do nottry to treat it yourself by taking more pills than instructed on your prescription. Contact your health care provider for help. Write down the times when you take your pain medicine. It is easy to become confused while on pain medicine. Writing the time can help you avoid overdose. Take other over-the-counter or prescription medicines only as told by your health care provider. Keeping yourself and others safe  While you are taking opioid pain medicine: Do not drive, use machinery, or power tools. Do not sign legal documents. Do not drink alcohol. Do not take sleeping pills. Do not supervise children by yourself. Do not do activities that require climbing or being in high places. Do not go to a lake, river, ocean, spa, or swimming pool. Do not share your pain medicine with anyone. Keep pain medicine in a locked cabinet or in a secure area where pets and children cannot reach it. Stopping your use of opioids If you have been taking opioid medicine for more than a few weeks, you may need to slowly decrease (taper) how much you take until you stop completely. Tapering your use of opioids can decrease your risk of symptoms of withdrawal, such as: Pain and cramping in the abdomen. Nausea. Sweating. Sleepiness. Restlessness. Uncontrollable shaking (tremors). Cravings for the medicine. Do not attempt to taper your use of opioids on your own. Talk with your health care provider about how to do  this. Your health care provider may prescribe a step-down schedule based on how much medicine you are taking and how long you have been taking it. Getting rid of leftover pills Do not save any leftover pills. Get rid of  leftover pills safely by: Taking the medicine to a prescription take-back program. This is usually offered by the county or law enforcement. Bringing them to a pharmacy that has a drug disposal container. Flushing them down the toilet. Check the label or package insert of your medicine to see whether this is safe to do. Throwing them out in the trash. Check the label or package insert of your medicine to see whether this is safe to do. If it is safe to throw it out, remove the medicine from the original container, put it into a sealable bag or container, and mix it with used coffee grounds, food scraps, dirt, or cat litter before putting it in the trash. Follow these instructions at home: Activity Do exercises as told by your health care provider. Avoid activities that make your pain worse. Return to your normal activities as told by your health care provider. Ask your health care provider what activities are safe for you. General instructions You may need to take these actions to prevent or treat constipation: Drink enough fluid to keep your urine pale yellow. Take over-the-counter or prescription medicines. Eat foods that are high in fiber, such as beans, whole grains, and fresh fruits and vegetables. Limit foods that are high in fat and processed sugars, such as fried or sweet foods. Keep all follow-up visits. This is important. Where to find support If you have been taking opioids for a long time, you may benefit from receiving support for quitting from a local support group or counselor. Ask your health care provider for a referral to these resources in your area. Where to find more information Centers for Disease Control and Prevention (CDC): http://www.wolf.info/ [place] Food and  Drug Administration (FDA): [place] Get help right away if: You may have taken too much of an opioid (overdosed). Common symptoms of an overdose: Your breathing is slower or more shallow than normal. You have a very slow heartbeat (pulse). You have slurred speech. You have nausea and vomiting. Your pupils become very small. You have other potential symptoms: You are very confused. You faint or feel like you will faint. You have cold, clammy skin. You have blue lips or fingernails. You have thoughts of harming yourself or harming others. These symptoms may represent a serious problem that is an emergency. Do not wait to see if the symptoms will go away. Get medical help right away. Call your local emergency services (911 in the [place] Do not drive yourself to the hospital.  If you ever feel like you may hurt yourself or others, or have thoughts about taking your own life, get help right away. Go to your nearest emergency department or: Call your local emergency services (911 in the [place] Call the [facility] 208-678-1350 in the [place] Call a suicide crisis helpline, such as the [place] at 234 843 6900 or 988 in the [place]. This is open 24 hours a day in the [place] Text the Crisis Text Line at 954-443-9945 (in the [place].). Summary Opioid medicines can help you manage moderate to severe pain for a short period of time. A pain treatment plan is an agreement between you and your health care provider. Discuss the goals of your treatment, including how much pain you might expect to have and how you will manage the pain. If you think that you or someone else may have taken too much of an opioid, get medical help right away. This information is not intended to replace advice given to  you by your health care provider. Make sure you discuss any questions you have with your health care provider. Document Revised: 10/06/2020 Document Reviewed:  06/23/2020 Elsevier Patient Education  [place].

## 2022-02-02 NOTE — Progress Notes (Signed)
Subjective:   Anne [name] is a 71 y.o. female who presents for Medicare Annual (Subsequent) preventive examination.  Review of Systems    No ROS.  Medicare Wellness Virtual Visit.  Visual/audio telehealth visit, UTA vital signs.   See social history for additional risk factors.   Cardiac Risk Factors include: advanced age (>2mn, >>56women)     Objective:    Today's Vitals   02/02/22 1042  Weight: 101 lb (45.8 kg)  Height: '5\' 3"'$  (1.6 m)   Body mass index is 17.89 kg/m.     02/02/2022   10:39 AM 09/14/2021    6:49 AM 09/07/2021    5:46 PM 06/16/2021    6:25 AM 12/16/2020    2:02 PM 10/27/2020   10:30 AM 08/05/2020    9:42 AM  Advanced Directives  Does Patient Have a Medical Advance Directive? Yes Yes Yes No Yes Yes Yes  Type of AParamed[place]of [place] will [place] will    [place] will [place] will  Does patient want to make changes to medical advance directive? No - Patient declined No - Patient declined   No - Patient declined No - Patient declined   Copy of HPrincess Annein Chart? No - copy requested No - copy requested     No - copy requested  Would patient like information on creating a medical advance directive?    No - Patient declined       Current Medications (verified) Outpatient Encounter Medications as of 02/02/2022  Medication Sig   acetaminophen (TYLENOL) 500 MG tablet Take 1,000 mg by mouth 2 (two) times daily as needed for moderate pain or headache.   albuterol (VENTOLIN HFA) 108 (90 Base) MCG/ACT inhaler Inhale 2 puffs into the lungs every 6 (six) hours as needed for wheezing or shortness of breath.   azelastine (ASTELIN) 0.1 % nasal spray Place 1 spray into both nostrils 2 (two) times daily. Use in each nostril as directed (Patient taking differently: Place 1 spray into both nostrils as needed for allergies. Use in each nostril as  directed)   Budeson-Glycopyrrol-Formoterol (BREZTRI AEROSPHERE) 160-9-4.8 MCG/ACT AERO Inhale 2 puffs into the lungs in the morning and at bedtime.   cetirizine (ZYRTEC) 10 MG tablet TAKE 1 TABLET BY MOUTH EVERY DAY (NOT COVERED   Cyanocobalamin (VITAMIN B-12 IJ) Inject 1 Dose as directed every 30 (thirty) days.   diphenhydrAMINE (BENADRYL) 25 MG tablet Take 50 mg by mouth daily as needed for allergies (Asthma).   EPINEPHrine 0.3 mg/0.3 mL IJ SOAJ injection Inject 0.3 mg into the muscle as needed for anaphylaxis.    estradiol (ESTRACE) 1 MG tablet TAKE 1 TABLET BY MOUTH EVERY DAY   Fluticasone-Umeclidin-Vilant (TRELEGY ELLIPTA) 100-62.5-25 MCG/ACT AEPB Inhale 1 puff into the lungs daily.   furosemide (LASIX) 20 MG tablet TAKE 1 TABLET BY MOUTH EVERY DAY AS NEEDED (Patient taking differently: Take 20 mg by mouth every morning. TAKE 1 TABLET BY MOUTH EVERY DAY AS NEEDED)   guaiFENesin-codeine 100-10 MG/5ML syrup Take 5 mLs by mouth 2 (two) times daily as needed for cough.   ibuprofen (ADVIL,MOTRIN) 200 MG tablet Take 400-800 mg by mouth daily as needed for headache or moderate pain.   ipratropium-albuterol (DUONEB) 0.5-2.5 (3) MG/3ML SOLN Take 3 mLs by nebulization every 4 (four) hours as needed. Dx 496   methocarbamol (ROBAXIN) 500 MG tablet Take 500 mg by mouth 4 (four) times daily.  montelukast (SINGULAIR) 10 MG tablet TAKE 1 TABLET BY MOUTH EVERYDAY AT BEDTIME   Naphazoline-Pheniramine 0.027-0.315 % SOLN Place 1 drop into both eyes daily as needed (allergies).    oxyCODONE (OXY IR/ROXICODONE) 5 MG immediate release tablet Take 5 mg by mouth 2 (two) times daily as needed.   pantoprazole (PROTONIX) 40 MG tablet TAKE ONE TABLET EVERY DAY NEEDS FOR OFFICE USE VISIT FOR MORE REFILLS.   Pseudoephedrine HCl (SUDAFED PO) Take 1 tablet by mouth daily as needed (cold symptons).   rizatriptan (MAXALT-MLT) 10 MG disintegrating tablet TAKE 1 TABLET (10 MG TOTAL) BY MOUTH DAILY AS NEEDED FOR MIGRAINE. MAY  REPEAT FOR ONE DOSE 2 HOURS AFTER THE FIRST ONE IF NEEDED.   sodium chloride (OCEAN) 0.65 % SOLN nasal spray Place 1 spray into both nostrils every 4 (four) hours as needed for congestion.    White Petrolatum-Mineral Oil (LUBRICANT EYE) OINT Place 1 application into both eyes 2 (two) times daily as needed (for dry eyes).    [DISCONTINUED] zolmitriptan (ZOMIG-ZMT) 5 MG disintegrating tablet Take 1 tablet (5 mg total) by mouth daily as needed for migraine.   No facility-administered encounter medications on file as of 02/02/2022.    Allergies (verified) Asa [aspirin], Bee venom, Epinephrine, Feraheme [ferumoxytol], Influenza vaccines, Pfizer-biontech covid-19 vacc [covid-19 mrna vacc (moderna)], Quinolones, Shellfish allergy, Ciprofloxacin, Clarithromycin, Erythromycin, Levaquin [levofloxacin in d5w], Peanut-containing drug products, Septra [sulfamethoxazole-trimethoprim], Telithromycin, Zanaflex [tizanidine], and Adhesive [tape]   History: Past Medical History:  Diagnosis Date   Achalasia of esophagus    Allergic rhinitis    Allergy    multiple, mostly aspirin, levaquin and shellfish.   Anemia    Anxiety    Arthritis    Asthma    Bilateral leg edema    Cataract 2014   exraction with len implant   Chronic pain syndrome    Chronic pulmonary aspiration    Complication of anesthesia    Breathing problems upon waking up. 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   Compressed cervical disc    COPD (chronic obstructive pulmonary disease) (HCC)    CVA (cerebral infarction)    Dyspnea    Esophageal dysmotility    Gastritis    H/O food anaphylaxis    Heart murmur    as child   History of hiatal hernia    Hypokalemia    IBS (irritable bowel syndrome)    Kidney lesion 2018   Left   Liver lesion    Migraine    PICC (peripherally inserted central catheter) removal 02/20/2017   PONV (postoperative nausea and vomiting)    Problems with swallowing    intermittently    Pulmonary fibrosis (HCC)    Renal cell carcinoma (HCC)    small- left kidney   Seizures ([place])    only in elementary school-was on medication and was tx up until 5th grade and has had no more seizures since then   Shingles    Stroke ([place])    slurred speech, drawn face, imaging normal, occurred twice, [facility]-"TIA" if antything" 02/20/17-no residual effects   Tracheal granulation    Tracheostomy in place [facility])    Vocal cord paresis    Past Surgical History:  Procedure Laterality Date   ABDOMINAL HYSTERECTOMY     APPLICATION OF A-CELL OF HEAD/NECK N/A 02/21/2017   Procedure: APPLICATION OF A-CELL OF HEAD/NECK;  Surgeon: Wallace Going, DO;  Location: [place];  Service: Plastics;  Laterality: N/A;   BACK SURGERY  BOTOX INJECTION N/A 07/29/2013   Procedure: BOTOX INJECTION;  Surgeon: Jerene Bears, MD;  [facility];  Service: Gastroenterology;  Laterality: N/A;   BOTOX INJECTION N/A 05/04/2015   Procedure: BOTOX INJECTION;  Surgeon: Jerene Bears, MD;  [facility];  Service: Gastroenterology;  Laterality: N/A;   BOTOX INJECTION N/A 02/18/2018   Procedure: BOTOX INJECTION;  Surgeon: Jerene Bears, MD;  [facility];  Service: Gastroenterology;  Laterality: N/A;   BOTOX INJECTION N/A 06/30/2019   Procedure: BOTOX INJECTION;  Surgeon: Jerene Bears, MD;  [facility];  Service: Gastroenterology;  Laterality: N/A;   BOTOX INJECTION N/A 08/05/2020   Procedure: BOTOX INJECTION;  Surgeon: Jerene Bears, MD;  [facility];  Service: Gastroenterology;  Laterality: N/A;   BOTOX INJECTION N/A 06/16/2021   Procedure: BOTOX INJECTION;  Surgeon: Jerene Bears, MD;  [facility];  Service: Gastroenterology;  Laterality: N/A;   BREAST BIOPSY Left    neg   BREAST SURGERY Left 2002   bx of skin   CHOLECYSTECTOMY     COLONOSCOPY     DIAGNOSTIC LAPAROSCOPY     ESOPHAGEAL MANOMETRY N/A 12/16/2012   Procedure: ESOPHAGEAL MANOMETRY (EM);   Surgeon: Jerene Bears, MD;  [facility];  Service: Gastroenterology;  Laterality: N/A;   ESOPHAGOGASTRODUODENOSCOPY (EGD) WITH PROPOFOL N/A 07/29/2013   Procedure: ESOPHAGOGASTRODUODENOSCOPY (EGD) WITH PROPOFOL;  Surgeon: Jerene Bears, MD;  [facility];  Service: Gastroenterology;  Laterality: N/A;  with botox injection   ESOPHAGOGASTRODUODENOSCOPY (EGD) WITH PROPOFOL N/A 05/04/2015   Procedure: ESOPHAGOGASTRODUODENOSCOPY (EGD) WITH PROPOFOL;  Surgeon: Jerene Bears, MD;  [facility];  Service: Gastroenterology;  Laterality: N/A;   ESOPHAGOGASTRODUODENOSCOPY (EGD) WITH PROPOFOL N/A 02/18/2018   Procedure: ESOPHAGOGASTRODUODENOSCOPY (EGD) WITH PROPOFOL;  Surgeon: Jerene Bears, MD;  [facility];  Service: Gastroenterology;  Laterality: N/A;   ESOPHAGOGASTRODUODENOSCOPY (EGD) WITH PROPOFOL N/A 06/30/2019   Procedure: ESOPHAGOGASTRODUODENOSCOPY (EGD) WITH PROPOFOL;  Surgeon: Jerene Bears, MD;  [facility];  Service: Gastroenterology;  Laterality: N/A;   ESOPHAGOGASTRODUODENOSCOPY (EGD) WITH PROPOFOL N/A 08/05/2020   Procedure: ESOPHAGOGASTRODUODENOSCOPY (EGD) WITH PROPOFOL;  Surgeon: Jerene Bears, MD;  [facility];  Service: Gastroenterology;  Laterality: N/A;   ESOPHAGOGASTRODUODENOSCOPY (EGD) WITH PROPOFOL N/A 06/16/2021   Procedure: ESOPHAGOGASTRODUODENOSCOPY (EGD) WITH PROPOFOL;  Surgeon: Jerene Bears, MD;  [facility];  Service: Gastroenterology;  Laterality: N/A;   EYE SURGERY     Catarct surgery 2014   Eye Surgery AS Child Left    INCISION AND DRAINAGE OF WOUND N/A 02/21/2017   Procedure: IRRIGATION AND DEBRIDEMENT WOUND NECK;  Surgeon: Wallace Going, DO;  Location: [place];  Service: Plastics;  Laterality: N/A;   JEJUNOSTOMY FEEDING TUBE     x2 both failed. no longer has   LUMBAR LAMINECTOMY/DECOMPRESSION MICRODISCECTOMY Bilateral 05/13/2018   Procedure: Laminectomy and Foraminotomy - Lumbar four-Lumbar five-  bilateral;  Surgeon: Eustace Moore, MD;  Location: [place];  Service: Neurosurgery;  Laterality: Bilateral;   LUMBAR LAMINECTOMY/DECOMPRESSION MICRODISCECTOMY Bilateral 11/08/2020   Procedure: Laminectomy and Foraminotomy - bilateral - Lumbar two-Lumbar three - Lumbar three-Lumbar four;  Surgeon: Eustace Moore, MD;  Location: [place];  Service: Neurosurgery;  Laterality: Bilateral;   MULTIPLE TOOTH EXTRACTIONS     2 teeth removed   OOPHORECTOMY     POSTERIOR CERVICAL FUSION/FORAMINOTOMY N/A 01/10/2017   Procedure: LAMINECTOMY AND FORAMINOTOMY CERVICAL FOUR-CERVICAL FIVE, CERVICAL FIVE-SIX POSTERIOR CERVICAL INSTRUMENT FUSION CERVICAL THREE-CERVICAL SEVEN,CERVICAL LAMINECTOMY CERVICAL  [place].;  Surgeon: Eustace Moore, MD;  Location: [place];  Service: Neurosurgery;  Laterality: N/A;  posterior   TONSILLECTOMY     TRACHEOSTOMY  1996   [facility] at [facility], Dr. Kathyrn Sheriff   TRACHEOSTOMY REVISION N/A 09/14/2021   Procedure: TRACHEOSTOMA REVISION, TRACHEOPLASTY;  Surgeon: Margaretha Sheffield, MD;  Location: [facility];  Service: ENT;  Laterality: N/A;   TUBAL LIGATION     VIDEO BRONCHOSCOPY Bilateral 11/20/2012   Procedure: VIDEO BRONCHOSCOPY WITH FLUORO;  Surgeon: Juanito Doom, MD;  [facility];  Service: Cardiopulmonary;  Laterality: Bilateral;   Family History  Problem Relation Age of Onset   Asthma Cousin    COPD Cousin    Breast cancer Maternal Grandmother 41   Asthma Father    Kidney cancer Father    Arrhythmia Father    Lung cancer Father    Lung cancer Paternal Uncle    COPD Paternal Grandfather    Alcohol abuse Mother    Breast cancer Maternal Aunt 49   Bladder Cancer Neg Hx    Colon cancer Neg Hx    Esophageal cancer Neg Hx    Pancreatic cancer Neg Hx    Stomach cancer Neg Hx    Liver disease Neg Hx    Social History   Socioeconomic History   Marital status: Married    Spouse name: Not on file   Number of children: 4   Years of education: Not on file   Highest  education level: Not on file  Occupational History   Not on file  Tobacco Use   Smoking status: Never   Smokeless tobacco: Never  Vaping Use   Vaping Use: Never used  Substance and Sexual Activity   Alcohol use: Yes    Comment: occ wine   Drug use: No   Sexual activity: Yes  Other Topics Concern   Not on file  Social History Narrative   Lives in [place] with husband.  She has four children.   Retired from [employer]: [place]  (02/02/2022)   Overall Financial Resource Strain (CARDIA)    Difficulty of Paying Living Expenses: Not hard at all  Food Insecurity: No Food Insecurity (02/02/2022)   Hunger Vital Sign    Worried About Running Out of Food in the Last Year: Never true    False Pass in the Last Year: Never true  Transportation Needs: No Transportation Needs (02/02/2022)   PRAPARE - Hydrologist (Medical): No    Lack of Transportation (Non-Medical): No  Physical Activity: Sufficiently Active (02/02/2022)   Exercise Vital Sign    Days of Exercise per Week: 5 days    Minutes of Exercise per Session: 30 min  Stress: No Stress Concern Present (02/02/2022)   [place]    Feeling of Stress : Not at all  Social Connections: Unknown (02/02/2022)   Social Connection and Isolation Panel [NHANES]    Frequency of Communication with Friends and Family: More than three times a week    Frequency of Social Gatherings with Friends and Family: More than three times a week    Attends Religious Services: Not on file    Active Member of Clubs or Organizations: Yes    Attends Archivist Meetings: More than 4 times per year    Marital Status:  Married    Tobacco Counseling Counseling given: Not Answered   Clinical Intake:  Pre-visit preparation completed: Yes        Diabetes: No  How often do you need  to have someone help you when you read instructions, pamphlets, or other written materials from your doctor or pharmacy?: 1 - Never   Interpreter Needed?: No      Activities of Daily Living    02/02/2022   10:46 AM 09/07/2021    5:45 PM  In your present state of health, do you have any difficulty performing the following activities:  Hearing? 1   Comment Hearing aids   Vision? 0   Difficulty concentrating or making decisions? 0   Comment Paces self with activity   Dressing or bathing? 0   Doing errands, shopping? 0 0  Preparing Food and eating ? N   Using the Toilet? N   In the past six months, have you accidently leaked urine? N   Do you have problems with loss of bowel control? N   Managing your Medications? N   Managing your Finances? N   Housekeeping or managing your Housekeeping? N     Patient Care Team: Anne Haven, MD as PCP - General (Family Medicine) Wellington Hampshire, MD as PCP - Cardiology (Cardiology) Nickie Retort, MD (Inactive) as Consulting Physician (Urology) Pyrtle, Lajuan Lines, MD as Consulting Physician (Gastroenterology) Tyler Pita, MD as Consulting Physician (Pulmonary Disease)  Indicate any recent Medical Services you may have received from other than [facility] providers in the past year (date may be approximate).     Assessment:   This is a routine wellness examination for Anne [name].  I connected with  Anne [name] on 02/02/22 by a audio enabled telemedicine application and verified that I am speaking with the correct person using two identifiers.  Patient Location: Home  Provider Location: Office/Clinic  I discussed the limitations of evaluation and management by telemedicine. The patient expressed understanding and agreed to proceed.   Hearing/Vision screen Hearing Screening - Comments:: Hearing aids Vision Screening - Comments:: Followed [facility], Dr. Jeni Salles  Wears corrective lenses Cataract extraction, L  eye They have seen their ophthalmologist in the last 12 months.    Dietary issues and exercise activities discussed: Current Exercise Habits: Home exercise routine, Type of exercise: stretching;walking, Time (Minutes): 30, Frequency (Times/Week): 5, Weekly Exercise (Minutes/Week): 150, Intensity: Mild Regular diet Good water intake   Goals Addressed               This Visit's Progress     Patient Stated     Maintain Healthy Lifestyle (pt-stated)   On track     Stay active        Depression Screen    02/02/2022   10:53 AM 01/09/2022    4:24 PM 11/07/2021    3:40 PM 09/06/2021    8:35 AM 03/11/2021    9:09 AM 12/16/2020    1:40 PM 04/19/2020    2:52 PM  PHQ 2/9 Scores  PHQ - 2 Score 0 0 0 0 0 0 0    Fall Risk    02/02/2022   10:55 AM 01/09/2022    4:24 PM 11/07/2021    3:40 PM 09/06/2021    8:35 AM 01/20/2021    3:07 PM  Fall Risk   Falls in the past year? 0 0 0 0 0  Number falls in past yr: 0 0 0 0 0  Injury  with Fall? 0 0 0 0 0  Risk for fall due to :  No Fall Risks No Fall Risks No Fall Risks No Fall Risks  Follow up Falls evaluation completed;Falls prevention discussed Falls evaluation completed Falls evaluation completed Falls evaluation completed Falls evaluation completed    FALL RISK PREVENTION PERTAINING TO THE HOME: Home free of loose throw rugs in walkways, pet beds, electrical cords, etc? Yes  Adequate lighting in your home to reduce risk of falls? Yes   ASSISTIVE DEVICES UTILIZED TO PREVENT FALLS: Life alert? No  Use of a cane, walker or w/c? No  Grab bars in the bathroom? No  Shower chair or bench in shower? No  Elevated toilet seat or a handicapped toilet? No   TIMED UP AND GO: Was the test performed? No .   Cognitive Function:    10/14/2019    8:57 AM 10/09/2017   10:11 AM  MMSE - Mini Mental State Exam  Not completed: Unable to complete   Orientation to time  5  Orientation to Place  5  Registration  3  Attention/ Calculation  5   Recall  3  Language- name 2 objects  2  Language- repeat  1  Language- follow 3 step command  3  Language- read & follow direction  1  Write a sentence  1  Copy design  1  Total score  30        02/02/2022   10:55 AM 10/11/2018    8:53 AM  6CIT Screen  What Year? 0 points 0 points  What month? 0 points 0 points  What time? 0 points 0 points  Count back from 20 0 points 0 points  Months in reverse 0 points 0 points  Repeat phrase 0 points 0 points  Total Score 0 points 0 points    Immunizations Immunization History  Administered Date(s) Administered   Fluad Quad(high Dose 65+) 02/14/2021   Influenza Inj Mdck Quad Pf 01/15/2019, 01/16/2020, 12/15/2021   Influenza, Quadrivalent, Recombinant, Inj, Pf 02/06/2017, 12/19/2017   Influenza,inj,Quad PF,6+ Mos 12/19/2012   Influenza,trivalent, recombinat, inj, PF 01/05/2014   Janssen (J&J) SARS-COV-2 Vaccination 01/05/2020, 03/10/2020   PFIZER(Purple Top)SARS-COV-2 Vaccination 05/09/2019   Pneumococcal Conjugate-13 01/23/2014   Pneumococcal Polysaccharide-23 03/11/2021   Td 11/07/2021   Screening Tests Health Maintenance  Topic Date Due   Zoster Vaccines- Shingrix (1 of 2) 04/06/2022 (Originally 09/07/1999)   Medicare Annual Wellness (AWV)  02/03/2023   MAMMOGRAM  06/14/2023   COLONOSCOPY (Pts 45-8yr Insurance coverage will need to be confirmed)  10/03/2027   TETANUS/TDAP  11/08/2031   Pneumonia Vaccine 72 Years old  Completed   INFLUENZA VACCINE  Completed   DEXA SCAN  Completed   Hepatitis C Screening  Completed   HPV VACCINES  Aged Out   COVID-19 Vaccine  Discontinued   Health Maintenance There are no preventive care reminders to display for this patient.  Lung Cancer Screening: (Low Dose CT Chest recommended if Age 72-80years, 30 pack-year currently smoking OR have quit w/in 15years.) does not qualify.   Hepatitis C Screening: Completed 2017.  Vision Screening: Recommended annual ophthalmology exams for early  detection of glaucoma and other disorders of the eye.  Dental Screening: Recommended annual dental exams for proper oral hygiene.  Community Resource Referral / Chronic Care Management: CRR required this visit?  No   CCM required this visit?  No      Plan:     I have personally reviewed and noted  the following in the patient's chart:   Medical and social history Use of alcohol, tobacco or illicit drugs  Current medications and supplements including opioid prescriptions. Patient is currently taking opioid prescriptions. Information provided to patient regarding non-opioid alternatives. Patient advised to discuss non-opioid treatment plan with their provider. Followed Dr. Sharlet Salina Functional ability and status Nutritional status Physical activity Advanced directives List of other physicians Hospitalizations, surgeries, and ER visits in previous 12 months Vitals Screenings to include cognitive, depression, and falls Referrals and appointments  In addition, I have reviewed and discussed with patient certain preventive protocols, quality metrics, and best practice recommendations. A written personalized care plan for preventive services as well as general preventive health recommendations were provided to patient.     Leta Jungling, LPN   19/11/1442

## 2022-02-06 ENCOUNTER — Ambulatory Visit (INDEPENDENT_AMBULATORY_CARE_PROVIDER_SITE_OTHER): Payer: Medicare Other

## 2022-02-06 DIAGNOSIS — E538 Deficiency of other specified B group vitamins: Secondary | ICD-10-CM | POA: Diagnosis not present

## 2022-02-06 MED ORDER — CYANOCOBALAMIN 1000 MCG/ML IJ SOLN
1000.0000 ug | Freq: Once | INTRAMUSCULAR | Status: AC
  Administered 2022-02-06: 1000 ug via INTRAMUSCULAR

## 2022-02-06 NOTE — Progress Notes (Signed)
Pt arrived for B12 injection, given in R deltoid. Pt tolerated injection well, showed no signs of distress nor voiced any concerns.  

## 2022-02-10 ENCOUNTER — Encounter: Payer: Self-pay | Admitting: Family

## 2022-02-10 ENCOUNTER — Ambulatory Visit (INDEPENDENT_AMBULATORY_CARE_PROVIDER_SITE_OTHER): Payer: Medicare Other | Admitting: Family

## 2022-02-10 VITALS — BP 118/70 | HR 68 | Temp 97.8°F | Wt 100.4 lb

## 2022-02-10 DIAGNOSIS — M2550 Pain in unspecified joint: Secondary | ICD-10-CM

## 2022-02-10 MED ORDER — DULOXETINE HCL 30 MG PO CPEP: 30.0000 mg | ORAL_CAPSULE | Freq: Every day | ORAL | 1 refills | Status: DC

## 2022-02-10 NOTE — Patient Instructions (Signed)
Start Cymbalta 30 mg daily.  This medication specific for chronic pain, however as discussed also treats depression and anxiety.  We may increase at follow-up.    Nice to meet you!  Duloxetine Delayed-Release Capsules What is this medication? DULOXETINE (doo LOX e teen) treats depression, anxiety, fibromyalgia, and certain types of chronic pain such as nerve, bone, or joint pain. It increases the amount of serotonin and norepinephrine in the brain, hormones that help regulate mood and pain. It belongs to a group of medications called SNRIs. This medicine may be used for other purposes; ask your health care provider or pharmacist if you have questions. COMMON BRAND NAME(S): Cymbalta, Drizalma, Irenka What should I tell my care team before I take this medication? They need to know if you have any of these conditions: Bipolar disorder Glaucoma High blood pressure Kidney disease Liver disease Seizures Suicidal thoughts, plans or attempt; a previous suicide attempt by you or a family member Take medications that treat or prevent blood clots Taken medications called MAOIs like Carbex, Eldepryl, Marplan, Nardil, and Parnate within 14 days Trouble passing urine An unusual reaction to duloxetine, other medications, foods, dyes, or preservatives Pregnant or trying to get pregnant Breast-feeding How should I use this medication? Take this medication by mouth with a glass of water. Follow the directions on the prescription label. Do not crush, cut or chew some capsules of this medication. Some capsules may be opened and sprinkled on applesauce. Check with your care team or pharmacist if you are not sure. You can take this medication with or without food. Take your medication at regular intervals. Do not take your medication more often than directed. Do not stop taking this medication suddenly except upon the advice of your care team. Stopping this medication too quickly may cause serious side effects  or your condition may worsen. A special MedGuide will be given to you by the pharmacist with each prescription and refill. Be sure to read this information carefully each time. Talk to your care team regarding the use of this medication in children. While this medication may be prescribed for children as young as 88 years of age for selected conditions, precautions do apply. Overdosage: If you think you have taken too much of this medicine contact a poison control center or emergency room at once. NOTE: This medicine is only for you. Do not share this medicine with others. What if I miss a dose? If you miss a dose, take it as soon as you can. If it is almost time for your next dose, take only that dose. Do not take double or extra doses. What may interact with this medication? Do not take this medication with any of the following: Desvenlafaxine Levomilnacipran Linezolid MAOIs like Carbex, Eldepryl, Emsam, Marplan, Nardil, and Parnate Methylene blue (injected into a vein) Milnacipran Safinamide Thioridazine Venlafaxine Viloxazine This medication may also interact with the following: Alcohol Amphetamines Aspirin and aspirin-like medications Certain antibiotics like ciprofloxacin and enoxacin Certain medications for blood pressure, heart disease, irregular heart beat Certain medications for depression, anxiety, or psychotic disturbances Certain medications for migraine headache like almotriptan, eletriptan, frovatriptan, naratriptan, rizatriptan, sumatriptan, zolmitriptan Certain medications that treat or prevent blood clots like warfarin, enoxaparin, and dalteparin Cimetidine Fentanyl Lithium NSAIDS, medications for pain and inflammation, like ibuprofen or naproxen Phentermine Procarbazine Rasagiline Sibutramine St. John's wort Theophylline Tramadol Tryptophan This list may not describe all possible interactions. Give your health care provider a list of all the medicines, herbs,  non-prescription drugs, or  dietary supplements you use. Also tell them if you smoke, drink alcohol, or use illegal drugs. Some items may interact with your medicine. What should I watch for while using this medication? Tell your care team if your symptoms do not get better or if they get worse. Visit your care team for regular checks on your progress. Because it may take several weeks to see the full effects of this medication, it is important to continue your treatment as prescribed by your care team. This medication may cause serious skin reactions. They can happen weeks to months after starting the medication. Contact your care team right away if you notice fevers or flu-like symptoms with a rash. The rash may be red or purple and then turn into blisters or peeling of the skin. Or, you might notice a red rash with swelling of the face, lips, or lymph nodes in your neck or under your arms. Watch for new or worsening thoughts of suicide or depression. This includes sudden changes in mood, behaviors, or thoughts. These changes can happen at any time but are more common in the beginning of treatment or after a change in dose. Call your care team right away if you experience these thoughts or worsening depression. Manic episodes may happen in patients with bipolar disorder who take this medication. Watch for changes in feelings or behaviors such as feeling anxious, nervous, agitated, panicky, irritable, hostile, aggressive, impulsive, severely restless, overly excited and hyperactive, or trouble sleeping. These symptoms can happen at any time, but are more common in the beginning of treatment or after a change in dose. Call your care team right away if you notice any of these symptoms. You may get drowsy or dizzy. Do not drive, use machinery, or do anything that needs mental alertness until you know how this medication affects you. Do not stand or sit up quickly, especially if you are an older patient. This  reduces the risk of dizzy or fainting spells. Alcohol may interfere with the effect of this medication. Avoid alcoholic drinks. This medication may increase blood sugar. The risk may be higher in patients who already have diabetes. Ask your care team what you can do to lower your risk of diabetes while taking this medication. This medication can cause an increase in blood pressure. This medication can also cause a sudden drop in your blood pressure, which may make you feel faint and increase the chance of a fall. These effects are most common when you first start the medication or when the dose is increased, or during use of other medications that can cause a sudden drop in blood pressure. Check with your care team for instructions on monitoring your blood pressure while taking this medication. Your mouth may get dry. Chewing sugarless gum or sucking hard candy, and drinking plenty of water, may help. Contact your care team if the problem does not go away or is severe. What side effects may I notice from receiving this medication? Side effects that you should report to your care team as soon as possible: Allergic reactions--skin rash, itching, hives, swelling of the face, lips, tongue, or throat Bleeding--bloody or black, tar-like stools, red or dark brown urine, vomiting blood or brown material that looks like coffee grounds, small, red or purple spots on skin, unusual bleeding or bruising Increase in blood pressure Liver injury--right upper belly pain, loss of appetite, nausea, light-colored stool, dark yellow or brown urine, yellowing skin or eyes, unusual weakness or fatigue Low sodium level--muscle weakness,  fatigue, dizziness, headache, confusion Redness, blistering, peeling, or loosening of the skin, including inside the mouth Serotonin syndrome--irritability, confusion, fast or irregular heartbeat, muscle stiffness, twitching muscles, sweating, high fever, seizures, chills, vomiting,  diarrhea Sudden eye pain or change in vision such as blurry vision, seeing halos around lights, vision loss Thoughts of suicide or self-harm, worsening mood, feelings of depression Trouble passing urine Side effects that usually do not require medical attention (report to your care team if they continue or are bothersome): Change in sex drive or performance Constipation Diarrhea Dizziness Dry mouth Excessive sweating Loss of appetite Nausea Vomiting This list may not describe all possible side effects. Call your doctor for medical advice about side effects. You may report side effects to FDA at 1-800-FDA-1088. Where should I keep my medication? Keep out of the reach of children and pets. Store at room temperature between 15 and 30 degrees C (59 to 86 degrees F). Get rid of any unused medication after the expiration date. To get rid of medications that are no longer needed or have expired: Take the medication to a medication take-back program. Check with your pharmacy or law enforcement to find a location. If you cannot return the medication, check the label or package insert to see if the medication should be thrown out in the garbage or flushed down the toilet. If you are not sure, ask your care team. If it is safe to put it in the trash, take the medication out of the container. Mix the medication with cat litter, dirt, coffee grounds, or other unwanted substance. Seal the mixture in a bag or container. Put it in the trash. NOTE: This sheet is a summary. It may not cover all possible information. If you have questions about this medicine, talk to your doctor, pharmacist, or health care provider.  2023 [employer] (2020-03-01 00:00:00)

## 2022-02-10 NOTE — Progress Notes (Signed)
Subjective:    Patient ID: Anne [name], female    DOB: [birth year], 71 y.o.   MRN: [id number]  CC: Anne [name] is a 71 y.o. female who presents today for an acute visit.    HPI: Complains of left knee, bilateral feet , left elbow, bilateral wrists for over a month. She feels at times there may be some swelling.  No injury She is walking daily, stretching daily.   The past couple of weeks she has taken tramadol '25mg'$  daily improves the pain. She usually takes tramadol more rarely.   B12 improves the pain.   She takes NSAIDs on occasionally however takes tylenol more often  Endorses stiffness. Improves with walking/exercise.  No fever, redness, numbness.   She works on [employer].    Previously negative RA, ANA 9 years ago, 08/13/12  Consult 07/2014 Dr Jefm Bryant who suspected lumbar disc disease.   History of migraine, asthma, pulmonary fibrosis, neuropathy, h/o TIA, CKD  Following with Dr. Leanord Hawking last visit 01/30/2022 whom recommended continue Tylenol twice daily as needed, tramadol 50 mg one half to full tablet for breakthrough pain  History of lumbar surgery (2) , cervical fusion.  She is seeing Dr. Marry Guan in the past.  01/12/22 ESI Dr Sharlet Salina   QTc 447 07/21/21   HISTORY:  Past Medical History:  Diagnosis Date   Achalasia of esophagus    Allergic rhinitis    Allergy    multiple, mostly aspirin, levaquin and shellfish.   Anemia    Anxiety    Arthritis    Asthma    Bilateral leg edema    Cataract 2014   exraction with len implant   Chronic pain syndrome    Chronic pulmonary aspiration    Complication of anesthesia    Breathing problems upon waking up. 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   Compressed cervical disc    COPD (chronic obstructive pulmonary disease) (HCC)    CVA (cerebral infarction)    Dyspnea    Esophageal dysmotility    Gastritis    H/O food anaphylaxis    Heart murmur    as child   History  of hiatal hernia    Hypokalemia    IBS (irritable bowel syndrome)    Kidney lesion 2018   Left   Liver lesion    Migraine    PICC (peripherally inserted central catheter) removal 02/20/2017   PONV (postoperative nausea and vomiting)    Problems with swallowing    intermittently   Pulmonary fibrosis (HCC)    Renal cell carcinoma (HCC)    small- left kidney   Seizures ([place])    only in elementary school-was on medication and was tx up until 5th grade and has had no more seizures since then   Shingles    Stroke ([place])    slurred speech, drawn face, imaging normal, occurred twice, [facility]-"TIA" if antything" 02/20/17-no residual effects   Tracheal granulation    Tracheostomy in place [facility])    Vocal cord paresis    Past Surgical History:  Procedure Laterality Date   ABDOMINAL HYSTERECTOMY     APPLICATION OF A-CELL OF HEAD/NECK N/A 02/21/2017   Procedure: APPLICATION OF A-CELL OF HEAD/NECK;  Surgeon: Wallace Going, DO;  Location: [place];  Service: Plastics;  Laterality: N/A;   BACK SURGERY     BOTOX INJECTION N/A 07/29/2013   Procedure: BOTOX INJECTION;  Surgeon: Jerene Bears, MD;  Location[facility];  Service:  Gastroenterology;  Laterality: N/A;   BOTOX INJECTION N/A 05/04/2015   Procedure: BOTOX INJECTION;  Surgeon: Jerene Bears, MD;  [facility];  Service: Gastroenterology;  Laterality: N/A;   BOTOX INJECTION N/A 02/18/2018   Procedure: BOTOX INJECTION;  Surgeon: Jerene Bears, MD;  [facility];  Service: Gastroenterology;  Laterality: N/A;   BOTOX INJECTION N/A 06/30/2019   Procedure: BOTOX INJECTION;  Surgeon: Jerene Bears, MD;  [facility];  Service: Gastroenterology;  Laterality: N/A;   BOTOX INJECTION N/A 08/05/2020   Procedure: BOTOX INJECTION;  Surgeon: Jerene Bears, MD;  [facility];  Service: Gastroenterology;  Laterality: N/A;   BOTOX INJECTION N/A 06/16/2021   Procedure: BOTOX INJECTION;  Surgeon: Jerene Bears, MD;   [facility];  Service: Gastroenterology;  Laterality: N/A;   BREAST BIOPSY Left    neg   BREAST SURGERY Left 2002   bx of skin   CHOLECYSTECTOMY     COLONOSCOPY     DIAGNOSTIC LAPAROSCOPY     ESOPHAGEAL MANOMETRY N/A 12/16/2012   Procedure: ESOPHAGEAL MANOMETRY (EM);  Surgeon: Jerene Bears, MD;  [facility];  Service: Gastroenterology;  Laterality: N/A;   ESOPHAGOGASTRODUODENOSCOPY (EGD) WITH PROPOFOL N/A 07/29/2013   Procedure: ESOPHAGOGASTRODUODENOSCOPY (EGD) WITH PROPOFOL;  Surgeon: Jerene Bears, MD;  [facility];  Service: Gastroenterology;  Laterality: N/A;  with botox injection   ESOPHAGOGASTRODUODENOSCOPY (EGD) WITH PROPOFOL N/A 05/04/2015   Procedure: ESOPHAGOGASTRODUODENOSCOPY (EGD) WITH PROPOFOL;  Surgeon: Jerene Bears, MD;  [facility];  Service: Gastroenterology;  Laterality: N/A;   ESOPHAGOGASTRODUODENOSCOPY (EGD) WITH PROPOFOL N/A 02/18/2018   Procedure: ESOPHAGOGASTRODUODENOSCOPY (EGD) WITH PROPOFOL;  Surgeon: Jerene Bears, MD;  [facility];  Service: Gastroenterology;  Laterality: N/A;   ESOPHAGOGASTRODUODENOSCOPY (EGD) WITH PROPOFOL N/A 06/30/2019   Procedure: ESOPHAGOGASTRODUODENOSCOPY (EGD) WITH PROPOFOL;  Surgeon: Jerene Bears, MD;  [facility];  Service: Gastroenterology;  Laterality: N/A;   ESOPHAGOGASTRODUODENOSCOPY (EGD) WITH PROPOFOL N/A 08/05/2020   Procedure: ESOPHAGOGASTRODUODENOSCOPY (EGD) WITH PROPOFOL;  Surgeon: Jerene Bears, MD;  [facility];  Service: Gastroenterology;  Laterality: N/A;   ESOPHAGOGASTRODUODENOSCOPY (EGD) WITH PROPOFOL N/A 06/16/2021   Procedure: ESOPHAGOGASTRODUODENOSCOPY (EGD) WITH PROPOFOL;  Surgeon: Jerene Bears, MD;  [facility];  Service: Gastroenterology;  Laterality: N/A;   EYE SURGERY     Catarct surgery 2014   Eye Surgery AS Child Left    INCISION AND DRAINAGE OF WOUND N/A 02/21/2017   Procedure: IRRIGATION AND DEBRIDEMENT WOUND NECK;   Surgeon: Wallace Going, DO;  Location: [facility];  Service: Plastics;  Laterality: N/A;   JEJUNOSTOMY FEEDING TUBE     x2 both failed. no longer has   LUMBAR LAMINECTOMY/DECOMPRESSION MICRODISCECTOMY Bilateral 05/13/2018   Procedure: Laminectomy and Foraminotomy - Lumbar four-Lumbar five- bilateral;  Surgeon: Eustace Moore, MD;  Location: [facility];  Service: Neurosurgery;  Laterality: Bilateral;   LUMBAR LAMINECTOMY/DECOMPRESSION MICRODISCECTOMY Bilateral 11/08/2020   Procedure: Laminectomy and Foraminotomy - bilateral - Lumbar two-Lumbar three - Lumbar three-Lumbar four;  Surgeon: Eustace Moore, MD;  Location: [place];  Service: Neurosurgery;  Laterality: Bilateral;   MULTIPLE TOOTH EXTRACTIONS     2 teeth removed   OOPHORECTOMY     POSTERIOR CERVICAL FUSION/FORAMINOTOMY N/A 01/10/2017   Procedure: LAMINECTOMY AND FORAMINOTOMY CERVICAL FOUR-CERVICAL FIVE, CERVICAL FIVE-SIX POSTERIOR CERVICAL INSTRUMENT FUSION CERVICAL THREE-CERVICAL SEVEN,CERVICAL LAMINECTOMY CERVICAL THREE-CERVICAL SEVEN.;  Surgeon: Eustace Moore, MD;  Location: [facility];  Service: Neurosurgery;  Laterality: N/A;  posterior  TONSILLECTOMY     TRACHEOSTOMY  1996   done at [facility], Dr. Kathyrn Sheriff   TRACHEOSTOMY REVISION N/A 09/14/2021   Procedure: TRACHEOSTOMA REVISION, TRACHEOPLASTY;  Surgeon: Margaretha Sheffield, MD;  Location: [facility];  Service: ENT;  Laterality: N/A;   TUBAL LIGATION     VIDEO BRONCHOSCOPY Bilateral 11/20/2012   Procedure: VIDEO BRONCHOSCOPY WITH FLUORO;  Surgeon: Juanito Doom, MD;  [facility];  Service: Cardiopulmonary;  Laterality: Bilateral;   Family History  Problem Relation Age of Onset   Asthma Cousin    COPD Cousin    Breast cancer Maternal Grandmother 75   Asthma Father    Kidney cancer Father    Arrhythmia Father    Lung cancer Father    Lung cancer Paternal Uncle    COPD Paternal Grandfather    Alcohol abuse Mother    Breast cancer Maternal Aunt 54   Bladder Cancer Neg Hx    Colon  cancer Neg Hx    Esophageal cancer Neg Hx    Pancreatic cancer Neg Hx    Stomach cancer Neg Hx    Liver disease Neg Hx     Allergies: Asa [aspirin], Bee venom, Epinephrine, Feraheme [ferumoxytol], Influenza vaccines, Pfizer-biontech covid-19 vacc [covid-19 mrna vacc (moderna)], Quinolones, Shellfish allergy, Ciprofloxacin, Clarithromycin, Erythromycin, Levaquin [levofloxacin in d5w], Peanut-containing drug products, Septra [sulfamethoxazole-trimethoprim], Telithromycin, Zanaflex [tizanidine], and Adhesive [tape] Current Outpatient Medications on File Prior to Visit  Medication Sig Dispense Refill   acetaminophen (TYLENOL) 500 MG tablet Take 1,000 mg by mouth 2 (two) times daily as needed for moderate pain or headache.     albuterol (VENTOLIN HFA) 108 (90 Base) MCG/ACT inhaler Inhale 2 puffs into the lungs every 6 (six) hours as needed for wheezing or shortness of breath. 18 g 3   azelastine (ASTELIN) 0.1 % nasal spray Place 1 spray into both nostrils 2 (two) times daily. Use in each nostril as directed (Patient taking differently: Place 1 spray into both nostrils as needed for allergies. Use in each nostril as directed) 30 mL 6   Budeson-Glycopyrrol-Formoterol (BREZTRI AEROSPHERE) 160-9-4.8 MCG/ACT AERO Inhale 2 puffs into the lungs in the morning and at bedtime. 5.9 g 0   cetirizine (ZYRTEC) 10 MG tablet TAKE 1 TABLET BY MOUTH EVERY DAY (NOT COVERED 90 tablet 0   Cyanocobalamin (VITAMIN B-12 IJ) Inject 1 Dose as directed every 30 (thirty) days.     diphenhydrAMINE (BENADRYL) 25 MG tablet Take 50 mg by mouth daily as needed for allergies (Asthma).     EPINEPHrine 0.3 mg/0.3 mL IJ SOAJ injection Inject 0.3 mg into the muscle as needed for anaphylaxis.      estradiol (ESTRACE) 1 MG tablet TAKE 1 TABLET BY MOUTH EVERY DAY 90 tablet 1   Fluticasone-Umeclidin-Vilant (TRELEGY ELLIPTA) 100-62.5-25 MCG/ACT AEPB Inhale 1 puff into the lungs daily. 14 each 0   furosemide (LASIX) 20 MG tablet TAKE 1 TABLET  BY MOUTH EVERY DAY AS NEEDED (Patient taking differently: Take 20 mg by mouth every morning. TAKE 1 TABLET BY MOUTH EVERY DAY AS NEEDED) 90 tablet 1   guaiFENesin-codeine 100-10 MG/5ML syrup Take 5 mLs by mouth 2 (two) times daily as needed for cough. 120 mL 0   ibuprofen (ADVIL,MOTRIN) 200 MG tablet Take 400-800 mg by mouth daily as needed for headache or moderate pain.     ipratropium-albuterol (DUONEB) 0.5-2.5 (3) MG/3ML SOLN Take 3 mLs by nebulization every 4 (four) hours as needed. Dx 496 120 mL 2   methocarbamol (ROBAXIN) 500 MG  tablet Take 500 mg by mouth 4 (four) times daily.     montelukast (SINGULAIR) 10 MG tablet TAKE 1 TABLET BY MOUTH EVERYDAY AT BEDTIME 90 tablet 1   Naphazoline-Pheniramine 0.027-0.315 % SOLN Place 1 drop into both eyes daily as needed (allergies).      oxyCODONE (OXY IR/ROXICODONE) 5 MG immediate release tablet Take 5 mg by mouth 2 (two) times daily as needed.     pantoprazole (PROTONIX) 40 MG tablet TAKE ONE TABLET EVERY DAY NEEDS FOR OFFICE USE VISIT FOR MORE REFILLS. 90 tablet 2   Pseudoephedrine HCl (SUDAFED PO) Take 1 tablet by mouth daily as needed (cold symptons).     rizatriptan (MAXALT-MLT) 10 MG disintegrating tablet TAKE 1 TABLET (10 MG TOTAL) BY MOUTH DAILY AS NEEDED FOR MIGRAINE. MAY REPEAT FOR ONE DOSE 2 HOURS AFTER THE FIRST ONE IF NEEDED. 10 tablet 0   sodium chloride (OCEAN) 0.65 % SOLN nasal spray Place 1 spray into both nostrils every 4 (four) hours as needed for congestion.      White Petrolatum-Mineral Oil (LUBRICANT EYE) OINT Place 1 application into both eyes 2 (two) times daily as needed (for dry eyes).      [DISCONTINUED] zolmitriptan (ZOMIG-ZMT) 5 MG disintegrating tablet Take 1 tablet (5 mg total) by mouth daily as needed for migraine. 10 tablet 0   No current facility-administered medications on file prior to visit.    Social History   Tobacco Use   Smoking status: Never   Smokeless tobacco: Never  Vaping Use   Vaping Use: Never used   Substance Use Topics   Alcohol use: Yes    Comment: occ wine   Drug use: No    Review of Systems  Constitutional:  Negative for chills and fever.  Respiratory:  Negative for cough.   Cardiovascular:  Negative for chest pain and palpitations.  Gastrointestinal:  Negative for nausea and vomiting.  Musculoskeletal:  Positive for arthralgias and back pain.      Objective:    BP 118/70 (BP Location: Left Arm, Patient Position: Sitting, Cuff Size: Normal)   Pulse 68   Temp 97.8 F (36.6 C) (Oral)   Wt 100 lb 6.4 oz (45.5 kg)   SpO2 98%   BMI 17.79 kg/m    Physical Exam Vitals reviewed.  Constitutional:      Appearance: She is well-developed.  Eyes:     Conjunctiva/sclera: Conjunctivae normal.  Cardiovascular:     Rate and Rhythm: Normal rate and regular rhythm.     Pulses: Normal pulses.     Heart sounds: Normal heart sounds.  Pulmonary:     Effort: Pulmonary effort is normal.     Breath sounds: Normal breath sounds. No wheezing, rhonchi or rales.  Musculoskeletal:     Right wrist: No swelling, deformity or tenderness. Normal range of motion.     Left wrist: No swelling or tenderness. Normal range of motion.     Right hand: No swelling or bony tenderness.     Left hand: No swelling or bony tenderness.     Comments: Grip strength normal. Palpable radial pulses and sensation intact.  Bouchard's nodes present bilaterally.  No gross enlargement of MCP joints   Skin:    General: Skin is warm and dry.  Neurological:     Mental Status: She is alert.  Psychiatric:        Speech: Speech normal.        Behavior: Behavior normal.        Thought  Content: Thought content normal.        Assessment & Plan:   Problem List Items Addressed This Visit       Other   Arthralgia - Primary    Previously negative RA, ANA 9 years ago, 08/13/12. I reviewed rheumatology consult 07/2014 Dr Jefm Bryant who suspected lumbar disc disease.  However particular attention to hands/wrist not  addressed in note. Advised x-ray of bilateral hands and wrist as well as autoimmune markers today.  Patient very politely declines.  Presentation is more consistent with osteoarthritis.  She is more inclined to trial Cymbalta 30 mg . I think very appropriate in particular with longstanding chronic low back pain.  I think this is very appropriate.  Counseled her on how to use Tylenol arthritis and to consider scheduling this medication by taking it daily.  She takes tramadol 25 mg to 50 mg as needed as prescribed by Dr Sharlet Salina .   Close follow-up in 6 weeks.      Relevant Medications   DULoxetine (CYMBALTA) 30 MG capsule      I am having Anne [name] start on DULoxetine. I am also having her maintain her sodium chloride, acetaminophen, ipratropium-albuterol, Naphazoline-Pheniramine, Lubricant Eye, ibuprofen, diphenhydrAMINE, rizatriptan, EPINEPHrine, azelastine, guaiFENesin-codeine, Pseudoephedrine HCl (SUDAFED PO), furosemide, oxyCODONE, Cyanocobalamin (VITAMIN B-12 IJ), pantoprazole, albuterol, montelukast, estradiol, methocarbamol, Breztri Aerosphere, cetirizine, and Trelegy Ellipta.   Meds ordered this encounter  Medications   DULoxetine (CYMBALTA) 30 MG capsule    Sig: Take 1 capsule (30 mg total) by mouth daily.    Dispense:  30 capsule    Refill:  1    Order Specific Question:   Supervising Provider    Answer:   Crecencio Mc [[id number]]    Return precautions given.   Risks, benefits, and alternatives of the medications and treatment plan prescribed today were discussed, and patient expressed understanding.   Education regarding symptom management and diagnosis given to patient on AVS.  Continue to follow with [name], MD for routine health maintenance.   Anne [name] and I agreed with plan.   Mable Paris, FNP

## 2022-02-10 NOTE — Assessment & Plan Note (Addendum)
Previously negative RA, ANA 9 years ago, 08/13/12. I reviewed rheumatology consult 07/2014 Dr Jefm Bryant who suspected lumbar disc disease.  However particular attention to hands/wrist not addressed in note. Advised x-ray of bilateral hands and wrist as well as autoimmune markers today.  Patient very politely declines.  Presentation is more consistent with osteoarthritis.  She is more inclined to trial Cymbalta 30 mg . I think very appropriate in particular with longstanding chronic low back pain.  I think this is very appropriate.  Counseled her on how to use Tylenol arthritis and to consider scheduling this medication by taking it daily.  She takes tramadol 25 mg to 50 mg as needed as prescribed by Dr Sharlet Salina .   Close follow-up in 6 weeks.

## 2022-02-13 ENCOUNTER — Telehealth: Payer: Self-pay | Admitting: Pulmonary Disease

## 2022-02-13 NOTE — Telephone Encounter (Signed)
I called patient about do PFT today and she stated that because she has a trach or had a trach when doing a breathing test about the 2nd time the flap covers up the hole and she can't do the test. Dr. Patsey Berthold what do you think should she try or not

## 2022-02-13 NOTE — Telephone Encounter (Signed)
I would recommend she try that test.  It is important to try to establish what her lung function is.

## 2022-02-13 NOTE — Telephone Encounter (Signed)
Lm x1 for patient.  

## 2022-02-14 NOTE — Telephone Encounter (Signed)
Spoke to patient and relayed below message. She is concerned about the muscle in her thoart collapsing while her nose is clipped during PFT. She stated that this has happened previously.    Dr. Patsey Berthold, please advise. Thanks

## 2022-02-14 NOTE — Telephone Encounter (Signed)
Noted  

## 2022-02-14 NOTE — Telephone Encounter (Signed)
I have spoke with Anne [name] and her PFT has been scheduled on 02/28/22 @ 9:00am instructions mailed to her

## 2022-02-14 NOTE — Telephone Encounter (Signed)
Spoke to patient. She would like to proceed with PFT.  Rodena Piety, please schedule PFT. Thanks

## 2022-02-17 ENCOUNTER — Other Ambulatory Visit: Payer: Self-pay

## 2022-02-17 ENCOUNTER — Telehealth: Payer: Self-pay | Admitting: Pulmonary Disease

## 2022-02-17 MED ORDER — TRELEGY ELLIPTA 100-62.5-25 MCG/ACT IN AEPB: 1.0000 | INHALATION_SPRAY | Freq: Every day | RESPIRATORY_TRACT | 0 refills | Status: AC

## 2022-02-17 NOTE — Telephone Encounter (Signed)
I have sent in a  prescription for the Trelegy to her pharmacy. I notified the patient. Nothing further is needed.

## 2022-02-23 ENCOUNTER — Other Ambulatory Visit: Payer: Self-pay

## 2022-02-23 ENCOUNTER — Telehealth: Payer: Self-pay | Admitting: Physician Assistant

## 2022-02-23 MED ORDER — ONDANSETRON 4 MG PO TBDP: 4.0000 mg | ORAL_TABLET | Freq: Four times a day (QID) | ORAL | 1 refills | Status: DC | PRN

## 2022-02-23 NOTE — Telephone Encounter (Signed)
Zofran odt  4 mg every 6 hours PRN nausea

## 2022-02-23 NOTE — Telephone Encounter (Signed)
Script sent to pharmacy. Pt notified via mychart.

## 2022-02-23 NOTE — Telephone Encounter (Signed)
Patient is wondering if there is anything that she can do or anything we can give her to help with her nausea before her appointment. Please advise

## 2022-02-23 NOTE — Telephone Encounter (Signed)
Please see note below and advise  

## 2022-02-28 ENCOUNTER — Encounter: Payer: Self-pay | Admitting: Family Medicine

## 2022-02-28 ENCOUNTER — Encounter: Payer: Self-pay | Admitting: Pulmonary Disease

## 2022-02-28 ENCOUNTER — Ambulatory Visit (INDEPENDENT_AMBULATORY_CARE_PROVIDER_SITE_OTHER): Payer: Medicare Other | Admitting: Pulmonary Disease

## 2022-02-28 ENCOUNTER — Ambulatory Visit: Payer: Medicare Other | Attending: Pulmonary Disease

## 2022-02-28 VITALS — BP 118/78 | HR 70 | Temp 97.4°F | Ht 63.0 in | Wt 100.6 lb

## 2022-02-28 DIAGNOSIS — J84112 Idiopathic pulmonary fibrosis: Secondary | ICD-10-CM

## 2022-02-28 DIAGNOSIS — J449 Chronic obstructive pulmonary disease, unspecified: Secondary | ICD-10-CM | POA: Insufficient documentation

## 2022-02-28 DIAGNOSIS — J398 Other specified diseases of upper respiratory tract: Secondary | ICD-10-CM

## 2022-02-28 DIAGNOSIS — E8801 Alpha-1-antitrypsin deficiency: Secondary | ICD-10-CM

## 2022-02-28 DIAGNOSIS — R0602 Shortness of breath: Secondary | ICD-10-CM | POA: Diagnosis present

## 2022-02-28 DIAGNOSIS — I5189 Other ill-defined heart diseases: Secondary | ICD-10-CM

## 2022-02-28 DIAGNOSIS — J84111 Idiopathic interstitial pneumonia, not otherwise specified: Secondary | ICD-10-CM

## 2022-02-28 DIAGNOSIS — J4540 Moderate persistent asthma, uncomplicated: Secondary | ICD-10-CM

## 2022-02-28 LAB — PULMONARY FUNCTION TEST ARMC ONLY
FEF 25-75 Pre: 0.72 L/sec
FEF2575-%Pred-Pre: 41 %
FEV1-%Pred-Pre: 40 %
FEV1-Pre: 0.85 L
FEV1FVC-%Pred-Pre: 85 %
FEV6-%Pred-Pre: 49 %
FEV6-Pre: 1.32 L
FEV6FVC-%Pred-Pre: 104 %
FVC-%Pred-Pre: 47 %
FVC-Pre: 1.32 L
Pre FEV1/FVC ratio: 65 %
Pre FEV6/FVC Ratio: 100 %

## 2022-02-28 MED ORDER — TRELEGY ELLIPTA 100-62.5-25 MCG/ACT IN AEPB: 1.0000 | INHALATION_SPRAY | Freq: Every day | RESPIRATORY_TRACT | 0 refills | Status: DC

## 2022-02-28 NOTE — Progress Notes (Signed)
Subjective:    Patient ID: Anne [name], female    DOB: 03/16/1951, 71 y.o.   MRN: [id number] Patient Care Team: Glori Luis, MD as PCP - General (Family Medicine) Iran Ouch, MD as PCP - Cardiology (Cardiology) Hildred Laser, MD (Inactive) as Consulting Physician (Urology) Pyrtle, Carie Caddy, MD as Consulting Physician (Gastroenterology) Salena Saner, MD as Consulting Physician (Pulmonary Disease)  Chief Complaint  Patient presents with   Follow-up    SOB with exertion. A little wheezing. Dry cough off and on.    HPI Anne [name] is a 71 year old lifelong never smoker with a history of moderate persistent asthma and idiopathic pleural-parenchymal fibroelastosis who presents for follow-up.  She was last seen here on 13 January 2022.  At that time she was given a trial of Trelegy Ellipta 100 and notices that this helps her dyspnea and wheezing.  She has had difficulties however getting it filled due to cost.  There are no other alternatives that the patient can tolerate.  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  She had closure of the stoma performed by Dr. Elenore Rota on 21 of June 2023, this was successful, she has not had any sequelae.  She is known to have some granulation tissue residual after tracheostomy decannulation.  He continues to feel better overall since her tracheostomy has been removed.  Trelegy has helped with the shortness of breath triggered by environmental changes. He has been tried on multiple controller medications but seems to have a side effect to all of them.  Trelegy has been the most effective.    She is an alpha MZ phenotype.  Last alpha level was 105 in 71 August 2023.  Levels have been  consistent.  DATA: HRCT chest 03/01/16: Extensive patchy subpleural reticulation, traction bronchiectasis, volume loss, distortion and pleural thickening relatively symmetrically and almost exclusively involving the upper lobes with associated superior hilar retraction. No frank honeycombing. Findings have progressed since  2014. 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 Chest x-ray 05/14/2020: Severe pleural-parenchymal scarring unchanged from prior tracheostomy tube in good position. Alpha-1 antitrypsin 06/14/2020: Normal level at 105 (she has known PI MZ). 2D echo 07/07/2020: LVEF 55 to 60%.  No wall motion abnormalities, grade II DD, aortic regurgitation mild to moderate. Coronary CT 08/12/2020: Coronary calcium score 0. HRCT 04/27/2021: Idiopathic pleural-parenchymal fibroelastosis redemonstrated and without change.  Small left-sided loculated pleural effusion predominantly mid to upper left hemithorax.  Review of Systems A 10 point review of systems was performed and it is as noted above otherwise negative.  Patient Active Problem List   Diagnosis Date Noted   Arthralgia 02/10/2022   Neck pain 11/07/2021   Arthritis 09/06/2021   Stress 03/11/2021   Neuropathy 08/17/2020   Scalp lesion 08/17/2020   History of anaphylaxis 04/18/2019   Liver lesion 10/11/2018   S/P lumbar laminectomy 05/13/2018   Low back pain 02/07/2018   Iron deficiency anemia 12/11/2017   Mild persistent asthma 10/10/2017   Anxiety 08/03/2017   Current long-term use of postmenopausal hormone replacement therapy 03/19/2017   Kidney lesion 12/13/2016   Migraine 09/12/2016   Esophageal dysphagia 06/19/2016   Skin nodule 01/10/2016   Degenerative arthritis of cervical spine 10/28/2015   Gastritis  B12 deficiency 03/15/2015   Slow transit constipation 09/03/2014   Menopausal symptom 09/03/2014   History of TIA (transient ischemic attack) 06/03/2014   Chronic  pain syndrome 04/28/2014   Hypertensive lower esophageal sphincter 07/29/2013   Achalasia of esophagus 07/29/2013   Insomnia 12/13/2012   HLD (hyperlipidemia) 12/13/2012   Pulmonary fibrosis (HCC) 08/05/2012   Social History   Tobacco Use   Smoking status: Never   Smokeless tobacco: Never  Substance Use Topics   Alcohol use: Yes    Comment: occ wine   Allergies  Allergen Reactions   Asa [Aspirin] Shortness Of Breath, Swelling and Other (See Comments)    Tongue swells   Bee Venom Anaphylaxis and Shortness Of Breath   Epinephrine Shortness Of Breath and Palpitations    Occurs when pt is given double the dose, pt would use epipen as needed    Feraheme [Ferumoxytol] Shortness Of Breath    Light headed    Influenza Vaccines Shortness Of Breath and Other (See Comments)    Can take flu vaccines without eggs    Pfizer-Biontech Covid-19 Vacc [Covid-19 Mrna Vacc (Moderna)] Anaphylaxis   Quinolones Swelling and Other (See Comments)    Feet swell   Shellfish Allergy Anaphylaxis   Celebrex [Celecoxib] Nausea And Vomiting    As well as headache   Ciprofloxacin Swelling and Other (See Comments)    ALL MEDS ENDING IN -FLOXACIN MAKE THE FEET SWELL   Clarithromycin Nausea Only    Abdomina pain   Erythromycin Nausea Only    Abdominal pain   Levaquin [Levofloxacin In D5w] Swelling and Other (See Comments)    Feet and legs ache and SWELL   Peanut-Containing Drug Products Other (See Comments)    Wheezing (boiled or raw peanuts)   Pineapple Swelling    Mouth swells and Patient gets nauseated   Septra [Sulfamethoxazole-Trimethoprim] Diarrhea   Telithromycin Nausea Only    Abdominal pain   Zanaflex [Tizanidine] Other (See Comments)    Caused the patient to feel "spaced out" and "not well"   Adhesive [Tape] Rash and Other (See Comments)    Paper works tape works fine    Current Meds  Medication Sig   acetaminophen (TYLENOL) 500 MG tablet Take 1,000 mg by mouth 2 (two) times daily as  needed for moderate pain or headache.   albuterol (VENTOLIN HFA) 108 (90 Base) MCG/ACT inhaler Inhale 2 puffs into the lungs every 6 (six) hours as needed for wheezing or shortness of breath.   azelastine (ASTELIN) 0.1 % nasal spray Place 1 spray into both nostrils 2 (two) times daily. Use in each nostril as directed (Patient taking differently: Place 1 spray into both nostrils as needed for allergies. Use in each nostril as directed)   cetirizine (ZYRTEC) 10 MG tablet TAKE 1 TABLET BY MOUTH EVERY DAY (NOT COVERED   Cyanocobalamin (VITAMIN B-12 IJ) Inject 1 Dose as directed every 30 (thirty) days.   diphenhydrAMINE (BENADRYL) 25 MG tablet Take 50 mg by mouth daily as needed for allergies (Asthma).   DULoxetine (CYMBALTA) 30 MG capsule Take 1 capsule (30 mg total) by mouth daily.   EPINEPHrine 0.3 mg/0.3 mL IJ SOAJ injection Inject 0.3 mg into the muscle as needed for anaphylaxis.    estradiol (ESTRACE) 1 MG tablet TAKE 1 TABLET BY MOUTH EVERY DAY   Fluticasone-Umeclidin-Vilant (TRELEGY ELLIPTA) 100-62.5-25 MCG/ACT AEPB Inhale 1 puff into the lungs daily.   Fluticasone-Umeclidin-Vilant (TRELEGY ELLIPTA) 100-62.5-25 MCG/ACT AEPB Inhale 1 puff into the lungs daily.  furosemide (LASIX) 20 MG tablet TAKE 1 TABLET BY MOUTH EVERY DAY AS NEEDED (Patient taking differently: Take 20 mg by mouth every morning. TAKE 1 TABLET BY MOUTH EVERY DAY AS NEEDED)   guaiFENesin-codeine 100-10 MG/5ML syrup Take 5 mLs by mouth 2 (two) times daily as needed for cough.   ibuprofen (ADVIL,MOTRIN) 200 MG tablet Take 400-800 mg by mouth daily as needed for headache or moderate pain.   ipratropium-albuterol (DUONEB) 0.5-2.5 (3) MG/3ML SOLN Take 3 mLs by nebulization every 4 (four) hours as needed. Dx 496   methocarbamol (ROBAXIN) 500 MG tablet Take 500 mg by mouth 4 (four) times daily.   montelukast (SINGULAIR) 10 MG tablet TAKE 1 TABLET BY MOUTH EVERYDAY AT BEDTIME   Naphazoline-Pheniramine 0.027-0.315 % SOLN Place 1 drop into  both eyes daily as needed (allergies).    ondansetron (ZOFRAN-ODT) 4 MG disintegrating tablet Take 1 tablet (4 mg total) by mouth every 6 (six) hours as needed for nausea or vomiting.   pantoprazole (PROTONIX) 40 MG tablet TAKE ONE TABLET EVERY DAY NEEDS FOR OFFICE USE VISIT FOR MORE REFILLS.   Pseudoephedrine HCl (SUDAFED PO) Take 1 tablet by mouth daily as needed (cold symptons).   rizatriptan (MAXALT-MLT) 10 MG disintegrating tablet TAKE 1 TABLET (10 MG TOTAL) BY MOUTH DAILY AS NEEDED FOR MIGRAINE. MAY REPEAT FOR ONE DOSE 2 HOURS AFTER THE FIRST ONE IF NEEDED.   sodium chloride (OCEAN) 0.65 % SOLN nasal spray Place 1 spray into both nostrils every 4 (four) hours as needed for congestion.    traMADol (ULTRAM) 50 MG tablet 0.5-1 Tablet(s) By Mouth Twice Daily PRN   White Petrolatum-Mineral Oil (LUBRICANT EYE) OINT Place 1 application into both eyes 2 (two) times daily as needed (for dry eyes).    Immunization History  Administered Date(s) Administered   Fluad Quad(high Dose 65+) 02/14/2021   Influenza Inj Mdck Quad Pf 01/15/2019, 01/16/2020, 12/15/2021   Influenza, Quadrivalent, Recombinant, Inj, Pf 02/06/2017, 12/19/2017   Influenza,inj,Quad PF,6+ Mos 12/19/2012   Influenza,trivalent, recombinat, inj, PF 01/05/2014   Janssen (J&J) SARS-COV-2 Vaccination 01/05/2020, 03/10/2020   PFIZER(Purple Top)SARS-COV-2 Vaccination 05/09/2019   Pneumococcal Conjugate-13 01/23/2014   Pneumococcal Polysaccharide-23 03/11/2021   Td 11/07/2021       Objective:   Physical Exam BP 118/78 (BP Location: Left Arm, Cuff Size: Normal)   Pulse 70   Temp (!) 97.4 F (36.3 C)   Ht 5\' 3"  (1.6 m)   Wt 100 lb 9.6 oz (45.6 kg)   SpO2 99%   BMI 17.82 kg/m   SpO2: 99 % O2 Device: None (Room air)  GENERAL: Well-developed, chronically ill-appearing, thin but not cachectic woman, in no acute distress.  She is fully ambulatory. HEAD: Normocephalic, atraumatic. EYES: Pupils equal, round, reactive to light.  No  scleral icterus.   MOUTH: Oral mucosa moist, no thrush NECK: Supple.Trachea midline. No JVD.  No adenopathy.  Tracheostomy stoma closed, well-healed. PULMONARY: Good air entry bilaterally.    Coarse breath sounds, end expiratory wheezes noted particularly in the upper lung zones, no other adventitious sounds.   CARDIOVASCULAR: S1 and S2. Regular rate and rhythm.  No rubs, murmurs or gallops heard. ABDOMEN: Benign. MUSCULOSKELETAL: No joint deformity, no clubbing, no edema. NEUROLOGIC: No focal deficit noted.  No gait disturbance.  Phonating well. SKIN: Limited exam shows no rashes. PSYCH: Mood and behavior normal.   PFTs were attempted today at cardiopulmonary.  The patient was unable to perform most maneuvers.  Simple spirometry however it does show moderate to severe  obstruction.  Flow volume loop consistent with possible subglottic stenosis to be expected after decannulation from longstanding tracheostomy.  Findings were discussed with the patient.    Assessment & Plan:     ICD-10-CM   1. Moderate persistent allergic asthma  J45.40    Continue Trelegy 100 Continue as needed albuterol    2. Shortness of breath  R06.02    Improved on Trelegy    3. Grade II diastolic dysfunction  I51.89    This issue adds complexity to her management    4. Alpha-1-antitrypsin deficiency (HCC) - MZ phenotype  E88.01    No need for supplementation Alpha level 105 mg/dL    5. Idiopathic pleuroparenchymal fibroelastosis (HCC)  J84.112    Continue to follow expectantly    6. Tracheal granulation  J39.8    Continue follow-up with ENT     Meds ordered this encounter  Medications   Fluticasone-Umeclidin-Vilant (TRELEGY ELLIPTA) 100-62.5-25 MCG/ACT AEPB    Sig: Inhale 1 puff into the lungs daily.    Dispense:  28 each    Refill:  0    Order Specific Question:   Lot Number?    Answer:   [id number]    Order Specific Question:   Expiration Date?    Answer:   06/26/2023    Order Specific Question:    Quantity    Answer:   2   Will see the patient in follow-up in 2 to 3 months time she is to call sooner should any new problems arise.  Gailen Shelter, MD Advanced Bronchoscopy [facility]-    *This note was dictated using voice recognition software/Dragon.  Despite best efforts to proofread, errors can occur which can change the meaning. Any transcriptional errors that result from this process are unintentional and may not be fully corrected at the time of dictation.

## 2022-02-28 NOTE — Patient Instructions (Signed)
We will continue the Trelegy for now.  We have provided you with extra samples.  Check with your insurance plan to see what covered alternatives to the Trelegy they have.  Unfortunately you have tried pretty much everything else and the Trelegy has been the most effective.  I recommend that you get the RSV vaccine.  You can get this either at [facility] or [facility].  We will see you in follow-up in 2 to 3 months time call sooner should any new problems arise.

## 2022-03-04 ENCOUNTER — Other Ambulatory Visit: Payer: Self-pay | Admitting: Family

## 2022-03-04 DIAGNOSIS — M2550 Pain in unspecified joint: Secondary | ICD-10-CM

## 2022-03-05 ENCOUNTER — Other Ambulatory Visit: Payer: Self-pay | Admitting: Family Medicine

## 2022-03-08 ENCOUNTER — Ambulatory Visit: Payer: Medicare Other | Attending: Pulmonary Disease

## 2022-03-08 ENCOUNTER — Ambulatory Visit: Payer: Medicare Other | Admitting: Family Medicine

## 2022-03-08 DIAGNOSIS — R5383 Other fatigue: Secondary | ICD-10-CM | POA: Diagnosis not present

## 2022-03-08 DIAGNOSIS — I351 Nonrheumatic aortic (valve) insufficiency: Secondary | ICD-10-CM | POA: Insufficient documentation

## 2022-03-08 DIAGNOSIS — E785 Hyperlipidemia, unspecified: Secondary | ICD-10-CM | POA: Diagnosis not present

## 2022-03-08 DIAGNOSIS — R609 Edema, unspecified: Secondary | ICD-10-CM | POA: Insufficient documentation

## 2022-03-08 DIAGNOSIS — J449 Chronic obstructive pulmonary disease, unspecified: Secondary | ICD-10-CM | POA: Diagnosis not present

## 2022-03-08 DIAGNOSIS — I5030 Unspecified diastolic (congestive) heart failure: Secondary | ICD-10-CM | POA: Insufficient documentation

## 2022-03-08 DIAGNOSIS — G459 Transient cerebral ischemic attack, unspecified: Secondary | ICD-10-CM | POA: Insufficient documentation

## 2022-03-08 DIAGNOSIS — R0602 Shortness of breath: Secondary | ICD-10-CM | POA: Diagnosis present

## 2022-03-08 DIAGNOSIS — J8410 Pulmonary fibrosis, unspecified: Secondary | ICD-10-CM | POA: Diagnosis not present

## 2022-03-08 LAB — ECHOCARDIOGRAM COMPLETE
AR max vel: 2.32 cm2
AV Area VTI: 2.15 cm2
AV Area mean vel: 2.17 cm2
AV Mean grad: 1.5 mmHg
AV Peak grad: 2.9 mmHg
Ao pk vel: 0.86 m/s
Area-P 1/2: 4.41 cm2
Calc EF: 57.9 %
P 1/2 time: 450 msec
S' Lateral: 2.2 cm
Single Plane A2C EF: 56 %
Single Plane A4C EF: 56.5 %

## 2022-03-14 ENCOUNTER — Telehealth: Payer: Self-pay | Admitting: Pulmonary Disease

## 2022-03-14 MED ORDER — TRELEGY ELLIPTA 100-62.5-25 MCG/ACT IN AEPB: 1.0000 | INHALATION_SPRAY | Freq: Every day | RESPIRATORY_TRACT | 0 refills | Status: DC

## 2022-03-14 NOTE — Telephone Encounter (Signed)
She will need to go to urgent care to be tested for COVID, RSV and flu.  All of these are going around now.  The Tussionex equivalent over-the-counter is Delsym.  I would recommend she give that a try.  We can give her samples of the Trelegy and an application for [employer] assistance program.  Generally ipratropium alone is not going to be what she needs she is since she does have underlying asthma.

## 2022-03-14 NOTE — Telephone Encounter (Signed)
Would like a Prescription called in for stronger cough medicine. Has very bad cough. Also has question on medicine that was very expensive. She did call her ins. Co and has info on that to discuss. TY She is @ [phone]

## 2022-03-14 NOTE — Telephone Encounter (Signed)
I spoke with the patient. Cough for 3-4 days. Coughs up clear sputum. She is currently taking Dimetapp OTC.  I asked about a Covid test and she said she does not have that.  No fevers or chills.  Using her Trelegy and it does help. Wants to know if you can give her Tussinex? She has been given a prescription for this in the past but it has expired.  Also, her insurance will not pay for the Trelegy. They told her Ipratriopium is what they will pay for.

## 2022-03-14 NOTE — Telephone Encounter (Signed)
I have notified the patient. I placed the samples and the forms at the front desk for her to pick up. She will return them to our office once they are complete. Nothing further is needed.

## 2022-03-16 ENCOUNTER — Encounter: Payer: Self-pay | Admitting: Family Medicine

## 2022-03-16 ENCOUNTER — Ambulatory Visit (INDEPENDENT_AMBULATORY_CARE_PROVIDER_SITE_OTHER): Payer: Medicare Other | Admitting: Family Medicine

## 2022-03-16 VITALS — BP 115/70 | HR 76 | Temp 97.5°F | Ht 63.0 in | Wt 100.0 lb

## 2022-03-16 DIAGNOSIS — E538 Deficiency of other specified B group vitamins: Secondary | ICD-10-CM | POA: Diagnosis not present

## 2022-03-16 DIAGNOSIS — M1990 Unspecified osteoarthritis, unspecified site: Secondary | ICD-10-CM | POA: Diagnosis not present

## 2022-03-16 DIAGNOSIS — J4530 Mild persistent asthma, uncomplicated: Secondary | ICD-10-CM | POA: Diagnosis not present

## 2022-03-16 MED ORDER — PREDNISONE 20 MG PO TABS: 40.0000 mg | ORAL_TABLET | Freq: Every day | ORAL | 0 refills | Status: DC

## 2022-03-16 MED ORDER — CYANOCOBALAMIN 1000 MCG/ML IJ SOLN
1000.0000 ug | Freq: Once | INTRAMUSCULAR | Status: AC
  Administered 2022-03-16: 1000 ug via INTRAMUSCULAR

## 2022-03-16 NOTE — Assessment & Plan Note (Signed)
Chronic issue.  She can continue Tylenol over-the-counter and occasional use of tramadol and NSAIDs to help control her pain.

## 2022-03-16 NOTE — Progress Notes (Signed)
Tommi Rumps, MD Phone: [phone]  Anne [name] is a 71 y.o. female who presents today for f/u.   Asthma: She notes recent asthma exacerbation starting 1 to 2 days ago.  She has tightness and wheezing in her chest.  She does note some shortness of breath.  She notes minimal cough.  She notes this is typical for her this time of year with the cold weather.  She is on albuterol and Trelegy which are beneficial.  She did take a home COVID test that was negative.  She tried Delsym for her cough and this was not beneficial.  Back pain/arthralgias: Patient tried the Cymbalta that was prescribed by Mable Paris though she had vomiting within an hour and a half after taking it so she has not taken any more of it.  She is taking Tylenol for her pain.  She rarely takes the tramadol.  She only occasionally takes NSAIDs.  Social History   Tobacco Use  Smoking Status Never  Smokeless Tobacco Never    Current Outpatient Medications on File Prior to Visit  Medication Sig Dispense Refill   acetaminophen (TYLENOL) 500 MG tablet Take 1,000 mg by mouth 2 (two) times daily as needed for moderate pain or headache.     albuterol (VENTOLIN HFA) 108 (90 Base) MCG/ACT inhaler Inhale 2 puffs into the lungs every 6 (six) hours as needed for wheezing or shortness of breath. 18 g 3   azelastine (ASTELIN) 0.1 % nasal spray Place 1 spray into both nostrils 2 (two) times daily. Use in each nostril as directed (Patient taking differently: Place 1 spray into both nostrils as needed for allergies. Use in each nostril as directed) 30 mL 6   cetirizine (ZYRTEC) 10 MG tablet TAKE 1 TABLET BY MOUTH EVERY DAY (NOT COVERED 90 tablet 0   Cyanocobalamin (VITAMIN B-12 IJ) Inject 1 Dose as directed every 30 (thirty) days.     diphenhydrAMINE (BENADRYL) 25 MG tablet Take 50 mg by mouth daily as needed for allergies (Asthma).     DULoxetine (CYMBALTA) 30 MG capsule Take 1 capsule (30 mg total) by mouth daily. 30 capsule 1    EPINEPHrine 0.3 mg/0.3 mL IJ SOAJ injection Inject 0.3 mg into the muscle as needed for anaphylaxis.      estradiol (ESTRACE) 1 MG tablet TAKE 1 TABLET BY MOUTH EVERY DAY 90 tablet 1   Fluticasone-Umeclidin-Vilant (TRELEGY ELLIPTA) 100-62.5-25 MCG/ACT AEPB Inhale 1 puff into the lungs daily. 60 each 0   Fluticasone-Umeclidin-Vilant (TRELEGY ELLIPTA) 100-62.5-25 MCG/ACT AEPB Inhale 1 puff into the lungs daily. 28 each 0   Fluticasone-Umeclidin-Vilant (TRELEGY ELLIPTA) 100-62.5-25 MCG/ACT AEPB Inhale 1 puff into the lungs daily. 42 each 0   furosemide (LASIX) 20 MG tablet TAKE 1 TABLET BY MOUTH EVERY DAY AS NEEDED 90 tablet 1   guaiFENesin-codeine 100-10 MG/5ML syrup Take 5 mLs by mouth 2 (two) times daily as needed for cough. 120 mL 0   ibuprofen (ADVIL,MOTRIN) 200 MG tablet Take 400-800 mg by mouth daily as needed for headache or moderate pain.     ipratropium-albuterol (DUONEB) 0.5-2.5 (3) MG/3ML SOLN Take 3 mLs by nebulization every 4 (four) hours as needed. Dx 496 120 mL 2   methocarbamol (ROBAXIN) 500 MG tablet Take 500 mg by mouth 4 (four) times daily.     montelukast (SINGULAIR) 10 MG tablet TAKE 1 TABLET BY MOUTH EVERYDAY AT BEDTIME 90 tablet 1   Naphazoline-Pheniramine 0.027-0.315 % SOLN Place 1 drop into both eyes daily as needed (allergies).  ondansetron (ZOFRAN-ODT) 4 MG disintegrating tablet Take 1 tablet (4 mg total) by mouth every 6 (six) hours as needed for nausea or vomiting. 30 tablet 1   oxyCODONE (OXY IR/ROXICODONE) 5 MG immediate release tablet Take 5 mg by mouth 2 (two) times daily as needed.     pantoprazole (PROTONIX) 40 MG tablet TAKE ONE TABLET EVERY DAY NEEDS FOR OFFICE USE VISIT FOR MORE REFILLS. 90 tablet 2   Pseudoephedrine HCl (SUDAFED PO) Take 1 tablet by mouth daily as needed (cold symptons).     rizatriptan (MAXALT-MLT) 10 MG disintegrating tablet TAKE 1 TABLET (10 MG TOTAL) BY MOUTH DAILY AS NEEDED FOR MIGRAINE. MAY REPEAT FOR ONE DOSE 2 HOURS AFTER THE FIRST ONE  IF NEEDED. 10 tablet 0   sodium chloride (OCEAN) 0.65 % SOLN nasal spray Place 1 spray into both nostrils every 4 (four) hours as needed for congestion.      traMADol (ULTRAM) 50 MG tablet 0.5-1 Tablet(s) By Mouth Twice Daily PRN     White Petrolatum-Mineral Oil (LUBRICANT EYE) OINT Place 1 application into both eyes 2 (two) times daily as needed (for dry eyes).      [DISCONTINUED] zolmitriptan (ZOMIG-ZMT) 5 MG disintegrating tablet Take 1 tablet (5 mg total) by mouth daily as needed for migraine. 10 tablet 0   No current facility-administered medications on file prior to visit.     ROS see history of present illness  Objective  Physical Exam Vitals:   03/16/22 0908  BP: 115/70  Pulse: 76  Temp: (!) 97.5 F (36.4 C)  SpO2: 97%    BP Readings from Last 3 Encounters:  03/16/22 115/70  02/28/22 118/78  02/10/22 118/70   Wt Readings from Last 3 Encounters:  03/16/22 100 lb (45.4 kg)  02/28/22 100 lb 9.6 oz (45.6 kg)  02/10/22 100 lb 6.4 oz (45.5 kg)    Physical Exam Constitutional:      General: She is not in acute distress.    Appearance: She is not diaphoretic.  Cardiovascular:     Rate and Rhythm: Normal rate and regular rhythm.     Heart sounds: Normal heart sounds.  Pulmonary:     Effort: Pulmonary effort is normal.     Breath sounds: Wheezing (Expiratory wheezing throughout) present.  Skin:    General: Skin is warm and dry.  Neurological:     Mental Status: She is alert.      Assessment/Plan: Please see individual problem list.  Mild persistent asthma without complication Assessment & Plan: Chronic issue with exacerbation.  We will treat with prednisone 40 mg daily for 5 days.  She will use her albuterol inhaler 2 puffs every 6 hours on a schedule for 2 days and then every 6 hours as needed.  If she has any worsening symptoms she will seek medical attention.  Advised on the risk of sleeping issues and agitation with the prednisone.  Orders: -      predniSONE; Take 2 tablets (40 mg total) by mouth daily with breakfast.  Dispense: 10 tablet; Refill: 0  Arthritis Assessment & Plan: Chronic issue.  She can continue Tylenol over-the-counter and occasional use of tramadol and NSAIDs to help control her pain.   B12 deficiency -     Cyanocobalamin    Return in about 3 months (around 06/15/2022).   Tommi Rumps, MD [place]

## 2022-03-16 NOTE — Patient Instructions (Signed)
Nice to see you. If you develop excessive agitation or inability to sleep on the prednisone please let us know. Please use your albuterol inhaler 2 puffs every 6 hours for the next 2 days and then you can go back to every 6 hours as needed. If you have any worsening symptoms please seek medical attention.

## 2022-03-16 NOTE — Assessment & Plan Note (Signed)
Chronic issue with exacerbation.  We will treat with prednisone 40 mg daily for 5 days.  She will use her albuterol inhaler 2 puffs every 6 hours on a schedule for 2 days and then every 6 hours as needed.  If she has any worsening symptoms she will seek medical attention.  Advised on the risk of sleeping issues and agitation with the prednisone.

## 2022-03-22 ENCOUNTER — Other Ambulatory Visit: Payer: Self-pay | Admitting: Family Medicine

## 2022-03-22 DIAGNOSIS — N951 Menopausal and female climacteric states: Secondary | ICD-10-CM

## 2022-03-31 ENCOUNTER — Ambulatory Visit (INDEPENDENT_AMBULATORY_CARE_PROVIDER_SITE_OTHER): Payer: Medicare Other | Admitting: Physician Assistant

## 2022-03-31 ENCOUNTER — Encounter: Payer: Self-pay | Admitting: Physician Assistant

## 2022-03-31 VITALS — BP 106/60 | HR 63 | Ht 63.0 in | Wt 102.1 lb

## 2022-03-31 DIAGNOSIS — R1110 Vomiting, unspecified: Secondary | ICD-10-CM | POA: Diagnosis not present

## 2022-03-31 DIAGNOSIS — R1013 Epigastric pain: Secondary | ICD-10-CM

## 2022-03-31 DIAGNOSIS — K224 Dyskinesia of esophagus: Secondary | ICD-10-CM | POA: Diagnosis not present

## 2022-03-31 MED ORDER — PANTOPRAZOLE SODIUM 40 MG PO TBEC: 40.0000 mg | DELAYED_RELEASE_TABLET | Freq: Two times a day (BID) | ORAL | 0 refills | Status: DC

## 2022-03-31 NOTE — Patient Instructions (Signed)
We have sent the following medications to your pharmacy for you to pick up at your convenience: Pantoprazole 40 mg twice daily (increase from previous dosage)  You have been scheduled for an endoscopy. Please follow written instructions given to you at your visit today. If you use inhalers (even only as needed), please bring them with you on the day of your procedure.  _______________________________________________________  If you are age 73 or older, your body mass index should be between 23-30. Your Body mass index is 18.09 kg/m. If this is out of the aforementioned range listed, please consider follow up with your Primary Care Provider.  If you are age 59 or younger, your body mass index should be between 19-25. Your Body mass index is 18.09 kg/m. If this is out of the aformentioned range listed, please consider follow up with your Primary Care Provider.   ________________________________________________________  The [facility] providers would like to encourage you to use [facility] to communicate with providers for non-urgent requests or questions.  Due to long hold times on the telephone, sending your provider a message by [facility] may be a faster and more efficient way to get a response.  Please allow 48 business hours for a response.  Please remember that this is for non-urgent requests.  _______________________________________________________  Due to recent changes in healthcare laws, you may see the results of your imaging and laboratory studies on MyChart before your provider has had a chance to review them.  We understand that in some cases there may be results that are confusing or concerning to you. Not all laboratory results come back in the same time frame and the provider may be waiting for multiple results in order to interpret others.  Please give [place] 48 hours in order for your provider to thoroughly review all the results before contacting the office for clarification of your  results.

## 2022-03-31 NOTE — Progress Notes (Signed)
Addendum: Reviewed and agree with assessment and management plan. Jemaine Prokop M, MD  

## 2022-03-31 NOTE — H&P (View-Only) (Signed)
Chief Complaint: Nausea, Vomiting and Epigastric abdominal pain  HPI:    Anne [name] is a 72 year old Caucasian female with a past medical history as listed below including achalasia of the esophagus, CVA, COPD, IBS and multiple others, known to Dr. Hilarie Fredrickson, who presents to clinic today with complaint of nausea, vomiting epigastric pain.    06/16/2021 EGD at the hospital with Botox injections for hypertensive LES with favorable response to Botox injection in the past last performed May 2022.  At that time esophagus was normal and the LES was injected with Botox in 4 quadrants.    Today, the patient tells me that really since the end of September/October she has been having some increasing trouble with vomiting again and knows it is related to her sphincter.  She tells me she thinks it is time for another Botox injection.  Explains that these episodes did feel slightly different though and that her stomach has remained painful since then, she describes epigastric pain which is worse with caffeine and spicy foods and really after eating in general.  She takes a lot of Gas-X, but wonders if she has an ulcer or something else as well as her normal issues.  She is on Pantoprazole 40 mg once a day.  Does describe using the high-dose Ibuprofen and extra strength Tylenol arthritis 4 to 6 tablets a day for many years.    Since being seen last patient has had her trach removed in June and has been off of Plavix for the past 4 to 6 months due to bleeding issues.    Denies fever, chills, melena or weight loss.  Past Medical History:  Diagnosis Date   Achalasia of esophagus    Allergic rhinitis    Allergy    multiple, mostly aspirin, levaquin and shellfish.   Anemia    Anxiety    Arthritis    Asthma    Bilateral leg edema    Cataract 2014   exraction with len implant   Chronic pain syndrome    Chronic pulmonary aspiration    Complication of anesthesia    Breathing problems upon waking up. 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 02/20/17- last time no problem.   Compressed cervical disc    COPD (chronic obstructive pulmonary disease) (HCC)    CVA (cerebral infarction)    Dyspnea    Esophageal dysmotility    Gastritis    H/O food anaphylaxis    Heart murmur    as child   History of hiatal hernia    Hypokalemia    IBS (irritable bowel syndrome)    Kidney lesion 2018   Left   Liver lesion    Migraine    PICC (peripherally inserted central catheter) removal 02/20/2017   PONV (postoperative nausea and vomiting)    Problems with swallowing    intermittently   Pulmonary fibrosis (HCC)    Renal cell carcinoma ([place])    small- left kidney   Seizures ([place])    only in elementary school-was on medication and was tx up until 5th grade and has had no more seizures since then   Shingles    Stroke ([place])    slurred speech, drawn face, imaging normal, occurred twice, [facility]-"TIA" if antything" 02/20/17-no residual effects   Tracheal granulation    Tracheostomy in place [facility])    Vocal cord paresis     Past Surgical History:  Procedure Laterality Date   ABDOMINAL HYSTERECTOMY     APPLICATION OF A-CELL OF HEAD/NECK N/A 02/21/2017  Procedure: APPLICATION OF A-CELL OF HEAD/NECK;  Surgeon: Wallace Going, DO;  Location: [place];  Service: Plastics;  Laterality: N/A;   BACK SURGERY     BOTOX INJECTION N/A 07/29/2013   Procedure: BOTOX INJECTION;  Surgeon: Jerene Bears, MD;  Location[facility];  Service: Gastroenterology;  Laterality: N/A;   BOTOX INJECTION N/A 05/04/2015   Procedure: BOTOX INJECTION;  Surgeon: Jerene Bears, MD;  [facility];  Service: Gastroenterology;  Laterality: N/A;   BOTOX INJECTION N/A 02/18/2018   Procedure: BOTOX INJECTION;  Surgeon: Jerene Bears, MD;  [facility];  Service: Gastroenterology;  Laterality: N/A;   BOTOX INJECTION N/A 06/30/2019   Procedure: BOTOX INJECTION;  Surgeon: Jerene Bears, MD;  [facility];  Service:  Gastroenterology;  Laterality: N/A;   BOTOX INJECTION N/A 08/05/2020   Procedure: BOTOX INJECTION;  Surgeon: Jerene Bears, MD;  [facility];  Service: Gastroenterology;  Laterality: N/A;   BOTOX INJECTION N/A 06/16/2021   Procedure: BOTOX INJECTION;  Surgeon: Jerene Bears, MD;  [facility];  Service: Gastroenterology;  Laterality: N/A;   BREAST BIOPSY Left    neg   BREAST SURGERY Left 2002   bx of skin   CHOLECYSTECTOMY     COLONOSCOPY     DIAGNOSTIC LAPAROSCOPY     ESOPHAGEAL MANOMETRY N/A 12/16/2012   Procedure: ESOPHAGEAL MANOMETRY (EM);  Surgeon: Jerene Bears, MD;  [facility];  Service: Gastroenterology;  Laterality: N/A;   ESOPHAGOGASTRODUODENOSCOPY (EGD) WITH PROPOFOL N/A 07/29/2013   Procedure: ESOPHAGOGASTRODUODENOSCOPY (EGD) WITH PROPOFOL;  Surgeon: Jerene Bears, MD;  [facility];  Service: Gastroenterology;  Laterality: N/A;  with botox injection   ESOPHAGOGASTRODUODENOSCOPY (EGD) WITH PROPOFOL N/A 05/04/2015   Procedure: ESOPHAGOGASTRODUODENOSCOPY (EGD) WITH PROPOFOL;  Surgeon: Jerene Bears, MD;  [facility];  Service: Gastroenterology;  Laterality: N/A;   ESOPHAGOGASTRODUODENOSCOPY (EGD) WITH PROPOFOL N/A 02/18/2018   Procedure: ESOPHAGOGASTRODUODENOSCOPY (EGD) WITH PROPOFOL;  Surgeon: Jerene Bears, MD;  [facility];  Service: Gastroenterology;  Laterality: N/A;   ESOPHAGOGASTRODUODENOSCOPY (EGD) WITH PROPOFOL N/A 06/30/2019   Procedure: ESOPHAGOGASTRODUODENOSCOPY (EGD) WITH PROPOFOL;  Surgeon: Jerene Bears, MD;  [facility];  Service: Gastroenterology;  Laterality: N/A;   ESOPHAGOGASTRODUODENOSCOPY (EGD) WITH PROPOFOL N/A 08/05/2020   Procedure: ESOPHAGOGASTRODUODENOSCOPY (EGD) WITH PROPOFOL;  Surgeon: Jerene Bears, MD;  [facility];  Service: Gastroenterology;  Laterality: N/A;   ESOPHAGOGASTRODUODENOSCOPY (EGD) WITH PROPOFOL N/A 06/16/2021   Procedure: ESOPHAGOGASTRODUODENOSCOPY (EGD)  WITH PROPOFOL;  Surgeon: Jerene Bears, MD;  [facility];  Service: Gastroenterology;  Laterality: N/A;   EYE SURGERY     Catarct surgery 2014   Eye Surgery AS Child Left    INCISION AND DRAINAGE OF WOUND N/A 02/21/2017   Procedure: IRRIGATION AND DEBRIDEMENT WOUND NECK;  Surgeon: Wallace Going, DO;  Location: [place];  Service: Plastics;  Laterality: N/A;   JEJUNOSTOMY FEEDING TUBE     x2 both failed. no longer has   LUMBAR LAMINECTOMY/DECOMPRESSION MICRODISCECTOMY Bilateral 05/13/2018   Procedure: Laminectomy and Foraminotomy - Lumbar four-Lumbar five- bilateral;  Surgeon: Eustace Moore, MD;  Location: [place];  Service: Neurosurgery;  Laterality: Bilateral;   LUMBAR LAMINECTOMY/DECOMPRESSION MICRODISCECTOMY Bilateral 11/08/2020   Procedure: Laminectomy and Foraminotomy - bilateral - Lumbar two-Lumbar three - Lumbar three-Lumbar four;  Surgeon: Eustace Moore, MD;  Location: [place];  Service: Neurosurgery;  Laterality: Bilateral;   MULTIPLE TOOTH EXTRACTIONS     2 teeth removed  OOPHORECTOMY     POSTERIOR CERVICAL FUSION/FORAMINOTOMY N/A 01/10/2017   Procedure: LAMINECTOMY AND FORAMINOTOMY CERVICAL FOUR-CERVICAL FIVE, CERVICAL FIVE-SIX POSTERIOR CERVICAL INSTRUMENT FUSION CERVICAL THREE-CERVICAL SEVEN,CERVICAL LAMINECTOMY CERVICAL THREE-CERVICAL SEVEN.;  Surgeon: Eustace Moore, MD;  Location: [place];  Service: Neurosurgery;  Laterality: N/A;  posterior   TONSILLECTOMY     TRACHEOSTOMY  1996   done at [facility], Dr. Kathyrn Sheriff   TRACHEOSTOMY REVISION N/A 09/14/2021   Procedure: TRACHEOSTOMA REVISION, TRACHEOPLASTY;  Surgeon: Margaretha Sheffield, MD;  Location: [facility];  Service: ENT;  Laterality: N/A;   TUBAL LIGATION     VIDEO BRONCHOSCOPY Bilateral 11/20/2012   Procedure: VIDEO BRONCHOSCOPY WITH FLUORO;  Surgeon: Juanito Doom, MD;  [facility];  Service: Cardiopulmonary;  Laterality: Bilateral;    Current Outpatient Medications  Medication Sig Dispense Refill    acetaminophen (TYLENOL) 500 MG tablet Take 1,000 mg by mouth 2 (two) times daily as needed for moderate pain or headache.     albuterol (VENTOLIN HFA) 108 (90 Base) MCG/ACT inhaler Inhale 2 puffs into the lungs every 6 (six) hours as needed for wheezing or shortness of breath. 18 g 3   azelastine (ASTELIN) 0.1 % nasal spray Place 1 spray into both nostrils 2 (two) times daily. Use in each nostril as directed (Patient taking differently: Place 1 spray into both nostrils as needed for allergies. Use in each nostril as directed) 30 mL 6   cetirizine (ZYRTEC) 10 MG tablet TAKE 1 TABLET BY MOUTH EVERY DAY (NOT COVERED 90 tablet 0   Cyanocobalamin (VITAMIN B-12 IJ) Inject 1 Dose as directed every 30 (thirty) days.     diphenhydrAMINE (BENADRYL) 25 MG tablet Take 50 mg by mouth daily as needed for allergies (Asthma).     DULoxetine (CYMBALTA) 30 MG capsule Take 1 capsule (30 mg total) by mouth daily. 30 capsule 1   EPINEPHrine 0.3 mg/0.3 mL IJ SOAJ injection Inject 0.3 mg into the muscle as needed for anaphylaxis.      estradiol (ESTRACE) 1 MG tablet TAKE 1 TABLET BY MOUTH EVERY DAY 90 tablet 1   Fluticasone-Umeclidin-Vilant (TRELEGY ELLIPTA) 100-62.5-25 MCG/ACT AEPB Inhale 1 puff into the lungs daily. 60 each 0   Fluticasone-Umeclidin-Vilant (TRELEGY ELLIPTA) 100-62.5-25 MCG/ACT AEPB Inhale 1 puff into the lungs daily. 28 each 0   Fluticasone-Umeclidin-Vilant (TRELEGY ELLIPTA) 100-62.5-25 MCG/ACT AEPB Inhale 1 puff into the lungs daily. 42 each 0   furosemide (LASIX) 20 MG tablet TAKE 1 TABLET BY MOUTH EVERY DAY AS NEEDED 90 tablet 1   guaiFENesin-codeine 100-10 MG/5ML syrup Take 5 mLs by mouth 2 (two) times daily as needed for cough. 120 mL 0   ibuprofen (ADVIL,MOTRIN) 200 MG tablet Take 400-800 mg by mouth daily as needed for headache or moderate pain.     ipratropium-albuterol (DUONEB) 0.5-2.5 (3) MG/3ML SOLN Take 3 mLs by nebulization every 4 (four) hours as needed. Dx 496 120 mL 2   methocarbamol  (ROBAXIN) 500 MG tablet Take 500 mg by mouth 4 (four) times daily.     montelukast (SINGULAIR) 10 MG tablet TAKE 1 TABLET BY MOUTH EVERYDAY AT BEDTIME 90 tablet 1   Naphazoline-Pheniramine 0.027-0.315 % SOLN Place 1 drop into both eyes daily as needed (allergies).      ondansetron (ZOFRAN-ODT) 4 MG disintegrating tablet Take 1 tablet (4 mg total) by mouth every 6 (six) hours as needed for nausea or vomiting. 30 tablet 1   oxyCODONE (OXY IR/ROXICODONE) 5 MG immediate release tablet Take 5 mg by mouth 2 (two)  times daily as needed.     pantoprazole (PROTONIX) 40 MG tablet TAKE ONE TABLET EVERY DAY NEEDS FOR OFFICE USE VISIT FOR MORE REFILLS. 90 tablet 2   predniSONE (DELTASONE) 20 MG tablet Take 2 tablets (40 mg total) by mouth daily with breakfast. 10 tablet 0   Pseudoephedrine HCl (SUDAFED PO) Take 1 tablet by mouth daily as needed (cold symptons).     rizatriptan (MAXALT-MLT) 10 MG disintegrating tablet TAKE 1 TABLET (10 MG TOTAL) BY MOUTH DAILY AS NEEDED FOR MIGRAINE. MAY REPEAT FOR ONE DOSE 2 HOURS AFTER THE FIRST ONE IF NEEDED. 10 tablet 0   sodium chloride (OCEAN) 0.65 % SOLN nasal spray Place 1 spray into both nostrils every 4 (four) hours as needed for congestion.      traMADol (ULTRAM) 50 MG tablet 0.5-1 Tablet(s) By Mouth Twice Daily PRN     White Petrolatum-[place] Oil (LUBRICANT EYE) OINT Place 1 application into both eyes 2 (two) times daily as needed (for dry eyes).      No current facility-administered medications for this visit.    Allergies as of 03/31/2022 - Review Complete 03/31/2022  Allergen Reaction Noted   Asa [aspirin] Shortness Of Breath, Swelling, and Other (See Comments) 08/05/2012   Bee venom Anaphylaxis and Shortness Of Breath 02/08/2017   Epinephrine Shortness Of Breath and Palpitations 10/23/2013   Feraheme [ferumoxytol] Shortness Of Breath 10/31/2017   Influenza vaccines Shortness Of Breath and Other (See Comments) 01/14/2013   Pfizer-biontech covid-19 vacc  [covid-19 mrna vacc (moderna)] Anaphylaxis 08/19/2019   Quinolones Swelling and Other (See Comments) 07/01/2013   Shellfish allergy Anaphylaxis 08/13/2012   Ciprofloxacin Swelling and Other (See Comments) 07/01/2013   Clarithromycin Nausea Only 10/23/2013   Erythromycin Nausea Only 10/23/2013   Levaquin [levofloxacin in d5w] Swelling and Other (See Comments) 08/05/2012   Peanut-containing drug products Other (See Comments) 01/04/2017   Septra [sulfamethoxazole-trimethoprim] Diarrhea 04/18/2013   Telithromycin Nausea Only 10/23/2013   Zanaflex [tizanidine] Other (See Comments) 02/08/2017   Adhesive [tape] Rash and Other (See Comments) 08/03/2016    Family History  Problem Relation Age of Onset   Asthma Cousin    COPD Cousin    Breast cancer Maternal Grandmother 25   Asthma Father    Kidney cancer Father    Arrhythmia Father    Lung cancer Father    Lung cancer Paternal Uncle    COPD Paternal Grandfather    Alcohol abuse Mother    Breast cancer Maternal Aunt 54   Bladder Cancer Neg Hx    Colon cancer Neg Hx    Esophageal cancer Neg Hx    Pancreatic cancer Neg Hx    Stomach cancer Neg Hx    Liver disease Neg Hx     Social History   Socioeconomic History   Marital status: Married    Spouse name: Not on file   Number of children: 4   Years of education: Not on file   Highest education level: Not on file  Occupational History   Not on file  Tobacco Use   Smoking status: Never   Smokeless tobacco: Never  Vaping Use   Vaping Use: Never used  Substance and Sexual Activity   Alcohol use: Yes    Comment: occ wine   Drug use: No   Sexual activity: Yes  Other Topics Concern   Not on file  Social History Narrative   Lives in [place] with husband.  She has four children.   Retired from [employer]  Social Determinants of Health   Financial Resource Strain: Low Risk  (02/02/2022)   Overall Financial Resource Strain (CARDIA)    Difficulty of Paying Living  Expenses: Not hard at all  Food Insecurity: No Food Insecurity (02/02/2022)   Hunger Vital Sign    Worried About Running Out of Food in the Last Year: Never true    Ran Out of Food in the Last Year: Never true  Transportation Needs: No Transportation Needs (02/02/2022)   PRAPARE - Hydrologist (Medical): No    Lack of Transportation (Non-Medical): No  Physical Activity: Sufficiently Active (02/02/2022)   Exercise Vital Sign    Days of Exercise per Week: 5 days    Minutes of Exercise per Session: 30 min  Stress: No Stress Concern Present (02/02/2022)   [place]    Feeling of Stress : Not at all  Social Connections: Unknown (02/02/2022)   Social Connection and Isolation Panel [NHANES]    Frequency of Communication with Friends and Family: More than three times a week    Frequency of Social Gatherings with Friends and Family: More than three times a week    Attends Religious Services: Not on file    Active Member of Clubs or Organizations: Yes    Attends Archivist Meetings: More than 4 times per year    Marital Status: Married  Human resources officer Violence: Not At Risk (02/02/2022)   Humiliation, Afraid, Rape, and Kick questionnaire    Fear of Current or Ex-Partner: No    Emotionally Abused: No    Physically Abused: No    Sexually Abused: No    Review of Systems:    Constitutional: No weight loss, fever or chills Cardiovascular: No chest pain Respiratory: No SOB  Gastrointestinal: See HPI and otherwise negative   Physical Exam:  Vital signs: BP 106/60   Pulse 63   Ht '5\' 3"'$  (1.6 m)   Wt 102 lb 2 oz (46.3 kg)   BMI 18.09 kg/m   Constitutional:   Pleasant thin appearing elderly Caucasian female appears to be in NAD, Well developed, Well nourished, alert and cooperative Respiratory: Respirations even and unlabored. Lungs clear to auscultation bilaterally.   No wheezes, crackles,  or rhonchi.  Cardiovascular: Normal S1, S2. No MRG. Regular rate and rhythm. No peripheral edema, cyanosis or pallor.  Gastrointestinal:  Soft, nondistended, moderate epigastric ttp with some involuntary guarding. Normal bowel sounds. No appreciable masses or hepatomegaly. Rectal:  Not performed.  Psychiatric: Demonstrates good judgement and reason without abnormal affect or behaviors.  RELEVANT LABS AND IMAGING: CBC    Component Value Date/Time   WBC 4.6 09/06/2021 0856   RBC 3.78 (L) 09/06/2021 0856   HGB 12.3 09/06/2021 0856   HGB 12.6 02/17/2014 1303   HCT 36.8 09/06/2021 0856   HCT 37.9 02/17/2014 1303   PLT 175.0 09/06/2021 0856   PLT 208 02/17/2014 1303   MCV 97.5 09/06/2021 0856   MCV 97 02/17/2014 1303   MCH 32.8 10/27/2020 1139   MCHC 33.5 09/06/2021 0856   RDW 12.7 09/06/2021 0856   RDW 12.5 02/17/2014 1303   LYMPHSABS 1.3 10/27/2020 1139   LYMPHSABS 1.2 02/17/2014 1303   MONOABS 0.6 10/27/2020 1139   MONOABS 0.3 02/17/2014 1303   EOSABS 0.2 10/27/2020 1139   EOSABS 0.2 02/17/2014 1303   BASOSABS 0.0 10/27/2020 1139   BASOSABS 0.0 02/17/2014 1303    CMP     Component  Value Date/Time   NA 141 09/06/2021 0856   K 3.8 09/06/2021 0856   K 3.6 04/03/2012 1653   CL 105 09/06/2021 0856   CO2 30 09/06/2021 0856   GLUCOSE 76 09/06/2021 0856   BUN 15 09/06/2021 0856   CREATININE 1.02 09/06/2021 0856   CREATININE 0.94 10/30/2016 1135   CALCIUM 9.5 09/06/2021 0856   PROT 6.3 09/06/2021 0856   ALBUMIN 4.0 09/06/2021 0856   AST 16 09/06/2021 0856   ALT 10 09/06/2021 0856   ALKPHOS 99 09/06/2021 0856   BILITOT 0.5 09/06/2021 0856   GFRNONAA >60 10/27/2020 1139   GFRAA >60 05/07/2018 1327    Assessment: 1.  GERD: Chronically on Pantoprazole 40 mg daily but now with increased epigastric pain; likely gastritis 2.  Esophageal dysphagia with hypertensive LES: Previous response to Botox x 4 so far 3.  History of CVA: No longer on Plavix 4.  Epigastric pain: Likely  with GERD/gastritis as above  Plan: 1.  Scheduled patient for repeat EGD with Botox injections at the hospital Dr. Hilarie Fredrickson.  Did provide the patient a detailed list risks for the procedure and she agrees to proceed. 2.  Increased Pantoprazole to 40 mg twice daily, 30-60 minutes before breakfast and dinner.  #60 with 3 refills. 3.  Patient to follow in clinic per recommendations from Dr. Hilarie Fredrickson after time of procedure.  Anne Newer, PA-C [facility] 03/31/2022, 10:43 AM  Cc: [name], MD

## 2022-03-31 NOTE — Progress Notes (Signed)
Chief Complaint: Nausea, Vomiting and Epigastric abdominal pain  HPI:    Anne [name] is a 72 year old Caucasian female with a past medical history as listed below including achalasia of the esophagus, CVA, COPD, IBS and multiple others, known to Dr. Hilarie Fredrickson, who presents to clinic today with complaint of nausea, vomiting epigastric pain.    06/16/2021 EGD at the hospital with Botox injections for hypertensive LES with favorable response to Botox injection in the past last performed May 2022.  At that time esophagus was normal and the LES was injected with Botox in 4 quadrants.    Today, the patient tells me that really since the end of September/October she has been having some increasing trouble with vomiting again and knows it is related to her sphincter.  She tells me she thinks it is time for another Botox injection.  Explains that these episodes did feel slightly different though and that her stomach has remained painful since then, she describes epigastric pain which is worse with caffeine and spicy foods and really after eating in general.  She takes a lot of Gas-X, but wonders if she has an ulcer or something else as well as her normal issues.  She is on Pantoprazole 40 mg once a day.  Does describe using the high-dose Ibuprofen and extra strength Tylenol arthritis 4 to 6 tablets a day for many years.    Since being seen last patient has had her trach removed in June and has been off of Plavix for the past 4 to 6 months due to bleeding issues.    Denies fever, chills, melena or weight loss.  Past Medical History:  Diagnosis Date   Achalasia of esophagus    Allergic rhinitis    Allergy    multiple, mostly aspirin, levaquin and shellfish.   Anemia    Anxiety    Arthritis    Asthma    Bilateral leg edema    Cataract 2014   exraction with len implant   Chronic pain syndrome    Chronic pulmonary aspiration    Complication of anesthesia    Breathing problems upon waking up. 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 02/20/17- last time no problem.   Compressed cervical disc    COPD (chronic obstructive pulmonary disease) (HCC)    CVA (cerebral infarction)    Dyspnea    Esophageal dysmotility    Gastritis    H/O food anaphylaxis    Heart murmur    as child   History of hiatal hernia    Hypokalemia    IBS (irritable bowel syndrome)    Kidney lesion 2018   Left   Liver lesion    Migraine    PICC (peripherally inserted central catheter) removal 02/20/2017   PONV (postoperative nausea and vomiting)    Problems with swallowing    intermittently   Pulmonary fibrosis (HCC)    Renal cell carcinoma ([place])    small- left kidney   Seizures ([place])    only in elementary school-was on medication and was tx up until 5th grade and has had no more seizures since then   Shingles    Stroke ([place])    slurred speech, drawn face, imaging normal, occurred twice, [facility]-"TIA" if antything" 02/20/17-no residual effects   Tracheal granulation    Tracheostomy in place [facility])    Vocal cord paresis     Past Surgical History:  Procedure Laterality Date   ABDOMINAL HYSTERECTOMY     APPLICATION OF A-CELL OF HEAD/NECK N/A 02/21/2017  Procedure: APPLICATION OF A-CELL OF HEAD/NECK;  Surgeon: Wallace Going, DO;  Location: [place];  Service: Plastics;  Laterality: N/A;   BACK SURGERY     BOTOX INJECTION N/A 07/29/2013   Procedure: BOTOX INJECTION;  Surgeon: Jerene Bears, MD;  [facility];  Service: Gastroenterology;  Laterality: N/A;   BOTOX INJECTION N/A 05/04/2015   Procedure: BOTOX INJECTION;  Surgeon: Jerene Bears, MD;  [facility];  Service: Gastroenterology;  Laterality: N/A;   BOTOX INJECTION N/A 02/18/2018   Procedure: BOTOX INJECTION;  Surgeon: Jerene Bears, MD;  [facility];  Service: Gastroenterology;  Laterality: N/A;   BOTOX INJECTION N/A 06/30/2019   Procedure: BOTOX INJECTION;  Surgeon: Jerene Bears, MD;  [facility];  Service:  Gastroenterology;  Laterality: N/A;   BOTOX INJECTION N/A 08/05/2020   Procedure: BOTOX INJECTION;  Surgeon: Jerene Bears, MD;  [facility];  Service: Gastroenterology;  Laterality: N/A;   BOTOX INJECTION N/A 06/16/2021   Procedure: BOTOX INJECTION;  Surgeon: Jerene Bears, MD;  [facility];  Service: Gastroenterology;  Laterality: N/A;   BREAST BIOPSY Left    neg   BREAST SURGERY Left 2002   bx of skin   CHOLECYSTECTOMY     COLONOSCOPY     DIAGNOSTIC LAPAROSCOPY     ESOPHAGEAL MANOMETRY N/A 12/16/2012   Procedure: ESOPHAGEAL MANOMETRY (EM);  Surgeon: Jerene Bears, MD;  [facility];  Service: Gastroenterology;  Laterality: N/A;   ESOPHAGOGASTRODUODENOSCOPY (EGD) WITH PROPOFOL N/A 07/29/2013   Procedure: ESOPHAGOGASTRODUODENOSCOPY (EGD) WITH PROPOFOL;  Surgeon: Jerene Bears, MD;  [facility];  Service: Gastroenterology;  Laterality: N/A;  with botox injection   ESOPHAGOGASTRODUODENOSCOPY (EGD) WITH PROPOFOL N/A 05/04/2015   Procedure: ESOPHAGOGASTRODUODENOSCOPY (EGD) WITH PROPOFOL;  Surgeon: Jerene Bears, MD;  [facility];  Service: Gastroenterology;  Laterality: N/A;   ESOPHAGOGASTRODUODENOSCOPY (EGD) WITH PROPOFOL N/A 02/18/2018   Procedure: ESOPHAGOGASTRODUODENOSCOPY (EGD) WITH PROPOFOL;  Surgeon: Jerene Bears, MD;  [facility];  Service: Gastroenterology;  Laterality: N/A;   ESOPHAGOGASTRODUODENOSCOPY (EGD) WITH PROPOFOL N/A 06/30/2019   Procedure: ESOPHAGOGASTRODUODENOSCOPY (EGD) WITH PROPOFOL;  Surgeon: Jerene Bears, MD;  [facility];  Service: Gastroenterology;  Laterality: N/A;   ESOPHAGOGASTRODUODENOSCOPY (EGD) WITH PROPOFOL N/A 08/05/2020   Procedure: ESOPHAGOGASTRODUODENOSCOPY (EGD) WITH PROPOFOL;  Surgeon: Jerene Bears, MD;  [facility];  Service: Gastroenterology;  Laterality: N/A;   ESOPHAGOGASTRODUODENOSCOPY (EGD) WITH PROPOFOL N/A 06/16/2021   Procedure: ESOPHAGOGASTRODUODENOSCOPY (EGD)  WITH PROPOFOL;  Surgeon: Jerene Bears, MD;  [facility];  Service: Gastroenterology;  Laterality: N/A;   EYE SURGERY     Catarct surgery 2014   Eye Surgery AS Child Left    INCISION AND DRAINAGE OF WOUND N/A 02/21/2017   Procedure: IRRIGATION AND DEBRIDEMENT WOUND NECK;  Surgeon: Wallace Going, DO;  Location: [place];  Service: Plastics;  Laterality: N/A;   JEJUNOSTOMY FEEDING TUBE     x2 both failed. no longer has   LUMBAR LAMINECTOMY/DECOMPRESSION MICRODISCECTOMY Bilateral 05/13/2018   Procedure: Laminectomy and Foraminotomy - Lumbar four-Lumbar five- bilateral;  Surgeon: Eustace Moore, MD;  Location: [place];  Service: Neurosurgery;  Laterality: Bilateral;   LUMBAR LAMINECTOMY/DECOMPRESSION MICRODISCECTOMY Bilateral 11/08/2020   Procedure: Laminectomy and Foraminotomy - bilateral - Lumbar two-Lumbar three - Lumbar three-Lumbar four;  Surgeon: Eustace Moore, MD;  Location: [place];  Service: Neurosurgery;  Laterality: Bilateral;   MULTIPLE TOOTH EXTRACTIONS     2 teeth removed  OOPHORECTOMY     POSTERIOR CERVICAL FUSION/FORAMINOTOMY N/A 01/10/2017   Procedure: LAMINECTOMY AND FORAMINOTOMY CERVICAL FOUR-CERVICAL FIVE, CERVICAL FIVE-SIX POSTERIOR CERVICAL INSTRUMENT FUSION CERVICAL THREE-CERVICAL SEVEN,CERVICAL LAMINECTOMY CERVICAL THREE-CERVICAL SEVEN.;  Surgeon: Eustace Moore, MD;  Location: [place];  Service: Neurosurgery;  Laterality: N/A;  posterior   TONSILLECTOMY     TRACHEOSTOMY  1996   done at [facility], Dr. Kathyrn Sheriff   TRACHEOSTOMY REVISION N/A 09/14/2021   Procedure: TRACHEOSTOMA REVISION, TRACHEOPLASTY;  Surgeon: Margaretha Sheffield, MD;  Location: [facility];  Service: ENT;  Laterality: N/A;   TUBAL LIGATION     VIDEO BRONCHOSCOPY Bilateral 11/20/2012   Procedure: VIDEO BRONCHOSCOPY WITH FLUORO;  Surgeon: Juanito Doom, MD;  [facility];  Service: Cardiopulmonary;  Laterality: Bilateral;    Current Outpatient Medications  Medication Sig Dispense Refill    acetaminophen (TYLENOL) 500 MG tablet Take 1,000 mg by mouth 2 (two) times daily as needed for moderate pain or headache.     albuterol (VENTOLIN HFA) 108 (90 Base) MCG/ACT inhaler Inhale 2 puffs into the lungs every 6 (six) hours as needed for wheezing or shortness of breath. 18 g 3   azelastine (ASTELIN) 0.1 % nasal spray Place 1 spray into both nostrils 2 (two) times daily. Use in each nostril as directed (Patient taking differently: Place 1 spray into both nostrils as needed for allergies. Use in each nostril as directed) 30 mL 6   cetirizine (ZYRTEC) 10 MG tablet TAKE 1 TABLET BY MOUTH EVERY DAY (NOT COVERED 90 tablet 0   Cyanocobalamin (VITAMIN B-12 IJ) Inject 1 Dose as directed every 30 (thirty) days.     diphenhydrAMINE (BENADRYL) 25 MG tablet Take 50 mg by mouth daily as needed for allergies (Asthma).     DULoxetine (CYMBALTA) 30 MG capsule Take 1 capsule (30 mg total) by mouth daily. 30 capsule 1   EPINEPHrine 0.3 mg/0.3 mL IJ SOAJ injection Inject 0.3 mg into the muscle as needed for anaphylaxis.      estradiol (ESTRACE) 1 MG tablet TAKE 1 TABLET BY MOUTH EVERY DAY 90 tablet 1   Fluticasone-Umeclidin-Vilant (TRELEGY ELLIPTA) 100-62.5-25 MCG/ACT AEPB Inhale 1 puff into the lungs daily. 60 each 0   Fluticasone-Umeclidin-Vilant (TRELEGY ELLIPTA) 100-62.5-25 MCG/ACT AEPB Inhale 1 puff into the lungs daily. 28 each 0   Fluticasone-Umeclidin-Vilant (TRELEGY ELLIPTA) 100-62.5-25 MCG/ACT AEPB Inhale 1 puff into the lungs daily. 42 each 0   furosemide (LASIX) 20 MG tablet TAKE 1 TABLET BY MOUTH EVERY DAY AS NEEDED 90 tablet 1   guaiFENesin-codeine 100-10 MG/5ML syrup Take 5 mLs by mouth 2 (two) times daily as needed for cough. 120 mL 0   ibuprofen (ADVIL,MOTRIN) 200 MG tablet Take 400-800 mg by mouth daily as needed for headache or moderate pain.     ipratropium-albuterol (DUONEB) 0.5-2.5 (3) MG/3ML SOLN Take 3 mLs by nebulization every 4 (four) hours as needed. Dx 496 120 mL 2   methocarbamol  (ROBAXIN) 500 MG tablet Take 500 mg by mouth 4 (four) times daily.     montelukast (SINGULAIR) 10 MG tablet TAKE 1 TABLET BY MOUTH EVERYDAY AT BEDTIME 90 tablet 1   Naphazoline-Pheniramine 0.027-0.315 % SOLN Place 1 drop into both eyes daily as needed (allergies).      ondansetron (ZOFRAN-ODT) 4 MG disintegrating tablet Take 1 tablet (4 mg total) by mouth every 6 (six) hours as needed for nausea or vomiting. 30 tablet 1   oxyCODONE (OXY IR/ROXICODONE) 5 MG immediate release tablet Take 5 mg by mouth 2 (two)  times daily as needed.     pantoprazole (PROTONIX) 40 MG tablet TAKE ONE TABLET EVERY DAY NEEDS FOR OFFICE USE VISIT FOR MORE REFILLS. 90 tablet 2   predniSONE (DELTASONE) 20 MG tablet Take 2 tablets (40 mg total) by mouth daily with breakfast. 10 tablet 0   Pseudoephedrine HCl (SUDAFED PO) Take 1 tablet by mouth daily as needed (cold symptons).     rizatriptan (MAXALT-MLT) 10 MG disintegrating tablet TAKE 1 TABLET (10 MG TOTAL) BY MOUTH DAILY AS NEEDED FOR MIGRAINE. MAY REPEAT FOR ONE DOSE 2 HOURS AFTER THE FIRST ONE IF NEEDED. 10 tablet 0   sodium chloride (OCEAN) 0.65 % SOLN nasal spray Place 1 spray into both nostrils every 4 (four) hours as needed for congestion.      traMADol (ULTRAM) 50 MG tablet 0.5-1 Tablet(s) By Mouth Twice Daily PRN     White Petrolatum-Mineral Oil (LUBRICANT EYE) OINT Place 1 application into both eyes 2 (two) times daily as needed (for dry eyes).      No current facility-administered medications for this visit.    Allergies as of 03/31/2022 - Review Complete 03/31/2022  Allergen Reaction Noted   Asa [aspirin] Shortness Of Breath, Swelling, and Other (See Comments) 08/05/2012   Bee venom Anaphylaxis and Shortness Of Breath 02/08/2017   Epinephrine Shortness Of Breath and Palpitations 10/23/2013   Feraheme [ferumoxytol] Shortness Of Breath 10/31/2017   Influenza vaccines Shortness Of Breath and Other (See Comments) 01/14/2013   Pfizer-biontech covid-19 vacc  [covid-19 mrna vacc (moderna)] Anaphylaxis 08/19/2019   Quinolones Swelling and Other (See Comments) 07/01/2013   Shellfish allergy Anaphylaxis 08/13/2012   Ciprofloxacin Swelling and Other (See Comments) 07/01/2013   Clarithromycin Nausea Only 10/23/2013   Erythromycin Nausea Only 10/23/2013   Levaquin [levofloxacin in d5w] Swelling and Other (See Comments) 08/05/2012   Peanut-containing drug products Other (See Comments) 01/04/2017   Septra [sulfamethoxazole-trimethoprim] Diarrhea 04/18/2013   Telithromycin Nausea Only 10/23/2013   Zanaflex [tizanidine] Other (See Comments) 02/08/2017   Adhesive [tape] Rash and Other (See Comments) 08/03/2016    Family History  Problem Relation Age of Onset   Asthma Cousin    COPD Cousin    Breast cancer Maternal Grandmother 23   Asthma Father    Kidney cancer Father    Arrhythmia Father    Lung cancer Father    Lung cancer Paternal Uncle    COPD Paternal Grandfather    Alcohol abuse Mother    Breast cancer Maternal Aunt 54   Bladder Cancer Neg Hx    Colon cancer Neg Hx    Esophageal cancer Neg Hx    Pancreatic cancer Neg Hx    Stomach cancer Neg Hx    Liver disease Neg Hx     Social History   Socioeconomic History   Marital status: Married    Spouse name: Not on file   Number of children: 4   Years of education: Not on file   Highest education level: Not on file  Occupational History   Not on file  Tobacco Use   Smoking status: Never   Smokeless tobacco: Never  Vaping Use   Vaping Use: Never used  Substance and Sexual Activity   Alcohol use: Yes    Comment: occ wine   Drug use: No   Sexual activity: Yes  Other Topics Concern   Not on file  Social History Narrative   Lives in [place] with husband.  She has four children.   Retired from [employer]  Social Determinants of Health   Financial Resource Strain: Low Risk  (02/02/2022)   Overall Financial Resource Strain (CARDIA)    Difficulty of Paying Living  Expenses: Not hard at all  Food Insecurity: No Food Insecurity (02/02/2022)   Hunger Vital Sign    Worried About Running Out of Food in the Last Year: Never true    Ran Out of Food in the Last Year: Never true  Transportation Needs: No Transportation Needs (02/02/2022)   PRAPARE - Hydrologist (Medical): No    Lack of Transportation (Non-Medical): No  Physical Activity: Sufficiently Active (02/02/2022)   Exercise Vital Sign    Days of Exercise per Week: 5 days    Minutes of Exercise per Session: 30 min  Stress: No Stress Concern Present (02/02/2022)   [place]    Feeling of Stress : Not at all  Social Connections: Unknown (02/02/2022)   Social Connection and Isolation Panel [NHANES]    Frequency of Communication with Friends and Family: More than three times a week    Frequency of Social Gatherings with Friends and Family: More than three times a week    Attends Religious Services: Not on file    Active Member of Clubs or Organizations: Yes    Attends Archivist Meetings: More than 4 times per year    Marital Status: Married  Human resources officer Violence: Not At Risk (02/02/2022)   Humiliation, Afraid, Rape, and Kick questionnaire    Fear of Current or Ex-Partner: No    Emotionally Abused: No    Physically Abused: No    Sexually Abused: No    Review of Systems:    Constitutional: No weight loss, fever or chills Cardiovascular: No chest pain Respiratory: No SOB  Gastrointestinal: See HPI and otherwise negative   Physical Exam:  Vital signs: BP 106/60   Pulse 63   Ht '5\' 3"'$  (1.6 m)   Wt 102 lb 2 oz (46.3 kg)   BMI 18.09 kg/m   Constitutional:   Pleasant thin appearing elderly Caucasian female appears to be in NAD, Well developed, Well nourished, alert and cooperative Respiratory: Respirations even and unlabored. Lungs clear to auscultation bilaterally.   No wheezes, crackles,  or rhonchi.  Cardiovascular: Normal S1, S2. No MRG. Regular rate and rhythm. No peripheral edema, cyanosis or pallor.  Gastrointestinal:  Soft, nondistended, moderate epigastric ttp with some involuntary guarding. Normal bowel sounds. No appreciable masses or hepatomegaly. Rectal:  Not performed.  Psychiatric: Demonstrates good judgement and reason without abnormal affect or behaviors.  RELEVANT LABS AND IMAGING: CBC    Component Value Date/Time   WBC 4.6 09/06/2021 0856   RBC 3.78 (L) 09/06/2021 0856   HGB 12.3 09/06/2021 0856   HGB 12.6 02/17/2014 1303   HCT 36.8 09/06/2021 0856   HCT 37.9 02/17/2014 1303   PLT 175.0 09/06/2021 0856   PLT 208 02/17/2014 1303   MCV 97.5 09/06/2021 0856   MCV 97 02/17/2014 1303   MCH 32.8 10/27/2020 1139   MCHC 33.5 09/06/2021 0856   RDW 12.7 09/06/2021 0856   RDW 12.5 02/17/2014 1303   LYMPHSABS 1.3 10/27/2020 1139   LYMPHSABS 1.2 02/17/2014 1303   MONOABS 0.6 10/27/2020 1139   MONOABS 0.3 02/17/2014 1303   EOSABS 0.2 10/27/2020 1139   EOSABS 0.2 02/17/2014 1303   BASOSABS 0.0 10/27/2020 1139   BASOSABS 0.0 02/17/2014 1303    CMP     Component  Value Date/Time   NA 141 09/06/2021 0856   K 3.8 09/06/2021 0856   K 3.6 04/03/2012 1653   CL 105 09/06/2021 0856   CO2 30 09/06/2021 0856   GLUCOSE 76 09/06/2021 0856   BUN 15 09/06/2021 0856   CREATININE 1.02 09/06/2021 0856   CREATININE 0.94 10/30/2016 1135   CALCIUM 9.5 09/06/2021 0856   PROT 6.3 09/06/2021 0856   ALBUMIN 4.0 09/06/2021 0856   AST 16 09/06/2021 0856   ALT 10 09/06/2021 0856   ALKPHOS 99 09/06/2021 0856   BILITOT 0.5 09/06/2021 0856   GFRNONAA >60 10/27/2020 1139   GFRAA >60 05/07/2018 1327    Assessment: 1.  GERD: Chronically on Pantoprazole 40 mg daily but now with increased epigastric pain; likely gastritis 2.  Esophageal dysphagia with hypertensive LES: Previous response to Botox x 4 so far 3.  History of CVA: No longer on Plavix 4.  Epigastric pain: Likely  with GERD/gastritis as above  Plan: 1.  Scheduled patient for repeat EGD with Botox injections at the hospital Dr. Hilarie Fredrickson.  Did provide the patient a detailed list risks for the procedure and she agrees to proceed. 2.  Increased Pantoprazole to 40 mg twice daily, 30-60 minutes before breakfast and dinner.  #60 with 3 refills. 3.  Patient to follow in clinic per recommendations from Dr. Hilarie Fredrickson after time of procedure.  Ellouise Newer, PA-C [facility] 03/31/2022, 10:43 AM  Cc: [name], MD

## 2022-04-04 ENCOUNTER — Ambulatory Visit: Payer: Medicare Other | Admitting: Family Medicine

## 2022-04-04 ENCOUNTER — Telehealth: Payer: Self-pay

## 2022-04-04 NOTE — Telephone Encounter (Signed)
I called the patient and informed her that this appointment scheduled to see the provider is not needed unless she needed anything because she was just seen 3 weeks ago.  Patient stated that was fine she just want the provider to look at her echocardiogram and let her know if she needs to do anything and Arnett gave her medication for arthritis and it made her sick, she has been using tylenol arthritis and it is not helping her, she wants to know is there anything else she can take.  That is what she saw Arnette about, I can send the message about the arthritis to margaret if you like.  Please advise.  Osker Ayoub,cma

## 2022-04-04 NOTE — Telephone Encounter (Signed)
Her echo is overall reassuring.  I do not think there is anything necessarily to be done with any of the findings.  She can take Tylenol over-the-counter for arthritis.  She can occasionally take anti-inflammatories such as ibuprofen or Aleve over-the-counter or could take tramadol occasionally if her arthritis pain is excessive.  Is she currently taking oxycodone and tramadol?  They are both on her list.  Thanks.

## 2022-04-04 NOTE — Telephone Encounter (Signed)
I called and spoke with the patient and informed her to take the tylenol arthritis, she stated that she has been taking the tylenol only 1/2 a tablet but she would try the whole tablet.  She has oxycodone but it is xpired and she does not like taking that.  But I also informed her the echo was nothing concerning and she understood.  Pietra Zuluaga,cma

## 2022-04-06 ENCOUNTER — Encounter (HOSPITAL_COMMUNITY)
Admission: RE | Disposition: A | Discharge status: Home / Self Care | Payer: Self-pay | Source: Ambulatory Visit | Attending: Internal Medicine

## 2022-04-06 ENCOUNTER — Other Ambulatory Visit: Payer: Self-pay

## 2022-04-06 ENCOUNTER — Ambulatory Visit (HOSPITAL_COMMUNITY)
Admission: RE | Admit: 2022-04-06 | Discharge: 2022-04-06 | Disposition: A | Discharge status: Home / Self Care | Payer: Medicare Other | Source: Ambulatory Visit | Attending: Internal Medicine | Admitting: Internal Medicine

## 2022-04-06 ENCOUNTER — Ambulatory Visit (HOSPITAL_BASED_OUTPATIENT_CLINIC_OR_DEPARTMENT_OTHER): Payer: Medicare Other | Admitting: Certified Registered Nurse Anesthetist

## 2022-04-06 ENCOUNTER — Ambulatory Visit (HOSPITAL_COMMUNITY): Payer: Medicare Other | Admitting: Certified Registered Nurse Anesthetist

## 2022-04-06 ENCOUNTER — Encounter (HOSPITAL_COMMUNITY): Payer: Self-pay | Admitting: Internal Medicine

## 2022-04-06 DIAGNOSIS — F419 Anxiety disorder, unspecified: Secondary | ICD-10-CM

## 2022-04-06 DIAGNOSIS — K224 Dyskinesia of esophagus: Secondary | ICD-10-CM | POA: Diagnosis not present

## 2022-04-06 DIAGNOSIS — Z79899 Other long term (current) drug therapy: Secondary | ICD-10-CM | POA: Diagnosis not present

## 2022-04-06 DIAGNOSIS — K2970 Gastritis, unspecified, without bleeding: Secondary | ICD-10-CM | POA: Diagnosis not present

## 2022-04-06 DIAGNOSIS — Z8669 Personal history of other diseases of the nervous system and sense organs: Secondary | ICD-10-CM | POA: Diagnosis not present

## 2022-04-06 DIAGNOSIS — I10 Essential (primary) hypertension: Secondary | ICD-10-CM | POA: Diagnosis not present

## 2022-04-06 DIAGNOSIS — J449 Chronic obstructive pulmonary disease, unspecified: Secondary | ICD-10-CM

## 2022-04-06 DIAGNOSIS — K2289 Other specified disease of esophagus: Secondary | ICD-10-CM

## 2022-04-06 DIAGNOSIS — R1110 Vomiting, unspecified: Secondary | ICD-10-CM

## 2022-04-06 DIAGNOSIS — K3189 Other diseases of stomach and duodenum: Secondary | ICD-10-CM | POA: Insufficient documentation

## 2022-04-06 DIAGNOSIS — R1013 Epigastric pain: Secondary | ICD-10-CM | POA: Diagnosis present

## 2022-04-06 DIAGNOSIS — R1310 Dysphagia, unspecified: Secondary | ICD-10-CM | POA: Diagnosis not present

## 2022-04-06 DIAGNOSIS — K589 Irritable bowel syndrome without diarrhea: Secondary | ICD-10-CM | POA: Insufficient documentation

## 2022-04-06 DIAGNOSIS — Z8673 Personal history of transient ischemic attack (TIA), and cerebral infarction without residual deficits: Secondary | ICD-10-CM | POA: Insufficient documentation

## 2022-04-06 HISTORY — PX: ESOPHAGOGASTRODUODENOSCOPY (EGD) WITH PROPOFOL: SHX5813

## 2022-04-06 HISTORY — PX: BIOPSY: SHX5522

## 2022-04-06 HISTORY — PX: BOTOX INJECTION: SHX5754

## 2022-04-06 SURGERY — ESOPHAGOGASTRODUODENOSCOPY (EGD) WITH PROPOFOL
Anesthesia: Monitor Anesthesia Care

## 2022-04-06 MED ORDER — PROPOFOL 10 MG/ML IV BOLUS
INTRAVENOUS | Status: DC | PRN
  Administered 2022-04-06 (×2): 25 mg via INTRAVENOUS

## 2022-04-06 MED ORDER — AMISULPRIDE (ANTIEMETIC) 5 MG/2ML IV SOLN
INTRAVENOUS | Status: DC | PRN
  Administered 2022-04-06 (×2): 5 mg via INTRAVENOUS

## 2022-04-06 MED ORDER — LIDOCAINE 2% (20 MG/ML) 5 ML SYRINGE
INTRAMUSCULAR | Status: DC | PRN
  Administered 2022-04-06: 40 mg via INTRAVENOUS

## 2022-04-06 MED ORDER — SODIUM CHLORIDE 0.9 % IV SOLN: INTRAVENOUS | Status: DC

## 2022-04-06 MED ORDER — IPRATROPIUM-ALBUTEROL 0.5-2.5 (3) MG/3ML IN SOLN
3.0000 mL | Freq: Once | RESPIRATORY_TRACT | Status: AC
  Administered 2022-04-06: 3 mL via RESPIRATORY_TRACT

## 2022-04-06 MED ORDER — RACEPINEPHRINE HCL 2.25 % IN NEBU
INHALATION_SOLUTION | RESPIRATORY_TRACT | Status: AC
  Filled 2022-04-06: qty 0.5

## 2022-04-06 MED ORDER — SODIUM CHLORIDE (PF) 0.9 % IJ SOLN
INTRAMUSCULAR | Status: DC | PRN
  Administered 2022-04-06: 4 mL via SUBMUCOSAL

## 2022-04-06 MED ORDER — PROPOFOL 500 MG/50ML IV EMUL
INTRAVENOUS | Status: DC | PRN
  Administered 2022-04-06: 150 ug/kg/min via INTRAVENOUS

## 2022-04-06 MED ORDER — LACTATED RINGERS IV SOLN: INTRAVENOUS | Status: DC | PRN

## 2022-04-06 MED ORDER — SODIUM CHLORIDE (PF) 0.9 % IJ SOLN
100.0000 [IU] | Freq: Once | INTRAMUSCULAR | Status: DC
  Filled 2022-04-06: qty 100

## 2022-04-06 MED ORDER — RACEPINEPHRINE HCL 2.25 % IN NEBU
0.5000 mL | INHALATION_SOLUTION | Freq: Once | RESPIRATORY_TRACT | Status: AC
  Administered 2022-04-06: 0.5 mL via RESPIRATORY_TRACT
  Filled 2022-04-06: qty 0.5

## 2022-04-06 MED ORDER — AMISULPRIDE (ANTIEMETIC) 5 MG/2ML IV SOLN
INTRAVENOUS | Status: AC
  Filled 2022-04-06: qty 2

## 2022-04-06 MED ORDER — IPRATROPIUM-ALBUTEROL 0.5-2.5 (3) MG/3ML IN SOLN
RESPIRATORY_TRACT | Status: AC
  Filled 2022-04-06: qty 3

## 2022-04-06 SURGICAL SUPPLY — 15 items

## 2022-04-06 NOTE — Anesthesia Preprocedure Evaluation (Signed)
Anesthesia Evaluation  Patient identified by MRN, date of birth, ID band Patient awake    Reviewed: Allergy & Precautions, NPO status , Patient's Chart, lab work & pertinent test results  Airway Mallampati: I  TM Distance: >3 FB Neck ROM: Full    Dental  (+) Partial Upper, Partial Lower   Pulmonary asthma , COPD    + decreased breath sounds      Cardiovascular + Valvular Problems/Murmurs  Rhythm:Regular Rate:Normal     Neuro/Psych  Headaches, Seizures -,   Anxiety     CVA    GI/Hepatic Neg liver ROS, hiatal hernia,,,  Endo/Other  negative endocrine ROS    Renal/GU Renal disease     Musculoskeletal  (+) Arthritis ,    Abdominal   Peds  Hematology negative hematology ROS (+)   Anesthesia Other Findings   Reproductive/Obstetrics                             Anesthesia Physical Anesthesia Plan  ASA: 3  Anesthesia Plan: MAC   Post-op Pain Management: Minimal or no pain anticipated   Induction: Intravenous  PONV Risk Score and Plan: 0 and Propofol infusion  Airway Management Planned: Nasal Cannula and Natural Airway  Additional Equipment: None  Intra-op Plan:   Post-operative Plan:   Informed Consent: I have reviewed the patients History and Physical, chart, labs and discussed the procedure including the risks, benefits and alternatives for the proposed anesthesia with the patient or authorized representative who has indicated his/her understanding and acceptance.       Plan Discussed with: CRNA  Anesthesia Plan Comments:        Anesthesia Quick Evaluation

## 2022-04-06 NOTE — Interval H&P Note (Signed)
History and Physical Interval Note: Pt presenting for EGD and possible BOTOX to LES for hx of dysphagia and know hypertensive LES. Also intermittent abd pain, nausea and vomiting. The nature of the procedure, as well as the risks, benefits, and alternatives were carefully and thoroughly reviewed with the patient. Ample time for discussion and questions allowed. The patient understood, was satisfied, and agreed to proceed.   04/06/2022 9:47 AM  Anne [name]  has presented today for surgery, with the diagnosis of hypertensive LES, Vomiting, epigastric pain.  The various methods of treatment have been discussed with the patient and family. After consideration of risks, benefits and other options for treatment, the patient has consented to  Procedure(s): ESOPHAGOGASTRODUODENOSCOPY (EGD) WITH PROPOFOL (N/A) BOTOX INJECTION (N/A) as a surgical intervention.  The patient's history has been reviewed, patient examined, no change in status, stable for surgery.  I have reviewed the patient's chart and labs.  Questions were answered to the patient's satisfaction.     Lajuan Lines Zarek Relph

## 2022-04-06 NOTE — Anesthesia Postprocedure Evaluation (Addendum)
Anesthesia Post Note  Patient: LORALYN RACHEL  Procedure(s) Performed: ESOPHAGOGASTRODUODENOSCOPY (EGD) WITH PROPOFOL BOTOX INJECTION     Patient location during evaluation: PACU Anesthesia Type: MAC Level of consciousness: awake and alert Pain management: pain level controlled Vital Signs Assessment: post-procedure vital signs reviewed and stable Respiratory status: spontaneous breathing, nonlabored ventilation, respiratory function stable and patient connected to nasal cannula oxygen Cardiovascular status: stable and blood pressure returned to baseline Postop Assessment: no apparent nausea or vomiting Anesthetic complications: no Comments: Pt displaying symptoms of upper airway narrowing in recovery. Reassurance and calming the patient helped significantly, prophylactic racemic epi administered for patient comfort prior to discharge.  No notable events documented.  Last Vitals:  Vitals:   04/06/22 1100 04/06/22 1115  BP: 112/71 119/67  Pulse: 70 68  Resp: 14 11  Temp:    SpO2: 100% 100%    Last Pain:  Vitals:   04/06/22 1100  TempSrc:   PainSc: 0-No pain                 [name]

## 2022-04-06 NOTE — Transfer of Care (Signed)
Immediate Anesthesia Transfer of Care Note  Patient: Anne [name]  Procedure(s) Performed: ESOPHAGOGASTRODUODENOSCOPY (EGD) WITH PROPOFOL BOTOX INJECTION  Patient Location: PACU  Anesthesia Type:MAC  Level of Consciousness: drowsy and patient cooperative  Airway & Oxygen Therapy: Patient Spontanous Breathing and Patient connected to nasal cannula oxygen  Post-op Assessment: Report given to RN and Post -op Vital signs reviewed and stable  Post vital signs: Reviewed and stable  Last Vitals:  Vitals Value Taken Time  BP 102/61 04/06/22 1031  Temp    Pulse 73 04/06/22 1033  Resp 18 04/06/22 1033  SpO2 100 % 04/06/22 1033  Vitals shown include unvalidated device data.  Last Pain:  Vitals:   04/06/22 0919  TempSrc: Temporal  PainSc: 0-No pain      Patients Stated Pain Goal: 3 (54/56/25 6389)  Complications: No notable events documented.

## 2022-04-06 NOTE — Op Note (Signed)
[facility] Patient Name: Anne [name] Procedure Date : 04/06/2022 MRN: [id number] Attending MD: Jerene Bears , MD, [id number] Date of Birth: 09/02/1950 CSN: [id number] Age: 72 Admit Type: Inpatient Procedure:                Upper GI endoscopy Indications:              Epigastric abdominal pain, Dysphagia, Nausea with                            vomiting, hx of hypertensive LES (responsive to                            BOTOX in the past, last March 2023) Providers:                Lajuan Lines. Hilarie Fredrickson, MD, Katina Degree, RN, William Dalton, Technician Referring MD:             Angela Adam. Sonnenberg Medicines:                Monitored Anesthesia Care Complications:            No immediate complications. Estimated Blood Loss:     Estimated blood loss was minimal. Procedure:                Pre-Anesthesia Assessment:                           - Prior to the procedure, a History and Physical                            was performed, and patient medications and                            allergies were reviewed. The patient's tolerance of                            previous anesthesia was also reviewed. The risks                            and benefits of the procedure and the sedation                            options and risks were discussed with the patient.                            All questions were answered, and informed consent                            was obtained. Prior Anticoagulants: The patient has                            taken no anticoagulant or antiplatelet agents. ASA  Grade Assessment: III - A patient with severe                            systemic disease. After reviewing the risks and                            benefits, the patient was deemed in satisfactory                            condition to undergo the procedure.                           After obtaining informed consent, the endoscope was                             passed under direct vision. Throughout the                            procedure, the patient's blood pressure, pulse, and                            oxygen saturations were monitored continuously. The                            [id number]-H190 ([id number]) Olympus endoscope was introduced                            through the mouth, and advanced to the second part                            of duodenum. The upper GI endoscopy was                            accomplished without difficulty. The patient                            tolerated the procedure well. Scope In: Scope Out: Findings:      The examined esophagus was normal.      The lower esophageal sphincter was found and endoscopically did not       appear hypertonic today. 4 quadrant injection (25 U each injection)       performed with botulinum toxin (total 100 units).      Diffuse mildly erythematous mucosa without bleeding was found in the       gastric antrum. Biopsies were taken with a cold forceps for histology       and Helicobacter pylori testing.      The examined duodenum was normal. Impression:               - Normal esophagus.                           - Lower esophageal sphincter injected with                            botulinum toxin (100 Units  total).                           - Erythematous mucosa in the antrum. Biopsied to                            exclude H. Pylori infection.                           - Normal examined duodenum. Moderate Sedation:      N/A Recommendation:           - Patient has a contact number available for                            emergencies. The signs and symptoms of potential                            delayed complications were discussed with the                            patient. Return to normal activities tomorrow.                            Written discharge instructions were provided to the                            patient.                           - Resume previous  diet.                           - Continue present medications. Would continue                            pantoprazole 40 mg twice daily for next 1 month. If                            symptoms improve reduce back to once daily. If                            symptoms do not improve with Botox please call for                            an office visit with me.                           - Await pathology results.                           - Repeat upper endoscopy PRN for retreatment. Procedure Code(s):        --- Professional ---                           [phone], Esophagogastroduodenoscopy, flexible,  transoral; with biopsy, single or multiple                           [id number], 59, Esophagogastroduodenoscopy, flexible,                            transoral; with directed submucosal injection(s),                            any substance Diagnosis Code(s):        --- Professional ---                           K22.89, Other specified disease of esophagus                           K31.89, Other diseases of stomach and duodenum                           R10.13, Epigastric pain                           R13.10, Dysphagia, unspecified                           R11.2, Nausea with vomiting, unspecified CPT copyright 2022 [employer]. All rights reserved. The codes documented in this report are preliminary and upon coder review may  be revised to meet current compliance requirements. Jerene Bears, MD 04/06/2022 10:40:10 AM This report has been signed electronically. Number of Addenda: 0

## 2022-04-06 NOTE — Addendum Note (Signed)
Addendum  created 04/06/22 1233 by [name], MD   Clinical Note Signed

## 2022-04-06 NOTE — Anesthesia Procedure Notes (Signed)
Procedure Name: MAC Date/Time: 04/06/2022 10:11 AM  Performed by: Colin Benton, CRNAPre-anesthesia Checklist: Patient identified, Emergency Drugs available, Patient being monitored and Suction available Patient Re-evaluated:Patient Re-evaluated prior to induction Oxygen Delivery Method: Nasal cannula Induction Type: IV induction Airway Equipment and Method: Bite block Placement Confirmation: positive ETCO2 Dental Injury: Teeth and Oropharynx as per pre-operative assessment

## 2022-04-07 ENCOUNTER — Other Ambulatory Visit: Payer: Self-pay | Admitting: Family Medicine

## 2022-04-08 ENCOUNTER — Encounter (HOSPITAL_COMMUNITY): Payer: Self-pay | Admitting: Internal Medicine

## 2022-04-10 ENCOUNTER — Telehealth: Payer: Self-pay | Admitting: Pulmonary Disease

## 2022-04-10 LAB — SURGICAL PATHOLOGY

## 2022-04-10 NOTE — Telephone Encounter (Signed)
I recommend urgent care or ED.  She will need chest x-ray and test for RSV and flu.

## 2022-04-10 NOTE — Telephone Encounter (Signed)
I notified the patient. Nothing further needed. 

## 2022-04-10 NOTE — Telephone Encounter (Signed)
I spoke with the patient. She started feeling bad Thursday night and has not got any better. No fevers, but she does have chills. She said it was all in her head and now it is moving down. Cough with yellow sputum Nose bleeds. Sore throat. Has tried OTC cough syrup. Using her nebulizer and Trelegy  Has tested for Covid and it was negative.

## 2022-04-11 ENCOUNTER — Encounter: Payer: Self-pay | Admitting: Internal Medicine

## 2022-04-11 ENCOUNTER — Ambulatory Visit (INDEPENDENT_AMBULATORY_CARE_PROVIDER_SITE_OTHER): Payer: Medicare Other | Admitting: Nurse Practitioner

## 2022-04-11 ENCOUNTER — Encounter: Payer: Self-pay | Admitting: Nurse Practitioner

## 2022-04-11 VITALS — BP 112/72 | HR 81 | Temp 99.7°F | Ht 63.0 in | Wt 102.6 lb

## 2022-04-11 DIAGNOSIS — J4531 Mild persistent asthma with (acute) exacerbation: Secondary | ICD-10-CM

## 2022-04-11 DIAGNOSIS — R6889 Other general symptoms and signs: Secondary | ICD-10-CM

## 2022-04-11 DIAGNOSIS — U071 COVID-19: Secondary | ICD-10-CM | POA: Diagnosis not present

## 2022-04-11 DIAGNOSIS — Z1152 Encounter for screening for COVID-19: Secondary | ICD-10-CM | POA: Diagnosis not present

## 2022-04-11 LAB — POCT INFLUENZA A/B
Influenza A, POC: NEGATIVE
Influenza B, POC: NEGATIVE

## 2022-04-11 LAB — POC COVID19 BINAXNOW: SARS Coronavirus 2 Ag: POSITIVE — AB

## 2022-04-11 MED ORDER — PREDNISONE 20 MG PO TABS: 40.0000 mg | ORAL_TABLET | Freq: Every day | ORAL | 0 refills | Status: DC

## 2022-04-11 NOTE — Progress Notes (Signed)
Anne Morrow, NP-C Phone: [phone]  Anne [name] is a 72 y.o. female who presents today for cough and congestion since Thursday.   Respiratory illness:  Cough- Yes  Congestion-    Sinus- Yes   Chest- Yes  Post nasal drip- Yes  Sore throat- Yes  Shortness of breath- Yes, tightness and wheezing  Fever- No  Fatigue/Myalgia- Yes Headache- Yes Nausea/Vomiting- No Taste disturbance- No  Smell disturbance- No  Covid vaccination- x 3  Flu vaccination- Yes  Medications- Robitussin, Nyquil, Coricidin   Social History   Tobacco Use  Smoking Status Never  Smokeless Tobacco Never    Current Outpatient Medications on File Prior to Visit  Medication Sig Dispense Refill   acetaminophen (TYLENOL) 500 MG tablet Take 1,000 mg by mouth 2 (two) times daily as needed for moderate pain or headache.     albuterol (VENTOLIN HFA) 108 (90 Base) MCG/ACT inhaler Inhale 2 puffs into the lungs every 6 (six) hours as needed for wheezing or shortness of breath. 18 g 3   azelastine (ASTELIN) 0.1 % nasal spray Place 1 spray into both nostrils 2 (two) times daily. Use in each nostril as directed (Patient taking differently: Place 1 spray into both nostrils 2 (two) times daily as needed for allergies. Use in each nostril as directed) 30 mL 6   cetirizine (ZYRTEC) 10 MG tablet TAKE 1 TABLET BY MOUTH EVERY DAY (NOT COVERED 90 tablet 3   Cyanocobalamin (VITAMIN B-12 IJ) Inject 1 Dose as directed every 30 (thirty) days.     diphenhydrAMINE (BENADRYL) 25 MG tablet Take 50 mg by mouth daily as needed for allergies (Asthma).     EPINEPHrine 0.3 mg/0.3 mL IJ SOAJ injection Inject 0.3 mg into the muscle as needed for anaphylaxis.      estradiol (ESTRACE) 1 MG tablet TAKE 1 TABLET BY MOUTH EVERY DAY 90 tablet 1   Fluticasone-Umeclidin-Vilant (TRELEGY ELLIPTA) 100-62.5-25 MCG/ACT AEPB Inhale 1 puff into the lungs daily. (Patient taking differently: Inhale 1 puff into the lungs daily as needed (asthma).) 60 each 0    furosemide (LASIX) 20 MG tablet TAKE 1 TABLET BY MOUTH EVERY DAY AS NEEDED (Patient taking differently: Take 20 mg by mouth daily.) 90 tablet 1   ibuprofen (ADVIL,MOTRIN) 200 MG tablet Take 400-800 mg by mouth daily as needed for headache or moderate pain.     ipratropium-albuterol (DUONEB) 0.5-2.5 (3) MG/3ML SOLN Take 3 mLs by nebulization every 4 (four) hours as needed. Dx 496 120 mL 2   methocarbamol (ROBAXIN) 500 MG tablet Take 500 mg by mouth every 8 (eight) hours as needed for muscle spasms.     montelukast (SINGULAIR) 10 MG tablet TAKE 1 TABLET BY MOUTH EVERYDAY AT BEDTIME 90 tablet 1   ondansetron (ZOFRAN-ODT) 4 MG disintegrating tablet Take 1 tablet (4 mg total) by mouth every 6 (six) hours as needed for nausea or vomiting. 30 tablet 1   oxyCODONE (OXY IR/ROXICODONE) 5 MG immediate release tablet Take 5 mg by mouth 2 (two) times daily as needed for severe pain.     pantoprazole (PROTONIX) 40 MG tablet Take 1 tablet (40 mg total) by mouth 2 (two) times daily. 180 tablet 0   predniSONE (DELTASONE) 20 MG tablet Take 2 tablets (40 mg total) by mouth daily with breakfast. (Patient taking differently: Take 40 mg by mouth daily as needed (asthma).) 10 tablet 0   Pseudoephedrine HCl (SUDAFED PO) Take 1 tablet by mouth daily as needed (cold symptons).     rizatriptan (  MAXALT-MLT) 10 MG disintegrating tablet TAKE 1 TABLET (10 MG TOTAL) BY MOUTH DAILY AS NEEDED FOR MIGRAINE. MAY REPEAT FOR ONE DOSE 2 HOURS AFTER THE FIRST ONE IF NEEDED. 10 tablet 0   sodium chloride (OCEAN) 0.65 % SOLN nasal spray Place 1 spray into both nostrils every 4 (four) hours as needed for congestion.      traMADol (ULTRAM) 50 MG tablet 0.5-1 Tablet(s) By Mouth Twice Daily PRN     White Petrolatum-Mineral Oil (LUBRICANT EYE) OINT Place 1 application into both eyes 2 (two) times daily as needed (for dry eyes).      [DISCONTINUED] zolmitriptan (ZOMIG-ZMT) 5 MG disintegrating tablet Take 1 tablet (5 mg total) by mouth daily as  needed for migraine. 10 tablet 0   No current facility-administered medications on file prior to visit.     ROS see history of present illness  Objective  Physical Exam Vitals:   04/11/22 0815  BP: 112/72  Pulse: 81  Temp: 99.7 F (37.6 C)  SpO2: 100%    BP Readings from Last 3 Encounters:  04/11/22 112/72  04/06/22 119/67  03/31/22 106/60   Wt Readings from Last 3 Encounters:  04/11/22 102 lb 9.6 oz (46.5 kg)  03/31/22 102 lb 2 oz (46.3 kg)  03/16/22 100 lb (45.4 kg)    Physical Exam Constitutional:      General: She is not in acute distress.    Appearance: Normal appearance.  HENT:     Head: Normocephalic.     Right Ear: Tympanic membrane normal.     Left Ear: Tympanic membrane normal.     Nose: Nose normal.     Mouth/Throat:     Mouth: Mucous membranes are moist.     Pharynx: Oropharynx is clear.  Eyes:     Conjunctiva/sclera: Conjunctivae normal.     Pupils: Pupils are equal, round, and reactive to light.  Cardiovascular:     Rate and Rhythm: Normal rate and regular rhythm.     Heart sounds: Normal heart sounds.  Pulmonary:     Effort: Pulmonary effort is normal.     Breath sounds: Wheezing (global- inspiratory and expiratory.) present.  Neurological:     Mental Status: She is alert.    Assessment/Plan: Please see individual problem list.  COVID-19 Assessment & Plan: Test in office positive. Outside of window for antiviral treatment. Encouraged adequate fluid intake and rest. Strict return precautions given to patient.   Orders: -     predniSONE; Take 2 tablets (40 mg total) by mouth daily.  Dispense: 10 tablet; Refill: 0  Mild persistent asthma with acute exacerbation Assessment & Plan: Chronic issue. Exacerbation today likely due to COVID. Will treat with prednisone '40mg'$  daily x 5 days. Instructed patient to use nebulizer every 6 hours for at least 2 days, then can return to as needed. Strict return precautions given to patient and advised to  seek medical attention if worsening shortness of breath or trouble breathing.    Flu-like symptoms -     POCT Influenza A/B  Encounter for screening for COVID-19 -     POC COVID-19 BinaxNow    Return if symptoms worsen or fail to improve.   Anne Morrow, NP-C [place]

## 2022-04-11 NOTE — Assessment & Plan Note (Signed)
Test in office positive. Outside of window for antiviral treatment. Encouraged adequate fluid intake and rest. Strict return precautions given to patient.

## 2022-04-11 NOTE — Assessment & Plan Note (Signed)
Chronic issue. Exacerbation today likely due to COVID. Will treat with prednisone '40mg'$  daily x 5 days. Instructed patient to use nebulizer every 6 hours for at least 2 days, then can return to as needed. Strict return precautions given to patient and advised to seek medical attention if worsening shortness of breath or trouble breathing.

## 2022-04-13 ENCOUNTER — Other Ambulatory Visit: Payer: Self-pay | Admitting: Nurse Practitioner

## 2022-04-13 ENCOUNTER — Telehealth: Payer: Self-pay | Admitting: Family Medicine

## 2022-04-13 DIAGNOSIS — U071 COVID-19: Secondary | ICD-10-CM

## 2022-04-13 MED ORDER — BENZONATATE 200 MG PO CAPS: 200.0000 mg | ORAL_CAPSULE | Freq: Three times a day (TID) | ORAL | 0 refills | Status: DC | PRN

## 2022-04-13 NOTE — Telephone Encounter (Signed)
Patient saw Ollen Gross this week. Ollen Gross was suppose to call in a cough medication and it was never called in. Please call in some cough medication.

## 2022-04-13 NOTE — Telephone Encounter (Signed)
Pt has been informed.

## 2022-04-17 ENCOUNTER — Ambulatory Visit (INDEPENDENT_AMBULATORY_CARE_PROVIDER_SITE_OTHER): Payer: Medicare Other

## 2022-04-17 DIAGNOSIS — E538 Deficiency of other specified B group vitamins: Secondary | ICD-10-CM | POA: Diagnosis not present

## 2022-04-17 MED ORDER — CYANOCOBALAMIN 1000 MCG/ML IJ SOLN
1000.0000 ug | Freq: Once | INTRAMUSCULAR | Status: AC
  Administered 2022-04-17: 1000 ug via INTRAMUSCULAR

## 2022-04-17 NOTE — Progress Notes (Signed)
Pt presented for their vitamin B12 injection. Pt was identified through two identifiers. Pt tolerated shot well in their left  deltoid.  

## 2022-04-18 ENCOUNTER — Telehealth (INDEPENDENT_AMBULATORY_CARE_PROVIDER_SITE_OTHER): Payer: Medicare Other | Admitting: Family Medicine

## 2022-04-18 ENCOUNTER — Telehealth: Payer: Self-pay | Admitting: Family Medicine

## 2022-04-18 DIAGNOSIS — U071 COVID-19: Secondary | ICD-10-CM

## 2022-04-18 MED ORDER — AMOXICILLIN-POT CLAVULANATE 875-125 MG PO TABS: 1.0000 | ORAL_TABLET | Freq: Two times a day (BID) | ORAL | 0 refills | Status: DC

## 2022-04-18 NOTE — Telephone Encounter (Signed)
I called the patient to inform her she would need a visit for antibiotics. Ethal Gotay,cma

## 2022-04-18 NOTE — Telephone Encounter (Signed)
Pt called in staying that she's having a sinus infection, trouble breathing, cough, nasal congestion,headache etc. She would would like to know if she can get some antiobicts or what she can do to treat these? She's available [phone].

## 2022-04-18 NOTE — Progress Notes (Signed)
Virtual Visit via Video Note  I connected with Anne [name]  on 04/18/22 at  4:40 PM EST by a video enabled telemedicine application and verified that I am speaking with the correct person using two identifiers.  Location patient: [place] [facility] Location provider:work or home office Persons participating in the virtual visit: patient, provider  I discussed the limitations and requested verbal permission for telemedicine visit. The patient expressed understanding and agreed to proceed.   HPI:  Acute telemedicine visit for Covid19: -Onset: started about 1.5-2 weeks ago, she did take some prednisone for asthma symptoms and the breathing and asthma symptoms are much better, she now has lingering/worsening sinus congestion/some sinus discomfort and pressure, more pressure than dizziness per her report when I asked about dizziness that was in nurse notes, she feels like has turned into a sinus infection, mucus is a bit discolored -reports she can take augmentin fine, reports this feels like sinus infection she has had in the past -Denies:fever, CP, SOB, vomiting -Pertinent past medical history: see below -Pertinent medication allergies: Allergies  Allergen Reactions   Asa [Aspirin] Shortness Of Breath, Swelling and Other (See Comments)    Tongue swells   Bee Venom Anaphylaxis and Shortness Of Breath   Epinephrine Shortness Of Breath and Palpitations    Occurs when pt is given double the dose, pt would use epipen as needed    Feraheme [Ferumoxytol] Shortness Of Breath    Light headed    Influenza Vaccines Shortness Of Breath and Other (See Comments)    Can take flu vaccines without eggs    Pfizer-Biontech Covid-19 Vacc [Covid-19 Mrna Vacc (Moderna)] Anaphylaxis   Quinolones Swelling and Other (See Comments)    Feet swell   Shellfish Allergy Anaphylaxis   Ciprofloxacin Swelling and Other (See Comments)    ALL MEDS ENDING IN -FLOXACIN MAKE THE FEET SWELL   Clarithromycin Nausea Only    Abdomina pain    Erythromycin Nausea Only    Abdominal pain   Levaquin [Levofloxacin In D5w] Swelling and Other (See Comments)    Feet and legs ache and SWELL   Peanut-Containing Drug Products Other (See Comments)    Wheezing (boiled or raw peanuts)   Septra [Sulfamethoxazole-Trimethoprim] Diarrhea   Telithromycin Nausea Only    Abdominal pain   Zanaflex [Tizanidine] Other (See Comments)    Caused the patient to feel "spaced out" and "not well"   Adhesive [Tape] Rash and Other (See Comments)    Paper works tape works fine    -COVID-19 vaccine status:  Immunization History  Administered Date(s) Administered   Fluad Quad(high Dose 65+) 02/14/2021   Influenza Inj Mdck Quad Pf 01/15/2019, 01/16/2020, 12/15/2021   Influenza, Quadrivalent, Recombinant, Inj, Pf 02/06/2017, 12/19/2017   Influenza,inj,Quad PF,6+ Mos 12/19/2012   Influenza,trivalent, recombinat, inj, PF 01/05/2014   Janssen (J&J) SARS-COV-2 Vaccination 01/05/2020, 03/10/2020   PFIZER(Purple Top)SARS-COV-2 Vaccination 05/09/2019   Pneumococcal Conjugate-13 01/23/2014   Pneumococcal Polysaccharide-23 03/11/2021   Td 11/07/2021     ROS: See pertinent positives and negatives per HPI.  Past Medical History:  Diagnosis Date   Achalasia of esophagus    Allergic rhinitis    Allergy    multiple, mostly aspirin, levaquin and shellfish.   Anemia    Anxiety    Arthritis    Asthma    Bilateral leg edema    Cataract 2014   exraction with len implant   Chronic pain syndrome    Chronic pulmonary aspiration    Complication of anesthesia    Breathing  problems upon waking up. 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   Compressed cervical disc    COPD (chronic obstructive pulmonary disease) (HCC)    CVA (cerebral infarction)    Dyspnea    Esophageal dysmotility    Gastritis    H/O food anaphylaxis    Heart murmur    as child   History of hiatal hernia    Hypokalemia    IBS (irritable bowel syndrome)    Kidney  lesion 2018   Left   Liver lesion    Migraine    PICC (peripherally inserted central catheter) removal 02/20/2017   PONV (postoperative nausea and vomiting)    Problems with swallowing    intermittently   Pulmonary fibrosis (HCC)    Renal cell carcinoma (HCC)    small- left kidney   Seizures ([place])    only in elementary school-was on medication and was tx up until 5th grade and has had no more seizures since then   Shingles    Stroke ([place])    slurred speech, drawn face, imaging normal, occurred twice, [facility]-"TIA" if antything" 02/20/17-no residual effects   Tracheal granulation    Tracheostomy in place [facility])    Vocal cord paresis     Past Surgical History:  Procedure Laterality Date   ABDOMINAL HYSTERECTOMY     APPLICATION OF A-CELL OF HEAD/NECK N/A 02/21/2017   Procedure: APPLICATION OF A-CELL OF HEAD/NECK;  Surgeon: Wallace Going, DO;  Location: [place];  Service: Plastics;  Laterality: N/A;   BACK SURGERY     BIOPSY  04/06/2022   Procedure: BIOPSY;  Surgeon: Jerene Bears, MD;  Location: [facility] ENDOSCOPY;  Service: Gastroenterology;;   BOTOX INJECTION N/A 07/29/2013   Procedure: BOTOX INJECTION;  Surgeon: Jerene Bears, MD;  Location: [facility]COPY;  Service: Gastroenterology;  Laterality: N/A;   BOTOX INJECTION N/A 05/04/2015   Procedure: BOTOX INJECTION;  Surgeon: Jerene Bears, MD;  Location: [facility];  Service: Gastroenterology;  Laterality: N/A;   BOTOX INJECTION N/A 02/18/2018   Procedure: BOTOX INJECTION;  Surgeon: Jerene Bears, MD;  Location: [facility];  Service: Gastroenterology;  Laterality: N/A;   BOTOX INJECTION N/A 06/30/2019   Procedure: BOTOX INJECTION;  Surgeon: Jerene Bears, MD;  Location: [facility];  Service: Gastroenterology;  Laterality: N/A;   BOTOX INJECTION N/A 08/05/2020   Procedure: BOTOX INJECTION;  Surgeon: Jerene Bears, MD;  Location: [facility];  Service: Gastroenterology;  Laterality: N/A;   BOTOX INJECTION N/A 06/16/2021    Procedure: BOTOX INJECTION;  Surgeon: Jerene Bears, MD;  [facility]: [facility];  Service: Gastroenterology;  Laterality: N/A;   BOTOX INJECTION N/A 04/06/2022   Procedure: BOTOX INJECTION;  Surgeon: Jerene Bears, MD;  [facility] END[facility]COPY;  Service: Gastroenterology;  Laterality: N/A;   BREAST BIOPSY Left    neg   BREAST SURGERY Left 2002   bx of skin   CHOLECYSTECTOMY     COLONOSCOPY     DIAGNOSTIC LAPAROSCOPY     ESOPHAGEAL MANOMETRY N/A 12/16/2012   Procedure: ESOPHAGEAL MANOMETRY (EM);  Surgeon: Jerene Bears, MD;  Location: [facility];  Service: Gastroenterology;  Laterality: N/A;   ESOPHAGOGASTRODUODENOSCOPY (EGD) WITH PROPOFOL N/A 07/29/2013   Procedure: ESOPHAGOGASTRODUODENOSCOPY (EGD) WITH PROPOFOL;  Surgeon: Jerene Bears, MD;  [facility];  Service: Gastroenterology;  Laterality: N/A;  with botox injection   ESOPHAGOGASTRODUODENOSCOPY (EGD) WITH PROPOFOL N/A 05/04/2015   Procedure: ESOPHAGOGASTRODUODENOSCOPY (EGD) WITH PROPOFOL;  Surgeon: Jerene Bears, MD;  Location: [facility];  Service: Gastroenterology;  Laterality: N/A;   ESOPHAGOGASTRODUODENOSCOPY (EGD) WITH PROPOFOL N/A 02/18/2018   Procedure: ESOPHAGOGASTRODUODENOSCOPY (EGD) WITH PROPOFOL;  Surgeon: Jerene Bears, MD;  [facility];  Service: Gastroenterology;  Laterality: N/A;   ESOPHAGOGASTRODUODENOSCOPY (EGD) WITH PROPOFOL N/A 06/30/2019   Procedure: ESOPHAGOGASTRODUODENOSCOPY (EGD) WITH PROPOFOL;  Surgeon: Jerene Bears, MD;  Location: [facility];  Service: Gastroenterology;  Laterality: N/A;   ESOPHAGOGASTRODUODENOSCOPY (EGD) WITH PROPOFOL N/A 08/05/2020   Procedure: ESOPHAGOGASTRODUODENOSCOPY (EGD) WITH PROPOFOL;  Surgeon: Jerene Bears, MD;  [facility];  Service: Gastroenterology;  Laterality: N/A;   ESOPHAGOGASTRODUODENOSCOPY (EGD) WITH PROPOFOL N/A 06/16/2021   Procedure: ESOPHAGOGASTRODUODENOSCOPY (EGD) WITH PROPOFOL;  Surgeon: Jerene Bears, MD;  [facility];   Service: Gastroenterology;  Laterality: N/A;   ESOPHAGOGASTRODUODENOSCOPY (EGD) WITH PROPOFOL N/A 04/06/2022   Procedure: ESOPHAGOGASTRODUODENOSCOPY (EGD) WITH PROPOFOL;  Surgeon: Jerene Bears, MD;  Location: [facility] ENDOSCOPY;  Service: Gastroenterology;  Laterality: N/A;   EYE SURGERY     Catarct surgery 2014   Eye Surgery AS Child Left    INCISION AND DRAINAGE OF WOUND N/A 02/21/2017   Procedure: IRRIGATION AND DEBRIDEMENT WOUND NECK;  Surgeon: Wallace Going, DO;  Location: [place];  Service: Plastics;  Laterality: N/A;   JEJUNOSTOMY FEEDING TUBE     x2 both failed. no longer has   LUMBAR LAMINECTOMY/DECOMPRESSION MICRODISCECTOMY Bilateral 05/13/2018   Procedure: Laminectomy and Foraminotomy - Lumbar four-Lumbar five- bilateral;  Surgeon: Eustace Moore, MD;  Location: [place];  Service: Neurosurgery;  Laterality: Bilateral;   LUMBAR LAMINECTOMY/DECOMPRESSION MICRODISCECTOMY Bilateral 11/08/2020   Procedure: Laminectomy and Foraminotomy - bilateral - Lumbar two-Lumbar three - Lumbar three-Lumbar four;  Surgeon: Eustace Moore, MD;  Location: [place];  Service: Neurosurgery;  Laterality: Bilateral;   MULTIPLE TOOTH EXTRACTIONS     2 teeth removed   OOPHORECTOMY     POSTERIOR CERVICAL FUSION/FORAMINOTOMY N/A 01/10/2017   Procedure: LAMINECTOMY AND FORAMINOTOMY CERVICAL FOUR-CERVICAL FIVE, CERVICAL FIVE-SIX POSTERIOR CERVICAL INSTRUMENT FUSION CERVICAL THREE-CERVICAL SEVEN,CERVICAL LAMINECTOMY CERVICAL THREE-CERVICAL SEVEN.;  Surgeon: Eustace Moore, MD;  Location: [place];  Service: Neurosurgery;  Laterality: N/A;  posterior   TONSILLECTOMY     TRACHEOSTOMY  1996   done at [facility], Dr. Kathyrn Sheriff   TRACHEOSTOMY REVISION N/A 09/14/2021   Procedure: TRACHEOSTOMA REVISION, TRACHEOPLASTY;  Surgeon: Margaretha Sheffield, MD;  Location: [facility];  Service: ENT;  Laterality: N/A;   TUBAL LIGATION     VIDEO BRONCHOSCOPY Bilateral 11/20/2012   Procedure: VIDEO BRONCHOSCOPY WITH FLUORO;  Surgeon: Juanito Doom, MD;   Location: [facility];  Service: Cardiopulmonary;  Laterality: Bilateral;     Current Outpatient Medications:    acetaminophen (TYLENOL) 500 MG tablet, Take 1,000 mg by mouth 2 (two) times daily as needed for moderate pain or headache., Disp: , Rfl:    albuterol (VENTOLIN HFA) 108 (90 Base) MCG/ACT inhaler, Inhale 2 puffs into the lungs every 6 (six) hours as needed for wheezing or shortness of breath., Disp: 18 g, Rfl: 3   amoxicillin-clavulanate (AUGMENTIN) 875-125 MG tablet, Take 1 tablet by mouth 2 (two) times daily., Disp: 20 tablet, Rfl: 0   azelastine (ASTELIN) 0.1 % nasal spray, Place 1 spray into both nostrils 2 (two) times daily. Use in each nostril as directed (Patient taking differently: Place 1 spray into both nostrils 2 (two) times daily as needed for allergies. Use in each nostril as directed), Disp: 30 mL, Rfl: 6   benzonatate (TESSALON) 200 MG capsule, Take 1 capsule (  200 mg total) by mouth 3 (three) times daily as needed for cough., Disp: 30 capsule, Rfl: 0   cetirizine (ZYRTEC) 10 MG tablet, TAKE 1 TABLET BY MOUTH EVERY DAY (NOT COVERED, Disp: 90 tablet, Rfl: 3   Cyanocobalamin (VITAMIN B-12 IJ), Inject 1 Dose as directed every 30 (thirty) days., Disp: , Rfl:    diphenhydrAMINE (BENADRYL) 25 MG tablet, Take 50 mg by mouth daily as needed for allergies (Asthma)., Disp: , Rfl:    EPINEPHrine 0.3 mg/0.3 mL IJ SOAJ injection, Inject 0.3 mg into the muscle as needed for anaphylaxis. , Disp: , Rfl:    estradiol (ESTRACE) 1 MG tablet, TAKE 1 TABLET BY MOUTH EVERY DAY, Disp: 90 tablet, Rfl: 1   Fluticasone-Umeclidin-Vilant (TRELEGY ELLIPTA) 100-62.5-25 MCG/ACT AEPB, Inhale 1 puff into the lungs daily. (Patient taking differently: Inhale 1 puff into the lungs daily as needed (asthma).), Disp: 60 each, Rfl: 0   furosemide (LASIX) 20 MG tablet, TAKE 1 TABLET BY MOUTH EVERY DAY AS NEEDED (Patient taking differently: Take 20 mg by mouth daily.), Disp: 90 tablet, Rfl: 1   ibuprofen  (ADVIL,MOTRIN) 200 MG tablet, Take 400-800 mg by mouth daily as needed for headache or moderate pain., Disp: , Rfl:    ipratropium-albuterol (DUONEB) 0.5-2.5 (3) MG/3ML SOLN, Take 3 mLs by nebulization every 4 (four) hours as needed. Dx 496, Disp: 120 mL, Rfl: 2   methocarbamol (ROBAXIN) 500 MG tablet, Take 500 mg by mouth every 8 (eight) hours as needed for muscle spasms., Disp: , Rfl:    pantoprazole (PROTONIX) 40 MG tablet, Take 1 tablet (40 mg total) by mouth 2 (two) times daily., Disp: 180 tablet, Rfl: 0   rizatriptan (MAXALT-MLT) 10 MG disintegrating tablet, TAKE 1 TABLET (10 MG TOTAL) BY MOUTH DAILY AS NEEDED FOR MIGRAINE. MAY REPEAT FOR ONE DOSE 2 HOURS AFTER THE FIRST ONE IF NEEDED., Disp: 10 tablet, Rfl: 0   sodium chloride (OCEAN) 0.65 % SOLN nasal spray, Place 1 spray into both nostrils every 4 (four) hours as needed for congestion. , Disp: , Rfl:    traMADol (ULTRAM) 50 MG tablet, 0.5-1 Tablet(s) By Mouth Twice Daily PRN, Disp: , Rfl:    White Petrolatum-Mineral Oil (LUBRICANT EYE) OINT, Place 1 application into both eyes 2 (two) times daily as needed (for dry eyes). , Disp: , Rfl:    montelukast (SINGULAIR) 10 MG tablet, TAKE 1 TABLET BY MOUTH EVERYDAY AT BEDTIME (Patient not taking: Reported on 04/18/2022), Disp: 90 tablet, Rfl: 1   ondansetron (ZOFRAN-ODT) 4 MG disintegrating tablet, Take 1 tablet (4 mg total) by mouth every 6 (six) hours as needed for nausea or vomiting. (Patient not taking: Reported on 04/18/2022), Disp: 30 tablet, Rfl: 1   oxyCODONE (OXY IR/ROXICODONE) 5 MG immediate release tablet, Take 5 mg by mouth 2 (two) times daily as needed for severe pain. (Patient not taking: Reported on 04/18/2022), Disp: , Rfl:    Pseudoephedrine HCl (SUDAFED PO), Take 1 tablet by mouth daily as needed (cold symptons). (Patient not taking: Reported on 04/18/2022), Disp: , Rfl:   EXAM:  VITALS per patient if applicable:  GENERAL: alert, oriented, no audible sounds of distress  HEENT: no  obvious external abnormalities  RESP: no signs of resp distress, appears to be breathing normally  MS: moves all visible extremities without noticeable abnormality  PSYCH/NEURO: pleasant and cooperative, no obvious depression or anxiety, speech and thought processing grossly intact  ASSESSMENT AND PLAN:  Discussed the following assessment and plan:  COVID-19  -we discussed  possible serious and likely etiologies, options for evaluation and workup, limitations of telemedicine visit vs in person visit, treatment, treatment risks and precautions. Pt is agreeable to treatment via telemedicine at this moment. She feels this has turned into a sinus infection and wants to try empiric abx. Discussed risks/appropriate use, trial of nasal saline if improving first. Sent Augmentin to start if worsening or not improving. Also advised could still be dealing with active covid infection. Advised to seek prompt virtual visit or in person care if worsening, new symptoms arise, or if is not improving with treatment as expected per our conversation of expected course. Discussed options for follow up care. Did let this patient know that I do telemedicine on Tuesdays and Thursdays for [place] and those are the days I am logged into the system. Advised to schedule follow up visit with PCP, [place] virtual visits or [facility] if any further questions or concerns to avoid delays in care.   I discussed the assessment and treatment plan with the patient. The patient was provided an opportunity to ask questions and all were answered. The patient agreed with the plan and demonstrated an understanding of the instructions.     Lucretia Kern, DO

## 2022-04-18 NOTE — Patient Instructions (Signed)
-  I sent the medication(s) we discussed to your pharmacy: Meds ordered this encounter  Medications   amoxicillin-clavulanate (AUGMENTIN) 875-125 MG tablet    Sig: Take 1 tablet by mouth 2 (two) times daily.    Dispense:  20 tablet    Refill:  0   Can try nasal saline sinus rinse and peppermint herbal tea  I hope you are feeling better soon!  Seek in person care promptly if your symptoms worsen, new concerns arise or you are not improving with treatment.  It was nice to meet you today. I help [place] out with telemedicine visits on Tuesdays and Thursdays and am happy to help if you need a virtual follow up visit on those days. Otherwise, if you have any concerns or questions following this visit please schedule a follow up visit with your Primary Care office or seek care at a local urgent care clinic to avoid delays in care. If you are having severe or life threatening symptoms please call 911 and/or go to the nearest emergency room.

## 2022-04-19 NOTE — Telephone Encounter (Signed)
LVM for patient to call back and schedule a  virtual visit with the provider tomorrow or someone in the office today.  Nevan Creighton,cma

## 2022-04-19 NOTE — Telephone Encounter (Signed)
Pt called in to let Gae Bon knows that she saw Colin Benton yesterday in a virtual appt. And she prescribed her antibiotics. She wanted to say Thank you to Gae Bon for helping her out.

## 2022-05-03 ENCOUNTER — Ambulatory Visit: Payer: Medicare Other | Admitting: Pulmonary Disease

## 2022-05-17 ENCOUNTER — Other Ambulatory Visit: Payer: Self-pay | Admitting: Pulmonary Disease

## 2022-05-17 DIAGNOSIS — J849 Interstitial pulmonary disease, unspecified: Secondary | ICD-10-CM

## 2022-05-18 ENCOUNTER — Ambulatory Visit (INDEPENDENT_AMBULATORY_CARE_PROVIDER_SITE_OTHER): Payer: Medicare Other

## 2022-05-18 DIAGNOSIS — E538 Deficiency of other specified B group vitamins: Secondary | ICD-10-CM

## 2022-05-18 MED ORDER — CYANOCOBALAMIN 1000 MCG/ML IJ SOLN
1000.0000 ug | Freq: Once | INTRAMUSCULAR | Status: AC
  Administered 2022-05-18: 1000 ug via INTRAMUSCULAR

## 2022-05-18 NOTE — Progress Notes (Addendum)
Patient arrived for her B12 injection. Patient was administered her B12 injection into her left deltoid. Patient tolerated the B12 injection well and did not show any signs of distress or voice any concerns.

## 2022-05-25 ENCOUNTER — Ambulatory Visit
Admission: RE | Admit: 2022-05-25 | Discharge: 2022-05-25 | Disposition: A | Discharge status: Home / Self Care | Payer: Medicare Other | Source: Ambulatory Visit | Attending: Pulmonary Disease | Admitting: Pulmonary Disease

## 2022-05-25 DIAGNOSIS — J849 Interstitial pulmonary disease, unspecified: Secondary | ICD-10-CM | POA: Diagnosis present

## 2022-06-06 ENCOUNTER — Ambulatory Visit: Payer: Medicare Other | Admitting: Pulmonary Disease

## 2022-06-16 ENCOUNTER — Ambulatory Visit (INDEPENDENT_AMBULATORY_CARE_PROVIDER_SITE_OTHER): Payer: Medicare Other | Admitting: Family Medicine

## 2022-06-16 ENCOUNTER — Encounter: Payer: Self-pay | Admitting: Family Medicine

## 2022-06-16 VITALS — BP 90/66 | HR 69 | Temp 97.4°F | Ht 63.0 in | Wt 98.6 lb

## 2022-06-16 DIAGNOSIS — E538 Deficiency of other specified B group vitamins: Secondary | ICD-10-CM | POA: Diagnosis not present

## 2022-06-16 DIAGNOSIS — F5101 Primary insomnia: Secondary | ICD-10-CM | POA: Diagnosis not present

## 2022-06-16 DIAGNOSIS — J4531 Mild persistent asthma with (acute) exacerbation: Secondary | ICD-10-CM

## 2022-06-16 DIAGNOSIS — G43109 Migraine with aura, not intractable, without status migrainosus: Secondary | ICD-10-CM

## 2022-06-16 DIAGNOSIS — Z91018 Allergy to other foods: Secondary | ICD-10-CM

## 2022-06-16 MED ORDER — CYANOCOBALAMIN 1000 MCG/ML IJ SOLN
1000.0000 ug | Freq: Once | INTRAMUSCULAR | Status: AC
  Administered 2022-06-16: 1000 ug via INTRAMUSCULAR

## 2022-06-16 NOTE — Assessment & Plan Note (Signed)
Chronic issue.  Suspect stressors are contributing to this.  Discussed decreasing screen time.  Discussed eliminating caffeine late in the day.  Discussed the neck step would be something like trazodone to help with this.

## 2022-06-16 NOTE — Progress Notes (Signed)
Tommi Rumps, MD Phone: [phone]  Anne [name] is a 72 y.o. female who presents today for f/u.  Asthma: Patient notes this is generally adequately controlled.  She is seeing a new pulmonologist and they have her on a nebulized form of Trelegy.  She has cough, wheezing, and shortness of breath on occasion.  She did have COVID-19 a few weeks ago and notes it took a while to improve.  She still has no taste and still has some coarseness to her breathing.  Migraines: Patient notes occasionally having migraines.  She does use Maxalt.  She does note over the last several months she has had zigzag lines in her vision that make it difficult to see that last for 2 to 6 minutes.  She sees her eye doctor in the near future for evaluation.  Pineapple allergy: Patient notes she had a reaction several years ago to pineapple and then recently tried it again and had some mouth swelling and nausea.  She took Benadryl and Zofran with good benefit.  Insomnia: Patient notes continued issues with sleeping.  She has a lot of stress with her son going through detox and with her husband's health issues.  She notes no trouble falling asleep but does have trouble staying asleep.  She notes no anxiety or depression.  She does not drink much alcohol.  She does have coffee at dinnertime.  She does turn the TV on around bedtime.  Social History   Tobacco Use  Smoking Status Never  Smokeless Tobacco Never    Current Outpatient Medications on File Prior to Visit  Medication Sig Dispense Refill   acetaminophen (TYLENOL) 500 MG tablet Take 1,000 mg by mouth 2 (two) times daily as needed for moderate pain or headache.     albuterol (VENTOLIN HFA) 108 (90 Base) MCG/ACT inhaler Inhale 2 puffs into the lungs every 6 (six) hours as needed for wheezing or shortness of breath. 18 g 3   amoxicillin-clavulanate (AUGMENTIN) 875-125 MG tablet Take 1 tablet by mouth 2 (two) times daily. 20 tablet 0   azelastine (ASTELIN) 0.1  % nasal spray Place 1 spray into both nostrils 2 (two) times daily. Use in each nostril as directed (Patient taking differently: Place 1 spray into both nostrils 2 (two) times daily as needed for allergies. Use in each nostril as directed) 30 mL 6   benzonatate (TESSALON) 200 MG capsule Take 1 capsule (200 mg total) by mouth 3 (three) times daily as needed for cough. 30 capsule 0   cetirizine (ZYRTEC) 10 MG tablet TAKE 1 TABLET BY MOUTH EVERY DAY (NOT COVERED 90 tablet 3   Cyanocobalamin (VITAMIN B-12 IJ) Inject 1 Dose as directed every 30 (thirty) days.     diphenhydrAMINE (BENADRYL) 25 MG tablet Take 50 mg by mouth daily as needed for allergies (Asthma).     EPINEPHrine 0.3 mg/0.3 mL IJ SOAJ injection Inject 0.3 mg into the muscle as needed for anaphylaxis.      estradiol (ESTRACE) 1 MG tablet TAKE 1 TABLET BY MOUTH EVERY DAY 90 tablet 1   Fluticasone-Umeclidin-Vilant (TRELEGY ELLIPTA) 100-62.5-25 MCG/ACT AEPB Inhale 1 puff into the lungs daily. (Patient taking differently: Inhale 1 puff into the lungs daily as needed (asthma).) 60 each 0   furosemide (LASIX) 20 MG tablet TAKE 1 TABLET BY MOUTH EVERY DAY AS NEEDED (Patient taking differently: Take 20 mg by mouth daily.) 90 tablet 1   ibuprofen (ADVIL,MOTRIN) 200 MG tablet Take 400-800 mg by mouth daily as needed for  headache or moderate pain.     ipratropium-albuterol (DUONEB) 0.5-2.5 (3) MG/3ML SOLN Take 3 mLs by nebulization every 4 (four) hours as needed. Dx 496 120 mL 2   methocarbamol (ROBAXIN) 500 MG tablet Take 500 mg by mouth every 8 (eight) hours as needed for muscle spasms.     montelukast (SINGULAIR) 10 MG tablet TAKE 1 TABLET BY MOUTH EVERYDAY AT BEDTIME 90 tablet 1   ondansetron (ZOFRAN-ODT) 4 MG disintegrating tablet Take 1 tablet (4 mg total) by mouth every 6 (six) hours as needed for nausea or vomiting. 30 tablet 1   oxyCODONE (OXY IR/ROXICODONE) 5 MG immediate release tablet Take 5 mg by mouth 2 (two) times daily as needed for severe  pain.     pantoprazole (PROTONIX) 40 MG tablet Take 1 tablet (40 mg total) by mouth 2 (two) times daily. 180 tablet 0   Pseudoephedrine HCl (SUDAFED PO) Take 1 tablet by mouth daily as needed (cold symptons).     rizatriptan (MAXALT-MLT) 10 MG disintegrating tablet TAKE 1 TABLET (10 MG TOTAL) BY MOUTH DAILY AS NEEDED FOR MIGRAINE. MAY REPEAT FOR ONE DOSE 2 HOURS AFTER THE FIRST ONE IF NEEDED. 10 tablet 0   sodium chloride (OCEAN) 0.65 % SOLN nasal spray Place 1 spray into both nostrils every 4 (four) hours as needed for congestion.      traMADol (ULTRAM) 50 MG tablet 0.5-1 Tablet(s) By Mouth Twice Daily PRN     White Petrolatum-Mineral Oil (LUBRICANT EYE) OINT Place 1 application into both eyes 2 (two) times daily as needed (for dry eyes).      [DISCONTINUED] zolmitriptan (ZOMIG-ZMT) 5 MG disintegrating tablet Take 1 tablet (5 mg total) by mouth daily as needed for migraine. 10 tablet 0   No current facility-administered medications on file prior to visit.     ROS see history of present illness  Objective  Physical Exam Vitals:   06/16/22 0957  BP: 90/66  Pulse: 69  Temp: (!) 97.4 F (36.3 C)  SpO2: 96%    BP Readings from Last 3 Encounters:  06/16/22 90/66  04/11/22 112/72  04/06/22 119/67   Wt Readings from Last 3 Encounters:  06/16/22 98 lb 9.6 oz (44.7 kg)  04/11/22 102 lb 9.6 oz (46.5 kg)  03/31/22 102 lb 2 oz (46.3 kg)    Physical Exam Constitutional:      General: She is not in acute distress.    Appearance: She is not diaphoretic.  Cardiovascular:     Rate and Rhythm: Normal rate and regular rhythm.     Heart sounds: Normal heart sounds.  Pulmonary:     Effort: Pulmonary effort is normal. No respiratory distress.     Breath sounds: No rhonchi or rales.     Comments: Wheezing sound that seems to be coming from her throat (patient does have a history of a tracheostomy) Skin:    General: Skin is warm and dry.  Neurological:     Mental Status: She is alert.       Assessment/Plan: Please see individual problem list.  B12 deficiency Assessment & Plan: Chronic issue.  B12 injection given today.  Orders: -     Cyanocobalamin  Migraine with aura and without status migrainosus, not intractable Assessment & Plan: Chronic issue.  Adequately controlled.  She can continue as needed Maxalt 10 mg.  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   Mild persistent asthma with acute  exacerbation Assessment & Plan: Chronic issue.  Adequately controlled.  She will continue her nebulized Trelegy medication.   Primary insomnia Assessment & Plan: Chronic issue.  Suspect stressors are contributing to this.  Discussed decreasing screen time.  Discussed eliminating caffeine late in the day.  Discussed the neck step would be something like trazodone to help with this.   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    Return in about 6 months (around 12/17/2022).   Tommi Rumps, MD [facility]

## 2022-06-16 NOTE — Assessment & Plan Note (Signed)
Advised to avoid pineapples.

## 2022-06-16 NOTE — Assessment & Plan Note (Signed)
Chronic issue.  Adequately controlled.  She will continue her nebulized Trelegy medication.

## 2022-06-16 NOTE — Progress Notes (Signed)
Patient was administered a B12 injection into her right deltoid. Patient tolerated the injection well and did not show any signs of distress or voice any concerns.

## 2022-06-16 NOTE — Assessment & Plan Note (Signed)
Chronic issue.  B12 injection given today.

## 2022-06-16 NOTE — Assessment & Plan Note (Addendum)
Chronic issue.  Adequately controlled.  She can continue as needed Maxalt 10 mg.  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

## 2022-06-28 ENCOUNTER — Other Ambulatory Visit: Payer: Self-pay | Admitting: Physician Assistant

## 2022-07-17 ENCOUNTER — Ambulatory Visit (INDEPENDENT_AMBULATORY_CARE_PROVIDER_SITE_OTHER): Payer: Medicare Other

## 2022-07-17 DIAGNOSIS — E538 Deficiency of other specified B group vitamins: Secondary | ICD-10-CM | POA: Diagnosis not present

## 2022-07-17 MED ORDER — CYANOCOBALAMIN 1000 MCG/ML IJ SOLN
1000.0000 ug | Freq: Once | INTRAMUSCULAR | Status: AC
  Administered 2022-07-17: 1000 ug via INTRAMUSCULAR

## 2022-07-17 NOTE — Progress Notes (Signed)
Pt presented for their vitamin B12 injection. Pt was identified through two identifiers. Pt tolerated shot well in their right deltoid.  

## 2022-07-18 ENCOUNTER — Other Ambulatory Visit: Payer: Self-pay | Admitting: Family Medicine

## 2022-07-18 DIAGNOSIS — Z1231 Encounter for screening mammogram for malignant neoplasm of breast: Secondary | ICD-10-CM

## 2022-07-22 ENCOUNTER — Encounter: Payer: Self-pay | Admitting: Pulmonary Disease

## 2022-07-28 ENCOUNTER — Encounter: Payer: Self-pay | Admitting: Family Medicine

## 2022-08-10 ENCOUNTER — Ambulatory Visit
Admission: RE | Admit: 2022-08-10 | Discharge: 2022-08-10 | Disposition: A | Discharge status: Home / Self Care | Payer: Medicare Other | Source: Ambulatory Visit | Attending: Family Medicine | Admitting: Family Medicine

## 2022-08-10 DIAGNOSIS — Z1231 Encounter for screening mammogram for malignant neoplasm of breast: Secondary | ICD-10-CM | POA: Diagnosis not present

## 2022-08-14 ENCOUNTER — Encounter: Payer: Self-pay | Admitting: Family Medicine

## 2022-08-15 NOTE — Telephone Encounter (Signed)
Can we get her set up with me for a visit on this and we can discuss labs to evaluate for inflammatory arthritis?

## 2022-08-17 ENCOUNTER — Ambulatory Visit: Payer: Medicare Other

## 2022-08-22 ENCOUNTER — Ambulatory Visit: Payer: Medicare Other

## 2022-08-22 ENCOUNTER — Ambulatory Visit (INDEPENDENT_AMBULATORY_CARE_PROVIDER_SITE_OTHER): Payer: Medicare Other | Admitting: Nurse Practitioner

## 2022-08-22 VITALS — BP 98/60 | HR 62 | Temp 97.9°F | Ht 63.0 in | Wt 99.0 lb

## 2022-08-22 DIAGNOSIS — E538 Deficiency of other specified B group vitamins: Secondary | ICD-10-CM | POA: Diagnosis not present

## 2022-08-22 DIAGNOSIS — M7918 Myalgia, other site: Secondary | ICD-10-CM

## 2022-08-22 DIAGNOSIS — M2550 Pain in unspecified joint: Secondary | ICD-10-CM

## 2022-08-22 DIAGNOSIS — M1990 Unspecified osteoarthritis, unspecified site: Secondary | ICD-10-CM

## 2022-08-22 MED ORDER — CYANOCOBALAMIN 1000 MCG/ML IJ SOLN
1000.0000 ug | Freq: Once | INTRAMUSCULAR | Status: AC
Start: 2022-08-22 — End: 2022-08-22
  Administered 2022-08-22: 1000 ug via INTRAMUSCULAR

## 2022-08-22 NOTE — Progress Notes (Addendum)
Bethanie Dicker, NP-C Phone: [phone]  SINDIA GAMBLES is a 72 y.o. female who presents today for B12 injection and arthritis pain.   Patient has been experiencing worsening arthritis pain. The pain occurs throughout her entire body- elbows, hands (worse in her thumbs), shoulders, back/spine, and knees. She describes the pain as an aching type pain. It is worse in the mornings. She does feel that she experiences flare ups, where the pain is worse than normal. She has tried Tylenol Arthritis, Tramadol and occasionally takes Ibuprofen with mild relief. She has also tried Voltaren gel with mild relief. She is having trouble with sleep due to her pain. Denies swelling. Denies weakness. Denies difficulty with range of motion. Denies fevers.   Social History   Tobacco Use  Smoking Status Never  Smokeless Tobacco Never    Current Outpatient Medications on File Prior to Visit  Medication Sig Dispense Refill   acetaminophen (TYLENOL) 500 MG tablet Take 1,000 mg by mouth 2 (two) times daily as needed for moderate pain or headache.     albuterol (VENTOLIN HFA) 108 (90 Base) MCG/ACT inhaler Inhale 2 puffs into the lungs every 6 (six) hours as needed for wheezing or shortness of breath. 18 g 3   azelastine (ASTELIN) 0.1 % nasal spray Place 1 spray into both nostrils 2 (two) times daily. Use in each nostril as directed (Patient taking differently: Place 1 spray into both nostrils 2 (two) times daily as needed for allergies. Use in each nostril as directed) 30 mL 6   cetirizine (ZYRTEC) 10 MG tablet TAKE 1 TABLET BY MOUTH EVERY DAY (NOT COVERED 90 tablet 3   Cyanocobalamin (VITAMIN B-12 IJ) Inject 1 Dose as directed every 30 (thirty) days.     diphenhydrAMINE (BENADRYL) 25 MG tablet Take 50 mg by mouth daily as needed for allergies (Asthma).     EPINEPHrine 0.3 mg/0.3 mL IJ SOAJ injection Inject 0.3 mg into the muscle as needed for anaphylaxis.      estradiol (ESTRACE) 1 MG tablet TAKE 1 TABLET BY MOUTH  EVERY DAY 90 tablet 1   Fluticasone-Umeclidin-Vilant (TRELEGY ELLIPTA) 100-62.5-25 MCG/ACT AEPB Inhale 1 puff into the lungs daily. (Patient taking differently: Inhale 1 puff into the lungs daily as needed (asthma).) 60 each 0   furosemide (LASIX) 20 MG tablet TAKE 1 TABLET BY MOUTH EVERY DAY AS NEEDED (Patient taking differently: Take 20 mg by mouth daily.) 90 tablet 1   ibuprofen (ADVIL,MOTRIN) 200 MG tablet Take 400-800 mg by mouth daily as needed for headache or moderate pain.     ipratropium-albuterol (DUONEB) 0.5-2.5 (3) MG/3ML SOLN Take 3 mLs by nebulization every 4 (four) hours as needed. Dx 496 120 mL 2   methocarbamol (ROBAXIN) 500 MG tablet Take 500 mg by mouth every 8 (eight) hours as needed for muscle spasms.     montelukast (SINGULAIR) 10 MG tablet TAKE 1 TABLET BY MOUTH EVERYDAY AT BEDTIME 90 tablet 1   ondansetron (ZOFRAN-ODT) 4 MG disintegrating tablet Take 1 tablet (4 mg total) by mouth every 6 (six) hours as needed for nausea or vomiting. 30 tablet 1   oxyCODONE (OXY IR/ROXICODONE) 5 MG immediate release tablet Take 5 mg by mouth 2 (two) times daily as needed for severe pain.     pantoprazole (PROTONIX) 40 MG tablet TAKE 1 TABLET BY MOUTH TWICE A DAY 180 tablet 3   Pseudoephedrine HCl (SUDAFED PO) Take 1 tablet by mouth daily as needed (cold symptons).     rizatriptan (MAXALT-MLT) 10 MG  disintegrating tablet TAKE 1 TABLET (10 MG TOTAL) BY MOUTH DAILY AS NEEDED FOR MIGRAINE. MAY REPEAT FOR ONE DOSE 2 HOURS AFTER THE FIRST ONE IF NEEDED. 10 tablet 0   sodium chloride (OCEAN) 0.65 % SOLN nasal spray Place 1 spray into both nostrils every 4 (four) hours as needed for congestion.      traMADol (ULTRAM) 50 MG tablet 0.5-1 Tablet(s) By Mouth Twice Daily PRN     White Petrolatum-Mineral Oil (LUBRICANT EYE) OINT Place 1 application into both eyes 2 (two) times daily as needed (for dry eyes).      [DISCONTINUED] zolmitriptan (ZOMIG-ZMT) 5 MG disintegrating tablet Take 1 tablet (5 mg total) by  mouth daily as needed for migraine. 10 tablet 0   No current facility-administered medications on file prior to visit.    ROS see history of present illness  Objective  Physical Exam Vitals:   08/22/22 1517  BP: 98/60  Pulse: 62  Temp: 97.9 F (36.6 C)  SpO2: 99%    BP Readings from Last 3 Encounters:  08/22/22 98/60  06/16/22 90/66  04/11/22 112/72   Wt Readings from Last 3 Encounters:  08/22/22 99 lb (44.9 kg)  06/16/22 98 lb 9.6 oz (44.7 kg)  04/11/22 102 lb 9.6 oz (46.5 kg)    Physical Exam Constitutional:      General: She is not in acute distress.    Appearance: Normal appearance.  HENT:     Head: Normocephalic.  Cardiovascular:     Rate and Rhythm: Normal rate and regular rhythm.     Heart sounds: Normal heart sounds.  Pulmonary:     Effort: Pulmonary effort is normal.     Breath sounds: Normal breath sounds.  Musculoskeletal:        General: No swelling or tenderness. Normal range of motion.     Right lower leg: No edema.     Left lower leg: No edema.  Skin:    General: Skin is warm and dry.  Neurological:     General: No focal deficit present.     Mental Status: She is alert.  Psychiatric:        Mood and Affect: Mood normal.        Behavior: Behavior normal.    Assessment/Plan: Please see individual problem list.  Arthralgia, unspecified joint Assessment & Plan: Chronic issue. Worsening pain. Will recheck autoimmune markers today as outlined. Will contact patient with results. Encouraged to continue Tylenol Arthritis and PRN use of Tramadol and Ibuprofen or Naproxen. Advised ice/heat as needed on painful areas. Recommended close follow up if worsening symptoms or unable to control pain with current medication regimen.   Orders: -     ANA,IFA RA Diag Pnl w/rflx Tit/Patn -     Sedimentation rate  B12 deficiency -     Cyanocobalamin    Return if symptoms worsen or fail to improve, for Follow up as scheduled with PCP.   Bethanie Dicker,  NP-C  Primary Care - [facility]

## 2022-08-22 NOTE — Progress Notes (Signed)
Pt presented for an OV today and wanting her B12 injection. Pt was identified through two identifiers. Pt tolerated shot well in the left deltoid.

## 2022-08-23 LAB — SEDIMENTATION RATE: Sed Rate: 4 mm/hr (ref 0–30)

## 2022-08-24 ENCOUNTER — Encounter: Payer: Self-pay | Admitting: Nurse Practitioner

## 2022-08-24 LAB — ANA,IFA RA DIAG PNL W/RFLX TIT/PATN
Anti Nuclear Antibody (ANA): NEGATIVE
Cyclic Citrullin Peptide Ab: <16 UNITS
Rheumatoid fact SerPl-aCnc: 10 IU/mL (ref <14)

## 2022-08-30 NOTE — Assessment & Plan Note (Addendum)
Chronic issue. Worsening pain. Will recheck autoimmune markers today as outlined. Will contact patient with results. Encouraged to continue Tylenol Arthritis and PRN use of Tramadol and Ibuprofen or Naproxen. Advised ice/heat as needed on painful areas. Recommended close follow up if worsening symptoms or unable to control pain with current medication regimen.

## 2022-08-30 NOTE — Assessment & Plan Note (Deleted)
Chronic issue. Worsening pain. Encouraged to continue Tylenol Arthritis and PRN use of Tramadol and Ibuprofen or Naproxen. Advised ice/heat as needed on painful areas.

## 2022-09-05 ENCOUNTER — Ambulatory Visit: Payer: Medicare Other | Admitting: Family Medicine

## 2022-09-05 ENCOUNTER — Ambulatory Visit: Payer: Medicare Other | Admitting: Nurse Practitioner

## 2022-09-08 ENCOUNTER — Telehealth: Payer: Self-pay | Admitting: Family Medicine

## 2022-09-12 ENCOUNTER — Telehealth: Payer: Self-pay

## 2022-09-12 ENCOUNTER — Telehealth (INDEPENDENT_AMBULATORY_CARE_PROVIDER_SITE_OTHER): Payer: Medicare Other | Admitting: Nurse Practitioner

## 2022-09-12 ENCOUNTER — Other Ambulatory Visit: Payer: Self-pay

## 2022-09-12 DIAGNOSIS — J0100 Acute maxillary sinusitis, unspecified: Secondary | ICD-10-CM | POA: Diagnosis not present

## 2022-09-12 DIAGNOSIS — Z1152 Encounter for screening for COVID-19: Secondary | ICD-10-CM

## 2022-09-12 MED ORDER — AMOXICILLIN-POT CLAVULANATE 875-125 MG PO TABS
1.0000 | ORAL_TABLET | Freq: Two times a day (BID) | ORAL | 0 refills | Status: DC
Start: 2022-09-12 — End: 2022-09-15

## 2022-09-12 NOTE — Telephone Encounter (Signed)
Called pt to discuss her reason for the telehealth appt and gather more information. Upon receiving the information I asked pt if she would like to come by the office to get a covid test done as she stated her covid test were out of date.  Pt was advised to pull around the back of the building call the front office and let us know she is here and I will come out and swab her for covid.    I also advised pt to remain in the parking lot afterwards and we can get her telehealth started early as well.

## 2022-09-12 NOTE — Assessment & Plan Note (Signed)
COVID test negative. Symptoms consistent with sinusitis. Will treat with Augmentin 875 BID x 10 days. Counseled on common side effects. She has Benzonatate at home to use for her cough. Advised to continue using nebulizer and albuterol inhaler as needed. She can continue her nasal spray. Advised adequate fluid intake. Strict return precautions given to patient.

## 2022-09-12 NOTE — Telephone Encounter (Signed)
Prescription Request  09/12/2022  LOV: 06/16/2022  What is the name of the medication or equipment? pantoprazole   Have you contacted your pharmacy to request a refill? Yes   Which pharmacy would you like this sent to?  [id number] [place], [place] - [address] [place] [address] Phone: [phone] Fax: [phone]    Patient notified that their request is being sent to the clinical staff for review and that they should receive a response within 2 business days.   Please advise at Mobile [phone] (mobile)

## 2022-09-12 NOTE — Progress Notes (Signed)
MyChart Video Visit    Virtual Visit via Video Note   This visit type was conducted because this format is felt to be most appropriate for this patient at this time. Physical exam was limited by quality of the video and audio technology used for the visit. CMA was able to get the patient set up on a video visit.  Patient location: Office parking lot, in vehicle. Patient and provider in visit Provider location: Office  I discussed the limitations of evaluation and management by telemedicine and the availability of in person appointments. The patient expressed understanding and agreed to proceed.  Visit Date: 09/12/2022  Today's healthcare provider: Bethanie Dicker, NP     Subjective:    Patient ID: Anne [name], female    DOB: [birth year], 72 y.o.   MRN: [id number]  Chief Complaint  Patient presents with   Nasal Congestion    And no taste     HPI  Interactive audio and video telecommunications were attempted between this provider and patient, however failed, due to patient having technical difficulties OR patient did not have access to video capability.  We continued and completed visit with audio only.   Symptoms started on Saturday and continue to worsen. She had a COVID test here at the office that was negative. She is unable to taste or smell anything due to increased congestion.   Respiratory illness:  Cough- Yes  Congestion-    Sinus- Yes, pressure   Chest- No  Post nasal drip- Yes  Sore throat- Yes  Shortness of breath- Mild, on exertion  Fever- No  Fatigue/Myalgia- Yes Headache- Yes Nausea/Vomiting- No Taste disturbance- Yes  Smell disturbance- Yes  Covid exposure- No  Covid vaccination- x 3  Flu vaccination- UTD  Medications- Nasal spray, nebulizer treatments    Past Medical History:  Diagnosis Date   Achalasia of esophagus    Allergic rhinitis    Allergy    multiple, mostly aspirin, levaquin and shellfish.   Anemia    Anxiety    Arthritis     Asthma    Bilateral leg edema    Cataract 2014   exraction with len implant   Chronic pain syndrome    Chronic pulmonary aspiration    Complication of anesthesia    Breathing problems upon waking up. 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   Compressed cervical disc    COPD (chronic obstructive pulmonary disease) (HCC)    CVA (cerebral infarction)    Dyspnea    Esophageal dysmotility    Gastritis    H/O food anaphylaxis    Heart murmur    as child   History of hiatal hernia    Hypokalemia    IBS (irritable bowel syndrome)    Kidney lesion 2018   Left   Liver lesion    Migraine    PICC (peripherally inserted central catheter) removal 02/20/2017   PONV (postoperative nausea and vomiting)    Problems with swallowing    intermittently   Pulmonary fibrosis (HCC)    Renal cell carcinoma (HCC)    small- left kidney   S/P cervical spinal fusion 01/10/2017   Seizures (HCC)    only in elementary school-was on medication and was tx up until 5th grade and has had no more seizures since then   Shingles    Stroke [facility])    slurred speech, drawn face, imaging normal, occurred twice, [facility]-"TIA" if antything" 02/20/17-no residual effects   Tracheal granulation  Tracheostomy in place [facility])    Vocal cord paresis     Past Surgical History:  Procedure Laterality Date   ABDOMINAL HYSTERECTOMY     APPLICATION OF A-CELL OF HEAD/NECK N/A 02/21/2017   Procedure: APPLICATION OF A-CELL OF HEAD/NECK;  Surgeon: Peggye Form, DO;  Location: [facility] OR;  Service: Plastics;  Laterality: N/A;   BACK SURGERY     BIOPSY  04/06/2022   Procedure: BIOPSY;  Surgeon: Beverley Fiedler, MD;  Location: [facility] ENDOSCOPY;  Service: Gastroenterology;;   BOTOX INJECTION N/A 07/29/2013   Procedure: BOTOX INJECTION;  Surgeon: Beverley Fiedler, MD;  Location: [facility] ENDOSCOPY;  Service: Gastroenterology;  Laterality: N/A;   BOTOX INJECTION N/A 05/04/2015   Procedure: BOTOX INJECTION;  Surgeon: Beverley Fiedler, MD;  Location: [facility] ENDOSCOPY;  Service: Gastroenterology;  Laterality: N/A;   BOTOX INJECTION N/A 02/18/2018   Procedure: BOTOX INJECTION;  Surgeon: Beverley Fiedler, MD;  Location: [facility] ENDOSCOPY;  Service: Gastroenterology;  Laterality: N/A;   BOTOX INJECTION N/A 06/30/2019   Procedure: BOTOX INJECTION;  Surgeon: Beverley Fiedler, MD;  Location: [facility] ENDOSCOPY;  Service: Gastroenterology;  Laterality: N/A;   BOTOX INJECTION N/A 08/05/2020   Procedure: BOTOX INJECTION;  Surgeon: Beverley Fiedler, MD;  Location: [facility] ENDOSCOPY;  Service: Gastroenterology;  Laterality: N/A;   BOTOX INJECTION N/A 06/16/2021   Procedure: BOTOX INJECTION;  Surgeon: Beverley Fiedler, MD;  Location: [facility] ENDOSCOPY;  Service: Gastroenterology;  Laterality: N/A;   BOTOX INJECTION N/A 04/06/2022   Procedure: BOTOX INJECTION;  Surgeon: Beverley Fiedler, MD;  Location: [facility] ENDOSCOPY;  Service: Gastroenterology;  Laterality: N/A;   BREAST BIOPSY Left    neg   BREAST SURGERY Left 2002   bx of skin   CHOLECYSTECTOMY     COLONOSCOPY     DIAGNOSTIC LAPAROSCOPY     ESOPHAGEAL MANOMETRY N/A 12/16/2012   Procedure: ESOPHAGEAL MANOMETRY (EM);  Surgeon: Beverley Fiedler, MD;  Location: [facility] ENDOSCOPY;  Service: Gastroenterology;  Laterality: N/A;   ESOPHAGOGASTRODUODENOSCOPY (EGD) WITH PROPOFOL N/A 07/29/2013   Procedure: ESOPHAGOGASTRODUODENOSCOPY (EGD) WITH PROPOFOL;  Surgeon: Beverley Fiedler, MD;  Location: [facility] ENDOSCOPY;  Service: Gastroenterology;  Laterality: N/A;  with botox injection   ESOPHAGOGASTRODUODENOSCOPY (EGD) WITH PROPOFOL N/A 05/04/2015   Procedure: ESOPHAGOGASTRODUODENOSCOPY (EGD) WITH PROPOFOL;  Surgeon: Beverley Fiedler, MD;  [facility]: [facility] ENDOSCOPY;  Service: Gastroenterology;  Laterality: N/A;   ESOPHAGOGASTRODUODENOSCOPY (EGD) WITH PROPOFOL N/A 02/18/2018   Procedure: ESOPHAGOGASTRODUODENOSCOPY (EGD) WITH PROPOFOL;  Surgeon: Beverley Fiedler, MD;  Location: [facility] ENDOSCOPY;  Service: Gastroenterology;  Laterality: N/A;    ESOPHAGOGASTRODUODENOSCOPY (EGD) WITH PROPOFOL N/A 06/30/2019   Procedure: ESOPHAGOGASTRODUODENOSCOPY (EGD) WITH PROPOFOL;  Surgeon: Beverley Fiedler, MD;  Location: [facility] ENDOSCOPY;  Service: Gastroenterology;  Laterality: N/A;   ESOPHAGOGASTRODUODENOSCOPY (EGD) WITH PROPOFOL N/A 08/05/2020   Procedure: ESOPHAGOGASTRODUODENOSCOPY (EGD) WITH PROPOFOL;  Surgeon: Beverley Fiedler, MD;  Location: [facility] ENDOSCOPY;  Service: Gastroenterology;  Laterality: N/A;   ESOPHAGOGASTRODUODENOSCOPY (EGD) WITH PROPOFOL N/A 06/16/2021   Procedure: ESOPHAGOGASTRODUODENOSCOPY (EGD) WITH PROPOFOL;  Surgeon: Beverley Fiedler, MD;  Location: [facility] ENDOSCOPY;  Service: Gastroenterology;  Laterality: N/A;   ESOPHAGOGASTRODUODENOSCOPY (EGD) WITH PROPOFOL N/A 04/06/2022   Procedure: ESOPHAGOGASTRODUODENOSCOPY (EGD) WITH PROPOFOL;  Surgeon: Beverley Fiedler, MD;  Location: [facility] ENDOSCOPY;  Service: Gastroenterology;  Laterality: N/A;   EYE SURGERY     Catarct surgery 2014   Eye Surgery AS Child Left    INCISION AND DRAINAGE OF WOUND N/A 02/21/2017   Procedure: IRRIGATION AND DEBRIDEMENT WOUND NECK;  Surgeon: Peggye Form, DO;  Location: [facility] OR;  Service: Plastics;  Laterality: N/A;   JEJUNOSTOMY FEEDING TUBE     x2 both failed. no longer has   LUMBAR LAMINECTOMY/DECOMPRESSION MICRODISCECTOMY Bilateral 05/13/2018   Procedure: Laminectomy and Foraminotomy - Lumbar four-Lumbar five- bilateral;  Surgeon: Tia Alert, MD;  Location[facility] OR;  Service: Neurosurgery;  Laterality: Bilateral;   LUMBAR LAMINECTOMY/DECOMPRESSION MICRODISCECTOMY Bilateral 11/08/2020   Procedure: Laminectomy and Foraminotomy - bilateral - Lumbar two-Lumbar three - Lumbar three-Lumbar four;  Surgeon: Tia Alert, MD;  Location[facility] OR;  Service: Neurosurgery;  Laterality: Bilateral;   MULTIPLE TOOTH EXTRACTIONS     2 teeth removed   OOPHORECTOMY     POSTERIOR CERVICAL FUSION/FORAMINOTOMY N/A 01/10/2017   Procedure: LAMINECTOMY AND FORAMINOTOMY CERVICAL FOUR-CERVICAL  FIVE, CERVICAL FIVE-SIX POSTERIOR CERVICAL INSTRUMENT FUSION CERVICAL THREE-CERVICAL SEVEN,CERVICAL LAMINECTOMY CERVICAL THREE-CERVICAL SEVEN.;  Surgeon: Tia Alert, MD;  Location: [facility] OR;  Service: Neurosurgery;  Laterality: N/A;  posterior   TONSILLECTOMY     TRACHEOSTOMY  1996   done at [facility], Dr. Elenore Rota   TRACHEOSTOMY REVISION N/A 09/14/2021   Procedure: TRACHEOSTOMA REVISION, TRACHEOPLASTY;  Surgeon: Vernie Murders, MD;  Location: [facility] ORS;  Service: ENT;  Laterality: N/A;   TUBAL LIGATION     VIDEO BRONCHOSCOPY Bilateral 11/20/2012   Procedure: VIDEO BRONCHOSCOPY WITH FLUORO;  Surgeon: Lupita Leash, MD;  Location: [facility] ENDOSCOPY;  Service: Cardiopulmonary;  Laterality: Bilateral;    Family History  Problem Relation Age of Onset   Asthma Cousin    COPD Cousin    Breast cancer Maternal Grandmother 12   Asthma Father    Kidney cancer Father    Arrhythmia Father    Lung cancer Father    Lung cancer Paternal Uncle    COPD Paternal Grandfather    Alcohol abuse Mother    Breast cancer Maternal Aunt 49   Bladder Cancer Neg Hx    Colon cancer Neg Hx    Esophageal cancer Neg Hx    Pancreatic cancer Neg Hx    Stomach cancer Neg Hx    Liver disease Neg Hx     Social History   Socioeconomic History   Marital status: Married    Spouse name: Not on file   Number of children: 4   Years of education: Not on file   Highest education level: Some college, no degree  Occupational History   Not on file  Tobacco Use   Smoking status: Never   Smokeless tobacco: Never  Vaping Use   Vaping Use: Never used  Substance and Sexual Activity   Alcohol use: Yes    Comment: occ wine   Drug use: No   Sexual activity: Yes  Other Topics Concern   Not on file  Social History Narrative   Lives in [place] with husband.  She has four children.   Retired from [employer]: Low Risk  (08/22/2022)   Overall Financial  Resource Strain (CARDIA)    Difficulty of Paying Living Expenses: Not hard at all  Food Insecurity: No Food Insecurity (08/22/2022)   Hunger Vital Sign    Worried About Running Out of Food in the Last Year: Never true    Ran Out of Food in the Last Year: Never true  Transportation Needs: No Transportation Needs (08/22/2022)   PRAPARE - Administrator, Civil Service (Medical): No  Lack of Transportation (Non-Medical): No  Physical Activity: Sufficiently Active (08/22/2022)   Exercise Vital Sign    Days of Exercise per Week: 7 days    Minutes of Exercise per Session: 30 min  Stress: No Stress Concern Present (08/22/2022)   [employer] of Occupational Health - Occupational Stress Questionnaire    Feeling of Stress : Not at all  Social Connections: Socially Integrated (08/22/2022)   Social Connection and Isolation Panel [NHANES]    Frequency of Communication with Friends and Family: More than three times a week    Frequency of Social Gatherings with Friends and Family: More than three times a week    Attends Religious Services: More than 4 times per year    Active Member of [employer] or Organizations: Yes    Attends Engineer, structural: More than 4 times per year    Marital Status: Married  Catering manager Violence: Not At Risk (02/02/2022)   Humiliation, Afraid, Rape, and Kick questionnaire    Fear of Current or Ex-Partner: No    Emotionally Abused: No    Physically Abused: No    Sexually Abused: No    Outpatient Medications Prior to Visit  Medication Sig Dispense Refill   acetaminophen (TYLENOL) 500 MG tablet Take 1,000 mg by mouth 2 (two) times daily as needed for moderate pain or headache.     albuterol (VENTOLIN HFA) 108 (90 Base) MCG/ACT inhaler Inhale 2 puffs into the lungs every 6 (six) hours as needed for wheezing or shortness of breath. 18 g 3   azelastine (ASTELIN) 0.1 % nasal spray Place 1 spray into both nostrils 2 (two) times daily. Use in each  nostril as directed (Patient taking differently: Place 1 spray into both nostrils 2 (two) times daily as needed for allergies. Use in each nostril as directed) 30 mL 6   cetirizine (ZYRTEC) 10 MG tablet TAKE 1 TABLET BY MOUTH EVERY DAY (NOT COVERED 90 tablet 3   Cyanocobalamin (VITAMIN B-12 IJ) Inject 1 Dose as directed every 30 (thirty) days.     diphenhydrAMINE (BENADRYL) 25 MG tablet Take 50 mg by mouth daily as needed for allergies (Asthma).     EPINEPHrine 0.3 mg/0.3 mL IJ SOAJ injection Inject 0.3 mg into the muscle as needed for anaphylaxis.      estradiol (ESTRACE) 1 MG tablet TAKE 1 TABLET BY MOUTH EVERY DAY 90 tablet 1   Fluticasone-Umeclidin-Vilant (TRELEGY ELLIPTA) 100-62.5-25 MCG/ACT AEPB Inhale 1 puff into the lungs daily. (Patient taking differently: Inhale 1 puff into the lungs daily as needed (asthma).) 60 each 0   furosemide (LASIX) 20 MG tablet TAKE 1 TABLET BY MOUTH EVERY DAY AS NEEDED 90 tablet 1   ibuprofen (ADVIL,MOTRIN) 200 MG tablet Take 400-800 mg by mouth daily as needed for headache or moderate pain.     ipratropium-albuterol (DUONEB) 0.5-2.5 (3) MG/3ML SOLN Take 3 mLs by nebulization every 4 (four) hours as needed. Dx 496 120 mL 2   methocarbamol (ROBAXIN) 500 MG tablet Take 500 mg by mouth every 8 (eight) hours as needed for muscle spasms.     montelukast (SINGULAIR) 10 MG tablet TAKE 1 TABLET BY MOUTH EVERYDAY AT BEDTIME 90 tablet 1   ondansetron (ZOFRAN-ODT) 4 MG disintegrating tablet Take 1 tablet (4 mg total) by mouth every 6 (six) hours as needed for nausea or vomiting. 30 tablet 1   oxyCODONE (OXY IR/ROXICODONE) 5 MG immediate release tablet Take 5 mg by mouth 2 (two) times daily as needed for severe  pain.     pantoprazole (PROTONIX) 40 MG tablet TAKE 1 TABLET BY MOUTH TWICE A DAY 180 tablet 3   Pseudoephedrine HCl (SUDAFED PO) Take 1 tablet by mouth daily as needed (cold symptons).     rizatriptan (MAXALT-MLT) 10 MG disintegrating tablet TAKE 1 TABLET (10 MG  TOTAL) BY MOUTH DAILY AS NEEDED FOR MIGRAINE. MAY REPEAT FOR ONE DOSE 2 HOURS AFTER THE FIRST ONE IF NEEDED. 10 tablet 0   sodium chloride (OCEAN) 0.65 % SOLN nasal spray Place 1 spray into both nostrils every 4 (four) hours as needed for congestion.      traMADol (ULTRAM) 50 MG tablet 0.5-1 Tablet(s) By Mouth Twice Daily PRN     White Petrolatum-Mineral Oil (LUBRICANT EYE) OINT Place 1 application into both eyes 2 (two) times daily as needed (for dry eyes).      No facility-administered medications prior to visit.    Allergies  Allergen Reactions   Asa [Aspirin] Shortness Of Breath, Swelling and Other (See Comments)    Tongue swells   Bee Venom Anaphylaxis and Shortness Of Breath   Epinephrine Shortness Of Breath and Palpitations    Occurs when pt is given double the dose, pt would use epipen as needed    Feraheme [Ferumoxytol] Shortness Of Breath    Light headed    Influenza Vaccines Shortness Of Breath and Other (See Comments)    Can take flu vaccines without eggs    Pfizer-Biontech Covid-19 Vacc [Covid-19 Mrna Vacc (Moderna)] Anaphylaxis   Quinolones Swelling and Other (See Comments)    Feet swell   Shellfish Allergy Anaphylaxis   Celebrex [Celecoxib] Nausea And Vomiting    As well as headache   Ciprofloxacin Swelling and Other (See Comments)    ALL MEDS ENDING IN -FLOXACIN MAKE THE FEET SWELL   Clarithromycin Nausea Only    Abdomina pain   Erythromycin Nausea Only    Abdominal pain   Levaquin [Levofloxacin In D5w] Swelling and Other (See Comments)    Feet and legs ache and SWELL   Peanut-Containing Drug Products Other (See Comments)    Wheezing (boiled or raw peanuts)   Pineapple Swelling    Mouth swells and Patient gets nauseated   Septra [Sulfamethoxazole-Trimethoprim] Diarrhea   Telithromycin Nausea Only    Abdominal pain   Zanaflex [Tizanidine] Other (See Comments)    Caused the patient to feel "spaced out" and "not well"   Adhesive [Tape] Rash and Other (See  Comments)    Paper works tape works fine     ROS See HPI    Objective:    Physical Exam  There were no vitals taken for this visit. Wt Readings from Last 3 Encounters:  08/22/22 99 lb (44.9 kg)  06/16/22 98 lb 9.6 oz (44.7 kg)  04/11/22 102 lb 9.6 oz (46.5 kg)   GENERAL: alert, oriented, appears well and in no acute distress   HEENT: atraumatic, conjunttiva clear, no obvious abnormalities on inspection of external nose and ears   NECK: normal movements of the head and neck   LUNGS: on inspection no signs of respiratory distress, breathing rate appears normal, no obvious gross SOB, gasping or wheezing   CV: no obvious cyanosis   MS: moves all visible extremities without noticeable abnormality   PSYCH/NEURO: pleasant and cooperative, no obvious depression or anxiety, speech and thought processing grossly intact     Assessment & Plan:   Problem List Items Addressed This Visit       Respiratory  Acute non-recurrent maxillary sinusitis - Primary    COVID test negative. Symptoms consistent with sinusitis. Will treat with Augmentin 875 BID x 10 days. Counseled on common side effects. She has Benzonatate at home to use for her cough. Advised to continue using nebulizer and albuterol inhaler as needed. She can continue her nasal spray. Advised adequate fluid intake. Strict return precautions given to patient.       Relevant Medications   amoxicillin-clavulanate (AUGMENTIN) 875-125 MG tablet    I am having Anne [name] start on amoxicillin-clavulanate. I am also having her maintain her sodium chloride, acetaminophen, ipratropium-albuterol, Lubricant Eye, ibuprofen, diphenhydrAMINE, rizatriptan, EPINEPHrine, azelastine, Pseudoephedrine HCl (SUDAFED PO), oxyCODONE, Cyanocobalamin (VITAMIN B-12 IJ), albuterol, methocarbamol, Trelegy Ellipta, ondansetron, traMADol, estradiol, cetirizine, pantoprazole, furosemide, and montelukast.  Meds ordered this encounter  Medications    amoxicillin-clavulanate (AUGMENTIN) 875-125 MG tablet    Sig: Take 1 tablet by mouth 2 (two) times daily.    Dispense:  20 tablet    Refill:  0    Order Specific Question:   Supervising Provider    Answer:   Birdie Sons, ERIC G [[id number]]    I discussed the assessment and treatment plan with the patient. The patient was provided an opportunity to ask questions and all were answered. The patient agreed with the plan and demonstrated an understanding of the instructions.   The patient was advised to call back or seek an in-person evaluation if the symptoms worsen or if the condition fails to improve as anticipated.   Bethanie Dicker, NP [facility] [phone] (phone) [phone] (fax)  [facility]

## 2022-09-13 NOTE — Telephone Encounter (Signed)
LMTCB. Need to let pt know that she has refills at the pharmacy and they will have one ready for her to pick up today around 4 pm.

## 2022-09-14 NOTE — Telephone Encounter (Signed)
Pt returned Shanda Bumps CMA call. Note below was read to her. Pt aware and understood of med been at pharmacy.

## 2022-09-15 ENCOUNTER — Telehealth: Payer: Self-pay | Admitting: Family Medicine

## 2022-09-15 ENCOUNTER — Other Ambulatory Visit: Payer: Self-pay | Admitting: Nurse Practitioner

## 2022-09-15 DIAGNOSIS — J0100 Acute maxillary sinusitis, unspecified: Secondary | ICD-10-CM

## 2022-09-15 MED ORDER — DOXYCYCLINE HYCLATE 100 MG PO TABS
100.0000 mg | ORAL_TABLET | Freq: Two times a day (BID) | ORAL | 0 refills | Status: DC
Start: 2022-09-15 — End: 2022-09-25

## 2022-09-15 NOTE — Telephone Encounter (Signed)
Pt called in stating she saw Anne [name] 09/12/22 and got amoxillin for treatment. However, as per pt, the antibiotics its nt working and it just making her an upset stomach. She would like to know if provider can change the meds? Or what can she do??

## 2022-09-15 NOTE — Telephone Encounter (Signed)
Called pt to check to see if she was able to read the mychart or hear the VM and she verbalized she did and she has taken the 1st dose of the new abx and she understands that if she is not better by Monday/ get worst to call and book another appt.

## 2022-09-18 ENCOUNTER — Telehealth: Payer: Self-pay

## 2022-09-18 NOTE — Telephone Encounter (Signed)
Patient states she started taking the doxycycline and it seemed like it was working but then she broke out in hives on Saturday (09/16/2022) all over.  Patient states she spoke with her pharmacist at [facility] and she told her to stop taking it.  Patient states she would like to know if there is something else she can take.  Patient states she still can't taste or smell, but the medication she has taken does seem to have broken it up (patient states she took Correcidin yesterday).  Patient states she would like to know what Bethanie Dicker, NP, recommends.  Patient states she was able to smell a little bit of perfume yesterday.

## 2022-09-18 NOTE — Telephone Encounter (Signed)
Noted  

## 2022-09-18 NOTE — Telephone Encounter (Signed)
Patient called back and we do not have an in-person visit available.  Patient states she will go to [facility].

## 2022-09-19 ENCOUNTER — Telehealth: Payer: Self-pay

## 2022-09-19 NOTE — Telephone Encounter (Signed)
Patient states she went to the walk-in clinic yesterday and they gave her medication for the hives (20 mg prednisone, twice a day for five days).  Patient states they would like for her to follow-up with [place].  Patient has been scheduled to see Dr. Marikay Alar on 09/25/2022.

## 2022-09-19 NOTE — Telephone Encounter (Signed)
Noted  

## 2022-09-25 ENCOUNTER — Ambulatory Visit: Payer: Medicare Other

## 2022-09-25 ENCOUNTER — Encounter: Payer: Self-pay | Admitting: Family Medicine

## 2022-09-25 ENCOUNTER — Ambulatory Visit (INDEPENDENT_AMBULATORY_CARE_PROVIDER_SITE_OTHER): Payer: Medicare Other | Admitting: Family Medicine

## 2022-09-25 VITALS — BP 102/74 | HR 75 | Temp 97.4°F | Ht 63.0 in | Wt 101.0 lb

## 2022-09-25 DIAGNOSIS — J0140 Acute pansinusitis, unspecified: Secondary | ICD-10-CM

## 2022-09-25 DIAGNOSIS — E538 Deficiency of other specified B group vitamins: Secondary | ICD-10-CM

## 2022-09-25 MED ORDER — CYANOCOBALAMIN 1000 MCG/ML IJ SOLN
1000.0000 ug | Freq: Once | INTRAMUSCULAR | Status: AC
Start: 2022-09-25 — End: 2022-09-25
  Administered 2022-09-25: 1000 ug via INTRAMUSCULAR

## 2022-09-25 MED ORDER — CEFPODOXIME PROXETIL 200 MG PO TABS
200.0000 mg | ORAL_TABLET | Freq: Two times a day (BID) | ORAL | 0 refills | Status: DC
Start: 2022-09-25 — End: 2022-11-14

## 2022-09-25 MED ORDER — CLINDAMYCIN HCL 300 MG PO CAPS
300.0000 mg | ORAL_CAPSULE | Freq: Three times a day (TID) | ORAL | 0 refills | Status: DC
Start: 2022-09-25 — End: 2022-11-14

## 2022-09-25 NOTE — Patient Instructions (Signed)
Nice to see you. We are going to start you on cefpodoxime and clindamycin to help with your sinusitis.  If you develop diarrhea while on these please let us know right away. If you are not improving over the next 4 to 5 days please let us know.

## 2022-09-25 NOTE — Progress Notes (Signed)
Marikay Alar, MD Phone: [phone]  Anne [name] is a 72 y.o. female who presents today for same-day visit.  Sinusitis/hives: Patient notes she has had a sinus infection over the last 3 weeks.  She reports initially being treated with Augmentin though that was not helpful and then she was switched to doxycycline.  She took 3 doses of doxycycline and then developed hives.  She discontinued the doxycycline and went to urgent care and was treated with prednisone for 5 days.  The hives have resolved at this point.  She still has lots of sinus congestion and pressure in her head.  She notes continued cough that is occasionally productive.  She feels dizzy and has fullness in her ears.  She notes may be having fevers at first though none since then.  Social History   Tobacco Use  Smoking Status Never  Smokeless Tobacco Never    Current Outpatient Medications on File Prior to Visit  Medication Sig Dispense Refill   acetaminophen (TYLENOL) 500 MG tablet Take 1,000 mg by mouth 2 (two) times daily as needed for moderate pain or headache.     albuterol (VENTOLIN HFA) 108 (90 Base) MCG/ACT inhaler Inhale 2 puffs into the lungs every 6 (six) hours as needed for wheezing or shortness of breath. 18 g 3   azelastine (ASTELIN) 0.1 % nasal spray Place 1 spray into both nostrils 2 (two) times daily. Use in each nostril as directed (Patient taking differently: Place 1 spray into both nostrils 2 (two) times daily as needed for allergies. Use in each nostril as directed) 30 mL 6   cetirizine (ZYRTEC) 10 MG tablet TAKE 1 TABLET BY MOUTH EVERY DAY (NOT COVERED 90 tablet 3   Cyanocobalamin (VITAMIN B-12 IJ) Inject 1 Dose as directed every 30 (thirty) days.     diphenhydrAMINE (BENADRYL) 25 MG tablet Take 50 mg by mouth daily as needed for allergies (Asthma).     EPINEPHrine 0.3 mg/0.3 mL IJ SOAJ injection Inject 0.3 mg into the muscle as needed for anaphylaxis.      estradiol (ESTRACE) 1 MG tablet TAKE 1  TABLET BY MOUTH EVERY DAY 90 tablet 1   Fluticasone-Umeclidin-Vilant (TRELEGY ELLIPTA) 100-62.5-25 MCG/ACT AEPB Inhale 1 puff into the lungs daily. (Patient taking differently: Inhale 1 puff into the lungs daily as needed (asthma).) 60 each 0   furosemide (LASIX) 20 MG tablet TAKE 1 TABLET BY MOUTH EVERY DAY AS NEEDED 90 tablet 1   ibuprofen (ADVIL,MOTRIN) 200 MG tablet Take 400-800 mg by mouth daily as needed for headache or moderate pain.     ipratropium-albuterol (DUONEB) 0.5-2.5 (3) MG/3ML SOLN Take 3 mLs by nebulization every 4 (four) hours as needed. Dx 496 120 mL 2   montelukast (SINGULAIR) 10 MG tablet TAKE 1 TABLET BY MOUTH EVERYDAY AT BEDTIME 90 tablet 1   ondansetron (ZOFRAN-ODT) 4 MG disintegrating tablet Take 1 tablet (4 mg total) by mouth every 6 (six) hours as needed for nausea or vomiting. 30 tablet 1   pantoprazole (PROTONIX) 40 MG tablet TAKE 1 TABLET BY MOUTH TWICE A DAY 180 tablet 3   rizatriptan (MAXALT-MLT) 10 MG disintegrating tablet TAKE 1 TABLET (10 MG TOTAL) BY MOUTH DAILY AS NEEDED FOR MIGRAINE. MAY REPEAT FOR ONE DOSE 2 HOURS AFTER THE FIRST ONE IF NEEDED. 10 tablet 0   sodium chloride (OCEAN) 0.65 % SOLN nasal spray Place 1 spray into both nostrils every 4 (four) hours as needed for congestion.      traMADol (ULTRAM) 50  MG tablet 0.5-1 Tablet(s) By Mouth Twice Daily PRN     White Petrolatum-Mineral Oil (LUBRICANT EYE) OINT Place 1 application into both eyes 2 (two) times daily as needed (for dry eyes).      methocarbamol (ROBAXIN) 500 MG tablet Take 500 mg by mouth every 8 (eight) hours as needed for muscle spasms. (Patient not taking: Reported on 09/25/2022)     oxyCODONE (OXY IR/ROXICODONE) 5 MG immediate release tablet Take 5 mg by mouth 2 (two) times daily as needed for severe pain. (Patient not taking: Reported on 09/25/2022)     Pseudoephedrine HCl (SUDAFED PO) Take 1 tablet by mouth daily as needed (cold symptons). (Patient not taking: Reported on 09/25/2022)      [DISCONTINUED] zolmitriptan (ZOMIG-ZMT) 5 MG disintegrating tablet Take 1 tablet (5 mg total) by mouth daily as needed for migraine. 10 tablet 0   No current facility-administered medications on file prior to visit.     ROS see history of present illness  Objective  Physical Exam Vitals:   09/25/22 1000  BP: 102/74  Pulse: 75  Temp: (!) 97.4 F (36.3 C)  SpO2: 98%    BP Readings from Last 3 Encounters:  09/25/22 102/74  08/22/22 98/60  06/16/22 90/66   Wt Readings from Last 3 Encounters:  09/25/22 101 lb (45.8 kg)  08/22/22 99 lb (44.9 kg)  06/16/22 98 lb 9.6 oz (44.7 kg)    Physical Exam Constitutional:      General: She is not in acute distress.    Appearance: She is not diaphoretic.  HENT:     Right Ear: Tympanic membrane normal.     Left Ear: Tympanic membrane normal.  Cardiovascular:     Rate and Rhythm: Normal rate and regular rhythm.     Heart sounds: Normal heart sounds.  Pulmonary:     Effort: Pulmonary effort is normal.     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  Neurological:     Mental Status: She is alert.      Assessment/Plan: Please see individual problem list.  Acute non-recurrent pansinusitis Assessment & Plan: Symptoms consistent with sinusitis.  She has an allergy to Levaquin so we cannot go that route.  We will trial cefpodoxime 200 mg twice daily and clindamycin 300 mg every 8 hours.  If not improving with this she will let us know we can have her see ENT.  Advised on risk of diarrhea with these medications.  If she develops diarrhea she will let us know.  Orders: -     Cefpodoxime Proxetil; Take 1 tablet (200 mg total) by mouth 2 (two) times daily.  Dispense: 14 tablet; Refill: 0 -     Clindamycin HCl; Take 1 capsule (300 mg total) by mouth 3 (three) times daily.  Dispense: 21 capsule; Refill: 0    Return for As scheduled.   Marikay Alar,  MD [facility]

## 2022-09-25 NOTE — Assessment & Plan Note (Signed)
Symptoms consistent with sinusitis.  She has an allergy to Levaquin so we cannot go that route.  We will trial cefpodoxime 200 mg twice daily and clindamycin 300 mg every 8 hours.  If not improving with this she will let us know we can have her see ENT.  Advised on risk of diarrhea with these medications.  If she develops diarrhea she will let us know.

## 2022-09-25 NOTE — Addendum Note (Signed)
Addended by: Prince Solian A on: 09/25/2022 10:34 AM   Modules accepted: Orders

## 2022-10-19 ENCOUNTER — Ambulatory Visit: Payer: Medicare Other | Admitting: Nurse Practitioner

## 2022-10-23 ENCOUNTER — Encounter: Payer: Self-pay | Admitting: Nurse Practitioner

## 2022-10-23 ENCOUNTER — Ambulatory Visit (INDEPENDENT_AMBULATORY_CARE_PROVIDER_SITE_OTHER): Payer: Medicare Other | Admitting: Nurse Practitioner

## 2022-10-23 ENCOUNTER — Other Ambulatory Visit: Payer: Self-pay | Admitting: Family Medicine

## 2022-10-23 VITALS — BP 110/60 | HR 67 | Temp 97.8°F | Ht 63.0 in | Wt 101.4 lb

## 2022-10-23 DIAGNOSIS — N951 Menopausal and female climacteric states: Secondary | ICD-10-CM

## 2022-10-23 DIAGNOSIS — R21 Rash and other nonspecific skin eruption: Secondary | ICD-10-CM | POA: Diagnosis not present

## 2022-10-23 MED ORDER — PREDNISONE 10 MG PO TABS
ORAL_TABLET | ORAL | 0 refills | Status: DC
Start: 2022-10-23 — End: 2023-04-19

## 2022-10-23 NOTE — Assessment & Plan Note (Signed)
Areas not consistent with poison ivy/oak. Seems more likely bug bite of some type from working outside. Less than 10 spots total. Will treat with Prednisone taper. Counseled on common side effects. Patient can continue Benadryl as needed for itching. She will contact if not improving or worsening.

## 2022-10-23 NOTE — Progress Notes (Signed)
Bethanie Dicker, NP-C Phone: [phone]  Anne [name] is a 72 y.o. female who presents today for rash.  Patient with multiple skin eruptions that began 2-3 weeks ago. Patient states she was working outside pulling weeds and thought she may have gotten poison ivy or poison oak. The areas are pruritic making it difficult for her to sleep. She states they started out blister like then came to a head and popped. They have since scabbed over, mostly from her scratching them. They have not worsened or spread but have not resolved. She has tried taking Benadryl and a Prednisone tablet she had at home which somewhat helped. She denies any new exposures to lotions, soaps, or foods.    Social History   Tobacco Use  Smoking Status Never  Smokeless Tobacco Never    Current Outpatient Medications on File Prior to Visit  Medication Sig Dispense Refill   acetaminophen (TYLENOL) 500 MG tablet Take 1,000 mg by mouth 2 (two) times daily as needed for moderate pain or headache.     albuterol (VENTOLIN HFA) 108 (90 Base) MCG/ACT inhaler Inhale 2 puffs into the lungs every 6 (six) hours as needed for wheezing or shortness of breath. 18 g 3   azelastine (ASTELIN) 0.1 % nasal spray Place 1 spray into both nostrils 2 (two) times daily. Use in each nostril as directed (Patient taking differently: Place 1 spray into both nostrils 2 (two) times daily as needed for allergies. Use in each nostril as directed) 30 mL 6   cefpodoxime (VANTIN) 200 MG tablet Take 1 tablet (200 mg total) by mouth 2 (two) times daily. 14 tablet 0   cetirizine (ZYRTEC) 10 MG tablet TAKE 1 TABLET BY MOUTH EVERY DAY (NOT COVERED 90 tablet 3   clindamycin (CLEOCIN) 300 MG capsule Take 1 capsule (300 mg total) by mouth 3 (three) times daily. 21 capsule 0   diphenhydrAMINE (BENADRYL) 25 MG tablet Take 50 mg by mouth daily as needed for allergies (Asthma).     EPINEPHrine 0.3 mg/0.3 mL IJ SOAJ injection Inject 0.3 mg into the muscle as needed for  anaphylaxis.      estradiol (ESTRACE) 1 MG tablet TAKE 1 TABLET BY MOUTH EVERY DAY 90 tablet 1   Fluticasone-Umeclidin-Vilant (TRELEGY ELLIPTA) 100-62.5-25 MCG/ACT AEPB Inhale 1 puff into the lungs daily. (Patient taking differently: Inhale 1 puff into the lungs daily as needed (asthma).) 60 each 0   furosemide (LASIX) 20 MG tablet TAKE 1 TABLET BY MOUTH EVERY DAY AS NEEDED 90 tablet 1   ibuprofen (ADVIL,MOTRIN) 200 MG tablet Take 400-800 mg by mouth daily as needed for headache or moderate pain.     ipratropium-albuterol (DUONEB) 0.5-2.5 (3) MG/3ML SOLN Take 3 mLs by nebulization every 4 (four) hours as needed. Dx 496 120 mL 2   methocarbamol (ROBAXIN) 500 MG tablet Take 500 mg by mouth every 8 (eight) hours as needed for muscle spasms. (Patient not taking: Reported on 09/25/2022)     montelukast (SINGULAIR) 10 MG tablet TAKE 1 TABLET BY MOUTH EVERYDAY AT BEDTIME 90 tablet 1   ondansetron (ZOFRAN-ODT) 4 MG disintegrating tablet Take 1 tablet (4 mg total) by mouth every 6 (six) hours as needed for nausea or vomiting. 30 tablet 1   oxyCODONE (OXY IR/ROXICODONE) 5 MG immediate release tablet Take 5 mg by mouth 2 (two) times daily as needed for severe pain. (Patient not taking: Reported on 09/25/2022)     pantoprazole (PROTONIX) 40 MG tablet TAKE 1 TABLET BY MOUTH TWICE A  DAY 180 tablet 3   Pseudoephedrine HCl (SUDAFED PO) Take 1 tablet by mouth daily as needed (cold symptons). (Patient not taking: Reported on 09/25/2022)     rizatriptan (MAXALT-MLT) 10 MG disintegrating tablet TAKE 1 TABLET (10 MG TOTAL) BY MOUTH DAILY AS NEEDED FOR MIGRAINE. MAY REPEAT FOR ONE DOSE 2 HOURS AFTER THE FIRST ONE IF NEEDED. 10 tablet 0   sodium chloride (OCEAN) 0.65 % SOLN nasal spray Place 1 spray into both nostrils every 4 (four) hours as needed for congestion.      traMADol (ULTRAM) 50 MG tablet 0.5-1 Tablet(s) By Mouth Twice Daily PRN     White Petrolatum-Mineral Oil (LUBRICANT EYE) OINT Place 1 application into both eyes 2  (two) times daily as needed (for dry eyes).      [DISCONTINUED] zolmitriptan (ZOMIG-ZMT) 5 MG disintegrating tablet Take 1 tablet (5 mg total) by mouth daily as needed for migraine. 10 tablet 0   No current facility-administered medications on file prior to visit.     ROS see history of present illness  Objective  Physical Exam Vitals:   10/23/22 1032  BP: 110/60  Pulse: 67  Temp: 97.8 F (36.6 C)  SpO2: 97%    BP Readings from Last 3 Encounters:  10/23/22 110/60  09/25/22 102/74  08/22/22 98/60   Wt Readings from Last 3 Encounters:  10/23/22 101 lb 6.4 oz (46 kg)  09/25/22 101 lb (45.8 kg)  08/22/22 99 lb (44.9 kg)    Physical Exam Constitutional:      General: She is not in acute distress.    Appearance: Normal appearance.  HENT:     Head: Normocephalic.  Cardiovascular:     Rate and Rhythm: Normal rate and regular rhythm.     Heart sounds: Normal heart sounds.  Pulmonary:     Effort: Pulmonary effort is normal.     Breath sounds: Normal breath sounds.  Skin:    General: Skin is warm and dry.     Findings: Lesion present.     Comments: 1 spot on left breast, 2 spots on right shoulder, 3 spots across back, 1 spot on left cheek - all small scabbed over areas. No erythema. No drainage. No swelling.   Neurological:     General: No focal deficit present.     Mental Status: She is alert.  Psychiatric:        Mood and Affect: Mood normal.        Behavior: Behavior normal.    Assessment/Plan: Please see individual problem list.  Rash and nonspecific skin eruption Assessment & Plan: Areas not consistent with poison ivy/oak. Seems more likely bug bite of some type from working outside. Less than 10 spots total. Will treat with Prednisone taper. Counseled on common side effects. Patient can continue Benadryl as needed for itching. She will contact if not improving or worsening.   Orders: -     predniSONE; TAKE 3 TABLETS PO QD FOR 3 DAYS THEN TAKE 2 TABLETS PO QD  FOR 3 DAYS THEN TAKE 1 TABLET PO QD FOR 3 DAYS THEN TAKE 1/2 TAB PO QD FOR 3 DAYS  Dispense: 20 tablet; Refill: 0   Return if symptoms worsen or fail to improve.   Bethanie Dicker, NP-C [facility]

## 2022-10-24 NOTE — Telephone Encounter (Signed)
Error

## 2022-10-26 ENCOUNTER — Ambulatory Visit (INDEPENDENT_AMBULATORY_CARE_PROVIDER_SITE_OTHER): Payer: Medicare Other

## 2022-10-26 DIAGNOSIS — E538 Deficiency of other specified B group vitamins: Secondary | ICD-10-CM

## 2022-10-26 MED ORDER — CYANOCOBALAMIN 1000 MCG/ML IJ SOLN
1000.0000 ug | Freq: Once | INTRAMUSCULAR | Status: AC
Start: 2022-10-26 — End: 2022-10-26
  Administered 2022-10-26: 1000 ug via INTRAMUSCULAR

## 2022-10-26 NOTE — Progress Notes (Signed)
Patient arrived for a B12 injection and it was administered into her left deltoid. Patient tolerated the injection well and did not show any signs of distress or voice any concerns. 

## 2022-11-09 ENCOUNTER — Encounter (INDEPENDENT_AMBULATORY_CARE_PROVIDER_SITE_OTHER): Payer: Self-pay

## 2022-11-14 ENCOUNTER — Ambulatory Visit (INDEPENDENT_AMBULATORY_CARE_PROVIDER_SITE_OTHER): Payer: Medicare Other | Admitting: Family Medicine

## 2022-11-14 ENCOUNTER — Encounter: Payer: Self-pay | Admitting: Family Medicine

## 2022-11-14 VITALS — BP 124/82 | HR 74 | Temp 98.1°F | Ht 63.0 in | Wt 100.4 lb

## 2022-11-14 DIAGNOSIS — J0140 Acute pansinusitis, unspecified: Secondary | ICD-10-CM | POA: Diagnosis not present

## 2022-11-14 DIAGNOSIS — N3000 Acute cystitis without hematuria: Secondary | ICD-10-CM

## 2022-11-14 DIAGNOSIS — N390 Urinary tract infection, site not specified: Secondary | ICD-10-CM | POA: Insufficient documentation

## 2022-11-14 DIAGNOSIS — J4530 Mild persistent asthma, uncomplicated: Secondary | ICD-10-CM | POA: Diagnosis not present

## 2022-11-14 LAB — URINALYSIS, MICROSCOPIC ONLY

## 2022-11-14 LAB — POC URINALSYSI DIPSTICK (AUTOMATED)
Glucose, UA: NEGATIVE
Nitrite, UA: POSITIVE
Protein, UA: POSITIVE — AB
Spec Grav, UA: 1.015 (ref 1.010–1.025)
Urobilinogen, UA: >=8 E.U./dL — AB
pH, UA: 5 (ref 5.0–8.0)

## 2022-11-14 LAB — POC COVID19 BINAXNOW: SARS Coronavirus 2 Ag: NEGATIVE

## 2022-11-14 MED ORDER — AMOXICILLIN-POT CLAVULANATE 875-125 MG PO TABS
1.0000 | ORAL_TABLET | Freq: Two times a day (BID) | ORAL | 0 refills | Status: DC
Start: 2022-11-14 — End: 2023-01-12

## 2022-11-14 NOTE — Assessment & Plan Note (Signed)
This is a chronic ongoing issue.  It does not sound as if she is completely resolved though this certainly could represent a new infection.  We are going to treat with Augmentin 1 pill twice daily.  She will see ENT tomorrow to get their input.

## 2022-11-14 NOTE — Assessment & Plan Note (Signed)
Chronic issue.  Generally well-controlled.  She will continue the nebulized Trelegy once daily.  Seems to be tolerating her current illness well with regards to her asthma.

## 2022-11-14 NOTE — Progress Notes (Signed)
Marikay Alar, MD Phone: [phone]  Anne [name] is a 72 y.o. female who presents today for f/u.  Sinusitis: Patient continues to have symptoms from this.  She notes she got quite a bit better though 3 days ago her symptoms returned.  She has cough and congestion in her head.  She had itchy/sore throat.  Some fatigue.  She notes 10 to 12 days ago she was exposed to somebody with COVID though she reports multiple negative COVID test prior to her getting sick.  She did take a COVID test the day she started to feel worse and that was negative.  Asthma: Continues to use Trelegy nebulizer and has been using her inhalers acutely with this illness.  Notes no shortness of breath.  UTI: Patient feels that she has a UTI.  She has some lower abdominal discomfort as well as dysuria, urinary frequency, and urgency.  No hematuria.  She has been taking Azo.  Social History   Tobacco Use  Smoking Status Never  Smokeless Tobacco Never    Current Outpatient Medications on File Prior to Visit  Medication Sig Dispense Refill   acetaminophen (TYLENOL) 500 MG tablet Take 1,000 mg by mouth 2 (two) times daily as needed for moderate pain or headache.     albuterol (VENTOLIN HFA) 108 (90 Base) MCG/ACT inhaler Inhale 2 puffs into the lungs every 6 (six) hours as needed for wheezing or shortness of breath. 18 g 3   azelastine (ASTELIN) 0.1 % nasal spray Place 1 spray into both nostrils 2 (two) times daily. Use in each nostril as directed (Patient taking differently: Place 1 spray into both nostrils 2 (two) times daily as needed for allergies. Use in each nostril as directed) 30 mL 6   cetirizine (ZYRTEC) 10 MG tablet TAKE 1 TABLET BY MOUTH EVERY DAY (NOT COVERED 90 tablet 3   diphenhydrAMINE (BENADRYL) 25 MG tablet Take 50 mg by mouth daily as needed for allergies (Asthma).     EPINEPHrine 0.3 mg/0.3 mL IJ SOAJ injection Inject 0.3 mg into the muscle as needed for anaphylaxis.      estradiol (ESTRACE) 1 MG  tablet TAKE 1 TABLET BY MOUTH EVERY DAY 90 tablet 1   Fluticasone-Umeclidin-Vilant (TRELEGY ELLIPTA) 100-62.5-25 MCG/ACT AEPB Inhale 1 puff into the lungs daily. (Patient taking differently: Inhale 1 puff into the lungs daily as needed (asthma).) 60 each 0   furosemide (LASIX) 20 MG tablet TAKE 1 TABLET BY MOUTH EVERY DAY AS NEEDED 90 tablet 1   ibuprofen (ADVIL,MOTRIN) 200 MG tablet Take 400-800 mg by mouth daily as needed for headache or moderate pain.     ipratropium-albuterol (DUONEB) 0.5-2.5 (3) MG/3ML SOLN Take 3 mLs by nebulization every 4 (four) hours as needed. Dx 496 120 mL 2   methocarbamol (ROBAXIN) 500 MG tablet Take 500 mg by mouth every 8 (eight) hours as needed for muscle spasms.     montelukast (SINGULAIR) 10 MG tablet TAKE 1 TABLET BY MOUTH EVERYDAY AT BEDTIME 90 tablet 1   ondansetron (ZOFRAN-ODT) 4 MG disintegrating tablet Take 1 tablet (4 mg total) by mouth every 6 (six) hours as needed for nausea or vomiting. 30 tablet 1   oxyCODONE (OXY IR/ROXICODONE) 5 MG immediate release tablet Take 5 mg by mouth 2 (two) times daily as needed for severe pain.     pantoprazole (PROTONIX) 40 MG tablet TAKE 1 TABLET BY MOUTH TWICE A DAY 180 tablet 3   predniSONE (DELTASONE) 10 MG tablet TAKE 3 TABLETS PO QD  FOR 3 DAYS THEN TAKE 2 TABLETS PO QD FOR 3 DAYS THEN TAKE 1 TABLET PO QD FOR 3 DAYS THEN TAKE 1/2 TAB PO QD FOR 3 DAYS 20 tablet 0   Pseudoephedrine HCl (SUDAFED PO) Take 1 tablet by mouth daily as needed (cold symptons).     rizatriptan (MAXALT-MLT) 10 MG disintegrating tablet TAKE 1 TABLET (10 MG TOTAL) BY MOUTH DAILY AS NEEDED FOR MIGRAINE. MAY REPEAT FOR ONE DOSE 2 HOURS AFTER THE FIRST ONE IF NEEDED. 10 tablet 0   sodium chloride (OCEAN) 0.65 % SOLN nasal spray Place 1 spray into both nostrils every 4 (four) hours as needed for congestion.      traMADol (ULTRAM) 50 MG tablet 0.5-1 Tablet(s) By Mouth Twice Daily PRN     White Petrolatum-Mineral Oil (LUBRICANT EYE) OINT Place 1 application  into both eyes 2 (two) times daily as needed (for dry eyes).      [DISCONTINUED] zolmitriptan (ZOMIG-ZMT) 5 MG disintegrating tablet Take 1 tablet (5 mg total) by mouth daily as needed for migraine. 10 tablet 0   No current facility-administered medications on file prior to visit.     ROS see history of present illness  Objective  Physical Exam Vitals:   11/14/22 0830  BP: 124/82  Pulse: 74  Temp: 98.1 F (36.7 C)  SpO2: 97%    BP Readings from Last 3 Encounters:  11/14/22 124/82  10/23/22 110/60  09/25/22 102/74   Wt Readings from Last 3 Encounters:  11/14/22 100 lb 6.4 oz (45.5 kg)  10/23/22 101 lb 6.4 oz (46 kg)  09/25/22 101 lb (45.8 kg)    Physical Exam Constitutional:      General: She is not in acute distress.    Appearance: She is not diaphoretic.  Cardiovascular:     Rate and Rhythm: Normal rate and regular rhythm.     Heart sounds: Normal heart sounds.  Pulmonary:     Effort: Pulmonary effort is normal.     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 There is no distension.     Palpations: Abdomen is soft.     Tenderness: There is abdominal tenderness (Slight suprapubic discomfort on palpation).  Skin:    General: Skin is warm and dry.  Neurological:     Mental Status: She is alert.      Assessment/Plan: Please see individual problem list.  Mild persistent asthma without complication Assessment & Plan: Chronic issue.  Generally well-controlled.  She will continue the nebulized Trelegy once daily.  Seems to be tolerating her current illness well with regards to her asthma.   Acute non-recurrent pansinusitis Assessment & Plan: This is a chronic ongoing issue.  It does not sound as if she is completely resolved though this certainly could represent a new infection.  We are going to treat with Augmentin 1 pill twice  daily.  She will see ENT tomorrow to get their input.  Orders: -     POC COVID-19 BinaxNow -     Amoxicillin-Pot Clavulanate; Take 1 tablet by mouth 2 (two) times daily.  Dispense: 14 tablet; Refill: 0  Acute cystitis without hematuria Assessment & Plan: Symptoms are concerning for UTI.  Urinalysis is pan positive given use of Azo.  We will treat with Augmentin 1 tablet twice daily.  Will send urine for culture.  Orders: -  Urine Culture -     Amoxicillin-Pot Clavulanate; Take 1 tablet by mouth 2 (two) times daily.  Dispense: 14 tablet; Refill: 0 -     POCT Urinalysis Dipstick (Automated)     Return in about 6 months (around 05/17/2023).   Marikay Alar, MD [facility]

## 2022-11-14 NOTE — Patient Instructions (Signed)
Nice to see you. Will treat your UTI with Augmentin.  This should also cover for sinus infection.  Please see ENT tomorrow to get their input as well given your persistent sinus issues.

## 2022-11-14 NOTE — Assessment & Plan Note (Signed)
Symptoms are concerning for UTI.  Urinalysis is pan positive given use of Azo.  We will treat with Augmentin 1 tablet twice daily.  Will send urine for culture.

## 2022-11-16 LAB — URINE CULTURE
MICRO NUMBER:: 15355780
SPECIMEN QUALITY:: ADEQUATE

## 2022-11-28 ENCOUNTER — Telehealth: Payer: Self-pay

## 2022-11-28 ENCOUNTER — Ambulatory Visit (INDEPENDENT_AMBULATORY_CARE_PROVIDER_SITE_OTHER): Payer: Medicare Other

## 2022-11-28 DIAGNOSIS — E538 Deficiency of other specified B group vitamins: Secondary | ICD-10-CM

## 2022-11-28 MED ORDER — CYANOCOBALAMIN 1000 MCG/ML IJ SOLN
1000.0000 ug | Freq: Once | INTRAMUSCULAR | Status: AC
Start: 2022-11-28 — End: 2022-11-28
  Administered 2022-11-28: 1000 ug via INTRAMUSCULAR

## 2022-11-28 NOTE — Progress Notes (Signed)
Patient presented for B 12 injection to right deltoid, patient voiced no concerns nor showed any signs of distress during injection. 

## 2022-11-28 NOTE — Telephone Encounter (Signed)
Nurse visit note

## 2022-11-29 NOTE — Telephone Encounter (Signed)
Can you find out why she needs the Magic mouthwash?  Thanks.

## 2022-12-04 ENCOUNTER — Ambulatory Visit: Payer: Medicare Other | Admitting: Family Medicine

## 2022-12-06 ENCOUNTER — Telehealth: Payer: Self-pay | Admitting: Family Medicine

## 2022-12-06 ENCOUNTER — Ambulatory Visit: Payer: Medicare Other | Admitting: Family Medicine

## 2022-12-06 DIAGNOSIS — R3129 Other microscopic hematuria: Secondary | ICD-10-CM

## 2022-12-06 NOTE — Telephone Encounter (Signed)
Urine test ordered.

## 2022-12-06 NOTE — Telephone Encounter (Signed)
Patient need lab orders.

## 2022-12-13 ENCOUNTER — Other Ambulatory Visit: Payer: Medicare Other

## 2022-12-13 DIAGNOSIS — R3129 Other microscopic hematuria: Secondary | ICD-10-CM

## 2022-12-15 ENCOUNTER — Telehealth: Payer: Self-pay | Admitting: Family Medicine

## 2022-12-15 DIAGNOSIS — R300 Dysuria: Secondary | ICD-10-CM

## 2022-12-15 DIAGNOSIS — R3129 Other microscopic hematuria: Secondary | ICD-10-CM

## 2022-12-15 NOTE — Telephone Encounter (Signed)
Specimen was sent to lab on 9/18, & we did not receive a call or a message notifying us the it was a QNS.  I will reorder for pt to recollect when she return from her trip.  Pt states that this was just a follow-up urine and she was not having any symptoms.

## 2022-12-15 NOTE — Telephone Encounter (Signed)
The patient called requesting her urinalysis results. I followed up with our team leader in the lab, she called Clydie Braun at [facility] lab. She informed our Team lead that it was not enough specimen to run to be resulted. I explained that to the patient , she stated she will follow up after she comes from out of town in two weeks.

## 2022-12-18 ENCOUNTER — Ambulatory Visit: Payer: Medicare Other | Admitting: Family Medicine

## 2022-12-18 NOTE — Telephone Encounter (Signed)
Noted  

## 2022-12-20 ENCOUNTER — Ambulatory Visit: Payer: Medicare Other | Admitting: Family Medicine

## 2023-01-05 ENCOUNTER — Ambulatory Visit: Payer: Medicare Other

## 2023-01-09 ENCOUNTER — Encounter: Payer: Self-pay | Admitting: Family Medicine

## 2023-01-09 ENCOUNTER — Ambulatory Visit: Payer: Medicare Other | Admitting: Family Medicine

## 2023-01-09 ENCOUNTER — Ambulatory Visit (INDEPENDENT_AMBULATORY_CARE_PROVIDER_SITE_OTHER): Payer: Medicare Other

## 2023-01-09 VITALS — BP 114/68 | HR 108 | Temp 97.9°F | Ht 63.0 in | Wt 99.0 lb

## 2023-01-09 DIAGNOSIS — Z79890 Hormone replacement therapy (postmenopausal): Secondary | ICD-10-CM

## 2023-01-09 DIAGNOSIS — Z1152 Encounter for screening for COVID-19: Secondary | ICD-10-CM | POA: Diagnosis not present

## 2023-01-09 DIAGNOSIS — R059 Cough, unspecified: Secondary | ICD-10-CM

## 2023-01-09 DIAGNOSIS — E538 Deficiency of other specified B group vitamins: Secondary | ICD-10-CM | POA: Diagnosis not present

## 2023-01-09 DIAGNOSIS — J4530 Mild persistent asthma, uncomplicated: Secondary | ICD-10-CM

## 2023-01-09 LAB — POC COVID19 BINAXNOW: SARS Coronavirus 2 Ag: NEGATIVE

## 2023-01-09 MED ORDER — PREDNISONE 20 MG PO TABS
40.0000 mg | ORAL_TABLET | Freq: Every day | ORAL | 0 refills | Status: DC
Start: 2023-01-09 — End: 2023-04-19

## 2023-01-09 MED ORDER — GUAIFENESIN-CODEINE 100-10 MG/5ML PO SOLN
5.0000 mL | Freq: Three times a day (TID) | ORAL | 0 refills | Status: DC | PRN
Start: 2023-01-09 — End: 2023-01-31

## 2023-01-09 MED ORDER — CYANOCOBALAMIN 1000 MCG/ML IJ SOLN
1000.0000 ug | Freq: Once | INTRAMUSCULAR | Status: AC
Start: 2023-01-09 — End: 2023-01-09
  Administered 2023-01-09: 1000 ug via INTRAMUSCULAR

## 2023-01-09 NOTE — Progress Notes (Signed)
Anne Alar, MD Phone: [phone]  Anne [name] is a 72 y.o. female who presents today for follow-up.  Hormone replacement therapy: Patient continues on Estrace.  She is try to come off of this in the past and not been effective.  She does note a maternal grandmother with breast cancer.  She notes no personal history of breast cancer.  She is status post hysterectomy.  No history of stroke or heart attack or blood clots.  Asthma: Patient notes Trelegy feels as though it burns her airway.  She is not sure how much it helps.  She follows up with pulmonology in November.  Patient does note each time she gets prednisone her breathing and chest improved.  COVID-19: Patient notes she had COVID-19 diagnosed on 9/20.  She took Paxlovid and finished that in late September.  About 5 days ago she developed additional cough.  She has lack of taste or smell.  She took a dose of prednisone last night.  She did a home COVID test 2 days ago that was negative.  Social History   Tobacco Use  Smoking Status Never  Smokeless Tobacco Never    Current Outpatient Medications on File Prior to Visit  Medication Sig Dispense Refill   acetaminophen (TYLENOL) 500 MG tablet Take 1,000 mg by mouth 2 (two) times daily as needed for moderate pain or headache.     albuterol (VENTOLIN HFA) 108 (90 Base) MCG/ACT inhaler Inhale 2 puffs into the lungs every 6 (six) hours as needed for wheezing or shortness of breath. 18 g 3   amoxicillin-clavulanate (AUGMENTIN) 875-125 MG tablet Take 1 tablet by mouth 2 (two) times daily. 14 tablet 0   azelastine (ASTELIN) 0.1 % nasal spray Place 1 spray into both nostrils 2 (two) times daily. Use in each nostril as directed (Patient taking differently: Place 1 spray into both nostrils 2 (two) times daily as needed for allergies. Use in each nostril as directed) 30 mL 6   cetirizine (ZYRTEC) 10 MG tablet TAKE 1 TABLET BY MOUTH EVERY DAY (NOT COVERED 90 tablet 3   diphenhydrAMINE  (BENADRYL) 25 MG tablet Take 50 mg by mouth daily as needed for allergies (Asthma).     EPINEPHrine 0.3 mg/0.3 mL IJ SOAJ injection Inject 0.3 mg into the muscle as needed for anaphylaxis.      estradiol (ESTRACE) 1 MG tablet TAKE 1 TABLET BY MOUTH EVERY DAY 90 tablet 1   Fluticasone-Umeclidin-Vilant (TRELEGY ELLIPTA) 100-62.5-25 MCG/ACT AEPB Inhale 1 puff into the lungs daily. (Patient taking differently: Inhale 1 puff into the lungs daily as needed (asthma).) 60 each 0   furosemide (LASIX) 20 MG tablet TAKE 1 TABLET BY MOUTH EVERY DAY AS NEEDED 90 tablet 1   ibuprofen (ADVIL,MOTRIN) 200 MG tablet Take 400-800 mg by mouth daily as needed for headache or moderate pain.     ipratropium-albuterol (DUONEB) 0.5-2.5 (3) MG/3ML SOLN Take 3 mLs by nebulization every 4 (four) hours as needed. Dx 496 120 mL 2   methocarbamol (ROBAXIN) 500 MG tablet Take 500 mg by mouth every 8 (eight) hours as needed for muscle spasms.     montelukast (SINGULAIR) 10 MG tablet TAKE 1 TABLET BY MOUTH EVERYDAY AT BEDTIME 90 tablet 1   ondansetron (ZOFRAN-ODT) 4 MG disintegrating tablet Take 1 tablet (4 mg total) by mouth every 6 (six) hours as needed for nausea or vomiting. 30 tablet 1   pantoprazole (PROTONIX) 40 MG tablet TAKE 1 TABLET BY MOUTH TWICE A DAY 180 tablet 3  predniSONE (DELTASONE) 10 MG tablet TAKE 3 TABLETS PO QD FOR 3 DAYS THEN TAKE 2 TABLETS PO QD FOR 3 DAYS THEN TAKE 1 TABLET PO QD FOR 3 DAYS THEN TAKE 1/2 TAB PO QD FOR 3 DAYS 20 tablet 0   Pseudoephedrine HCl (SUDAFED PO) Take 1 tablet by mouth daily as needed (cold symptons).     rizatriptan (MAXALT-MLT) 10 MG disintegrating tablet TAKE 1 TABLET (10 MG TOTAL) BY MOUTH DAILY AS NEEDED FOR MIGRAINE. MAY REPEAT FOR ONE DOSE 2 HOURS AFTER THE FIRST ONE IF NEEDED. 10 tablet 0   sodium chloride (OCEAN) 0.65 % SOLN nasal spray Place 1 spray into both nostrils every 4 (four) hours as needed for congestion.      White Petrolatum-Mineral Oil (LUBRICANT EYE) OINT Place  1 application into both eyes 2 (two) times daily as needed (for dry eyes).      [DISCONTINUED] zolmitriptan (ZOMIG-ZMT) 5 MG disintegrating tablet Take 1 tablet (5 mg total) by mouth daily as needed for migraine. 10 tablet 0   No current facility-administered medications on file prior to visit.     ROS see history of present illness  Objective  Physical Exam Vitals:   01/09/23 0909  BP: 114/68  Pulse: (!) 108  Temp: 97.9 F (36.6 C)  SpO2: 98%    BP Readings from Last 3 Encounters:  01/09/23 114/68  11/14/22 124/82  10/23/22 110/60   Wt Readings from Last 3 Encounters:  01/09/23 99 lb (44.9 kg)  11/14/22 100 lb 6.4 oz (45.5 kg)  10/23/22 101 lb 6.4 oz (46 kg)    Physical Exam Constitutional:      General: She is not in acute distress.    Appearance: She is not diaphoretic.  Cardiovascular:     Rate and Rhythm: Normal rate and regular rhythm.     Heart sounds: Normal heart sounds.  Pulmonary:     Effort: Pulmonary effort is normal.     Comments: Upper airway wheezing sound noted from prior tracheostomy site that has been repaired Skin:    General: Skin is warm and dry.  Neurological:     Mental Status: She is alert.      Assessment/Plan: Please see individual problem list.  Cough, unspecified type Assessment & Plan: Suspect viral illness.  COVID test today in the office negative.  It would be very unlikely for her to have COVID so quick after her most recent COVID diagnosis.  We will treat with prednisone 40 mg daily for 5 days.  She will be prescribed codeine cough syrup to use for cough.  She was counseled on the risk of drowsiness with this medication and advised to discontinue use of it if she has excessive drowsiness.  Chest x-ray to be completed today.  Orders: -     POC COVID-19 BinaxNow -     predniSONE; Take 2 tablets (40 mg total) by mouth daily with breakfast.  Dispense: 10 tablet; Refill: 0 -     guaiFENesin-Codeine; Take 5 mLs by mouth 3 (three)  times daily as needed for cough.  Dispense: 120 mL; Refill: 0 -     DG Chest 2 View; Future  Encounter for screening for COVID-19 -     POC COVID-19 BinaxNow  Mild persistent asthma without complication Assessment & Plan: Chronic issue.  Undetermined control.  She will continue Trelegy once daily.  She will discuss long-term treatments with her pulmonologist when she follows up with them.   Current long-term use of postmenopausal  hormone replacement therapy Assessment & Plan: Chronic issue.  Reinforced the potential long-term risks of this medication including stroke, heart attack, cancer, and blood clot.  It is likely that the patient is benefiting more from this than the risk of her taking it at this time.  Discussed if she developed any of the potential long-term risks we would have to discontinue this medication.  She will be evaluated right away if she develops any signs of stroke, heart attack, blood clot.   B12 deficiency -     Cyanocobalamin     Return in about 6 months (around 07/10/2023) for transfer of care.   Anne Alar, MD [facility]

## 2023-01-09 NOTE — Patient Instructions (Addendum)
Nice to see you. Will treat your cough with codeine cough syrup.  If you drowsy with this please let us know and please discontinue use of it. Will contact you with your chest x-ray result. If you develop significantly worsening symptoms please seek medical attention immediately.

## 2023-01-09 NOTE — Assessment & Plan Note (Signed)
Chronic issue.  Undetermined control.  She will continue Trelegy once daily.  She will discuss long-term treatments with her pulmonologist when she follows up with them.

## 2023-01-09 NOTE — Assessment & Plan Note (Signed)
Chronic issue.  Reinforced the potential long-term risks of this medication including stroke, heart attack, cancer, and blood clot.  It is likely that the patient is benefiting more from this than the risk of her taking it at this time.  Discussed if she developed any of the potential long-term risks we would have to discontinue this medication.  She will be evaluated right away if she develops any signs of stroke, heart attack, blood clot.

## 2023-01-09 NOTE — Assessment & Plan Note (Addendum)
Suspect viral illness.  COVID test today in the office negative.  It would be very unlikely for her to have COVID so quick after her most recent COVID diagnosis.  We will treat with prednisone 40 mg daily for 5 days.  She will be prescribed codeine cough syrup to use for cough.  She was counseled on the risk of drowsiness with this medication and advised to discontinue use of it if she has excessive drowsiness.  Chest x-ray to be completed today.

## 2023-01-12 ENCOUNTER — Ambulatory Visit (INDEPENDENT_AMBULATORY_CARE_PROVIDER_SITE_OTHER): Payer: Medicare Other | Admitting: Nurse Practitioner

## 2023-01-12 ENCOUNTER — Encounter: Payer: Self-pay | Admitting: Nurse Practitioner

## 2023-01-12 VITALS — BP 110/60 | HR 70 | Temp 97.7°F | Ht 63.0 in | Wt 100.0 lb

## 2023-01-12 DIAGNOSIS — R059 Cough, unspecified: Secondary | ICD-10-CM | POA: Diagnosis not present

## 2023-01-12 DIAGNOSIS — R300 Dysuria: Secondary | ICD-10-CM

## 2023-01-12 DIAGNOSIS — N3000 Acute cystitis without hematuria: Secondary | ICD-10-CM

## 2023-01-12 LAB — URINALYSIS, ROUTINE W REFLEX MICROSCOPIC
Bilirubin Urine: NEGATIVE
Ketones, ur: NEGATIVE
Nitrite: POSITIVE — AB
Specific Gravity, Urine: 1.02 (ref 1.000–1.030)
Urine Glucose: NEGATIVE
Urobilinogen, UA: 1 (ref 0.0–1.0)
pH: 6 (ref 5.0–8.0)

## 2023-01-12 LAB — POC URINALSYSI DIPSTICK (AUTOMATED)
Bilirubin, UA: NEGATIVE
Glucose, UA: NEGATIVE
Nitrite, UA: POSITIVE
Protein, UA: POSITIVE — AB
Spec Grav, UA: 1.025 (ref 1.010–1.025)
Urobilinogen, UA: 2 U/dL — AB
pH, UA: 6 (ref 5.0–8.0)

## 2023-01-12 MED ORDER — AMOXICILLIN-POT CLAVULANATE 875-125 MG PO TABS: 1.0000 | ORAL_TABLET | Freq: Two times a day (BID) | ORAL | 0 refills | Status: DC

## 2023-01-12 NOTE — Assessment & Plan Note (Addendum)
She will continue her Prednisone as prescribed. She has cough syrup and inhalers to use at home as needed. Treating for cystitis with Augmentin 875 BID x 10 days which will cover if cough is bacterial. She will follow up with her Pulmonologist next week as scheduled. Strict precautions given to patient.

## 2023-01-12 NOTE — Progress Notes (Signed)
Bethanie Dicker, NP-C Phone: [phone]  Anne [name] is a 72 y.o. female who presents today for cough and dysuria.   Cough- Patient evaluated on 01/09/2023 for cough and treated with Prednisone and cough syrup. She had a chest x-ray that was negative for pneumonia. She presents today continuing to cough, though she does note that she is feeling better. She feels that the Prednisone is helping. She has 2 days left of her medication. She is scheduled to follow up with her Pulmonologist on 01/17/2023. She has been using her inhalers as directed.   UTI:  Dysuria- Yes  Frequency- Yes   Urgency- Yes   Hematuria- No   Fever- No  Abd pain- No   Vaginal d/c- No   Social History   Tobacco Use  Smoking Status Never  Smokeless Tobacco Never    Current Outpatient Medications on File Prior to Visit  Medication Sig Dispense Refill   acetaminophen (TYLENOL) 500 MG tablet Take 1,000 mg by mouth 2 (two) times daily as needed for moderate pain or headache.     albuterol (VENTOLIN HFA) 108 (90 Base) MCG/ACT inhaler Inhale 2 puffs into the lungs every 6 (six) hours as needed for wheezing or shortness of breath. 18 g 3   azelastine (ASTELIN) 0.1 % nasal spray Place 1 spray into both nostrils 2 (two) times daily. Use in each nostril as directed (Patient taking differently: Place 1 spray into both nostrils 2 (two) times daily as needed for allergies. Use in each nostril as directed) 30 mL 6   cetirizine (ZYRTEC) 10 MG tablet TAKE 1 TABLET BY MOUTH EVERY DAY (NOT COVERED 90 tablet 3   diphenhydrAMINE (BENADRYL) 25 MG tablet Take 50 mg by mouth daily as needed for allergies (Asthma).     EPINEPHrine 0.3 mg/0.3 mL IJ SOAJ injection Inject 0.3 mg into the muscle as needed for anaphylaxis.      estradiol (ESTRACE) 1 MG tablet TAKE 1 TABLET BY MOUTH EVERY DAY 90 tablet 1   Fluticasone-Umeclidin-Vilant (TRELEGY ELLIPTA) 100-62.5-25 MCG/ACT AEPB Inhale 1 puff into the lungs daily. (Patient taking differently:  Inhale 1 puff into the lungs daily as needed (asthma).) 60 each 0   furosemide (LASIX) 20 MG tablet TAKE 1 TABLET BY MOUTH EVERY DAY AS NEEDED 90 tablet 1   guaiFENesin-codeine 100-10 MG/5ML syrup Take 5 mLs by mouth 3 (three) times daily as needed for cough. 120 mL 0   ibuprofen (ADVIL,MOTRIN) 200 MG tablet Take 400-800 mg by mouth daily as needed for headache or moderate pain.     ipratropium-albuterol (DUONEB) 0.5-2.5 (3) MG/3ML SOLN Take 3 mLs by nebulization every 4 (four) hours as needed. Dx 496 120 mL 2   methocarbamol (ROBAXIN) 500 MG tablet Take 500 mg by mouth every 8 (eight) hours as needed for muscle spasms.     montelukast (SINGULAIR) 10 MG tablet TAKE 1 TABLET BY MOUTH EVERYDAY AT BEDTIME 90 tablet 1   ondansetron (ZOFRAN-ODT) 4 MG disintegrating tablet Take 1 tablet (4 mg total) by mouth every 6 (six) hours as needed for nausea or vomiting. 30 tablet 1   pantoprazole (PROTONIX) 40 MG tablet TAKE 1 TABLET BY MOUTH TWICE A DAY 180 tablet 3   predniSONE (DELTASONE) 10 MG tablet TAKE 3 TABLETS PO QD FOR 3 DAYS THEN TAKE 2 TABLETS PO QD FOR 3 DAYS THEN TAKE 1 TABLET PO QD FOR 3 DAYS THEN TAKE 1/2 TAB PO QD FOR 3 DAYS 20 tablet 0   predniSONE (DELTASONE) 20 MG  tablet Take 2 tablets (40 mg total) by mouth daily with breakfast. 10 tablet 0   Pseudoephedrine HCl (SUDAFED PO) Take 1 tablet by mouth daily as needed (cold symptons).     rizatriptan (MAXALT-MLT) 10 MG disintegrating tablet TAKE 1 TABLET (10 MG TOTAL) BY MOUTH DAILY AS NEEDED FOR MIGRAINE. MAY REPEAT FOR ONE DOSE 2 HOURS AFTER THE FIRST ONE IF NEEDED. 10 tablet 0   sodium chloride (OCEAN) 0.65 % SOLN nasal spray Place 1 spray into both nostrils every 4 (four) hours as needed for congestion.      White Petrolatum-Mineral Oil (LUBRICANT EYE) OINT Place 1 application into both eyes 2 (two) times daily as needed (for dry eyes).      [DISCONTINUED] zolmitriptan (ZOMIG-ZMT) 5 MG disintegrating tablet Take 1 tablet (5 mg total) by mouth  daily as needed for migraine. 10 tablet 0   No current facility-administered medications on file prior to visit.    ROS see history of present illness  Objective  Physical Exam Vitals:   01/12/23 0849  BP: 110/60  Pulse: 70  Temp: 97.7 F (36.5 C)  SpO2: 98%    BP Readings from Last 3 Encounters:  01/12/23 110/60  01/09/23 114/68  11/14/22 124/82   Wt Readings from Last 3 Encounters:  01/12/23 100 lb (45.4 kg)  01/09/23 99 lb (44.9 kg)  11/14/22 100 lb 6.4 oz (45.5 kg)    Physical Exam Constitutional:      General: She is not in acute distress.    Appearance: Normal appearance.  HENT:     Head: Normocephalic.  Cardiovascular:     Rate and Rhythm: Normal rate and regular rhythm.     Heart sounds: Normal heart sounds.  Pulmonary:     Effort: Pulmonary effort is normal.     Breath sounds: Wheezing (global) present.  Abdominal:     General: Abdomen is flat. Bowel sounds are normal.     Palpations: Abdomen is soft.     Tenderness: There is no abdominal tenderness.  Skin:    General: Skin is warm and dry.  Neurological:     General: No focal deficit present.     Mental Status: She is alert.  Psychiatric:        Mood and Affect: Mood normal.        Behavior: Behavior normal.    Assessment/Plan: Please see individual problem list.  Acute cystitis without hematuria Assessment & Plan: UA in office with large leukocytes, positive for nitrates and moderate blood. Microscopic and culture pending. Will contact patient with results. Will treat with Augmentin 875 BID x 10 days. Encouraged adequate fluid intake. Return precautions given to patient.   Orders: -     Amoxicillin-Pot Clavulanate; Take 1 tablet by mouth 2 (two) times daily.  Dispense: 20 tablet; Refill: 0  Cough, unspecified type Assessment & Plan: She will continue her Prednisone as prescribed. She has cough syrup and inhalers to use at home as needed. Treating for cystitis with Augmentin 875 BID x 10  days which will cover if cough is bacterial. She will follow up with her Pulmonologist next week as scheduled. Strict precautions given to patient.   Orders: -     Amoxicillin-Pot Clavulanate; Take 1 tablet by mouth 2 (two) times daily.  Dispense: 20 tablet; Refill: 0  Dysuria -     POCT Urinalysis Dipstick (Automated) -     Urinalysis, Routine w reflex microscopic -     Urine Culture    Return  if symptoms worsen or fail to improve.   Bethanie Dicker, NP-C [facility] [employer]

## 2023-01-12 NOTE — Assessment & Plan Note (Signed)
UA in office with large leukocytes, positive for nitrates and moderate blood. Microscopic and culture pending. Will contact patient with results. Will treat with Augmentin 875 BID x 10 days. Encouraged adequate fluid intake. Return precautions given to patient.

## 2023-01-15 LAB — URINE CULTURE
MICRO NUMBER:: 15613812
SPECIMEN QUALITY:: ADEQUATE

## 2023-01-21 ENCOUNTER — Encounter: Payer: Self-pay | Admitting: Nurse Practitioner

## 2023-01-21 DIAGNOSIS — E876 Hypokalemia: Secondary | ICD-10-CM

## 2023-01-21 DIAGNOSIS — N3000 Acute cystitis without hematuria: Secondary | ICD-10-CM

## 2023-01-23 ENCOUNTER — Other Ambulatory Visit (INDEPENDENT_AMBULATORY_CARE_PROVIDER_SITE_OTHER): Payer: Medicare Other

## 2023-01-23 ENCOUNTER — Ambulatory Visit (INDEPENDENT_AMBULATORY_CARE_PROVIDER_SITE_OTHER): Payer: Medicare Other

## 2023-01-23 ENCOUNTER — Other Ambulatory Visit: Payer: Self-pay

## 2023-01-23 ENCOUNTER — Encounter: Payer: Self-pay | Admitting: Family Medicine

## 2023-01-23 DIAGNOSIS — E876 Hypokalemia: Secondary | ICD-10-CM | POA: Diagnosis not present

## 2023-01-23 DIAGNOSIS — Z23 Encounter for immunization: Secondary | ICD-10-CM

## 2023-01-23 DIAGNOSIS — N3000 Acute cystitis without hematuria: Secondary | ICD-10-CM

## 2023-01-23 DIAGNOSIS — R8290 Unspecified abnormal findings in urine: Secondary | ICD-10-CM

## 2023-01-23 LAB — POCT URINALYSIS DIPSTICK
Bilirubin, UA: NEGATIVE
Blood, UA: NEGATIVE
Glucose, UA: NEGATIVE
Ketones, UA: NEGATIVE
Nitrite, UA: NEGATIVE
Protein, UA: NEGATIVE
Spec Grav, UA: 1.02 (ref 1.010–1.025)
Urobilinogen, UA: 1 U/dL
pH, UA: 7 (ref 5.0–8.0)

## 2023-01-23 NOTE — Progress Notes (Signed)
Patient presented for Egg free Flu vaccine to left deltoid, patient voiced no concerns nor showed any signs of distress during injection

## 2023-01-23 NOTE — Telephone Encounter (Signed)
I have placed orders for recheck of these things.  Please make sure she gets scheduled for this.

## 2023-01-24 ENCOUNTER — Telehealth: Payer: Self-pay

## 2023-01-24 LAB — RENAL FUNCTION PANEL
Albumin: 3.8 g/dL (ref 3.5–5.2)
BUN: 15 mg/dL (ref 6–23)
CO2: 28 meq/L (ref 19–32)
Calcium: 9.1 mg/dL (ref 8.4–10.5)
Chloride: 104 meq/L (ref 96–112)
Creatinine, Ser: 1.19 mg/dL (ref 0.40–1.20)
GFR: 45.4 mL/min — ABNORMAL LOW (ref >60.00)
Glucose, Bld: 97 mg/dL (ref 70–99)
Phosphorus: 3.1 mg/dL (ref 2.3–4.6)
Potassium: 3.5 meq/L (ref 3.5–5.1)
Sodium: 141 meq/L (ref 135–145)

## 2023-01-24 NOTE — Telephone Encounter (Signed)
Please see result message

## 2023-01-24 NOTE — Telephone Encounter (Signed)
Patient states she is following-up on her request for documentation.  Patient states she needs to have the document before Thursday, Oct. 31, 2024, so she will be able to continue volunteering.  Patient states she just finished taking the antibiotics we gave her for a UTI on 01/22/2023.  Patient states she was concerned about the results of her urinalysis.  Patient states she wants to be sure something isn't laying dormant waiting to come back.  Patient states she does experience a little burning still.

## 2023-01-24 NOTE — Telephone Encounter (Signed)
Printed a letter and printed off the immunization documentation and attached it for the Patient to pick up. Letter is placed up front in the Patient pick up folder.

## 2023-01-25 ENCOUNTER — Encounter: Payer: Self-pay | Admitting: Family Medicine

## 2023-01-25 LAB — URINE CULTURE
MICRO NUMBER:: 15657841
SPECIMEN QUALITY:: ADEQUATE

## 2023-01-25 LAB — URINALYSIS, MICROSCOPIC ONLY
Bacteria, UA: NONE SEEN /[HPF]
Hyaline Cast: NONE SEEN /LPF
RBC / HPF: NONE SEEN /[HPF] (ref 0–2)

## 2023-01-26 ENCOUNTER — Telehealth: Payer: Self-pay | Admitting: Family Medicine

## 2023-01-26 MED ORDER — CEFADROXIL 500 MG PO CAPS: 500.0000 mg | ORAL_CAPSULE | Freq: Every day | ORAL | 0 refills | Status: DC

## 2023-01-26 NOTE — Telephone Encounter (Signed)
Copied from CRM [id number]. Topic: Medicare AWV >> Jan 26, 2023  1:59 PM Payton Doughty wrote: Reason for CRM: Called LVM 01/26/2023 to schedule Annual Wellness Visit  Verlee Rossetti; Care Guide Ambulatory Clinical Support [place] [facility] [place] [facility] Direct Dial: [phone]

## 2023-01-26 NOTE — Telephone Encounter (Signed)
Copied from CRM [id number]. Topic: Medicare AWV >> Jan 26, 2023  2:05 PM Payton Doughty wrote: Reason for CRM: Called LVM 01/26/2023 to schedule Annual Wellness Visit  Verlee Rossetti; Care Guide Ambulatory Clinical Support [place] [facility] Direct Dial: [phone]

## 2023-01-30 ENCOUNTER — Telehealth: Payer: Self-pay | Admitting: Family Medicine

## 2023-01-30 NOTE — Telephone Encounter (Signed)
Patient is still wheezing and coughing and the cough medication helps at night. Patient states she saw pulmonology that think it is a cycle of asthma and copd so they are going to start her on Dupexent.

## 2023-01-30 NOTE — Telephone Encounter (Signed)
Please see how her symptoms are doing from her prior respiratory infection? Have they improved? What symptoms is she still having?

## 2023-01-30 NOTE — Telephone Encounter (Signed)
Prescription Request  01/30/2023  LOV: 01/09/2023  What is the name of the medication or equipment?  guaiFENesin-codeine 100-10 MG/5ML syrup . She needs it for her cough at night time.   Have you contacted your pharmacy to request a refill? No   Which pharmacy would you like this sent to?  [id number] [place], [place] - [address] [place] [address] Phone: [phone] Fax: [phone]    Patient notified that their request is being sent to the clinical staff for review and that they should receive a response within 2 business days.   Please advise at Mobile [phone] (mobile)

## 2023-01-31 ENCOUNTER — Other Ambulatory Visit: Payer: Self-pay | Admitting: Family

## 2023-01-31 ENCOUNTER — Encounter: Payer: Self-pay | Admitting: Family Medicine

## 2023-01-31 DIAGNOSIS — R059 Cough, unspecified: Secondary | ICD-10-CM

## 2023-01-31 MED ORDER — GUAIFENESIN-CODEINE 100-10 MG/5ML PO SOLN
5.0000 mL | Freq: Three times a day (TID) | ORAL | 0 refills | Status: DC | PRN
Start: 2023-01-31 — End: 2023-04-19

## 2023-02-06 ENCOUNTER — Ambulatory Visit: Payer: Medicare Other | Admitting: Allergy & Immunology

## 2023-02-21 ENCOUNTER — Ambulatory Visit: Payer: Medicare Other | Attending: Medical | Admitting: Medical

## 2023-02-21 ENCOUNTER — Encounter: Payer: Self-pay | Admitting: Medical

## 2023-02-21 VITALS — BP 118/60 | HR 63 | Ht 63.5 in | Wt 103.0 lb

## 2023-02-21 DIAGNOSIS — R0609 Other forms of dyspnea: Secondary | ICD-10-CM

## 2023-02-21 DIAGNOSIS — J4530 Mild persistent asthma, uncomplicated: Secondary | ICD-10-CM

## 2023-02-21 DIAGNOSIS — I493 Ventricular premature depolarization: Secondary | ICD-10-CM

## 2023-02-21 DIAGNOSIS — J449 Chronic obstructive pulmonary disease, unspecified: Secondary | ICD-10-CM | POA: Diagnosis present

## 2023-02-21 DIAGNOSIS — R002 Palpitations: Secondary | ICD-10-CM | POA: Diagnosis present

## 2023-02-21 DIAGNOSIS — I872 Venous insufficiency (chronic) (peripheral): Secondary | ICD-10-CM | POA: Diagnosis present

## 2023-02-21 NOTE — Patient Instructions (Signed)
Medication Instructions:  Your Physician recommend you continue on your current medication as directed.    *If you need a refill on your cardiac medications before your next appointment, please call your pharmacy*   Lab Work: None ordered at this time    Follow-Up: At [facility], you and your health needs are our priority.  As part of our continuing mission to provide you with exceptional heart care, we have created designated Provider Care Teams.  These Care Teams include your primary Cardiologist (physician) and Advanced Practice Providers (APPs -  Physician Assistants and Nurse Practitioners) who all work together to provide you with the care you need, when you need it.  We recommend signing up for the patient portal called "MyChart".  Sign up information is provided on this After Visit Summary.  MyChart is used to connect with patients for Virtual Visits (Telemedicine).  Patients are able to view lab/test results, encounter notes, upcoming appointments, etc.  Non-urgent messages can be sent to your provider as well.   To learn more about what you can do with MyChart, go to [email or web address].    Your next appointment:   1 year(s)  Provider:   You may see Lorine Bears, MD or one of the following Advanced Practice Providers on your designated Care Team:   Nicolasa Ducking, NP Eula Listen, PA-C Cadence Fransico Michael, PA-C Charlsie Quest, NP Carlos Levering, NP

## 2023-02-21 NOTE — Progress Notes (Signed)
Cardiology Office Note:    Date:  02/21/2023   ID:  Anne [name], DOB Sep 14, 1950, MRN [id number]  PCP:  Glori Luis, MD  [facility] Cardiologist:  Lorine Bears, MD  [facility] Electrophysiologist:  None   Referring MD: Glori Luis, MD   Chief Complaint: 1 year follow-up  History of Present Illness:    Anne [name] is a 72 y.o. female with a hx of small TIAs on Plavix, asthma/COPD, IBC, dysphagia, chronic nutritional problems requiring feeding tube in the past, palpitations who presents for follow-up.  In 2019 the patient had a heart monitor which showed 1 short run of SVT and she was started on metoprolol.  She had cardiac CTA done in May 2022 which showed calcium score of 0 with no evidence of CAD.  Echo in April 2022 showed normal LVSF with no significant valvular abnormalities.  Patient was last seen in April 2023 and was overall stable from a cardiac perspective.  Today, the patient is overall doing well. She reports intermittent fluttering. No exertional chest pain. She has chest pain related to coughing and stomach pain. She has SOB from asthma/COPD. No lower leg edema, she takes lasix. She denies lightheadedness and dizziness. PCP stopped Plavix for bruising.   Past Medical History:  Diagnosis Date   Achalasia of esophagus    Allergic rhinitis    Allergy    multiple, mostly aspirin, levaquin and shellfish.   Anemia    Anxiety    Arthritis    Asthma    Bilateral leg edema    Cataract 2014   exraction with len implant   Chronic pain syndrome    Chronic pulmonary aspiration    Complication of anesthesia    Breathing problems upon waking up. 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   Compressed cervical disc    COPD (chronic obstructive pulmonary disease) (HCC)    CVA (cerebral infarction)    Dyspnea    Esophageal dysmotility    Gastritis    H/O food anaphylaxis    Heart murmur    as child   History of hiatal  hernia    Hypokalemia    IBS (irritable bowel syndrome)    Kidney lesion 2018   Left   Liver lesion    Migraine    PICC (peripherally inserted central catheter) removal 02/20/2017   PONV (postoperative nausea and vomiting)    Problems with swallowing    intermittently   Pulmonary fibrosis (HCC)    Renal cell carcinoma (HCC)    small- left kidney   S/P cervical spinal fusion 01/10/2017   Seizures (HCC)    only in elementary school-was on medication and was tx up until 5th grade and has had no more seizures since then   Shingles    Stroke [facility])    slurred speech, drawn face, imaging normal, occurred twice, [facility]-"TIA" if antything" 02/20/17-no residual effects   Tracheal granulation    Tracheostomy in place [facility])    Vocal cord paresis     Past Surgical History:  Procedure Laterality Date   ABDOMINAL HYSTERECTOMY     APPLICATION OF A-CELL OF HEAD/NECK N/A 02/21/2017   Procedure: APPLICATION OF A-CELL OF HEAD/NECK;  Surgeon: Peggye Form, DO;  Location: [facility] OR;  Service: Plastics;  Laterality: N/A;   BACK SURGERY     BIOPSY  04/06/2022   Procedure: BIOPSY;  Surgeon: Beverley Fiedler, MD;  Location: [facility] ENDOSCOPY;  Service: Gastroenterology;;   BOTOX  INJECTION N/A 07/29/2013   Procedure: BOTOX INJECTION;  Surgeon: Beverley Fiedler, MD;  Location: [facility] ENDOSCOPY;  Service: Gastroenterology;  Laterality: N/A;   BOTOX INJECTION N/A 05/04/2015   Procedure: BOTOX INJECTION;  Surgeon: Beverley Fiedler, MD;  Location: [facility] ENDOSCOPY;  Service: Gastroenterology;  Laterality: N/A;   BOTOX INJECTION N/A 02/18/2018   Procedure: BOTOX INJECTION;  Surgeon: Beverley Fiedler, MD;  Location: [facility] ENDOSCOPY;  Service: Gastroenterology;  Laterality: N/A;   BOTOX INJECTION N/A 06/30/2019   Procedure: BOTOX INJECTION;  Surgeon: Beverley Fiedler, MD;  Location: [facility] ENDOSCOPY;  Service: Gastroenterology;  Laterality: N/A;   BOTOX INJECTION N/A 08/05/2020   Procedure: BOTOX INJECTION;  Surgeon: Beverley Fiedler, MD;   Location: [facility] ENDOSCOPY;  Service: Gastroenterology;  Laterality: N/A;   BOTOX INJECTION N/A 06/16/2021   Procedure: BOTOX INJECTION;  Surgeon: Beverley Fiedler, MD;  Location: [facility] ENDOSCOPY;  Service: Gastroenterology;  Laterality: N/A;   BOTOX INJECTION N/A 04/06/2022   Procedure: BOTOX INJECTION;  Surgeon: Beverley Fiedler, MD;  Location: [facility] ENDOSCOPY;  Service: Gastroenterology;  Laterality: N/A;   BREAST BIOPSY Left    neg   BREAST SURGERY Left 2002   bx of skin   CHOLECYSTECTOMY     COLONOSCOPY     DIAGNOSTIC LAPAROSCOPY     ESOPHAGEAL MANOMETRY N/A 12/16/2012   Procedure: ESOPHAGEAL MANOMETRY (EM);  Surgeon: Beverley Fiedler, MD;  Location: [facility] ENDOSCOPY;  Service: Gastroenterology;  Laterality: N/A;   ESOPHAGOGASTRODUODENOSCOPY (EGD) WITH PROPOFOL N/A 07/29/2013   Procedure: ESOPHAGOGASTRODUODENOSCOPY (EGD) WITH PROPOFOL;  Surgeon: Beverley Fiedler, MD;  Location: [facility] ENDOSCOPY;  Service: Gastroenterology;  Laterality: N/A;  with botox injection   ESOPHAGOGASTRODUODENOSCOPY (EGD) WITH PROPOFOL N/A 05/04/2015   Procedure: ESOPHAGOGASTRODUODENOSCOPY (EGD) WITH PROPOFOL;  Surgeon: Beverley Fiedler, MD;  Location: [facility] ENDOSCOPY;  Service: Gastroenterology;  Laterality: N/A;   ESOPHAGOGASTRODUODENOSCOPY (EGD) WITH PROPOFOL N/A 02/18/2018   Procedure: ESOPHAGOGASTRODUODENOSCOPY (EGD) WITH PROPOFOL;  Surgeon: Beverley Fiedler, MD;  Location: [facility] ENDOSCOPY;  Service: Gastroenterology;  Laterality: N/A;   ESOPHAGOGASTRODUODENOSCOPY (EGD) WITH PROPOFOL N/A 06/30/2019   Procedure: ESOPHAGOGASTRODUODENOSCOPY (EGD) WITH PROPOFOL;  Surgeon: Beverley Fiedler, MD;  Location: [facility] ENDOSCOPY;  Service: Gastroenterology;  Laterality: N/A;   ESOPHAGOGASTRODUODENOSCOPY (EGD) WITH PROPOFOL N/A 08/05/2020   Procedure: ESOPHAGOGASTRODUODENOSCOPY (EGD) WITH PROPOFOL;  Surgeon: Beverley Fiedler, MD;  Location: [facility] ENDOSCOPY;  Service: Gastroenterology;  Laterality: N/A;   ESOPHAGOGASTRODUODENOSCOPY (EGD) WITH PROPOFOL N/A 06/16/2021   Procedure:  ESOPHAGOGASTRODUODENOSCOPY (EGD) WITH PROPOFOL;  Surgeon: Beverley Fiedler, MD;  Location: [facility] ENDOSCOPY;  Service: Gastroenterology;  Laterality: N/A;   ESOPHAGOGASTRODUODENOSCOPY (EGD) WITH PROPOFOL N/A 04/06/2022   Procedure: ESOPHAGOGASTRODUODENOSCOPY (EGD) WITH PROPOFOL;  Surgeon: Beverley Fiedler, MD;  Location: [facility] ENDOSCOPY;  Service: Gastroenterology;  Laterality: N/A;   EYE SURGERY     Catarct surgery 2014   Eye Surgery AS Child Left    INCISION AND DRAINAGE OF WOUND N/A 02/21/2017   Procedure: IRRIGATION AND DEBRIDEMENT WOUND NECK;  Surgeon: Peggye Form, DO;  Location: [facility] OR;  Service: Plastics;  Laterality: N/A;   JEJUNOSTOMY FEEDING TUBE     x2 both failed. no longer has   LUMBAR LAMINECTOMY/DECOMPRESSION MICRODISCECTOMY Bilateral 05/13/2018   Procedure: Laminectomy and Foraminotomy - Lumbar four-Lumbar five- bilateral;  Surgeon: Tia Alert, MD;  Location: [facility] OR;  Service: Neurosurgery;  Laterality: Bilateral;   LUMBAR LAMINECTOMY/DECOMPRESSION MICRODISCECTOMY Bilateral 11/08/2020   Procedure: Laminectomy and Foraminotomy - bilateral - Lumbar two-Lumbar three - Lumbar three-Lumbar four;  Surgeon: Yetta Barre,  Kermit Balo, MD;  Location: [facility] OR;  Service: Neurosurgery;  Laterality: Bilateral;   MULTIPLE TOOTH EXTRACTIONS     2 teeth removed   OOPHORECTOMY     POSTERIOR CERVICAL FUSION/FORAMINOTOMY N/A 01/10/2017   Procedure: LAMINECTOMY AND FORAMINOTOMY CERVICAL FOUR-CERVICAL FIVE, CERVICAL FIVE-SIX POSTERIOR CERVICAL INSTRUMENT FUSION CERVICAL THREE-CERVICAL SEVEN,CERVICAL LAMINECTOMY CERVICAL THREE-CERVICAL SEVEN.;  Surgeon: Tia Alert, MD;  Location: [facility] OR;  Service: Neurosurgery;  Laterality: N/A;  posterior   TONSILLECTOMY     TRACHEOSTOMY  1996   done at [facility], Dr. Elenore Rota   TRACHEOSTOMY REVISION N/A 09/14/2021   Procedure: TRACHEOSTOMA REVISION, TRACHEOPLASTY;  Surgeon: Vernie Murders, MD;  Location: [facility] ORS;  Service: ENT;  Laterality: N/A;   TUBAL LIGATION     VIDEO  BRONCHOSCOPY Bilateral 11/20/2012   Procedure: VIDEO BRONCHOSCOPY WITH FLUORO;  Surgeon: Lupita Leash, MD;  Location: [facility] ENDOSCOPY;  Service: Cardiopulmonary;  Laterality: Bilateral;    Current Medications: Current Meds  Medication Sig   acetaminophen (TYLENOL) 500 MG tablet Take 1,000 mg by mouth 2 (two) times daily as needed for moderate pain or headache.   albuterol (VENTOLIN HFA) 108 (90 Base) MCG/ACT inhaler Inhale 2 puffs into the lungs every 6 (six) hours as needed for wheezing or shortness of breath.   azelastine (ASTELIN) 0.1 % nasal spray Place 1 spray into both nostrils 2 (two) times daily. Use in each nostril as directed (Patient taking differently: Place 1 spray into both nostrils 2 (two) times daily as needed for allergies. Use in each nostril as directed)   cefadroxil (DURICEF) 500 MG capsule Take 1 capsule (500 mg total) by mouth daily.   cetirizine (ZYRTEC) 10 MG tablet TAKE 1 TABLET BY MOUTH EVERY DAY (NOT COVERED   diphenhydrAMINE (BENADRYL) 25 MG tablet Take 50 mg by mouth daily as needed for allergies (Asthma).   Dupilumab 300 MG/2ML SOAJ Inject into the skin every 14 (fourteen) days.   EPINEPHrine 0.3 mg/0.3 mL IJ SOAJ injection Inject 0.3 mg into the muscle as needed for anaphylaxis.    estradiol (ESTRACE) 1 MG tablet TAKE 1 TABLET BY MOUTH EVERY DAY   Fluticasone-Umeclidin-Vilant (TRELEGY ELLIPTA) 100-62.5-25 MCG/ACT AEPB Inhale 1 puff into the lungs daily. (Patient taking differently: Inhale 1 puff into the lungs daily as needed (asthma).)   furosemide (LASIX) 20 MG tablet TAKE 1 TABLET BY MOUTH EVERY DAY AS NEEDED   guaiFENesin-codeine 100-10 MG/5ML syrup Take 5 mLs by mouth 3 (three) times daily as needed for cough.   ibuprofen (ADVIL,MOTRIN) 200 MG tablet Take 400-800 mg by mouth daily as needed for headache or moderate pain.   ipratropium-albuterol (DUONEB) 0.5-2.5 (3) MG/3ML SOLN Take 3 mLs by nebulization every 4 (four) hours as needed. Dx 496   montelukast  (SINGULAIR) 10 MG tablet TAKE 1 TABLET BY MOUTH EVERYDAY AT BEDTIME   ondansetron (ZOFRAN-ODT) 4 MG disintegrating tablet Take 1 tablet (4 mg total) by mouth every 6 (six) hours as needed for nausea or vomiting.   pantoprazole (PROTONIX) 40 MG tablet TAKE 1 TABLET BY MOUTH TWICE A DAY   Pseudoephedrine HCl (SUDAFED PO) Take 1 tablet by mouth daily as needed (cold symptons).   rizatriptan (MAXALT-MLT) 10 MG disintegrating tablet TAKE 1 TABLET (10 MG TOTAL) BY MOUTH DAILY AS NEEDED FOR MIGRAINE. MAY REPEAT FOR ONE DOSE 2 HOURS AFTER THE FIRST ONE IF NEEDED.   sodium chloride (OCEAN) 0.65 % SOLN nasal spray Place 1 spray into both nostrils every 4 (four) hours as needed for congestion.  White Petrolatum-Mineral Oil (LUBRICANT EYE) OINT Place 1 application into both eyes 2 (two) times daily as needed (for dry eyes).      Allergies:   Asa [aspirin], Bee venom, Doxycycline, Epinephrine, Feraheme [ferumoxytol], Influenza vaccines, Pfizer-biontech covid-19 vacc [covid-19 mrna vacc (moderna)], Quinolones, Shellfish allergy, Celebrex [celecoxib], Ciprofloxacin, Clarithromycin, Erythromycin, Levaquin [levofloxacin in d5w], Peanut-containing drug products, Pineapple, Septra [sulfamethoxazole-trimethoprim], Telithromycin, Zanaflex [tizanidine], and Adhesive [tape]   Social History   Socioeconomic History   Marital status: Married    Spouse name: Not on file   Number of children: 4   Years of education: Not on file   Highest education level: Some college, no degree  Occupational History   Not on file  Tobacco Use   Smoking status: Never   Smokeless tobacco: Never  Vaping Use   Vaping status: Never Used  Substance and Sexual Activity   Alcohol use: Yes    Comment: occ wine   Drug use: No   Sexual activity: Yes  Other Topics Concern   Not on file  Social History Narrative   Lives in [place] with husband.  She has four children.   Retired from [employer]  [employer]: Low Risk  (01/08/2023)   Overall Financial Resource Strain (CARDIA)    Difficulty of Paying Living Expenses: Not hard at all  Food Insecurity: No Food Insecurity (01/08/2023)   Hunger Vital Sign    Worried About Running Out of Food in the Last Year: Never true    Ran Out of Food in the Last Year: Never true  Transportation Needs: No Transportation Needs (01/08/2023)   PRAPARE - Administrator, Civil Service (Medical): No    Lack of Transportation (Non-Medical): No  Physical Activity: Insufficiently Active (01/08/2023)   Exercise Vital Sign    Days of Exercise per Week: 4 days    Minutes of Exercise per Session: 20 min  Stress: No Stress Concern Present (01/08/2023)   [employer] of Occupational Health - Occupational Stress Questionnaire    Feeling of Stress : Not at all  Social Connections: Socially Integrated (01/08/2023)   Social Connection and Isolation Panel [NHANES]    Frequency of Communication with Friends and Family: More than three times a week    Frequency of Social Gatherings with Friends and Family: More than three times a week    Attends Religious Services: More than 4 times per year    Active Member of [employer] or Organizations: Yes    Attends Engineer, structural: More than 4 times per year    Marital Status: Married     Family History: The patient's family history includes Alcohol abuse in her mother; Arrhythmia in her father; Asthma in her cousin and father; Breast cancer (age of onset: 51) in her maternal aunt; Breast cancer (age of onset: 16) in her maternal grandmother; COPD in her cousin and paternal grandfather; Kidney cancer in her father; Lung cancer in her father and paternal uncle. There is no history of Bladder Cancer, Colon cancer, Esophageal cancer, Pancreatic cancer, Stomach cancer, or Liver disease.  ROS:   Please see the history of present illness.     All other systems reviewed and are  negative.  EKGs/Labs/Other Studies Reviewed:    The following studies were reviewed today:  Echo 02/2022 1. Left ventricular ejection fraction, by estimation, is 55 to 60%. Left  ventricular ejection fraction by  2D MOD biplane is 57.9 %. The left  ventricle has normal function. The left ventricle has no regional wall  motion abnormalities. Left ventricular  diastolic parameters are consistent with Grade II diastolic dysfunction  (pseudonormalization).   2. Right ventricular systolic function is normal. The right ventricular  size is normal. There is mildly elevated pulmonary artery systolic  pressure.   3. The mitral valve is normal in structure. No evidence of mitral valve  regurgitation.   4. The aortic valve was not well visualized. Aortic valve regurgitation  is mild to moderate.   5. The inferior vena cava is normal in size with <50% respiratory  variability, suggesting right atrial pressure of 8 mmHg.   Cardiac CT 07/2020  IMPRESSION: 1. Normal coronary calcium score of 0. Patient is low risk for coronary events.   2. Normal coronary origin with right dominance.   3. No evidence of CAD.   4. CAD-RADS 0. Consider non-atherosclerotic causes of chest pain.   Electronically Signed: By: Debbe Odea M.D. On: 08/12/2020 15:45    Echo 06/2020 1. Left ventricular ejection fraction, by estimation, is 55 to 60%. The  left ventricle has normal function. The left ventricle has no regional  wall motion abnormalities. Left ventricular diastolic parameters are  consistent with Grade II diastolic  dysfunction (pseudonormalization).   2. Right ventricular systolic function is normal. The right ventricular  size is normal.   3. The mitral valve is normal in structure. No evidence of mitral valve  regurgitation.   4. The aortic valve was not well visualized. Aortic valve regurgitation  is mild to moderate.   5. The inferior vena cava is normal in size with greater than 50%   respiratory variability, suggesting right atrial pressure of 3 mmHg.    EKG:  EKG is ordered today.  The ekg ordered today demonstrates NSR 63bpm, PAC, TWI aVL  Recent Labs: 01/23/2023: BUN 15; Creatinine, Ser 1.19; Potassium 3.5; Sodium 141  Recent Lipid Panel    Component Value Date/Time   CHOL 184 09/06/2021 0856   TRIG 79.0 09/06/2021 0856   HDL 88.10 09/06/2021 0856   CHOLHDL 2 09/06/2021 0856   VLDL 15.8 09/06/2021 0856   LDLCALC 80 09/06/2021 0856   LDLDIRECT 92.7 12/13/2012 1037   Physical Exam:    VS:  BP 118/60 (BP Location: Left Arm, Patient Position: Sitting, Cuff Size: Normal)   Pulse 63   Ht 5' 3.5" (1.613 m)   Wt 103 lb (46.7 kg)   SpO2 99%   BMI 17.96 kg/m     Wt Readings from Last 3 Encounters:  02/21/23 103 lb (46.7 kg)  01/12/23 100 lb (45.4 kg)  01/09/23 99 lb (44.9 kg)     GEN:  Well nourished, well developed in no acute distress HEENT: Normal NECK: No JVD; No carotid bruits LYMPHATICS: No lymphadenopathy CARDIAC: RRR, no murmurs, rubs, gallops RESPIRATORY:  +wheezing  ABDOMEN: Soft, non-tender, non-distended MUSCULOSKELETAL:  No edema; No deformity  SKIN: Warm and dry NEUROLOGIC:  Alert and oriented x 3 PSYCHIATRIC:  Normal affect   ASSESSMENT:    1. DOE (dyspnea on exertion)   2. Mild persistent asthma without complication   3. Chronic obstructive pulmonary disease, unspecified COPD type (HCC)   4. Chronic venous insufficiency   5. PVC's (premature ventricular contractions)   6. Palpitations    PLAN:    In order of problems listed above:  Chronic DOE Asthma/COPD Patient follows with pulmonology for asthma/COPD. Wheezing on exam today.  Prior cardiac CTA showed coronary calcium score of 0.  Chronic venous insufficiency No swelling on exam. Continue lasix 20mg  daily.   Palpitations She has rare palpitations. Not on BB at baseline. Prior heart monitor showed NSR, 1 run of SVT and PACs. Continue to monitor symptoms.    Disposition: Follow up in 1 year with MD/APP    Signed, Liara Holm David Stall, PA-C  02/21/2023 11:06 AM    [facility]

## 2023-02-28 ENCOUNTER — Other Ambulatory Visit: Payer: Self-pay | Admitting: Family Medicine

## 2023-03-07 ENCOUNTER — Other Ambulatory Visit: Payer: Self-pay | Admitting: Family Medicine

## 2023-04-01 ENCOUNTER — Other Ambulatory Visit: Payer: Self-pay | Admitting: Family Medicine

## 2023-04-03 ENCOUNTER — Encounter: Payer: Self-pay | Admitting: Ophthalmology

## 2023-04-03 ENCOUNTER — Other Ambulatory Visit: Payer: Self-pay

## 2023-04-03 ENCOUNTER — Ambulatory Visit: Payer: Medicare Other | Admitting: Allergy & Immunology

## 2023-04-04 ENCOUNTER — Telehealth: Payer: Self-pay | Admitting: Internal Medicine

## 2023-04-04 NOTE — Telephone Encounter (Signed)
 Called patient in reference to her complaints of GERD. Patient states she is presently taking Pantoprazole  40 mg daily, it was reduced via Dr. Albertus,  although noted in the patient's chart as twice daily. Nurse informed the patient, maybe you can go back to taking the medication twice daily to see if that improves the GERD. I will contact Dr. Albertus to see if you can do that or see if he wants to try something else instead. Will notify you as soon as Dr. Albertus notifies me.

## 2023-04-04 NOTE — Telephone Encounter (Signed)
 Patient called and stated that she is having trouble controlling her GERD and she is doing all the necessary steps like taking her medication as directed and changing her diet. Patient is wanting to talk to a nurse on what else she could do to control her GERD. Please advise.

## 2023-04-04 NOTE — Telephone Encounter (Signed)
 Called patient to notify that she needs to do the Pantoprazole 40 mg BID AC meals for 2-3 weeks, call us back to let us know if any better. Patient agreed.

## 2023-04-04 NOTE — Telephone Encounter (Signed)
 Yes, would try BID-AC for 2-3 weeks and then let us know if not helping JMP

## 2023-04-11 ENCOUNTER — Ambulatory Visit: Payer: Self-pay | Admitting: General Practice

## 2023-04-11 ENCOUNTER — Ambulatory Visit
Admission: RE | Admit: 2023-04-11 | Discharge: 2023-04-11 | Disposition: A | Discharge status: Home / Self Care | Payer: Medicare Other | Attending: Ophthalmology | Admitting: Ophthalmology

## 2023-04-11 ENCOUNTER — Other Ambulatory Visit: Payer: Self-pay

## 2023-04-11 ENCOUNTER — Encounter
Admission: RE | Disposition: A | Discharge status: Home / Self Care | Payer: Self-pay | Source: Home / Self Care | Attending: Ophthalmology

## 2023-04-11 ENCOUNTER — Encounter: Payer: Self-pay | Admitting: Ophthalmology

## 2023-04-11 DIAGNOSIS — H2511 Age-related nuclear cataract, right eye: Secondary | ICD-10-CM | POA: Diagnosis present

## 2023-04-11 DIAGNOSIS — J4489 Other specified chronic obstructive pulmonary disease: Secondary | ICD-10-CM | POA: Insufficient documentation

## 2023-04-11 HISTORY — PX: CATARACT EXTRACTION W/PHACO: SHX586

## 2023-04-11 SURGERY — PHACOEMULSIFICATION, CATARACT, WITH IOL INSERTION
Anesthesia: Monitor Anesthesia Care | Site: Eye | Laterality: Right

## 2023-04-11 MED ORDER — SIGHTPATH DOSE#1 NA HYALUR & NA CHOND-NA HYALUR IO KIT
PACK | INTRAOCULAR | Status: DC | PRN
  Administered 2023-04-11: 1 via OPHTHALMIC

## 2023-04-11 MED ORDER — CEFUROXIME OPHTHALMIC INJECTION 1 MG/0.1 ML
INJECTION | OPHTHALMIC | Status: DC | PRN
  Administered 2023-04-11: .1 mL via INTRACAMERAL

## 2023-04-11 MED ORDER — SIGHTPATH DOSE#1 BSS IO SOLN
INTRAOCULAR | Status: DC | PRN
  Administered 2023-04-11: 15 mL via INTRAOCULAR

## 2023-04-11 MED ORDER — MIDAZOLAM HCL 2 MG/2ML IJ SOLN
INTRAMUSCULAR | Status: AC
  Filled 2023-04-11: qty 2

## 2023-04-11 MED ORDER — SIGHTPATH DOSE#1 BSS IO SOLN
INTRAOCULAR | Status: DC | PRN
  Administered 2023-04-11: 1 mL

## 2023-04-11 MED ORDER — ARMC OPHTHALMIC DILATING DROPS
1.0000 | OPHTHALMIC | Status: DC | PRN
  Administered 2023-04-11 (×3): 1 via OPHTHALMIC

## 2023-04-11 MED ORDER — ARMC OPHTHALMIC DILATING DROPS
OPHTHALMIC | Status: AC
  Filled 2023-04-11: qty 0.5

## 2023-04-11 MED ORDER — SIGHTPATH DOSE#1 BSS IO SOLN
INTRAOCULAR | Status: DC | PRN
  Administered 2023-04-11: 75 mL via OPHTHALMIC

## 2023-04-11 MED ORDER — BRIMONIDINE TARTRATE-TIMOLOL 0.2-0.5 % OP SOLN
OPHTHALMIC | Status: DC | PRN
  Administered 2023-04-11: 1 [drp] via OPHTHALMIC

## 2023-04-11 MED ORDER — TETRACAINE HCL 0.5 % OP SOLN
1.0000 [drp] | OPHTHALMIC | Status: DC | PRN
  Administered 2023-04-11 (×3): 1 [drp] via OPHTHALMIC

## 2023-04-11 MED ORDER — TETRACAINE HCL 0.5 % OP SOLN
OPHTHALMIC | Status: AC
  Filled 2023-04-11: qty 4

## 2023-04-11 MED ORDER — MIDAZOLAM HCL 2 MG/2ML IJ SOLN
INTRAMUSCULAR | Status: DC | PRN
  Administered 2023-04-11: 2 mg via INTRAVENOUS

## 2023-04-11 SURGICAL SUPPLY — 9 items
CATARACT SUITE SIGHTPATH (MISCELLANEOUS) ×1
FEE CATARACT SUITE SIGHTPATH (MISCELLANEOUS) ×1 IMPLANT
GLOVE SRG 8 PF TXTR STRL LF DI (GLOVE) ×1 IMPLANT
GLOVE SURG ENC TEXT LTX SZ7.5 (GLOVE) ×1 IMPLANT
LENS IOL CLRN WGN WHL 24.5 (Intraocular Lens) IMPLANT
LENS IOL MONO WAGON WHEEL YLW (Intraocular Lens) ×1 IMPLANT
NDL FILTER BLUNT 18X1 1/2 (NEEDLE) ×1 IMPLANT
NEEDLE FILTER BLUNT 18X1 1/2 (NEEDLE) ×1
SYR 3ML LL SCALE MARK (SYRINGE) ×1 IMPLANT

## 2023-04-11 NOTE — Transfer of Care (Signed)
 Immediate Anesthesia Transfer of Care Note  Patient: Anne [name]  Procedure(s) Performed: CATARACT EXTRACTION PHACO AND INTRAOCULAR LENS PLACEMENT (IOC) RIGHT 8.77, 00:36.3 (Right: Eye)  Patient Location: PACU  Anesthesia Type: MAC  Level of Consciousness: awake, alert  and patient cooperative  Airway and Oxygen Therapy: Patient Spontanous Breathing and Patient connected to supplemental oxygen  Post-op Assessment: Post-op Vital signs reviewed, Patient's Cardiovascular Status Stable, Respiratory Function Stable, Patent Airway and No signs of Nausea or vomiting  Post-op Vital Signs: Reviewed and stable  Complications: No notable events documented.

## 2023-04-11 NOTE — Op Note (Signed)
 LOCATION:  [facility]   PREOPERATIVE DIAGNOSIS:    Nuclear sclerotic cataract right eye. H25.11   POSTOPERATIVE DIAGNOSIS:  Nuclear sclerotic cataract right eye.     PROCEDURE:  Phacoemusification with posterior chamber intraocular lens placement of the right eye   ULTRASOUND TIME: Procedure(s): CATARACT EXTRACTION PHACO AND INTRAOCULAR LENS PLACEMENT (IOC) RIGHT 8.77, 00:36.3 (Right)  LENS:   Implant Name Type Inv. Item Serial No. Manufacturer Lot No. LRB No. Used Action  LENS IOL MONO WAGON WHEEL YLW - [id number] Intraocular Lens LENS IOL MONO WAGON WHEEL YLW [id number] [employer]  Right 1 Implanted         SURGEON:  Berline Brenner, MD   ANESTHESIA:  Topical with tetracaine  drops and 2% Xylocaine  jelly, augmented with 1% preservative-free intracameral lidocaine .    COMPLICATIONS:  None.   DESCRIPTION OF PROCEDURE:  The patient was identified in the holding room and transported to the operating room and placed in the supine position under the operating microscope.  The right eye was identified as the operative eye and it was prepped and draped in the usual sterile ophthalmic fashion.   A 1 millimeter clear-corneal paracentesis was made at the 12:00 position.  0.5 ml of preservative-free 1% lidocaine  was injected into the anterior chamber. The anterior chamber was filled with Viscoat viscoelastic.  A 2.4 millimeter keratome was used to make a near-clear corneal incision at the 9:00 position.  A curvilinear capsulorrhexis was made with a cystotome and capsulorrhexis forceps.  Balanced salt solution was used to hydrodissect and hydrodelineate the nucleus.   Phacoemulsification was then used in stop and chop fashion to remove the lens nucleus and epinucleus.  The remaining cortex was then removed using the irrigation and aspiration handpiece. Provisc was then placed into the capsular bag to distend it for lens placement.  A lens was then injected into the capsular bag.   The remaining viscoelastic was aspirated.   Wounds were hydrated with balanced salt solution.  The anterior chamber was inflated to a physiologic pressure with balanced salt solution.  No wound leaks were noted. Cefuroxime  0.1 ml of a 10mg /ml solution was injected into the anterior chamber for a dose of 1 mg of intracameral antibiotic at the completion of the case.   Timolol  and Brimonidine  drops were applied to the eye.  The patient was taken to the recovery room in stable condition without complications of anesthesia or surgery.   Anesa Fronek 04/11/2023, 9:20 AM

## 2023-04-11 NOTE — H&P (Signed)
 [facility]   Primary Care Physician:  Kent Pear, MD Ophthalmologist: Dr. Annell Kidney  Pre-Procedure History & Physical: HPI:  Anne [name] is a 73 y.o. female here for ophthalmic surgery.   Past Medical History:  Diagnosis Date   Achalasia of esophagus    Allergic rhinitis    Allergy    multiple, mostly aspirin, levaquin and shellfish.   Anemia    Anxiety    Arthritis    Asthma    Bilateral leg edema    Cataract 2014   exraction with len implant   Chronic pain syndrome    Chronic pulmonary aspiration    Complication of anesthesia    Breathing problems upon waking up. 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   Compressed cervical disc    COPD (chronic obstructive pulmonary disease) (HCC)    CVA (cerebral infarction)    Dyspnea    Esophageal dysmotility    Gastritis    H/O food anaphylaxis    Heart murmur    as child   History of hiatal hernia    Hypokalemia    IBS (irritable bowel syndrome)    Kidney lesion 2018   Left   Liver lesion    Migraine    PICC (peripherally inserted central catheter) removal 02/20/2017   PONV (postoperative nausea and vomiting)    Problems with swallowing    intermittently   Pulmonary fibrosis (HCC)    Renal cell carcinoma (HCC)    small- left kidney   S/P cervical spinal fusion 01/10/2017   Seizures (HCC)    only in elementary school-was on medication and was tx up until 5th grade and has had no more seizures since then   Shingles    Stroke [facility])    slurred speech, drawn face, imaging normal, occurred twice, [facility]-"TIA" if antything" 02/20/17-no residual effects   Tracheal granulation    Tracheostomy in place [facility])    Vocal cord paresis     Past Surgical History:  Procedure Laterality Date   ABDOMINAL HYSTERECTOMY     APPLICATION OF A-CELL OF HEAD/NECK N/A 02/21/2017   Procedure: APPLICATION OF A-CELL OF HEAD/NECK;  Surgeon: Thornell Flirt, DO;  Location: [facility] OR;   Service: Plastics;  Laterality: N/A;   BACK SURGERY     BIOPSY  04/06/2022   Procedure: BIOPSY;  Surgeon: Nannette Babe, MD;  Location: [facility] ENDOSCOPY;  Service: Gastroenterology;;   BOTOX  INJECTION N/A 07/29/2013   Procedure: BOTOX  INJECTION;  Surgeon: Nannette Babe, MD;  Location: [facility] ENDOSCOPY;  Service: Gastroenterology;  Laterality: N/A;   BOTOX  INJECTION N/A 05/04/2015   Procedure: BOTOX  INJECTION;  Surgeon: Nannette Babe, MD;  Location: [facility] ENDOSCOPY;  Service: Gastroenterology;  Laterality: N/A;   BOTOX  INJECTION N/A 02/18/2018   Procedure: BOTOX  INJECTION;  Surgeon: Nannette Babe, MD;  Location: [facility] ENDOSCOPY;  Service: Gastroenterology;  Laterality: N/A;   BOTOX  INJECTION N/A 06/30/2019   Procedure: BOTOX  INJECTION;  Surgeon: Nannette Babe, MD;  Location: [facility] ENDOSCOPY;  Service: Gastroenterology;  Laterality: N/A;   BOTOX  INJECTION N/A 08/05/2020   Procedure: BOTOX  INJECTION;  Surgeon: Nannette Babe, MD;  Location: [facility] ENDOSCOPY;  Service: Gastroenterology;  Laterality: N/A;   BOTOX  INJECTION N/A 06/16/2021   Procedure: BOTOX  INJECTION;  Surgeon: Nannette Babe, MD;  Location: [facility] ENDOSCOPY;  Service: Gastroenterology;  Laterality: N/A;   BOTOX  INJECTION N/A 04/06/2022   Procedure: BOTOX  INJECTION;  Surgeon: Nannette Babe, MD;  Location: [facility] ENDOSCOPY;  Service: Gastroenterology;  Laterality: N/A;   BREAST BIOPSY Left    neg   BREAST SURGERY Left 2002   bx of skin   CHOLECYSTECTOMY     COLONOSCOPY     DIAGNOSTIC LAPAROSCOPY     ESOPHAGEAL MANOMETRY N/A 12/16/2012   Procedure: ESOPHAGEAL MANOMETRY (EM);  Surgeon: Nannette Babe, MD;  [facility]: [facility] ENDOSCOPY;  Service: Gastroenterology;  Laterality: N/A;   ESOPHAGOGASTRODUODENOSCOPY (EGD) WITH PROPOFOL  N/A 07/29/2013   Procedure: ESOPHAGOGASTRODUODENOSCOPY (EGD) WITH PROPOFOL ;  Surgeon: Nannette Babe, MD;  Location: [facility] ENDOSCOPY;  Service: Gastroenterology;  Laterality: N/A;  with botox  injection   ESOPHAGOGASTRODUODENOSCOPY (EGD) WITH PROPOFOL   N/A 05/04/2015   Procedure: ESOPHAGOGASTRODUODENOSCOPY (EGD) WITH PROPOFOL ;  Surgeon: Nannette Babe, MD;  [facility]: [facility] ENDOS[facility];  Service: Gastroenterology;  Laterality: N/A;   ESOPHAGOGASTRODUODENOSCOPY (EGD) WITH PROPOFOL  N/A 02/18/2018   Procedure: ESOPHAGOGASTRODUODENOSCOPY (EGD) WITH PROPOFOL ;  Surgeon: Nannette Babe, MD;  [facility]: [facility]OS[facility];  Service: Gastroenterology;  Laterality: N/A;   ESOPHAGOGASTRODUODENOSCOPY (EGD) WITH PROPOFOL  N/A 06/30/2019   Procedure: ESOPHAGOGASTRODUODENOSCOPY (EGD) WITH PROPOFOL ;  Surgeon: Nannette Babe, MD;  [facility]OS[facility];  Service: Gastroenterology;  Laterality: N/A;   ESOPHAGOGASTRODUODENOSCOPY (EGD) WITH PROPOFOL  N/A 08/05/2020   Procedure: ESOPHAGOGASTRODUODENOSCOPY (EGD) WITH PROPOFOL ;  Surgeon: Nannette Babe, MD;  [facility]: [facility]OS[facility];  Service: Gastroenterology;  Laterality: N/A;   ESOPHAGOGASTRODUODENOSCOPY (EGD) WITH PROPOFOL  N/A 06/16/2021   Procedure: ESOPHAGOGASTRODUODENOSCOPY (EGD) WITH PROPOFOL ;  Surgeon: Nannette Babe, MD;  [facility]: [facility] ENDOS[facility];  Service: Gastroenterology;  Laterality: N/A;   ESOPHAGOGASTRODUODENOSCOPY (EGD) WITH PROPOFOL  N/A 04/06/2022   Procedure: ESOPHAGOGASTRODUODENOSCOPY (EGD) WITH PROPOFOL ;  Surgeon: Nannette Babe, MD;  Location: [facility] ENDOSCOPY;  Service: Gastroenterology;  Laterality: N/A;   EYE SURGERY     Catarct surgery 2014   Eye Surgery AS Child Left    INCISION AND DRAINAGE OF WOUND N/A 02/21/2017   Procedure: IRRIGATION AND DEBRIDEMENT WOUND NECK;  Surgeon: Thornell Flirt, DO;  Location: [facility] OR;  Service: Plastics;  Laterality: N/A;   JEJUNOSTOMY FEEDING TUBE     x2 both failed. no longer has   LUMBAR LAMINECTOMY/DECOMPRESSION MICRODISCECTOMY Bilateral 05/13/2018   Procedure: Laminectomy and Foraminotomy - Lumbar four-Lumbar five- bilateral;  Surgeon: Isadora Mar, MD;  Location: [facility] OR;  Service: Neurosurgery;  Laterality: Bilateral;   LUMBAR LAMINECTOMY/DECOMPRESSION MICRODISCECTOMY Bilateral  11/08/2020   Procedure: Laminectomy and Foraminotomy - bilateral - Lumbar two-Lumbar three - Lumbar three-Lumbar four;  Surgeon: Isadora Mar, MD;  Location: [facility] OR;  Service: Neurosurgery;  Laterality: Bilateral;   MULTIPLE TOOTH EXTRACTIONS     2 teeth removed   OOPHORECTOMY     POSTERIOR CERVICAL FUSION/FORAMINOTOMY N/A 01/10/2017   Procedure: LAMINECTOMY AND FORAMINOTOMY CERVICAL FOUR-CERVICAL FIVE, CERVICAL FIVE-SIX POSTERIOR CERVICAL INSTRUMENT FUSION CERVICAL THREE-CERVICAL SEVEN,CERVICAL LAMINECTOMY CERVICAL THREE-CERVICAL SEVEN.;  Surgeon: Isadora Mar, MD;  Location: [facility] OR;  Service: Neurosurgery;  Laterality: N/A;  posterior   TONSILLECTOMY     TRACHEOSTOMY  1996   done at [facility], Dr. Tommye Franc   TRACHEOSTOMY REVISION N/A 09/14/2021   Procedure: TRACHEOSTOMA REVISION, TRACHEOPLASTY;  Surgeon: Mellody Sprout, MD;  Location: [facility] ORS;  Service: ENT;  Laterality: N/A;   TUBAL LIGATION     VIDEO BRONCHOSCOPY Bilateral 11/20/2012   Procedure: VIDEO BRONCHOSCOPY WITH FLUORO;  Surgeon: Marine Sia, MD;  Location: [facility] ENDOSCOPY;  Service: Cardiopulmonary;  Laterality: Bilateral;    Prior to Admission medications   Medication Sig Start Date End Date Taking? Authorizing Provider  acetaminophen  (TYLENOL ) 500 MG tablet Take  1,000 mg by mouth 2 (two) times daily as needed for moderate pain or headache.   Yes [provider]  cefadroxil  (DURICEF) 500 MG capsule Take 1 capsule (500 mg total) by mouth daily. 01/26/23  Yes Kent Pear, MD  cetirizine  (ZYRTEC ) 10 MG tablet TAKE 1 TABLET BY MOUTH EVERY DAY (NOT COVERED 04/02/23  Yes Kent Pear, MD  diphenhydrAMINE  (BENADRYL ) 25 MG tablet Take 50 mg by mouth daily as needed for allergies (Asthma).   Yes [provider]  Dupilumab 300 MG/2ML SOAJ Inject into the skin every 14 (fourteen) days. 01/19/23  Yes [provider]  estradiol  (ESTRACE ) 1 MG tablet TAKE 1 TABLET BY MOUTH EVERY DAY 10/23/22  Yes Kent Pear, MD  furosemide  (LASIX ) 20 MG tablet TAKE 1 TABLET BY MOUTH EVERY DAY AS NEEDED 03/01/23  Yes Kent Pear, MD  ibuprofen  (ADVIL ,MOTRIN ) 200 MG tablet Take 400-800 mg by mouth daily as needed for headache or moderate pain.   Yes [provider]  ipratropium-albuterol  (DUONEB) 0.5-2.5 (3) MG/3ML SOLN Take 3 mLs by nebulization every 4 (four) hours as needed. Dx 496 03/08/17  Yes Valiant Gaul, MD  montelukast  (SINGULAIR ) 10 MG tablet TAKE 1 TABLET BY MOUTH EVERYDAY AT BEDTIME 03/07/23  Yes Kent Pear, MD  pantoprazole  (PROTONIX ) 40 MG tablet TAKE 1 TABLET BY MOUTH TWICE A DAY 06/28/22  Yes Graciella Lavender, PA  rizatriptan (MAXALT-MLT) 10 MG disintegrating tablet TAKE 1 TABLET (10 MG TOTAL) BY MOUTH DAILY AS NEEDED FOR MIGRAINE. MAY REPEAT FOR ONE DOSE 2 HOURS AFTER THE FIRST ONE IF NEEDED. 06/23/19  Yes Kent Pear, MD  albuterol  (VENTOLIN  HFA) 108 (90 Base) MCG/ACT inhaler Inhale 2 puffs into the lungs every 6 (six) hours as needed for wheezing or shortness of breath. 09/22/21   Marc Senior, MD  azelastine  (ASTELIN ) 0.1 % nasal spray Place 1 spray into both nostrils 2 (two) times daily. Use in each nostril as directed Patient taking differently: Place 1 spray into both nostrils 2 (two) times daily as needed for allergies. Use in each nostril as directed 12/04/19   Wilder Handy, MD  EPINEPHrine  0.3 mg/0.3 mL IJ SOAJ injection Inject 0.3 mg into the muscle as needed for anaphylaxis.     [provider]  Fluticasone -Umeclidin-Vilant (TRELEGY ELLIPTA ) 100-62.5-25 MCG/ACT AEPB Inhale 1 puff into the lungs daily. Patient taking differently: Inhale 1 puff into the lungs daily as needed (asthma). 02/17/22   Marc Senior, MD  guaiFENesin -codeine  100-10 MG/5ML syrup Take 5 mLs by mouth 3 (three) times daily as needed for cough. 01/31/23   Webb, Padonda B, FNP  methocarbamol  (ROBAXIN ) 500 MG tablet Take 500 mg by mouth every 8 (eight) hours as needed  for muscle spasms. Patient not taking: Reported on 02/21/2023 10/11/21   [provider]  ondansetron  (ZOFRAN -ODT) 4 MG disintegrating tablet Take 1 tablet (4 mg total) by mouth every 6 (six) hours as needed for nausea or vomiting. 02/23/22   Pyrtle, Amber Bail, MD  predniSONE  (DELTASONE ) 10 MG tablet TAKE 3 TABLETS PO QD FOR 3 DAYS THEN TAKE 2 TABLETS PO QD FOR 3 DAYS THEN TAKE 1 TABLET PO QD FOR 3 DAYS THEN TAKE 1/2 TAB PO QD FOR 3 DAYS Patient not taking: Reported on 02/21/2023 10/23/22   Bluford Burkitt, NP  predniSONE  (DELTASONE ) 20 MG tablet Take 2 tablets (40 mg total) by mouth daily with breakfast. Patient not taking: Reported on 02/21/2023 01/09/23   Nelly Banco  G, MD  Pseudoephedrine HCl (SUDAFED PO) Take 1 tablet by mouth daily as needed (cold symptons).    [provider]  sodium chloride  (OCEAN) 0.65 % SOLN nasal spray Place 1 spray into both nostrils every 4 (four) hours as needed for congestion.     [provider]  White Petrolatum-Mineral Oil (LUBRICANT EYE) OINT Place 1 application into both eyes 2 (two) times daily as needed (for dry eyes).     [provider]  zolmitriptan  (ZOMIG -ZMT) 5 MG disintegrating tablet Take 1 tablet (5 mg total) by mouth daily as needed for migraine. 05/27/19 07/23/19  Kent Pear, MD    Allergies as of 03/14/2023 - Review Complete 02/21/2023  Allergen Reaction Noted   Asa [aspirin] Shortness Of Breath, Swelling, and Other (See Comments) 08/05/2012   Bee venom Anaphylaxis and Shortness Of Breath 02/08/2017   Doxycycline  Hives 09/25/2022   Epinephrine  Shortness Of Breath and Palpitations 10/23/2013   Feraheme  [ferumoxytol ] Shortness Of Breath 10/31/2017   Influenza vaccines Shortness Of Breath and Other (See Comments) 01/14/2013   Pfizer-biontech covid-19 vacc [covid-19 mrna vacc (moderna)] Anaphylaxis 08/19/2019   Quinolones Swelling and Other (See Comments) 07/01/2013   Shellfish allergy Anaphylaxis 08/13/2012    Celebrex [celecoxib] Nausea And Vomiting 08/22/2022   Ciprofloxacin  Swelling and Other (See Comments) 07/01/2013   Clarithromycin  Nausea Only 10/23/2013   Erythromycin Nausea Only 10/23/2013   Levaquin [levofloxacin in d5w] Swelling and Other (See Comments) 08/05/2012   Peanut-containing drug products Other (See Comments) 01/04/2017   Pineapple Swelling 06/16/2022   Septra  [sulfamethoxazole -trimethoprim ] Diarrhea 04/18/2013   Telithromycin Nausea Only 10/23/2013   Zanaflex [tizanidine] Other (See Comments) 02/08/2017   Adhesive [tape] Rash and Other (See Comments) 08/03/2016    Family History  Problem Relation Age of Onset   Asthma Cousin    COPD Cousin    Breast cancer Maternal Grandmother 47   Asthma Father    Kidney cancer Father    Arrhythmia Father    Lung cancer Father    Lung cancer Paternal Uncle    COPD Paternal Grandfather    Alcohol  abuse Mother    Breast cancer Maternal Aunt 55   Bladder Cancer Neg Hx    Colon cancer Neg Hx    Esophageal cancer Neg Hx    Pancreatic cancer Neg Hx    Stomach cancer Neg Hx    Liver disease Neg Hx     Social History   Socioeconomic History   Marital status: Married    Spouse name: Not on file   Number of children: 4   Years of education: Not on file   Highest education level: Some college, no degree  Occupational History   Not on file  Tobacco Use   Smoking status: Never   Smokeless tobacco: Never  Vaping Use   Vaping status: Never Used  Substance and Sexual Activity   Alcohol  use: Yes    Comment: occ wine   Drug use: No   Sexual activity: Yes  Other Topics Concern   Not on file  Social History Narrative   Lives in [place] with husband.  She has four children.   Retired from [employer] of [employer]: Low Risk  (01/08/2023)   Overall Financial Resource Strain (CARDIA)    Difficulty of Paying Living Expenses: Not hard at all  Food Insecurity: No Food Insecurity  (01/08/2023)   Hunger Vital Sign  Worried About Programme researcher, broadcasting/film/video in the Last Year: Never true    Ran Out of Food in the Last Year: Never true  Transportation Needs: No Transportation Needs (01/08/2023)   PRAPARE - Administrator, Civil Service (Medical): No    Lack of Transportation (Non-Medical): No  Physical Activity: Insufficiently Active (01/08/2023)   Exercise Vital Sign    Days of Exercise per Week: 4 days    Minutes of Exercise per Session: 20 min  Stress: No Stress Concern Present (01/08/2023)   [employer] of Occupational Health - Occupational Stress Questionnaire    Feeling of Stress : Not at all  Social Connections: Socially Integrated (01/08/2023)   Social Connection and Isolation Panel [NHANES]    Frequency of Communication with Friends and Family: More than three times a week    Frequency of Social Gatherings with Friends and Family: More than three times a week    Attends Religious Services: More than 4 times per year    Active Member of [employer] or Organizations: Yes    Attends Engineer, structural: More than 4 times per year    Marital Status: Married  Catering manager Violence: Not At Risk (02/02/2022)   Humiliation, Afraid, Rape, and Kick questionnaire    Fear of Current or Ex-Partner: No    Emotionally Abused: No    Physically Abused: No    Sexually Abused: No    Review of Systems: See HPI, otherwise negative ROS  Physical Exam: Ht 5\' 3"  (1.6 m)   Wt 46.3 kg   BMI 18.07 kg/m  General:   Alert,  pleasant and cooperative in NAD Head:  Normocephalic and atraumatic. Lungs:  Clear to auscultation.    Heart:  Regular rate and rhythm.   Impression/Plan: Kevin G Meyering is here for ophthalmic surgery.  Risks, benefits, limitations, and alternatives regarding ophthalmic surgery have been reviewed with the patient.  Questions have been answered.  All parties agreeable.   Annell Kidney, MD  04/11/2023, 8:27 AM

## 2023-04-11 NOTE — Discharge Instructions (Signed)

## 2023-04-11 NOTE — Anesthesia Preprocedure Evaluation (Addendum)
 Anesthesia Evaluation  Patient identified by MRN, date of birth, ID band Patient awake    Reviewed: Allergy & Precautions, H&P , NPO status , Patient's Chart, lab work & pertinent test results  History of Anesthesia Complications (+) PONV and history of anesthetic complications  Airway Mallampati: I  TM Distance: >3 FB Neck ROM: Full    Dental  (+) Upper Dentures   Pulmonary neg pulmonary ROS, shortness of breath and with exertion, asthma , COPD,  COPD inhaler   breath sounds clear to auscultation       Cardiovascular Exercise Tolerance: Good negative cardio ROS + Valvular Problems/Murmurs  Rhythm:Regular Rate:Normal     Neuro/Psych  Headaches, Seizures -,   Anxiety      Neuromuscular disease CVA negative neurological ROS  negative psych ROS   GI/Hepatic negative GI ROS, Neg liver ROS, hiatal hernia,,,  Endo/Other  negative endocrine ROS    Renal/GU Renal diseasenegative Renal ROS  negative genitourinary   Musculoskeletal negative musculoskeletal ROS (+) Arthritis ,    Abdominal   Peds negative pediatric ROS (+)  Hematology negative hematology ROS (+) Blood dyscrasia, anemia   Anesthesia Other Findings   Reproductive/Obstetrics negative OB ROS                             Anesthesia Physical Anesthesia Plan  ASA: 3  Anesthesia Plan: MAC   Post-op Pain Management:    Induction: Intravenous  PONV Risk Score and Plan: 3  Airway Management Planned: Natural Airway and Nasal Cannula  Additional Equipment:   Intra-op Plan:   Post-operative Plan:   Informed Consent: I have reviewed the patients History and Physical, chart, labs and discussed the procedure including the risks, benefits and alternatives for the proposed anesthesia with the patient or authorized representative who has indicated his/her understanding and acceptance.       Plan Discussed with:   Anesthesia Plan  Comments:        Anesthesia Quick Evaluation

## 2023-04-11 NOTE — Anesthesia Postprocedure Evaluation (Signed)
 Anesthesia Post Note  Patient: YARIELA HARSEY  Procedure(s) Performed: CATARACT EXTRACTION PHACO AND INTRAOCULAR LENS PLACEMENT (IOC) RIGHT 8.77, 00:36.3 (Right: Eye)  Patient location during evaluation: PACU Anesthesia Type: MAC Level of consciousness: awake and alert Pain management: pain level controlled Vital Signs Assessment: post-procedure vital signs reviewed and stable Respiratory status: spontaneous breathing, nonlabored ventilation, respiratory function stable and patient connected to nasal cannula oxygen Cardiovascular status: stable and blood pressure returned to baseline Postop Assessment: no apparent nausea or vomiting Anesthetic complications: no   No notable events documented.   Last Vitals:  Vitals:   04/11/23 0925 04/11/23 0928  BP:  (!) 107/57  Pulse: 60 (!) 59  Resp: 15 13  Temp:  (!) 36.4 C  SpO2: 100% 99%    Last Pain:  Vitals:   04/11/23 0928  TempSrc:   PainSc: 0-No pain                 Emilie Harden

## 2023-04-12 ENCOUNTER — Encounter: Payer: Self-pay | Admitting: Ophthalmology

## 2023-04-19 ENCOUNTER — Ambulatory Visit: Payer: Self-pay | Admitting: Family Medicine

## 2023-04-19 ENCOUNTER — Telehealth: Payer: Self-pay | Admitting: Emergency Medicine

## 2023-04-19 ENCOUNTER — Ambulatory Visit
Admission: EM | Admit: 2023-04-19 | Discharge: 2023-04-19 | Disposition: A | Discharge status: Home / Self Care | Payer: Medicare Other | Attending: Emergency Medicine | Admitting: Emergency Medicine

## 2023-04-19 DIAGNOSIS — R062 Wheezing: Secondary | ICD-10-CM | POA: Diagnosis not present

## 2023-04-19 DIAGNOSIS — J101 Influenza due to other identified influenza virus with other respiratory manifestations: Secondary | ICD-10-CM | POA: Diagnosis not present

## 2023-04-19 DIAGNOSIS — J4521 Mild intermittent asthma with (acute) exacerbation: Secondary | ICD-10-CM

## 2023-04-19 LAB — POCT INFLUENZA A/B
Influenza A, POC: POSITIVE — AB
Influenza B, POC: NEGATIVE

## 2023-04-19 MED ORDER — GUAIFENESIN-CODEINE 100-10 MG/5ML PO SOLN: 5.0000 mL | Freq: Four times a day (QID) | ORAL | 0 refills | Status: DC | PRN

## 2023-04-19 MED ORDER — PREDNISONE 10 MG (21) PO TBPK: ORAL_TABLET | Freq: Every day | ORAL | 0 refills | Status: DC

## 2023-04-19 MED ORDER — OSELTAMIVIR PHOSPHATE 75 MG PO CAPS: 75.0000 mg | ORAL_CAPSULE | Freq: Two times a day (BID) | ORAL | 0 refills | Status: DC

## 2023-04-19 NOTE — Telephone Encounter (Signed)
Copied from CRM [id number]. Topic: Clinical - Red Word Triage >> Apr 19, 2023  9:58 AM Anne [name] wrote: Red Word that prompted transfer to Nurse Triage:  Patient having upper respiratory congestion started monday night, sore throat, Body aches runny nose tested negative for covid. Has been taking over the counter medications. Fever today of 101.3   Chief Complaint: Congestion, Cough Symptoms: Cough, Fever, Congestion, Head Pressure Frequency: 5-7 Days Pertinent Negatives: Patient denies chest pain or shortness of breath.  Disposition: [] ED /[x] Urgent Care (no appt availability in office) / [] Appointment(In office/virtual)/ []  [facility]/ [] Home Care/ [] Refused Recommended Disposition /[] [facility]/ []  Follow-up with PCP Additional Notes: Anne [name] is a 73 year old female being triaged today for a cough, fever, congestion, and body aches. The patient has tested negative twice for COVID. Due to symptoms and lack of in office availability, referred patient to nearest [facility]. Patient verbalized understanding.    Reason for Disposition  [1] Fever > 100.0 F (37.8 C) AND [2] surgery in the last month  Answer Assessment - Initial Assessment Questions 1. TEMPERATURE: "What is the most recent temperature?"  "How was it measured?"      101.3, This morning  2. ONSET: "When did the fever start?"      Since Monday Night  3. CHILLS: "Do you have chills?" If yes: "How bad are they?"  (e.g., none, mild, moderate, severe)   - NONE: no chills   - MILD: feeling cold   - MODERATE: feeling very cold, some shivering (feels better under a thick blanket)   - SEVERE: feeling extremely cold with shaking chills (general body shaking, rigors; even under a thick blanket)      Mild  4. OTHER SYMPTOMS: "Do you have any other symptoms besides the fever?"  (e.g., abdomen pain, cough, diarrhea, earache, headache, sore throat, urination pain)     Cough, Fever, Congestion, Head Pressure, Body  Aches  5. CAUSE: If there are no symptoms, ask: "What do you think is causing the fever?"      Infection  6. CONTACTS: "Does anyone else in the family have an infection?"     No  7. TREATMENT: "What have you done so far to treat this fever?" (e.g., medications)     Tylenol  8. IMMUNOCOMPROMISE: "Do you have of the following: diabetes, HIV positive, splenectomy, cancer chemotherapy, chronic steroid treatment, transplant patient, etc."     Recent steroid treatment  with prednisone  9. PREGNANCY: "Is there any chance you are pregnant?" "When was your last menstrual period?"     No  10. TRAVEL: "Have you traveled out of the country in the last month?" (e.g., travel history, exposures)       No  Protocols used: [facility]

## 2023-04-19 NOTE — Telephone Encounter (Signed)
Pt was advised to be seen at [facility] urgent care.

## 2023-04-19 NOTE — ED Provider Notes (Addendum)
Anne [name]    CSN: [id number] Arrival date & time: 04/19/23  1053      History   Chief Complaint Chief Complaint  Patient presents with   Cough   Fever    HPI Anne [name] is a 73 y.o. female.   Patient presents for evaluation of fever peaking at 101.3, nasal congestion, productive cough, bilateral mild ear fullness and nausea without vomiting present for 3 days.  Associated mild wheezing but denies shortness of breath.  Associated general malaise and fatigue.  Home COVID test negative x 2.  No known sick contacts.  Has attempted use of Tylenol and guaifenesin codeine cough syrup but has run out of medicine.  Of asthma.  Past Medical History:  Diagnosis Date   Achalasia of esophagus    Allergic rhinitis    Allergy    multiple, mostly aspirin, levaquin and shellfish.   Anemia    Anxiety    Arthritis    Asthma    Bilateral leg edema    Cataract 2014   exraction with len implant   Chronic pain syndrome    Chronic pulmonary aspiration    Complication of anesthesia    Breathing problems upon waking up. 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   Compressed cervical disc    COPD (chronic obstructive pulmonary disease) (HCC)    CVA (cerebral infarction)    Dyspnea    Esophageal dysmotility    Gastritis    H/O food anaphylaxis    Heart murmur    as child   History of hiatal hernia    Hypokalemia    IBS (irritable bowel syndrome)    Kidney lesion 2018   Left   Liver lesion    Migraine    PICC (peripherally inserted central catheter) removal 02/20/2017   PONV (postoperative nausea and vomiting)    Problems with swallowing    intermittently   Pulmonary fibrosis (HCC)    Renal cell carcinoma (HCC)    small- left kidney   S/P cervical spinal fusion 01/10/2017   Seizures (HCC)    only in elementary school-was on medication and was tx up until 5th grade and has had no more seizures since then   Shingles    Stroke [facility])    slurred  speech, drawn face, imaging normal, occurred twice, [facility]-"TIA" if antything" 02/20/17-no residual effects   Tracheal granulation    Tracheostomy in place [facility])    Vocal cord paresis     Patient Active Problem List   Diagnosis Date Noted   Cough 01/09/2023   UTI (urinary tract infection) 11/14/2022   Rash and nonspecific skin eruption 10/23/2022   Allergy to pineapple 06/16/2022   Arthralgia 02/10/2022   Neck pain 11/07/2021   Arthritis 09/06/2021   Stress 03/11/2021   Neuropathy 08/17/2020   Scalp lesion 08/17/2020   History of anaphylaxis 04/18/2019   Liver lesion 10/11/2018   S/P lumbar laminectomy 05/13/2018   Low back pain 02/07/2018   Iron deficiency anemia 12/11/2017   Mild persistent asthma 10/10/2017   Anxiety 08/03/2017   Current long-term use of postmenopausal hormone replacement therapy 03/19/2017   Kidney lesion 12/13/2016   Migraine 09/12/2016   Esophageal dysphagia 06/19/2016   Skin nodule 01/10/2016   Degenerative arthritis of cervical spine 10/28/2015   Gastritis    B12 deficiency 03/15/2015   Slow transit constipation 09/03/2014   History of TIA (transient ischemic attack) 06/03/2014   Sinusitis 04/30/2014   Hypertensive lower esophageal sphincter  07/29/2013   Achalasia of esophagus 07/29/2013   Insomnia 12/13/2012   HLD (hyperlipidemia) 12/13/2012   Pulmonary fibrosis (HCC) 08/05/2012    Past Surgical History:  Procedure Laterality Date   ABDOMINAL HYSTERECTOMY     APPLICATION OF A-CELL OF HEAD/NECK N/A 02/21/2017   Procedure: APPLICATION OF A-CELL OF HEAD/NECK;  Surgeon: Peggye Form, DO;  Location: [facility] OR;  Service: Plastics;  Laterality: N/A;   BACK SURGERY     BIOPSY  04/06/2022   Procedure: BIOPSY;  Surgeon: Beverley Fiedler, MD;  Location: [facility] ENDOSCOPY;  Service: Gastroenterology;;   BOTOX INJECTION N/A 07/29/2013   Procedure: BOTOX INJECTION;  Surgeon: Beverley Fiedler, MD;  Location: [facility] ENDOSCOPY;  Service: Gastroenterology;  Laterality:  N/A;   BOTOX INJECTION N/A 05/04/2015   Procedure: BOTOX INJECTION;  Surgeon: Beverley Fiedler, MD;  Location: [facility] ENDOSCOPY;  Service: Gastroenterology;  Laterality: N/A;   BOTOX INJECTION N/A 02/18/2018   Procedure: BOTOX INJECTION;  Surgeon: Beverley Fiedler, MD;  Location: [facility] ENDOSCOPY;  Service: Gastroenterology;  Laterality: N/A;   BOTOX INJECTION N/A 06/30/2019   Procedure: BOTOX INJECTION;  Surgeon: Beverley Fiedler, MD;  Location: [facility] ENDOSCOPY;  Service: Gastroenterology;  Laterality: N/A;   BOTOX INJECTION N/A 08/05/2020   Procedure: BOTOX INJECTION;  Surgeon: Beverley Fiedler, MD;  Location: [facility] ENDOSCOPY;  Service: Gastroenterology;  Laterality: N/A;   BOTOX INJECTION N/A 06/16/2021   Procedure: BOTOX INJECTION;  Surgeon: Beverley Fiedler, MD;  Location: [facility] ENDOSCOPY;  Service: Gastroenterology;  Laterality: N/A;   BOTOX INJECTION N/A 04/06/2022   Procedure: BOTOX INJECTION;  Surgeon: Beverley Fiedler, MD;  Location: [facility] ENDOSCOPY;  Service: Gastroenterology;  Laterality: N/A;   BREAST BIOPSY Left    neg   BREAST SURGERY Left 2002   bx of skin   CATARACT EXTRACTION W/PHACO Right 04/11/2023   Procedure: CATARACT EXTRACTION PHACO AND INTRAOCULAR LENS PLACEMENT (IOC) RIGHT 8.77, 00:36.3;  Surgeon: Lockie Mola, MD;  Location: [facility] [place] [facility];  Service: Ophthalmology;  Laterality: Right;   CHOLECYSTECTOMY     COLONOSCOPY     DIAGNOSTIC LAPAROSCOPY     ESOPHAGEAL MANOMETRY N/A 12/16/2012   Procedure: ESOPHAGEAL MANOMETRY (EM);  Surgeon: Beverley Fiedler, MD;  Location: [facility] ENDOSCOPY;  Service: Gastroenterology;  Laterality: N/A;   ESOPHAGOGASTRODUODENOSCOPY (EGD) WITH PROPOFOL N/A 07/29/2013   Procedure: ESOPHAGOGASTRODUODENOSCOPY (EGD) WITH PROPOFOL;  Surgeon: Beverley Fiedler, MD;  Location: [facility] ENDOSCOPY;  Service: Gastroenterology;  Laterality: N/A;  with botox injection   ESOPHAGOGASTRODUODENOSCOPY (EGD) WITH PROPOFOL N/A 05/04/2015   Procedure: ESOPHAGOGASTRODUODENOSCOPY (EGD) WITH PROPOFOL;   Surgeon: Beverley Fiedler, MD;  Location: [facility] ENDOSCOPY;  Service: Gastroenterology;  Laterality: N/A;   ESOPHAGOGASTRODUODENOSCOPY (EGD) WITH PROPOFOL N/A 02/18/2018   Procedure: ESOPHAGOGASTRODUODENOSCOPY (EGD) WITH PROPOFOL;  Surgeon: Beverley Fiedler, MD;  Location: [facility] ENDOSCOPY;  Service: Gastroenterology;  Laterality: N/A;   ESOPHAGOGASTRODUODENOSCOPY (EGD) WITH PROPOFOL N/A 06/30/2019   Procedure: ESOPHAGOGASTRODUODENOSCOPY (EGD) WITH PROPOFOL;  Surgeon: Beverley Fiedler, MD;  Location: [facility] ENDOSCOPY;  Service: Gastroenterology;  Laterality: N/A;   ESOPHAGOGASTRODUODENOSCOPY (EGD) WITH PROPOFOL N/A 08/05/2020   Procedure: ESOPHAGOGASTRODUODENOSCOPY (EGD) WITH PROPOFOL;  Surgeon: Beverley Fiedler, MD;  Location: [facility] ENDOSCOPY;  Service: Gastroenterology;  Laterality: N/A;   ESOPHAGOGASTRODUODENOSCOPY (EGD) WITH PROPOFOL N/A 06/16/2021   Procedure: ESOPHAGOGASTRODUODENOSCOPY (EGD) WITH PROPOFOL;  Surgeon: Beverley Fiedler, MD;  Location: [facility] ENDOSCOPY;  Service: Gastroenterology;  Laterality: N/A;   ESOPHAGOGASTRODUODENOSCOPY (EGD) WITH PROPOFOL N/A 04/06/2022   Procedure: ESOPHAGOGASTRODUODENOSCOPY (EGD) WITH PROPOFOL;  Surgeon: Erick Blinks  M, MD;  Location: [facility] ENDOSCOPY;  Service: Gastroenterology;  Laterality: N/A;   EYE SURGERY     Catarct surgery 2014   Eye Surgery AS Child Left    INCISION AND DRAINAGE OF WOUND N/A 02/21/2017   Procedure: IRRIGATION AND DEBRIDEMENT WOUND NECK;  Surgeon: Peggye Form, DO;  Location: [facility] OR;  Service: Plastics;  Laterality: N/A;   JEJUNOSTOMY FEEDING TUBE     x2 both failed. no longer has   LUMBAR LAMINECTOMY/DECOMPRESSION MICRODISCECTOMY Bilateral 05/13/2018   Procedure: Laminectomy and Foraminotomy - Lumbar four-Lumbar five- bilateral;  Surgeon: Tia Alert, MD;  Location: [facility] OR;  Service: Neurosurgery;  Laterality: Bilateral;   LUMBAR LAMINECTOMY/DECOMPRESSION MICRODISCECTOMY Bilateral 11/08/2020   Procedure: Laminectomy and Foraminotomy - bilateral - Lumbar  two-Lumbar three - Lumbar three-Lumbar four;  Surgeon: Tia Alert, MD;  Location: [facility] OR;  Service: Neurosurgery;  Laterality: Bilateral;   MULTIPLE TOOTH EXTRACTIONS     2 teeth removed   OOPHORECTOMY     POSTERIOR CERVICAL FUSION/FORAMINOTOMY N/A 01/10/2017   Procedure: LAMINECTOMY AND FORAMINOTOMY CERVICAL FOUR-CERVICAL FIVE, CERVICAL FIVE-SIX POSTERIOR CERVICAL INSTRUMENT FUSION CERVICAL THREE-CERVICAL SEVEN,CERVICAL LAMINECTOMY CERVICAL THREE-CERVICAL SEVEN.;  Surgeon: Tia Alert, MD;  Location: [facility] OR;  Service: Neurosurgery;  Laterality: N/A;  posterior   TONSILLECTOMY     TRACHEOSTOMY  1996   done at [facility], Dr. Elenore Rota   TRACHEOSTOMY REVISION N/A 09/14/2021   Procedure: TRACHEOSTOMA REVISION, TRACHEOPLASTY;  Surgeon: Vernie Murders, MD;  Location: [facility] ORS;  Service: ENT;  Laterality: N/A;   TUBAL LIGATION     VIDEO BRONCHOSCOPY Bilateral 11/20/2012   Procedure: VIDEO BRONCHOSCOPY WITH FLUORO;  Surgeon: Lupita Leash, MD;  Location: [facility] ENDOSCOPY;  Service: Cardiopulmonary;  Laterality: Bilateral;    OB History   No obstetric history on file.      Home Medications    Prior to Admission medications   Medication Sig Start Date End Date Taking? Authorizing Provider  guaiFENesin-codeine 100-10 MG/5ML syrup Take 5 mLs by mouth every 6 (six) hours as needed for cough. 04/19/23  Yes Machael Raine, Elita Boone, NP  oseltamivir (TAMIFLU) 75 MG capsule Take 1 capsule (75 mg total) by mouth every 12 (twelve) hours. 04/19/23  Yes Jhoselin Crume R, NP  predniSONE (STERAPRED UNI-PAK 21 TAB) 10 MG (21) TBPK tablet Take by mouth daily. Take 6 tabs by mouth daily  for 1 days, then 5 tabs for 1 days, then 4 tabs for 1 days, then 3 tabs for 1 days, 2 tabs for 1 days, then 1 tab by mouth daily for 1 days 04/19/23  Yes Abisola Carrero R, NP  acetaminophen (TYLENOL) 500 MG tablet Take 1,000 mg by mouth 2 (two) times daily as needed for moderate pain or headache.    [provider]  albuterol  (VENTOLIN HFA) 108 (90 Base) MCG/ACT inhaler Inhale 2 puffs into the lungs every 6 (six) hours as needed for wheezing or shortness of breath. 09/22/21   Salena Saner, MD  azelastine (ASTELIN) 0.1 % nasal spray Place 1 spray into both nostrils 2 (two) times daily. Use in each nostril as directed Patient taking differently: Place 1 spray into both nostrils 2 (two) times daily as needed for allergies. Use in each nostril as directed 12/04/19   Coralyn Helling, MD  cefadroxil (DURICEF) 500 MG capsule Take 1 capsule (500 mg total) by mouth daily. 01/26/23   Glori Luis, MD  cetirizine (ZYRTEC) 10 MG tablet TAKE 1 TABLET BY MOUTH EVERY DAY (NOT COVERED 04/02/23  Glori Luis, MD  diphenhydrAMINE (BENADRYL) 25 MG tablet Take 50 mg by mouth daily as needed for allergies (Asthma).    [provider]  Dupilumab 300 MG/2ML SOAJ Inject into the skin every 14 (fourteen) days. 01/19/23   [provider]  EPINEPHrine 0.3 mg/0.3 mL IJ SOAJ injection Inject 0.3 mg into the muscle as needed for anaphylaxis.     [provider]  estradiol (ESTRACE) 1 MG tablet TAKE 1 TABLET BY MOUTH EVERY DAY 10/23/22   Glori Luis, MD  Fluticasone-Umeclidin-Vilant (TRELEGY ELLIPTA) 100-62.5-25 MCG/ACT AEPB Inhale 1 puff into the lungs daily. Patient taking differently: Inhale 1 puff into the lungs daily as needed (asthma). 02/17/22   Salena Saner, MD  furosemide (LASIX) 20 MG tablet TAKE 1 TABLET BY MOUTH EVERY DAY AS NEEDED 03/01/23   Glori Luis, MD  ibuprofen (ADVIL,MOTRIN) 200 MG tablet Take 400-800 mg by mouth daily as needed for headache or moderate pain.    [provider]  ipratropium-albuterol (DUONEB) 0.5-2.5 (3) MG/3ML SOLN Take 3 mLs by nebulization every 4 (four) hours as needed. Dx 496 03/08/17   Merwyn Katos, MD  methocarbamol (ROBAXIN) 500 MG tablet Take 500 mg by mouth every 8 (eight) hours as needed for muscle spasms. Patient not taking: Reported on  02/21/2023 10/11/21   [provider]  montelukast (SINGULAIR) 10 MG tablet TAKE 1 TABLET BY MOUTH EVERYDAY AT BEDTIME 03/07/23   Glori Luis, MD  ondansetron (ZOFRAN-ODT) 4 MG disintegrating tablet Take 1 tablet (4 mg total) by mouth every 6 (six) hours as needed for nausea or vomiting. 02/23/22   Pyrtle, Carie Caddy, MD  pantoprazole (PROTONIX) 40 MG tablet TAKE 1 TABLET BY MOUTH TWICE A DAY 06/28/22   Unk Lightning, PA  Pseudoephedrine HCl (SUDAFED PO) Take 1 tablet by mouth daily as needed (cold symptons).    [provider]  rizatriptan (MAXALT-MLT) 10 MG disintegrating tablet TAKE 1 TABLET (10 MG TOTAL) BY MOUTH DAILY AS NEEDED FOR MIGRAINE. MAY REPEAT FOR ONE DOSE 2 HOURS AFTER THE FIRST ONE IF NEEDED. 06/23/19   Glori Luis, MD  sodium chloride (OCEAN) 0.65 % SOLN nasal spray Place 1 spray into both nostrils every 4 (four) hours as needed for congestion.     [provider]  Jaylei Fuerte Petrolatum-Mineral Oil (LUBRICANT EYE) OINT Place 1 application into both eyes 2 (two) times daily as needed (for dry eyes).     [provider]  zolmitriptan (ZOMIG-ZMT) 5 MG disintegrating tablet Take 1 tablet (5 mg total) by mouth daily as needed for migraine. 05/27/19 07/23/19  Glori Luis, MD    Family History Family History  Problem Relation Age of Onset   Asthma Cousin    COPD Cousin    Breast cancer Maternal Grandmother 73   Asthma Father    Kidney cancer Father    Arrhythmia Father    Lung cancer Father    Lung cancer Paternal Uncle    COPD Paternal Grandfather    Alcohol abuse Mother    Breast cancer Maternal Aunt 54   Bladder Cancer Neg Hx    Colon cancer Neg Hx    Esophageal cancer Neg Hx    Pancreatic cancer Neg Hx    Stomach cancer Neg Hx    Liver disease Neg Hx     Social History Social History   Tobacco Use   Smoking status: Never   Smokeless tobacco: Never  Vaping Use   Vaping  status: Never Used  Substance Use Topics    Alcohol use: Yes    Comment: occ wine   Drug use: No     Allergies   Asa [aspirin], Bee venom, Doxycycline, Epinephrine, Feraheme [ferumoxytol], Influenza vaccines, Pfizer-biontech covid-19 vacc [covid-19 mrna vacc (moderna)], Quinolones, Shellfish allergy, Celebrex [celecoxib], Ciprofloxacin, Clarithromycin, Erythromycin, Levaquin [levofloxacin in d5w], Peanut-containing drug products, Pineapple, Septra [sulfamethoxazole-trimethoprim], Telithromycin, Zanaflex [tizanidine], and Adhesive [tape]   Review of Systems Review of Systems   Physical Exam Triage Vital Signs ED Triage Vitals  Encounter Vitals Group     BP 04/19/23 1108 108/65     Systolic BP Percentile --      Diastolic BP Percentile --      Pulse Rate 04/19/23 1108 81     Resp 04/19/23 1108 15     Temp 04/19/23 1108 98.9 F (37.2 C)     Temp Source 04/19/23 1108 Oral     SpO2 04/19/23 1108 95 %     Weight --      Height --      Head Circumference --      Peak Flow --      Pain Score 04/19/23 1107 2     Pain Loc --      Pain Education --      Exclude from Growth Chart --    No data found.  Updated Vital Signs BP 108/65 (BP Location: Left Arm)   Pulse 81   Temp 98.9 F (37.2 C) (Oral)   Resp 15   SpO2 95%   Visual Acuity Right Eye Distance:   Left Eye Distance:   Bilateral Distance:    Right Eye Near:   Left Eye Near:    Bilateral Near:     Physical Exam Constitutional:      Appearance: Normal appearance.  HENT:     Head: Normocephalic.     Right Ear: Tympanic membrane, ear canal and external ear normal.     Left Ear: Tympanic membrane, ear canal and external ear normal.     Nose: Congestion present. No rhinorrhea.     Mouth/Throat:     Mouth: Mucous membranes are moist.     Pharynx: Oropharynx is clear. No oropharyngeal exudate or posterior oropharyngeal erythema.  Eyes:     Extraocular Movements: Extraocular movements intact.  Cardiovascular:     Rate and Rhythm: Normal rate and regular  rhythm.     Pulses: Normal pulses.     Heart sounds: Normal heart sounds.  Pulmonary:     Effort: Pulmonary effort is normal.     Breath sounds: Wheezing present.  Musculoskeletal:     Cervical back: Normal range of motion and neck supple.  Neurological:     Mental Status: She is alert and oriented to person, place, and time. Mental status is at baseline.      UC Treatments / Results  Labs (all labs ordered are listed, but only abnormal results are displayed) Labs Reviewed  POCT INFLUENZA A/B - Abnormal; Notable for the following components:      Result Value   Influenza A, POC Positive (*)    All other components within normal limits    EKG   Radiology No results found.  Procedures Procedures (including critical care time)  Medications Ordered in UC Medications - No data to display  Initial Impression / Assessment and Plan / UC Course  I have reviewed the triage vital signs and the nursing notes.  Pertinent labs & imaging results  that were available during my care of the patient were reviewed by me and considered in my medical decision making (see chart for details).  Wheezing, influenza A, mild intermittent asthma with acute exacerbation  Patient is in no signs of distress nor toxic appearing.  Vital signs are stable.  Low suspicion for pneumonia, pneumothorax or bronchitis and therefore will defer imaging.  prescribed Tamiflu, prednisone and guaifenesin codeine, PDMP reviewed, low risk.May use additional over-the-counter medications as needed for supportive care.  May follow-up with urgent care as needed if symptoms persist or worsen.   Final Clinical Impressions(s) / UC Diagnoses   Final diagnoses:  Wheezing  Influenza A     Discharge Instructions      Your symptoms today are most likely being caused by a virus and should steadily improve in time it can take up to 7 to 10 days before you truly start to see a turnaround however things will get better  Flu  Test is positive, take Tamiflu every morning and every evening for 5 days amount of virus in your body which ideally will help to calm your symptoms, does not fully take away illness  Begin prednisone every morning with food to open and relax the airway, should settle wheezing  May use cough syrup every 6 hours as needed for comfort    You can take Tylenol and/or Ibuprofen as needed for fever reduction and pain relief.   For cough: honey 1/2 to 1 teaspoon (you can dilute the honey in water or another fluid).  You can also use guaifenesin and dextromethorphan for cough. You can use a humidifier for chest congestion and cough.  If you don't have a humidifier, you can sit in the bathroom with the hot shower running.      For sore throat: try warm salt water gargles, cepacol lozenges, throat spray, warm tea or water with lemon/honey, popsicles or ice, or OTC cold relief medicine for throat discomfort.   For congestion: take a daily anti-histamine like Zyrtec, Claritin, and a oral decongestant, such as pseudoephedrine.  You can also use Flonase 1-2 sprays in each nostril daily.   It is important to stay hydrated: drink plenty of fluids (water, gatorade/powerade/pedialyte, juices, or teas) to keep your throat moisturized and help further relieve irritation/discomfort.    ED Prescriptions     Medication Sig Dispense Auth. Provider   predniSONE (STERAPRED UNI-PAK 21 TAB) 10 MG (21) TBPK tablet Take by mouth daily. Take 6 tabs by mouth daily  for 1 days, then 5 tabs for 1 days, then 4 tabs for 1 days, then 3 tabs for 1 days, 2 tabs for 1 days, then 1 tab by mouth daily for 1 days 21 tablet Bushra Denman R, NP   guaiFENesin-codeine 100-10 MG/5ML syrup Take 5 mLs by mouth every 6 (six) hours as needed for cough. 120 mL Kiaraliz Rafuse, Hansel Starling R, NP   oseltamivir (TAMIFLU) 75 MG capsule Take 1 capsule (75 mg total) by mouth every 12 (twelve) hours. 10 capsule Valinda Hoar, NP      I have reviewed the  PDMP during this encounter.   Valinda Hoar, NP 04/19/23 1200    Valinda Hoar, [place] 04/19/23 1201

## 2023-04-19 NOTE — ED Triage Notes (Signed)
Pt c/o scratchy throat, sneezing, coughing x 4 days. Pt states she has tested her self at home for COVID, results were neg. Has developed nasal congestion and fever. This morning she had a temperature of 101.3.   Home interventions: Tylenol

## 2023-04-19 NOTE — Telephone Encounter (Signed)
Patient was seen at urgent care.

## 2023-04-19 NOTE — Discharge Instructions (Addendum)
Your symptoms today are most likely being caused by a virus and should steadily improve in time it can take up to 7 to 10 days before you truly start to see a turnaround however things will get better  Flu Test is positive, take Tamiflu every morning and every evening for 5 days amount of virus in your body which ideally will help to calm your symptoms, does not fully take away illness  Begin prednisone every morning with food to open and relax the airway, should settle wheezing  May use cough syrup every 6 hours as needed for comfort    You can take Tylenol and/or Ibuprofen as needed for fever reduction and pain relief.   For cough: honey 1/2 to 1 teaspoon (you can dilute the honey in water or another fluid).  You can also use guaifenesin and dextromethorphan for cough. You can use a humidifier for chest congestion and cough.  If you don't have a humidifier, you can sit in the bathroom with the hot shower running.      For sore throat: try warm salt water gargles, cepacol lozenges, throat spray, warm tea or water with lemon/honey, popsicles or ice, or OTC cold relief medicine for throat discomfort.   For congestion: take a daily anti-histamine like Zyrtec, Claritin, and a oral decongestant, such as pseudoephedrine.  You can also use Flonase 1-2 sprays in each nostril daily.   It is important to stay hydrated: drink plenty of fluids (water, gatorade/powerade/pedialyte, juices, or teas) to keep your throat moisturized and help further relieve irritation/discomfort.

## 2023-04-19 NOTE — Telephone Encounter (Signed)
[facility] does not medication in stock, has been resent to different pharmacy

## 2023-04-20 ENCOUNTER — Other Ambulatory Visit: Payer: Self-pay | Admitting: Family Medicine

## 2023-04-20 DIAGNOSIS — N951 Menopausal and female climacteric states: Secondary | ICD-10-CM

## 2023-04-24 ENCOUNTER — Ambulatory Visit: Payer: Medicare Other | Admitting: Allergy & Immunology

## 2023-05-10 ENCOUNTER — Ambulatory Visit: Payer: Medicare Other | Admitting: Allergy & Immunology

## 2023-05-11 ENCOUNTER — Encounter: Payer: Self-pay | Admitting: Cardiovascular Disease

## 2023-05-14 ENCOUNTER — Telehealth: Payer: Self-pay

## 2023-05-14 NOTE — Telephone Encounter (Signed)
LM for patient. Ok to [employer] and schedule nurse visit for b12 injection if needed.

## 2023-05-14 NOTE — Telephone Encounter (Signed)
Copied from CRM [id number]. Topic: Clinical - Request for Lab/Test Order >> May 14, 2023 12:56 PM Chantha C wrote: Reason for CRM: Patient states the last vitamin B12 was 01/09/23 and usually has it monthly, would like to restart. Please advise and call back [phone], can leave a message.

## 2023-05-14 NOTE — Telephone Encounter (Signed)
It is ok to restart B12 injections 1000 mcg monthly.

## 2023-05-15 NOTE — Telephone Encounter (Signed)
Pt scheduled for b12 shot on 05/22/23

## 2023-05-22 ENCOUNTER — Ambulatory Visit (INDEPENDENT_AMBULATORY_CARE_PROVIDER_SITE_OTHER): Payer: Medicare Other

## 2023-05-22 DIAGNOSIS — E538 Deficiency of other specified B group vitamins: Secondary | ICD-10-CM

## 2023-05-22 MED ORDER — CYANOCOBALAMIN 1000 MCG/ML IJ SOLN
1000.0000 ug | Freq: Once | INTRAMUSCULAR | Status: AC
  Administered 2023-05-22: 1000 ug via INTRAMUSCULAR

## 2023-05-22 NOTE — Progress Notes (Signed)
 Patient presented for B 12 injection to left deltoid, patient voiced no concerns nor showed any signs of distress during injection.

## 2023-06-19 ENCOUNTER — Ambulatory Visit (INDEPENDENT_AMBULATORY_CARE_PROVIDER_SITE_OTHER): Payer: Medicare Other

## 2023-06-19 DIAGNOSIS — E538 Deficiency of other specified B group vitamins: Secondary | ICD-10-CM

## 2023-06-19 MED ORDER — CYANOCOBALAMIN 1000 MCG/ML IJ SOLN
1000.0000 ug | Freq: Once | INTRAMUSCULAR | Status: AC
  Administered 2023-06-19: 1000 ug via INTRAMUSCULAR

## 2023-06-19 NOTE — Progress Notes (Signed)
 Patient presented for B 12 injection to right deltoid, patient voiced no concerns nor showed any signs of distress during injection.

## 2023-07-10 ENCOUNTER — Ambulatory Visit: Payer: Medicare Other | Admitting: Family Medicine

## 2023-07-17 ENCOUNTER — Telehealth: Payer: Self-pay | Admitting: Family

## 2023-07-17 DIAGNOSIS — Z1231 Encounter for screening mammogram for malignant neoplasm of breast: Secondary | ICD-10-CM

## 2023-07-17 NOTE — Telephone Encounter (Signed)
 Spoke to pt informed her that her order for her mammogram has been ordered

## 2023-07-17 NOTE — Telephone Encounter (Signed)
 Copied from CRM [id number]. Topic: Appointments - Scheduling Inquiry for Clinic >> Jul 16, 2023  4:00 PM Anne [name] wrote: Reason for CRM: Patient called in stating she would like to be schedule a mammogram, would like for to be called when order has been sent   Patient will be seen by Bascom Bossier on 07/31/23.

## 2023-07-24 ENCOUNTER — Ambulatory Visit (INDEPENDENT_AMBULATORY_CARE_PROVIDER_SITE_OTHER)

## 2023-07-24 DIAGNOSIS — E538 Deficiency of other specified B group vitamins: Secondary | ICD-10-CM | POA: Diagnosis not present

## 2023-07-24 MED ORDER — CYANOCOBALAMIN 1000 MCG/ML IJ SOLN
1000.0000 ug | Freq: Once | INTRAMUSCULAR | Status: AC
  Administered 2023-07-24: 1000 ug via INTRAMUSCULAR

## 2023-07-24 NOTE — Progress Notes (Signed)
 Patient presented for B 12 injection to right deltoid, patient voiced no concerns nor showed any signs of distress during injection.

## 2023-07-31 ENCOUNTER — Ambulatory Visit: Admitting: Family

## 2023-08-06 ENCOUNTER — Encounter (HOSPITAL_COMMUNITY): Payer: Self-pay

## 2023-08-13 ENCOUNTER — Ambulatory Visit
Admission: RE | Admit: 2023-08-13 | Discharge: 2023-08-13 | Disposition: A | Discharge status: Home / Self Care | Source: Ambulatory Visit | Attending: Family | Admitting: Family

## 2023-08-13 DIAGNOSIS — Z1231 Encounter for screening mammogram for malignant neoplasm of breast: Secondary | ICD-10-CM | POA: Diagnosis present

## 2023-08-16 ENCOUNTER — Other Ambulatory Visit: Payer: Self-pay | Admitting: Physical Medicine and Rehabilitation

## 2023-08-16 DIAGNOSIS — M5126 Other intervertebral disc displacement, lumbar region: Secondary | ICD-10-CM

## 2023-08-16 DIAGNOSIS — M48062 Spinal stenosis, lumbar region with neurogenic claudication: Secondary | ICD-10-CM

## 2023-08-22 ENCOUNTER — Ambulatory Visit
Admission: RE | Admit: 2023-08-22 | Discharge: 2023-08-22 | Disposition: A | Discharge status: Home / Self Care | Source: Ambulatory Visit | Attending: Physical Medicine and Rehabilitation | Admitting: Physical Medicine and Rehabilitation

## 2023-08-22 DIAGNOSIS — M48062 Spinal stenosis, lumbar region with neurogenic claudication: Secondary | ICD-10-CM

## 2023-08-22 DIAGNOSIS — M5126 Other intervertebral disc displacement, lumbar region: Secondary | ICD-10-CM

## 2023-08-23 ENCOUNTER — Ambulatory Visit

## 2023-08-24 ENCOUNTER — Other Ambulatory Visit

## 2023-08-24 ENCOUNTER — Ambulatory Visit (INDEPENDENT_AMBULATORY_CARE_PROVIDER_SITE_OTHER)

## 2023-08-24 DIAGNOSIS — E538 Deficiency of other specified B group vitamins: Secondary | ICD-10-CM

## 2023-08-24 MED ORDER — CYANOCOBALAMIN 1000 MCG/ML IJ SOLN
1000.0000 ug | Freq: Once | INTRAMUSCULAR | Status: AC
  Administered 2023-08-24: 1000 ug via INTRAMUSCULAR

## 2023-08-24 NOTE — Progress Notes (Signed)
 After obtaining consent, and per orders of Bascom Bossier, NP injection of B-12 injection given IM in L Deltoid by Virgina Grills, CMA. Patient tolerated injection well.

## 2023-08-28 ENCOUNTER — Other Ambulatory Visit: Payer: Self-pay | Admitting: Physician Assistant

## 2023-08-30 ENCOUNTER — Other Ambulatory Visit: Payer: Self-pay | Admitting: Family

## 2023-08-30 MED ORDER — MONTELUKAST SODIUM 10 MG PO TABS: 10.0000 mg | ORAL_TABLET | Freq: Every day | ORAL | 1 refills | Status: DC

## 2023-09-04 ENCOUNTER — Telehealth: Payer: Self-pay | Admitting: Internal Medicine

## 2023-09-04 MED ORDER — ONDANSETRON 4 MG PO TBDP: 4.0000 mg | ORAL_TABLET | Freq: Four times a day (QID) | ORAL | 1 refills | Status: AC | PRN

## 2023-09-04 NOTE — Telephone Encounter (Signed)
 Requesting medication refill for zofran . Please advise.

## 2023-09-04 NOTE — Telephone Encounter (Signed)
 Prescription sent to patient's pharmacy.

## 2023-09-05 ENCOUNTER — Other Ambulatory Visit: Payer: Self-pay | Admitting: Family

## 2023-09-05 MED ORDER — FUROSEMIDE 20 MG PO TABS: ORAL_TABLET | ORAL | 0 refills | Status: DC

## 2023-09-05 NOTE — Telephone Encounter (Signed)
 Copied from CRM [id number]. Topic: Clinical - Medication Refill >> Sep 05, 2023 11:56 AM Freya Jesus wrote: Medication: furosemide  (LASIX ) 20 MG tablet [[id number]]  Has the patient contacted their pharmacy? Yes (Agent: If no, request that the patient contact the pharmacy for the refill. If patient does not wish to contact the pharmacy document the reason why and proceed with request.) (Agent: If yes, when and what did the pharmacy advise?) they said they sent us  a request.  This is the patient's preferred pharmacy:  [id number] [place], [place] - [address] [place] [address] Phone: [phone] Fax: [phone]   Is this the correct pharmacy for this prescription? Yes If no, delete pharmacy and type the correct one.   Has the prescription been filled recently? No  Is the patient out of the medication? No, have a few left  Has the patient been seen for an appointment in the last year OR does the patient have an upcoming appointment? Yes  Can we respond through MyChart? Yes  Agent: Please be advised that Rx refills may take up to 3 business days. We ask that you follow-up with your pharmacy.

## 2023-09-18 ENCOUNTER — Ambulatory Visit (INDEPENDENT_AMBULATORY_CARE_PROVIDER_SITE_OTHER)

## 2023-09-18 VITALS — BP 120/76 | HR 70 | Temp 98.2°F | Ht 63.5 in | Wt 102.4 lb

## 2023-09-18 DIAGNOSIS — I8393 Asymptomatic varicose veins of bilateral lower extremities: Secondary | ICD-10-CM | POA: Insufficient documentation

## 2023-09-18 DIAGNOSIS — N951 Menopausal and female climacteric states: Secondary | ICD-10-CM

## 2023-09-18 DIAGNOSIS — Z148 Genetic carrier of other disease: Secondary | ICD-10-CM | POA: Insufficient documentation

## 2023-09-18 DIAGNOSIS — E538 Deficiency of other specified B group vitamins: Secondary | ICD-10-CM | POA: Diagnosis not present

## 2023-09-18 DIAGNOSIS — D509 Iron deficiency anemia, unspecified: Secondary | ICD-10-CM

## 2023-09-18 DIAGNOSIS — Z79890 Hormone replacement therapy (postmenopausal): Secondary | ICD-10-CM | POA: Diagnosis not present

## 2023-09-18 DIAGNOSIS — E8801 Alpha-1-antitrypsin deficiency: Secondary | ICD-10-CM | POA: Insufficient documentation

## 2023-09-18 DIAGNOSIS — G43009 Migraine without aura, not intractable, without status migrainosus: Secondary | ICD-10-CM

## 2023-09-18 DIAGNOSIS — J4530 Mild persistent asthma, uncomplicated: Secondary | ICD-10-CM

## 2023-09-18 DIAGNOSIS — E785 Hyperlipidemia, unspecified: Secondary | ICD-10-CM

## 2023-09-18 DIAGNOSIS — Z8673 Personal history of transient ischemic attack (TIA), and cerebral infarction without residual deficits: Secondary | ICD-10-CM

## 2023-09-18 DIAGNOSIS — R600 Localized edema: Secondary | ICD-10-CM

## 2023-09-18 LAB — COMPREHENSIVE METABOLIC PANEL WITH GFR
ALT: 14 U/L (ref 0–35)
AST: 21 U/L (ref 0–37)
Albumin: 4.3 g/dL (ref 3.5–5.2)
Alkaline Phosphatase: 114 U/L (ref 39–117)
BUN: 16 mg/dL (ref 6–23)
CO2: 34 meq/L — ABNORMAL HIGH (ref 19–32)
Calcium: 9.5 mg/dL (ref 8.4–10.5)
Chloride: 101 meq/L (ref 96–112)
Creatinine, Ser: 1.14 mg/dL (ref 0.40–1.20)
GFR: 47.58 mL/min — ABNORMAL LOW (ref >60.00)
Glucose, Bld: 81 mg/dL (ref 70–99)
Potassium: 3.7 meq/L (ref 3.5–5.1)
Sodium: 139 meq/L (ref 135–145)
Total Bilirubin: 0.5 mg/dL (ref 0.2–1.2)
Total Protein: 7.2 g/dL (ref 6.0–8.3)

## 2023-09-18 LAB — MAGNESIUM: Magnesium: 1.9 mg/dL (ref 1.5–2.5)

## 2023-09-18 MED ORDER — CETIRIZINE HCL 10 MG PO TABS: 10.0000 mg | ORAL_TABLET | Freq: Every day | ORAL | 3 refills | Status: AC

## 2023-09-18 MED ORDER — ESTRADIOL 1 MG PO TABS: 1.0000 mg | ORAL_TABLET | Freq: Every day | ORAL | 0 refills | Status: DC

## 2023-09-18 MED ORDER — MONTELUKAST SODIUM 10 MG PO TABS: 10.0000 mg | ORAL_TABLET | Freq: Every day | ORAL | 1 refills | Status: DC

## 2023-09-18 MED ORDER — FUROSEMIDE 20 MG PO TABS: ORAL_TABLET | ORAL | 3 refills | Status: AC

## 2023-09-18 NOTE — Assessment & Plan Note (Signed)
 Plan per menopausal hormone therapy from today's visit.

## 2023-09-18 NOTE — Assessment & Plan Note (Signed)
 Check CMP, and serum magnesium  level today.

## 2023-09-18 NOTE — Progress Notes (Signed)
 Established Patient Office Visit TOC from Dr. Maribeth (last OV with him was on 01/19/23)   Subjective  Patient ID: Anne [name], female    DOB: Aug 03, 1950  Age: 73 y.o. MRN: [id number]  Chief Complaint  Patient presents with   Establish Care   Transitions Of Care    She  has a past medical history of Achalasia of esophagus, Allergic rhinitis, Allergy, Anemia, Anxiety, Arthritis, Asthma, Bilateral leg edema, Cataract (2014), Chronic pain syndrome, Chronic pulmonary aspiration, Complication of anesthesia, Compressed cervical disc, COPD (chronic obstructive pulmonary disease) (HCC), CVA (cerebral infarction), Dyspnea, Esophageal dysmotility, Gastritis, H/O food anaphylaxis, Heart murmur, History of hiatal hernia, Hypokalemia, IBS (irritable bowel syndrome), Kidney lesion (2018), Liver lesion, Migraine, PICC (peripherally inserted central catheter) removal (02/20/2017), PONV (postoperative nausea and vomiting), Problems with swallowing, Pulmonary fibrosis (HCC), Renal cell carcinoma (HCC), S/P cervical spinal fusion (01/10/2017), Seizures (HCC), Shingles, Stress (03/11/2021), Stroke (HCC), Tracheal granulation, Tracheostomy in place [facility]), and Vocal cord paresis.  HPI 1) Hormone replacement therapy: Patient has been taking Estrace  1 mg daily, has been on years. No vaginal bleeding. She has h/o hysterectomy. Has seen ob/gyn in the past but has not been followed up with them in a while. She has tried going off this in the past. Patient also endorses headache, mostly stress related. She has migraine headache listed to her medical history and has prn migraine medication (Rizatriptan) which she has not taken for at least 6 months. Also reports of having migraine with Aura about 6 years ago. Also has a h/o TIA like episode 25 years ago.   2) Asthma, mild persistent, pulmonary fibrosis, allergic rhinitis: Tracheostomy that was placed over 20 years ago for management of vocal cord paralysis. Albuterol   prn. Also on Dupixent 300 mg twice a month.   On Singular 10 mg daily, Zyrtec  10 mg daily. PRN Trelegy and Duoneb.  She is following up with [facility].  Is planning on getting PFT in August of 2025.   3) B12 deficiency:  On B 12 injection once a month.   4) Low serum magnesium  on 04/16/23 which was 1.6. CMP on 1/20 showed normal Potassium. She took magnesium  supplement when Magnesium  was low but has not been taking it now.   5) Lumbar stenosis:  Established with [facility]. Sees Dr. Morene Dunnings and is on Gabapentin  300 mg at bedtime (was recently started as she is having trouble sleeping from back pain) and Tramadol 50 mg (1/2 to 1 tablet) as needed breakthrough pain. She is waiting to go to PT in July.   6) Peripheral neuropathy:  Dr. Joshua.   7) PRN lower leg edema when she was on Levofloxacin. She is on Lasix  20 mg once a day.   8) IBS, GERD:  She is established with Dr. Albertus and is on Pantoprazole  40 mg twice a day and as needed Zofran .   9) History of TIA about 25 years ago, had 2 episodes of speech change, facial muscle change. She is not on Plavix , statin. She was on Plavix  previously but developed bruising and she stopped taking it. Her last lipid panel from 2 years ago was 80.   10) Stress headache: Behind her eyes, neck tightness. She has Rizatriptan 10 mg prescribed to her for migraine headache, last time she took this was about 6 months ago. She also reports she used to have headaches and has noted aura in the past (aura about 6 years ago).   ROS As per HPI  Objective:     BP 120/76   Pulse 70   Temp 98.2 F (36.8 C) (Oral)   Ht 5' 3.5 (1.613 m)   Wt 102 lb 6.4 oz (46.4 kg)   SpO2 98%   BMI 17.85 kg/m      09/18/2023   10:08 AM 01/09/2023    9:13 AM 11/14/2022    8:45 AM  Depression screen PHQ 2/9  Decreased Interest 0 0 0  Down, Depressed, Hopeless 0 0 0  PHQ - 2 Score 0 0 0  Altered sleeping 1 0 0  Tired, decreased energy 1 0 0   Change in appetite 0 0 0  Feeling bad or failure about yourself  0 0 0  Trouble concentrating 0 0 0  Moving slowly or fidgety/restless 0 0 0  Suicidal thoughts 0 0 0  PHQ-9 Score 2 0 0  Difficult doing work/chores Not difficult at all Not difficult at all Not difficult at all      09/18/2023   10:08 AM 01/09/2023    9:13 AM 11/14/2022    8:45 AM 09/25/2022   10:06 AM  GAD 7 : Generalized Anxiety Score  Nervous, Anxious, on Edge 0 0 0 0  Control/stop worrying 0 0 0 0  Worry too much - different things 0 0 0 0  Trouble relaxing 0 0 0 0  Restless 0 0 0 0  Easily annoyed or irritable 0 0 0 0  Afraid - awful might happen 0 0 0 0  Total GAD 7 Score 0 0 0 0  Anxiety Difficulty Not difficult at all Not difficult at all Not difficult at all Not difficult at all      09/18/2023   10:08 AM 01/09/2023    9:13 AM 11/14/2022    8:45 AM  Depression screen PHQ 2/9  Decreased Interest 0 0 0  Down, Depressed, Hopeless 0 0 0  PHQ - 2 Score 0 0 0  Altered sleeping 1 0 0  Tired, decreased energy 1 0 0  Change in appetite 0 0 0  Feeling bad or failure about yourself  0 0 0  Trouble concentrating 0 0 0  Moving slowly or fidgety/restless 0 0 0  Suicidal thoughts 0 0 0  PHQ-9 Score 2 0 0  Difficult doing work/chores Not difficult at all Not difficult at all Not difficult at all      09/18/2023   10:08 AM 01/09/2023    9:13 AM 11/14/2022    8:45 AM 09/25/2022   10:06 AM  GAD 7 : Generalized Anxiety Score  Nervous, Anxious, on Edge 0 0 0 0  Control/stop worrying 0 0 0 0  Worry too much - different things 0 0 0 0  Trouble relaxing 0 0 0 0  Restless 0 0 0 0  Easily annoyed or irritable 0 0 0 0  Afraid - awful might happen 0 0 0 0  Total GAD 7 Score 0 0 0 0  Anxiety Difficulty Not difficult at all Not difficult at all Not difficult at all Not difficult at all   SDOH Screenings   Food Insecurity: No Food Insecurity (09/17/2023)  Housing: Low Risk  (09/17/2023)  Transportation Needs: No  Transportation Needs (09/17/2023)  Utilities: Not At Risk (05/14/2023)   Received from [facility]  Alcohol  Screen: Low Risk  (07/09/2023)  Depression (PHQ2-9): Low Risk  (09/18/2023)  Financial Resource Strain: Low Risk  (09/17/2023)  Physical Activity: Sufficiently Active (09/17/2023)  Social Connections:  Socially Integrated (09/17/2023)  Stress: No Stress Concern Present (01/08/2023)  Tobacco Use: Low Risk  (09/18/2023)     Physical Exam Constitutional:      Appearance: She is not ill-appearing.  HENT:     Head: Normocephalic and atraumatic.     Right Ear: Tympanic membrane normal.     Left Ear: Tympanic membrane normal.     Ears:     Comments: Wearing hearing aids    Mouth/Throat:     Pharynx: No oropharyngeal exudate.   Eyes:     Extraocular Movements: Extraocular movements intact.     Pupils: Pupils are equal, round, and reactive to light.    Cardiovascular:     Rate and Rhythm: Normal rate.  Pulmonary:     Breath sounds: Wheezing (bilateral wheezing noted) present.  Abdominal:     Tenderness: There is no abdominal tenderness. There is no guarding.   Musculoskeletal:     Right lower leg: No edema.     Left lower leg: No edema.     Comments: Bilateral lower leg varicose veins    Skin:    General: Skin is warm.   Neurological:     Mental Status: She is alert and oriented to person, place, and time.   Psychiatric:        Mood and Affect: Mood normal.        No results found for any visits on 09/18/23.  The ASCVD Risk score (Arnett DK, et al., 2019) failed to calculate for the following reasons:   Risk score cannot be calculated because patient has a medical history suggesting prior/existing ASCVD     Assessment & Plan:   Current long-term use of postmenopausal hormone replacement therapy Assessment & Plan: Chronic issue.   On Estrace  1 mg and has tried to discontinue this in the past.  Given h/o migraine like headache, TIA episode in  the past I am concerned about risks related to continuing on this medications.  Reinforced the potential long-term risks of this medication including stroke, heart attack, cancer, and blood clot.  I recommend referral to OB/Gyn to help with their expertise in determining safety, duration of Estrace . Referral made. She will continue to take Estrace  1 mg till she is seen by them.   Orders: -     Ambulatory referral to Obstetrics / Gynecology  Menopausal symptom Assessment & Plan: Plan per menopausal hormone therapy from today's visit.    Orders: -     Estradiol ; Take 1 tablet (1 mg total) by mouth daily.  Dispense: 90 tablet; Refill: 0 -     Ambulatory referral to Obstetrics / Gynecology  Hypomagnesemia Assessment & Plan: Check CMP, and serum magnesium  level today.   Orders: -     Comprehensive metabolic panel with GFR -     Magnesium   B12 deficiency Assessment & Plan: Chronic issue. Continue B 12 monthly IM injection.    History of TIA (transient ischemic attack) Assessment & Plan: Plavix  was d/c by patient and her previous PCP due to side effects.  Not on statin either. Discussed risks and benefits of starting statin now. We will reevaluate and do fasting lipid panel during her next visit. Patient is not fasting today.    Asymptomatic varicose veins of both lower extremities Assessment & Plan: Recommend compression stockings.    Migraine without aura and without status migrainosus, not intractable Assessment & Plan: Chronic issue. Not needing Maxalt 10 mg. Symptoms stable with Ibuprofen , Tylenol  when headache occurs. Given use  of Estrace  OB/gyn referral made to determine appropriateness of therapy.    Mild persistent asthma without complication Assessment & Plan: Chronic issue. Continue follow up with [facility], management per them.   Refill on Singular 10 mg, Zyrtec  10 mg daily done.   Orders: -     Cetirizine  HCl; Take 1 tablet (10 mg total) by mouth  daily.  Dispense: 90 tablet; Refill: 3 -     Montelukast  Sodium; Take 1 tablet (10 mg total) by mouth at bedtime.  Dispense: 90 tablet; Refill: 1  Lower leg edema -     Furosemide ; TAKE 1 TABLET BY MOUTH EVERY DAY AS NEEDED  Dispense: 90 tablet; Refill: 3  I spent 45 minutes on the day of this face-to-face encounter reviewing the patient's medical and surgical history, medications, ongoing concerns, and reviewing the assessment and plan with the patient. This time also included counseling the patient on their health conditions and management options. Additionally, I spent time post-visit ordering and reviewing diagnostics and therapeutics with the patient.  Return in about 6 months (around 03/19/2024) for chronic follow up .   Luke Shade, MD

## 2023-09-18 NOTE — Assessment & Plan Note (Signed)
 Recommend compression stockings

## 2023-09-18 NOTE — Assessment & Plan Note (Signed)
 Chronic issue.   On Estrace  1 mg and has tried to discontinue this in the past.  Given h/o migraine like headache, TIA episode in the past I am concerned about risks related to continuing on this medications.  Reinforced the potential long-term risks of this medication including stroke, heart attack, cancer, and blood clot.  I recommend referral to OB/Gyn to help with their expertise in determining safety, duration of Estrace . Referral made. She will continue to take Estrace  1 mg till she is seen by them.

## 2023-09-18 NOTE — Assessment & Plan Note (Signed)
 Chronic issue. Not needing Maxalt 10 mg. Symptoms stable with Ibuprofen , Tylenol  when headache occurs. Given use of Estrace  OB/gyn referral made to determine appropriateness of therapy.

## 2023-09-18 NOTE — Assessment & Plan Note (Signed)
 Plavix  was d/c by patient and her previous PCP due to side effects.  Not on statin either. Discussed risks and benefits of starting statin now. We will reevaluate and do fasting lipid panel during her next visit. Patient is not fasting today.

## 2023-09-18 NOTE — Assessment & Plan Note (Signed)
 Chronic issue. Continue follow up with [facility], management per them.   Refill on Singular 10 mg, Zyrtec  10 mg daily done.

## 2023-09-18 NOTE — Assessment & Plan Note (Signed)
 Chronic issue. Continue B 12 monthly IM injection.

## 2023-09-19 ENCOUNTER — Ambulatory Visit: Payer: Self-pay

## 2023-09-20 ENCOUNTER — Telehealth: Payer: Self-pay | Admitting: Internal Medicine

## 2023-09-20 NOTE — Telephone Encounter (Signed)
 Pt states she has been having an issue with slow motility again and thinks she may need an antibiotic. Pt scheduled to see Deanna May NP 09/27/23@1 :30pm. Pt aware of appt.

## 2023-09-20 NOTE — Telephone Encounter (Signed)
 Patient called and stated that she is having slow motility and she has not really had any change with taking her Protonix  and Zofran . Patient stated that she was wondering if she can have an antibiotic. Patient is requesting a call back after 9 am. Please advise.

## 2023-09-26 ENCOUNTER — Ambulatory Visit

## 2023-09-26 ENCOUNTER — Encounter: Payer: Self-pay | Admitting: Gastroenterology

## 2023-09-26 ENCOUNTER — Ambulatory Visit: Admitting: Gastroenterology

## 2023-09-26 VITALS — BP 110/62 | HR 73 | Ht 63.0 in | Wt 102.0 lb

## 2023-09-26 DIAGNOSIS — K5901 Slow transit constipation: Secondary | ICD-10-CM

## 2023-09-26 DIAGNOSIS — K638219 Small intestinal bacterial overgrowth, unspecified: Secondary | ICD-10-CM | POA: Diagnosis not present

## 2023-09-26 DIAGNOSIS — E538 Deficiency of other specified B group vitamins: Secondary | ICD-10-CM | POA: Diagnosis not present

## 2023-09-26 DIAGNOSIS — R140 Abdominal distension (gaseous): Secondary | ICD-10-CM

## 2023-09-26 DIAGNOSIS — K224 Dyskinesia of esophagus: Secondary | ICD-10-CM

## 2023-09-26 DIAGNOSIS — R143 Flatulence: Secondary | ICD-10-CM | POA: Diagnosis not present

## 2023-09-26 MED ORDER — CYANOCOBALAMIN 1000 MCG/ML IJ SOLN
1000.0000 ug | Freq: Once | INTRAMUSCULAR | Status: AC
  Administered 2023-09-26: 1000 ug via INTRAMUSCULAR

## 2023-09-26 MED ORDER — AMOXICILLIN-POT CLAVULANATE 500-125 MG PO TABS: 1.0000 | ORAL_TABLET | Freq: Three times a day (TID) | ORAL | 0 refills | Status: AC

## 2023-09-26 NOTE — Progress Notes (Signed)
 Chief Complaint: slowed motility, requesting antibiotics Primary GI Doctor:Dr. Albertus  HPI: Anne [name] is a 73 year old Caucasian female with a past medical history as listed below including achalasia of the esophagus, CVA, COPD, IBS and multiple others, known to Dr. Albertus, who presents to clinic today with complaint of nausea, vomiting epigastric pain.  Last seen in GI office by Delon, PA on 03/31/22. EGD with botox  ordered for reoccurring symptoms.  06/16/2021 EGD at the hospital with Botox  injections for hypertensive LES with favorable response to Botox  injection in the past last performed Kwamane Whack 2022. At that time esophagus was normal and the LES was injected with Botox  in 4 quadrants.   04/06/22 EGD with botox  for hypertensive LES. Normal esophagus.  Lower esophageal sphincter injected with botulinum toxin ( 100 Units total) .  Erythematous mucosa in the antrum. Biopsied to exclude H. Pylori infection.  Normal examined duodenum.  Interval History  Her main concern today is of bloating, gas, and constipation. She reports she has history of Sibo and in the past was given Xifaxan  samples as well as Augmentin  when no samples available which both provided her relief. She has history of constipation, but not currently not on any medication. She will go up to 7 days without bowel movement. She has tried and failed OTC Miralax, Metamcuil, and Linzess . It will cause severe bloating or severe diarrhea.   She also has history of hypertensive LES. Patient does have intermittent burning in her esophagus. She follows a very strict bland diet and over the years has cut out a lot of foods including meats, husks, peels, etc. She has had frequent nausea over the course of the last 2 weeks. She takes prn zofran  and it works well.  Patient is taking Pantoprazole  40mg  po twice daily due to c/o GERD symptoms via telephone call to office. She states this helped initially then the symptoms return.   She had EGD with  Botox  last January 2024 and reports she felt well but symptoms have slowly increased over the past month or so. She does not feel it is severe enough to have another EGD with botox .     Per note in 2017 Dr. Albertus  No other great options for SIBO, given her multiple allergies She has tried Augmentin  and Flagyl  I would rather not have her on continuous abx, which only eventually leads to resistance and further disruption of the gut microbiome Did the cramping and bloating start with the Linzess ?  This med can have these side effects Is she using Levsin  for the cramping and if so, does it help? Would d/c ABX for now ROV next available, keep me up to date with symptoms   Wt Readings from Last 3 Encounters:  09/26/23 102 lb (46.3 kg)  09/18/23 102 lb 6.4 oz (46.4 kg)  04/11/23 103 lb (46.7 kg)      Past Medical History:  Diagnosis Date   Achalasia of esophagus    Allergic rhinitis    Allergy    multiple, mostly aspirin, levaquin and shellfish.   Anemia    Anxiety    Arthritis    Asthma    Bilateral leg edema    Cataract 2014   exraction with len implant   Chronic pain syndrome    Chronic pulmonary aspiration    Complication of anesthesia    Breathing problems upon waking up. 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   Compressed cervical disc    COPD (chronic obstructive pulmonary  disease) (HCC)    CVA (cerebral infarction)    Dyspnea    Esophageal dysmotility    Gastritis    H/O food anaphylaxis    Heart murmur    as child   History of hiatal hernia    Hypokalemia    IBS (irritable bowel syndrome)    Kidney lesion 2018   Left   Liver lesion    Migraine    PICC (peripherally inserted central catheter) removal 02/20/2017   PONV (postoperative nausea and vomiting)    Problems with swallowing    intermittently   Pulmonary fibrosis (HCC)    Renal cell carcinoma (HCC)    small- left kidney   S/P cervical spinal fusion 01/10/2017   Seizures  (HCC)    only in elementary school-was on medication and was tx up until 5th grade and has had no more seizures since then   Shingles    Stress 03/11/2021   Stroke (HCC)    slurred speech, drawn face, imaging normal, occurred twice, [facility]-TIA if antything 02/20/17-no residual effects   Tracheal granulation    Tracheostomy in place [facility])    Vocal cord paresis     Past Surgical History:  Procedure Laterality Date   ABDOMINAL HYSTERECTOMY     APPLICATION OF A-CELL OF HEAD/NECK N/A 02/21/2017   Procedure: APPLICATION OF A-CELL OF HEAD/NECK;  Surgeon: Lowery Estefana RAMAN, DO;  Location: [facility] OR;  Service: Plastics;  Laterality: N/A;   BACK SURGERY     BIOPSY  04/06/2022   Procedure: BIOPSY;  Surgeon: Albertus Gordy HERO, MD;  Location: [facility] ENDOSCOPY;  Service: Gastroenterology;;   BOTOX  INJECTION N/A 07/29/2013   Procedure: BOTOX  INJECTION;  Surgeon: Gordy HERO Albertus, MD;  Location: [facility] ENDOSCOPY;  Service: Gastroenterology;  Laterality: N/A;   BOTOX  INJECTION N/A 05/04/2015   Procedure: BOTOX  INJECTION;  Surgeon: Gordy HERO Albertus, MD;  Location: [facility] ENDOSCOPY;  Service: Gastroenterology;  Laterality: N/A;   BOTOX  INJECTION N/A 02/18/2018   Procedure: BOTOX  INJECTION;  Surgeon: Albertus Gordy HERO, MD;  Location: [facility] ENDOSCOPY;  Service: Gastroenterology;  Laterality: N/A;   BOTOX  INJECTION N/A 06/30/2019   Procedure: BOTOX  INJECTION;  Surgeon: Albertus Gordy HERO, MD;  Location: [facility] ENDOSCOPY;  Service: Gastroenterology;  Laterality: N/A;   BOTOX  INJECTION N/A 08/05/2020   Procedure: BOTOX  INJECTION;  Surgeon: Albertus Gordy HERO, MD;  Location: [facility] ENDOSCOPY;  Service: Gastroenterology;  Laterality: N/A;   BOTOX  INJECTION N/A 06/16/2021   Procedure: BOTOX  INJECTION;  Surgeon: Albertus Gordy HERO, MD;  Location: [facility] ENDOSCOPY;  Service: Gastroenterology;  Laterality: N/A;   BOTOX  INJECTION N/A 04/06/2022   Procedure: BOTOX  INJECTION;  Surgeon: Albertus Gordy HERO, MD;  Location: [facility] ENDOSCOPY;  Service: Gastroenterology;  Laterality: N/A;    BREAST BIOPSY Left    neg   BREAST SURGERY Left 2002   bx of skin   CATARACT EXTRACTION W/PHACO Right 04/11/2023   Procedure: CATARACT EXTRACTION PHACO AND INTRAOCULAR LENS PLACEMENT (IOC) RIGHT 8.77, 00:36.3;  Surgeon: Mittie Gaskin, MD;  Location: [facility];  Service: Ophthalmology;  Laterality: Right;   CHOLECYSTECTOMY     COLONOSCOPY     DIAGNOSTIC LAPAROSCOPY     ESOPHAGEAL MANOMETRY N/A 12/16/2012   Procedure: ESOPHAGEAL MANOMETRY (EM);  Surgeon: Gordy HERO Albertus, MD;  Location: [facility];  Service: Gastroenterology;  Laterality: N/A;   ESOPHAGOGASTRODUODENOSCOPY (EGD) WITH PROPOFOL  N/A 07/29/2013   Procedure: ESOPHAGOGASTRODUODENOSCOPY (EGD) WITH PROPOFOL ;  Surgeon: Gordy HERO Albertus, MD;  Location: [facility];  Service: Gastroenterology;  Laterality: N/A;  with  botox  injection   ESOPHAGOGASTRODUODENOSCOPY (EGD) WITH PROPOFOL  N/A 05/04/2015   Procedure: ESOPHAGOGASTRODUODENOSCOPY (EGD) WITH PROPOFOL ;  Surgeon: Gordy CHRISTELLA Starch, MD;  Location: [facility];  Service: Gastroenterology;  Laterality: N/A;   ESOPHAGOGASTRODUODENOSCOPY (EGD) WITH PROPOFOL  N/A 02/18/2018   Procedure: ESOPHAGOGASTRODUODENOSCOPY (EGD) WITH PROPOFOL ;  Surgeon: Starch Gordy CHRISTELLA, MD;  Location: [facility];  Service: Gastroenterology;  Laterality: N/A;   ESOPHAGOGASTRODUODENOSCOPY (EGD) WITH PROPOFOL  N/A 06/30/2019   Procedure: ESOPHAGOGASTRODUODENOSCOPY (EGD) WITH PROPOFOL ;  Surgeon: Starch Gordy CHRISTELLA, MD;  Location: [facility];  Service: Gastroenterology;  Laterality: N/A;   ESOPHAGOGASTRODUODENOSCOPY (EGD) WITH PROPOFOL  N/A 08/05/2020   Procedure: ESOPHAGOGASTRODUODENOSCOPY (EGD) WITH PROPOFOL ;  Surgeon: Starch Gordy CHRISTELLA, MD;  Location: [facility];  Service: Gastroenterology;  Laterality: N/A;   ESOPHAGOGASTRODUODENOSCOPY (EGD) WITH PROPOFOL  N/A 06/16/2021   Procedure: ESOPHAGOGASTRODUODENOSCOPY (EGD) WITH PROPOFOL ;  Surgeon: Starch Gordy CHRISTELLA, MD;  Location: [facility];  Service: Gastroenterology;   Laterality: N/A;   ESOPHAGOGASTRODUODENOSCOPY (EGD) WITH PROPOFOL  N/A 04/06/2022   Procedure: ESOPHAGOGASTRODUODENOSCOPY (EGD) WITH PROPOFOL ;  Surgeon: Starch Gordy CHRISTELLA, MD;  Location: [facility] ENDOSCOPY;  Service: Gastroenterology;  Laterality: N/A;   EYE SURGERY     Catarct surgery 2014   Eye Surgery AS Child Left    INCISION AND DRAINAGE OF WOUND N/A 02/21/2017   Procedure: IRRIGATION AND DEBRIDEMENT WOUND NECK;  Surgeon: Lowery Estefana RAMAN, DO;  Location: [facility] OR;  Service: Plastics;  Laterality: N/A;   JEJUNOSTOMY FEEDING TUBE     x2 both failed. no longer has   LUMBAR LAMINECTOMY/DECOMPRESSION MICRODISCECTOMY Bilateral 05/13/2018   Procedure: Laminectomy and Foraminotomy - Lumbar four-Lumbar five- bilateral;  Surgeon: Joshua Alm RAMAN, MD;  Location: [facility] OR;  Service: Neurosurgery;  Laterality: Bilateral;   LUMBAR LAMINECTOMY/DECOMPRESSION MICRODISCECTOMY Bilateral 11/08/2020   Procedure: Laminectomy and Foraminotomy - bilateral - Lumbar two-Lumbar three - Lumbar three-Lumbar four;  Surgeon: Joshua Alm RAMAN, MD;  Location: [facility] OR;  Service: Neurosurgery;  Laterality: Bilateral;   MULTIPLE TOOTH EXTRACTIONS     2 teeth removed   OOPHORECTOMY     POSTERIOR CERVICAL FUSION/FORAMINOTOMY N/A 01/10/2017   Procedure: LAMINECTOMY AND FORAMINOTOMY CERVICAL FOUR-CERVICAL FIVE, CERVICAL FIVE-SIX POSTERIOR CERVICAL INSTRUMENT FUSION CERVICAL THREE-CERVICAL SEVEN,CERVICAL LAMINECTOMY CERVICAL THREE-CERVICAL SEVEN.;  Surgeon: Joshua Alm RAMAN, MD;  Location: [facility] OR;  Service: Neurosurgery;  Laterality: N/A;  posterior   TONSILLECTOMY     TRACHEOSTOMY  1996   done at [facility], Dr. Edda   TRACHEOSTOMY REVISION N/A 09/14/2021   Procedure: TRACHEOSTOMA REVISION, TRACHEOPLASTY;  Surgeon: Edda Mt, MD;  Location: [facility] ORS;  Service: ENT;  Laterality: N/A;   TUBAL LIGATION     VIDEO BRONCHOSCOPY Bilateral 11/20/2012   Procedure: VIDEO BRONCHOSCOPY WITH FLUORO;  Surgeon: Vicenta KATHEE Lennert, MD;  Location: [facility] ENDOSCOPY;   Service: Cardiopulmonary;  Laterality: Bilateral;    Current Outpatient Medications  Medication Sig Dispense Refill   acetaminophen  (TYLENOL ) 500 MG tablet Take 1,000 mg by mouth 2 (two) times daily as needed for moderate pain or headache.     albuterol  (VENTOLIN  HFA) 108 (90 Base) MCG/ACT inhaler Inhale 2 puffs into the lungs every 6 (six) hours as needed for wheezing or shortness of breath. 18 g 3   amoxicillin -clavulanate (AUGMENTIN ) 500-125 MG tablet Take 1 tablet by mouth 3 (three) times daily for 7 days. 21 tablet 0   azelastine  (ASTELIN ) 0.1 % nasal spray Place 1 spray into both nostrils 2 (two) times daily. Use in each nostril as directed 30 mL 6   benzonatate  (TESSALON ) 200 MG capsule Take 200 mg by  mouth 2 (two) times daily as needed.     cetirizine  (ZYRTEC ) 10 MG tablet Take 1 tablet (10 mg total) by mouth daily. 90 tablet 3   cyanocobalamin  (VITAMIN B12) 1000 MCG/ML injection Inject 1,000 mcg into the muscle every 30 (thirty) days.     diphenhydrAMINE  (BENADRYL ) 25 MG tablet Take 50 mg by mouth daily as needed for allergies (Asthma).     dupilumab (DUPIXENT) 300 MG/2ML prefilled syringe Inject 300 mg into the skin once.     EPINEPHrine  0.3 mg/0.3 mL IJ SOAJ injection Inject 0.3 mg into the muscle as needed for anaphylaxis.      estradiol  (ESTRACE ) 1 MG tablet Take 1 tablet (1 mg total) by mouth daily. 90 tablet 0   fluticasone  (FLONASE ) 50 MCG/ACT nasal spray Place 1 spray into both nostrils daily.     Fluticasone -Umeclidin-Vilant (TRELEGY ELLIPTA ) 100-62.5-25 MCG/ACT AEPB Inhale 1 puff into the lungs daily. 60 each 0   furosemide  (LASIX ) 20 MG tablet TAKE 1 TABLET BY MOUTH EVERY DAY AS NEEDED 90 tablet 3   gabapentin  (NEURONTIN ) 300 MG capsule Take 300 mg by mouth at bedtime.     ibuprofen  (ADVIL ,MOTRIN ) 200 MG tablet Take 400-800 mg by mouth daily as needed for headache or moderate pain.     ipratropium-albuterol  (DUONEB) 0.5-2.5 (3) MG/3ML SOLN Take 3 mLs by nebulization every 4  (four) hours as needed. Dx 496 120 mL 2   montelukast  (SINGULAIR ) 10 MG tablet Take 1 tablet (10 mg total) by mouth at bedtime. 90 tablet 1   ondansetron  (ZOFRAN -ODT) 4 MG disintegrating tablet Take 1 tablet (4 mg total) by mouth every 6 (six) hours as needed for nausea or vomiting. 30 tablet 1   pantoprazole  (PROTONIX ) 40 MG tablet TAKE 1 TABLET BY MOUTH TWICE A DAY 180 tablet 1   Pseudoephedrine HCl (SUDAFED PO) Take 1 tablet by mouth daily as needed (cold symptons).     rizatriptan (MAXALT-MLT) 10 MG disintegrating tablet TAKE 1 TABLET (10 MG TOTAL) BY MOUTH DAILY AS NEEDED FOR MIGRAINE. Kimmy Totten REPEAT FOR ONE DOSE 2 HOURS AFTER THE FIRST ONE IF NEEDED. 10 tablet 0   sodium chloride  (OCEAN) 0.65 % SOLN nasal spray Place 1 spray into both nostrils every 4 (four) hours as needed for congestion.      traMADol (ULTRAM) 50 MG tablet Take by mouth.     White Petrolatum-Mineral Oil (LUBRICANT EYE) OINT Place 1 application into both eyes 2 (two) times daily as needed (for dry eyes).      No current facility-administered medications for this visit.    Allergies as of 09/26/2023 - Review Complete 09/26/2023  Allergen Reaction Noted   Asa [aspirin] Shortness Of Breath, Swelling, and Other (See Comments) 08/05/2012   Bee venom Anaphylaxis and Shortness Of Breath 02/08/2017   Doxycycline  Hives 09/25/2022   Epinephrine  Shortness Of Breath and Palpitations 10/23/2013   Feraheme  [ferumoxytol ] Shortness Of Breath 10/31/2017   Influenza vaccines Shortness Of Breath and Other (See Comments) 01/14/2013   Pfizer-biontech covid-19 vacc [covid-19 mrna vacc (moderna)] Anaphylaxis 08/19/2019   Quinolones Swelling and Other (See Comments) 07/01/2013   Shellfish allergy Anaphylaxis 08/13/2012   Celebrex [celecoxib] Nausea And Vomiting 08/22/2022   Ciprofloxacin  Swelling and Other (See Comments) 07/01/2013   Clarithromycin  Nausea Only 10/23/2013   Erythromycin Nausea Only 10/23/2013   Levaquin [levofloxacin in d5w]  Swelling and Other (See Comments) 08/05/2012   Peanut-containing drug products Other (See Comments) 01/04/2017   Pineapple Swelling 06/16/2022   Septra  [sulfamethoxazole -trimethoprim ] Diarrhea 04/18/2013  Telithromycin Nausea Only 10/23/2013   Zanaflex [tizanidine] Other (See Comments) 02/08/2017   Adhesive [tape] Rash and Other (See Comments) 08/03/2016    Family History  Problem Relation Age of Onset   Asthma Cousin    COPD Cousin    Breast cancer Maternal Grandmother 40   Asthma Father    Kidney cancer Father    Arrhythmia Father    Lung cancer Father    Lung cancer Paternal Uncle    COPD Paternal Grandfather    Alcohol  abuse Mother    Breast cancer Maternal Aunt 54   Bladder Cancer Neg Hx    Colon cancer Neg Hx    Esophageal cancer Neg Hx    Pancreatic cancer Neg Hx    Stomach cancer Neg Hx    Liver disease Neg Hx     Review of Systems:    Constitutional: No weight loss, fever, chills, weakness or fatigue HEENT: Eyes: No change in vision               Ears, Nose, Throat:  No change in hearing or congestion Skin: No rash or itching Cardiovascular: No chest pain, chest pressure or palpitations   Respiratory: No SOB or cough Gastrointestinal: See HPI and otherwise negative Genitourinary: No dysuria or change in urinary frequency Neurological: No headache, dizziness or syncope Musculoskeletal: No new muscle or joint pain Hematologic: No bleeding or bruising Psychiatric: No history of depression or anxiety    Physical Exam:  Vital signs: BP 110/62 (BP Location: Left Arm, Patient Position: Sitting, Cuff Size: Normal)   Pulse 73   Ht 5' 3 (1.6 m)   Wt 102 lb (46.3 kg)   BMI 18.07 kg/m   Constitutional:   Pleasant  female appears to be in NAD, Well developed, Well nourished, alert and cooperative Throat: Oral cavity and pharynx without inflammation, swelling or lesion.  Respiratory: Respirations even and unlabored. Lungs clear to auscultation bilaterally.   No  wheezes, crackles, or rhonchi.  Cardiovascular: Normal S1, S2. Regular rate and rhythm. No peripheral edema, cyanosis or pallor.  Gastrointestinal:  Soft, nondistended, nontender. No rebound or guarding. Normal bowel sounds. No appreciable masses or hepatomegaly. Rectal:  Not performed.  Msk:  Symmetrical without gross deformities. Without edema, no deformity or joint abnormality.  Neurologic:  Alert and  oriented x4;  grossly normal neurologically.  Skin:   Dry and intact without significant lesions or rashes. Psychiatric: Oriented to person, place and time. Demonstrates good judgement and reason without abnormal affect or behaviors.  RELEVANT LABS AND IMAGING: CBC    Latest Ref Rng & Units 09/06/2021    8:56 AM 10/27/2020   11:39 AM 08/17/2020    9:09 AM  CBC  WBC 4.0 - 10.5 K/uL 4.6  5.8  4.7   Hemoglobin 12.0 - 15.0 g/dL 87.6  85.5  86.8   Hematocrit 36.0 - 46.0 % 36.8  42.3  39.0   Platelets 150.0 - 400.0 K/uL 175.0  196  203.0      CMP     Latest Ref Rng & Units 09/18/2023   10:52 AM 01/23/2023    3:36 PM 09/06/2021    8:56 AM  CMP  Glucose 70 - 99 mg/dL 81  97  76   BUN 6 - 23 mg/dL 16  15  15    Creatinine 0.40 - 1.20 mg/dL 8.85  8.80  8.97   Sodium 135 - 145 mEq/L 139  141  141   Potassium 3.5 - 5.1 mEq/L 3.7  3.5  3.8   Chloride 96 - 112 mEq/L 101  104  105   CO2 19 - 32 mEq/L 34  28  30   Calcium  8.4 - 10.5 mg/dL 9.5  9.1  9.5   Total Protein 6.0 - 8.3 g/dL 7.2   6.3   Total Bilirubin 0.2 - 1.2 mg/dL 0.5   0.5   Alkaline Phos 39 - 117 U/L 114   99   AST 0 - 37 U/L 21   16   ALT 0 - 35 U/L 14   10      Lab Results  Component Value Date   TSH 0.44 08/03/2017     Assessment: Encounter Diagnoses  Name Primary?   Small intestinal bacterial overgrowth (SIBO) Yes   Flatulence    Bloating    Hypertensive lower esophageal sphincter    Slow transit constipation      73 year old female patient who presents with main complaint of gas, bloating, and constipation.   Patient has history of slow transit constipation and has not responded well to high-fiber diet, over-the-counter laxatives and/or pro secretory agents like Linzess .  She does have a history of SIBO and has been treated in the past with Augmentin  due to history of antibiotic allergies and reports she responded well.  She responded even better to Xifaxan  but unfortunately it is not covered by her insurance.  We spent several minutes discussing dietary modifications and dysbiosis when her bowels are not moving regularly.  She is not interested in revisiting laxatives or constipation medications at this time.    Patient also has history of hypertensive lower esophageal sphincter but does not feel her symptoms are severe enough to warrant another endoscopy and will contact the office if she does.  She is currently on PPI therapy twice daily and uses antiemetics as needed.  Her diet is very restrictive.  Weight stable  Plan: -Will send Amoxicillin /clavulanate 500/125 mg tid. For 7 days for Sibo -no samples of Xifaxan  available, not covered by insurance and patient has allergies to doxycycline . - recommend low fodmap diet -Recommend Otc Atrantil as alternative  - we had discussion about avoiding long term abx use, pt verbalizes understanding -Continue pantoprazole  40 mg twice daily -Antiemetics as needed -patient will contact office if she needs repeat EGD with botox .  Thank you for the courtesy of this consult. Please call me with any questions or concerns.   Atley Scarboro, FNP-C [facility] 09/26/2023, 4:54 PM  Cc: Bair, Kalpana, MD

## 2023-09-26 NOTE — Patient Instructions (Addendum)
 Small intestinal bacterial overgrowth  Recommend low fodmap diet Can try Over the counter Atrantil- easier to purchase online or [employer]- these may be another option. Will discuss with Dr. Albertus about Augmentin  regimen, once I hear back will contact you.  _______________________________________________________  If your blood pressure at your visit was 140/90 or greater, please contact your primary care physician to follow up on this.  _______________________________________________________  If you are age 51 or older, your body mass index should be between 23-30. Your Body mass index is 18.07 kg/m. If this is out of the aforementioned range listed, please consider follow up with your Primary Care Provider.  If you are age 64 or younger, your body mass index should be between 19-25. Your Body mass index is 18.07 kg/m. If this is out of the aformentioned range listed, please consider follow up with your Primary Care Provider.   ________________________________________________________  The [facility] providers would like to encourage you to use MYCHART to communicate with providers for non-urgent requests or questions.  Due to long hold times on the telephone, sending your provider a message by [facility] may be a faster and more efficient way to get a response.  Please allow 48 business hours for a response.  Please remember that this is for non-urgent requests.  _______________________________________________________  Thank you for trusting me with your gastrointestinal care. Deanna May, RNP

## 2023-09-26 NOTE — Progress Notes (Signed)
Pt received B12 injection in Left deltoid. Pt tolerated it well with no complaints or concerns.

## 2023-09-27 ENCOUNTER — Ambulatory Visit: Admitting: Gastroenterology

## 2023-10-08 ENCOUNTER — Telehealth: Payer: Self-pay

## 2023-10-08 NOTE — Telephone Encounter (Signed)
 Noted. Patient has been notified via MyChart.

## 2023-10-08 NOTE — Telephone Encounter (Signed)
 Copied from CRM [id number]. Topic: Referral - Request for Referral >> Oct 08, 2023  9:30 AM Emylou G wrote: Did the patient discuss referral with their provider in the last year? Yes (If No - schedule appointment) (If Yes - send message)  Appointment offered? No  Type of order/referral and detailed reason for visit: Estrogen check  Preference of office, provider, location:  Dr Lorren Mace: [phone] [place]  If referral order, have you been seen by this specialty before? No (If Yes, this issue or another issue? When? Where?  Can we respond through MyChart? Yes >> Oct 08, 2023  9:33 AM Emylou G wrote: She canceled the other and rather go to Dr Mace

## 2023-10-15 ENCOUNTER — Telehealth: Payer: Self-pay | Admitting: Gastroenterology

## 2023-10-15 NOTE — Telephone Encounter (Signed)
 Inbound cal from patient stating she would like to move forward in discussing EGD with botox . Please advise.

## 2023-10-18 NOTE — Telephone Encounter (Signed)
Inbound call from patient in regards to previous note. Please advise.  Thank you

## 2023-10-18 NOTE — Telephone Encounter (Signed)
 Returned call to patient & she would like to schedule an EGD with botox . Last completed 04/06/22 with Dr. Albertus. She states this was discussed at OV on 09/26/23 with Deanna, and feels now is the best time since she is reaching her limit and food is starting to push back. Still able to tolerate swallowing food/liquids, just noticing more push back at bottom of her esophagus. She completed amoxicillin  prescribed at last visit & takes pantoprazole  40 mg BID. Advised her I would have Deanna review & then will be in touch.

## 2023-10-19 ENCOUNTER — Other Ambulatory Visit: Payer: Self-pay

## 2023-10-19 DIAGNOSIS — K224 Dyskinesia of esophagus: Secondary | ICD-10-CM

## 2023-10-19 NOTE — Telephone Encounter (Signed)
 Called and spoke patient and we scheduled hospital procedure for 01/14/24 and instructions will be updated in mychart. Patient verbalized understanding.

## 2023-10-29 ENCOUNTER — Ambulatory Visit (INDEPENDENT_AMBULATORY_CARE_PROVIDER_SITE_OTHER)

## 2023-10-29 ENCOUNTER — Encounter: Admitting: Obstetrics & Gynecology

## 2023-10-29 DIAGNOSIS — E538 Deficiency of other specified B group vitamins: Secondary | ICD-10-CM

## 2023-10-29 MED ORDER — CYANOCOBALAMIN 1000 MCG/ML IJ SOLN
1000.0000 ug | Freq: Once | INTRAMUSCULAR | Status: AC
  Administered 2023-10-29: 1000 ug via INTRAMUSCULAR

## 2023-10-29 NOTE — Progress Notes (Signed)
 Pt presented for their vitamin B12 injection. Pt was identified through two identifiers. Pt tolerated shot well in their right deltoid.

## 2023-11-04 NOTE — Progress Notes (Signed)
 Addendum: Reviewed and agree with assessment and management plan. Asha Grumbine, Carie Caddy, MD

## 2023-11-21 ENCOUNTER — Ambulatory Visit (INDEPENDENT_AMBULATORY_CARE_PROVIDER_SITE_OTHER): Admitting: *Deleted

## 2023-11-21 VITALS — Ht 63.5 in | Wt 104.0 lb

## 2023-11-21 DIAGNOSIS — Z0000 Encounter for general adult medical examination without abnormal findings: Secondary | ICD-10-CM

## 2023-11-21 NOTE — Progress Notes (Signed)
 .  Subjective:   Anne [name] is a 73 y.o. who presents for a Medicare Wellness preventive visit.  As a reminder, Annual Wellness Visits don't include a physical exam, and some assessments may be limited, especially if this visit is performed virtually. We may recommend an in-person follow-up visit with your provider if needed.  Visit Complete: Virtual I connected with  Kassaundra G Mclennan on 11/21/23 by a audio enabled telemedicine application and verified that I am speaking with the correct person using two identifiers.  Patient Location: Home  Provider Location: Home Office  I discussed the limitations of evaluation and management by telemedicine. The patient expressed understanding and agreed to proceed.  Vital Signs: Because this visit was a virtual/telehealth visit, some criteria may be missing or patient reported. Any vitals not documented were not able to be obtained and vitals that have been documented are patient reported.  VideoDeclined- This patient declined Librarian, academic. Therefore the visit was completed with audio only.  Persons Participating in Visit: Patient.  AWV Questionnaire: No: Patient Medicare AWV questionnaire was not completed prior to this visit.  Cardiac Risk Factors include: advanced age (>47men, >89 women);dyslipidemia     Objective:    Today's Vitals   11/21/23 1050 11/21/23 1051  Weight: 104 lb (47.2 kg)   Height: 5' 3.5 (1.613 m)   PainSc:  8    Body mass index is 18.13 kg/m.     11/21/2023   11:06 AM 04/11/2023    8:25 AM 04/06/2022    9:16 AM 02/02/2022   10:39 AM 09/14/2021    6:49 AM 09/07/2021    5:46 PM 06/16/2021    6:25 AM  Advanced Directives  Does Patient Have a Medical Advance Directive? Yes Yes Yes Yes Yes Yes No  Type of Estate agent of [place];Living will Healthcare Power of [place];Living will Living will Healthcare Power of [place];Living will Healthcare Power of  [place];Living will    Does patient want to make changes to medical advance directive?  No - Patient declined  No - Patient declined No - Patient declined    Copy of Healthcare Power of Attorney in Chart? No - copy requested Yes - validated most recent copy scanned in chart (See row information)  No - copy requested No - copy requested    Would patient like information on creating a medical advance directive?       No - Patient declined    Current Medications (verified) Outpatient Encounter Medications as of 11/21/2023  Medication Sig   acetaminophen  (TYLENOL ) 500 MG tablet Take 1,000 mg by mouth 2 (two) times daily as needed for moderate pain or headache.   albuterol  (VENTOLIN  HFA) 108 (90 Base) MCG/ACT inhaler Inhale 2 puffs into the lungs every 6 (six) hours as needed for wheezing or shortness of breath.   azelastine  (ASTELIN ) 0.1 % nasal spray Place 1 spray into both nostrils 2 (two) times daily. Use in each nostril as directed   benzonatate  (TESSALON ) 200 MG capsule Take 200 mg by mouth 2 (two) times daily as needed.   cetirizine  (ZYRTEC ) 10 MG tablet Take 1 tablet (10 mg total) by mouth daily.   cyanocobalamin  (VITAMIN B12) 1000 MCG/ML injection Inject 1,000 mcg into the muscle every 30 (thirty) days.   diphenhydrAMINE  (BENADRYL ) 25 MG tablet Take 50 mg by mouth daily as needed for allergies (Asthma).   dupilumab (DUPIXENT) 300 MG/2ML prefilled syringe Inject 300 mg into the skin once.   EPINEPHrine   0.3 mg/0.3 mL IJ SOAJ injection Inject 0.3 mg into the muscle as needed for anaphylaxis.    estradiol  (ESTRACE ) 1 MG tablet Take 1 tablet (1 mg total) by mouth daily.   fluticasone  (FLONASE ) 50 MCG/ACT nasal spray Place 1 spray into both nostrils daily.   Fluticasone -Umeclidin-Vilant (TRELEGY ELLIPTA ) 100-62.5-25 MCG/ACT AEPB Inhale 1 puff into the lungs daily.   furosemide  (LASIX ) 20 MG tablet TAKE 1 TABLET BY MOUTH EVERY DAY AS NEEDED   gabapentin  (NEURONTIN ) 300 MG capsule Take 300 mg by  mouth at bedtime. (Patient taking differently: Take 300 mg by mouth at bedtime. As needed)   ibuprofen  (ADVIL ,MOTRIN ) 200 MG tablet Take 400-800 mg by mouth daily as needed for headache or moderate pain.   ipratropium-albuterol  (DUONEB) 0.5-2.5 (3) MG/3ML SOLN Take 3 mLs by nebulization every 4 (four) hours as needed. Dx 496   montelukast  (SINGULAIR ) 10 MG tablet Take 1 tablet (10 mg total) by mouth at bedtime.   ondansetron  (ZOFRAN -ODT) 4 MG disintegrating tablet Take 1 tablet (4 mg total) by mouth every 6 (six) hours as needed for nausea or vomiting.   pantoprazole  (PROTONIX ) 40 MG tablet TAKE 1 TABLET BY MOUTH TWICE A DAY   Pseudoephedrine HCl (SUDAFED PO) Take 1 tablet by mouth daily as needed (cold symptons).   rizatriptan (MAXALT-MLT) 10 MG disintegrating tablet TAKE 1 TABLET (10 MG TOTAL) BY MOUTH DAILY AS NEEDED FOR MIGRAINE. MAY REPEAT FOR ONE DOSE 2 HOURS AFTER THE FIRST ONE IF NEEDED.   sodium chloride  (OCEAN) 0.65 % SOLN nasal spray Place 1 spray into both nostrils every 4 (four) hours as needed for congestion.    traMADol (ULTRAM) 50 MG tablet Take by mouth.   White Petrolatum-Mineral Oil (LUBRICANT EYE) OINT Place 1 application into both eyes 2 (two) times daily as needed (for dry eyes).    No facility-administered encounter medications on file as of 11/21/2023.    Allergies (verified) Asa [aspirin], Bee venom, Doxycycline , Epinephrine , Feraheme  [ferumoxytol ], Influenza vaccines, Pfizer-biontech covid-19 vacc [covid-19 mrna vacc (moderna)], Quinolones, Shellfish allergy, Celebrex [celecoxib], Ciprofloxacin , Clarithromycin , Erythromycin, Levaquin [levofloxacin in d5w], Peanut-containing drug products, Pineapple, Septra  [sulfamethoxazole -trimethoprim ], Telithromycin, Zanaflex [tizanidine], and Adhesive [tape]   History: Past Medical History:  Diagnosis Date   Achalasia of esophagus    Allergic rhinitis    Allergy    multiple, mostly aspirin, levaquin and shellfish.   Anemia     Anxiety    Arthritis    Asthma    Bilateral leg edema    Cataract 2014   exraction with len implant   Chronic pain syndrome    Chronic pulmonary aspiration    Complication of anesthesia    Breathing problems upon waking up. 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   Compressed cervical disc    COPD (chronic obstructive pulmonary disease) (HCC)    CVA (cerebral infarction)    Dyspnea    Esophageal dysmotility    Gastritis    H/O food anaphylaxis    Heart murmur    as child   History of hiatal hernia    Hypokalemia    IBS (irritable bowel syndrome)    Kidney lesion 2018   Left   Liver lesion    Migraine    PICC (peripherally inserted central catheter) removal 02/20/2017   PONV (postoperative nausea and vomiting)    Problems with swallowing    intermittently   Pulmonary fibrosis (HCC)    Renal cell carcinoma (HCC)    small- left kidney  S/P cervical spinal fusion 01/10/2017   Seizures (HCC)    only in elementary school-was on medication and was tx up until 5th grade and has had no more seizures since then   Shingles    Stress 03/11/2021   Stroke [facility])    slurred speech, drawn face, imaging normal, occurred twice, [facility]-TIA if antything 02/20/17-no residual effects   Tracheal granulation    Tracheostomy in place [facility])    Vocal cord paresis    Past Surgical History:  Procedure Laterality Date   ABDOMINAL HYSTERECTOMY     APPLICATION OF A-CELL OF HEAD/NECK N/A 02/21/2017   Procedure: APPLICATION OF A-CELL OF HEAD/NECK;  Surgeon: Lowery Estefana RAMAN, DO;  Location: [facility] OR;  Service: Plastics;  Laterality: N/A;   BACK SURGERY     BIOPSY  04/06/2022   Procedure: BIOPSY;  Surgeon: Albertus Gordy HERO, MD;  Location[facility] ENDOSCOPY;  Service: Gastroenterology;;   BOTOX  INJECTION N/A 07/29/2013   Procedure: BOTOX  INJECTION;  Surgeon: Gordy HERO Albertus, MD;  Location: [facility] ENDOSCOPY;  Service: Gastroenterology;  Laterality: N/A;   BOTOX  INJECTION N/A 05/04/2015    Procedure: BOTOX  INJECTION;  Surgeon: Gordy HERO Albertus, MD;  Location: [facility] ENDOSCOPY;  Service: Gastroenterology;  Laterality: N/A;   BOTOX  INJECTION N/A 02/18/2018   Procedure: BOTOX  INJECTION;  Surgeon: Albertus Gordy HERO, MD;  Location: [facility] ENDOSCOPY;  Service: Gastroenterology;  Laterality: N/A;   BOTOX  INJECTION N/A 06/30/2019   Procedure: BOTOX  INJECTION;  Surgeon: Albertus Gordy HERO, MD;  Location: [facility] ENDOSCOPY;  Service: Gastroenterology;  Laterality: N/A;   BOTOX  INJECTION N/A 08/05/2020   Procedure: BOTOX  INJECTION;  Surgeon: Albertus Gordy HERO, MD;  Location: [facility] ENDOSCOPY;  Service: Gastroenterology;  Laterality: N/A;   BOTOX  INJECTION N/A 06/16/2021   Procedure: BOTOX  INJECTION;  Surgeon: Albertus Gordy HERO, MD;  Location: [facility] ENDOSCOPY;  Service: Gastroenterology;  Laterality: N/A;   BOTOX  INJECTION N/A 04/06/2022   Procedure: BOTOX  INJECTION;  Surgeon: Albertus Gordy HERO, MD;  Location: [facility] ENDOSCOPY;  Service: Gastroenterology;  Laterality: N/A;   BREAST BIOPSY Left    neg   BREAST SURGERY Left 2002   bx of skin   CATARACT EXTRACTION W/PHACO Right 04/11/2023   Procedure: CATARACT EXTRACTION PHACO AND INTRAOCULAR LENS PLACEMENT (IOC) RIGHT 8.77, 00:36.3;  Surgeon: Mittie Gaskin, MD;  Location[facility];  Service: Ophthalmology;  Laterality: Right;   CHOLECYSTECTOMY     COLONOSCOPY     DIAGNOSTIC LAPAROSCOPY     ESOPHAGEAL MANOMETRY N/A 12/16/2012   Procedure: ESOPHAGEAL MANOMETRY (EM);  Surgeon: Gordy HERO Albertus, MD;  Location: [facility] ENDOSCOPY;  Service: Gastroenterology;  Laterality: N/A;   ESOPHAGOGASTRODUODENOSCOPY (EGD) WITH PROPOFOL  N/A 07/29/2013   Procedure: ESOPHAGOGASTRODUODENOSCOPY (EGD) WITH PROPOFOL ;  Surgeon: Gordy HERO Albertus, MD;  Location: [facility] ENDOSCOPY;  Service: Gastroenterology;  Laterality: N/A;  with botox  injection   ESOPHAGOGASTRODUODENOSCOPY (EGD) WITH PROPOFOL  N/A 05/04/2015   Procedure: ESOPHAGOGASTRODUODENOSCOPY (EGD) WITH PROPOFOL ;  Surgeon: Gordy HERO Albertus, MD;  Location: [facility]  ENDOSCOPY;  Service: Gastroenterology;  Laterality: N/A;   ESOPHAGOGASTRODUODENOSCOPY (EGD) WITH PROPOFOL  N/A 02/18/2018   Procedure: ESOPHAGOGASTRODUODENOSCOPY (EGD) WITH PROPOFOL ;  Surgeon: Albertus Gordy HERO, MD;  Location: [facility] ENDOSCOPY;  Service: Gastroenterology;  Laterality: N/A;   ESOPHAGOGASTRODUODENOSCOPY (EGD) WITH PROPOFOL  N/A 06/30/2019   Procedure: ESOPHAGOGASTRODUODENOSCOPY (EGD) WITH PROPOFOL ;  Surgeon: Albertus Gordy HERO, MD;  Location: [facility] ENDOSCOPY;  Service: Gastroenterology;  Laterality: N/A;   ESOPHAGOGASTRODUODENOSCOPY (EGD) WITH PROPOFOL  N/A 08/05/2020   Procedure: ESOPHAGOGASTRODUODENOSCOPY (EGD) WITH PROPOFOL ;  Surgeon: Albertus Gordy HERO, MD;  Location: [facility]  END[facility];  Service: Gastroenterology;  Laterality: N/A;   ESOPHAGOGASTRODUODENOSCOPY (EGD) WITH PROPOFOL  N/A 06/16/2021   Procedure: ESOPHAGOGASTRODUODENOSCOPY (EGD) WITH PROPOFOL ;  Surgeon: Albertus Gordy HERO, MD;  Location: [facility] ENDOSCOPY;  Service: Gastroenterology;  Laterality: N/A;   ESOPHAGOGASTRODUODENOSCOPY (EGD) WITH PROPOFOL  N/A 04/06/2022   Procedure: ESOPHAGOGASTRODUODENOSCOPY (EGD) WITH PROPOFOL ;  Surgeon: Albertus Gordy HERO, MD;  Location: [facility] ENDOSCOPY;  Service: Gastroenterology;  Laterality: N/A;   EYE SURGERY     Catarct surgery 2014   Eye Surgery AS Child Left    INCISION AND DRAINAGE OF WOUND N/A 02/21/2017   Procedure: IRRIGATION AND DEBRIDEMENT WOUND NECK;  Surgeon: Lowery Estefana RAMAN, DO;  Location: [facility] OR;  Service: Plastics;  Laterality: N/A;   JEJUNOSTOMY FEEDING TUBE     x2 both failed. no longer has   LUMBAR LAMINECTOMY/DECOMPRESSION MICRODISCECTOMY Bilateral 05/13/2018   Procedure: Laminectomy and Foraminotomy - Lumbar four-Lumbar five- bilateral;  Surgeon: Joshua Alm RAMAN, MD;  Location: [facility] OR;  Service: Neurosurgery;  Laterality: Bilateral;   LUMBAR LAMINECTOMY/DECOMPRESSION MICRODISCECTOMY Bilateral 11/08/2020   Procedure: Laminectomy and Foraminotomy - bilateral - Lumbar two-Lumbar three - Lumbar three-Lumbar four;   Surgeon: Joshua Alm RAMAN, MD;  Location: [facility] OR;  Service: Neurosurgery;  Laterality: Bilateral;   MULTIPLE TOOTH EXTRACTIONS     2 teeth removed   OOPHORECTOMY     POSTERIOR CERVICAL FUSION/FORAMINOTOMY N/A 01/10/2017   Procedure: LAMINECTOMY AND FORAMINOTOMY CERVICAL FOUR-CERVICAL FIVE, CERVICAL FIVE-SIX POSTERIOR CERVICAL INSTRUMENT FUSION CERVICAL THREE-CERVICAL SEVEN,CERVICAL LAMINECTOMY CERVICAL THREE-CERVICAL SEVEN.;  Surgeon: Joshua Alm RAMAN, MD;  Location: [facility] OR;  Service: Neurosurgery;  Laterality: N/A;  posterior   TONSILLECTOMY     TRACHEOSTOMY  1996   done at [facility], Dr. Edda   TRACHEOSTOMY REVISION N/A 09/14/2021   Procedure: TRACHEOSTOMA REVISION, TRACHEOPLASTY;  Surgeon: Edda Mt, MD;  Location: [facility] ORS;  Service: ENT;  Laterality: N/A;   TUBAL LIGATION     VIDEO BRONCHOSCOPY Bilateral 11/20/2012   Procedure: VIDEO BRONCHOSCOPY WITH FLUORO;  Surgeon: Vicenta KATHEE Lennert, MD;  Location: [facility] ENDOSCOPY;  Service: Cardiopulmonary;  Laterality: Bilateral;   Family History  Problem Relation Age of Onset   Asthma Cousin    COPD Cousin    Breast cancer Maternal Grandmother 81   Asthma Father    Kidney cancer Father    Arrhythmia Father    Lung cancer Father    Lung cancer Paternal Uncle    COPD Paternal Grandfather    Alcohol  abuse Mother    Breast cancer Maternal Aunt 52   Bladder Cancer Neg Hx    Colon cancer Neg Hx    Esophageal cancer Neg Hx    Pancreatic cancer Neg Hx    Stomach cancer Neg Hx    Liver disease Neg Hx    Social History   Socioeconomic History   Marital status: Married    Spouse name: Not on file   Number of children: 4   Years of education: Not on file   Highest education level: Some college, no degree  Occupational History   Not on file  Tobacco Use   Smoking status: Never   Smokeless tobacco: Never  Vaping Use   Vaping status: Never Used  Substance and Sexual Activity   Alcohol  use: Yes    Comment: occ wine   Drug use: No   Sexual  activity: Yes  Other Topics Concern   Not on file  Social History Narrative   Lives in [place] with husband.  She has four children.   Retired from  [employer]            Social Drivers of Corporate investment banker Strain: Low Risk  (11/21/2023)   Overall Financial Resource Strain (CARDIA)    Difficulty of Paying Living Expenses: Not hard at all  Food Insecurity: No Food Insecurity (11/21/2023)   Hunger Vital Sign    Worried About Running Out of Food in the Last Year: Never true    Ran Out of Food in the Last Year: Never true  Transportation Needs: No Transportation Needs (11/21/2023)   PRAPARE - Administrator, Civil Service (Medical): No    Lack of Transportation (Non-Medical): No  Physical Activity: Sufficiently Active (11/21/2023)   Exercise Vital Sign    Days of Exercise per Week: 7 days    Minutes of Exercise per Session: 30 min  Stress: No Stress Concern Present (11/21/2023)   [employer] of Occupational Health - Occupational Stress Questionnaire    Feeling of Stress: Only a little  Social Connections: Socially Integrated (11/21/2023)   Social Connection and Isolation Panel    Frequency of Communication with Friends and Family: More than three times a week    Frequency of Social Gatherings with Friends and Family: More than three times a week    Attends Religious Services: More than 4 times per year    Active Member of [employer] or Organizations: Yes    Attends Engineer, structural: More than 4 times per year    Marital Status: Married    Tobacco Counseling Counseling given: Not Answered    Clinical Intake:  Pre-visit preparation completed: Yes  Pain : 0-10 Pain Score: 8  Pain Type: Chronic pain Pain Location: Back Pain Descriptors / Indicators: Aching, Nagging Pain Onset: More than a month ago Pain Frequency: Constant     BMI - recorded: 18.13 Nutritional Status: BMI <19  Underweight Nutritional Risks: None Diabetes: No  Lab  Results  Component Value Date   HGBA1C 5.4 12/13/2016   HGBA1C 5.9 (H) 04/30/2013     How often do you need to have someone help you when you read instructions, pamphlets, or other written materials from your doctor or pharmacy?: 1 - Never  Interpreter Needed?: No  Information entered by :: R. Child Campoy LPN   Activities of Daily Living     11/21/2023   10:54 AM 04/11/2023    8:20 AM  In your present state of health, do you have any difficulty performing the following activities:  Hearing? 1 0  Vision? 0 0  Difficulty concentrating or making decisions? 0 0  Walking or climbing stairs? 1   Dressing or bathing? 0   Doing errands, shopping? 0   Preparing Food and eating ? N   Using the Toilet? N   In the past six months, have you accidently leaked urine? N   Do you have problems with loss of bowel control? N   Managing your Medications? N   Managing your Finances? N   Housekeeping or managing your Housekeeping? N     Patient Care Team: Bair, Kalpana, MD as PCP - General (Family Medicine) Darron Deatrice LABOR, MD as PCP - Cardiology (Cardiology) Chauncey Redell Agent, MD as Consulting Physician (Urology) Pyrtle, Gordy HERO, MD as Consulting Physician (Gastroenterology) Tamea Dedra CROME, MD as Consulting Physician (Pulmonary Disease)  I have updated your Care Teams any recent Medical Services you may have received from other providers in the past year.     Assessment:   This is  a routine wellness examination for Ralynn.  Hearing/Vision screen Hearing Screening - Comments:: Wears aids Vision Screening - Comments:: glasses   Goals Addressed             This Visit's Progress    Patient Stated       Wants to continue to be active and learn to play hand bells       Depression Screen     11/21/2023   11:01 AM 09/18/2023   10:08 AM 01/09/2023    9:13 AM 11/14/2022    8:45 AM 09/25/2022   10:05 AM 06/16/2022   10:03 AM 04/11/2022    8:16 AM  PHQ 2/9 Scores  PHQ - 2 Score 0 0 0  0 0 0 0  PHQ- 9 Score 0 2 0 0 0 2     Fall Risk     11/21/2023   10:55 AM 09/18/2023   10:08 AM 01/09/2023    9:13 AM 11/14/2022    8:45 AM 09/25/2022   10:05 AM  Fall Risk   Falls in the past year? 0 0 0 0 0  Number falls in past yr: 0 0 0 0 0  Injury with Fall? 0 0 0 0 0  Risk for fall due to : No Fall Risks No Fall Risks No Fall Risks No Fall Risks No Fall Risks  Follow up Falls evaluation completed;Falls prevention discussed Falls evaluation completed Falls evaluation completed Falls evaluation completed Falls evaluation completed    MEDICARE RISK AT HOME:  Medicare Risk at Home Any stairs in or around the home?: Yes If so, are there any without handrails?: No Home free of loose throw rugs in walkways, pet beds, electrical cords, etc?: Yes Adequate lighting in your home to reduce risk of falls?: Yes Life alert?: No Use of a cane, walker or w/c?: No Grab bars in the bathroom?: No Shower chair or bench in shower?: No Elevated toilet seat or a handicapped toilet?: No  TIMED UP AND GO:  Was the test performed?  No  Cognitive Function: 6CIT completed    10/14/2019    8:57 AM 10/09/2017   10:11 AM  MMSE - Mini Mental State Exam  Not completed: Unable to complete   Orientation to time  5  Orientation to Place  5  Registration  3  Attention/ Calculation  5  Recall  3  Language- name 2 objects  2  Language- repeat  1  Language- follow 3 step command  3  Language- read & follow direction  1  Write a sentence  1  Copy design  1  Total score  30        11/21/2023   11:06 AM 02/02/2022   10:55 AM 10/11/2018    8:53 AM  6CIT Screen  What Year? 0 points 0 points 0 points  What month? 0 points 0 points 0 points  What time? 0 points 0 points 0 points  Count back from 20 0 points 0 points 0 points  Months in reverse 0 points 0 points 0 points  Repeat phrase 0 points 0 points 0 points  Total Score 0 points 0 points 0 points    Immunizations Immunization History   Administered Date(s) Administered   Fluad Quad(high Dose 65+) 02/14/2021   Influenza Inj Mdck Quad Pf 01/15/2019, 01/16/2020, 12/15/2021   Influenza, Mdck, Trivalent,PF 6+ MOS(egg free) 01/23/2023   Influenza, Quadrivalent, Recombinant, Inj, Pf 02/06/2017, 12/19/2017   Influenza,inj,Quad PF,6+ Mos 12/19/2012   Influenza,trivalent, recombinat,  inj, PF 01/05/2014   Janssen (J&J) SARS-COV-2 Vaccination 01/05/2020, 03/10/2020   PFIZER(Purple Top)SARS-COV-2 Vaccination 05/09/2019   Pneumococcal Conjugate-13 01/23/2014   Pneumococcal Polysaccharide-23 03/11/2021   Td 11/07/2021    Screening Tests Health Maintenance  Topic Date Due   Zoster Vaccines- Shingrix (1 of 2) Never done   INFLUENZA VACCINE  10/26/2023   Medicare Annual Wellness (AWV)  11/20/2024   MAMMOGRAM  08/12/2025   Colonoscopy  10/03/2027   DTaP/Tdap/Td (2 - Tdap) 11/08/2031   Pneumococcal Vaccine: 50+ Years  Completed   DEXA SCAN  Completed   Hepatitis C Screening  Completed   HPV VACCINES  Aged Out   Meningococcal B Vaccine  Aged Out   COVID-19 Vaccine  Discontinued    Health Maintenance  Health Maintenance Due  Topic Date Due   Zoster Vaccines- Shingrix (1 of 2) Never done   INFLUENZA VACCINE  10/26/2023   Health Maintenance Items Addressed: Discussed the need to update flu and covid vaccines.  Patient declines shingles vaccines. Patient wants to discuss Dexa with PCP at next visit  Additional Screening:  Vision Screening: Recommended annual ophthalmology exams for early detection of glaucoma and other disorders of the eye. Up to date  Eye Would you like a referral to an eye doctor? No    Dental Screening: Recommended annual dental exams for proper oral hygiene  Community Resource Referral / Chronic Care Management: CRR required this visit?  No   CCM required this visit?  No   Plan:    I have personally reviewed and noted the following in the patient's chart:   Medical and social  history Use of alcohol , tobacco or illicit drugs  Current medications and supplements including opioid prescriptions. Patient is currently taking opioid prescriptions. Information provided to patient regarding non-opioid alternatives. Patient advised to discuss non-opioid treatment plan with their provider. Functional ability and status Nutritional status Physical activity Advanced directives List of other physicians Hospitalizations, surgeries, and ER visits in previous 12 months Vitals Screenings to include cognitive, depression, and falls Referrals and appointments  In addition, I have reviewed and discussed with patient certain preventive protocols, quality metrics, and best practice recommendations. A written personalized care plan for preventive services as well as general preventive health recommendations were provided to patient.   Angeline Fredericks, LPN   [id number]   After Visit Summary: (MyChart) Due to this being a telephonic visit, the after visit summary with patients personalized plan was offered to patient via MyChart   Notes: Nothing significant to report at this time.

## 2023-11-21 NOTE — Patient Instructions (Signed)
 Anne [name] , Thank you for taking time out of your busy schedule to complete your Annual Wellness Visit with me. I enjoyed our conversation and look forward to speaking with you again next year. I, as well as your care team,  appreciate your ongoing commitment to your health goals. Please review the following plan we discussed and let me know if I can assist you in the future. Your Game plan/ To Do List    Referrals: If you haven't heard from the office you've been referred to, please reach out to them at the phone provided.  Remember to update your flu and covid vaccines  Follow up Visits: We will see or speak with you next year for your Next Medicare AWV with our clinical staff8/31/26 @ 8:50 Have you seen your provider in the last 6 months (3 months if uncontrolled diabetes)? Yes  Clinician Recommendations:  Aim for 30 minutes of exercise or brisk walking, 6-8 glasses of water, and 5 servings of fruits and vegetables each day.       This is a list of the screenings recommended for you:  Health Maintenance  Topic Date Due   Zoster (Shingles) Vaccine (1 of 2) Never done   Flu Shot  10/26/2023   Medicare Annual Wellness Visit  11/20/2024   Mammogram  08/12/2025   Colon Cancer Screening  10/03/2027   DTaP/Tdap/Td vaccine (2 - Tdap) 11/08/2031   Pneumococcal Vaccine for age over 12  Completed   DEXA scan (bone density measurement)  Completed   Hepatitis C Screening  Completed   HPV Vaccine  Aged Out   Meningitis B Vaccine  Aged Out   COVID-19 Vaccine  Discontinued    Advanced directives: (Copy Requested) Please bring a copy of your health care power of attorney and living will to the office to be added to your chart at your convenience. You can mail to [facility] [address] [place], [place] [address] or email to [email or web address] Advance Care Planning is important because it:  [x]  Makes sure you receive the medical care that is consistent with your  values, goals, and preferences  [x]  It provides guidance to your family and loved ones and reduces their decisional burden about whether or not they are making the right decisions based on your wishes.  Managing Pain Without Opioids Opioids are strong medicines used to treat moderate to severe pain. For some people, especially those who have long-term (chronic) pain, opioids may not be the best choice for pain management due to: Side effects like nausea, constipation, and sleepiness. The risk of addiction (opioid use disorder). The longer you take opioids, the greater your risk of addiction. Pain that lasts for more than 3 months is called chronic pain. Managing chronic pain usually requires more than one approach and is often provided by a team of health care providers working together (multidisciplinary approach). Pain management may be done at a pain management center or pain clinic. How to manage pain without the use of opioids Use non-opioid medicines Non-opioid medicines for pain may include: Over-the-counter or prescription non-steroidal anti-inflammatory drugs (NSAIDs). These may be the first medicines used for pain. They work well for muscle and bone pain, and they reduce swelling. Acetaminophen . This over-the-counter medicine may work well for milder pain but not swelling. Antidepressants. These may be used to treat chronic pain. A certain type of antidepressant (tricyclics) is often used. These medicines are given in lower doses for pain than when used for  depression. Anticonvulsants. These are usually used to treat seizures but may also reduce nerve (neuropathic) pain. Muscle relaxants. These relieve pain caused by sudden muscle tightening (spasms). You may also use a pain medicine that is applied to the skin as a patch, cream, or gel (topical analgesic), such as a numbing medicine. These may cause fewer side effects than medicines taken by mouth. Do certain therapies as directed Some  therapies can help with pain management. They include: Physical therapy. You will do exercises to gain strength and flexibility. A physical therapist may teach you exercises to move and stretch parts of your body that are weak, stiff, or painful. You can learn these exercises at physical therapy visits and practice them at home. Physical therapy may also involve: Massage. Heat wraps or applying heat or cold to affected areas. Electrical signals that interrupt pain signals (transcutaneous electrical nerve stimulation, TENS). Weak lasers that reduce pain and swelling (low-level laser therapy). Signals from your body that help you learn to regulate pain (biofeedback). Occupational therapy. This helps you to learn ways to function at home and work with less pain. Recreational therapy. This involves trying new activities or hobbies, such as a physical activity or drawing. Mental health therapy, including: Cognitive behavioral therapy (CBT). This helps you learn coping skills for dealing with pain. Acceptance and commitment therapy (ACT) to change the way you think and react to pain. Relaxation therapies, including muscle relaxation exercises and mindfulness-based stress reduction. Pain management counseling. This may be individual, family, or group counseling.  Receive medical treatments Medical treatments for pain management include: Nerve block injections. These may include a pain blocker and anti-inflammatory medicines. You may have injections: Near the spine to relieve chronic back or neck pain. Into joints to relieve back or joint pain. Into nerve areas that supply a painful area to relieve body pain. Into muscles (trigger point injections) to relieve some painful muscle conditions. A medical device placed near your spine to help block pain signals and relieve nerve pain or chronic back pain (spinal cord stimulation device). Acupuncture. Follow these instructions at home Medicines Take  over-the-counter and prescription medicines only as told by your health care provider. If you are taking pain medicine, ask your health care providers about possible side effects to watch out for. Do not drive or use heavy machinery while taking prescription opioid pain medicine. Lifestyle  Do not use drugs or alcohol  to reduce pain. If you drink alcohol , limit how much you have to: 0-1 drink a day for women who are not pregnant. 0-2 drinks a day for men. Know how much alcohol  is in a drink. In the [place] one drink equals one 12 oz bottle of beer (355 mL), one 5 oz glass of wine (148 mL), or one 1 oz glass of hard liquor (44 mL). Do not use any products that contain nicotine or tobacco. These products include cigarettes, chewing tobacco, and vaping devices, such as e-cigarettes. If you need help quitting, ask your health care provider. Eat a healthy diet and maintain a healthy weight. Poor diet and excess weight may make pain worse. Eat foods that are high in fiber. These include fresh fruits and vegetables, whole grains, and beans. Limit foods that are high in fat and processed sugars, such as fried and sweet foods. Exercise regularly. Exercise lowers stress and may help relieve pain. Ask your health care provider what activities and exercises are safe for you. If your health care provider approves, join an exercise class that combines  movement and stress reduction. Examples include yoga and tai chi. Get enough sleep. Lack of sleep may make pain worse. Lower stress as much as possible. Practice stress reduction techniques as told by your therapist. General instructions Work with all your pain management providers to find the treatments that work best for you. You are an important member of your pain management team. There are many things you can do to reduce pain on your own. Consider joining an online or in-person support group for people who have chronic pain. Keep all follow-up visits. This  is important. Where to find more information You can find more information about managing pain without opioids from: American Academy of Pain Medicine: painmed.org Institute for Chronic Pain: instituteforchronicpain.org American Chronic Pain Association: theacpa.org Contact a health care provider if: You have side effects from pain medicine. Your pain gets worse or does not get better with treatments or home therapy. You are struggling with anxiety or depression. Summary Many types of pain can be managed without opioids. Chronic pain may respond better to pain management without opioids. Pain is best managed when you and a team of health care providers work together. Pain management without opioids may include non-opioid medicines, medical treatments, physical therapy, mental health therapy, and lifestyle changes. Tell your health care providers if your pain gets worse or is not being managed well enough. This information is not intended to replace advice given to you by your health care provider. Make sure you discuss any questions you have with your health care provider. Document Revised: 06/23/2020 Document Reviewed: 06/23/2020 Elsevier Patient Education  2024 [employer].

## 2023-12-03 ENCOUNTER — Ambulatory Visit (INDEPENDENT_AMBULATORY_CARE_PROVIDER_SITE_OTHER)

## 2023-12-03 DIAGNOSIS — E538 Deficiency of other specified B group vitamins: Secondary | ICD-10-CM

## 2023-12-03 MED ORDER — CYANOCOBALAMIN 1000 MCG/ML IJ SOLN
1000.0000 ug | Freq: Once | INTRAMUSCULAR | Status: AC
  Administered 2023-12-03: 1000 ug via INTRAMUSCULAR

## 2023-12-03 NOTE — Progress Notes (Signed)
 Pt received B12 injection in Left deltoid muscle. Pt tolerated it well with no complaints or concerns.   PT also requested to get the flu shot vaccine at the next B12 visit

## 2023-12-21 ENCOUNTER — Other Ambulatory Visit: Payer: Self-pay

## 2023-12-21 DIAGNOSIS — N951 Menopausal and female climacteric states: Secondary | ICD-10-CM

## 2023-12-24 ENCOUNTER — Ambulatory Visit

## 2024-01-02 ENCOUNTER — Ambulatory Visit

## 2024-01-02 DIAGNOSIS — E538 Deficiency of other specified B group vitamins: Secondary | ICD-10-CM | POA: Diagnosis not present

## 2024-01-02 DIAGNOSIS — Z23 Encounter for immunization: Secondary | ICD-10-CM

## 2024-01-02 MED ORDER — CYANOCOBALAMIN 1000 MCG/ML IJ SOLN
1000.0000 ug | Freq: Once | INTRAMUSCULAR | Status: AC
  Administered 2024-01-02: 1000 ug via INTRAMUSCULAR

## 2024-01-02 NOTE — Progress Notes (Signed)
 Patient was administered a Flu vaccine in her left deltoid. Patient tolerated the Flu vaccine well.   Patient was administered a B12 injection into her right deltoid. Patient tolerated the B12 injection well.

## 2024-01-07 ENCOUNTER — Ambulatory Visit

## 2024-01-07 ENCOUNTER — Encounter (HOSPITAL_COMMUNITY): Payer: Self-pay | Admitting: Internal Medicine

## 2024-01-07 NOTE — Progress Notes (Signed)
 Attempted to obtain medical history for pre op call via telephone, unable to reach at this time. HIPAA compliant voicemail message left requesting return call to pre surgical testing department.

## 2024-01-08 NOTE — Progress Notes (Signed)
 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 Patient saw her cardiologist last 01/2023 with yearly f/u. Patient saw her pulm MD 11/28/23 at appt she was feeling a little congested, placed on small steroid taper. She says currently feels good, no breathing issues, no cardiac problems.  She reports has had anesthesia since trach removed and went fine.    Anesthesia Review- Yes- okay to proceed

## 2024-01-10 ENCOUNTER — Telehealth: Payer: Self-pay

## 2024-01-10 NOTE — Telephone Encounter (Signed)
 Procedure:EGD Procedure date: 01/14/24 Procedure location: [facility] Arrival Time: 6:30 Spoke with the patient Y/N: Y Any prep concerns? N  Has the patient obtained the prep from the pharmacy ? N Do you have a care partner and transportation: Y Any additional concerns? N

## 2024-01-12 NOTE — Anesthesia Preprocedure Evaluation (Signed)
 Anesthesia Evaluation  Patient identified by MRN, date of birth, ID band Patient awake    Reviewed: Allergy & Precautions, NPO status , Patient's Chart, lab work & pertinent test results  History of Anesthesia Complications (+) PONV and history of anesthetic complications  Airway Mallampati: II  TM Distance: >3 FB Neck ROM: Full    Dental no notable dental hx. (+) Partial Lower, Partial Upper   Pulmonary asthma , COPD,  COPD inhaler   Pulmonary exam normal breath sounds clear to auscultation       Cardiovascular Normal cardiovascular exam Rhythm:Regular Rate:Normal     Neuro/Psych  Headaches, Seizures -,   Anxiety     CVA    GI/Hepatic   Endo/Other    Renal/GU Renal disease     Musculoskeletal  (+) Arthritis ,    Abdominal   Peds  Hematology   Anesthesia Other Findings All: see list  Reproductive/Obstetrics                              Anesthesia Physical Anesthesia Plan  ASA: 2  Anesthesia Plan: MAC   Post-op Pain Management:    Induction:   PONV Risk Score and Plan: Propofol  infusion and Treatment may vary due to age or medical condition  Airway Management Planned: Natural Airway and Nasal Cannula  Additional Equipment: None  Intra-op Plan:   Post-operative Plan:   Informed Consent: I have reviewed the patients History and Physical, chart, labs and discussed the procedure including the risks, benefits and alternatives for the proposed anesthesia with the patient or authorized representative who has indicated his/her understanding and acceptance.       Plan Discussed with:   Anesthesia Plan Comments: (Hyperactive LES for EGD wBotox )         Anesthesia Quick Evaluation

## 2024-01-14 ENCOUNTER — Other Ambulatory Visit: Payer: Self-pay

## 2024-01-14 ENCOUNTER — Encounter (HOSPITAL_COMMUNITY): Payer: Self-pay | Admitting: Internal Medicine

## 2024-01-14 ENCOUNTER — Ambulatory Visit (HOSPITAL_BASED_OUTPATIENT_CLINIC_OR_DEPARTMENT_OTHER): Payer: Self-pay | Admitting: Anesthesiology

## 2024-01-14 ENCOUNTER — Encounter (HOSPITAL_COMMUNITY): Payer: Self-pay | Admitting: Anesthesiology

## 2024-01-14 ENCOUNTER — Encounter (HOSPITAL_COMMUNITY)
Admission: RE | Disposition: A | Discharge status: Home / Self Care | Payer: Self-pay | Source: Home / Self Care | Attending: Internal Medicine

## 2024-01-14 ENCOUNTER — Ambulatory Visit (HOSPITAL_COMMUNITY)
Admission: RE | Admit: 2024-01-14 | Discharge: 2024-01-14 | Disposition: A | Discharge status: Home / Self Care | Attending: Internal Medicine | Admitting: Internal Medicine

## 2024-01-14 DIAGNOSIS — M1990 Unspecified osteoarthritis, unspecified site: Secondary | ICD-10-CM | POA: Insufficient documentation

## 2024-01-14 DIAGNOSIS — J449 Chronic obstructive pulmonary disease, unspecified: Secondary | ICD-10-CM | POA: Diagnosis not present

## 2024-01-14 DIAGNOSIS — K2289 Other specified disease of esophagus: Secondary | ICD-10-CM | POA: Diagnosis not present

## 2024-01-14 DIAGNOSIS — R569 Unspecified convulsions: Secondary | ICD-10-CM

## 2024-01-14 DIAGNOSIS — R1314 Dysphagia, pharyngoesophageal phase: Secondary | ICD-10-CM | POA: Diagnosis present

## 2024-01-14 DIAGNOSIS — Z8673 Personal history of transient ischemic attack (TIA), and cerebral infarction without residual deficits: Secondary | ICD-10-CM | POA: Insufficient documentation

## 2024-01-14 DIAGNOSIS — R1310 Dysphagia, unspecified: Secondary | ICD-10-CM | POA: Diagnosis not present

## 2024-01-14 DIAGNOSIS — Z7951 Long term (current) use of inhaled steroids: Secondary | ICD-10-CM | POA: Insufficient documentation

## 2024-01-14 DIAGNOSIS — J4489 Other specified chronic obstructive pulmonary disease: Secondary | ICD-10-CM | POA: Insufficient documentation

## 2024-01-14 DIAGNOSIS — F419 Anxiety disorder, unspecified: Secondary | ICD-10-CM

## 2024-01-14 DIAGNOSIS — N289 Disorder of kidney and ureter, unspecified: Secondary | ICD-10-CM | POA: Diagnosis not present

## 2024-01-14 DIAGNOSIS — K224 Dyskinesia of esophagus: Secondary | ICD-10-CM | POA: Diagnosis not present

## 2024-01-14 HISTORY — PX: ESOPHAGOGASTRODUODENOSCOPY: SHX5428

## 2024-01-14 HISTORY — PX: BOTOX INJECTION: SHX5754

## 2024-01-14 SURGERY — EGD (ESOPHAGOGASTRODUODENOSCOPY)
Anesthesia: Monitor Anesthesia Care

## 2024-01-14 MED ORDER — SODIUM CHLORIDE (PF) 0.9 % IJ SOLN
INTRAMUSCULAR | Status: DC | PRN
  Administered 2024-01-14: 4 mL via SUBMUCOSAL

## 2024-01-14 MED ORDER — SODIUM CHLORIDE 0.9 % IV SOLN
INTRAVENOUS | Status: AC | PRN
  Administered 2024-01-14: 500 mL via INTRAMUSCULAR

## 2024-01-14 MED ORDER — ONDANSETRON HCL 4 MG/2ML IJ SOLN
INTRAMUSCULAR | Status: DC | PRN
  Administered 2024-01-14: 4 mg via INTRAVENOUS

## 2024-01-14 MED ORDER — SODIUM CHLORIDE 0.9 % IV SOLN: INTRAVENOUS | Status: DC

## 2024-01-14 MED ORDER — PROPOFOL 500 MG/50ML IV EMUL
INTRAVENOUS | Status: AC
  Filled 2024-01-14: qty 50

## 2024-01-14 MED ORDER — AMISULPRIDE (ANTIEMETIC) 5 MG/2ML IV SOLN
INTRAVENOUS | Status: AC
  Filled 2024-01-14: qty 2

## 2024-01-14 MED ORDER — LIDOCAINE HCL (CARDIAC) PF 100 MG/5ML IV SOSY
PREFILLED_SYRINGE | INTRAVENOUS | Status: DC | PRN
  Administered 2024-01-14: 50 mg via INTRAVENOUS

## 2024-01-14 MED ORDER — PROPOFOL 10 MG/ML IV BOLUS
INTRAVENOUS | Status: DC | PRN
Start: 2024-01-14 — End: 2024-01-14
  Administered 2024-01-14: 40 mg via INTRAVENOUS

## 2024-01-14 MED ORDER — ONABOTULINUMTOXINA 100 UNITS IJ SOLR
INTRAMUSCULAR | Status: AC
  Filled 2024-01-14: qty 100

## 2024-01-14 MED ORDER — PROPOFOL 500 MG/50ML IV EMUL
INTRAVENOUS | Status: DC | PRN
  Administered 2024-01-14: 150 ug/kg/min via INTRAVENOUS

## 2024-01-14 NOTE — Discharge Instructions (Signed)

## 2024-01-14 NOTE — Anesthesia Procedure Notes (Signed)
 Procedure Name: MAC Date/Time: 01/14/2024 8:00 AM  Performed by: Buster Catheryn SAUNDERS, CRNAPre-anesthesia Checklist: Patient identified, Emergency Drugs available, Suction available, Patient being monitored and Timeout performed Patient Re-evaluated:Patient Re-evaluated prior to induction Oxygen Delivery Method: Simple face mask Ventilation: Oral airway inserted - appropriate to patient size Placement Confirmation: positive ETCO2

## 2024-01-14 NOTE — Transfer of Care (Signed)
 Immediate Anesthesia Transfer of Care Note  Patient: KIFFANY SCHELLING  Procedure(s) Performed: EGD (ESOPHAGOGASTRODUODENOSCOPY) BOTOX  INJECTION  Patient Location: PACU  Anesthesia Type:MAC  Level of Consciousness: awake, alert , and oriented  Airway & Oxygen Therapy: Patient Spontanous Breathing and Patient connected to face mask oxygen  Post-op Assessment: Report given to RN and Post -op Vital signs reviewed and stable  Post vital signs: Reviewed and stable  Last Vitals:  Vitals Value Taken Time  BP    Temp    Pulse    Resp    SpO2      Last Pain:  Vitals:   01/14/24 0724  TempSrc: Temporal         Complications: No notable events documented.

## 2024-01-14 NOTE — H&P (Signed)
 GASTROENTEROLOGY PROCEDURE H&P NOTE   Primary Care Physician: Bair, Kalpana, MD    Reason for Procedure:  Esophageal dysphagia and hypertensive LES  Plan:    EGD with Botox   Patient is appropriate for endoscopic procedure(s) in the ambulatory ([facility]) setting.  The nature of the procedure, as well as the risks, benefits, and alternatives were carefully and thoroughly reviewed with the patient. Ample time for discussion and questions allowed. The patient understood, was satisfied, and agreed to proceed.     HPI: Anne [name] is a 73 y.o. female who presents for EGD and Botox .  Medical history as below. .  No recent chest pain or shortness of breath.  No abdominal pain today.  Past Medical History:  Diagnosis Date   Achalasia of esophagus    Allergic rhinitis    Allergy    multiple, mostly aspirin, levaquin and shellfish.   Anemia    Anxiety    Arthritis    Asthma    Bilateral leg edema    Cataract 2014   exraction with len implant   Chronic pain syndrome    Chronic pulmonary aspiration    Complication of anesthesia    Breathing problems upon waking up. 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   Compressed cervical disc    COPD (chronic obstructive pulmonary disease) (HCC)    CVA (cerebral infarction)    Dyspnea    Esophageal dysmotility    Gastritis    H/O food anaphylaxis    Heart murmur    as child   History of hiatal hernia    Hypokalemia    IBS (irritable bowel syndrome)    Kidney lesion 2018   Left   Liver lesion    Migraine    PICC (peripherally inserted central catheter) removal 02/20/2017   PONV (postoperative nausea and vomiting)    Problems with swallowing    intermittently   Pulmonary fibrosis (HCC)    Renal cell carcinoma (HCC)    small- left kidney   S/P cervical spinal fusion 01/10/2017   Seizures (HCC)    only in elementary school-was on medication and was tx up until 5th grade and has had no more seizures since  then   Shingles    Stress 03/11/2021   Stroke (HCC)    slurred speech, drawn face, imaging normal, occurred twice, [facility]-TIA if antything 02/20/17-no residual effects   Tracheal granulation    Tracheostomy in place [facility])    Vocal cord paresis     Past Surgical History:  Procedure Laterality Date   ABDOMINAL HYSTERECTOMY     APPLICATION OF A-CELL OF HEAD/NECK N/A 02/21/2017   Procedure: APPLICATION OF A-CELL OF HEAD/NECK;  Surgeon: Lowery Estefana RAMAN, DO;  Location: [facility] OR;  Service: Plastics;  Laterality: N/A;   BACK SURGERY     BIOPSY  04/06/2022   Procedure: BIOPSY;  Surgeon: Albertus Gordy HERO, MD;  Location: [facility] ENDOSCOPY;  Service: Gastroenterology;;   BOTOX  INJECTION N/A 07/29/2013   Procedure: BOTOX  INJECTION;  Surgeon: Gordy HERO Albertus, MD;  Location: [facility] ENDOSCOPY;  Service: Gastroenterology;  Laterality: N/A;   BOTOX  INJECTION N/A 05/04/2015   Procedure: BOTOX  INJECTION;  Surgeon: Gordy HERO Albertus, MD;  Location: [facility] ENDOSCOPY;  Service: Gastroenterology;  Laterality: N/A;   BOTOX  INJECTION N/A 02/18/2018   Procedure: BOTOX  INJECTION;  Surgeon: Albertus Gordy HERO, MD;  Location: [facility] ENDOSCOPY;  Service: Gastroenterology;  Laterality: N/A;   BOTOX  INJECTION N/A 06/30/2019   Procedure: BOTOX  INJECTION;  Surgeon:  Vesta Wheeland, Gordy HERO, MD;  Location: [facility];  Service: Gastroenterology;  Laterality: N/A;   BOTOX  INJECTION N/A 08/05/2020   Procedure: BOTOX  INJECTION;  Surgeon: Albertus Gordy HERO, MD;  Location: [facility] ENDOSCOPY;  Service: Gastroenterology;  Laterality: N/A;   BOTOX  INJECTION N/A 06/16/2021   Procedure: BOTOX  INJECTION;  Surgeon: Albertus Gordy HERO, MD;  Location: [facility] ENDOS[facility];  Service: Gastroenterology;  Laterality: N/A;   BOTOX  INJECTION N/A 04/06/2022   Procedure: BOTOX  INJECTION;  Surgeon: Albertus Gordy HERO, MD;  Location: [facility] ENDOSCOPY;  Service: Gastroenterology;  Laterality: N/A;   BREAST BIOPSY Left    neg   BREAST SURGERY Left 2002   bx of skin   CATARACT EXTRACTION W/PHACO Right 04/11/2023    Procedure: CATARACT EXTRACTION PHACO AND INTRAOCULAR LENS PLACEMENT (IOC) RIGHT 8.77, 00:36.3;  Surgeon: Mittie Gaskin, MD;  Location: [facility];  Service: Ophthalmology;  Laterality: Right;   CHOLECYSTECTOMY     COLONOSCOPY     DIAGNOSTIC LAPAROSCOPY     ESOPHAGEAL MANOMETRY N/A 12/16/2012   Procedure: ESOPHAGEAL MANOMETRY (EM);  Surgeon: Gordy HERO Albertus, MD;  [facility]: [facility];  Service: Gastroenterology;  Laterality: N/A;   ESOPHAGOGASTRODUODENOSCOPY (EGD) WITH PROPOFOL  N/A 07/29/2013   Procedure: ESOPHAGOGASTRODUODENOSCOPY (EGD) WITH PROPOFOL ;  Surgeon: Gordy HERO Albertus, MD;  Location: [facility];  Service: Gastroenterology;  Laterality: N/A;  with botox  injection   ESOPHAGOGASTRODUODENOSCOPY (EGD) WITH PROPOFOL  N/A 05/04/2015   Procedure: ESOPHAGOGASTRODUODENOSCOPY (EGD) WITH PROPOFOL ;  Surgeon: Gordy HERO Albertus, MD;  Location: [facility];  Service: Gastroenterology;  Laterality: N/A;   ESOPHAGOGASTRODUODENOSCOPY (EGD) WITH PROPOFOL  N/A 02/18/2018   Procedure: ESOPHAGOGASTRODUODENOSCOPY (EGD) WITH PROPOFOL ;  Surgeon: Albertus Gordy HERO, MD;  [facility]: [facility];  Service: Gastroenterology;  Laterality: N/A;   ESOPHAGOGASTRODUODENOSCOPY (EGD) WITH PROPOFOL  N/A 06/30/2019   Procedure: ESOPHAGOGASTRODUODENOSCOPY (EGD) WITH PROPOFOL ;  Surgeon: Albertus Gordy HERO, MD;  [facility]: [facility];  Service: Gastroenterology;  Laterality: N/A;   ESOPHAGOGASTRODUODENOSCOPY (EGD) WITH PROPOFOL  N/A 08/05/2020   Procedure: ESOPHAGOGASTRODUODENOSCOPY (EGD) WITH PROPOFOL ;  Surgeon: Albertus Gordy HERO, MD;  [facility]: [facility];  Service: Gastroenterology;  Laterality: N/A;   ESOPHAGOGASTRODUODENOSCOPY (EGD) WITH PROPOFOL  N/A 06/16/2021   Procedure: ESOPHAGOGASTRODUODENOSCOPY (EGD) WITH PROPOFOL ;  Surgeon: Albertus Gordy HERO, MD;  Location: [facility];  Service: Gastroenterology;  Laterality: N/A;   ESOPHAGOGASTRODUODENOSCOPY (EGD) WITH PROPOFOL  N/A 04/06/2022   Procedure: ESOPHAGOGASTRODUODENOSCOPY (EGD)  WITH PROPOFOL ;  Surgeon: Albertus Gordy HERO, MD;  Location[facility] ENDOSCOPY;  Service: Gastroenterology;  Laterality: N/A;   EYE SURGERY     Catarct surgery 2014   Eye Surgery AS Child Left    INCISION AND DRAINAGE OF WOUND N/A 02/21/2017   Procedure: IRRIGATION AND DEBRIDEMENT WOUND NECK;  Surgeon: Lowery Estefana RAMAN, DO;  Location: [facility] OR;  Service: Plastics;  Laterality: N/A;   JEJUNOSTOMY FEEDING TUBE     x2 both failed. no longer has   LUMBAR LAMINECTOMY/DECOMPRESSION MICRODISCECTOMY Bilateral 05/13/2018   Procedure: Laminectomy and Foraminotomy - Lumbar four-Lumbar five- bilateral;  Surgeon: Joshua Alm RAMAN, MD;  Location: [facility] OR;  Service: Neurosurgery;  Laterality: Bilateral;   LUMBAR LAMINECTOMY/DECOMPRESSION MICRODISCECTOMY Bilateral 11/08/2020   Procedure: Laminectomy and Foraminotomy - bilateral - Lumbar two-Lumbar three - Lumbar three-Lumbar four;  Surgeon: Joshua Alm RAMAN, MD;  Location: [facility] OR;  Service: Neurosurgery;  Laterality: Bilateral;   MULTIPLE TOOTH EXTRACTIONS     2 teeth removed   OOPHORECTOMY     POSTERIOR CERVICAL FUSION/FORAMINOTOMY N/A 01/10/2017   Procedure: LAMINECTOMY AND FORAMINOTOMY CERVICAL FOUR-CERVICAL FIVE, CERVICAL FIVE-SIX POSTERIOR CERVICAL INSTRUMENT FUSION CERVICAL THREE-CERVICAL  SEVEN,CERVICAL LAMINECTOMY CERVICAL THREE-CERVICAL SEVEN.;  Surgeon: Joshua Alm RAMAN, MD;  Location: [facility] OR;  Service: Neurosurgery;  Laterality: N/A;  posterior   TONSILLECTOMY     TRACHEOSTOMY  1996   done at [facility], Dr. Edda   TRACHEOSTOMY REVISION N/A 09/14/2021   Procedure: TRACHEOSTOMA REVISION, TRACHEOPLASTY;  Surgeon: Edda Mt, MD;  Location: [facility] ORS;  Service: ENT;  Laterality: N/A;   TUBAL LIGATION     VIDEO BRONCHOSCOPY Bilateral 11/20/2012   Procedure: VIDEO BRONCHOSCOPY WITH FLUORO;  Surgeon: Vicenta KATHEE Lennert, MD;  Location: [facility] ENDOSCOPY;  Service: Cardiopulmonary;  Laterality: Bilateral;    Prior to Admission medications   Medication Sig Start Date End Date  Taking? Authorizing Provider  acetaminophen  (TYLENOL ) 500 MG tablet Take 1,000 mg by mouth 2 (two) times daily as needed for moderate pain or headache.   Yes [provider]  albuterol  (VENTOLIN  HFA) 108 (90 Base) MCG/ACT inhaler Inhale 2 puffs into the lungs every 6 (six) hours as needed for wheezing or shortness of breath. 09/22/21  Yes Tamea Dedra CROME, MD  azelastine  (ASTELIN ) 0.1 % nasal spray Place 1 spray into both nostrils 2 (two) times daily. Use in each nostril as directed 12/04/19  Yes Sood, Vineet, MD  benzonatate  (TESSALON ) 200 MG capsule Take 200 mg by mouth 2 (two) times daily as needed. 04/13/22  Yes [provider]  cetirizine  (ZYRTEC ) 10 MG tablet Take 1 tablet (10 mg total) by mouth daily. 09/18/23  Yes Bair, Kalpana, MD  cyanocobalamin  (VITAMIN B12) 1000 MCG/ML injection Inject 1,000 mcg into the muscle every 30 (thirty) days. 02/13/17  Yes [provider]  diphenhydrAMINE  (BENADRYL ) 25 MG tablet Take 50 mg by mouth daily as needed for allergies (Asthma).   Yes [provider]  dupilumab (DUPIXENT) 300 MG/2ML prefilled syringe Inject 300 mg into the skin once.   Yes [provider]  estradiol  (ESTRACE ) 1 MG tablet TAKE 1 TABLET BY MOUTH EVERY DAY 12/21/23  Yes Bair, Kalpana, MD  fluticasone  (FLONASE ) 50 MCG/ACT nasal spray Place 1 spray into both nostrils daily. 06/25/17  Yes [provider]  furosemide  (LASIX ) 20 MG tablet TAKE 1 TABLET BY MOUTH EVERY DAY AS NEEDED 09/18/23  Yes Bair, Kalpana, MD  ibuprofen  (ADVIL ,MOTRIN ) 200 MG tablet Take 400-800 mg by mouth daily as needed for headache or moderate pain.   Yes [provider]  ipratropium-albuterol  (DUONEB) 0.5-2.5 (3) MG/3ML SOLN Take 3 mLs by nebulization every 4 (four) hours as needed. Dx 496 03/08/17  Yes Linard Alm KATHEE, MD  montelukast  (SINGULAIR ) 10 MG tablet Take 1 tablet (10 mg total) by mouth at bedtime. 09/18/23  Yes Bair, Kalpana, MD  ondansetron  (ZOFRAN -ODT) 4  MG disintegrating tablet Take 1 tablet (4 mg total) by mouth every 6 (six) hours as needed for nausea or vomiting. 09/04/23  Yes Nadir Vasques, Gordy HERO, MD  pantoprazole  (PROTONIX ) 40 MG tablet TAKE 1 TABLET BY MOUTH TWICE A DAY 08/28/23  Yes Beather Delon Gibson, PA  Pseudoephedrine HCl (SUDAFED PO) Take 1 tablet by mouth daily as needed (cold symptons).   Yes [provider]  sodium chloride  (OCEAN) 0.65 % SOLN nasal spray Place 1 spray into both nostrils every 4 (four) hours as needed for congestion.    Yes [provider]  traMADol DANNY) 50 MG tablet Take by mouth. 09/18/23  Yes [provider]  White Petrolatum-Mineral Oil (LUBRICANT EYE) OINT Place 1 application into both eyes 2 (two) times daily as needed (for dry eyes).  Yes [provider]  EPINEPHrine  0.3 mg/0.3 mL IJ SOAJ injection Inject 0.3 mg into the muscle as needed for anaphylaxis.     [provider]  Fluticasone -Umeclidin-Vilant (TRELEGY ELLIPTA ) 100-62.5-25 MCG/ACT AEPB Inhale 1 puff into the lungs daily. 02/17/22   Tamea Dedra CROME, MD  gabapentin  (NEURONTIN ) 300 MG capsule Take 300 mg by mouth at bedtime. Patient taking differently: Take 300 mg by mouth at bedtime. As needed 09/13/23   [provider]  rizatriptan (MAXALT-MLT) 10 MG disintegrating tablet TAKE 1 TABLET (10 MG TOTAL) BY MOUTH DAILY AS NEEDED FOR MIGRAINE. MAY REPEAT FOR ONE DOSE 2 HOURS AFTER THE FIRST ONE IF NEEDED. 06/23/19   Maribeth Camellia MATSU, MD    Current Facility-Administered Medications  Medication Dose Route Frequency Provider Last Rate Last Admin   0.9 %  sodium chloride  infusion   Intravenous Continuous May, Deanna J, NP       0.9 %  sodium chloride  infusion    Continuous PRN Albertus Gordy HERO, MD 10 mL/hr at 01/14/24 0732 500 mL at 01/14/24 0732    Allergies as of 10/19/2023 - Review Complete 09/26/2023  Allergen Reaction Noted   Asa [aspirin] Shortness Of Breath, Swelling, and Other (See Comments)  08/05/2012   Bee venom Anaphylaxis and Shortness Of Breath 02/08/2017   Doxycycline  Hives 09/25/2022   Epinephrine  Shortness Of Breath and Palpitations 10/23/2013   Feraheme  [ferumoxytol ] Shortness Of Breath 10/31/2017   Influenza vaccines Shortness Of Breath and Other (See Comments) 01/14/2013   Pfizer-biontech covid-19 vacc [covid-19 mrna vacc (moderna)] Anaphylaxis 08/19/2019   Quinolones Swelling and Other (See Comments) 07/01/2013   Shellfish allergy Anaphylaxis 08/13/2012   Celebrex [celecoxib] Nausea And Vomiting 08/22/2022   Ciprofloxacin  Swelling and Other (See Comments) 07/01/2013   Clarithromycin  Nausea Only 10/23/2013   Erythromycin Nausea Only 10/23/2013   Levaquin [levofloxacin in d5w] Swelling and Other (See Comments) 08/05/2012   Peanut-containing drug products Other (See Comments) 01/04/2017   Pineapple Swelling 06/16/2022   Septra  [sulfamethoxazole -trimethoprim ] Diarrhea 04/18/2013   Telithromycin Nausea Only 10/23/2013   Zanaflex [tizanidine] Other (See Comments) 02/08/2017   Adhesive [tape] Rash and Other (See Comments) 08/03/2016    Family History  Problem Relation Age of Onset   Asthma Cousin    COPD Cousin    Breast cancer Maternal Grandmother 83   Asthma Father    Kidney cancer Father    Arrhythmia Father    Lung cancer Father    Lung cancer Paternal Uncle    COPD Paternal Grandfather    Alcohol  abuse Mother    Breast cancer Maternal Aunt 60   Bladder Cancer Neg Hx    Colon cancer Neg Hx    Esophageal cancer Neg Hx    Pancreatic cancer Neg Hx    Stomach cancer Neg Hx    Liver disease Neg Hx     Social History   Socioeconomic History   Marital status: Married    Spouse name: Not on file   Number of children: 4   Years of education: Not on file   Highest education level: Some college, no degree  Occupational History   Not on file  Tobacco Use   Smoking status: Never   Smokeless tobacco: Never  Vaping Use   Vaping status: Never Used   Substance and Sexual Activity   Alcohol  use: Yes    Comment: occ wine   Drug use: No   Sexual activity: Yes  Other Topics Concern   Not on file  Social History  Narrative   Lives in [place] with husband.  She has four children.   Retired from [employer] of [employer]: Low Risk  (11/21/2023)   Overall Financial Resource Strain (CARDIA)    Difficulty of Paying Living Expenses: Not hard at all  Food Insecurity: No Food Insecurity (11/21/2023)   Hunger Vital Sign    Worried About Running Out of Food in the Last Year: Never true    Ran Out of Food in the Last Year: Never true  Transportation Needs: No Transportation Needs (11/21/2023)   PRAPARE - Administrator, Civil Service (Medical): No    Lack of Transportation (Non-Medical): No  Physical Activity: Sufficiently Active (11/21/2023)   Exercise Vital Sign    Days of Exercise per Week: 7 days    Minutes of Exercise per Session: 30 min  Stress: No Stress Concern Present (11/21/2023)   [employer] of Occupational Health - Occupational Stress Questionnaire    Feeling of Stress: Only a little  Social Connections: Socially Integrated (11/21/2023)   Social Connection and Isolation Panel    Frequency of Communication with Friends and Family: More than three times a week    Frequency of Social Gatherings with Friends and Family: More than three times a week    Attends Religious Services: More than 4 times per year    Active Member of [employer] or Organizations: Yes    Attends Engineer, structural: More than 4 times per year    Marital Status: Married  Catering manager Violence: Not At Risk (11/21/2023)   Humiliation, Afraid, Rape, and Kick questionnaire    Fear of Current or Ex-Partner: No    Emotionally Abused: No    Physically Abused: No    Sexually Abused: No    Physical Exam: Vital signs in last 24 hours: @BP  (!) 161/70   Pulse 72   Temp 98.3 F (36.8 C)  (Temporal)   Resp (!) 23   Ht 5' 3.5 (1.613 m)   Wt 45.4 kg   SpO2 97%   BMI 17.44 kg/m  GEN: NAD EYE: Sclerae anicteric ENT: MMM CV: Non-tachycardic Pulm: CTA b/l GI: Soft, NT/ND NEURO:  Alert & Oriented x 3   Gordy Starch, MD [facility]  01/14/2024 7:57 AM

## 2024-01-14 NOTE — Anesthesia Postprocedure Evaluation (Signed)
 Anesthesia Post Note  Patient: Anne [name]  Procedure(s) Performed: EGD (ESOPHAGOGASTRODUODENOSCOPY) BOTOX  INJECTION     Patient location during evaluation: Endoscopy Anesthesia Type: MAC Level of consciousness: awake and alert Pain management: pain level controlled Vital Signs Assessment: post-procedure vital signs reviewed and stable Respiratory status: spontaneous breathing, nonlabored ventilation, respiratory function stable and patient connected to nasal cannula oxygen Cardiovascular status: stable and blood pressure returned to baseline Postop Assessment: no apparent nausea or vomiting Anesthetic complications: no   No notable events documented.  Last Vitals:  Vitals:   01/14/24 0840 01/14/24 0850  BP: 135/88 (!) 151/62  Pulse: 60 62  Resp: 11 19  Temp:    SpO2: 100% 100%    Last Pain:  Vitals:   01/14/24 0850  TempSrc:   PainSc: 0-No pain                 [name]

## 2024-01-14 NOTE — Op Note (Signed)
 [facility] Patient Name: Anne [name] Procedure Date: 01/14/2024 MRN: [id number] Attending MD: Gordy CHRISTELLA Starch , MD, [id number] Date of Birth: 1950/08/29 CSN: [id number] Age: 73 Admit Type: Outpatient Procedure:                Upper GI endoscopy Indications:              Dysphagia, hypertensive LES (responsive to BOTOX ,                            last Jan 2024 and March 2023) Providers:                Gordy CHRISTELLA. Starch, MD, Gregoria Pierce, RN, Farris Southgate, Technician Referring MD:             Luke Shade Medicines:                Monitored Anesthesia Care Complications:            No immediate complications. Estimated Blood Loss:     Estimated blood loss: none. Procedure:                Pre-Anesthesia Assessment:                           - Prior to the procedure, a History and Physical                            was performed, and patient medications and                            allergies were reviewed. The patient's tolerance of                            previous anesthesia was also reviewed. The risks                            and benefits of the procedure and the sedation                            options and risks were discussed with the patient.                            All questions were answered, and informed consent                            was obtained. Prior Anticoagulants: The patient has                            taken no anticoagulant or antiplatelet agents. ASA                            Grade Assessment: III - A patient with severe  systemic disease. After reviewing the risks and                            benefits, the patient was deemed in satisfactory                            condition to undergo the procedure.                           After obtaining informed consent, the endoscope was                            passed under direct vision. Throughout the                             procedure, the patient's blood pressure, pulse, and                            oxygen saturations were monitored continuously. The                            GIF-H190 ([id number]) Olympus endoscope was introduced                            through the mouth, and advanced to the second part                            of duodenum. The upper GI endoscopy was                            accomplished without difficulty. The patient                            tolerated the procedure well. Scope In: Scope Out: Findings:      The examined esophagus was normal.      Esophagogastric landmarks were identified: the lower esophageal       sphincter was found at 38 cm from the incisors.      The lower esophageal sphincter was successfully injected in four       quadrants with 100 units botulinum toxin total (25 units in each       quadrant).      The entire examined stomach was normal.      The examined duodenum was normal. Impression:               - Normal esophagus.                           - Normal stomach.                           - Normal examined duodenum.                           - The lower esophageal sphincter successfully  injected with botulinum toxin.                           - No specimens collected. Moderate Sedation:      N/A Recommendation:           - Patient has a contact number available for                            emergencies. The signs and symptoms of potential                            delayed complications were discussed with the                            patient. Return to normal activities tomorrow.                            Written discharge instructions were provided to the                            patient.                           - Resume previous diet.                           - Continue present medications. Procedure Code(s):        --- Professional ---                           [phone], Esophagogastroduodenoscopy, flexible,                             transoral; with directed submucosal injection(s),                            any substance Diagnosis Code(s):        --- Professional ---                           R13.10, Dysphagia, unspecified CPT copyright 2022 American Medical Association. All rights reserved. The codes documented in this report are preliminary and upon coder review may  be revised to meet current compliance requirements. Gordy CHRISTELLA Starch, MD 01/14/2024 8:29:45 AM This report has been signed electronically. Number of Addenda: 0

## 2024-01-16 ENCOUNTER — Encounter (HOSPITAL_COMMUNITY): Payer: Self-pay | Admitting: Internal Medicine

## 2024-02-04 ENCOUNTER — Ambulatory Visit (INDEPENDENT_AMBULATORY_CARE_PROVIDER_SITE_OTHER)

## 2024-02-04 DIAGNOSIS — E538 Deficiency of other specified B group vitamins: Secondary | ICD-10-CM

## 2024-02-04 MED ORDER — CYANOCOBALAMIN 1000 MCG/ML IJ SOLN
1000.0000 ug | Freq: Once | INTRAMUSCULAR | Status: AC
  Administered 2024-02-04: 1000 ug via INTRAMUSCULAR

## 2024-02-04 NOTE — Progress Notes (Signed)
 Patient was administered a B12 injection into her right deltoid. Patient tolerated the B12 injection well.

## 2024-02-10 ENCOUNTER — Other Ambulatory Visit: Payer: Self-pay | Admitting: Physician Assistant

## 2024-02-28 ENCOUNTER — Ambulatory Visit: Attending: Cardiovascular Disease | Admitting: Cardiovascular Disease

## 2024-02-28 ENCOUNTER — Encounter: Payer: Self-pay | Admitting: Cardiovascular Disease

## 2024-02-28 VITALS — BP 118/70 | HR 66 | Ht 63.0 in | Wt 100.5 lb

## 2024-02-28 DIAGNOSIS — I493 Ventricular premature depolarization: Secondary | ICD-10-CM | POA: Diagnosis not present

## 2024-02-28 DIAGNOSIS — I872 Venous insufficiency (chronic) (peripheral): Secondary | ICD-10-CM | POA: Diagnosis not present

## 2024-02-28 DIAGNOSIS — E785 Hyperlipidemia, unspecified: Secondary | ICD-10-CM | POA: Insufficient documentation

## 2024-02-28 DIAGNOSIS — R0609 Other forms of dyspnea: Secondary | ICD-10-CM | POA: Insufficient documentation

## 2024-02-28 NOTE — Patient Instructions (Signed)
 Medication Instructions:  Your physician recommends that you continue on your current medications as directed. Please refer to the Current Medication list given to you today.  *If you need a refill on your cardiac medications before your next appointment, please call your pharmacy*  Lab Work: No labs ordered today   If you have labs (blood work) drawn today and your tests are completely normal, you will receive your results only by: MyChart Message (if you have MyChart) OR A paper copy in the mail If you have any lab test that is abnormal or we need to change your treatment, we will call you to review the results.  Testing/Procedures: No test ordered today    Follow-Up: At [facility], you and your health needs are our priority.  As part of our continuing mission to provide you with exceptional heart care, our providers are all part of one team.  This team includes your primary Cardiologist (physician) and Advanced Practice Providers or APPs (Physician Assistants and Nurse Practitioners) who all work together to provide you with the care you need, when you need it.  Your next appointment:  As Needed     Provider:  Deatrice Cage, MD    We recommend signing up for the patient portal called MyChart.  Sign up information is provided on this After Visit Summary.  MyChart is used to connect with patients for Virtual Visits (Telemedicine).  Patients are able to view lab/test results, encounter notes, upcoming appointments, etc.  Non-urgent messages can be sent to your provider as well.   To learn more about what you can do with MyChart, go to [email or web address].

## 2024-02-28 NOTE — Progress Notes (Signed)
 Cardiology Office Note   Date:  02/28/2024   ID:  Anne [name], DOB November 06, 1950, MRN [id number]  PCP:  Abbey Bruckner, MD  Cardiologist:   Deatrice Cage, MD   Chief Complaint  Patient presents with   Follow-up    12 month f/u c/o palpitations and sob with exertion. Meds reviewed verbally with pt.      History of Present Illness: Anne [name] is a 73 y.o. female who is here today for follow-up visit regarding palpitations.  She is not a smoker and there is no family history of coronary artery disease. She did have possible TIAs in the past was on Plavix  for a while.   She also has chronic nutritional problems requiring tube feeding in the past.   She had outpatient monitor done in 2019 which showed 1 short run of SVT.  She was started on small dose metoprolol .   She had cardiac CTA done in May 2022 which showed calcium  score of 0 with no evidence of coronary artery disease.  Echocardiogram in April 2022 showed normal LV systolic function with no significant valvular abnormalities.  She was treated with small dose Toprol  12.5 mg once daily which was subsequently stopped when her symptoms improved.  She has been doing reasonably well with minimal palpitations and no dizziness.  She has stable shortness of breath.  She had some cramping in the chest that lasted for 5 minutes but no exertional symptoms overall.  Past Medical History:  Diagnosis Date   Achalasia of esophagus    Allergic rhinitis    Allergy    multiple, mostly aspirin, levaquin and shellfish.   Anemia    Anxiety    Arthritis    Asthma    Bilateral leg edema    Cataract 2014   exraction with len implant   Chronic pain syndrome    Chronic pulmonary aspiration    Complication of anesthesia    Breathing problems upon waking up. 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   Compressed cervical disc    COPD (chronic obstructive pulmonary disease) (HCC)    CVA (cerebral infarction)     Dyspnea    Esophageal dysmotility    Gastritis    H/O food anaphylaxis    Heart murmur    as child   History of hiatal hernia    Hypokalemia    IBS (irritable bowel syndrome)    Kidney lesion 2018   Left   Liver lesion    Migraine    PICC (peripherally inserted central catheter) removal 02/20/2017   PONV (postoperative nausea and vomiting)    Problems with swallowing    intermittently   Pulmonary fibrosis (HCC)    Renal cell carcinoma (HCC)    small- left kidney   S/P cervical spinal fusion 01/10/2017   Seizures (HCC)    only in elementary school-was on medication and was tx up until 5th grade and has had no more seizures since then   Shingles    Stress 03/11/2021   Stroke (HCC)    slurred speech, drawn face, imaging normal, occurred twice, [facility]-TIA if antything 02/20/17-no residual effects   Tracheal granulation    Tracheostomy in place [facility])    Vocal cord paresis     Past Surgical History:  Procedure Laterality Date   ABDOMINAL HYSTERECTOMY     APPLICATION OF A-CELL OF HEAD/NECK N/A 02/21/2017   Procedure: APPLICATION OF A-CELL OF HEAD/NECK;  Surgeon: Lowery Estefana RAMAN, DO;  Location:  [facility] OR;  Service: Plastics;  Laterality: N/A;   BACK SURGERY     BIOPSY  04/06/2022   Procedure: BIOPSY;  Surgeon: Albertus Gordy HERO, MD;  Location: [facility] ENDOSCOPY;  Service: Gastroenterology;;   BOTOX  INJECTION N/A 07/29/2013   Procedure: BOTOX  INJECTION;  Surgeon: Gordy HERO Albertus, MD;  Location: [facility] ENDOSCOPY;  Service: Gastroenterology;  Laterality: N/A;   BOTOX  INJECTION N/A 05/04/2015   Procedure: BOTOX  INJECTION;  Surgeon: Gordy HERO Albertus, MD;  Location: [facility] ENDOSCOPY;  Service: Gastroenterology;  Laterality: N/A;   BOTOX  INJECTION N/A 02/18/2018   Procedure: BOTOX  INJECTION;  Surgeon: Albertus Gordy HERO, MD;  Location: [facility] ENDOSCOPY;  Service: Gastroenterology;  Laterality: N/A;   BOTOX  INJECTION N/A 06/30/2019   Procedure: BOTOX  INJECTION;  Surgeon: Albertus Gordy HERO, MD;  Location: [facility] ENDOSCOPY;   Service: Gastroenterology;  Laterality: N/A;   BOTOX  INJECTION N/A 08/05/2020   Procedure: BOTOX  INJECTION;  Surgeon: Albertus Gordy HERO, MD;  Location: [facility] ENDOSCOPY;  Service: Gastroenterology;  Laterality: N/A;   BOTOX  INJECTION N/A 06/16/2021   Procedure: BOTOX  INJECTION;  Surgeon: Albertus Gordy HERO, MD;  Location: [facility] ENDOSCOPY;  Service: Gastroenterology;  Laterality: N/A;   BOTOX  INJECTION N/A 04/06/2022   Procedure: BOTOX  INJECTION;  Surgeon: Albertus Gordy HERO, MD;  Location: [facility] ENDOSCOPY;  Service: Gastroenterology;  Laterality: N/A;   BOTOX  INJECTION N/A 01/14/2024   Procedure: BOTOX  INJECTION;  Surgeon: Albertus Gordy HERO, MD;  Location: [facility] ENDOSCOPY;  Service: Gastroenterology;  Laterality: N/A;   BREAST BIOPSY Left    neg   BREAST SURGERY Left 2002   bx of skin   CATARACT EXTRACTION W/PHACO Right 04/11/2023   Procedure: CATARACT EXTRACTION PHACO AND INTRAOCULAR LENS PLACEMENT (IOC) RIGHT 8.77, 00:36.3;  Surgeon: Mittie Gaskin, MD;  Location: [facility];  Service: Ophthalmology;  Laterality: Right;   CHOLECYSTECTOMY     COLONOSCOPY     DIAGNOSTIC LAPAROSCOPY     ESOPHAGEAL MANOMETRY N/A 12/16/2012   Procedure: ESOPHAGEAL MANOMETRY (EM);  Surgeon: Gordy HERO Albertus, MD;  Location: [facility] ENDOS[facility];  Service: Gastroenterology;  Laterality: N/A;   ESOPHAGOGASTRODUODENOSCOPY N/A 01/14/2024   Procedure: EGD (ESOPHAGOGASTRODUODENOSCOPY);  Surgeon: Albertus Gordy HERO, MD;  Location: [facility];  Service: Gastroenterology;  Laterality: N/A;   ESOPHAGOGASTRODUODENOSCOPY (EGD) WITH PROPOFOL  N/A 07/29/2013   Procedure: ESOPHAGOGASTRODUODENOSCOPY (EGD) WITH PROPOFOL ;  Surgeon: Gordy HERO Albertus, MD;  Location: [facility];  Service: Gastroenterology;  Laterality: N/A;  with botox  injection   ESOPHAGOGASTRODUODENOSCOPY (EGD) WITH PROPOFOL  N/A 05/04/2015   Procedure: ESOPHAGOGASTRODUODENOSCOPY (EGD) WITH PROPOFOL ;  Surgeon: Gordy HERO Albertus, MD;  Location: [facility];  Service: Gastroenterology;  Laterality: N/A;    ESOPHAGOGASTRODUODENOSCOPY (EGD) WITH PROPOFOL  N/A 02/18/2018   Procedure: ESOPHAGOGASTRODUODENOSCOPY (EGD) WITH PROPOFOL ;  Surgeon: Albertus Gordy HERO, MD;  Location: [facility];  Service: Gastroenterology;  Laterality: N/A;   ESOPHAGOGASTRODUODENOSCOPY (EGD) WITH PROPOFOL  N/A 06/30/2019   Procedure: ESOPHAGOGASTRODUODENOSCOPY (EGD) WITH PROPOFOL ;  Surgeon: Albertus Gordy HERO, MD;  Location: [facility];  Service: Gastroenterology;  Laterality: N/A;   ESOPHAGOGASTRODUODENOSCOPY (EGD) WITH PROPOFOL  N/A 08/05/2020   Procedure: ESOPHAGOGASTRODUODENOSCOPY (EGD) WITH PROPOFOL ;  Surgeon: Albertus Gordy HERO, MD;  Location: [facility];  Service: Gastroenterology;  Laterality: N/A;   ESOPHAGOGASTRODUODENOSCOPY (EGD) WITH PROPOFOL  N/A 06/16/2021   Procedure: ESOPHAGOGASTRODUODENOSCOPY (EGD) WITH PROPOFOL ;  Surgeon: Albertus Gordy HERO, MD;  Location: [facility] END[facility];  Service: Gastroenterology;  Laterality: N/A;   ESOPHAGOGASTRODUODENOSCOPY (EGD) WITH PROPOFOL  N/A 04/06/2022   Procedure: ESOPHAGOGASTRODUODENOSCOPY (EGD) WITH PROPOFOL ;  Surgeon: Albertus Gordy HERO, MD;  Location: [facility] ENDOSCOPY;  Service: Gastroenterology;  Laterality: N/A;   EYE SURGERY     Catarct surgery 2014   Eye Surgery AS Child Left    INCISION AND DRAINAGE OF WOUND N/A 02/21/2017   Procedure: IRRIGATION AND DEBRIDEMENT WOUND NECK;  Surgeon: Lowery Estefana RAMAN, DO;  Location: [facility] OR;  Service: Plastics;  Laterality: N/A;   JEJUNOSTOMY FEEDING TUBE     x2 both failed. no longer has   LUMBAR LAMINECTOMY/DECOMPRESSION MICRODISCECTOMY Bilateral 05/13/2018   Procedure: Laminectomy and Foraminotomy - Lumbar four-Lumbar five- bilateral;  Surgeon: Joshua Alm RAMAN, MD;  Location: [facility] OR;  Service: Neurosurgery;  Laterality: Bilateral;   LUMBAR LAMINECTOMY/DECOMPRESSION MICRODISCECTOMY Bilateral 11/08/2020   Procedure: Laminectomy and Foraminotomy - bilateral - Lumbar two-Lumbar three - Lumbar three-Lumbar four;  Surgeon: Joshua Alm RAMAN, MD;  Location: [facility] OR;  Service:  Neurosurgery;  Laterality: Bilateral;   MULTIPLE TOOTH EXTRACTIONS     2 teeth removed   OOPHORECTOMY     POSTERIOR CERVICAL FUSION/FORAMINOTOMY N/A 01/10/2017   Procedure: LAMINECTOMY AND FORAMINOTOMY CERVICAL FOUR-CERVICAL FIVE, CERVICAL FIVE-SIX POSTERIOR CERVICAL INSTRUMENT FUSION CERVICAL THREE-CERVICAL SEVEN,CERVICAL LAMINECTOMY CERVICAL THREE-CERVICAL SEVEN.;  Surgeon: Joshua Alm RAMAN, MD;  Location: [facility] OR;  Service: Neurosurgery;  Laterality: N/A;  posterior   TONSILLECTOMY     TRACHEOSTOMY  1996   done at [facility], Dr. Edda   TRACHEOSTOMY REVISION N/A 09/14/2021   Procedure: TRACHEOSTOMA REVISION, TRACHEOPLASTY;  Surgeon: Edda Mt, MD;  Location: [facility] ORS;  Service: ENT;  Laterality: N/A;   TUBAL LIGATION     VIDEO BRONCHOSCOPY Bilateral 11/20/2012   Procedure: VIDEO BRONCHOSCOPY WITH FLUORO;  Surgeon: Vicenta KATHEE Lennert, MD;  Location: [facility] ENDOSCOPY;  Service: Cardiopulmonary;  Laterality: Bilateral;     Current Outpatient Medications  Medication Sig Dispense Refill   acetaminophen  (TYLENOL ) 500 MG tablet Take 1,000 mg by mouth 2 (two) times daily as needed for moderate pain or headache.     albuterol  (VENTOLIN  HFA) 108 (90 Base) MCG/ACT inhaler Inhale 2 puffs into the lungs every 6 (six) hours as needed for wheezing or shortness of breath. 18 g 3   azelastine  (ASTELIN ) 0.1 % nasal spray Place 1 spray into both nostrils 2 (two) times daily. Use in each nostril as directed 30 mL 6   benzonatate  (TESSALON ) 200 MG capsule Take 200 mg by mouth 2 (two) times daily as needed.     cetirizine  (ZYRTEC ) 10 MG tablet Take 1 tablet (10 mg total) by mouth daily. 90 tablet 3   cyanocobalamin  (VITAMIN B12) 1000 MCG/ML injection Inject 1,000 mcg into the muscle every 30 (thirty) days.     diphenhydrAMINE  (BENADRYL ) 25 MG tablet Take 50 mg by mouth daily as needed for allergies (Asthma).     dupilumab (DUPIXENT) 300 MG/2ML prefilled syringe Inject 300 mg into the skin once. (Patient taking  differently: Inject 300 mg into the skin every 14 (fourteen) days.)     EPINEPHrine  0.3 mg/0.3 mL IJ SOAJ injection Inject 0.3 mg into the muscle as needed for anaphylaxis.      estradiol  (ESTRACE ) 1 MG tablet TAKE 1 TABLET BY MOUTH EVERY DAY 90 tablet 0   fluticasone  (FLONASE ) 50 MCG/ACT nasal spray Place 1 spray into both nostrils daily.     Fluticasone -Umeclidin-Vilant (TRELEGY ELLIPTA ) 100-62.5-25 MCG/ACT AEPB Inhale 1 puff into the lungs daily. (Patient taking differently: Inhale 1 puff into the lungs as needed.) 60 each 0   furosemide  (LASIX ) 20 MG tablet TAKE 1 TABLET BY MOUTH EVERY DAY AS NEEDED 90 tablet 3   ibuprofen  (ADVIL ,MOTRIN ) 200  MG tablet Take 400-800 mg by mouth daily as needed for headache or moderate pain.     ipratropium-albuterol  (DUONEB) 0.5-2.5 (3) MG/3ML SOLN Take 3 mLs by nebulization every 4 (four) hours as needed. Dx 496 120 mL 2   montelukast  (SINGULAIR ) 10 MG tablet Take 1 tablet (10 mg total) by mouth at bedtime. 90 tablet 1   ondansetron  (ZOFRAN -ODT) 4 MG disintegrating tablet Take 1 tablet (4 mg total) by mouth every 6 (six) hours as needed for nausea or vomiting. 30 tablet 1   pantoprazole  (PROTONIX ) 40 MG tablet TAKE 1 TABLET BY MOUTH TWICE A DAY 180 tablet 1   Pseudoephedrine HCl (SUDAFED PO) Take 1 tablet by mouth daily as needed (cold symptons).     rizatriptan (MAXALT-MLT) 10 MG disintegrating tablet TAKE 1 TABLET (10 MG TOTAL) BY MOUTH DAILY AS NEEDED FOR MIGRAINE. MAY REPEAT FOR ONE DOSE 2 HOURS AFTER THE FIRST ONE IF NEEDED. 10 tablet 0   sodium chloride  (OCEAN) 0.65 % SOLN nasal spray Place 1 spray into both nostrils every 4 (four) hours as needed for congestion.      traMADol (ULTRAM) 50 MG tablet Take by mouth. (Patient taking differently: Take 50 mg by mouth daily as needed.)     White Petrolatum-Mineral Oil (LUBRICANT EYE) OINT Place 1 application into both eyes 2 (two) times daily as needed (for dry eyes).      gabapentin  (NEURONTIN ) 300 MG capsule Take  300 mg by mouth at bedtime. (Patient not taking: Reported on 02/28/2024)     No current facility-administered medications for this visit.    Allergies:   Asa [aspirin], Bee venom, Doxycycline , Epinephrine , Feraheme  [ferumoxytol ], Influenza vaccines, Pfizer-biontech covid-19 vacc [covid-19 mrna vacc (moderna)], Quinolones, Shellfish allergy, Celebrex [celecoxib], Ciprofloxacin , Clarithromycin , Erythromycin, Levaquin [levofloxacin in d5w], Peanut-containing drug products, Pineapple, Septra  [sulfamethoxazole -trimethoprim ], Telithromycin, Zanaflex [tizanidine], and Adhesive [tape]    Social History:  The patient  reports that she has never smoked. She has never used smokeless tobacco. She reports current alcohol  use. She reports that she does not use drugs.   Family History:  The patient's family history includes Alcohol  abuse in her mother; Arrhythmia in her father; Asthma in her cousin and father; Breast cancer (age of onset: 39) in her maternal aunt; Breast cancer (age of onset: 77) in her maternal grandmother; COPD in her cousin and paternal grandfather; Kidney cancer in her father; Lung cancer in her father and paternal uncle.    ROS:  Please see the history of present illness.   Otherwise, review of systems are positive for none.   All other systems are reviewed and negative.    PHYSICAL EXAM: VS:  BP 118/70 (BP Location: Left Arm, Patient Position: Sitting, Cuff Size: Normal)   Pulse 66   Ht 5' 3 (1.6 m)   Wt 100 lb 8 oz (45.6 kg)   SpO2 98%   BMI 17.80 kg/m  , BMI Body mass index is 17.8 kg/m. GEN: Well nourished, well developed, in no acute distress  HEENT: normal  Neck: no JVD, carotid bruits, or masses Cardiac: RRR; no murmurs, rubs, or gallops,no edema  Respiratory:  clear to auscultation bilaterally, normal work of breathing GI: soft, nontender, nondistended, + BS MS: no deformity or atrophy  Skin: warm and dry, no rash Neuro:  Strength and sensation are intact Psych:  euthymic mood, full affect   EKG:  EKG is ordered today. The ekg ordered today demonstrates : Normal sinus rhythm Nonspecific ST abnormality When compared with ECG of  21-Feb-2023 10:17, Premature atrial complexes are no longer Present    Recent Labs: 09/18/2023: ALT 14; BUN 16; Creatinine, Ser 1.14; Magnesium  1.9; Potassium 3.7; Sodium 139    Lipid Panel    Component Value Date/Time   CHOL 184 09/06/2021 0856   TRIG 79.0 09/06/2021 0856   HDL 88.10 09/06/2021 0856   CHOLHDL 2 09/06/2021 0856   VLDL 15.8 09/06/2021 0856   LDLCALC 80 09/06/2021 0856   LDLDIRECT 92.7 12/13/2012 1037      Wt Readings from Last 3 Encounters:  02/28/24 100 lb 8 oz (45.6 kg)  01/14/24 100 lb (45.4 kg)  11/21/23 104 lb (47.2 kg)           10/09/2017    1:53 PM  PAD Screen  Previous PAD dx? No  Previous surgical procedure? No  Pain with walking? Yes  Subsides with rest? No  Feet/toe relief with dangling? No  Painful, non-healing ulcers? No  Extremities discolored? Yes      ASSESSMENT AND PLAN:  1.  Exertional dyspnea : Seems to be noncardiac given negative work-up including echocardiogram and cardiac CTA.  2.  Chronic venous insufficiency: She has no significant swelling today.  I asked her to use Lasix  only as needed.  3.  Palpitations due to PVCs: Not requiring a beta-blocker anymore and no evidence of premature beats by physical exam or EKG today.   Disposition:   FU as needed.  Signed,  Deatrice Cage, MD  02/28/2024 9:09 AM    [facility]

## 2024-03-10 ENCOUNTER — Ambulatory Visit

## 2024-03-10 DIAGNOSIS — E538 Deficiency of other specified B group vitamins: Secondary | ICD-10-CM

## 2024-03-10 DIAGNOSIS — R21 Rash and other nonspecific skin eruption: Secondary | ICD-10-CM

## 2024-03-10 MED ORDER — PREDNISONE 20 MG PO TABS: 20.0000 mg | ORAL_TABLET | Freq: Every day | ORAL | 0 refills | Status: AC

## 2024-03-10 MED ORDER — CYANOCOBALAMIN 1000 MCG/ML IJ SOLN
1000.0000 ug | Freq: Once | INTRAMUSCULAR | Status: AC
  Administered 2024-03-10: 10:00:00 1000 ug via INTRAMUSCULAR

## 2024-03-10 NOTE — Progress Notes (Signed)
 1. B12 deficiency (Primary) - cyanocobalamin  (VITAMIN B12) injection 1,000 mcg  2. Rash and nonspecific skin eruption - predniSONE  (DELTASONE ) 20 MG tablet; Take 1 tablet (20 mg total) by mouth daily with breakfast for 5 days.  Dispense: 5 tablet; Refill: 0   Luke Shade, MD

## 2024-03-10 NOTE — Addendum Note (Signed)
 Addended by: Tatijana Bierly on: 03/10/2024 10:15 AM   Modules accepted: Orders

## 2024-03-10 NOTE — Progress Notes (Signed)
 Pt presented for their vitamin B12 injection. Pt was identified through two identifiers. Pt tolerated shot well in their right deltoid.

## 2024-03-17 ENCOUNTER — Ambulatory Visit

## 2024-03-17 VITALS — BP 128/66 | HR 75 | Temp 97.5°F | Ht 63.5 in | Wt 100.0 lb

## 2024-03-17 DIAGNOSIS — J383 Other diseases of vocal cords: Secondary | ICD-10-CM | POA: Diagnosis not present

## 2024-03-17 DIAGNOSIS — Z148 Genetic carrier of other disease: Secondary | ICD-10-CM

## 2024-03-17 DIAGNOSIS — R21 Rash and other nonspecific skin eruption: Secondary | ICD-10-CM | POA: Diagnosis not present

## 2024-03-17 DIAGNOSIS — M545 Low back pain, unspecified: Secondary | ICD-10-CM | POA: Diagnosis not present

## 2024-03-17 DIAGNOSIS — N951 Menopausal and female climacteric states: Secondary | ICD-10-CM

## 2024-03-17 DIAGNOSIS — R748 Abnormal levels of other serum enzymes: Secondary | ICD-10-CM

## 2024-03-17 DIAGNOSIS — R1319 Other dysphagia: Secondary | ICD-10-CM

## 2024-03-17 DIAGNOSIS — N1831 Chronic kidney disease, stage 3a: Secondary | ICD-10-CM

## 2024-03-17 DIAGNOSIS — Z9889 Other specified postprocedural states: Secondary | ICD-10-CM

## 2024-03-17 DIAGNOSIS — N289 Disorder of kidney and ureter, unspecified: Secondary | ICD-10-CM | POA: Diagnosis not present

## 2024-03-17 DIAGNOSIS — J4489 Other specified chronic obstructive pulmonary disease: Secondary | ICD-10-CM | POA: Diagnosis not present

## 2024-03-17 DIAGNOSIS — G43009 Migraine without aura, not intractable, without status migrainosus: Secondary | ICD-10-CM

## 2024-03-17 DIAGNOSIS — G8929 Other chronic pain: Secondary | ICD-10-CM | POA: Diagnosis not present

## 2024-03-17 DIAGNOSIS — E538 Deficiency of other specified B group vitamins: Secondary | ICD-10-CM | POA: Diagnosis not present

## 2024-03-17 DIAGNOSIS — E790 Hyperuricemia without signs of inflammatory arthritis and tophaceous disease: Secondary | ICD-10-CM | POA: Diagnosis not present

## 2024-03-17 MED ORDER — ESTRADIOL 1 MG PO TABS: 1.0000 mg | ORAL_TABLET | Freq: Every day | ORAL | 3 refills | Status: AC

## 2024-03-17 MED ORDER — MONTELUKAST SODIUM 10 MG PO TABS: 10.0000 mg | ORAL_TABLET | Freq: Every day | ORAL | 3 refills | Status: AC

## 2024-03-17 MED ORDER — PREDNISONE 10 MG PO TABS: 10.0000 mg | ORAL_TABLET | Freq: Every day | ORAL | 0 refills | Status: AC

## 2024-03-17 MED ORDER — CLOBETASOL PROPIONATE 0.05 % EX OINT: 1.0000 | TOPICAL_OINTMENT | Freq: Two times a day (BID) | CUTANEOUS | 1 refills | Status: AC | PRN

## 2024-03-17 NOTE — Assessment & Plan Note (Addendum)
 Acute skin eruption post-MRI dye exposure with recurrent symptoms of persistent pruritic macular rash.  Differential includes delayed allergic reaction or drug eruption. Itching severity 7-8/10. Risk of skin thinning and bleeding with prolonged steroid use discussed.  Prescribed prednisone  10 mg daily for 7 days. Prescribed clobetasol  ointment twice daily for 7 days post-prednisone  to help with itching.  Referred to dermatology for urgent evaluation and potential biopsy. Advised wearing gloves at night to prevent scratching. Recommended icing the affected area to reduce itching. Advised avoiding deep soaking baths.  Orders:   predniSONE  (DELTASONE ) 10 MG tablet; Take 1 tablet (10 mg total) by mouth daily with breakfast for 7 days.   clobetasol  ointment (TEMOVATE ) 0.05 %; Apply 1 Application topically 2 (two) times daily as needed.   Ambulatory referral to Dermatology

## 2024-03-17 NOTE — Assessment & Plan Note (Addendum)
 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

## 2024-03-17 NOTE — Assessment & Plan Note (Addendum)
 With lumbar radiculitis, continue follow up with Dr. Avanell at [facility] for pain management. ContinueTylenol twice daily as needed, Tramadol 50 mg 1/2 tablet sparingly for breakthrough pain, Gabapentin  300 mg at bedtime, voltaren gel 4 times a day prn, continue home exercise, epidural injection as recommended by Dr. Avanell.

## 2024-03-17 NOTE — Assessment & Plan Note (Addendum)
 Chronic issue. Not needing Maxalt 10 mg. Symptoms stable with over the counter medication.

## 2024-03-17 NOTE — Assessment & Plan Note (Addendum)
 She follows up with Dr. Aleskerov at [facility] pulmonology, she is on Dupixent injections every two weeks. On budesonide  nebulization once daily, singular 10 mg daily. Continue follow up with pulmonology at [facility].  Orders:   montelukast  (SINGULAIR ) 10 MG tablet; Take 1 tablet (10 mg total) by mouth at bedtime.

## 2024-03-17 NOTE — Assessment & Plan Note (Addendum)
 Chronic, continue estradiol  1 mg daily. Refill sent.  Orders:   estradiol  (ESTRACE ) 1 MG tablet; Take 1 tablet (1 mg total) by mouth daily.

## 2024-03-17 NOTE — Assessment & Plan Note (Addendum)
 Chronic, S/p ENT evaluation  EGD with botox  on 01/14/24 with Dr. Albertus

## 2024-03-17 NOTE — Assessment & Plan Note (Addendum)
 She has been getting B12 IM injection 1000 mcg monthly for known h/o B12 deficiency. Repeat B12 level and check CBC.  Orders:   CBC w/Diff; Future   B12; Future

## 2024-03-17 NOTE — Assessment & Plan Note (Addendum)
 She was found to have elevated alkaline phosphatase during lab at [facility] on 01/22/24, recommend repeat CMP for evaluation into liver enzymes.  Orders:   Comp Met (CMET); Future

## 2024-03-17 NOTE — Assessment & Plan Note (Addendum)
 This is chronic. Continue plan per s/p lumbar laminectomy

## 2024-03-17 NOTE — Assessment & Plan Note (Addendum)
 Noted to have mild elevation in uric acid without history of gout on lab from 01/22/24. Given CKD, mild elevation in UA, referral to nephrology made today.   Orders:   Ambulatory referral to Nephrology

## 2024-03-17 NOTE — Progress Notes (Addendum)
 "  Established Patient Office Visit   Subjective  Patient ID: Anne [name], female    DOB: December 07, 1950  Age: 73 y.o. MRN: [id number]  Chief Complaint  Patient presents with   Rash   HPI:  - 03/10/24: Prednisone  20 mg for 5 days for erythematous, pruritic macular lesions on her back, chest, lower legs that started after exposure to MRI contrast. Initially her symptoms were better but not fully resolved with Prednisone  20 mg for 5 days. She continues to have pruritus which has been bothersome. She has seen  still continues to have pruritic rash refer her to dermatology or allergy specialist.  The rash severity is rated as a 7 or 8 out of 10 in terms of itchiness. She has been taking Benadryl , which has caused dryness, and uses Zyrtec  nightly.   - Vitamin B12 deficiency history on B12 injection x monthly 1000 mcg. Last injection on 03/10/24. Last B12 618 on 08/17/2020.   - Left renal mass noted: An incidental 1.2 cm left upper pole renal mass was identified in 09/2016 and followed with serial MRIs through 2022, showing slow growth to ?1.9 cm with imaging features variably concerning for RCC vs AML and no evidence of metastatic disease. The mass was treated with cryoablation on 02/07/21, and all subsequent MRIs through 01/03/23 show expected post-ablation changes with no local recurrence or metastatic disease on MRI in 10/16/2016.  She follows up with [facility] for this, her last visit was on 03/05/24 and repeat MRI caused patient to have above rash.    - Saw cardiologist Dr. Darron on 02/28/24. She has a h/o SVT in 2019, treatment with Toprol  12.5 mg daily. No longer needing BB. Stable shortness of breath, cardiac workup including echo, cardiac CTA reassuring and suspected non-cardiac in nature. F/U as needed was recommended.   - Chronic venous insufficiency, on Lasix  prn as recommended by cardiology.   - Lumbar stenosis with neurogenic claudication, herniated nucleus pulposus (lumbar), lumbar  radiculitis:  She sees Dr. Avanell at [facility] for this. Her last visit was on 02/13/24. She has had epidural steroid injections,  L2-3 and L3-4 laminectomy, C3-C7 fusion, physical therapy in the past. She had left L5-S1 and left S1 transforaminal ESI on 01/24/2024 with Dr. Avanell. She is recommended to take Tylenol  twice daily as needed, Tramadol 50 mg 1/2 tablet sparingly for breakthrough pain, Gabapentin  300 mg at bedtime, voltaren gel 4 times a day prn, continue home exercise, and has appointment for epidural injection on 04/25/2024 with Dr. Avanell.   - Undifferentiated inflammatory arthritis, OA of multiple joints: She also follows up with [facility] rheumatologist Dr. Tobie, last visit was on 01/22/24, she was suspected to have inflammatory arthritis and was started on Plaquenil 200 mg daily, she was recommended to follow up with him on 03/23/24.  She stopped Plaquenil as she did not find this to help with arthritic symptoms.   - Vocal cord dysfunction:  S/p ENT evaluation  EGD with botox  on 01/14/24 with Dr. Albertus.   - Postmenopausal on estradiol  1 mg daily.   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 Parris at [facility] pulmonology, she is on Dupixent injections every two weeks. On budesonide  nebulization once daily to reduce oral steroid burden. On Singular 10 mg daily.  - Has a h/o migraine headache but not needing rizatriptan recently.   - She maintains an active lifestyle, engaging in volunteer work and family activities, and  emphasizes the importance of staying active and finding joy in life.     ROS As per HPI    Objective:     BP 128/66 (BP Location: Right Arm, Patient Position: Sitting, Cuff Size: Small)   Pulse 75   Temp (!) 97.5 F (36.4 C) (Oral)   Ht 5' 3.5 (1.613 m)   Wt 100 lb (45.4 kg)   SpO2 98%   BMI 17.44 kg/m      03/17/2024    4:35 PM 11/21/2023   11:01 AM 09/18/2023   10:08 AM  Depression  screen PHQ 2/9  Decreased Interest 0 0 0  Down, Depressed, Hopeless 0 0 0  PHQ - 2 Score 0 0 0  Altered sleeping 1 0 1  Tired, decreased energy 0 0 1  Change in appetite 0 0 0  Feeling bad or failure about yourself  0 0 0  Trouble concentrating 0 0 0  Moving slowly or fidgety/restless 0 0 0  Suicidal thoughts 0 0 0  PHQ-9 Score 1 0  2   Difficult doing work/chores Not difficult at all Not difficult at all Not difficult at all     Data saved with a previous flowsheet row definition      03/17/2024    4:35 PM 09/18/2023   10:08 AM 01/09/2023    9:13 AM 11/14/2022    8:45 AM  GAD 7 : Generalized Anxiety Score  Nervous, Anxious, on Edge 0 0 0 0  Control/stop worrying 0 0 0 0  Worry too much - different things 0 0 0 0  Trouble relaxing 0 0 0 0  Restless 0 0 0 0  Easily annoyed or irritable 0 0 0 0  Afraid - awful might happen 0 0 0 0  Total GAD 7 Score 0 0 0 0  Anxiety Difficulty Not difficult at all Not difficult at all Not difficult at all Not difficult at all      03/17/2024    4:35 PM 11/21/2023   11:01 AM 09/18/2023   10:08 AM  Depression screen PHQ 2/9  Decreased Interest 0 0 0  Down, Depressed, Hopeless 0 0 0  PHQ - 2 Score 0 0 0  Altered sleeping 1 0 1  Tired, decreased energy 0 0 1  Change in appetite 0 0 0  Feeling bad or failure about yourself  0 0 0  Trouble concentrating 0 0 0  Moving slowly or fidgety/restless 0 0 0  Suicidal thoughts 0 0 0  PHQ-9 Score 1 0  2   Difficult doing work/chores Not difficult at all Not difficult at all Not difficult at all     Data saved with a previous flowsheet row definition      03/17/2024    4:35 PM 09/18/2023   10:08 AM 01/09/2023    9:13 AM 11/14/2022    8:45 AM  GAD 7 : Generalized Anxiety Score  Nervous, Anxious, on Edge 0 0 0 0  Control/stop worrying 0 0 0 0  Worry too much - different things 0 0 0 0  Trouble relaxing 0 0 0 0  Restless 0 0 0 0  Easily annoyed or irritable 0 0 0 0  Afraid - awful might happen  0 0 0 0  Total GAD 7 Score 0 0 0 0  Anxiety Difficulty Not difficult at all Not difficult at all Not difficult at all Not difficult at all   SDOH Screenings   Food Insecurity: No Food Insecurity (  03/16/2024)  Housing: Unknown (03/16/2024)  Transportation Needs: No Transportation Needs (03/16/2024)  Utilities: Not At Risk (01/22/2024)   Received from [facility]  Alcohol  Screen: Low Risk (03/16/2024)  Depression (PHQ2-9): Low Risk (03/17/2024)  Financial Resource Strain: Low Risk (03/16/2024)  Physical Activity: Insufficiently Active (03/16/2024)  Social Connections: Socially Integrated (03/16/2024)  Stress: No Stress Concern Present (03/16/2024)  Tobacco Use: Low Risk (03/17/2024)  Health Literacy: Adequate Health Literacy (11/21/2023)     Physical Exam HENT:     Head: Normocephalic and atraumatic.     Right Ear: Tympanic membrane normal.     Mouth/Throat:     Mouth: Mucous membranes are moist.  Cardiovascular:     Rate and Rhythm: Normal rate.  Pulmonary:     Effort: Pulmonary effort is normal.  Abdominal:     General: Bowel sounds are normal.     Palpations: Abdomen is soft.  Musculoskeletal:     Cervical back: Neck supple. No tenderness.     Right lower leg: No edema.     Left lower leg: No edema.  Skin:    Findings: Rash (sispersed macular rash throughout lower legs and back) present.  Neurological:     Mental Status: She is alert and oriented to person, place, and time.  Psychiatric:        Mood and Affect: Mood normal.        No results found for any visits on 03/17/24.  The ASCVD Risk score (Arnett DK, et al., 2019) failed to calculate for the following reasons:   Risk score cannot be calculated because patient has a medical history suggesting prior/existing ASCVD   * - Cholesterol units were assumed     Assessment & Plan:   Assessment & Plan Rash and nonspecific skin eruption Acute skin eruption post-MRI dye exposure with  recurrent symptoms of persistent pruritic macular rash.  Differential includes delayed allergic reaction or drug eruption. Itching severity 7-8/10. Risk of skin thinning and bleeding with prolonged steroid use discussed.  Prescribed prednisone  10 mg daily for 7 days. Prescribed clobetasol  ointment twice daily for 7 days post-prednisone  to help with itching.  Referred to dermatology for urgent evaluation and potential biopsy. Advised wearing gloves at night to prevent scratching. Recommended icing the affected area to reduce itching. Advised avoiding deep soaking baths.  Orders:   predniSONE  (DELTASONE ) 10 MG tablet; Take 1 tablet (10 mg total) by mouth daily with breakfast for 7 days.   clobetasol  ointment (TEMOVATE ) 0.05 %; Apply 1 Application topically 2 (two) times daily as needed.   Ambulatory referral to Dermatology  Menopausal symptom Chronic, continue estradiol  1 mg daily. Refill sent.  Orders:   estradiol  (ESTRACE ) 1 MG tablet; Take 1 tablet (1 mg total) by mouth daily.  Abnormal liver enzymes She was found to have elevated alkaline phosphatase during lab at [facility] on 01/22/24, recommend repeat CMP for evaluation into liver enzymes.  Orders:   Comp Met (CMET); Future  B12 deficiency She has been getting B12 IM injection 1000 mcg monthly for known h/o B12 deficiency. Repeat B12 level and check CBC.  Orders:   CBC w/Diff; Future   B12; Future  Alpha-1-antitrypsin deficiency carrier Has undergone genetic evaluation and was found to be a carrier.    Migraine without aura and without status migrainosus, not intractable Chronic issue. Not needing Maxalt 10 mg. Symptoms stable with over the counter medication.      Stage 3a chronic kidney disease (HCC) Patient had labs done at [facility]  which showed GFR of 53, cr is within normal range. GFR from 04/16/23 59, GFR as low as 49.49 on 01/12/21.  Recommend nephrology evaluation for CKD. Referral made today. Recommend patient reach out to  our clinic if she does not hear for appointment within couple of weeks.   Orders:   Ambulatory referral to Nephrology  Elevated uric acid in blood Noted to have mild elevation in uric acid without history of gout on lab from 01/22/24. Given CKD, mild elevation in UA, referral to nephrology made today.   Orders:   Ambulatory referral to Nephrology  Vocal cord dysfunction Chronic, S/p ENT evaluation  EGD with botox  on 01/14/24 with Dr. Albertus      S/P lumbar laminectomy With lumbar radiculitis, continue follow up with Dr. Avanell at [facility] for pain management. ContinueTylenol twice daily as needed, Tramadol 50 mg 1/2 tablet sparingly for breakthrough pain, Gabapentin  300 mg at bedtime, voltaren gel 4 times a day prn, continue home exercise, epidural injection as recommended by Dr. Avanell.      Asthma-COPD overlap syndrome (HCC) She follows up with Dr. Aleskerov at [facility] pulmonology, she is on Dupixent injections every two weeks. On budesonide  nebulization once daily, singular 10 mg daily. Continue follow up with pulmonology at [facility].  Orders:   montelukast  (SINGULAIR ) 10 MG tablet; Take 1 tablet (10 mg total) by mouth at bedtime.  Esophageal dysphagia This is chronic and she follows up with gastroenterologist Dr. Albertus. She is also on Protonix  40 mg twice a day.       Chronic bilateral low back pain without sciatica This is chronic. Continue plan per s/p lumbar laminectomy      Kidney lesion An incidental 1.2 cm left upper pole renal mass was identified in 09/2016 and followed with serial MRIs through 2022, showing slow growth to ?1.9 cm with imaging features variably concerning for RCC vs AML and no evidence of metastatic disease. The mass was treated with cryoablation on 02/07/21, and all subsequent MRIs through 01/03/23 show expected post-ablation changes with no local recurrence or metastatic disease on MRI in 10/16/2016.  She follows up with [facility] for this.        Return for Non-fasting labs at patient's convinience, f/u with Dr. Abbey in 6 months .   Luke Abbey, MD "

## 2024-03-17 NOTE — Assessment & Plan Note (Deleted)
 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

## 2024-03-17 NOTE — Assessment & Plan Note (Addendum)
 An incidental 1.2 cm left upper pole renal mass was identified in 09/2016 and followed with serial MRIs through 2022, showing slow growth to ?1.9 cm with imaging features variably concerning for RCC vs AML and no evidence of metastatic disease. The mass was treated with cryoablation on 02/07/21, and all subsequent MRIs through 01/03/23 show expected post-ablation changes with no local recurrence or metastatic disease on MRI in 10/16/2016.  She follows up with [facility] for this.

## 2024-03-17 NOTE — Assessment & Plan Note (Deleted)
  Orders:   montelukast  (SINGULAIR ) 10 MG tablet; Take 1 tablet (10 mg total) by mouth at bedtime.

## 2024-03-17 NOTE — Assessment & Plan Note (Addendum)
 This is chronic and she follows up with gastroenterologist Dr. Albertus. She is also on Protonix  40 mg twice a day.

## 2024-03-17 NOTE — Assessment & Plan Note (Addendum)
 Patient had labs done at [facility] which showed GFR of 53, cr is within normal range. GFR from 04/16/23 59, GFR as low as 49.49 on 01/12/21.  Recommend nephrology evaluation for CKD. Referral made today. Recommend patient reach out to our clinic if she does not hear for appointment within couple of weeks.   Orders:   Ambulatory referral to Nephrology

## 2024-03-18 ENCOUNTER — Other Ambulatory Visit (INDEPENDENT_AMBULATORY_CARE_PROVIDER_SITE_OTHER)

## 2024-03-18 ENCOUNTER — Ambulatory Visit (INDEPENDENT_AMBULATORY_CARE_PROVIDER_SITE_OTHER): Admitting: Dermatology

## 2024-03-18 DIAGNOSIS — T508X5A Adverse effect of diagnostic agents, initial encounter: Secondary | ICD-10-CM | POA: Diagnosis not present

## 2024-03-18 DIAGNOSIS — R21 Rash and other nonspecific skin eruption: Secondary | ICD-10-CM | POA: Diagnosis not present

## 2024-03-18 DIAGNOSIS — R748 Abnormal levels of other serum enzymes: Secondary | ICD-10-CM | POA: Diagnosis not present

## 2024-03-18 DIAGNOSIS — E538 Deficiency of other specified B group vitamins: Secondary | ICD-10-CM | POA: Diagnosis not present

## 2024-03-18 NOTE — Patient Instructions (Signed)
 Topical steroids (such as triamcinolone, fluocinolone, fluocinonide, mometasone, clobetasol , halobetasol, betamethasone, hydrocortisone) can cause thinning and lightening of the skin if they are used for too long in the same area. Your physician has selected the right strength medicine for your problem and area affected on the body. Please use your medication only as directed by your physician to prevent side effects.       Due to recent changes in healthcare laws, you may see results of your pathology and/or laboratory studies on MyChart before the doctors have had a chance to review them. We understand that in some cases there may be results that are confusing or concerning to you. Please understand that not all results are received at the same time and often the doctors may need to interpret multiple results in order to provide you with the best plan of care or course of treatment. Therefore, we ask that you please give us  2 business days to thoroughly review all your results before contacting the office for clarification. Should we see a critical lab result, you will be contacted sooner.   If You Need Anything After Your Visit  If you have any questions or concerns for your doctor, please call our main line at [phone] and press option 4 to reach your doctor's medical assistant. If no one answers, please leave a voicemail as directed and we will return your call as soon as possible. Messages left after 4 pm will be answered the following business day.   You may also send us  a message via MyChart. We typically respond to MyChart messages within 1-2 business days.  For prescription refills, please ask your pharmacy to contact our office. Our fax number is [phone].  If you have an urgent issue when the clinic is closed that cannot wait until the next business day, you can page your doctor at the number below.    Please note that while we do our best to be available for urgent issues  outside of office hours, we are not available 24/7.   If you have an urgent issue and are unable to reach us , you may choose to seek medical care at your doctor's office, retail clinic, urgent care center, or emergency room.  If you have a medical emergency, please immediately call 911 or go to the emergency department.  Pager Numbers  - Dr. Hester: [phone]  - Dr. Jackquline: [phone]  - Dr. Claudene: [phone]   - Dr. Raymund: [phone]  In the event of inclement weather, please call our main line at [phone] for an update on the status of any delays or closures.  Dermatology Medication Tips: Please keep the boxes that topical medications come in in order to help keep track of the instructions about where and how to use these. Pharmacies typically print the medication instructions only on the boxes and not directly on the medication tubes.   If your medication is too expensive, please contact our office at [phone] option 4 or send us  a message through MyChart.   We are unable to tell what your co-pay for medications will be in advance as this is different depending on your insurance coverage. However, we may be able to find a substitute medication at lower cost or fill out paperwork to get insurance to cover a needed medication.   If a prior authorization is required to get your medication covered by your insurance company, please allow us  1-2 business days to complete this process.  Drug prices often vary depending  on where the prescription is filled and some pharmacies may offer cheaper prices.  The website www.goodrx.com contains coupons for medications through different pharmacies. The prices here do not account for what the cost may be with help from insurance (it may be cheaper with your insurance), but the website can give you the price if you did not use any insurance.  - You can print the associated coupon and take it with your prescription to the pharmacy.   - You may also stop by our office during regular business hours and pick up a GoodRx coupon card.  - If you need your prescription sent electronically to a different pharmacy, notify our office through [facility] or by phone at [phone] option 4.     Si Usted Necesita Algo Despus de Su Visita  Tambin puede enviarnos un mensaje a travs de Clinical cytogeneticist. Por lo general respondemos a los mensajes de MyChart en el transcurso de 1 a 2 das hbiles.  Para renovar recetas, por favor pida a su farmacia que se ponga en contacto con nuestra oficina. Randi lakes de fax es [place] [phone].  Si tiene un asunto urgente cuando la clnica est cerrada y que no puede esperar hasta el siguiente da hbil, puede llamar/localizar a su doctor(a) al nmero que aparece a continuacin.   Por favor, tenga en cuenta que aunque hacemos todo lo posible para estar disponibles para asuntos urgentes fuera del horario de [place], no estamos disponibles las 24 horas del da, los 7 [address]  [address] de la [place].   Si tiene un problema urgente y no puede comunicarse con nosotros, puede optar por buscar atencin mdica  en el consultorio de su doctor(a), en una clnica privada, en un centro de atencin urgente o en una sala de emergencias.  Si tiene Engineer, drilling, por favor llame inmediatamente al 911 o vaya a la sala de emergencias.  Nmeros de bper  - Dr. Hester: [phone]  - Dra. Jackquline: [phone]  - Dr. Claudene: [phone]  - Dra. Kitts: [phone]  En caso de inclemencias del [place], por favor llame a nuestra lnea principal al [phone] para una actualizacin sobre el estado de cualquier retraso o cierre.  Consejos para la medicacin en dermatologa: Por favor, guarde las cajas en las que vienen los medicamentos de uso tpico para ayudarle a seguir las instrucciones sobre dnde y cmo usarlos. Las farmacias generalmente imprimen las instrucciones del medicamento slo en las cajas y no  directamente en los tubos del [place].   Si su medicamento es muy caro, por favor, pngase en contacto con landry rieger llamando al [phone] y presione la opcin 4 o envenos un mensaje a travs de Clinical cytogeneticist.   No podemos decirle cul ser su copago por los medicamentos por adelantado ya que esto es diferente dependiendo de la cobertura de su seguro. Sin embargo, es posible que podamos encontrar un medicamento sustituto a Audiological scientist un formulario para que el seguro cubra el medicamento que se considera necesario.   Si se requiere una autorizacin previa para que su compaa de seguros malta su medicamento, por favor permtanos de 1 a 2 das hbiles para completar este proceso.  Los precios de los medicamentos varan con frecuencia dependiendo del Environmental consultant de dnde se surte la receta y alguna farmacias pueden ofrecer precios ms baratos.  El sitio web www.goodrx.com tiene cupones para medicamentos de Health and safety inspector. Los precios aqu no tienen en cuenta lo que podra costar con la ayuda del seguro (puede ser ms  barato con su seguro), pero el sitio web puede darle el precio si no Visual merchandiser.  - Puede imprimir el cupn correspondiente y llevarlo con su receta a la farmacia.  - Tambin puede pasar por nuestra oficina durante el horario de atencin regular y Education officer, museum una tarjeta de cupones de GoodRx.  - Si necesita que su receta se enve electrnicamente a una farmacia diferente, informe a nuestra oficina a travs de MyChart de [place] o por telfono llamando al [phone] y presione la opcin 4.

## 2024-03-18 NOTE — Progress Notes (Signed)
" ° °  Follow Up Visit   Subjective  Anne [name] is a 73 y.o. female who presents for the following: Rash  Rash all over s/p MRI w/contrast on 03/05/24; patient has historically not reacted to contrast dye. She has taken OTC Benadryl  and Prednisone  20mg  daily x5 days from her PCP. Rash improved initially with Prednisone  but returned once the course was completed. Rash is itchy and occasionally painful. Patient restarted Prednisone  10mg  daily yesterday. Patient states that she had mild itching of her throat initially w/o difficulty breathing or swallowing. Patient has had many contrast MRI's in the past without reaction; she has an epi pen but did not use it.  Patient states her eyes were itching initially but this has resolved. Patient denied lesions in her mouth, nose, or groin.   Patient is currently taking Dupixent   The following portions of the chart were reviewed this encounter and updated as appropriate: medications, allergies, medical history  Review of Systems:  No other skin or systemic complaints except as noted in HPI or Assessment and Plan.  Objective  Well appearing patient in no apparent distress; mood and affect are within normal limits.  A focused examination was performed of the following areas: Bilateral upper and lower extremities, abdomen, back  Relevant exam findings are noted in the Assessment and Plan.    Assessment & Plan   ALLERGIC REACTION TO CONTRAST MATERIAL, INITIAL ENCOUNTER    RASH favor allergic drug reaction to contrast media  Exam: scattered erythematous blanchable macules on trunk. Scattered erythematous inflammatory papules on extremities. Non blanching on lower legs  Chronic and persistent condition with duration or expected duration over one year. Condition is bothersome/symptomatic for patient. Currently flared.    Treatment Plan: - clinically consistent with drug eruption. Timing suggests contrast dye as cause. Patient had immediate  scratchiness in the throat after the MRI which is concerning for anaphylaxis. Recommend contacting whoever ordered MRI and asking them to add the specific dye used to allergy list. - rash demonstrates progression from trunk to extremities. Improving on trunk so reassuring that prednisone  is treating reaction. - Start Clobetasol  0.05% ointment apply topically twice daily as needed for itching and rash   - Topical steroids (such as triamcinolone, fluocinolone, fluocinonide, mometasone, clobetasol , halobetasol, betamethasone , hydrocortisone) can cause thinning and lightening of the skin if they are used for too long in the same area. Your physician has selected the right strength medicine for your problem and area affected on the body. Please use your medication only as directed by your physician to prevent side effects.   - Continue Prednisone  until course is completed; patient will call back if rash is not cleared one she completes this prescription.   - Complete blood work ordered by PCP today to rule out systemic involvement   Return if symptoms worsen or fail to improve.  I, Emerick Ege, CMA am acting as scribe for Boneta Sharps, MD.   Documentation: I have reviewed the above documentation for accuracy and completeness, and I agree with the above.  Boneta Sharps, MD  "

## 2024-03-19 ENCOUNTER — Ambulatory Visit: Payer: Self-pay

## 2024-03-19 DIAGNOSIS — R7989 Other specified abnormal findings of blood chemistry: Secondary | ICD-10-CM | POA: Insufficient documentation

## 2024-03-19 LAB — CBC WITH DIFFERENTIAL/PLATELET
Basophils Absolute: 0.1 K/uL (ref 0.0–0.1)
Basophils Relative: 0.8 % (ref 0.0–3.0)
Eosinophils Absolute: 0 K/uL (ref 0.0–0.7)
Eosinophils Relative: 0.2 % (ref 0.0–5.0)
HCT: 39.1 % (ref 36.0–46.0)
Hemoglobin: 13.1 g/dL (ref 12.0–15.0)
Lymphocytes Relative: 8.5 % — ABNORMAL LOW (ref 12.0–46.0)
Lymphs Abs: 0.8 K/uL (ref 0.7–4.0)
MCHC: 33.4 g/dL (ref 30.0–36.0)
MCV: 95.3 fl (ref 78.0–100.0)
Monocytes Absolute: 0.3 K/uL (ref 0.1–1.0)
Monocytes Relative: 3.5 % (ref 3.0–12.0)
Neutro Abs: 7.7 K/uL (ref 1.4–7.7)
Neutrophils Relative %: 87 % — ABNORMAL HIGH (ref 43.0–77.0)
Platelets: 247 K/uL (ref 150.0–400.0)
RBC: 4.1 Mil/uL (ref 3.87–5.11)
RDW: 13.1 % (ref 11.5–15.5)
WBC: 8.9 K/uL (ref 4.0–10.5)

## 2024-03-19 LAB — COMPREHENSIVE METABOLIC PANEL WITH GFR
ALT: 14 U/L (ref 3–35)
AST: 15 U/L (ref 5–37)
Albumin: 4.2 g/dL (ref 3.5–5.2)
Alkaline Phosphatase: 114 U/L (ref 39–117)
BUN: 14 mg/dL (ref 6–23)
CO2: 30 meq/L (ref 19–32)
Calcium: 9.7 mg/dL (ref 8.4–10.5)
Chloride: 101 meq/L (ref 96–112)
Creatinine, Ser: 1.09 mg/dL (ref 0.40–1.20)
GFR: 50.04 mL/min — ABNORMAL LOW
Glucose, Bld: 126 mg/dL — ABNORMAL HIGH (ref 70–99)
Potassium: 3.9 meq/L (ref 3.5–5.1)
Sodium: 139 meq/L (ref 135–145)
Total Bilirubin: 0.5 mg/dL (ref 0.2–1.2)
Total Protein: 6.6 g/dL (ref 6.0–8.3)

## 2024-03-19 LAB — VITAMIN B12: Vitamin B-12: 1112 pg/mL — ABNORMAL HIGH (ref 211–911)

## 2024-03-24 ENCOUNTER — Ambulatory Visit

## 2024-03-25 ENCOUNTER — Encounter: Payer: Self-pay | Admitting: Dermatology

## 2024-03-31 ENCOUNTER — Telehealth: Payer: Self-pay

## 2024-03-31 DIAGNOSIS — E538 Deficiency of other specified B group vitamins: Secondary | ICD-10-CM

## 2024-03-31 NOTE — Telephone Encounter (Signed)
 Copied from CRM [id number]. Topic: General - Other >> Mar 31, 2024  3:50 PM Anne [name] wrote: Reason for CRM: Patient doesn't want to wait untl June for B12 shot. She stated that she can wait a month but not until June because she gets really tired without it. Please call pt to see if that is ok.

## 2024-03-31 NOTE — Telephone Encounter (Signed)
 Patient's appointment has been changed to a lab visit for repeat CBC. Patient does not need B-12 until after next visit with her provider.  Copied from CRM [id number]. Topic: Appointments - Scheduling Inquiry for Clinic >> Mar 31, 2024  3:55 PM Drema MATSU wrote: Reason for CRM: Patient wants to schedule a lab for a repeat CBC on 01/15 instead of the b12. Please call pt.

## 2024-04-01 NOTE — Telephone Encounter (Signed)
 Spoke with patient to make her aware of Dr Graylon recommendations. Patient is scheduled for Lab Only appt on 04/10/24. Advised once those results have been received and reviewed, Dr Abbey will reach out to her with the next recommended steps. Patient verbalized understanding. Patient states she missed a call from [facility]. She did reach back out to their office and has not hear back from them. She states she assumes that is something Dr Abbey still wants her to do. Advised patient she does. She states she will reach out to their office again. Verbalized understanding.

## 2024-04-01 NOTE — Addendum Note (Signed)
 Addended by: Kourtney Montesinos on: 04/01/2024 09:57 AM   Modules accepted: Orders

## 2024-04-01 NOTE — Telephone Encounter (Signed)
 She can get B12 injection every 3 months, I recommend getting serum B12 checked before her next injection. Future lab to repeat B12 has been ordered.   1. B12 deficiency (Primary) - B12; Future   Thank you,  Luke Shade, MD

## 2024-04-02 ENCOUNTER — Other Ambulatory Visit

## 2024-04-10 ENCOUNTER — Ambulatory Visit: Payer: Self-pay

## 2024-04-10 ENCOUNTER — Ambulatory Visit

## 2024-04-10 ENCOUNTER — Other Ambulatory Visit (INDEPENDENT_AMBULATORY_CARE_PROVIDER_SITE_OTHER)

## 2024-04-10 DIAGNOSIS — E538 Deficiency of other specified B group vitamins: Secondary | ICD-10-CM

## 2024-04-10 DIAGNOSIS — R7989 Other specified abnormal findings of blood chemistry: Secondary | ICD-10-CM | POA: Diagnosis not present

## 2024-04-10 LAB — CBC WITH DIFFERENTIAL/PLATELET
Basophils Absolute: 0 K/uL (ref 0.0–0.1)
Basophils Relative: 0.3 % (ref 0.0–3.0)
Eosinophils Absolute: 0 K/uL (ref 0.0–0.7)
Eosinophils Relative: 0.6 % (ref 0.0–5.0)
HCT: 36.5 % (ref 36.0–46.0)
Hemoglobin: 12.4 g/dL (ref 12.0–15.0)
Lymphocytes Relative: 16.3 % (ref 12.0–46.0)
Lymphs Abs: 1.3 K/uL (ref 0.7–4.0)
MCHC: 34 g/dL (ref 30.0–36.0)
MCV: 95.6 fl (ref 78.0–100.0)
Monocytes Absolute: 0.7 K/uL (ref 0.1–1.0)
Monocytes Relative: 8.5 % (ref 3.0–12.0)
Neutro Abs: 5.7 K/uL (ref 1.4–7.7)
Neutrophils Relative %: 74.3 % (ref 43.0–77.0)
Platelets: 256 K/uL (ref 150.0–400.0)
RBC: 3.82 Mil/uL — ABNORMAL LOW (ref 3.87–5.11)
RDW: 13.1 % (ref 11.5–15.5)
WBC: 7.7 K/uL (ref 4.0–10.5)

## 2024-04-10 LAB — VITAMIN B12: Vitamin B-12: 630 pg/mL (ref 211–911)

## 2024-04-11 ENCOUNTER — Telehealth: Payer: Self-pay

## 2024-04-11 NOTE — Telephone Encounter (Signed)
 Noted. Thank you.  Jacklin Mascot, MD

## 2024-04-11 NOTE — Telephone Encounter (Signed)
 Copied from CRM [id number]. Topic: General - Other >> Apr 11, 2024  8:24 AM Pinkey ORN wrote: Reason for CRM: Attention Abbey Bruckner, MD >> Apr 11, 2024  8:25 AM Pinkey ORN wrote: Patient wanted to make you aware that in the past, she had to have iron infusions completed because her iron would drop extremely low. Patient states that may be something you would want to keep an eye on.

## 2024-04-14 ENCOUNTER — Ambulatory Visit

## 2024-04-14 DIAGNOSIS — E538 Deficiency of other specified B group vitamins: Secondary | ICD-10-CM

## 2024-04-14 MED ORDER — CYANOCOBALAMIN 1000 MCG/ML IJ SOLN
1000.0000 ug | Freq: Once | INTRAMUSCULAR | Status: AC
  Administered 2024-04-14: 1000 ug via INTRAMUSCULAR

## 2024-04-14 NOTE — Progress Notes (Signed)
 Pt presented for their vitamin B12 injection. Pt was identified through two identifiers. Pt tolerated shot well in their right deltoid.

## 2024-05-16 ENCOUNTER — Ambulatory Visit

## 2024-09-15 ENCOUNTER — Ambulatory Visit

## 2024-11-24 ENCOUNTER — Ambulatory Visit
